# Patient Record
Sex: Female | Born: 1955 | Race: White | Hispanic: No | Marital: Married | Smoking: Never smoker
Health system: Southern US, Community
[De-identification: ages and names within clinical notes are randomized; demographics above are authoritative.]

## PROBLEM LIST (undated history)

## (undated) DIAGNOSIS — G4730 Sleep apnea, unspecified: Secondary | ICD-10-CM

## (undated) DIAGNOSIS — Q793 Gastroschisis: Secondary | ICD-10-CM

## (undated) DIAGNOSIS — R0600 Dyspnea, unspecified: Secondary | ICD-10-CM

## (undated) DIAGNOSIS — K56609 Unspecified intestinal obstruction, unspecified as to partial versus complete obstruction: Secondary | ICD-10-CM

## (undated) DIAGNOSIS — J8410 Pulmonary fibrosis, unspecified: Secondary | ICD-10-CM

## (undated) DIAGNOSIS — E669 Obesity, unspecified: Secondary | ICD-10-CM

## (undated) DIAGNOSIS — E785 Hyperlipidemia, unspecified: Secondary | ICD-10-CM

## (undated) DIAGNOSIS — M1990 Unspecified osteoarthritis, unspecified site: Secondary | ICD-10-CM

## (undated) DIAGNOSIS — K589 Irritable bowel syndrome without diarrhea: Secondary | ICD-10-CM

## (undated) DIAGNOSIS — F419 Anxiety disorder, unspecified: Secondary | ICD-10-CM

## (undated) DIAGNOSIS — E119 Type 2 diabetes mellitus without complications: Secondary | ICD-10-CM

## (undated) DIAGNOSIS — E039 Hypothyroidism, unspecified: Secondary | ICD-10-CM

## (undated) DIAGNOSIS — Z9989 Dependence on other enabling machines and devices: Secondary | ICD-10-CM

## (undated) DIAGNOSIS — I4891 Unspecified atrial fibrillation: Secondary | ICD-10-CM

## (undated) DIAGNOSIS — K219 Gastro-esophageal reflux disease without esophagitis: Secondary | ICD-10-CM

## (undated) DIAGNOSIS — J189 Pneumonia, unspecified organism: Secondary | ICD-10-CM

## (undated) DIAGNOSIS — G4733 Obstructive sleep apnea (adult) (pediatric): Secondary | ICD-10-CM

## (undated) DIAGNOSIS — I10 Essential (primary) hypertension: Secondary | ICD-10-CM

## (undated) DIAGNOSIS — Z9889 Other specified postprocedural states: Secondary | ICD-10-CM

## (undated) DIAGNOSIS — J45909 Unspecified asthma, uncomplicated: Secondary | ICD-10-CM

## (undated) DIAGNOSIS — R112 Nausea with vomiting, unspecified: Secondary | ICD-10-CM

## (undated) HISTORY — DX: Hyperlipidemia, unspecified: E78.5

## (undated) HISTORY — DX: Essential (primary) hypertension: I10

## (undated) HISTORY — DX: Obesity, unspecified: E66.9

## (undated) HISTORY — DX: Irritable bowel syndrome, unspecified: K58.9

## (undated) HISTORY — DX: Anxiety disorder, unspecified: F41.9

## (undated) HISTORY — DX: Type 2 diabetes mellitus without complications: E11.9

## (undated) HISTORY — DX: Unspecified intestinal obstruction, unspecified as to partial versus complete obstruction: K56.609

## (undated) HISTORY — PX: OTHER SURGICAL HISTORY: SHX169

## (undated) HISTORY — DX: Gastro-esophageal reflux disease without esophagitis: K21.9

## (undated) HISTORY — DX: Gastroschisis: Q79.3

## (undated) HISTORY — DX: Unspecified atrial fibrillation: I48.91

## (undated) HISTORY — DX: Pulmonary fibrosis, unspecified: J84.10

## (undated) HISTORY — DX: Sleep apnea, unspecified: G47.30

## (undated) HISTORY — DX: Morbid (severe) obesity due to excess calories: E66.01

---

## 1981-02-12 DIAGNOSIS — O321XX0 Maternal care for breech presentation, not applicable or unspecified: Secondary | ICD-10-CM

## 1986-02-12 HISTORY — PX: TUBAL LIGATION: SHX77

## 1990-02-12 HISTORY — PX: CHOLECYSTECTOMY: SHX55

## 2004-06-20 ENCOUNTER — Ambulatory Visit: Payer: Self-pay | Admitting: Obstetrics and Gynecology

## 2005-08-28 ENCOUNTER — Ambulatory Visit: Payer: Self-pay | Admitting: Obstetrics and Gynecology

## 2005-12-12 ENCOUNTER — Ambulatory Visit: Payer: Self-pay | Admitting: Endocrinology

## 2006-09-18 ENCOUNTER — Ambulatory Visit: Payer: Self-pay | Admitting: Obstetrics and Gynecology

## 2007-09-30 ENCOUNTER — Ambulatory Visit: Payer: Self-pay | Admitting: Obstetrics and Gynecology

## 2008-07-28 ENCOUNTER — Ambulatory Visit: Payer: Self-pay | Admitting: Internal Medicine

## 2008-09-23 LAB — HM DIABETES EYE EXAM: HM Diabetic Eye Exam: NORMAL

## 2008-11-25 ENCOUNTER — Ambulatory Visit: Payer: Self-pay | Admitting: Unknown Physician Specialty

## 2009-01-12 LAB — HM MAMMOGRAPHY: HM Mammogram: NORMAL

## 2009-02-12 HISTORY — PX: COLONOSCOPY: SHX174

## 2009-03-08 ENCOUNTER — Emergency Department: Payer: Self-pay | Admitting: Emergency Medicine

## 2009-03-09 ENCOUNTER — Inpatient Hospital Stay: Payer: Self-pay | Admitting: Student

## 2009-03-28 ENCOUNTER — Ambulatory Visit: Payer: Self-pay | Admitting: Internal Medicine

## 2009-03-28 ENCOUNTER — Encounter: Payer: Self-pay | Admitting: Internal Medicine

## 2009-04-08 ENCOUNTER — Ambulatory Visit: Payer: Self-pay | Admitting: Internal Medicine

## 2009-04-08 DIAGNOSIS — I10 Essential (primary) hypertension: Secondary | ICD-10-CM | POA: Insufficient documentation

## 2009-04-08 DIAGNOSIS — E118 Type 2 diabetes mellitus with unspecified complications: Secondary | ICD-10-CM | POA: Insufficient documentation

## 2009-04-08 DIAGNOSIS — J82 Pulmonary eosinophilia, not elsewhere classified: Secondary | ICD-10-CM

## 2009-04-08 DIAGNOSIS — E785 Hyperlipidemia, unspecified: Secondary | ICD-10-CM | POA: Insufficient documentation

## 2009-04-08 DIAGNOSIS — G4733 Obstructive sleep apnea (adult) (pediatric): Secondary | ICD-10-CM | POA: Insufficient documentation

## 2009-04-08 DIAGNOSIS — J8289 Other pulmonary eosinophilia, not elsewhere classified: Secondary | ICD-10-CM | POA: Insufficient documentation

## 2009-04-08 LAB — CONVERTED CEMR LAB
Basophils Absolute: 0 10*3/uL (ref 0.0–0.1)
Basophils Relative: 0 % (ref 0.0–3.0)
HCT: 40.3 % (ref 36.0–46.0)
Hemoglobin: 13.4 g/dL (ref 12.0–15.0)
Lymphocytes Relative: 38.5 % (ref 12.0–46.0)
Lymphs Abs: 3.2 10*3/uL (ref 0.7–4.0)
MCHC: 33.3 g/dL (ref 30.0–36.0)
Monocytes Relative: 7.1 % (ref 3.0–12.0)
Neutro Abs: 4.1 10*3/uL (ref 1.4–7.7)
Neutrophils Relative %: 50.9 % (ref 43.0–77.0)
RDW: 14.3 % (ref 11.5–14.6)
Sed Rate: 19 mm/hr (ref 0–22)

## 2009-04-22 ENCOUNTER — Ambulatory Visit: Payer: Self-pay | Admitting: Internal Medicine

## 2009-04-22 ENCOUNTER — Encounter: Payer: Self-pay | Admitting: Internal Medicine

## 2009-04-22 ENCOUNTER — Encounter (INDEPENDENT_AMBULATORY_CARE_PROVIDER_SITE_OTHER): Payer: Self-pay | Admitting: *Deleted

## 2009-04-29 ENCOUNTER — Telehealth (INDEPENDENT_AMBULATORY_CARE_PROVIDER_SITE_OTHER): Payer: Self-pay | Admitting: *Deleted

## 2009-05-17 ENCOUNTER — Ambulatory Visit: Payer: Self-pay | Admitting: Internal Medicine

## 2009-05-23 ENCOUNTER — Ambulatory Visit: Payer: Self-pay | Admitting: Gastroenterology

## 2009-05-27 ENCOUNTER — Emergency Department: Payer: Self-pay | Admitting: Unknown Physician Specialty

## 2009-06-16 ENCOUNTER — Ambulatory Visit: Payer: Self-pay | Admitting: Gastroenterology

## 2009-06-16 LAB — HM COLONOSCOPY

## 2009-06-20 ENCOUNTER — Telehealth: Payer: Self-pay | Admitting: Internal Medicine

## 2009-06-20 ENCOUNTER — Ambulatory Visit: Payer: Self-pay | Admitting: Internal Medicine

## 2009-06-20 ENCOUNTER — Ambulatory Visit: Payer: Self-pay | Admitting: Endocrinology

## 2009-06-20 DIAGNOSIS — F418 Other specified anxiety disorders: Secondary | ICD-10-CM | POA: Insufficient documentation

## 2009-06-20 DIAGNOSIS — E039 Hypothyroidism, unspecified: Secondary | ICD-10-CM | POA: Insufficient documentation

## 2009-06-20 LAB — CONVERTED CEMR LAB
ALT: 38 units/L — ABNORMAL HIGH (ref 0–35)
AST: 30 units/L (ref 0–37)
Alkaline Phosphatase: 62 units/L (ref 39–117)
BUN: 12 mg/dL (ref 6–23)
Bilirubin Urine: NEGATIVE
Bilirubin, Direct: 0.1 mg/dL (ref 0.0–0.3)
CO2: 32 meq/L (ref 19–32)
Cholesterol: 206 mg/dL — ABNORMAL HIGH (ref 0–200)
Creatinine,U: 274 mg/dL
Direct LDL: 139.2 mg/dL
HCT: 41.6 % (ref 36.0–46.0)
HDL: 39.3 mg/dL (ref >39.00)
Hgb A1c MFr Bld: 5.8 % (ref 4.6–6.5)
Lymphocytes Relative: 35.6 % (ref 12.0–46.0)
MCV: 89.8 fL (ref 78.0–100.0)
Microalb Creat Ratio: 0.9 mg/g (ref 0.0–30.0)
Microalb, Ur: 2.6 mg/dL — ABNORMAL HIGH (ref 0.0–1.9)
Monocytes Absolute: 0.4 10*3/uL (ref 0.1–1.0)
Monocytes Relative: 5 % (ref 3.0–12.0)
Neutrophils Relative %: 54.7 % (ref 43.0–77.0)
Platelets: 309 10*3/uL (ref 150.0–400.0)
Potassium: 3.7 meq/L (ref 3.5–5.1)
RBC: 4.63 M/uL (ref 3.87–5.11)
Saturation Ratios: 18.7 % — ABNORMAL LOW (ref 20.0–50.0)
Sodium: 145 meq/L (ref 135–145)
TSH: 1.7 microintl units/mL (ref 0.35–5.50)
Total Bilirubin: 0.6 mg/dL (ref 0.3–1.2)
Total Protein: 7 g/dL (ref 6.0–8.3)
Transferrin: 247.9 mg/dL (ref 212.0–360.0)
Triglycerides: 209 mg/dL — ABNORMAL HIGH (ref 0.0–149.0)
Urine Glucose: NEGATIVE mg/dL
Urobilinogen, UA: 0.2 (ref 0.0–1.0)
WBC: 8.5 10*3/uL (ref 4.5–10.5)

## 2009-06-21 ENCOUNTER — Telehealth: Payer: Self-pay | Admitting: Internal Medicine

## 2009-06-22 ENCOUNTER — Encounter (HOSPITAL_COMMUNITY): Admission: RE | Admit: 2009-06-22 | Discharge: 2009-09-20 | Payer: Self-pay | Admitting: Internal Medicine

## 2009-06-24 ENCOUNTER — Encounter: Payer: Self-pay | Admitting: Internal Medicine

## 2009-06-28 ENCOUNTER — Ambulatory Visit: Payer: Self-pay | Admitting: Internal Medicine

## 2009-06-28 DIAGNOSIS — E538 Deficiency of other specified B group vitamins: Secondary | ICD-10-CM | POA: Insufficient documentation

## 2009-07-12 ENCOUNTER — Ambulatory Visit: Payer: Self-pay | Admitting: Internal Medicine

## 2009-07-26 ENCOUNTER — Ambulatory Visit: Payer: Self-pay | Admitting: Internal Medicine

## 2009-07-28 ENCOUNTER — Ambulatory Visit: Payer: Self-pay | Admitting: Internal Medicine

## 2009-07-28 DIAGNOSIS — N390 Urinary tract infection, site not specified: Secondary | ICD-10-CM | POA: Insufficient documentation

## 2009-07-28 LAB — CONVERTED CEMR LAB
Glucose, Urine, Semiquant: NEGATIVE
Ketones, urine, test strip: NEGATIVE
Protein, U semiquant: 30
Specific Gravity, Urine: 1.015
pH: 6

## 2009-08-03 ENCOUNTER — Telehealth: Payer: Self-pay | Admitting: Internal Medicine

## 2009-08-08 ENCOUNTER — Telehealth: Payer: Self-pay | Admitting: Internal Medicine

## 2009-09-20 ENCOUNTER — Encounter: Payer: Self-pay | Admitting: Internal Medicine

## 2009-09-20 ENCOUNTER — Ambulatory Visit: Payer: Self-pay | Admitting: Internal Medicine

## 2009-09-21 ENCOUNTER — Telehealth: Payer: Self-pay | Admitting: Internal Medicine

## 2009-09-22 ENCOUNTER — Ambulatory Visit: Payer: Self-pay | Admitting: Internal Medicine

## 2009-09-29 ENCOUNTER — Ambulatory Visit: Payer: Self-pay | Admitting: Internal Medicine

## 2009-11-10 ENCOUNTER — Inpatient Hospital Stay: Payer: Self-pay | Admitting: Internal Medicine

## 2009-11-12 LAB — CONVERTED CEMR LAB
Basophils Relative: 0.4 %
Calcium: 9.3 mg/dL
Creatinine, Ser: 0.74 mg/dL
Glucose, Urine, Semiquant: 105
HCT: 37.6 %
Hemoglobin: 12.7 g/dL
Lymphocytes, automated: 34.5 %
Monocytes Relative: 6 %
RBC: 4.16 M/uL
RDW: 14.7 %

## 2009-11-21 ENCOUNTER — Ambulatory Visit: Payer: Self-pay | Admitting: Internal Medicine

## 2009-11-21 DIAGNOSIS — T502X5A Adverse effect of carbonic-anhydrase inhibitors, benzothiadiazides and other diuretics, initial encounter: Secondary | ICD-10-CM

## 2009-11-21 DIAGNOSIS — E876 Hypokalemia: Secondary | ICD-10-CM | POA: Insufficient documentation

## 2009-11-21 LAB — CONVERTED CEMR LAB
BUN: 11 mg/dL (ref 6–23)
CO2: 29 meq/L (ref 19–32)
Chloride: 103 meq/L (ref 96–112)
Glucose, Bld: 133 mg/dL — ABNORMAL HIGH (ref 70–99)
Hgb A1c MFr Bld: 6.4 % (ref 4.6–6.5)
Potassium: 3.5 meq/L (ref 3.5–5.1)
Sodium: 141 meq/L (ref 135–145)

## 2009-11-22 ENCOUNTER — Encounter: Payer: Self-pay | Admitting: Internal Medicine

## 2009-11-23 ENCOUNTER — Encounter: Payer: Self-pay | Admitting: Internal Medicine

## 2009-12-07 ENCOUNTER — Telehealth (INDEPENDENT_AMBULATORY_CARE_PROVIDER_SITE_OTHER): Payer: Self-pay | Admitting: *Deleted

## 2009-12-20 ENCOUNTER — Ambulatory Visit: Payer: Self-pay | Admitting: Internal Medicine

## 2009-12-20 LAB — CONVERTED CEMR LAB
Basophils Relative: 0.5 % (ref 0.0–3.0)
Bilirubin Urine: NEGATIVE
CO2: 33 meq/L — ABNORMAL HIGH (ref 19–32)
Calcium: 10 mg/dL (ref 8.4–10.5)
Eosinophils Absolute: 0.2 10*3/uL (ref 0.0–0.7)
Glucose, Bld: 121 mg/dL — ABNORMAL HIGH (ref 70–99)
Glucose, Urine, Semiquant: NEGATIVE
HCT: 38.4 % (ref 36.0–46.0)
Hemoglobin: 13.3 g/dL (ref 12.0–15.0)
Ketones, urine, test strip: NEGATIVE
Lymphocytes Relative: 40.8 % (ref 12.0–46.0)
Lymphs Abs: 3.1 10*3/uL (ref 0.7–4.0)
MCHC: 34.7 g/dL (ref 30.0–36.0)
Monocytes Relative: 7 % (ref 3.0–12.0)
Neutro Abs: 3.8 10*3/uL (ref 1.4–7.7)
Nitrite: NEGATIVE
Potassium: 3.9 meq/L (ref 3.5–5.1)
Protein, U semiquant: NEGATIVE
RBC: 4.31 M/uL (ref 3.87–5.11)
Sodium: 144 meq/L (ref 135–145)
Total Protein, Urine: NEGATIVE mg/dL
Urine Glucose: NEGATIVE mg/dL
pH: 6 (ref 5.0–8.0)

## 2009-12-21 ENCOUNTER — Telehealth (INDEPENDENT_AMBULATORY_CARE_PROVIDER_SITE_OTHER): Payer: Self-pay | Admitting: *Deleted

## 2009-12-22 ENCOUNTER — Encounter: Payer: Self-pay | Admitting: Internal Medicine

## 2010-01-09 ENCOUNTER — Telehealth: Payer: Self-pay | Admitting: Internal Medicine

## 2010-01-09 ENCOUNTER — Telehealth (INDEPENDENT_AMBULATORY_CARE_PROVIDER_SITE_OTHER): Payer: Self-pay | Admitting: *Deleted

## 2010-01-16 ENCOUNTER — Ambulatory Visit: Payer: Self-pay | Admitting: Specialist

## 2010-01-20 ENCOUNTER — Telehealth (INDEPENDENT_AMBULATORY_CARE_PROVIDER_SITE_OTHER): Payer: Self-pay | Admitting: *Deleted

## 2010-01-25 ENCOUNTER — Ambulatory Visit: Payer: Self-pay | Admitting: Unknown Physician Specialty

## 2010-02-02 ENCOUNTER — Encounter: Payer: Self-pay | Admitting: Internal Medicine

## 2010-02-02 LAB — CONVERTED CEMR LAB
ALT: 42 units/L
AST: 25 units/L
Alkaline Phosphatase: 65 units/L
Glucose, Bld: 181 mg/dL
Total Bilirubin: 0.6 mg/dL

## 2010-02-07 ENCOUNTER — Encounter: Payer: Self-pay | Admitting: Internal Medicine

## 2010-02-07 LAB — CONVERTED CEMR LAB
Eosinophils Relative: 2.7 %
Lymphocytes, automated: 38.3 %
Monocytes Relative: 8.3 %
Neutrophils Relative %: 50.4 %
RDW: 14.4 %
WBC: 7 10*3/uL

## 2010-02-14 ENCOUNTER — Encounter: Payer: Self-pay | Admitting: Internal Medicine

## 2010-03-06 ENCOUNTER — Encounter: Payer: Self-pay | Admitting: Internal Medicine

## 2010-03-16 NOTE — Assessment & Plan Note (Signed)
Summary: NEW ENDO CON/ [employer]/ DM AND THYROID/ SELF REFERRAL/NWS   Vital Signs:  Patient profile:   55 year old female Height:      67 inches (170.18 cm) Weight:      205.25 pounds (93.30 kg) O2 Sat:      93 % on Room air Temp:     98.3 degrees F (36.83 degrees C) oral Pulse rate:   82 / minute BP sitting:   128 / 70  (left arm) Cuff size:   regular  Vitals Entered By: Gardenia Phlegm RMA (Jun 20, 2009 9:32 AM)  O2 Flow:  Room air CC: New Endo: Diabetes and Thyroid/ CF Is Patient Diabetic? Yes   Referring Provider:  Self Primary Provider:  none  CC:  New Endo: Diabetes and Thyroid/ CF.  History of Present Illness: pt states 2 years h/o dm.  she denies knowing of any chronic complications.  she has never been on insulin.  she takes metformin.  she checks cbg's less frequently now, but when she has checked, it is well-controlled.  pt says her diet and exercise are "fair."   symptomatically, pt states 1 month of "excessive" hair loss throughout the head.  no associated rash. she has been on synthroid x approx 15 years.     Current Medications (verified): 1)  Synthroid 75 Mcg Tabs (Levothyroxine Sodium) .Marland Kitchen.. 1 Once Daily 2)  Paxil Cr 25 Mg Xr24h-Tab (Paroxetine Hcl) .Marland Kitchen.. 1 Two Times A Day 3)  Singulair 10 Mg Tabs (Montelukast Sodium) .Marland Kitchen.. 1 Once Daily 4)  Metformin Hcl 500 Mg Tabs (Metformin Hcl) .Marland Kitchen.. 1 Two Times A Day 5)  Hydrochlorothiazide 25 Mg Tabs (Hydrochlorothiazide) .Marland Kitchen.. 1 Once Daily 6)  Xanax 0.25 Mg Tabs (Alprazolam) .Marland Kitchen.. 1 Once Daily As Needed 7)  Ambien 10 Mg Tabs (Zolpidem Tartrate) .Marland Kitchen.. 1 At Bedtime As Needed 8)  Pepcid 20 Mg Tabs (Famotidine) .Marland Kitchen.. 1 At Bedtime 9)  Prilosec Otc 20 Mg Tbec (Omeprazole Magnesium) .... Take  One 30-60 Min Before First Meal of The Day 10)  Wellbutrin Xl 150 Mg Xr24h-Tab (Bupropion Hcl) .... One By Mouth Once Daily  Allergies (verified): 1)  ! Pcn 2)  ! Sulfa  Past History:  Past Medical History: Last updated:  05/23/2009 Diabetes, Type 2 Hypertension Sleep Apnea Hyperlipidemia Pulmonary Fibrosis after Acute Lung Injury................................Marland KitchenWert      - ? CAP  02/2009    - CT chest [place] 03/28/09 c/w PF with baseline cxr nl 03/09/09    - f/u PFT's rec April 22, 2009  Born with [employer], rotated organs abdominal repair until age 45 Anxiety Disorder GERD Irritable Bowel Syndrome Obesity Small Bowel Obstruction  Family History: Reviewed history from 05/23/2009 and no changes required. No FH of Colon Cancer: Lymphoma: Mother Family History of Diabetes: Father, Son no goiter or other thyroid probs type 2 dm: father type 1 dm:  son  Social History: Reviewed history from 05/23/2009 and no changes required. 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 "Tried as a teen". Social ETOH Daily Caffeine Use 2  Review of Systems       The patient complains of depression.         denies blurry vision, headache, n/v, urinary frequency, excessive diaphoresis, depression, menopausal sxs, rhinorrhea, and easy bruising.  she has doe, and leg cramps. she has lost 15 lbs x 4 months, due to a viral illness (hosp x 11 days).  Physical Exam  General:  obese.  no  distress  Head:  head: no deformity eyes: no periorbital swelling, no proptosis external nose and ears are normal mouth: no lesion seen hair on the head is mildly thin Neck:  Supple without thyroid enlargement or tenderness.  Lungs:  Clear to auscultation bilaterally. Normal respiratory effort.  Heart:  Regular rate and rhythm without murmurs or gallops noted. Normal S1,S2.   Abdomen:  abdomen is soft, nontender.  no hepatosplenomegaly.   not distended.  no hernia  Msk:  muscle bulk and strength are grossly normal.  no obvious joint swelling.  gait is normal and steady  Pulses:  dorsalis pedis intact bilat.  no carotid bruit  Extremities:  no deformity.  no ulcer on the feet.   feet are of normal color and temp.  no edema  Neurologic:  cn 2-12 grossly intact.   readily moves all 4's.   sensation is intact to touch on the feet  Skin:  normal texture and temp.  no rash.  not diaphoretic  Cervical Nodes:  No significant adenopathy.  Psych:  Alert and cooperative; normal mood and affect; normal attention span and concentration.   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  please let me know if you wish to be referred to a dietician.  high blood sugar is very risky to your health.  you should see an eye doctor every year. 2)  controlling your blood pressure and cholesterol drastically reduces the damage diabetes does to your body.  this also applies to quitting smoking.  please discuss these with your doctor.  you should take an aspirin every day, unless you have been advised by a doctor not to. 3)  check your blood sugar 1 time a day.  vary the time of day when you check, between before the 3 meals, and at bedtime.  also check if you have symptoms of your blood sugar being too high or too low.  please keep a record of the readings and bring it to your next appointment here.  please call us sooner if you are having low blood sugar episodes. 4)  pending the test results, please continue the same medications for now, and return here in 6 months

## 2010-03-16 NOTE — Assessment & Plan Note (Signed)
Summary: abdominal pain--ch.   History of Present Illness Visit Type: Initial Consult Primary GI MD: Erskine Emery MD [facility] Primary Provider: none Requesting Provider: Christinia Gully, MD Chief Complaint: colon screening due to previous abdominal pain & current intermittant pain History of Present Illness:   Kirsten [name] is a pleasant 55 year old white female referred at the request of Dr. Melvyn Novas for colonoscopy.  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  Since that time she has had occasional postprandial upper abdominal discomfort.  She has no GI complaints including change in bowel habits, melena or hematochezia.  She was recently placed on aciphex and Pepcid for a cough,  with subsequent improvement.   GI Review of Systems    Reports abdominal pain and  bloating.     Location of  Abdominal pain: generalized.    Denies acid reflux, belching, chest pain, dysphagia with liquids, dysphagia with solids, heartburn, loss of appetite, nausea, vomiting, vomiting blood, weight loss, and  weight gain.      Reports change in bowel habits, constipation, diarrhea, and  rectal bleeding.     Denies anal fissure, black tarry stools, diverticulosis, fecal incontinence, heme positive stool, hemorrhoids, irritable bowel syndrome, jaundice, light color stool, liver problems, and  rectal pain.    Current Medications (verified): 1)  Synthroid 75 Mcg Tabs (Levothyroxine Sodium) .Marland Kitchen.. 1 Once Daily 2)  Paxil Cr 25 Mg Xr24h-Tab (Paroxetine Hcl) .Marland Kitchen.. 1 Two Times A Day 3)  Singulair 10 Mg Tabs (Montelukast Sodium) .Marland Kitchen.. 1 Once Daily 4)  Metformin Hcl 500 Mg Tabs (Metformin Hcl) .Marland Kitchen.. 1 Two Times A Day 5)  Hydrochlorothiazide 25 Mg Tabs (Hydrochlorothiazide) .Marland Kitchen.. 1 Once Daily 6)  Xopenex Hfa 45 Mcg/act Aero (Levalbuterol Tartrate) .... 2 Puffs Every 6 Hours If Needed 7)  Xanax 0.25 Mg Tabs (Alprazolam) .Marland Kitchen.. 1 Once Daily As Needed 8)  Ambien 10 Mg  Tabs (Zolpidem Tartrate) .Marland Kitchen.. 1 At Bedtime As Needed 9)  Pepcid 20 Mg Tabs (Famotidine) .Marland Kitchen.. 1 At Bedtime 10)  Prilosec Otc 20 Mg Tbec (Omeprazole Magnesium) .... Take  One 30-60 Min Before First Meal of The Day 11)  Doxycycline Hyclate 100 Mg Caps (Doxycycline Hyclate) .... One Twice Daily Before Eating With Glass of Water 12)  Prednisone 10 Mg  Tabs (Prednisone) .... 4 Each Am X 2days, 2x2days, 1x2days and Stop  Allergies (verified): 1)  ! Pcn 2)  ! Sulfa  Past History:  Past Medical History: Diabetes, Type 2 Hypertension Sleep Apnea Hyperlipidemia Pulmonary Fibrosis after Acute Lung Injury................................Marland KitchenWert      - ? 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 Social ETOH Daily Caffeine Use 2  Review of Systems       The patient complains of allergy/sinus, anxiety-new, cough, depression-new, fatigue, shortness of breath, sleeping problems, and sore throat.  The patient denies anemia, arthritis/joint pain, back pain, blood in urine, breast changes/lumps,  change in vision, confusion, coughing up blood, fainting, fever, headaches-new, hearing problems, heart murmur, heart rhythm changes, itching, menstrual pain, muscle pains/cramps, night sweats, nosebleeds, pregnancy symptoms, skin rash, swelling of feet/legs, swollen lymph glands, thirst - excessive , urination - excessive , urination  changes/pain, urine leakage, vision changes, and voice change.         All other systems were reviewed and were negative   Vital Signs:  Patient profile:   55 year old female Height:      67 inches Weight:      206.13 pounds BMI:     32.40 Pulse rate:   68 / minute Pulse rhythm:   regular BP sitting:   110 / 76  (left arm) Cuff size:   regular  Vitals Entered By: June McMurray CMA Deborra Medina) (May 23, 2009 10:58 AM)  Physical Exam  Additional Exam:  On physical exam she is a well-developed well-nourished female  skin: anicteric HEENT: normocephalic; PEERLA; no nasal or pharyngeal abnormalities neck: supple nodes: no cervical lymphadenopathy chest: clear to ausculatation and percussion heart: no murmurs, gallops, or rubs abd: soft, nontender; BS normoactive; abdomen is soft with multiple soft masses and possible herniations consistent with her previous surgery.  There is no organomegaly rectal: deferred ext: no cynanosis, clubbing, edema skeletal: no deformities neuro: oriented x 3; no focal abnormalities    Impression & Recommendations:  Problem # 1:  ABDOMINAL PAIN, UNSPECIFIED (ICD-789.00) Pain  is quite nonspecific and probably related to her altered anatomy.  It is unlikely that she has an active abdominal process or active peptic ulcer disease.  Conditions #1 trial of  hyomax prn  Problem # 2:  SPECIAL SCREENING FOR MALIGNANT NEOPLASMS COLON (ICD-V76.51)  Patient will be scheduled for screening colonoscopy.  Particular caution will be taken in view of her possible altered GI anatomy.  Risks, alternatives, and complications of the procedure, including bleeding, perforation, and possible need for surgery, were explained to the patient.  Patient's questions were answered.  Orders: Colonoscopy (Colon)  Problem # 3:  SLEEP APNEA (ICD-780.57) Assessment: Comment Only  Problem # 4:  DIABETES, TYPE 2 (ICD-250.00) Assessment: Comment Only  Patient Instructions: 1)   You Colonoscopy is scheduled for 06/16/2009 please arrive at 7:30 am on the 4th floor of the [facility].  2)  Copy sent to : Christinia Gully, MD 3)  The medication list was reviewed and reconciled.  All changed / newly prescribed medications were explained.  A complete medication list was provided to the patient / caregiver. 4)  Colonoscopy and Flexible Sigmoidoscopy brochure given.  5)  Conscious Sedation brochure given.  Prescriptions: HYOMAX-SL 0.125 MG SUBL (HYOSCYAMINE SULFATE) take 2 tabs sublingual q.4 h. p.r.n. abdominal pain  #15 x 2   Entered and Authorized by:   Inda Castle MD   Signed by:   Inda Castle MD on 05/23/2009   Method used:   Electronically to        [facility]. [address] [phone]* (retail)       [address] [place], [place]  [id number]       Ph: [phone]       Fax: [phone]   RxID:   [id number] MOVIPREP 100 GM  SOLR (PEG-KCL-NACL-NASULF-NA ASC-C) As per prep instructions.  #1 x 0   Entered by:   Bernita Buffy CMA ([place])   Authorized by:   Inda Castle MD   Signed by:   Mearl Latin  Kowalk CMA ([place]) on 05/23/2009   Method used:   Electronically to        [facility]. [address] [phone] (retail)       [address] [place], [place]  [id number]       Ph: [phone]       Fax: [phone]   [place]:   [id number]

## 2010-03-16 NOTE — Progress Notes (Signed)
  Phone Note Other Incoming   Request: Send information Summary of Call: Request received from [facility] forwarded to [place].  Marland Kitchen

## 2010-03-16 NOTE — Letter (Signed)
Summary: Results Follow-up Letter  [facility] [address]   [place], [place] [address]   Phone: [phone]  Fax: [phone]    06/24/2009  [place], [place]  [address]  Dear Ms. Gillespie,   The following are the results of your recent test(s):  Test     Result     B12 level      low Iron level     normal Blood sugars   good average Kidney     normal Liver       one, slightly elevated enzyme  CBC       normal Thyroid     normal Urine       normal   _________________________________________________________  Please call for an appointment soon _________________________________________________________ _________________________________________________________ _________________________________________________________  Sincerely,  [name] MD [facility]

## 2010-03-16 NOTE — Progress Notes (Signed)
  Phone Note Other Incoming   Request: Send information Summary of Call: Request for records received from [facility]. Request forwarded to [employer].

## 2010-03-16 NOTE — Letter (Signed)
Summary: New Patient letter  [facility]  [address] [place], [place] [address]   Phone: [phone]  Fax: [phone]       04/22/2009 MRN: [id number]  [facility] [address] [place], [place]  [address]  Dear Ms. Fudala,  Welcome to the Gastroenterology Division at [facility].    You are scheduled to see Dr.  Deatra Ina on 05-23-09 at 11:00a.m. on the 3rd floor at [facility] [place] [employer] [facility].  We ask that you try to arrive at our office 15 minutes prior to your appointment time to allow for check-in.  We would like you to complete the enclosed self-administered evaluation form prior to your visit and bring it with you on the day of your appointment.  We will review it with you.  Also, please bring a complete list of all your medications or, if you prefer, bring the medication bottles and we will list them.  Please bring your insurance card so that we may make a copy of it.  If your insurance requires a referral to see a specialist, please bring your referral form from your primary care physician.  Co-payments are due at the time of your visit and may be paid by cash, check or credit card.     Your office visit will consist of a consult with your physician (includes a physical exam), any laboratory testing he/she may order, scheduling of any necessary diagnostic testing (e.g. x-ray, ultrasound, CT-scan), and scheduling of a procedure (e.g. Endoscopy, Colonoscopy) if required.  Please allow enough time on your schedule to allow for any/all of these possibilities.    If you cannot keep your appointment, please call [phone] to cancel or reschedule prior to your appointment date.  This allows [place] the opportunity to schedule an appointment for another patient in need of care.  If you do not cancel or reschedule by 5 p.m. the business day prior to your appointment date, you will be charged a $50.00 late cancellation/no-show fee.    Thank you for choosing [facility] for your medical needs.  We appreciate the opportunity to care for you.  Please visit [place] at our website  to learn more about our practice.                     Sincerely,                                                             The Gastroenterology Division

## 2010-03-16 NOTE — Assessment & Plan Note (Signed)
Summary: UTI? /NWS   Vital Signs:  Patient profile:   55 year old female Menstrual status:  postmenopausal Height:      67 inches Weight:      204 pounds BMI:     32.07 O2 Sat:      95 % on Room air Temp:     98.4 degrees F oral Pulse rate:   100 / minute Pulse rhythm:   regular Resp:     16 per minute BP sitting:   110 / 70  (left arm) Cuff size:   large  Vitals Entered By: Estell Harpin CMA (July 28, 2009 3:29 PM)  Nutrition Counseling: Patient's BMI is greater than 25 and therefore counseled on weight management options.  O2 Flow:  Room air  Primary Care Provider:  [name] MD  CC:  Dysuria.  History of Present Illness:  Dysuria      This is a 55 year old woman who presents with Dysuria.  The symptoms began 3 days ago.  The intensity is described as mild.  The patient reports burning with urination, urinary frequency, and urgency, but denies hematuria, vaginal discharge, vaginal itching, and vaginal sores.  The patient denies the following associated symptoms: nausea, vomiting, fever, shaking chills, flank pain, abdominal pain, back pain, and pelvic pain.  The patient denies the following risk factors: prior antibiotics, immunosuppression, history of GU anomaly, history of pyelonephritis, and history of STD.    Preventive Screening-Counseling & Management  Alcohol-Tobacco     Alcohol drinks/day: 0     Smoking Status: never  Hep-HIV-STD-Contraception     Hepatitis Risk: no risk noted     HIV Risk: no risk noted     STD Risk: no risk noted      Sexual History:  currently monogamous.        Drug Use:  never.        Blood Transfusions:  no.    Clinical Review Panels:  Diabetes Management   HgBA1C:  5.8 (06/20/2009)   Creatinine:  0.6 (06/20/2009)   Last Dilated Eye Exam:  normal (09/23/2008)   Last Foot Exam:  yes (06/20/2009)  CBC   WBC:  8.5 (06/20/2009)   RBC:  4.63 (06/20/2009)   Hgb:  14.4 (06/20/2009)   Hct:  41.6 (06/20/2009)   Platelets:   309.0 (06/20/2009)   MCV  89.8 (06/20/2009)   MCHC  34.6 (06/20/2009)   RDW  14.5 (06/20/2009)   PMN:  54.7 (06/20/2009)   Lymphs:  35.6 (06/20/2009)   Monos:  5.0 (06/20/2009)   Eosinophils:  2.5 (06/20/2009)   Basophil:  2.2 (06/20/2009)  Complete Metabolic Panel   Glucose:  134 (06/20/2009)   Sodium:  145 (06/20/2009)   Potassium:  3.7 (06/20/2009)   Chloride:  103 (06/20/2009)   CO2:  32 (06/20/2009)   BUN:  12 (06/20/2009)   Creatinine:  0.6 (06/20/2009)   Albumin:  4.3 (06/20/2009)   Total Protein:  7.0 (06/20/2009)   Calcium:  10.3 (06/20/2009)   Total Bili:  0.6 (06/20/2009)   Alk Phos:  62 (06/20/2009)   SGPT (ALT):  38 (06/20/2009)   SGOT (AST):  30 (06/20/2009)   Medications Prior to Update: 1)  Synthroid 75 Mcg Tabs (Levothyroxine Sodium) .Marland Kitchen.. 1 Once Daily 2)  Paxil Cr 25 Mg Xr24h-Tab (Paroxetine Hcl) .Marland Kitchen.. 1 Two Times A Day 3)  Singulair 10 Mg Tabs (Montelukast Sodium) .Marland Kitchen.. 1 Once Daily 4)  Metformin Hcl 500 Mg Tabs (Metformin Hcl) .Marland Kitchen.. 1 Two  Times A Day 5)  Hydrochlorothiazide 25 Mg Tabs (Hydrochlorothiazide) .Marland Kitchen.. 1 Once Daily 6)  Xanax 0.25 Mg Tabs (Alprazolam) .Marland Kitchen.. 1 Once Daily As Needed 7)  Ambien 10 Mg Tabs (Zolpidem Tartrate) .Marland Kitchen.. 1 At Bedtime As Needed 8)  Pepcid 20 Mg Tabs (Famotidine) .Marland Kitchen.. 1 At Bedtime 9)  Prilosec Otc 20 Mg Tbec (Omeprazole Magnesium) .... Take  One 30-60 Min Before First Meal of The Day 10)  Wellbutrin Xl 150 Mg Xr24h-Tab (Bupropion Hcl) .... One By Mouth Once Daily  Current Medications (verified): 1)  Synthroid 75 Mcg Tabs (Levothyroxine Sodium) .Marland Kitchen.. 1 Once Daily 2)  Paxil Cr 25 Mg Xr24h-Tab (Paroxetine Hcl) .Marland Kitchen.. 1 Two Times A Day 3)  Singulair 10 Mg Tabs (Montelukast Sodium) .Marland Kitchen.. 1 Once Daily 4)  Metformin Hcl 500 Mg Tabs (Metformin Hcl) .Marland Kitchen.. 1 Two Times A Day 5)  Hydrochlorothiazide 25 Mg Tabs (Hydrochlorothiazide) .Marland Kitchen.. 1 Once Daily 6)  Xanax 0.25 Mg Tabs (Alprazolam) .Marland Kitchen.. 1 Once Daily As Needed 7)  Ambien 10 Mg Tabs (Zolpidem  Tartrate) .Marland Kitchen.. 1 At Bedtime As Needed 8)  Pepcid 20 Mg Tabs (Famotidine) .Marland Kitchen.. 1 At Bedtime 9)  Prilosec Otc 20 Mg Tbec (Omeprazole Magnesium) .... Take  One 30-60 Min Before First Meal of The Day 10)  Macrodantin 100 Mg Cap (Nitrofurantoin Macrocrystal) .... Take 1 Capsule By Mouth Am & Pm 11)  Pyridium 200 Mg Tabs (Phenazopyridine Hcl) .... One By Mouth Three Times A Day As Needed For Bladder Pain  Allergies (verified): 1)  ! Pcn 2)  ! Sulfa  Past History:  Past Medical History: Last updated: 05/23/2009 Diabetes, Type 2 Hypertension Sleep Apnea Hyperlipidemia Pulmonary Fibrosis after Acute Lung Injury................................Marland KitchenWert      - ? 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 "Tried as a teen". Social ETOH Daily Caffeine Use 2 Regular exercise-yes  Risk Factors: Alcohol Use: 0 (07/28/2009) Exercise: yes (06/20/2009)  Risk Factors: Smoking Status: never (07/28/2009)  Family History: Reviewed history from 06/20/2009 and no changes required. No FH of Colon Cancer: Lymphoma: Mother Family History of Diabetes: Father, Son no goiter or other thyroid probs type 2 dm: father type 1 dm:  son  Social History: Reviewed history from 06/20/2009 and no changes  required. 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 Social ETOH Daily Caffeine Use 2 Regular exercise-yes  Review of Systems  The patient denies anorexia, fever, chest pain, abdominal pain, hematuria, and enlarged lymph nodes.    Physical Exam  General:  alert, well-developed, well-nourished, well-hydrated, appropriate dress, normal appearance, healthy-appearing, cooperative to examination, good hygiene, and overweight-appearing.   Neck:  supple, full ROM, no masses, no thyromegaly, no thyroid nodules or tenderness, no JVD, normal carotid upstroke, no carotid bruits, no cervical lymphadenopathy, and no neck tenderness.   Lungs:  normal respiratory effort, no intercostal retractions, no accessory muscle use, normal breath sounds, no dullness, no fremitus, no crackles, and no wheezes.   Heart:  normal rate, regular rhythm, no murmur, no gallop, no rub, and no JVD.   Abdomen:  soft, non-tender, normal bowel sounds, no distention, no masses, no guarding, no rigidity, no rebound tenderness, no abdominal hernia, no inguinal hernia, no hepatomegaly, no splenomegaly, and abdominal scar(s).   Msk:  normal ROM, no joint tenderness, no joint swelling, no joint warmth, no redness over joints, no joint deformities, no joint instability, and no crepitation.   Psych:  Oriented X3, memory intact for recent and remote, good eye contact, not anxious appearing, not depressed appearing, and subdued.     Impression & Recommendations:  Problem # 1:  UTI (ICD-599.0) Assessment New  Her updated medication list for this problem includes:    Macrodantin 100 Mg Cap (Nitrofurantoin macrocrystal) .Marland Kitchen... Take 1 capsule by mouth am & pm  Encouraged to push clear liquids, get enough rest, and take acetaminophen as needed. To be seen in 10 days if no improvement, sooner if worse.  Complete Medication List: 1)  Synthroid 75 Mcg Tabs (Levothyroxine sodium) .Marland Kitchen.. 1  once daily 2)  Paxil Cr 25 Mg Xr24h-tab (Paroxetine hcl) .Marland Kitchen.. 1 two times a day 3)  Singulair 10 Mg Tabs (Montelukast sodium) .Marland Kitchen.. 1 once daily 4)  Metformin Hcl 500 Mg Tabs (Metformin hcl) .Marland Kitchen.. 1 two times a day 5)  Hydrochlorothiazide 25 Mg Tabs (Hydrochlorothiazide) .Marland Kitchen.. 1 once daily 6)  Xanax 0.25 Mg Tabs (Alprazolam) .Marland Kitchen.. 1 once daily as needed 7)  Ambien 10 Mg Tabs (Zolpidem tartrate) .Marland Kitchen.. 1 at bedtime as needed 8)  Pepcid 20 Mg Tabs (Famotidine) .Marland Kitchen.. 1 at bedtime 9)  Prilosec Otc 20 Mg Tbec (Omeprazole magnesium) .... Take  one 30-60 min before first meal of the day 10)  Macrodantin 100 Mg Cap (Nitrofurantoin macrocrystal) .... Take 1 capsule by mouth am & pm 11)  Pyridium 200 Mg Tabs (Phenazopyridine hcl) .... One by mouth three times a day as needed for bladder pain  Other Orders: UA Dipstick w/o Micro (manual) (61443)  Patient Instructions: 1)  Please schedule a follow-up appointment in 1 month. 2)  Take your antibiotic as prescribed until ALL of it is gone, but stop if you develop a rash or swelling and contact our office as soon as possible. Prescriptions: PYRIDIUM 200 MG TABS (PHENAZOPYRIDINE HCL) One by mouth three times a day as needed for bladder pain  #25 x 0   Entered and Authorized by:   [name] MD   Signed by:   [name] MD on 07/28/2009   Method used:   Electronically to        [facility]. [address] [phone]* (retail)       [place], [place]  [id number]       Ph: [phone]       Fax: [phone]   RxID:   [id number] MACRODANTIN 100 MG CAP (NITROFURANTOIN MACROCRYSTAL) Take 1 capsule by mouth AM & PM  #20 x 0   Entered and Authorized by:   [name] MD   Signed by:   [name] MD on 07/28/2009   Method used:   Electronically to        [facility]. [facility] [phone]* (retail)       [place].  [place], [place]  [id number]       Ph: [phone]       Fax: [phone]   RxID:    [id number]     July 28, 2009   [facility] [address] [place], [place] [address]  RE:  LAB RESULTS  Dear  Ms. Sendejo,  The following is an interpretation of your most recent lab tests.  Please take note of any instructions provided or changes to medications that have resulted from your lab work.      Medications Prescribed or Changed MACRODANTIN 100 MG CAP (NITROFURANTOIN MACROCRYSTAL) Take 1 capsule by mouth AM & PM PYRIDIUM 200 MG TABS (PHENAZOPYRIDINE HCL) One by mouth three times a day as needed for bladder pain   Medications Discontinued  WELLBUTRIN XL 150 MG XR24H-TAB (BUPROPION HCL) One by mouth once daily   Laboratory Results   Urine Tests  Date/Time Received: Estell Harpin CMA  July 28, 2009 3:32 PM   Routine Urinalysis   Color: orange Appearance: Hazy Glucose: negative   (Normal Range: Negative) Bilirubin: negative   (Normal Range: Negative) Ketone: negative   (Normal Range: Negative) Spec. Gravity: 1.015   (Normal Range: 1.003-1.035) Blood: large   (Normal Range: Negative) pH: 6.0   (Normal Range: 5.0-8.0) Protein: 30   (Normal Range: Negative) Urobilinogen: 0.2   (Normal Range: 0-1) Nitrite: positive   (Normal Range: Negative) Leukocyte Esterace: moderate   (Normal Range: Negative)

## 2010-03-16 NOTE — Letter (Signed)
Summary: [facility] Instructions  [facility]  [place], [place] [address]   Phone: [phone]  Fax: [phone]       PATRICIA FARGO    1955-03-14    MRN: [id number]        Procedure Day /Date: 5/5/52011 Thursday     Arrival Time: 7:30am     Procedure Time: 8:00am     Location of Procedure:                    X   [place] (4th Floor)                      [place]   Starting 5 days prior to your procedure 06/11/2009 do not eat nuts, seeds, popcorn, corn, beans, peas,  salads, or any raw vegetables.  Do not take any fiber supplements (e.g. Metamucil, Citrucel, and Benefiber).  THE DAY BEFORE YOUR PROCEDURE         DATE: 06/15/2009  DAY: Wednesday   1.  Drink clear liquids the entire day-NO SOLID FOOD  2.  Do not drink anything colored red or purple.  Avoid juices with pulp.  No orange juice.  3.  Drink at least 64 oz. (8 glasses) of fluid/clear liquids during the day to prevent dehydration and help the prep work efficiently.  CLEAR LIQUIDS INCLUDE: Water Jello Ice Popsicles Tea (sugar ok, no milk/cream) Powdered fruit flavored drinks Coffee (sugar ok, no milk/cream) Gatorade Juice: apple, white grape, white cranberry  Lemonade Clear bullion, consomm, broth Carbonated beverages (any kind) Strained chicken noodle soup Hard Candy                             4.  In the morning, mix first dose of MoviPrep solution:    Empty 1 Pouch A and 1 Pouch B into the disposable container    Add lukewarm drinking water to the top line of the container. Mix to dissolve    Refrigerate (mixed solution should be used within 24 hrs)  5.  Begin drinking the prep at 5:00 p.m. The MoviPrep container is divided by 4 marks.   Every 15 minutes drink the solution down to the next mark (approximately 8 oz) until the full liter is complete.   6.  Follow completed prep with 16 oz of clear liquid of your choice  (Nothing red or purple).  Continue to drink clear liquids until bedtime.  7.  Before going to bed, mix second dose of MoviPrep solution:    Empty 1 Pouch A and 1 Pouch B into the disposable container    Add lukewarm drinking water to the top line of the container. Mix to dissolve    Refrigerate  THE DAY OF YOUR PROCEDURE      DATE: 06/16/2009 DAY: Thursday  Beginning at 3:00am (5 hours before procedure):         1. Every 15 minutes, drink the solution down to the next mark (approx 8 oz) until the full liter is complete.  2. Follow completed prep with 16 oz. of clear liquid of your choice.    3. You may drink clear liquids until 6:00am (2 HOURS BEFORE PROCEDURE).   MEDICATION INSTRUCTIONS  Unless otherwise instructed, you should take regular prescription medications with a small sip of water   as early as possible the morning of your procedure.  Please follow  the diabetic instruction sheet provided       OTHER INSTRUCTIONS  You will need a responsible adult at least 53 years of age to accompany you and drive you home.   This person must remain in the waiting room during your procedure.  Wear loose fitting clothing that is easily removed.  Leave jewelry and other valuables at home.  However, you may wish to bring a book to read or  an iPod/MP3 player to listen to music as you wait for your procedure to start.  Remove all body piercing jewelry and leave at home.  Total time from sign-in until discharge is approximately 2-3 hours.  You should go home directly after your procedure and rest.  You can resume normal activities the  day after your procedure.  The day of your procedure you should not:   Drive   Make legal decisions   Operate machinery   Drink alcohol   Return to work  You will receive specific instructions about eating, activities and medications before you leave.    The above instructions have been reviewed and explained to me by    _______________________    I fully understand and can verbalize these instructions _____________________________ Date _________

## 2010-03-16 NOTE — Assessment & Plan Note (Signed)
Summary: f/u appt/#/cd   Vital Signs:  Patient profile:   55 year old female Menstrual status:  postmenopausal Height:      67 inches Weight:      210 pounds BMI:     33.01 O2 Sat:      96 % on Room air Temp:     98.9 degrees F oral Pulse rate:   97 / minute Pulse rhythm:   regular Resp:     16 per minute BP sitting:   128 / 86  (left arm) Cuff size:   large  Vitals Entered By: Vilas (November 21, 2009 3:12 PM)  Nutrition Counseling: Patient's BMI is greater than 25 and therefore counseled on weight management options.  O2 Flow:  Room air CC: hospital follow up// discuss b12, Hypertension Management Is Patient Diabetic? Yes Did you bring your meter with you today? No Pain Assessment Patient in pain? no       Does patient need assistance? Functional Status Self care Ambulation Normal   Primary Care Provider:  [name] MD  CC:  hospital follow up// discuss b12 and Hypertension Management.  History of Present Illness: She returns for f/up and informs me that she was in the hospital on IV antibiotcs for an E. coli UTI that was drug resistant. She has a PICC line in her right arm and is receiving ? IV antibiotic. She feels much better and wants to go back to work in one week. She is being seen daily by [facility]. She thinks her potassium level was low in the hospital.  Hypertension History:      She denies headache, chest pain, palpitations, dyspnea with exertion, orthopnea, PND, peripheral edema, visual symptoms, neurologic problems, syncope, and side effects from treatment.  She notes no problems with any antihypertensive medication side effects.        Positive major cardiovascular risk factors include diabetes, hyperlipidemia, and hypertension.  Negative major cardiovascular risk factors include female age less than 86 years old, negative family history for ischemic heart disease, and non-tobacco-user status.        Further assessment for target organ damage  reveals no history of ASHD, cardiac end-organ damage (CHF/LVH), stroke/TIA, peripheral vascular disease, renal insufficiency, or hypertensive retinopathy.     Preventive Screening-Counseling & Management  Alcohol-Tobacco     Alcohol drinks/day: 0     Smoking Status: never     Tobacco Counseling: not indicated; no tobacco use  Hep-HIV-STD-Contraception     Hepatitis Risk: no risk noted     HIV Risk: no risk noted     STD Risk: no risk noted      Sexual History:  currently monogamous.        Drug Use:  never.        Blood Transfusions:  no.    Clinical Review Panels:  Prevention   Last Mammogram:  normal (01/12/2009)   Last Pap Smear:  normal (01/12/2009)   Last Colonoscopy:  DONE (06/16/2009)  Lipid Management   Cholesterol:  206 (06/20/2009)   HDL (good cholesterol):  39.30 (06/20/2009)  Diabetes Management   HgBA1C:  5.8 (06/20/2009)   Creatinine:  0.74 (11/12/2009)   Last Dilated Eye Exam:  normal (09/23/2008)   Last Foot Exam:  yes (11/21/2009)  CBC   WBC:  8.3 (11/12/2009)   RBC:  4.16 (11/12/2009)   Hgb:  12.7 (11/12/2009)   Hct:  37.6 (11/12/2009)   Platelets:  239 (11/12/2009)   MCV  90 (11/12/2009)  MCHC  34.6 (06/20/2009)   RDW  14.7 (11/12/2009)   PMN:  56.4 (11/12/2009)   Lymphs:  35.6 (06/20/2009)   Monos:  6.0 (11/12/2009)   Eosinophils:  2.7 (11/12/2009)   Basophil:  0.4 (11/12/2009)  Complete Metabolic Panel   Glucose:  134 (06/20/2009)   Sodium:  143 (11/12/2009)   Potassium:  3.4 (11/12/2009)   Chloride:  107 (11/12/2009)   CO2:  28 (11/12/2009)   BUN:  9 (11/12/2009)   Creatinine:  0.74 (11/12/2009)   Albumin:  4.3 (06/20/2009)   Total Protein:  7.0 (06/20/2009)   Calcium:  9.3 (11/12/2009)   Total Bili:  0.6 (06/20/2009)   Alk Phos:  62 (06/20/2009)   SGPT (ALT):  38 (06/20/2009)   SGOT (AST):  30 (06/20/2009)   -  Date:  11/12/2009    WBC: 8.3    HGB: 12.7    HCT: 37.6    RBC: 4.16    PLT: 239    MCV: 90    RDW:  14.7    Neutrophil: 56.4    Lymphs: 34.5    Monos: 6.0    Eos: 2.7    Basophil: 0.4    BUN: 9    Creatinine: 0.74    Sodium: 143    Potassium: 3.4    Chloride: 107    CO2 Total: 28    Calcium: 9.3    GLU 105  Medications Prior to Update: 1)  Synthroid 75 Mcg Tabs (Levothyroxine Sodium) .Marland Kitchen.. 1 Once Daily 2)  Paxil Cr 25 Mg Xr24h-Tab (Paroxetine Hcl) .Marland Kitchen.. 1 Two Times A Day 3)  Singulair 10 Mg Tabs (Montelukast Sodium) .Marland Kitchen.. 1 Once Daily 4)  Metformin Hcl 500 Mg Tabs (Metformin Hcl) .Marland Kitchen.. 1 Two Times A Day 5)  Hydrochlorothiazide 25 Mg Tabs (Hydrochlorothiazide) .Marland Kitchen.. 1 Once Daily 6)  Xanax 0.25 Mg Tabs (Alprazolam) .Marland Kitchen.. 1 Once Daily As Needed 7)  Ambien 10 Mg Tabs (Zolpidem Tartrate) .Marland Kitchen.. 1 At Bedtime As Needed 8)  Prilosec Otc 20 Mg Tbec (Omeprazole Magnesium) .... Take  One 30-60 Min Before First Meal of The Day 9)  Levaquin 750 Mg  Tabs (Levofloxacin) .... One Tablet By Mouth Daily X 5 Days 10)  Tramadol Hcl 50 Mg  Tabs (Tramadol Hcl) .... One To Two By Mouth Every 4-6 Hours As Needed For Pain  Current Medications (verified): 1)  Synthroid 75 Mcg Tabs (Levothyroxine Sodium) .Marland Kitchen.. 1 Once Daily 2)  Paxil Cr 25 Mg Xr24h-Tab (Paroxetine Hcl) .Marland Kitchen.. 1 Two Times A Day 3)  Singulair 10 Mg Tabs (Montelukast Sodium) .Marland Kitchen.. 1 Once Daily 4)  Metformin Hcl 500 Mg Tabs (Metformin Hcl) .Marland Kitchen.. 1 Two Times A Day 5)  Hydrochlorothiazide 25 Mg Tabs (Hydrochlorothiazide) .Marland Kitchen.. 1 Once Daily 6)  Xanax 0.25 Mg Tabs (Alprazolam) .Marland Kitchen.. 1 Once Daily As Needed 7)  Ambien 10 Mg Tabs (Zolpidem Tartrate) .Marland Kitchen.. 1 At Bedtime As Needed 8)  Nascobal 500 Mcg/0.47m Soln (Cyanocobalamin) .... One Puff in A Nostril Weekly  Allergies (verified): 1)  ! Pcn 2)  ! Sulfa  Past History:  Past Medical History: Last updated: 09/29/2009 Morbid Obesity     - Target wt  =  185   for BMI < 30  Diabetes, Type 2 Hypertension Sleep Apnea Hyperlipidemia Pulmonary Fibrosis after Acute Lung  Injury.................................Marland Kitchenert      - ? CAP  02/2009    - CT chest [place] 03/28/09 c/w PF with baseline cxr nl 03/09/09    - f/u PFT's September 20, 2009  FEV1 1.55 (57%) ratio 83  with DLC0 58% and no desat x 3 laps    - Completed rehab June 2011  Born with [employer], rotated organs abdominal repair until age 44     - sp Small Bowel Obstruction, resolved with NG/ bowel rest around 2009 Anxiety Disorder GERD Irritable Bowel Syndrome Obesity  Past Surgical History: Last updated: 05/23/2009 Cholecystectomy 1992 Tubal ligation 1988 Newborn GI surgery for organs outside the abd Small bowel repir  Family History: Last updated: 06/20/2009 No FH of Colon Cancer: Lymphoma: Mother Family History of Diabetes: Father, Son no goiter or other thyroid probs type 2 dm: father type 1 dm:  son  Social History: Last updated: 06/20/2009 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 "Tried as a teen". Social ETOH Daily Caffeine Use 2 Regular exercise-yes  Risk Factors: Alcohol Use: 0 (11/21/2009) Exercise: yes (06/20/2009)  Risk Factors: Smoking Status: never (11/21/2009)  Family History: Reviewed history from 06/20/2009 and no changes required. No FH of Colon Cancer: Lymphoma: Mother Family History of Diabetes: Father, Son no goiter or other thyroid probs type 2 dm: father type 1 dm:  son  Social History: Reviewed history from 06/20/2009 and no changes required. 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 Social ETOH Daily Caffeine Use 2 Regular exercise-yes  Review of Systems  The patient denies anorexia, fever, weight loss, chest pain, syncope, dyspnea on exertion, peripheral edema, prolonged cough, headaches, hemoptysis, abdominal pain, and hematuria.   General:  Denies chills, fatigue, fever, loss of appetite, malaise, sleep disorder, sweats, weakness, and  weight loss. GU:  Denies abnormal vaginal bleeding, decreased libido, dysuria, hematuria, incontinence, nocturia, urinary frequency, and urinary hesitancy. MS:  Denies joint pain, joint redness, joint swelling, muscle aches, muscle, cramps, muscle weakness, and stiffness. Endo:  Denies cold intolerance, excessive hunger, excessive thirst, excessive urination, heat intolerance, polyuria, and weight change.  Physical Exam  General:  alert, well-developed, well-nourished, well-hydrated, appropriate dress, normal appearance, healthy-appearing, cooperative to examination, good hygiene, and overweight-appearing.   Mouth:  Oral mucosa and oropharynx without lesions or exudates.  Teeth in good repair. Neck:  supple, full ROM, no masses, no thyromegaly, no thyroid nodules or tenderness, no JVD, normal carotid upstroke, no carotid bruits, no cervical lymphadenopathy, and no neck tenderness.   Lungs:  normal respiratory effort, no intercostal retractions, no accessory muscle use, normal breath sounds, no dullness, no fremitus, no crackles, and no wheezes.   Heart:  normal rate, regular rhythm, no murmur, no gallop, no rub, and no JVD.   Abdomen:  soft, non-tender, normal bowel sounds, no distention, no masses, no guarding, no rigidity, no rebound tenderness, no abdominal hernia, no inguinal hernia, no hepatomegaly, no splenomegaly, and abdominal scar(s).  No CVAT. Msk:  normal ROM, no joint tenderness, no joint swelling, no joint warmth, no redness over joints, no joint deformities, no joint instability, and no crepitation.   Pulses:  R and L carotid,radial,femoral,dorsalis pedis and posterior tibial pulses are full and equal bilaterally Extremities:  No clubbing, cyanosis, edema, or deformity noted with normal full range of motion of all joints.   Neurologic:  No cranial nerve deficits noted. Station and gait are normal. Plantar reflexes are down-going bilaterally. DTRs are symmetrical throughout. Sensory,  motor and coordinative functions appear intact. Skin:  Intact without suspicious lesions or rashes Cervical Nodes:  no anterior cervical adenopathy and no posterior cervical adenopathy.   Axillary Nodes:  no R  axillary adenopathy and no L axillary adenopathy.   Inguinal Nodes:  no R inguinal adenopathy.   Psych:  Cognition and judgment appear intact. Alert and cooperative with normal attention span and concentration. No apparent delusions, illusions, hallucinations  Diabetes Management Exam:    Foot Exam (with socks and/or shoes not present):       Sensory-Pinprick/Light touch:          Left medial foot (L-4): normal          Left dorsal foot (L-5): normal          Left lateral foot (S-1): normal          Right medial foot (L-4): normal          Right dorsal foot (L-5): normal          Right lateral foot (S-1): normal       Sensory-Monofilament:          Left foot: normal          Right foot: normal       Inspection:          Left foot: normal          Right foot: normal       Nails:          Left foot: normal          Right foot: normal   Impression & Recommendations:  Problem # 1:  HYPOKALEMIA (ICD-276.8) Assessment New  Orders: Venipuncture (16967) TLB-BMP (Basic Metabolic Panel-BMET) (89381-[id number]) TLB-Magnesium (Mg) (83735-MG) T-Urine Culture (Spectrum Order) [phone]) TLB-TSH (Thyroid Stimulating Hormone) (84443-TSH) TLB-A1C / Hgb A1C (Glycohemoglobin) (83036-A1C)  Problem # 2:  UTI (ICD-599.0) Assessment: Unchanged  The following medications were removed from the medication list:    Levaquin 750 Mg Tabs (Levofloxacin) ..... One tablet by mouth daily x 5 days  Orders: Venipuncture (35361) TLB-BMP (Basic Metabolic Panel-BMET) (44315-[id number]) TLB-Magnesium (Mg) (83735-MG) T-Urine Culture (Spectrum Order) ([phone]) TLB-TSH (Thyroid Stimulating Hormone) (84443-TSH) TLB-A1C / Hgb A1C (Glycohemoglobin) (83036-A1C)  Problem # 3:  B12 DEFICIENCY  (ICD-266.2) Assessment: Unchanged will change to nascobal ns  Problem # 4:  HYPOTHYROIDISM (ICD-244.9) Assessment: Unchanged  Her updated medication list for this problem includes:    Synthroid 75 Mcg Tabs (Levothyroxine sodium) .Marland Kitchen... 1 once daily  Orders: Venipuncture (24580) TLB-BMP (Basic Metabolic Panel-BMET) (99833-[id number]) TLB-Magnesium (Mg) (83735-MG) T-Urine Culture (Spectrum Order) [phone]) TLB-TSH (Thyroid Stimulating Hormone) (84443-TSH) TLB-A1C / Hgb A1C (Glycohemoglobin) (83036-A1C)  Problem # 5:  HYPERTENSION (ICD-401.9) Assessment: Improved  Her updated medication list for this problem includes:    Hydrochlorothiazide 25 Mg Tabs (Hydrochlorothiazide) .Marland Kitchen... 1 once daily  Orders: Venipuncture (02409) TLB-BMP (Basic Metabolic Panel-BMET) (73532-[id number]) TLB-Magnesium (Mg) (83735-MG) T-Urine Culture (Spectrum Order) ([phone]) TLB-TSH (Thyroid Stimulating Hormone) (84443-TSH) TLB-A1C / Hgb A1C (Glycohemoglobin) (83036-A1C)  BP today: 128/86 Prior BP: 130/82 (09/29/2009)  Prior 10 Yr Risk Heart Disease: Not enough information (06/20/2009)  Labs Reviewed: K+: 3.4 (11/12/2009) Creat: : 0.74 (11/12/2009)   Chol: 206 (06/20/2009)   HDL: 39.30 (06/20/2009)   TG: 209.0 (06/20/2009)  Problem # 6:  DIABETES, TYPE 2 (ICD-250.00) Assessment: Unchanged  Her updated medication list for this problem includes:    Metformin Hcl 500 Mg Tabs (Metformin hcl) .Marland Kitchen... 1 two times a day  Orders: Venipuncture (98921) TLB-BMP (Basic Metabolic Panel-BMET) ([id number]) TLB-Magnesium (Mg) (83735-MG) T-Urine Culture (Spectrum Order) ([id number]) TLB-TSH (Thyroid Stimulating Hormone) (84443-TSH) TLB-A1C / Hgb A1C (Glycohemoglobin) (83036-A1C)  Labs Reviewed: Creat: 0.74 (11/12/2009)     Last Eye Exam: normal (  09/23/2008) Reviewed HgBA1c results: 5.8 (06/20/2009)  Complete Medication List: 1)  Synthroid 75 Mcg Tabs (Levothyroxine sodium) .Marland Kitchen.. 1 once daily 2)   Paxil Cr 25 Mg Xr24h-tab (Paroxetine hcl) .Marland Kitchen.. 1 two times a day 3)  Singulair 10 Mg Tabs (Montelukast sodium) .Marland Kitchen.. 1 once daily 4)  Metformin Hcl 500 Mg Tabs (Metformin hcl) .Marland Kitchen.. 1 two times a day 5)  Hydrochlorothiazide 25 Mg Tabs (Hydrochlorothiazide) .Marland Kitchen.. 1 once daily 6)  Xanax 0.25 Mg Tabs (Alprazolam) .Marland Kitchen.. 1 once daily as needed 7)  Ambien 10 Mg Tabs (Zolpidem tartrate) .Marland Kitchen.. 1 at bedtime as needed 8)  Nascobal 500 Mcg/0.61m Soln (Cyanocobalamin) .... One puff in a nostril weekly  Hypertension Assessment/Plan:      The patient's hypertensive risk group is category C: Target organ damage and/or diabetes.  Today's blood pressure is 128/86.  Her blood pressure goal is < 130/80.  Patient Instructions: 1)  Please schedule a follow-up appointment in 1 month. 2)  It is important that you exercise regularly at least 20 minutes 5 times a week. If you develop chest pain, have severe difficulty breathing, or feel very tired , stop exercising immediately and seek medical attention. 3)  You need to lose weight. Consider a lower calorie diet and regular exercise.  4)  Check your blood sugars regularly. If your readings are usually above 200 or below 70 you should contact our office. 5)  It is important that your Diabetic A1c level is checked every 3 months. 6)  See your eye doctor yearly to check for diabetic eye damage. 7)  Check your feet each night for sore areas, calluses or signs of infection. 8)  Check your Blood Pressure regularly. If it is above 130/80: you should make an appointment. Prescriptions: NASCOBAL 500 MCG/0.1ML SOLN (CYANOCOBALAMIN) One puff in a nostril weekly  #2 bot x 0   Entered and Authorized by:   TJanith LimaMD   Signed by:   TJanith LimaMD on 11/21/2009   Method used:   Samples Given   RxID:   [id number]

## 2010-03-16 NOTE — Assessment & Plan Note (Signed)
Summary: PER PT 2 WK B12  TLJ--STC  Nurse Visit   Allergies: 1)  ! Pcn 2)  ! Sulfa  Medication Administration  Injection # 1:    Medication: Vit B12 1000 mcg    Diagnosis: B12 DEFICIENCY (ICD-266.2)    Route: IM    Site: R deltoid    Exp Date: 05/2011    Lot #: [id number]    Mfr: [employer]    Patient tolerated injection without complications    Given by: Shirlean Mylar Ewing (July 26, 2009 3:03 PM)  Orders Added: 1)  Vit B12 1000 mcg [J3420] 2)  Admin of Therapeutic Inj  intramuscular or subcutaneous [50158]

## 2010-03-16 NOTE — Miscellaneous (Signed)
Summary: Orders Update  Clinical Lists Changes  Orders: Added new Test order of T-2 View CXR (71020TC) - Signed 

## 2010-03-16 NOTE — Letter (Signed)
Summary: Diabetic Instructions  [facility]  [place], [place] [address]   Phone: [phone]  Fax: [phone]    Kirsten [name] May 27, 1955 MRN: [id number]   X   ORAL DIABETIC MEDICATION INSTRUCTIONS  The day before your procedure:   Take your diabetic pill as you do normally  The day of your procedure:   Do not take your diabetic pill    We will check your blood sugar levels during the admission process and again in Recovery before discharging you home

## 2010-03-16 NOTE — Progress Notes (Signed)
Summary: return call  Phone Note Call from Patient Call back at Work Phone [phone]   Caller: Patient Call For: wert Reason for Call: Talk to Nurse Summary of Call: pt returning call to [facility]. Initial call taken by: Zigmund Gottron,  September 21, 2009 11:11 AM  Follow-up for Phone Call        pt informed of MW recs and pt is scheduled to come in tomorrow for follow up. Iran Planas CMA  September 21, 2009 11:21 AM

## 2010-03-16 NOTE — Miscellaneous (Signed)
Summary: Orders Update pft charges  Clinical Lists Changes  Orders: Added new Service order of Carbon Monoxide diffusing w/capacity (94720) - Signed Added new Service order of Lung Volumes (94240) - Signed Added new Service order of Spirometry (Pre & Post) (94060) - Signed 

## 2010-03-16 NOTE — Assessment & Plan Note (Signed)
Summary: B-12 INJECTION- 3:45PM-LB  Nurse Visit   Vitals Entered By: Gardenia Phlegm RMA (Jul 12, 2009 3:31 PM)  Allergies: 1)  ! Pcn 2)  ! Sulfa  Medication Administration  Injection # 1:    Medication: Vit B12 1000 mcg    Diagnosis: B12 DEFICIENCY (ICD-266.2)    Route: IM    Site: L deltoid    Exp Date: 11/12    Lot #: [id number]    Mfr: [employer]    Patient tolerated injection without complications    Given by: Gardenia Phlegm RMA (Jul 12, 2009 3:32 PM)  Orders Added: 1)  Vit B12 1000 mcg [J3420] 2)  Admin of Therapeutic Inj  intramuscular or subcutaneous [74128]

## 2010-03-16 NOTE — Miscellaneous (Signed)
Summary: [facility] results  Clinical Lists Changes  Observations: Added new observation of CBC COMMENTS: MCH-29.7, MCHC-34.0 (02/07/2010 16:18) Added new observation of BASOPHIL %: 0.3 % (02/07/2010 16:18) Added new observation of % EOS AUTO: 2.7 % (02/07/2010 16:18) Added new observation of MONOCYTE %: 8.3 % (02/07/2010 16:18) Added new observation of LYMPH %: 38.3 % (02/07/2010 16:18) Added new observation of PMN %: 50.4 % (02/07/2010 16:18) Added new observation of RDW: 14.4 % (02/07/2010 16:18) Added new observation of MCV: 87.3 fL (02/07/2010 16:18) Added new observation of PLATELETK/UL: 274 K/uL (02/07/2010 16:18) Added new observation of RBC M/UL: 4.72 M/uL (02/07/2010 16:18) Added new observation of HCT: 41.2 % (02/07/2010 16:18) Added new observation of HGB: 14.0 g/dL (02/07/2010 16:18) Added new observation of WBC COUNT: 7.0 10*3/microliter (02/07/2010 16:18) Added new observation of UR COMMENTS: colony Count 20,000 colonies/Ml- Multiple bacterial morphotypes present, none predominant. Suggest appropriate recollection if clinically indicated  (02/02/2010 16:29) Added new observation of ALBUMIN: 4.5 g/dL (02/02/2010 16:27) Added new observation of BG RANDOM: 181 mg/dL (02/02/2010 16:24) Added new observation of LIPIDMEDPRES: Direct bilirubin-0.1  (02/02/2010 16:23) Added new observation of BILI TOTAL: 0.6 mg/dL (02/02/2010 16:23) Added new observation of SGOT (AST): 25 units/L (02/02/2010 16:22) Added new observation of SGPT (ALT): 42 units/L (02/02/2010 16:22) Added new observation of ALK PHOS: 65 units/L (02/02/2010 16:22) Added new observation of LIPIDMEDPRES: VLDL-63, cholesterol/HDL ratio-5.6  (02/02/2010 16:20) Added new observation of TSH: 2.453 microintl units/mL (02/02/2010 16:20) Added new observation of TRIGLYCERIDE: 315 mg/dL (02/02/2010 16:20) Added new observation of HDL: 35.2 mg/dL (02/02/2010 16:20) Added new observation of LDL: 98.8 mg/dL (02/02/2010  16:20) Added new observation of CHOLESTEROL: 197 mg/dL (02/02/2010 16:20)      -  Date:  02/07/2010    WBC: 7.0    HGB: 14.0    HCT: 41.2    RBC: 4.72    PLT: 274    MCV: 87.3    RDW: 14.4    Neutrophil: 50.4    Lymphs: 38.3    Monos: 8.3    Eos: 2.7    Basophil: 0.3    CBC Comments: MCH-29.7, MCHC-34.0  Date:  02/02/2010    Cholesterol: 197    LDL: 98.8    HDL: 35.2    Triglycerides: 315    TSH: 2.453    Current Lipid Medications Direct bilirubin-0.1    Current Lipid Medications VLDL-63, cholesterol/HDL ratio-5.6    Alk Phos: 65    SGPT (ALT): 42    SGOT (AST): 25    Bilirubin-total: 0.6    BG Random: 181    Albumin: 4.5    Urinalysis Comments: colony Count 20,000 colonies/Ml- Multiple bacterial morphotypes present, none predominant. Suggest appropriate recollection if clinically indicated

## 2010-03-16 NOTE — Progress Notes (Signed)
Summary: RESULTS - B12  Phone Note Outgoing Call   Summary of Call: [place]:  Her B12 level is low, see if she can come in for a B12 injection  Follow-up for Phone Call       Follow-up by: [name] MD,  Jun 21, 2009 9:21 AM  Additional Follow-up for Phone Call Additional follow up Details #1::        left mess to call office back, does pt need mthly b12? [place] nurse visit only or does she need office visit w/you?  Additional Follow-up by: Charlsie Quest, [place],  Jun 28, 2009 8:31 AM    Additional Follow-up for Phone Call Additional follow up Details #2::    she needs b12 injection every 2 weeks for 3 shots then once  a month injection or weekly nasal spray Follow-up by: [name] MD,  Jun 28, 2009 8:38 AM  Additional Follow-up for Phone Call Additional follow up Details #3:: Details for Additional Follow-up Action Taken: Pt informed, first injection today Additional Follow-up by: Charlsie Quest, CMA,  Jun 28, 2009 3:57 PM

## 2010-03-16 NOTE — Progress Notes (Signed)
  Phone Note Other Incoming   Request: Send information Summary of Call: Request for records received from [facility]. Request forwarded to [facility].

## 2010-03-16 NOTE — Letter (Signed)
Summary: Lipid Letter  [facility]   [place], [place] [address]   Phone: [phone]  Fax: [phone]    06/24/2009  Maryah Marinaro [address] [place], [place]  [address]  Dear Erline Levine:  We have carefully reviewed your last lipid profile from  and the results are noted below with a summary of recommendations for lipid management.    Cholesterol:       206     Goal: <200   HDL "good" Cholesterol:   39.30     Goal: >40   LDL "bad" Cholesterol:   139     Goal: <100   Triglycerides:       209.0     Goal: <150        TLC Diet (Therapeutic Lifestyle Change): Saturated Fats & Transfatty acids should be kept < 7% of total calories ***Reduce Saturated Fats Polyunstaurated Fat can be up to 10% of total calories Monounsaturated Fat Fat can be up to 20% of total calories Total Fat should be no greater than 25-35% of total calories Carbohydrates should be 50-60% of total calories Protein should be approximately 15% of total calories Fiber should be at least 20-30 grams a day ***Increased fiber may help lower LDL Total Cholesterol should be < 248m/day Consider adding plant stanol/sterols to diet (example: Benacol spread) ***A higher intake of unsaturated fat may reduce Triglycerides and Increase HDL    Adjunctive Measures (may lower LIPIDS and reduce risk of Heart Attack) include: Aerobic Exercise (20-30 minutes 3-4 times a week) Limit Alcohol Consumption Weight Reduction Aspirin 75-81 mg a day by mouth (if not allergic or contraindicated) Dietary Fiber 20-30 grams a day by mouth     Current Medications: 1)    Synthroid 75 Mcg Tabs (Levothyroxine sodium) ..Marland Kitchen. 1 once daily 2)    Paxil Cr 25 Mg Xr24h-tab (Paroxetine hcl) ..Marland Kitchen. 1 two times a day 3)    Singulair 10 Mg Tabs (Montelukast sodium) ..Marland Kitchen. 1 once daily 4)    Metformin Hcl 500 Mg Tabs (Metformin hcl) ..Marland Kitchen. 1 two times a day 5)    Hydrochlorothiazide 25 Mg Tabs (Hydrochlorothiazide) ..Marland Kitchen. 1 once daily 6)     Xanax 0.25 Mg Tabs (Alprazolam) ..Marland Kitchen. 1 once daily as needed 7)    Ambien 10 Mg Tabs (Zolpidem tartrate) ..Marland Kitchen. 1 at bedtime as needed 8)    Pepcid 20 Mg Tabs (Famotidine) ..Marland Kitchen. 1 at bedtime 9)    Prilosec Otc 20 Mg Tbec (Omeprazole magnesium) .... Take  one 30-60 min before first meal of the day 10)    Wellbutrin Xl 150 Mg Xr24h-tab (Bupropion hcl) .... One by mouth once daily  If you have any questions, please call. We appreciate being able to work with you.   Sincerely,    [facility] TJanith LimaMD

## 2010-03-16 NOTE — Assessment & Plan Note (Signed)
Summary: Pulmonary new pt eval/ PF after cap   Visit Type:  Initial Consult Copy to:  Self Primary Provider/Referring Provider:  none  CC:  Dyspnea and abnormal cxr.  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  Mar 02 2009 flew to [facility] then the next day scratchy throat and cough ? allergy to dog few back home next day felt tired then after sleeping woke up 1 am severe sob, wheezing > [facility] with rx neb better enough to go home but next day readmitted x 11 days rx with tamiflu  > at discharge still weak, still sob and wheezing heart racing but  fever gone,  some cp right chest improving gradually.  April 08, 2009 cc still intermittent cp beneath right breath but much better than it was, short of breath speaking and quite hoarse some short of breath grocery stop.  Pt denies any significant sore throat, dysphagia, itching, sneezing,  nasal congestion or excess secretions,  fever, chills, sweats, unintended wt loss, pleuritic or exertional cp, hempoptysis, change in activity tolerance  orthopnea pnd or leg swelling Pt also denies any obvious fluctuation in symptoms with weather or environmental change or other alleviating or aggravating factors.       Current Medications (verified): 1)  Synthroid 75 Mcg Tabs (Levothyroxine Sodium) .Marland Kitchen.. 1 Once Daily 2)  Paxil Cr 25 Mg Xr24h-Tab (Paroxetine Hcl) .Marland Kitchen.. 1 Two Times A Day 3)  Singulair 10 Mg Tabs (Montelukast Sodium) .Marland Kitchen.. 1 Once Daily 4)  Metformin Hcl 500 Mg Tabs (Metformin Hcl) .Marland Kitchen.. 1 Two Times A Day 5)  Proair Hfa 108 (90 Base) Mcg/act Aers (Albuterol Sulfate) .... Inhale 2 Puffs Every 4-6 Hours As Needed 6)   Hydrochlorothiazide 25 Mg Tabs (Hydrochlorothiazide) .Marland Kitchen.. 1 Once Daily 7)  Symbicort 160-4.5 Mcg/act Aero (Budesonide-Formoterol Fumarate) .... 2 Puffs Every 12 Hours  Allergies (verified): 1)  ! Pcn 2)  ! Sulfa  Past History:  Past Medical History: Diabetes, Type 2 Hypertension Sleep Apnea Hyperlipidemia Pulmonary Fibrosis after Acute Lung Injury ? CAP  02/2009    - CT chest [place] 03/28/09 c/w PF with baseline cxr nl 03/09/09  Past Surgical History: Cholecystectomy 1992 Tubal ligation 1988  Social History: 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 "Tried as a teen". Social ETOH  Review of Systems       The patient complains of shortness of breath with activity, shortness of breath at rest, non-productive cough, chest pain, irregular heartbeats, indigestion, and anxiety.  The patient denies productive cough, coughing up blood, acid heartburn, loss of appetite, weight change, abdominal pain, difficulty swallowing, sore throat, tooth/dental problems, headaches, nasal congestion/difficulty breathing through nose, sneezing, itching, ear ache, depression, hand/feet swelling, joint stiffness or pain, rash, change in color of mucus, and fever.    Vital Signs:  Patient profile:   55 year old female Height:      67 inches Weight:      207.25 pounds BMI:     32.58 O2 Sat:      93 % on Room air Temp:     97.8 degrees F oral Pulse rate:  115 / minute BP sitting:   130 / 86  (left arm)  Vitals Entered By: Tilden Dome (April 08, 2009 9:42 AM)  O2 Flow:  Room air  Physical Exam  Additional Exam:  obese anxious wf nad wt 207 April 08, 2009 HEENT: nl dentition, turbinates, and orophanx. Nl external ear canals without cough reflex NECK :  without JVD/Nodes/TM/ nl carotid upstrokes bilaterally LUNGS: no acc muscle use, clear to A and P bilaterally without cough on insp or exp maneuvers CV:  RRR  no s3 or murmur or increase in P2, no edema   ABD:  soft and nontender with nl excursion in the supine position. No bruits or organomegaly, bowel sounds nl MS:  warm without deformities, calf tenderness, cyanosis or clubbing SKIN: warm and dry without lesions   NEURO:  alert, approp, no deficits     CT of Chest  Procedure date:  03/28/2009  Findings:      c/w PF patchy pattern, R > L  Impression & Recommendations:  Problem # 1:  PULMONARY INFILTRATE INCLUDES (EOSINOPHILIA) (ICD-518.3) Classic ALI pattern with nl baseline cxr.  Rassured pt and husband that this is part of the natural hx of lung repair with an excellent prognosis based on improvement to date ( < 6 weeks since onset of symptoms and esr nl )  REc conservative rx  Problem # 2:  UNSPECIFIED TACHYCARDIA (ICD-785.0)  probably related to overuse of B2.  Try rx airways with GERD rx  and delsym and just use the xopenex  if needed  I spent extra time with the patient today explaining optimal mdi  technique.  This improved from  25-75%  Orders: New Patient Level V (19622)  Medications Added to Medication List This Visit: 1)  Synthroid 75 Mcg Tabs (Levothyroxine sodium) .Marland Kitchen.. 1 once daily 2)  Paxil Cr 25 Mg Xr24h-tab (Paroxetine hcl) .Marland Kitchen.. 1 two times a day 3)  Singulair 10 Mg Tabs (Montelukast sodium) .Marland Kitchen.. 1 once daily 4)  Metformin Hcl 500 Mg Tabs (Metformin hcl) .Marland Kitchen.. 1 two times a day 5)  Proair Hfa 108 (90 Base) Mcg/act Aers (Albuterol sulfate) .... Inhale 2 puffs every 4-6 hours as needed 6)  Hydrochlorothiazide 25 Mg Tabs (Hydrochlorothiazide) .Marland Kitchen.. 1 once daily 7)  Symbicort 160-4.5 Mcg/act Aero (Budesonide-formoterol fumarate) .... 2 puffs every 12 hours 8)  Xopenex Hfa 45 Mcg/act Aero (Levalbuterol tartrate) .... 2 puffs every 6 hours if needed  Other Orders: HFA Instruction [phone]) EKG w/ Interpretation (93000) TLB-CBC Platelet - w/Differential (85025-CBCD) TLB-Sedimentation Rate (ESR) (85652-ESR)  Patient Instructions: 1)  Acid reflux is a leading  suspect here (for your hoarseness and throat irritation and needs to be eliminated  completely before considering additional studies or treatment options. To suppress this maximally, take  prilosec 30 min  before first  meal and pepcid 20 mg (otc) at bedtime plus diet measures as listed.  2)  GERD (REFLUX)  is a common cause of respiratory symptoms. It commonly presents without heartburn and can be treated with medication, but also with lifestyle changes including avoidance of late meals, excessive alcohol, smoking cessation, and avoid fatty foods, chocolate, peppermint, colas, red wine, and acidic juices such as orange juice. NO MINT OR MENTHOL PRODUCTS SO NO COUGH DROPS  3)  USE SUGARLESS CANDY INSTEAD (jolley ranchers)  4)  NO OIL BASED VITAMINS  5)  Stop symbicort 6)  Only use proaire if needed  for short of breath and if you need more proaire we need to  switch for xopenex 7)  Work on inhaler technique:  relax and blow all the way out then take a nice smooth deep breath back in, triggering the inhaler at same time you start breathing in 8)  Please schedule a follow-up appointment in 2 weeks, sooner if needed with cxr return.  Prescriptions: XOPENEX HFA 45 MCG/ACT AERO (LEVALBUTEROL TARTRATE) 2 puffs every 6 hours if needed  #1 x 0   Entered and Authorized by:   Tanda Rockers MD   Signed by:   Tanda Rockers MD on 04/08/2009   Method used:   Electronically to        [facility]. [address] [phone]* (retail)       [address] [place], [place]  [id number]       Ph: [phone]       Fax: [phone]   RxID:   [id number]    CardioPerfect ECG  ID: [id number] Patient: NEITA, LANDRIGAN DOB: 06/25/1955 Age: 55 Years Old Sex: Female Race: White Height: 67 Weight: 207.25 Status: Unconfirmed Recorded: 04/08/2009 10:35 AM P/PR: 95 ms / 130 ms - Heart rate (maximum exercise) QRS: 108 QT/QTc/QTd: 362 ms / 432 ms / 147 ms - Heart rate (maximum exercise)  P/QRS/T axis: 56 deg  / 5 deg / 117 deg - Heart rate (maximum exercise)  Heartrate: 100 bpm  Interpretation:   sinus arrhythmia  premature ventricular complexes  premature supraventricular complexes  minor high-lateral repolarization disturbance, consider ischemia, LV overload or aspecific change   small negative T in aVL    with flat or low negative T in I   Borderline ECG

## 2010-03-16 NOTE — Procedures (Signed)
Summary: Colonoscopy  Patient: [name] Note: All result statuses are Final unless otherwise noted.  Tests: (1) Colonoscopy (COL)   COL Colonoscopy           [facility].     [place], [place]  [address]           COLONOSCOPY PROCEDURE REPORT           PATIENT:  Kirsten [name], Kirsten [name]  MR#:  [id number]     BIRTHDATE:  Dec 21, 1955, 53 yrs. old  GENDER:  female           ENDOSCOPIST:  Sandy Salaam. Deatra Ina, MD     Referred by:  Christena Deem. Melvyn Novas, M.D.           PROCEDURE DATE:  06/16/2009     PROCEDURE:  Diagnostic Colonoscopy     ASA CLASS:  Class II     INDICATIONS:  1) Routine Risk Screening           MEDICATIONS:   Fentanyl 100 mcg IV, Versed 7 mg IV           DESCRIPTION OF PROCEDURE:   After the risks benefits and     alternatives of the procedure were thoroughly explained, informed     consent was obtained.  Digital rectal exam was performed and     revealed no abnormalities.   The LB [id number] endoscope     was introduced through the anus and advanced to the cecum, which     was identified by both the appendix and ileocecal valve, without     limitations.  The quality of the prep was excellent, using     MoviPrep.  The instrument was then slowly withdrawn as the colon     was fully examined.     <<PROCEDUREIMAGES>>           FINDINGS:  A normal appearing cecum, ileocecal valve, and     appendiceal orifice were identified. The ascending, hepatic     flexure, transverse, splenic flexure, descending, sigmoid colon,     and rectum appeared unremarkable (see image1, image2, image4,     image5, image6, image7, and image8).   Retroflexed views in the     rectum revealed no abnormalities.    The time to cecum =  2.75     minutes. The scope was then withdrawn (time =  6.5  min) from the     patient and the procedure completed.           COMPLICATIONS:  None           ENDOSCOPIC IMPRESSION:     1) Normal colon     RECOMMENDATIONS:     1)  Continue current colorectal screening recommendations for     "routine risk" patients with a repeat colonoscopy in 10 years.           REPEAT EXAM:  In 10 year(s) for Colonoscopy.           ______________________________     Sandy Salaam. Deatra Ina, MD           CC:           n.     eSIGNED:   Sandy Salaam. Loneta Tamplin at 06/16/2009 08:35 AM           Debbe Mounts, [phone]  Note: An exclamation mark (!) indicates a result that was not dispersed into the flowsheet.  Document Creation Date: 06/16/2009 8:36 AM _______________________________________________________________________  (1) Order result status: Final Collection or observation date-time: 06/16/2009 08:29 Requested date-time:  Receipt date-time:  Reported date-time:  Referring Physician:   Ordering Physician: Erskine Emery [id number]) Specimen Source:  Source: Tawanna Cooler Order Number: [id number] Lab site:   Appended Document: Colonoscopy     Procedures Next Due Date:    Colonoscopy: 06/2019

## 2010-03-16 NOTE — Assessment & Plan Note (Signed)
Summary: NEW/ [employer]/ SEEING DR Loanne Drilling AT 10/[employer]  #   Vital Signs:  Patient profile:   55 year old female Menstrual status:  postmenopausal LMP:     08/20/2007 Height:      67 inches Weight:      205.25 pounds BMI:     32.26 O2 Sat:      93 % on Room air Temp:     98.3 degrees F oral Pulse rate:   82 / minute Pulse rhythm:   regular Resp:     16 per minute BP sitting:   128 / 70  (left arm) Cuff size:   large  Vitals Entered By: Estell Harpin CMA (Jun 20, 2009 9:03 AM)  Nutrition Counseling: Patient's BMI is greater than 25 and therefore counseled on weight management options.  O2 Flow:  Room air  Primary Care Provider:  [name] MD   History of Present Illness: New to me to establish new primary care. She complains of disturbed sleep, fatigue, and severe stressors ( 2 ill children living at home with her, recent illness, and meds). She feels like her hair is falling out in clumps.  Depression History:      The patient comes in today for her second follow up visit for depression.  She notes that the symptoms started approximately 07/20/2008.  The patient is having a depressed mood most of the day and has a diminished interest in her usual daily activities.  Positive alarm features for depression include insomnia, psychomotor retardation, fatigue (loss of energy), feelings of worthlessness (guilt), and impaired concentration (indecisiveness).  However, she denies significant weight loss, significant weight gain, hypersomnia, psychomotor agitation, and recurrent thoughts of death or suicide.  The patient denies symptoms of a manic disorder including persistently & abnormally elevated mood, abnormally & persistently irritable mood, less need for sleep, talkative or feels need to keep talking, distractibility, flight of ideas, increase in goal-directed activity, psychomotor agitation, inflated self-esteem or grandiosity, excessive buying sprees, excessive sexual indiscretions, and  excessive foolish business investments.        Psychosocial stress factors include a recent traumatic event and major life changes.  Risk factors for depression include a personal history of depression.  The patient denies that she feels like life is not worth living, denies that she wishes that she were dead, and denies that she has thought about ending her life.         Depression Treatment History:  Prior Medication Used:   Start Date: Assessment of Effect:   Comments:  Paxil (paroxetine)     09/29/2008   some improvement     --  Hypertension History:      She denies headache, chest pain, palpitations, orthopnea, peripheral edema, visual symptoms, neurologic problems, syncope, and side effects from treatment.  She notes no problems with any antihypertensive medication side effects.        Positive major cardiovascular risk factors include diabetes, hyperlipidemia, and hypertension.  Negative major cardiovascular risk factors include female age less than 20 years old, negative family history for ischemic heart disease, and non-tobacco-user status.        Further assessment for target organ damage reveals no history of ASHD, cardiac end-organ damage (CHF/LVH), stroke/TIA, peripheral vascular disease, renal insufficiency, or hypertensive retinopathy.      Preventive Screening-Counseling & Management  Alcohol-Tobacco     Alcohol drinks/day: 0     Smoking Status: never  Caffeine-Diet-Exercise     Does Patient Exercise: yes  Hep-HIV-STD-Contraception     Hepatitis Risk: no risk noted     HIV Risk: no risk noted     STD Risk: no risk noted      Sexual History:  currently monogamous.        Drug Use:  never.        Blood Transfusions:  no.    Current Medications (verified): 1)  Synthroid 75 Mcg Tabs (Levothyroxine Sodium) .Marland Kitchen.. 1 Once Daily 2)  Paxil Cr 25 Mg Xr24h-Tab (Paroxetine Hcl) .Marland Kitchen.. 1 Two Times A Day 3)  Singulair 10 Mg Tabs (Montelukast Sodium) .Marland Kitchen.. 1 Once Daily 4)   Metformin Hcl 500 Mg Tabs (Metformin Hcl) .Marland Kitchen.. 1 Two Times A Day 5)  Hydrochlorothiazide 25 Mg Tabs (Hydrochlorothiazide) .Marland Kitchen.. 1 Once Daily 6)  Xanax 0.25 Mg Tabs (Alprazolam) .Marland Kitchen.. 1 Once Daily As Needed 7)  Ambien 10 Mg Tabs (Zolpidem Tartrate) .Marland Kitchen.. 1 At Bedtime As Needed 8)  Pepcid 20 Mg Tabs (Famotidine) .Marland Kitchen.. 1 At Bedtime 9)  Prilosec Otc 20 Mg Tbec (Omeprazole Magnesium) .... Take  One 30-60 Min Before First Meal of The Day  Allergies (verified): 1)  ! Pcn 2)  ! Sulfa  Past History:  Past Medical History: Reviewed history from 05/23/2009 and no changes required. Diabetes, Type 2 Hypertension Sleep Apnea Hyperlipidemia Pulmonary Fibrosis after Acute Lung Injury................................Marland KitchenWert      - ? CAP  02/2009    - CT chest [place] 03/28/09 c/w PF with baseline cxr nl 03/09/09    - f/u PFT's rec April 22, 2009  Born with [employer], rotated organs abdominal repair until age 89 Anxiety Disorder GERD Irritable Bowel Syndrome Obesity Small Bowel Obstruction  Past Surgical History: Reviewed history from 05/23/2009 and no changes required. Cholecystectomy 1992 Tubal ligation 1988 Newborn GI surgery for organs outside the abd Small bowel repir  Family History: Reviewed history from 05/23/2009 and no changes required. No FH of Colon Cancer: Lymphoma: Mother Family History of Diabetes: Father, Son  Social History: Reviewed history from 05/23/2009 and no changes required. 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 "Tried as a teen". Social ETOH Daily Caffeine Use 2 Regular exercise-yes Smoking Status:  never Hepatitis Risk:  no risk noted HIV Risk:  no risk noted STD Risk:  no risk noted Sexual History:  currently monogamous Drug Use:  never Blood Transfusions:  no Does Patient Exercise:  yes  Review of Systems       The patient complains of depression.  The patient denies anorexia, fever, weight loss,  weight gain, chest pain, syncope, dyspnea on exertion, peripheral edema, prolonged cough, headaches, hemoptysis, abdominal pain, hematuria, suspicious skin lesions, unusual weight change, and enlarged lymph nodes.    Physical Exam  General:  alert, well-developed, well-nourished, well-hydrated, appropriate dress, normal appearance, healthy-appearing, cooperative to examination, good hygiene, and overweight-appearing.   Head:  normocephalic, atraumatic, no abnormalities observed, and no abnormalities palpated.   Eyes:  vision grossly intact, pupils equal, pupils round, and pupils reactive to light.   Mouth:  Oral mucosa and oropharynx without lesions or exudates.  Teeth in good repair. Neck:  supple, full ROM, no masses, no thyromegaly, no thyroid nodules or tenderness, no JVD, normal carotid upstroke, no carotid bruits, no cervical lymphadenopathy, and no neck tenderness.   Lungs:  normal respiratory effort, no intercostal retractions, no accessory muscle use, normal breath sounds, no dullness, no fremitus, no crackles, and no wheezes.   Heart:  normal rate, regular rhythm, no murmur, no  gallop, no rub, and no JVD.   Abdomen:  soft, non-tender, normal bowel sounds, no distention, no masses, no guarding, no rigidity, no rebound tenderness, no abdominal hernia, no inguinal hernia, no hepatomegaly, no splenomegaly, and abdominal scar(s).   Msk:  normal ROM, no joint tenderness, no joint swelling, no joint warmth, no redness over joints, no joint deformities, no joint instability, and no crepitation.   Skin:  turgor normal, color normal, no rashes, no suspicious lesions, no ecchymoses, no petechiae, no purpura, no ulcerations, and no edema.   Cervical Nodes:  no anterior cervical adenopathy and no posterior cervical adenopathy.   Axillary Nodes:  no R axillary adenopathy and no L axillary adenopathy.   Psych:  Oriented X3, memory intact for recent and remote, normally interactive, good eye contact, not  anxious appearing, not agitated, not suicidal, not homicidal, dysphoric affect, subdued, and tearful.    Diabetes Management Exam:    Foot Exam (with socks and/or shoes not present):       Sensory-Pinprick/Light touch:          Left medial foot (L-4): normal          Left dorsal foot (L-5): normal          Left lateral foot (S-1): normal          Right medial foot (L-4): normal          Right dorsal foot (L-5): normal          Right lateral foot (S-1): normal       Sensory-Monofilament:          Left foot: normal          Right foot: normal       Inspection:          Left foot: normal          Right foot: normal       Nails:          Left foot: normal          Right foot: normal    Eye Exam:       Eye Exam done elsewhere          Date: 09/23/2008          Results: normal          Done by: ????   Impression & Recommendations:  Problem # 1:  DEPRESSIVE DISORDER (ICD-311) Assessment New  I think she needs to start psychotherapy and to start a med that supports DA and NE. She was referred to [facility] or [facility] for therapy. Her updated medication list for this problem includes:    Paxil Cr 25 Mg Xr24h-tab (Paroxetine hcl) .Marland Kitchen... 1 two times a day    Xanax 0.25 Mg Tabs (Alprazolam) .Marland Kitchen... 1 once daily as needed    Wellbutrin Xl 150 Mg Xr24h-tab (Bupropion hcl) ..... One by mouth once daily  Discussed treatment options, including trial of antidpressant medication. Will refer to behavioral health. Follow-up call in in 24-48 hours and recheck in 2 weeks, sooner as needed. Patient agrees to call if any worsening of symptoms or thoughts of doing harm arise. Verified that the patient has no suicidal ideation at this time.   Problem # 2:  HYPERTENSION (ICD-401.9) Assessment: Improved  Her updated medication list for this problem includes:    Hydrochlorothiazide 25 Mg Tabs (Hydrochlorothiazide) .Marland Kitchen... 1 once daily  Orders: Venipuncture (92330) TLB-B12 + Folate Pnl  (07622_63335-K56/YBW) TLB-IBC Pnl (Iron/FE;Transferrin) (83550-IBC) TLB-Lipid Panel (80061-LIPID)  TLB-BMP (Basic Metabolic Panel-BMET) (69485-IOEVOJJ) TLB-CBC Platelet - w/Differential (85025-CBCD) TLB-Hepatic/Liver Function Pnl (80076-HEPATIC) TLB-TSH (Thyroid Stimulating Hormone) (84443-TSH) TLB-A1C / Hgb A1C (Glycohemoglobin) (83036-A1C) TLB-Udip w/ Micro (81001-URINE)  BP today: 128/70 Prior BP: 110/76 (05/23/2009)  10 Yr Risk Heart Disease: Not enough information  Problem # 3:  DIABETES, TYPE 2 (ICD-250.00) Assessment: Unchanged  Her updated medication list for this problem includes:    Metformin Hcl 500 Mg Tabs (Metformin hcl) .Marland Kitchen... 1 two times a day  Orders: Venipuncture (00938) TLB-B12 + Folate Pnl (18299_37169-C78/LFY) TLB-IBC Pnl (Iron/FE;Transferrin) (83550-IBC) TLB-Lipid Panel (80061-LIPID) TLB-BMP (Basic Metabolic Panel-BMET) (10175-ZWCHENI) TLB-CBC Platelet - w/Differential (85025-CBCD) TLB-Hepatic/Liver Function Pnl (80076-HEPATIC) TLB-TSH (Thyroid Stimulating Hormone) (84443-TSH) TLB-A1C / Hgb A1C (Glycohemoglobin) (83036-A1C) TLB-Udip w/ Micro (81001-URINE)  Last Eye Exam: normal (09/23/2008)  Complete Medication List: 1)  Synthroid 75 Mcg Tabs (Levothyroxine sodium) .Marland Kitchen.. 1 once daily 2)  Paxil Cr 25 Mg Xr24h-tab (Paroxetine hcl) .Marland Kitchen.. 1 two times a day 3)  Singulair 10 Mg Tabs (Montelukast sodium) .Marland Kitchen.. 1 once daily 4)  Metformin Hcl 500 Mg Tabs (Metformin hcl) .Marland Kitchen.. 1 two times a day 5)  Hydrochlorothiazide 25 Mg Tabs (Hydrochlorothiazide) .Marland Kitchen.. 1 once daily 6)  Xanax 0.25 Mg Tabs (Alprazolam) .Marland Kitchen.. 1 once daily as needed 7)  Ambien 10 Mg Tabs (Zolpidem tartrate) .Marland Kitchen.. 1 at bedtime as needed 8)  Pepcid 20 Mg Tabs (Famotidine) .Marland Kitchen.. 1 at bedtime 9)  Prilosec Otc 20 Mg Tbec (Omeprazole magnesium) .... Take  one 30-60 min before first meal of the day 10)  Wellbutrin Xl 150 Mg Xr24h-tab (Bupropion hcl) .... One by mouth once daily  Hypertension  Assessment/Plan:      The patient's hypertensive risk group is category C: Target organ damage and/or diabetes.  Today's blood pressure is 128/70.  Her blood pressure goal is < 130/80.  Patient Instructions: 1)  Please schedule a follow-up appointment in 2 months. 2)  Avoid foods high in acid (tomatoes, citrus juices, spicy foods). Avoid eating within two hours of lying down or before exercising. Do not over eat; try smaller more frequent meals. Elevate head of bed twelve inches when sleeping. 3)  It is important that you exercise regularly at least 20 minutes 5 times a week. If you develop chest pain, have severe difficulty breathing, or feel very tired , stop exercising immediately and seek medical attention. 4)  You need to lose weight. Consider a lower calorie diet and regular exercise.  5)  Check your blood sugars regularly. If your readings are usually above 200  or below 70 you should contact our office. 6)  It is important that your Diabetic A1c level is checked every 3 months. 7)  See your eye doctor yearly to check for diabetic eye damage. 8)  Check your feet each night for sore areas, calluses or signs of infection. 9)  Check your Blood Pressure regularly. If it is above 130/80: you should make an appointment. Prescriptions: AMBIEN 10 MG TABS (ZOLPIDEM TARTRATE) 1 at bedtime as needed  #30 x 5   Entered and Authorized by:   [name] MD   Signed by:   [name] MD on 06/20/2009   Method used:   Print then Give to Patient   RxID:   [id number] Duanne Moron 0.25 MG TABS (ALPRAZOLAM) 1 once daily as needed  #30 x 5   Entered and Authorized by:   [name] MD   Signed by:   [name] MD on 06/20/2009   Method  used:   Print then Give to Patient   RxID:   [id number] PRILOSEC OTC 20 MG TBEC (OMEPRAZOLE MAGNESIUM) Take  one 30-60 min before first meal of the day  #30 x 11   Entered and Authorized by:   [name] MD   Signed by:   [name] MD on  06/20/2009   Method used:   Electronically to        [facility]. [address] [phone]* (retail)       [address] [place], [place]  [address]       Ph: [phone]       Fax: [phone]   RxID:   [id number] PEPCID 20 MG TABS (FAMOTIDINE) 1 at bedtime  #30 x 11   Entered and Authorized by:   [name] MD   Signed by:   [name] MD on 06/20/2009   Method used:   Electronically to        [facility]. [address] [phone]* (retail)       [place], [place]  [id number]       Ph: [phone]       Fax: [phone]   RxID:   [id number] HYDROCHLOROTHIAZIDE 25 MG TABS (HYDROCHLOROTHIAZIDE) 1 once daily  #30 x 11   Entered and Authorized by:   [name] MD   Signed by:   [name] MD on 06/20/2009   Method used:   Electronically to        [facility]. [address] [phone]* (retail)       [place], [place]  [address]       Ph: [phone]       Fax: [phone]   RxID:   [id number] METFORMIN HCL 500 MG TABS (METFORMIN HCL) 1 two times a day  #30 x 11   Entered and Authorized by:   [name] MD   Signed by:   [name] MD on 06/20/2009   Method used:   Electronically to        [facility]. [address] [phone]* (retail)       [address] [place], [place]  [address]       Ph: [phone]       Fax: [phone]   RxID:   [id number] SINGULAIR 10 MG TABS (MONTELUKAST SODIUM) 1 once daily  #30 x 11   Entered and Authorized by:   [name] MD   Signed by:   [name] MD on 06/20/2009   Method used:   Electronically to        [facility]. [address] [phone]* (retail)       [place], [place]  [address]       Ph: [phone]       Fax: [phone]   RxID:   [id number] PAXIL CR 25 MG XR24H-TAB (PAROXETINE HCL) 1 two times a day  #30 x 11   Entered and Authorized by:   [name] MD   Signed by:   [name] MD on 06/20/2009    Method used:   Electronically to        [facility]. [facility] [phone]* (retail)       [phone]  [place], [place]  [address]       Ph: [phone]       Fax: [phone]   RxID:   [id number] SYNTHROID 75 MCG TABS (LEVOTHYROXINE SODIUM) 1 once daily  #30 x 11   Entered and Authorized by:   [name] MD   Signed by:   [name] MD on 06/20/2009   Method used:   Electronically to        [facility]. [address] [phone]* (retail)       [address] [place], [place]  [address]       Ph: [phone]       Fax: [phone]   RxID:   [id number] WELLBUTRIN XL 150 MG XR24H-TAB (BUPROPION HCL) One by mouth once daily  #30 x 11   Entered and Authorized by:   [name] MD   Signed by:   [name] MD on 06/20/2009   Method used:   Electronically to        [facility]. [address] [phone]* (retail)       [address] [place], [place]  [address]       Ph: [phone]       Fax: [phone]   RxID:   [id number]   Preventive Care Screening  Mammogram:    Date:  01/12/2009    Results:  normal   Pap Smear:    Date:  01/12/2009    Results:  normal     Not Administered:    Influenza Vaccine not given due to: declined

## 2010-03-16 NOTE — Letter (Signed)
Summary: Results Follow-up Letter  [facility] [place]   [place], [place] [address]   Phone: [phone]  Fax: [phone]    12/22/2009  [place], [place]  [address]  Dear Ms. Sherbert,   The following are the results of your recent test(s):  Test     Result     Urine       trace of infection Blood sugars   good control Kidney     normal CBC       normal Thyroid     normal   _________________________________________________________  Please call for an appointment soon _________________________________________________________ _________________________________________________________ _________________________________________________________  Sincerely,  [name] MD [facility]

## 2010-03-16 NOTE — Letter (Signed)
Summary: Out of Work  [facility] [place], [place] [address]   Phone: [phone]  Fax: [phone]       November 21, 2009   Employee:  FAITHANN NATAL    To Whom It May Concern:   For Medical reasons, please excuse the above named employee from work for the following dates:  Start:   11/11/09  End:   11/28/09  If you need additional information, please feel free to contact our office.         Sincerely,    [name] MD

## 2010-03-16 NOTE — Progress Notes (Signed)
Summary: Metformin  Phone Note From Pharmacy   Caller: [place] [phone] Summary of Call: Per pharmacist, received escript for Metformin Take 1 tablet by mouth two times a day  #30. Please advise it rx should be #30 or #60 Thanks Initial call taken by: Estell Harpin CMA,  Jun 20, 2009 10:02 AM  Follow-up for Phone Call        pls change to 60 @ Follow-up by: [name] MD,  Jun 20, 2009 10:37 AM  Additional Follow-up for Phone Call Additional follow up Details #1::        Wells Guiles notified #60 metformin and  paxil #30 once daily .Marland KitchenDoe [place]  Jun 20, 2009 10:56 AM

## 2010-03-16 NOTE — Progress Notes (Signed)
Summary: UTI  Phone Note Call from Patient Call back at [phone]   Summary of Call: Patient left message on triage that she still has UTI symptoms and was wondering if her culture showed to be sensitive to what was prescribed and did the MD have any other recommendations. Please advise. Initial call taken by: Ernestene Mention,  August 03, 2009 3:46 PM  Follow-up for Phone Call        culture was not done, we can change to cipro Follow-up by: [name] MD,  August 03, 2009 3:58 PM  Additional Follow-up for Phone Call Additional follow up Details #1::        Pharm called about Cipro solution not being in stock and I made them aware it is ok to give the Cipro tabs. Additional Follow-up by: Ernestene Mention,  August 03, 2009 4:07 PM    Additional Follow-up for Phone Call Additional follow up Details #2::    thanks Follow-up by: [name] MD,  August 03, 2009 4:10 PM  New/Updated Medications: CIPRO 500 MG/5ML (10%) SUSR (CIPROFLOXACIN) One by mouth two times a day for 5 days Prescriptions: CIPRO 500 MG/5ML (10%) SUSR (CIPROFLOXACIN) One by mouth two times a day for 5 days  #10 x 1   Entered and Authorized by:   [name] MD   Signed by:   [name] MD on 08/03/2009   Method used:   Electronically to        [facility]. [address] [phone]* (retail)       [address] [place], [place]  [id number]       Ph: [phone]       Fax: [phone]   [place]:   [phone]

## 2010-03-16 NOTE — Progress Notes (Signed)
  Phone Note Other Incoming   Request: Send information Summary of Call: Request for records received from [facility]. Request forwarded to [employer].      Appended Document:  Request for records received from [facility]. Request forwarded to [employer].

## 2010-03-16 NOTE — Letter (Signed)
Summary: Results Follow-up Letter  [facility] [place]   [place], [place] [address]   Phone: [phone]  Fax: [phone]    11/23/2009  [place], [place]  [address]  Dear Ms. Selden,   The following are the results of your recent test(s):  Test     Result     Urine culture   negative Kidney     normal Blood sugars   slightly elevated Thyroid     normal   _________________________________________________________  Please call for an appointment as directed _________________________________________________________ _________________________________________________________ _________________________________________________________  Sincerely,  [name] MD [facility]

## 2010-03-16 NOTE — Progress Notes (Signed)
Summary: UTI?  Phone Note Call from Patient Call back at [phone]   Summary of Call: Pt completed antibiotic and continues to c/o symptoms. Do you want repeat u/a & culture? Please advise.  Initial call taken by: Charlsie Quest, [place],  August 08, 2009 9:50 AM  Follow-up for Phone Call        she needs to come in for testing for vaginitis Follow-up by: [name] MD,  August 08, 2009 9:53 AM  Additional Follow-up for Phone Call Additional follow up Details #1::        left vm for pt ...............Marland KitchenCharlsie Quest, CMA  August 08, 2009 4:29 PM     Additional Follow-up for Phone Call Additional follow up Details #2::    Left detailed vm on cell # Follow-up by: Charlsie Quest, CMA,  August 10, 2009 1:17 PM

## 2010-03-16 NOTE — Assessment & Plan Note (Signed)
Summary: B12 INJ PER SARAH/LD  Nurse Visit   Vitals Entered By: Charlsie Quest, [place] (Jun 28, 2009 4:01 PM) CC: B12 injection   Allergies: 1)  ! Pcn 2)  ! Sulfa  Medication Administration  Injection # 1:    Medication: Vit B12 1000 mcg    Diagnosis: B12 DEFICIENCY (ICD-266.2)    Route: IM    Site: R deltoid    Exp Date: 01/13/2011    Lot #: [id number]    Mfr: [place]    Patient tolerated injection without complications    Given by: Charlsie Quest, CMA (Jun 29, 2009 5:57 PM)  Orders Added: 1)  Admin of Therapeutic Inj  intramuscular or subcutaneous [[address]] 2)  Vit B12 1000 mcg [J3420]   Medication Administration  Injection # 1:    Medication: Vit B12 1000 mcg    Diagnosis: B12 DEFICIENCY (ICD-266.2)    Route: IM    Site: R deltoid    Exp Date: 01/13/2011    Lot #: [id number]    Mfr: [employer]    Patient tolerated injection without complications    Given by: Charlsie Quest, CMA (Jun 29, 2009 5:57 PM)  Orders Added: 1)  Admin of Therapeutic Inj  intramuscular or subcutaneous [96372] 2)  Vit B12 1000 mcg [J3420]

## 2010-03-16 NOTE — Assessment & Plan Note (Signed)
Summary: Pulmonary/  acute ov for cough exac ? uri/sinus   Copy to:  Self Primary Provider/Referring Provider:  none  CC:  Acute visit.  Pt c/o prod cough with green sputum since 4/1.  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  Mar 02 2009 flew to [facility] then the next day scratchy throat and cough ? allergy to dog few back home next day felt tired then after sleeping woke up 1 am severe sob, wheezing > [place] ER with rx neb better enough to go home but next day readmitted x 11 days rx with tamiflu  > at discharge still weak, still sob and wheezing heart racing but  fever gone,  some cp right chest improving gradually.  April 08, 2009 cc still intermittent cp beneath right breath but much better than it was, short of breath speaking and quite hoarse some short of breath grocery stop. Imp was ? vcd/gerd rec Acid reflux is a leading suspect here (for your hoarseness and throat irritation and needs to be eliminated  completely before considering additional studies or treatment options. To suppress this maximally, take  prilosec 30 min  before first  meal and pepcid 20 mg (otc) at bedtime plus diet measures as listed.  Stop symbicort    April 22, 2009 ov  breathing is some improved. not needing any inhaler .  She states that she still has occ SOB and chest tightness that comes "out of the blue" resolves without treatment in 10 minutes assoc with migratory upper abd fullness, never occurs supine.  no doe.  rec no change rx  May 17, 2009 Acute visit.  Pt c/o prod cough with green sputum since 4/1.  She also c/o  wheeznig at night when lies down. has not used saba however.  Pt denies any significant sore throat, dysphagia, itching, sneezing,  fever, chills, sweats, unintended wt loss, pleuritic or exertional cp, hempoptysis, change in activity tolerance  orthopnea pnd or leg swelling.      Current Medications (verified): 1)  Synthroid 75 Mcg Tabs (Levothyroxine Sodium) .Marland Kitchen.. 1 Once Daily 2)  Paxil Cr 25 Mg Xr24h-Tab (Paroxetine Hcl) .Marland Kitchen.. 1 Two Times A Day 3)  Singulair 10 Mg Tabs (Montelukast Sodium) .Marland Kitchen.. 1 Once Daily 4)  Metformin Hcl 500 Mg Tabs (Metformin Hcl) .Marland Kitchen.. 1 Two Times A Day 5)  Hydrochlorothiazide 25 Mg Tabs (Hydrochlorothiazide) .Marland Kitchen.. 1 Once Daily 6)  Xopenex Hfa 45 Mcg/act Aero (Levalbuterol Tartrate) .... 2 Puffs Every 6 Hours If Needed 7)  Xanax 0.25 Mg Tabs (Alprazolam) .Marland Kitchen.. 1 Once Daily As Needed 8)  Ambien 10 Mg Tabs (Zolpidem Tartrate) .Marland Kitchen.. 1 At Bedtime As Needed 9)  Pepcid 20 Mg Tabs (Famotidine) .Marland Kitchen.. 1 At Bedtime 10)  Prilosec Otc 20 Mg Tbec (Omeprazole Magnesium) .... Take  One 30-60 Min Before First Meal of The Day  Allergies (verified): 1)  ! Pcn 2)  ! Sulfa  Past History:  Past Medical History: Diabetes, Type 2 Hypertension Sleep Apnea Hyperlipidemia Pulmonary Fibrosis after Acute Lung Injury................................Marland KitchenWert      - ? CAP  02/2009    - CT chest [place] 03/28/09 c/w PF with baseline cxr nl 03/09/09    - f/u PFT's rec April 22, 2009   Vital Signs:  Patient profile:   55 year old female Weight:      211 pounds O2 Sat:      95 % on Room air Temp:     98.1 degrees F oral Pulse rate:   72 / minute BP sitting:   122 / 80  (left arm) Cuff size:   large  Vitals Entered By: Tilden Dome (May 17, 2009 9:25 AM)  O2 Flow:  Room air  Physical Exam  Additional Exam:  obese pleasant amb minimally anxious wf nad wt 207 April 08, 2009  >210 April 22, 2009 > 211 May 17, 2009  HEENT: nl dentition, turbinates, and orophanx. Nl external ear canals  without cough reflex NECK :  without JVD/Nodes/TM/ nl carotid upstrokes bilaterally LUNGS: no acc muscle use, clear to A and P bilaterally without cough on insp or exp maneuvers CV:  RRR  no s3 or murmur or increase in P2, no edema  ABD:  soft and nontender with nl excursion in the supine position. No bruits or organomegaly, bowel sounds nl MS:  warm without deformities, calf tenderness, cyanosis or clubbing      Impression & Recommendations:  Problem # 1:  COUGH (ICD-786.2)  Explained natural h/o uri and why it's necessary in patients at risk to rx short term with PPI to reduce risk of evolving cyclical cough triggered by epithelial injury and a heightened sensitivty to the effects of any upper airway irritants,  most importantly acid - related   For now rx as rhinosinusitis.  See instructions for specific recommendations   Orders: Est. Patient Level III (62694)  Problem # 2:  PULMONARY INFILTRATE INCLUDES (EOSINOPHILIA) (ICD-518.3)  w/u in progress  Orders: Est. Patient Level III (85462)  Medications Added to Medication List This Visit: 1)  Doxycycline Hyclate 100 Mg Caps (Doxycycline hyclate) .... One twice daily before eating with glass of water 2)  Prednisone 10 Mg Tabs (Prednisone) .... 4 each am x 2days, 2x2days, 1x2days and stop  Patient Instructions: 1)  Acid reflux is the leading suspect here and needs to be eliminated  completely before considering additional studies or treatment options. To suppress this maximally, take Prilosec  before first and last meal and pepcid 20 mg (otc) at bedtime plus diet measures as listed as long as coughing then once better just use prilosec before bfast and pepcid at bedtime as before 2)  Doxy x 10 days if sputum discolred call, if not better call for ct of sinus [phone] and as for libby 3)  Prednisone x 6 days only 4)  for cough and congestion use mucinex dm 5)  keep previous appt if better Prescriptions: PREDNISONE 10 MG  TABS  (PREDNISONE) 4 each am x 2days, 2x2days, 1x2days and stop  #14 x 0   Entered and Authorized by:   Tanda Rockers MD   Signed by:   Tanda Rockers MD on 05/17/2009   Method used:   Electronically to        [facility]. [address] [phone]* (retail)       [address] [place], [place]  [id number]       Ph: [phone]       Fax: [phone]   RxID:   [id number] DOXYCYCLINE HYCLATE 100  MG CAPS (DOXYCYCLINE HYCLATE) one twice daily before eating with glass of water  #20 x 0   Entered and Authorized by:   Tanda Rockers MD   Signed by:   Tanda Rockers MD on 05/17/2009   Method used:   Electronically to        [facility]. [address] [phone]* (retail)       [address] [place], [place]  [id number]       Ph: [phone]       Fax: [phone]   [place]:   [phone]

## 2010-03-16 NOTE — Progress Notes (Signed)
Summary: refill request  Phone Note Refill Request Message from:  Fax from Pharmacy on January 09, 2010 2:49 PM  Refills Requested: Medication #1:  NASCOBAL 500 MCG/0.1ML SOLN One puff in a nostril weekly.   Dosage confirmed as above?Dosage Confirmed   Supply Requested: 1 month   Last Refilled: 06/20/2009  Medication #2:  AMBIEN 10 MG TABS 1 at bedtime as needed   Dosage confirmed as above?Dosage Confirmed   Supply Requested: 1 month   Last Refilled: 06/20/2009  Is this ok to refill  Initial call taken by: Estell Harpin CMA,  January 09, 2010 2:49 PM  Follow-up for Phone Call        yes Follow-up by: [name] MD,  January 09, 2010 2:57 PM    Prescriptions: Duanne Moron 0.25 MG TABS (ALPRAZOLAM) 1 once daily as needed  #30 x 5   Entered by:   Estell Harpin CMA   Authorized by:   [name] MD   Signed by:   Estell Harpin CMA on 01/09/2010   Method used:   Telephoned to ...       [facility] [address] [phone]* (retail)       [place], [place]  [id number]       Ph: [phone]       Fax: [phone]   RxID:   [id number] AMBIEN 10 MG TABS (ZOLPIDEM TARTRATE) 1 at bedtime as needed  #30 x 5   Entered by:   Estell Harpin CMA   Authorized by:   [name] MD   Signed by:   Estell Harpin CMA on 01/09/2010   Method used:   Telephoned to ...       [facility] [phone]* (retail)       [address] [place], [place]  [id number]       Ph: [phone]       Fax: [phone]   [place]:   [phone]

## 2010-03-16 NOTE — Letter (Signed)
Summary: Results Letter  [facility]  [place], [place] [address]   Phone: [phone]  Fax: [phone]        May 23, 2009 MRN: [id number]    [facility] [address] [place], [place]  [address]    Dear Ms. Hauge,  It is my pleasure to have treated you recently as a new patient in my office. I appreciate your confidence and the opportunity to participate in your care.  Since I do have a busy inpatient endoscopy schedule and office schedule, my office hours vary weekly. I am, however, available for emergency calls everyday through my office. If I am not available for an urgent office appointment, another one of our gastroenterologist will be able to assist you.  My well-trained staff are prepared to help you at all times. For emergencies after office hours, a physician from our Gastroenterology section is always available through my 24 hour answering service  Once again I welcome you as a new patient and I look forward to a happy and healthy relationship             Sincerely,  Inda Castle MD  This letter has been electronically signed by your physician.  Appended Document: Results Letter mailed

## 2010-03-16 NOTE — Assessment & Plan Note (Signed)
Summary: Pulmonary/ acute ext ov for recurrent cp RUQ   Visit Type:  Acute visit Copy to:  Self Primary Provider/Referring Provider:  [name] MD  CC:  The patient c/o increased sob with exertion and at rest...RUQ and back pain...fatigue...wheezing...hoarseness and chills.  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  Mar 02 2009 flew to [facility] then the next day scratchy throat and cough ? allergy to dog few back home next day felt tired then after sleeping woke up 1 am severe sob, wheezing > [place] ER with rx neb better enough to go home but next day readmitted x 11 days rx with tamiflu  > at discharge still weak, still sob and wheezing heart racing but  fever gone,  some cp right chest improving gradually.  April 08, 2009 cc still intermittent cp beneath right breast but much better than it was, short of breath speaking and quite hoarse some short of breath grocery stop. Imp was ? vcd/gerd rec Acid reflux is a leading suspect here (for your hoarseness and throat irritation and needs to be eliminated  completely before considering additional studies or treatment options. To suppress this maximally, take  prilosec 30 min  before first  meal and pepcid 20 mg (otc) at bedtime plus diet measures as listed.  Stop symbicort    April 22, 2009 ov  breathing is some improved. not needing any inhaler .  She states that she still has occ SOB and chest tightness that comes "out of the blue" resolves without treatment in 10 minutes assoc with migratory upper abd fullness, never occurs supine.  no doe.  rec citrucel one tsp two times a  day   May 17, 2009 Acute visit.  Pt c/o prod cough with green sputum since 4/1.  She also c/o wheeznig at night when lies down. has not used saba however.  rec doxy/ prednisone and sinus ct if not better  see page 2 September 22, 2009 Followup on cxr and pft's.  Pt states that overall her breathing is the same- gets SOB some with talking and notices some wheezing when she lies down.  No new complaints today. uses cpap at night and this eliminates the wheezes.  better after rehab.  rec wt loss.  September 29, 2009 ov cc increased sob with exertion and at rest...RUQ and back pain...fatigue...wheezing...hoarseness and non-shaking chills.  All symptoms started 2 days ago, better lying flat, cp same as prev attributed to gerd vs IBS/ adhesions, not using citrucel as prev rec.     Current Medications (verified): 1)  Synthroid 75 Mcg Tabs (Levothyroxine Sodium) .Marland Kitchen.. 1 Once Daily 2)  Paxil Cr 25 Mg Xr24h-Tab (Paroxetine Hcl) .Marland Kitchen.. 1 Two Times A Day 3)  Singulair 10 Mg Tabs (Montelukast Sodium) .Marland Kitchen.. 1 Once Daily 4)  Metformin Hcl 500 Mg Tabs (Metformin Hcl) .Marland Kitchen.. 1 Two Times A Day 5)  Hydrochlorothiazide 25 Mg Tabs (Hydrochlorothiazide) .Marland Kitchen.. 1 Once Daily 6)  Xanax 0.25 Mg Tabs (Alprazolam) .Marland Kitchen.. 1 Once Daily As Needed 7)  Ambien 10 Mg Tabs (Zolpidem Tartrate) .Marland Kitchen.. 1 At Bedtime  As Needed 8)  Prilosec Otc 20 Mg Tbec (Omeprazole Magnesium) .... Take  One 30-60 Min Before First Meal of The Day  Allergies (verified): 1)  ! Pcn 2)  ! Sulfa  Past History:  Past Medical History: Morbid Obesity     - Target wt  =  185   for BMI < 30  Diabetes, Type 2 Hypertension Sleep Apnea Hyperlipidemia Pulmonary Fibrosis after Acute Lung Injury................................Marland KitchenWert      - ? CAP  02/2009    - CT chest [place] 03/28/09 c/w PF with baseline cxr nl 03/09/09    - f/u PFT's September 20, 2009  FEV1 1.55 (57%) ratio 83  with DLC0 58% and no desat x 3 laps    - Completed rehab June 2011  Born with [employer], rotated organs abdominal repair until age 40     - sp Small Bowel Obstruction, resolved with NG/ bowel rest around 2009 Anxiety Disorder GERD Irritable Bowel Syndrome Obesity  Vital Signs:  Patient profile:   55 year old female Menstrual status:  postmenopausal Height:      67 inches (170.18 cm) Weight:      209 pounds (95.00 kg) BMI:     32.85 O2 Sat:      96 % on Room air Temp:     98.5 degrees F (36.94 degrees C) oral Pulse rate:   83 / minute BP sitting:   130 / 82  (left arm) Cuff size:   large  Vitals Entered By: Francesca Jewett CMA (September 29, 2009 3:22 PM)  O2 Sat at Rest %:  96 O2 Flow:  Room air CC: The patient c/o increased sob with exertion and at rest...RUQ and back pain...fatigue...wheezing...hoarseness and chills.  All symptoms started 2 days ago. Comments Medications reviewed. Daytime phone verified. Francesca Jewett [facility]  September 29, 2009 3:23 PM   Physical Exam  Additional Exam:  obese sopmber amb minimally anxious wf nad wt 207 April 08, 2009  >210 April 22, 2009 > 211 May 17, 2009 > 206 September 22, 2009 . 209 September 29, 2009  HEENT: nl dentition, turbinates, and orophanx. Nl external ear canals without cough reflex NECK :  without JVD/Nodes/TM/ nl carotid upstrokes bilaterally LUNGS: no acc muscle use, clear to A and P bilaterally without cough on insp or exp maneuvers CV:  RRR  no s3 or murmur or increase in P2, no edema  ABD:  soft mod distended multiple scars with limited  excursion in the supine position. No bruits or organomegaly, bowel sounds nl MS:  warm without deformities, calf tenderness, cyanosis or clubbing      CXR  Procedure date:  09/29/2009  Findings:      Comparison: 09/20/2009, 04/22/2009.   Findings: Unusual cardiac contour re-identified.  Prominence of the right heart border.  Suggestion of mild cardiomegaly on the lateral view. Other mediastinal contours are within normal limits. Visualized tracheal air column is  within normal limits.  No pneumothorax, pulmonary edema, pleural effusion or consolidation. Eventration of the diaphragm.  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   IMPRESSION: 1.  Stable. No acute cardiopulmonary abnormality. 2.  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   Main risk is prev sbo suggesting adesions never underwent lap so rx with citrucel and f/u GI prn   Orders: Est. Patient Level IV (16384)  Problem # 2:  COUGH (ICD-786.2) minimal exac, doubt bronchits and no evidence of pna but reports change in sputum with low grade chills/fever so rx with short course pred and levaquin   Each maintenance medication was reviewed in detail including most importantly the difference between maintenance and as needed and under what circumstances the prns are to be used.   Medications Added to Medication List This Visit: 1)  Levaquin 750 Mg Tabs (Levofloxacin) .... One tablet by mouth daily x 5 days 2)  Tramadol Hcl 50 Mg Tabs (Tramadol hcl) .... One to two by mouth every 4-6 hours as needed for pain  Other Orders: T-2 View CXR (71020TC)  Patient Instructions: 1)  Levaquin 750 one daily x 5 days 2)  Tramadol 50 mg 1-2 every 4 hours as needed 3)  Citrucel 1 tsp twice daily in large glasss pain  4)  If condition worsens on this regimen go to ER  Prescriptions: TRAMADOL HCL 50 MG  TABS (TRAMADOL HCL) One to two by mouth every 4-6  hours as needed for pain  #40 x 0   Entered and Authorized by:   Tanda Rockers MD   Signed by:   Tanda Rockers MD on 09/29/2009   Method used:   Electronically to        [facility]. [address] [phone]* (retail)       [place], [place]  [id number]       Ph: [phone]       Fax: [phone]   RxID:   [id number] LEVAQUIN 750 MG  TABS (LEVOFLOXACIN) One tablet by mouth daily x 5 days  #5 x 0   Entered and Authorized by:   Tanda Rockers MD   Signed by:   Tanda Rockers MD on 09/29/2009   Method used:   Electronically to        [facility]. [address] [phone]* (retail)       [address] [place], [place]  [id number]       Ph: [phone]       Fax: [phone]   [place]:   [phone]

## 2010-03-16 NOTE — Assessment & Plan Note (Signed)
Summary: Pulmonary/ ext f/u ov, much better on gerd rx   Copy to:  Self Primary Provider/Referring Provider:  none  CC:  2 wk followup with cxr.  Pt states that her breathing is some improved.  She states that she still has occ SOB and chest tightness that comes "out of the blue".  .  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  Mar 02 2009 flew to [facility] then the next day scratchy throat and cough ? allergy to dog few back home next day felt tired then after sleeping woke up 1 am severe sob, wheezing > [place] [facility] with rx neb better enough to go home but next day readmitted x 11 days rx with tamiflu  > at discharge still weak, still sob and wheezing heart racing but  fever gone,  some cp right chest improving gradually.  April 08, 2009 cc still intermittent cp beneath right breath but much better than it was, short of breath speaking and quite hoarse some short of breath grocery stop. Imp was ? vcd/gerd rec Acid reflux is a leading suspect here (for your hoarseness and throat irritation and needs to be eliminated  completely before considering additional studies or treatment options. To suppress this maximally, take  prilosec 30 min  before first  meal and pepcid 20 mg (otc) at bedtime plus diet measures as listed.  Stop symbicort    April 22, 2009 2 wk followup with cxr.  Pt states that her breathing is some improved. not needing any inhaler .  She states that she still has occ SOB and chest tightness that comes "out of the blue" resolves without treatment in 10 minutes assoc with migratory upper abd fullness, never occurs supine.  no doe. Pt denies any significant  sore throat, dysphagia, itching, sneezing,  nasal congestion or excess secretions,  fever, chills, sweats, unintended wt loss, pleuritic or exertional cp, hempoptysis, change in activity tolerance  orthopnea pnd or leg swelling. Pt also denies any obvious fluctuation in symptoms with weather or environmental change or other alleviating or aggravating factors.       Current Medications (verified): 1)  Synthroid 75 Mcg Tabs (Levothyroxine Sodium) .Marland Kitchen.. 1 Once Daily 2)  Paxil Cr 25 Mg Xr24h-Tab (Paroxetine Hcl) .Marland Kitchen.. 1 Two Times A Day 3)  Singulair 10 Mg Tabs (Montelukast Sodium) .Marland Kitchen.. 1 Once Daily 4)  Metformin Hcl 500 Mg Tabs (Metformin Hcl) .Marland Kitchen.. 1 Two Times A Day 5)  Hydrochlorothiazide 25 Mg Tabs (Hydrochlorothiazide) .Marland Kitchen.. 1 Once Daily 6)  Xopenex Hfa 45 Mcg/act Aero (Levalbuterol Tartrate) .... 2 Puffs Every 6 Hours If Needed 7)  Potassium (? Strength) .Marland Kitchen.. 1 Two Times A Day 8)  Xanax 0.25 Mg Tabs (Alprazolam) .Marland Kitchen.. 1 Once Daily As Needed 9)  Ambien 10 Mg Tabs (Zolpidem Tartrate) .Marland Kitchen.. 1 At Bedtime As Needed 10)  Pepcid 20 Mg Tabs (Famotidine) .Marland Kitchen.. 1 At Bedtime  Allergies (verified): 1)  ! Pcn 2)  ! Sulfa  Past History:  Past Medical History: Diabetes, Type 2 Hypertension Sleep Apnea Hyperlipidemia Pulmonary Fibrosis after Acute Lung Injury ? CAP  02/2009    - CT chest   03/28/09 c/w PF with baseline cxr nl 03/09/09    - f/u PFT's rec April 22, 2009   Past Surgical History: Cholecystectomy 1992 Tubal ligation 1988 Newborn GI surgery for organs outside the abd  Vital Signs:  Patient profile:   55 year old female Weight:      210 pounds O2 Sat:      96 % on Room air Temp:     98.0 degrees F oral Pulse rate:   60 / minute BP sitting:   144 / 80  (left arm) Cuff size:   large  Vitals Entered By: Tilden Dome (April 22, 2009 10:50 AM)  O2 Flow:  Room air  Physical Exam  Additional Exam:  obese pleasant amb minimally anxious wf nad wt 207 April 08, 2009  >210 April 22, 2009  HEENT: nl dentition, turbinates, and orophanx. Nl external ear canals without cough reflex NECK :  without JVD/Nodes/TM/ nl carotid upstrokes bilaterally LUNGS: no acc muscle use, clear to A and P bilaterally without cough on insp or exp maneuvers CV:  RRR  no s3 or murmur or increase in P2, no edema  ABD:  soft and nontender with nl excursion in the supine position. No bruits or organomegaly, bowel sounds nl MS:  warm without deformities, calf tenderness, cyanosis or clubbing      CXR  Procedure date:  04/22/2009  Findings:        Findings: The patient is rotated rightward.  Normal mediastinum and heart silhouette.  There is an bronchitic markings at the lung bases.  No evidence effusion, infiltrate, or pneumothorax.  No acute osseous findings. There is asymmetry in the right breast.   IMPRESSION:   1.  No acute cardiopulmonary process. 2.  Mild bronchitic change.  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  Rassured pt that this is part of the natural hx of lung repair with an excellent prognosis based on improvement to date ( < 6 weeks since onset of symptoms and esr nl )  REc conservative rx with f/u cxr and pft's  Orders: Est. Patient Level IV (08657)  Problem # 2:  ABDOMINAL PAIN, UNSPECIFIED (ICD-789.00) Classic subdiaphragmatic pain pattern suggests ibs:  daytime, not exacerbated by ex or coughing, worse in sitting position, associated with generalized abd bloating, not present supine due to the dome effect of the diaphram canceled in that position.   Try citrucel and refer to GI at pts request Orders: Gastroenterology Referral (GI) Est. Patient Level IV (84696)  Medications Added to Medication List This Visit: 1)  Potassium (? Strength)  .Marland Kitchen.. 1 two times a day 2)  Xanax 0.25 Mg Tabs (Alprazolam) .Marland Kitchen.. 1 once daily as needed 3)  Ambien 10 Mg Tabs  (Zolpidem tartrate) .Marland Kitchen.. 1 at bedtime as needed 4)  Pepcid 20 Mg Tabs (Famotidine) .Marland Kitchen.. 1 at bedtime 5)  Prilosec Otc 20 Mg Tbec (Omeprazole magnesium) .... Take  one 30-60 min before first meal of the day  Patient Instructions: 1)  Only use proaire if needed  for short of breath and if you need more proaire we need to switch for xopenex 2)  Work on inhaler technique:  relax and blow all the way out then take a nice smooth deep breath back in, triggering the inhaler at same time you start breathing in 3)  Please schedule a follow-up appointment in 3 months with pft's and cxr  4)  add citrucel one twice daily with glass of water and avoid gassy foods 5)  See Patient Care Coordinator before leaving for GI referral

## 2010-03-16 NOTE — Assessment & Plan Note (Signed)
Summary: Pulmonary/ ext summary f/u ov with walking sats ok x 3 laps   Copy to:  Self Primary Provider/Referring Provider:  [name] MD  CC:  Followup on cxr and pft's.  Pt states that overall her breathing is the same- gets SOB some with talking and notices some wheezing when she lies down.  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  Mar 02 2009 flew to [facility] then the next day scratchy throat and cough ? allergy to dog few back home next day felt tired then after sleeping woke up 1 am severe sob, wheezing > [place] ER with rx neb better enough to go home but next day readmitted x 11 days rx with tamiflu  > at discharge still weak, still sob and wheezing heart racing but  fever gone,  some cp right chest improving gradually.  April 08, 2009 cc still intermittent cp beneath right breath but much better than it was, short of breath speaking and quite hoarse some short of breath grocery stop. Imp was ? vcd/gerd rec Acid reflux is a leading suspect here (for your hoarseness and throat irritation and needs to be eliminated  completely before considering additional studies or treatment options. To suppress this maximally, take  prilosec 30 min  before first  meal and pepcid 20 mg (otc) at bedtime plus diet measures as listed.  Stop symbicort    April 22, 2009 ov  breathing is some improved. not needing any inhaler .  She states that she still has occ SOB and chest tightness that comes "out of the blue" resolves without treatment in 10 minutes assoc with migratory upper abd fullness, never occurs supine.  no doe.  rec no change  rx  May 17, 2009 Acute visit.  Pt c/o prod cough with green sputum since 4/1.  She also c/o wheeznig at night when lies down. has not used saba however.  rec doxy/ prednisone and sinus ct if not better  see page 2 September 22, 2009 Followup on cxr and pft's.  Pt states that overall her breathing is the same- gets SOB some with talking and notices some wheezing when she lies down.  No new complaints today. uses cpap at night and this eliminates the wheezes.  better after rehab.  Pt denies any significant sore throat, dysphagia, itching, sneezing,  nasal congestion or excess secretions,  fever, chills, sweats, unintended wt loss, pleuritic or exertional cp, hempoptysis, change in activity tolerance  orthopnea pnd or leg swelling Pt also denies any obvious fluctuation in symptoms with weather or environmental change or other alleviating or aggravating factors.         Current Medications (verified): 1)  Synthroid 75 Mcg Tabs (Levothyroxine Sodium) .Marland Kitchen.. 1 Once Daily 2)  Paxil Cr 25 Mg Xr24h-Tab (Paroxetine Hcl) .Marland Kitchen.. 1 Two Times A Day 3)  Singulair 10 Mg Tabs (Montelukast Sodium) .Marland Kitchen.. 1 Once Daily 4)  Metformin Hcl 500 Mg Tabs (Metformin Hcl) .Marland Kitchen.. 1 Two Times A Day 5)  Hydrochlorothiazide 25 Mg Tabs (Hydrochlorothiazide) .Marland Kitchen.. 1 Once Daily 6)  Xanax 0.25 Mg Tabs (Alprazolam) .Marland KitchenMarland KitchenMarland Kitchen  1 Once Daily As Needed 7)  Ambien 10 Mg Tabs (Zolpidem Tartrate) .Marland Kitchen.. 1 At Bedtime As Needed 8)  Pepcid 20 Mg Tabs (Famotidine) .Marland Kitchen.. 1 At Bedtime 9)  Prilosec Otc 20 Mg Tbec (Omeprazole Magnesium) .... Take  One 30-60 Min Before First Meal of The Day  Allergies (verified): 1)  ! Pcn 2)  ! Sulfa  Past History:  Past Medical History: Morbid Obesity     - Target wt  =  185   for BMI < 30  Diabetes, Type 2 Hypertension Sleep Apnea Hyperlipidemia Pulmonary Fibrosis after Acute Lung Injury................................Marland KitchenWert      - ? CAP  02/2009    - CT chest [place] 03/28/09 c/w PF with baseline cxr nl 03/09/09     - f/u PFT's September 20, 2009  FEV1 1.55 (57%) ratio 83  with DLC0 58% and no desat x 3 laps    - Completed rehab June 2011  Born with [employer], rotated organs abdominal repair until age 58 Anxiety Disorder GERD Irritable Bowel Syndrome Obesity Small Bowel Obstruction  Vital Signs:  Patient profile:   55 year old female Menstrual status:  postmenopausal Weight:      206.13 pounds O2 Sat:      96 % on Room air Temp:     98.3 degrees F oral Pulse rate:   81 / minute BP sitting:   136 / 84  (left arm)  Vitals Entered By: Tilden Dome (September 22, 2009 11:05 AM)  O2 Flow:  Room air  Serial Vital Signs/Assessments:  Comments: 11:32 AM Ambulatory Pulse Oximetry  Resting; HR__85___    02 Sat__97%ra___  Lap1 (185 feet)   HR__110___   02 Sat__95%ra___ Lap2 (185 feet)   HR__118___   02 Sat__94%ra___    Lap3 (185 feet)   HR__120___   02 Sat__94%ra___  _x__Test Completed without Difficulty ___Test Stopped due to:   By: Tilden Dome    Physical Exam  Additional Exam:  obese sopmber amb minimally anxious wf nad wt 207 April 08, 2009  >210 April 22, 2009 > 211 May 17, 2009 > 206 September 22, 2009  HEENT: nl dentition, turbinates, and orophanx. Nl external ear canals without cough reflex NECK :  without JVD/Nodes/TM/ nl carotid upstrokes bilaterally LUNGS: no acc muscle use, clear to A and P bilaterally without cough on insp or exp maneuvers CV:  RRR  no s3 or murmur or increase in P2, no edema  ABD:  soft and nontender with nl excursion in the supine position. No bruits or organomegaly, bowel sounds nl MS:  warm without deformities, calf tenderness, cyanosis or clubbing      CXR  Procedure date:  09/22/2009  Findings:      Unusual cardiac configuration which overall is not enlarged.  There is no heart failure.   Density in the right lung base is unchanged and likely represents overlying breast tissue and scarring.    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 Mod restrictive changes by PFT's but no desats walking and we're now 6 months after initial injury  I had an extended discussion with the patient today lasting 15 to 20 minutes of a 25 minute visit on the following issues:  Natural hx of ALI and it's disctinction from other forms of PF in terms of causation and prognosis.  She should do fine with rehab and wt loss an no further pumonary f/u unless deteriorates  Orders: Est. Patient Level IV (99214) Pulse Oximetry, Ambulatory (28366)  Problem # 2:  WEIGHT GAIN, ABNORMAL (ICD-783.1)   Main finding on pft's is disproportionate reduction in ERV   Weight control is a matter of calorie balance which needs to be tilted in the pt's favor by eating less and exercising more.  Specifically, I recommended  exercise at a level where pt  is short of breath but not out of breath 30 minutes daily.  If not losing weight on this program, I would strongly recommend pt see a nutritionist with a food diary recorded for two weeks prior to the visit.   next step in w/u is CPST with before and after spirometry  Orders: Est. Patient Level IV (29476)  Problem # 3:  COUGH (ICD-786.2)  May of her symptoms suggest upper airway source.  Insp loop nl by pft's.  Rx GERD should help.    See instructions for specific recommendations   Orders: Est. Patient Level IV (54650)  Patient Instructions: 1)  Weight control is simply a matter of calorie balance which needs to be tilted in your favor by eating less and exercising more.  To get the most out of exercise, you need to be continuously aware that you are short of breath, but never out of breath, for 30 minutes daily. As you improve, it will actually be easier for you to do the same amount in  30 minutes so always push to the level where you are short of breath.  If this does not result in gradual weight  reduction,  I recommend  a nutritionist for a food diary 2)  Your target wt = 185 3)  If not satisfied after a month call libby at [phone] for CPST with before and after PFTs

## 2010-03-16 NOTE — Assessment & Plan Note (Signed)
Summary: Nurse Visit:  B-12 injection  Nurse Visit  CC: Vitamin B-12 inj./kb   Allergies: 1)  ! Pcn 2)  ! Sulfa  Medication Administration  Injection # 1:    Medication: Vit B12 1000 mcg    Diagnosis: B12 DEFICIENCY (ICD-266.2)    Route: IM    Site: R deltoid    Exp Date: 06/13/2011    Lot #: [id number]    Mfr: [place]    Patient tolerated injection without complications    Given by: Ernestene Mention CMA (September 22, 2009 1:25 PM)  Orders Added: 1)  Vit B12 1000 mcg [J3420] 2)  Admin of Therapeutic Inj  intramuscular or subcutaneous [71292]

## 2010-03-16 NOTE — Progress Notes (Signed)
Summary: Ok fo rehab [facility] x1   Phone Note Call from Patient Call back at Home Phone [phone]   Caller: [place] Call For: wert Reason for Call: Talk to Nurse Summary of Call: would like to get a physician order to attend pulmonary rehab at [facility].  Please call pt on this, Initial call taken by: Zigmund Gottron,  April 29, 2009 11:59 AM  Follow-up for Phone Call        pt wants to attend pulmonary rehab. please advise. Malvern Bing CMA  April 29, 2009 12:03 PM generally not needed for this kind of problem but if dx code 13 will work I'm ok approving her for it Follow-up by: Tanda Rockers MD,  April 29, 2009 1:33 PM    Additional Follow-up for Phone Call Additional follow up Details #2::    called pt's home number - [place] RN  April 29, 2009 2:43 PM   pt aware of MW's recs.  order send to [facility] for pulm rehab.  Matthew Folks LPN  April 29, 228 1:72 PM

## 2010-03-16 NOTE — Assessment & Plan Note (Signed)
Summary: 1 mos f/u #/cd   Vital Signs:  Patient profile:   55 year old female Menstrual status:  postmenopausal Height:      67 inches Weight:      214 pounds BMI:     33.64 O2 Sat:      96 % on Room air Temp:     98.2 degrees F oral Pulse rate:   56 / minute Pulse rhythm:   regular Resp:     16 per minute BP sitting:   100 / 60  (left arm) Cuff size:   large  Vitals Entered By: Estell Harpin CMA (December 20, 2009 11:35 AM)  Nutrition Counseling: Patient's BMI is greater than 25 and therefore counseled on weight management options.  O2 Flow:  Room air  Primary Care Provider:  [name] MD   History of Present Illness:  Follow-Up Visit      This is a 55 year old woman who presents for Follow-up visit.  The patient denies chest pain, palpitations, dizziness, syncope, low blood sugar symptoms, high blood sugar symptoms, edema, SOB, DOE, PND, and orthopnea.  Since the last visit the patient notes no new problems or concerns.  The patient reports taking meds as prescribed, monitoring blood sugars, and dietary noncompliance.  When questioned about possible medication side effects, the patient notes none.    Preventive Screening-Counseling & Management  Alcohol-Tobacco     Alcohol drinks/day: 0     Alcohol Counseling: not indicated; patient does not drink     Smoking Status: never     Tobacco Counseling: not indicated; no tobacco use  Hep-HIV-STD-Contraception     Hepatitis Risk: no risk noted     HIV Risk: no risk noted     STD Risk: no risk noted      Sexual History:  currently monogamous.        Drug Use:  never.        Blood Transfusions:  no.    Clinical Review Panels:  Prevention   Last Mammogram:  normal (01/12/2009)   Last Pap Smear:  normal (01/12/2009)   Last Colonoscopy:  DONE (06/16/2009)  Lipid Management   Cholesterol:  206 (06/20/2009)   HDL (good cholesterol):  39.30 (06/20/2009)  Diabetes Management   HgBA1C:  6.4 (11/21/2009)  Creatinine:  0.7 (11/21/2009)   Last Dilated Eye Exam:  normal (09/23/2008)   Last Foot Exam:  yes (11/21/2009)  CBC   WBC:  8.3 (11/12/2009)   RBC:  4.16 (11/12/2009)   Hgb:  12.7 (11/12/2009)   Hct:  37.6 (11/12/2009)   Platelets:  239 (11/12/2009)   MCV  90 (11/12/2009)   MCHC  34.6 (06/20/2009)   RDW  14.7 (11/12/2009)   PMN:  56.4 (11/12/2009)   Lymphs:  35.6 (06/20/2009)   Monos:  6.0 (11/12/2009)   Eosinophils:  2.7 (11/12/2009)   Basophil:  0.4 (11/12/2009)  Complete Metabolic Panel   Glucose:  133 (11/21/2009)   Sodium:  141 (11/21/2009)   Potassium:  3.5 (11/21/2009)   Chloride:  103 (11/21/2009)   CO2:  29 (11/21/2009)   BUN:  11 (11/21/2009)   Creatinine:  0.7 (11/21/2009)   Albumin:  4.3 (06/20/2009)   Total Protein:  7.0 (06/20/2009)   Calcium:  9.9 (11/21/2009)   Total Bili:  0.6 (06/20/2009)   Alk Phos:  62 (06/20/2009)   SGPT (ALT):  38 (06/20/2009)   SGOT (AST):  30 (06/20/2009)   Medications Prior to Update: 1)  Synthroid 75  Mcg Tabs (Levothyroxine Sodium) .Marland Kitchen.. 1 Once Daily 2)  Paxil Cr 25 Mg Xr24h-Tab (Paroxetine Hcl) .Marland Kitchen.. 1 Two Times A Day 3)  Singulair 10 Mg Tabs (Montelukast Sodium) .Marland Kitchen.. 1 Once Daily 4)  Metformin Hcl 500 Mg Tabs (Metformin Hcl) .Marland Kitchen.. 1 Two Times A Day 5)  Hydrochlorothiazide 25 Mg Tabs (Hydrochlorothiazide) .Marland Kitchen.. 1 Once Daily 6)  Xanax 0.25 Mg Tabs (Alprazolam) .Marland Kitchen.. 1 Once Daily As Needed 7)  Ambien 10 Mg Tabs (Zolpidem Tartrate) .Marland Kitchen.. 1 At Bedtime As Needed 8)  Nascobal 500 Mcg/0.8m Soln (Cyanocobalamin) .... One Puff in A Nostril Weekly  Current Medications (verified): 1)  Synthroid 75 Mcg Tabs (Levothyroxine Sodium) ..Marland Kitchen. 1 Once Daily 2)  Paxil Cr 25 Mg Xr24h-Tab (Paroxetine Hcl) ..Marland Kitchen. 1 Two Times A Day 3)  Singulair 10 Mg Tabs (Montelukast Sodium) ..Marland Kitchen. 1 Once Daily 4)  Metformin Hcl 500 Mg Tabs (Metformin Hcl) ..Marland Kitchen. 1 Two Times A Day 5)  Hydrochlorothiazide 25 Mg Tabs (Hydrochlorothiazide) ..Marland Kitchen. 1 Once Daily 6)  Xanax 0.25 Mg  Tabs (Alprazolam) ..Marland Kitchen. 1 Once Daily As Needed 7)  Ambien 10 Mg Tabs (Zolpidem Tartrate) ..Marland Kitchen. 1 At Bedtime As Needed 8)  Nascobal 500 Mcg/0.135mSoln (Cyanocobalamin) .... One Puff in A Nostril Weekly  Allergies (verified): 1)  ! Pcn 2)  ! Sulfa  Past History:  Past Medical History: Last updated: 09/29/2009 Morbid Obesity     - Target wt  =  185   for BMI < 30  Diabetes, Type 2 Hypertension Sleep Apnea Hyperlipidemia Pulmonary Fibrosis after Acute Lung Injury.................................WMarland Kitchenrt      - ? CAP  02/2009    - CT chest 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 Social ETOH Daily Caffeine Use 2 Regular exercise-yes  Risk Factors: Alcohol Use: 0 (12/20/2009) Exercise: yes (06/20/2009)  Risk Factors: Smoking Status: never (12/20/2009)  Family History: Reviewed history from 06/20/2009 and no changes required. No FH of Colon Cancer: Lymphoma: Mother Family History of Diabetes: Father, Son no goiter or other thyroid probs type 2 dm: father type 1 dm:  son  Social History: Reviewed history from 06/20/2009 and no changes  required. 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 Social ETOH Daily Caffeine Use 2 Regular exercise-yes  Review of Systems       The patient complains of weight gain.  The patient denies anorexia, fever, weight loss, chest pain, syncope, dyspnea on exertion, prolonged cough, headaches, hemoptysis, abdominal pain, hematuria, suspicious skin lesions, transient blindness, and enlarged lymph nodes.   General:  Denies chills, fatigue, fever, loss of appetite, malaise, sleep disorder, and sweats. GU:  Denies discharge, dysuria, hematuria, incontinence, urinary frequency, and urinary hesitancy. Endo:  Denies cold intolerance, excessive hunger, excessive thirst, excessive urination, heat intolerance, polyuria, and weight change. Heme:  Denies abnormal bruising, bleeding, enlarge lymph nodes, fevers, pallor, and skin discoloration.  Physical Exam  General:  alert, well-developed, well-nourished, well-hydrated, appropriate dress, normal appearance, healthy-appearing, cooperative to examination, good hygiene, and overweight-appearing.   Head:  normocephalic, atraumatic, no abnormalities observed, and no abnormalities palpated.   Mouth:  Oral mucosa and oropharynx without lesions or exudates.  Teeth in good repair. Neck:  supple, full ROM, no masses, no thyromegaly, no thyroid nodules or tenderness, no JVD, normal carotid upstroke, no carotid bruits, no cervical lymphadenopathy, and no neck tenderness.   Lungs:  normal respiratory effort, no intercostal retractions, no accessory muscle use, normal breath sounds, no dullness, no fremitus, no crackles, and no wheezes.   Heart:  normal rate, regular rhythm, no murmur, no gallop, no rub, and no JVD.   Abdomen:  soft, non-tender, normal bowel sounds, no distention, no masses, no guarding, no rigidity, no rebound tenderness, no abdominal hernia, no inguinal hernia, no hepatomegaly, no splenomegaly, and  abdominal scar(s).  No CVAT. Msk:  normal ROM, no joint tenderness, no joint swelling, no joint warmth, no redness over joints, no joint deformities, no joint instability, and no crepitation.   Pulses:  R and L carotid,radial,femoral,dorsalis pedis and posterior tibial pulses are full and equal bilaterally Extremities:  trace left pedal edema and trace right pedal edema.  trace left pedal edema.   Neurologic:  No cranial nerve deficits noted. Station and gait are normal. Plantar reflexes are down-going bilaterally. DTRs are symmetrical throughout. Sensory, motor and coordinative functions appear intact. Skin:  Intact without suspicious lesions or rashes Cervical Nodes:  no anterior cervical adenopathy and no posterior cervical adenopathy.  no anterior cervical adenopathy and no posterior cervical adenopathy.   Psych:  Cognition and judgment appear intact. Alert and cooperative with normal attention span and concentration. No apparent delusions, illusions, hallucinations   Impression & Recommendations:  Problem # 1:  UTI (ICD-599.0) Assessment Unchanged  Orders: Venipuncture (16109) TLB-BMP (Basic Metabolic Panel-BMET) ([id number]) TLB-CBC Platelet - w/Differential (85025-CBCD) TLB-TSH (Thyroid Stimulating Hormone) (84443-TSH) TLB-A1C / Hgb A1C (Glycohemoglobin) (83036-A1C) T-Urine Culture (Spectrum Order) ([id number]) TLB-Udip w/ Micro (81001-URINE)  Problem # 2:  HYPOKALEMIA (ICD-276.8) Assessment: Unchanged  Orders: Venipuncture (08657) TLB-BMP (Basic Metabolic Panel-BMET) ([id number]) TLB-CBC Platelet - w/Differential (85025-CBCD) TLB-TSH (Thyroid Stimulating Hormone) (84443-TSH) TLB-A1C / Hgb A1C (Glycohemoglobin) (83036-A1C) T-Urine Culture (Spectrum Order) ([id number]) TLB-Udip w/ Micro (81001-URINE)  Problem # 3:  HYPOTHYROIDISM (ICD-244.9) Assessment: Unchanged  Her updated medication list for this problem includes:    Synthroid 75 Mcg Tabs (Levothyroxine  sodium) .Marland Kitchen... 1 once daily  Orders: Venipuncture (53664) TLB-BMP (Basic Metabolic Panel-BMET) ([id number]) TLB-CBC Platelet - w/Differential (85025-CBCD) TLB-TSH (Thyroid Stimulating Hormone) (84443-TSH) TLB-A1C / Hgb A1C (Glycohemoglobin) (83036-A1C) T-Urine Culture (Spectrum Order) [id number]) TLB-Udip w/ Micro (81001-URINE)  Labs Reviewed: TSH: 1.51 (11/21/2009)    HgBA1c: 6.4 (11/21/2009) Chol: 206 (06/20/2009)   HDL: 39.30 (06/20/2009)   TG: 209.0 (06/20/2009)  Her updated medication list for this problem includes:    Synthroid 75 Mcg Tabs (Levothyroxine sodium) .Marland Kitchen... 1 once daily  Problem # 4:  HYPERTENSION (ICD-401.9) Assessment: Improved  Her updated medication list for this problem includes:    Hydrochlorothiazide 25 Mg Tabs (Hydrochlorothiazide) .Marland Kitchen... 1 once daily  Orders: Venipuncture (18841) TLB-BMP (Basic Metabolic Panel-BMET) ([id number]) TLB-CBC Platelet - w/Differential (85025-CBCD) TLB-TSH (Thyroid Stimulating Hormone) (84443-TSH) TLB-A1C /  Hgb A1C (Glycohemoglobin) (83036-A1C) T-Urine Culture (Spectrum Order) [id number]) TLB-Udip w/ Micro (81001-URINE)  BP today: 100/60 Prior BP: 128/86 (11/21/2009)  Prior 10 Yr Risk Heart Disease: Not enough information (06/20/2009)  Labs Reviewed: K+: 3.5 (11/21/2009) Creat: : 0.7 (11/21/2009)   Chol: 206 (06/20/2009)   HDL: 39.30 (06/20/2009)   TG: 209.0 (06/20/2009)  Her updated medication list for this problem includes:    Hydrochlorothiazide 25 Mg Tabs (Hydrochlorothiazide) .Marland Kitchen... 1 once daily  Problem # 5:  DIABETES, TYPE 2 (ICD-250.00) Assessment: Unchanged  Her updated medication list for this problem includes:    Metformin Hcl 500 Mg Tabs (Metformin hcl) .Marland Kitchen... 1 two times a day  Orders: Venipuncture (51025) TLB-BMP (Basic Metabolic Panel-BMET) ([id number]) TLB-CBC Platelet - w/Differential (85025-CBCD) TLB-TSH (Thyroid Stimulating Hormone) (84443-TSH) TLB-A1C / Hgb A1C (Glycohemoglobin)  (83036-A1C) T-Urine Culture (Spectrum Order) [id number]) TLB-Udip w/ Micro (81001-URINE)  Labs Reviewed: Creat: 0.7 (11/21/2009)     Last Eye Exam: normal (09/23/2008) Reviewed HgBA1c results: 6.4 (11/21/2009)  5.8 (06/20/2009)  Problem # 6:  B12 DEFICIENCY (ICD-266.2) Assessment: Unchanged  Complete Medication List: 1)  Synthroid 75 Mcg Tabs (Levothyroxine sodium) .Marland Kitchen.. 1 once daily 2)  Paxil Cr 25 Mg Xr24h-tab (Paroxetine hcl) .Marland Kitchen.. 1 two times a day 3)  Singulair 10 Mg Tabs (Montelukast sodium) .Marland Kitchen.. 1 once daily 4)  Metformin Hcl 500 Mg Tabs (Metformin hcl) .Marland Kitchen.. 1 two times a day 5)  Hydrochlorothiazide 25 Mg Tabs (Hydrochlorothiazide) .Marland Kitchen.. 1 once daily 6)  Xanax 0.25 Mg Tabs (Alprazolam) .Marland Kitchen.. 1 once daily as needed 7)  Ambien 10 Mg Tabs (Zolpidem tartrate) .Marland Kitchen.. 1 at bedtime as needed 8)  Nascobal 500 Mcg/0.32m Soln (Cyanocobalamin) .... One puff in a nostril weekly  Other Orders: Specimen Handling (99000)  Patient Instructions: 1)  It is important that you exercise regularly at least 20 minutes 5 times a week. If you develop chest pain, have severe difficulty breathing, or feel very tired , stop exercising immediately and seek medical attention. 2)  You need to lose weight. Consider a lower calorie diet and regular exercise.  3)  Check your blood sugars regularly. If your readings are usually above 200 or below 70 you should contact our office. 4)  It is important that your Diabetic A1c level is checked every 3 months. 5)  See your eye doctor yearly to check for diabetic eye damage. 6)  Check your feet each night for sore areas, calluses or signs of infection. 7)  Check your Blood Pressure regularly. If it is above 130/80: you should make an appointment.   Orders Added: 1)  Venipuncture [[86761]2)  TLB-BMP (Basic Metabolic Panel-BMET) [[[id number]]3)  TLB-CBC Platelet - w/Differential [85025-CBCD] 4)  TLB-TSH (Thyroid Stimulating Hormone) [84443-TSH] 5)  TLB-A1C / Hgb  A1C (Glycohemoglobin) [83036-A1C] 6)  T-Urine Culture (Spectrum Order) [[[id number]]7)  TLB-Udip w/ Micro [81001-URINE] 8)  Est. Patient Level V [[05397]9)  Specimen Handling [99000]    Laboratory Results   Urine Tests  Date/Time Received: LEstell HarpinCMA  December 20, 2009 12:01 PM  Date/Time Reported: LEstell HarpinCMA  December 20, 2009 12:01 PM   Routine Urinalysis   Color: lt. yellow Appearance: Clear Glucose: negative   (Normal Range: Negative) Bilirubin: negative   (Normal Range: Negative) Ketone: negative   (Normal Range: Negative) Blood: trace-lysed   (Normal Range: Negative) pH: 6.5   (Normal Range: 5.0-8.0) Protein: negative   (Normal Range: Negative) Urobilinogen: 0.2   (Normal Range: 0-1) Nitrite: negative   (Normal  Range: Negative) Leukocyte Esterace: trace   (Normal Range: Negative)

## 2010-03-29 ENCOUNTER — Ambulatory Visit (INDEPENDENT_AMBULATORY_CARE_PROVIDER_SITE_OTHER)
Admission: RE | Admit: 2010-03-29 | Discharge: 2010-03-29 | Disposition: A | Discharge status: Home / Self Care | Payer: Managed Care, Other (non HMO) | Source: Ambulatory Visit | Attending: Internal Medicine | Admitting: Internal Medicine

## 2010-03-29 ENCOUNTER — Ambulatory Visit (INDEPENDENT_AMBULATORY_CARE_PROVIDER_SITE_OTHER): Payer: Managed Care, Other (non HMO) | Admitting: Internal Medicine

## 2010-03-29 ENCOUNTER — Telehealth: Payer: Self-pay | Admitting: Internal Medicine

## 2010-03-29 ENCOUNTER — Encounter: Payer: Self-pay | Admitting: Internal Medicine

## 2010-03-29 ENCOUNTER — Other Ambulatory Visit: Payer: Self-pay | Admitting: Internal Medicine

## 2010-03-29 DIAGNOSIS — J8289 Other pulmonary eosinophilia, not elsewhere classified: Secondary | ICD-10-CM

## 2010-03-29 DIAGNOSIS — R05 Cough: Secondary | ICD-10-CM | POA: Insufficient documentation

## 2010-03-29 DIAGNOSIS — R059 Cough, unspecified: Secondary | ICD-10-CM

## 2010-03-30 NOTE — Letter (Signed)
Summary: Rheumatology/[facility]  Rheumatology/[facility]   Imported By: Phillis Knack 03/22/2010 11:19:04  _____________________________________________________________________  External Attachment:    Type:   Image     Comment:   External Document

## 2010-04-05 NOTE — Assessment & Plan Note (Signed)
Summary: Pulmonary/ uri with flare of vcd   Copy to:  Self Primary Provider/Referring Provider:  [name] MD  CC:  Wheezing and increased SOB x 2 days.  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  Mar 02 2009 flew to [facility] then the next day scratchy throat and cough ? allergy to dog few back home next day felt tired then after sleeping woke up 1 am severe sob, wheezing > [place] [facility] with rx neb better enough to go home but next day readmitted x 11 days rx with tamiflu  > at discharge still weak, still sob and wheezing heart racing but  fever gone,  some cp right chest improving gradually.  April 08, 2009 cc still intermittent cp beneath right breast but much better than it was, short of breath speaking and quite hoarse some short of breath grocery stop. Imp was ? vcd/gerd rec Acid reflux is a leading suspect here (for your hoarseness and throat irritation and needs to be eliminated  completely before considering additional studies or treatment options. To suppress this maximally, take  prilosec 30 min  before first  meal and pepcid 20 mg (otc) at bedtime plus diet measures as listed.  Stop symbicort    April 22, 2009 ov  breathing is some improved. not needing any inhaler .  She states that she still has occ SOB and chest tightness that comes "out of the blue" resolves without treatment in 10 minutes assoc with migratory upper abd fullness, never occurs supine.  no doe.  rec citrucel one tsp two times a day   May 17, 2009 Acute visit.  Pt c/o prod cough with green sputum since 4/1.  She also c/o wheeznig at night when lies down. has not used saba however.   rec doxy/ prednisone and sinus ct if not better  see page 2 September 22, 2009 Followup on cxr and pft's.  Pt states that overall her breathing is the same- gets SOB some with talking and notices some wheezing when she lies down.  No new complaints today. uses cpap at night and this eliminates the wheezes.  better after rehab.  rec wt loss.  September 29, 2009 ov cc increased sob with exertion and at rest...RUQ and back pain...fatigue...wheezing...hoarseness and non-shaking chills.  All symptoms started 2 days ago, better lying flat, cp same as prev attributed to gerd vs IBS/ adhesions, not using citrucel as prev rec. Levaquin 750 one daily x 5 days Tramadol 50 mg 1-2 every 4 hours as needed Citrucel 1 tsp twice daily in large glass or water  >  improved to her satisfaction   March 29, 2010 ov cc sob.   until 3 days prior to ov was doing" the best in years" on just singulair.  then abupt onset achy, chills, sob, wheezing,  cough dry, similar pain as in past in ruq and flank rx with tylenol no better.  Pt denies any significant sore throat, dysphagia, itching, sneezing,  nasal congestion or excess or purulent secretions,   soaking  sweats, unintended wt loss, classically pleuritic or exertional cp, hempoptysis, change in activity tolerance  orthopnea pnd or leg swelling Pt also denies any obvious fluctuation in symptoms with weather or environmental change or other alleviating or aggravating factors.           Current Medications (verified): 1)  Synthroid 75 Mcg Tabs (Levothyroxine Sodium) .Marland Kitchen.. 1 Once Daily 2)  Paxil Cr 25 Mg Xr24h-Tab (Paroxetine Hcl) .Marland Kitchen.. 1 Two Times A Day 3)  Singulair 10 Mg Tabs (Montelukast Sodium) .Marland Kitchen.. 1 Once Daily 4)  Metformin Hcl 500 Mg Tabs (Metformin Hcl) .Marland Kitchen.. 1 Two Times A Day 5)  Hydrochlorothiazide 25 Mg Tabs (Hydrochlorothiazide) .Marland Kitchen.. 1 Once Daily 6)  Xanax 0.25 Mg Tabs (Alprazolam) .Marland Kitchen.. 1 Once Daily As Needed 7)  Ambien 10 Mg Tabs (Zolpidem Tartrate) .Marland Kitchen.. 1 At  Bedtime As Needed 8)  Nascobal 500 Mcg/0.18m Soln (Cyanocobalamin) .... One Puff in A Nostril Weekly 9)  Multivitamins  Tabs (Multiple Vitamin) ..Marland Kitchen. 1 Once Daily 10)  Tylenol 325 Mg Tabs (Acetaminophen) .... As Directed Per Bottle As Needed  Allergies (verified): 1)  ! Pcn 2)  ! Sulfa  Past History:  Past Medical History: Morbid Obesity     - Target wt  =  185   for BMI < 30  Diabetes, Type 2 Hypertension Sleep Apnea Hyperlipidemia Pulmonary Fibrosis after Acute Lung Injury...................................Marland Kitchenert      - ? CAP  02/2009    - CT chest [place] 03/28/09 c/w PF with baseline cxr nl 03/09/09    - f/u PFT's September 20, 2009  FEV1 1.55 (57%) ratio 83  with DLC0 58% and no desat x 3 laps    - Completed rehab June 2011  Born with [employer] rotated organs abdominal repair until age 55    - sp Small Bowel Obstruction, resolved with NG/ bowel rest around 2009 Anxiety Disorder GERD Irritable Bowel Syndrome Obesity  Clinical Reports Reviewed:  CXR:  09/29/2009: CXR Results:  Comparison: 09/20/2009, 04/22/2009.   Findings: Unusual cardiac contour re-identified.  Prominence of the right heart border.  Suggestion of mild cardiomegaly on the lateral view. Other mediastinal contours are within normal limits. Visualized tracheal air column is within normal limits.  No pneumothorax, pulmonary edema, pleural effusion or consolidation. Eventration of the diaphragm.  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   IMPRESSION: 1.  Stable. No acute cardiopulmonary abnormality. 2.  Suggestion of cardiomegaly with prominent right heart border, and possible right cardiophrenic angle mass, which is nonspecific but may represent a pericardial cyst (not present on CT chest 03/2009)  09/22/2009: CXR Results:  Unusual cardiac configuration which overall is not enlarged.  There is no heart failure.   Density in the right lung  base is unchanged and likely represents overlying breast tissue and scarring.    04/22/2009: CXR Results:    Findings: The patient is rotated rightward.  Normal mediastinum and heart silhouette.  There is an bronchitic markings at the lung bases.  No evidence effusion, infiltrate, or pneumothorax.  No acute osseous findings. There is asymmetry in the right breast.   IMPRESSION:   1.  No acute cardiopulmonary process. 2.  Mild bronchitic change.   Vital Signs:  Patient profile:   55year old female Menstrual status:  postmenopausal Weight:      213 pounds O2 Sat:      94 % on Room air Temp:     99.1 degrees F oral Pulse rate:   111 / minute BP sitting:   126 / 86  (left  arm) Cuff size:   large  Vitals Entered By: Tilden Dome (March 29, 2010 10:08 AM)  O2 Flow:  Room air  Physical Exam  Additional Exam:  obese sopmber amb minimally anxious wf nad wt 207 April 08, 2009  >210 April 22, 2009 > 213 March 29, 2010  HEENT: nl dentition, turbinates, and orophanx. Nl external ear canals without cough reflex NECK :  without JVD/Nodes/TM/ nl carotid upstrokes bilaterally LUNGS: no acc muscle use, clear to A and P bilaterally without cough on insp or exp maneuvers CV:  RRR  no s3 or murmur or increase in P2, no edema  ABD:  soft mod distended multiple scars with limited  excursion in the supine position. No bruits or organomegaly, bowel sounds nl MS:  warm without deformities, calf tenderness, cyanosis or clubbing      Impression & Recommendations:  Problem # 1:  COUGH (ICD-786.2)  Explained natural h/o uri and why it's necessary in patients at risk to rx short term with PPI to reduce risk of evolving cyclical cough triggered by epithelial injury and a heightened sensitivty to the effects of any upper airway irritants,  most importantly acid - related   Rx with factive and f/u planned  Problem # 2:  PULMONARY INFILTRATE INCLUDES (EOSINOPHILIA) (ICD-518.3) There is  a vague R cardiophrenic density suggestive of a cyst or hernia not reported on previous CT so she'll need repeat CT p rx for pneumonia;  placed in tickle file for recall  Medications Added to Medication List This Visit: 1)  Multivitamins Tabs (Multiple vitamin) .Marland Kitchen.. 1 once daily 2)  Tylenol 325 Mg Tabs (Acetaminophen) .... As directed per bottle as needed 3)  Factive 320 Mg Tabs (Gemifloxacin mesylate) .... One tablet by mouth daily 4)  Tramadol Hcl 50 Mg Tabs (Tramadol hcl) .... One to two by mouth every 4-6 hours  Other Orders: T-2 View CXR (71020TC) Est. Patient Level IV (77824)  Patient Instructions: 1)  Stop hctz and take gatorade to quench thirsty 2)  Prilosec before bfast and pepcid 20 mg at bedtime as long as coughing ( reflux is to cough what oxygen is to fire)  3)  GERD (REFLUX)  is a common cause of respiratory symptoms. It commonly presents without heartburn and can be treated with medication, but also with lifestyle changes including avoidance of late meals, excessive alcohol, smoking cessation, and avoid fatty foods, chocolate, peppermint, colas, red wine, and acidic juices such as orange juice. NO MINT OR MENTHOL PRODUCTS SO NO COUGH DROPS  4)  USE SUGARLESS CANDY INSTEAD (jolley ranchers)  5)  NO OIL BASED VITAMINS  6)  Take delsym two tsp every 12 hours and add tramadol 50 mg up to every 4 hours to suppress the urge to cough. Swallowing water or using ice chips/non mint and menthol containing candies (such as lifesavers or sugarless jolly ranchers) are also effective.  7)  Take Factive 320 x 7 days 8)  If better and cxr ok then no need to return 9)  No work until Friday the 17th of Feb Prescriptions: FACTIVE 320 MG  TABS (GEMIFLOXACIN MESYLATE) One tablet by mouth daily  #6 x 0   Entered and Authorized by:   Tanda Rockers MD   Signed by:   Tanda Rockers MD on 03/29/2010   Method used:   Electronically to        [facility] [phone]* (retail)       [place]  [address] [place]  [id number]       Ph: [phone]       Fax: [phone]   RxID:   [id number]    Appended Document: Pulmonary/ uri with flare of vcd cxr noted, needs f/u ov in 2 weeks (placed in tickle file)  Appended Document: Pulmonary/ uri with flare of vcd Pt aware.

## 2010-04-05 NOTE — Progress Notes (Signed)
Summary: pt sick-req appt.  Phone Note Call from Patient Call back at Home Phone [phone]   Caller: daughter-jillian Call For: Dontell Mian Reason for Call: Talk to Nurse Summary of Call: Patient's daughter calling requesting appt. for mother.  Symptoms: wheezing, sob, high fever.   Initial call taken by: Mateo Flow,  March 29, 2010 8:15 AM  Follow-up for Phone Call        Spoke with pt and she is c/o increased SOB, wheezing, dry cough, ever, body aches x 3 days. Pt is requestign an appt. Pt set to see MW today at 10:15. [place] Bing CMA  March 29, 2010 9:34 AM

## 2010-04-05 NOTE — Letter (Signed)
Summary: [facility]  [address], [place] [address]   Phone: [phone]  Fax: [phone]    03/29/2010  [facility] [address] [place], [place]  [address]  [place]  Dear Ms. Ostermann,   You are excused from work until 03/31/2010 (Friday).    Sincerely,   Christinia Gully MD

## 2010-04-06 ENCOUNTER — Emergency Department: Payer: Self-pay | Admitting: Emergency Medicine

## 2010-04-06 ENCOUNTER — Inpatient Hospital Stay: Payer: Self-pay | Admitting: *Deleted

## 2010-04-06 ENCOUNTER — Telehealth (INDEPENDENT_AMBULATORY_CARE_PROVIDER_SITE_OTHER): Payer: Self-pay | Admitting: *Deleted

## 2010-04-11 NOTE — Progress Notes (Signed)
Summary: patient has hives all over her-pt has been seen at [facility] today  Phone Note Call from Patient   Caller: Patient Call For: [facility] Summary of Call: Patient phoned she was in last week and he put her on antibiotic for seven days she took her last pill on Monday. she woke up this morning with hives all over her. She can be reached at [phone] Initial call taken by: Ozella Rocks,  April 06, 2010 8:36 AM  Follow-up for Phone Call        [email or web address] Tilden Dome  April 06, 2010 11:31 AM  Pt was seen at [facility] today for her hives and was given I.V. Benadryl and Solu Medrol. Sent home with RX for Atarax, Prednisone taper and Zantac. She was told to f/u with MW in a few weeks. Pt is sch for f/u on 04/28/2010 w/ MW. She will call if needing sooner appt.  Follow-up by: Francesca Jewett CMA,  April 06, 2010 4:36 PM     Appended Document: patient has hives all over her-pt has been seen at [facility] today list as allergic to factive  Appended Document: patient has hives all over her-pt has been seen at [facility] today    Clinical Lists Changes  Allergies: Added new allergy or adverse reaction of * FACTIVE

## 2010-04-12 ENCOUNTER — Ambulatory Visit: Payer: Self-pay | Admitting: Internal Medicine

## 2010-04-13 ENCOUNTER — Telehealth (INDEPENDENT_AMBULATORY_CARE_PROVIDER_SITE_OTHER): Payer: Self-pay | Admitting: *Deleted

## 2010-04-14 ENCOUNTER — Ambulatory Visit (INDEPENDENT_AMBULATORY_CARE_PROVIDER_SITE_OTHER): Payer: Managed Care, Other (non HMO) | Admitting: Internal Medicine

## 2010-04-14 ENCOUNTER — Encounter: Payer: Self-pay | Admitting: Internal Medicine

## 2010-04-14 ENCOUNTER — Ambulatory Visit (INDEPENDENT_AMBULATORY_CARE_PROVIDER_SITE_OTHER)
Admission: RE | Admit: 2010-04-14 | Discharge: 2010-04-14 | Disposition: A | Discharge status: Home / Self Care | Payer: Managed Care, Other (non HMO) | Source: Ambulatory Visit | Attending: Internal Medicine | Admitting: Internal Medicine

## 2010-04-14 ENCOUNTER — Other Ambulatory Visit: Payer: Self-pay | Admitting: Internal Medicine

## 2010-04-14 DIAGNOSIS — E119 Type 2 diabetes mellitus without complications: Secondary | ICD-10-CM

## 2010-04-14 DIAGNOSIS — R059 Cough, unspecified: Secondary | ICD-10-CM

## 2010-04-14 DIAGNOSIS — L270 Generalized skin eruption due to drugs and medicaments taken internally: Secondary | ICD-10-CM

## 2010-04-14 DIAGNOSIS — I10 Essential (primary) hypertension: Secondary | ICD-10-CM

## 2010-04-14 DIAGNOSIS — J168 Pneumonia due to other specified infectious organisms: Secondary | ICD-10-CM | POA: Insufficient documentation

## 2010-04-14 DIAGNOSIS — R05 Cough: Secondary | ICD-10-CM

## 2010-04-14 DIAGNOSIS — E039 Hypothyroidism, unspecified: Secondary | ICD-10-CM

## 2010-04-14 LAB — CONVERTED CEMR LAB: Blood Glucose, Fingerstick: 102

## 2010-04-14 LAB — HM DIABETES FOOT EXAM

## 2010-04-17 ENCOUNTER — Telehealth: Payer: Self-pay | Admitting: Internal Medicine

## 2010-04-20 NOTE — Assessment & Plan Note (Signed)
Summary: post er/fu/lb   Vital Signs:  Patient profile:   55 year old female Menstrual status:  postmenopausal Height:      67 inches Weight:      209 pounds BMI:     32.85 O2 Sat:      96 % on Room air Temp:     98.6 degrees F oral Pulse rate:   90 / minute Pulse rhythm:   regular Resp:     16 per minute BP sitting:   140 / 88  (left arm) Cuff size:   large  Vitals Entered By: [place] (April 14, 2010 11:21 AM)  Nutrition Counseling: Patient's BMI is greater than 25 and therefore counseled on weight management options.  O2 Flow:  Room air  Primary Care Provider:  [name] MD   History of Present Illness: She returns for f/up and tells me that she has had a rough 2 weeks. On 02/22 she was seen by Dr. Melvyn Novas and treated for PNA with Haze Boyden, 2 days later she had a severe allergic skin reaction and was admitted to [facility] (none of those records are available to me today). She was treated with steroids and was discharged after 4 days. She is not able to return to work and she asks that I do FMLA paper work today. She feels jittery and nervous from the steroids and still has some larungitis, SOB, severe fatigue, and weakness.  Preventive Screening-Counseling & Management  Alcohol-Tobacco     Alcohol drinks/day: 0     Alcohol Counseling: not indicated; patient does not drink     Smoking Status: never     Tobacco Counseling: not indicated; no tobacco use  Hep-HIV-STD-Contraception     Hepatitis Risk: no risk noted     HIV Risk: no risk noted     STD Risk: no risk noted      Sexual History:  currently monogamous.        Drug Use:  never.        Blood Transfusions:  no.    Clinical Review Panels:  Prevention   Last Mammogram:  normal (01/12/2009)   Last Pap Smear:  normal (01/12/2009)   Last Colonoscopy:  DONE (06/16/2009)  Lipid Management   Cholesterol:  197 (02/02/2010)   LDL (bad choesterol):  98.8 (02/02/2010)   HDL (good cholesterol):  35.2  (02/02/2010)   Triglycerides:  315 (02/02/2010)  Diabetes Management   HgBA1C:  6.5 (12/20/2009)   Creatinine:  0.5 (12/20/2009)   Last Dilated Eye Exam:  normal (09/23/2008)   Last Foot Exam:  yes (04/14/2010)  CBC   WBC:  7.0 (02/07/2010)   RBC:  4.72 (02/07/2010)   Hgb:  14.0 (02/07/2010)   Hct:  41.2 (02/07/2010)   Platelets:  274 (02/07/2010)   MCV  87.3 (02/07/2010)   MCHC  34.7 (12/20/2009)   RDW  14.4 (02/07/2010)   PMN:  50.4 (02/07/2010)   Lymphs:  40.8 (12/20/2009)   Monos:  8.3 (02/07/2010)   Eosinophils:  2.7 (02/07/2010)   Basophil:  0.3 (02/07/2010)  Complete Metabolic Panel   Glucose:  181 (02/02/2010)   Sodium:  144 (12/20/2009)   Potassium:  3.9 (12/20/2009)   Chloride:  105 (12/20/2009)   CO2:  33 (12/20/2009)   BUN:  11 (12/20/2009)   Creatinine:  0.5 (12/20/2009)   Albumin:  4.5 (02/02/2010)   Total Protein:  7.0 (06/20/2009)   Calcium:  10.0 (12/20/2009)   Total Bili:  0.6 (02/02/2010)  Alk Phos:  65 (02/02/2010)   SGPT (ALT):  42 (02/02/2010)   SGOT (AST):  25 (02/02/2010)   Medications Prior to Update: 1)  Synthroid 75 Mcg Tabs (Levothyroxine Sodium) .Marland Kitchen.. 1 Once Daily 2)  Paxil Cr 25 Mg Xr24h-Tab (Paroxetine Hcl) .Marland Kitchen.. 1 Two Times A Day 3)  Singulair 10 Mg Tabs (Montelukast Sodium) .Marland Kitchen.. 1 Once Daily 4)  Metformin Hcl 500 Mg Tabs (Metformin Hcl) .Marland Kitchen.. 1 Two Times A Day 5)  Hydrochlorothiazide 25 Mg Tabs (Hydrochlorothiazide) .Marland Kitchen.. 1 Once Daily 6)  Xanax 0.25 Mg Tabs (Alprazolam) .Marland Kitchen.. 1 Once Daily As Needed 7)  Ambien 10 Mg Tabs (Zolpidem Tartrate) .Marland Kitchen.. 1 At Bedtime As Needed 8)  Nascobal 500 Mcg/0.72m Soln (Cyanocobalamin) .... One Puff in A Nostril Weekly 9)  Multivitamins  Tabs (Multiple Vitamin) ..Marland Kitchen. 1 Once Daily 10)  Tylenol 325 Mg Tabs (Acetaminophen) .... As Directed Per Bottle As Needed 11)  Factive 320 Mg  Tabs (Gemifloxacin Mesylate) .... One Tablet By Mouth Daily 12)  Tramadol Hcl 50 Mg  Tabs (Tramadol Hcl) .... One To Two By Mouth  Every 4-6 Hours  Current Medications (verified): 1)  Synthroid 75 Mcg Tabs (Levothyroxine Sodium) ..Marland Kitchen. 1 Once Daily 2)  Paxil Cr 25 Mg Xr24h-Tab (Paroxetine Hcl) ..Marland Kitchen. 1 Two Times A Day 3)  Singulair 10 Mg Tabs (Montelukast Sodium) ..Marland Kitchen. 1 Once Daily 4)  Metformin Hcl 500 Mg Tabs (Metformin Hcl) ..Marland Kitchen. 1 Two Times A Day 5)  Hydrochlorothiazide 25 Mg Tabs (Hydrochlorothiazide) ..Marland Kitchen. 1 Once Daily 6)  Xanax 0.25 Mg Tabs (Alprazolam) ..Marland Kitchen. 1 Once Daily As Needed 7)  Ambien 10 Mg Tabs (Zolpidem Tartrate) ..Marland Kitchen. 1 At Bedtime As Needed 8)  Nascobal 500 Mcg/0.175mSoln (Cyanocobalamin) .... One Puff in A Nostril Weekly 9)  Multivitamins  Tabs (Multiple Vitamin) ...Marland Kitchen 1 Once Daily 10)  Tylenol 325 Mg Tabs (Acetaminophen) .... As Directed Per Bottle As Needed 11)  Tramadol Hcl 50 Mg  Tabs (Tramadol Hcl) .... One To Two By Mouth Every 4-6 Hours  Allergies (verified): 1)  ! Pcn 2)  ! Sulfa 3)  ! * Haze BoydenPast History:  Past Medical History: Last updated: 03/29/2010 Morbid Obesity     - Target wt  =  185   for BMI < 30  Diabetes, Type 2 Hypertension Sleep Apnea Hyperlipidemia Pulmonary Fibrosis after Acute Lung Injury...................................WMarland Kitchenrt      - ? 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 Social ETOH Daily Caffeine Use 2 Regular exercise-yes  Risk Factors: Alcohol Use: 0 (04/14/2010) Exercise: yes (06/20/2009)  Risk Factors: Smoking Status: never (  04/14/2010)  Family History: Reviewed history from 06/20/2009 and no changes required. No FH of Colon Cancer: Lymphoma: Mother Family History of Diabetes: Father, Son no goiter or other thyroid probs type 2 dm: father type 1 dm:  son  Social History: Reviewed history from 06/20/2009 and no changes required. 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 Social ETOH Daily Caffeine Use 2 Regular exercise-yes  Review of Systems       The patient complains of hoarseness and muscle weakness.  The patient denies anorexia, fever, weight loss, weight gain, chest pain, syncope, dyspnea on exertion, peripheral edema, prolonged cough, headaches, hemoptysis, abdominal pain, hematuria, suspicious skin lesions, difficulty walking, depression, unusual weight change, abnormal bleeding, enlarged lymph nodes, and angioedema.   Resp:  Complains of shortness of breath; denies chest discomfort, chest pain with inspiration, cough, coughing up blood, excessive snoring, hypersomnolence, morning headaches, pleuritic, sputum productive, and wheezing. Derm:  Denies changes in color of skin, changes in nail beds, dryness, excessive perspiration, flushing, hair loss, insect bite(s), itching, lesion(s), poor wound healing, and rash. Psych:  Complains of anxiety and irritability; denies depression, easily angered, easily tearful, mental problems, panic attacks, sense of great danger, suicidal thoughts/plans, thoughts of violence, and unusual visions or sounds. Endo:  Denies cold intolerance, excessive hunger, excessive thirst,  excessive urination, heat intolerance, polyuria, and weight change.  Physical Exam  General:  alert, well-developed, well-nourished, well-hydrated, appropriate dress, healthy-appearing, cooperative to examination, good hygiene, and overweight-appearing.   Head:  normocephalic, atraumatic, no abnormalities observed, and no abnormalities palpated.   Eyes:  vision grossly intact, pupils equal, and no injection.   Ears:  R ear normal and L ear normal.   Nose:  External nasal examination shows no deformity or inflammation. Nasal mucosa are pink and moist without lesions or exudates. Mouth:  Oral mucosa and oropharynx without lesions or exudates.  Teeth in good repair. Neck:  No deformities, masses, or tenderness noted. Lungs:  Normal respiratory effort, chest expands symmetrically. Lungs are clear to auscultation, no crackles or wheezes. Heart:  Normal rate and regular rhythm. S1 and S2 normal without gallop, murmur, click, rub or other extra sounds. Abdomen:  soft, non-tender, normal bowel sounds, no distention, no masses, no guarding, no rigidity, no rebound tenderness, no abdominal hernia, no inguinal hernia, no hepatomegaly, no splenomegaly, and abdominal scar(s).  No CVAT. Msk:  No deformity or scoliosis noted of thoracic or lumbar spine.   Pulses:  R and L carotid,radial,femoral,dorsalis pedis and posterior tibial pulses are full and equal bilaterally Extremities:  No clubbing, cyanosis, edema, or deformity noted with normal full range of motion of all joints.   Neurologic:  No cranial nerve deficits noted. Station and gait are normal. Plantar reflexes are down-going bilaterally. DTRs are symmetrical throughout. Sensory, motor and coordinative functions appear intact. Skin:  Intact without suspicious lesions or rashes. turgor normal, color normal, no rashes, no suspicious lesions, no ecchymoses, no petechiae, no purpura, no ulcerations, and no edema.   Cervical Nodes:  No lymphadenopathy  noted Axillary Nodes:  No palpable lymphadenopathy Psych:  Cognition and judgment appear intact. Alert and cooperative with normal attention span and concentration. No apparent delusions, illusions, hallucinations  Diabetes Management Exam:    Foot Exam (with socks and/or shoes not present):       Sensory-Pinprick/Light touch:          Left medial foot (L-4): normal          Left  dorsal foot (L-5): normal          Left lateral foot (S-1): normal          Right medial foot (L-4): normal          Right dorsal foot (L-5): normal          Right lateral foot (S-1): normal       Sensory-Monofilament:          Left foot: normal          Right foot: normal       Inspection:          Left foot: normal          Right foot: normal       Nails:          Left foot: normal          Right foot: normal   Impression & Recommendations:  Problem # 1:  PNEUMONIA DUE TO OTHER SPECIFIED ORGANISM (ICD-483.8) Assessment Unchanged  The following medications were removed from the medication list:    Factive 320 Mg Tabs (Gemifloxacin mesylate) ..... One tablet by mouth daily  Orders: T-2 View CXR (65681EX)  Problem # 2:  DERMATITIS DUE DRUGS&MEDICINES TAKEN INTERNALLY (ICD-693.0) Assessment: Improved  Problem # 3:  COUGH (ICD-786.2) Assessment: Improved  Orders: T-2 View CXR ([id number])  Problem # 4:  HYPOTHYROIDISM (ICD-244.9) Assessment: Unchanged  Her updated medication list for this problem includes:    Synthroid 75 Mcg Tabs (Levothyroxine sodium) .Marland Kitchen... 1 once daily  Labs Reviewed: TSH: 2.453 (02/02/2010)    HgBA1c: 6.5 (12/20/2009) Chol: 197 (02/02/2010)   HDL: 35.2 (02/02/2010)   LDL: 98.8 (02/02/2010)   TG: 315 (02/02/2010)  Problem # 5:  HYPERTENSION (ICD-401.9) Assessment: Unchanged  Her updated medication list for this problem includes:    Hydrochlorothiazide 25 Mg Tabs (Hydrochlorothiazide) .Marland Kitchen... 1 once daily  BP today: 140/88 Prior BP: 126/86 (03/29/2010)  Prior 10 Yr  Risk Heart Disease: Not enough information (06/20/2009)  Labs Reviewed: K+: 3.9 (12/20/2009) Creat: : 0.5 (12/20/2009)   Chol: 197 (02/02/2010)   HDL: 35.2 (02/02/2010)   LDL: 98.8 (02/02/2010)   TG: 315 (02/02/2010)  Problem # 6:  DIABETES, TYPE 2 (ICD-250.00) Assessment: Unchanged  Her updated medication list for this problem includes:    Metformin Hcl 500 Mg Tabs (Metformin hcl) .Marland Kitchen... 1 two times a day  Labs Reviewed: Creat: 0.5 (12/20/2009)     Last Eye Exam: normal (09/23/2008) Reviewed HgBA1c results: 6.5 (12/20/2009)  6.4 (11/21/2009)  Complete Medication List: 1)  Synthroid 75 Mcg Tabs (Levothyroxine sodium) .Marland Kitchen.. 1 once daily 2)  Paxil Cr 25 Mg Xr24h-tab (Paroxetine hcl) .Marland Kitchen.. 1 two times a day 3)  Singulair 10 Mg Tabs (Montelukast sodium) .Marland Kitchen.. 1 once daily 4)  Metformin Hcl 500 Mg Tabs (Metformin hcl) .Marland Kitchen.. 1 two times a day 5)  Hydrochlorothiazide 25 Mg Tabs (Hydrochlorothiazide) .Marland Kitchen.. 1 once daily 6)  Xanax 0.25 Mg Tabs (Alprazolam) .Marland Kitchen.. 1 once daily as needed 7)  Ambien 10 Mg Tabs (Zolpidem tartrate) .Marland Kitchen.. 1 at bedtime as needed 8)  Nascobal 500 Mcg/0.69m Soln (Cyanocobalamin) .... One puff in a nostril weekly 9)  Multivitamins Tabs (Multiple vitamin) ..Marland Kitchen. 1 once daily 10)  Tylenol 325 Mg Tabs (Acetaminophen) .... As directed per bottle as needed 11)  Tramadol Hcl 50 Mg Tabs (Tramadol hcl) .... One to two by mouth every 4-6 hours  Patient Instructions: 1)  Please schedule a follow-up appointment in 2 weeks. 2)  Get plenty  of rest, drink lots of clear liquids, and use Tylenol or Ibuprofen for fever and comfort. Return in 7-10 days if you're not better:sooner if you're feeling worse.   Orders Added: 1)  T-2 View CXR [[id number]] 2)  Est. Patient Level V [38182]    Laboratory Results   Blood Tests     CBG Random:: 171m/dL

## 2010-04-20 NOTE — Progress Notes (Signed)
Summary: fu cxr needed  ---- Converted from flag ---- ---- 03/29/2010 3:47 PM, Tanda Rockers MD wrote: be sure she has f/u ov by now ------------------------------  Dr Melvyn Novas, Pt has appt sched for 04/28/10.  Is this okay, or should I get him in sooner? Pls advise thanks! Tilden Dome  April 13, 2010 4:43 PM this is fine Tanda Rockers MD  April 14, 2010 12:03 PM

## 2010-04-25 NOTE — Progress Notes (Signed)
Summary: RESULTS  Phone Note Call from Patient Call back at Home Phone [phone]   Summary of Call: Patient is requesting results of xray.  Initial call taken by: Charlsie Quest, [place],  April 17, 2010 12:25 PM  Follow-up for Phone Call        normal Follow-up by: [name] MD,  April 17, 2010 12:41 PM     Appended Document: RESULTS left vm on pt's cell #  Appended Document: RESULTS Correction - left vm on pt's HOME #

## 2010-04-28 ENCOUNTER — Ambulatory Visit: Payer: Managed Care, Other (non HMO) | Admitting: Internal Medicine

## 2010-06-07 ENCOUNTER — Ambulatory Visit (INDEPENDENT_AMBULATORY_CARE_PROVIDER_SITE_OTHER): Payer: Managed Care, Other (non HMO) | Admitting: Internal Medicine

## 2010-06-07 ENCOUNTER — Encounter: Payer: Self-pay | Admitting: Internal Medicine

## 2010-06-07 ENCOUNTER — Other Ambulatory Visit (INDEPENDENT_AMBULATORY_CARE_PROVIDER_SITE_OTHER): Payer: Managed Care, Other (non HMO)

## 2010-06-07 ENCOUNTER — Other Ambulatory Visit (INDEPENDENT_AMBULATORY_CARE_PROVIDER_SITE_OTHER): Payer: Managed Care, Other (non HMO) | Admitting: Internal Medicine

## 2010-06-07 DIAGNOSIS — E785 Hyperlipidemia, unspecified: Secondary | ICD-10-CM

## 2010-06-07 DIAGNOSIS — E119 Type 2 diabetes mellitus without complications: Secondary | ICD-10-CM

## 2010-06-07 DIAGNOSIS — E039 Hypothyroidism, unspecified: Secondary | ICD-10-CM

## 2010-06-07 DIAGNOSIS — N390 Urinary tract infection, site not specified: Secondary | ICD-10-CM

## 2010-06-07 DIAGNOSIS — I10 Essential (primary) hypertension: Secondary | ICD-10-CM

## 2010-06-07 DIAGNOSIS — E876 Hypokalemia: Secondary | ICD-10-CM

## 2010-06-07 DIAGNOSIS — E538 Deficiency of other specified B group vitamins: Secondary | ICD-10-CM

## 2010-06-07 LAB — URINALYSIS, ROUTINE W REFLEX MICROSCOPIC
Nitrite: NEGATIVE
Specific Gravity, Urine: <=1.005 (ref 1.000–1.030)
Urobilinogen, UA: 0.2 (ref 0.0–1.0)
pH: 6.5 (ref 5.0–8.0)

## 2010-06-07 LAB — CBC WITH DIFFERENTIAL/PLATELET
Basophils Relative: 0.3 % (ref 0.0–3.0)
Eosinophils Absolute: 0.2 10*3/uL (ref 0.0–0.7)
Eosinophils Relative: 2.4 % (ref 0.0–5.0)
HCT: 42.3 % (ref 36.0–46.0)
Lymphs Abs: 3 10*3/uL (ref 0.7–4.0)
MCHC: 34.7 g/dL (ref 30.0–36.0)
MCV: 90.3 fl (ref 78.0–100.0)
Monocytes Absolute: 0.5 10*3/uL (ref 0.1–1.0)
Neutrophils Relative %: 55.3 % (ref 43.0–77.0)
RBC: 4.68 Mil/uL (ref 3.87–5.11)

## 2010-06-07 LAB — COMPREHENSIVE METABOLIC PANEL
ALT: 36 U/L — ABNORMAL HIGH (ref 0–35)
Alkaline Phosphatase: 70 U/L (ref 39–117)
CO2: 29 mEq/L (ref 19–32)
Creatinine, Ser: 0.5 mg/dL (ref 0.4–1.2)
GFR: 134.15 mL/min (ref >60.00)
Sodium: 141 mEq/L (ref 135–145)
Total Bilirubin: 0.7 mg/dL (ref 0.3–1.2)
Total Protein: 6.6 g/dL (ref 6.0–8.3)

## 2010-06-07 LAB — LIPID PANEL
Total CHOL/HDL Ratio: 5
Triglycerides: 306 mg/dL — ABNORMAL HIGH (ref 0.0–149.0)

## 2010-06-07 LAB — LDL CHOLESTEROL, DIRECT: Direct LDL: 114.7 mg/dL

## 2010-06-07 LAB — HEMOGLOBIN A1C: Hgb A1c MFr Bld: 7.1 % — ABNORMAL HIGH (ref 4.6–6.5)

## 2010-06-07 MED ORDER — NIACIN-SIMVASTATIN ER 500-40 MG PO TB24: 1.0000 | ORAL_TABLET | Freq: Every day | ORAL | Status: DC

## 2010-06-07 MED ORDER — LINAGLIPTIN-METFORMIN HCL 2.5-1000 MG PO TABS: 1.0000 | ORAL_TABLET | Freq: Two times a day (BID) | ORAL | Status: DC

## 2010-06-07 MED ORDER — OLMESARTAN MEDOXOMIL 40 MG PO TABS: 40.0000 mg | ORAL_TABLET | Freq: Every day | ORAL | Status: DC

## 2010-06-07 NOTE — Assessment & Plan Note (Signed)
Check K+ level today

## 2010-06-07 NOTE — Assessment & Plan Note (Signed)
Start [employer] and recheck FLP and CMP today

## 2010-06-07 NOTE — Progress Notes (Signed)
Subjective:    Patient ID: Kirsten [name], female    DOB: 12-03-1955, 55 y.o.   MRN: [id number]  Diabetes She presents for her follow-up diabetic visit. She has type 2 diabetes mellitus. No MedicAlert identification noted. Her disease course has been worsening. There are no hypoglycemic associated symptoms. Pertinent negatives for hypoglycemia include no confusion, dizziness, headaches, nervousness/anxiousness, pallor, seizures, speech difficulty or tremors. Associated symptoms include polyphagia and polyuria. Pertinent negatives for diabetes include no blurred vision, no chest pain, no fatigue, no foot paresthesias, no foot ulcerations, no polydipsia, no visual change, no weakness and no weight loss. There are no hypoglycemic complications. Symptoms are worsening. There are no diabetic complications. Current diabetic treatment includes oral agent (monotherapy). She is compliant with treatment all of the time. Her weight is increasing steadily. She is following a generally healthy diet. Meal planning includes avoidance of concentrated sweets. She has not had a previous visit with a dietician. She participates in exercise intermittently. Her home blood glucose trend is increasing rapidly. Her breakfast blood glucose range is generally 140-180 mg/dl. Her lunch blood glucose range is generally 140-180 mg/dl. Her dinner blood glucose range is generally 140-180 mg/dl. Her highest blood glucose is 140-180 mg/dl. Her overall blood glucose range is 140-180 mg/dl. An ACE inhibitor/angiotensin II receptor blocker is not being taken. She does not see a podiatrist.Eye exam is not current.      Review of Systems  Constitutional: Negative for fever, chills, weight loss, diaphoresis, activity change, appetite change, fatigue and unexpected weight change.  HENT: Negative for facial swelling, neck pain and neck stiffness.   Eyes: Negative for blurred vision.  Respiratory: Negative for apnea, cough, choking, chest  tightness, shortness of breath, wheezing and stridor.   Cardiovascular: Negative for chest pain, palpitations and leg swelling.  Gastrointestinal: Negative for nausea, vomiting, abdominal pain, diarrhea, constipation, blood in stool, abdominal distention and anal bleeding.  Genitourinary: Positive for polyuria. Negative for dysuria, urgency, frequency, hematuria, flank pain, decreased urine volume and difficulty urinating.  Musculoskeletal: Negative for myalgias, back pain, joint swelling, arthralgias and gait problem.  Skin: Negative for color change, pallor and rash.  Neurological: Negative for dizziness, tremors, seizures, syncope, facial asymmetry, speech difficulty, weakness, light-headedness, numbness and headaches.  Hematological: Positive for polyphagia. Negative for polydipsia and adenopathy. Does not bruise/bleed easily.  Psychiatric/Behavioral: Negative for behavioral problems, confusion, self-injury, dysphoric mood, decreased concentration and agitation. The patient is not nervous/anxious.        Objective:   Physical Exam  Vitals reviewed. Constitutional: She is oriented to person, place, and time. She appears well-developed and well-nourished. No distress.  HENT:  Head: Normocephalic and atraumatic.  Right Ear: External ear normal.  Left Ear: External ear normal.  Nose: Nose normal.  Mouth/Throat: Oropharynx is clear and moist. No oropharyngeal exudate.  Eyes: Conjunctivae and EOM are normal. Pupils are equal, round, and reactive to light. Right eye exhibits no discharge. Left eye exhibits no discharge. No scleral icterus.  Neck: Normal range of motion. Neck supple. No JVD present. No tracheal deviation present. No thyromegaly present.  Cardiovascular: Normal rate, regular rhythm and intact distal pulses.  Exam reveals no gallop and no friction rub.   No murmur heard. Pulmonary/Chest: Effort normal and breath sounds normal. No stridor. No respiratory distress. She has no  wheezes. She has no rales. She exhibits no tenderness.  Abdominal: Soft. Bowel sounds are normal. She exhibits no distension and no mass. There is no tenderness. There is no rebound and no  guarding.  Musculoskeletal: Normal range of motion. She exhibits no edema and no tenderness.  Lymphadenopathy:    She has no cervical adenopathy.  Neurological: She is alert and oriented to person, place, and time. She has normal reflexes. She displays normal reflexes. No cranial nerve deficit. She exhibits normal muscle tone. Coordination normal.  Skin: Skin is warm and dry. No rash noted. She is not diaphoretic. No erythema. No pallor.  Psychiatric: She has a normal mood and affect. Her behavior is normal. Judgment and thought content normal.        Lab Results  Component Value Date   WBC 7.0 02/07/2010   HGB 14.0 02/07/2010   HCT 41.2 02/07/2010   PLT 274 02/07/2010   CHOL 197 02/02/2010   TRIG 209.0* 06/20/2009   HDL 35.2 02/02/2010   LDLDIRECT 139.2 06/20/2009   ALT 42 02/02/2010   AST 25 02/02/2010   NA 144 12/20/2009   K 3.9 12/20/2009   CL 105 12/20/2009   CREATININE 0.5 12/20/2009   BUN 11 12/20/2009   CO2 33* 12/20/2009   TSH 2.453 02/02/2010   HGBA1C 6.5 12/20/2009   MICROALBUR 2.6* 06/20/2009    Assessment & Plan:

## 2010-06-07 NOTE — Assessment & Plan Note (Signed)
Check her CBC today

## 2010-06-07 NOTE — Assessment & Plan Note (Signed)
Start an ARB since she has microalbuminuria and DM II

## 2010-06-07 NOTE — Assessment & Plan Note (Signed)
Increase meds since she has symptomatic hyperglycemia, check her A1C and renal function today.

## 2010-06-07 NOTE — Assessment & Plan Note (Signed)
Check her TSH level today

## 2010-06-07 NOTE — Assessment & Plan Note (Signed)
She has no s/s, I will recheck her UA and culture today

## 2010-06-07 NOTE — Patient Instructions (Signed)
Diabetes, Type 2 Diabetes is a lasting (chronic) disease. In type 2 diabetes, the pancreas does not make enough insulin (a hormone), and the body does not respond normally to the insulin that is made. This type of diabetes was also previously called adult onset diabetes. About 90% of all those who have diabetes have type 2. It usually occurs after the age of 40 but can occur at any age. CAUSES Unlike type 1 diabetes, which happens because insulin is no longer being made, type 2 diabetes happens because the body is making less insulin and has trouble using the insulin properly. SYMPTOMS  Drinking more than usual.   Urinating more than usual.   Blurred vision.   Dry, itchy skin.   Frequent infection like yeast infections in women.   More tired than usual (fatigue).  TREATMENT  Healthy eating.   Exercise.   Medication, if needed.   Monitoring blood glucose (sugar).   Seeing your caregiver regularly.  HOME CARE INSTRUCTIONS  Check your blood glucose (sugar) at least once daily. More frequent monitoring may be necessary, depending on your medications and on how well your diabetes is controlled. Your caregiver will advise you.   Take your medicine as directed by your caregiver.   Do not smoke.   Make wise food choices. Ask your caregiver for information. Weight loss can improve your diabetes.   Learn about low blood glucose (hypoglycemia) and how to treat it.   Get your eyes checked regularly.   Have a yearly physical exam. Have your blood pressure checked. Get your blood and urine tested.   Wear a pendant or bracelet saying that you have diabetes.   Check your feet every night for sores. Let your caregiver know if you have sores that are not healing.  SEEK MEDICAL CARE IF:  You are having problems keeping your blood glucose at target range.   You feel you might be having problems with your medicines.   You have symptoms of an illness that is not improving after 24  hours.   You have a sore or wound that is not healing.   You notice a change in vision or a new problem with your vision.   You develop a fever of more than 100.5.  Document Released: 01/29/2005 Document Re-Released: 02/20/2009 Exit[employer] Patient Information 2011 [employer]. 

## 2010-06-09 ENCOUNTER — Encounter: Payer: Self-pay | Admitting: Internal Medicine

## 2010-06-09 ENCOUNTER — Telehealth: Payer: Self-pay | Admitting: *Deleted

## 2010-06-09 LAB — CULTURE, URINE COMPREHENSIVE: Colony Count: >=100000

## 2010-06-09 MED ORDER — NITROFURANTOIN MONOHYD MACRO 100 MG PO CAPS: 100.0000 mg | ORAL_CAPSULE | Freq: Two times a day (BID) | ORAL | Status: AC

## 2010-06-09 NOTE — Telephone Encounter (Signed)
Returned call to patient//lmovm to check with pharmacy for rx. Also advised her to call back next week to schedule a follow up

## 2010-06-09 NOTE — Telephone Encounter (Signed)
Pt left vm - states she is ret call from MD. (pharm is in system)

## 2010-06-09 NOTE — Progress Notes (Signed)
Addended by: [name] on: 06/09/2010 07:57 AM   Modules accepted: Orders

## 2010-06-09 NOTE — Telephone Encounter (Signed)
Antibiotic Rx was sent to her pharmacy

## 2010-07-05 ENCOUNTER — Ambulatory Visit (INDEPENDENT_AMBULATORY_CARE_PROVIDER_SITE_OTHER)
Admission: RE | Admit: 2010-07-05 | Discharge: 2010-07-05 | Disposition: A | Discharge status: Home / Self Care | Payer: Managed Care, Other (non HMO) | Source: Ambulatory Visit | Attending: Adult Health | Admitting: Adult Health

## 2010-07-05 ENCOUNTER — Other Ambulatory Visit (INDEPENDENT_AMBULATORY_CARE_PROVIDER_SITE_OTHER): Payer: Managed Care, Other (non HMO)

## 2010-07-05 ENCOUNTER — Ambulatory Visit: Payer: Managed Care, Other (non HMO) | Admitting: Internal Medicine

## 2010-07-05 ENCOUNTER — Ambulatory Visit (INDEPENDENT_AMBULATORY_CARE_PROVIDER_SITE_OTHER): Payer: Managed Care, Other (non HMO) | Admitting: Adult Health

## 2010-07-05 ENCOUNTER — Encounter: Payer: Self-pay | Admitting: Adult Health

## 2010-07-05 ENCOUNTER — Encounter: Payer: Self-pay | Admitting: *Deleted

## 2010-07-05 ENCOUNTER — Other Ambulatory Visit: Payer: Self-pay | Admitting: Internal Medicine

## 2010-07-05 VITALS — BP 124/76 | HR 89 | Temp 97.0°F | Ht 67.0 in | Wt 221.0 lb

## 2010-07-05 DIAGNOSIS — R0609 Other forms of dyspnea: Secondary | ICD-10-CM

## 2010-07-05 DIAGNOSIS — R0600 Dyspnea, unspecified: Secondary | ICD-10-CM

## 2010-07-05 DIAGNOSIS — N390 Urinary tract infection, site not specified: Secondary | ICD-10-CM

## 2010-07-05 DIAGNOSIS — J8289 Other pulmonary eosinophilia, not elsewhere classified: Secondary | ICD-10-CM

## 2010-07-05 LAB — URINALYSIS, ROUTINE W REFLEX MICROSCOPIC
Ketones, ur: NEGATIVE
Specific Gravity, Urine: 1.015 (ref 1.000–1.030)
Urine Glucose: NEGATIVE
pH: 6.5 (ref 5.0–8.0)

## 2010-07-05 MED ORDER — BUDESONIDE-FORMOTEROL FUMARATE 80-4.5 MCG/ACT IN AERO: 2.0000 | INHALATION_SPRAY | Freq: Two times a day (BID) | RESPIRATORY_TRACT | Status: DC

## 2010-07-05 NOTE — Progress Notes (Signed)
Subjective:    Patient ID: Kirsten [name], female    DOB: [birth year], 55 y.o.   MRN: [id number]  HPI 29 yowf minimal remote smoking socially works as Research scientist (physical sciences) for a [employer] and not 100% since 2007 after episode of pna with intermittently unexplained sob @ rest as bad as with exercise for which she had w/u by Dr Chancy Milroy no dx but seemed better after singulair 100% but while on it then stopped it and gradually worse again but put up with it chronically.   Mar 02 2009 flew to [facility] then the next day scratchy throat and cough ? allergy to dog few back home next day felt tired then after sleeping woke up 1 am severe sob, wheezing > [place] ER with rx neb better enough to go home but next day readmitted x 11 days rx with tamiflu > at discharge still weak, still sob and wheezing heart racing but fever gone, some cp right chest improving gradually.   April 08, 2009 cc still intermittent cp beneath right breast but much better than it was, short of breath speaking and quite hoarse some short of breath grocery stop. Imp was ? vcd/gerd rec Acid reflux is a leading suspect here (for your hoarseness and throat irritation and needs to be eliminated completely before considering additional studies or treatment options. To suppress this maximally, take prilosec 30 min before first meal and pepcid 20 mg (otc) at bedtime plus diet measures as listed.  Stop symbicort   April 22, 2009 ov breathing is some improved. not needing any inhaler . She states that she still has occ SOB and chest tightness that comes "out of the blue" resolves without treatment in 10 minutes assoc with migratory upper abd fullness, never occurs supine. no doe. rec citrucel one tsp two times a day   May 17, 2009 Acute visit. Pt c/o prod cough with green sputum since 4/1. She also c/o wheeznig at night when lies down. has not used saba however. rec doxy/ prednisone and sinus ct if not better  see page 2   September 22, 2009 Followup on cxr and  pft's. Pt states that overall her breathing is the same- gets SOB some with talking and notices some wheezing when she lies down. No new complaints today. uses cpap at night and this eliminates the wheezes. better after rehab. rec wt loss.   September 29, 2009 ov cc increased sob with exertion and at rest...RUQ and back pain...fatigue...wheezing...hoarseness and non-shaking chills. All symptoms started 2 days ago, better lying flat, cp same as prev attributed to gerd vs IBS/ adhesions, not using citrucel as prev rec. Levaquin 750 one daily x 5 days  Tramadol 50 mg 1-2 every 4 hours as needed  Citrucel 1 tsp twice daily in large glass or water > improved to her satisfaction   March 29, 2010 ov cc sob. until 3 days prior to ov was doing" the best in years" on just singulair. then abupt onset achy, chills, sob, wheezing, cough dry, similar pain as in past in ruq and flank rx with tylenol no better.   07/05/10 Acute OV Presents for a work in visit. Complains of 2 weeks of worsening DOE and wheezing. No cough , discolored mucus or sinus congestion , gerd. Feels tired and worn out. NO sleeping well. NO dyspnea with sleeping. Uses CPAP at night- no O2.  She is very frustrated , feels that since her critical illness with H1N1 she has never been the same. She has  gained >30lbs, has no energy, wears out easily., breath gives out with walking esp with incline. She does have occasional wheezing but mainly only at incline. No exertional chest pain, palpitations or syncope. She did complete pulm. Rehab in past but it was very stressful due to having to juggle work schedule. She does not exercise at all. No discolored mucus or cough.  She is a never smoker.  Recent labs reveiwed w/ nml tsh.   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 CAP 02/2009  - CT chest  [place] 03/28/09 c/w PF with baseline cxr nl 03/09/09  - f/u PFT's September 20, 2009 FEV1 1.55 (57%) ratio 83 with DLC0 58% and no desat x 3 laps  - Completed rehab June 2011  Born with [employer], rotated organs abdominal repair until age 89  - sp Small Bowel Obstruction, resolved with NG/ bowel rest around 2009  Anxiety Disorder  GERD  Irritable Bowel Syndrome  Obesity   Review of Systems Constitutional:   No  weight loss, night sweats,  Fevers, chills, + fatigue, or  lassitude.  HEENT:   No headaches,  Difficulty swallowing,  Tooth/dental problems, or  Sore throat,                No sneezing, itching, ear ache, nasal congestion, post nasal drip,   CV:  No chest pain,  Orthopnea, PND, swelling in lower extremities, anasarca, dizziness, palpitations, syncope.   GI  No heartburn, indigestion, abdominal pain, nausea, vomiting, diarrhea, change in bowel habits, loss of appetite, bloody stools.   Resp:  No excess mucus, no productive cough,  No non-productive cough,  No coughing up of blood.  No change in color of mucus.  No chest wall deformity  Skin: no rash or lesions.  GU: no dysuria, change in color of urine, no urgency or frequency.  No flank pain, no hematuria   MS:  No joint pain or swelling.  No decreased range of motion.  No back pain.  Psych:  No change in mood or affect. No depression or anxiety.  No memory loss.         Objective:   Physical Exam GEN: A/Ox3; pleasant , NAD, obese   HEENT:  [place]/AT,  EACs-clear, TMs-wnl, NOSE-clear, THROAT-clear, no lesions, no postnasal drip or exudate noted.   NECK:  Supple w/ fair ROM; no JVD; normal carotid impulses w/o bruits; no thyromegaly or nodules palpated; no lymphadenopathy.  RESP  Clear  P & A; w/o, wheezes/ rales/ or rhonchi.no accessory muscle use, no dullness to percussion  CARD:  RRR, no m/r/g  , no peripheral edema, pulses intact, no cyanosis or clubbing.  GI:   Soft & nt; nml bowel sounds; no  organomegaly or masses detected.  Musco: Warm bil, no deformities or joint swelling noted.   Neuro: alert, no focal deficits noted.    Skin: Warm, no lesions or rashes  3 laps in office w/ no desaturations 97-92%  HR 89-128        Assessment & Plan:

## 2010-07-05 NOTE — Patient Instructions (Addendum)
Begin Symbicort 80/4.7mg 2 puffs Twice daily  -brush/rinse and gargle after use.  We are setting you up for an overnight oximetry I will call with xray results.  follow up in 4 weeks and As needed   Please contact office for sooner follow up if symptoms do not improve or worsen or seek emergency care

## 2010-07-05 NOTE — Assessment & Plan Note (Signed)
Labs and xray

## 2010-07-06 ENCOUNTER — Other Ambulatory Visit: Payer: Self-pay | Admitting: Internal Medicine

## 2010-07-06 DIAGNOSIS — R0600 Dyspnea, unspecified: Secondary | ICD-10-CM | POA: Insufficient documentation

## 2010-07-06 NOTE — Assessment & Plan Note (Addendum)
Suspect is multifactoral in nature Will check xray today, along w/ labs w/ bnp, esr.  Begin Symbicort 80/4.38mg 2 puffs Twice daily  -brush/rinse and gargle after use.  We are setting you up for an overnight oximetry I will call with xray results.  follow up in 4 weeks and As needed   Please contact office for sooner follow up if symptoms do not improve or worsen or seek emergency care

## 2010-07-07 ENCOUNTER — Telehealth: Payer: Self-pay

## 2010-07-07 NOTE — Telephone Encounter (Signed)
Spoke with patient and advised per MD   Notes Recorded by Crissie Sickles, CMA on 07/06/2010 at 3:57 PM Left message on machine for pt to return my call ------ Notes Recorded by Donavan Foil, MD on 07/06/2010 at 12:51 PM please call patient: Only a few white-blood cells. Let's see what the culture shows.

## 2010-07-07 NOTE — Progress Notes (Signed)
Left message on machine for pt to return my call  

## 2010-07-08 LAB — CULTURE, URINE COMPREHENSIVE: Colony Count: 40000

## 2010-07-11 ENCOUNTER — Telehealth: Payer: Self-pay | Admitting: *Deleted

## 2010-07-11 DIAGNOSIS — N390 Urinary tract infection, site not specified: Secondary | ICD-10-CM | POA: Insufficient documentation

## 2010-07-11 MED ORDER — NITROFURANTOIN MONOHYD MACRO 100 MG PO CAPS: 100.0000 mg | ORAL_CAPSULE | Freq: Two times a day (BID) | ORAL | Status: AC

## 2010-07-11 NOTE — Telephone Encounter (Signed)
I recommend the ER at [facility], they can give her a dose of IV anitbiotics, call the Urologist, put in an IV then [facility] can do any further IV antibiotic injections

## 2010-07-11 NOTE — Progress Notes (Signed)
Addended by: [name] on: 07/11/2010 07:36 AM   Modules accepted: Orders

## 2010-07-11 NOTE — Telephone Encounter (Signed)
Pt left vm - she says she spoke w/MD this am. She prefers to go to the hospital for IV abx and see urologist while inpatient. Please advise.

## 2010-07-11 NOTE — Telephone Encounter (Signed)
Patient informed. 

## 2010-07-11 NOTE — Progress Notes (Signed)
Addended by: [name] on: 07/11/2010 07:32 AM   Modules accepted: Orders

## 2010-07-12 ENCOUNTER — Inpatient Hospital Stay (HOSPITAL_COMMUNITY)
Admission: EM | Admit: 2010-07-12 | Discharge: 2010-07-13 | DRG: 690 | Disposition: A | Discharge status: Home / Self Care | Payer: Managed Care, Other (non HMO) | Source: Ambulatory Visit | Attending: Internal Medicine | Admitting: Internal Medicine

## 2010-07-12 DIAGNOSIS — B961 Klebsiella pneumoniae [K. pneumoniae] as the cause of diseases classified elsewhere: Secondary | ICD-10-CM | POA: Diagnosis present

## 2010-07-12 DIAGNOSIS — Z87440 Personal history of urinary (tract) infections: Secondary | ICD-10-CM

## 2010-07-12 DIAGNOSIS — E039 Hypothyroidism, unspecified: Secondary | ICD-10-CM | POA: Diagnosis present

## 2010-07-12 DIAGNOSIS — I10 Essential (primary) hypertension: Secondary | ICD-10-CM | POA: Diagnosis present

## 2010-07-12 DIAGNOSIS — J4489 Other specified chronic obstructive pulmonary disease: Secondary | ICD-10-CM | POA: Diagnosis present

## 2010-07-12 DIAGNOSIS — N390 Urinary tract infection, site not specified: Principal | ICD-10-CM | POA: Diagnosis present

## 2010-07-12 DIAGNOSIS — A498 Other bacterial infections of unspecified site: Secondary | ICD-10-CM | POA: Diagnosis present

## 2010-07-12 DIAGNOSIS — J449 Chronic obstructive pulmonary disease, unspecified: Secondary | ICD-10-CM | POA: Diagnosis present

## 2010-07-12 DIAGNOSIS — F341 Dysthymic disorder: Secondary | ICD-10-CM | POA: Diagnosis present

## 2010-07-12 DIAGNOSIS — G4730 Sleep apnea, unspecified: Secondary | ICD-10-CM | POA: Diagnosis present

## 2010-07-12 DIAGNOSIS — E119 Type 2 diabetes mellitus without complications: Secondary | ICD-10-CM | POA: Diagnosis present

## 2010-07-12 DIAGNOSIS — E876 Hypokalemia: Secondary | ICD-10-CM | POA: Diagnosis present

## 2010-07-12 LAB — BASIC METABOLIC PANEL
CO2: 30 mEq/L (ref 19–32)
Calcium: 10.8 mg/dL — ABNORMAL HIGH (ref 8.4–10.5)
Chloride: 96 mEq/L (ref 96–112)
Glucose, Bld: 160 mg/dL — ABNORMAL HIGH (ref 70–99)
Sodium: 138 mEq/L (ref 135–145)

## 2010-07-12 LAB — GLUCOSE, CAPILLARY

## 2010-07-12 LAB — DIFFERENTIAL
Basophils Absolute: 0 10*3/uL (ref 0.0–0.1)
Basophils Relative: 0 % (ref 0–1)
Eosinophils Absolute: 0.2 10*3/uL (ref 0.0–0.7)
Eosinophils Relative: 3 % (ref 0–5)
Monocytes Absolute: 0.6 10*3/uL (ref 0.1–1.0)

## 2010-07-12 LAB — URINALYSIS, ROUTINE W REFLEX MICROSCOPIC
Protein, ur: NEGATIVE mg/dL
Urobilinogen, UA: 0.2 mg/dL (ref 0.0–1.0)

## 2010-07-12 LAB — URINE MICROSCOPIC-ADD ON

## 2010-07-12 LAB — CBC
MCHC: 34.7 g/dL (ref 30.0–36.0)
RDW: 13.2 % (ref 11.5–15.5)

## 2010-07-13 LAB — URINE CULTURE
Colony Count: 40000
Culture  Setup Time: 201205301040

## 2010-07-13 LAB — GLUCOSE, CAPILLARY: Glucose-Capillary: 157 mg/dL — ABNORMAL HIGH (ref 70–99)

## 2010-07-13 LAB — BASIC METABOLIC PANEL
CO2: 32 mEq/L (ref 19–32)
Calcium: 9.5 mg/dL (ref 8.4–10.5)
Sodium: 140 mEq/L (ref 135–145)

## 2010-07-13 NOTE — H&P (Addendum)
Kirsten [name], Kirsten [name]              ACCOUNT NO.:  [id number]  MEDICAL RECORD NO.:  [id number]           PATIENT TYPE:  E  LOCATION:  WLED                         FACILITY:  [facility]  PHYSICIAN:  Lottie Dawson, MD       DATE OF BIRTH:  1956-02-02  DATE OF ADMISSION:  07/12/2010 DATE OF DISCHARGE:                             HISTORY & PHYSICAL   The patient is being admitted to [facility] #5.  PRIMARY CARE PROVIDER:  [name], MD  PULMONOLOGIST:  Christena Deem. Melvyn Novas, MD, FCCP  HISTORY OF PRESENT ILLNESS:  Ms. Kirsten [name] is a very pleasant 55 year old female with a history of COPD, diabetes type 2, hypertension, anxiety, all of which were diagnosed within the last 18 months as well as recurrent UTI who presents to the [place] ED from a primary care provider's office with a chief complaint of urinary tract infection. Information is obtained from the patient.  She reports that she has been on Macrobid earlier this month for urinary tract infection.  She reports that she completed her course of antibiotic but continued to have frequency of urination, foul odor to her urine, as well as intermittent nausea and headaches.  She indicates that she left a urine specimen with her primary care provider last week and was informed yesterday that her UTI was "resistant."  Primary care provider recommended she come to the emergency room for IV antibiotics.  The patient reports not feeling well, generally speaking, since January of 2011 when she was hospitalized for 14 days for bilateral pneumonia.  She indicates that since that time she has experienced exertional dyspnea.  She has developed diabetes from the steroids that she has had to take.  She has developed hypertension, anxiety as well as recurrent UTIs.  She also indicates that lately she has felt very fatigued and anxious.  She indicates that her urinary tract infection symptoms came on gradually, have persisted, are  characterized as moderate.  The patient denies any chest pain, palpitations, dizziness, slurred speech, numbness, tingling of extremities.  She denies any change in her eating habits.  She denies any pain.  We are asked to admit for further evaluation and treatment.  PAST MEDICAL HISTORY: 1. COPD. 2. Diabetes type 2. 3. Hypertension. 4. Hypothyroidism. 5. Anxiety.  PAST SURGICAL HISTORY: 1. Cesarean section x3. 2. Cholecystectomy. 3. Tubal ligation.  FAMILY MEDICAL HISTORY:  Her mother deceased at age 65 from [place]. Her father is alive 6 years old, he has diabetes and hypertension.  She has 3 siblings who are all alive and in good health.  ALLERGIES: 1. PENICILLIN 2. SULFA. 3. CIPRO.  She indicates she gets rash when she takes any of these     medications.  SOCIAL HISTORY:  She is married.  She lives with her husband and her children.  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  She denies tobacco use.  Denies EtOH use.  Denies illicit drug use.  MEDICATIONS: 1. Nascobal 500/0.1 mL nasal spray 1 spray every 7 days.  She takes     this on Sundays. 2. Vitamin B complex  over-the-counter 1 tablet daily. 3. Ibuprofen 200 mg p.o. 3 tablets every 8 hours as needed for     headache. 4. Tylenol Extra Strength 500 mg p.o. 2 tablets every 6 hours as     needed for headache. 5. Xanax 0.25 mg daily as needed for anxiety. 6. Synthroid 75 mcg p.o. daily at bedtime. 7. Singulair 10 mg 1 tablet daily at bedtime. 8. Paxil CR 25 mg p.o. daily at bedtime. 9. Multivitamin with iron over-the-counter 1 tablet daily 10.Metformin 500 mg p.o. b.i.d. 11.Hydrochlorothiazide 25 mg p.o. daily at bedtime. 12.Ambien 10 mg p.o. daily at bedtime as needed for insomnia.  REVIEW OF SYSTEMS:  GENERAL:  Positive for fatigue.  Negative for anorexia, unintentional weight loss.  Positive for chills.  ENT: Negative for ear pain, nasal congestion, sore throat.  CV: Negative chest pain, palpitation  lower extremity edema.  RESPIRATORY:  See HPI. Negative for cough.  MUSCULOSKELETAL:  Negative for joint pain, muscle weakness.  NEURO:  Positive for headache.  Negative for visual disturbances, numbness, tingling of extremities.  GI: Positive for some nausea.  Negative for vomiting, diarrhea, constipation, abdominal pain, melena.  GU:  See HPI.  PSYCH:  Positive for anxiety and depression. HEME:  Negative for any unusual bruising or bleeding.  LABORATORY DATA:  Sodium 138, potassium 3.2, chloride 96, CO2 of 32, BUN 12, creatinine less than 0.47.  WBC 8.6, hemoglobin 14.7, hematocrit 42.4, platelets 273, glucose 160, lactic acid 2.4, calcium 10.8. Urinalysis yields moderate leukocytes, cloudy in appearance, few bacteria, 11 to 20 WBCs.  PHYSICAL EXAMINATION:  VITAL SIGNS:  Temperature 98.1, blood pressure 136/74, heart rate 100, respirations 20, sats 95% on room air. GENERAL:  Awake, alert, sitting in chair, well nourished, anxious appearing, teary. HEENT:  Head is normocephalic, atraumatic.  Pupils equal, round, reactive to light.  EOMI.  Mucous membranes of her mouth are pink, slightly dry.  No obvious lesion or exudate in her nose or ears. NECK:  Supple.  No JVD.  Full range of motion.  No lymphadenopathy. CV:  Regular rate and rhythm.  No murmur, gallop or rub.  No lower extremity edema. RESPIRATORY:  No increased work of breathing.  Breath sounds clear to auscultation bilaterally.  No rhonchi, wheezes or rales. ABDOMEN:  Round, soft, positive bowel sounds throughout, nontender to palpation. NEURO:  Alert and oriented x3.  Speech clear.  Facial symmetry.  Cranial nerves II through XII grossly intact. MUSCULOSKELETAL:  Moves all extremities.  No joint swelling/erythema. Full range of motion. EXTREMITIES:  Without clubbing or cyanosis.  ASSESSMENT/PLAN: 1. Urinary tract infection, history of recurrent urinary tract     infections.  We will get will give IV antibiotics,  specifically     cefepime  1 g q.12, based on urine culture sensitivites on     07/05/2010 which grew E. coli that was sensitive and cefepime     and klebsiella pneu. which was pansensitive. 2. Hypokalemia.  Will replete and recheck, probably secondary to poor     p.o. intake due to intermittent nausea and anxiety. 3. Chronic obstructive pulmonary disease.  Chest x-ray on Jul 05, 2010, is negative.  Currently the patient is at her baseline.  She     does not wear home O2.  Will continue her Singulair and provide     p.r.n. nebulizers. 4. Sleep apnea.  Will request respiratory therapy, provide CPAP. 5. Diabetes.  Hemoglobin A1c of 7.1 on June 07, 2010.  Will continue  metformin and use sliding scale glycemic control. 6. Hypertension, currently controlled.  Will continue her HCTZ and     monitor. 7. Hypothyroidism.  TSH on June 07, 2010, was 2.18.  Will continue     her Synthroid. 8. Anxiety/depression.  Continue Paxil and Xanax. 9. Deep vein thrombosis prophylaxis.  Will use Lovenox. 10.Code status.  The patient is full code.  This assessment and plan was discussed with Dr. Reece Levy.  It was truly pleasure taking care of Ms. Steinmiller.     Radene Gunning, NP   ______________________________ Lottie Dawson, MD   KMB/MEDQ  D:  07/12/2010  T:  07/12/2010  Job:  [id number]  cc:   [name], MD [address] [place] [address]  Michael B. Melvyn Novas, MD, FCCP [address] [place] [address]  Electronically Signed by Alveta Heimlich REDDY  on 07/13/2010 05:31:46 PM Electronically Signed by Dyanne Carrel  on 08/02/2010 07:31:46 AM

## 2010-07-14 NOTE — Discharge Summary (Addendum)
Kirsten [name], Kirsten [name]              ACCOUNT NO.:  [id number]  MEDICAL RECORD NO.:  [id number]           PATIENT TYPE:  I  LOCATION:  7619                         FACILITY:  [facility]  PHYSICIAN:  Kirsten Dawson, MD       DATE OF BIRTH:  [birth year]  DATE OF ADMISSION:  07/12/2010 DATE OF DISCHARGE:  07/13/2010                              DISCHARGE SUMMARY   PRIMARY CARE PROVIDER:  Scarlette [name], M.D.  DISCHARGE DIAGNOSES: 1. Recurrent and resistant urinary tract infection. 2. Mild hypokalemia. 3. Chronic obstructive pulmonary disease. 4. Sleep apnea. 5. Diabetes type 2. 6. Hypertension. 7. Hypothyroidism. 8. Anxiety.  DIAGNOSTIC LABORATORY DATA:  WBC is 8.6, hemoglobin 14.7, hematocrit 42.4, platelets 273,000.  Sodium 138, potassium 3.2, chloride 96, CO2 of 30, BUN 12, creatinine less than 0.47, glucose 160, calcium 10.8. Lactic acid 2.4.  Urinalysis yields moderate leukocytes, cloudy in appearance, few bacteria, 11-20 WBCs.  DIAGNOSTIC IMAGING:  None.  CONSULTS:  None.  PROCEDURES DONE:  PICC line was inserted on Jul 12, 2010.  BRIEF HISTORY:  Kirsten [name] is a 55 year old female with a history of COPD, diabetes type 2, hypertension, anxiety and recurrent UTI, all of which were diagnosed within the last 18 months.  She was referred to the [facility] Emergency Room on Jul 12, 2010 by her primary care provider with a chief complaint of urinary tract infection.  She reported that she had been on Macrobid earlier this month for urinary tract infection. She reports that she completed her course of antibiotics and continued to have frequency of urination followed as her urine as well as intermittent nausea and headaches.  She indicates she left urine specimen with her primary care provider last week and was informed yesterday that her UTI was "resistant."  Primary care provider recommended that she come to the Emergency Room for IV antibiotics.  The hospitalist were asked to  admit.  HOSPITAL COURSE BY PROBLEM: 1. Recurrent and resistant urinary tract infection:  Labs on Jul 05, 2010 showed E. Coli urine sensitive only to cefepime, tobramycin     and Bactrim.  Labs on Jul 05, 2010, urine showed Klebsiella     pneumoniae that was pan sensitive.  Patient is being discharged     with home health and cefepime 1 gram IV b.i.d. until July 21, 2010. 2. Mild hypokalemia:  Potassium was repleted and remained somewhat     low.  We will replete again on the day of discharge.  Patient will     need to follow up with her primary care provider in 10-14 days and     get a BMET to check her potassium level. 3. COPD:  Remained at baseline during her hospitalization.  No     exacerbations.  No wheezing.  Continue her Singulair. 4. Sleep apnea:  Patient was continued on CPAP at night. 5. Diabetes type 2:  Was controlled with sliding scale glycemic     control during her hospitalization.  She will continue metformin at     discharge. 6. Hypertension:  Remained controlled during her hospitalization. 7. Hypothyroidism:  Continue  her Synthroid. 8. Anxiety:  Remained at baseline.  Continue her Xanax.  PHYSICAL EXAMINATION:  Physical exam was documented in note dated Jul 13, 2010.  ACTIVITY:  Ad lib.  DIET:  Carb modified.  FOLLOWUP:  Follow up with Dr. Scarlette [name] in 10-14 days.  BMET will need to be drawn to check her potassium level.  Home health is being arranged for her to receive her cefepime 1 gram IV b.i.d. until July 21, 2010.  DISPOSITION:  Patient is medically stable and ready for discharge to home.  Time spent on this discharge 35 minutes.   Addendum: Spoke with Dr. Mallie [name] who will arrange for ertipenem instead of cefepime after discharge.    Kirsten Gunning, NP   ______________________________ Kirsten Dawson, MD    KMB/MEDQ  D:  07/13/2010  T:  07/13/2010  Job:  [id number]  cc:   Kirsten Calico, MD Kirsten [name]  [id number]  Electronically Signed by Kirsten [name]  on 07/14/2010 05:18:03 PM Electronically Signed by Kirsten [name]  on 08/02/2010 07:31:48 AM

## 2010-07-14 NOTE — Discharge Summary (Addendum)
  [name], [name]              ACCOUNT NO.:  [id number]  MEDICAL RECORD NO.:  [id number]           PATIENT TYPE:  I  LOCATION:  9249                         [facility]ACILITY[facility]  PHYSICIAN:  Lottie Dawson, MD       DATE OF BIRTH:  1955/12/24  DATE OF ADMISSION:  07/12/2010 DATE OF DISCHARGE:                              DISCHARGE SUMMARY   ADDENDUM  This is an addendum to her discharge summary dictated earlier today.  DISCHARGE MEDICATIONS: 1. Cefepime 1 gram IV every 12 hours for 10 days, last dose July 21, 2010. 2. Hydrocodone/APAP 5/325 mg 1-2 tablets p.o. every 4 hours as needed     for pain. 3. Ambien 10 mg p.o. daily at bedtime as needed for insomnia. 4. Hydrochlorothiazide 25 mg p.o. daily at bedtime. 5. Ibuprofen 200 mg 3 tablets p.o. every 8 hours as needed for     headache. 6. Metformin 500 mg p.o. b.i.d. 7. Multivitamin with iron 1 tablet p.o. daily. 8. Nascobal 500 mcg nasal spray one spray nasally every 7 days, takes     on Sundays. 9. Paxil 25 mg p.o. daily at bedtime. 10.Singulair 10 mg p.o. daily at bedtime. 11.Synthroid 75 mcg p.o. daily at bedtime. 12.Tylenol Extra Strength 500 mg 2 tablets p.o. every 8 hours as     needed for headache. 13.Vitamin B complex 1 tablet p.o. daily. 14.Xanax 0.25 mg p.o. daily as needed for anxiety.    Addendum: Spoke with Dr. Mallie Mussel who will arrange for ertipenem instead of cefepime after discharge.   Radene Gunning, NP   ______________________________ Lottie Dawson, MD    KMB/MEDQ  D:  07/13/2010  T:  07/13/2010  Job:  [id number]  Electronically Signed by Alveta Heimlich REDDY  on 07/14/2010 05:18:16 PM Electronically Signed by Dyanne Carrel  on 08/02/2010 07:31:52 AM

## 2010-07-20 ENCOUNTER — Other Ambulatory Visit: Payer: Self-pay | Admitting: Internal Medicine

## 2010-07-24 ENCOUNTER — Other Ambulatory Visit: Payer: Self-pay | Admitting: Internal Medicine

## 2010-08-02 ENCOUNTER — Ambulatory Visit (INDEPENDENT_AMBULATORY_CARE_PROVIDER_SITE_OTHER): Payer: Managed Care, Other (non HMO) | Admitting: Internal Medicine

## 2010-08-02 ENCOUNTER — Encounter: Payer: Self-pay | Admitting: Internal Medicine

## 2010-08-02 ENCOUNTER — Other Ambulatory Visit (INDEPENDENT_AMBULATORY_CARE_PROVIDER_SITE_OTHER): Payer: Managed Care, Other (non HMO)

## 2010-08-02 ENCOUNTER — Ambulatory Visit: Payer: Managed Care, Other (non HMO) | Admitting: Adult Health

## 2010-08-02 DIAGNOSIS — I10 Essential (primary) hypertension: Secondary | ICD-10-CM

## 2010-08-02 DIAGNOSIS — N390 Urinary tract infection, site not specified: Secondary | ICD-10-CM

## 2010-08-02 DIAGNOSIS — E876 Hypokalemia: Secondary | ICD-10-CM

## 2010-08-02 DIAGNOSIS — E119 Type 2 diabetes mellitus without complications: Secondary | ICD-10-CM

## 2010-08-02 DIAGNOSIS — Z23 Encounter for immunization: Secondary | ICD-10-CM

## 2010-08-02 LAB — BASIC METABOLIC PANEL
BUN: 12 mg/dL (ref 6–23)
Calcium: 10.2 mg/dL (ref 8.4–10.5)
Creatinine, Ser: 0.6 mg/dL (ref 0.4–1.2)
GFR: 109.05 mL/min (ref >60.00)

## 2010-08-02 LAB — URINALYSIS, ROUTINE W REFLEX MICROSCOPIC
Bilirubin Urine: NEGATIVE
Hgb urine dipstick: NEGATIVE
Nitrite: NEGATIVE
Total Protein, Urine: NEGATIVE
Urine Glucose: NEGATIVE
pH: 5.5 (ref 5.0–8.0)

## 2010-08-02 LAB — HEMOGLOBIN A1C: Hgb A1c MFr Bld: 7.2 % — ABNORMAL HIGH (ref 4.6–6.5)

## 2010-08-02 MED ORDER — METFORMIN HCL 1000 MG PO TABS: 1000.0000 mg | ORAL_TABLET | Freq: Two times a day (BID) | ORAL | Status: DC

## 2010-08-02 NOTE — Assessment & Plan Note (Signed)
Check urine culture today, continue keflex, and keep f/up appt with urology

## 2010-08-02 NOTE — Assessment & Plan Note (Signed)
I will increase her metformin dose and check her A1C today

## 2010-08-02 NOTE — Assessment & Plan Note (Signed)
Her BP is well controlled 

## 2010-08-02 NOTE — Patient Instructions (Signed)
Diabetes, Type 2 Diabetes is a lasting (chronic) disease. In type 2 diabetes, the pancreas does not make enough insulin (a hormone), and the body does not respond normally to the insulin that is made. This type of diabetes was also previously called adult onset diabetes. About 90% of all those who have diabetes have type 2. It usually occurs after the age of 40 but can occur at any age. CAUSES Unlike type 1 diabetes, which happens because insulin is no longer being made, type 2 diabetes happens because the body is making less insulin and has trouble using the insulin properly. SYMPTOMS  Drinking more than usual.   Urinating more than usual.   Blurred vision.   Dry, itchy skin.   Frequent infection like yeast infections in women.   More tired than usual (fatigue).  TREATMENT  Healthy eating.   Exercise.   Medication, if needed.   Monitoring blood glucose (sugar).   Seeing your caregiver regularly.  HOME CARE INSTRUCTIONS  Check your blood glucose (sugar) at least once daily. More frequent monitoring may be necessary, depending on your medications and on how well your diabetes is controlled. Your caregiver will advise you.   Take your medicine as directed by your caregiver.   Do not smoke.   Make wise food choices. Ask your caregiver for information. Weight loss can improve your diabetes.   Learn about low blood glucose (hypoglycemia) and how to treat it.   Get your eyes checked regularly.   Have a yearly physical exam. Have your blood pressure checked. Get your blood and urine tested.   Wear a pendant or bracelet saying that you have diabetes.   Check your feet every night for sores. Let your caregiver know if you have sores that are not healing.  SEEK MEDICAL CARE IF:  You are having problems keeping your blood glucose at target range.   You feel you might be having problems with your medicines.   You have symptoms of an illness that is not improving after 24  hours.   You have a sore or wound that is not healing.   You notice a change in vision or a new problem with your vision.   You develop a fever of more than 100.5.  Document Released: 01/29/2005 Document Re-Released: 02/20/2009 Exit[employer] Patient Information 2011 [employer]. 

## 2010-08-02 NOTE — Progress Notes (Signed)
  Subjective:    Patient ID: Kirsten [name], female    DOB: [birth year], 55 y.o.   MRN: [id number]  HPI She returns for f/up after a recent UTI with a urine culture that was + for 2 organisms that showed some resistance and with her allergies it required IV antibiotics. She has seen Dr. Risa Grill of urology and is on keflex qd. She tells me that her bladder still feels "full" but she has no other urinary symptoms. She tells me that her blood sugars have been high as well, up to 160-170 throughout the day. When she was in the hospital she was treated for a low potassium as well. She did not tolerate tradjenta due to a bad taste and dry mouth.   Review of Systems  Constitutional: Negative.   HENT: Negative.   Eyes: Negative.   Respiratory: Negative.   Cardiovascular: Negative.   Gastrointestinal: Negative.   Genitourinary: Negative.   Musculoskeletal: Negative.   Skin: Negative.   Neurological: Negative.   Hematological: Negative.   Psychiatric/Behavioral: Negative.        Objective:   Physical Exam  Vitals reviewed. Constitutional: She is oriented to person, place, and time. She appears well-developed and well-nourished. No distress.  HENT:  Head: Normocephalic and atraumatic.  Right Ear: External ear normal.  Left Ear: External ear normal.  Nose: Nose normal.  Mouth/Throat: Oropharynx is clear and moist. No oropharyngeal exudate.  Eyes: Conjunctivae and EOM are normal. Pupils are equal, round, and reactive to light. Right eye exhibits no discharge. Left eye exhibits no discharge. No scleral icterus.  Neck: Normal range of motion. Neck supple. No JVD present. No tracheal deviation present. No thyromegaly present.  Cardiovascular: Normal rate, regular rhythm, normal heart sounds and intact distal pulses.  Exam reveals no gallop and no friction rub.   No murmur heard. Pulmonary/Chest: Effort normal and breath sounds normal. No stridor. No respiratory distress. She has no wheezes. She  has no rales. She exhibits no tenderness.  Abdominal: Soft. Bowel sounds are normal. She exhibits no distension and no mass. There is no tenderness. There is no rebound and no guarding.  Musculoskeletal: Normal range of motion. She exhibits no edema and no tenderness.  Lymphadenopathy:    She has no cervical adenopathy.  Neurological: She is alert and oriented to person, place, and time. She has normal reflexes. She displays normal reflexes. No cranial nerve deficit. She exhibits normal muscle tone. Coordination normal.  Skin: Skin is warm and dry. No rash noted. She is not diaphoretic. No erythema. No pallor.  Psychiatric: She has a normal mood and affect. Her behavior is normal. Judgment and thought content normal.         Lab Results  Component Value Date   WBC 8.6 07/12/2010   HGB 14.7 07/12/2010   HCT 42.4 07/12/2010   PLT 273 07/12/2010   CHOL 197 06/07/2010   TRIG 306.0* 06/07/2010   HDL 40.70 06/07/2010   LDLDIRECT 114.7 06/07/2010   ALT 36* 06/07/2010   AST 28 06/07/2010   NA 140 07/13/2010   K 3.3* 07/13/2010   CL 102 07/13/2010   CREATININE <0.47 07/13/2010   BUN 13 07/13/2010   CO2 32 07/13/2010   TSH 2.18 06/07/2010   HGBA1C 7.1* 06/07/2010   MICROALBUR 2.6* 06/20/2009   Assessment & Plan:

## 2010-08-02 NOTE — Assessment & Plan Note (Signed)
Check K+ level today

## 2010-08-03 ENCOUNTER — Encounter: Payer: Self-pay | Admitting: Internal Medicine

## 2010-08-04 ENCOUNTER — Other Ambulatory Visit: Payer: Self-pay | Admitting: Internal Medicine

## 2010-08-04 NOTE — Telephone Encounter (Signed)
Please advise regarding RF request for Kirsten [name] pt - thanks

## 2010-08-06 NOTE — Telephone Encounter (Signed)
Ok for refills

## 2010-08-08 LAB — CULTURE, URINE COMPREHENSIVE: Colony Count: 50000

## 2010-08-11 NOTE — Telephone Encounter (Signed)
oik for refillx 5

## 2010-08-21 ENCOUNTER — Telehealth: Payer: Self-pay | Admitting: Internal Medicine

## 2010-08-21 DIAGNOSIS — G4730 Sleep apnea, unspecified: Secondary | ICD-10-CM

## 2010-08-21 DIAGNOSIS — R0600 Dyspnea, unspecified: Secondary | ICD-10-CM

## 2010-08-21 NOTE — Telephone Encounter (Signed)
Explain we are sorry for the order not going thru Want ONO on CPAP to see if she needs O2.  Place order , Make sure she has follow up ov in next 4 weeks  Please contact office for sooner follow up if symptoms do not improve or worsen or seek emergency care

## 2010-08-21 NOTE — Telephone Encounter (Signed)
Called, spoke with pt.  Apologized for  The order not going thru.  Advised this will be placed and she will receive another call regarding this.  OV scheduled with MW for 09/18/10 at 9 am -- pt aware.

## 2010-08-21 NOTE — Telephone Encounter (Signed)
I see per last OV note that TP wanted to order an ONO but order was not placed. I advised the pt we will get this taken care. Pt states understanding.  Tammy please advise if this ONO is to be on RA or on Cpap. Thanks. [place], CMA

## 2010-09-05 ENCOUNTER — Encounter: Payer: Self-pay | Admitting: Internal Medicine

## 2010-09-13 ENCOUNTER — Encounter: Payer: Self-pay | Admitting: Internal Medicine

## 2010-09-18 ENCOUNTER — Ambulatory Visit: Payer: Managed Care, Other (non HMO) | Admitting: Internal Medicine

## 2010-09-29 ENCOUNTER — Encounter: Payer: Self-pay | Admitting: Internal Medicine

## 2010-09-29 ENCOUNTER — Ambulatory Visit (INDEPENDENT_AMBULATORY_CARE_PROVIDER_SITE_OTHER): Payer: Managed Care, Other (non HMO) | Admitting: Internal Medicine

## 2010-09-29 DIAGNOSIS — R0989 Other specified symptoms and signs involving the circulatory and respiratory systems: Secondary | ICD-10-CM

## 2010-09-29 DIAGNOSIS — R059 Cough, unspecified: Secondary | ICD-10-CM

## 2010-09-29 DIAGNOSIS — R05 Cough: Secondary | ICD-10-CM

## 2010-09-29 DIAGNOSIS — G4730 Sleep apnea, unspecified: Secondary | ICD-10-CM

## 2010-09-29 DIAGNOSIS — R0600 Dyspnea, unspecified: Secondary | ICD-10-CM

## 2010-09-29 DIAGNOSIS — R0609 Other forms of dyspnea: Secondary | ICD-10-CM

## 2010-09-29 MED ORDER — LEVOFLOXACIN 750 MG PO TABS: 750.0000 mg | ORAL_TABLET | Freq: Every day | ORAL | Status: AC

## 2010-09-29 NOTE — Progress Notes (Signed)
Subjective:    Patient ID: Kirsten [name], female    DOB: 1955/11/10, 55 y.o.   MRN: [id number]  HPI  39  yowf minimal remote smoking socially works as Research scientist (physical sciences) for a [employer] and not 100% since 2007 after episode of pna with intermittently unexplained sob @ rest as bad as with exercise for which she had w/u by Dr Chancy Milroy no dx but seemed better after singulair 100% but while on it then stopped it and gradually worse again but put up with it chronically since then.  Mar 02 2009 flew to [facility] then the next day scratchy throat and cough ? allergy to dog few back home next day felt tired then after sleeping woke up 1 am severe sob, wheezing > [place] ER with rx neb better enough to go home but next day readmitted x 11 days rx with tamiflu > at discharge still weak, still sob and wheezing heart racing but fever gone, some cp right chest improving gradually.   April 08, 2009 cc still intermittent cp beneath right breast but much better than it was, short of breath speaking and quite hoarse some short of breath grocery stop. Imp was ? vcd/gerd rec Acid reflux is a leading suspect here (for your hoarseness and throat irritation and needs to be eliminated completely before considering additional studies or treatment options. To suppress this maximally, take prilosec 30 min before first meal and pepcid 20 mg (otc) at bedtime plus diet measures as listed.  Stop symbicort    September 22, 2009 Followup on cxr and pft's. Pt states that overall her breathing is the same- gets SOB some with talking and notices some wheezing when she lies down. No new complaints today. uses cpap at night and this eliminates the wheezes. better after rehab. Dx mod restrictive changes, no desat with ex>  rec wt loss.    07/05/10 Acute OV/NP Presents for a work in visit. Complains of 2 weeks of worsening DOE and wheezing. No cough , discolored mucus or sinus congestion , gerd. Feels tired and worn out. NO sleeping well. NO dyspnea with  sleeping. Uses CPAP at night- no O2.  She is very frustrated , feels that since her critical illness with H1N1 she has never been the same. She has gained >30lbs, has no energy, wears out easily., breath gives out with walking esp with incline. She does have occasional wheezing but mainly only at incline. No exertional chest pain, palpitations or syncope. She did complete pulm. Rehab in past but it was very stressful due to having to juggle work schedule. She does not exercise at all. No discolored mucus or cough.  Recent labs reveiwed w/ nml tsh.  .Begin Symbicort 80/4.63mg 2 puffs Twice daily  -brush/rinse and gargle after use.  We are setting you up for an overnight oximetry> ok on cpap and RA  09/29/2010 f/u ov/Kirsten [name] cc sinus congestion, cough, sob > green mucus mostly in am's x 3  days, started with sore throat better. Poor sleep on cpap. Never really feels better x years then gets flare of cough and sob typically in setting of rhinitis flare.  Sob with more than slow adls.   Pt denies any significant  dysphagia, itching, sneezing,  nasal congestion or excess/ purulent secretions,  fever, chills, sweats, unintended wt loss, pleuritic or exertional cp, hempoptysis, orthopnea pnd or leg swelling.    Also denies any obvious fluctuation of symptoms with weather or environmental changes or other aggravating or alleviating factors.  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 CAP 02/2009  - CT chest [place] 03/28/09 c/w PF with baseline cxr nl 03/09/09  - f/u PFT's September 20, 2009 FEV1 1.55 (57%) ratio 83 with DLC0 58% and no desat x 3 laps  - Completed rehab June 2011  Born with [employer], rotated organs abdominal repair until age 11  - sp Small Bowel Obstruction, resolved with NG/ bowel rest around 2009    Anxiety Disorder  GERD  Irritable Bowel Syndrome  Obesity           Objective:   Physical Exam GEN: A/Ox3; pleasant , NAD, obese with hopeless/helpless affect  Wt 218 09/29/10  HEENT:  [place]/AT,  EACs-clear, TMs-wnl, NOSE-clear, THROAT-clear, no lesions, no postnasal drip or exudate noted.   NECK:  Supple w/ fair ROM; no JVD; normal carotid impulses w/o bruits; no thyromegaly or nodules palpated; no lymphadenopathy.  RESP  Clear  P & A; w/o, wheezes/ rales/ or rhonchi.no accessory muscle use, no dullness to percussion  CARD:  RRR, no m/r/g  , no peripheral edema, pulses intact, no cyanosis or clubbing.  GI:   Soft & nt; nml bowel sounds; no organomegaly or masses detected.  Musco: Warm bil, no deformities or joint swelling noted.   Neuro: alert, no focal deficits noted.    Skin: Warm, no lesions or rashes    cxr 07/05/2010  No acute finding. Stable compared prior exam.         Assessment & Plan:

## 2010-09-29 NOTE — Patient Instructions (Addendum)
Work on Engineer, technical sales technique:  relax and gently blow all the way out then take a nice smooth deep breath back in, triggering the inhaler at same time you start breathing in.  Hold for up to 5 seconds if you can. Blow back out through the nose.  Rinse and gargle with water when done   If your mouth or throat starts to bother you,   I suggest you time the inhaler to your dental care and after using the inhaler(s) brush teeth and tongue with a baking soda containing toothpaste and when you rinse this out, gargle with it first to see if this helps your mouth and throat.    Try prilosec 80m  Take 30-60 min before first meal of the day and Pepcid 20 mg one bedtime until cough is completely gone for at least a week without the need for cough suppression  I think of reflux for chronic cough like I do oxygen for fire (doesn't cause the fire but once you get the oxygen suppressed it usually goes away regardless of the exact cause).   GERD (REFLUX)  is an extremely common cause of respiratory symptoms, many times with no significant heartburn at all.    It can be treated with medication, but also with lifestyle changes including avoidance of late meals, excessive alcohol, smoking cessation, and avoid fatty foods, chocolate, peppermint, colas, red wine, and acidic juices such as orange juice.  NO MINT OR MENTHOL PRODUCTS SO NO COUGH DROPS  USE SUGARLESS CANDY INSTEAD (jolley ranchers or Stover's)  NO OIL BASED VITAMINS   Levaquin 750 mg daily x 5 days (let pharmacy know you have allergy to factive but able to take Levaquin  per your recollection)  Please see patient coordinator before you leave today  to schedule sinus ct  Please schedule a follow up office visit in 2  weeks, sooner if needed - bring all active medications with you to the visit

## 2010-09-30 ENCOUNTER — Encounter: Payer: Self-pay | Admitting: Internal Medicine

## 2010-09-30 DIAGNOSIS — R0600 Dyspnea, unspecified: Secondary | ICD-10-CM | POA: Insufficient documentation

## 2010-09-30 DIAGNOSIS — R0609 Other forms of dyspnea: Secondary | ICD-10-CM | POA: Insufficient documentation

## 2010-09-30 NOTE — Assessment & Plan Note (Signed)
The most common causes of chronic cough in immunocompetent adults include the following: upper airway cough syndrome (UACS), previously referred to as postnasal drip syndrome (PNDS), which is caused by variety of rhinosinus conditions; (2) asthma; (3) GERD; (4) chronic bronchitis from cigarette smoking or other inhaled environmental irritants; (5) nonasthmatic eosinophilic bronchitis; and (6) bronchiectasis.   These conditions, singly or in combination, have accounted for up to 94% of the causes of chronic cough in prospective studies.   Other conditions have constituted no >6% of the causes in prospective studies These have included bronchogenic carcinoma, chronic interstitial pneumonia, sarcoidosis, left ventricular failure, ACEI-induced cough, and aspiration from a condition associated with pharyngeal dysfunction.  This is most c/w  Classic Upper airway cough syndrome, so named because it's frequently impossible to sort out how much is  CR/sinusitis with freq throat clearing (which can be related to primary GERD)   vs  causing  secondary (" extra esophageal")  GERD from wide swings in gastric pressure that occur with throat clearing, often  promoting self use of mint and menthol lozenges that reduce the lower esophageal sphincter tone and exacerbate the problem further in a cyclical fashion.   These are the same pts who not infrequently have failed to tolerate ace inhibitors,  dry powder inhalers or biphosphonates or report having reflux symptoms that don't respond to standard doses of PPI , and are easily confused as having aecopd or asthma flares,  Will rx as sinusitis and f/u q 2 weeks  The standardized cough guidelines recently published in Chest by Lissa Morales in 2006  are a multiple step process (up to 12!) , not a single office visit,  and are intended  to address this problem logically,  with an alogrithm dependent on response to empiric treatment at  each progressive step  to determine a  specific diagnosis with  minimal addtional testing needed. Therefore if compliance is an issue or can't be accurately verified then it's very unlikely the standard evaluation and treatment will be successful here.    Furthermore, response to therapy (other than acute cough suppression, which should only be used short term with avoidance of narcotic containing cough syrups if possible), can be a gradual process for which the patient may not receive immediate benefit.  Unlike going to an eye doctor where the right rx is almost always the first one and is immediately effective, this is almost never the case in the management of chronic cough syndromes and the patient needs to commit up front to compliance with recommendations and have the patience to wait out a response for up to 6 weeks of therapy directed at the likely underlying problem(s).

## 2010-09-30 NOTE — Assessment & Plan Note (Signed)
Reviewed with pt ono RA/ cpap showing ok sats, no 02 needed.  Continue cpap

## 2010-09-30 NOTE — Assessment & Plan Note (Addendum)
Symptoms are markedly disproportionate to objective findings and not clear this is a lung problem but pt does appear to have difficult airway management issues.   DDX of  difficult airways managment all start with A and  include Adherence, Ace Inhibitors, Acid Reflux, Active Sinus Disease, Alpha 1 Antitripsin deficiency, Anxiety masquerading as Airways dz,  ABPA,  allergy(esp in young), Aspiration (esp in elderly), Adverse effects of DPI,  Active smokers, plus two Bs  = Bronchiectasis and Beta blocker use..and one C= CHF   ? Adherence is always the initial "prime suspect" and is a multilayered concern that requires a "trust but verify" approach in every patient - starting with knowing how to use medications, especially inhalers, correctly, keeping up with refills and understanding the fundamental difference between maintenance and prns vs those medications only taken for a very short course and then stopped and not refilled.   The proper method of use, as well as anticipated side effects, of this metered-dose inhaler are discussed and demonstrated to the patient. Improved to 75% with repeated coaching  ? Acid reflux > diet/ rx reviewed.  See instructions for specific recommendations which were reviewed directly with the patient who was given a copy with highlighter outlining the key components.   ? Active Sinus dz > sinus ct planned

## 2010-10-03 ENCOUNTER — Other Ambulatory Visit: Payer: Managed Care, Other (non HMO)

## 2010-10-04 ENCOUNTER — Ambulatory Visit: Payer: Managed Care, Other (non HMO) | Admitting: Internal Medicine

## 2010-10-05 ENCOUNTER — Telehealth: Payer: Self-pay | Admitting: Internal Medicine

## 2010-10-05 NOTE — Telephone Encounter (Signed)
Per MW- ONO with CPAP on RA looks good, can continue CPAP alone. Spoke with pt and notified of this and she verbalized understanding.

## 2010-10-19 ENCOUNTER — Encounter: Payer: Self-pay | Admitting: Internal Medicine

## 2010-11-04 ENCOUNTER — Other Ambulatory Visit: Payer: Self-pay | Admitting: Internal Medicine

## 2010-11-29 ENCOUNTER — Ambulatory Visit (INDEPENDENT_AMBULATORY_CARE_PROVIDER_SITE_OTHER): Payer: Managed Care, Other (non HMO) | Admitting: Internal Medicine

## 2010-11-29 ENCOUNTER — Encounter: Payer: Self-pay | Admitting: Internal Medicine

## 2010-11-29 VITALS — BP 142/94 | HR 80 | Temp 98.0°F | Resp 16 | Wt 215.0 lb

## 2010-11-29 DIAGNOSIS — N390 Urinary tract infection, site not specified: Secondary | ICD-10-CM

## 2010-11-29 DIAGNOSIS — E119 Type 2 diabetes mellitus without complications: Secondary | ICD-10-CM

## 2010-11-29 DIAGNOSIS — E039 Hypothyroidism, unspecified: Secondary | ICD-10-CM

## 2010-11-29 DIAGNOSIS — I10 Essential (primary) hypertension: Secondary | ICD-10-CM

## 2010-11-29 DIAGNOSIS — E538 Deficiency of other specified B group vitamins: Secondary | ICD-10-CM

## 2010-11-29 DIAGNOSIS — Z23 Encounter for immunization: Secondary | ICD-10-CM

## 2010-11-29 DIAGNOSIS — E876 Hypokalemia: Secondary | ICD-10-CM

## 2010-11-29 DIAGNOSIS — E785 Hyperlipidemia, unspecified: Secondary | ICD-10-CM

## 2010-11-29 MED ORDER — OLMESARTAN MEDOXOMIL-HCTZ 40-25 MG PO TABS: 1.0000 | ORAL_TABLET | Freq: Every day | ORAL | Status: DC

## 2010-11-29 NOTE — Patient Instructions (Signed)

## 2010-11-29 NOTE — Assessment & Plan Note (Signed)
Her urologist has her on cipro and keflex, today I will check her UA and urine culture

## 2010-11-29 NOTE — Assessment & Plan Note (Signed)
I will check her A1C and will monitor her renal function. Also will start benicar for renal protection.

## 2010-11-29 NOTE — Assessment & Plan Note (Signed)
I will check her CBC and her B12 level

## 2010-11-29 NOTE — Assessment & Plan Note (Signed)
Her BP is not well controlled, I have added benicar to the HCTZ and today I will check her lytes and renal function

## 2010-11-29 NOTE — Assessment & Plan Note (Signed)
I will check her K+ level today

## 2010-11-29 NOTE — Progress Notes (Signed)
Subjective:    Patient ID: Kirsten [name], female    DOB: 08-Jul-1955, 55 y.o.   MRN: [id number]  Diabetes She presents for her follow-up diabetic visit. She has type 2 diabetes mellitus. Her disease course has been stable. There are no hypoglycemic associated symptoms. Pertinent negatives for hypoglycemia include no dizziness, headaches, pallor, seizures, speech difficulty or tremors. Pertinent negatives for diabetes include no blurred vision, no chest pain, no fatigue, no foot paresthesias, no foot ulcerations, no polydipsia, no polyphagia, no polyuria, no visual change, no weakness and no weight loss. There are no hypoglycemic complications. Symptoms are stable. There are no diabetic complications. Current diabetic treatment includes oral agent (monotherapy). She is compliant with treatment all of the time. Her weight is stable. She is following a generally healthy diet. Meal planning includes avoidance of concentrated sweets. She has not had a previous visit with a dietician. She participates in exercise intermittently. There is no change in her home blood glucose trend. An ACE inhibitor/angiotensin II receptor blocker is not being taken. She does not see a podiatrist.Eye exam is current.      Review of Systems  Constitutional: Negative for fever, chills, weight loss, diaphoresis, activity change, appetite change, fatigue and unexpected weight change.  HENT: Negative.   Eyes: Negative.  Negative for blurred vision.  Respiratory: Negative for apnea, cough, choking, chest tightness, shortness of breath, wheezing and stridor.   Cardiovascular: Negative for chest pain, palpitations and leg swelling.  Gastrointestinal: Negative for nausea, vomiting, abdominal pain, diarrhea, constipation, blood in stool, abdominal distention and anal bleeding.  Genitourinary: Positive for urgency and frequency. Negative for dysuria, polyuria, hematuria, flank pain, decreased urine volume, vaginal bleeding, enuresis,  difficulty urinating, genital sores and dyspareunia.  Musculoskeletal: Negative for myalgias, back pain, joint swelling, arthralgias and gait problem.  Skin: Negative for color change, pallor and rash.  Neurological: Negative for dizziness, tremors, seizures, syncope, facial asymmetry, speech difficulty, weakness, light-headedness, numbness and headaches.  Hematological: Negative for polydipsia, polyphagia and adenopathy. Does not bruise/bleed easily.  Psychiatric/Behavioral: Negative.        Objective:   Physical Exam  Vitals reviewed. Constitutional: She is oriented to person, place, and time. She appears well-developed and well-nourished. No distress.  HENT:  Head: Normocephalic and atraumatic.  Mouth/Throat: Oropharynx is clear and moist. No oropharyngeal exudate.  Eyes: Conjunctivae are normal. Right eye exhibits no discharge. Left eye exhibits no discharge. No scleral icterus.  Neck: Normal range of motion. Neck supple. No JVD present. No tracheal deviation present. No thyromegaly present.  Cardiovascular: Normal rate, regular rhythm, normal heart sounds and intact distal pulses.  Exam reveals no gallop and no friction rub.   No murmur heard. Pulmonary/Chest: Effort normal and breath sounds normal. No stridor. No respiratory distress. She has no wheezes. She has no rales. She exhibits no tenderness.  Abdominal: Soft. Bowel sounds are normal. She exhibits no distension and no mass. There is no tenderness. There is no rebound and no guarding.  Musculoskeletal: Normal range of motion. She exhibits no edema and no tenderness.  Lymphadenopathy:    She has no cervical adenopathy.  Neurological: She is oriented to person, place, and time. She displays normal reflexes. She exhibits normal muscle tone. Coordination normal.  Skin: Skin is warm and dry. No rash noted. She is not diaphoretic. No erythema. No pallor.  Psychiatric: She has a normal mood and affect. Her behavior is normal. Judgment  and thought content normal.      Lab Results  Component  Value Date   WBC 8.6 07/12/2010   HGB 14.7 07/12/2010   HCT 42.4 07/12/2010   PLT 273 07/12/2010   GLUCOSE 190* 08/02/2010   CHOL 197 06/07/2010   TRIG 306.0* 06/07/2010   HDL 40.70 06/07/2010   LDLDIRECT 114.7 06/07/2010   LDLCALC 98.8 02/02/2010   ALT 36* 06/07/2010   AST 28 06/07/2010   NA 139 08/02/2010   K 3.5 08/02/2010   CL 99 08/02/2010   CREATININE 0.6 08/02/2010   BUN 12 08/02/2010   CO2 31 08/02/2010   TSH 2.18 06/07/2010   HGBA1C 7.2* 08/02/2010   MICROALBUR 2.6* 06/20/2009      Assessment & Plan:

## 2010-11-29 NOTE — Assessment & Plan Note (Signed)
TSH today

## 2010-12-05 ENCOUNTER — Other Ambulatory Visit (INDEPENDENT_AMBULATORY_CARE_PROVIDER_SITE_OTHER): Payer: Managed Care, Other (non HMO)

## 2010-12-05 DIAGNOSIS — E538 Deficiency of other specified B group vitamins: Secondary | ICD-10-CM

## 2010-12-05 DIAGNOSIS — E119 Type 2 diabetes mellitus without complications: Secondary | ICD-10-CM

## 2010-12-05 DIAGNOSIS — E039 Hypothyroidism, unspecified: Secondary | ICD-10-CM

## 2010-12-05 DIAGNOSIS — N390 Urinary tract infection, site not specified: Secondary | ICD-10-CM

## 2010-12-05 DIAGNOSIS — E785 Hyperlipidemia, unspecified: Secondary | ICD-10-CM

## 2010-12-05 DIAGNOSIS — E876 Hypokalemia: Secondary | ICD-10-CM

## 2010-12-05 DIAGNOSIS — I10 Essential (primary) hypertension: Secondary | ICD-10-CM

## 2010-12-05 LAB — CBC WITH DIFFERENTIAL/PLATELET
Basophils Absolute: 0 10*3/uL (ref 0.0–0.1)
Eosinophils Absolute: 0.3 10*3/uL (ref 0.0–0.7)
Lymphocytes Relative: 37.3 % (ref 12.0–46.0)
MCHC: 34.5 g/dL (ref 30.0–36.0)
MCV: 89.5 fl (ref 78.0–100.0)
Monocytes Absolute: 0.5 10*3/uL (ref 0.1–1.0)
Neutrophils Relative %: 53.6 % (ref 43.0–77.0)
Platelets: 273 10*3/uL (ref 150.0–400.0)
RBC: 4.96 Mil/uL (ref 3.87–5.11)
RDW: 13.2 % (ref 11.5–14.6)

## 2010-12-05 LAB — URINALYSIS, ROUTINE W REFLEX MICROSCOPIC
Bilirubin Urine: NEGATIVE
Leukocytes, UA: NEGATIVE
Nitrite: NEGATIVE
Specific Gravity, Urine: >=1.03 (ref 1.000–1.030)
Total Protein, Urine: NEGATIVE
pH: 5.5 (ref 5.0–8.0)

## 2010-12-05 LAB — VITAMIN B12: Vitamin B-12: 222 pg/mL (ref 211–911)

## 2010-12-05 LAB — TSH: TSH: 2.11 u[IU]/mL (ref 0.35–5.50)

## 2010-12-05 LAB — COMPREHENSIVE METABOLIC PANEL
AST: 28 U/L (ref 0–37)
Albumin: 4.3 g/dL (ref 3.5–5.2)
Alkaline Phosphatase: 76 U/L (ref 39–117)
Glucose, Bld: 186 mg/dL — ABNORMAL HIGH (ref 70–99)
Potassium: 3.6 mEq/L (ref 3.5–5.1)
Sodium: 140 mEq/L (ref 135–145)
Total Bilirubin: 0.4 mg/dL (ref 0.3–1.2)
Total Protein: 7 g/dL (ref 6.0–8.3)

## 2010-12-05 LAB — HEMOGLOBIN A1C: Hgb A1c MFr Bld: 8.6 % — ABNORMAL HIGH (ref 4.6–6.5)

## 2010-12-06 ENCOUNTER — Encounter: Payer: Self-pay | Admitting: Internal Medicine

## 2010-12-09 LAB — CULTURE, URINE COMPREHENSIVE

## 2010-12-11 ENCOUNTER — Encounter: Payer: Self-pay | Admitting: Internal Medicine

## 2011-01-10 ENCOUNTER — Other Ambulatory Visit: Payer: Self-pay | Admitting: Internal Medicine

## 2011-01-15 ENCOUNTER — Other Ambulatory Visit: Payer: Self-pay | Admitting: Internal Medicine

## 2011-01-29 ENCOUNTER — Other Ambulatory Visit: Payer: Self-pay | Admitting: Internal Medicine

## 2011-01-29 ENCOUNTER — Encounter: Payer: Self-pay | Admitting: Internal Medicine

## 2011-01-29 ENCOUNTER — Ambulatory Visit (INDEPENDENT_AMBULATORY_CARE_PROVIDER_SITE_OTHER): Payer: Managed Care, Other (non HMO) | Admitting: Internal Medicine

## 2011-01-29 ENCOUNTER — Other Ambulatory Visit (INDEPENDENT_AMBULATORY_CARE_PROVIDER_SITE_OTHER): Payer: Managed Care, Other (non HMO)

## 2011-01-29 VITALS — BP 110/68 | HR 80 | Temp 98.6°F | Resp 16 | Wt 212.0 lb

## 2011-01-29 DIAGNOSIS — E039 Hypothyroidism, unspecified: Secondary | ICD-10-CM

## 2011-01-29 DIAGNOSIS — E785 Hyperlipidemia, unspecified: Secondary | ICD-10-CM

## 2011-01-29 DIAGNOSIS — E876 Hypokalemia: Secondary | ICD-10-CM

## 2011-01-29 DIAGNOSIS — N390 Urinary tract infection, site not specified: Secondary | ICD-10-CM

## 2011-01-29 DIAGNOSIS — I10 Essential (primary) hypertension: Secondary | ICD-10-CM

## 2011-01-29 DIAGNOSIS — E782 Mixed hyperlipidemia: Secondary | ICD-10-CM

## 2011-01-29 DIAGNOSIS — E781 Pure hyperglyceridemia: Secondary | ICD-10-CM | POA: Insufficient documentation

## 2011-01-29 DIAGNOSIS — E119 Type 2 diabetes mellitus without complications: Secondary | ICD-10-CM

## 2011-01-29 LAB — URINALYSIS, ROUTINE W REFLEX MICROSCOPIC
Bilirubin Urine: NEGATIVE
Hgb urine dipstick: NEGATIVE
Nitrite: NEGATIVE
Urine Glucose: NEGATIVE
Urobilinogen, UA: 0.2 (ref 0.0–1.0)

## 2011-01-29 LAB — LIPID PANEL
Cholesterol: 225 mg/dL — ABNORMAL HIGH (ref 0–200)
Triglycerides: 288 mg/dL — ABNORMAL HIGH (ref 0.0–149.0)

## 2011-01-29 LAB — COMPREHENSIVE METABOLIC PANEL
CO2: 29 mEq/L (ref 19–32)
Creatinine, Ser: 0.6 mg/dL (ref 0.4–1.2)
GFR: 122.66 mL/min (ref >60.00)
Glucose, Bld: 131 mg/dL — ABNORMAL HIGH (ref 70–99)
Total Bilirubin: 0.6 mg/dL (ref 0.3–1.2)

## 2011-01-29 LAB — LDL CHOLESTEROL, DIRECT: Direct LDL: 140.4 mg/dL

## 2011-01-29 LAB — TSH: TSH: 1.53 u[IU]/mL (ref 0.35–5.50)

## 2011-01-29 MED ORDER — VALSARTAN-HYDROCHLOROTHIAZIDE 160-12.5 MG PO TABS: 1.0000 | ORAL_TABLET | Freq: Every day | ORAL | Status: DC

## 2011-01-29 MED ORDER — PITAVASTATIN CALCIUM 2 MG PO TABS: 1.0000 | ORAL_TABLET | Freq: Every day | ORAL | Status: DC

## 2011-01-29 NOTE — Patient Instructions (Signed)

## 2011-01-29 NOTE — Assessment & Plan Note (Signed)
I will check her urine culture today

## 2011-01-29 NOTE — Assessment & Plan Note (Signed)
I will recheck her K+ level

## 2011-01-29 NOTE — Assessment & Plan Note (Signed)
She is due for a TSH check

## 2011-01-29 NOTE — Assessment & Plan Note (Signed)
I will check her a1c today to see if she has better blood sugar control

## 2011-01-29 NOTE — Assessment & Plan Note (Signed)
Start livalo and check her FLP today

## 2011-01-29 NOTE — Assessment & Plan Note (Signed)
Change to diovan-hct at her request

## 2011-01-29 NOTE — Progress Notes (Signed)
Subjective:    Patient ID: Kirsten [name], female    DOB: 05-Apr-1955, 55 y.o.   MRN: [id number]  Diabetes She presents for her follow-up diabetic visit. She has type 2 diabetes mellitus. Her disease course has been fluctuating. Pertinent negatives for hypoglycemia include no dizziness, headaches, pallor, seizures, speech difficulty, sweats or tremors. Pertinent negatives for diabetes include no blurred vision, no chest pain, no fatigue, no foot paresthesias, no foot ulcerations, no polydipsia, no polyphagia, no polyuria, no visual change, no weakness and no weight loss. There are no hypoglycemic complications. Symptoms are stable. There are no diabetic complications. Risk factors for coronary artery disease include no known risk factors. Current diabetic treatment includes oral agent (monotherapy). She is compliant with treatment all of the time. Her weight is stable. She is following a generally healthy diet. Meal planning includes avoidance of concentrated sweets. She has not had a previous visit with a dietician. She never participates in exercise. There is no change in her home blood glucose trend. Her breakfast blood glucose range is generally 130-140 mg/dl. Her lunch blood glucose range is generally 130-140 mg/dl. Her dinner blood glucose range is generally 140-180 mg/dl. Her highest blood glucose is 180-200 mg/dl. Her overall blood glucose range is 140-180 mg/dl. An ACE inhibitor/angiotensin II receptor blocker is being taken. She does not see a podiatrist.Eye exam is current.  Hypertension This is a chronic problem. The current episode started more than 1 year ago. The problem has been gradually improving since onset. The problem is controlled. Pertinent negatives include no anxiety, blurred vision, chest pain, headaches, malaise/fatigue, neck pain, orthopnea, palpitations, peripheral edema, PND, shortness of breath or sweats. Past treatments include angiotensin blockers and diuretics. Compliance  problems include medication side effects ("upset stomach" from benicar).       Review of Systems  Constitutional: Negative for fever, chills, weight loss, malaise/fatigue, diaphoresis, activity change, appetite change, fatigue and unexpected weight change.  HENT: Negative for facial swelling, neck pain and neck stiffness.   Eyes: Negative.  Negative for blurred vision.  Respiratory: Negative for apnea, cough, choking, chest tightness, shortness of breath, wheezing and stridor.   Cardiovascular: Negative for chest pain, palpitations, orthopnea, leg swelling and PND.  Gastrointestinal: Negative for nausea, vomiting, abdominal pain, diarrhea, constipation and anal bleeding.  Genitourinary: Positive for dysuria, urgency and frequency. Negative for polyuria, hematuria, flank pain, decreased urine volume, enuresis, difficulty urinating and dyspareunia.  Musculoskeletal: Negative for myalgias, back pain, joint swelling, arthralgias and gait problem.  Skin: Negative for color change, pallor, rash and wound.  Neurological: Negative for dizziness, tremors, seizures, syncope, facial asymmetry, speech difficulty, weakness, light-headedness, numbness and headaches.  Hematological: Negative for polydipsia, polyphagia and adenopathy. Does not bruise/bleed easily.  Psychiatric/Behavioral: Negative.        Objective:   Physical Exam  Vitals reviewed. Constitutional: She is oriented to person, place, and time. She appears well-developed and well-nourished. No distress.  HENT:  Head: Normocephalic and atraumatic.  Mouth/Throat: Oropharynx is clear and moist. No oropharyngeal exudate.  Eyes: Conjunctivae are normal. Right eye exhibits no discharge. Left eye exhibits no discharge. No scleral icterus.  Neck: Normal range of motion. Neck supple. No JVD present. No tracheal deviation present. No thyromegaly present.  Cardiovascular: Normal rate, regular rhythm, normal heart sounds and intact distal pulses.   Exam reveals no gallop and no friction rub.   No murmur heard. Pulmonary/Chest: Effort normal and breath sounds normal. No stridor. No respiratory distress. She has no wheezes. She has no  rales. She exhibits no tenderness.  Abdominal: Soft. Bowel sounds are normal. She exhibits no distension and no mass. There is no tenderness. There is no rebound and no guarding.  Musculoskeletal: Normal range of motion. She exhibits no edema and no tenderness.  Lymphadenopathy:    She has no cervical adenopathy.  Neurological: She is oriented to person, place, and time.  Skin: Skin is warm and dry. No rash noted. She is not diaphoretic. No erythema. No pallor.  Psychiatric: She has a normal mood and affect. Her behavior is normal. Judgment and thought content normal.      Lab Results  Component Value Date   WBC 9.2 12/05/2010   HGB 15.3* 12/05/2010   HCT 44.4 12/05/2010   PLT 273.0 12/05/2010   GLUCOSE 186* 12/05/2010   CHOL 197 06/07/2010   TRIG 306.0* 06/07/2010   HDL 40.70 06/07/2010   LDLDIRECT 114.7 06/07/2010   LDLCALC 98.8 02/02/2010   ALT 37* 12/05/2010   AST 28 12/05/2010   NA 140 12/05/2010   K 3.6 12/05/2010   CL 100 12/05/2010   CREATININE 0.6 12/05/2010   BUN 13 12/05/2010   CO2 28 12/05/2010   TSH 2.11 12/05/2010   HGBA1C 8.6* 12/05/2010   MICROALBUR 2.6* 06/20/2009      Assessment & Plan:

## 2011-01-29 NOTE — Assessment & Plan Note (Signed)
Recheck her FLP today

## 2011-01-30 ENCOUNTER — Encounter: Payer: Self-pay | Admitting: Internal Medicine

## 2011-02-01 ENCOUNTER — Encounter: Payer: Self-pay | Admitting: Internal Medicine

## 2011-02-01 ENCOUNTER — Telehealth: Payer: Self-pay | Admitting: *Deleted

## 2011-02-01 LAB — CULTURE, URINE COMPREHENSIVE

## 2011-02-01 NOTE — Telephone Encounter (Signed)
Another letter was sent today about low potassium level, high blood sugar, and more urine infection

## 2011-02-01 NOTE — Telephone Encounter (Signed)
Pt received results of cholesterol from letter but she did not receive any additional information on what she should do regarding her labs-she is asking for MD's advisement regarding her lab results

## 2011-02-02 ENCOUNTER — Other Ambulatory Visit: Payer: Self-pay | Admitting: Internal Medicine

## 2011-02-02 DIAGNOSIS — N390 Urinary tract infection, site not specified: Secondary | ICD-10-CM

## 2011-02-02 MED ORDER — NITROFURANTOIN MACROCRYSTAL 100 MG PO CAPS: 100.0000 mg | ORAL_CAPSULE | Freq: Four times a day (QID) | ORAL | Status: AC

## 2011-02-05 ENCOUNTER — Telehealth: Payer: Self-pay

## 2011-02-05 DIAGNOSIS — I10 Essential (primary) hypertension: Secondary | ICD-10-CM

## 2011-02-05 DIAGNOSIS — E119 Type 2 diabetes mellitus without complications: Secondary | ICD-10-CM

## 2011-02-05 MED ORDER — PAROXETINE HCL ER 25 MG PO TB24: ORAL_TABLET | ORAL | Status: DC

## 2011-02-05 MED ORDER — LEVOTHYROXINE SODIUM 75 MCG PO TABS: ORAL_TABLET | ORAL | Status: DC

## 2011-02-05 MED ORDER — VALSARTAN-HYDROCHLOROTHIAZIDE 160-12.5 MG PO TABS: 1.0000 | ORAL_TABLET | Freq: Every day | ORAL | Status: DC

## 2011-02-05 MED ORDER — ZOLPIDEM TARTRATE 10 MG PO TABS: ORAL_TABLET | ORAL | Status: DC

## 2011-02-05 MED ORDER — ALPRAZOLAM 0.25 MG PO TABS: ORAL_TABLET | ORAL | Status: DC

## 2011-02-05 NOTE — Telephone Encounter (Signed)
Med refill per pt/ approved per MD

## 2011-02-21 ENCOUNTER — Ambulatory Visit: Payer: Self-pay | Admitting: Obstetrics and Gynecology

## 2011-03-01 ENCOUNTER — Other Ambulatory Visit: Payer: Managed Care, Other (non HMO)

## 2011-03-01 ENCOUNTER — Encounter: Payer: Self-pay | Admitting: Internal Medicine

## 2011-03-01 ENCOUNTER — Ambulatory Visit (INDEPENDENT_AMBULATORY_CARE_PROVIDER_SITE_OTHER): Payer: Managed Care, Other (non HMO) | Admitting: Internal Medicine

## 2011-03-01 DIAGNOSIS — E876 Hypokalemia: Secondary | ICD-10-CM

## 2011-03-01 DIAGNOSIS — E039 Hypothyroidism, unspecified: Secondary | ICD-10-CM

## 2011-03-01 DIAGNOSIS — E785 Hyperlipidemia, unspecified: Secondary | ICD-10-CM

## 2011-03-01 DIAGNOSIS — E119 Type 2 diabetes mellitus without complications: Secondary | ICD-10-CM

## 2011-03-01 DIAGNOSIS — N390 Urinary tract infection, site not specified: Secondary | ICD-10-CM

## 2011-03-01 DIAGNOSIS — E782 Mixed hyperlipidemia: Secondary | ICD-10-CM

## 2011-03-01 LAB — POCT URINALYSIS DIPSTICK
Blood, UA: NEGATIVE
Nitrite, UA: POSITIVE
pH, UA: 5

## 2011-03-01 MED ORDER — SITAGLIPTIN PHOS-METFORMIN HCL 50-1000 MG PO TABS: 1.0000 | ORAL_TABLET | Freq: Two times a day (BID) | ORAL | Status: DC

## 2011-03-01 MED ORDER — ZOLPIDEM TARTRATE 10 MG PO TABS: ORAL_TABLET | ORAL | Status: DC

## 2011-03-01 MED ORDER — POTASSIUM CHLORIDE CRYS ER 20 MEQ PO TBCR: 20.0000 meq | EXTENDED_RELEASE_TABLET | Freq: Every day | ORAL | Status: DC

## 2011-03-01 MED ORDER — NITROFURANTOIN MACROCRYSTAL 100 MG PO CAPS: 100.0000 mg | ORAL_CAPSULE | Freq: Four times a day (QID) | ORAL | Status: AC

## 2011-03-01 MED ORDER — FENOFIBRATE 150 MG PO CAPS: 1.0000 | ORAL_CAPSULE | Freq: Every day | ORAL | Status: DC

## 2011-03-01 NOTE — Patient Instructions (Signed)
Diabetes, Type 2 Diabetes is a long-lasting (chronic) disease. In type 2 diabetes, the pancreas does not make enough insulin (a hormone), and the body does not respond normally to the insulin that is made. This type of diabetes was also previously called adult-onset diabetes. It usually occurs after the age of 58, but it can occur at any age.  CAUSES  Type 2 diabetes happens because the pancreasis not making enough insulin or your body has trouble using the insulin that your pancreas does make properly. SYMPTOMS   Drinking more than usual.   Urinating more than usual.   Blurred vision.   Dry, itchy skin.   Frequent infections.   Feeling more tired than usual (fatigue).  DIAGNOSIS The diagnosis of type 2 diabetes is usually made by one of the following tests:  Fasting blood glucose test. You will not eat for at least 8 hours and then take a blood test.   Random blood glucose test. Your blood glucose (sugar) is checked at any time of the day regardless of when you ate.   Oral glucose tolerance test (OGTT). Your blood glucose is measured after you have not eaten (fasted) and then after you drink a glucose containing beverage.  TREATMENT   Healthy eating.   Exercise.   Medicine, if needed.   Monitoring blood glucose.   Seeing your caregiver regularly.  HOME CARE INSTRUCTIONS   Check your blood glucose at least once a day. More frequent monitoring may be necessary, depending on your medicines and on how well your diabetes is controlled. Your caregiver will advise you.   Take your medicine as directed by your caregiver.   Do not smoke.   Make wise food choices. Ask your caregiver for information. Weight loss can improve your diabetes.   Learn about low blood glucose (hypoglycemia) and how to treat it.   Get your eyes checked regularly.   Have a yearly physical exam. Have your blood pressure checked and your blood and urine tested.   Wear a pendant or bracelet saying  that you have diabetes.   Check your feet every night for cuts, sores, blisters, and redness. Let your caregiver know if you have any problems.  SEEK MEDICAL CARE IF:   You have problems keeping your blood glucose in target range.   You have problems with your medicines.   You have symptoms of an illness that do not improve after 24 hours.   You have a sore or wound that is not healing.   You notice a change in vision or a new problem with your vision.   You have a fever.  MAKE SURE YOU:  Understand these instructions.   Will watch your condition.   Will get help right away if you are not doing well or get worse.  Document Released: 01/29/2005 Document Revised: 10/12/2010 Document Reviewed: 07/17/2010 [facility] Patient Information 2012 [place].Urinary Tract Infection Infections of the urinary tract can start in several places. A bladder infection (cystitis), a kidney infection (pyelonephritis), and a prostate infection (prostatitis) are different types of urinary tract infections (UTIs). They usually get better if treated with medicines (antibiotics) that kill germs. Take all the medicine until it is gone. You or your child may feel better in a few days, but TAKE ALL MEDICINE or the infection may not respond and may become more difficult to treat. HOME CARE INSTRUCTIONS   Drink enough water and fluids to keep the urine clear or pale yellow. Cranberry juice is especially recommended,  in addition to large amounts of water.   Avoid caffeine, tea, and carbonated beverages. They tend to irritate the bladder.   Alcohol may irritate the prostate.   Only take over-the-counter or prescription medicines for pain, discomfort, or fever as directed by your caregiver.  To prevent further infections:  Empty the bladder often. Avoid holding urine for long periods of time.   After a bowel movement, women should cleanse from front to back. Use each tissue only once.   Empty the bladder  before and after sexual intercourse.  FINDING OUT THE RESULTS OF YOUR TEST Not all test results are available during your visit. If your or your child's test results are not back during the visit, make an appointment with your caregiver to find out the results. Do not assume everything is normal if you have not heard from your caregiver or the medical facility. It is important for you to follow up on all test results. SEEK MEDICAL CARE IF:   There is back pain.   Your baby is older than 3 months with a rectal temperature of 100.5 F (38.1 C) or higher for more than 1 day.   Your or your child's problems (symptoms) are no better in 3 days. Return sooner if you or your child is getting worse.  SEEK IMMEDIATE MEDICAL CARE IF:   There is severe back pain or lower abdominal pain.   You or your child develops chills.   You have a fever.   Your baby is older than 3 months with a rectal temperature of 102 F (38.9 C) or higher.   Your baby is 57 months old or younger with a rectal temperature of 100.4 F (38 C) or higher.   There is nausea or vomiting.   There is continued burning or discomfort with urination.  MAKE SURE YOU:   Understand these instructions.   Will watch your condition.   Will get help right away if you are not doing well or get worse.  Document Released: 11/08/2004 Document Revised: 10/11/2010 Document Reviewed: 06/13/2006 [facility] Patient Information 2012 [place].

## 2011-03-01 NOTE — Assessment & Plan Note (Signed)
Start K+ replacement

## 2011-03-01 NOTE — Assessment & Plan Note (Signed)
Start lipofen for the high trigs

## 2011-03-01 NOTE — Assessment & Plan Note (Signed)
Recent TSH was normal

## 2011-03-01 NOTE — Assessment & Plan Note (Signed)
Blood sugar is not well controlled so I have upgraded her oral regimen to [id number]

## 2011-03-01 NOTE — Progress Notes (Signed)
Subjective:    Patient ID: Kirsten [name], female    DOB: Sep 16, 1955, 56 y.o.   MRN: [id number]  Dysuria  This is a recurrent problem. The current episode started in the past 7 days. The problem occurs every urination. The problem has been gradually worsening. The quality of the pain is described as burning. The pain is at a severity of 1/10. The pain is mild. There has been no fever. She is sexually active. There is a history of pyelonephritis. Associated symptoms include nausea and urgency. Pertinent negatives include no chills, discharge, flank pain, frequency, hematuria, hesitancy, possible pregnancy, sweats or vomiting. She has tried antibiotics for the symptoms. The treatment provided mild relief. Her past medical history is significant for recurrent UTIs.  Diabetes She presents for her follow-up diabetic visit. She has type 2 diabetes mellitus. Her disease course has been worsening. There are no hypoglycemic associated symptoms. Pertinent negatives for hypoglycemia include no dizziness, headaches, pallor, seizures, speech difficulty, sweats or tremors. Associated symptoms include polyuria. Pertinent negatives for diabetes include no blurred vision, no chest pain, no fatigue, no foot paresthesias, no foot ulcerations, no polydipsia, no polyphagia, no visual change, no weakness and no weight loss. There are no hypoglycemic complications. Symptoms are stable. There are no diabetic complications. Current diabetic treatment includes intensive insulin program and oral agent (monotherapy). She is compliant with treatment all of the time. Her weight is stable. She is following a generally healthy diet. Meal planning includes avoidance of concentrated sweets. She has not had a previous visit with a dietician. She never participates in exercise. There is no change in her home blood glucose trend. Her breakfast blood glucose range is generally 140-180 mg/dl. Her lunch blood glucose range is generally 140-180  mg/dl. Her dinner blood glucose range is generally 180-200 mg/dl. Her highest blood glucose is 180-200 mg/dl. Her overall blood glucose range is 140-180 mg/dl. An ACE inhibitor/angiotensin II receptor blocker is being taken. She does not see a podiatrist.Eye exam is current.  Hyperlipidemia This is a chronic problem. The current episode started more than 1 year ago. The problem is resistant. Recent lipid tests were reviewed and are variable. Exacerbating diseases include diabetes and obesity. She has no history of chronic renal disease, hypothyroidism, liver disease or nephrotic syndrome. Factors aggravating her hyperlipidemia include fatty foods. Pertinent negatives include no chest pain, focal sensory loss, focal weakness, leg pain, myalgias or shortness of breath. Current antihyperlipidemic treatment includes statins. The current treatment provides mild improvement of lipids. Compliance problems include adherence to exercise and adherence to diet.       Review of Systems  Constitutional: Negative for fever, chills, weight loss, diaphoresis, activity change, appetite change, fatigue and unexpected weight change.  HENT: Negative.   Eyes: Negative.  Negative for blurred vision.  Respiratory: Negative for cough, chest tightness, shortness of breath, wheezing and stridor.   Cardiovascular: Negative for chest pain, palpitations and leg swelling.  Gastrointestinal: Positive for nausea. Negative for vomiting, abdominal pain, diarrhea, constipation, blood in stool, abdominal distention, anal bleeding and rectal pain.  Genitourinary: Positive for dysuria, urgency and polyuria. Negative for hesitancy, frequency, hematuria, flank pain, decreased urine volume, enuresis, difficulty urinating, pelvic pain and dyspareunia.  Musculoskeletal: Negative for myalgias, back pain, joint swelling, arthralgias and gait problem.  Skin: Negative for color change, pallor, rash and wound.  Neurological: Negative for  dizziness, tremors, focal weakness, seizures, syncope, facial asymmetry, speech difficulty, weakness, light-headedness, numbness and headaches.  Hematological: Negative for polydipsia, polyphagia and adenopathy.  Does not bruise/bleed easily.  Psychiatric/Behavioral: Negative.        Objective:   Physical Exam  Vitals reviewed. Constitutional: She is oriented to person, place, and time. She appears well-developed and well-nourished. No distress.  HENT:  Head: Normocephalic and atraumatic.  Mouth/Throat: Oropharynx is clear and moist. No oropharyngeal exudate.  Eyes: Conjunctivae are normal. Right eye exhibits no discharge. Left eye exhibits no discharge. No scleral icterus.  Neck: Normal range of motion. Neck supple. No JVD present. No tracheal deviation present. No thyromegaly present.  Cardiovascular: Normal rate, regular rhythm, normal heart sounds and intact distal pulses.  Exam reveals no gallop and no friction rub.   No murmur heard. Pulmonary/Chest: Effort normal and breath sounds normal. No stridor. No respiratory distress. She has no wheezes. She has no rales. She exhibits no tenderness.  Abdominal: Soft. Bowel sounds are normal. She exhibits no distension. There is no hepatosplenomegaly. There is no tenderness. There is no rebound, no guarding and no CVA tenderness.  Musculoskeletal: Normal range of motion. She exhibits no edema and no tenderness.  Lymphadenopathy:    She has no cervical adenopathy.  Neurological: She is oriented to person, place, and time.  Skin: Skin is warm and dry. No rash noted. She is not diaphoretic. No erythema. No pallor.  Psychiatric: She has a normal mood and affect. Her behavior is normal. Judgment and thought content normal.      Lab Results  Component Value Date   WBC 9.2 12/05/2010   HGB 15.3* 12/05/2010   HCT 44.4 12/05/2010   PLT 273.0 12/05/2010   GLUCOSE 131* 01/29/2011   CHOL 225* 01/29/2011   TRIG 288.0* 01/29/2011   HDL 43.90  01/29/2011   LDLDIRECT 140.4 01/29/2011   LDLCALC 98.8 02/02/2010   ALT 36* 01/29/2011   AST 23 01/29/2011   NA 141 01/29/2011   K 3.3* 01/29/2011   CL 99 01/29/2011   CREATININE 0.6 01/29/2011   BUN 14 01/29/2011   CO2 29 01/29/2011   TSH 1.53 01/29/2011   HGBA1C 7.5* 01/29/2011   MICROALBUR 2.6* 06/20/2009      Assessment & Plan:

## 2011-03-01 NOTE — Assessment & Plan Note (Signed)
Start macrobid and recheck the urine clx

## 2011-03-22 ENCOUNTER — Other Ambulatory Visit (INDEPENDENT_AMBULATORY_CARE_PROVIDER_SITE_OTHER): Payer: Managed Care, Other (non HMO)

## 2011-03-22 ENCOUNTER — Encounter: Payer: Self-pay | Admitting: Internal Medicine

## 2011-03-22 ENCOUNTER — Ambulatory Visit (INDEPENDENT_AMBULATORY_CARE_PROVIDER_SITE_OTHER): Payer: Managed Care, Other (non HMO) | Admitting: Internal Medicine

## 2011-03-22 DIAGNOSIS — E119 Type 2 diabetes mellitus without complications: Secondary | ICD-10-CM

## 2011-03-22 DIAGNOSIS — E039 Hypothyroidism, unspecified: Secondary | ICD-10-CM

## 2011-03-22 DIAGNOSIS — E785 Hyperlipidemia, unspecified: Secondary | ICD-10-CM

## 2011-03-22 DIAGNOSIS — N390 Urinary tract infection, site not specified: Secondary | ICD-10-CM

## 2011-03-22 DIAGNOSIS — I10 Essential (primary) hypertension: Secondary | ICD-10-CM

## 2011-03-22 DIAGNOSIS — E782 Mixed hyperlipidemia: Secondary | ICD-10-CM

## 2011-03-22 DIAGNOSIS — E876 Hypokalemia: Secondary | ICD-10-CM

## 2011-03-22 LAB — LDL CHOLESTEROL, DIRECT: Direct LDL: 122 mg/dL

## 2011-03-22 LAB — BASIC METABOLIC PANEL
BUN: 14 mg/dL (ref 6–23)
CO2: 28 mEq/L (ref 19–32)
Chloride: 105 mEq/L (ref 96–112)
Creatinine, Ser: 0.5 mg/dL (ref 0.4–1.2)

## 2011-03-22 LAB — URINALYSIS, ROUTINE W REFLEX MICROSCOPIC
Bilirubin Urine: NEGATIVE
Ketones, ur: NEGATIVE
Total Protein, Urine: NEGATIVE
pH: 5.5 (ref 5.0–8.0)

## 2011-03-22 LAB — LIPID PANEL
Cholesterol: 197 mg/dL (ref 0–200)
VLDL: 50 mg/dL — ABNORMAL HIGH (ref 0.0–40.0)

## 2011-03-22 NOTE — Patient Instructions (Signed)

## 2011-03-22 NOTE — Assessment & Plan Note (Signed)
She is doing well on her current regimen, will check her FLP today

## 2011-03-22 NOTE — Assessment & Plan Note (Signed)
Recent TSH looks good 

## 2011-03-22 NOTE — Assessment & Plan Note (Signed)
Check her FLP today

## 2011-03-22 NOTE — Assessment & Plan Note (Signed)
Recheck her K+ today and check her Mg++ level as well

## 2011-03-22 NOTE — Assessment & Plan Note (Signed)
Check her UA and urine clx today

## 2011-03-22 NOTE — Assessment & Plan Note (Signed)
Recheck her a1c today

## 2011-03-22 NOTE — Progress Notes (Signed)
Subjective:    Patient ID: Kirsten [name], female    DOB: Oct 26, 1955, 56 y.o.   MRN: [id number]  Diabetes She presents for her follow-up diabetic visit. She has type 2 diabetes mellitus. Her disease course has been stable. There are no hypoglycemic associated symptoms. Pertinent negatives for diabetes include no blurred vision, no chest pain, no fatigue, no foot paresthesias, no foot ulcerations, no polydipsia, no polyphagia, no polyuria, no visual change, no weakness and no weight loss. There are no hypoglycemic complications. Symptoms are stable. There are no diabetic complications. Current diabetic treatment includes oral agent (dual therapy). She is compliant with treatment all of the time. Her weight is stable. She is following a generally healthy diet. Meal planning includes avoidance of concentrated sweets. She has not had a previous visit with a dietician. She participates in exercise intermittently. There is no change in her home blood glucose trend. An ACE inhibitor/angiotensin II receptor blocker is being taken. She does not see a podiatrist.Eye exam is current.  Hyperlipidemia This is a chronic problem. The current episode started more than 1 year ago. The problem is controlled. Recent lipid tests were reviewed and are variable. Exacerbating diseases include diabetes and obesity. She has no history of chronic renal disease, hypothyroidism, liver disease or nephrotic syndrome. Factors aggravating her hyperlipidemia include fatty foods. Pertinent negatives include no chest pain, focal sensory loss, focal weakness, leg pain, myalgias or shortness of breath. Current antihyperlipidemic treatment includes fibric acid derivatives and statins. The current treatment provides moderate improvement of lipids. Compliance problems include adherence to exercise and adherence to diet.       Review of Systems  Constitutional: Negative.  Negative for fever, weight loss and fatigue.  HENT: Negative.     Eyes: Negative.  Negative for blurred vision.  Respiratory: Negative.  Negative for shortness of breath.   Cardiovascular: Negative.  Negative for chest pain.  Gastrointestinal: Negative.   Genitourinary: Negative for dysuria, urgency, polyuria, frequency, hematuria, flank pain, decreased urine volume, enuresis, difficulty urinating and dyspareunia.  Musculoskeletal: Negative for myalgias, back pain, joint swelling, arthralgias and gait problem.  Skin: Negative.   Neurological: Negative.  Negative for focal weakness and weakness.  Hematological: Negative.  Negative for polydipsia, polyphagia and adenopathy. Does not bruise/bleed easily.  Psychiatric/Behavioral: Negative.        Objective:   Physical Exam  Vitals reviewed. Constitutional: She is oriented to person, place, and time. She appears well-developed and well-nourished. No distress.  HENT:  Head: Normocephalic and atraumatic.  Mouth/Throat: Oropharynx is clear and moist. No oropharyngeal exudate.  Eyes: Conjunctivae are normal. Right eye exhibits no discharge. Left eye exhibits no discharge. No scleral icterus.  Neck: Normal range of motion. Neck supple. No JVD present. No tracheal deviation present. No thyromegaly present.  Cardiovascular: Normal rate, regular rhythm, normal heart sounds and intact distal pulses.  Exam reveals no gallop and no friction rub.   No murmur heard. Pulmonary/Chest: Effort normal and breath sounds normal. No stridor. No respiratory distress. She has no wheezes. She has no rales. She exhibits no tenderness.  Abdominal: Soft. Bowel sounds are normal. She exhibits no distension and no mass. There is no tenderness. There is no rebound and no guarding.  Musculoskeletal: Normal range of motion. She exhibits no edema.  Lymphadenopathy:    She has no cervical adenopathy.  Neurological: She is oriented to person, place, and time.  Skin: Skin is warm and dry. No rash noted. She is not diaphoretic. No erythema.  No  pallor.  Psychiatric: She has a normal mood and affect. Her behavior is normal. Judgment and thought content normal.     Lab Results  Component Value Date   WBC 9.2 12/05/2010   HGB 15.3* 12/05/2010   HCT 44.4 12/05/2010   PLT 273.0 12/05/2010   GLUCOSE 131* 01/29/2011   CHOL 225* 01/29/2011   TRIG 288.0* 01/29/2011   HDL 43.90 01/29/2011   LDLDIRECT 140.4 01/29/2011   LDLCALC 98.8 02/02/2010   ALT 36* 01/29/2011   AST 23 01/29/2011   NA 141 01/29/2011   K 3.3* 01/29/2011   CL 99 01/29/2011   CREATININE 0.6 01/29/2011   BUN 14 01/29/2011   CO2 29 01/29/2011   TSH 1.53 01/29/2011   HGBA1C 7.5* 01/29/2011   MICROALBUR 2.6* 06/20/2009       Assessment & Plan:

## 2011-03-25 LAB — CULTURE, URINE COMPREHENSIVE: Colony Count: 9000

## 2011-03-27 ENCOUNTER — Telehealth: Payer: Self-pay

## 2011-03-27 MED ORDER — ALPRAZOLAM 0.25 MG PO TABS: ORAL_TABLET | ORAL | Status: DC

## 2011-03-27 NOTE — Telephone Encounter (Signed)
RX called in, letter mailed per system

## 2011-03-27 NOTE — Telephone Encounter (Signed)
Yes and yes 

## 2011-03-27 NOTE — Telephone Encounter (Signed)
Pt called requesting refill of Ambien and a copy of her last labs mailed to her home. I see a letter has already been sent.

## 2011-04-06 ENCOUNTER — Ambulatory Visit (INDEPENDENT_AMBULATORY_CARE_PROVIDER_SITE_OTHER): Payer: Managed Care, Other (non HMO) | Admitting: Internal Medicine

## 2011-04-06 ENCOUNTER — Encounter: Payer: Self-pay | Admitting: Internal Medicine

## 2011-04-06 ENCOUNTER — Other Ambulatory Visit: Payer: Managed Care, Other (non HMO)

## 2011-04-06 DIAGNOSIS — N390 Urinary tract infection, site not specified: Secondary | ICD-10-CM

## 2011-04-06 DIAGNOSIS — J45901 Unspecified asthma with (acute) exacerbation: Secondary | ICD-10-CM

## 2011-04-06 DIAGNOSIS — E538 Deficiency of other specified B group vitamins: Secondary | ICD-10-CM

## 2011-04-06 DIAGNOSIS — J019 Acute sinusitis, unspecified: Secondary | ICD-10-CM | POA: Insufficient documentation

## 2011-04-06 DIAGNOSIS — J4530 Mild persistent asthma, uncomplicated: Secondary | ICD-10-CM | POA: Insufficient documentation

## 2011-04-06 MED ORDER — LEVOFLOXACIN 500 MG PO TABS: 500.0000 mg | ORAL_TABLET | Freq: Every day | ORAL | Status: AC

## 2011-04-06 MED ORDER — PSEUDOEPH-HYDROCODONE 60-5 MG/5ML PO SOLN: 5.0000 mL | Freq: Four times a day (QID) | ORAL | Status: DC | PRN

## 2011-04-06 MED ORDER — CYANOCOBALAMIN 1000 MCG/ML IJ SOLN
1000.0000 ug | Freq: Once | INTRAMUSCULAR | Status: AC
  Administered 2011-04-06: 1000 ug via INTRAMUSCULAR

## 2011-04-06 MED ORDER — ALBUTEROL SULFATE HFA 108 (90 BASE) MCG/ACT IN AERS: 2.0000 | INHALATION_SPRAY | Freq: Four times a day (QID) | RESPIRATORY_TRACT | Status: DC | PRN

## 2011-04-06 MED ORDER — PSEUDOEPHEDRINE-CODEINE-GG 30-10-100 MG/5ML PO SOLN: 10.0000 mL | Freq: Four times a day (QID) | ORAL | Status: DC | PRN

## 2011-04-06 MED ORDER — METHYLPREDNISOLONE ACETATE 80 MG/ML IJ SUSP
120.0000 mg | Freq: Once | INTRAMUSCULAR | Status: AC
  Administered 2011-04-06: 120 mg via INTRAMUSCULAR

## 2011-04-06 MED ORDER — MOMETASONE FURO-FORMOTEROL FUM 200-5 MCG/ACT IN AERO: 2.0000 | INHALATION_SPRAY | Freq: Two times a day (BID) | RESPIRATORY_TRACT | Status: DC

## 2011-04-06 NOTE — Progress Notes (Signed)
Subjective:    Patient ID: Kirsten [name], female    DOB: 05/21/1955, 55 y.o.   MRN: [id number]  Cough Associated symptoms include chills, nasal congestion, rhinorrhea, a sore throat, shortness of breath and wheezing. Pertinent negatives include no chest pain, ear congestion, ear pain, fever, headaches, heartburn, hemoptysis, myalgias, postnasal drip, rash, sweats or weight loss. The symptoms are aggravated by nothing. Her past medical history is significant for asthma, bronchitis and pneumonia.      Review of Systems  Constitutional: Positive for chills. Negative for fever, weight loss, diaphoresis, activity change, appetite change, fatigue and unexpected weight change.  HENT: Positive for congestion, sore throat, rhinorrhea, sneezing and sinus pressure. Negative for hearing loss, ear pain, nosebleeds, facial swelling, drooling, mouth sores, trouble swallowing, neck pain, neck stiffness, dental problem, voice change, postnasal drip, tinnitus and ear discharge.   Eyes: Negative.   Respiratory: Positive for cough, shortness of breath and wheezing. Negative for apnea, hemoptysis, choking, chest tightness and stridor.   Cardiovascular: Negative for chest pain, palpitations and leg swelling.  Gastrointestinal: Negative for heartburn, nausea, vomiting, abdominal pain, diarrhea, constipation and abdominal distention.  Genitourinary: Negative for dysuria, urgency, frequency, hematuria, flank pain, decreased urine volume, enuresis, difficulty urinating and dyspareunia.  Musculoskeletal: Negative for myalgias, back pain, joint swelling, arthralgias and gait problem.  Skin: Negative for color change, pallor, rash and wound.  Neurological: Negative for dizziness, tremors, seizures, syncope, facial asymmetry, speech difficulty, weakness, light-headedness, numbness and headaches.  Hematological: Negative for adenopathy. Does not bruise/bleed easily.  Psychiatric/Behavioral: Negative.        Objective:    Physical Exam  Vitals reviewed. Constitutional: She is oriented to person, place, and time. She appears well-developed and well-nourished. No distress.  HENT:  Head: No trismus in the jaw.  Right Ear: Hearing, tympanic membrane, external ear and ear canal normal.  Left Ear: Hearing, tympanic membrane, external ear and ear canal normal.  Nose: Mucosal edema and rhinorrhea present. No nose lacerations, sinus tenderness, nasal deformity, septal deviation or nasal septal hematoma. No epistaxis.  No foreign bodies. Right sinus exhibits maxillary sinus tenderness. Right sinus exhibits no frontal sinus tenderness. Left sinus exhibits maxillary sinus tenderness. Left sinus exhibits no frontal sinus tenderness.  Mouth/Throat: Oropharynx is clear and moist and mucous membranes are normal. Mucous membranes are not pale, not dry and not cyanotic. No uvula swelling. No oropharyngeal exudate, posterior oropharyngeal edema, posterior oropharyngeal erythema or tonsillar abscesses.  Eyes: Conjunctivae are normal. Right eye exhibits no discharge. Left eye exhibits no discharge. No scleral icterus.  Neck: Normal range of motion. Neck supple. No JVD present. No tracheal deviation present. No thyromegaly present.  Cardiovascular: Normal rate, regular rhythm, normal heart sounds and intact distal pulses.  Exam reveals no gallop and no friction rub.   No murmur heard. Pulmonary/Chest: Effort normal. No accessory muscle usage or stridor. Not tachypneic. No respiratory distress. She has no decreased breath sounds. She has wheezes in the right middle field and the left middle field. She has no rhonchi. She has no rales. Chest wall is not dull to percussion. She exhibits no mass, no tenderness, no deformity and no swelling.  Abdominal: Soft. Bowel sounds are normal. She exhibits no distension and no mass. There is no tenderness. There is no rebound and no guarding.  Musculoskeletal: Normal range of motion. She exhibits no  edema and no tenderness.  Lymphadenopathy:    She has no cervical adenopathy.  Neurological: She is oriented to person, place, and time.  Skin: Skin is warm and dry. No rash noted. She is not diaphoretic. No erythema. No pallor.  Psychiatric: She has a normal mood and affect. Her behavior is normal. Judgment and thought content normal.     Lab Results  Component Value Date   WBC 9.2 12/05/2010   HGB 15.3* 12/05/2010   HCT 44.4 12/05/2010   PLT 273.0 12/05/2010   GLUCOSE 92 03/22/2011   CHOL 197 03/22/2011   TRIG 250.0* 03/22/2011   HDL 40.40 03/22/2011   LDLDIRECT 122.0 03/22/2011   LDLCALC 98.8 02/02/2010   ALT 36* 01/29/2011   AST 23 01/29/2011   NA 140 03/22/2011   K 4.1 03/22/2011   CL 105 03/22/2011   CREATININE 0.5 03/22/2011   BUN 14 03/22/2011   CO2 28 03/22/2011   TSH 1.53 01/29/2011   HGBA1C 7.0* 03/22/2011   MICROALBUR 2.6* 06/20/2009       Assessment & Plan:

## 2011-04-06 NOTE — Patient Instructions (Signed)
Sinusitis Sinuses are air pockets within the bones of your face. The growth of bacteria within a sinus leads to infection. The infection prevents the sinuses from draining. This infection is called sinusitis. SYMPTOMS  There will be different areas of pain depending on which sinuses have become infected.  The maxillary sinuses often produce pain beneath the eyes.   Frontal sinusitis may cause pain in the middle of the forehead and above the eyes.  Other problems (symptoms) include:  Toothaches.   Colored, pus-like (purulent) drainage from the nose.   Swelling, warmth, and tenderness over the sinus areas may be signs of infection.  TREATMENT  Sinusitis is most often determined by an exam.X-rays may be taken. If x-rays have been taken, make sure you obtain your results or find out how you are to obtain them. Your caregiver may give you medications (antibiotics). These are medications that will help kill the bacteria causing the infection. You may also be given a medication (decongestant) that helps to reduce sinus swelling.  HOME CARE INSTRUCTIONS   Only take over-the-counter or prescription medicines for pain, discomfort, or fever as directed by your caregiver.   Drink extra fluids. Fluids help thin the mucus so your sinuses can drain more easily.   Applying either moist heat or ice packs to the sinus areas may help relieve discomfort.   Use saline nasal sprays to help moisten your sinuses. The sprays can be found at your local drugstore.  SEEK IMMEDIATE MEDICAL CARE IF:  You have a fever.   You have increasing pain, severe headaches, or toothache.   You have nausea, vomiting, or drowsiness.   You develop unusual swelling around the face or trouble seeing.  MAKE SURE YOU:   Understand these instructions.   Will watch your condition.   Will get help right away if you are not doing well or get worse.  Document Released: 01/29/2005 Document Revised: 10/11/2010 Document Reviewed:  08/28/2006 [facility] Patient Information 2012 [place].Asthma, Adult Asthma is caused by narrowing of the air passages in the lungs. It may be triggered by pollen, dust, animal dander, molds, some foods, respiratory infections, exposure to smoke, exercise, emotional stress or other allergens (things that cause allergic reactions or allergies). Repeat attacks are common. HOME CARE INSTRUCTIONS   Use prescription medications as ordered by your caregiver.   Avoid pollen, dust, animal dander, molds, smoke and other things that cause attacks at home and at work.   You may have fewer attacks if you decrease dust in your home. Electrostatic air cleaners may help.   It may help to replace your pillows or mattress with materials less likely to cause allergies.   Talk to your caregiver about an action plan for managing asthma attacks at home, including, the use of a peak flow meter which measures the severity of your asthma attack. An action plan can help minimize or stop the attack without having to seek medical care.   If you are not on a fluid restriction, drink 8 to 10 glasses of water each day.   Always have a plan prepared for seeking medical attention, including, calling your physician, accessing local emergency care, and calling 911 (in the [place] for a severe attack.   Discuss possible exercise routines with your caregiver.   If animal dander is the cause of asthma, you may need to get rid of pets.  SEEK MEDICAL CARE IF:   You have wheezing and shortness of breath even if taking medicine to prevent attacks.  You have muscle aches, chest pain or thickening of sputum.   Your sputum changes from clear or white to yellow, green, gray, or bloody.   You have any problems that may be related to the medicine you are taking (such as a rash, itching, swelling or trouble breathing).  SEEK IMMEDIATE MEDICAL CARE IF:   Your usual medicines do not stop your wheezing or there is increased  coughing and/or shortness of breath.   You have increased difficulty breathing.   You have a fever.  MAKE SURE YOU:   Understand these instructions.   Will watch your condition.   Will get help right away if you are not doing well or get worse.  Document Released: 01/29/2005 Document Revised: 10/11/2010 Document Reviewed: 09/17/2007 [facility] Patient Information 2012 [place].

## 2011-04-08 ENCOUNTER — Encounter: Payer: Self-pay | Admitting: Internal Medicine

## 2011-04-08 NOTE — Assessment & Plan Note (Signed)
Start levaquin for the infection

## 2011-04-08 NOTE — Assessment & Plan Note (Addendum)
Check her urine culture today, late note - it is + for E coli with a lot of resistance, nitrofurantoin is the only oral agent she can take so I have called that in to her pharmacy

## 2011-04-08 NOTE — Assessment & Plan Note (Signed)
B12 injection today

## 2011-04-10 LAB — CULTURE, URINE COMPREHENSIVE

## 2011-04-10 MED ORDER — NITROFURANTOIN MONOHYD MACRO 100 MG PO CAPS: 100.0000 mg | ORAL_CAPSULE | Freq: Two times a day (BID) | ORAL | Status: AC

## 2011-04-10 NOTE — Progress Notes (Signed)
Addended by: [name] on: 04/10/2011 08:01 AM   Modules accepted: Orders

## 2011-05-03 ENCOUNTER — Telehealth: Payer: Self-pay | Admitting: Internal Medicine

## 2011-05-03 NOTE — Telephone Encounter (Signed)
Received 5 pages. Sent to Dr. Ronnald Ramp. SD 05/03/11

## 2011-05-28 ENCOUNTER — Ambulatory Visit (INDEPENDENT_AMBULATORY_CARE_PROVIDER_SITE_OTHER): Payer: Managed Care, Other (non HMO) | Admitting: Internal Medicine

## 2011-05-28 ENCOUNTER — Ambulatory Visit (INDEPENDENT_AMBULATORY_CARE_PROVIDER_SITE_OTHER)
Admission: RE | Admit: 2011-05-28 | Discharge: 2011-05-28 | Disposition: A | Discharge status: Home / Self Care | Payer: Managed Care, Other (non HMO) | Source: Ambulatory Visit | Attending: Internal Medicine | Admitting: Internal Medicine

## 2011-05-28 ENCOUNTER — Other Ambulatory Visit: Payer: Self-pay | Admitting: *Deleted

## 2011-05-28 ENCOUNTER — Encounter: Payer: Self-pay | Admitting: Internal Medicine

## 2011-05-28 VITALS — BP 116/78 | HR 85 | Temp 98.4°F | Resp 16 | Wt 214.0 lb

## 2011-05-28 DIAGNOSIS — R05 Cough: Secondary | ICD-10-CM

## 2011-05-28 DIAGNOSIS — R059 Cough, unspecified: Secondary | ICD-10-CM

## 2011-05-28 DIAGNOSIS — J209 Acute bronchitis, unspecified: Secondary | ICD-10-CM

## 2011-05-28 MED ORDER — HYDROCOD POLST-CHLORPHEN POLST 10-8 MG/5ML PO LQCR: 5.0000 mL | Freq: Two times a day (BID) | ORAL | Status: DC | PRN

## 2011-05-28 MED ORDER — AZITHROMYCIN 500 MG PO TABS: 500.0000 mg | ORAL_TABLET | Freq: Every day | ORAL | Status: AC

## 2011-05-28 MED ORDER — HYDROCOD POLST-CPM POLST ER 10-8 MG PO CP12: 1.0000 | ORAL_CAPSULE | Freq: Two times a day (BID) | ORAL | Status: DC | PRN

## 2011-05-28 NOTE — Assessment & Plan Note (Signed)
Start zpak for the infection and a cough suppressant

## 2011-05-28 NOTE — Progress Notes (Signed)
Subjective:    Patient ID: Kirsten [name], female    DOB: 04-02-1955, 56 y.o.   MRN: [id number]  Cough This is a new problem. The current episode started in the past 7 days. The problem has been unchanged. The problem occurs constantly. The cough is productive of purulent sputum. Associated symptoms include chills, postnasal drip and rhinorrhea. Pertinent negatives include no chest pain, ear congestion, ear pain, fever, headaches, heartburn, hemoptysis, myalgias, nasal congestion, rash, sore throat, shortness of breath, sweats, weight loss or wheezing. She has tried OTC cough suppressant for the symptoms. The treatment provided no relief. Her past medical history is significant for pneumonia.      Review of Systems  Constitutional: Positive for chills. Negative for fever, weight loss, diaphoresis, activity change, appetite change, fatigue and unexpected weight change.  HENT: Positive for congestion, rhinorrhea, sneezing and postnasal drip. Negative for hearing loss, ear pain, nosebleeds, sore throat, facial swelling, drooling, mouth sores, trouble swallowing, neck pain, neck stiffness, dental problem, voice change, sinus pressure, tinnitus and ear discharge.   Eyes: Negative.   Respiratory: Positive for cough. Negative for apnea, hemoptysis, choking, chest tightness, shortness of breath, wheezing and stridor.   Cardiovascular: Negative for chest pain, palpitations and leg swelling.  Gastrointestinal: Negative for heartburn, nausea, vomiting, abdominal pain, diarrhea, constipation, blood in stool and abdominal distention.  Genitourinary: Negative.   Musculoskeletal: Negative for myalgias, back pain, joint swelling, arthralgias and gait problem.  Skin: Negative for color change, pallor, rash and wound.  Neurological: Negative.  Negative for headaches.  Hematological: Negative for adenopathy. Does not bruise/bleed easily.  Psychiatric/Behavioral: Negative.        Objective:   Physical Exam    Vitals reviewed. Constitutional: She is oriented to person, place, and time. She appears well-developed and well-nourished. No distress.  HENT:  Head: Normocephalic and atraumatic.  Mouth/Throat: Oropharynx is clear and moist. No oropharyngeal exudate.  Eyes: Conjunctivae are normal. Right eye exhibits no discharge. Left eye exhibits no discharge. No scleral icterus.  Neck: Normal range of motion. Neck supple. No JVD present. No tracheal deviation present. No thyromegaly present.  Cardiovascular: Normal rate, regular rhythm, normal heart sounds and intact distal pulses.  Exam reveals no gallop and no friction rub.   No murmur heard. Pulmonary/Chest: Effort normal and breath sounds normal. No stridor. No respiratory distress. She has no wheezes. She has no rales. She exhibits no tenderness.  Abdominal: Soft. Bowel sounds are normal. She exhibits no distension and no mass. There is no tenderness. There is no rebound and no guarding.  Musculoskeletal: Normal range of motion. She exhibits no edema and no tenderness.  Lymphadenopathy:    She has no cervical adenopathy.  Neurological: She is oriented to person, place, and time.  Skin: Skin is warm and dry. No rash noted. She is not diaphoretic. No erythema. No pallor.  Psychiatric: She has a normal mood and affect. Her behavior is normal. Judgment and thought content normal.      Lab Results  Component Value Date   WBC 9.2 12/05/2010   HGB 15.3* 12/05/2010   HCT 44.4 12/05/2010   PLT 273.0 12/05/2010   GLUCOSE 92 03/22/2011   CHOL 197 03/22/2011   TRIG 250.0* 03/22/2011   HDL 40.40 03/22/2011   LDLDIRECT 122.0 03/22/2011   LDLCALC 98.8 02/02/2010   ALT 36* 01/29/2011   AST 23 01/29/2011   NA 140 03/22/2011   K 4.1 03/22/2011   CL 105 03/22/2011   CREATININE 0.5 03/22/2011  BUN 14 03/22/2011   CO2 28 03/22/2011   TSH 1.53 01/29/2011   HGBA1C 7.0* 03/22/2011   MICROALBUR 2.6* 06/20/2009      Assessment & Plan:

## 2011-05-28 NOTE — Patient Instructions (Signed)
Acute Bronchitis You have acute bronchitis. This means you have a chest cold. The airways in your lungs are red and sore (inflamed). Acute means it is sudden onset.  CAUSES Bronchitis is most often caused by the same virus that causes a cold. SYMPTOMS   Body aches.   Chest congestion.   Chills.   Cough.   Fever.   Shortness of breath.   Sore throat.  TREATMENT  Acute bronchitis is usually treated with rest, fluids, and medicines for relief of fever or cough. Most symptoms should go away after a few days or a week. Increased fluids may help thin your secretions and will prevent dehydration. Your caregiver may give you an inhaler to improve your symptoms. The inhaler reduces shortness of breath and helps control cough. You can take over-the-counter pain relievers or cough medicine to decrease coughing, pain, or fever. A cool-air vaporizer may help thin bronchial secretions and make it easier to clear your chest. Antibiotics are usually not needed but can be prescribed if you smoke, are seriously ill, have chronic lung problems, are elderly, or you are at higher risk for developing complications.Allergies and asthma can make bronchitis worse. Repeated episodes of bronchitis may cause longstanding lung problems. Avoid smoking and secondhand smoke.Exposure to cigarette smoke or irritating chemicals will make bronchitis worse. If you are a cigarette smoker, consider using nicotine gum or skin patches to help control withdrawal symptoms. Quitting smoking will help your lungs heal faster. Recovery from bronchitis is often slow, but you should start feeling better after 2 to 3 days. Cough from bronchitis frequently lasts for 3 to 4 weeks. To prevent another bout of acute bronchitis:  Quit smoking.   Wash your hands frequently to get rid of viruses or use a hand sanitizer.   Avoid other people with cold or virus symptoms.   Try not to touch your hands to your mouth, nose, or eyes.  SEEK  IMMEDIATE MEDICAL CARE IF:  You develop increased fever, chills, or chest pain.   You have severe shortness of breath or bloody sputum.   You develop dehydration, fainting, repeated vomiting, or a severe headache.   You have no improvement after 1 week of treatment or you get worse.  MAKE SURE YOU:   Understand these instructions.   Will watch your condition.   Will get help right away if you are not doing well or get worse.  Document Released: 03/08/2004 Document Revised: 01/18/2011 Document Reviewed: 05/24/2010 [facility] Patient Information 2012 [place].

## 2011-05-28 NOTE — Progress Notes (Signed)
Oral medication unavailable per pharmacy; Ok VO TLJ to substitute Tussiones susp/SLS

## 2011-05-28 NOTE — Assessment & Plan Note (Signed)
I will check her CXR to look for PNA

## 2011-05-29 ENCOUNTER — Telehealth: Payer: Self-pay | Admitting: *Deleted

## 2011-05-29 NOTE — Telephone Encounter (Signed)
No pneumonia

## 2011-05-29 NOTE — Telephone Encounter (Signed)
Request for chest X-rays results.

## 2011-05-30 NOTE — Telephone Encounter (Signed)
Patient Informed

## 2011-06-16 ENCOUNTER — Other Ambulatory Visit: Payer: Self-pay | Admitting: Internal Medicine

## 2011-09-03 ENCOUNTER — Other Ambulatory Visit: Payer: Self-pay | Admitting: *Deleted

## 2011-09-03 MED ORDER — METFORMIN HCL 1000 MG PO TABS: 1000.0000 mg | ORAL_TABLET | Freq: Two times a day (BID) | ORAL | Status: DC

## 2011-09-03 NOTE — Telephone Encounter (Signed)
Pt is [facility] [place] In[place] that [place] states that she stopped taking Janumet and resumed taking Metformin. Okay to refill?-please advise.

## 2011-09-03 NOTE — Telephone Encounter (Signed)
yes

## 2011-09-03 NOTE — Telephone Encounter (Signed)
Rx sent, pt informed via VM and to callback office with any questions/concerns.

## 2011-09-06 ENCOUNTER — Encounter: Payer: Self-pay | Admitting: Internal Medicine

## 2011-09-06 ENCOUNTER — Ambulatory Visit (INDEPENDENT_AMBULATORY_CARE_PROVIDER_SITE_OTHER): Payer: Managed Care, Other (non HMO) | Admitting: Internal Medicine

## 2011-09-06 ENCOUNTER — Other Ambulatory Visit (INDEPENDENT_AMBULATORY_CARE_PROVIDER_SITE_OTHER): Payer: Managed Care, Other (non HMO)

## 2011-09-06 VITALS — BP 124/82 | HR 84 | Temp 98.5°F | Resp 16 | Wt 214.0 lb

## 2011-09-06 DIAGNOSIS — E039 Hypothyroidism, unspecified: Secondary | ICD-10-CM

## 2011-09-06 DIAGNOSIS — E119 Type 2 diabetes mellitus without complications: Secondary | ICD-10-CM

## 2011-09-06 DIAGNOSIS — E782 Mixed hyperlipidemia: Secondary | ICD-10-CM

## 2011-09-06 DIAGNOSIS — I10 Essential (primary) hypertension: Secondary | ICD-10-CM

## 2011-09-06 DIAGNOSIS — N390 Urinary tract infection, site not specified: Secondary | ICD-10-CM

## 2011-09-06 DIAGNOSIS — F329 Major depressive disorder, single episode, unspecified: Secondary | ICD-10-CM

## 2011-09-06 DIAGNOSIS — F3289 Other specified depressive episodes: Secondary | ICD-10-CM

## 2011-09-06 DIAGNOSIS — E785 Hyperlipidemia, unspecified: Secondary | ICD-10-CM

## 2011-09-06 LAB — URINALYSIS, ROUTINE W REFLEX MICROSCOPIC
Bilirubin Urine: NEGATIVE
Hgb urine dipstick: NEGATIVE
Leukocytes, UA: NEGATIVE
Nitrite: NEGATIVE
Total Protein, Urine: NEGATIVE

## 2011-09-06 LAB — CBC WITH DIFFERENTIAL/PLATELET
Basophils Relative: 1.6 % (ref 0.0–3.0)
Eosinophils Absolute: 0.2 10*3/uL (ref 0.0–0.7)
Eosinophils Relative: 2.3 % (ref 0.0–5.0)
HCT: 44.5 % (ref 36.0–46.0)
Hemoglobin: 14.9 g/dL (ref 12.0–15.0)
Lymphs Abs: 3.4 10*3/uL (ref 0.7–4.0)
MCHC: 33.5 g/dL (ref 30.0–36.0)
MCV: 88.4 fl (ref 78.0–100.0)
Monocytes Absolute: 0.6 10*3/uL (ref 0.1–1.0)
Neutro Abs: 5.6 10*3/uL (ref 1.4–7.7)
Neutrophils Relative %: 56.6 % (ref 43.0–77.0)
RBC: 5.03 Mil/uL (ref 3.87–5.11)
WBC: 9.9 10*3/uL (ref 4.5–10.5)

## 2011-09-06 LAB — LIPID PANEL
Cholesterol: 201 mg/dL — ABNORMAL HIGH (ref 0–200)
Total CHOL/HDL Ratio: 5
Triglycerides: 277 mg/dL — ABNORMAL HIGH (ref 0.0–149.0)
VLDL: 55.4 mg/dL — ABNORMAL HIGH (ref 0.0–40.0)

## 2011-09-06 LAB — COMPREHENSIVE METABOLIC PANEL
Alkaline Phosphatase: 77 U/L (ref 39–117)
BUN: 9 mg/dL (ref 6–23)
CO2: 27 mEq/L (ref 19–32)
Creatinine, Ser: 0.6 mg/dL (ref 0.4–1.2)
GFR: 106.58 mL/min (ref >60.00)
Glucose, Bld: 107 mg/dL — ABNORMAL HIGH (ref 70–99)
Sodium: 140 mEq/L (ref 135–145)
Total Bilirubin: 0.6 mg/dL (ref 0.3–1.2)
Total Protein: 7.3 g/dL (ref 6.0–8.3)

## 2011-09-06 LAB — TSH: TSH: 1.79 u[IU]/mL (ref 0.35–5.50)

## 2011-09-06 MED ORDER — ALPRAZOLAM 0.25 MG PO TABS: ORAL_TABLET | ORAL | Status: DC

## 2011-09-06 MED ORDER — PAROXETINE HCL ER 25 MG PO TB24: ORAL_TABLET | ORAL | Status: DC

## 2011-09-06 MED ORDER — LEVOTHYROXINE SODIUM 75 MCG PO TABS: ORAL_TABLET | ORAL | Status: DC

## 2011-09-06 MED ORDER — ZOLPIDEM TARTRATE 10 MG PO TABS: 10.0000 mg | ORAL_TABLET | Freq: Every evening | ORAL | Status: DC | PRN

## 2011-09-06 MED ORDER — METFORMIN HCL 1000 MG PO TABS: 1000.0000 mg | ORAL_TABLET | Freq: Two times a day (BID) | ORAL | Status: DC

## 2011-09-06 NOTE — Progress Notes (Signed)
Subjective:    Patient ID: Kirsten [name], female    DOB: [birth year], 56 y.o.   MRN: [id number]  Diabetes She presents for her follow-up diabetic visit. She has type 2 diabetes mellitus. Her disease course has been stable. Hypoglycemia symptoms include nervousness/anxiousness. Pertinent negatives for hypoglycemia include no confusion. Pertinent negatives for diabetes include no blurred vision, no chest pain, no fatigue, no foot paresthesias, no foot ulcerations, no polydipsia, no polyphagia, no polyuria, no visual change, no weakness and no weight loss. There are no hypoglycemic complications. Symptoms are stable. There are no diabetic complications. Current diabetic treatment includes oral agent (monotherapy) and diet. She is compliant with treatment all of the time. Her weight is stable. She is following a generally healthy diet. Meal planning includes avoidance of concentrated sweets. She participates in exercise intermittently. There is no change in her home blood glucose trend. An ACE inhibitor/angiotensin II receptor blocker is not being taken. She does not see a podiatrist.Eye exam is current.      Review of Systems  Constitutional: Negative for fever, chills, weight loss, diaphoresis, activity change, appetite change, fatigue and unexpected weight change.  HENT: Negative.   Eyes: Negative.  Negative for blurred vision.  Respiratory: Negative for cough, chest tightness, shortness of breath, wheezing and stridor.   Cardiovascular: Negative for chest pain, palpitations and leg swelling.  Gastrointestinal: Negative for nausea, vomiting, abdominal pain, diarrhea, constipation and abdominal distention.  Genitourinary: Positive for urgency. Negative for dysuria, polyuria, frequency, hematuria, flank pain, decreased urine volume, enuresis, difficulty urinating and dyspareunia.  Musculoskeletal: Negative.   Skin: Negative.   Neurological: Negative.  Negative for weakness.  Hematological:  Negative for polydipsia, polyphagia and adenopathy. Does not bruise/bleed easily.  Psychiatric/Behavioral: Positive for disturbed wake/sleep cycle. Negative for suicidal ideas, hallucinations, behavioral problems, confusion, self-injury, dysphoric mood, decreased concentration and agitation. The patient is nervous/anxious. The patient is not hyperactive.        Objective:   Physical Exam  Constitutional: She is oriented to person, place, and time. She appears well-developed and well-nourished. No distress.  HENT:  Head: Normocephalic and atraumatic.  Mouth/Throat: Oropharynx is clear and moist. No oropharyngeal exudate.  Eyes: Conjunctivae are normal. Right eye exhibits no discharge. Left eye exhibits no discharge. No scleral icterus.  Neck: Normal range of motion. Neck supple. No JVD present. No tracheal deviation present. No thyromegaly present.  Cardiovascular: Normal rate, regular rhythm, normal heart sounds and intact distal pulses.  Exam reveals no gallop and no friction rub.   No murmur heard. Pulmonary/Chest: Effort normal and breath sounds normal. No stridor. No respiratory distress. She has no wheezes. She has no rales. She exhibits no tenderness.  Abdominal: Soft. Bowel sounds are normal. She exhibits no distension and no mass. There is no tenderness. There is no rebound and no guarding.  Musculoskeletal: Normal range of motion. She exhibits no edema and no tenderness.  Lymphadenopathy:    She has no cervical adenopathy.  Neurological: She is oriented to person, place, and time.  Skin: Skin is warm and dry. No rash noted. She is not diaphoretic. No erythema. No pallor.  Psychiatric: She has a normal mood and affect. Her speech is normal and behavior is normal. Judgment and thought content normal. Her mood appears not anxious. Her affect is not angry, not blunt, not labile and not inappropriate. Cognition and memory are normal. She does not exhibit a depressed mood.      Lab  Results  Component Value Date   WBC  9.2 12/05/2010   HGB 15.3* 12/05/2010   HCT 44.4 12/05/2010   PLT 273.0 12/05/2010   GLUCOSE 92 03/22/2011   CHOL 197 03/22/2011   TRIG 250.0* 03/22/2011   HDL 40.40 03/22/2011   LDLDIRECT 122.0 03/22/2011   LDLCALC 98.8 02/02/2010   ALT 36* 01/29/2011   AST 23 01/29/2011   NA 140 03/22/2011   K 4.1 03/22/2011   CL 105 03/22/2011   CREATININE 0.5 03/22/2011   BUN 14 03/22/2011   CO2 28 03/22/2011   TSH 1.53 01/29/2011   HGBA1C 7.0* 03/22/2011   MICROALBUR 2.6* 06/20/2009      Assessment & Plan:

## 2011-09-06 NOTE — Assessment & Plan Note (Signed)
She'll continue her current meds

## 2011-09-06 NOTE — Assessment & Plan Note (Signed)
I will check her UA and urine clx

## 2011-09-06 NOTE — Assessment & Plan Note (Signed)
For a lipid panel today

## 2011-09-06 NOTE — Assessment & Plan Note (Signed)
TSH today

## 2011-09-06 NOTE — Assessment & Plan Note (Signed)
I will check her a1c and will monitor her renal function 

## 2011-09-06 NOTE — Patient Instructions (Signed)

## 2011-09-06 NOTE — Assessment & Plan Note (Signed)
Her BP is well controlled, I will check her lytes and renal function 

## 2011-11-23 ENCOUNTER — Other Ambulatory Visit (INDEPENDENT_AMBULATORY_CARE_PROVIDER_SITE_OTHER): Payer: Managed Care, Other (non HMO)

## 2011-11-23 ENCOUNTER — Encounter: Payer: Self-pay | Admitting: Internal Medicine

## 2011-11-23 ENCOUNTER — Ambulatory Visit (INDEPENDENT_AMBULATORY_CARE_PROVIDER_SITE_OTHER): Payer: Managed Care, Other (non HMO) | Admitting: Internal Medicine

## 2011-11-23 VITALS — BP 112/72 | HR 95 | Temp 98.6°F | Resp 16 | Wt 212.0 lb

## 2011-11-23 DIAGNOSIS — J45901 Unspecified asthma with (acute) exacerbation: Secondary | ICD-10-CM

## 2011-11-23 DIAGNOSIS — Z23 Encounter for immunization: Secondary | ICD-10-CM

## 2011-11-23 DIAGNOSIS — E039 Hypothyroidism, unspecified: Secondary | ICD-10-CM

## 2011-11-23 DIAGNOSIS — I10 Essential (primary) hypertension: Secondary | ICD-10-CM

## 2011-11-23 DIAGNOSIS — E119 Type 2 diabetes mellitus without complications: Secondary | ICD-10-CM

## 2011-11-23 DIAGNOSIS — E538 Deficiency of other specified B group vitamins: Secondary | ICD-10-CM

## 2011-11-23 LAB — HEMOGLOBIN A1C: Hgb A1c MFr Bld: 6.5 % (ref 4.6–6.5)

## 2011-11-23 LAB — BASIC METABOLIC PANEL
CO2: 28 mEq/L (ref 19–32)
GFR: 115.01 mL/min (ref >60.00)
Glucose, Bld: 185 mg/dL — ABNORMAL HIGH (ref 70–99)
Potassium: 4.1 mEq/L (ref 3.5–5.1)
Sodium: 140 mEq/L (ref 135–145)

## 2011-11-23 NOTE — Assessment & Plan Note (Signed)
Well controlled 

## 2011-11-23 NOTE — Assessment & Plan Note (Signed)
A1C and BMP today, will adjust meds if a1c is too high or too low

## 2011-11-23 NOTE — Assessment & Plan Note (Signed)
I will check her TSH today 

## 2011-11-23 NOTE — Patient Instructions (Signed)

## 2011-11-23 NOTE — Progress Notes (Signed)
  Subjective:    Patient ID: Kirsten [name], female    DOB: December 26, 1955, 56 y.o.   MRN: [id number]  Thyroid Problem Presents for follow-up visit. Patient reports no anxiety, cold intolerance, constipation, depressed mood, diaphoresis, diarrhea, dry skin, fatigue, hair loss, heat intolerance, hoarse voice, leg swelling, menstrual problem, nail problem, palpitations, tremors, visual change, weight gain or weight loss. The symptoms have been stable.      Review of Systems  Constitutional: Negative for fever, chills, weight loss, weight gain, diaphoresis, activity change, appetite change, fatigue and unexpected weight change.  HENT: Negative.  Negative for hoarse voice.   Eyes: Negative.   Respiratory: Negative for cough, chest tightness, shortness of breath and stridor.   Cardiovascular: Negative for chest pain, palpitations and leg swelling.  Gastrointestinal: Negative for nausea, vomiting, abdominal pain, diarrhea, constipation and anal bleeding.  Genitourinary: Negative for dysuria, urgency, frequency, decreased urine volume, difficulty urinating, vaginal pain, menstrual problem and dyspareunia.  Musculoskeletal: Negative.   Skin: Negative.   Neurological: Negative.  Negative for tremors.  Hematological: Negative for cold intolerance, heat intolerance and adenopathy. Does not bruise/bleed easily.  Psychiatric/Behavioral: Negative.        Objective:   Physical Exam  Vitals reviewed. Constitutional: She is oriented to person, place, and time. She appears well-developed and well-nourished. No distress.  HENT:  Head: Normocephalic and atraumatic.  Mouth/Throat: Oropharynx is clear and moist. No oropharyngeal exudate.  Eyes: Conjunctivae normal are normal. Right eye exhibits no discharge. Left eye exhibits no discharge. No scleral icterus.  Neck: Normal range of motion. Neck supple. No JVD present. No tracheal deviation present. No thyromegaly present.  Cardiovascular: Normal rate,  regular rhythm, normal heart sounds and intact distal pulses.  Exam reveals no gallop and no friction rub.   No murmur heard. Pulmonary/Chest: Effort normal and breath sounds normal. No stridor. No respiratory distress. She has no wheezes. She has no rales. She exhibits no tenderness.  Abdominal: Soft. Bowel sounds are normal. She exhibits no distension and no mass. There is no tenderness. There is no rebound and no guarding.  Musculoskeletal: Normal range of motion. She exhibits no edema and no tenderness.  Lymphadenopathy:    She has no cervical adenopathy.  Neurological: She is oriented to person, place, and time.  Skin: Skin is warm and dry. No rash noted. She is not diaphoretic. No erythema. No pallor.  Psychiatric: She has a normal mood and affect. Her behavior is normal. Judgment and thought content normal.      Lab Results  Component Value Date   WBC 9.9 09/06/2011   HGB 14.9 09/06/2011   HCT 44.5 09/06/2011   PLT 278.0 09/06/2011   GLUCOSE 107* 09/06/2011   CHOL 201* 09/06/2011   TRIG 277.0* 09/06/2011   HDL 43.50 09/06/2011   LDLDIRECT 117.7 09/06/2011   LDLCALC 98.8 02/02/2010   ALT 32 09/06/2011   AST 22 09/06/2011   NA 140 09/06/2011   K 4.1 09/06/2011   CL 104 09/06/2011   CREATININE 0.6 09/06/2011   BUN 9 09/06/2011   CO2 27 09/06/2011   TSH 1.79 09/06/2011   HGBA1C 7.0* 09/06/2011   MICROALBUR 2.6* 06/20/2009      Assessment & Plan:

## 2011-11-23 NOTE — Assessment & Plan Note (Signed)
Her BP is well controlled, I will check lytes and renal function today

## 2012-02-15 ENCOUNTER — Ambulatory Visit (INDEPENDENT_AMBULATORY_CARE_PROVIDER_SITE_OTHER): Payer: Managed Care, Other (non HMO) | Admitting: Internal Medicine

## 2012-02-15 ENCOUNTER — Encounter: Payer: Self-pay | Admitting: Internal Medicine

## 2012-02-15 ENCOUNTER — Other Ambulatory Visit (INDEPENDENT_AMBULATORY_CARE_PROVIDER_SITE_OTHER): Payer: Managed Care, Other (non HMO)

## 2012-02-15 VITALS — BP 144/90 | HR 94 | Temp 98.0°F | Resp 16 | Wt 213.8 lb

## 2012-02-15 DIAGNOSIS — E1129 Type 2 diabetes mellitus with other diabetic kidney complication: Secondary | ICD-10-CM

## 2012-02-15 DIAGNOSIS — E039 Hypothyroidism, unspecified: Secondary | ICD-10-CM

## 2012-02-15 DIAGNOSIS — E538 Deficiency of other specified B group vitamins: Secondary | ICD-10-CM

## 2012-02-15 DIAGNOSIS — E1165 Type 2 diabetes mellitus with hyperglycemia: Secondary | ICD-10-CM

## 2012-02-15 DIAGNOSIS — E119 Type 2 diabetes mellitus without complications: Secondary | ICD-10-CM

## 2012-02-15 DIAGNOSIS — I10 Essential (primary) hypertension: Secondary | ICD-10-CM

## 2012-02-15 DIAGNOSIS — F329 Major depressive disorder, single episode, unspecified: Secondary | ICD-10-CM

## 2012-02-15 DIAGNOSIS — F3289 Other specified depressive episodes: Secondary | ICD-10-CM

## 2012-02-15 LAB — BASIC METABOLIC PANEL
Chloride: 105 mEq/L (ref 96–112)
Creatinine, Ser: 0.6 mg/dL (ref 0.4–1.2)
GFR: 108.42 mL/min (ref >60.00)
Potassium: 3.8 mEq/L (ref 3.5–5.1)

## 2012-02-15 LAB — TSH: TSH: 2.6 u[IU]/mL (ref 0.35–5.50)

## 2012-02-15 LAB — HEMOGLOBIN A1C: Hgb A1c MFr Bld: 7.1 % — ABNORMAL HIGH (ref 4.6–6.5)

## 2012-02-15 MED ORDER — CYANOCOBALAMIN 1000 MCG/ML IJ SOLN
1000.0000 ug | Freq: Once | INTRAMUSCULAR | Status: AC
  Administered 2012-02-15: 1000 ug via INTRAMUSCULAR

## 2012-02-15 MED ORDER — HYDROCHLOROTHIAZIDE 25 MG PO TABS: 25.0000 mg | ORAL_TABLET | Freq: Every day | ORAL | Status: DC

## 2012-02-15 MED ORDER — METFORMIN HCL 1000 MG PO TABS: 1000.0000 mg | ORAL_TABLET | Freq: Two times a day (BID) | ORAL | Status: DC

## 2012-02-15 MED ORDER — ZOLPIDEM TARTRATE 10 MG PO TABS: 10.0000 mg | ORAL_TABLET | Freq: Every evening | ORAL | Status: DC | PRN

## 2012-02-15 MED ORDER — LEVOTHYROXINE SODIUM 75 MCG PO TABS: ORAL_TABLET | ORAL | Status: DC

## 2012-02-15 MED ORDER — PAROXETINE HCL ER 25 MG PO TB24: ORAL_TABLET | ORAL | Status: DC

## 2012-02-15 MED ORDER — OLMESARTAN MEDOXOMIL 40 MG PO TABS: 40.0000 mg | ORAL_TABLET | Freq: Every day | ORAL | Status: DC

## 2012-02-15 NOTE — Assessment & Plan Note (Signed)
Will add benicar to the hctz Will check her renal function and lytes today

## 2012-02-15 NOTE — Assessment & Plan Note (Signed)
I will check her TSH level today and will adjust her dose if needed

## 2012-02-15 NOTE — Progress Notes (Signed)
Subjective:    Patient ID: Kirsten [name], female    DOB: [birth year], 57 y.o.   MRN: [id number]  Hypertension This is a new problem. The current episode started more than 1 month ago. The problem has been gradually worsening since onset. The problem is uncontrolled. Pertinent negatives include no anxiety, blurred vision, chest pain, headaches, malaise/fatigue, neck pain, orthopnea, palpitations, peripheral edema, PND, shortness of breath or sweats. Agents associated with hypertension include NSAIDs and thyroid hormones. Risk factors for coronary artery disease include obesity. Past treatments include diuretics. The current treatment provides mild improvement. Compliance problems include exercise and diet.  Hypertensive end-organ damage includes kidney disease and a thyroid problem. Identifiable causes of hypertension include sleep apnea.      Review of Systems  Constitutional: Negative for fever, chills, malaise/fatigue, diaphoresis, activity change, appetite change, fatigue and unexpected weight change.  HENT: Negative.  Negative for sore throat, mouth sores, trouble swallowing, neck pain and voice change.   Eyes: Negative.  Negative for blurred vision.  Respiratory: Positive for apnea. Negative for cough, choking, chest tightness, shortness of breath, wheezing and stridor.   Cardiovascular: Negative for chest pain, palpitations, orthopnea, leg swelling and PND.  Gastrointestinal: Negative for nausea, vomiting, abdominal pain and constipation.  Genitourinary: Negative.   Musculoskeletal: Negative.  Negative for myalgias, back pain, joint swelling, arthralgias and gait problem.  Skin: Negative for color change, rash and wound.  Neurological: Negative for dizziness, tremors, seizures, syncope, facial asymmetry, speech difficulty, weakness, light-headedness, numbness and headaches.  Hematological: Negative for adenopathy. Does not bruise/bleed easily.  Psychiatric/Behavioral: Positive for sleep  disturbance and dysphoric mood. Negative for suicidal ideas, hallucinations, behavioral problems, confusion, self-injury, decreased concentration and agitation. The patient is not nervous/anxious and is not hyperactive.        Objective:   Physical Exam  Vitals reviewed. Constitutional: She is oriented to person, place, and time. She appears well-developed and well-nourished. No distress.  HENT:  Head: Normocephalic and atraumatic.  Mouth/Throat: Oropharynx is clear and moist. No oropharyngeal exudate.  Eyes: Conjunctivae normal are normal. Right eye exhibits no discharge. Left eye exhibits no discharge. No scleral icterus.  Neck: Normal range of motion. Neck supple. No JVD present. No tracheal deviation present. No thyromegaly present.  Cardiovascular: Normal rate, regular rhythm, normal heart sounds and intact distal pulses.  Exam reveals no gallop and no friction rub.   No murmur heard. Pulmonary/Chest: Effort normal and breath sounds normal. No stridor. No respiratory distress. She has no wheezes. She has no rales. She exhibits no tenderness.  Abdominal: Soft. Bowel sounds are normal. She exhibits no distension and no mass. There is no tenderness. There is no rebound and no guarding.  Musculoskeletal: Normal range of motion. She exhibits no edema and no tenderness.  Lymphadenopathy:    She has no cervical adenopathy.  Neurological: She is oriented to person, place, and time.  Skin: Skin is warm and dry. No rash noted. She is not diaphoretic. No erythema. No pallor.  Psychiatric: She has a normal mood and affect. Her behavior is normal. Judgment and thought content normal.      Lab Results  Component Value Date   WBC 9.9 09/06/2011   HGB 14.9 09/06/2011   HCT 44.5 09/06/2011   PLT 278.0 09/06/2011   GLUCOSE 185* 11/23/2011   CHOL 201* 09/06/2011   TRIG 277.0* 09/06/2011   HDL 43.50 09/06/2011   LDLDIRECT 117.7 09/06/2011   LDLCALC 98.8 02/02/2010   ALT 32 09/06/2011   AST 22  09/06/2011   NA 140 11/23/2011   K 4.1 11/23/2011   CL 103 11/23/2011   CREATININE 0.6 11/23/2011   BUN 11 11/23/2011   CO2 28 11/23/2011   TSH 1.98 11/23/2011   HGBA1C 6.5 11/23/2011   MICROALBUR 2.6* 06/20/2009      Assessment & Plan:

## 2012-02-15 NOTE — Assessment & Plan Note (Signed)
I will recheck her a1c and will address if needed She needs to start an ARB for renal protection

## 2012-02-15 NOTE — Patient Instructions (Signed)

## 2012-02-15 NOTE — Assessment & Plan Note (Signed)
B12 injection today

## 2012-02-21 ENCOUNTER — Ambulatory Visit: Payer: Self-pay | Admitting: Obstetrics and Gynecology

## 2012-03-13 ENCOUNTER — Ambulatory Visit: Payer: Self-pay | Admitting: Anesthesiology

## 2012-03-13 LAB — CBC WITH DIFFERENTIAL/PLATELET
Basophil #: 0.1 10*3/uL (ref 0.0–0.1)
Basophil %: 0.7 %
Eosinophil #: 0.2 10*3/uL (ref 0.0–0.7)
Eosinophil %: 2.8 %
HCT: 42.5 % (ref 35.0–47.0)
Lymphocyte #: 3 10*3/uL (ref 1.0–3.6)
Lymphocyte %: 37.8 %
MCH: 29.4 pg (ref 26.0–34.0)
MCHC: 33.7 g/dL (ref 32.0–36.0)
MCV: 87 fL (ref 80–100)
Monocyte #: 0.4 x10 3/mm (ref 0.2–0.9)
Monocyte %: 5.5 %
Neutrophil #: 4.3 10*3/uL (ref 1.4–6.5)
Neutrophil %: 53.2 %
Platelet: 257 10*3/uL (ref 150–440)
RBC: 4.89 10*6/uL (ref 3.80–5.20)
RDW: 13.4 % (ref 11.5–14.5)
WBC: 8 10*3/uL (ref 3.6–11.0)

## 2012-03-13 LAB — BASIC METABOLIC PANEL
Anion Gap: 9 (ref 7–16)
BUN: 15 mg/dL (ref 7–18)
Calcium, Total: 9.4 mg/dL (ref 8.5–10.1)
Chloride: 109 mmol/L — ABNORMAL HIGH (ref 98–107)
Co2: 25 mmol/L (ref 21–32)
Creatinine: 0.52 mg/dL — ABNORMAL LOW (ref 0.60–1.30)
EGFR (African American): >60
EGFR (Non-African Amer.): >60
Glucose: 134 mg/dL — ABNORMAL HIGH (ref 65–99)
Osmolality: 288 (ref 275–301)
Sodium: 143 mmol/L (ref 136–145)

## 2012-03-18 ENCOUNTER — Other Ambulatory Visit: Payer: Self-pay | Admitting: Internal Medicine

## 2012-03-19 ENCOUNTER — Ambulatory Visit: Payer: Self-pay | Admitting: Surgery

## 2012-03-19 HISTORY — PX: BREAST EXCISIONAL BIOPSY: SUR124

## 2012-03-20 LAB — PATHOLOGY REPORT

## 2012-07-02 ENCOUNTER — Ambulatory Visit: Payer: Managed Care, Other (non HMO) | Admitting: Internal Medicine

## 2012-07-09 ENCOUNTER — Other Ambulatory Visit (INDEPENDENT_AMBULATORY_CARE_PROVIDER_SITE_OTHER): Payer: Managed Care, Other (non HMO)

## 2012-07-09 ENCOUNTER — Ambulatory Visit (INDEPENDENT_AMBULATORY_CARE_PROVIDER_SITE_OTHER): Payer: Managed Care, Other (non HMO) | Admitting: Internal Medicine

## 2012-07-09 ENCOUNTER — Encounter: Payer: Self-pay | Admitting: Internal Medicine

## 2012-07-09 VITALS — BP 116/72 | HR 87 | Temp 98.5°F | Resp 16 | Wt 209.0 lb

## 2012-07-09 DIAGNOSIS — E782 Mixed hyperlipidemia: Secondary | ICD-10-CM

## 2012-07-09 DIAGNOSIS — M25569 Pain in unspecified knee: Secondary | ICD-10-CM

## 2012-07-09 DIAGNOSIS — E039 Hypothyroidism, unspecified: Secondary | ICD-10-CM

## 2012-07-09 DIAGNOSIS — I10 Essential (primary) hypertension: Secondary | ICD-10-CM

## 2012-07-09 DIAGNOSIS — E1129 Type 2 diabetes mellitus with other diabetic kidney complication: Secondary | ICD-10-CM

## 2012-07-09 DIAGNOSIS — M25552 Pain in left hip: Secondary | ICD-10-CM | POA: Insufficient documentation

## 2012-07-09 DIAGNOSIS — M25559 Pain in unspecified hip: Secondary | ICD-10-CM

## 2012-07-09 DIAGNOSIS — E538 Deficiency of other specified B group vitamins: Secondary | ICD-10-CM

## 2012-07-09 DIAGNOSIS — G8929 Other chronic pain: Secondary | ICD-10-CM | POA: Insufficient documentation

## 2012-07-09 DIAGNOSIS — G4730 Sleep apnea, unspecified: Secondary | ICD-10-CM

## 2012-07-09 DIAGNOSIS — E1165 Type 2 diabetes mellitus with hyperglycemia: Secondary | ICD-10-CM

## 2012-07-09 LAB — COMPREHENSIVE METABOLIC PANEL
ALT: 28 U/L (ref 0–35)
AST: 20 U/L (ref 0–37)
Albumin: 4.4 g/dL (ref 3.5–5.2)
Alkaline Phosphatase: 76 U/L (ref 39–117)
Glucose, Bld: 106 mg/dL — ABNORMAL HIGH (ref 70–99)
Potassium: 3.9 mEq/L (ref 3.5–5.1)
Sodium: 138 mEq/L (ref 135–145)
Total Bilirubin: 0.7 mg/dL (ref 0.3–1.2)
Total Protein: 7.4 g/dL (ref 6.0–8.3)

## 2012-07-09 LAB — CBC WITH DIFFERENTIAL/PLATELET
Eosinophils Absolute: 0.3 10*3/uL (ref 0.0–0.7)
Eosinophils Relative: 2.5 % (ref 0.0–5.0)
HCT: 44.9 % (ref 36.0–46.0)
Lymphs Abs: 4.5 10*3/uL — ABNORMAL HIGH (ref 0.7–4.0)
MCHC: 34.2 g/dL (ref 30.0–36.0)
MCV: 85.4 fl (ref 78.0–100.0)
Monocytes Absolute: 0.5 10*3/uL (ref 0.1–1.0)
Neutrophils Relative %: 52.9 % (ref 43.0–77.0)
Platelets: 280 10*3/uL (ref 150.0–400.0)
RDW: 13.9 % (ref 11.5–14.6)
WBC: 11.4 10*3/uL — ABNORMAL HIGH (ref 4.5–10.5)

## 2012-07-09 LAB — HEMOGLOBIN A1C: Hgb A1c MFr Bld: 6.6 % — ABNORMAL HIGH (ref 4.6–6.5)

## 2012-07-09 LAB — LIPID PANEL
Total CHOL/HDL Ratio: 5
Triglycerides: 303 mg/dL — ABNORMAL HIGH (ref 0.0–149.0)

## 2012-07-09 LAB — SEDIMENTATION RATE: Sed Rate: 10 mm/hr (ref 0–22)

## 2012-07-09 LAB — LDL CHOLESTEROL, DIRECT: Direct LDL: 120.2 mg/dL

## 2012-07-09 LAB — TSH: TSH: 2.08 u[IU]/mL (ref 0.35–5.50)

## 2012-07-09 MED ORDER — CYANOCOBALAMIN 1000 MCG/ML IJ SOLN
1000.0000 ug | Freq: Once | INTRAMUSCULAR | Status: AC
  Administered 2012-07-09: 1000 ug via INTRAMUSCULAR

## 2012-07-09 MED ORDER — NAPROXEN-ESOMEPRAZOLE 500-20 MG PO TBEC: 1.0000 | DELAYED_RELEASE_TABLET | Freq: Two times a day (BID) | ORAL | Status: DC

## 2012-07-09 NOTE — Assessment & Plan Note (Signed)
She needs a f/up with sleep med

## 2012-07-09 NOTE — Assessment & Plan Note (Signed)
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

## 2012-07-09 NOTE — Assessment & Plan Note (Signed)
I will recheck her TSH level and will adjust her dose if needed 

## 2012-07-09 NOTE — Assessment & Plan Note (Signed)
Her BP is well controlled Today I will check her lytes and renal function 

## 2012-07-09 NOTE — Assessment & Plan Note (Signed)
CBC today

## 2012-07-09 NOTE — Progress Notes (Signed)
Subjective:    Patient ID: Kirsten [name], female    DOB: 01-Nov-1955, 57 y.o.   MRN: [id number]  Arthritis Presents for follow-up visit. She complains of pain and stiffness. She reports no joint swelling or joint warmth. The symptoms have been worsening. Affected locations include the left elbow, right elbow, left hip, right hip, left knee and right knee. Her pain is at a severity of 3/10. Pertinent negatives include no diarrhea, dry eyes, dry mouth, dysuria, fatigue, fever, pain at night, pain while resting, rash, Raynaud's syndrome, uveitis or weight loss. Compliance with total regimen is 51-75%. Compliance with medications: she has tried tylenol and low dose aleve.      Review of Systems  Constitutional: Negative.  Negative for fever, chills, weight loss, diaphoresis, activity change, appetite change, fatigue and unexpected weight change.  HENT: Negative.   Eyes: Negative.   Respiratory: Positive for apnea. Negative for cough, choking, chest tightness, shortness of breath, wheezing and stridor.   Cardiovascular: Negative.  Negative for chest pain, palpitations and leg swelling.  Gastrointestinal: Negative.  Negative for nausea, vomiting, abdominal pain, diarrhea and constipation.  Endocrine: Negative.  Negative for polydipsia, polyphagia and polyuria.  Genitourinary: Negative.  Negative for dysuria, urgency, frequency, hematuria, flank pain, decreased urine volume and difficulty urinating.  Musculoskeletal: Positive for arthritis and stiffness. Negative for myalgias, back pain, joint swelling and gait problem.  Skin: Negative for color change, pallor, rash and wound.  Allergic/Immunologic: Negative.   Neurological: Negative.  Negative for dizziness.  Hematological: Negative.  Negative for adenopathy. Does not bruise/bleed easily.  Psychiatric/Behavioral: Negative.        Objective:   Physical Exam  Vitals reviewed. Constitutional: She is oriented to person, place, and time. She  appears well-developed and well-nourished. No distress.  HENT:  Head: Normocephalic and atraumatic.  Mouth/Throat: Oropharynx is clear and moist. No oropharyngeal exudate.  Eyes: Conjunctivae are normal. Right eye exhibits no discharge. Left eye exhibits no discharge. No scleral icterus.  Neck: Normal range of motion. Neck supple. No JVD present. No tracheal deviation present. No thyromegaly present.  Cardiovascular: Normal rate, regular rhythm, normal heart sounds and intact distal pulses.  Exam reveals no gallop and no friction rub.   No murmur heard. Pulmonary/Chest: Effort normal and breath sounds normal. No stridor. No respiratory distress. She has no wheezes. She has no rales. She exhibits no tenderness.  Abdominal: Soft. Bowel sounds are normal. She exhibits no distension and no mass. There is no tenderness. There is no rebound and no guarding.  Musculoskeletal: Normal range of motion. She exhibits no edema and no tenderness.       Right knee: Normal. She exhibits normal range of motion, no swelling, no effusion, no ecchymosis and no deformity.       Left knee: Normal. She exhibits normal range of motion, no swelling, no effusion, no ecchymosis and no deformity.  Lymphadenopathy:    She has no cervical adenopathy.  Neurological: She is oriented to person, place, and time.  Skin: Skin is warm and dry. No rash noted. She is not diaphoretic. No erythema. No pallor.  Psychiatric: She has a normal mood and affect. Her behavior is normal. Judgment and thought content normal.     Lab Results  Component Value Date   WBC 9.9 09/06/2011   HGB 14.9 09/06/2011   HCT 44.5 09/06/2011   PLT 278.0 09/06/2011   GLUCOSE 105* 02/15/2012   CHOL 201* 09/06/2011   TRIG 277.0* 09/06/2011   HDL 43.50 09/06/2011  LDLDIRECT 117.7 09/06/2011   LDLCALC 98.8 02/02/2010   ALT 32 09/06/2011   AST 22 09/06/2011   NA 140 02/15/2012   K 3.8 02/15/2012   CL 105 02/15/2012   CREATININE 0.6 02/15/2012   BUN 11 02/15/2012   CO2  29 02/15/2012   TSH 2.60 02/15/2012   HGBA1C 7.1* 02/15/2012   MICROALBUR 2.6* 06/20/2009       Assessment & Plan:

## 2012-07-09 NOTE — Assessment & Plan Note (Signed)
I will check her A1C and will address if needed Will also monitor her renal function

## 2012-07-09 NOTE — Patient Instructions (Signed)
Degenerative Arthritis You have osteoarthritis. This is the wear and tear arthritis that comes with aging. It is also called degenerative arthritis. This is common in people past middle age. It is caused by stress on the joints. The large weight bearing joints of the lower extremities are most often affected. The knees, hips, back, neck, and hands can become painful, swollen, and stiff. This is the most common type of arthritis. It comes on with age, carrying too much weight, or from an injury. Treatment includes resting the sore joint until the pain and swelling improve. Crutches or a walker may be needed for severe flares. Only take over-the-counter or prescription medicines for pain, discomfort, or fever as directed by your caregiver. Local heat therapy may improve motion. Cortisone shots into the joint are sometimes used to reduce pain and swelling during flares. Osteoarthritis is usually not crippling and progresses slowly. There are things you can do to decrease pain:  Avoid high impact activities.  Exercise regularly.  Low impact exercises such as walking, biking and swimming help to keep the muscles strong and keep normal joint function.  Stretching helps to keep your range of motion.  Lose weight if you are overweight. This reduces joint stress. In severe cases when you have pain at rest or increasing disability, joint surgery may be helpful. See your caregiver for follow-up treatment as recommended.  SEEK IMMEDIATE MEDICAL CARE IF:   You have severe joint pain.  Marked swelling and redness in your joint develops.  You develop a high fever. Document Released: 01/29/2005 Document Revised: 04/23/2011 Document Reviewed: 07/01/2006 [facility] Patient Information 2014 [place], [place].

## 2012-07-09 NOTE — Assessment & Plan Note (Signed)
FLP today 

## 2012-07-10 ENCOUNTER — Encounter: Payer: Self-pay | Admitting: Internal Medicine

## 2012-07-10 LAB — B. BURGDORFI ANTIBODIES BY WB: B burgdorferi IgG Abs (IB): NEGATIVE

## 2012-07-25 ENCOUNTER — Encounter: Payer: Self-pay | Admitting: Pulmonary Disease

## 2012-08-01 ENCOUNTER — Institutional Professional Consult (permissible substitution): Payer: Managed Care, Other (non HMO) | Admitting: Pulmonary Disease

## 2012-09-16 ENCOUNTER — Other Ambulatory Visit: Payer: Self-pay | Admitting: Internal Medicine

## 2012-10-22 ENCOUNTER — Ambulatory Visit (INDEPENDENT_AMBULATORY_CARE_PROVIDER_SITE_OTHER): Payer: Managed Care, Other (non HMO) | Admitting: Internal Medicine

## 2012-10-22 ENCOUNTER — Ambulatory Visit (INDEPENDENT_AMBULATORY_CARE_PROVIDER_SITE_OTHER): Payer: Managed Care, Other (non HMO)

## 2012-10-22 ENCOUNTER — Encounter: Payer: Self-pay | Admitting: Internal Medicine

## 2012-10-22 ENCOUNTER — Ambulatory Visit: Payer: Managed Care, Other (non HMO) | Admitting: Internal Medicine

## 2012-10-22 VITALS — BP 122/80 | HR 89 | Temp 98.0°F | Resp 16 | Wt 211.0 lb

## 2012-10-22 DIAGNOSIS — N390 Urinary tract infection, site not specified: Secondary | ICD-10-CM

## 2012-10-22 DIAGNOSIS — E039 Hypothyroidism, unspecified: Secondary | ICD-10-CM

## 2012-10-22 DIAGNOSIS — E1129 Type 2 diabetes mellitus with other diabetic kidney complication: Secondary | ICD-10-CM

## 2012-10-22 DIAGNOSIS — I10 Essential (primary) hypertension: Secondary | ICD-10-CM

## 2012-10-22 DIAGNOSIS — E782 Mixed hyperlipidemia: Secondary | ICD-10-CM

## 2012-10-22 DIAGNOSIS — E785 Hyperlipidemia, unspecified: Secondary | ICD-10-CM

## 2012-10-22 DIAGNOSIS — G4730 Sleep apnea, unspecified: Secondary | ICD-10-CM

## 2012-10-22 DIAGNOSIS — E538 Deficiency of other specified B group vitamins: Secondary | ICD-10-CM

## 2012-10-22 DIAGNOSIS — Z23 Encounter for immunization: Secondary | ICD-10-CM

## 2012-10-22 LAB — CBC WITH DIFFERENTIAL/PLATELET
Basophils Absolute: 0.1 10*3/uL (ref 0.0–0.1)
HCT: 42.8 % (ref 36.0–46.0)
Lymphs Abs: 3.9 10*3/uL (ref 0.7–4.0)
MCV: 84.3 fl (ref 78.0–100.0)
Monocytes Absolute: 0.5 10*3/uL (ref 0.1–1.0)
Neutrophils Relative %: 49.7 % (ref 43.0–77.0)
Platelets: 276 10*3/uL (ref 150.0–400.0)
RDW: 13.8 % (ref 11.5–14.6)

## 2012-10-22 LAB — URINALYSIS, ROUTINE W REFLEX MICROSCOPIC
Bilirubin Urine: NEGATIVE
Hgb urine dipstick: NEGATIVE
Nitrite: NEGATIVE
Urobilinogen, UA: 0.2 (ref 0.0–1.0)

## 2012-10-22 LAB — COMPREHENSIVE METABOLIC PANEL
Alkaline Phosphatase: 73 U/L (ref 39–117)
BUN: 12 mg/dL (ref 6–23)
Glucose, Bld: 114 mg/dL — ABNORMAL HIGH (ref 70–99)
Total Bilirubin: 0.7 mg/dL (ref 0.3–1.2)

## 2012-10-22 LAB — LIPID PANEL
Cholesterol: 202 mg/dL — ABNORMAL HIGH (ref 0–200)
Total CHOL/HDL Ratio: 6
Triglycerides: 301 mg/dL — ABNORMAL HIGH (ref 0.0–149.0)
VLDL: 60.2 mg/dL — ABNORMAL HIGH (ref 0.0–40.0)

## 2012-10-22 LAB — TSH: TSH: 2.11 u[IU]/mL (ref 0.35–5.50)

## 2012-10-22 MED ORDER — ROSUVASTATIN CALCIUM 5 MG PO TABS: 5.0000 mg | ORAL_TABLET | Freq: Every day | ORAL | Status: DC

## 2012-10-22 MED ORDER — CYANOCOBALAMIN 1000 MCG/ML IJ SOLN
1000.0000 ug | Freq: Once | INTRAMUSCULAR | Status: AC
  Administered 2012-10-22: 1000 ug via INTRAMUSCULAR

## 2012-10-22 NOTE — Assessment & Plan Note (Signed)
She agrees to start crestor

## 2012-10-22 NOTE — Assessment & Plan Note (Signed)
I will check her TSH today and will adjust her dose if needed 

## 2012-10-22 NOTE — Assessment & Plan Note (Signed)
She will start crestor She is working on her lifestyle modifications Will recheck her FLP today

## 2012-10-22 NOTE — Assessment & Plan Note (Signed)
She needs to have an updated evaluation for this

## 2012-10-22 NOTE — Assessment & Plan Note (Signed)
Will recheck her UA and urine clx

## 2012-10-22 NOTE — Patient Instructions (Signed)
Type 2 Diabetes Mellitus, Adult Type 2 diabetes mellitus, often simply referred to as type 2 diabetes, is a long-lasting (chronic) disease. In type 2 diabetes, the pancreas does not make enough insulin (a hormone), the cells are less responsive to the insulin that is made (insulin resistance), or both. Normally, insulin moves sugars from food into the tissue cells. The tissue cells use the sugars for energy. The lack of insulin or the lack of normal response to insulin causes excess sugars to build up in the blood instead of going into the tissue cells. As a result, high blood sugar (hyperglycemia) develops. The effect of high sugar (glucose) levels can cause many complications. Type 2 diabetes was also previously called adult-onset diabetes but it can occur at any age.  RISK FACTORS  A person is predisposed to developing type 2 diabetes if someone in the family has the disease and also has one or more of the following primary risk factors:  Overweight.  An inactive lifestyle.  A history of consistently eating high-calorie foods. Maintaining a normal weight and regular physical activity can reduce the chance of developing type 2 diabetes. SYMPTOMS  A person with type 2 diabetes may not show symptoms initially. The symptoms of type 2 diabetes appear slowly. The symptoms include:  Increased thirst (polydipsia).  Increased urination (polyuria).  Increased urination during the night (nocturia).  Weight loss. This weight loss may be rapid.  Frequent, recurring infections.  Tiredness (fatigue).  Weakness.  Vision changes, such as blurred vision.  Fruity smell to your breath.  Abdominal pain.  Nausea or vomiting.  Cuts or bruises which are slow to heal.  Tingling or numbness in the hands or feet. DIAGNOSIS Type 2 diabetes is frequently not diagnosed until complications of diabetes are present. Type 2 diabetes is diagnosed when symptoms or complications are present and when blood  glucose levels are increased. Your blood glucose level may be checked by one or more of the following blood tests:  A fasting blood glucose test. You will not be allowed to eat for at least 8 hours before a blood sample is taken.  A random blood glucose test. Your blood glucose is checked at any time of the day regardless of when you ate.  A hemoglobin A1c blood glucose test. A hemoglobin A1c test provides information about blood glucose control over the previous 3 months.  An oral glucose tolerance test (OGTT). Your blood glucose is measured after you have not eaten (fasted) for 2 hours and then after you drink a glucose-containing beverage. TREATMENT   You may need to take insulin or diabetes medicine daily to keep blood glucose levels in the desired range.  You will need to match insulin dosing with exercise and healthy food choices. The treatment goal is to maintain the before meal blood sugar (preprandial glucose) level at 70 130 mg/dL. HOME CARE INSTRUCTIONS   Have your hemoglobin A1c level checked twice a year.  Perform daily blood glucose monitoring as directed by your caregiver.  Monitor urine ketones when you are ill and as directed by your caregiver.  Take your diabetes medicine or insulin as directed by your caregiver to maintain your blood glucose levels in the desired range.  Never run out of diabetes medicine or insulin. It is needed every day.  Adjust insulin based on your intake of carbohydrates. Carbohydrates can raise blood glucose levels but need to be included in your diet. Carbohydrates provide vitamins, minerals, and fiber which are an essential part of   a healthy diet. Carbohydrates are found in fruits, vegetables, whole grains, dairy products, legumes, and foods containing added sugars.    Eat healthy foods. Alternate 3 meals with 3 snacks.  Lose weight if overweight.  Carry a medical alert card or wear your medical alert jewelry.  Carry a 15 gram  carbohydrate snack with you at all times to treat low blood glucose (hypoglycemia). Some examples of 15 gram carbohydrate snacks include:  Glucose tablets, 3 or 4   Glucose gel, 15 gram tube  Raisins, 2 tablespoons (24 grams)  Jelly beans, 6  Animal crackers, 8  Regular pop, 4 ounces (120 mL)  Gummy treats, 9  Recognize hypoglycemia. Hypoglycemia occurs with blood glucose levels of 70 mg/dL and below. The risk for hypoglycemia increases when fasting or skipping meals, during or after intense exercise, and during sleep. Hypoglycemia symptoms can include:  Tremors or shakes.  Decreased ability to concentrate.  Sweating.  Increased heart rate.  Headache.  Dry mouth.  Hunger.  Irritability.  Anxiety.  Restless sleep.  Altered speech or coordination.  Confusion.  Treat hypoglycemia promptly. If you are alert and able to safely swallow, follow the 15:15 rule:  Take 15 20 grams of rapid-acting glucose or carbohydrate. Rapid-acting options include glucose gel, glucose tablets, or 4 ounces (120 mL) of fruit juice, regular soda, or low fat milk.  Check your blood glucose level 15 minutes after taking the glucose.  Take 15 20 grams more of glucose if the repeat blood glucose level is still 70 mg/dL or below.  Eat a meal or snack within 1 hour once blood glucose levels return to normal.    Be alert to polyuria and polydipsia which are early signs of hyperglycemia. An early awareness of hyperglycemia allows for prompt treatment. Treat hyperglycemia as directed by your caregiver.  Engage in at least 150 minutes of moderate-intensity physical activity a week, spread over at least 3 days of the week or as directed by your caregiver. In addition, you should engage in resistance exercise at least 2 times a week or as directed by your caregiver.  Adjust your medicine and food intake as needed if you start a new exercise or sport.  Follow your sick day plan at any time you  are unable to eat or drink as usual.  Avoid tobacco use.  Limit alcohol intake to no more than 1 drink per day for nonpregnant women and 2 drinks per day for men. You should drink alcohol only when you are also eating food. Talk with your caregiver whether alcohol is safe for you. Tell your caregiver if you drink alcohol several times a week.  Follow up with your caregiver regularly.  Schedule an eye exam soon after the diagnosis of type 2 diabetes and then annually.  Perform daily skin and foot care. Examine your skin and feet daily for cuts, bruises, redness, nail problems, bleeding, blisters, or sores. A foot exam by a caregiver should be done annually.  Brush your teeth and gums at least twice a day and floss at least once a day. Follow up with your dentist regularly.  Share your diabetes management plan with your workplace or school.  Stay up-to-date with immunizations.  Learn to manage stress.  Obtain ongoing diabetes education and support as needed.  Participate in, or seek rehabilitation as needed to maintain or improve independence and quality of life. Request a physical or occupational therapy referral if you are having foot or hand numbness or difficulties with grooming,   dressing, eating, or physical activity. SEEK MEDICAL CARE IF:   You are unable to eat food or drink fluids for more than 6 hours.  You have nausea and vomiting for more than 6 hours.  Your blood glucose level is over 240 mg/dL.  There is a change in mental status.  You develop an additional serious illness.  You have diarrhea for more than 6 hours.  You have been sick or have had a fever for a couple of days and are not getting better.  You have pain during any physical activity.  SEEK IMMEDIATE MEDICAL CARE IF:  You have difficulty breathing.  You have moderate to large ketone levels. MAKE SURE YOU:  Understand these instructions.  Will watch your condition.  Will get help right away if  you are not doing well or get worse. Document Released: 01/29/2005 Document Revised: 10/24/2011 Document Reviewed: 08/28/2011 [employer] Patient Information 2014 [employer].  

## 2012-10-22 NOTE — Assessment & Plan Note (Signed)
I will check her A1C and will address if needed

## 2012-10-22 NOTE — Assessment & Plan Note (Signed)
Her BP is well controlled Will check lytes and renal function today

## 2012-10-22 NOTE — Progress Notes (Signed)
Subjective:    Patient ID: Kirsten [name], female    DOB: Oct 08, 1955, 57 y.o.   MRN: [id number]  Diabetes She presents for her follow-up diabetic visit. She has type 2 diabetes mellitus. Her disease course has been stable. There are no hypoglycemic associated symptoms. Pertinent negatives for hypoglycemia include no pallor. Pertinent negatives for diabetes include no blurred vision, no chest pain, no fatigue, no foot paresthesias, no foot ulcerations, no polydipsia, no polyphagia, no polyuria, no visual change, no weakness and no weight loss. There are no hypoglycemic complications. There are no diabetic complications. Current diabetic treatment includes oral agent (monotherapy). She is compliant with treatment all of the time. Her weight is stable. She is following a generally healthy diet. Meal planning includes avoidance of concentrated sweets. She participates in exercise intermittently. There is no change in her home blood glucose trend. An ACE inhibitor/angiotensin II receptor blocker is not being taken. She does not see a podiatrist.Eye exam is current.      Review of Systems  Constitutional: Negative.  Negative for fever, chills, weight loss, diaphoresis, activity change, appetite change, fatigue and unexpected weight change.  HENT: Negative.   Eyes: Negative.  Negative for blurred vision.  Respiratory: Positive for apnea. Negative for cough, choking, chest tightness, shortness of breath, wheezing and stridor.   Cardiovascular: Negative.  Negative for chest pain, palpitations and leg swelling.  Gastrointestinal: Negative.  Negative for nausea, vomiting, abdominal pain, diarrhea and constipation.  Endocrine: Negative.  Negative for polydipsia, polyphagia and polyuria.  Genitourinary: Negative.  Negative for dysuria, urgency, frequency, hematuria, flank pain, decreased urine volume and difficulty urinating.  Musculoskeletal: Negative.  Negative for myalgias, back pain, joint swelling and  gait problem.  Skin: Negative.  Negative for color change, pallor, rash and wound.  Allergic/Immunologic: Negative.   Neurological: Negative.  Negative for weakness.  Hematological: Negative.  Negative for adenopathy. Does not bruise/bleed easily.  Psychiatric/Behavioral: Negative.        Objective:   Physical Exam  Vitals reviewed. Constitutional: She is oriented to person, place, and time. She appears well-developed and well-nourished. No distress.  HENT:  Head: Normocephalic and atraumatic.  Mouth/Throat: Oropharynx is clear and moist. No oropharyngeal exudate.  Eyes: Conjunctivae are normal. Right eye exhibits no discharge. Left eye exhibits no discharge. No scleral icterus.  Neck: Normal range of motion. Neck supple. No JVD present. No tracheal deviation present. No thyromegaly present.  Cardiovascular: Normal rate, regular rhythm, normal heart sounds and intact distal pulses.  Exam reveals no gallop and no friction rub.   No murmur heard. Pulmonary/Chest: Effort normal and breath sounds normal. No stridor. No respiratory distress. She has no wheezes. She has no rales. She exhibits no tenderness.  Abdominal: Soft. Bowel sounds are normal. She exhibits no distension and no mass. There is no tenderness. There is no rebound and no guarding.  Musculoskeletal: Normal range of motion. She exhibits no edema and no tenderness.  Lymphadenopathy:    She has no cervical adenopathy.  Neurological: She is oriented to person, place, and time.  Skin: Skin is warm and dry. No rash noted. She is not diaphoretic. No erythema. No pallor.  Psychiatric: She has a normal mood and affect. Her behavior is normal. Judgment and thought content normal.     Lab Results  Component Value Date   WBC 11.4* 07/09/2012   HGB 15.4* 07/09/2012   HCT 44.9 07/09/2012   PLT 280.0 07/09/2012   GLUCOSE 106* 07/09/2012   CHOL 210* 07/09/2012  TRIG 303.0* 07/09/2012   HDL 39.60 07/09/2012   LDLDIRECT 120.2 07/09/2012    LDLCALC 98.8 02/02/2010   ALT 28 07/09/2012   AST 20 07/09/2012   NA 138 07/09/2012   K 3.9 07/09/2012   CL 102 07/09/2012   CREATININE 0.6 07/09/2012   BUN 11 07/09/2012   CO2 27 07/09/2012   TSH 2.08 07/09/2012   HGBA1C 6.6* 07/09/2012   MICROALBUR 2.6* 06/20/2009       Assessment & Plan:

## 2012-10-23 ENCOUNTER — Encounter: Payer: Self-pay | Admitting: Internal Medicine

## 2012-10-23 LAB — LDL CHOLESTEROL, DIRECT: Direct LDL: 127.3 mg/dL

## 2012-11-11 ENCOUNTER — Other Ambulatory Visit: Payer: Self-pay | Admitting: Internal Medicine

## 2012-11-25 ENCOUNTER — Institutional Professional Consult (permissible substitution): Payer: Managed Care, Other (non HMO) | Admitting: Pulmonary Disease

## 2012-12-24 LAB — HM DIABETES EYE EXAM: HM Diabetic Eye Exam: NORMAL

## 2013-01-13 ENCOUNTER — Ambulatory Visit (INDEPENDENT_AMBULATORY_CARE_PROVIDER_SITE_OTHER): Payer: Managed Care, Other (non HMO) | Admitting: Pulmonary Disease

## 2013-01-13 ENCOUNTER — Encounter: Payer: Self-pay | Admitting: Pulmonary Disease

## 2013-01-13 VITALS — BP 142/80 | HR 107 | Temp 98.1°F | Ht 67.0 in | Wt 214.0 lb

## 2013-01-13 DIAGNOSIS — G4733 Obstructive sleep apnea (adult) (pediatric): Secondary | ICD-10-CM

## 2013-01-13 DIAGNOSIS — G4730 Sleep apnea, unspecified: Secondary | ICD-10-CM

## 2013-01-13 NOTE — Assessment & Plan Note (Signed)
The patient has a history of severe obstructive sleep apnea which is being adequately treated by CPAP currently. I would like to see her use her machine on a more consistent basis, and for more hours during the night.  The patient states that she is willing to give her best effort toward this. She would like to try nasal pillows, and I have also asked her to keep up with her mask changes and supplies on a consistent basis. I've also encouraged her to work aggressively on weight loss. She is satisfied with the automatic setting, and it appears to be controlling her sleep apnea well.

## 2013-01-13 NOTE — Patient Instructions (Signed)
Will send an order to your home care company to try nasal pillows. Work on weight loss Try to wear cpap everynight, for the whole night as much as possible followup with me in one year if doing well.

## 2013-01-13 NOTE — Progress Notes (Signed)
Subjective:    Patient ID: Kirsten [name], female    DOB: 1955-02-24, 57 y.o.   MRN: [id number]  HPI The patient is a 57 year old female who I been asked to see for management of obstructive sleep apnea. She was diagnosed in 2010 with severe OSA, with an AHI of 80 events per hour.  She has been on CPAP since that time, and currently is on the automatic setting with excellent control of her events. However, she is only wearing about 50% of nights most recently, and only for 3-1/2 hours each night. The patient is not very happy with her mask currently, and can awaken with the mask pulled off. She feels comfortable with the pressure setting, and wishes to use on a more consistent basis. She feels rested in the mornings upon arising, and states that she is completely satisfied with her alertness during the day. She has no issues in the evenings watching television, and no sleepiness while driving. Her weight is neutral over the last 2 years, and her Epworth score today is normal at 8.   Sleep Questionnaire What time do you typically go to bed?( Between what hours) 10p-11p 10p-11p at 1443 on 01/13/13 by Virl Cagey, CMA How long does it take you to fall asleep? 1hr + 1hr + at 1443 on 01/13/13 by Virl Cagey, CMA How many times during the night do you wake up? 3 3 at 1443 on 01/13/13 by Virl Cagey, CMA What time do you get out of bed to start your day? 0700 0700 at 1443 on 01/13/13 by Virl Cagey, CMA Do you drive or operate heavy machinery in your occupation? No No at 1443 on 01/13/13 by Virl Cagey, CMA How much has your weight changed (up or down) over the past two years? (In pounds) 0 oz (0 kg) 0 oz (0 kg) at 1443 on 01/13/13 by Virl Cagey, CMA Have you ever had a sleep study before? Yes Yes at 1443 on 01/13/13 by Virl Cagey, CMA If yes, location of study? Dr Humphrey Rolls in [facility] Dr Humphrey Rolls in [place] at 1443 on 01/13/13 by Virl Cagey, CMA If yes, date of  study? 2009 2009 at 1443 on 01/13/13 by Virl Cagey, CMA Do you currently use CPAP? Yes Yes at 1443 on 01/13/13 by Virl Cagey, CMA If so, what pressure? 8 8 at 1443 on 01/13/13 by Virl Cagey, CMA Do you wear oxygen at any time? No No at 1443 on 01/13/13 by Virl Cagey, CMA   Review of Systems  Constitutional: Negative for fever and unexpected weight change.  HENT: Negative for congestion, dental problem, ear pain, nosebleeds, postnasal drip, rhinorrhea, sinus pressure, sneezing, sore throat and trouble swallowing.   Eyes: Negative for redness and itching.  Respiratory: Positive for shortness of breath. Negative for cough, chest tightness and wheezing.   Cardiovascular: Negative for palpitations and leg swelling.  Gastrointestinal: Negative for nausea and vomiting.  Genitourinary: Negative for dysuria.  Musculoskeletal: Positive for arthralgias and joint swelling.  Skin: Negative for rash.  Neurological: Positive for headaches.  Hematological: Does not bruise/bleed easily.  Psychiatric/Behavioral: Negative for dysphoric mood. The patient is nervous/anxious.        Objective:   Physical Exam Constitutional:  Overweight female, no acute distress  HENT:  Nares patent without discharge  Oropharynx without exudate, palate and uvula are thick and elongated.   Eyes:  Perrla, eomi, no scleral icterus  Neck:  No  JVD, no TMG  Cardiovascular:  Normal rate, regular rhythm, no rubs or gallops.  No murmurs        Intact distal pulses  Pulmonary :  Normal breath sounds, no stridor or respiratory distress   No rales, rhonchi, or wheezing  Abdominal:  Soft, nondistended, bowel sounds present.  No tenderness noted.   Musculoskeletal:  No lower extremity edema noted.  Lymph Nodes:  No cervical lymphadenopathy noted  Skin:  No cyanosis noted  Neurologic:  Alert, appropriate, moves all 4 extremities without obvious deficit.         Assessment & Plan:

## 2013-01-29 ENCOUNTER — Encounter: Payer: Self-pay | Admitting: Internal Medicine

## 2013-01-29 ENCOUNTER — Ambulatory Visit (INDEPENDENT_AMBULATORY_CARE_PROVIDER_SITE_OTHER): Payer: Managed Care, Other (non HMO) | Admitting: Internal Medicine

## 2013-01-29 ENCOUNTER — Other Ambulatory Visit (INDEPENDENT_AMBULATORY_CARE_PROVIDER_SITE_OTHER): Payer: Managed Care, Other (non HMO)

## 2013-01-29 VITALS — BP 136/88 | HR 94 | Temp 98.6°F | Resp 16 | Ht 67.0 in | Wt 209.0 lb

## 2013-01-29 DIAGNOSIS — F329 Major depressive disorder, single episode, unspecified: Secondary | ICD-10-CM

## 2013-01-29 DIAGNOSIS — E1129 Type 2 diabetes mellitus with other diabetic kidney complication: Secondary | ICD-10-CM

## 2013-01-29 DIAGNOSIS — F3289 Other specified depressive episodes: Secondary | ICD-10-CM

## 2013-01-29 DIAGNOSIS — I10 Essential (primary) hypertension: Secondary | ICD-10-CM

## 2013-01-29 DIAGNOSIS — E039 Hypothyroidism, unspecified: Secondary | ICD-10-CM

## 2013-01-29 DIAGNOSIS — E119 Type 2 diabetes mellitus without complications: Secondary | ICD-10-CM

## 2013-01-29 DIAGNOSIS — E1165 Type 2 diabetes mellitus with hyperglycemia: Secondary | ICD-10-CM

## 2013-01-29 DIAGNOSIS — E785 Hyperlipidemia, unspecified: Secondary | ICD-10-CM

## 2013-01-29 DIAGNOSIS — E538 Deficiency of other specified B group vitamins: Secondary | ICD-10-CM

## 2013-01-29 LAB — HEMOGLOBIN A1C: Hgb A1c MFr Bld: 7 % — ABNORMAL HIGH (ref 4.6–6.5)

## 2013-01-29 LAB — BASIC METABOLIC PANEL
BUN: 9 mg/dL (ref 6–23)
Creatinine, Ser: 0.5 mg/dL (ref 0.4–1.2)
GFR: 132.83 mL/min (ref >60.00)
Potassium: 4 mEq/L (ref 3.5–5.1)
Sodium: 141 mEq/L (ref 135–145)

## 2013-01-29 LAB — TSH: TSH: 1.85 u[IU]/mL (ref 0.35–5.50)

## 2013-01-29 MED ORDER — CYANOCOBALAMIN 1000 MCG/ML IJ SOLN
1000.0000 ug | Freq: Once | INTRAMUSCULAR | Status: AC
  Administered 2013-01-29: 1000 ug via INTRAMUSCULAR

## 2013-01-29 NOTE — Patient Instructions (Signed)
Type 2 Diabetes Mellitus, Adult Type 2 diabetes mellitus, often simply referred to as type 2 diabetes, is a long-lasting (chronic) disease. In type 2 diabetes, the pancreas does not make enough insulin (a hormone), the cells are less responsive to the insulin that is made (insulin resistance), or both. Normally, insulin moves sugars from food into the tissue cells. The tissue cells use the sugars for energy. The lack of insulin or the lack of normal response to insulin causes excess sugars to build up in the blood instead of going into the tissue cells. As a result, high blood sugar (hyperglycemia) develops. The effect of high sugar (glucose) levels can cause many complications. Type 2 diabetes was also previously called adult-onset diabetes but it can occur at any age.  RISK FACTORS  A person is predisposed to developing type 2 diabetes if someone in the family has the disease and also has one or more of the following primary risk factors:  Overweight.  An inactive lifestyle.  A history of consistently eating high-calorie foods. Maintaining a normal weight and regular physical activity can reduce the chance of developing type 2 diabetes. SYMPTOMS  A person with type 2 diabetes may not show symptoms initially. The symptoms of type 2 diabetes appear slowly. The symptoms include:  Increased thirst (polydipsia).  Increased urination (polyuria).  Increased urination during the night (nocturia).  Weight loss. This weight loss may be rapid.  Frequent, recurring infections.  Tiredness (fatigue).  Weakness.  Vision changes, such as blurred vision.  Fruity smell to your breath.  Abdominal pain.  Nausea or vomiting.  Cuts or bruises which are slow to heal.  Tingling or numbness in the hands or feet. DIAGNOSIS Type 2 diabetes is frequently not diagnosed until complications of diabetes are present. Type 2 diabetes is diagnosed when symptoms or complications are present and when blood  glucose levels are increased. Your blood glucose level may be checked by one or more of the following blood tests:  A fasting blood glucose test. You will not be allowed to eat for at least 8 hours before a blood sample is taken.  A random blood glucose test. Your blood glucose is checked at any time of the day regardless of when you ate.  A hemoglobin A1c blood glucose test. A hemoglobin A1c test provides information about blood glucose control over the previous 3 months.  An oral glucose tolerance test (OGTT). Your blood glucose is measured after you have not eaten (fasted) for 2 hours and then after you drink a glucose-containing beverage. TREATMENT   You may need to take insulin or diabetes medicine daily to keep blood glucose levels in the desired range.  You will need to match insulin dosing with exercise and healthy food choices. The treatment goal is to maintain the before meal blood sugar (preprandial glucose) level at 70 130 mg/dL. HOME CARE INSTRUCTIONS   Have your hemoglobin A1c level checked twice a year.  Perform daily blood glucose monitoring as directed by your caregiver.  Monitor urine ketones when you are ill and as directed by your caregiver.  Take your diabetes medicine or insulin as directed by your caregiver to maintain your blood glucose levels in the desired range.  Never run out of diabetes medicine or insulin. It is needed every day.  Adjust insulin based on your intake of carbohydrates. Carbohydrates can raise blood glucose levels but need to be included in your diet. Carbohydrates provide vitamins, minerals, and fiber which are an essential part of   a healthy diet. Carbohydrates are found in fruits, vegetables, whole grains, dairy products, legumes, and foods containing added sugars.    Eat healthy foods. Alternate 3 meals with 3 snacks.  Lose weight if overweight.  Carry a medical alert card or wear your medical alert jewelry.  Carry a 15 gram  carbohydrate snack with you at all times to treat low blood glucose (hypoglycemia). Some examples of 15 gram carbohydrate snacks include:  Glucose tablets, 3 or 4   Glucose gel, 15 gram tube  Raisins, 2 tablespoons (24 grams)  Jelly beans, 6  Animal crackers, 8  Regular pop, 4 ounces (120 mL)  Gummy treats, 9  Recognize hypoglycemia. Hypoglycemia occurs with blood glucose levels of 70 mg/dL and below. The risk for hypoglycemia increases when fasting or skipping meals, during or after intense exercise, and during sleep. Hypoglycemia symptoms can include:  Tremors or shakes.  Decreased ability to concentrate.  Sweating.  Increased heart rate.  Headache.  Dry mouth.  Hunger.  Irritability.  Anxiety.  Restless sleep.  Altered speech or coordination.  Confusion.  Treat hypoglycemia promptly. If you are alert and able to safely swallow, follow the 15:15 rule:  Take 15 20 grams of rapid-acting glucose or carbohydrate. Rapid-acting options include glucose gel, glucose tablets, or 4 ounces (120 mL) of fruit juice, regular soda, or low fat milk.  Check your blood glucose level 15 minutes after taking the glucose.  Take 15 20 grams more of glucose if the repeat blood glucose level is still 70 mg/dL or below.  Eat a meal or snack within 1 hour once blood glucose levels return to normal.    Be alert to polyuria and polydipsia which are early signs of hyperglycemia. An early awareness of hyperglycemia allows for prompt treatment. Treat hyperglycemia as directed by your caregiver.  Engage in at least 150 minutes of moderate-intensity physical activity a week, spread over at least 3 days of the week or as directed by your caregiver. In addition, you should engage in resistance exercise at least 2 times a week or as directed by your caregiver.  Adjust your medicine and food intake as needed if you start a new exercise or sport.  Follow your sick day plan at any time you  are unable to eat or drink as usual.  Avoid tobacco use.  Limit alcohol intake to no more than 1 drink per day for nonpregnant women and 2 drinks per day for men. You should drink alcohol only when you are also eating food. Talk with your caregiver whether alcohol is safe for you. Tell your caregiver if you drink alcohol several times a week.  Follow up with your caregiver regularly.  Schedule an eye exam soon after the diagnosis of type 2 diabetes and then annually.  Perform daily skin and foot care. Examine your skin and feet daily for cuts, bruises, redness, nail problems, bleeding, blisters, or sores. A foot exam by a caregiver should be done annually.  Brush your teeth and gums at least twice a day and floss at least once a day. Follow up with your dentist regularly.  Share your diabetes management plan with your workplace or school.  Stay up-to-date with immunizations.  Learn to manage stress.  Obtain ongoing diabetes education and support as needed.  Participate in, or seek rehabilitation as needed to maintain or improve independence and quality of life. Request a physical or occupational therapy referral if you are having foot or hand numbness or difficulties with grooming,   dressing, eating, or physical activity. SEEK MEDICAL CARE IF:   You are unable to eat food or drink fluids for more than 6 hours.  You have nausea and vomiting for more than 6 hours.  Your blood glucose level is over 240 mg/dL.  There is a change in mental status.  You develop an additional serious illness.  You have diarrhea for more than 6 hours.  You have been sick or have had a fever for a couple of days and are not getting better.  You have pain during any physical activity.  SEEK IMMEDIATE MEDICAL CARE IF:  You have difficulty breathing.  You have moderate to large ketone levels. MAKE SURE YOU:  Understand these instructions.  Will watch your condition.  Will get help right away if  you are not doing well or get worse. Document Released: 01/29/2005 Document Revised: 10/24/2011 Document Reviewed: 08/28/2011 [employer] Patient Information 2014 [employer].  

## 2013-02-01 MED ORDER — ALPRAZOLAM 0.25 MG PO TABS: ORAL_TABLET | ORAL | Status: DC

## 2013-02-01 MED ORDER — HYDROCHLOROTHIAZIDE 25 MG PO TABS: 25.0000 mg | ORAL_TABLET | Freq: Every day | ORAL | Status: DC

## 2013-02-01 MED ORDER — ZOLPIDEM TARTRATE 10 MG PO TABS: ORAL_TABLET | ORAL | Status: DC

## 2013-02-01 MED ORDER — ROSUVASTATIN CALCIUM 5 MG PO TABS: 5.0000 mg | ORAL_TABLET | Freq: Every day | ORAL | Status: DC

## 2013-02-01 MED ORDER — PAROXETINE HCL ER 25 MG PO TB24: ORAL_TABLET | ORAL | Status: DC

## 2013-02-01 MED ORDER — LEVOTHYROXINE SODIUM 75 MCG PO TABS: 75.0000 ug | ORAL_TABLET | Freq: Every day | ORAL | Status: DC

## 2013-02-01 MED ORDER — METFORMIN HCL 1000 MG PO TABS: 1000.0000 mg | ORAL_TABLET | Freq: Two times a day (BID) | ORAL | Status: DC

## 2013-02-01 NOTE — Assessment & Plan Note (Signed)
Her BP is well controlled 

## 2013-02-01 NOTE — Assessment & Plan Note (Signed)
She will cont the current meds for this

## 2013-02-01 NOTE — Progress Notes (Signed)
   Subjective:    Patient ID: Kirsten [name], female    DOB: 1955/10/23, 57 y.o.   MRN: [id number]  Hypertension This is a chronic problem. The current episode started more than 1 year ago. The problem is unchanged. The problem is controlled. Pertinent negatives include no anxiety, chest pain, headaches, malaise/fatigue, neck pain, orthopnea, palpitations, peripheral edema, PND or shortness of breath. Risk factors for coronary artery disease include obesity. Past treatments include diuretics. The current treatment provides moderate improvement. Compliance problems include exercise and diet.  Hypertensive end-organ damage includes a thyroid problem. Identifiable causes of hypertension include sleep apnea.      Review of Systems  Constitutional: Negative.  Negative for fever, chills, malaise/fatigue, diaphoresis, appetite change and fatigue.  HENT: Negative.   Eyes: Negative.   Respiratory: Negative.  Negative for cough, choking, chest tightness, shortness of breath, wheezing and stridor.   Cardiovascular: Negative.  Negative for chest pain, palpitations, orthopnea, leg swelling and PND.  Gastrointestinal: Negative.  Negative for nausea, vomiting, abdominal pain, diarrhea, constipation and blood in stool.  Endocrine: Negative.  Negative for polydipsia, polyphagia and polyuria.  Genitourinary: Negative.   Musculoskeletal: Negative.  Negative for neck pain.  Skin: Negative.   Allergic/Immunologic: Negative.   Neurological: Negative.  Negative for dizziness, syncope, speech difficulty, light-headedness and headaches.  Hematological: Negative.  Negative for adenopathy. Does not bruise/bleed easily.  Psychiatric/Behavioral: Positive for sleep disturbance and dysphoric mood. Negative for suicidal ideas, hallucinations, behavioral problems, confusion, self-injury, decreased concentration and agitation. The patient is nervous/anxious. The patient is not hyperactive.        Objective:   Physical  Exam  Vitals reviewed. Constitutional: She is oriented to person, place, and time. She appears well-developed and well-nourished. No distress.  HENT:  Head: Normocephalic and atraumatic.  Mouth/Throat: Oropharynx is clear and moist. No oropharyngeal exudate.  Eyes: Conjunctivae are normal. Right eye exhibits no discharge. Left eye exhibits no discharge. No scleral icterus.  Neck: Normal range of motion. Neck supple. No JVD present. No tracheal deviation present. No thyromegaly present.  Cardiovascular: Normal rate, regular rhythm and intact distal pulses.  Exam reveals no gallop and no friction rub.   No murmur heard. Pulmonary/Chest: Effort normal and breath sounds normal. No stridor. No respiratory distress. She has no wheezes. She has no rales. She exhibits no tenderness.  Abdominal: Soft. Bowel sounds are normal. She exhibits no distension and no mass. There is no tenderness. There is no rebound and no guarding.  Musculoskeletal: Normal range of motion. She exhibits no edema and no tenderness.  Lymphadenopathy:    She has no cervical adenopathy.  Neurological: She is oriented to person, place, and time.  Skin: Skin is warm and dry. No rash noted. She is not diaphoretic. No erythema. No pallor.  Psychiatric: She has a normal mood and affect. Her behavior is normal. Judgment and thought content normal.          Assessment & Plan:

## 2013-02-01 NOTE — Assessment & Plan Note (Signed)
Her TSH is on the normal range No changes in the dose needed

## 2013-02-01 NOTE — Assessment & Plan Note (Signed)
Her A1C shows good control of her blood sugars

## 2013-02-27 ENCOUNTER — Ambulatory Visit: Payer: Self-pay | Admitting: Obstetrics and Gynecology

## 2013-03-13 ENCOUNTER — Ambulatory Visit: Payer: Self-pay | Admitting: Obstetrics and Gynecology

## 2013-05-13 ENCOUNTER — Other Ambulatory Visit: Payer: Self-pay

## 2013-05-13 DIAGNOSIS — F329 Major depressive disorder, single episode, unspecified: Secondary | ICD-10-CM

## 2013-05-13 DIAGNOSIS — F3289 Other specified depressive episodes: Secondary | ICD-10-CM

## 2013-05-13 DIAGNOSIS — I10 Essential (primary) hypertension: Secondary | ICD-10-CM

## 2013-05-13 DIAGNOSIS — E119 Type 2 diabetes mellitus without complications: Secondary | ICD-10-CM

## 2013-05-13 MED ORDER — ALPRAZOLAM 0.25 MG PO TABS: ORAL_TABLET | ORAL | Status: DC

## 2013-05-13 MED ORDER — ZOLPIDEM TARTRATE 10 MG PO TABS: ORAL_TABLET | ORAL | Status: DC

## 2013-05-13 MED ORDER — METFORMIN HCL 1000 MG PO TABS: 1000.0000 mg | ORAL_TABLET | Freq: Two times a day (BID) | ORAL | Status: DC

## 2013-05-13 NOTE — Telephone Encounter (Signed)
Requests Meloxicam also but do not see this on current or past med list.

## 2013-05-13 NOTE — Telephone Encounter (Signed)
Scripts have been faxed

## 2013-06-02 ENCOUNTER — Other Ambulatory Visit (INDEPENDENT_AMBULATORY_CARE_PROVIDER_SITE_OTHER): Payer: Managed Care, Other (non HMO)

## 2013-06-02 ENCOUNTER — Encounter: Payer: Self-pay | Admitting: Internal Medicine

## 2013-06-02 ENCOUNTER — Ambulatory Visit (INDEPENDENT_AMBULATORY_CARE_PROVIDER_SITE_OTHER): Payer: Managed Care, Other (non HMO) | Admitting: Internal Medicine

## 2013-06-02 VITALS — BP 130/92 | HR 96 | Temp 98.1°F | Resp 16 | Ht 67.0 in | Wt 210.4 lb

## 2013-06-02 DIAGNOSIS — E039 Hypothyroidism, unspecified: Secondary | ICD-10-CM

## 2013-06-02 DIAGNOSIS — I10 Essential (primary) hypertension: Secondary | ICD-10-CM

## 2013-06-02 DIAGNOSIS — E1129 Type 2 diabetes mellitus with other diabetic kidney complication: Secondary | ICD-10-CM

## 2013-06-02 DIAGNOSIS — E1165 Type 2 diabetes mellitus with hyperglycemia: Secondary | ICD-10-CM

## 2013-06-02 DIAGNOSIS — E782 Mixed hyperlipidemia: Secondary | ICD-10-CM

## 2013-06-02 DIAGNOSIS — E538 Deficiency of other specified B group vitamins: Secondary | ICD-10-CM

## 2013-06-02 DIAGNOSIS — J45901 Unspecified asthma with (acute) exacerbation: Secondary | ICD-10-CM

## 2013-06-02 DIAGNOSIS — E785 Hyperlipidemia, unspecified: Secondary | ICD-10-CM

## 2013-06-02 DIAGNOSIS — J4530 Mild persistent asthma, uncomplicated: Secondary | ICD-10-CM

## 2013-06-02 DIAGNOSIS — J45909 Unspecified asthma, uncomplicated: Secondary | ICD-10-CM

## 2013-06-02 LAB — LIPID PANEL
CHOLESTEROL: 180 mg/dL (ref 0–200)
HDL: 37.5 mg/dL — ABNORMAL LOW (ref >39.00)
LDL Cholesterol: 94 mg/dL (ref 0–99)
TRIGLYCERIDES: 244 mg/dL — AB (ref 0.0–149.0)
Total CHOL/HDL Ratio: 5
VLDL: 48.8 mg/dL — ABNORMAL HIGH (ref 0.0–40.0)

## 2013-06-02 LAB — HEMOGLOBIN A1C: Hgb A1c MFr Bld: 6.7 % — ABNORMAL HIGH (ref 4.6–6.5)

## 2013-06-02 LAB — BASIC METABOLIC PANEL
BUN: 10 mg/dL (ref 6–23)
CHLORIDE: 103 meq/L (ref 96–112)
CO2: 27 meq/L (ref 19–32)
Calcium: 10 mg/dL (ref 8.4–10.5)
Creatinine, Ser: 0.6 mg/dL (ref 0.4–1.2)
GFR: 109.98 mL/min (ref >60.00)
GLUCOSE: 156 mg/dL — AB (ref 70–99)
Potassium: 3.9 mEq/L (ref 3.5–5.1)
Sodium: 141 mEq/L (ref 135–145)

## 2013-06-02 LAB — TSH: TSH: 2.44 u[IU]/mL (ref 0.35–5.50)

## 2013-06-02 MED ORDER — ALBUTEROL SULFATE HFA 108 (90 BASE) MCG/ACT IN AERS
2.0000 | INHALATION_SPRAY | Freq: Four times a day (QID) | RESPIRATORY_TRACT | Status: DC | PRN
Start: 2013-06-02 — End: 2013-10-12

## 2013-06-02 MED ORDER — MOMETASONE FURO-FORMOTEROL FUM 200-5 MCG/ACT IN AERO: 2.0000 | INHALATION_SPRAY | Freq: Two times a day (BID) | RESPIRATORY_TRACT | Status: DC

## 2013-06-02 MED ORDER — CYANOCOBALAMIN 1000 MCG/ML IJ SOLN
1000.0000 ug | Freq: Once | INTRAMUSCULAR | Status: AC
  Administered 2013-06-02: 1000 ug via INTRAMUSCULAR

## 2013-06-02 MED ORDER — METHYLPREDNISOLONE ACETATE 80 MG/ML IJ SUSP
120.0000 mg | Freq: Once | INTRAMUSCULAR | Status: AC
  Administered 2013-06-02: 120 mg via INTRAMUSCULAR

## 2013-06-02 NOTE — Assessment & Plan Note (Signed)
She is having a flare of symptoms and has not been using her inhalers I will treat this flare with an injection of depo-medrol IM and have asked her to restart her inhalers

## 2013-06-02 NOTE — Assessment & Plan Note (Signed)
Her TSH is in the normal range so she will stay on the current dose

## 2013-06-02 NOTE — Patient Instructions (Signed)
Asthma, Adult Asthma is a recurring condition in which the airways tighten and narrow. Asthma can make it difficult to breathe. It can cause coughing, wheezing, and shortness of breath. Asthma episodes (also called asthma attacks) range from minor to life-threatening. Asthma cannot be cured, but medicines and lifestyle changes can help control it. CAUSES Asthma is believed to be caused by inherited (genetic) and environmental factors, but its exact cause is unknown. Asthma may be triggered by allergens, lung infections, or irritants in the air. Asthma triggers are different for each person. Common triggers include:   Animal dander.  Dust mites.  Cockroaches.  Pollen from trees or grass.  Mold.  Smoke.  Air pollutants such as dust, household cleaners, hair sprays, aerosol sprays, paint fumes, strong chemicals, or strong odors.  Cold air, weather changes, and winds (which increase molds and pollens in the air).  Strong emotional expressions such as crying or laughing hard.  Stress.  Certain medicines (such as aspirin) or types of drugs (such as beta-blockers).  Sulfites in foods and drinks. Foods and drinks that may contain sulfites include dried fruit, potato chips, and sparkling grape juice.  Infections or inflammatory conditions such as the flu, a cold, or an inflammation of the nasal membranes (rhinitis).  Gastroesophageal reflux disease (GERD).  Exercise or strenuous activity. SYMPTOMS Symptoms may occur immediately after asthma is triggered or many hours later. Symptoms include:  Wheezing.  Excessive nighttime or early morning coughing.  Frequent or severe coughing with a common cold.  Chest tightness.  Shortness of breath. DIAGNOSIS  The diagnosis of asthma is made by a review of your medical history and a physical exam. Tests may also be performed. These may include:  Lung function studies. These tests show how much air you breath in and out.  Allergy  tests.  Imaging tests such as X-rays. TREATMENT  Asthma cannot be cured, but it can usually be controlled. Treatment involves identifying and avoiding your asthma triggers. It also involves medicines. There are 2 classes of medicine used for asthma treatment:   Controller medicines. These prevent asthma symptoms from occurring. They are usually taken every day.  Reliever or rescue medicines. These quickly relieve asthma symptoms. They are used as needed and provide short-term relief. Your health care provider will help you create an asthma action plan. An asthma action plan is a written plan for managing and treating your asthma attacks. It includes a list of your asthma triggers and how they may be avoided. It also includes information on when medicines should be taken and when their dosage should be changed. An action plan may also involve the use of a device called a peak flow meter. A peak flow meter measures how well the lungs are working. It helps you monitor your condition. HOME CARE INSTRUCTIONS   Take medicine as directed by your health care provider. Speak with your health care provider if you have questions about how or when to take the medicines.  Use a peak flow meter as directed by your health care provider. Record and keep track of readings.  Understand and use the action plan to help minimize or stop an asthma attack without needing to seek medical care.  Control your home environment in the following ways to help prevent asthma attacks:  Do not smoke. Avoid being exposed to secondhand smoke.  Change your heating and air conditioning filter regularly.  Limit your use of fireplaces and wood stoves.  Get rid of pests (such as roaches and   mice) and their droppings.  Throw away plants if you see mold on them.  Clean your floors and dust regularly. Use unscented cleaning products.  Try to have someone else vacuum for you regularly. Stay out of rooms while they are being  vacuumed and for a short while afterward. If you vacuum, use a dust mask from a hardware store, a double-layered or microfilter vacuum cleaner bag, or a vacuum cleaner with a HEPA filter.  Replace carpet with wood, tile, or vinyl flooring. Carpet can trap dander and dust.  Use allergy-proof pillows, mattress covers, and box spring covers.  Wash bed sheets and blankets every week in hot water and dry them in a dryer.  Use blankets that are made of polyester or cotton.  Clean bathrooms and kitchens with bleach. If possible, have someone repaint the walls in these rooms with mold-resistant paint. Keep out of the rooms that are being cleaned and painted.  Wash hands frequently. SEEK MEDICAL CARE IF:   You have wheezing, shortness of breath, or a cough even if taking medicine to prevent attacks.  The colored mucus you cough up (sputum) is thicker than usual.  Your sputum changes from clear or white to yellow, green, gray, or bloody.  You have any problems that may be related to the medicines you are taking (such as a rash, itching, swelling, or trouble breathing).  You are using a reliever medicine more than 2 3 times per week.  Your peak flow is still at 50 79% of you personal best after following your action plan for 1 hour. SEEK IMMEDIATE MEDICAL CARE IF:   You seem to be getting worse and are unresponsive to treatment during an asthma attack.  You are short of breath even at rest.  You get short of breath when doing very little physical activity.  You have difficulty eating, drinking, or talking due to asthma symptoms.  You develop chest pain.  You develop a fast heartbeat.  You have a bluish color to your lips or fingernails.  You are lightheaded, dizzy, or faint.  Your peak flow is less than 50% of your personal best.  You have a fever or persistent symptoms for more than 2 3 days.  You have a fever and symptoms suddenly get worse. MAKE SURE YOU:   Understand these  instructions.  Will watch your condition.  Will get help right away if you are not doing well or get worse. Document Released: 01/29/2005 Document Revised: 10/01/2012 Document Reviewed: 08/28/2012 [employer] Patient Information 2014 [employer].  

## 2013-06-02 NOTE — Progress Notes (Signed)
Pre visit review using our clinic review tool, if applicable. No additional management support is needed unless otherwise documented below in the visit note. 

## 2013-06-02 NOTE — Progress Notes (Signed)
Subjective:    Patient ID: Kirsten [name], female    DOB: 1956-01-31, 57 y.o.   MRN: [id number]  Cough This is a new problem. The current episode started in the past 7 days. The problem has been unchanged. The problem occurs every few hours. The cough is non-productive. Associated symptoms include shortness of breath and wheezing. Pertinent negatives include no chest pain, chills, ear congestion, ear pain, fever, headaches, heartburn, hemoptysis, myalgias, nasal congestion, postnasal drip, rash, rhinorrhea, sore throat, sweats or weight loss. The symptoms are aggravated by pollens. She has tried nothing for the symptoms. The treatment provided no relief. Her past medical history is significant for asthma.      Review of Systems  Constitutional: Negative.  Negative for fever, chills, weight loss, diaphoresis, activity change, appetite change, fatigue and unexpected weight change.  HENT: Negative.  Negative for ear pain, postnasal drip, rhinorrhea and sore throat.   Eyes: Negative.   Respiratory: Positive for cough, shortness of breath and wheezing. Negative for apnea, hemoptysis, choking, chest tightness and stridor.   Cardiovascular: Negative.  Negative for chest pain, palpitations and leg swelling.  Gastrointestinal: Negative.  Negative for heartburn, nausea, vomiting, abdominal pain, diarrhea, constipation and blood in stool.  Endocrine: Negative.   Genitourinary: Negative.  Negative for urgency, frequency, hematuria, flank pain and difficulty urinating.  Musculoskeletal: Negative.  Negative for arthralgias, joint swelling, myalgias, neck pain and neck stiffness.  Skin: Negative.  Negative for rash.  Allergic/Immunologic: Negative.   Neurological: Negative.  Negative for headaches.  Hematological: Negative.  Negative for adenopathy. Does not bruise/bleed easily.  Psychiatric/Behavioral: Negative.        Objective:   Physical Exam  Vitals reviewed. Constitutional: She is oriented  to person, place, and time. She appears well-developed and well-nourished.  Non-toxic appearance. She does not have a sickly appearance. She does not appear ill. No distress.  HENT:  Head: Normocephalic and atraumatic.  Mouth/Throat: Oropharynx is clear and moist. No oropharyngeal exudate.  Eyes: Conjunctivae are normal. Right eye exhibits no discharge. Left eye exhibits no discharge. No scleral icterus.  Neck: Normal range of motion. Neck supple. No JVD present. No tracheal deviation present. No thyromegaly present.  Cardiovascular: Normal rate, regular rhythm, normal heart sounds and intact distal pulses.  Exam reveals no gallop and no friction rub.   No murmur heard. Pulmonary/Chest: Effort normal. No accessory muscle usage or stridor. Not tachypneic. No respiratory distress. She has no decreased breath sounds. She has wheezes in the right middle field and the left middle field. She has rhonchi in the right middle field and the left middle field. She has no rales.  Abdominal: Soft. Bowel sounds are normal. She exhibits no distension and no mass. There is no tenderness. There is no rebound and no guarding.  Musculoskeletal: Normal range of motion. She exhibits no edema and no tenderness.  Lymphadenopathy:    She has no cervical adenopathy.  Neurological: She is oriented to person, place, and time.  Skin: Skin is warm and dry. No rash noted. She is not diaphoretic. No erythema. No pallor.  Psychiatric: She has a normal mood and affect. Her behavior is normal. Judgment and thought content normal.     Lab Results  Component Value Date   WBC 9.4 10/22/2012   HGB 14.6 10/22/2012   HCT 42.8 10/22/2012   PLT 276.0 10/22/2012   GLUCOSE 98 01/29/2013   CHOL 202* 10/22/2012   TRIG 301.0* 10/22/2012   HDL 36.70* 10/22/2012   LDLDIRECT  127.3 10/22/2012   LDLCALC 98.8 02/02/2010   ALT 29 10/22/2012   AST 22 10/22/2012   NA 141 01/29/2013   K 4.0 01/29/2013   CL 106 01/29/2013   CREATININE 0.5  01/29/2013   BUN 9 01/29/2013   CO2 28 01/29/2013   TSH 1.85 01/29/2013   HGBA1C 7.0* 01/29/2013   MICROALBUR 2.6* 06/20/2009       Assessment & Plan:

## 2013-06-02 NOTE — Assessment & Plan Note (Signed)
She has achieved her LD goal

## 2013-06-02 NOTE — Assessment & Plan Note (Signed)
Her blood sugars are well controlled

## 2013-06-02 NOTE — Assessment & Plan Note (Signed)
Her BP is adequately well controlled Her lytes and renal function are stable

## 2013-06-03 ENCOUNTER — Encounter: Payer: Self-pay | Admitting: Internal Medicine

## 2013-06-03 MED ORDER — GLUCOSE BLOOD VI STRP: ORAL_STRIP | Status: DC

## 2013-06-03 NOTE — Telephone Encounter (Signed)
Done, sent to [facility] home delivery

## 2013-06-30 ENCOUNTER — Encounter: Payer: Self-pay | Admitting: Internal Medicine

## 2013-07-07 ENCOUNTER — Ambulatory Visit: Payer: Self-pay | Admitting: Surgery

## 2013-07-07 LAB — RENAL FUNCTION PANEL
Albumin: 3.8 g/dL (ref 3.4–5.0)
Anion Gap: 12 (ref 7–16)
BUN: 11 mg/dL (ref 7–18)
CHLORIDE: 102 mmol/L (ref 98–107)
CO2: 27 mmol/L (ref 21–32)
CREATININE: 0.69 mg/dL (ref 0.60–1.30)
Calcium, Total: 9.3 mg/dL (ref 8.5–10.1)
EGFR (Non-African Amer.): >60
Glucose: 183 mg/dL — ABNORMAL HIGH (ref 65–99)
Osmolality: 285 (ref 275–301)
Phosphorus: 3.1 mg/dL (ref 2.5–4.9)
Potassium: 3.7 mmol/L (ref 3.5–5.1)
Sodium: 141 mmol/L (ref 136–145)

## 2013-07-10 ENCOUNTER — Ambulatory Visit: Payer: Self-pay | Admitting: Surgery

## 2013-08-26 LAB — HM DIABETES EYE EXAM

## 2013-09-10 ENCOUNTER — Other Ambulatory Visit: Payer: Self-pay

## 2013-09-10 DIAGNOSIS — F329 Major depressive disorder, single episode, unspecified: Secondary | ICD-10-CM

## 2013-09-10 DIAGNOSIS — F3289 Other specified depressive episodes: Secondary | ICD-10-CM

## 2013-09-10 DIAGNOSIS — E039 Hypothyroidism, unspecified: Secondary | ICD-10-CM

## 2013-09-10 MED ORDER — ZOLPIDEM TARTRATE 10 MG PO TABS: ORAL_TABLET | ORAL | Status: DC

## 2013-09-10 MED ORDER — ALPRAZOLAM 0.25 MG PO TABS: ORAL_TABLET | ORAL | Status: DC

## 2013-09-23 DIAGNOSIS — F329 Major depressive disorder, single episode, unspecified: Secondary | ICD-10-CM

## 2013-09-23 DIAGNOSIS — E039 Hypothyroidism, unspecified: Secondary | ICD-10-CM

## 2013-09-23 DIAGNOSIS — F3289 Other specified depressive episodes: Secondary | ICD-10-CM

## 2013-09-28 DIAGNOSIS — M5412 Radiculopathy, cervical region: Secondary | ICD-10-CM | POA: Diagnosis not present

## 2013-09-28 DIAGNOSIS — M999 Biomechanical lesion, unspecified: Secondary | ICD-10-CM | POA: Diagnosis not present

## 2013-09-28 DIAGNOSIS — M9981 Other biomechanical lesions of cervical region: Secondary | ICD-10-CM | POA: Diagnosis not present

## 2013-09-28 DIAGNOSIS — R51 Headache: Secondary | ICD-10-CM | POA: Diagnosis not present

## 2013-09-29 MED ORDER — LEVOTHYROXINE SODIUM 75 MCG PO TABS: 75.0000 ug | ORAL_TABLET | Freq: Every day | ORAL | Status: DC

## 2013-09-29 MED ORDER — PAROXETINE HCL ER 25 MG PO TB24: ORAL_TABLET | ORAL | Status: DC

## 2013-09-30 ENCOUNTER — Ambulatory Visit: Payer: Managed Care, Other (non HMO) | Admitting: Internal Medicine

## 2013-10-09 DIAGNOSIS — M999 Biomechanical lesion, unspecified: Secondary | ICD-10-CM | POA: Diagnosis not present

## 2013-10-09 DIAGNOSIS — M5412 Radiculopathy, cervical region: Secondary | ICD-10-CM | POA: Diagnosis not present

## 2013-10-09 DIAGNOSIS — M9981 Other biomechanical lesions of cervical region: Secondary | ICD-10-CM | POA: Diagnosis not present

## 2013-10-09 DIAGNOSIS — R51 Headache: Secondary | ICD-10-CM | POA: Diagnosis not present

## 2013-10-12 ENCOUNTER — Other Ambulatory Visit (INDEPENDENT_AMBULATORY_CARE_PROVIDER_SITE_OTHER): Payer: Managed Care, Other (non HMO)

## 2013-10-12 ENCOUNTER — Ambulatory Visit (INDEPENDENT_AMBULATORY_CARE_PROVIDER_SITE_OTHER)
Admission: RE | Admit: 2013-10-12 | Discharge: 2013-10-12 | Disposition: A | Discharge status: Home / Self Care | Payer: Managed Care, Other (non HMO) | Source: Ambulatory Visit | Attending: Internal Medicine | Admitting: Internal Medicine

## 2013-10-12 ENCOUNTER — Ambulatory Visit (INDEPENDENT_AMBULATORY_CARE_PROVIDER_SITE_OTHER): Payer: Managed Care, Other (non HMO) | Admitting: Internal Medicine

## 2013-10-12 ENCOUNTER — Encounter: Payer: Self-pay | Admitting: Internal Medicine

## 2013-10-12 VITALS — BP 120/80 | HR 80 | Temp 98.5°F | Resp 16 | Ht 67.0 in | Wt 211.0 lb

## 2013-10-12 DIAGNOSIS — M542 Cervicalgia: Secondary | ICD-10-CM

## 2013-10-12 DIAGNOSIS — E1165 Type 2 diabetes mellitus with hyperglycemia: Secondary | ICD-10-CM

## 2013-10-12 DIAGNOSIS — E039 Hypothyroidism, unspecified: Secondary | ICD-10-CM

## 2013-10-12 DIAGNOSIS — E538 Deficiency of other specified B group vitamins: Secondary | ICD-10-CM

## 2013-10-12 DIAGNOSIS — E785 Hyperlipidemia, unspecified: Secondary | ICD-10-CM | POA: Diagnosis not present

## 2013-10-12 DIAGNOSIS — E1129 Type 2 diabetes mellitus with other diabetic kidney complication: Secondary | ICD-10-CM

## 2013-10-12 DIAGNOSIS — I10 Essential (primary) hypertension: Secondary | ICD-10-CM | POA: Diagnosis not present

## 2013-10-12 LAB — HEMOGLOBIN A1C: Hgb A1c MFr Bld: 6 % (ref 4.6–6.5)

## 2013-10-12 LAB — COMPREHENSIVE METABOLIC PANEL
ALK PHOS: 70 U/L (ref 39–117)
ALT: 25 U/L (ref 0–35)
AST: 24 U/L (ref 0–37)
Albumin: 4.3 g/dL (ref 3.5–5.2)
BILIRUBIN TOTAL: 0.7 mg/dL (ref 0.2–1.2)
BUN: 9 mg/dL (ref 6–23)
CO2: 25 mEq/L (ref 19–32)
CREATININE: 0.6 mg/dL (ref 0.4–1.2)
Calcium: 10.1 mg/dL (ref 8.4–10.5)
Chloride: 103 mEq/L (ref 96–112)
GFR: 105.76 mL/min (ref >60.00)
Glucose, Bld: 140 mg/dL — ABNORMAL HIGH (ref 70–99)
Potassium: 4.2 mEq/L (ref 3.5–5.1)
Sodium: 140 mEq/L (ref 135–145)
Total Protein: 7.5 g/dL (ref 6.0–8.3)

## 2013-10-12 LAB — CBC WITH DIFFERENTIAL/PLATELET
BASOS ABS: 0 10*3/uL (ref 0.0–0.1)
BASOS PCT: 0.6 % (ref 0.0–3.0)
Eosinophils Absolute: 0.2 10*3/uL (ref 0.0–0.7)
Eosinophils Relative: 2 % (ref 0.0–5.0)
HCT: 44.2 % (ref 36.0–46.0)
HEMOGLOBIN: 14.9 g/dL (ref 12.0–15.0)
Lymphocytes Relative: 46.9 % — ABNORMAL HIGH (ref 12.0–46.0)
Lymphs Abs: 3.9 10*3/uL (ref 0.7–4.0)
MCHC: 33.7 g/dL (ref 30.0–36.0)
MCV: 88.2 fl (ref 78.0–100.0)
MONO ABS: 0.4 10*3/uL (ref 0.1–1.0)
Monocytes Relative: 5.5 % (ref 3.0–12.0)
Neutro Abs: 3.7 10*3/uL (ref 1.4–7.7)
Neutrophils Relative %: 45 % (ref 43.0–77.0)
Platelets: 280 10*3/uL (ref 150.0–400.0)
RBC: 5.01 Mil/uL (ref 3.87–5.11)
RDW: 13.7 % (ref 11.5–15.5)
WBC: 8.2 10*3/uL (ref 4.0–10.5)

## 2013-10-12 LAB — TSH: TSH: 2 u[IU]/mL (ref 0.35–4.50)

## 2013-10-12 LAB — URINALYSIS, ROUTINE W REFLEX MICROSCOPIC
BILIRUBIN URINE: NEGATIVE
HGB URINE DIPSTICK: NEGATIVE
Ketones, ur: NEGATIVE
Leukocytes, UA: NEGATIVE
Nitrite: NEGATIVE
PH: 5.5 (ref 5.0–8.0)
RBC / HPF: NONE SEEN (ref 0–?)
Specific Gravity, Urine: <=1.005 — AB (ref 1.000–1.030)
TOTAL PROTEIN, URINE-UPE24: NEGATIVE
Urine Glucose: NEGATIVE
Urobilinogen, UA: 0.2 (ref 0.0–1.0)

## 2013-10-12 LAB — MICROALBUMIN / CREATININE URINE RATIO
Creatinine,U: 38.5 mg/dL
MICROALB UR: 1 mg/dL (ref 0.0–1.9)
Microalb Creat Ratio: 2.6 mg/g (ref 0.0–30.0)

## 2013-10-12 MED ORDER — CYANOCOBALAMIN 1000 MCG/ML IJ SOLN: 1000.0000 ug | Freq: Once | INTRAMUSCULAR | Status: DC

## 2013-10-12 NOTE — Assessment & Plan Note (Signed)
She has stopped taking crestor and does not want to start another statin at this time

## 2013-10-12 NOTE — Assessment & Plan Note (Signed)
Her blood sugars have been well controlled I will recheck her A1C and will monitor her renal function

## 2013-10-12 NOTE — Progress Notes (Signed)
Subjective:    Patient ID: Kirsten [name], female    DOB: Oct 01, 1955, 58 y.o.   MRN: [id number]  Neck Pain  This is a chronic problem. The current episode started more than 1 year ago. The problem occurs constantly. The problem has been gradually worsening. The pain is associated with nothing. The pain is present in the left side. The quality of the pain is described as aching. The pain is at a severity of 2/10. The pain is mild. Nothing aggravates the symptoms. The pain is worse during the day. Pertinent negatives include no chest pain, fever, headaches, leg pain, numbness, pain with swallowing, paresis, photophobia, syncope, tingling, trouble swallowing, visual change, weakness or weight loss. She has tried NSAIDs for the symptoms. The treatment provided mild relief.      Review of Systems  Constitutional: Negative.  Negative for fever, chills, weight loss, diaphoresis, appetite change and fatigue.  HENT: Negative.  Negative for trouble swallowing.   Eyes: Negative.  Negative for photophobia.  Respiratory: Negative.  Negative for cough, choking, chest tightness, shortness of breath and stridor.   Cardiovascular: Negative.  Negative for chest pain, palpitations, leg swelling and syncope.  Gastrointestinal: Negative.  Negative for nausea, vomiting, abdominal pain, diarrhea, constipation and blood in stool.  Endocrine: Negative.  Negative for polydipsia, polyphagia and polyuria.  Genitourinary: Negative.   Musculoskeletal: Positive for neck pain. Negative for arthralgias, back pain, gait problem, joint swelling, myalgias and neck stiffness.  Skin: Negative.   Allergic/Immunologic: Negative.   Neurological: Negative.  Negative for dizziness, tingling, tremors, seizures, weakness, light-headedness, numbness and headaches.  Hematological: Negative.  Negative for adenopathy. Does not bruise/bleed easily.  Psychiatric/Behavioral: Negative.        Objective:   Physical Exam  Vitals  reviewed. Constitutional: She is oriented to person, place, and time. She appears well-developed and well-nourished. No distress.  HENT:  Head: Normocephalic and atraumatic.  Mouth/Throat: Oropharynx is clear and moist. No oropharyngeal exudate.  Eyes: Conjunctivae are normal. Right eye exhibits no discharge. Left eye exhibits no discharge. No scleral icterus.  Neck: Normal range of motion. Neck supple. No JVD present. No tracheal deviation present. No mass and no thyromegaly present.  Cardiovascular: Normal rate, regular rhythm, normal heart sounds and intact distal pulses.  Exam reveals no gallop and no friction rub.   No murmur heard. Pulmonary/Chest: Effort normal and breath sounds normal. No stridor. No respiratory distress. She has no wheezes. She has no rales. She exhibits no tenderness.  Abdominal: Soft. Bowel sounds are normal. She exhibits no distension and no mass. There is no tenderness. There is no rebound and no guarding.  Musculoskeletal: Normal range of motion. She exhibits no edema and no tenderness.       Cervical back: Normal. She exhibits normal range of motion, no tenderness, no bony tenderness, no swelling, no edema, no deformity, no laceration, no pain, no spasm and normal pulse.  Lymphadenopathy:    She has no cervical adenopathy.  Neurological: She is alert and oriented to person, place, and time. She has normal strength and normal reflexes. She displays no atrophy, no tremor and normal reflexes. No cranial nerve deficit or sensory deficit. She exhibits normal muscle tone. She displays a negative Romberg sign. She displays no seizure activity. Coordination and gait normal.  Skin: Skin is warm and dry. No rash noted. She is not diaphoretic. No erythema. No pallor.    Lab Results  Component Value Date   WBC 9.4 10/22/2012   HGB  14.6 10/22/2012   HCT 42.8 10/22/2012   PLT 276.0 10/22/2012   GLUCOSE 156* 06/02/2013   CHOL 180 06/02/2013   TRIG 244.0* 06/02/2013   HDL 37.50*  06/02/2013   LDLDIRECT 127.3 10/22/2012   LDLCALC 94 06/02/2013   ALT 29 10/22/2012   AST 22 10/22/2012   NA 141 06/02/2013   K 3.9 06/02/2013   CL 103 06/02/2013   CREATININE 0.6 06/02/2013   BUN 10 06/02/2013   CO2 27 06/02/2013   TSH 2.44 06/02/2013   HGBA1C 6.7* 06/02/2013   MICROALBUR 2.6* 06/20/2009        Assessment & Plan:

## 2013-10-12 NOTE — Assessment & Plan Note (Signed)
Her BP is well controlled Will monitor her lytes and renal function 

## 2013-10-12 NOTE — Progress Notes (Signed)
Pre visit review using our clinic review tool, if applicable. No additional management support is needed unless otherwise documented below in the visit note. 

## 2013-10-12 NOTE — Assessment & Plan Note (Signed)
I will recheck her TSH and will adjust her dose if needed 

## 2013-10-12 NOTE — Assessment & Plan Note (Signed)
Her film shows spinal stenosis so I have asked her to see pain management

## 2013-10-12 NOTE — Patient Instructions (Signed)

## 2013-10-13 ENCOUNTER — Encounter: Payer: Self-pay | Admitting: Internal Medicine

## 2013-10-20 ENCOUNTER — Other Ambulatory Visit: Payer: Self-pay

## 2013-10-20 DIAGNOSIS — E039 Hypothyroidism, unspecified: Secondary | ICD-10-CM

## 2013-10-20 DIAGNOSIS — M6200 Separation of muscle (nontraumatic), unspecified site: Secondary | ICD-10-CM | POA: Diagnosis not present

## 2013-10-20 MED ORDER — LEVOTHYROXINE SODIUM 75 MCG PO TABS: 75.0000 ug | ORAL_TABLET | Freq: Every day | ORAL | Status: DC

## 2013-10-27 DIAGNOSIS — M6200 Separation of muscle (nontraumatic), unspecified site: Secondary | ICD-10-CM | POA: Diagnosis not present

## 2013-10-30 DIAGNOSIS — M9981 Other biomechanical lesions of cervical region: Secondary | ICD-10-CM | POA: Diagnosis not present

## 2013-10-30 DIAGNOSIS — M999 Biomechanical lesion, unspecified: Secondary | ICD-10-CM | POA: Diagnosis not present

## 2013-10-30 DIAGNOSIS — R51 Headache: Secondary | ICD-10-CM | POA: Diagnosis not present

## 2013-10-30 DIAGNOSIS — M5412 Radiculopathy, cervical region: Secondary | ICD-10-CM | POA: Diagnosis not present

## 2013-11-04 DIAGNOSIS — M999 Biomechanical lesion, unspecified: Secondary | ICD-10-CM | POA: Diagnosis not present

## 2013-11-04 DIAGNOSIS — M9981 Other biomechanical lesions of cervical region: Secondary | ICD-10-CM | POA: Diagnosis not present

## 2013-11-04 DIAGNOSIS — M5412 Radiculopathy, cervical region: Secondary | ICD-10-CM | POA: Diagnosis not present

## 2013-11-04 DIAGNOSIS — R51 Headache: Secondary | ICD-10-CM | POA: Diagnosis not present

## 2013-11-25 LAB — HM PAP SMEAR: HM Pap smear: NORMAL

## 2013-11-26 ENCOUNTER — Inpatient Hospital Stay: Payer: Self-pay | Admitting: Internal Medicine

## 2013-11-26 DIAGNOSIS — I10 Essential (primary) hypertension: Secondary | ICD-10-CM | POA: Diagnosis not present

## 2013-11-26 DIAGNOSIS — J8410 Pulmonary fibrosis, unspecified: Secondary | ICD-10-CM | POA: Diagnosis not present

## 2013-11-26 DIAGNOSIS — E785 Hyperlipidemia, unspecified: Secondary | ICD-10-CM | POA: Diagnosis present

## 2013-11-26 DIAGNOSIS — Z888 Allergy status to other drugs, medicaments and biological substances status: Secondary | ICD-10-CM | POA: Diagnosis not present

## 2013-11-26 DIAGNOSIS — M542 Cervicalgia: Secondary | ICD-10-CM | POA: Diagnosis not present

## 2013-11-26 DIAGNOSIS — R000 Tachycardia, unspecified: Secondary | ICD-10-CM | POA: Diagnosis not present

## 2013-11-26 DIAGNOSIS — Z9851 Tubal ligation status: Secondary | ICD-10-CM | POA: Diagnosis not present

## 2013-11-26 DIAGNOSIS — I471 Supraventricular tachycardia: Secondary | ICD-10-CM | POA: Diagnosis not present

## 2013-11-26 DIAGNOSIS — Z880 Allergy status to penicillin: Secondary | ICD-10-CM | POA: Diagnosis not present

## 2013-11-26 DIAGNOSIS — E119 Type 2 diabetes mellitus without complications: Secondary | ICD-10-CM | POA: Diagnosis not present

## 2013-11-26 DIAGNOSIS — F419 Anxiety disorder, unspecified: Secondary | ICD-10-CM | POA: Diagnosis present

## 2013-11-26 DIAGNOSIS — Z9049 Acquired absence of other specified parts of digestive tract: Secondary | ICD-10-CM | POA: Diagnosis present

## 2013-11-26 DIAGNOSIS — Z807 Family history of other malignant neoplasms of lymphoid, hematopoietic and related tissues: Secondary | ICD-10-CM | POA: Diagnosis not present

## 2013-11-26 DIAGNOSIS — I480 Paroxysmal atrial fibrillation: Secondary | ICD-10-CM | POA: Diagnosis not present

## 2013-11-26 DIAGNOSIS — Z79899 Other long term (current) drug therapy: Secondary | ICD-10-CM | POA: Diagnosis not present

## 2013-11-26 DIAGNOSIS — I4891 Unspecified atrial fibrillation: Secondary | ICD-10-CM | POA: Diagnosis not present

## 2013-11-26 DIAGNOSIS — Z833 Family history of diabetes mellitus: Secondary | ICD-10-CM | POA: Diagnosis not present

## 2013-11-26 DIAGNOSIS — Z882 Allergy status to sulfonamides status: Secondary | ICD-10-CM | POA: Diagnosis not present

## 2013-11-26 DIAGNOSIS — I959 Hypotension, unspecified: Secondary | ICD-10-CM | POA: Diagnosis not present

## 2013-11-26 DIAGNOSIS — G4733 Obstructive sleep apnea (adult) (pediatric): Secondary | ICD-10-CM | POA: Diagnosis not present

## 2013-11-26 DIAGNOSIS — E039 Hypothyroidism, unspecified: Secondary | ICD-10-CM | POA: Diagnosis not present

## 2013-11-26 LAB — BASIC METABOLIC PANEL
Anion Gap: 13 (ref 7–16)
BUN: 11 mg/dL (ref 7–18)
CO2: 24 mmol/L (ref 21–32)
Calcium, Total: 9.2 mg/dL (ref 8.5–10.1)
Chloride: 101 mmol/L (ref 98–107)
Creatinine: 0.81 mg/dL (ref 0.60–1.30)
GLUCOSE: 220 mg/dL — AB (ref 65–99)
OSMOLALITY: 282 (ref 275–301)
Potassium: 3.6 mmol/L (ref 3.5–5.1)
Sodium: 138 mmol/L (ref 136–145)

## 2013-11-26 LAB — CBC
HCT: 48 % — ABNORMAL HIGH (ref 35.0–47.0)
HGB: 15.7 g/dL (ref 12.0–16.0)
MCH: 28.8 pg (ref 26.0–34.0)
MCHC: 32.7 g/dL (ref 32.0–36.0)
MCV: 88 fL (ref 80–100)
Platelet: 319 10*3/uL (ref 150–440)
RBC: 5.46 10*6/uL — AB (ref 3.80–5.20)
RDW: 13.5 % (ref 11.5–14.5)
WBC: 8.8 10*3/uL (ref 3.6–11.0)

## 2013-11-26 LAB — TROPONIN I: Troponin-I: 0.03 ng/mL

## 2013-11-26 LAB — HEMOGLOBIN A1C: Hemoglobin A1C: 7.2 % — ABNORMAL HIGH (ref 4.2–6.3)

## 2013-11-26 LAB — LIPID PANEL
CHOLESTEROL: 190 mg/dL (ref 0–200)
HDL Cholesterol: 31 mg/dL — ABNORMAL LOW (ref 40–60)
TRIGLYCERIDES: 430 mg/dL — AB (ref 0–200)

## 2013-11-26 LAB — TSH: Thyroid Stimulating Horm: 3.07 u[IU]/mL

## 2013-11-26 LAB — PRO B NATRIURETIC PEPTIDE: B-TYPE NATIURETIC PEPTID: 33 pg/mL (ref 0–125)

## 2013-11-26 LAB — MAGNESIUM: MAGNESIUM: 1.6 mg/dL — AB

## 2013-11-26 LAB — PROTIME-INR
INR: 1
Prothrombin Time: 12.8 secs (ref 11.5–14.7)

## 2013-11-27 LAB — CBC WITH DIFFERENTIAL/PLATELET
BASOS ABS: 0.1 10*3/uL (ref 0.0–0.1)
Basophil %: 0.8 %
EOS PCT: 2.4 %
Eosinophil #: 0.2 10*3/uL (ref 0.0–0.7)
HCT: 39.6 % (ref 35.0–47.0)
HGB: 12.8 g/dL (ref 12.0–16.0)
LYMPHS PCT: 45.7 %
Lymphocyte #: 3.7 10*3/uL — ABNORMAL HIGH (ref 1.0–3.6)
MCH: 28.5 pg (ref 26.0–34.0)
MCHC: 32.2 g/dL (ref 32.0–36.0)
MCV: 88 fL (ref 80–100)
Monocyte #: 0.6 x10 3/mm (ref 0.2–0.9)
Monocyte %: 7.1 %
Neutrophil #: 3.5 10*3/uL (ref 1.4–6.5)
Neutrophil %: 44 %
Platelet: 240 10*3/uL (ref 150–440)
RBC: 4.48 10*6/uL (ref 3.80–5.20)
RDW: 13.7 % (ref 11.5–14.5)
WBC: 8 10*3/uL (ref 3.6–11.0)

## 2013-11-27 LAB — BASIC METABOLIC PANEL
ANION GAP: 7 (ref 7–16)
BUN: 7 mg/dL (ref 7–18)
CHLORIDE: 107 mmol/L (ref 98–107)
Calcium, Total: 8.2 mg/dL — ABNORMAL LOW (ref 8.5–10.1)
Co2: 28 mmol/L (ref 21–32)
Creatinine: 0.6 mg/dL (ref 0.60–1.30)
Glucose: 144 mg/dL — ABNORMAL HIGH (ref 65–99)
Osmolality: 284 (ref 275–301)
Potassium: 3.1 mmol/L — ABNORMAL LOW (ref 3.5–5.1)
Sodium: 142 mmol/L (ref 136–145)

## 2013-11-27 LAB — PROTIME-INR
INR: 1.1
Prothrombin Time: 14.5 secs (ref 11.5–14.7)

## 2013-11-27 LAB — POTASSIUM: POTASSIUM: 4.5 mmol/L (ref 3.5–5.1)

## 2013-11-30 DIAGNOSIS — E785 Hyperlipidemia, unspecified: Secondary | ICD-10-CM | POA: Diagnosis not present

## 2013-11-30 DIAGNOSIS — I10 Essential (primary) hypertension: Secondary | ICD-10-CM | POA: Diagnosis not present

## 2013-11-30 DIAGNOSIS — R079 Chest pain, unspecified: Secondary | ICD-10-CM | POA: Diagnosis not present

## 2013-11-30 DIAGNOSIS — E139 Other specified diabetes mellitus without complications: Secondary | ICD-10-CM | POA: Diagnosis not present

## 2013-11-30 DIAGNOSIS — G4733 Obstructive sleep apnea (adult) (pediatric): Secondary | ICD-10-CM | POA: Diagnosis not present

## 2013-11-30 DIAGNOSIS — I4891 Unspecified atrial fibrillation: Secondary | ICD-10-CM | POA: Diagnosis not present

## 2013-11-30 DIAGNOSIS — R0602 Shortness of breath: Secondary | ICD-10-CM | POA: Diagnosis not present

## 2013-12-03 ENCOUNTER — Telehealth: Payer: Self-pay

## 2013-12-03 DIAGNOSIS — M9901 Segmental and somatic dysfunction of cervical region: Secondary | ICD-10-CM | POA: Diagnosis not present

## 2013-12-03 DIAGNOSIS — R51 Headache: Secondary | ICD-10-CM | POA: Diagnosis not present

## 2013-12-03 DIAGNOSIS — M9903 Segmental and somatic dysfunction of lumbar region: Secondary | ICD-10-CM | POA: Diagnosis not present

## 2013-12-03 DIAGNOSIS — M5010 Cervical disc disorder with radiculopathy, unspecified cervical region: Secondary | ICD-10-CM | POA: Diagnosis not present

## 2013-12-03 NOTE — Telephone Encounter (Signed)
Called in [place] for Paxil   Paxil Approved.   PA faxed to [facility]   # [id number]

## 2013-12-08 DIAGNOSIS — R079 Chest pain, unspecified: Secondary | ICD-10-CM | POA: Diagnosis not present

## 2013-12-10 DIAGNOSIS — I10 Essential (primary) hypertension: Secondary | ICD-10-CM | POA: Diagnosis not present

## 2013-12-10 DIAGNOSIS — R55 Syncope and collapse: Secondary | ICD-10-CM | POA: Diagnosis not present

## 2013-12-10 DIAGNOSIS — K219 Gastro-esophageal reflux disease without esophagitis: Secondary | ICD-10-CM | POA: Diagnosis not present

## 2013-12-10 DIAGNOSIS — I4891 Unspecified atrial fibrillation: Secondary | ICD-10-CM | POA: Diagnosis not present

## 2013-12-10 DIAGNOSIS — G4733 Obstructive sleep apnea (adult) (pediatric): Secondary | ICD-10-CM | POA: Diagnosis not present

## 2013-12-10 DIAGNOSIS — I340 Nonrheumatic mitral (valve) insufficiency: Secondary | ICD-10-CM | POA: Diagnosis not present

## 2013-12-14 DIAGNOSIS — I10 Essential (primary) hypertension: Secondary | ICD-10-CM | POA: Diagnosis not present

## 2013-12-14 DIAGNOSIS — I4891 Unspecified atrial fibrillation: Secondary | ICD-10-CM | POA: Diagnosis not present

## 2013-12-14 DIAGNOSIS — I340 Nonrheumatic mitral (valve) insufficiency: Secondary | ICD-10-CM | POA: Diagnosis not present

## 2013-12-14 DIAGNOSIS — K219 Gastro-esophageal reflux disease without esophagitis: Secondary | ICD-10-CM | POA: Diagnosis not present

## 2013-12-14 DIAGNOSIS — G4733 Obstructive sleep apnea (adult) (pediatric): Secondary | ICD-10-CM | POA: Diagnosis not present

## 2013-12-14 DIAGNOSIS — R55 Syncope and collapse: Secondary | ICD-10-CM | POA: Diagnosis not present

## 2013-12-17 DIAGNOSIS — I4891 Unspecified atrial fibrillation: Secondary | ICD-10-CM | POA: Diagnosis not present

## 2013-12-18 ENCOUNTER — Encounter: Payer: Self-pay | Admitting: Physical Medicine & Rehabilitation

## 2013-12-22 DIAGNOSIS — I340 Nonrheumatic mitral (valve) insufficiency: Secondary | ICD-10-CM | POA: Diagnosis not present

## 2013-12-22 DIAGNOSIS — G4733 Obstructive sleep apnea (adult) (pediatric): Secondary | ICD-10-CM | POA: Diagnosis not present

## 2013-12-22 DIAGNOSIS — I4891 Unspecified atrial fibrillation: Secondary | ICD-10-CM | POA: Diagnosis not present

## 2013-12-22 DIAGNOSIS — I10 Essential (primary) hypertension: Secondary | ICD-10-CM | POA: Diagnosis not present

## 2013-12-23 DIAGNOSIS — M9901 Segmental and somatic dysfunction of cervical region: Secondary | ICD-10-CM | POA: Diagnosis not present

## 2013-12-23 DIAGNOSIS — R51 Headache: Secondary | ICD-10-CM | POA: Diagnosis not present

## 2013-12-23 DIAGNOSIS — M9903 Segmental and somatic dysfunction of lumbar region: Secondary | ICD-10-CM | POA: Diagnosis not present

## 2013-12-23 DIAGNOSIS — M5010 Cervical disc disorder with radiculopathy, unspecified cervical region: Secondary | ICD-10-CM | POA: Diagnosis not present

## 2013-12-29 ENCOUNTER — Ambulatory Visit: Payer: Managed Care, Other (non HMO) | Admitting: Physical Medicine & Rehabilitation

## 2013-12-31 ENCOUNTER — Ambulatory Visit (INDEPENDENT_AMBULATORY_CARE_PROVIDER_SITE_OTHER): Payer: Medicare Other | Admitting: Internal Medicine

## 2013-12-31 ENCOUNTER — Encounter: Payer: Self-pay | Admitting: Internal Medicine

## 2013-12-31 VITALS — BP 126/70 | HR 83 | Temp 98.2°F | Resp 16 | Ht 67.0 in | Wt 213.0 lb

## 2013-12-31 DIAGNOSIS — E538 Deficiency of other specified B group vitamins: Secondary | ICD-10-CM | POA: Diagnosis not present

## 2013-12-31 DIAGNOSIS — J202 Acute bronchitis due to streptococcus: Secondary | ICD-10-CM | POA: Diagnosis not present

## 2013-12-31 DIAGNOSIS — Z23 Encounter for immunization: Secondary | ICD-10-CM

## 2013-12-31 DIAGNOSIS — I10 Essential (primary) hypertension: Secondary | ICD-10-CM

## 2013-12-31 MED ORDER — CYANOCOBALAMIN 1000 MCG/ML IJ SOLN
1000.0000 ug | Freq: Once | INTRAMUSCULAR | Status: AC
  Administered 2013-12-31: 1000 ug via INTRAMUSCULAR

## 2013-12-31 MED ORDER — HYDROCOD POLST-CPM POLST ER 10-8 MG PO CP12: 1.0000 | ORAL_CAPSULE | Freq: Two times a day (BID) | ORAL | Status: DC | PRN

## 2013-12-31 MED ORDER — AZITHROMYCIN 500 MG PO TABS: 500.0000 mg | ORAL_TABLET | Freq: Every day | ORAL | Status: DC

## 2013-12-31 NOTE — Patient Instructions (Signed)

## 2013-12-31 NOTE — Progress Notes (Signed)
Pre visit review using our clinic review tool, if applicable. No additional management support is needed unless otherwise documented below in the visit note. 

## 2013-12-31 NOTE — Progress Notes (Signed)
Subjective:    Patient ID: Kirsten [name], female    DOB: [birth year], 58 y.o.   MRN: [id number]  Cough This is a new problem. The current episode started in the past 7 days. The problem has been unchanged. The cough is productive of purulent sputum. Associated symptoms include chills, nasal congestion, postnasal drip, rhinorrhea, shortness of breath and wheezing. Pertinent negatives include no chest pain, ear congestion, ear pain, fever, headaches, heartburn, hemoptysis, myalgias, rash, sore throat, sweats or weight loss. She has tried steroid inhaler and a beta-agonist inhaler for the symptoms. The treatment provided mild relief. Her past medical history is significant for asthma, bronchitis and pneumonia. There is no history of bronchiectasis, COPD, emphysema or environmental allergies.      Review of Systems  Constitutional: Positive for chills and fatigue. Negative for fever and weight loss.  HENT: Positive for postnasal drip and rhinorrhea. Negative for ear pain, sinus pressure, sore throat and trouble swallowing.   Eyes: Negative.   Respiratory: Positive for cough, shortness of breath and wheezing. Negative for apnea, hemoptysis, choking, chest tightness and stridor.   Cardiovascular: Negative.  Negative for chest pain, palpitations and leg swelling.  Gastrointestinal: Negative.  Negative for heartburn, nausea, abdominal pain, diarrhea and constipation.  Endocrine: Negative.   Genitourinary: Negative.   Musculoskeletal: Negative.  Negative for myalgias, back pain and arthralgias.  Skin: Negative.  Negative for rash.  Allergic/Immunologic: Negative.  Negative for environmental allergies.  Neurological: Negative.  Negative for headaches.  Hematological: Negative.  Negative for adenopathy. Does not bruise/bleed easily.  Psychiatric/Behavioral: Negative.        Objective:   Physical Exam  Constitutional: She is oriented to person, place, and time. She appears well-developed and  well-nourished.  Non-toxic appearance. She does not have a sickly appearance. She does not appear ill. No distress.  HENT:  Head: Normocephalic and atraumatic.  Mouth/Throat: Oropharynx is clear and moist. No oropharyngeal exudate.  Eyes: Conjunctivae are normal. Right eye exhibits no discharge. Left eye exhibits no discharge. No scleral icterus.  Neck: Normal range of motion. Neck supple. No JVD present. No tracheal deviation present. No thyromegaly present.  Cardiovascular: Normal rate, regular rhythm, normal heart sounds and intact distal pulses.  Exam reveals no gallop and no friction rub.   No murmur heard. Pulmonary/Chest: Effort normal and breath sounds normal. No accessory muscle usage or stridor. No respiratory distress. She has no decreased breath sounds. She has no wheezes. She has no rhonchi. She has no rales. She exhibits no tenderness.  Abdominal: Soft. Bowel sounds are normal. She exhibits no distension and no mass. There is no tenderness. There is no rebound and no guarding.  Musculoskeletal: Normal range of motion. She exhibits no edema or tenderness.  Lymphadenopathy:    She has no cervical adenopathy.  Neurological: She is oriented to person, place, and time.  Skin: Skin is warm and dry. No rash noted. She is not diaphoretic. No erythema. No pallor.  Psychiatric: She has a normal mood and affect. Her behavior is normal. Judgment and thought content normal.  Vitals reviewed.     Lab Results  Component Value Date   WBC 8.2 10/12/2013   HGB 14.9 10/12/2013   HCT 44.2 10/12/2013   PLT 280.0 10/12/2013   GLUCOSE 140* 10/12/2013   CHOL 180 06/02/2013   TRIG 244.0* 06/02/2013   HDL 37.50* 06/02/2013   LDLDIRECT 127.3 10/22/2012   LDLCALC 94 06/02/2013   ALT 25 10/12/2013   AST 24 10/12/2013  NA 140 10/12/2013   K 4.2 10/12/2013   CL 103 10/12/2013   CREATININE 0.6 10/12/2013   BUN 9 10/12/2013   CO2 25 10/12/2013   TSH 2.00 10/12/2013   HGBA1C 6.0 10/12/2013    MICROALBUR 1.0 10/12/2013      Assessment & Plan:

## 2014-01-01 DIAGNOSIS — J202 Acute bronchitis due to streptococcus: Secondary | ICD-10-CM | POA: Insufficient documentation

## 2014-01-01 DIAGNOSIS — E538 Deficiency of other specified B group vitamins: Secondary | ICD-10-CM | POA: Insufficient documentation

## 2014-01-01 NOTE — Assessment & Plan Note (Signed)
Her BP is well controlled 

## 2014-01-01 NOTE — Assessment & Plan Note (Signed)
I will treat the infection with zithromax and will control the cough with tussicaps

## 2014-01-20 ENCOUNTER — Telehealth: Payer: Self-pay | Admitting: Internal Medicine

## 2014-01-20 NOTE — Telephone Encounter (Signed)
Provides DME supplies.  Sent an order on 12/3.  Is requesting order back as soon as possible b/c patient needs supplies.  Will refax order.  Is requesting another physician to sign off on order.

## 2014-01-20 NOTE — Telephone Encounter (Signed)
Received fax place on Dr. Asa Lente counter to review & sign. MD last sign form 02/26/12. MD is out pls advise...Johny Chess

## 2014-01-20 NOTE — Telephone Encounter (Signed)
Faxed DME order back to [facility]...Kirsten [name]

## 2014-01-20 NOTE — Telephone Encounter (Signed)
Signed on behalf of Dr. Ronnald Ramp as requested

## 2014-01-25 DIAGNOSIS — M9901 Segmental and somatic dysfunction of cervical region: Secondary | ICD-10-CM | POA: Diagnosis not present

## 2014-01-25 DIAGNOSIS — M9903 Segmental and somatic dysfunction of lumbar region: Secondary | ICD-10-CM | POA: Diagnosis not present

## 2014-01-25 DIAGNOSIS — M5010 Cervical disc disorder with radiculopathy, unspecified cervical region: Secondary | ICD-10-CM | POA: Diagnosis not present

## 2014-01-25 DIAGNOSIS — R51 Headache: Secondary | ICD-10-CM | POA: Diagnosis not present

## 2014-02-02 DIAGNOSIS — M5010 Cervical disc disorder with radiculopathy, unspecified cervical region: Secondary | ICD-10-CM | POA: Diagnosis not present

## 2014-02-02 DIAGNOSIS — R51 Headache: Secondary | ICD-10-CM | POA: Diagnosis not present

## 2014-02-02 DIAGNOSIS — M9901 Segmental and somatic dysfunction of cervical region: Secondary | ICD-10-CM | POA: Diagnosis not present

## 2014-02-02 DIAGNOSIS — M9903 Segmental and somatic dysfunction of lumbar region: Secondary | ICD-10-CM | POA: Diagnosis not present

## 2014-02-03 ENCOUNTER — Ambulatory Visit: Payer: Medicare Other | Admitting: Internal Medicine

## 2014-02-03 ENCOUNTER — Telehealth: Payer: Self-pay | Admitting: *Deleted

## 2014-02-03 NOTE — Telephone Encounter (Signed)
[facility] - Client TELEPHONE [facility] Patient [name] Gender: Female DOB: June 06, 1955 Age: 58 Y 26 M 3 D Return Phone Number: Address: City/State/Zip: [place] [employer] [facility] - Client Client Site [place] Physician Cathlean Cower Contact Type Call Caller Name [facility] Phone Number n/a Relationship To Patient Self Is this call to report lab results? No Call Type General Information Initial Comment Caller states has appt at 5 pm needs to cancel General Information Type Appointment Nurse Assessment Guidelines Guideline Title Affirmed Question Affirmed Notes Nurse Date/Time (Eastern Time) Disp. Time Eilene Ghazi Time) Disposition Final User 02/03/2014 12:23:58 PM General Information Provided Yes [facility] Instructions Given Call Event Type User Date / Time Description

## 2014-02-09 ENCOUNTER — Other Ambulatory Visit: Payer: Self-pay | Admitting: *Deleted

## 2014-02-09 DIAGNOSIS — F329 Major depressive disorder, single episode, unspecified: Secondary | ICD-10-CM

## 2014-02-09 DIAGNOSIS — F32A Depression, unspecified: Secondary | ICD-10-CM

## 2014-02-09 MED ORDER — PAROXETINE HCL ER 25 MG PO TB24: ORAL_TABLET | ORAL | Status: DC

## 2014-02-26 ENCOUNTER — Other Ambulatory Visit: Payer: Self-pay

## 2014-02-26 DIAGNOSIS — I10 Essential (primary) hypertension: Secondary | ICD-10-CM

## 2014-02-26 MED ORDER — HYDROCHLOROTHIAZIDE 25 MG PO TABS: 25.0000 mg | ORAL_TABLET | Freq: Every day | ORAL | Status: DC

## 2014-03-10 LAB — HM DIABETES EYE EXAM

## 2014-03-23 ENCOUNTER — Ambulatory Visit: Payer: Self-pay | Admitting: Obstetrics and Gynecology

## 2014-04-08 ENCOUNTER — Other Ambulatory Visit: Payer: Self-pay | Admitting: Internal Medicine

## 2014-04-08 ENCOUNTER — Encounter: Payer: Self-pay | Admitting: Internal Medicine

## 2014-04-08 DIAGNOSIS — I480 Paroxysmal atrial fibrillation: Secondary | ICD-10-CM | POA: Insufficient documentation

## 2014-04-27 ENCOUNTER — Other Ambulatory Visit: Payer: Self-pay

## 2014-04-27 DIAGNOSIS — I10 Essential (primary) hypertension: Secondary | ICD-10-CM

## 2014-04-27 MED ORDER — METFORMIN HCL 1000 MG PO TABS: 1000.0000 mg | ORAL_TABLET | Freq: Two times a day (BID) | ORAL | Status: DC

## 2014-06-01 ENCOUNTER — Telehealth: Payer: Self-pay

## 2014-06-01 NOTE — Telephone Encounter (Signed)
She is coming in tomorrow

## 2014-06-01 NOTE — Telephone Encounter (Signed)
rx request for alprazolam and zolpidem.   LOV: 12/2013

## 2014-06-02 ENCOUNTER — Encounter: Payer: Self-pay | Admitting: Internal Medicine

## 2014-06-02 ENCOUNTER — Ambulatory Visit (INDEPENDENT_AMBULATORY_CARE_PROVIDER_SITE_OTHER): Payer: PPO | Admitting: Internal Medicine

## 2014-06-02 ENCOUNTER — Other Ambulatory Visit (INDEPENDENT_AMBULATORY_CARE_PROVIDER_SITE_OTHER): Payer: PPO

## 2014-06-02 VITALS — BP 124/84 | HR 77 | Temp 98.0°F | Resp 16 | Ht 67.0 in | Wt 220.0 lb

## 2014-06-02 DIAGNOSIS — E785 Hyperlipidemia, unspecified: Secondary | ICD-10-CM | POA: Diagnosis not present

## 2014-06-02 DIAGNOSIS — F411 Generalized anxiety disorder: Secondary | ICD-10-CM

## 2014-06-02 DIAGNOSIS — I10 Essential (primary) hypertension: Secondary | ICD-10-CM | POA: Diagnosis not present

## 2014-06-02 DIAGNOSIS — E538 Deficiency of other specified B group vitamins: Secondary | ICD-10-CM | POA: Diagnosis not present

## 2014-06-02 DIAGNOSIS — E118 Type 2 diabetes mellitus with unspecified complications: Secondary | ICD-10-CM

## 2014-06-02 DIAGNOSIS — F409 Phobic anxiety disorder, unspecified: Secondary | ICD-10-CM

## 2014-06-02 DIAGNOSIS — E890 Postprocedural hypothyroidism: Secondary | ICD-10-CM | POA: Diagnosis not present

## 2014-06-02 DIAGNOSIS — F5105 Insomnia due to other mental disorder: Secondary | ICD-10-CM

## 2014-06-02 LAB — HEMOGLOBIN A1C: HEMOGLOBIN A1C: 6.8 % — AB (ref 4.6–6.5)

## 2014-06-02 LAB — CBC WITH DIFFERENTIAL/PLATELET
BASOS ABS: 0.1 10*3/uL (ref 0.0–0.1)
Basophils Relative: 0.7 % (ref 0.0–3.0)
Eosinophils Absolute: 0.2 10*3/uL (ref 0.0–0.7)
Eosinophils Relative: 1.9 % (ref 0.0–5.0)
HCT: 43.1 % (ref 36.0–46.0)
HEMOGLOBIN: 14.7 g/dL (ref 12.0–15.0)
LYMPHS ABS: 3.6 10*3/uL (ref 0.7–4.0)
Lymphocytes Relative: 41.9 % (ref 12.0–46.0)
MCHC: 34.1 g/dL (ref 30.0–36.0)
MCV: 87.3 fl (ref 78.0–100.0)
MONOS PCT: 5.9 % (ref 3.0–12.0)
Monocytes Absolute: 0.5 10*3/uL (ref 0.1–1.0)
NEUTROS ABS: 4.2 10*3/uL (ref 1.4–7.7)
Neutrophils Relative %: 49.6 % (ref 43.0–77.0)
PLATELETS: 289 10*3/uL (ref 150.0–400.0)
RBC: 4.94 Mil/uL (ref 3.87–5.11)
RDW: 14.3 % (ref 11.5–15.5)
WBC: 8.5 10*3/uL (ref 4.0–10.5)

## 2014-06-02 LAB — LIPID PANEL
CHOL/HDL RATIO: 6
CHOLESTEROL: 211 mg/dL — AB (ref 0–200)
HDL: 38.3 mg/dL — ABNORMAL LOW (ref >39.00)
NonHDL: 172.7
Triglycerides: 352 mg/dL — ABNORMAL HIGH (ref 0.0–149.0)
VLDL: 70.4 mg/dL — ABNORMAL HIGH (ref 0.0–40.0)

## 2014-06-02 LAB — COMPREHENSIVE METABOLIC PANEL
ALT: 24 U/L (ref 0–35)
AST: 19 U/L (ref 0–37)
Albumin: 4.4 g/dL (ref 3.5–5.2)
Alkaline Phosphatase: 66 U/L (ref 39–117)
BUN: 15 mg/dL (ref 6–23)
CALCIUM: 10.2 mg/dL (ref 8.4–10.5)
CHLORIDE: 100 meq/L (ref 96–112)
CO2: 28 meq/L (ref 19–32)
Creatinine, Ser: 0.82 mg/dL (ref 0.40–1.20)
GFR: 76.42 mL/min (ref >60.00)
Glucose, Bld: 152 mg/dL — ABNORMAL HIGH (ref 70–99)
Potassium: 3.8 mEq/L (ref 3.5–5.1)
Sodium: 138 mEq/L (ref 135–145)
Total Bilirubin: 0.5 mg/dL (ref 0.2–1.2)
Total Protein: 7.1 g/dL (ref 6.0–8.3)

## 2014-06-02 LAB — LDL CHOLESTEROL, DIRECT: LDL DIRECT: 121 mg/dL

## 2014-06-02 LAB — TSH: TSH: 3.22 u[IU]/mL (ref 0.35–4.50)

## 2014-06-02 MED ORDER — ZOLPIDEM TARTRATE 10 MG PO TABS: ORAL_TABLET | ORAL | Status: DC

## 2014-06-02 MED ORDER — EZETIMIBE 10 MG PO TABS: 10.0000 mg | ORAL_TABLET | Freq: Every day | ORAL | Status: DC

## 2014-06-02 MED ORDER — CYANOCOBALAMIN 1000 MCG/ML IJ SOLN
1000.0000 ug | Freq: Once | INTRAMUSCULAR | Status: AC
  Administered 2014-06-02: 1000 ug via INTRAMUSCULAR

## 2014-06-02 MED ORDER — ALPRAZOLAM 0.25 MG PO TABS: ORAL_TABLET | ORAL | Status: DC

## 2014-06-02 NOTE — Assessment & Plan Note (Signed)
Her TSh is in the normal range Will cont the current synthroid dose

## 2014-06-02 NOTE — Patient Instructions (Signed)

## 2014-06-02 NOTE — Assessment & Plan Note (Signed)
She has not achieved her LDL goal Will ask her to add zetia to the statin

## 2014-06-02 NOTE — Assessment & Plan Note (Signed)
Her blood sugars are well controlled Cont the current regimen

## 2014-06-02 NOTE — Assessment & Plan Note (Signed)
Her BP is well controlled Lytes and renal function are stable 

## 2014-06-02 NOTE — Progress Notes (Signed)
Pre visit review using our clinic review tool, if applicable. No additional management support is needed unless otherwise documented below in the visit note. 

## 2014-06-02 NOTE — Progress Notes (Signed)
   Subjective:    Patient ID: Kirsten [name], female    DOB: [birth year], 59 y.o.   MRN: [id number]  Thyroid Problem Presents for follow-up visit. Symptoms include anxiety. Patient reports no cold intolerance, constipation, depressed mood, diaphoresis, diarrhea, dry skin, fatigue, hair loss, heat intolerance, hoarse voice, leg swelling, nail problem, palpitations, tremors, visual change, weight gain or weight loss. The symptoms have been stable. Past treatments include levothyroxine. The treatment provided significant relief.      Review of Systems  Constitutional: Negative.  Negative for fever, chills, weight loss, weight gain, diaphoresis, appetite change and fatigue.  HENT: Negative.  Negative for hoarse voice.   Eyes: Negative.   Respiratory: Negative.   Cardiovascular: Negative.  Negative for chest pain, palpitations and leg swelling.  Gastrointestinal: Negative.  Negative for nausea, vomiting, abdominal pain, diarrhea and constipation.  Endocrine: Negative.  Negative for cold intolerance, heat intolerance, polydipsia, polyphagia and polyuria.  Genitourinary: Negative.   Musculoskeletal: Negative.  Negative for myalgias, back pain, joint swelling and arthralgias.  Skin: Negative.   Allergic/Immunologic: Negative.   Neurological: Negative.  Negative for dizziness, tremors, syncope and light-headedness.  Hematological: Negative.  Negative for adenopathy. Does not bruise/bleed easily.  Psychiatric/Behavioral: Positive for sleep disturbance. Negative for suicidal ideas, behavioral problems, self-injury, dysphoric mood and decreased concentration. The patient is nervous/anxious.        Objective:   Physical Exam  Constitutional: She is oriented to person, place, and time. She appears well-developed and well-nourished. No distress.  HENT:  Head: Normocephalic and atraumatic.  Mouth/Throat: Oropharynx is clear and moist. No oropharyngeal exudate.  Eyes: Conjunctivae are normal. Right  eye exhibits no discharge. Left eye exhibits no discharge. No scleral icterus.  Neck: Normal range of motion. Neck supple. No JVD present. No tracheal deviation present. No thyromegaly present.  Cardiovascular: Normal rate, regular rhythm, normal heart sounds and intact distal pulses.  Exam reveals no gallop and no friction rub.   No murmur heard. Pulmonary/Chest: Effort normal and breath sounds normal. No stridor. No respiratory distress. She has no wheezes. She has no rales. She exhibits no tenderness.  Abdominal: Soft. Bowel sounds are normal. She exhibits no distension and no mass. There is no tenderness. There is no rebound and no guarding.  Musculoskeletal: Normal range of motion. She exhibits no edema or tenderness.  Lymphadenopathy:    She has no cervical adenopathy.  Neurological: She is oriented to person, place, and time.  Skin: Skin is warm and dry. No rash noted. She is not diaphoretic. No erythema. No pallor.  Psychiatric: She has a normal mood and affect. Her behavior is normal. Judgment and thought content normal.  Vitals reviewed.         Assessment & Plan:

## 2014-06-04 NOTE — Op Note (Signed)
PATIENT NAME:  Kirsten [name], Kirsten [name] MR#:  [id number] DATE OF BIRTH:  1955/09/15  DATE OF PROCEDURE:  03/19/2012  PREOPERATIVE DIAGNOSIS: Left breast mass, left lower inner quadrant  POSTOPERATIVE DIAGNOSIS: Same.  PROCEDURE PERFORMED: Left breast mass excision.   SURGEON: Sherri Rad, M.D.   ASSISTANT: None.   ANESTHESIA: General with local.   DESCRIPTION OF PROCEDURE: With the patient in the supine position, The left breast and chest was sterilely prepped and draped with ChloraPrep solution. Timeout was observed. Incision was fashioned directly over the mass. Subcutaneous tissues were divided with scalpel and electrocautery. 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 The location of the mass was in the left lower inner quadrant. With the mass enucleated and hemostasis obtained with point cautery, the wound was irrigated and aspirated dry. A total of 30 mL of 0.25% plain Marcaine was infiltrated in the wound for postoperative anesthesia and the deep layer of the wound was obliterated with 3-0 Vicryls, 4-0 Monocryl subcuticular applied to the skin edges followed by half-inch Steri-Strips, Telfa and Tegaderm. The patient was then subsequently extubated and taken to the recovery room in stable and satisfactory condition by anesthesia services. ____________________________ Jeannette How Marina Gravel, MD mab:sb D: 03/19/2012 08:24:54 ET T: 03/19/2012 08:35:05 ET JOB#: [id number]  cc: Elta Guadeloupe A. Marina Gravel, MD, <Dictator> Dr. Enzo Bi.  [facility]. Jarrah Seher A Avrom Robarts MD ELECTRONICALLY SIGNED 03/31/2012 20:32

## 2014-06-05 NOTE — Consult Note (Signed)
PATIENT NAME:  Kirsten [name], Kirsten [name] MR#:  [id number] DATE OF BIRTH:  1955/11/15  DATE OF CONSULTATION:  11/26/2013  REFERRING PHYSICIAN:   CONSULTING PHYSICIAN:  Dionisio David, MD  INDICATION FOR CONSULTATION: Atrial fibrillation with chest pain and palpitations.   HISTORY OF PRESENT ILLNESS: This is a 59 year old white female with a history of hypertension, hyperlipidemia, and diabetes mellitus type 2 who presented to the Emergency Room with chest pain, palpitations, and shortness of breath for a few hours. She was at [facility] and all of a sudden she felt like she was going to pass out. She was dizzy, diaphoretic. She had a heart rate of 250 with wide complex tachycardia initially and was given adenosine without any conversion to sinus rhythm. Then she was given amiodarone 150 mg bolus followed by amiodarone drip for 2 hours. Two hours later it seems that she changed from 250 beats per minute heart rate to 195. The follow-up EKG revealed atrial fibrillation with rapid ventricular response rate. I was asked to evaluate the patient for possible cardioversion. The patient was still having some palpitations, shortness of breath and diaphoresis even though she was not hypotensive. Blood pressure was being maintained at 150/90. She was very anxious and thus 6 mg of total Versed and 0.25 fentanyl was given and the patient was converted with DC cardioversion biphasic 100 joules. The patient tolerated the procedure very well. Sedation was given by the ER physician. Right now she is back in sinus rhythm with sinus tachycardia at 123 beats per minute with nonspecific ST-T changes.   PAST MEDICAL HISTORY: Hypertension, diabetes and hyperlipidemia.   SOCIAL HISTORY: No history of EtOH abuse. No smoking.   FAMILY HISTORY: Father has atrial fibrillation. He lives in [place].   PHYSICAL EXAMINATION: GENERAL: She is alert and oriented, a bit anxious.  VITAL SIGNS: Blood pressure 150/70, respirations 25, pulse  right now is 123, sinus tachycardia on the monitor. No fever, she is afebrile.  NECK: No JVD.  LUNGS: Clear.  HEART: Regular rate and rhythm. Tachycardic. Normal S1, S2. No audible murmur.  ABDOMEN: Soft, nontender. Positive bowel sounds.  EXTREMITIES: No pedal edema.  NEUROLOGIC: The patient appears to be intact after cardioversion.  DIAGNOSTIC DATA: EKG shows sinus rhythm with sinus tachycardia, 123 beats per minute, nonspecific ST-T changes with occasional atrial premature contraction.   Magnesium 1.6. BUN 11, creatinine 0.81, glucose 220. Troponin initial one was normal at 0.03. TSH 3.07. Her CBC shows white count of 8.8, hemoglobin 15.7, hematocrit 48, and platelet count 319,000.  ASSESSMENT AND PLAN: The patient had atrial fibrillation with rapid ventricular response rate, as high is 250, normalized later on with amiodarone to 190, right now is in sinus tachycardia. Continue IV amiodarone, Lovenox dosage was given. The patient's initial troponin is negative, feeling very well after the cardioversion. Advised continuation of amiodarone and anticoagulation and get an echocardiogram.   Thank you very much for the referral.   ____________________________ Dionisio David, MD sak:sb D: 11/26/2013 14:27:06 ET T: 11/26/2013 14:50:21 ET JOB#: [id number]  cc: Dionisio David, MD, <Dictator> Dionisio David MD ELECTRONICALLY SIGNED 12/04/2013 12:50

## 2014-06-05 NOTE — H&P (Signed)
PATIENT NAME:  Kirsten [name], Kirsten [name] MR#:  [id number] DATE OF BIRTH:  December 10, 1955  DATE OF ADMISSION:  11/26/2013  ADMITTING PHYSICIAN:  Dr. Gladstone Lighter.    PRIMARY CARE PHYSICIAN: Dr. [name] from [facility] in [place].    CHIEF COMPLAINT: Palpitations.   HISTORY OF PRESENT ILLNESS: [name] is a very pleasant 59 year old Caucasian female with past medical history significant for obstructive sleep apnea on CPAP, hypertension, non-insulin-dependent diabetes mellitus. Presented to the hospital secondary to sudden onset of chest heaviness with palpitations while she was shopping at [employer] this morning. The patient states that she was fine last night, she was fine when she woke up this morning, she was shopping and all of a sudden she felt this pain in her neck between her shoulder blades and her heart started beating heavily that she could not even take a step, she fell so EMS was called. Her heart rate was in the 270s and was brought over to the ER. Initially the rhythm was difficult to interpret because of such fast heart rate, they gave adenosine, amiodarone pushes, her heart rate improved only to 180s and the patient was very symptomatic. It seemed like atrial fibrillation at that point and cardiology was consulted stat and she got cardioverted.  Currently she is in sinus tachycardia, heart rate of 102 at this time on an amiodarone drip. She is being admitted to CCU for the same and also her blood pressure dropped after sedation and her systolic is in the 50P at this time. Her palpitations and other symptoms have improved at this time.   PAST MEDICAL HISTORY:  1. Hypertension.  2. Hypothyroidism.  3. Pulmonary fibrosis.  4. Obstructive sleep apnea on CPAP.  5. Non-insulin-dependent diabetes mellitus.   PAST SURGICAL HISTORY:  1.  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    2.  C-sections.  3.  Cholecystectomy.  4.  Tubal ligation.     ALLERGIES TO MEDICATIONS: GEMIFLOXACIN, PENICILLIN, AND ALSO SULFA DRUGS.   CURRENT HOME MEDICATIONS:  1. Synthroid 75 mcg p.o. daily.  2. Hydrochlorothiazide 25 mg p.o. daily.  3. Metformin 1000 mg p.o. b.i.d.  4. Paxil 10 mg p.o. daily.  5. Multivitamin 1 tablet p.o. daily.  6. Ambien 10 mg p.o. at bedtime as needed.   SOCIAL HISTORY: Lives at home with her husband, not working at this time. No smoking or alcohol use.   FAMILY HISTORY: Dad with atrial fibrillation. Mom passed away with non-Hodgkin's lymphoma.  Son diagnosed with type 1 diabetes mellitus.   REVIEW OF SYSTEMS:  CONSTITUTIONAL: No fever, fatigue, or weakness.  EYES: No blurred vision, double vision, inflammation or glaucoma. Uses reading glasses.  EARS, NOSE, AND THROAT: No tinnitus, ear pain, hearing loss, epistaxis, or discharge.  RESPIRATORY: No cough, wheeze, hemoptysis, or COPD.   CARDIOVASCULAR: Positive for chest pain and arrhythmia, palpitations, near syncope. No orthopnea, edema.  GASTROINTESTINAL: No nausea, vomiting, diarrhea, abdominal pain, hematemesis, or melena.  GENITOURINARY: No dysuria, hematochezia, renal calculus, frequency, incontinence.  ENDOCRINE: No polyuria, nocturia, thyroid problems, heat or cold intolerance.  HEMATOLOGY: No anemia, easy bruising or bleeding.  SKIN: No acne, rash, or lesions.  MUSCULOSKELETAL: No neck, back, or shoulder pain, arthritis, or gout.  NEUROLOGIC: No numbness, weakness, CVA, TIA, or  seizures.  PSYCHOLOGICAL: No anxiety, insomnia, or depression.   PHYSICAL EXAMINATION:  VITAL SIGNS: Temperature 98.9 degrees Fahrenheit, pulse 260, blood pressure 131/68, respirations 22, pulse oximetry 98% on 2 liters oxygen.  GENERAL: Heavily built, well-nourished female lying in bed, not in any acute distress.  HEENT: Normocephalic, atraumatic. Pupils equal, round, reacting to light. Anicteric sclerae. Extraocular movements intact. Oropharynx is clear without erythema, mass, or  exudates.  NECK: Supple. No thyromegaly, JVD, or carotid bruits. No lymphadenopathy.  LUNGS: Moving air bilaterally. No wheeze or crackles. No use of accessory muscles for breathing.  CARDIOVASCULAR: S1, S2, regular rate and rhythm. No murmurs, rubs, or gallops.  ABDOMEN: Soft, nontender, nondistended. No hepatosplenomegaly. Normal bowel sounds.  EXTREMITIES: No pedal edema. No clubbing or cyanosis. 2 + dorsalis pedis pulses palpable bilaterally.  SKIN: No acne, rash, or lesions.  The right arm however where the blood pressure cuff is maintained is slightly erythematous beyond the cuff distal to that and small petechiae noted on the hand as well which are blanching and easily disappearing.   LYMPHATICS: No cervical lymphadenopathy.  NEUROLOGIC: Cranial nerves intact. No focal motor or sensory deficits.  PSYCHOLOGICAL: The patient is awake, alert, oriented x 3.   LABORATORY DATA:  1.  WBC 8.8, hemoglobin 15.7, hematocrit 48.0, platelet count 319,000.   2.  Sodium 138, potassium 3.6, chloride 101, bicarbonate 24, BUN 11, creatinine 0.81, glucose 220, calcium of 9.2. INR is 1.0. Magnesium 1.6. TSH 3.07. Troponin is negative.  3.  Chest x-ray showing stable cardiomegaly, mild pulmonary vascular congestion, no overt CHF noted.   ASSESSMENT AND PLAN:  A 59 year old female with hypertension, diabetes, hypothyroidism, admitted for new onset atrial fibrillation, heart rate 270 when she arrived.    1.  Tachycardia, likely paroxysmal atrial fibrillation, new onset, initially not controlled with IV amiodarone or adenosine, very symptomatic, so far cardioverted in the Emergency Room. We will monitor in the ICU stepdown. The patient's blood pressure is slightly on the lower side. Continue amiodarone drip. Appreciate cardiology consult. Check echocardiogram. Beta blocker if blood pressure can tolerate. The patient received Lovenox here in the ER but we will start Eliquis as per cardiology recommendations.  2.   Hypertension with low blood pressure. Hold off on her HCTZ. Metoprolol if she can tolerate for now.  3.  Non-insulin-dependent diabetes mellitus on metformin. Sliding scale insulin. Check HbA1c.  4.  Hypothyroidism.  Continue on her Synthroid.   5.  Deep vein thrombosis prophylaxis.   CODE STATUS: Full code.   TIME SPENT ON ADMISSION: 50 minutes.    ____________________________ Gladstone Lighter, MD rk:bu D: 11/26/2013 15:09:28 ET T: 11/26/2013 15:28:04 ET JOB#: [id number]  cc: Gladstone Lighter, MD, <Dictator> Dionisio David, MD Dr. [name] from [facility] in [place] SIGNED 11/26/2013 17:47

## 2014-06-05 NOTE — Discharge Summary (Signed)
PATIENT NAME:  Kirsten [name], Kirsten [name] MR#:  [id number] DATE OF BIRTH:  05-Feb-1956  DATE OF ADMISSION:  11/26/2013 DATE OF DISCHARGE:  11/27/2013  DISCHARGE DIAGNOSES: 1.  New atrial fibrillation with rapid ventricular response.  2.  Hypertension.  3.  Diabetes mellitus type 2.  4.  Hypothyroidism.  5.  Pulmonary fibrosis.  6.  Obstructive sleep apnea on CPAP.   CONSULTATIONS: Angelica Ran M.D., cardiology.   PROCEDURES: 1.  Cardioversion performed October 15; performed due to atrial fibrillation with rapid ventricular rate. Dr. Humphrey Rolls performed a successful electric cardioversion to normal sinus rhythm with no complications.  2.  Chest x-ray performed 11/26/2013 shows stable cardiomegaly with mild pulmonary vascular congestion. No CHF.   HISTORY OF PRESENT ILLNESS: This very pleasant 59 year old Caucasian female with past medical history of obstructive sleep apnea on CPAP, hypertension, diabetes mellitus not requiring insulin, presents to the hospital secondary to sudden onset chest heaviness with palpitations while she was shopping at [facility]. She states that she had been in her normal state of health up until this episode. During the episode she felt weak and could not stand up or walk so EMS was called. Her heart rate was in the 270's when EMS arrived. She received adenosine, amiodarone and heart rate improved to 180s. At this point, heart seemed to be in atrial fibrillation. Cardiology was consulted and she was immediately cardioverted. By the time of being seen by the hospitalist service she had a heart rate of 102 and was on an amiodarone drip. At the time of hospitalist examination she was in sinus tachycardia with heart rate of 102 on an amiodarone drip. She was admitted to the CCU.   HOSPITAL COURSE:  1.  Atrial fibrillation with rapid ventricular response:  As mentioned above the patient was cardioverted upon presentation by Dr. Humphrey Rolls. She was started on an amiodarone drip and then  transitioned to amiodarone at 400 mg p.o. daily. She was started on Eliquis 5 mg b.i.d.  On the morning prior to admission, she was asymptomatic and doing very well. She was discharged that afternoon after replacement of potassium and she will follow up with Dr. Humphrey Rolls in his office early next week.  2.  Hypertension: She will continue on metoprolol. We will hold hydrochlorothiazide at the time of discharge. This can be reviewed with her primary care physician or cardiologist as her blood pressure is checked next week.  3.  Diabetes mellitus: Continue metformin.  4.  Hypothyroidism: Continue Synthroid. TSH checked on admission is 3.7, normal.   DISCHARGE PHYSICAL EXAMINATION: VITAL SIGNS: Temperature 98.1, heart rate 66, respirations 15, blood pressure 135/73. Oxygenation 93% on room air.  GENERAL: No acute distress.  CARDIOVASCULAR: Regular rate and rhythm. No murmurs, rubs or gallops.  EXTREMITIES:  No peripheral edema. Peripheral pulses are 2+.  PULMONARY: Lungs are clear to auscultation bilaterally with good air movement.  ABDOMEN: Soft, nontender, nondistended. Bowel sounds are normal.   LABORATORY DATA: Sodium 142, potassium 4.5, chloride 107, bicarbonate 28. BUN 7, creatinine 0.60. Glucose 144. Total cholesterol 190, triglycerides 430 - not fasting. Hemoglobin A1c is 7.2. Troponin is negative at 0.03. TSH 3.07. White blood cells 8, hemoglobin 12.8, platelets 240,000, MCV 88.,   DISCHARGE MEDICATIONS: 1.  Ambien 10 mg 1 tablet once a day at bedtime.  2.  Multivitamin 1 tablet once a day.  4.  Metformin 1000 mg 1 tablet twice a day.  5.  Paxil extended release 25 mg oral tablet 1 tablet once a  day.  6.  Xanax 0.25 mg 1 tablet once a day.  7.  Synthroid 75 mcg oral tablet 1 tablet once a day in the morning.  8.  Apixaban 5 mg oral tablet 1 tablet twice a day.  9.  Amiodarone 400 mg oral tablet 1 tablet once a day.   CONDITION ON DISCHARGE: Stable.   DISPOSITION: The patient is discharged  to home with followup early next week with cardiology.   DISCHARGE INSTRUCTIONS: DIET: ADA carbohydrate modified heart healthy diet.   ACTIVITY: No restrictions.   FOLLOWUP:  Follow up early next week with cardiology.   Time spent on discharge: 35 minutes.   ____________________________ Earleen Newport. Volanda Napoleon, MD cpw:jw D: 11/29/2013 20:50:51 ET T: 11/29/2013 22:02:41 ET JOB#: [id number]  cc: Earleen Newport. Volanda Napoleon, MD, <Dictator> Aldean Jewett MD ELECTRONICALLY SIGNED 12/07/2013 15:41

## 2014-07-14 LAB — HM MAMMOGRAPHY: HM Mammogram: NORMAL

## 2014-07-30 ENCOUNTER — Other Ambulatory Visit: Payer: Self-pay

## 2014-07-30 MED ORDER — LEVOTHYROXINE SODIUM 75 MCG PO TABS: 75.0000 ug | ORAL_TABLET | Freq: Every day | ORAL | Status: DC

## 2014-07-30 NOTE — Telephone Encounter (Signed)
Received refill request from [facility]   request refills for Synthroid 75 mg tablet . Rx last written 10/20/2013 and pt last seen 06/02/2014   .

## 2014-08-23 ENCOUNTER — Telehealth: Payer: Self-pay

## 2014-08-23 NOTE — Telephone Encounter (Signed)
Patient called to educate on Medicare Wellness apt. LVM for the patient to call back to educate and schedule for wellness visit.

## 2014-08-30 ENCOUNTER — Telehealth: Payer: Self-pay | Admitting: Internal Medicine

## 2014-08-30 DIAGNOSIS — F409 Phobic anxiety disorder, unspecified: Secondary | ICD-10-CM

## 2014-08-30 DIAGNOSIS — F5105 Insomnia due to other mental disorder: Principal | ICD-10-CM

## 2014-08-30 MED ORDER — ZOLPIDEM TARTRATE 10 MG PO TABS: ORAL_TABLET | ORAL | Status: DC

## 2014-08-30 NOTE — Telephone Encounter (Signed)
Patient states the pharmacy told her she has no more refills for her Lorrin Mais.  [facility] on [address].

## 2014-09-03 NOTE — Telephone Encounter (Signed)
Contact made with the patient to introduce AWV. STated she would like to think about it and has number for call back if interested.

## 2014-09-21 ENCOUNTER — Telehealth: Payer: Self-pay

## 2014-09-21 NOTE — Telephone Encounter (Signed)
Left message advising patient that mammogram is due, she can call back to schedule appt or if mammogram has been completed recently, we can update her medical record

## 2014-10-04 ENCOUNTER — Encounter: Payer: Self-pay | Admitting: Internal Medicine

## 2014-10-04 ENCOUNTER — Ambulatory Visit (INDEPENDENT_AMBULATORY_CARE_PROVIDER_SITE_OTHER): Payer: PPO | Admitting: Internal Medicine

## 2014-10-04 ENCOUNTER — Other Ambulatory Visit (INDEPENDENT_AMBULATORY_CARE_PROVIDER_SITE_OTHER): Payer: PPO

## 2014-10-04 VITALS — BP 130/82 | HR 71 | Temp 98.1°F | Resp 16 | Ht 67.0 in | Wt 219.0 lb

## 2014-10-04 DIAGNOSIS — J4531 Mild persistent asthma with (acute) exacerbation: Secondary | ICD-10-CM | POA: Diagnosis not present

## 2014-10-04 DIAGNOSIS — E785 Hyperlipidemia, unspecified: Secondary | ICD-10-CM

## 2014-10-04 DIAGNOSIS — E038 Other specified hypothyroidism: Secondary | ICD-10-CM

## 2014-10-04 DIAGNOSIS — G4733 Obstructive sleep apnea (adult) (pediatric): Secondary | ICD-10-CM

## 2014-10-04 DIAGNOSIS — I4891 Unspecified atrial fibrillation: Secondary | ICD-10-CM

## 2014-10-04 DIAGNOSIS — I10 Essential (primary) hypertension: Secondary | ICD-10-CM | POA: Diagnosis not present

## 2014-10-04 DIAGNOSIS — E782 Mixed hyperlipidemia: Secondary | ICD-10-CM

## 2014-10-04 DIAGNOSIS — I480 Paroxysmal atrial fibrillation: Secondary | ICD-10-CM | POA: Insufficient documentation

## 2014-10-04 DIAGNOSIS — E118 Type 2 diabetes mellitus with unspecified complications: Secondary | ICD-10-CM

## 2014-10-04 DIAGNOSIS — J45901 Unspecified asthma with (acute) exacerbation: Secondary | ICD-10-CM | POA: Insufficient documentation

## 2014-10-04 DIAGNOSIS — N390 Urinary tract infection, site not specified: Secondary | ICD-10-CM

## 2014-10-04 LAB — TSH: TSH: 2.26 u[IU]/mL (ref 0.35–4.50)

## 2014-10-04 LAB — LIPID PANEL
Cholesterol: 213 mg/dL — ABNORMAL HIGH (ref 0–200)
HDL: 45.2 mg/dL (ref >39.00)
NONHDL: 167.91
Total CHOL/HDL Ratio: 5
Triglycerides: 260 mg/dL — ABNORMAL HIGH (ref 0.0–149.0)
VLDL: 52 mg/dL — ABNORMAL HIGH (ref 0.0–40.0)

## 2014-10-04 LAB — URINALYSIS, ROUTINE W REFLEX MICROSCOPIC
Bilirubin Urine: NEGATIVE
KETONES UR: NEGATIVE
Nitrite: NEGATIVE
PH: 6 (ref 5.0–8.0)
SPECIFIC GRAVITY, URINE: 1.015 (ref 1.000–1.030)
Total Protein, Urine: NEGATIVE
Urine Glucose: NEGATIVE
Urobilinogen, UA: 0.2 (ref 0.0–1.0)

## 2014-10-04 LAB — CBC WITH DIFFERENTIAL/PLATELET
BASOS ABS: 0.1 10*3/uL (ref 0.0–0.1)
BASOS PCT: 0.5 % (ref 0.0–3.0)
EOS ABS: 0.1 10*3/uL (ref 0.0–0.7)
Eosinophils Relative: 0.6 % (ref 0.0–5.0)
HEMATOCRIT: 45.1 % (ref 36.0–46.0)
Hemoglobin: 15.2 g/dL — ABNORMAL HIGH (ref 12.0–15.0)
LYMPHS ABS: 2.9 10*3/uL (ref 0.7–4.0)
LYMPHS PCT: 23.4 % (ref 12.0–46.0)
MCHC: 33.7 g/dL (ref 30.0–36.0)
MCV: 90.8 fl (ref 78.0–100.0)
MONO ABS: 0.5 10*3/uL (ref 0.1–1.0)
Monocytes Relative: 3.7 % (ref 3.0–12.0)
NEUTROS ABS: 8.8 10*3/uL — AB (ref 1.4–7.7)
NEUTROS PCT: 71.8 % (ref 43.0–77.0)
PLATELETS: 341 10*3/uL (ref 150.0–400.0)
RBC: 4.97 Mil/uL (ref 3.87–5.11)
RDW: 14.7 % (ref 11.5–15.5)
WBC: 12.3 10*3/uL — ABNORMAL HIGH (ref 4.0–10.5)

## 2014-10-04 LAB — COMPREHENSIVE METABOLIC PANEL
ALK PHOS: 65 U/L (ref 39–117)
ALT: 29 U/L (ref 0–35)
AST: 20 U/L (ref 0–37)
Albumin: 4.6 g/dL (ref 3.5–5.2)
BILIRUBIN TOTAL: 0.5 mg/dL (ref 0.2–1.2)
BUN: 17 mg/dL (ref 6–23)
CO2: 31 mEq/L (ref 19–32)
Calcium: 10.4 mg/dL (ref 8.4–10.5)
Chloride: 99 mEq/L (ref 96–112)
Creatinine, Ser: 0.78 mg/dL (ref 0.40–1.20)
GFR: 80.86 mL/min (ref >60.00)
GLUCOSE: 181 mg/dL — AB (ref 70–99)
Potassium: 4.5 mEq/L (ref 3.5–5.1)
SODIUM: 139 meq/L (ref 135–145)
TOTAL PROTEIN: 7.4 g/dL (ref 6.0–8.3)

## 2014-10-04 LAB — LDL CHOLESTEROL, DIRECT: LDL DIRECT: 138 mg/dL

## 2014-10-04 LAB — HEMOGLOBIN A1C: HEMOGLOBIN A1C: 6.8 % — AB (ref 4.6–6.5)

## 2014-10-04 MED ORDER — NITROFURANTOIN MONOHYD MACRO 100 MG PO CAPS: 100.0000 mg | ORAL_CAPSULE | Freq: Two times a day (BID) | ORAL | Status: AC

## 2014-10-04 MED ORDER — MOMETASONE FURO-FORMOTEROL FUM 200-5 MCG/ACT IN AERO: 2.0000 | INHALATION_SPRAY | Freq: Two times a day (BID) | RESPIRATORY_TRACT | Status: DC

## 2014-10-04 NOTE — Progress Notes (Signed)
Pre visit review using our clinic review tool, if applicable. No additional management support is needed unless otherwise documented below in the visit note. 

## 2014-10-04 NOTE — Progress Notes (Signed)
Subjective:  Patient ID: Kirsten [name], female    DOB: 1956-01-07  Age: 59 y.o. MRN: [id number]  CC: Hypertension; Hypothyroidism; Diabetes; and Asthma   HPI Kirsten [name] presents for follow-up on medical problems but her main concern today is for the last month she has noticed a nonproductive cough, wheezing, shortness of breath, and dyspnea on exertion. She tells me that she was seen at an urgent care center about a week ago and was prescribed an oral course of prednisone. She states that helped improve her symptoms. She does not report any chest pain, palpitations, or edema.  Outpatient Prescriptions Prior to Visit  Medication Sig Dispense Refill  . ALPRAZolam (XANAX) 0.25 MG tablet take 1 tablet by mouth once daily if needed 90 tablet 1  . amiodarone (PACERONE) 200 MG tablet Take 200 mg by mouth daily.    Marland Kitchen apixaban (ELIQUIS) 5 MG TABS tablet Take 5 mg by mouth 2 (two) times daily.    Marland Kitchen glucose blood (ONE TOUCH ULTRA TEST) test strip Use as instructed 100 each 12  . hydrochlorothiazide (HYDRODIURIL) 25 MG tablet Take 1 tablet (25 mg total) by mouth daily. 90 tablet 3  . levothyroxine (SYNTHROID) 75 MCG tablet Take 1 tablet (75 mcg total) by mouth daily before breakfast. 90 tablet 1  . metFORMIN (GLUCOPHAGE) 1000 MG tablet Take 1 tablet (1,000 mg total) by mouth 2 (two) times daily with a meal. 180 tablet 3  . Multiple Vitamins-Minerals (MULTIVITAMIN,TX-MINERALS) tablet Take 1 tablet by mouth daily.      Marland Kitchen PARoxetine (PAXIL-CR) 25 MG 24 hr tablet take 1 tablet by mouth once daily 90 tablet 3  . zolpidem (AMBIEN) 10 MG tablet take 1 tablet by mouth at bedtime if needed 90 tablet 1  . dexlansoprazole (DEXILANT) 60 MG capsule Take 60 mg by mouth daily.    Marland Kitchen ezetimibe (ZETIA) 10 MG tablet Take 1 tablet (10 mg total) by mouth daily. 90 tablet 3  . metoprolol (LOPRESSOR) 50 MG tablet Take 50 mg by mouth daily.     Facility-Administered Medications Prior to Visit  Medication Dose Route  Frequency Provider Last Rate Last Dose  . cyanocobalamin ((VITAMIN B-12)) injection 1,000 mcg  1,000 mcg Intramuscular Once [name], MD        ROS Review of Systems  Constitutional: Negative.  Negative for fever, chills, diaphoresis, appetite change and fatigue.  HENT: Positive for postnasal drip and rhinorrhea. Negative for sinus pressure, sore throat, tinnitus, trouble swallowing and voice change.   Eyes: Negative.   Respiratory: Positive for apnea, cough, shortness of breath and wheezing. Negative for choking, chest tightness and stridor.   Cardiovascular: Negative.  Negative for chest pain, palpitations and leg swelling.  Gastrointestinal: Negative.  Negative for nausea, vomiting, abdominal pain, diarrhea, constipation and blood in stool.  Endocrine: Negative.   Genitourinary: Negative.   Musculoskeletal: Negative.  Negative for myalgias, back pain and neck pain.  Skin: Negative for rash.  Allergic/Immunologic: Negative.   Neurological: Negative.  Negative for dizziness, tremors, weakness, light-headedness, numbness and headaches.  Hematological: Negative.   Psychiatric/Behavioral: Negative.     Objective:  BP 130/82 mmHg  Pulse 71  Temp(Src) 98.1 F (36.7 C) (Oral)  Resp 16  Ht 5' 7"  (1.702 m)  Wt 219 lb (99.338 kg)  BMI 34.29 kg/m2  SpO2 97%  BP Readings from Last 3 Encounters:  10/04/14 130/82  06/02/14 124/84  12/31/13 126/70    Wt Readings from Last 3 Encounters:  10/04/14  219 lb (99.338 kg)  06/02/14 220 lb (99.791 kg)  12/31/13 213 lb (96.616 kg)    Physical Exam  Constitutional: She is oriented to person, place, and time. She appears well-developed and well-nourished. No distress.  HENT:  Head: Normocephalic and atraumatic.  Nose: Nose normal.  Mouth/Throat: Oropharynx is clear and moist. No oropharyngeal exudate.  Eyes: Conjunctivae are normal. Right eye exhibits no discharge. Left eye exhibits no discharge. No scleral icterus.  Neck: Normal  range of motion. Neck supple. No JVD present. No tracheal deviation present. No thyromegaly present.  Cardiovascular: Normal rate, regular rhythm, normal heart sounds and intact distal pulses.  Exam reveals no gallop and no friction rub.   No murmur heard. Sinus  Rhythm  -Nonspecific QRS widening.   -  T-abnormality  - Anterior and inferior ischemia.   ABNORMAL - unchanged compared to prior EKGs  Pulmonary/Chest: Effort normal and breath sounds normal. No stridor. No respiratory distress. She has no wheezes. She has no rales. She exhibits no tenderness.  Abdominal: Soft. Bowel sounds are normal. She exhibits no distension and no mass. There is no tenderness. There is no rebound and no guarding.  Musculoskeletal: Normal range of motion. She exhibits no edema or tenderness.  Lymphadenopathy:    She has no cervical adenopathy.  Neurological: She is oriented to person, place, and time.  Skin: Skin is warm and dry. No rash noted. She is not diaphoretic. No erythema. No pallor.  Vitals reviewed.   Lab Results  Component Value Date   WBC 12.3* 10/04/2014   HGB 15.2* 10/04/2014   HCT 45.1 10/04/2014   PLT 341.0 10/04/2014   GLUCOSE 181* 10/04/2014   CHOL 213* 10/04/2014   TRIG 260.0* 10/04/2014   HDL 45.20 10/04/2014   LDLDIRECT 138.0 10/04/2014   LDLCALC SEE COMMENT 11/26/2013   ALT 29 10/04/2014   AST 20 10/04/2014   NA 139 10/04/2014   K 4.5 10/04/2014   CL 99 10/04/2014   CREATININE 0.78 10/04/2014   BUN 17 10/04/2014   CO2 31 10/04/2014   TSH 2.26 10/04/2014   INR 1.1 11/27/2013   HGBA1C 6.8* 10/04/2014   MICROALBUR 1.0 10/12/2013    No results found.  Assessment & Plan:   Kayln was seen today for hypertension, hypothyroidism, diabetes and asthma.  Diagnoses and all orders for this visit:  OSA (obstructive sleep apnea)- she needs a f/up on this -     Ambulatory referral to Pulmonology  Atrial fibrillation, unspecified- she has good rate and rhythm control -      EKG 12-Lead -     TSH; Future  Asthma with acute exacerbation, mild persistent- will start dulera -     mometasone-formoterol (DULERA) 200-5 MCG/ACT AERO; Inhale 2 puffs into the lungs 2 (two) times daily.  Essential hypertension, benign- her BP is well controlled, lytes and renal function are normal -     CBC with Differential/Platelet; Future -     Urinalysis, Routine w reflex microscopic (not at [facility]); Future  Other specified hypothyroidism- her TSH is on the normal range, will cont the current synthroid dose -     TSH; Future  Type II diabetes mellitus with manifestations- her blood sugars are well controlled -     Comprehensive metabolic panel; Future -     Hemoglobin A1c; Future  Hyperlipidemia with target LDL less than 100- her LDL is too high but she refuses to take zetia -     Lipid panel; Future  Mixed hyperlipidemia-  she will work on her diet/exercise/weight loss to lower her trigs -     Lipid panel; Future  Recurrent UTI- her UA is abnormal, will start macrobid -     nitrofurantoin, macrocrystal-monohydrate, (MACROBID) 100 MG capsule; Take 1 capsule (100 mg total) by mouth 2 (two) times daily.   I have discontinued Ms. Gunawan's metoprolol, dexlansoprazole, and ezetimibe. I am also having her start on mometasone-formoterol and nitrofurantoin (macrocrystal-monohydrate). Additionally, I am having her maintain her (multivitamin,tx-minerals), glucose blood, amiodarone, apixaban, PARoxetine, hydrochlorothiazide, metFORMIN, ALPRAZolam, levothyroxine, zolpidem, and metoprolol succinate. We will continue to administer cyanocobalamin.  Meds ordered this encounter  Medications  . metoprolol succinate (TOPROL-XL) 50 MG 24 hr tablet    Sig:     Refill:  0  . mometasone-formoterol (DULERA) 200-5 MCG/ACT AERO    Sig: Inhale 2 puffs into the lungs 2 (two) times daily.    Dispense:  13 g    Refill:  11  . nitrofurantoin, macrocrystal-monohydrate, (MACROBID) 100 MG capsule    Sig:  Take 1 capsule (100 mg total) by mouth 2 (two) times daily.    Dispense:  14 capsule    Refill:  0     Follow-up: Return in about 4 months (around 02/03/2015).  [name], MD

## 2014-10-04 NOTE — Patient Instructions (Signed)

## 2014-10-08 ENCOUNTER — Telehealth: Payer: Self-pay

## 2014-10-08 NOTE — Telephone Encounter (Signed)
A user error has taken place.

## 2014-10-08 NOTE — Telephone Encounter (Signed)
Pa initiated for [facility] via covermymeds KEY: [id number]

## 2014-10-11 ENCOUNTER — Other Ambulatory Visit: Payer: Self-pay | Admitting: Internal Medicine

## 2014-10-11 DIAGNOSIS — F411 Generalized anxiety disorder: Secondary | ICD-10-CM

## 2014-10-11 DIAGNOSIS — F409 Phobic anxiety disorder, unspecified: Secondary | ICD-10-CM

## 2014-10-11 DIAGNOSIS — F5105 Insomnia due to other mental disorder: Secondary | ICD-10-CM

## 2014-10-11 DIAGNOSIS — F418 Other specified anxiety disorders: Secondary | ICD-10-CM

## 2014-10-11 MED ORDER — ALPRAZOLAM 0.25 MG PO TABS: ORAL_TABLET | ORAL | Status: DC

## 2014-10-11 MED ORDER — ZOLPIDEM TARTRATE 10 MG PO TABS: ORAL_TABLET | ORAL | Status: DC

## 2014-10-11 NOTE — Telephone Encounter (Signed)
Pa approved through 02/12/2015. Pharmacy notified.

## 2014-11-02 ENCOUNTER — Encounter: Payer: Self-pay | Admitting: Pulmonary Disease

## 2014-11-02 ENCOUNTER — Ambulatory Visit (INDEPENDENT_AMBULATORY_CARE_PROVIDER_SITE_OTHER): Payer: PPO

## 2014-11-02 ENCOUNTER — Telehealth: Payer: Self-pay | Admitting: Pulmonary Disease

## 2014-11-02 ENCOUNTER — Ambulatory Visit (INDEPENDENT_AMBULATORY_CARE_PROVIDER_SITE_OTHER): Payer: PPO | Admitting: Pulmonary Disease

## 2014-11-02 VITALS — BP 146/90 | HR 72 | Ht 67.0 in | Wt 223.4 lb

## 2014-11-02 DIAGNOSIS — J4530 Mild persistent asthma, uncomplicated: Secondary | ICD-10-CM | POA: Diagnosis not present

## 2014-11-02 DIAGNOSIS — G4733 Obstructive sleep apnea (adult) (pediatric): Secondary | ICD-10-CM | POA: Diagnosis not present

## 2014-11-02 DIAGNOSIS — E538 Deficiency of other specified B group vitamins: Secondary | ICD-10-CM | POA: Diagnosis not present

## 2014-11-02 DIAGNOSIS — Z23 Encounter for immunization: Secondary | ICD-10-CM

## 2014-11-02 MED ORDER — CYANOCOBALAMIN 1000 MCG/ML IJ SOLN
1000.0000 ug | Freq: Once | INTRAMUSCULAR | Status: AC
  Administered 2014-11-02: 1000 ug via INTRAMUSCULAR

## 2014-11-02 MED ORDER — DOXYCYCLINE HYCLATE 100 MG PO TABS: 100.0000 mg | ORAL_TABLET | Freq: Every day | ORAL | Status: DC

## 2014-11-02 NOTE — Assessment & Plan Note (Signed)
CPAP is working well on auto settings   Weight loss encouraged, compliance with goal of at least 4-6 hrs every night is the expectation. Advised against medications with sedative side effects Cautioned against driving when sleepy - understanding that sleepiness will vary on a day to day basis

## 2014-11-02 NOTE — Assessment & Plan Note (Signed)
Well controlled For acute sinusitis - Z-pak  OK to use mucinex or decongestant for sinus issues

## 2014-11-02 NOTE — Telephone Encounter (Signed)
Per RA- Doxy 17m daily for 7 days.   Called and spoke to pt. Informed her of the recs per RA. Rx sent to preferred pharmacy. Pt verbalized understanding and denied any further questions or concerns at this time.

## 2014-11-02 NOTE — Patient Instructions (Addendum)
CPAP is working well on auto settings Z-pak  OK to use mucinex or decongestant for sinus issues

## 2014-11-02 NOTE — Telephone Encounter (Signed)
Called and spoke to pt. Pt stated she is allergic to Penicillins, causing hives.   Dr. Elsworth Soho, please advise. Thanks.

## 2014-11-02 NOTE — Telephone Encounter (Signed)
Use Augmentin 875 twice a day x 5 days instead

## 2014-11-02 NOTE — Progress Notes (Signed)
   Subjective:    Patient ID: Kirsten [name], female    DOB: Sep 19, 1955, 59 y.o.   MRN: [id number]  HPI  59 year old female  for management of obstructive sleep apnea.  Last seen =[facility] - 01/2013  PSG  2010- AHI of 80 events per hour. Maintained on auto CPAP  Chief Complaint  Patient presents with  . Sleep Apnea    doing well on cpap, feeling some SOB, tightness in chest, some cough, feeling a lot of sinus pressure.  Declined flu shot (will get at Dr. Ronnald Ramp' office)   47mdownload >> good usage, avg pr 15 cm , on auto, no residuals Nasal mask DME- [facility] She has no mask and pressure issues, feels rested when she wakes up and denies excessive daytime fatigue or somnolence. She complains of sinus drainage and yellow sputum production  Review of Systems neg for any significant sore throat, dysphagia, itching, sneezing, nasal congestion or excess/ purulent secretions, fever, chills, sweats, unintended wt loss, pleuritic or exertional cp, hempoptysis, orthopnea pnd or change in chronic leg swelling. Also denies presyncope, palpitations, heartburn, abdominal pain, nausea, vomiting, diarrhea or change in bowel or urinary habits, dysuria,hematuria, rash, arthralgias, visual complaints, headache, numbness weakness or ataxia.     Objective:   Physical Exam  Gen. Pleasant, well-nourished, in no distress ENT - no lesions, no post nasal drip Neck: No JVD, no thyromegaly, no carotid bruits Lungs: no use of accessory muscles, no dullness to percussion, clear without rales or rhonchi  Cardiovascular: Rhythm regular, heart sounds  normal, no murmurs or gallops, no peripheral edema Musculoskeletal: No deformities, no cyanosis or clubbing         Assessment & Plan:

## 2014-11-02 NOTE — Telephone Encounter (Signed)
Zpak interacts with Amioderone stating there is high risk for QT prolongation.   Dr. Elsworth Soho please advise. Thanks.

## 2014-11-04 ENCOUNTER — Telehealth: Payer: Self-pay

## 2014-11-04 NOTE — Telephone Encounter (Signed)
Pa initiated via covermymeds 9/2. KEY [id number]  Resent Pa via covermymeds and fax 9/22 KEY [id number]

## 2014-11-08 NOTE — Telephone Encounter (Signed)
Approved via covermymeds Pharmacy notified

## 2014-11-16 ENCOUNTER — Encounter: Payer: Self-pay | Admitting: Pulmonary Disease

## 2014-12-15 ENCOUNTER — Telehealth: Payer: Self-pay

## 2014-12-15 NOTE — Telephone Encounter (Signed)
Pt calls c/o urinary urgency, can not control urine. She went to walk in last week, approx 10 days ago. She was given Macrobid x5 days but has not noticed any improvement. No vaginal discharge or odor. Pt states she has taken Keflex, and Levaquin in the past. Offered a nurse visit but pt would like to see MD.  Appt made for 7:45am tomorrow.

## 2014-12-16 ENCOUNTER — Encounter: Payer: Self-pay | Admitting: Obstetrics and Gynecology

## 2014-12-16 ENCOUNTER — Ambulatory Visit (INDEPENDENT_AMBULATORY_CARE_PROVIDER_SITE_OTHER): Payer: PPO | Admitting: Obstetrics and Gynecology

## 2014-12-16 VITALS — BP 140/82 | HR 67 | Temp 98.4°F | Wt 219.5 lb

## 2014-12-16 DIAGNOSIS — R3915 Urgency of urination: Secondary | ICD-10-CM

## 2014-12-16 LAB — POCT URINALYSIS DIPSTICK
BILIRUBIN UA: NEGATIVE
GLUCOSE UA: NEGATIVE
Ketones, UA: NEGATIVE
Nitrite, UA: NEGATIVE
RBC UA: NEGATIVE
SPEC GRAV UA: 1.025
Urobilinogen, UA: NEGATIVE
pH, UA: 6.5

## 2014-12-16 MED ORDER — CIPROFLOXACIN HCL 500 MG PO TABS: 500.0000 mg | ORAL_TABLET | Freq: Two times a day (BID) | ORAL | Status: DC

## 2014-12-16 NOTE — Progress Notes (Signed)
Patient ID: Kirsten [name], female   DOB: 1955/08/27, 59 y.o.   MRN: [id number] Pt called yesterday with symptons of UTI: urgency with urination.  Chief complaint: 1.  Urinary urgency. 2.  History of recurrent UTIs with resistance.  Patient was treated for UTI with Macrobid from urgent care center.  One week ago.  After 5 days of antibiotic.  She has not had any relief of symptomatology.  She still has urinary urgency.  She denies fever or flank pain.  No blood in her urine.  Past medical history, past surgical history, illness, medications, allergies are reviewed.  Patient has taken Cipro before without difficulty.  Review of systems: Per HPI.  OBJECTIVE: BP 140/82 mmHg  Pulse 67  Temp(Src) 98.4 F (36.9 C)  Wt 219 lb 8 oz (99.565 kg) Pleasant, well-appearing female in no acute distress. Back: No CVA tenderness. Abdomen: Soft, nontender.  No suprapubic tenderness.  ASSESSMENT: 1.  Suspected UTI, refractory to Macrobid.  PLAN: 1.  Increase water intake and cranberry juice. 2.  Cipro 500 mg twice a day for 7 days. 3.  Urine culture to assess sensitivities. 4.  Keep regular appointment as scheduled previously.  Hassell Done A Rasean Joos, MD   Note: This dictation was prepared with Dragon dictation along with smaller phrase technology. Any transcriptional errors that result from this process are unintentional.

## 2014-12-16 NOTE — Patient Instructions (Signed)
1.  Increase water intake and cranberry juice. 2.  Cipro 500 mg 2 times a day for 7 days. 3.  Urine culture to check sensitivity.  Patient will be notified by phone of results. 4.  Return for appointment as scheduled.

## 2014-12-17 LAB — URINE CULTURE

## 2015-01-10 ENCOUNTER — Telehealth: Payer: Self-pay | Admitting: Pulmonary Disease

## 2015-01-10 ENCOUNTER — Telehealth: Payer: Self-pay

## 2015-01-10 DIAGNOSIS — G4733 Obstructive sleep apnea (adult) (pediatric): Secondary | ICD-10-CM

## 2015-01-10 DIAGNOSIS — Z9989 Dependence on other enabling machines and devices: Principal | ICD-10-CM

## 2015-01-10 NOTE — Telephone Encounter (Signed)
[phone] pt calling back

## 2015-01-10 NOTE — Telephone Encounter (Signed)
Called spoke with pt. She reports one of the pronges on the back of her machine broke inside of her CPAP and now it will not turn on. She contacted DME and was told it would take 7-10 days before someone could look at her machine. She has been w/o CPAP x 4 nights I called [facility] and spoke with [place]. She reports pt did call in and they offered to have RT call her to troubleshoot and pt did not want this and wanted an order for a new machine. Per [place] they did then tell her that could take 7-10 days once they get RX from the doctors office. Per [place] pt last received machine in 2005 and should be able to get a new one. Please advise Dr. Elsworth Soho thanks

## 2015-01-10 NOTE — Telephone Encounter (Signed)
Recived paperwork via [facility] on pt request. Regarding shingles vaccine. Advised pt to call ins and get quotes then call office back

## 2015-01-10 NOTE — Telephone Encounter (Signed)
Dr. Alva, please advise. 

## 2015-01-11 NOTE — Telephone Encounter (Signed)
OK to send Rx for autoCPAP 10-18 cm Download & FU with TP  in 4-6 wks

## 2015-01-11 NOTE — Telephone Encounter (Signed)
DME: Kirsten [name] (per [name]) - no longer uses [facility] Order entered [name] states she will call to schedule follow up appointment once she has received machine. Reminder placed for follow up  Nothing further needed. Closing encounter

## 2015-01-13 ENCOUNTER — Ambulatory Visit (INDEPENDENT_AMBULATORY_CARE_PROVIDER_SITE_OTHER): Payer: PPO | Admitting: Obstetrics and Gynecology

## 2015-01-13 ENCOUNTER — Encounter: Payer: Self-pay | Admitting: Obstetrics and Gynecology

## 2015-01-13 VITALS — BP 121/78 | HR 89 | Ht 67.0 in | Wt 217.8 lb

## 2015-01-13 DIAGNOSIS — Z1211 Encounter for screening for malignant neoplasm of colon: Secondary | ICD-10-CM

## 2015-01-13 DIAGNOSIS — R3915 Urgency of urination: Secondary | ICD-10-CM

## 2015-01-13 DIAGNOSIS — Z780 Asymptomatic menopausal state: Secondary | ICD-10-CM | POA: Diagnosis not present

## 2015-01-13 DIAGNOSIS — Z1239 Encounter for other screening for malignant neoplasm of breast: Secondary | ICD-10-CM | POA: Diagnosis not present

## 2015-01-13 DIAGNOSIS — Z0000 Encounter for general adult medical examination without abnormal findings: Secondary | ICD-10-CM | POA: Diagnosis not present

## 2015-01-13 DIAGNOSIS — Z01419 Encounter for gynecological examination (general) (routine) without abnormal findings: Secondary | ICD-10-CM

## 2015-01-13 DIAGNOSIS — N895 Stricture and atresia of vagina: Secondary | ICD-10-CM

## 2015-01-13 DIAGNOSIS — N952 Postmenopausal atrophic vaginitis: Secondary | ICD-10-CM | POA: Diagnosis not present

## 2015-01-13 DIAGNOSIS — R319 Hematuria, unspecified: Secondary | ICD-10-CM

## 2015-01-13 LAB — POCT URINALYSIS DIPSTICK
Bilirubin, UA: 1
Glucose, UA: NEGATIVE
Ketones, UA: NEGATIVE
NITRITE UA: NEGATIVE
SPEC GRAV UA: 1.02
UROBILINOGEN UA: 0.2
pH, UA: 6

## 2015-01-13 MED ORDER — NYSTATIN-TRIAMCINOLONE 100000-0.1 UNIT/GM-% EX CREA: 1.0000 "application " | TOPICAL_CREAM | Freq: Two times a day (BID) | CUTANEOUS | Status: DC

## 2015-01-13 NOTE — Progress Notes (Signed)
Patient ID: Kirsten [name], female   DOB: November 15, 1955, 59 y.o.   MRN: [id number] ANNUAL PREVENTATIVE CARE GYN  ENCOUNTER NOTE  Subjective:       Kirsten [name] is a 59 y.o. No obstetric history on file. female here for a routine annual gynecologic exam.  Current complaints: 1.  Hematuria- noticed yesterday- urgency    Gynecologic History No LMP recorded. Patient is postmenopausal. Contraception: tubal ligation Last Pap: 02/2013 -neg/neg. Results were: normal Last mammogram: 1/30/215 birad. Results were: normal  Obstetric History OB History  No data available    Past Medical History  Diagnosis Date  . Morbid obesity ([place])     Target wt - 185  for BMI < 30  . Type II or unspecified type diabetes mellitus without mention of complication, not stated as uncontrolled   . HTN (hypertension)   . Sleep apnea   . Pulmonary fibrosis ([place])   . Hyperlipidemia   . Gastroschisis     umphalocele, rotated organs abd repair until age 7  . Anxiety disorder   . GERD (gastroesophageal reflux disease)   . IBS (irritable bowel syndrome)   . Obesity   . SBO (small bowel obstruction) ([place])     Resolved with NG/Bowel rest around 2009  . A-fib [facility])     Past Surgical History  Procedure Laterality Date  . Cholecystectomy  1992  . Tubal ligation  1988  . Newborn surgery - gi - organs outside abdomen    . Small bowel repair      Current Outpatient Prescriptions on File Prior to Visit  Medication Sig Dispense Refill  . ALPRAZolam (XANAX) 0.25 MG tablet take 1 tablet by mouth once daily if needed 90 tablet 1  . amiodarone (PACERONE) 200 MG tablet Take 200 mg by mouth daily.    Marland Kitchen apixaban (ELIQUIS) 5 MG TABS tablet Take 5 mg by mouth 2 (two) times daily.    Marland Kitchen glucose blood (ONE TOUCH ULTRA TEST) test strip Use as instructed 100 each 12  . hydrochlorothiazide (HYDRODIURIL) 25 MG tablet Take 1 tablet (25 mg total) by mouth daily. 90 tablet 3  . levothyroxine (SYNTHROID) 75 MCG tablet Take 1  tablet (75 mcg total) by mouth daily before breakfast. 90 tablet 1  . metFORMIN (GLUCOPHAGE) 1000 MG tablet Take 1 tablet (1,000 mg total) by mouth 2 (two) times daily with a meal. 180 tablet 3  . metoprolol succinate (TOPROL-XL) 50 MG 24 hr tablet   0  . mometasone-formoterol (DULERA) 200-5 MCG/ACT AERO Inhale 2 puffs into the lungs 2 (two) times daily. 13 g 11  . Multiple Vitamins-Minerals (MULTIVITAMIN,TX-MINERALS) tablet Take 1 tablet by mouth daily.      Marland Kitchen PARoxetine (PAXIL-CR) 25 MG 24 hr tablet take 1 tablet by mouth once daily 90 tablet 3  . zolpidem (AMBIEN) 10 MG tablet take 1 tablet by mouth at bedtime if needed 90 tablet 1   Current Facility-Administered Medications on File Prior to Visit  Medication Dose Route Frequency Provider Last Rate Last Dose  . cyanocobalamin ((VITAMIN B-12)) injection 1,000 mcg  1,000 mcg Intramuscular Once [name], MD        Allergies  Allergen Reactions  . Factive [Gemifloxacin Mesylate] Rash  . Crestor [Rosuvastatin]     GI upset  . Penicillins     REACTION: rash  . Sulfonamide Derivatives     REACTION: rash  . Gemifloxacin Rash    Social History   Social History  . Marital Status:  Married    Spouse Name: N/A  . Number of Children: N/A  . Years of Education: N/A   Occupational History  . Retired     Research scientist (physical sciences) at [employer]  . Smoking status: Never Smoker   . Smokeless tobacco: Not on file     Comment: "tried as a teen"  . Alcohol Use: 1.2 oz/week    2 Glasses of wine per week     Comment: rare  . Drug Use: No  . Sexual Activity: Yes    Birth Control/ Protection: Surgical   Other Topics Concern  . Not on file   Social History Narrative   Regular Exercise -  YES   Daily Caffeine Use:  2          Family History  Problem Relation Age of Onset  . Lymphoma Mother   . Diabetes type II Father   . Diabetes type I Son   . Goiter Neg Hx   . Colon cancer Neg Hx   . Diabetes  Maternal Grandmother     The following portions of the patient's history were reviewed and updated as appropriate: allergies, current medications, past family history, past medical history, past social history, past surgical history and problem list.  Review of Systems ROS Review of Systems - General ROS: negative for - chills, fatigue, fever, hot flashes, night sweats, weight gain or weight loss Psychological ROS: negative for - anxiety, decreased libido, depression, mood swings, physical abuse or sexual abuse Ophthalmic ROS: negative for - blurry vision, eye pain or loss of vision ENT ROS: negative for - headaches, hearing change, visual changes or vocal changes Allergy and Immunology ROS: negative for - hives, itchy/watery eyes or seasonal allergies Hematological and Lymphatic ROS: negative for - bleeding problems, bruising, swollen lymph nodes or weight loss Endocrine ROS: negative for - galactorrhea, hair pattern changes, hot flashes, malaise/lethargy, mood swings, palpitations, polydipsia/polyuria, skin changes, temperature intolerance or unexpected weight changes Breast ROS: negative for - new or changing breast lumps or nipple discharge Respiratory ROS: negative for - cough or shortness of breath Cardiovascular ROS: negative for - chest pain, irregular heartbeat, palpitations or shortness of breath Gastrointestinal ROS: no abdominal pain, change in bowel habits, or black or bloody stools Genito-Urinary ROS: no dysuria, trouble voiding, or hematuria Musculoskeletal ROS: negative for - joint pain or joint stiffness Neurological ROS: negative for - bowel and bladder control changes Dermatological ROS: negative for rash and skin lesion changes   Objective:   BP 121/78 mmHg  Pulse 89  Ht 5' 7"  (1.702 m)  Wt 217 lb 12.8 oz (98.793 kg)  BMI 34.10 kg/m2 CONSTITUTIONAL: Well-developed, well-nourished female in no acute distress.  PSYCHIATRIC: Normal mood and affect. Normal behavior.  Normal judgment and thought content. [place]: Alert and oriented to person, place, and time. Normal muscle tone coordination. No cranial nerve deficit noted. HENT:  Normocephalic, atraumatic, External right and left ear normal. Oropharynx is clear and moist EYES: Conjunctivae and EOM are normal. Pupils are equal, round, and reactive to light. No scleral icterus.  NECK: Normal range of motion, supple, no masses.  Normal thyroid.  SKIN: Skin is warm and dry. No rash noted. Not diaphoretic. No erythema. No pallor. CARDIOVASCULAR: Normal heart rate noted, regular rhythm, no murmur. RESPIRATORY: Clear to auscultation bilaterally. Effort and breath sounds normal, no problems with respiration noted. BREASTS: Symmetric in size. No masses, skin changes, nipple drainage, or lymphadenopathy. ABDOMEN: Soft, normal bowel sounds,  no distention noted.  No tenderness, rebound or guarding. Multiple surgical scars healed BLADDER: Normal PELVIC:  External Genitalia: Normal  BUS: Normal  Vagina: Atrophic; introitus narrowed, requiring pediatric speculum  Cervix: Normal  Uterus: Midplane, not enlarged  Adnexa: Nonpalpable, nontender  RV: perianal erythema, No Rectal Masses and Normal Sphincter tone  MUSCULOSKELETAL: Normal range of motion. No tenderness.  No cyanosis, clubbing, or edema.  2+ distal pulses. LYMPHATIC: No Axillary, Supraclavicular, or Inguinal Adenopathy.    Assessment:   Annual gynecologic examination 59 y.o. Contraception: tubal ligation bmi-34. Menopausal state, Asymptomatic. Perianal erythema UTI symptoms    Plan:  Pap: Not needed Mammogram: Ordered Stool Guaiac Testing:  Ordered Labs: thru pcp Routine preventative health maintenance measures emphasized: Exercise/Diet/Weight control, Tobacco Warnings, Alcohol/Substance use risks and Stress Management Nystatin triamcinolone cream twice a day for 14 days Urine culture. Increase water and cranberry juice intake Return to  [place], [place]  [name], MD  Note: This dictation was prepared with Dragon dictation along with smaller phrase technology. Any transcriptional errors that result from this process are unintentional.  '

## 2015-01-13 NOTE — Patient Instructions (Signed)
1.  No Pap smear this year. 2 .  Mammogram ordered. 3.  Colon cancer screening through guaiac cards ordered. 4.  Screening Lab work from primary care. 5.  Continue with healthy eating and exercise with weight loss. 6.  Urine culture sent, because of UTI symptoms. 7.  Increase water and cranberry juice intake. 8.  Return in one year for annual exam.

## 2015-01-14 LAB — URINE CULTURE

## 2015-01-18 ENCOUNTER — Other Ambulatory Visit: Payer: Self-pay

## 2015-01-18 DIAGNOSIS — F329 Major depressive disorder, single episode, unspecified: Secondary | ICD-10-CM

## 2015-01-18 DIAGNOSIS — F32A Depression, unspecified: Secondary | ICD-10-CM

## 2015-01-18 MED ORDER — PAROXETINE HCL ER 25 MG PO TB24: ORAL_TABLET | ORAL | Status: DC

## 2015-01-18 MED ORDER — LEVOTHYROXINE SODIUM 75 MCG PO TABS: 75.0000 ug | ORAL_TABLET | Freq: Every day | ORAL | Status: DC

## 2015-02-15 ENCOUNTER — Other Ambulatory Visit: Payer: Self-pay

## 2015-02-15 DIAGNOSIS — I10 Essential (primary) hypertension: Secondary | ICD-10-CM

## 2015-02-15 MED ORDER — HYDROCHLOROTHIAZIDE 25 MG PO TABS: 25.0000 mg | ORAL_TABLET | Freq: Every day | ORAL | Status: DC

## 2015-02-18 DIAGNOSIS — G4733 Obstructive sleep apnea (adult) (pediatric): Secondary | ICD-10-CM | POA: Diagnosis not present

## 2015-02-28 ENCOUNTER — Encounter: Payer: Self-pay | Admitting: Adult Health

## 2015-02-28 ENCOUNTER — Ambulatory Visit (INDEPENDENT_AMBULATORY_CARE_PROVIDER_SITE_OTHER): Payer: PPO | Admitting: Adult Health

## 2015-02-28 VITALS — BP 124/72 | HR 72 | Temp 97.8°F | Ht 67.0 in | Wt 219.0 lb

## 2015-02-28 DIAGNOSIS — G4733 Obstructive sleep apnea (adult) (pediatric): Secondary | ICD-10-CM

## 2015-02-28 NOTE — Assessment & Plan Note (Signed)
Well controlled on CPAP   Plan  Continue on CPAP At bedtime   Wear each night , goal for at least 6hr  Work on weight loss.  Do not drive if sleepy  Follow up Dr. Elsworth Soho  In 1 year and As needed

## 2015-02-28 NOTE — Progress Notes (Signed)
Reviewed & agree with plan  

## 2015-02-28 NOTE — Progress Notes (Signed)
Subjective:    Patient ID: Kirsten [name], female    DOB: 1955/12/06, 60 y.o.   MRN: [id number]  HPI 60 yo female with severe OSA   TEST  NPSG 2010:  AHI 80/hr with desat to 82%  02/28/2015 Follow up OSA Pt returns for follow up for sleep apnea.  Pt says she is doing very well on CPAP .  Well rested. Recently had to get new machine due to broken part.  Upset that her DME took so long to help her.  Care coordinator for our office helped her with process.  Download shows  Excellent compliance with average usage of 8 hours. She is on a set pressure at 8 cm of H2O. AHI 1.5, leaks minimal.  She denies any chest pain, orthopnea, PND, or increased leg swelling.     Past Medical History  Diagnosis Date  . Morbid obesity ([place])     Target wt - 185  for BMI < 30  . Type II or unspecified type diabetes mellitus without mention of complication, not stated as uncontrolled   . HTN (hypertension)   . Sleep apnea   . Pulmonary fibrosis ([place])   . Hyperlipidemia   . Gastroschisis     umphalocele, rotated organs abd repair until age 70  . Anxiety disorder   . GERD (gastroesophageal reflux disease)   . IBS (irritable bowel syndrome)   . Obesity   . SBO (small bowel obstruction) ([place])     Resolved with NG/Bowel rest around 2009  . A-fib [facility])    Current Outpatient Prescriptions on File Prior to Visit  Medication Sig Dispense Refill  . ALPRAZolam (XANAX) 0.25 MG tablet take 1 tablet by mouth once daily if needed 90 tablet 1  . amiodarone (PACERONE) 200 MG tablet Take 200 mg by mouth daily.    Marland Kitchen apixaban (ELIQUIS) 5 MG TABS tablet Take 5 mg by mouth 2 (two) times daily.    Marland Kitchen glucose blood (ONE TOUCH ULTRA TEST) test strip Use as instructed 100 each 12  . hydrochlorothiazide (HYDRODIURIL) 25 MG tablet Take 1 tablet (25 mg total) by mouth daily. 90 tablet 3  . levothyroxine (SYNTHROID) 75 MCG tablet Take 1 tablet (75 mcg total) by mouth daily before breakfast. 90 tablet 1  . metFORMIN  (GLUCOPHAGE) 1000 MG tablet Take 1 tablet (1,000 mg total) by mouth 2 (two) times daily with a meal. 180 tablet 3  . metoprolol succinate (TOPROL-XL) 50 MG 24 hr tablet   0  . mometasone-formoterol (DULERA) 200-5 MCG/ACT AERO Inhale 2 puffs into the lungs 2 (two) times daily. 13 g 11  . Multiple Vitamins-Minerals (MULTIVITAMIN,TX-MINERALS) tablet Take 1 tablet by mouth daily.      Marland Kitchen nystatin-triamcinolone (MYCOLOG II) cream Apply 1 application topically 2 (two) times daily. 30 g 0  . PARoxetine (PAXIL-CR) 25 MG 24 hr tablet take 1 tablet by mouth once daily 90 tablet 3  . zolpidem (AMBIEN) 10 MG tablet take 1 tablet by mouth at bedtime if needed 90 tablet 1   Current Facility-Administered Medications on File Prior to Visit  Medication Dose Route Frequency Provider Last Rate Last Dose  . cyanocobalamin ((VITAMIN B-12)) injection 1,000 mcg  1,000 mcg Intramuscular Once [name], MD         Review of Systems Constitutional:   No  weight loss, night sweats,  Fevers, chills, fatigue, or  lassitude.  HEENT:   No headaches,  Difficulty swallowing,  Tooth/dental problems, or  Sore throat,  No sneezing, itching, ear ache, nasal congestion, post nasal drip,   CV:  No chest pain,  Orthopnea, PND, swelling in lower extremities, anasarca, dizziness, palpitations, syncope.   GI  No heartburn, indigestion, abdominal pain, nausea, vomiting, diarrhea, change in bowel habits, loss of appetite, bloody stools.   Resp: No shortness of breath with exertion or at rest.  No excess mucus, no productive cough,  No non-productive cough,  No coughing up of blood.  No change in color of mucus.  No wheezing.  No chest wall deformity  Skin: no rash or lesions.  GU: no dysuria, change in color of urine, no urgency or frequency.  No flank pain, no hematuria   MS:  No joint pain or swelling.  No decreased range of motion.  No back pain.  Psych:  No change in mood or affect. No depression or  anxiety.  No memory loss.         Objective:   Physical Exam  Filed Vitals:   02/28/15 1156  BP: 124/72  Pulse: 72  Temp: 97.8 F (36.6 C)  TempSrc: Oral  Height: 5' 7"  (1.702 m)  Weight: 219 lb (99.338 kg)  SpO2: 94%   Body mass index is 34.29 kg/(m^2).   GEN: A/Ox3; pleasant , NAD, obese   HEENT:  [place]/AT,  EACs-clear, TMs-wnl, NOSE-clear, THROAT-clear, no lesions, no postnasal drip or exudate noted. Class 2-3 MP airway   NECK:  Supple w/ fair ROM; no JVD; normal carotid impulses w/o bruits; no thyromegaly or nodules palpated; no lymphadenopathy.  RESP  Clear  P & A; w/o, wheezes/ rales/ or rhonchi.no accessory muscle use, no dullness to percussion  CARD:  RRR, no m/r/g  , no peripheral edema, pulses intact, no cyanosis or clubbing.  GI:   Soft & nt; nml bowel sounds; no organomegaly or masses detected.  Musco: Warm bil, no deformities or joint swelling noted.   Neuro: alert, no focal deficits noted.    Skin: Warm, no lesions or rashes        Assessment & Plan:

## 2015-02-28 NOTE — Patient Instructions (Signed)
Continue on CPAP At bedtime   Wear each night , goal for at least 6hr  Work on weight loss.  Do not drive if sleepy  Follow up Dr. Elsworth Soho  In 1 year and As needed

## 2015-03-03 ENCOUNTER — Telehealth (INDEPENDENT_AMBULATORY_CARE_PROVIDER_SITE_OTHER): Payer: PPO | Admitting: Obstetrics and Gynecology

## 2015-03-03 ENCOUNTER — Other Ambulatory Visit: Payer: PPO

## 2015-03-03 ENCOUNTER — Other Ambulatory Visit: Payer: Self-pay | Admitting: Obstetrics and Gynecology

## 2015-03-03 DIAGNOSIS — R300 Dysuria: Secondary | ICD-10-CM

## 2015-03-03 LAB — POCT URINALYSIS DIPSTICK
BILIRUBIN UA: 1
GLUCOSE UA: NEGATIVE
Ketones, UA: NEGATIVE
Nitrite, UA: POSITIVE
Spec Grav, UA: 1.02
Urobilinogen, UA: 0.2
pH, UA: 6

## 2015-03-03 MED ORDER — NITROFURANTOIN MONOHYD MACRO 100 MG PO CAPS: 100.0000 mg | ORAL_CAPSULE | Freq: Two times a day (BID) | ORAL | Status: DC

## 2015-03-03 NOTE — Telephone Encounter (Signed)
Patient called complaining of urgency, pain,and burning with urination. She wanted to know if she could drop off a specimen. She can be reached at [phone].Thanks

## 2015-03-03 NOTE — Telephone Encounter (Signed)
U/a and cns obtained. U/a compromised by azo. Will send in [place] and await culture. Pt advised to push fluids.

## 2015-03-06 LAB — URINE CULTURE

## 2015-03-07 ENCOUNTER — Other Ambulatory Visit: Payer: Self-pay

## 2015-03-10 ENCOUNTER — Other Ambulatory Visit: Payer: Self-pay | Admitting: Obstetrics and Gynecology

## 2015-03-15 DIAGNOSIS — M9901 Segmental and somatic dysfunction of cervical region: Secondary | ICD-10-CM | POA: Diagnosis not present

## 2015-03-15 DIAGNOSIS — M9903 Segmental and somatic dysfunction of lumbar region: Secondary | ICD-10-CM | POA: Diagnosis not present

## 2015-03-15 DIAGNOSIS — R51 Headache: Secondary | ICD-10-CM | POA: Diagnosis not present

## 2015-03-15 DIAGNOSIS — M5010 Cervical disc disorder with radiculopathy, unspecified cervical region: Secondary | ICD-10-CM | POA: Diagnosis not present

## 2015-03-21 DIAGNOSIS — G4733 Obstructive sleep apnea (adult) (pediatric): Secondary | ICD-10-CM | POA: Diagnosis not present

## 2015-04-01 ENCOUNTER — Encounter: Payer: Self-pay | Admitting: Adult Health

## 2015-04-18 DIAGNOSIS — G4733 Obstructive sleep apnea (adult) (pediatric): Secondary | ICD-10-CM | POA: Diagnosis not present

## 2015-05-10 DIAGNOSIS — G4733 Obstructive sleep apnea (adult) (pediatric): Secondary | ICD-10-CM | POA: Diagnosis not present

## 2015-05-19 DIAGNOSIS — R002 Palpitations: Secondary | ICD-10-CM | POA: Diagnosis not present

## 2015-05-19 DIAGNOSIS — R079 Chest pain, unspecified: Secondary | ICD-10-CM | POA: Diagnosis not present

## 2015-05-19 DIAGNOSIS — K219 Gastro-esophageal reflux disease without esophagitis: Secondary | ICD-10-CM | POA: Diagnosis not present

## 2015-05-19 DIAGNOSIS — G4733 Obstructive sleep apnea (adult) (pediatric): Secondary | ICD-10-CM | POA: Diagnosis not present

## 2015-05-19 DIAGNOSIS — I4891 Unspecified atrial fibrillation: Secondary | ICD-10-CM | POA: Diagnosis not present

## 2015-05-23 DIAGNOSIS — I4891 Unspecified atrial fibrillation: Secondary | ICD-10-CM | POA: Diagnosis not present

## 2015-05-23 DIAGNOSIS — R079 Chest pain, unspecified: Secondary | ICD-10-CM | POA: Diagnosis not present

## 2015-05-27 DIAGNOSIS — I493 Ventricular premature depolarization: Secondary | ICD-10-CM | POA: Diagnosis not present

## 2015-05-27 DIAGNOSIS — R002 Palpitations: Secondary | ICD-10-CM | POA: Diagnosis not present

## 2015-05-27 DIAGNOSIS — I4891 Unspecified atrial fibrillation: Secondary | ICD-10-CM | POA: Diagnosis not present

## 2015-05-31 DIAGNOSIS — I4891 Unspecified atrial fibrillation: Secondary | ICD-10-CM | POA: Diagnosis not present

## 2015-05-31 DIAGNOSIS — R002 Palpitations: Secondary | ICD-10-CM | POA: Diagnosis not present

## 2015-05-31 DIAGNOSIS — I493 Ventricular premature depolarization: Secondary | ICD-10-CM | POA: Diagnosis not present

## 2015-06-18 DIAGNOSIS — G4733 Obstructive sleep apnea (adult) (pediatric): Secondary | ICD-10-CM | POA: Diagnosis not present

## 2015-06-20 ENCOUNTER — Other Ambulatory Visit (INDEPENDENT_AMBULATORY_CARE_PROVIDER_SITE_OTHER): Payer: PPO

## 2015-06-20 DIAGNOSIS — R309 Painful micturition, unspecified: Secondary | ICD-10-CM | POA: Diagnosis not present

## 2015-06-20 LAB — POCT URINALYSIS DIPSTICK
Bilirubin, UA: 1
GLUCOSE UA: NEGATIVE
Ketones, UA: NEGATIVE
Nitrite, UA: POSITIVE
Spec Grav, UA: 1.01
UROBILINOGEN UA: 0.2
pH, UA: 6.5

## 2015-06-20 MED ORDER — NITROFURANTOIN MONOHYD MACRO 100 MG PO CAPS: 100.0000 mg | ORAL_CAPSULE | Freq: Two times a day (BID) | ORAL | Status: DC

## 2015-06-20 NOTE — Progress Notes (Signed)
Pt states she has painful urination x 3 days. Pos for h/a and diarrhea. NO fever or back pain. Pt aware u/a was pos for nitrates, leuks, and blood. Will send in macrobid and culture. Pt aware to push fluids. May take azo and tylenol. If no relief after atb she will need to be seen.

## 2015-06-21 DIAGNOSIS — M5010 Cervical disc disorder with radiculopathy, unspecified cervical region: Secondary | ICD-10-CM | POA: Diagnosis not present

## 2015-06-21 DIAGNOSIS — M9903 Segmental and somatic dysfunction of lumbar region: Secondary | ICD-10-CM | POA: Diagnosis not present

## 2015-06-21 DIAGNOSIS — M9901 Segmental and somatic dysfunction of cervical region: Secondary | ICD-10-CM | POA: Diagnosis not present

## 2015-06-21 DIAGNOSIS — R51 Headache: Secondary | ICD-10-CM | POA: Diagnosis not present

## 2015-06-22 LAB — URINE CULTURE

## 2015-06-23 DIAGNOSIS — M9903 Segmental and somatic dysfunction of lumbar region: Secondary | ICD-10-CM | POA: Diagnosis not present

## 2015-06-23 DIAGNOSIS — M9901 Segmental and somatic dysfunction of cervical region: Secondary | ICD-10-CM | POA: Diagnosis not present

## 2015-06-23 DIAGNOSIS — R51 Headache: Secondary | ICD-10-CM | POA: Diagnosis not present

## 2015-06-23 DIAGNOSIS — M5010 Cervical disc disorder with radiculopathy, unspecified cervical region: Secondary | ICD-10-CM | POA: Diagnosis not present

## 2015-07-01 DIAGNOSIS — R05 Cough: Secondary | ICD-10-CM | POA: Diagnosis not present

## 2015-07-01 DIAGNOSIS — J209 Acute bronchitis, unspecified: Secondary | ICD-10-CM | POA: Diagnosis not present

## 2015-07-06 DIAGNOSIS — G4733 Obstructive sleep apnea (adult) (pediatric): Secondary | ICD-10-CM | POA: Diagnosis not present

## 2015-07-18 ENCOUNTER — Encounter: Payer: Self-pay | Admitting: Internal Medicine

## 2015-07-18 ENCOUNTER — Ambulatory Visit (INDEPENDENT_AMBULATORY_CARE_PROVIDER_SITE_OTHER): Payer: PPO | Admitting: Internal Medicine

## 2015-07-18 ENCOUNTER — Other Ambulatory Visit (INDEPENDENT_AMBULATORY_CARE_PROVIDER_SITE_OTHER): Payer: PPO

## 2015-07-18 VITALS — BP 110/70 | HR 73 | Temp 98.4°F | Resp 16 | Ht 67.0 in | Wt 218.0 lb

## 2015-07-18 DIAGNOSIS — I480 Paroxysmal atrial fibrillation: Secondary | ICD-10-CM

## 2015-07-18 DIAGNOSIS — E781 Pure hyperglyceridemia: Secondary | ICD-10-CM | POA: Diagnosis not present

## 2015-07-18 DIAGNOSIS — E038 Other specified hypothyroidism: Secondary | ICD-10-CM

## 2015-07-18 DIAGNOSIS — F411 Generalized anxiety disorder: Secondary | ICD-10-CM

## 2015-07-18 DIAGNOSIS — B880 Other acariasis: Secondary | ICD-10-CM | POA: Insufficient documentation

## 2015-07-18 DIAGNOSIS — F418 Other specified anxiety disorders: Secondary | ICD-10-CM

## 2015-07-18 DIAGNOSIS — Z794 Long term (current) use of insulin: Secondary | ICD-10-CM

## 2015-07-18 DIAGNOSIS — I10 Essential (primary) hypertension: Secondary | ICD-10-CM

## 2015-07-18 DIAGNOSIS — E785 Hyperlipidemia, unspecified: Secondary | ICD-10-CM | POA: Diagnosis not present

## 2015-07-18 DIAGNOSIS — N390 Urinary tract infection, site not specified: Secondary | ICD-10-CM

## 2015-07-18 DIAGNOSIS — F5105 Insomnia due to other mental disorder: Secondary | ICD-10-CM

## 2015-07-18 DIAGNOSIS — F409 Phobic anxiety disorder, unspecified: Secondary | ICD-10-CM

## 2015-07-18 DIAGNOSIS — E118 Type 2 diabetes mellitus with unspecified complications: Secondary | ICD-10-CM

## 2015-07-18 DIAGNOSIS — E538 Deficiency of other specified B group vitamins: Secondary | ICD-10-CM

## 2015-07-18 LAB — URINALYSIS, ROUTINE W REFLEX MICROSCOPIC
BILIRUBIN URINE: NEGATIVE
Hgb urine dipstick: NEGATIVE
KETONES UR: NEGATIVE
Nitrite: NEGATIVE
PH: 5 (ref 5.0–8.0)
RBC / HPF: NONE SEEN (ref 0–?)
SPECIFIC GRAVITY, URINE: 1.02 (ref 1.000–1.030)
TOTAL PROTEIN, URINE-UPE24: NEGATIVE
URINE GLUCOSE: NEGATIVE
UROBILINOGEN UA: 0.2 (ref 0.0–1.0)

## 2015-07-18 LAB — HEMOGLOBIN A1C: HEMOGLOBIN A1C: 6.7 % — AB (ref 4.6–6.5)

## 2015-07-18 LAB — CBC WITH DIFFERENTIAL/PLATELET
BASOS ABS: 0.1 10*3/uL (ref 0.0–0.1)
BASOS PCT: 1 % (ref 0.0–3.0)
Eosinophils Absolute: 0.2 10*3/uL (ref 0.0–0.7)
Eosinophils Relative: 2.7 % (ref 0.0–5.0)
HEMATOCRIT: 42.1 % (ref 36.0–46.0)
Hemoglobin: 14.1 g/dL (ref 12.0–15.0)
LYMPHS ABS: 3.1 10*3/uL (ref 0.7–4.0)
Lymphocytes Relative: 36.2 % (ref 12.0–46.0)
MCHC: 33.5 g/dL (ref 30.0–36.0)
MCV: 88.3 fl (ref 78.0–100.0)
MONOS PCT: 6.3 % (ref 3.0–12.0)
Monocytes Absolute: 0.5 10*3/uL (ref 0.1–1.0)
NEUTROS ABS: 4.5 10*3/uL (ref 1.4–7.7)
NEUTROS PCT: 53.8 % (ref 43.0–77.0)
PLATELETS: 318 10*3/uL (ref 150.0–400.0)
RBC: 4.77 Mil/uL (ref 3.87–5.11)
RDW: 14 % (ref 11.5–15.5)
WBC: 8.4 10*3/uL (ref 4.0–10.5)

## 2015-07-18 LAB — COMPREHENSIVE METABOLIC PANEL
ALT: 22 U/L (ref 0–35)
AST: 17 U/L (ref 0–37)
Albumin: 4.2 g/dL (ref 3.5–5.2)
Alkaline Phosphatase: 72 U/L (ref 39–117)
BILIRUBIN TOTAL: 0.4 mg/dL (ref 0.2–1.2)
BUN: 12 mg/dL (ref 6–23)
CHLORIDE: 100 meq/L (ref 96–112)
CO2: 30 meq/L (ref 19–32)
CREATININE: 0.77 mg/dL (ref 0.40–1.20)
Calcium: 9.9 mg/dL (ref 8.4–10.5)
GFR: 81.85 mL/min (ref >60.00)
GLUCOSE: 183 mg/dL — AB (ref 70–99)
Potassium: 3.6 mEq/L (ref 3.5–5.1)
Sodium: 140 mEq/L (ref 135–145)
Total Protein: 6.6 g/dL (ref 6.0–8.3)

## 2015-07-18 LAB — LIPID PANEL
Cholesterol: 201 mg/dL — ABNORMAL HIGH (ref 0–200)
HDL: 39.2 mg/dL (ref >39.00)
NONHDL: 161.51
Total CHOL/HDL Ratio: 5
Triglycerides: 336 mg/dL — ABNORMAL HIGH (ref 0.0–149.0)
VLDL: 67.2 mg/dL — ABNORMAL HIGH (ref 0.0–40.0)

## 2015-07-18 LAB — LDL CHOLESTEROL, DIRECT: Direct LDL: 111 mg/dL

## 2015-07-18 LAB — MICROALBUMIN / CREATININE URINE RATIO
CREATININE, U: 57.1 mg/dL
Microalb Creat Ratio: 1.2 mg/g (ref 0.0–30.0)
Microalb, Ur: <0.7 mg/dL (ref 0.0–1.9)

## 2015-07-18 LAB — TSH: TSH: 3.23 u[IU]/mL (ref 0.35–4.50)

## 2015-07-18 MED ORDER — ALPRAZOLAM 0.25 MG PO TABS: ORAL_TABLET | ORAL | Status: DC

## 2015-07-18 MED ORDER — CLOBETASOL PROPIONATE 0.05 % EX OINT: 1.0000 "application " | TOPICAL_OINTMENT | Freq: Two times a day (BID) | CUTANEOUS | Status: DC

## 2015-07-18 MED ORDER — CYANOCOBALAMIN 1000 MCG/ML IJ SOLN
1000.0000 ug | Freq: Once | INTRAMUSCULAR | Status: AC
  Administered 2015-07-18: 1000 ug via INTRAMUSCULAR

## 2015-07-18 MED ORDER — METFORMIN HCL 1000 MG PO TABS: 1000.0000 mg | ORAL_TABLET | Freq: Two times a day (BID) | ORAL | Status: DC

## 2015-07-18 MED ORDER — LEVOTHYROXINE SODIUM 88 MCG PO TABS: 88.0000 ug | ORAL_TABLET | Freq: Every day | ORAL | Status: DC

## 2015-07-18 MED ORDER — ZOLPIDEM TARTRATE 10 MG PO TABS: ORAL_TABLET | ORAL | Status: DC

## 2015-07-18 NOTE — Progress Notes (Signed)
Subjective:  Patient ID: Kirsten [name], female    DOB: [birth year]  Age: 60 y.o. MRN: [id number]  CC: Anemia; Hypothyroidism; Hyperlipidemia; Diabetes; and Urinary Tract Infection   HPI Kirsten [name] Hyser presents for follow-up on the above medical problems.  She complains of itchy bumps throughout her body. She moved into a condo in the neighbors told her that there are chiggers in the area. She has red itchy bumps on her torso, legs, and upper extremities. She has not treated them in any way.  She tells me she recently saw her gynecologist and had a urinary tract infection. She has been treated with nitrofurantoin and tells me that she is better but still complains of dysuria and frequency.  She complains of fatigue and weight gain and is concerned that she may need a higher dose of levothyroxine. She feels like her blood sugars been well controlled and she denies polyuria and polydipsia but she does complain of polyphagia.  Outpatient Prescriptions Prior to Visit  Medication Sig Dispense Refill  . amiodarone (PACERONE) 200 MG tablet Take 200 mg by mouth daily.    Marland Kitchen apixaban (ELIQUIS) 5 MG TABS tablet Take 5 mg by mouth 2 (two) times daily.    Marland Kitchen glucose blood (ONE TOUCH ULTRA TEST) test strip Use as instructed 100 each 12  . hydrochlorothiazide (HYDRODIURIL) 25 MG tablet Take 1 tablet (25 mg total) by mouth daily. 90 tablet 3  . metoprolol succinate (TOPROL-XL) 50 MG 24 hr tablet   0  . mometasone-formoterol (DULERA) 200-5 MCG/ACT AERO Inhale 2 puffs into the lungs 2 (two) times daily. 13 g 11  . Multiple Vitamins-Minerals (MULTIVITAMIN,TX-MINERALS) tablet Take 1 tablet by mouth daily.      Marland Kitchen nystatin-triamcinolone (MYCOLOG II) cream Apply 1 application topically 2 (two) times daily. 30 g 0  . PARoxetine (PAXIL-CR) 25 MG 24 hr tablet take 1 tablet by mouth once daily 90 tablet 3  . ALPRAZolam (XANAX) 0.25 MG tablet take 1 tablet by mouth once daily if needed 90 tablet 1  .  levothyroxine (SYNTHROID) 75 MCG tablet Take 1 tablet (75 mcg total) by mouth daily before breakfast. 90 tablet 1  . zolpidem (AMBIEN) 10 MG tablet take 1 tablet by mouth at bedtime if needed 90 tablet 1  . metFORMIN (GLUCOPHAGE) 1000 MG tablet Take 1 tablet (1,000 mg total) by mouth 2 (two) times daily with a meal. 180 tablet 3  . nitrofurantoin, macrocrystal-monohydrate, (MACROBID) 100 MG capsule Take 1 capsule (100 mg total) by mouth 2 (two) times daily. 14 capsule 0  . nitrofurantoin, macrocrystal-monohydrate, (MACROBID) 100 MG capsule Take 1 capsule (100 mg total) by mouth 2 (two) times daily. 14 capsule 0   Facility-Administered Medications Prior to Visit  Medication Dose Route Frequency Provider Last Rate Last Dose  . cyanocobalamin ((VITAMIN B-12)) injection 1,000 mcg  1,000 mcg Intramuscular Once [name], MD        ROS Review of Systems  Constitutional: Positive for fatigue and unexpected weight change. Negative for fever, chills, diaphoresis and appetite change.  HENT: Negative.  Negative for sinus pressure, sore throat and trouble swallowing.   Eyes: Negative.  Negative for visual disturbance.  Respiratory: Negative.  Negative for cough, choking, chest tightness, shortness of breath and stridor.   Cardiovascular: Negative.  Negative for chest pain, palpitations and leg swelling.  Gastrointestinal: Negative.  Negative for nausea, vomiting, abdominal pain, diarrhea, constipation and blood in stool.  Endocrine: Positive for polyphagia. Negative for cold intolerance, heat  intolerance, polydipsia and polyuria.  Genitourinary: Positive for dysuria and frequency. Negative for urgency, hematuria, flank pain, decreased urine volume, enuresis, difficulty urinating and dyspareunia.  Musculoskeletal: Negative.  Negative for myalgias, back pain, arthralgias and neck pain.  Skin: Positive for rash. Negative for color change, pallor and wound.  Allergic/Immunologic: Negative.     Neurological: Negative.  Negative for dizziness, tremors, weakness, light-headedness, numbness and headaches.  Hematological: Negative.  Negative for adenopathy. Does not bruise/bleed easily.  Psychiatric/Behavioral: Positive for sleep disturbance. Negative for suicidal ideas, hallucinations, confusion, self-injury and dysphoric mood. The patient is nervous/anxious. The patient is not hyperactive.     Objective:  BP 110/70 mmHg  Pulse 73  Temp(Src) 98.4 F (36.9 C) (Oral)  Resp 16  Ht 5' 7"  (1.702 m)  Wt 218 lb (98.884 kg)  BMI 34.14 kg/m2  SpO2 94%  BP Readings from Last 3 Encounters:  07/18/15 110/70  02/28/15 124/72  01/13/15 121/78    Wt Readings from Last 3 Encounters:  07/18/15 218 lb (98.884 kg)  02/28/15 219 lb (99.338 kg)  01/13/15 217 lb 12.8 oz (98.793 kg)    Physical Exam  Constitutional: She is oriented to person, place, and time. She appears well-developed and well-nourished. No distress.  HENT:  Head: Normocephalic and atraumatic.  Mouth/Throat: Oropharynx is clear and moist. No oropharyngeal exudate.  Eyes: Conjunctivae are normal. Right eye exhibits no discharge. Left eye exhibits no discharge. No scleral icterus.  Neck: Normal range of motion. Neck supple. No JVD present. No tracheal deviation present. No thyromegaly present.  Cardiovascular: Normal rate, regular rhythm, normal heart sounds and intact distal pulses.  Exam reveals no gallop and no friction rub.   No murmur heard. Pulmonary/Chest: Effort normal and breath sounds normal. No stridor. No respiratory distress. She has no wheezes. She has no rales. She exhibits no tenderness.  Abdominal: Soft. Bowel sounds are normal. She exhibits no distension and no mass. There is no tenderness. There is no rebound and no guarding.  Musculoskeletal: Normal range of motion. She exhibits no edema or tenderness.  Lymphadenopathy:    She has no cervical adenopathy.  Neurological: She is oriented to person, place,  and time.  Skin: Skin is warm and dry. Rash noted. No bruising, no petechiae and no purpura noted. Rash is papular. Rash is not macular, not maculopapular, not nodular, not pustular, not vesicular and not urticarial. She is not diaphoretic. There is erythema.     There are irregularly scattered, excoriated, erythematous papules throughout her torso and upper and lower extremities.  Vitals reviewed.   Lab Results  Component Value Date   WBC 8.4 07/18/2015   HGB 14.1 07/18/2015   HCT 42.1 07/18/2015   PLT 318.0 07/18/2015   GLUCOSE 183* 07/18/2015   CHOL 201* 07/18/2015   TRIG 336.0* 07/18/2015   HDL 39.20 07/18/2015   LDLDIRECT 111.0 07/18/2015   LDLCALC SEE COMMENT 11/26/2013   ALT 22 07/18/2015   AST 17 07/18/2015   NA 140 07/18/2015   K 3.6 07/18/2015   CL 100 07/18/2015   CREATININE 0.77 07/18/2015   BUN 12 07/18/2015   CO2 30 07/18/2015   TSH 3.23 07/18/2015   INR 1.1 11/27/2013   HGBA1C 6.7* 07/18/2015   MICROALBUR <0.7 07/18/2015    Mm Add Views Bil Br Bccp Scr ([facility] Hx)  03/23/2014  CLINICAL DATA:  Screening. EXAM: DIGITAL SCREENING BILATERAL MAMMOGRAM WITH CAD COMPARISON:  Previous exam(s). ACR Breast Density Category a: The breast tissue is almost entirely fatty.  FINDINGS: There are no findings suspicious for malignancy. Images were processed with CAD. IMPRESSION: No mammographic evidence of malignancy. A result letter f this screening mammogram will be mailed directly to the patient. RECOMMENDATION: Screening mammogram in one year. (Code:SM-B-01Y) BI-RADS CATEGORY  1: Negative. Electronically Signed   By: Lovey Newcomer M.D.   On: 03/24/2014 07:54     Assessment & Plan:   Anaiah was seen today for anemia, hypothyroidism, hyperlipidemia, diabetes and urinary tract infection.  Diagnoses and all orders for this visit:  Essential hypertension, benign- her blood pressures adequately well-controlled, her electrolytes and renal function are stable. Will continue  hydrochlorothiazide and metoprolol. -     Comprehensive metabolic panel; Future -     CBC with Differential/Platelet; Future -     Urinalysis, Routine w reflex microscopic (not at [facility]); Future -     metFORMIN (GLUCOPHAGE) 1000 MG tablet; Take 1 tablet (1,000 mg total) by mouth 2 (two) times daily with a meal.  Other specified hypothyroidism- she is symptomatic and has a slightly elevated TSH, I think her goal for TSH is in the 1-2 range so I've made an increase in her levothyroxine dose. -     TSH; Future -     levothyroxine (SYNTHROID) 88 MCG tablet; Take 1 tablet (88 mcg total) by mouth daily before breakfast.  Hyperlipidemia with target LDL less than 100- her LDH is slightly elevated but at this time she is not willing to start a statin -     Comprehensive metabolic panel; Future -     Lipid panel; Future -     TSH; Future  Type 2 diabetes mellitus with complication, with long-term current use of insulin ([place])- her A1c is 6.7%, her blood sugars are adequately well-controlled. -     Comprehensive metabolic panel; Future -     Hemoglobin A1c; Future -     Microalbumin / creatinine urine ratio; Future  Hypertriglyceridemia- her triglycerides are modestly elevated, they are not at above 500 so at this time will not start a fish oil to treat this, she agrees to work on her lifestyle modifications with diet/exercise/weight loss. -     Lipid panel; Future  Vitamin B 12 deficiency -     CBC with Differential/Platelet; Future -     cyanocobalamin ((VITAMIN B-12)) injection 1,000 mcg; Inject 1 mL (1,000 mcg total) into the muscle once.  GAD (generalized anxiety disorder) -     ALPRAZolam (XANAX) 0.25 MG tablet; take 1 tablet by mouth once daily if needed  Depression with anxiety -     ALPRAZolam (XANAX) 0.25 MG tablet; take 1 tablet by mouth once daily if needed  Insomnia due to anxiety and fear -     zolpidem (AMBIEN) 10 MG tablet; take 1 tablet by mouth at bedtime if  needed  Paroxysmal atrial fibrillation ([place])- she has good rate and rhythm control  Chigger bites -     clobetasol ointment (TEMOVATE) 0.05 %; Apply 1 application topically 2 (two) times daily.  Recurrent UTI- her urinalysis is minimally abnormal, urine culture is pending, will treat if indicated. -     CULTURE, URINE COMPREHENSIVE; Future  Other orders -     Cancel: levothyroxine (SYNTHROID) 75 MCG tablet; Take 1 tablet (75 mcg total) by mouth daily before breakfast.   I have discontinued Ms. Huelsmann's levothyroxine, nitrofurantoin (macrocrystal-monohydrate), and nitrofurantoin (macrocrystal-monohydrate). I am also having her start on clobetasol ointment and levothyroxine. Additionally, I am having her maintain her (multivitamin,tx-minerals), glucose  blood, amiodarone, apixaban, metoprolol succinate, mometasone-formoterol, nystatin-triamcinolone, PARoxetine, hydrochlorothiazide, ALPRAZolam, zolpidem, and metFORMIN. We administered cyanocobalamin. We will continue to administer cyanocobalamin.  Meds ordered this encounter  Medications  . cyanocobalamin ((VITAMIN B-12)) injection 1,000 mcg    Sig:   . ALPRAZolam (XANAX) 0.25 MG tablet    Sig: take 1 tablet by mouth once daily if needed    Dispense:  90 tablet    Refill:  1  . zolpidem (AMBIEN) 10 MG tablet    Sig: take 1 tablet by mouth at bedtime if needed    Dispense:  90 tablet    Refill:  1  . metFORMIN (GLUCOPHAGE) 1000 MG tablet    Sig: Take 1 tablet (1,000 mg total) by mouth 2 (two) times daily with a meal.    Dispense:  180 tablet    Refill:  1  . clobetasol ointment (TEMOVATE) 0.05 %    Sig: Apply 1 application topically 2 (two) times daily.    Dispense:  60 g    Refill:  0  . levothyroxine (SYNTHROID) 88 MCG tablet    Sig: Take 1 tablet (88 mcg total) by mouth daily before breakfast.    Dispense:  90 tablet    Refill:  1     Follow-up: Return in about 4 months (around 11/17/2015).  [name], MD

## 2015-07-18 NOTE — Patient Instructions (Signed)

## 2015-07-19 ENCOUNTER — Encounter: Payer: Self-pay | Admitting: Internal Medicine

## 2015-07-19 ENCOUNTER — Telehealth: Payer: Self-pay | Admitting: *Deleted

## 2015-07-19 DIAGNOSIS — M7551 Bursitis of right shoulder: Secondary | ICD-10-CM | POA: Diagnosis not present

## 2015-07-19 DIAGNOSIS — G4733 Obstructive sleep apnea (adult) (pediatric): Secondary | ICD-10-CM | POA: Diagnosis not present

## 2015-07-19 NOTE — Telephone Encounter (Signed)
Pt aware  Per vm screening mammo ordered at [facility] in 01/2015. Per Roselyn Reef at [facility] pt only needs screening.

## 2015-07-19 NOTE — Telephone Encounter (Signed)
Patient called and stated that she is due for her mammogram . She called [facility] and they stated that she didn't have an order in the system. Can you see if Dr. Tennis Must can put in an order for her mammogram., Thanks

## 2015-07-20 ENCOUNTER — Encounter: Payer: Self-pay | Admitting: Internal Medicine

## 2015-07-21 ENCOUNTER — Encounter: Payer: Self-pay | Admitting: Internal Medicine

## 2015-07-21 ENCOUNTER — Other Ambulatory Visit: Payer: Self-pay | Admitting: Internal Medicine

## 2015-07-21 DIAGNOSIS — B952 Enterococcus as the cause of diseases classified elsewhere: Secondary | ICD-10-CM

## 2015-07-21 DIAGNOSIS — I4891 Unspecified atrial fibrillation: Secondary | ICD-10-CM | POA: Diagnosis not present

## 2015-07-21 DIAGNOSIS — N390 Urinary tract infection, site not specified: Principal | ICD-10-CM

## 2015-07-21 LAB — CULTURE, URINE COMPREHENSIVE: Colony Count: 70000

## 2015-07-21 MED ORDER — NITROFURANTOIN MONOHYD MACRO 100 MG PO CAPS
100.0000 mg | ORAL_CAPSULE | Freq: Two times a day (BID) | ORAL | Status: AC
Start: 2015-07-21 — End: 2015-07-28

## 2015-07-21 NOTE — Telephone Encounter (Signed)
Responded to pt via Telephone call due to three muchart msgs

## 2015-07-21 NOTE — Telephone Encounter (Signed)
Repsonded to pt via telephone call due to three mychart msgs. Results is preliminary at the moment

## 2015-08-18 DIAGNOSIS — G4733 Obstructive sleep apnea (adult) (pediatric): Secondary | ICD-10-CM | POA: Diagnosis not present

## 2015-08-25 ENCOUNTER — Ambulatory Visit
Admission: RE | Admit: 2015-08-25 | Discharge: 2015-08-25 | Disposition: A | Discharge status: Home / Self Care | Payer: PPO | Source: Ambulatory Visit | Attending: Obstetrics and Gynecology | Admitting: Obstetrics and Gynecology

## 2015-08-25 ENCOUNTER — Other Ambulatory Visit: Payer: Self-pay | Admitting: Obstetrics and Gynecology

## 2015-08-25 ENCOUNTER — Ambulatory Visit: Payer: PPO

## 2015-08-25 DIAGNOSIS — Z1231 Encounter for screening mammogram for malignant neoplasm of breast: Secondary | ICD-10-CM

## 2015-08-25 DIAGNOSIS — Z1239 Encounter for other screening for malignant neoplasm of breast: Secondary | ICD-10-CM

## 2015-09-06 DIAGNOSIS — I4891 Unspecified atrial fibrillation: Secondary | ICD-10-CM | POA: Diagnosis not present

## 2015-09-18 DIAGNOSIS — G4733 Obstructive sleep apnea (adult) (pediatric): Secondary | ICD-10-CM | POA: Diagnosis not present

## 2015-09-23 ENCOUNTER — Encounter: Payer: Self-pay | Admitting: Internal Medicine

## 2015-09-24 DIAGNOSIS — J0100 Acute maxillary sinusitis, unspecified: Secondary | ICD-10-CM | POA: Diagnosis not present

## 2015-10-04 DIAGNOSIS — G4733 Obstructive sleep apnea (adult) (pediatric): Secondary | ICD-10-CM | POA: Diagnosis not present

## 2015-10-07 ENCOUNTER — Ambulatory Visit (INDEPENDENT_AMBULATORY_CARE_PROVIDER_SITE_OTHER): Payer: PPO | Admitting: Family

## 2015-10-07 ENCOUNTER — Encounter: Payer: Self-pay | Admitting: Family

## 2015-10-07 ENCOUNTER — Ambulatory Visit (INDEPENDENT_AMBULATORY_CARE_PROVIDER_SITE_OTHER)
Admission: RE | Admit: 2015-10-07 | Discharge: 2015-10-07 | Disposition: A | Discharge status: Home / Self Care | Payer: PPO | Source: Ambulatory Visit | Attending: Family | Admitting: Family

## 2015-10-07 DIAGNOSIS — R059 Cough, unspecified: Secondary | ICD-10-CM

## 2015-10-07 DIAGNOSIS — R05 Cough: Secondary | ICD-10-CM

## 2015-10-07 DIAGNOSIS — R079 Chest pain, unspecified: Secondary | ICD-10-CM | POA: Diagnosis not present

## 2015-10-07 MED ORDER — ALBUTEROL SULFATE (2.5 MG/3ML) 0.083% IN NEBU: 2.5000 mg | INHALATION_SOLUTION | Freq: Four times a day (QID) | RESPIRATORY_TRACT | 1 refills | Status: DC | PRN

## 2015-10-07 MED ORDER — HYDROCOD POLST-CPM POLST ER 10-8 MG/5ML PO SUER: 5.0000 mL | Freq: Every evening | ORAL | 0 refills | Status: DC | PRN

## 2015-10-07 MED ORDER — METHYLPREDNISOLONE ACETATE 80 MG/ML IJ SUSP
80.0000 mg | Freq: Once | INTRAMUSCULAR | Status: AC
  Administered 2015-10-07: 80 mg via INTRAMUSCULAR

## 2015-10-07 MED ORDER — DOXYCYCLINE HYCLATE 100 MG PO TABS: 100.0000 mg | ORAL_TABLET | Freq: Two times a day (BID) | ORAL | 0 refills | Status: DC

## 2015-10-07 MED ORDER — ALBUTEROL SULFATE (2.5 MG/3ML) 0.083% IN NEBU
2.5000 mg | INHALATION_SOLUTION | Freq: Once | RESPIRATORY_TRACT | Status: AC
  Administered 2015-10-07: 2.5 mg via RESPIRATORY_TRACT

## 2015-10-07 NOTE — Assessment & Plan Note (Addendum)
Symptoms and exam consistent with acute sinusitis with concern for possible asthmatic bronchitis. In office albuterol treatment provided with mild improvements. In office injection of depomedrol provided. Start doxycycline. Start Tussionex as needed for cough and sleep. Continue over-the-counter medications as needed for symptom relief and supportive care. Follow-up if symptoms worsen or do not improve.

## 2015-10-07 NOTE — Progress Notes (Signed)
Subjective:    Patient ID: Kirsten [name], female    DOB: 02/07/1956, 60 y.o.   MRN: [id number]  Chief Complaint  Patient presents with  . Cough    x3 weeks, cough and congestion    HPI:  Kirsten [name] is a 60 y.o. female who  has a past medical history of A-fib ([place]); Anxiety disorder; Gastroschisis; GERD (gastroesophageal reflux disease); HTN (hypertension); Hyperlipidemia; IBS (irritable bowel syndrome); Morbid obesity ([place]); Obesity; Pulmonary fibrosis ([place]); SBO (small bowel obstruction) ([place]); Sleep apnea; and Type II or unspecified type diabetes mellitus without mention of complication, not stated as uncontrolled. and presents today for an acute office visit.  This is a new problem. Associated symptoms of cough, congestion, wheezing and shortness of breath have been going on for about 3 weeks. Describes feeling feverish at times. Modifying factors include a 5 day course of levofloxacin and Tessalon which resulted in some improvement however when the course was completed the symptoms came back. Severity of her symptoms effect her ability to sleep and have continued to gradually worsen.   Allergies  Allergen Reactions  . Factive [Gemifloxacin Mesylate] Rash  . Crestor [Rosuvastatin]     GI upset  . Penicillins     REACTION: rash  . Sulfonamide Derivatives     REACTION: rash  . Gemifloxacin Rash      Outpatient Medications Prior to Visit  Medication Sig Dispense Refill  . ALPRAZolam (XANAX) 0.25 MG tablet take 1 tablet by mouth once daily if needed 90 tablet 1  . amiodarone (PACERONE) 200 MG tablet Take 200 mg by mouth daily.    Marland Kitchen apixaban (ELIQUIS) 5 MG TABS tablet Take 5 mg by mouth 2 (two) times daily.    Marland Kitchen glucose blood (ONE TOUCH ULTRA TEST) test strip Use as instructed 100 each 12  . hydrochlorothiazide (HYDRODIURIL) 25 MG tablet Take 1 tablet (25 mg total) by mouth daily. 90 tablet 3  . levothyroxine (SYNTHROID) 88 MCG tablet Take 1 tablet (88 mcg total) by  mouth daily before breakfast. 90 tablet 1  . metFORMIN (GLUCOPHAGE) 1000 MG tablet Take 1 tablet (1,000 mg total) by mouth 2 (two) times daily with a meal. 180 tablet 1  . metoprolol succinate (TOPROL-XL) 50 MG 24 hr tablet   0  . mometasone-formoterol (DULERA) 200-5 MCG/ACT AERO Inhale 2 puffs into the lungs 2 (two) times daily. 13 g 11  . Multiple Vitamins-Minerals (MULTIVITAMIN,TX-MINERALS) tablet Take 1 tablet by mouth daily.      Marland Kitchen nystatin-triamcinolone (MYCOLOG II) cream Apply 1 application topically 2 (two) times daily. 30 g 0  . PARoxetine (PAXIL-CR) 25 MG 24 hr tablet take 1 tablet by mouth once daily 90 tablet 3  . zolpidem (AMBIEN) 10 MG tablet take 1 tablet by mouth at bedtime if needed 90 tablet 1  . clobetasol ointment (TEMOVATE) 5.27 % Apply 1 application topically 2 (two) times daily. 60 g 0   Facility-Administered Medications Prior to Visit  Medication Dose Route Frequency Provider Last Rate Last Dose  . cyanocobalamin ((VITAMIN B-12)) injection 1,000 mcg  1,000 mcg Intramuscular Once [name], MD          Past Surgical History:  Procedure Laterality Date  . BREAST BIOPSY Left   . CHOLECYSTECTOMY  1992  . Newborn Surgery - GI - ORGANS OUTSIDE ABDOMEN    . Small Bowel Repair    . TUBAL LIGATION  1988      Past Medical History:  Diagnosis Date  .  A-fib ([place])   . Anxiety disorder   . Gastroschisis    umphalocele, rotated organs abd repair until age 80  . GERD (gastroesophageal reflux disease)   . HTN (hypertension)   . Hyperlipidemia   . IBS (irritable bowel syndrome)   . Morbid obesity ([place])    Target wt - 185  for BMI < 30  . Obesity   . Pulmonary fibrosis ([place])   . SBO (small bowel obstruction) ([place])    Resolved with NG/Bowel rest around 2009  . Sleep apnea   . Type II or unspecified type diabetes mellitus without mention of complication, not stated as uncontrolled       Review of Systems  Constitutional: Positive for fever. Negative for  chills.  HENT: Positive for congestion and sinus pressure. Negative for sore throat.   Respiratory: Positive for cough, shortness of breath and wheezing.   Neurological: Positive for headaches.      Objective:    BP (!) 142/72 (BP Location: Left Arm, Patient Position: Sitting, Cuff Size: Normal)   Pulse 78   Temp 98 F (36.7 C) (Oral)   Resp 18   Ht 5' 7"  (1.702 m)   Wt 228 lb (103.4 kg)   SpO2 (!) 88%   BMI 35.71 kg/m  Nursing note and vital signs reviewed.  Physical Exam  Constitutional: She is oriented to person, place, and time. She appears well-developed and well-nourished. No distress.  HENT:  Right Ear: Hearing, tympanic membrane, external ear and ear canal normal.  Left Ear: Hearing, tympanic membrane, external ear and ear canal normal.  Nose: Right sinus exhibits maxillary sinus tenderness. Right sinus exhibits no frontal sinus tenderness. Left sinus exhibits maxillary sinus tenderness. Left sinus exhibits no frontal sinus tenderness.  Mouth/Throat: Uvula is midline, oropharynx is clear and moist and mucous membranes are normal.  Cardiovascular: Normal rate, regular rhythm, normal heart sounds and intact distal pulses.   Pulmonary/Chest: Effort normal and breath sounds normal.  Neurological: She is alert and oriented to person, place, and time.  Skin: Skin is warm and dry.  Psychiatric: She has a normal mood and affect. Her behavior is normal. Judgment and thought content normal.       Assessment & Plan:   Problem List Items Addressed This Visit      Other   Cough    Symptoms and exam consistent with acute sinusitis with concern for possible asthmatic bronchitis. In office albuterol treatment provided with mild improvements. In office injection of depomedrol provided. Start doxycycline. Start Tussionex as needed for cough and sleep. Continue over-the-counter medications as needed for symptom relief and supportive care. Follow-up if symptoms worsen or do not  improve.      Relevant Medications   doxycycline (VIBRA-TABS) 100 MG tablet   chlorpheniramine-HYDROcodone (TUSSIONEX PENNKINETIC ER) 10-8 MG/5ML SUER   albuterol (PROVENTIL) (2.5 MG/3ML) 0.083% nebulizer solution 2.5 mg (Completed)   methylPREDNISolone acetate (DEPO-MEDROL) injection 80 mg (Completed)   Other Relevant Orders   DG Chest 2 View (Completed)    Other Visit Diagnoses   None.      I have discontinued Ms. Staron's clobetasol ointment and albuterol. I am also having her start on doxycycline and chlorpheniramine-HYDROcodone. Additionally, I am having her maintain her (multivitamin,tx-minerals), glucose blood, amiodarone, apixaban, metoprolol succinate, mometasone-formoterol, nystatin-triamcinolone, PARoxetine, hydrochlorothiazide, ALPRAZolam, zolpidem, metFORMIN, and levothyroxine. We administered albuterol and methylPREDNISolone acetate. We will continue to administer cyanocobalamin.   Meds ordered this encounter  Medications  . doxycycline (VIBRA-TABS) 100 MG tablet  Sig: Take 1 tablet (100 mg total) by mouth 2 (two) times daily.    Dispense:  20 tablet    Refill:  0    Order Specific Question:   Supervising Provider    Answer:   Pricilla Holm A [[id number]]  . chlorpheniramine-HYDROcodone (TUSSIONEX PENNKINETIC ER) 10-8 MG/5ML SUER    Sig: Take 5 mLs by mouth at bedtime as needed.    Dispense:  115 mL    Refill:  0    Order Specific Question:   Supervising Provider    Answer:   Pricilla Holm A [[id number]]  . DISCONTD: albuterol (PROVENTIL) (2.5 MG/3ML) 0.083% nebulizer solution    Sig: Take 3 mLs (2.5 mg total) by nebulization every 6 (six) hours as needed for wheezing or shortness of breath.    Dispense:  150 mL    Refill:  1  . albuterol (PROVENTIL) (2.5 MG/3ML) 0.083% nebulizer solution 2.5 mg  . methylPREDNISolone acetate (DEPO-MEDROL) injection 80 mg     Follow-up: Return if symptoms worsen or fail to improve.  Mauricio Po, FNP

## 2015-10-07 NOTE — Patient Instructions (Signed)
Thank you for choosing [facility].  SUMMARY AND INSTRUCTIONS:  Medication:  Your prescription(s) have been submitted to your pharmacy or been printed and provided for you. Please take as directed and contact our office if you believe you are having problem(s) with the medication(s) or have any questions. ists we discussed.   Follow up:  If your symptoms worsen or fail to improve, please contact our office for further instruction, or in case of emergency go directly to the emergency room at the closest medical facility.    General Recommendations:    Please drink plenty of fluids.  Get plenty of rest   Sleep in humidified air  Use saline nasal sprays  Netti pot   OTC Medications:  Decongestants - helps relieve congestion   Flonase (generic fluticasone) or Nasacort (generic triamcinolone) - please make sure to use the "cross-over" technique at a 45 degree angle towards the opposite eye as opposed to straight up the nasal passageway.   Sudafed (generic pseudoephedrine - Note this is the one that is available behind the pharmacy counter); Products with phenylephrine (-PE) may also be used but is often not as effective as pseudoephedrine.   If you have HIGH BLOOD PRESSURE - Coricidin HBP; AVOID any product that is -D as this contains pseudoephedrine which may increase your blood pressure.  Afrin (oxymetazoline) every 6-8 hours for up to 3 days.   Allergies - helps relieve runny nose, itchy eyes and sneezing   Claritin (generic loratidine), Allegra (fexofenidine), or Zyrtec (generic cyrterizine) for runny nose. These medications should not cause drowsiness.  Note - Benadryl (generic diphenhydramine) may be used however may cause drowsiness  Cough -   Delsym or Robitussin (generic dextromethorphan)  Expectorants - helps loosen mucus to ease removal   Mucinex (generic guaifenesin) as directed on the package.  Headaches / General Aches   Tylenol (generic  acetaminophen) - DO NOT EXCEED 3 grams (3,000 mg) in a 24 hour time period  Advil/Motrin (generic ibuprofen)   Sore Throat -   Salt water gargle   Chloraseptic (generic benzocaine) spray or lozenges / Sucrets (generic dyclonine)

## 2015-10-17 ENCOUNTER — Encounter: Payer: Self-pay | Admitting: Pulmonary Disease

## 2015-10-19 DIAGNOSIS — G4733 Obstructive sleep apnea (adult) (pediatric): Secondary | ICD-10-CM | POA: Diagnosis not present

## 2015-10-21 ENCOUNTER — Ambulatory Visit: Payer: Self-pay

## 2015-10-21 ENCOUNTER — Ambulatory Visit (INDEPENDENT_AMBULATORY_CARE_PROVIDER_SITE_OTHER): Payer: PPO | Admitting: Podiatry

## 2015-10-21 ENCOUNTER — Encounter: Payer: Self-pay | Admitting: Podiatry

## 2015-10-21 VITALS — BP 134/71 | HR 68 | Resp 16

## 2015-10-21 DIAGNOSIS — M7752 Other enthesopathy of left foot: Secondary | ICD-10-CM | POA: Diagnosis not present

## 2015-10-21 DIAGNOSIS — M79672 Pain in left foot: Secondary | ICD-10-CM

## 2015-10-21 DIAGNOSIS — M779 Enthesopathy, unspecified: Secondary | ICD-10-CM

## 2015-10-21 DIAGNOSIS — M109 Gout, unspecified: Secondary | ICD-10-CM | POA: Diagnosis not present

## 2015-10-21 DIAGNOSIS — M778 Other enthesopathies, not elsewhere classified: Secondary | ICD-10-CM

## 2015-10-21 MED ORDER — METHYLPREDNISOLONE 4 MG PO TBPK: ORAL_TABLET | ORAL | 0 refills | Status: DC

## 2015-10-21 MED ORDER — BETAMETHASONE SOD PHOS & ACET 6 (3-3) MG/ML IJ SUSP: 12.0000 mg | Freq: Once | INTRAMUSCULAR | Status: DC

## 2015-10-21 NOTE — Patient Instructions (Signed)

## 2015-10-21 NOTE — Progress Notes (Signed)
Patient ID: Kirsten [name], female   DOB: 04-Sep-1955, 60 y.o.   MRN: [id number] Subjective: Patient presents today as a new patient for evaluation and treatment of pain to the left midfoot. Patient states that approximately a few weeks ago she woke up one morning with severe pain to the left midfoot. Patient states she does have hypertension with atrial fibrillation and she currently takes hydrochlorothiazide pill. 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   Objective: Physical Exam General: The patient is alert and oriented x3 in no acute distress.  Dermatology: Skin is warm, dry and supple bilateral lower extremities. Negative for open lesions or macerations.  Vascular: Palpable pedal pulses bilaterally. No edema or erythema noted. Capillary refill within normal limits.  Neurological: Epicritic and protective threshold grossly intact bilaterally.   Musculoskeletal Exam: Significant pain on palpation to the lateral aspect of the left midfoot. Range of motion within normal limits to all pedal and ankle joints bilateral. Muscle strength 5/5 in all groups bilateral.   Radiographic Exam:    Normal osseous mineralization. Joint spaces preserved. No fracture/dislocation/boney destruction.     Assessment: 1. Suspect gout left midfoot - lis franc joint 2. Pain left foot 3. Erythema and edema left foot-mild 4. Capsulitis left midfoot.  Problem List Items Addressed This Visit    None    Visit Diagnoses    Gout of left foot, unspecified cause, unspecified chronicity    -  Primary   Relevant Orders   DG Foot Complete Left   Uric acid   CBC with Differential/Platelet        Plan of Care:  #1 Patient was evaluated. #2 Injection of 0.5 mL Celestone Soluspan injected into the Lisfranc joint of the left foot. #3 prescription for Medrol Dosepak was prescribed for the patient. #4 suspect for acute gout arthritic attack. Prescription  for lab work was prescribed including CBC and uric acid panel. -Call patient to inform them of the lab results. #5 prescription for anti-inflammatory compounding cream given the patient through [place] #6 patient is to return to clinic in 4 weeks     Dr. Edrick Kins, [place]

## 2015-10-21 NOTE — Progress Notes (Signed)
   Subjective:    Patient ID: Kirsten [name], female    DOB: 1955/09/03, 60 y.o.   MRN: [id number]  HPI    Review of Systems  Musculoskeletal: Positive for gait problem.  All other systems reviewed and are negative.      Objective:   Physical Exam        Assessment & Plan:

## 2015-10-22 LAB — CBC WITH DIFFERENTIAL/PLATELET
BASOS: 0 %
Basophils Absolute: 0 10*3/uL (ref 0.0–0.2)
EOS (ABSOLUTE): 0.2 10*3/uL (ref 0.0–0.4)
EOS: 2 %
HEMATOCRIT: 40.7 % (ref 34.0–46.6)
HEMOGLOBIN: 13.7 g/dL (ref 11.1–15.9)
Immature Grans (Abs): 0 10*3/uL (ref 0.0–0.1)
Immature Granulocytes: 0 %
LYMPHS ABS: 3.5 10*3/uL — AB (ref 0.7–3.1)
Lymphs: 37 %
MCH: 30.7 pg (ref 26.6–33.0)
MCHC: 33.7 g/dL (ref 31.5–35.7)
MCV: 91 fL (ref 79–97)
MONOCYTES: 7 %
Monocytes Absolute: 0.7 10*3/uL (ref 0.1–0.9)
NEUTROS ABS: 5.2 10*3/uL (ref 1.4–7.0)
Neutrophils: 54 %
Platelets: 335 10*3/uL (ref 150–379)
RBC: 4.46 x10E6/uL (ref 3.77–5.28)
RDW: 14.5 % (ref 12.3–15.4)
WBC: 9.6 10*3/uL (ref 3.4–10.8)

## 2015-10-22 LAB — URIC ACID: URIC ACID: 7.3 mg/dL — AB (ref 2.5–7.1)

## 2015-10-24 ENCOUNTER — Telehealth: Payer: Self-pay | Admitting: Pulmonary Disease

## 2015-10-24 ENCOUNTER — Telehealth: Payer: Self-pay | Admitting: Podiatry

## 2015-10-24 NOTE — Telephone Encounter (Signed)
Pt calling for lab results.

## 2015-10-24 NOTE — Telephone Encounter (Signed)
Per Dr. Elsworth Soho CPAP compliance report shows good usage, no residuals on 8cm and no leakage.    ----------- Patient aware of results. Nothing further needed.

## 2015-10-25 ENCOUNTER — Telehealth: Payer: Self-pay

## 2015-10-25 NOTE — Telephone Encounter (Signed)
Pt called wanting to know her lab results Uric acid at 7.3

## 2015-10-31 ENCOUNTER — Encounter: Payer: Self-pay | Admitting: Internal Medicine

## 2015-10-31 DIAGNOSIS — M5010 Cervical disc disorder with radiculopathy, unspecified cervical region: Secondary | ICD-10-CM | POA: Diagnosis not present

## 2015-10-31 DIAGNOSIS — M9903 Segmental and somatic dysfunction of lumbar region: Secondary | ICD-10-CM | POA: Diagnosis not present

## 2015-10-31 DIAGNOSIS — M9901 Segmental and somatic dysfunction of cervical region: Secondary | ICD-10-CM | POA: Diagnosis not present

## 2015-10-31 DIAGNOSIS — R51 Headache: Secondary | ICD-10-CM | POA: Diagnosis not present

## 2015-11-02 ENCOUNTER — Ambulatory Visit (INDEPENDENT_AMBULATORY_CARE_PROVIDER_SITE_OTHER): Payer: PPO | Admitting: Pulmonary Disease

## 2015-11-02 ENCOUNTER — Other Ambulatory Visit: Payer: PPO

## 2015-11-02 ENCOUNTER — Encounter: Payer: Self-pay | Admitting: Pulmonary Disease

## 2015-11-02 VITALS — BP 110/70 | HR 70 | Ht 67.0 in | Wt 222.6 lb

## 2015-11-02 DIAGNOSIS — J4530 Mild persistent asthma, uncomplicated: Secondary | ICD-10-CM

## 2015-11-02 DIAGNOSIS — G4733 Obstructive sleep apnea (adult) (pediatric): Secondary | ICD-10-CM | POA: Diagnosis not present

## 2015-11-02 MED ORDER — IPRATROPIUM BROMIDE 0.03 % NA SOLN
2.0000 | Freq: Two times a day (BID) | NASAL | 12 refills | Status: DC
Start: 2015-11-02 — End: 2016-01-02

## 2015-11-02 NOTE — Progress Notes (Signed)
   Subjective:    Patient ID: Kirsten [name], female    DOB: 1955-11-19, 60 y.o.   MRN: [id number]  HPI  60 year old female  for management of obstructive sleep apnea.  Last seen =[facility] - 01/2013  PSG  2010- AHI of 80 events per hour. Maintained on auto CPAP   11/02/2015  Chief Complaint  Patient presents with  . Follow-up    Discuss CXR results, chronic runny nose; uses CPAP every night, no other concerns.     She had chest congestion which was treated with antibiotic Chest x-ray was obtained which showed left lower lobe platelike atelectasis-we reviewed this film today and answered her questions about this  She complains of nasal congestion all year round-she has tried Flonase nasal spray and Zyrtec without much relief  She is compliant with CPAP nasal mask denies any pressure or mask issues, no dryness. Has good improvement with her daytime somnolence and fatigue and wakes up feeling refreshed She has been unable to lose weight  DME- [facility]    Review of Systems neg for any significant sore throat, dysphagia, itching, sneezing, nasal congestion or excess/ purulent secretions, fever, chills, sweats, unintended wt loss, pleuritic or exertional cp, hempoptysis, orthopnea pnd or change in chronic leg swelling. Also denies presyncope, palpitations, heartburn, abdominal pain, nausea, vomiting, diarrhea or change in bowel or urinary habits, dysuria,hematuria, rash, arthralgias, visual complaints, headache, numbness weakness or ataxia.     Objective:   Physical Exam   Gen. Pleasant, obese, in no distress ENT - no lesions, no post nasal drip Neck: No JVD, no thyromegaly, no carotid bruits Lungs: no use of accessory muscles, no dullness to percussion, decreased without rales or rhonchi  Cardiovascular: Rhythm regular, heart sounds  normal, no murmurs or gallops, no peripheral edema Musculoskeletal: No deformities, no cyanosis or clubbing , no tremors          Assessment & Plan:

## 2015-11-02 NOTE — Assessment & Plan Note (Signed)
CPAP is effective and supplies will be renewed for a year  Weight loss encouraged, compliance with goal of at least 4-6 hrs every night is the expectation. Advised against medications with sedative side effects Cautioned against driving when sleepy - understanding that sleepiness will vary on a day to day basis

## 2015-11-02 NOTE — Assessment & Plan Note (Signed)
Blood work for allergy - [facility] , we will call you with results Trial of Atrovent nasal spray -each nare daily, treat as vasomotor rhinitis

## 2015-11-02 NOTE — Patient Instructions (Signed)
  Blood work for allergy - [facility] , we will call you with results Trial of Atrovent nasal spray -each nare daily  CPAP is effective and supplies will be renewed for a year

## 2015-11-03 DIAGNOSIS — M9901 Segmental and somatic dysfunction of cervical region: Secondary | ICD-10-CM | POA: Diagnosis not present

## 2015-11-03 DIAGNOSIS — M9903 Segmental and somatic dysfunction of lumbar region: Secondary | ICD-10-CM | POA: Diagnosis not present

## 2015-11-03 DIAGNOSIS — M5010 Cervical disc disorder with radiculopathy, unspecified cervical region: Secondary | ICD-10-CM | POA: Diagnosis not present

## 2015-11-03 DIAGNOSIS — R51 Headache: Secondary | ICD-10-CM | POA: Diagnosis not present

## 2015-11-03 LAB — RESPIRATORY ALLERGY PROFILE REGION II ~~LOC~~
Allergen, A. alternata, m6: <0.1 kU/L
Allergen, D pternoyssinus,d7: <0.1 kU/L
Allergen, Mulberry, t76: <0.1 kU/L
Allergen, P. notatum, m1: <0.1 kU/L
Aspergillus fumigatus, m3: <0.1 kU/L
Cockroach: <0.1 kU/L
Common Ragweed: <0.1 kU/L
D. farinae: <0.1 kU/L
Elm IgE: <0.1 kU/L
Johnson Grass: <0.1 kU/L
Pecan/Hickory Tree IgE: <0.1 kU/L
Rough Pigweed  IgE: <0.1 kU/L
Timothy Grass: <0.1 kU/L

## 2015-11-08 ENCOUNTER — Encounter: Payer: Self-pay | Admitting: Podiatry

## 2015-11-08 ENCOUNTER — Ambulatory Visit (INDEPENDENT_AMBULATORY_CARE_PROVIDER_SITE_OTHER): Payer: PPO | Admitting: Podiatry

## 2015-11-08 ENCOUNTER — Telehealth: Payer: Self-pay | Admitting: Pulmonary Disease

## 2015-11-08 DIAGNOSIS — M7752 Other enthesopathy of left foot: Secondary | ICD-10-CM

## 2015-11-08 DIAGNOSIS — L820 Inflamed seborrheic keratosis: Secondary | ICD-10-CM | POA: Diagnosis not present

## 2015-11-08 DIAGNOSIS — D485 Neoplasm of uncertain behavior of skin: Secondary | ICD-10-CM | POA: Diagnosis not present

## 2015-11-08 DIAGNOSIS — M779 Enthesopathy, unspecified: Principal | ICD-10-CM

## 2015-11-08 DIAGNOSIS — D239 Other benign neoplasm of skin, unspecified: Secondary | ICD-10-CM | POA: Diagnosis not present

## 2015-11-08 DIAGNOSIS — D1800 Hemangioma unspecified site: Secondary | ICD-10-CM | POA: Diagnosis not present

## 2015-11-08 DIAGNOSIS — M778 Other enthesopathies, not elsewhere classified: Secondary | ICD-10-CM

## 2015-11-08 DIAGNOSIS — B078 Other viral warts: Secondary | ICD-10-CM | POA: Diagnosis not present

## 2015-11-08 DIAGNOSIS — L814 Other melanin hyperpigmentation: Secondary | ICD-10-CM | POA: Diagnosis not present

## 2015-11-08 DIAGNOSIS — L578 Other skin changes due to chronic exposure to nonionizing radiation: Secondary | ICD-10-CM | POA: Diagnosis not present

## 2015-11-08 DIAGNOSIS — B360 Pityriasis versicolor: Secondary | ICD-10-CM | POA: Diagnosis not present

## 2015-11-08 DIAGNOSIS — Z85828 Personal history of other malignant neoplasm of skin: Secondary | ICD-10-CM | POA: Diagnosis not present

## 2015-11-08 DIAGNOSIS — L821 Other seborrheic keratosis: Secondary | ICD-10-CM | POA: Diagnosis not present

## 2015-11-08 DIAGNOSIS — Z1283 Encounter for screening for malignant neoplasm of skin: Secondary | ICD-10-CM | POA: Diagnosis not present

## 2015-11-08 NOTE — Progress Notes (Signed)
Subjective: Patient presents today for follow-up evaluation of left midfoot pain. Patient states that the injection which she received did not help. She also states that the Medrol Dosepak and pain cream through [place] did not help. Same is intermittent off and on and the same without improvement. Patient presents today for further treatment and evaluation   Objective: Physical Exam General: The patient is alert and oriented x3 in no acute distress.  Dermatology: Skin is warm, dry and supple bilateral lower extremities. Negative for open lesions or macerations.  Vascular: Palpable pedal pulses bilaterally. No edema or erythema noted. Capillary refill within normal limits.  Neurological: Epicritic and protective threshold grossly intact bilaterally.   Musculoskeletal Exam: Pain on palpation to the dorsum of the left midfoot.  Range of motion within normal limits to all pedal and ankle joints bilateral. Muscle strength 5/5 in all groups bilateral.    Assessment: #1 suspect possible gout attack. #2 possible left foot capsulitis. #3 possible left midfoot tendinitis. #4 pain in left foot  Problem List Items Addressed This Visit    None    Visit Diagnoses   None.     Plan of Care:  #1 Patient was evaluated. #2 Today MRI of the left foot was recommended. Patient refused. Patient is going to see how the progression of the left midfoot pain is over the next 4 weeks. #3 the patient still experiences pain and wants the MRI she is to call the office in we will order an MRI and follow-up appointment.     Dr. Edrick Kins, [place]

## 2015-11-08 NOTE — Telephone Encounter (Signed)
Spoke with pt and gave lab results. Pt would like results released to MyChart.  RA - Please release Allergy Panel results to MyChart for pt to view. Thanks!

## 2015-11-10 DIAGNOSIS — M5010 Cervical disc disorder with radiculopathy, unspecified cervical region: Secondary | ICD-10-CM | POA: Diagnosis not present

## 2015-11-10 DIAGNOSIS — M9901 Segmental and somatic dysfunction of cervical region: Secondary | ICD-10-CM | POA: Diagnosis not present

## 2015-11-10 DIAGNOSIS — M9903 Segmental and somatic dysfunction of lumbar region: Secondary | ICD-10-CM | POA: Diagnosis not present

## 2015-11-10 DIAGNOSIS — R51 Headache: Secondary | ICD-10-CM | POA: Diagnosis not present

## 2015-11-10 NOTE — Telephone Encounter (Signed)
RA please advise

## 2015-11-11 ENCOUNTER — Ambulatory Visit: Payer: PPO | Admitting: Podiatry

## 2015-11-14 ENCOUNTER — Ambulatory Visit: Payer: PPO | Admitting: Internal Medicine

## 2015-11-14 NOTE — Telephone Encounter (Signed)
Orders have been released to my chart per pts request. Nothing further is needed.

## 2015-11-16 ENCOUNTER — Encounter: Payer: Self-pay | Admitting: Pulmonary Disease

## 2015-11-17 ENCOUNTER — Ambulatory Visit: Payer: PPO | Admitting: Internal Medicine

## 2015-11-17 DIAGNOSIS — M9903 Segmental and somatic dysfunction of lumbar region: Secondary | ICD-10-CM | POA: Diagnosis not present

## 2015-11-17 DIAGNOSIS — R51 Headache: Secondary | ICD-10-CM | POA: Diagnosis not present

## 2015-11-17 DIAGNOSIS — M9901 Segmental and somatic dysfunction of cervical region: Secondary | ICD-10-CM | POA: Diagnosis not present

## 2015-11-17 DIAGNOSIS — M5010 Cervical disc disorder with radiculopathy, unspecified cervical region: Secondary | ICD-10-CM | POA: Diagnosis not present

## 2015-11-18 DIAGNOSIS — G4733 Obstructive sleep apnea (adult) (pediatric): Secondary | ICD-10-CM | POA: Diagnosis not present

## 2015-11-28 ENCOUNTER — Encounter: Payer: Self-pay | Admitting: Internal Medicine

## 2015-11-28 ENCOUNTER — Other Ambulatory Visit: Payer: PPO

## 2015-11-28 ENCOUNTER — Other Ambulatory Visit (INDEPENDENT_AMBULATORY_CARE_PROVIDER_SITE_OTHER): Payer: PPO

## 2015-11-28 ENCOUNTER — Ambulatory Visit (INDEPENDENT_AMBULATORY_CARE_PROVIDER_SITE_OTHER): Payer: PPO | Admitting: Internal Medicine

## 2015-11-28 VITALS — BP 118/82 | HR 70 | Temp 98.6°F | Resp 16 | Ht 67.0 in | Wt 224.0 lb

## 2015-11-28 DIAGNOSIS — E538 Deficiency of other specified B group vitamins: Secondary | ICD-10-CM | POA: Diagnosis not present

## 2015-11-28 DIAGNOSIS — N390 Urinary tract infection, site not specified: Principal | ICD-10-CM

## 2015-11-28 DIAGNOSIS — E038 Other specified hypothyroidism: Secondary | ICD-10-CM

## 2015-11-28 DIAGNOSIS — B952 Enterococcus as the cause of diseases classified elsewhere: Secondary | ICD-10-CM

## 2015-11-28 DIAGNOSIS — I10 Essential (primary) hypertension: Secondary | ICD-10-CM

## 2015-11-28 DIAGNOSIS — E118 Type 2 diabetes mellitus with unspecified complications: Secondary | ICD-10-CM | POA: Diagnosis not present

## 2015-11-28 DIAGNOSIS — Z23 Encounter for immunization: Secondary | ICD-10-CM | POA: Diagnosis not present

## 2015-11-28 LAB — CBC WITH DIFFERENTIAL/PLATELET
BASOS ABS: 0.1 10*3/uL (ref 0.0–0.1)
Basophils Relative: 0.9 % (ref 0.0–3.0)
Eosinophils Absolute: 0.1 10*3/uL (ref 0.0–0.7)
Eosinophils Relative: 1.4 % (ref 0.0–5.0)
HCT: 42.1 % (ref 36.0–46.0)
Hemoglobin: 14.2 g/dL (ref 12.0–15.0)
LYMPHS ABS: 3.1 10*3/uL (ref 0.7–4.0)
Lymphocytes Relative: 28.5 % (ref 12.0–46.0)
MCHC: 33.8 g/dL (ref 30.0–36.0)
MCV: 89.2 fl (ref 78.0–100.0)
MONO ABS: 0.6 10*3/uL (ref 0.1–1.0)
Monocytes Relative: 5.7 % (ref 3.0–12.0)
NEUTROS ABS: 6.8 10*3/uL (ref 1.4–7.7)
NEUTROS PCT: 63.5 % (ref 43.0–77.0)
PLATELETS: 305 10*3/uL (ref 150.0–400.0)
RBC: 4.72 Mil/uL (ref 3.87–5.11)
RDW: 14.3 % (ref 11.5–15.5)
WBC: 10.8 10*3/uL — ABNORMAL HIGH (ref 4.0–10.5)

## 2015-11-28 LAB — POC URINALSYSI DIPSTICK (AUTOMATED)
BILIRUBIN UA: NEGATIVE
Glucose, UA: NEGATIVE
KETONES UA: NEGATIVE
NITRITE UA: NEGATIVE
PH UA: 6
Protein, UA: NEGATIVE
RBC UA: POSITIVE
SPEC GRAV UA: 1.025
Urobilinogen, UA: 0.2

## 2015-11-28 LAB — BASIC METABOLIC PANEL
BUN: 12 mg/dL (ref 6–23)
CALCIUM: 9.9 mg/dL (ref 8.4–10.5)
CO2: 33 mEq/L — ABNORMAL HIGH (ref 19–32)
CREATININE: 0.71 mg/dL (ref 0.40–1.20)
Chloride: 101 mEq/L (ref 96–112)
GFR: 89.76 mL/min (ref >60.00)
Glucose, Bld: 97 mg/dL (ref 70–99)
Potassium: 3.7 mEq/L (ref 3.5–5.1)
Sodium: 143 mEq/L (ref 135–145)

## 2015-11-28 LAB — TSH: TSH: 2.44 u[IU]/mL (ref 0.35–4.50)

## 2015-11-28 LAB — HEMOGLOBIN A1C: HEMOGLOBIN A1C: 6.5 % (ref 4.6–6.5)

## 2015-11-28 MED ORDER — CYANOCOBALAMIN 1000 MCG/ML IJ SOLN
1000.0000 ug | Freq: Once | INTRAMUSCULAR | Status: AC
  Administered 2015-11-28: 1000 ug via INTRAMUSCULAR

## 2015-11-28 MED ORDER — NITROFURANTOIN MONOHYD MACRO 100 MG PO CAPS: 100.0000 mg | ORAL_CAPSULE | Freq: Two times a day (BID) | ORAL | 1 refills | Status: DC

## 2015-11-28 NOTE — Patient Instructions (Signed)

## 2015-11-28 NOTE — Progress Notes (Signed)
Subjective:  Patient ID: Kirsten [name], female    DOB: [birth year]  Age: 60 y.o. MRN: [id number]  CC: Hypertension; Diabetes; and Urinary Tract Infection   HPI Kirsten [name] presents for a 3 day history of dysuria, urgency, and bladder pain.  Outpatient Medications Prior to Visit  Medication Sig Dispense Refill  . ALPRAZolam (XANAX) 0.25 MG tablet take 1 tablet by mouth once daily if needed 90 tablet 1  . amiodarone (PACERONE) 200 MG tablet Take 200 mg by mouth daily.    Marland Kitchen apixaban (ELIQUIS) 5 MG TABS tablet Take 5 mg by mouth 2 (two) times daily.    Marland Kitchen glucose blood (ONE TOUCH ULTRA TEST) test strip Use as instructed 100 each 12  . hydrochlorothiazide (HYDRODIURIL) 25 MG tablet Take 1 tablet (25 mg total) by mouth daily. 90 tablet 3  . ipratropium (ATROVENT) 0.03 % nasal spray Place 2 sprays into both nostrils every 12 (twelve) hours. 30 mL 12  . levothyroxine (SYNTHROID) 88 MCG tablet Take 1 tablet (88 mcg total) by mouth daily before breakfast. 90 tablet 1  . metFORMIN (GLUCOPHAGE) 1000 MG tablet Take 1 tablet (1,000 mg total) by mouth 2 (two) times daily with a meal. 180 tablet 1  . metoprolol succinate (TOPROL-XL) 50 MG 24 hr tablet   0  . PARoxetine (PAXIL-CR) 25 MG 24 hr tablet take 1 tablet by mouth once daily 90 tablet 3  . zolpidem (AMBIEN) 10 MG tablet take 1 tablet by mouth at bedtime if needed 90 tablet 1  . Multiple Vitamins-Minerals (MULTIVITAMIN,TX-MINERALS) tablet Take 1 tablet by mouth daily.       Facility-Administered Medications Prior to Visit  Medication Dose Route Frequency Provider Last Rate Last Dose  . cyanocobalamin ((VITAMIN B-12)) injection 1,000 mcg  1,000 mcg Intramuscular Once [name], MD      . betamethasone acetate-betamethasone sodium phosphate (CELESTONE) injection 12 mg  12 mg Intramuscular Once Edrick Kins, DPM        ROS Review of Systems  Constitutional: Negative.  Negative for appetite change, chills, diaphoresis, fatigue and  fever.  HENT: Negative.  Negative for trouble swallowing.   Eyes: Negative for visual disturbance.  Respiratory: Negative for cough, choking, chest tightness, shortness of breath and stridor.   Cardiovascular: Negative for chest pain, palpitations and leg swelling.  Gastrointestinal: Negative for abdominal pain, constipation, diarrhea, nausea and vomiting.  Endocrine: Negative.  Negative for polydipsia, polyphagia and polyuria.  Genitourinary: Positive for dysuria and urgency. Negative for decreased urine volume, difficulty urinating, dyspareunia, flank pain, frequency, hematuria, pelvic pain and vaginal pain.  Musculoskeletal: Negative.  Negative for arthralgias, back pain, joint swelling, myalgias and neck pain.  Skin: Negative.  Negative for color change and rash.  Allergic/Immunologic: Negative.   Neurological: Negative.  Negative for dizziness, tremors, weakness, light-headedness, numbness and headaches.  Hematological: Negative.  Negative for adenopathy. Does not bruise/bleed easily.  Psychiatric/Behavioral: Negative.     Objective:  BP 118/82 (BP Location: Left Arm, Patient Position: Sitting, Cuff Size: Large)   Pulse 70   Temp 98.6 F (37 C) (Oral)   Resp 16   Ht 5' 7"  (1.702 m)   Wt 224 lb (101.6 kg)   SpO2 93%   BMI 35.08 kg/m   BP Readings from Last 3 Encounters:  11/28/15 118/82  11/02/15 110/70  10/21/15 134/71    Wt Readings from Last 3 Encounters:  11/28/15 224 lb (101.6 kg)  11/02/15 222 lb 9.6 oz (101 kg)  10/07/15  228 lb (103.4 kg)    Physical Exam  Constitutional: She is oriented to person, place, and time. No distress.  HENT:  Mouth/Throat: Oropharynx is clear and moist. No oropharyngeal exudate.  Eyes: Conjunctivae are normal. Right eye exhibits no discharge. Left eye exhibits no discharge. No scleral icterus.  Neck: Normal range of motion. Neck supple. No JVD present. No tracheal deviation present. No thyromegaly present.  Cardiovascular: Normal  rate, regular rhythm, normal heart sounds and intact distal pulses.  Exam reveals no gallop and no friction rub.   No murmur heard. Pulmonary/Chest: Effort normal and breath sounds normal. No stridor. No respiratory distress. She has no wheezes. She has no rales. She exhibits no tenderness.  Abdominal: Soft. Bowel sounds are normal. She exhibits no distension and no mass. There is no tenderness. There is no rebound and no guarding.  Musculoskeletal: Normal range of motion. She exhibits no edema, tenderness or deformity.  Lymphadenopathy:    She has no cervical adenopathy.  Neurological: She is oriented to person, place, and time.  Skin: Skin is warm and dry. No rash noted. She is not diaphoretic. No erythema. No pallor.  Psychiatric: She has a normal mood and affect. Her behavior is normal. Judgment and thought content normal.  Vitals reviewed.   Lab Results  Component Value Date   WBC 10.8 (H) 11/28/2015   HGB 14.2 11/28/2015   HCT 42.1 11/28/2015   PLT 305.0 11/28/2015   GLUCOSE 97 11/28/2015   CHOL 201 (H) 07/18/2015   TRIG 336.0 (H) 07/18/2015   HDL 39.20 07/18/2015   LDLDIRECT 111.0 07/18/2015   LDLCALC SEE COMMENT 11/26/2013   ALT 22 07/18/2015   AST 17 07/18/2015   NA 143 11/28/2015   K 3.7 11/28/2015   CL 101 11/28/2015   CREATININE 0.71 11/28/2015   BUN 12 11/28/2015   CO2 33 (H) 11/28/2015   TSH 2.44 11/28/2015   INR 1.1 11/27/2013   HGBA1C 6.5 11/28/2015   MICROALBUR <0.7 07/18/2015    Dg Chest 2 View  Result Date: 10/07/2015 CLINICAL DATA:  Cough and chest congestion for 3 weeks. Low-grade fever. Hypoxia. Chest pain. Atrial fibrillation. EXAM: CHEST  2 VIEW COMPARISON:  05/28/2011 FINDINGS: The heart size and mediastinal contours are within normal limits. New linear opacity seen in left lung base, which may be due to atelectasis or scarring. No evidence of pulmonary consolidation or edema. No evidence of pneumothorax or pleural effusion. IMPRESSION: Left basilar  atelectasis versus scarring. Electronically Signed   By: Earle Gell M.D.   On: 10/07/2015 16:45   Culture ENTEROBACTER AEROGENES   Colony Count Greater than 100,000 CFU/mL   Organism ID, Bacteria ENTEROBACTER AEROGENES   Resulting Agency [facility]  Susceptibility    Enterobacter aerogenes    Not Specified    AMOX/CLAVULANIC >=32  Resistant    CEFAZOLIN >=64  Resistant    CEFEPIME <=1 "><=1  Sensitive    CEFTAZIDIME <=1 "><=1  Sensitive    CEFTRIAXONE <=1 "><=1  Sensitive    CIPROFLOXACIN <=0.25 "><=0.25  Sensitive    GENTAMICIN <=1 "><=1  Sensitive    LEVOFLOXACIN <=0.12 "><=0.12  Sensitive    NITROFURANTOIN 64  Intermediate    PIP/TAZO <=4 "><=4  Sensitive    TOBRAMYCIN <=1 "><=1  Sensitive    TRIMETH/SULFA <=20 "><=20  Sensitive 1         Assessment & Plan:   Wannetta was seen today for hypertension, diabetes and urinary tract infection.  Diagnoses and all orders for  this visit:  Essential hypertension, benign- Her blood pressure is well-controlled, electrolytes and renal function are stable. -     Basic metabolic panel; Future -     CBC with Differential/Platelet; Future  Other specified hypothyroidism- her TSH is in the normal range, she will remain on the current dose of levothyroxine -     TSH; Future  Type 2 diabetes mellitus with complication, without long-term current use of insulin ([place])- her A1c is 6.5%, her blood sugars erratically well-controlled. -     Basic metabolic panel; Future -     Hemoglobin A1c; Future  Enterococcus UTI- her urine culture is positive for Enterobacter, the sensitivity to nitrofurantoin is intermediate, she tells me that she is improving on nitrofurantoin so I will continue that for now. Will change antibiotics if her symptoms don't resolve soon. -     nitrofurantoin, macrocrystal-monohydrate, (MACROBID) 100 MG capsule; Take 1 capsule (100 mg total) by mouth 2 (two) times daily. -     POCT Urinalysis Dipstick (Automated) -      CULTURE, URINE COMPREHENSIVE; Future  Need for prophylactic vaccination and inoculation against influenza -     Flu Vaccine QUAD 36+ mos IM  B12 deficiency -     cyanocobalamin ((VITAMIN B-12)) injection 1,000 mcg; Inject 1 mL (1,000 mcg total) into the muscle once.   I have discontinued Ms. Littlefield's (multivitamin,tx-minerals). I am also having her start on nitrofurantoin (macrocrystal-monohydrate). Additionally, I am having her maintain her glucose blood, amiodarone, apixaban, metoprolol succinate, PARoxetine, hydrochlorothiazide, ALPRAZolam, zolpidem, metFORMIN, levothyroxine, and ipratropium. We will stop administering betamethasone acetate-betamethasone sodium phosphate. Additionally, we administered cyanocobalamin. Additionally, we will continue to administer cyanocobalamin.  Meds ordered this encounter  Medications  . nitrofurantoin, macrocrystal-monohydrate, (MACROBID) 100 MG capsule    Sig: Take 1 capsule (100 mg total) by mouth 2 (two) times daily.    Dispense:  14 capsule    Refill:  1  . cyanocobalamin ((VITAMIN B-12)) injection 1,000 mcg     Follow-up: Return in about 4 weeks (around 12/26/2015).  [name], MD

## 2015-11-28 NOTE — Progress Notes (Signed)
Pre visit review using our clinic review tool, if applicable. No additional management support is needed unless otherwise documented below in the visit note. 

## 2015-11-29 ENCOUNTER — Encounter: Payer: Self-pay | Admitting: Internal Medicine

## 2015-11-29 DIAGNOSIS — I4891 Unspecified atrial fibrillation: Secondary | ICD-10-CM | POA: Diagnosis not present

## 2015-11-29 DIAGNOSIS — R0602 Shortness of breath: Secondary | ICD-10-CM | POA: Diagnosis not present

## 2015-11-29 DIAGNOSIS — R002 Palpitations: Secondary | ICD-10-CM | POA: Diagnosis not present

## 2015-12-01 ENCOUNTER — Encounter: Payer: Self-pay | Admitting: Internal Medicine

## 2015-12-01 LAB — CULTURE, URINE COMPREHENSIVE

## 2015-12-05 ENCOUNTER — Other Ambulatory Visit: Payer: Self-pay | Admitting: Internal Medicine

## 2015-12-05 DIAGNOSIS — N390 Urinary tract infection, site not specified: Principal | ICD-10-CM

## 2015-12-05 DIAGNOSIS — B952 Enterococcus as the cause of diseases classified elsewhere: Secondary | ICD-10-CM

## 2015-12-05 MED ORDER — CIPROFLOXACIN HCL 500 MG PO TABS: 500.0000 mg | ORAL_TABLET | Freq: Two times a day (BID) | ORAL | 1 refills | Status: AC

## 2015-12-05 MED ORDER — CIPROFLOXACIN HCL 500 MG PO TABS: 500.0000 mg | ORAL_TABLET | Freq: Two times a day (BID) | ORAL | 1 refills | Status: DC

## 2015-12-19 DIAGNOSIS — G4733 Obstructive sleep apnea (adult) (pediatric): Secondary | ICD-10-CM | POA: Diagnosis not present

## 2015-12-26 DIAGNOSIS — R05 Cough: Secondary | ICD-10-CM | POA: Diagnosis not present

## 2015-12-26 DIAGNOSIS — J209 Acute bronchitis, unspecified: Secondary | ICD-10-CM | POA: Diagnosis not present

## 2016-01-02 ENCOUNTER — Ambulatory Visit (INDEPENDENT_AMBULATORY_CARE_PROVIDER_SITE_OTHER)
Admission: RE | Admit: 2016-01-02 | Discharge: 2016-01-02 | Disposition: A | Discharge status: Home / Self Care | Payer: PPO | Source: Ambulatory Visit | Attending: Nurse Practitioner | Admitting: Nurse Practitioner

## 2016-01-02 ENCOUNTER — Ambulatory Visit (INDEPENDENT_AMBULATORY_CARE_PROVIDER_SITE_OTHER): Payer: PPO | Admitting: Nurse Practitioner

## 2016-01-02 ENCOUNTER — Encounter: Payer: Self-pay | Admitting: Nurse Practitioner

## 2016-01-02 VITALS — BP 130/82 | HR 78 | Temp 98.4°F | Resp 16 | Wt 222.0 lb

## 2016-01-02 DIAGNOSIS — R0989 Other specified symptoms and signs involving the circulatory and respiratory systems: Secondary | ICD-10-CM

## 2016-01-02 DIAGNOSIS — R918 Other nonspecific abnormal finding of lung field: Secondary | ICD-10-CM | POA: Diagnosis not present

## 2016-01-02 DIAGNOSIS — J4531 Mild persistent asthma with (acute) exacerbation: Secondary | ICD-10-CM

## 2016-01-02 DIAGNOSIS — R05 Cough: Secondary | ICD-10-CM | POA: Diagnosis not present

## 2016-01-02 DIAGNOSIS — R0602 Shortness of breath: Secondary | ICD-10-CM | POA: Diagnosis not present

## 2016-01-02 DIAGNOSIS — R059 Cough, unspecified: Secondary | ICD-10-CM

## 2016-01-02 MED ORDER — IPRATROPIUM BROMIDE 0.03 % NA SOLN: 2.0000 | Freq: Two times a day (BID) | NASAL | 12 refills | Status: DC

## 2016-01-02 MED ORDER — DM-GUAIFENESIN ER 30-600 MG PO TB12: 1.0000 | ORAL_TABLET | Freq: Two times a day (BID) | ORAL | 0 refills | Status: DC | PRN

## 2016-01-02 MED ORDER — IPRATROPIUM-ALBUTEROL 0.5-2.5 (3) MG/3ML IN SOLN
3.0000 mL | Freq: Four times a day (QID) | RESPIRATORY_TRACT | Status: DC
  Administered 2016-01-02: 3 mL via RESPIRATORY_TRACT

## 2016-01-02 MED ORDER — BENZONATATE 100 MG PO CAPS: 100.0000 mg | ORAL_CAPSULE | Freq: Three times a day (TID) | ORAL | 0 refills | Status: DC | PRN

## 2016-01-02 MED ORDER — OMEPRAZOLE 20 MG PO CPDR: 20.0000 mg | DELAYED_RELEASE_CAPSULE | Freq: Every day | ORAL | 3 refills | Status: DC

## 2016-01-02 MED ORDER — ALBUTEROL SULFATE HFA 108 (90 BASE) MCG/ACT IN AERS: 2.0000 | INHALATION_SPRAY | Freq: Four times a day (QID) | RESPIRATORY_TRACT | 0 refills | Status: DC | PRN

## 2016-01-02 MED ORDER — CETIRIZINE HCL 10 MG PO TABS: 10.0000 mg | ORAL_TABLET | Freq: Every day | ORAL | 0 refills | Status: DC

## 2016-01-02 MED ORDER — PROMETHAZINE-DM 6.25-15 MG/5ML PO SYRP: 5.0000 mL | ORAL_SOLUTION | Freq: Three times a day (TID) | ORAL | 0 refills | Status: DC | PRN

## 2016-01-02 NOTE — Progress Notes (Addendum)
Subjective:  Patient ID: Kirsten [name], female    DOB: 1955/09/26  Age: 60 y.o. MRN: [id number]  CC: Cough (x2weeks) and Nasal Congestion (x2weeks)   Cough  This is a recurrent problem. The current episode started 1 to 4 weeks ago. The problem has been waxing and waning. The problem occurs constantly. The cough is productive of sputum. Associated symptoms include chest pain, myalgias, nasal congestion, postnasal drip, rhinorrhea, shortness of breath and wheezing. Pertinent negatives include no chills, ear congestion, ear pain, fever, headaches, heartburn, hemoptysis, rash, sore throat, sweats or weight loss. The symptoms are aggravated by lying down. Kirsten [name] has tried oral steroids and OTC cough suppressant (and levaquin 545m x 7days) for the symptoms. The treatment provided no relief. Her past medical history is significant for asthma, bronchitis and environmental allergies.  ongoing intermittent cough since 09/2015: treated with oral prednisone twice and oral abx twice (doxycycline and levaquin).  Outpatient Medications Prior to Visit  Medication Sig Dispense Refill  . ALPRAZolam (XANAX) 0.25 MG tablet take 1 tablet by mouth once daily if needed 90 tablet 1  . amiodarone (PACERONE) 200 MG tablet Take 200 mg by mouth daily.    .Marland Kitchenapixaban (ELIQUIS) 5 MG TABS tablet Take 5 mg by mouth 2 (two) times daily.    .Marland Kitchenglucose blood (ONE TOUCH ULTRA TEST) test strip Use as instructed 100 each 12  . hydrochlorothiazide (HYDRODIURIL) 25 MG tablet Take 1 tablet (25 mg total) by mouth daily. 90 tablet 3  . levothyroxine (SYNTHROID) 88 MCG tablet Take 1 tablet (88 mcg total) by mouth daily before breakfast. 90 tablet 1  . metFORMIN (GLUCOPHAGE) 1000 MG tablet Take 1 tablet (1,000 mg total) by mouth 2 (two) times daily with a meal. 180 tablet 1  . metoprolol succinate (TOPROL-XL) 50 MG 24 hr tablet   0  . NONFORMULARY OR COMPOUNDED ITEM Anti-inflammatory Cream-[facility] 1 refill    . PARoxetine  (PAXIL-CR) 25 MG 24 hr tablet take 1 tablet by mouth once daily 90 tablet 3  . zolpidem (AMBIEN) 10 MG tablet take 1 tablet by mouth at bedtime if needed 90 tablet 1  . ipratropium (ATROVENT) 0.03 % nasal spray Place 2 sprays into both nostrils every 12 (twelve) hours. 30 mL 12   Facility-Administered Medications Prior to Visit  Medication Dose Route Frequency Provider Last Rate Last Dose  . cyanocobalamin ((VITAMIN B-12)) injection 1,000 mcg  1,000 mcg Intramuscular Once TJanith Lima MD        ROS See HPI  Objective:  BP 130/82   Pulse 78   Temp 98.4 F (36.9 C) (Oral)   Resp 16   Wt 222 lb (100.7 kg)   SpO2 93%   BMI 34.77 kg/m   BP Readings from Last 3 Encounters:  01/02/16 130/82  11/28/15 118/82  11/02/15 110/70    Wt Readings from Last 3 Encounters:  01/02/16 222 lb (100.7 kg)  11/28/15 224 lb (101.6 kg)  11/02/15 222 lb 9.6 oz (101 kg)    Physical Exam  Constitutional: Kirsten [name] is oriented to person, place, and time. No distress.  HENT:  Right Ear: Tympanic membrane, external ear and ear canal normal.  Left Ear: Tympanic membrane, external ear and ear canal normal.  Nose: Mucosal edema and rhinorrhea present. Right sinus exhibits no maxillary sinus tenderness and no frontal sinus tenderness. Left sinus exhibits no maxillary sinus tenderness and no frontal sinus tenderness.  Mouth/Throat: Uvula is midline. Posterior oropharyngeal erythema present. No oropharyngeal exudate.  Cardiovascular: Normal rate and normal heart sounds.   Pulmonary/Chest: No respiratory distress. Kirsten [name] has wheezes. Kirsten [name] has rales.  Musculoskeletal: Kirsten [name] exhibits no edema.  Neurological: Kirsten [name] is alert and oriented to person, place, and time.  Vitals reviewed.   Lab Results  Component Value Date   WBC 10.8 (H) 11/28/2015   HGB 14.2 11/28/2015   HCT 42.1 11/28/2015   PLT 305.0 11/28/2015   GLUCOSE 97 11/28/2015   CHOL 201 (H) 07/18/2015   TRIG 336.0 (H) 07/18/2015   HDL 39.20 07/18/2015    LDLDIRECT 111.0 07/18/2015   LDLCALC SEE COMMENT 11/26/2013   ALT 22 07/18/2015   AST 17 07/18/2015   NA 143 11/28/2015   K 3.7 11/28/2015   CL 101 11/28/2015   CREATININE 0.71 11/28/2015   BUN 12 11/28/2015   CO2 33 (H) 11/28/2015   TSH 2.44 11/28/2015   INR 1.1 11/27/2013   HGBA1C 6.5 11/28/2015   MICROALBUR <0.7 07/18/2015    Dg Chest 2 View  Result Date: 10/07/2015 CLINICAL DATA:  Cough and chest congestion for 3 weeks. Low-grade fever. Hypoxia. Chest pain. Atrial fibrillation. EXAM: CHEST  2 VIEW COMPARISON:  05/28/2011 FINDINGS: The heart size and mediastinal contours are within normal limits. New linear opacity seen in left lung base, which may be due to atelectasis or scarring. No evidence of pulmonary consolidation or edema. No evidence of pneumothorax or pleural effusion. IMPRESSION: Left basilar atelectasis versus scarring. Electronically Signed   By: Earle Gell M.D.   On: 10/07/2015 16:45   FeNO Testing of 11.  Assessment & Plan:   Kirsten [name] was seen today for cough and nasal congestion.  Diagnoses and all orders for this visit:  Cough -     omeprazole (PRILOSEC) 20 MG capsule; Take 1 capsule (20 mg total) by mouth daily. -     cetirizine (ZYRTEC) 10 MG tablet; Take 1 tablet (10 mg total) by mouth daily. -     promethazine-dextromethorphan (PROMETHAZINE-DM) 6.25-15 MG/5ML syrup; Take 5 mLs by mouth 3 (three) times daily as needed for cough. -     Cancel: CT CHEST HIGH RESOLUTION; Future -     Basic metabolic panel; Future -     Cancel: CT CHEST HIGH RESOLUTION; Future -     CT Chest W Contrast; Future  Mild persistent asthma with acute exacerbation -     ipratropium-albuterol (DUONEB) 0.5-2.5 (3) MG/3ML nebulizer solution 3 mL; Take 3 mLs by nebulization every 6 (six) hours. -     DG Chest 2 View; Future -     dextromethorphan-guaiFENesin (MUCINEX DM) 30-600 MG 12hr tablet; Take 1 tablet by mouth 2 (two) times daily as needed for cough. -     Discontinue:  benzonatate (TESSALON) 100 MG capsule; Take 1 capsule (100 mg total) by mouth 3 (three) times daily as needed for cough. -     albuterol (PROVENTIL HFA;VENTOLIN HFA) 108 (90 Base) MCG/ACT inhaler; Inhale 2 puffs into the lungs every 6 (six) hours as needed for wheezing or shortness of breath. -     ipratropium (ATROVENT) 0.03 % nasal spray; Place 2 sprays into both nostrils every 12 (twelve) hours. -     cetirizine (ZYRTEC) 10 MG tablet; Take 1 tablet (10 mg total) by mouth daily.  Abnormal lung sounds -     DG Chest 2 View; Future -     dextromethorphan-guaiFENesin (MUCINEX DM) 30-600 MG 12hr tablet; Take 1 tablet by mouth 2 (two) times daily as needed for cough. -  Discontinue: benzonatate (TESSALON) 100 MG capsule; Take 1 capsule (100 mg total) by mouth 3 (three) times daily as needed for cough. -     albuterol (PROVENTIL HFA;VENTOLIN HFA) 108 (90 Base) MCG/ACT inhaler; Inhale 2 puffs into the lungs every 6 (six) hours as needed for wheezing or shortness of breath. -     Cancel: CT CHEST HIGH RESOLUTION; Future -     Basic metabolic panel; Future -     Cancel: CT CHEST HIGH RESOLUTION; Future -     CT Chest W Contrast; Future  Abnormality of lung on CXR -     Cancel: CT CHEST HIGH RESOLUTION; Future -     Basic metabolic panel; Future -     Cancel: CT CHEST HIGH RESOLUTION; Future -     CT Chest W Contrast; Future   I have discontinued Kirsten [name]'s benzonatate. I am also having her start on dextromethorphan-guaiFENesin, albuterol, omeprazole, cetirizine, and promethazine-dextromethorphan. Additionally, I am having her maintain her glucose blood, amiodarone, apixaban, metoprolol succinate, PARoxetine, hydrochlorothiazide, ALPRAZolam, zolpidem, metFORMIN, levothyroxine, NONFORMULARY OR COMPOUNDED ITEM, and ipratropium. We administered ipratropium-albuterol. We will continue to administer cyanocobalamin and ipratropium-albuterol.  Meds ordered this encounter  Medications  .  ipratropium-albuterol (DUONEB) 0.5-2.5 (3) MG/3ML nebulizer solution 3 mL  . dextromethorphan-guaiFENesin (MUCINEX DM) 30-600 MG 12hr tablet    Sig: Take 1 tablet by mouth 2 (two) times daily as needed for cough.    Dispense:  14 tablet    Refill:  0    Order Specific Question:   Supervising Provider    Answer:   Cassandria Anger [[id number]]  . DISCONTD: benzonatate (TESSALON) 100 MG capsule    Sig: Take 1 capsule (100 mg total) by mouth 3 (three) times daily as needed for cough.    Dispense:  20 capsule    Refill:  0    Order Specific Question:   Supervising Provider    Answer:   Cassandria Anger [[id number]]  . albuterol (PROVENTIL HFA;VENTOLIN HFA) 108 (90 Base) MCG/ACT inhaler    Sig: Inhale 2 puffs into the lungs every 6 (six) hours as needed for wheezing or shortness of breath.    Dispense:  1 Inhaler    Refill:  0    Order Specific Question:   Supervising Provider    Answer:   Cassandria Anger [[id number]]  . ipratropium (ATROVENT) 0.03 % nasal spray    Sig: Place 2 sprays into both nostrils every 12 (twelve) hours.    Dispense:  30 mL    Refill:  12    Order Specific Question:   Supervising Provider    Answer:   Cassandria Anger [[id number]]  . omeprazole (PRILOSEC) 20 MG capsule    Sig: Take 1 capsule (20 mg total) by mouth daily.    Dispense:  30 capsule    Refill:  3    Order Specific Question:   Supervising Provider    Answer:   Cassandria Anger [[id number]]  . cetirizine (ZYRTEC) 10 MG tablet    Sig: Take 1 tablet (10 mg total) by mouth daily.    Dispense:  30 tablet    Refill:  0    Order Specific Question:   Supervising Provider    Answer:   Cassandria Anger [[id number]]  . promethazine-dextromethorphan (PROMETHAZINE-DM) 6.25-15 MG/5ML syrup    Sig: Take 5 mLs by mouth 3 (three) times daily as needed for cough.    Dispense:  240 mL    Refill:  0    Order Specific Question:   Supervising Provider    Answer:   Cassandria Anger [[id number]]    Follow-up: No Follow-up on  file.  Wilfred Lacy, NP

## 2016-01-02 NOTE — Progress Notes (Signed)
Pre visit review using our clinic review tool, if applicable. No additional management support is needed unless otherwise documented below in the visit note. 

## 2016-01-02 NOTE — Addendum Note (Signed)
Addended by: Wilfred Lacy L on: 01/02/2016 04:44 PM   Modules accepted: Orders

## 2016-01-02 NOTE — Patient Instructions (Addendum)
FeNO test in office is 11 which indicates no benefit from ICS. Current treatment for possible GERD (omprazole) and allergic rhinitis ( atrovent, flonase and zyrtec). Pending CXR.  Go for CXR in basesment You will be called with results.  Follow up with pulmonologist in 1week.

## 2016-01-03 ENCOUNTER — Ambulatory Visit: Payer: PPO

## 2016-01-07 DIAGNOSIS — G4733 Obstructive sleep apnea (adult) (pediatric): Secondary | ICD-10-CM | POA: Diagnosis not present

## 2016-01-10 ENCOUNTER — Other Ambulatory Visit: Payer: Self-pay | Admitting: Internal Medicine

## 2016-01-10 ENCOUNTER — Encounter: Payer: Self-pay | Admitting: Pulmonary Disease

## 2016-01-10 ENCOUNTER — Ambulatory Visit (INDEPENDENT_AMBULATORY_CARE_PROVIDER_SITE_OTHER): Payer: PPO | Admitting: Pulmonary Disease

## 2016-01-10 VITALS — BP 124/68 | HR 68 | Temp 98.3°F | Ht 67.0 in | Wt 223.0 lb

## 2016-01-10 DIAGNOSIS — I10 Essential (primary) hypertension: Secondary | ICD-10-CM

## 2016-01-10 DIAGNOSIS — G4733 Obstructive sleep apnea (adult) (pediatric): Secondary | ICD-10-CM

## 2016-01-10 MED ORDER — AZITHROMYCIN 250 MG PO TABS: ORAL_TABLET | ORAL | 0 refills | Status: DC

## 2016-01-10 MED ORDER — BUDESONIDE-FORMOTEROL FUMARATE 160-4.5 MCG/ACT IN AERO: 2.0000 | INHALATION_SPRAY | Freq: Two times a day (BID) | RESPIRATORY_TRACT | 0 refills | Status: DC

## 2016-01-10 MED ORDER — PREDNISONE 10 MG PO TABS: ORAL_TABLET | ORAL | 0 refills | Status: DC

## 2016-01-10 NOTE — Patient Instructions (Signed)
Will give you a Z-Pak. Prednisone taper starting at 40 mg. Reduce dose to 10 mg every 3 days. We will give samples of Symbicort to use during this acute episode.  Follow-up with Dr. Elsworth Soho.

## 2016-01-10 NOTE — Progress Notes (Signed)
Kirsten [name]    [id number]    11-Jan-1956  Primary Care Physician:Kirsten Ronnald Ramp, MD  Referring Physician: [name], MD [address], [place] [address]  Chief complaint:  Acute visit for her dyspnea, cough, wheezing  HPI: Kirsten [name] is a 60 year old with history of mild persistent asthma, OSA. She has complains of sinus congestion, sinus heaviness, cough, wheezing, dyspnea over the past one month. She has been treated with a course of Levaquin and short prednisone taper with no improvement in symptoms. She has the albuterol rescue inhaler and Atrovent nasal spray. She also continues on cetirizine and Mucinex DM for cough.  Outpatient Encounter Prescriptions as of 01/10/2016  Medication Sig  . albuterol (PROVENTIL HFA;VENTOLIN HFA) 108 (90 Base) MCG/ACT inhaler Inhale 2 puffs into the lungs every 6 (six) hours as needed for wheezing or shortness of breath.  . ALPRAZolam (XANAX) 0.25 MG tablet take 1 tablet by mouth once daily if needed  . amiodarone (PACERONE) 200 MG tablet Take 200 mg by mouth daily.  Marland Kitchen apixaban (ELIQUIS) 5 MG TABS tablet Take 5 mg by mouth 2 (two) times daily.  . cetirizine (ZYRTEC) 10 MG tablet Take 1 tablet (10 mg total) by mouth daily.  Marland Kitchen dextromethorphan-guaiFENesin (MUCINEX DM) 30-600 MG 12hr tablet Take 1 tablet by mouth 2 (two) times daily as needed for cough.  Marland Kitchen glucose blood (ONE TOUCH ULTRA TEST) test strip Use as instructed  . hydrochlorothiazide (HYDRODIURIL) 25 MG tablet Take 1 tablet (25 mg total) by mouth daily.  Marland Kitchen ipratropium (ATROVENT) 0.03 % nasal spray Place 2 sprays into both nostrils every 12 (twelve) hours.  Marland Kitchen levothyroxine (SYNTHROID) 88 MCG tablet Take 1 tablet (88 mcg total) by mouth daily before breakfast.  . metFORMIN (GLUCOPHAGE) 1000 MG tablet take 1 tablet by mouth twice a day with A MEAL  . metoprolol succinate (TOPROL-XL) 50 MG 24 hr tablet Take 50 mg by mouth daily.   . NONFORMULARY OR COMPOUNDED  ITEM Anti-inflammatory Cream-[facility] 1 refill  . omeprazole (PRILOSEC) 20 MG capsule Take 1 capsule (20 mg total) by mouth daily.  Marland Kitchen PARoxetine (PAXIL-CR) 25 MG 24 hr tablet take 1 tablet by mouth once daily  . promethazine-dextromethorphan (PROMETHAZINE-DM) 6.25-15 MG/5ML syrup Take 5 mLs by mouth 3 (three) times daily as needed for cough.  . zolpidem (AMBIEN) 10 MG tablet take 1 tablet by mouth at bedtime if needed   Facility-Administered Encounter Medications as of 01/10/2016  Medication  . cyanocobalamin ((VITAMIN B-12)) injection 1,000 mcg  . ipratropium-albuterol (DUONEB) 0.5-2.5 (3) MG/3ML nebulizer solution 3 mL    Allergies as of 01/10/2016 - Review Complete 01/10/2016  Allergen Reaction Noted  . Factive [gemifloxacin mesylate] Rash 06/07/2010  . Crestor [rosuvastatin]  10/12/2013  . Penicillins    . Sulfonamide derivatives    . Gemifloxacin Rash 10/04/2014    Past Medical History:  Diagnosis Date  . A-fib ([place])   . Anxiety disorder   . Gastroschisis    umphalocele, rotated organs abd repair until age 40  . GERD (gastroesophageal reflux disease)   . HTN (hypertension)   . Hyperlipidemia   . IBS (irritable bowel syndrome)   . Morbid obesity ([place] [place])    Target wt - 185  for BMI < 30  . Obesity   . Pulmonary fibrosis ([place])   . SBO (small bowel obstruction)    Resolved with NG/Bowel rest around 2009  . Sleep apnea   .  Type II or unspecified type diabetes mellitus without mention of complication, not stated as uncontrolled     Past Surgical History:  Procedure Laterality Date  . BREAST BIOPSY Left   . CHOLECYSTECTOMY  1992  . Newborn Surgery - GI - ORGANS OUTSIDE ABDOMEN    . Small Bowel Repair    . TUBAL LIGATION  1988    Family History  Problem Relation Age of Onset  . Lymphoma Mother   . Diabetes type II Father   . Diabetes type I Son   . Diabetes Maternal Grandmother   . Goiter Neg Hx   . Colon cancer Neg Hx   . Breast cancer Neg Hx      Social History   Social History  . Marital status: Married    Spouse name: N/A  . Number of children: N/A  . Years of education: N/A   Occupational History  . Retired     Research scientist (physical sciences) at [employer] Topics  . Smoking status: Never Smoker  . Smokeless tobacco: Never Used     Comment: "tried as a teen"  . Alcohol use 1.2 oz/week    2 Glasses of wine per week     Comment: rare  . Drug use: No  . Sexual activity: Yes    Birth control/ protection: Surgical   Other Topics Concern  . Not on file   Social History Narrative   Regular Exercise -  YES   Daily Caffeine Use:  2         Review of systems: Review of Systems  Constitutional: Negative for fever and chills.  HENT: Negative.   Eyes: Negative for blurred vision.  Respiratory: as per HPI  Cardiovascular: Negative for chest pain and palpitations.  Gastrointestinal: Negative for vomiting, diarrhea, blood per rectum. Genitourinary: Negative for dysuria, urgency, frequency and hematuria.  Musculoskeletal: Negative for myalgias, back pain and joint pain.  Skin: Negative for itching and rash.  Neurological: Negative for dizziness, tremors, focal weakness, seizures and loss of consciousness.  Endo/Heme/Allergies: Negative for environmental allergies.  Psychiatric/Behavioral: Negative for depression, suicidal ideas and hallucinations.  All other systems reviewed and are negative.  Physical Exam: Blood pressure 124/68, pulse 68, temperature 98.3 F (36.8 C), temperature source Oral, height 5' 7"  (1.702 m), weight 223 lb (101.2 kg), SpO2 93 %. Gen:      No acute distress HEENT:  EOMI, sclera anicteric Neck:     No masses; no thyromegaly Lungs:    Scattered exp wheeze; normal respiratory effort CV:         Regular rate and rhythm; no murmurs Abd:      + bowel sounds; soft, non-tender; no palpable masses, no distension Ext:    No edema; adequate peripheral perfusion Skin:      Warm and dry;  no rash Neuro: alert and oriented x 3 Psych: normal mood and affect  Data Reviewed: Chest x-ray 01/02/16-bilateral streaky airspace opacities. Images reviewed.  Assessment:  Asthmatic bronchitis with acute exacerbation. She still wheezing in the office today. I'll treat her with another course of antibiotic with a Z-Pak and prednisone. She also get samples of Symbicort to be used during this acute exacerbation.  Lung opacities Chest x-ray reviewed with bilateral opacities. This appears to be chronic dating back to CT scan and chest x-ray as far back as 2011. CT scan was ordered by primary care office. We can hold off on that while we treat her through this acute  exacerbation. Return to clinic with Dr. Elsworth Soho for reevaluation.  Plan/Recommendations: - Z pack - Pred taper starting at 40 mg. Reduce dose by 107m every 3 days - Samples of symbicort.  PMarshell GarfinkelMD [facility] Pager [phone]/28/2017, 5:00 PM  CC: JJanith Lima MD

## 2016-01-12 DIAGNOSIS — G4733 Obstructive sleep apnea (adult) (pediatric): Secondary | ICD-10-CM | POA: Diagnosis not present

## 2016-01-16 NOTE — Progress Notes (Deleted)
Patient ID: Kirsten [name], female   DOB: [birth year], 60 y.o.   MRN: [id number] ANNUAL PREVENTATIVE CARE GYN  ENCOUNTER NOTE  Subjective:       Kirsten [name] is a 60 y.o. No obstetric history on file. female here for a routine annual gynecologic exam.  Current complaints: 1   Gynecologic History No LMP recorded. Patient is postmenopausal. Contraception: tubal ligation Last Pap: 02/2013 -neg/neg. Results were: normal Last mammogram: 08/2015 birad 1 . Results were: normal  Obstetric History OB History  No data available    Past Medical History:  Diagnosis Date  . A-fib ([place])   . Anxiety disorder   . Gastroschisis    umphalocele, rotated organs abd repair until age 51  . GERD (gastroesophageal reflux disease)   . HTN (hypertension)   . Hyperlipidemia   . IBS (irritable bowel syndrome)   . Morbid obesity ([place])    Target wt - 185  for BMI < 30  . Obesity   . Pulmonary fibrosis ([place])   . SBO (small bowel obstruction)    Resolved with NG/Bowel rest around 2009  . Sleep apnea   . Type II or unspecified type diabetes mellitus without mention of complication, not stated as uncontrolled     Past Surgical History:  Procedure Laterality Date  . BREAST BIOPSY Left   . CHOLECYSTECTOMY  1992  . Newborn Surgery - GI - ORGANS OUTSIDE ABDOMEN    . Small Bowel Repair    . TUBAL LIGATION  1988    Current Outpatient Prescriptions on File Prior to Visit  Medication Sig Dispense Refill  . albuterol (PROVENTIL HFA;VENTOLIN HFA) 108 (90 Base) MCG/ACT inhaler Inhale 2 puffs into the lungs every 6 (six) hours as needed for wheezing or shortness of breath. 1 Inhaler 0  . ALPRAZolam (XANAX) 0.25 MG tablet take 1 tablet by mouth once daily if needed 90 tablet 1  . amiodarone (PACERONE) 200 MG tablet Take 200 mg by mouth daily.    Marland Kitchen apixaban (ELIQUIS) 5 MG TABS tablet Take 5 mg by mouth 2 (two) times daily.    Marland Kitchen azithromycin (ZITHROMAX) 250 MG tablet Take as directed 6 tablet 0  .  budesonide-formoterol (SYMBICORT) 160-4.5 MCG/ACT inhaler Inhale 2 puffs into the lungs 2 (two) times daily. 2 Inhaler 0  . cetirizine (ZYRTEC) 10 MG tablet Take 1 tablet (10 mg total) by mouth daily. 30 tablet 0  . dextromethorphan-guaiFENesin (MUCINEX DM) 30-600 MG 12hr tablet Take 1 tablet by mouth 2 (two) times daily as needed for cough. 14 tablet 0  . glucose blood (ONE TOUCH ULTRA TEST) test strip Use as instructed 100 each 12  . hydrochlorothiazide (HYDRODIURIL) 25 MG tablet Take 1 tablet (25 mg total) by mouth daily. 90 tablet 3  . ipratropium (ATROVENT) 0.03 % nasal spray Place 2 sprays into both nostrils every 12 (twelve) hours. 30 mL 12  . levothyroxine (SYNTHROID) 88 MCG tablet Take 1 tablet (88 mcg total) by mouth daily before breakfast. 90 tablet 1  . metFORMIN (GLUCOPHAGE) 1000 MG tablet take 1 tablet by mouth twice a day with A MEAL 180 tablet 1  . metoprolol succinate (TOPROL-XL) 50 MG 24 hr tablet Take 50 mg by mouth daily.   0  . NONFORMULARY OR COMPOUNDED ITEM Anti-inflammatory Cream-[facility] 1 refill    . omeprazole (PRILOSEC) 20 MG capsule Take 1 capsule (20 mg total) by mouth daily. 30 capsule 3  . PARoxetine (PAXIL-CR) 25 MG 24 hr tablet take 1 tablet  by mouth once daily 90 tablet 3  . predniSONE (DELTASONE) 10 MG tablet Take 4 tabs by mouth for 3 days, then 3 for 3 days, 2 for 3 days, 1 for 3 days and stop 30 tablet 0  . promethazine-dextromethorphan (PROMETHAZINE-DM) 6.25-15 MG/5ML syrup Take 5 mLs by mouth 3 (three) times daily as needed for cough. 240 mL 0  . zolpidem (AMBIEN) 10 MG tablet take 1 tablet by mouth at bedtime if needed 90 tablet 1   Current Facility-Administered Medications on File Prior to Visit  Medication Dose Route Frequency Provider Last Rate Last Dose  . cyanocobalamin ((VITAMIN B-12)) injection 1,000 mcg  1,000 mcg Intramuscular Once [name], MD      . ipratropium-albuterol (DUONEB) 0.5-2.5 (3) MG/3ML nebulizer solution 3 mL  3 mL  Nebulization Q6H Flossie Buffy, NP   3 mL at 01/02/16 1319    Allergies  Allergen Reactions  . Factive [Gemifloxacin Mesylate] Rash  . Crestor [Rosuvastatin]     GI upset  . Penicillins     REACTION: rash  . Sulfonamide Derivatives     REACTION: rash  . Gemifloxacin Rash    Social History   Social History  . Marital status: Married    Spouse name: N/A  . Number of children: N/A  . Years of education: N/A   Occupational History  . 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 Smoking status: Never Smoker  . Smokeless tobacco: Never Used     Comment: "tried as a teen"  . Alcohol use 1.2 oz/week    2 Glasses of wine per week     Comment: rare  . Drug use: No  . Sexual activity: Yes    Birth control/ protection: Surgical   Other Topics Concern  . Not on file   Social History Narrative   Regular Exercise -  YES   Daily Caffeine Use:  2          Family History  Problem Relation Age of Onset  . Lymphoma Mother   . Diabetes type II Father   . Diabetes type I Son   . Diabetes Maternal Grandmother   . Goiter Neg Hx   . Colon cancer Neg Hx   . Breast cancer Neg Hx     The following portions of the patient's history were reviewed and updated as appropriate: allergies, current medications, past family history, past medical history, past social history, past surgical history and problem list.  Review of Systems ROS Review of Systems - General ROS: negative for - chills, fatigue, fever, hot flashes, night sweats, weight gain or weight loss Psychological ROS: negative for - anxiety, decreased libido, depression, mood swings, physical abuse or sexual abuse Ophthalmic ROS: negative for - blurry vision, eye pain or loss of vision ENT ROS: negative for - headaches, hearing change, visual changes or vocal changes Allergy and Immunology ROS: negative for - hives, itchy/watery eyes or seasonal allergies Hematological and Lymphatic ROS:  negative for - bleeding problems, bruising, swollen lymph nodes or weight loss Endocrine ROS: negative for - galactorrhea, hair pattern changes, hot flashes, malaise/lethargy, mood swings, palpitations, polydipsia/polyuria, skin changes, temperature intolerance or unexpected weight changes Breast ROS: negative for - new or changing breast lumps or nipple discharge Respiratory ROS: negative for - cough or shortness of breath Cardiovascular ROS: negative for - chest pain, irregular heartbeat, palpitations or shortness of breath Gastrointestinal ROS: no abdominal pain, change  in bowel habits, or black or bloody stools Genito-Urinary ROS: no dysuria, trouble voiding, or hematuria Musculoskeletal ROS: negative for - joint pain or joint stiffness Neurological ROS: negative for - bowel and bladder control changes Dermatological ROS: negative for rash and skin lesion changes   Objective:   There were no vitals taken for this visit. CONSTITUTIONAL: Well-developed, well-nourished female in no acute distress.  PSYCHIATRIC: Normal mood and affect. Normal behavior. Normal judgment and thought content. [place]: Alert and oriented to person, place, and time. Normal muscle tone coordination. No cranial nerve deficit noted. HENT:  Normocephalic, atraumatic, External right and left ear normal. Oropharynx is clear and moist EYES: Conjunctivae and EOM are normal. Pupils are equal, round, and reactive to light. No scleral icterus.  NECK: Normal range of motion, supple, no masses.  Normal thyroid.  SKIN: Skin is warm and dry. No rash noted. Not diaphoretic. No erythema. No pallor. CARDIOVASCULAR: Normal heart rate noted, regular rhythm, no murmur. RESPIRATORY: Clear to auscultation bilaterally. Effort and breath sounds normal, no problems with respiration noted. BREASTS: Symmetric in size. No masses, skin changes, nipple drainage, or lymphadenopathy. ABDOMEN: Soft, normal bowel sounds, no distention noted.  No  tenderness, rebound or guarding. Multiple surgical scars healed BLADDER: Normal PELVIC:  External Genitalia: Normal  BUS: Normal  Vagina: Atrophic; introitus narrowed, requiring pediatric speculum  Cervix: Normal  Uterus: Midplane, not enlarged  Adnexa: Nonpalpable, nontender  RV: perianal erythema, No Rectal Masses and Normal Sphincter tone  MUSCULOSKELETAL: Normal range of motion. No tenderness.  No cyanosis, clubbing, or edema.  2+ distal pulses. LYMPHATIC: No Axillary, Supraclavicular, or Inguinal Adenopathy.    Assessment:   Annual gynecologic examination 60 y.o. Contraception: tubal ligation bmi-34. Menopausal state, Asymptomatic.      Plan:  Pap: Due 2018 Mammogram: Ordered Stool Guaiac Testing:  Ordered Labs: thru pcp Routine preventative health maintenance measures emphasized: Exercise/Diet/Weight control, Tobacco Warnings, Alcohol/Substance use risks and Stress Management Return to [place], [place]   Note: This dictation was prepared with Dragon dictation along with smaller phrase technology. Any transcriptional errors that result from this process are unintentional.  '

## 2016-01-18 ENCOUNTER — Encounter: Payer: PPO | Admitting: Obstetrics and Gynecology

## 2016-01-18 DIAGNOSIS — G4733 Obstructive sleep apnea (adult) (pediatric): Secondary | ICD-10-CM | POA: Diagnosis not present

## 2016-01-25 LAB — HM PAP SMEAR

## 2016-01-31 ENCOUNTER — Encounter: Payer: Self-pay | Admitting: Pulmonary Disease

## 2016-01-31 ENCOUNTER — Ambulatory Visit (INDEPENDENT_AMBULATORY_CARE_PROVIDER_SITE_OTHER): Payer: PPO | Admitting: Obstetrics and Gynecology

## 2016-01-31 ENCOUNTER — Encounter: Payer: Self-pay | Admitting: Obstetrics and Gynecology

## 2016-01-31 VITALS — BP 147/78 | HR 87 | Ht 67.0 in | Wt 221.5 lb

## 2016-01-31 DIAGNOSIS — N895 Stricture and atresia of vagina: Secondary | ICD-10-CM

## 2016-01-31 DIAGNOSIS — Z1239 Encounter for other screening for malignant neoplasm of breast: Secondary | ICD-10-CM

## 2016-01-31 DIAGNOSIS — Z1211 Encounter for screening for malignant neoplasm of colon: Secondary | ICD-10-CM

## 2016-01-31 DIAGNOSIS — E6609 Other obesity due to excess calories: Secondary | ICD-10-CM | POA: Diagnosis not present

## 2016-01-31 DIAGNOSIS — Z87440 Personal history of urinary (tract) infections: Secondary | ICD-10-CM | POA: Insufficient documentation

## 2016-01-31 DIAGNOSIS — E669 Obesity, unspecified: Secondary | ICD-10-CM | POA: Insufficient documentation

## 2016-01-31 DIAGNOSIS — N952 Postmenopausal atrophic vaginitis: Secondary | ICD-10-CM | POA: Diagnosis not present

## 2016-01-31 DIAGNOSIS — Z6835 Body mass index (BMI) 35.0-35.9, adult: Secondary | ICD-10-CM

## 2016-01-31 DIAGNOSIS — Z01419 Encounter for gynecological examination (general) (routine) without abnormal findings: Secondary | ICD-10-CM

## 2016-01-31 DIAGNOSIS — Z6834 Body mass index (BMI) 34.0-34.9, adult: Secondary | ICD-10-CM | POA: Insufficient documentation

## 2016-01-31 DIAGNOSIS — Z1231 Encounter for screening mammogram for malignant neoplasm of breast: Secondary | ICD-10-CM

## 2016-01-31 LAB — POCT URINALYSIS DIPSTICK
BILIRUBIN UA: NEGATIVE
Blood, UA: NEGATIVE
Glucose, UA: NEGATIVE
KETONES UA: NEGATIVE
LEUKOCYTES UA: NEGATIVE
Nitrite, UA: NEGATIVE
Protein, UA: NEGATIVE
SPEC GRAV UA: 1.01
Urobilinogen, UA: NEGATIVE
pH, UA: 5

## 2016-01-31 NOTE — Progress Notes (Signed)
Patient ID: Kirsten [name], female   DOB: 1955-09-21, 60 y.o.   MRN: [id number] ANNUAL PREVENTATIVE CARE GYN  ENCOUNTER NOTE  Subjective:       Kirsten [name] is a 60 y.o.  G3 P3003. female here for a routine annual gynecologic exam.  Current com plaints: 1. Recent UTI tx with atb would like repeat u/a and c/s per pt request    Gynecologic History No LMP recorded. Patient is postmenopausal. Contraception: tubal ligation Last Pap: 02/2013 -neg/neg. Results were: normal Last mammogram: 08/2015 birad 1 . Results were: normal  Obstetric History OB History  No data available    Past Medical History:  Diagnosis Date  . A-fib ([place])   . Anxiety disorder   . 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 GERD (gastroesophageal reflux disease)   . HTN (hypertension)   . Hyperlipidemia   . IBS (irritable bowel syndrome)   . Morbid obesity ([place])    Target wt - 185  for BMI < 30  . Obesity   . Pulmonary fibrosis ([place])   . SBO (small bowel obstruction)    Resolved with NG/Bowel rest around 2009  . Sleep apnea   . Type II or unspecified type diabetes mellitus without mention of complication, not stated as uncontrolled     Past Surgical History:  Procedure Laterality Date  . BREAST BIOPSY Left   . CHOLECYSTECTOMY  1992  . Newborn Surgery - GI - ORGANS OUTSIDE ABDOMEN    . Small Bowel Repair    . TUBAL LIGATION  1988    Current Outpatient Prescriptions on File Prior to Visit  Medication Sig Dispense Refill  . albuterol (PROVENTIL HFA;VENTOLIN HFA) 108 (90 Base) MCG/ACT inhaler Inhale 2 puffs into the lungs every 6 (six) hours as needed for wheezing or shortness of breath. 1 Inhaler 0  . ALPRAZolam (XANAX) 0.25 MG tablet take 1 tablet by mouth once daily if needed 90 tablet 1  . amiodarone (PACERONE) 200 MG tablet Take 200 mg by mouth daily.    Marland Kitchen apixaban (ELIQUIS) 5 MG TABS tablet Take 5 mg by mouth 2 (two) times daily.    Marland Kitchen azithromycin (ZITHROMAX) 250  MG tablet Take as directed 6 tablet 0  . budesonide-formoterol (SYMBICORT) 160-4.5 MCG/ACT inhaler Inhale 2 puffs into the lungs 2 (two) times daily. 2 Inhaler 0  . cetirizine (ZYRTEC) 10 MG tablet Take 1 tablet (10 mg total) by mouth daily. 30 tablet 0  . dextromethorphan-guaiFENesin (MUCINEX DM) 30-600 MG 12hr tablet Take 1 tablet by mouth 2 (two) times daily as needed for cough. 14 tablet 0  . glucose blood (ONE TOUCH ULTRA TEST) test strip Use as instructed 100 each 12  . hydrochlorothiazide (HYDRODIURIL) 25 MG tablet Take 1 tablet (25 mg total) by mouth daily. 90 tablet 3  . ipratropium (ATROVENT) 0.03 % nasal spray Place 2 sprays into both nostrils every 12 (twelve) hours. 30 mL 12  . levothyroxine (SYNTHROID) 88 MCG tablet Take 1 tablet (88 mcg total) by mouth daily before breakfast. 90 tablet 1  . metFORMIN (GLUCOPHAGE) 1000 MG tablet take 1 tablet by mouth twice a day with A MEAL 180 tablet 1  . metoprolol succinate (TOPROL-XL) 50 MG 24 hr tablet Take 50 mg by mouth daily.   0  . NONFORMULARY OR COMPOUNDED ITEM Anti-inflammatory Cream-[facility] 1 refill    . omeprazole (PRILOSEC) 20 MG capsule Take 1 capsule (20 mg total) by mouth daily. [place]  capsule 3  . PARoxetine (PAXIL-CR) 25 MG 24 hr tablet take 1 tablet by mouth once daily 90 tablet 3  . predniSONE (DELTASONE) 10 MG tablet Take 4 tabs by mouth for 3 days, then 3 for 3 days, 2 for 3 days, 1 for 3 days and stop 30 tablet 0  . promethazine-dextromethorphan (PROMETHAZINE-DM) 6.25-15 MG/5ML syrup Take 5 mLs by mouth 3 (three) times daily as needed for cough. 240 mL 0  . zolpidem (AMBIEN) 10 MG tablet take 1 tablet by mouth at bedtime if needed 90 tablet 1   Current Facility-Administered Medications on File Prior to Visit  Medication Dose Route Frequency Provider Last Rate Last Dose  . cyanocobalamin ((VITAMIN B-12)) injection 1,000 mcg  1,000 mcg Intramuscular Once [name], MD      . ipratropium-albuterol (DUONEB) 0.5-2.5  (3) MG/3ML nebulizer solution 3 mL  3 mL Nebulization Q6H Flossie Buffy, NP   3 mL at 01/02/16 1319    Allergies  Allergen Reactions  . Factive [Gemifloxacin Mesylate] Rash  . Crestor [Rosuvastatin]     GI upset  . Penicillins     REACTION: rash  . Sulfonamide Derivatives     REACTION: rash  . Gemifloxacin Rash    Social History   Social History  . Marital status: Married    Spouse name: N/A  . Number of children: N/A  . Years of education: N/A   Occupational History  . 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 Smoking status: Never Smoker  . Smokeless tobacco: Never Used     Comment: "tried as a teen"  . Alcohol use 1.2 oz/week    2 Glasses of wine per week     Comment: rare  . Drug use: No  . Sexual activity: Yes    Birth control/ protection: Surgical   Other Topics Concern  . Not on file   Social History Narrative   Regular Exercise -  YES   Daily Caffeine Use:  2          Family History  Problem Relation Age of Onset  . Lymphoma Mother   . Diabetes type II Father   . Diabetes type I Son   . Diabetes Maternal Grandmother   . Goiter Neg Hx   . Colon cancer Neg Hx   . Breast cancer Neg Hx     The following portions of the patient's history were reviewed and updated as appropriate: allergies, current medications, past family history, past medical history, past social history, past surgical history and problem list.  Review of Systems ROS Review of Systems - General ROS: negative for - chills, fatigue, fever, hot flashes, night sweats, weight gain or weight loss Psychological ROS: negative for - anxiety, decreased libido, depression, mood swings, physical abuse or sexual abuse Ophthalmic ROS: negative for - blurry vision, eye pain or loss of vision ENT ROS: negative for - headaches, hearing change, visual changes or vocal changes Allergy and Immunology ROS: negative for - hives, itchy/watery eyes or seasonal  allergies Hematological and Lymphatic ROS: negative for - bleeding problems, bruising, swollen lymph nodes or weight loss Endocrine ROS: negative for - galactorrhea, hair pattern changes, hot flashes, malaise/lethargy, mood swings, palpitations, polydipsia/polyuria, skin changes, temperature intolerance or unexpected weight changes Breast ROS: negative for - new or changing breast lumps or nipple discharge Respiratory ROS: negative for - cough or shortness of breath Cardiovascular ROS: negative for - chest  pain, irregular heartbeat, palpitations or shortness of breath Gastrointestinal ROS: no abdominal pain, change in bowel habits, or black or bloody stools Genito-Urinary ROS: no dysuria, trouble voiding, or hematuria Musculoskeletal ROS: negative for - joint pain or joint stiffness Neurological ROS: negative for - bowel and bladder control changes Dermatological ROS: negative for rash and skin lesion changes   Objective:   Ht 5' 7"  (1.702 m)  BP (!) 147/78   Pulse 87   Ht 5' 7"  (1.702 m)   Wt 221 lb 8 oz (100.5 kg)   LMP  (Exact Date)   BMI 34.69 kg/m   CONSTITUTIONAL: Well-developed, well-nourished female in no acute distress.  PSYCHIATRIC: Normal mood and affect. Normal behavior. Normal judgment and thought content. [place]: Alert and oriented to person, place, and time. Normal muscle tone coordination. No cranial nerve deficit noted. HENT:  Normocephalic, atraumatic, External right and left ear normal. Oropharynx is clear and moist EYES: Conjunctivae and EOM are normal. Pupils are equal, round, and reactive to light. No scleral icterus.  NECK: Normal range of motion, supple, no masses.  Normal thyroid.  SKIN: Skin is warm and dry. No rash noted. Not diaphoretic. No erythema. No pallor. CARDIOVASCULAR: Normal heart rate noted, regular rhythm, no murmur. RESPIRATORY: Clear to auscultation bilaterally. Effort and breath sounds normal, no problems with respiration noted. BREASTS:  Symmetric in size. No masses, skin changes, nipple drainage, or lymphadenopathy. ABDOMEN: Soft, normal bowel sounds, no distention noted.  No tenderness, rebound or guarding. Multiple surgical scars healed BLADDER: Normal PELVIC:  External Genitalia: Normal  BUS: Normal  Vagina: Atrophic; introitus narrowed, requiring pediatric speculum; single digit exam  Cervix: Normal  Uterus: Midplane, not enlarged  Adnexa: Nonpalpable, nontender  RV: External Exam NormaI, No Rectal Masses and Normal Sphincter tone  MUSCULOSKELETAL: Normal range of motion. No tenderness.  No cyanosis, clubbing, or edema.  2+ distal pulses. LYMPHATIC: No Axillary, Supraclavicular, or Inguinal Adenopathy.    Assessment:   Annual gynecologic examination 60 y.o. Contraception: tubal ligation bmi-34. Menopausal state, Asymptomatic. History of recurrent UTI      Plan:  Pap: Due 2018 Mammogram: UTD Stool Guaiac Testing:  Ordered Labs: thru pcp Routine preventative health maintenance measures emphasized: Exercise/Diet/Weight control, Tobacco Warnings, Alcohol/Substance use risks and Stress Management Return to [place], CMA  [name], MD   Note: This dictation was prepared with Dragon dictation along with smaller phrase technology. Any transcriptional errors that result from this process are unintentional.  '

## 2016-01-31 NOTE — Patient Instructions (Signed)
No Pap smear. Mammogram up-to-date Stool guaiac cards given for colon cancer screening Recommend calcium and vitamin D supplementation Continue with healthy eating and exercise with controlled weight loss Return in 1 year    Health Maintenance for Postmenopausal Women Introduction Menopause is a normal process in which your reproductive ability comes to an end. This process happens gradually over a span of months to years, usually between the ages of 59 and 95. Menopause is complete when you have missed 12 consecutive menstrual periods. It is important to talk with your health care provider about some of the most common conditions that affect postmenopausal women, such as heart disease, cancer, and bone loss (osteoporosis). Adopting a healthy lifestyle and getting preventive care can help to promote your health and wellness. Those actions can also lower your chances of developing some of these common conditions. What should I know about menopause? During menopause, you may experience a number of symptoms, such as:  Moderate-to-severe hot flashes.  Night sweats.  Decrease in sex drive.  Mood swings.  Headaches.  Tiredness.  Irritability.  Memory problems.  Insomnia. Choosing to treat or not to treat menopausal changes is an individual decision that you make with your health care provider. What should I know about hormone replacement therapy and supplements? Hormone therapy products are effective for treating symptoms that are associated with menopause, such as hot flashes and night sweats. Hormone replacement carries certain risks, especially as you become older. If you are thinking about using estrogen or estrogen with progestin treatments, discuss the benefits and risks with your health care provider. What should I know about heart disease and stroke? Heart disease, heart attack, and stroke become more likely as you age. This may be due, in part, to the hormonal changes that  your body experiences during menopause. These can affect how your body processes dietary fats, triglycerides, and cholesterol. Heart attack and stroke are both medical emergencies. There are many things that you can do to help prevent heart disease and stroke:  Have your blood pressure checked at least every 1-2 years. High blood pressure causes heart disease and increases the risk of stroke.  If you are 72-81 years old, ask your health care provider if you should take aspirin to prevent a heart attack or a stroke.  Do not use any tobacco products, including cigarettes, chewing tobacco, or electronic cigarettes. If you need help quitting, ask your health care provider.  It is important to eat a healthy diet and maintain a healthy weight.  Be sure to include plenty of vegetables, fruits, low-fat dairy products, and lean protein.  Avoid eating foods that are high in solid fats, added sugars, or salt (sodium).  Get regular exercise. This is one of the most important things that you can do for your health.  Try to exercise for at least 150 minutes each week. The type of exercise that you do should increase your heart rate and make you sweat. This is known as moderate-intensity exercise.  Try to do strengthening exercises at least twice each week. Do these in addition to the moderate-intensity exercise.  Know your numbers.Ask your health care provider to check your cholesterol and your blood glucose. Continue to have your blood tested as directed by your health care provider. What should I know about cancer screening? There are several types of cancer. Take the following steps to reduce your risk and to catch any cancer development as early as possible. Breast Cancer  Practice breast self-awareness.  This  means understanding how your breasts normally appear and feel.  It also means doing regular breast self-exams. Let your health care provider know about any changes, no matter how  small.  If you are 82 or older, have a clinician do a breast exam (clinical breast exam or CBE) every year. Depending on your age, family history, and medical history, it may be recommended that you also have a yearly breast X-ray (mammogram).  If you have a family history of breast cancer, talk with your health care provider about genetic screening.  If you are at high risk for breast cancer, talk with your health care provider about having an MRI and a mammogram every year.  Breast cancer (BRCA) gene test is recommended for women who have family members with BRCA-related cancers. Results of the assessment will determine the need for genetic counseling and BRCA1 and for BRCA2 testing. BRCA-related cancers include these types:  Breast. This occurs in males or females.  Ovarian.  Tubal. This may also be called fallopian tube cancer.  Cancer of the abdominal or pelvic lining (peritoneal cancer).  Prostate.  Pancreatic. Cervical, Uterine, and Ovarian Cancer  Your health care provider may recommend that you be screened regularly for cancer of the pelvic organs. These include your ovaries, uterus, and vagina. This screening involves a pelvic exam, which includes checking for microscopic changes to the surface of your cervix (Pap test).  For women ages 21-65, health care providers may recommend a pelvic exam and a Pap test every three years. For women ages 31-65, they may recommend the Pap test and pelvic exam, combined with testing for human papilloma virus (HPV), every five years. Some types of HPV increase your risk of cervical cancer. Testing for HPV may also be done on women of any age who have unclear Pap test results.  Other health care providers may not recommend any screening for nonpregnant women who are considered low risk for pelvic cancer and have no symptoms. Ask your health care provider if a screening pelvic exam is right for you.  If you have had past treatment for cervical  cancer or a condition that could lead to cancer, you need Pap tests and screening for cancer for at least 20 years after your treatment. If Pap tests have been discontinued for you, your risk factors (such as having a new sexual partner) need to be reassessed to determine if you should start having screenings again. Some women have medical problems that increase the chance of getting cervical cancer. In these cases, your health care provider may recommend that you have screening and Pap tests more often.  If you have a family history of uterine cancer or ovarian cancer, talk with your health care provider about genetic screening.  If you have vaginal bleeding after reaching menopause, tell your health care provider.  There are currently no reliable tests available to screen for ovarian cancer. Lung Cancer  Lung cancer screening is recommended for adults 81-57 years old who are at high risk for lung cancer because of a history of smoking. A yearly low-dose CT scan of the lungs is recommended if you:  Currently smoke.  Have a history of at least 30 pack-years of smoking and you currently smoke or have quit within the past 15 years. A pack-year is smoking an average of one pack of cigarettes per day for one year. Yearly screening should:  Continue until it has been 15 years since you quit.  Stop if you develop a health  problem that would prevent you from having lung cancer treatment. Colorectal Cancer  This type of cancer can be detected and can often be prevented.  Routine colorectal cancer screening usually begins at age 93 and continues through age 102.  If you have risk factors for colon cancer, your health care provider may recommend that you be screened at an earlier age.  If you have a family history of colorectal cancer, talk with your health care provider about genetic screening.  Your health care provider may also recommend using home test kits to check for hidden blood in your  stool.  A small camera at the end of a tube can be used to examine your colon directly (sigmoidoscopy or colonoscopy). This is done to check for the earliest forms of colorectal cancer.  Direct examination of the colon should be repeated every 5-10 years until age 23. However, if early forms of precancerous polyps or small growths are found or if you have a family history or genetic risk for colorectal cancer, you may need to be screened more often. Skin Cancer  Check your skin from head to toe regularly.  Monitor any moles. Be sure to tell your health care provider:  About any new moles or changes in moles, especially if there is a change in a mole's shape or color.  If you have a mole that is larger than the size of a pencil eraser.  If any of your family members has a history of skin cancer, especially at a young age, talk with your health care provider about genetic screening.  Always use sunscreen. Apply sunscreen liberally and repeatedly throughout the day.  Whenever you are outside, protect yourself by wearing long sleeves, pants, a wide-brimmed hat, and sunglasses. What should I know about osteoporosis? Osteoporosis is a condition in which bone destruction happens more quickly than new bone creation. After menopause, you may be at an increased risk for osteoporosis. To help prevent osteoporosis or the bone fractures that can happen because of osteoporosis, the following is recommended:  If you are 20-42 years old, get at least 1,000 mg of calcium and at least 600 mg of vitamin D per day.  If you are older than age 22 but younger than age 45, get at least 1,200 mg of calcium and at least 600 mg of vitamin D per day.  If you are older than age 51, get at least 1,200 mg of calcium and at least 800 mg of vitamin D per day. Smoking and excessive alcohol intake increase the risk of osteoporosis. Eat foods that are rich in calcium and vitamin D, and do weight-bearing exercises several  times each week as directed by your health care provider. What should I know about how menopause affects my mental health? Depression may occur at any age, but it is more common as you become older. Common symptoms of depression include:  Low or sad mood.  Changes in sleep patterns.  Changes in appetite or eating patterns.  Feeling an overall lack of motivation or enjoyment of activities that you previously enjoyed.  Frequent crying spells. Talk with your health care provider if you think that you are experiencing depression. What should I know about immunizations? It is important that you get and maintain your immunizations. These include:  Tetanus, diphtheria, and pertussis (Tdap) booster vaccine.  Influenza every year before the flu season begins.  Pneumonia vaccine.  Shingles vaccine. Your health care provider may also recommend other immunizations. This information is not  intended to replace advice given to you by your health care provider. Make sure you discuss any questions you have with your health care provider. Document Released: 03/23/2005 Document Revised: 08/19/2015 Document Reviewed: 11/02/2014  2017 Elsevier

## 2016-02-02 LAB — URINE CULTURE

## 2016-02-03 ENCOUNTER — Other Ambulatory Visit: Payer: Self-pay | Admitting: Internal Medicine

## 2016-02-03 ENCOUNTER — Other Ambulatory Visit: Payer: Self-pay | Admitting: Nurse Practitioner

## 2016-02-03 DIAGNOSIS — F329 Major depressive disorder, single episode, unspecified: Secondary | ICD-10-CM

## 2016-02-03 DIAGNOSIS — R059 Cough, unspecified: Secondary | ICD-10-CM

## 2016-02-03 DIAGNOSIS — F32A Depression, unspecified: Secondary | ICD-10-CM

## 2016-02-03 DIAGNOSIS — J4531 Mild persistent asthma with (acute) exacerbation: Secondary | ICD-10-CM

## 2016-02-03 DIAGNOSIS — E038 Other specified hypothyroidism: Secondary | ICD-10-CM

## 2016-02-03 DIAGNOSIS — R05 Cough: Secondary | ICD-10-CM

## 2016-02-03 MED ORDER — CETIRIZINE HCL 10 MG PO TABS: 10.0000 mg | ORAL_TABLET | Freq: Every day | ORAL | 5 refills | Status: DC

## 2016-02-07 DIAGNOSIS — M9901 Segmental and somatic dysfunction of cervical region: Secondary | ICD-10-CM | POA: Diagnosis not present

## 2016-02-07 DIAGNOSIS — R51 Headache: Secondary | ICD-10-CM | POA: Diagnosis not present

## 2016-02-07 DIAGNOSIS — M5010 Cervical disc disorder with radiculopathy, unspecified cervical region: Secondary | ICD-10-CM | POA: Diagnosis not present

## 2016-02-07 DIAGNOSIS — M9903 Segmental and somatic dysfunction of lumbar region: Secondary | ICD-10-CM | POA: Diagnosis not present

## 2016-02-16 DIAGNOSIS — Z1211 Encounter for screening for malignant neoplasm of colon: Secondary | ICD-10-CM | POA: Diagnosis not present

## 2016-02-23 DIAGNOSIS — M7551 Bursitis of right shoulder: Secondary | ICD-10-CM | POA: Diagnosis not present

## 2016-02-24 LAB — FECAL OCCULT BLOOD, IMMUNOCHEMICAL: Fecal Occult Bld: NEGATIVE

## 2016-03-05 DIAGNOSIS — G4733 Obstructive sleep apnea (adult) (pediatric): Secondary | ICD-10-CM | POA: Diagnosis not present

## 2016-03-06 DIAGNOSIS — G4733 Obstructive sleep apnea (adult) (pediatric): Secondary | ICD-10-CM | POA: Diagnosis not present

## 2016-03-07 DIAGNOSIS — R208 Other disturbances of skin sensation: Secondary | ICD-10-CM | POA: Diagnosis not present

## 2016-03-07 DIAGNOSIS — D1801 Hemangioma of skin and subcutaneous tissue: Secondary | ICD-10-CM | POA: Diagnosis not present

## 2016-03-07 DIAGNOSIS — D485 Neoplasm of uncertain behavior of skin: Secondary | ICD-10-CM | POA: Diagnosis not present

## 2016-03-08 DIAGNOSIS — I4891 Unspecified atrial fibrillation: Secondary | ICD-10-CM | POA: Diagnosis not present

## 2016-03-08 DIAGNOSIS — I10 Essential (primary) hypertension: Secondary | ICD-10-CM | POA: Diagnosis not present

## 2016-03-10 ENCOUNTER — Encounter: Payer: Self-pay | Admitting: Obstetrics and Gynecology

## 2016-03-12 ENCOUNTER — Ambulatory Visit (INDEPENDENT_AMBULATORY_CARE_PROVIDER_SITE_OTHER): Payer: PPO | Admitting: Obstetrics and Gynecology

## 2016-03-12 DIAGNOSIS — R300 Dysuria: Secondary | ICD-10-CM

## 2016-03-12 LAB — POCT URINALYSIS DIPSTICK
BILIRUBIN UA: 1
GLUCOSE UA: NEGATIVE
KETONES UA: NEGATIVE
NITRITE UA: NEGATIVE
PH UA: 6
Protein, UA: NEGATIVE
RBC UA: NEGATIVE
Spec Grav, UA: 1.025
Urobilinogen, UA: NEGATIVE

## 2016-03-12 MED ORDER — NITROFURANTOIN MONOHYD MACRO 100 MG PO CAPS
100.0000 mg | ORAL_CAPSULE | Freq: Two times a day (BID) | ORAL | 0 refills | Status: DC
Start: 2016-03-12 — End: 2016-03-29

## 2016-03-12 NOTE — Progress Notes (Signed)
Pt states Saturday she developed painful urination. Urgency and frequency. Now she has back pain and did see blood in her urine. U/a showed trace leuks only. Culture sent. Pt encouraged to increase h20. Cranberry juice. Tylenol as needed. Macrobid erx. Will contact pt with culture results.  I have reviewed the record and concur with patient management and plan. Navy Rothschild, Hassell Done, MD, Cherlynn June

## 2016-03-14 LAB — URINE CULTURE

## 2016-03-28 ENCOUNTER — Telehealth: Payer: Self-pay | Admitting: Pulmonary Disease

## 2016-03-28 MED ORDER — OSELTAMIVIR PHOSPHATE 75 MG PO CAPS: 75.0000 mg | ORAL_CAPSULE | Freq: Two times a day (BID) | ORAL | 0 refills | Status: DC

## 2016-03-28 NOTE — Telephone Encounter (Signed)
Spoke with pt, who c/o body aches, fever of 100.8 this morning, chills, non prod cough & wheezing X 2d Pt taken tylenol to help with the fever. Pt is requesting recommendations.  RA please advise.  Thanks.

## 2016-03-28 NOTE — Telephone Encounter (Signed)
Tamiflu 75 twice a day for 5 days Plenty of oral fluids Tylenol as needed for fever more than 100 Call back for antibiotic if sputum turns yellow or green

## 2016-03-28 NOTE — Telephone Encounter (Signed)
Spoke with pt. She is aware of RA's recommendations. Rx has been sent in. Nothing further was needed.

## 2016-03-29 ENCOUNTER — Ambulatory Visit (INDEPENDENT_AMBULATORY_CARE_PROVIDER_SITE_OTHER): Payer: PPO | Admitting: Family Medicine

## 2016-03-29 ENCOUNTER — Encounter: Payer: Self-pay | Admitting: Family Medicine

## 2016-03-29 ENCOUNTER — Emergency Department: Payer: PPO

## 2016-03-29 ENCOUNTER — Inpatient Hospital Stay
Admission: EM | Admit: 2016-03-29 | Discharge: 2016-04-03 | DRG: 189 | Disposition: A | Discharge status: Home / Self Care | Payer: PPO | Attending: Internal Medicine | Admitting: Internal Medicine

## 2016-03-29 ENCOUNTER — Encounter: Payer: Self-pay | Admitting: Emergency Medicine

## 2016-03-29 VITALS — BP 114/76 | HR 88 | Temp 98.8°F | Wt 220.0 lb

## 2016-03-29 DIAGNOSIS — Z833 Family history of diabetes mellitus: Secondary | ICD-10-CM

## 2016-03-29 DIAGNOSIS — J8410 Pulmonary fibrosis, unspecified: Secondary | ICD-10-CM | POA: Diagnosis not present

## 2016-03-29 DIAGNOSIS — J440 Chronic obstructive pulmonary disease with acute lower respiratory infection: Secondary | ICD-10-CM | POA: Diagnosis present

## 2016-03-29 DIAGNOSIS — R0602 Shortness of breath: Secondary | ICD-10-CM | POA: Diagnosis not present

## 2016-03-29 DIAGNOSIS — J9811 Atelectasis: Secondary | ICD-10-CM | POA: Diagnosis not present

## 2016-03-29 DIAGNOSIS — J209 Acute bronchitis, unspecified: Secondary | ICD-10-CM

## 2016-03-29 DIAGNOSIS — E119 Type 2 diabetes mellitus without complications: Secondary | ICD-10-CM | POA: Diagnosis not present

## 2016-03-29 DIAGNOSIS — K219 Gastro-esophageal reflux disease without esophagitis: Secondary | ICD-10-CM | POA: Diagnosis present

## 2016-03-29 DIAGNOSIS — E669 Obesity, unspecified: Secondary | ICD-10-CM | POA: Diagnosis not present

## 2016-03-29 DIAGNOSIS — J101 Influenza due to other identified influenza virus with other respiratory manifestations: Secondary | ICD-10-CM

## 2016-03-29 DIAGNOSIS — Z888 Allergy status to other drugs, medicaments and biological substances status: Secondary | ICD-10-CM

## 2016-03-29 DIAGNOSIS — I480 Paroxysmal atrial fibrillation: Secondary | ICD-10-CM | POA: Diagnosis not present

## 2016-03-29 DIAGNOSIS — G4733 Obstructive sleep apnea (adult) (pediatric): Secondary | ICD-10-CM | POA: Diagnosis present

## 2016-03-29 DIAGNOSIS — E8770 Fluid overload, unspecified: Secondary | ICD-10-CM | POA: Diagnosis not present

## 2016-03-29 DIAGNOSIS — Z882 Allergy status to sulfonamides status: Secondary | ICD-10-CM

## 2016-03-29 DIAGNOSIS — F411 Generalized anxiety disorder: Secondary | ICD-10-CM | POA: Diagnosis not present

## 2016-03-29 DIAGNOSIS — Z880 Allergy status to penicillin: Secondary | ICD-10-CM

## 2016-03-29 DIAGNOSIS — J441 Chronic obstructive pulmonary disease with (acute) exacerbation: Secondary | ICD-10-CM | POA: Diagnosis present

## 2016-03-29 DIAGNOSIS — K589 Irritable bowel syndrome without diarrhea: Secondary | ICD-10-CM | POA: Diagnosis present

## 2016-03-29 DIAGNOSIS — E781 Pure hyperglyceridemia: Secondary | ICD-10-CM | POA: Diagnosis present

## 2016-03-29 DIAGNOSIS — Z79899 Other long term (current) drug therapy: Secondary | ICD-10-CM

## 2016-03-29 DIAGNOSIS — Z87440 Personal history of urinary (tract) infections: Secondary | ICD-10-CM

## 2016-03-29 DIAGNOSIS — E039 Hypothyroidism, unspecified: Secondary | ICD-10-CM | POA: Diagnosis present

## 2016-03-29 DIAGNOSIS — Z9049 Acquired absence of other specified parts of digestive tract: Secondary | ICD-10-CM

## 2016-03-29 DIAGNOSIS — I10 Essential (primary) hypertension: Secondary | ICD-10-CM | POA: Diagnosis not present

## 2016-03-29 DIAGNOSIS — Z7984 Long term (current) use of oral hypoglycemic drugs: Secondary | ICD-10-CM

## 2016-03-29 DIAGNOSIS — J45901 Unspecified asthma with (acute) exacerbation: Secondary | ICD-10-CM

## 2016-03-29 DIAGNOSIS — A084 Viral intestinal infection, unspecified: Secondary | ICD-10-CM | POA: Diagnosis present

## 2016-03-29 DIAGNOSIS — Z881 Allergy status to other antibiotic agents status: Secondary | ICD-10-CM

## 2016-03-29 DIAGNOSIS — E785 Hyperlipidemia, unspecified: Secondary | ICD-10-CM | POA: Diagnosis present

## 2016-03-29 DIAGNOSIS — R0902 Hypoxemia: Secondary | ICD-10-CM

## 2016-03-29 DIAGNOSIS — J9601 Acute respiratory failure with hypoxia: Secondary | ICD-10-CM | POA: Diagnosis present

## 2016-03-29 DIAGNOSIS — Z8719 Personal history of other diseases of the digestive system: Secondary | ICD-10-CM

## 2016-03-29 DIAGNOSIS — R6889 Other general symptoms and signs: Secondary | ICD-10-CM | POA: Diagnosis not present

## 2016-03-29 DIAGNOSIS — Z6834 Body mass index (BMI) 34.0-34.9, adult: Secondary | ICD-10-CM | POA: Diagnosis not present

## 2016-03-29 DIAGNOSIS — Z807 Family history of other malignant neoplasms of lymphoid, hematopoietic and related tissues: Secondary | ICD-10-CM

## 2016-03-29 DIAGNOSIS — I482 Chronic atrial fibrillation: Secondary | ICD-10-CM | POA: Diagnosis present

## 2016-03-29 DIAGNOSIS — R69 Illness, unspecified: Secondary | ICD-10-CM | POA: Diagnosis not present

## 2016-03-29 DIAGNOSIS — F5105 Insomnia due to other mental disorder: Secondary | ICD-10-CM | POA: Diagnosis not present

## 2016-03-29 DIAGNOSIS — R197 Diarrhea, unspecified: Secondary | ICD-10-CM

## 2016-03-29 DIAGNOSIS — Z9989 Dependence on other enabling machines and devices: Secondary | ICD-10-CM

## 2016-03-29 DIAGNOSIS — Z7901 Long term (current) use of anticoagulants: Secondary | ICD-10-CM

## 2016-03-29 HISTORY — DX: Unspecified asthma, uncomplicated: J45.909

## 2016-03-29 LAB — CBC
HEMATOCRIT: 41.8 % (ref 35.0–47.0)
Hemoglobin: 14.4 g/dL (ref 12.0–16.0)
MCH: 30.6 pg (ref 26.0–34.0)
MCHC: 34.4 g/dL (ref 32.0–36.0)
MCV: 89 fL (ref 80.0–100.0)
PLATELETS: 293 10*3/uL (ref 150–440)
RBC: 4.7 MIL/uL (ref 3.80–5.20)
RDW: 14.6 % — AB (ref 11.5–14.5)
WBC: 7 10*3/uL (ref 3.6–11.0)

## 2016-03-29 LAB — COMPREHENSIVE METABOLIC PANEL
ALT: 33 U/L (ref 14–54)
AST: 28 U/L (ref 15–41)
Albumin: 4.1 g/dL (ref 3.5–5.0)
Alkaline Phosphatase: 62 U/L (ref 38–126)
Anion gap: 11 (ref 5–15)
BILIRUBIN TOTAL: 0.6 mg/dL (ref 0.3–1.2)
BUN: 11 mg/dL (ref 6–20)
CHLORIDE: 97 mmol/L — AB (ref 101–111)
CO2: 31 mmol/L (ref 22–32)
Calcium: 9.8 mg/dL (ref 8.9–10.3)
Creatinine, Ser: 0.58 mg/dL (ref 0.44–1.00)
GFR calc Af Amer: >60 mL/min (ref >60)
Glucose, Bld: 141 mg/dL — ABNORMAL HIGH (ref 65–99)
POTASSIUM: 3.5 mmol/L (ref 3.5–5.1)
Sodium: 139 mmol/L (ref 135–145)
TOTAL PROTEIN: 7.1 g/dL (ref 6.5–8.1)

## 2016-03-29 LAB — INFLUENZA PANEL BY PCR (TYPE A & B)
INFLAPCR: POSITIVE — AB
INFLBPCR: NEGATIVE

## 2016-03-29 LAB — GLUCOSE, CAPILLARY: Glucose-Capillary: 289 mg/dL — ABNORMAL HIGH (ref 65–99)

## 2016-03-29 MED ORDER — HYDROCODONE-ACETAMINOPHEN 5-325 MG PO TABS
1.0000 | ORAL_TABLET | ORAL | Status: DC | PRN
  Administered 2016-03-29 – 2016-03-30 (×2): 1 via ORAL
  Filled 2016-03-29 (×3): qty 1

## 2016-03-29 MED ORDER — ALBUTEROL SULFATE (2.5 MG/3ML) 0.083% IN NEBU
2.5000 mg | INHALATION_SOLUTION | Freq: Once | RESPIRATORY_TRACT | Status: AC
  Administered 2016-03-29: 2.5 mg via RESPIRATORY_TRACT

## 2016-03-29 MED ORDER — ALPRAZOLAM 0.25 MG PO TABS: 0.2500 mg | ORAL_TABLET | Freq: Two times a day (BID) | ORAL | Status: DC | PRN

## 2016-03-29 MED ORDER — OSELTAMIVIR PHOSPHATE 75 MG PO CAPS
75.0000 mg | ORAL_CAPSULE | Freq: Two times a day (BID) | ORAL | Status: DC
  Administered 2016-03-29 – 2016-03-30 (×2): 75 mg via ORAL
  Filled 2016-03-29 (×2): qty 1

## 2016-03-29 MED ORDER — SODIUM CHLORIDE 0.9 % IV SOLN
INTRAVENOUS | Status: DC
  Administered 2016-03-30: 01:00:00 via INTRAVENOUS

## 2016-03-29 MED ORDER — METHYLPREDNISOLONE SODIUM SUCC 125 MG IJ SOLR
125.0000 mg | Freq: Once | INTRAMUSCULAR | Status: AC
  Administered 2016-03-29: 125 mg via INTRAVENOUS
  Filled 2016-03-29 (×2): qty 2

## 2016-03-29 MED ORDER — PAROXETINE HCL ER 12.5 MG PO TB24
25.0000 mg | ORAL_TABLET | Freq: Every day | ORAL | Status: DC
  Administered 2016-03-30 – 2016-04-03 (×5): 25 mg via ORAL
  Filled 2016-03-29 (×7): qty 2

## 2016-03-29 MED ORDER — ONDANSETRON HCL 4 MG PO TABS
4.0000 mg | ORAL_TABLET | Freq: Four times a day (QID) | ORAL | Status: DC | PRN
  Administered 2016-03-30: 4 mg via ORAL
  Filled 2016-03-29 (×2): qty 1

## 2016-03-29 MED ORDER — METOPROLOL SUCCINATE ER 50 MG PO TB24
50.0000 mg | ORAL_TABLET | Freq: Every day | ORAL | Status: DC
  Administered 2016-03-30 – 2016-04-03 (×5): 50 mg via ORAL
  Filled 2016-03-29 (×5): qty 1

## 2016-03-29 MED ORDER — INSULIN ASPART 100 UNIT/ML ~~LOC~~ SOLN
0.0000 [IU] | Freq: Three times a day (TID) | SUBCUTANEOUS | Status: DC
  Administered 2016-03-30: 2 [IU] via SUBCUTANEOUS
  Administered 2016-03-30: 3 [IU] via SUBCUTANEOUS
  Administered 2016-03-30 – 2016-03-31 (×2): 5 [IU] via SUBCUTANEOUS
  Administered 2016-03-31 (×2): 3 [IU] via SUBCUTANEOUS
  Administered 2016-04-01: 2 [IU] via SUBCUTANEOUS
  Administered 2016-04-01 (×2): 3 [IU] via SUBCUTANEOUS
  Administered 2016-04-02: 1 [IU] via SUBCUTANEOUS
  Filled 2016-03-29: qty 1
  Filled 2016-03-29 (×2): qty 2
  Filled 2016-03-29 (×2): qty 3
  Filled 2016-03-29: qty 5
  Filled 2016-03-29 (×3): qty 3
  Filled 2016-03-29: qty 5

## 2016-03-29 MED ORDER — AMIODARONE HCL 200 MG PO TABS
200.0000 mg | ORAL_TABLET | Freq: Every day | ORAL | Status: DC
  Administered 2016-03-30 – 2016-04-03 (×5): 200 mg via ORAL
  Filled 2016-03-29 (×5): qty 1

## 2016-03-29 MED ORDER — INSULIN ASPART 100 UNIT/ML ~~LOC~~ SOLN
0.0000 [IU] | Freq: Every day | SUBCUTANEOUS | Status: DC
  Administered 2016-03-29: 3 [IU] via SUBCUTANEOUS
  Administered 2016-03-30: 2 [IU] via SUBCUTANEOUS
  Administered 2016-03-31: 3 [IU] via SUBCUTANEOUS
  Administered 2016-04-01: 2 [IU] via SUBCUTANEOUS
  Filled 2016-03-29 (×2): qty 3
  Filled 2016-03-29 (×2): qty 2

## 2016-03-29 MED ORDER — ONDANSETRON HCL 4 MG/2ML IJ SOLN
4.0000 mg | Freq: Four times a day (QID) | INTRAMUSCULAR | Status: DC | PRN
  Administered 2016-03-30 – 2016-03-31 (×3): 4 mg via INTRAVENOUS
  Filled 2016-03-29 (×3): qty 2

## 2016-03-29 MED ORDER — ACETAMINOPHEN 500 MG PO TABS
1000.0000 mg | ORAL_TABLET | Freq: Once | ORAL | Status: AC
  Administered 2016-03-29: 1000 mg via ORAL
  Filled 2016-03-29: qty 2

## 2016-03-29 MED ORDER — LEVOTHYROXINE SODIUM 88 MCG PO TABS
88.0000 ug | ORAL_TABLET | Freq: Every day | ORAL | Status: DC
  Administered 2016-03-30 – 2016-04-03 (×5): 88 ug via ORAL
  Filled 2016-03-29 (×5): qty 1

## 2016-03-29 MED ORDER — IPRATROPIUM-ALBUTEROL 0.5-2.5 (3) MG/3ML IN SOLN
3.0000 mL | RESPIRATORY_TRACT | Status: DC
  Administered 2016-03-30 (×6): 3 mL via RESPIRATORY_TRACT
  Filled 2016-03-29 (×5): qty 3

## 2016-03-29 MED ORDER — ZOLPIDEM TARTRATE 5 MG PO TABS
10.0000 mg | ORAL_TABLET | Freq: Every evening | ORAL | Status: DC | PRN
  Administered 2016-03-30 – 2016-04-02 (×4): 10 mg via ORAL
  Filled 2016-03-29 (×4): qty 2

## 2016-03-29 MED ORDER — ACETAMINOPHEN 325 MG PO TABS: 650.0000 mg | ORAL_TABLET | Freq: Four times a day (QID) | ORAL | Status: DC | PRN

## 2016-03-29 MED ORDER — AZITHROMYCIN 250 MG PO TABS
500.0000 mg | ORAL_TABLET | Freq: Every day | ORAL | Status: DC
  Administered 2016-03-29 – 2016-04-02 (×5): 500 mg via ORAL
  Filled 2016-03-29 (×5): qty 2

## 2016-03-29 MED ORDER — APIXABAN 5 MG PO TABS
5.0000 mg | ORAL_TABLET | Freq: Two times a day (BID) | ORAL | Status: DC
  Administered 2016-03-29 – 2016-04-03 (×10): 5 mg via ORAL
  Filled 2016-03-29 (×10): qty 1

## 2016-03-29 MED ORDER — METHYLPREDNISOLONE SODIUM SUCC 125 MG IJ SOLR
60.0000 mg | Freq: Two times a day (BID) | INTRAMUSCULAR | Status: DC
  Administered 2016-03-30 – 2016-03-31 (×5): 60 mg via INTRAVENOUS
  Filled 2016-03-29 (×5): qty 2

## 2016-03-29 MED ORDER — POTASSIUM CHLORIDE CRYS ER 20 MEQ PO TBCR
40.0000 meq | EXTENDED_RELEASE_TABLET | Freq: Once | ORAL | Status: AC
  Administered 2016-03-29: 40 meq via ORAL
  Filled 2016-03-29: qty 2

## 2016-03-29 MED ORDER — IPRATROPIUM-ALBUTEROL 0.5-2.5 (3) MG/3ML IN SOLN
3.0000 mL | Freq: Once | RESPIRATORY_TRACT | Status: AC
Start: 2016-03-29 — End: 2016-03-29
  Administered 2016-03-29: 3 mL via RESPIRATORY_TRACT
  Filled 2016-03-29: qty 3

## 2016-03-29 MED ORDER — METFORMIN HCL 500 MG PO TABS
1000.0000 mg | ORAL_TABLET | Freq: Two times a day (BID) | ORAL | Status: DC
  Administered 2016-03-30 – 2016-04-03 (×9): 1000 mg via ORAL
  Filled 2016-03-29 (×10): qty 2

## 2016-03-29 MED ORDER — IPRATROPIUM-ALBUTEROL 0.5-2.5 (3) MG/3ML IN SOLN
3.0000 mL | Freq: Once | RESPIRATORY_TRACT | Status: AC
  Administered 2016-03-29: 3 mL via RESPIRATORY_TRACT
  Filled 2016-03-29: qty 3

## 2016-03-29 MED ORDER — ALBUTEROL SULFATE (2.5 MG/3ML) 0.083% IN NEBU
5.0000 mg | INHALATION_SOLUTION | Freq: Once | RESPIRATORY_TRACT | Status: AC
  Administered 2016-03-29: 5 mg via RESPIRATORY_TRACT
  Filled 2016-03-29: qty 6

## 2016-03-29 MED ORDER — SODIUM CHLORIDE 0.9 % IV BOLUS (SEPSIS)
500.0000 mL | Freq: Once | INTRAVENOUS | Status: AC
  Administered 2016-03-29: 500 mL via INTRAVENOUS

## 2016-03-29 MED ORDER — LORATADINE 10 MG PO TABS
10.0000 mg | ORAL_TABLET | Freq: Every day | ORAL | Status: DC
  Administered 2016-03-30 – 2016-04-03 (×5): 10 mg via ORAL
  Filled 2016-03-29 (×5): qty 1

## 2016-03-29 MED ORDER — ACETAMINOPHEN 650 MG RE SUPP: 650.0000 mg | Freq: Four times a day (QID) | RECTAL | Status: DC | PRN

## 2016-03-29 NOTE — ED Notes (Addendum)
Patient ambulated while checking her SpO2.  SpO2 dropped to 89% room air with ambulation.  Once patient was returned to room sats stayed at 90% room air.  Patient placed back on 2 L [place] and MD informed.

## 2016-03-29 NOTE — H&P (Signed)
[facility] [place] [name]    MR#:  [id number]  DATE OF BIRTH:  Apr 23, 1955  DATE OF ADMISSION:  03/29/2016  PRIMARY CARE PHYSICIAN: [name], MD   REQUESTING/REFERRING PHYSICIAN: Dr. Nance Pear  CHIEF COMPLAINT:   Chief Complaint  Patient presents with  . Cough  . Wheezing    HISTORY OF PRESENT ILLNESS:  Kirsten [name]  is a 61 y.o. female with a known history of Chronic atrial fibrillation on eliquis, sleep apnea on CPAP, depression and anxiety, diabetes mellitus-non-insulin-dependent presents to hospital secondary to worsening shortness of breath, fevers and chills and myalgias. Symptoms started about 5 days ago with fevers and chills. Denies any nausea or vomiting. No recent travel or exposure to sick contacts. For the last 2 days her breathing has gotten worse, was able to hear audible wheezing, dry cough and also low-grade fevers. Called her pulmonary doctors yesterday who advised to start Tamiflu. Patient hadn't had a chance to fill that medication. Went to see her primary care physician, but because she was hypoxic in the office, she was sent to the emergency room. In the ER saturations were 88-89% on room air, tachypneic with exertion. Placed on 2 L nasal cannula and is being admitted. Chest x-ray with no acute findings.  PAST MEDICAL HISTORY:   Past Medical History:  Diagnosis Date  . A-fib ([place])   . Anxiety disorder   . Gastroschisis    umphalocele, rotated organs abd repair until age 53  . GERD (gastroesophageal reflux disease)   . HTN (hypertension)   . Hyperlipidemia   . IBS (irritable bowel syndrome)   . Morbid obesity ([place])    Target wt - 185  for BMI < 30  . Obesity   . Pulmonary fibrosis ([place])   . SBO (small bowel obstruction)    Resolved with NG/Bowel rest around 2009  . Sleep apnea   . Type II or unspecified type diabetes mellitus without mention of complication, not stated as  uncontrolled     PAST SURGICAL HISTORY:   Past Surgical History:  Procedure Laterality Date  . BREAST BIOPSY Left   . CESAREAN SECTION    . CHOLECYSTECTOMY  1992  . Newborn Surgery - GI - ORGANS OUTSIDE ABDOMEN    . Small Bowel Repair    . TUBAL LIGATION  1988    SOCIAL HISTORY:   Social History  Substance Use Topics  . Smoking status: Never Smoker  . Smokeless tobacco: Never Used     Comment: "tried as a teen"  . Alcohol use 1.2 oz/week    2 Glasses of wine per week     Comment: rare    FAMILY HISTORY:   Family History  Problem Relation Age of Onset  . Lymphoma Mother   . Diabetes type II Father   . Diabetes type I Son   . Diabetes Maternal Grandmother   . Goiter Neg Hx   . Colon cancer Neg Hx   . Breast cancer Neg Hx     DRUG ALLERGIES:   Allergies  Allergen Reactions  . Factive [Gemifloxacin Mesylate] Rash  . Crestor [Rosuvastatin]     GI upset  . Sulfonamide Derivatives     REACTION: rash  . Gemifloxacin Rash  . Penicillins Hives and Rash    Has patient had a PCN reaction causing immediate rash, facial/tongue/throat swelling, SOB or lightheadedness with hypotension: No Has patient had a PCN reaction causing severe rash  involving mucus membranes or skin necrosis: No Has patient had a PCN reaction that required hospitalization No Has patient had a PCN reaction occurring within the last 10 years: No If all of the above answers are "NO", then may proceed with Cephalosporin use.     REVIEW OF SYSTEMS:   Review of Systems  Constitutional: Positive for chills, fever and malaise/fatigue. Negative for weight loss.  HENT: Negative for ear discharge, ear pain, hearing loss and nosebleeds.   Eyes: Negative for blurred vision, double vision and photophobia.  Respiratory: Positive for cough, shortness of breath and wheezing. Negative for hemoptysis.   Cardiovascular: Negative for chest pain, palpitations, orthopnea and leg swelling.  Gastrointestinal: Positive  for diarrhea. Negative for abdominal pain, constipation, melena, nausea and vomiting.  Genitourinary: Negative for dysuria.  Musculoskeletal: Positive for back pain. Negative for myalgias and neck pain.  Skin: Negative for rash.  Neurological: Negative for dizziness, sensory change, speech change, focal weakness and headaches.  Endo/Heme/Allergies: Does not bruise/bleed easily.  Psychiatric/Behavioral: Negative for depression.    MEDICATIONS AT HOME:   Prior to Admission medications   Medication Sig Start Date End Date Taking? Authorizing Provider  albuterol (PROVENTIL HFA;VENTOLIN HFA) 108 (90 Base) MCG/ACT inhaler Inhale 2 puffs into the lungs every 6 (six) hours as needed for wheezing or shortness of breath. 01/02/16  Yes Flossie Buffy, NP  ALPRAZolam Duanne Moron) 0.25 MG tablet take 1 tablet by mouth once daily if needed 07/18/15  Yes [name], MD  amiodarone (PACERONE) 200 MG tablet Take 200 mg by mouth daily.   Yes Historical Provider, MD  apixaban (ELIQUIS) 5 MG TABS tablet Take 5 mg by mouth 2 (two) times daily.   Yes Historical Provider, MD  cetirizine (ZYRTEC) 10 MG tablet Take 1 tablet (10 mg total) by mouth daily. 02/03/16  Yes [name], MD  hydrochlorothiazide (HYDRODIURIL) 25 MG tablet Take 1 tablet (25 mg total) by mouth daily. 02/15/15  Yes [name], MD  metFORMIN (GLUCOPHAGE) 1000 MG tablet take 1 tablet by mouth twice a day with A MEAL 01/10/16  Yes [name], MD  metoprolol succinate (TOPROL-XL) 50 MG 24 hr tablet Take 50 mg by mouth daily.  12/12/15  Yes Historical Provider, MD  PARoxetine (PAXIL-CR) 25 MG 24 hr tablet take 1 tablet by mouth once daily 02/03/16  Yes [name], MD  SYNTHROID 88 MCG tablet take 1 tablet by mouth once daily BEFORE BREAKFAST 02/03/16  Yes [name], MD  zolpidem (AMBIEN) 10 MG tablet take 1 tablet by mouth at bedtime if needed 07/18/15  Yes [name], MD  glucose blood (ONE TOUCH ULTRA TEST) test strip Use as  instructed 06/03/13   Biagio Borg, MD  oseltamivir (TAMIFLU) 75 MG capsule Take 1 capsule (75 mg total) by mouth 2 (two) times daily. Patient not taking: Reported on 03/29/2016 03/28/16   Rigoberto Noel, MD      VITAL SIGNS:  Blood pressure (!) 112/59, pulse 78, temperature 97.9 F (36.6 C), temperature source Oral, resp. rate 16, SpO2 94 %.  PHYSICAL EXAMINATION:   Physical Exam  GENERAL:  61 y.o.-year-old obese patient lying in the bed with no acute distress.  EYES: Pupils equal, round, reactive to light and accommodation. No scleral icterus. Extraocular muscles intact.  HEENT: Head atraumatic, normocephalic. Oropharynx and nasopharynx clear.  NECK:  Supple, no jugular venous distention. No thyroid enlargement, no tenderness.  LUNGS: diffuse scattered expiratory wheezing bilaterally, coarse bibasilar  rhonchi, no rales or crepitation. No use of accessory muscles of respiration.  CARDIOVASCULAR: S1, S2 normal. No murmurs, rubs, or gallops.  ABDOMEN: Soft, nontender, nondistended. Bowel sounds present. No organomegaly or mass.  EXTREMITIES: No pedal edema, cyanosis, or clubbing.  NEUROLOGIC: Cranial nerves II through XII are intact. Muscle strength 5/5 in all extremities. Sensation intact. Gait not checked.  PSYCHIATRIC: The patient is alert and oriented x 3.  SKIN: No obvious rash, lesion, or ulcer.   LABORATORY PANEL:   CBC  Recent Labs Lab 03/29/16 1636  WBC 7.0  HGB 14.4  HCT 41.8  PLT 293   ------------------------------------------------------------------------------------------------------------------  Chemistries   Recent Labs Lab 03/29/16 1636  NA 139  K 3.5  CL 97*  CO2 31  GLUCOSE 141*  BUN 11  CREATININE 0.58  CALCIUM 9.8  AST 28  ALT 33  ALKPHOS 62  BILITOT 0.6   ------------------------------------------------------------------------------------------------------------------  Cardiac Enzymes No results for input(s): TROPONINI in the last 168  hours. ------------------------------------------------------------------------------------------------------------------  RADIOLOGY:  Dg Chest 2 View  Result Date: 03/29/2016 CLINICAL DATA:  Dysnea EXAM: CHEST  2 VIEW COMPARISON:  01/02/2016, 10/07/2015 FINDINGS: The heart size and mediastinal contours stable in appearance. No aortic aneurysm. Borderline cardiomegaly. Trace edema along the right major fissure. Subsegmental atelectasis at the left lung base. Tiny 2 mm nodular density overlying the right posterior fifth rib is new since prior exam and may reflect a pulmonary vessel on end or potentially tiny nodule. No overt pulmonary edema. Eventration of the right hemidiaphragm is stable. The visualized skeletal structures are unremarkable. IMPRESSION: No active cardiopulmonary disease. Platelike atelectasis at the left lung base. Tiny nodular density overlying the right upper lobe may reflect pulmonary vessel on end pulmonary nodule or summation shadow. It is unchanged in appearance when compared with recent comparison. Electronically Signed   By: Ashley Royalty M.D.   On: 03/29/2016 17:49    EKG:   Orders placed or performed during the hospital encounter of 03/29/16  . EKG 12-Lead  . EKG 12-Lead  . ED EKG  . ED EKG    IMPRESSION AND PLAN:   Kirsten [name]  is a 61 y.o. female with a known history of Chronic atrial fibrillation on eliquis, sleep apnea on CPAP, depression and anxiety, diabetes mellitus-non-insulin-dependent presents to hospital secondary to worsening shortness of breath, fevers and chills and myalgias.  #1 acute hypoxic respiratory failure-secondary to hypoxia and tachypnea on exertion on admission. -Secondary to influenza-like illness, reactive airway disease with bronchitis. -Flu PCR is pending at this time. Started on Tamiflu. Droplet precautions -Added Z-Pak. Also started on duo nebs and steroids. -Patient does not have history of asthma or COPD.  -continue oxygen  support as needed at this time.  #2 diabetes mellitus-check A1c. On metformin. Sliding scale insulin added.  #3 chronic atrial fibrillation-rate is controlled at this time. Patient on amiodarone and metoprolol. -On eliquis for anticoagulation.  #4 diarrhea-could be acute viral gastroenteritis. Stool for C. difficile and GI panel sent for. -Until then placed on contact precautions.  #5 hypothyroidism-continue Synthroid  #6 DVT prophylaxis - already on eliquis.    All the records are reviewed and case discussed with ED provider. Management plans discussed with the patient, family and they are in agreement.  CODE STATUS: Full code  TOTAL TIME TAKING CARE OF THIS PATIENT: 50 minutes.    Gladstone Lighter M.D on 03/29/2016 at 9:06 PM  Between 7am to 6pm - Pager - [phone]  After 6pm go to [email or web address] - password  [email or web address]  [facility]  Office  [phone]  CC: Primary care physician; [name], MD

## 2016-03-29 NOTE — Patient Instructions (Signed)
Go to the ER at [facility].  We'll call ahead.  Take care.  Glad to see you.

## 2016-03-29 NOTE — Progress Notes (Signed)
Sx started about 4-5 days ago.  Chills, aches, fevers, cough.  Initial sx started quickly.  No vomiting, but some diarrhea.  Noted stomach gurgling.  Wheeze noted, worse yesterday.  No sputum.  Still with some aches.  Feels feverish and diffusely weak.  She feels worse today than yesterday.  SOB worse laying down than with exertion.    Using SABA w/o a lot of effect.  Has rx for tamiflu but hasn't started yet.    Meds, vitals, and allergies reviewed.   ROS: Per HPI unless specifically indicated in ROS section   GEN: nad, alert and oriented HEENT: mucous membranes moist, tm w/o erythema, nasal exam w/o erythema, clear discharge noted,  OP with cobblestoning NECK: supple w/o LA CV: rrr.   PULM: global dec in BS, no sig change with albuterol neb, cough noted.  EXT: no edema SKIN: no acute rash Recheck pulse ox 94%.

## 2016-03-29 NOTE — Progress Notes (Signed)
Pre visit review using our clinic review tool, if applicable. No additional management support is needed unless otherwise documented below in the visit note. 

## 2016-03-29 NOTE — ED Provider Notes (Signed)
[facility] Emergency Department Provider Note   ____________________________________________   I have reviewed the triage vital signs and the nursing notes.   HISTORY  Chief Complaint Cough and Wheezing   History limited by: Not Limited   HPI Kirsten [name] is a 61 y.o. female who presents to the emergency department today from primary care doctor's office because of concerns for wheezing and shortness of breath. Patient states she has had a flulike illness for the past few days. She has a history of chronic bronchitis and has an inhaler at home. She has been trying the past couple of days without any significant relief. The patient states that she has had some associated pain across her back. She denies any cough.   Past Medical History:  Diagnosis Date  . A-fib ([place])   . Anxiety disorder   . Gastroschisis    umphalocele, rotated organs abd repair until age 5  . GERD (gastroesophageal reflux disease)   . HTN (hypertension)   . Hyperlipidemia   . IBS (irritable bowel syndrome)   . Morbid obesity ([place])    Target wt - 185  for BMI < 30  . Obesity   . Pulmonary fibrosis ([place])   . SBO (small bowel obstruction)    Resolved with NG/Bowel rest around 2009  . Sleep apnea   . Type II or unspecified type diabetes mellitus without mention of complication, not stated as uncontrolled     Patient Active Problem List   Diagnosis Date Noted  . Flu-like symptoms 03/29/2016  . Class 2 obesity due to excess calories with body mass index (BMI) of 35.0 to 35.9 in adult 01/31/2016  . History of recurrent UTIs 01/31/2016  . Enterococcus UTI 07/21/2015  . Vaginal atrophy 01/13/2015  . Vaginal stenosis 01/13/2015  . Insomnia due to anxiety and fear 10/11/2014  . GAD (generalized anxiety disorder) 10/11/2014  . Paroxysmal atrial fibrillation ([place]) 04/08/2014  . Hyperlipidemia with target LDL less than 100 10/22/2012  . Chronic arthralgias of knees and hips  07/09/2012  . Asthma, mild persistent 04/06/2011  . Hypertriglyceridemia 01/29/2011  . UTI 07/28/2009  . Vitamin B 12 deficiency 06/28/2009  . Hypothyroidism 06/20/2009  . Depression with anxiety 06/20/2009  . Type II diabetes mellitus with manifestations ([place]) 04/08/2009  . Essential hypertension, benign 04/08/2009  . OSA (obstructive sleep apnea) 04/08/2009    Past Surgical History:  Procedure Laterality Date  . BREAST BIOPSY Left   . CHOLECYSTECTOMY  1992  . Newborn Surgery - GI - ORGANS OUTSIDE ABDOMEN    . Small Bowel Repair    . TUBAL LIGATION  1988    Prior to Admission medications   Medication Sig Start Date End Date Taking? Authorizing Provider  albuterol (PROVENTIL HFA;VENTOLIN HFA) 108 (90 Base) MCG/ACT inhaler Inhale 2 puffs into the lungs every 6 (six) hours as needed for wheezing or shortness of breath. 01/02/16   Flossie Buffy, NP  ALPRAZolam Duanne Moron) 0.25 MG tablet take 1 tablet by mouth once daily if needed 07/18/15   [name], MD  amiodarone (PACERONE) 200 MG tablet Take 200 mg by mouth daily.    Historical Provider, MD  apixaban (ELIQUIS) 5 MG TABS tablet Take 5 mg by mouth 2 (two) times daily.    Historical Provider, MD  cetirizine (ZYRTEC) 10 MG tablet Take 1 tablet (10 mg total) by mouth daily. 02/03/16   [name], MD  glucose blood (ONE TOUCH ULTRA TEST) test strip Use as instructed  06/03/13   Biagio Borg, MD  hydrochlorothiazide (HYDRODIURIL) 25 MG tablet Take 1 tablet (25 mg total) by mouth daily. 02/15/15   [name], MD  metFORMIN (GLUCOPHAGE) 1000 MG tablet take 1 tablet by mouth twice a day with A MEAL 01/10/16   [name], MD  metoprolol succinate (TOPROL-XL) 50 MG 24 hr tablet  12/12/15   Historical Provider, MD  oseltamivir (TAMIFLU) 75 MG capsule Take 1 capsule (75 mg total) by mouth 2 (two) times daily. Patient not taking: Reported on 03/29/2016 03/28/16   Rigoberto Noel, MD  PARoxetine (PAXIL-CR) 25 MG 24 hr tablet take 1 tablet  by mouth once daily 02/03/16   [name], MD  SYNTHROID 88 MCG tablet take 1 tablet by mouth once daily BEFORE BREAKFAST 02/03/16   [name], MD  zolpidem Lorrin Mais) 10 MG tablet take 1 tablet by mouth at bedtime if needed 07/18/15   [name], MD    Allergies Factive [gemifloxacin mesylate]; Crestor [rosuvastatin]; Penicillins; Sulfonamide derivatives; and Gemifloxacin  Family History  Problem Relation Age of Onset  . Lymphoma Mother   . Diabetes type II Father   . Diabetes type I Son   . Diabetes Maternal Grandmother   . Goiter Neg Hx   . Colon cancer Neg Hx   . Breast cancer Neg Hx     Social History Social History  Substance Use Topics  . Smoking status: Never Smoker  . Smokeless tobacco: Never Used     Comment: "tried as a teen"  . Alcohol use 1.2 oz/week    2 Glasses of wine per week     Comment: rare    Review of Systems  Constitutional: Negative for fever. Cardiovascular: Negative for chest pain. Respiratory: Positive for shortness of breath. Gastrointestinal: Negative for abdominal pain, vomiting and diarrhea. Genitourinary: Negative for dysuria. Musculoskeletal: Positive for back pain. Neurological: Negative for headaches, focal weakness or numbness.  10-point ROS otherwise negative.  ____________________________________________   PHYSICAL EXAM:  VITAL SIGNS: ED Triage Vitals  Enc Vitals Group     BP 03/29/16 1633 137/65     Pulse Rate 03/29/16 1633 88     Resp 03/29/16 1633 (!) 22     Temp 03/29/16 1633 98.9 F (37.2 C)     Temp Source 03/29/16 1633 Oral     SpO2 03/29/16 1633 95 %     Weight --      Height --      Head Circumference --      Peak Flow --      Pain Score 03/29/16 1629 4   Constitutional: Alert and oriented. Well appearing and in no distress. Eyes: Conjunctivae are normal. Normal extraocular movements. ENT   Head: Normocephalic and atraumatic.   Nose: No congestion/rhinnorhea.   Mouth/Throat: Mucous  membranes are moist.   Neck: No stridor. Hematological/Lymphatic/Immunilogical: No cervical lymphadenopathy. Cardiovascular: Normal rate, regular rhythm.  No murmurs, rubs, or gallops.  Respiratory: Slightly increased respiratory effort. Bilateral diffuse expiratory wheezing. Gastrointestinal: Soft and non tender. No rebound. No guarding.  Genitourinary: Deferred Musculoskeletal: Normal range of motion in all extremities. No lower extremity edema. Neurologic:  Normal speech and language. No gross focal neurologic deficits are appreciated.  Skin:  Skin is warm, dry and intact. No rash noted. Psychiatric: Mood and affect are normal. Speech and behavior are normal. Patient exhibits appropriate insight and judgment.  ____________________________________________    LABS (pertinent positives/negatives)  Labs Reviewed  CBC - Abnormal; Notable  for the following:       Result Value   RDW 14.6 (*)    All other components within normal limits  COMPREHENSIVE METABOLIC PANEL - Abnormal; Notable for the following:    Chloride 97 (*)    Glucose, Bld 141 (*)    All other components within normal limits     ____________________________________________   EKG  I, Nance Pear, attending physician, personally viewed and interpreted this EKG  EKG Time: 1632 Rate: 89 Rhythm: normal sinus rhythm Axis: left axis deviation Intervals: qtc 513 QRS: LVH ST changes: no st elevation Impression: abnormal ekg   ____________________________________________    RADIOLOGY  CXR IMPRESSION:  No active cardiopulmonary disease. Platelike atelectasis at the left  lung base. Tiny nodular density overlying the right upper lobe may  reflect pulmonary vessel on end pulmonary nodule or summation  shadow. It is unchanged in appearance when compared with recent  comparison.       ____________________________________________   PROCEDURES  Procedures  ____________________________________________   INITIAL IMPRESSION / ASSESSMENT AND PLAN / ED COURSE  Pertinent labs & imaging results that were available during my care of the patient were reviewed by me and considered in my medical decision making (see chart for details).  Patient presented to the emergency department today because of concerns for shortness of breath from primary care doctor's office. Patient was given Solu-Medrol multiple DuoNeb treatments here in the emergency department. We did try her off of oxygen however she continued to desat less than 90 while ambulating. Because of this patient will be admitted to the hospital service.  ____________________________________________   FINAL CLINICAL IMPRESSION(S) / ED DIAGNOSES  Final diagnoses:  Exacerbation of asthma, unspecified asthma severity, unspecified whether persistent     Note: This dictation was prepared with Dragon dictation. Any transcriptional errors that result from this process are unintentional     Nance Pear, MD 03/29/16 2157

## 2016-03-29 NOTE — ED Triage Notes (Signed)
Pt with coughing, wheezing, fever and body aches for a few days. Pt was sent over per pcp with low o2 sats.  Pt was 88%-91% on ra. Pt placed on 2 liters with oxygen at 95%.

## 2016-03-29 NOTE — Assessment & Plan Note (Addendum)
Presumed, likely flu.  D/w pt.  No sig change with albuterol neb.  I offered/encouraged EMS transport, she wanted her daughter to drive her.  This is per pt preference, not my rec.  D/w pt.  She thinks she is worsening and I think she would fail outpatient tx at this point.  D/w pt.  Pt agrees.    Late entry. Charge nurse notified at [facility] emergency room about pending arrival. Patient was rechecked multiple times during the office visit. >25 minutes spent in face to face time with patient, >50% spent in counselling or coordination of care.

## 2016-03-30 LAB — GASTROINTESTINAL PANEL BY PCR, STOOL (REPLACES STOOL CULTURE)
ADENOVIRUS F40/41: NOT DETECTED
ASTROVIRUS: NOT DETECTED
CAMPYLOBACTER SPECIES: NOT DETECTED
CYCLOSPORA CAYETANENSIS: NOT DETECTED
Cryptosporidium: NOT DETECTED
ENTAMOEBA HISTOLYTICA: NOT DETECTED
ENTEROPATHOGENIC E COLI (EPEC): NOT DETECTED
ENTEROTOXIGENIC E COLI (ETEC): NOT DETECTED
Enteroaggregative E coli (EAEC): NOT DETECTED
Giardia lamblia: NOT DETECTED
NOROVIRUS GI/GII: NOT DETECTED
PLESIMONAS SHIGELLOIDES: NOT DETECTED
Rotavirus A: NOT DETECTED
SHIGA LIKE TOXIN PRODUCING E COLI (STEC): NOT DETECTED
Salmonella species: NOT DETECTED
Sapovirus (I, II, IV, and V): NOT DETECTED
Shigella/Enteroinvasive E coli (EIEC): NOT DETECTED
VIBRIO CHOLERAE: NOT DETECTED
VIBRIO SPECIES: NOT DETECTED
Yersinia enterocolitica: NOT DETECTED

## 2016-03-30 LAB — CBC
HCT: 38.9 % (ref 35.0–47.0)
Hemoglobin: 12.9 g/dL (ref 12.0–16.0)
MCH: 29.7 pg (ref 26.0–34.0)
MCHC: 33 g/dL (ref 32.0–36.0)
MCV: 89.9 fL (ref 80.0–100.0)
PLATELETS: 216 10*3/uL (ref 150–440)
RBC: 4.33 MIL/uL (ref 3.80–5.20)
RDW: 14.3 % (ref 11.5–14.5)
WBC: 3.4 10*3/uL — ABNORMAL LOW (ref 3.6–11.0)

## 2016-03-30 LAB — BASIC METABOLIC PANEL
Anion gap: 10 (ref 5–15)
BUN: 13 mg/dL (ref 6–20)
CALCIUM: 9.2 mg/dL (ref 8.9–10.3)
CHLORIDE: 102 mmol/L (ref 101–111)
CO2: 29 mmol/L (ref 22–32)
CREATININE: 0.58 mg/dL (ref 0.44–1.00)
GFR calc Af Amer: >60 mL/min (ref >60)
GFR calc non Af Amer: >60 mL/min (ref >60)
Glucose, Bld: 253 mg/dL — ABNORMAL HIGH (ref 65–99)
Potassium: 3.8 mmol/L (ref 3.5–5.1)
Sodium: 141 mmol/L (ref 135–145)

## 2016-03-30 LAB — GLUCOSE, CAPILLARY
GLUCOSE-CAPILLARY: 224 mg/dL — AB (ref 65–99)
Glucose-Capillary: 251 mg/dL — ABNORMAL HIGH (ref 65–99)
Glucose-Capillary: 198 mg/dL — ABNORMAL HIGH (ref 65–99)
Glucose-Capillary: 235 mg/dL — ABNORMAL HIGH (ref 65–99)

## 2016-03-30 LAB — C DIFFICILE QUICK SCREEN W PCR REFLEX
C DIFFICILE (CDIFF) TOXIN: NEGATIVE
C DIFFICLE (CDIFF) ANTIGEN: NEGATIVE
C Diff interpretation: NOT DETECTED

## 2016-03-30 MED ORDER — PROMETHAZINE HCL 25 MG/ML IJ SOLN
12.5000 mg | Freq: Four times a day (QID) | INTRAMUSCULAR | Status: DC | PRN
  Administered 2016-03-30 (×2): 12.5 mg via INTRAVENOUS
  Filled 2016-03-30 (×2): qty 1

## 2016-03-30 MED ORDER — IPRATROPIUM-ALBUTEROL 0.5-2.5 (3) MG/3ML IN SOLN
3.0000 mL | Freq: Four times a day (QID) | RESPIRATORY_TRACT | Status: DC
  Administered 2016-03-31 (×2): 3 mL via RESPIRATORY_TRACT
  Filled 2016-03-30 (×2): qty 3

## 2016-03-30 MED ORDER — PROMETHAZINE HCL 25 MG/ML IJ SOLN
12.5000 mg | Freq: Once | INTRAMUSCULAR | Status: AC
  Administered 2016-03-30: 12.5 mg via INTRAVENOUS
  Filled 2016-03-30: qty 1

## 2016-03-30 MED ORDER — GUAIFENESIN-DM 100-10 MG/5ML PO SYRP
5.0000 mL | ORAL_SOLUTION | ORAL | Status: DC | PRN
  Filled 2016-03-30: qty 5

## 2016-03-30 MED ORDER — GUAIFENESIN ER 600 MG PO TB12
600.0000 mg | ORAL_TABLET | Freq: Two times a day (BID) | ORAL | Status: DC
  Administered 2016-03-30 – 2016-04-03 (×9): 600 mg via ORAL
  Filled 2016-03-30 (×9): qty 1

## 2016-03-30 NOTE — Progress Notes (Signed)
Pt placed on [facility] CPAP C-4. CPAP plugged into red outlet. 2L O2 in line.

## 2016-03-30 NOTE — Progress Notes (Signed)
[place] at [facility]                                                                                                                                                                                  Patient Demographics   Kirsten [name], is a 61 y.o. female, DOB - Feb 26, 1955, WUJ:[id number]  Admit date - 03/29/2016   Admitting Physician Gladstone Lighter, MD  Outpatient Primary MD for the patient is [name], MD   LOS - 1  Subjective: Patient presented with shortness of breath and hypoxia. Continues to have oxygen sats dropping. She'll complaining of some nausea vomiting and diarrhea   Review of Systems:   CONSTITUTIONAL: No documented fever. No fatigue, weakness. No weight gain, no weight loss.  EYES: No blurry or double vision.  ENT: No tinnitus. No postnasal drip. No redness of the oropharynx.  RESPIRATORY:Positive cough, no wheeze, no hemoptysis. Positive dyspnea.  CARDIOVASCULAR: No chest pain. No orthopnea. No palpitations. No syncope.  GASTROINTESTINAL: postive nausea,  postive vomiting or diarrhea. No abdominal pain. No melena or hematochezia.  GENITOURINARY: No dysuria or hematuria.  ENDOCRINE: No polyuria or nocturia. No heat or cold intolerance.  HEMATOLOGY: No anemia. No bruising. No bleeding.  INTEGUMENTARY: No rashes. No lesions.  MUSCULOSKELETAL: No arthritis. No swelling. No gout.  NEUROLOGIC: No numbness, tingling, or ataxia. No seizure-type activity.  PSYCHIATRIC: No anxiety. No insomnia. No ADD.    Vitals:   Vitals:   03/30/16 0806 03/30/16 1148 03/30/16 1154 03/30/16 1155  BP: 128/64     Pulse: 81 75 95   Resp:      Temp: 97.9 F (36.6 C)     TempSrc: Oral     SpO2: 93% 96% (!) 85% 94%  Weight:      Height:        Wt Readings from Last 3 Encounters:  03/29/16 221 lb 1.6 oz (100.3 kg)  03/29/16 220 lb (99.8 kg)  01/31/16 221 lb 8 oz (100.5 kg)     Intake/Output Summary (Last 24 hours) at 03/30/16 1445 Last  data filed at 03/30/16 0450  Gross per 24 hour  Intake              268 ml  Output                0 ml  Net              268 ml    Physical Exam:   GENERAL: Pleasant-appearing in no apparent distress.  HEAD, EYES, EARS, NOSE AND THROAT: Atraumatic, normocephalic. Extraocular muscles are intact. Pupils equal and reactive to light. Sclerae anicteric. No conjunctival injection. No oro-pharyngeal  erythema.  NECK: Supple. There is no jugular venous distention. No bruits, no lymphadenopathy, no thyromegaly.  HEART: Regular rate and rhythm,. No murmurs, no rubs, no clicks.  LUNGS: Occasional wheezing , no crackles or accessory muscle use ABDOMEN: Soft, flat, nontender, nondistended. Has good bowel sounds. No hepatosplenomegaly appreciated.  EXTREMITIES: No evidence of any cyanosis, clubbing, or peripheral edema.  +2 pedal and radial pulses bilaterally.  NEUROLOGIC: The patient is alert, awake, and oriented x3 with no focal motor or sensory deficits appreciated bilaterally.  SKIN: Moist and warm with no rashes appreciated.  Psych: Not anxious, depressed LN: No inguinal LN enlargement    Antibiotics   Anti-infectives    Start     Dose/Rate Route Frequency Ordered Stop   03/29/16 2300  oseltamivir (TAMIFLU) capsule 75 mg     75 mg Oral 2 times daily 03/29/16 2245 04/03/16 2159   03/29/16 2300  azithromycin (ZITHROMAX) tablet 500 mg     500 mg Oral Daily 03/29/16 2245        Medications   Scheduled Meds: . amiodarone  200 mg Oral Daily  . apixaban  5 mg Oral BID  . azithromycin  500 mg Oral Daily  . guaiFENesin  600 mg Oral BID  . insulin aspart  0-5 Units Subcutaneous QHS  . insulin aspart  0-9 Units Subcutaneous TID WC  . ipratropium-albuterol  3 mL Nebulization Q4H  . levothyroxine  88 mcg Oral QAC breakfast  . loratadine  10 mg Oral Daily  . metFORMIN  1,000 mg Oral BID WC  . methylPREDNISolone (SOLU-MEDROL) injection  60 mg Intravenous Q12H  . metoprolol succinate  50 mg Oral  Daily  . oseltamivir  75 mg Oral BID  . PARoxetine  25 mg Oral Daily   Continuous Infusions: PRN Meds:.acetaminophen **OR** acetaminophen, ALPRAZolam, guaiFENesin-dextromethorphan, HYDROcodone-acetaminophen, ondansetron **OR** ondansetron (ZOFRAN) IV, promethazine, zolpidem   Data Review:   Micro Results Recent Results (from the past 240 hour(s))  C difficile quick scan w PCR reflex     Status: None   Collection Time: 03/29/16  4:53 AM  Result Value Ref Range Status   C Diff antigen NEGATIVE NEGATIVE Final   C Diff toxin NEGATIVE NEGATIVE Final   C Diff interpretation No C. difficile detected.  Final  Gastrointestinal Panel by PCR , Stool     Status: None   Collection Time: 03/29/16  4:53 AM  Result Value Ref Range Status   Campylobacter species NOT DETECTED NOT DETECTED Final   Plesimonas shigelloides NOT DETECTED NOT DETECTED Final   Salmonella species NOT DETECTED NOT DETECTED Final   Yersinia enterocolitica NOT DETECTED NOT DETECTED Final   Vibrio species NOT DETECTED NOT DETECTED Final   Vibrio cholerae NOT DETECTED NOT DETECTED Final   Enteroaggregative E coli (EAEC) NOT DETECTED NOT DETECTED Final   Enteropathogenic E coli (EPEC) NOT DETECTED NOT DETECTED Final   Enterotoxigenic E coli (ETEC) NOT DETECTED NOT DETECTED Final   Shiga like toxin producing E coli (STEC) NOT DETECTED NOT DETECTED Final   Shigella/Enteroinvasive E coli (EIEC) NOT DETECTED NOT DETECTED Final   Cryptosporidium NOT DETECTED NOT DETECTED Final   Cyclospora cayetanensis NOT DETECTED NOT DETECTED Final   Entamoeba histolytica NOT DETECTED NOT DETECTED Final   Giardia lamblia NOT DETECTED NOT DETECTED Final   Adenovirus F40/41 NOT DETECTED NOT DETECTED Final   Astrovirus NOT DETECTED NOT DETECTED Final   Norovirus GI/GII NOT DETECTED NOT DETECTED Final   Rotavirus A NOT DETECTED NOT DETECTED Final  Sapovirus (I, II, IV, and V) NOT DETECTED NOT DETECTED Final    Radiology Reports Dg Chest 2  View  Result Date: 03/29/2016 CLINICAL DATA:  Dysnea EXAM: CHEST  2 VIEW COMPARISON:  01/02/2016, 10/07/2015 FINDINGS: The heart size and mediastinal contours stable in appearance. No aortic aneurysm. Borderline cardiomegaly. Trace edema along the right major fissure. Subsegmental atelectasis at the left lung base. Tiny 2 mm nodular density overlying the right posterior fifth rib is new since prior exam and may reflect a pulmonary vessel on end or potentially tiny nodule. No overt pulmonary edema. Eventration of the right hemidiaphragm is stable. The visualized skeletal structures are unremarkable. IMPRESSION: No active cardiopulmonary disease. Platelike atelectasis at the left lung base. Tiny nodular density overlying the right upper lobe may reflect pulmonary vessel on end pulmonary nodule or summation shadow. It is unchanged in appearance when compared with recent comparison. Electronically Signed   By: Ashley Royalty M.D.   On: 03/29/2016 17:49     CBC  Recent Labs Lab 03/29/16 1636 03/30/16 0600  WBC 7.0 3.4*  HGB 14.4 12.9  HCT 41.8 38.9  PLT 293 216  MCV 89.0 89.9  MCH 30.6 29.7  MCHC 34.4 33.0  RDW 14.6* 14.3    Chemistries   Recent Labs Lab 03/29/16 1636 03/30/16 0600  NA 139 141  K 3.5 3.8  CL 97* 102  CO2 31 29  GLUCOSE 141* 253*  BUN 11 13  CREATININE 0.58 0.58  CALCIUM 9.8 9.2  AST 28  --   ALT 33  --   ALKPHOS 62  --   BILITOT 0.6  --    ------------------------------------------------------------------------------------------------------------------ estimated creatinine clearance is 93.3 mL/min (by C-G formula based on SCr of 0.58 mg/dL). ------------------------------------------------------------------------------------------------------------------ No results for input(s): HGBA1C in the last 72 hours. ------------------------------------------------------------------------------------------------------------------ No results for input(s): CHOL, HDL,  LDLCALC, TRIG, CHOLHDL, LDLDIRECT in the last 72 hours. ------------------------------------------------------------------------------------------------------------------ No results for input(s): TSH, T4TOTAL, T3FREE, THYROIDAB in the last 72 hours.  Invalid input(s): FREET3 ------------------------------------------------------------------------------------------------------------------ No results for input(s): VITAMINB12, FOLATE, FERRITIN, TIBC, IRON, RETICCTPCT in the last 72 hours.  Coagulation profile No results for input(s): INR, PROTIME in the last 168 hours.  No results for input(s): DDIMER in the last 72 hours.  Cardiac Enzymes No results for input(s): CKMB, TROPONINI, MYOGLOBIN in the last 168 hours.  Invalid input(s): CK ------------------------------------------------------------------------------------------------------------------ Invalid input(s): POCBNP    Assessment & Plan   Kirsten [name]  is a 61 y.o. female with a known history of Chronic atrial fibrillation on eliquis, sleep apnea on CPAP, depression and anxiety, diabetes mellitus-non-insulin-dependent presents to hospital secondary to worsening shortness of breath, fevers and chills and myalgias.  #1 acute hypoxic respiratory failure-secondary to hypoxia and tachypnea on exertion on admission. -Secondary to influenza Continue Tamiflu -Due to acute bronchitis continue Z-Pak -Has acute bronchospasm will treat continue therapy with nebulizers and steroids -continue oxygen support as needed at this time. Patient's oxygenation is dropped when we try to wean her off  #2 diabetes mellitus-check A1c. On metformin. Sliding scale insulin to be continued  #3 chronic atrial fibrillation-rate is controlled at this time. Patient on amiodarone and metoprolol. - continue On eliquis for anticoagulation.  #4 diarrhea-could be acute viral gastroenteritis.  Stool studies for C. difficile negative, PCR panel for stool  negative for any organism #5 hypothyroidism-continue Synthroid  #6 DVT prophylaxis - already on eliquis.      Code Status Orders        Start  Ordered   03/29/16 2245  Full code  Continuous     03/29/16 2245    Code Status History    Date Active Date Inactive Code Status Order ID Comments User Context   This patient has a current code status but no historical code status.    Advance Directive Documentation   Flowsheet Row Most Recent Value  Type of Advance Directive  Healthcare Power of Attorney, Living will  Pre-existing out of facility DNR order (yellow form or pink MOST form)  No data  "MOST" Form in Place?  No data           Consults  none   DVT Prophylaxis  Lovenox    Lab Results  Component Value Date   PLT 216 03/30/2016     Time Spent in minutes   49mn Greater than 50% of time spent in care coordination and counseling patient regarding the condition and plan of care.   PDustin FlockM.D on 03/30/2016 at 2:45 PM  Between 7am to 6pm - Pager - [phone]  After 6pm go to [email or web address] - password [email or web address]   Office  [phone]

## 2016-03-30 NOTE — Plan of Care (Signed)
Problem: Activity: Goal: Risk for activity intolerance will decrease Outcome: Progressing Pt ambulated in hall today.   Problem: Nutrition: Goal: Adequate nutrition will be maintained Outcome: Not Progressing Pt has been nauseous.

## 2016-03-30 NOTE — Progress Notes (Signed)
Pt saying tamiflu making her nausead and vomitng wants it stopped

## 2016-03-30 NOTE — Progress Notes (Signed)
Pt placed on [facility] C-4 CPAP. CPAP plugged into red outlet. Pt tolerating well. 2L O2 in line.

## 2016-03-30 NOTE — Care Management Important Message (Signed)
Important Message  Patient Details  [name] MRN: [id number] Date of Birth: August 11, 1955   Medicare Important Message Given:  Yes    Beverly Sessions, RN 03/30/2016, 12:34 PM

## 2016-03-30 NOTE — Progress Notes (Signed)
Notified Dr. Estanislado Pandy of patient's nausea. Zofran given at 0441 was not effective and patient still complains of nausea. MD ordered phenergan 12.62m once. Will give and continue to monitor.

## 2016-03-30 NOTE — Progress Notes (Signed)
Inpatient Diabetes Program Recommendations  AACE/ADA: New Consensus Statement on Inpatient Glycemic Control (2015)  Target Ranges:  Prepandial:   less than 140 mg/dL      Peak postprandial:   less than 180 mg/dL (1-2 hours)      Critically ill patients:  140 - 180 mg/dL   Lab Results  Component Value Date   GLUCAP 224 (H) 03/30/2016   HGBA1C 6.5 11/28/2015    Review of Glycemic Control  Results for LILLIEANNA, TUOHY (MRN [id number]) as of 03/30/2016 08:19  Ref. Range 03/29/2016 22:52 03/30/2016 08:01  Glucose-Capillary Latest Ref Range: 65 - 99 mg/dL 289 (H) 224 (H)    Diabetes history: Type 2 Outpatient Diabetes medications: Metformin 1038m bid Current orders for Inpatient glycemic control: Metformin 10052mbid, Novolog 0-9 units tid, Novolog 0-5 units qhs  IV prednisone bid  Inpatient Diabetes Program Recommendations:  Consider low dose Lantus while patient is on IV steroids- consider Lantus 10 units qhs (0.1units/kg)  JuGentry FitzRN, BA, MHSouth CarolinaCDE Diabetes Coordinator Inpatient Diabetes Program  [phone] Pager) [phone]/16/2018 8:22 AM

## 2016-03-31 LAB — CBC
HCT: 38.9 % (ref 35.0–47.0)
Hemoglobin: 12.6 g/dL (ref 12.0–16.0)
MCH: 29.5 pg (ref 26.0–34.0)
MCHC: 32.4 g/dL (ref 32.0–36.0)
MCV: 91.1 fL (ref 80.0–100.0)
Platelets: 273 10*3/uL (ref 150–440)
RBC: 4.27 MIL/uL (ref 3.80–5.20)
RDW: 14.2 % (ref 11.5–14.5)
WBC: 11.1 10*3/uL — AB (ref 3.6–11.0)

## 2016-03-31 LAB — GLUCOSE, CAPILLARY
GLUCOSE-CAPILLARY: 208 mg/dL — AB (ref 65–99)
GLUCOSE-CAPILLARY: 201 mg/dL — AB (ref 65–99)
Glucose-Capillary: 260 mg/dL — ABNORMAL HIGH (ref 65–99)
Glucose-Capillary: 289 mg/dL — ABNORMAL HIGH (ref 65–99)

## 2016-03-31 LAB — HEMOGLOBIN A1C
HEMOGLOBIN A1C: 7.6 % — AB (ref 4.8–5.6)
MEAN PLASMA GLUCOSE: 171 mg/dL

## 2016-03-31 MED ORDER — ALPRAZOLAM 0.25 MG PO TABS
0.2500 mg | ORAL_TABLET | Freq: Four times a day (QID) | ORAL | Status: DC | PRN
  Administered 2016-03-31 – 2016-04-01 (×3): 0.25 mg via ORAL
  Filled 2016-03-31 (×3): qty 1

## 2016-03-31 MED ORDER — KETOROLAC TROMETHAMINE 30 MG/ML IJ SOLN
30.0000 mg | Freq: Three times a day (TID) | INTRAMUSCULAR | Status: DC | PRN
  Administered 2016-03-31: 30 mg via INTRAVENOUS
  Filled 2016-03-31: qty 1

## 2016-03-31 MED ORDER — CHOLESTYRAMINE LIGHT 4 G PO PACK
4.0000 g | PACK | Freq: Two times a day (BID) | ORAL | Status: DC
  Administered 2016-03-31: 4 g via ORAL
  Filled 2016-03-31 (×3): qty 1

## 2016-03-31 MED ORDER — BUDESONIDE 0.25 MG/2ML IN SUSP
0.2500 mg | Freq: Two times a day (BID) | RESPIRATORY_TRACT | Status: DC
  Administered 2016-03-31 – 2016-04-03 (×7): 0.25 mg via RESPIRATORY_TRACT
  Filled 2016-03-31 (×7): qty 2

## 2016-03-31 MED ORDER — IPRATROPIUM-ALBUTEROL 0.5-2.5 (3) MG/3ML IN SOLN
3.0000 mL | RESPIRATORY_TRACT | Status: DC
  Administered 2016-03-31 – 2016-04-03 (×19): 3 mL via RESPIRATORY_TRACT
  Filled 2016-03-31 (×19): qty 3

## 2016-03-31 NOTE — Progress Notes (Signed)
Pt ambulated in hallway without oxygen. Sats decreased to 83% sustained. Pt reported feeling weak during ambulation. Sats up to 96% upon return to room and oxygen reapplied. No further complaints. Pt advised to keep oxygen on, will monitor.

## 2016-03-31 NOTE — Progress Notes (Signed)
[place] at [facility]                                                                                                                                                                                  Patient Demographics   Kirsten [name], is a 61 y.o. female, DOB - 1955/05/03, QMG:[id number]  Admit date - 03/29/2016   Admitting Physician Gladstone Lighter, MD  Outpatient Primary MD for the patient is [name], MD   LOS - 2  Subjective: Patient presented with shortness of breath and hypoxia. Continues to have oxygen sats dropping. Continues to complain of diarrhea, anxiety, requests high doses of Xanax. Is nonsmoker. She was diagnosed with asthma in childhood. Admits of intermittent palpitations, concerned about A. fib, RVR.   Review of Systems:   CONSTITUTIONAL: No documented fever. No fatigue, weakness. No weight gain, no weight loss.  EYES: No blurry or double vision.  ENT: No tinnitus. No postnasal drip. No redness of the oropharynx.  RESPIRATORY:Positive cough, no wheeze, no hemoptysis. Positive dyspnea.  CARDIOVASCULAR: No chest pain. No orthopnea. No palpitations. No syncope.  GASTROINTESTINAL: postive nausea,  postive vomiting or diarrhea. No abdominal pain. No melena or hematochezia.  GENITOURINARY: No dysuria or hematuria.  ENDOCRINE: No polyuria or nocturia. No heat or cold intolerance.  HEMATOLOGY: No anemia. No bruising. No bleeding.  INTEGUMENTARY: No rashes. No lesions.  MUSCULOSKELETAL: No arthritis. No swelling. No gout.  NEUROLOGIC: No numbness, tingling, or ataxia. No seizure-type activity.  PSYCHIATRIC: No anxiety. No insomnia. No ADD.    Vitals:   Vitals:   03/31/16 0447 03/31/16 0740 03/31/16 0746 03/31/16 1136  BP:  120/61    Pulse: 70 76    Resp:  17    Temp:  98.3 F (36.8 C)    TempSrc:  Oral    SpO2: 96% 97% 95% 91%  Weight:      Height:        Wt Readings from Last 3 Encounters:  03/29/16 100.3 kg (221 lb 1.6  oz)  03/29/16 99.8 kg (220 lb)  01/31/16 100.5 kg (221 lb 8 oz)     Intake/Output Summary (Last 24 hours) at 03/31/16 1352 Last data filed at 03/31/16 8916  Gross per 24 hour  Intake              120 ml  Output                0 ml  Net              120 ml    Physical Exam:   GENERAL: Pleasant-appearing in no apparent distress.  HEAD, EYES, EARS, NOSE AND  THROAT: Atraumatic, normocephalic. Extraocular muscles are intact. Pupils equal and reactive to light. Sclerae anicteric. No conjunctival injection. No oro-pharyngeal erythema.  NECK: Supple. There is no jugular venous distention. No bruits, no lymphadenopathy, no thyromegaly.  HEART: Regular rate and rhythm,. No murmurs, no rubs, no clicks.  LUNGS: Occasional wheezing , no crackles or accessory muscle use, Good air entrance bilaterally ABDOMEN: Soft, flat, nontender, nondistended. Has good bowel sounds. No hepatosplenomegaly appreciated.  EXTREMITIES: No evidence of any cyanosis, clubbing, or peripheral edema.  +2 pedal and radial pulses bilaterally.  NEUROLOGIC: The patient is alert, awake, and oriented x3 with no focal motor or sensory deficits appreciated bilaterally.  SKIN: Moist and warm with no rashes appreciated.  Psych: Not anxious, depressed LN: No inguinal LN enlargement    Antibiotics   Anti-infectives    Start     Dose/Rate Route Frequency Ordered Stop   03/29/16 2300  oseltamivir (TAMIFLU) capsule 75 mg  Status:  Discontinued     75 mg Oral 2 times daily 03/29/16 2245 03/30/16 1603   03/29/16 2300  azithromycin (ZITHROMAX) tablet 500 mg     500 mg Oral Daily 03/29/16 2245        Medications   Scheduled Meds: . amiodarone  200 mg Oral Daily  . apixaban  5 mg Oral BID  . azithromycin  500 mg Oral Daily  . budesonide (PULMICORT) nebulizer solution  0.25 mg Nebulization BID  . cholestyramine light  4 g Oral BID  . guaiFENesin  600 mg Oral BID  . insulin aspart  0-5 Units Subcutaneous QHS  . insulin aspart   0-9 Units Subcutaneous TID WC  . ipratropium-albuterol  3 mL Nebulization Q4H  . levothyroxine  88 mcg Oral QAC breakfast  . loratadine  10 mg Oral Daily  . metFORMIN  1,000 mg Oral BID WC  . methylPREDNISolone (SOLU-MEDROL) injection  60 mg Intravenous Q12H  . metoprolol succinate  50 mg Oral Daily  . PARoxetine  25 mg Oral Daily   Continuous Infusions: PRN Meds:.acetaminophen **OR** acetaminophen, ALPRAZolam, guaiFENesin-dextromethorphan, HYDROcodone-acetaminophen, ondansetron **OR** ondansetron (ZOFRAN) IV, promethazine, zolpidem   Data Review:   Micro Results Recent Results (from the past 240 hour(s))  C difficile quick scan w PCR reflex     Status: None   Collection Time: 03/29/16  4:53 AM  Result Value Ref Range Status   C Diff antigen NEGATIVE NEGATIVE Final   C Diff toxin NEGATIVE NEGATIVE Final   C Diff interpretation No C. difficile detected.  Final  Gastrointestinal Panel by PCR , Stool     Status: None   Collection Time: 03/29/16  4:53 AM  Result Value Ref Range Status   Campylobacter species NOT DETECTED NOT DETECTED Final   Plesimonas shigelloides NOT DETECTED NOT DETECTED Final   Salmonella species NOT DETECTED NOT DETECTED Final   Yersinia enterocolitica NOT DETECTED NOT DETECTED Final   Vibrio species NOT DETECTED NOT DETECTED Final   Vibrio cholerae NOT DETECTED NOT DETECTED Final   Enteroaggregative E coli (EAEC) NOT DETECTED NOT DETECTED Final   Enteropathogenic E coli (EPEC) NOT DETECTED NOT DETECTED Final   Enterotoxigenic E coli (ETEC) NOT DETECTED NOT DETECTED Final   Shiga like toxin producing E coli (STEC) NOT DETECTED NOT DETECTED Final   Shigella/Enteroinvasive E coli (EIEC) NOT DETECTED NOT DETECTED Final   Cryptosporidium NOT DETECTED NOT DETECTED Final   Cyclospora cayetanensis NOT DETECTED NOT DETECTED Final   Entamoeba histolytica NOT DETECTED NOT DETECTED Final   Giardia lamblia  NOT DETECTED NOT DETECTED Final   Adenovirus F40/41 NOT DETECTED  NOT DETECTED Final   Astrovirus NOT DETECTED NOT DETECTED Final   Norovirus GI/GII NOT DETECTED NOT DETECTED Final   Rotavirus A NOT DETECTED NOT DETECTED Final   Sapovirus (I, II, IV, and V) NOT DETECTED NOT DETECTED Final    Radiology Reports Dg Chest 2 View  Result Date: 03/29/2016 CLINICAL DATA:  Dysnea EXAM: CHEST  2 VIEW COMPARISON:  01/02/2016, 10/07/2015 FINDINGS: The heart size and mediastinal contours stable in appearance. No aortic aneurysm. Borderline cardiomegaly. Trace edema along the right major fissure. Subsegmental atelectasis at the left lung base. Tiny 2 mm nodular density overlying the right posterior fifth rib is new since prior exam and may reflect a pulmonary vessel on end or potentially tiny nodule. No overt pulmonary edema. Eventration of the right hemidiaphragm is stable. The visualized skeletal structures are unremarkable. IMPRESSION: No active cardiopulmonary disease. Platelike atelectasis at the left lung base. Tiny nodular density overlying the right upper lobe may reflect pulmonary vessel on end pulmonary nodule or summation shadow. It is unchanged in appearance when compared with recent comparison. Electronically Signed   By: Ashley Royalty M.D.   On: 03/29/2016 17:49     CBC  Recent Labs Lab 03/29/16 1636 03/30/16 0600 03/31/16 0407  WBC 7.0 3.4* 11.1*  HGB 14.4 12.9 12.6  HCT 41.8 38.9 38.9  PLT 293 216 273  MCV 89.0 89.9 91.1  MCH 30.6 29.7 29.5  MCHC 34.4 33.0 32.4  RDW 14.6* 14.3 14.2    Chemistries   Recent Labs Lab 03/29/16 1636 03/30/16 0600  NA 139 141  K 3.5 3.8  CL 97* 102  CO2 31 29  GLUCOSE 141* 253*  BUN 11 13  CREATININE 0.58 0.58  CALCIUM 9.8 9.2  AST 28  --   ALT 33  --   ALKPHOS 62  --   BILITOT 0.6  --    ------------------------------------------------------------------------------------------------------------------ estimated creatinine clearance is 93.3 mL/min (by C-G formula based on SCr of 0.58  mg/dL). ------------------------------------------------------------------------------------------------------------------  Recent Labs  03/29/16 1636  HGBA1C 7.6*   ------------------------------------------------------------------------------------------------------------------ No results for input(s): CHOL, HDL, LDLCALC, TRIG, CHOLHDL, LDLDIRECT in the last 72 hours. ------------------------------------------------------------------------------------------------------------------ No results for input(s): TSH, T4TOTAL, T3FREE, THYROIDAB in the last 72 hours.  Invalid input(s): FREET3 ------------------------------------------------------------------------------------------------------------------ No results for input(s): VITAMINB12, FOLATE, FERRITIN, TIBC, IRON, RETICCTPCT in the last 72 hours.  Coagulation profile No results for input(s): INR, PROTIME in the last 168 hours.  No results for input(s): DDIMER in the last 72 hours.  Cardiac Enzymes No results for input(s): CKMB, TROPONINI, MYOGLOBIN in the last 168 hours.  Invalid input(s): CK ------------------------------------------------------------------------------------------------------------------ Invalid input(s): POCBNP    Assessment & Plan   Kirsten [name]  is a 61 y.o. female with a known history of Chronic atrial fibrillation on eliquis, sleep apnea on CPAP, depression and anxiety, diabetes mellitus-non-insulin-dependent presents to hospital secondary to worsening shortness of breath, fevers and chills and myalgias.  #1 acute hypoxic respiratory failure with hypoxia and tachypnea on exertion on admission. -Secondary to influenza Continue Tamiflu -Due to acute bronchitis continue Z-Pak -Has acute bronchospasm will treat continue therapy with nebulizers and steroids -continue oxygen support as needed at this time. Patient's oxygenation is dropped when we try to wean her off , currently 91% on room air at rest  #2  diabetes mellitus-A1c 7.6. Continue metformin. Sliding scale insulin to be continued  #3 chronic atrial fibrillation, now in sinus rhythm, rate is controlled  at this time. Patient on amiodarone and metoprolol. - continue eliquis for anticoagulation.  #4 diarrhea-could be acute viral gastroenteritis.  Stool studies for C. difficile negative, PCR panel for stool negative for any organism, initiate cholestyramine, follow clinically  #5 hypothyroidism-continue Synthroid  #6 DVT prophylaxis - already on eliquis.  7. Anxiety, advance Xanax to  every 6 hours as needed, follow clinically      Code Status Orders        Start     Ordered   03/29/16 2245  Full code  Continuous     03/29/16 2245    Code Status History    Date Active Date Inactive Code Status Order ID Comments User Context   This patient has a current code status but no historical code status.    Advance Directive Documentation   Flowsheet Row Most Recent Value  Type of Advance Directive  Healthcare Power of Attorney, Living will  Pre-existing out of facility DNR order (yellow form or pink MOST form)  No data  "MOST" Form in Place?  No data           Consults  none   DVT Prophylaxis  Eliquis  Lab Results  Component Value Date   PLT 273 03/31/2016     Time Spent in minutes   23 min    Juanito Gonyer M.D on 03/31/2016 at 1:52 PM  Between 7am to 6pm - Pager - [phone]  After 6pm go to [email or web address] - password [email or web address] [place] [facility]   Office  [phone]

## 2016-04-01 ENCOUNTER — Inpatient Hospital Stay: Payer: PPO

## 2016-04-01 ENCOUNTER — Encounter: Payer: Self-pay | Admitting: Radiology

## 2016-04-01 LAB — GLUCOSE, CAPILLARY
GLUCOSE-CAPILLARY: 204 mg/dL — AB (ref 65–99)
Glucose-Capillary: 245 mg/dL — ABNORMAL HIGH (ref 65–99)
Glucose-Capillary: 215 mg/dL — ABNORMAL HIGH (ref 65–99)
Glucose-Capillary: 157 mg/dL — ABNORMAL HIGH (ref 65–99)

## 2016-04-01 LAB — TSH: TSH: 0.54 u[IU]/mL (ref 0.350–4.500)

## 2016-04-01 MED ORDER — IOPAMIDOL (ISOVUE-370) INJECTION 76%
75.0000 mL | Freq: Once | INTRAVENOUS | Status: AC | PRN
  Administered 2016-04-01: 75 mL via INTRAVENOUS

## 2016-04-01 NOTE — Progress Notes (Signed)
[place] at [facility]                                                                                                                                                                                  Patient Demographics   Kirsten [name], is a 61 y.o. female, DOB - 26-Jul-1955, KCL:[id number]  Admit date - 03/29/2016   Admitting Physician Gladstone Lighter, MD  Outpatient Primary MD for the patient is [name], MD    Subjective: Patient presented with shortness of breath and hypoxia. Continues to have oxygen sats dropping. Continues to complain of diarrhea, anxiety, requests high doses of Xanax. Is nonsmoker. She was diagnosed with asthma in childhood. Admits of intermittent palpitations, concerned about A. fib, RVR. Telemetry, apparently reported some bundle branch block, per patient's family and nurse, however, no documentation was found on the computer. Patient denies any significant discomfort, denies any wheezing or shortness of breath. CT angiogram of the chest revealed no pneumonia, but atelectasis. Incentive spirometer is initiated. Patient was advised to ambulate and improve oxygenation, discussed with nursing staff   Review of Systems:   CONSTITUTIONAL: No documented fever. No fatigue, weakness. No weight gain, no weight loss.  EYES: No blurry or double vision.  ENT: No tinnitus. No postnasal drip. No redness of the oropharynx.  RESPIRATORY:Positive cough, no wheeze, no hemoptysis. Positive dyspnea.  CARDIOVASCULAR: No chest pain. No orthopnea. No palpitations. No syncope.  GASTROINTESTINAL: postive nausea,  postive vomiting or diarrhea. No abdominal pain. No melena or hematochezia.  GENITOURINARY: No dysuria or hematuria.  ENDOCRINE: No polyuria or nocturia. No heat or cold intolerance.  HEMATOLOGY: No anemia. No bruising. No bleeding.  INTEGUMENTARY: No rashes. No lesions.  MUSCULOSKELETAL: No arthritis. No swelling. No gout.  NEUROLOGIC: No  numbness, tingling, or ataxia. No seizure-type activity.  PSYCHIATRIC: No anxiety. No insomnia. No ADD.    Vitals:   Vitals:   04/01/16 0304 04/01/16 0528 04/01/16 0750 04/01/16 0758  BP:  139/75 134/60   Pulse:  80 73   Resp:  20 17   Temp:  97.6 F (36.4 C) 98.1 F (36.7 C)   TempSrc:  Oral Oral   SpO2: 95% 90% 90% 95%  Weight:      Height:        Wt Readings from Last 3 Encounters:  03/29/16 100.3 kg (221 lb 1.6 oz)  03/29/16 99.8 kg (220 lb)  01/31/16 100.5 kg (221 lb 8 oz)     Intake/Output Summary (Last 24 hours) at 04/01/16 1254 Last data filed at 04/01/16 0528  Gross per 24 hour  Intake  60 ml  Output                0 ml  Net               60 ml    Physical Exam:   GENERAL: Pleasant-appearing in no apparent distress.  HEAD, EYES, EARS, NOSE AND THROAT: Atraumatic, normocephalic. Extraocular muscles are intact. Pupils equal and reactive to light. Sclerae anicteric. No conjunctival injection. No oro-pharyngeal erythema.  NECK: Supple. There is no jugular venous distention. No bruits, no lymphadenopathy, no thyromegaly.  HEART: Regular rate and rhythm,. No murmurs, no rubs, no clicks.  LUNGS: Some diminished air entrance on the right anteriorly and posteriorly, few crackles were heard, however, no wheezing, no rales, good air entrance on the left  ABDOMEN: Soft, flat, nontender, nondistended. Has good bowel sounds. No hepatosplenomegaly appreciated.  EXTREMITIES: No evidence of any cyanosis, clubbing, or peripheral edema.  +2 pedal and radial pulses bilaterally.  NEUROLOGIC: The patient is alert, awake, and oriented x3 with no focal motor or sensory deficits appreciated bilaterally.  SKIN: Moist and warm with no rashes appreciated.  Psych: Not anxious, depressed LN: No inguinal LN enlargement    Antibiotics   Anti-infectives    Start     Dose/Rate Route Frequency Ordered Stop   03/29/16 2300  oseltamivir (TAMIFLU) capsule 75 mg  Status:   Discontinued     75 mg Oral 2 times daily 03/29/16 2245 03/30/16 1603   03/29/16 2300  azithromycin (ZITHROMAX) tablet 500 mg     500 mg Oral Daily 03/29/16 2245        Medications   Scheduled Meds: . amiodarone  200 mg Oral Daily  . apixaban  5 mg Oral BID  . azithromycin  500 mg Oral Daily  . budesonide (PULMICORT) nebulizer solution  0.25 mg Nebulization BID  . cholestyramine light  4 g Oral BID  . guaiFENesin  600 mg Oral BID  . insulin aspart  0-5 Units Subcutaneous QHS  . insulin aspart  0-9 Units Subcutaneous TID WC  . ipratropium-albuterol  3 mL Nebulization Q4H  . levothyroxine  88 mcg Oral QAC breakfast  . loratadine  10 mg Oral Daily  . metFORMIN  1,000 mg Oral BID WC  . metoprolol succinate  50 mg Oral Daily  . PARoxetine  25 mg Oral Daily   Continuous Infusions: PRN Meds:.acetaminophen **OR** acetaminophen, ALPRAZolam, guaiFENesin-dextromethorphan, HYDROcodone-acetaminophen, ketorolac, ondansetron **OR** ondansetron (ZOFRAN) IV, promethazine, zolpidem   Data Review:   Micro Results Recent Results (from the past 240 hour(s))  C difficile quick scan w PCR reflex     Status: None   Collection Time: 03/29/16  4:53 AM  Result Value Ref Range Status   C Diff antigen NEGATIVE NEGATIVE Final   C Diff toxin NEGATIVE NEGATIVE Final   C Diff interpretation No C. difficile detected.  Final  Gastrointestinal Panel by PCR , Stool     Status: None   Collection Time: 03/29/16  4:53 AM  Result Value Ref Range Status   Campylobacter species NOT DETECTED NOT DETECTED Final   Plesimonas shigelloides NOT DETECTED NOT DETECTED Final   Salmonella species NOT DETECTED NOT DETECTED Final   Yersinia enterocolitica NOT DETECTED NOT DETECTED Final   Vibrio species NOT DETECTED NOT DETECTED Final   Vibrio cholerae NOT DETECTED NOT DETECTED Final   Enteroaggregative E coli (EAEC) NOT DETECTED NOT DETECTED Final   Enteropathogenic E coli (EPEC) NOT DETECTED NOT DETECTED Final    Enterotoxigenic  E coli (ETEC) NOT DETECTED NOT DETECTED Final   Shiga like toxin producing E coli (STEC) NOT DETECTED NOT DETECTED Final   Shigella/Enteroinvasive E coli (EIEC) NOT DETECTED NOT DETECTED Final   Cryptosporidium NOT DETECTED NOT DETECTED Final   Cyclospora cayetanensis NOT DETECTED NOT DETECTED Final   Entamoeba histolytica NOT DETECTED NOT DETECTED Final   Giardia lamblia NOT DETECTED NOT DETECTED Final   Adenovirus F40/41 NOT DETECTED NOT DETECTED Final   Astrovirus NOT DETECTED NOT DETECTED Final   Norovirus GI/GII NOT DETECTED NOT DETECTED Final   Rotavirus A NOT DETECTED NOT DETECTED Final   Sapovirus (I, II, IV, and V) NOT DETECTED NOT DETECTED Final    Radiology Reports Dg Chest 2 View  Result Date: 03/29/2016 CLINICAL DATA:  Dysnea EXAM: CHEST  2 VIEW COMPARISON:  01/02/2016, 10/07/2015 FINDINGS: The heart size and mediastinal contours stable in appearance. No aortic aneurysm. Borderline cardiomegaly. Trace edema along the right major fissure. Subsegmental atelectasis at the left lung base. Tiny 2 mm nodular density overlying the right posterior fifth rib is new since prior exam and may reflect a pulmonary vessel on end or potentially tiny nodule. No overt pulmonary edema. Eventration of the right hemidiaphragm is stable. The visualized skeletal structures are unremarkable. IMPRESSION: No active cardiopulmonary disease. Platelike atelectasis at the left lung base. Tiny nodular density overlying the right upper lobe may reflect pulmonary vessel on end pulmonary nodule or summation shadow. It is unchanged in appearance when compared with recent comparison. Electronically Signed   By: Ashley Royalty M.D.   On: 03/29/2016 17:49   Ct Angio Chest Pe W Or Wo Contrast  Result Date: 04/01/2016 CLINICAL DATA:  Short of breath and hypoxia EXAM: CT ANGIOGRAPHY CHEST WITH CONTRAST TECHNIQUE: Multidetector CT imaging of the chest was performed using the standard protocol during bolus  administration of intravenous contrast. Multiplanar CT image reconstructions and MIPs were obtained to evaluate the vascular anatomy. CONTRAST:  75 cc Isovue 370 COMPARISON:  03/12/2009 FINDINGS: Cardiovascular: There are no filling defects in the pulmonary arterial tree to suggest acute pulmonary thromboembolism. Left-sided SVC anatomy is noted. The heart is enlarged. Aorta is non aneurysmal and patent. Mediastinum/Nodes: No abnormal mediastinal adenopathy. No pericardial effusion. Left thyroid lobe is heterogeneous. Esophagus is unremarkable. Lungs/Pleura: Lungs are markedly under aerated with subsegmental atelectasis at the lung bases. 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 Upper Abdomen: No acute abnormality. Musculoskeletal: No vertebral compression deformity. Review of the MIP images confirms the above findings. IMPRESSION: No evidence of acute pulmonary thromboembolism Bibasilar atelectasis. Electronically Signed   By: Marybelle Killings M.D.   On: 04/01/2016 10:03     CBC  Recent Labs Lab 03/29/16 1636 03/30/16 0600 03/31/16 0407  WBC 7.0 3.4* 11.1*  HGB 14.4 12.9 12.6  HCT 41.8 38.9 38.9  PLT 293 216 273  MCV 89.0 89.9 91.1  MCH 30.6 29.7 29.5  MCHC 34.4 33.0 32.4  RDW 14.6* 14.3 14.2    Chemistries   Recent Labs Lab 03/29/16 1636 03/30/16 0600  NA 139 141  K 3.5 3.8  CL 97* 102  CO2 31 29  GLUCOSE 141* 253*  BUN 11 13  CREATININE 0.58 0.58  CALCIUM 9.8 9.2  AST 28  --   ALT 33  --   ALKPHOS 62  --   BILITOT 0.6  --    ------------------------------------------------------------------------------------------------------------------ estimated creatinine clearance is 93.3 mL/min (by C-G formula based on SCr of  0.58 mg/dL). ------------------------------------------------------------------------------------------------------------------  Recent Labs  03/29/16 1636  HGBA1C 7.6*    ------------------------------------------------------------------------------------------------------------------ No results for input(s): CHOL, HDL, LDLCALC, TRIG, CHOLHDL, LDLDIRECT in the last 72 hours. ------------------------------------------------------------------------------------------------------------------ No results for input(s): TSH, T4TOTAL, T3FREE, THYROIDAB in the last 72 hours.  Invalid input(s): FREET3 ------------------------------------------------------------------------------------------------------------------ No results for input(s): VITAMINB12, FOLATE, FERRITIN, TIBC, IRON, RETICCTPCT in the last 72 hours.  Coagulation profile No results for input(s): INR, PROTIME in the last 168 hours.  No results for input(s): DDIMER in the last 72 hours.  Cardiac Enzymes No results for input(s): CKMB, TROPONINI, MYOGLOBIN in the last 168 hours.  Invalid input(s): CK ------------------------------------------------------------------------------------------------------------------ Invalid input(s): POCBNP    Assessment & Plan   Vannessa Godown  is a 61 y.o. female with a known history of Chronic atrial fibrillation on eliquis, sleep apnea on CPAP, depression and anxiety, diabetes mellitus-non-insulin-dependent presents to hospital secondary to worsening shortness of breath, fevers and chills and myalgias.  #1 acute hypoxic respiratory failure with hypoxia and tachypnea on exertion on admission. -Secondary to influenza , Refuses Tamiflu -Due to acute bronchitis continue Z-Pak -Had acute bronchospasm , dyspnea nebulizers and discontinue steroids -continue oxygen support as needed . CT angiogram of the chest revealed atelectasis, initiate incentive spirometry, patient was advised to walk, exercise and speak to improve her oxygenation. Discussed with nursing staff, attempted to call patient's room, not answering  #2 diabetes mellitus-A1c 7.6. Continue metformin. Sliding  scale insulin to be continued  #3 . Paroxysmal atrial fibrillation, now in sinus rhythm, rate is controlled at this time. Patient on amiodarone and metoprolol. - continue eliquis for anticoagulation.  #4 diarrhea-could be acute viral gastroenteritis.  Stool studies for C. difficile negative, PCR panel for stool negative for any organism, initiated cholestyramine, improved clinically  #5 hypothyroidism-continue Synthroid. Check TSH  #6 DVT prophylaxis - already on eliquis.  7. Anxiety, continue Xanax as needed, stable clinically      Code Status Orders        Start     Ordered   03/29/16 2245  Full code  Continuous     03/29/16 2245    Code Status History    Date Active Date Inactive Code Status Order ID Comments User Context   This patient has a current code status but no historical code status.    Advance Directive Documentation   Flowsheet Row Most Recent Value  Type of Advance Directive  Healthcare Power of Attorney, Living will  Pre-existing out of facility DNR order (yellow form or pink MOST form)  No data  "MOST" Form in Place?  No data           Consults  none   DVT Prophylaxis  Eliquis  Lab Results  Component Value Date   PLT 273 03/31/2016     Time Spent in minutes   40 min  Discussed this patient's husband, all questions were answered.  More than 50% of time was spent on coordination of care  Lynnsie Linders M.D on 04/01/2016 at 12:54 PM  Between 7am to 6pm - Pager - [phone]  After 6pm go to [email or web address] - password [email or web address] [facility] [facility]   Office  [phone]

## 2016-04-01 NOTE — Progress Notes (Signed)
Patient ambulated around unit without Oxygen; O2 89% on R/A; incentive spirometry used this shift; educated patient on importance of ambulating and incentive; patient remains on 1L/[place]; spouse at bedside; Will continue to monitor.

## 2016-04-02 LAB — GLUCOSE, CAPILLARY
GLUCOSE-CAPILLARY: 109 mg/dL — AB (ref 65–99)
GLUCOSE-CAPILLARY: 121 mg/dL — AB (ref 65–99)
GLUCOSE-CAPILLARY: 141 mg/dL — AB (ref 65–99)
Glucose-Capillary: 115 mg/dL — ABNORMAL HIGH (ref 65–99)

## 2016-04-02 NOTE — Progress Notes (Signed)
[place] at [facility]                                                                                                                                                                                  Patient Demographics   Kirsten [name], is a 61 y.o. female, DOB - 1955-05-25, CBS:[id number]  Admit date - 03/29/2016   Admitting Physician Gladstone Lighter, MD  Outpatient Primary MD for the patient is Kirsten Calico, MD    Subjective: Patient presented with shortness of breath and hypoxia. Continues to have oxygen sats dropping. Continues to complain of diarrhea, anxiety, requests high doses of Xanax. Is nonsmoker. She was diagnosed with asthma in childhood. Admits of intermittent palpitations, concerned about A. fib, RVR. Telemetry, apparently reported some bundle branch block, per patient's family and nurse, however, no documentation was found on the computer. Patient denies any significant discomfort, denies any wheezing or shortness of breath. CT angiogram of the chest revealed no pneumonia, but atelectasis. Incentive spirometer is initiated. Patient was advised to ambulate and improve oxygenation, discussed with nursing staff The patient feels good today, denies any significant shortness of breath, although admits of lightheadedness and dizziness whenever she is off oxygen therapy, especially on exertion, O2 sats dropped down to 88% on room air on exertion. CTA of the chest revealed atelectasis, no PE, patient is continued on incentive spirometry  Review of Systems:   CONSTITUTIONAL: No documented fever. No fatigue, weakness. No weight gain, no weight loss.  EYES: No blurry or double vision.  ENT: No tinnitus. No postnasal drip. No redness of the oropharynx.  RESPIRATORY:Positive cough, no wheeze, no hemoptysis. Positive dyspnea.  CARDIOVASCULAR: No chest pain. No orthopnea. No palpitations. No syncope.  GASTROINTESTINAL: postive nausea,  postive vomiting or  diarrhea. No abdominal pain. No melena or hematochezia.  GENITOURINARY: No dysuria or hematuria.  ENDOCRINE: No polyuria or nocturia. No heat or cold intolerance.  HEMATOLOGY: No anemia. No bruising. No bleeding.  INTEGUMENTARY: No rashes. No lesions.  MUSCULOSKELETAL: No arthritis. No swelling. No gout.  NEUROLOGIC: No numbness, tingling, or ataxia. No seizure-type activity.  PSYCHIATRIC: No anxiety. No insomnia. No ADD.    Vitals:   Vitals:   04/02/16 0423 04/02/16 0802 04/02/16 0806 04/02/16 1139  BP: (!) 113/53 131/70    Pulse: 65 74    Resp: 20 17    Temp: 97.4 F (36.3 C)     TempSrc: Oral     SpO2: 96% 95% (!) 88% 96%  Weight:      Height:        Wt Readings from Last 3 Encounters:  03/29/16 100.3 kg (221 lb 1.6 oz)  03/29/16  99.8 kg (220 lb)  01/31/16 100.5 kg (221 lb 8 oz)     Intake/Output Summary (Last 24 hours) at 04/02/16 1400 Last data filed at 04/02/16 0453  Gross per 24 hour  Intake              360 ml  Output                0 ml  Net              360 ml    Physical Exam:   GENERAL: Pleasant-appearing in no apparent distress.  HEAD, EYES, EARS, NOSE AND THROAT: Atraumatic, normocephalic. Extraocular muscles are intact. Pupils equal and reactive to light. Sclerae anicteric. No conjunctival injection. No oro-pharyngeal erythema.  NECK: Supple. There is no jugular venous distention. No bruits, no lymphadenopathy, no thyromegaly.  HEART: Regular rate and rhythm,. No murmurs, no rubs, no clicks.  LUNGS: Good air entrance bilaterally, certainly as well as anteriorly, crackles posteriorly on the right,  no wheezing, no rales  ABDOMEN: Soft, flat, nontender, nondistended. Has good bowel sounds. No hepatosplenomegaly appreciated.  EXTREMITIES: No evidence of any cyanosis, clubbing, or peripheral edema.  +2 pedal and radial pulses bilaterally.  NEUROLOGIC: The patient is alert, awake, and oriented x3 with no focal motor or sensory deficits appreciated  bilaterally.  SKIN: Moist and warm with no rashes appreciated.  Psych: Not anxious, depressed LN: No inguinal LN enlargement    Antibiotics   Anti-infectives    Start     Dose/Rate Route Frequency Ordered Stop   03/29/16 2300  oseltamivir (TAMIFLU) capsule 75 mg  Status:  Discontinued     75 mg Oral 2 times daily 03/29/16 2245 03/30/16 1603   03/29/16 2300  azithromycin (ZITHROMAX) tablet 500 mg     500 mg Oral Daily 03/29/16 2245        Medications   Scheduled Meds: . amiodarone  200 mg Oral Daily  . apixaban  5 mg Oral BID  . azithromycin  500 mg Oral Daily  . budesonide (PULMICORT) nebulizer solution  0.25 mg Nebulization BID  . cholestyramine light  4 g Oral BID  . guaiFENesin  600 mg Oral BID  . insulin aspart  0-5 Units Subcutaneous QHS  . insulin aspart  0-9 Units Subcutaneous TID WC  . ipratropium-albuterol  3 mL Nebulization Q4H  . levothyroxine  88 mcg Oral QAC breakfast  . loratadine  10 mg Oral Daily  . metFORMIN  1,000 mg Oral BID WC  . metoprolol succinate  50 mg Oral Daily  . PARoxetine  25 mg Oral Daily   Continuous Infusions: PRN Meds:.acetaminophen **OR** acetaminophen, ALPRAZolam, guaiFENesin-dextromethorphan, HYDROcodone-acetaminophen, ketorolac, ondansetron **OR** ondansetron (ZOFRAN) IV, promethazine, zolpidem   Data Review:   Micro Results Recent Results (from the past 240 hour(s))  C difficile quick scan w PCR reflex     Status: None   Collection Time: 03/29/16  4:53 AM  Result Value Ref Range Status   C Diff antigen NEGATIVE NEGATIVE Final   C Diff toxin NEGATIVE NEGATIVE Final   C Diff interpretation No C. difficile detected.  Final  Gastrointestinal Panel by PCR , Stool     Status: None   Collection Time: 03/29/16  4:53 AM  Result Value Ref Range Status   Campylobacter species NOT DETECTED NOT DETECTED Final   Plesimonas shigelloides NOT DETECTED NOT DETECTED Final   Salmonella species NOT DETECTED NOT DETECTED Final   Yersinia  enterocolitica NOT DETECTED NOT DETECTED Final  Vibrio species NOT DETECTED NOT DETECTED Final   Vibrio cholerae NOT DETECTED NOT DETECTED Final   Enteroaggregative E coli (EAEC) NOT DETECTED NOT DETECTED Final   Enteropathogenic E coli (EPEC) NOT DETECTED NOT DETECTED Final   Enterotoxigenic E coli (ETEC) NOT DETECTED NOT DETECTED Final   Shiga like toxin producing E coli (STEC) NOT DETECTED NOT DETECTED Final   Shigella/Enteroinvasive E coli (EIEC) NOT DETECTED NOT DETECTED Final   Cryptosporidium NOT DETECTED NOT DETECTED Final   Cyclospora cayetanensis NOT DETECTED NOT DETECTED Final   Entamoeba histolytica NOT DETECTED NOT DETECTED Final   Giardia lamblia NOT DETECTED NOT DETECTED Final   Adenovirus F40/41 NOT DETECTED NOT DETECTED Final   Astrovirus NOT DETECTED NOT DETECTED Final   Norovirus GI/GII NOT DETECTED NOT DETECTED Final   Rotavirus A NOT DETECTED NOT DETECTED Final   Sapovirus (I, II, IV, and V) NOT DETECTED NOT DETECTED Final    Radiology Reports Dg Chest 2 View  Result Date: 03/29/2016 CLINICAL DATA:  Dysnea EXAM: CHEST  2 VIEW COMPARISON:  01/02/2016, 10/07/2015 FINDINGS: The heart size and mediastinal contours stable in appearance. No aortic aneurysm. Borderline cardiomegaly. Trace edema along the right major fissure. Subsegmental atelectasis at the left lung base. Tiny 2 mm nodular density overlying the right posterior fifth rib is new since prior exam and may reflect a pulmonary vessel on end or potentially tiny nodule. No overt pulmonary edema. Eventration of the right hemidiaphragm is stable. The visualized skeletal structures are unremarkable. IMPRESSION: No active cardiopulmonary disease. Platelike atelectasis at the left lung base. Tiny nodular density overlying the right upper lobe may reflect pulmonary vessel on end pulmonary nodule or summation shadow. It is unchanged in appearance when compared with recent comparison. Electronically Signed   By: Ashley Royalty M.D.    On: 03/29/2016 17:49   Ct Angio Chest Pe W Or Wo Contrast  Result Date: 04/01/2016 CLINICAL DATA:  Short of breath and hypoxia EXAM: CT ANGIOGRAPHY CHEST WITH CONTRAST TECHNIQUE: Multidetector CT imaging of the chest was performed using the standard protocol during bolus administration of intravenous contrast. Multiplanar CT image reconstructions and MIPs were obtained to evaluate the vascular anatomy. CONTRAST:  75 cc Isovue 370 COMPARISON:  03/12/2009 FINDINGS: Cardiovascular: There are no filling defects in the pulmonary arterial tree to suggest acute pulmonary thromboembolism. Left-sided SVC anatomy is noted. The heart is enlarged. Aorta is non aneurysmal and patent. Mediastinum/Nodes: No abnormal mediastinal adenopathy. No pericardial effusion. Left thyroid lobe is heterogeneous. Esophagus is unremarkable. Lungs/Pleura: Lungs are markedly under aerated with subsegmental atelectasis at the lung bases. 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 Upper Abdomen: No acute abnormality. Musculoskeletal: No vertebral compression deformity. Review of the MIP images confirms the above findings. IMPRESSION: No evidence of acute pulmonary thromboembolism Bibasilar atelectasis. Electronically Signed   By: Marybelle Killings M.D.   On: 04/01/2016 10:03     CBC  Recent Labs Lab 03/29/16 1636 03/30/16 0600 03/31/16 0407  WBC 7.0 3.4* 11.1*  HGB 14.4 12.9 12.6  HCT 41.8 38.9 38.9  PLT 293 216 273  MCV 89.0 89.9 91.1  MCH 30.6 29.7 29.5  MCHC 34.4 33.0 32.4  RDW 14.6* 14.3 14.2    Chemistries   Recent Labs Lab 03/29/16 1636 03/30/16 0600  NA 139 141  K 3.5 3.8  CL 97* 102  CO2 31 29  GLUCOSE 141* 253*  BUN 11 13  CREATININE 0.58 0.58  CALCIUM 9.8  9.2  AST 28  --   ALT 33  --   ALKPHOS 62  --   BILITOT 0.6  --     ------------------------------------------------------------------------------------------------------------------ estimated creatinine clearance is 93.3 mL/min (by C-G formula based on SCr of 0.58 mg/dL). ------------------------------------------------------------------------------------------------------------------ No results for input(s): HGBA1C in the last 72 hours. ------------------------------------------------------------------------------------------------------------------ No results for input(s): CHOL, HDL, LDLCALC, TRIG, CHOLHDL, LDLDIRECT in the last 72 hours. ------------------------------------------------------------------------------------------------------------------  Recent Labs  03/31/16 0407  TSH 0.540   ------------------------------------------------------------------------------------------------------------------ No results for input(s): VITAMINB12, FOLATE, FERRITIN, TIBC, IRON, RETICCTPCT in the last 72 hours.  Coagulation profile No results for input(s): INR, PROTIME in the last 168 hours.  No results for input(s): DDIMER in the last 72 hours.  Cardiac Enzymes No results for input(s): CKMB, TROPONINI, MYOGLOBIN in the last 168 hours.  Invalid input(s): CK ------------------------------------------------------------------------------------------------------------------ Invalid input(s): POCBNP    Assessment & Plan   Shavette Shoaff  is a 61 y.o. female with a known history of Chronic atrial fibrillation on eliquis, sleep apnea on CPAP, depression and anxiety, diabetes mellitus-non-insulin-dependent presents to hospital secondary to worsening shortness of breath, fevers and chills and myalgias.  #1 acute hypoxic respiratory failure with hypoxia and tachypnea on exertion on admission. -Secondary to influenza , Has been refusing Tamiflu, per nursing staff -Due to acute bronchitis, finish Z-Pak -Had acute bronchospasm , dyspnea, continue nebulizers  and now off steroids -continue oxygen support as needed . CT angiogram of the chest revealed atelectasis, continue incentive spirometry, patient was advised to walk, exercise and speak to improve her oxygenation. 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   #2 diabetes mellitus-A1c 7.6. Continue metformin. Sliding scale insulin to be continued  #3 . Paroxysmal atrial fibrillation, now in sinus rhythm, rate is controlled at this time. Patient on amiodarone and metoprolol. - continue eliquis for anticoagulation.  #4 diarrhea-could be acute viral gastroenteritis.  Stool studies for C. difficile were negative, PCR panel for stool negative for any organism, symptoms resolved, discontinue cholestyramine, follow clinically  #5 hypothyroidism-continue Synthroid. TSH was 0.5, normal  #6 DVT prophylaxis - already on eliquis.  7. Anxiety, continue Xanax as needed, stable clinically      Code Status Orders        Start     Ordered   03/29/16 2245  Full code  Continuous     03/29/16 2245    Code Status History    Date Active Date Inactive Code Status Order ID Comments User Context   This patient has a current code status but no historical code status.    Advance Directive Documentation   Flowsheet Row Most Recent Value  Type of Advance Directive  Healthcare Power of Attorney, Living will  Pre-existing out of facility DNR order (yellow form or pink MOST form)  No data  "MOST" Form in Place?  No data           Consults  none   DVT Prophylaxis  Eliquis  Lab Results  Component Value Date   PLT 273 03/31/2016     Time Spent in minutes   35 min  Discussed with patient extensively, all questions were answered  Alyas Creary M.D on 04/02/2016 at 2:00 PM  Between 7am to 6pm - Pager - [phone]  After 6pm go to [email or web address] - password [email or web address]  [facility]   Office  [phone]

## 2016-04-03 DIAGNOSIS — R197 Diarrhea, unspecified: Secondary | ICD-10-CM

## 2016-04-03 DIAGNOSIS — J209 Acute bronchitis, unspecified: Secondary | ICD-10-CM

## 2016-04-03 DIAGNOSIS — F411 Generalized anxiety disorder: Secondary | ICD-10-CM

## 2016-04-03 DIAGNOSIS — J441 Chronic obstructive pulmonary disease with (acute) exacerbation: Secondary | ICD-10-CM

## 2016-04-03 DIAGNOSIS — J101 Influenza due to other identified influenza virus with other respiratory manifestations: Secondary | ICD-10-CM

## 2016-04-03 DIAGNOSIS — E669 Obesity, unspecified: Secondary | ICD-10-CM

## 2016-04-03 DIAGNOSIS — E8770 Fluid overload, unspecified: Secondary | ICD-10-CM

## 2016-04-03 LAB — CREATININE, SERUM
Creatinine, Ser: 0.7 mg/dL (ref 0.44–1.00)
GFR calc non Af Amer: >60 mL/min (ref >60)

## 2016-04-03 LAB — CBC
HCT: 37.4 % (ref 35.0–47.0)
HEMOGLOBIN: 12.7 g/dL (ref 12.0–16.0)
MCH: 30.9 pg (ref 26.0–34.0)
MCHC: 34 g/dL (ref 32.0–36.0)
MCV: 90.9 fL (ref 80.0–100.0)
Platelets: 256 10*3/uL (ref 150–440)
RBC: 4.12 MIL/uL (ref 3.80–5.20)
RDW: 14.4 % (ref 11.5–14.5)
WBC: 6.8 10*3/uL (ref 3.6–11.0)

## 2016-04-03 LAB — GLUCOSE, CAPILLARY
GLUCOSE-CAPILLARY: 118 mg/dL — AB (ref 65–99)
Glucose-Capillary: 106 mg/dL — ABNORMAL HIGH (ref 65–99)

## 2016-04-03 MED ORDER — POTASSIUM CHLORIDE CRYS ER 20 MEQ PO TBCR
20.0000 meq | EXTENDED_RELEASE_TABLET | Freq: Once | ORAL | Status: AC
  Administered 2016-04-03: 20 meq via ORAL
  Filled 2016-04-03: qty 1

## 2016-04-03 MED ORDER — FUROSEMIDE 10 MG/ML IJ SOLN
20.0000 mg | Freq: Once | INTRAMUSCULAR | Status: AC
  Administered 2016-04-03: 20 mg via INTRAVENOUS
  Filled 2016-04-03: qty 2

## 2016-04-03 NOTE — Progress Notes (Signed)
Discharge instructions reviewed with patient.  Understanding was verbalized and all questions were answered.  Patient discharged home via wheelchair in stable condition escorted by volunteer staff.

## 2016-04-03 NOTE — Discharge Instructions (Signed)

## 2016-04-03 NOTE — Care Management (Signed)
Patient does not qualify for home O2 at discharge.

## 2016-04-03 NOTE — Discharge Summary (Signed)
[facility] [place] [name]    MR#:  [id number]  DATE OF BIRTH:  Oct 20, 1955  DATE OF ADMISSION:  03/29/2016 ADMITTING PHYSICIAN: Gladstone Lighter, MD  DATE OF DISCHARGE: No discharge date for patient encounter.  PRIMARY CARE PHYSICIAN: [name], MD     ADMISSION DIAGNOSIS:  Exacerbation of asthma, unspecified asthma severity, unspecified whether persistent [J45.901]  DISCHARGE DIAGNOSIS:  Principal Problem:   Acute respiratory failure with hypoxia ([place]) Active Problems:   Influenza A   Acute bronchitis   COPD exacerbation ([facility])   Diarrhea   Fluid overload   Obesity   Anxious reaction   SECONDARY DIAGNOSIS:   Past Medical History:  Diagnosis Date  . A-fib ([place])   . Anxiety disorder   . Asthma   . Gastroschisis    umphalocele, rotated organs abd repair until age 67  . GERD (gastroesophageal reflux disease)   . HTN (hypertension)   . Hyperlipidemia   . IBS (irritable bowel syndrome)   . Morbid obesity ([place])    Target wt - 185  for BMI < 30  . Obesity   . Pulmonary fibrosis ([place])   . SBO (small bowel obstruction)    Resolved with NG/Bowel rest around 2009  . Sleep apnea   . Type II or unspecified type diabetes mellitus without mention of complication, not stated as uncontrolled     .pro HOSPITAL COURSE:  Patient is 61 year old Caucasian female with past medical history significant for history of atrial fibrillation, anxiety, umphalocele, has esophageal reflux disease, hypertension, hyperlipidemia, irritable bowel syndrome, obesity, who presents to the hospital with complaints of shortness of breath, fever, chills, myalgias, which started about 5 days ago. She also noted wheezing, dry cough, low-grade fever. She was seen by primary care physician, noted to be hypoxic, was sent to emergency room for further evaluation. Her oxygen saturations were 88-89% on room air, patient was tachypneic with  exertion, she was placed on 2 L of oxygen through nasal cannula and admitted to the hospital. Chest x-ray revealed left lower lobe atelectasis. Chest CT angiogram showed a basal atelectasis, no PE. Labs revealed leukopenia, influenza A test by PCR was positive.  Patient was initiated on Tamiflu, antibiotic therapy, inhalation therapy, steroids, nebulizers, she clinically improved, however, was difficult to wean off oxygen. It was felt that patient's hypoxia could have been related to atelectasis, possible pneumonia, history of umphalocele, influenza, also possibly fluid overload with therapy. She clinically improved and was stable to be discharged home. She was ambulated around nursing station without oxygen, on room air and her oxygen saturations were stable, dropping down, not lower than 90%. Discussion by problem: #1. Acute respiratory failure with hypoxia due to influenza, acute bronchitis versus basilar pneumonia. Patient finished Tamiflu, Z-Pak courses . She was continued on nebulizers, steroids in the hospital, improved significantly. CT angiogram of the chest revealed atelectasis, questionable pneumonia, patient was advised to continue incentive spirometry. She was noted to have lower extremity swelling and diminished breath sounds bilaterally in the lungs, concerning for fluid overload, she was given Lasix intravenously, diuresed, and her oxygenation improved. It is recommended to follow patient's echocardiogram as outpatient #2. Diabetes mellitus type 2, hemoglobin A1c 7.6, patient is to continue metformin as outpatient, diabetic diet #3. Paroxysmal atrial fibrillation, now in sinus rhythm, rate controlled, patient is to continue amiodarone, metoprolol, Eliquis. #4. Diarrhea, unclear etiology, likely viral gastroenteritis,  stool studies for C. difficile, gastrointestinal panel was negative for  organism, symptoms resolved, patient was given a few doses of cholestyramine while in the hospital, now  discontinued #5 hypothyroidism, patient is to continue Synthroid, TSH was 0.5 #6. Anxiety, patient is to continue Xanax as needed #7. Influenza a, patient finished Tamiflu course  #8. Acute bronchitis versus basilar pneumonia, patient finished Zithromax course  DISCHARGE CONDITIONS:   Stable  CONSULTS OBTAINED:    DRUG ALLERGIES:   Allergies  Allergen Reactions  . Factive [Gemifloxacin Mesylate] Rash  . Crestor [Rosuvastatin]     GI upset  . Sulfonamide Derivatives     REACTION: rash  . Gemifloxacin Rash  . Penicillins Hives and Rash    Has patient had a PCN reaction causing immediate rash, facial/tongue/throat swelling, SOB or lightheadedness with hypotension: No Has patient had a PCN reaction causing severe rash involving mucus membranes or skin necrosis: No Has patient had a PCN reaction that required hospitalization No Has patient had a PCN reaction occurring within the last 10 years: No If all of the above answers are "NO", then may proceed with Cephalosporin use.     DISCHARGE MEDICATIONS:   Current Discharge Medication List    CONTINUE these medications which have NOT CHANGED   Details  albuterol (PROVENTIL HFA;VENTOLIN HFA) 108 (90 Base) MCG/ACT inhaler Inhale 2 puffs into the lungs every 6 (six) hours as needed for wheezing or shortness of breath. Qty: 1 Inhaler, Refills: 0   Associated Diagnoses: Mild persistent asthma with acute exacerbation; Abnormal lung sounds    ALPRAZolam (XANAX) 0.25 MG tablet take 1 tablet by mouth once daily if needed Qty: 90 tablet, Refills: 1   Associated Diagnoses: GAD (generalized anxiety disorder); Depression with anxiety    amiodarone (PACERONE) 200 MG tablet Take 200 mg by mouth daily.    apixaban (ELIQUIS) 5 MG TABS tablet Take 5 mg by mouth 2 (two) times daily.    cetirizine (ZYRTEC) 10 MG tablet Take 1 tablet (10 mg total) by mouth daily. Qty: 30 tablet, Refills: 5   Associated Diagnoses: Mild persistent asthma with  acute exacerbation; Cough    hydrochlorothiazide (HYDRODIURIL) 25 MG tablet Take 1 tablet (25 mg total) by mouth daily. Qty: 90 tablet, Refills: 3   Associated Diagnoses: Essential hypertension, benign    metFORMIN (GLUCOPHAGE) 1000 MG tablet take 1 tablet by mouth twice a day with A MEAL Qty: 180 tablet, Refills: 1   Associated Diagnoses: Essential hypertension, benign    metoprolol succinate (TOPROL-XL) 50 MG 24 hr tablet Take 50 mg by mouth daily.     PARoxetine (PAXIL-CR) 25 MG 24 hr tablet take 1 tablet by mouth once daily Qty: 90 tablet, Refills: 3   Associated Diagnoses: Depression    SYNTHROID 88 MCG tablet take 1 tablet by mouth once daily BEFORE BREAKFAST Qty: 90 tablet, Refills: 1   Associated Diagnoses: Other specified hypothyroidism    zolpidem (AMBIEN) 10 MG tablet take 1 tablet by mouth at bedtime if needed Qty: 90 tablet, Refills: 1   Associated Diagnoses: Insomnia due to anxiety and fear    glucose blood (ONE TOUCH ULTRA TEST) test strip Use as instructed Qty: 100 each, Refills: 12      STOP taking these medications     oseltamivir (TAMIFLU) 75 MG capsule          DISCHARGE INSTRUCTIONS:    The patient is to follow-up with primary care physician within one week after discharge, she may benefit from echocardiogram as outpatient  If you experience worsening of your admission symptoms,  develop shortness of breath, life threatening emergency, suicidal or homicidal thoughts you must seek medical attention immediately by calling 911 or calling your MD immediately  if symptoms less severe.  You Must read complete instructions/literature along with all the possible adverse reactions/side effects for all the Medicines you take and that have been prescribed to you. Take any new Medicines after you have completely understood and accept all the possible adverse reactions/side effects.   Please note  You were cared for by a hospitalist during your hospital stay. If  you have any questions about your discharge medications or the care you received while you were in the hospital after you are discharged, you can call the unit and asked to speak with the hospitalist on call if the hospitalist that took care of you is not available. Once you are discharged, your primary care physician will handle any further medical issues. Please note that NO REFILLS for any discharge medications will be authorized once you are discharged, as it is imperative that you return to your primary care physician (or establish a relationship with a primary care physician if you do not have one) for your aftercare needs so that they can reassess your need for medications and monitor your lab values.    Today   CHIEF COMPLAINT:   Chief Complaint  Patient presents with  . Cough  . Wheezing    HISTORY OF PRESENT ILLNESS:  Hattie Pine  is a 61 y.o. female with a known history of atrial fibrillation, anxiety, umphalocele, has esophageal reflux disease, hypertension, hyperlipidemia, irritable bowel syndrome, obesity, who presents to the hospital with complaints of shortness of breath, fever, chills, myalgias, which started about 5 days ago. She also noted wheezing, dry cough, low-grade fever. She was seen by primary care physician, noted to be hypoxic, was sent to emergency room for further evaluation. Her oxygen saturations were 88-89% on room air, patient was tachypneic with exertion, she was placed on 2 L of oxygen through nasal cannula and admitted to the hospital. Chest x-ray revealed left lower lobe atelectasis. Chest CT angiogram showed a basal atelectasis, no PE. Labs revealed leukopenia, influenza A test by PCR was positive.  Patient was initiated on Tamiflu, antibiotic therapy, inhalation therapy, steroids, nebulizers, she clinically improved, however, was difficult to wean off oxygen. It was felt that patient's hypoxia could have been related to atelectasis, possible pneumonia,  history of umphalocele, influenza, also possibly fluid overload with therapy. She clinically improved and was stable to be discharged home. She was ambulated around nursing station without oxygen, on room air and her oxygen saturations were stable, dropping down, not lower than 90%. Discussion by problem: #1. Acute respiratory failure with hypoxia due to influenza, acute bronchitis versus basilar pneumonia. Patient finished Tamiflu, Z-Pak courses . She was continued on nebulizers, steroids in the hospital, improved significantly. CT angiogram of the chest revealed atelectasis, questionable pneumonia, patient was advised to continue incentive spirometry. She was noted to have lower extremity swelling and diminished breath sounds bilaterally in the lungs, concerning for fluid overload, she was given Lasix intravenously, diuresed, and her oxygenation improved. It is recommended to follow patient's echocardiogram as outpatient #2. Diabetes mellitus type 2, hemoglobin A1c 7.6, patient is to continue metformin as outpatient, diabetic diet #3. Paroxysmal atrial fibrillation, now in sinus rhythm, rate controlled, patient is to continue amiodarone, metoprolol, Eliquis. #4. Diarrhea, unclear etiology, likely viral gastroenteritis,  stool studies for C. difficile, gastrointestinal panel was negative for organism, symptoms resolved, patient was  given a few doses of cholestyramine while in the hospital, now discontinued #5 hypothyroidism, patient is to continue Synthroid, TSH was 0.5 #6. Anxiety, patient is to continue Xanax as needed #7. Influenza a, patient finished Tamiflu course  #8. Acute bronchitis versus basilar pneumonia, patient finished Zithromax course     VITAL SIGNS:  Blood pressure 138/73, pulse 68, temperature 98.6 F (37 C), resp. rate 18, height 5' 7"  (1.702 m), weight 100.3 kg (221 lb 1.6 oz), SpO2 90 %.  I/O:   Intake/Output Summary (Last 24 hours) at 04/03/16 1426 Last data filed at  04/03/16 1259  Gross per 24 hour  Intake              580 ml  Output              900 ml  Net             -320 ml    PHYSICAL EXAMINATION:  GENERAL:  61 y.o.-year-old patient lying in the bed with no acute distress.  EYES: Pupils equal, round, reactive to light and accommodation. No scleral icterus. Extraocular muscles intact.  HEENT: Head atraumatic, normocephalic. Oropharynx and nasopharynx clear.  NECK:  Supple, no jugular venous distention. No thyroid enlargement, no tenderness.  LUNGS:Diminished breath  sounds bilaterally, no wheezing, rales,rhonchi or crepitation. No use of accessory muscles of respiration.  CARDIOVASCULAR: S1, S2 normal. No murmurs, rubs, or gallops.  ABDOMEN: Soft, non-tender, non-distended. Bowel sounds present. No organomegaly or mass.  EXTREMITIES:1-2+ lower extremity andl edema, . No cyanosis, or clubbing.  NEUROLOGIC: Cranial nerves II through XII are intact. Muscle strength 5/5 in all extremities. Sensation intact. Gait not checked.  PSYCHIATRIC: The patient is alert and oriented x 3.  SKIN: No obvious rash, lesion, or ulcer.   DATA REVIEW:   CBC  Recent Labs Lab 04/03/16 0530  WBC 6.8  HGB 12.7  HCT 37.4  PLT 256    Chemistries   Recent Labs Lab 03/29/16 1636 03/30/16 0600 04/03/16 0530  NA 139 141  --   K 3.5 3.8  --   CL 97* 102  --   CO2 31 29  --   GLUCOSE 141* 253*  --   BUN 11 13  --   CREATININE 0.58 0.58 0.70  CALCIUM 9.8 9.2  --   AST 28  --   --   ALT 33  --   --   ALKPHOS 62  --   --   BILITOT 0.6  --   --     Cardiac Enzymes No results for input(s): TROPONINI in the last 168 hours.  Microbiology Results  Results for orders placed or performed during the hospital encounter of 03/29/16  C difficile quick scan w PCR reflex     Status: None   Collection Time: 03/29/16  4:53 AM  Result Value Ref Range Status   C Diff antigen NEGATIVE NEGATIVE Final   C Diff toxin NEGATIVE NEGATIVE Final   C Diff interpretation No  C. difficile detected.  Final  Gastrointestinal Panel by PCR , Stool     Status: None   Collection Time: 03/29/16  4:53 AM  Result Value Ref Range Status   Campylobacter species NOT DETECTED NOT DETECTED Final   Plesimonas shigelloides NOT DETECTED NOT DETECTED Final   Salmonella species NOT DETECTED NOT DETECTED Final   Yersinia enterocolitica NOT DETECTED NOT DETECTED Final   Vibrio species NOT DETECTED NOT DETECTED Final   Vibrio cholerae NOT DETECTED NOT  DETECTED Final   Enteroaggregative E coli (EAEC) NOT DETECTED NOT DETECTED Final   Enteropathogenic E coli (EPEC) NOT DETECTED NOT DETECTED Final   Enterotoxigenic E coli (ETEC) NOT DETECTED NOT DETECTED Final   Shiga like toxin producing E coli (STEC) NOT DETECTED NOT DETECTED Final   Shigella/Enteroinvasive E coli (EIEC) NOT DETECTED NOT DETECTED Final   Cryptosporidium NOT DETECTED NOT DETECTED Final   Cyclospora cayetanensis NOT DETECTED NOT DETECTED Final   Entamoeba histolytica NOT DETECTED NOT DETECTED Final   Giardia lamblia NOT DETECTED NOT DETECTED Final   Adenovirus F40/41 NOT DETECTED NOT DETECTED Final   Astrovirus NOT DETECTED NOT DETECTED Final   Norovirus GI/GII NOT DETECTED NOT DETECTED Final   Rotavirus A NOT DETECTED NOT DETECTED Final   Sapovirus (I, II, IV, and V) NOT DETECTED NOT DETECTED Final    RADIOLOGY:  No results found.  EKG:   Orders placed or performed during the hospital encounter of 03/29/16  . EKG 12-Lead  . EKG 12-Lead  . ED EKG  . ED EKG      Management plans discussed with the patient, family and they are in agreement.  CODE STATUS:     Code Status Orders        Start     Ordered   03/29/16 2245  Full code  Continuous     03/29/16 2245    Code Status History    Date Active Date Inactive Code Status Order ID Comments User Context   This patient has a current code status but no historical code status.    Advance Directive Documentation   Flowsheet Row Most Recent Value   Type of Advance Directive  Healthcare Power of Attorney, Living will  Pre-existing out of facility DNR order (yellow form or pink MOST form)  No data  "MOST" Form in Place?  No data      TOTAL TIME TAKING CARE OF THIS PATIENT: 40  minutes.    Theodoro Grist M.D on 04/03/2016 at 2:26 PM  Between 7am to 6pm - Pager - [phone]  After 6pm go to [email or web address] - password [email or web address] [facility]  [facility]  Office  [phone]  CC: Primary care physician; [name], MD

## 2016-04-03 NOTE — Care Management Important Message (Signed)
Important Message  Patient Details  [name] MRN: [id number] Date of Birth: November 03, 1955   Medicare Important Message Given:  Yes    Beverly Sessions, RN 04/03/2016, 1:29 PM

## 2016-04-10 ENCOUNTER — Ambulatory Visit (INDEPENDENT_AMBULATORY_CARE_PROVIDER_SITE_OTHER): Payer: PPO | Admitting: Internal Medicine

## 2016-04-10 VITALS — BP 118/68 | HR 85 | Temp 97.8°F | Resp 16 | Ht 67.0 in | Wt 220.0 lb

## 2016-04-10 DIAGNOSIS — I10 Essential (primary) hypertension: Secondary | ICD-10-CM | POA: Diagnosis not present

## 2016-04-10 DIAGNOSIS — F418 Other specified anxiety disorders: Secondary | ICD-10-CM

## 2016-04-10 DIAGNOSIS — I480 Paroxysmal atrial fibrillation: Secondary | ICD-10-CM

## 2016-04-10 DIAGNOSIS — F320 Major depressive disorder, single episode, mild: Secondary | ICD-10-CM

## 2016-04-10 DIAGNOSIS — E039 Hypothyroidism, unspecified: Secondary | ICD-10-CM

## 2016-04-10 DIAGNOSIS — F5105 Insomnia due to other mental disorder: Secondary | ICD-10-CM | POA: Diagnosis not present

## 2016-04-10 DIAGNOSIS — F409 Phobic anxiety disorder, unspecified: Secondary | ICD-10-CM | POA: Diagnosis not present

## 2016-04-10 DIAGNOSIS — E538 Deficiency of other specified B group vitamins: Secondary | ICD-10-CM

## 2016-04-10 DIAGNOSIS — E118 Type 2 diabetes mellitus with unspecified complications: Secondary | ICD-10-CM | POA: Diagnosis not present

## 2016-04-10 DIAGNOSIS — E038 Other specified hypothyroidism: Secondary | ICD-10-CM

## 2016-04-10 DIAGNOSIS — F411 Generalized anxiety disorder: Secondary | ICD-10-CM | POA: Diagnosis not present

## 2016-04-10 MED ORDER — ZOLPIDEM TARTRATE 10 MG PO TABS: ORAL_TABLET | ORAL | 1 refills | Status: DC

## 2016-04-10 MED ORDER — LEVOTHYROXINE SODIUM 88 MCG PO TABS: ORAL_TABLET | ORAL | 1 refills | Status: DC

## 2016-04-10 MED ORDER — APIXABAN 5 MG PO TABS: 5.0000 mg | ORAL_TABLET | Freq: Two times a day (BID) | ORAL | 1 refills | Status: DC

## 2016-04-10 MED ORDER — CYANOCOBALAMIN 1000 MCG/ML IJ SOLN
1000.0000 ug | Freq: Once | INTRAMUSCULAR | Status: AC
  Administered 2016-04-10: 1000 ug via INTRAMUSCULAR

## 2016-04-10 MED ORDER — PAROXETINE HCL ER 25 MG PO TB24
25.0000 mg | ORAL_TABLET | Freq: Every day | ORAL | 3 refills | Status: DC
Start: 2016-04-10 — End: 2017-01-01

## 2016-04-10 MED ORDER — ALPRAZOLAM 0.25 MG PO TABS: ORAL_TABLET | ORAL | 2 refills | Status: DC

## 2016-04-10 MED ORDER — METFORMIN HCL 1000 MG PO TABS: ORAL_TABLET | ORAL | 1 refills | Status: DC

## 2016-04-10 NOTE — Progress Notes (Signed)
Pre visit review using our clinic review tool, if applicable. No additional management support is needed unless otherwise documented below in the visit note. 

## 2016-04-10 NOTE — Progress Notes (Signed)
Subjective:  Patient ID: Kirsten [name], female    DOB: 1956-01-16  Age: 61 y.o. MRN: [id number]  CC: Hypertension; Hypothyroidism; and Diabetes   HPI Kirsten [name] presents for f/up - She was recently admitted for influenza A at another hospital and had severe complications and was an inpatient for 6 days. She has recovered from this and has had no recent episodes of coughing or shortness of breath. While she was admitted her TSH was 0.54 and her A1c was 7.6. She has had no recent episodes of palpitations, edema, fatigue, diarrhea, or constipation.  Outpatient Medications Prior to Visit  Medication Sig Dispense Refill  . albuterol (PROVENTIL HFA;VENTOLIN HFA) 108 (90 Base) MCG/ACT inhaler Inhale 2 puffs into the lungs every 6 (six) hours as needed for wheezing or shortness of breath. 1 Inhaler 0  . amiodarone (PACERONE) 200 MG tablet Take 200 mg by mouth daily.    . cetirizine (ZYRTEC) 10 MG tablet Take 1 tablet (10 mg total) by mouth daily. 30 tablet 5  . glucose blood (ONE TOUCH ULTRA TEST) test strip Use as instructed 100 each 12  . metoprolol succinate (TOPROL-XL) 50 MG 24 hr tablet Take 50 mg by mouth daily.     Marland Kitchen ALPRAZolam (XANAX) 0.25 MG tablet take 1 tablet by mouth once daily if needed 90 tablet 1  . apixaban (ELIQUIS) 5 MG TABS tablet Take 5 mg by mouth 2 (two) times daily.    . hydrochlorothiazide (HYDRODIURIL) 25 MG tablet Take 1 tablet (25 mg total) by mouth daily. 90 tablet 3  . metFORMIN (GLUCOPHAGE) 1000 MG tablet take 1 tablet by mouth twice a day with A MEAL 180 tablet 1  . PARoxetine (PAXIL-CR) 25 MG 24 hr tablet take 1 tablet by mouth once daily 90 tablet 3  . SYNTHROID 88 MCG tablet take 1 tablet by mouth once daily BEFORE BREAKFAST 90 tablet 1  . zolpidem (AMBIEN) 10 MG tablet take 1 tablet by mouth at bedtime if needed 90 tablet 1   Facility-Administered Medications Prior to Visit  Medication Dose Route Frequency Provider Last Rate Last Dose  . cyanocobalamin  ((VITAMIN B-12)) injection 1,000 mcg  1,000 mcg Intramuscular Once [name], MD        ROS Review of Systems  Constitutional: Negative for activity change, chills, diaphoresis, fatigue, fever and unexpected weight change.  HENT: Negative.  Negative for facial swelling, sinus pressure, sore throat and trouble swallowing.   Eyes: Negative.  Negative for visual disturbance.  Respiratory: Negative.  Negative for cough, chest tightness, shortness of breath, wheezing and stridor.   Cardiovascular: Negative.  Negative for chest pain, palpitations and leg swelling.  Gastrointestinal: Negative.  Negative for abdominal pain, blood in stool, constipation, diarrhea, nausea and vomiting.  Endocrine: Negative.  Negative for cold intolerance, heat intolerance, polydipsia, polyphagia and polyuria.  Genitourinary: Negative.   Musculoskeletal: Negative.  Negative for back pain and neck pain.  Skin: Negative.  Negative for color change and rash.  Allergic/Immunologic: Negative.   Neurological: Negative.  Negative for dizziness and weakness.  Hematological: Negative for adenopathy. Does not bruise/bleed easily.  Psychiatric/Behavioral: Positive for sleep disturbance. Negative for confusion, decreased concentration, dysphoric mood, self-injury and suicidal ideas. The patient is nervous/anxious.     Objective:  BP 118/68 (BP Location: Left Arm, Patient Position: Sitting, Cuff Size: Large)   Pulse 85   Temp 97.8 F (36.6 C) (Oral)   Resp 16   Ht 5' 7"  (1.702 m)   Wt  220 lb (99.8 kg)   SpO2 95%   BMI 34.46 kg/m   BP Readings from Last 3 Encounters:  04/10/16 118/68  04/03/16 138/73  03/29/16 114/76    Wt Readings from Last 3 Encounters:  04/10/16 220 lb (99.8 kg)  03/29/16 221 lb 1.6 oz (100.3 kg)  03/29/16 220 lb (99.8 kg)    Physical Exam  Constitutional: No distress.  HENT:  Mouth/Throat: Oropharynx is clear and moist. No oropharyngeal exudate.  Eyes: Conjunctivae are normal. Right  eye exhibits no discharge. Left eye exhibits no discharge. No scleral icterus.  Neck: Normal range of motion. Neck supple. No JVD present. No tracheal deviation present. No thyromegaly present.  Cardiovascular: Normal rate, normal heart sounds and intact distal pulses.  Exam reveals no gallop and no friction rub.   No murmur heard. Pulmonary/Chest: Effort normal and breath sounds normal. No respiratory distress. She has no wheezes. She has no rales. She exhibits no tenderness.  Abdominal: Bowel sounds are normal. She exhibits no distension and no mass. There is no tenderness. There is no rebound and no guarding.  Musculoskeletal: Normal range of motion. She exhibits no edema, tenderness or deformity.  Skin: Skin is warm and dry. No rash noted. She is not diaphoretic. No erythema.  Psychiatric: She has a normal mood and affect. Her behavior is normal. Judgment and thought content normal.  Vitals reviewed.   Lab Results  Component Value Date   WBC 6.8 04/03/2016   HGB 12.7 04/03/2016   HCT 37.4 04/03/2016   PLT 256 04/03/2016   GLUCOSE 253 (H) 03/30/2016   CHOL 201 (H) 07/18/2015   TRIG 336.0 (H) 07/18/2015   HDL 39.20 07/18/2015   LDLDIRECT 111.0 07/18/2015   LDLCALC SEE COMMENT 11/26/2013   ALT 33 03/29/2016   AST 28 03/29/2016   NA 141 03/30/2016   K 3.8 03/30/2016   CL 102 03/30/2016   CREATININE 0.70 04/03/2016   BUN 13 03/30/2016   CO2 29 03/30/2016   TSH 0.540 03/31/2016   INR 1.1 11/27/2013   HGBA1C 7.6 (H) 03/29/2016   MICROALBUR <0.7 07/18/2015    Dg Chest 2 View  Result Date: 03/29/2016 CLINICAL DATA:  Dysnea EXAM: CHEST  2 VIEW COMPARISON:  01/02/2016, 10/07/2015 FINDINGS: The heart size and mediastinal contours stable in appearance. No aortic aneurysm. Borderline cardiomegaly. Trace edema along the right major fissure. Subsegmental atelectasis at the left lung base. Tiny 2 mm nodular density overlying the right posterior fifth rib is new since prior exam and may  reflect a pulmonary vessel on end or potentially tiny nodule. No overt pulmonary edema. Eventration of the right hemidiaphragm is stable. The visualized skeletal structures are unremarkable. IMPRESSION: No active cardiopulmonary disease. Platelike atelectasis at the left lung base. Tiny nodular density overlying the right upper lobe may reflect pulmonary vessel on end pulmonary nodule or summation shadow. It is unchanged in appearance when compared with recent comparison. Electronically Signed   By: Ashley Royalty M.D.   On: 03/29/2016 17:49    Assessment & Plan:   Cruzita was seen today for hypertension, hypothyroidism and diabetes.  Diagnoses and all orders for this visit:  Essential hypertension, benign- BP is well controlled, recent lytes and renal fxn normal  Paroxysmal atrial fibrillation ([place])- she has good rate and rhythm control, will cont eliquis -     apixaban (ELIQUIS) 5 MG TABS tablet; Take 1 tablet (5 mg total) by mouth 2 (two) times daily.  Acquired hypothyroidism- will stay on the current  T4 dose -     levothyroxine (SYNTHROID) 88 MCG tablet; take 1 tablet by mouth once daily BEFORE BREAKFAST  Type 2 diabetes mellitus with complication, without long-term current use of insulin (HCC)- her blood sugar is adequately well controlled -     metFORMIN (GLUCOPHAGE) 1000 MG tablet; take 1 tablet by mouth twice a day with A MEAL  GAD (generalized anxiety disorder) -     ALPRAZolam (XANAX) 0.25 MG tablet; take 1 tablet by mouth once daily if needed -     PARoxetine (PAXIL-CR) 25 MG 24 hr tablet; Take 1 tablet (25 mg total) by mouth daily.  Depression with anxiety -     ALPRAZolam (XANAX) 0.25 MG tablet; take 1 tablet by mouth once daily if needed  Insomnia due to anxiety and fear -     zolpidem (AMBIEN) 10 MG tablet; take 1 tablet by mouth at bedtime if needed  B12 deficiency -     cyanocobalamin ((VITAMIN B-12)) injection 1,000 mcg; Inject 1 mL (1,000 mcg total) into the muscle  once.  Other specified hypothyroidism  Mild single current episode of major depressive disorder (HCC) -     PARoxetine (PAXIL-CR) 25 MG 24 hr tablet; Take 1 tablet (25 mg total) by mouth daily.   I have discontinued Ms. Bubolz's hydrochlorothiazide. I have changed her SYNTHROID to levothyroxine. I have also changed her apixaban and PARoxetine. Additionally, I am having her maintain her glucose blood, amiodarone, albuterol, metoprolol succinate, cetirizine, ALPRAZolam, zolpidem, and metFORMIN. We administered cyanocobalamin. We will continue to administer cyanocobalamin.  Meds ordered this encounter  Medications  . ALPRAZolam (XANAX) 0.25 MG tablet    Sig: take 1 tablet by mouth once daily if needed    Dispense:  90 tablet    Refill:  2  . zolpidem (AMBIEN) 10 MG tablet    Sig: take 1 tablet by mouth at bedtime if needed    Dispense:  90 tablet    Refill:  1  . cyanocobalamin ((VITAMIN B-12)) injection 1,000 mcg  . apixaban (ELIQUIS) 5 MG TABS tablet    Sig: Take 1 tablet (5 mg total) by mouth 2 (two) times daily.    Dispense:  180 tablet    Refill:  1  . levothyroxine (SYNTHROID) 88 MCG tablet    Sig: take 1 tablet by mouth once daily BEFORE BREAKFAST    Dispense:  90 tablet    Refill:  1  . PARoxetine (PAXIL-CR) 25 MG 24 hr tablet    Sig: Take 1 tablet (25 mg total) by mouth daily.    Dispense:  90 tablet    Refill:  3  . metFORMIN (GLUCOPHAGE) 1000 MG tablet    Sig: take 1 tablet by mouth twice a day with A MEAL    Dispense:  180 tablet    Refill:  1     Follow-up: Return in about 4 months (around 08/08/2016).  [name], MD

## 2016-04-10 NOTE — Patient Instructions (Signed)

## 2016-04-11 ENCOUNTER — Encounter: Payer: Self-pay | Admitting: Internal Medicine

## 2016-05-02 ENCOUNTER — Other Ambulatory Visit: Payer: Self-pay | Admitting: Internal Medicine

## 2016-05-03 ENCOUNTER — Encounter: Payer: Self-pay | Admitting: Obstetrics and Gynecology

## 2016-05-04 ENCOUNTER — Other Ambulatory Visit: Payer: Self-pay

## 2016-05-04 MED ORDER — NYSTATIN-TRIAMCINOLONE 100000-0.1 UNIT/GM-% EX CREA: 1.0000 "application " | TOPICAL_CREAM | Freq: Two times a day (BID) | CUTANEOUS | 0 refills | Status: DC

## 2016-05-07 ENCOUNTER — Other Ambulatory Visit: Payer: Self-pay

## 2016-05-07 MED ORDER — NYSTATIN 100000 UNIT/GM EX OINT: 1.0000 "application " | TOPICAL_OINTMENT | Freq: Two times a day (BID) | CUTANEOUS | 0 refills | Status: DC

## 2016-05-07 MED ORDER — TRIAMCINOLONE ACETONIDE 0.1 % EX OINT: 1.0000 "application " | TOPICAL_OINTMENT | Freq: Two times a day (BID) | CUTANEOUS | 0 refills | Status: DC

## 2016-05-29 DIAGNOSIS — M5010 Cervical disc disorder with radiculopathy, unspecified cervical region: Secondary | ICD-10-CM | POA: Diagnosis not present

## 2016-05-29 DIAGNOSIS — M9901 Segmental and somatic dysfunction of cervical region: Secondary | ICD-10-CM | POA: Diagnosis not present

## 2016-05-29 DIAGNOSIS — M9903 Segmental and somatic dysfunction of lumbar region: Secondary | ICD-10-CM | POA: Diagnosis not present

## 2016-05-29 DIAGNOSIS — R51 Headache: Secondary | ICD-10-CM | POA: Diagnosis not present

## 2016-05-31 DIAGNOSIS — R51 Headache: Secondary | ICD-10-CM | POA: Diagnosis not present

## 2016-05-31 DIAGNOSIS — M9903 Segmental and somatic dysfunction of lumbar region: Secondary | ICD-10-CM | POA: Diagnosis not present

## 2016-05-31 DIAGNOSIS — M5010 Cervical disc disorder with radiculopathy, unspecified cervical region: Secondary | ICD-10-CM | POA: Diagnosis not present

## 2016-05-31 DIAGNOSIS — M9901 Segmental and somatic dysfunction of cervical region: Secondary | ICD-10-CM | POA: Diagnosis not present

## 2016-06-04 DIAGNOSIS — R51 Headache: Secondary | ICD-10-CM | POA: Diagnosis not present

## 2016-06-04 DIAGNOSIS — M9903 Segmental and somatic dysfunction of lumbar region: Secondary | ICD-10-CM | POA: Diagnosis not present

## 2016-06-04 DIAGNOSIS — M5010 Cervical disc disorder with radiculopathy, unspecified cervical region: Secondary | ICD-10-CM | POA: Diagnosis not present

## 2016-06-04 DIAGNOSIS — M9901 Segmental and somatic dysfunction of cervical region: Secondary | ICD-10-CM | POA: Diagnosis not present

## 2016-06-05 DIAGNOSIS — G4733 Obstructive sleep apnea (adult) (pediatric): Secondary | ICD-10-CM | POA: Diagnosis not present

## 2016-06-07 DIAGNOSIS — M9903 Segmental and somatic dysfunction of lumbar region: Secondary | ICD-10-CM | POA: Diagnosis not present

## 2016-06-07 DIAGNOSIS — R51 Headache: Secondary | ICD-10-CM | POA: Diagnosis not present

## 2016-06-07 DIAGNOSIS — M5010 Cervical disc disorder with radiculopathy, unspecified cervical region: Secondary | ICD-10-CM | POA: Diagnosis not present

## 2016-06-07 DIAGNOSIS — M9901 Segmental and somatic dysfunction of cervical region: Secondary | ICD-10-CM | POA: Diagnosis not present

## 2016-06-11 DIAGNOSIS — M9901 Segmental and somatic dysfunction of cervical region: Secondary | ICD-10-CM | POA: Diagnosis not present

## 2016-06-11 DIAGNOSIS — M5010 Cervical disc disorder with radiculopathy, unspecified cervical region: Secondary | ICD-10-CM | POA: Diagnosis not present

## 2016-06-11 DIAGNOSIS — M9903 Segmental and somatic dysfunction of lumbar region: Secondary | ICD-10-CM | POA: Diagnosis not present

## 2016-06-11 DIAGNOSIS — R51 Headache: Secondary | ICD-10-CM | POA: Diagnosis not present

## 2016-06-12 DIAGNOSIS — R203 Hyperesthesia: Secondary | ICD-10-CM | POA: Diagnosis not present

## 2016-06-12 DIAGNOSIS — R21 Rash and other nonspecific skin eruption: Secondary | ICD-10-CM | POA: Diagnosis not present

## 2016-06-12 DIAGNOSIS — M9903 Segmental and somatic dysfunction of lumbar region: Secondary | ICD-10-CM | POA: Diagnosis not present

## 2016-06-12 DIAGNOSIS — M5010 Cervical disc disorder with radiculopathy, unspecified cervical region: Secondary | ICD-10-CM | POA: Diagnosis not present

## 2016-06-12 DIAGNOSIS — M9901 Segmental and somatic dysfunction of cervical region: Secondary | ICD-10-CM | POA: Diagnosis not present

## 2016-06-12 DIAGNOSIS — R51 Headache: Secondary | ICD-10-CM | POA: Diagnosis not present

## 2016-06-12 DIAGNOSIS — B029 Zoster without complications: Secondary | ICD-10-CM | POA: Diagnosis not present

## 2016-07-02 DIAGNOSIS — M9901 Segmental and somatic dysfunction of cervical region: Secondary | ICD-10-CM | POA: Diagnosis not present

## 2016-07-02 DIAGNOSIS — M5010 Cervical disc disorder with radiculopathy, unspecified cervical region: Secondary | ICD-10-CM | POA: Diagnosis not present

## 2016-07-02 DIAGNOSIS — R51 Headache: Secondary | ICD-10-CM | POA: Diagnosis not present

## 2016-07-02 DIAGNOSIS — M9903 Segmental and somatic dysfunction of lumbar region: Secondary | ICD-10-CM | POA: Diagnosis not present

## 2016-07-03 DIAGNOSIS — I2510 Atherosclerotic heart disease of native coronary artery without angina pectoris: Secondary | ICD-10-CM | POA: Diagnosis not present

## 2016-07-03 DIAGNOSIS — R0602 Shortness of breath: Secondary | ICD-10-CM | POA: Diagnosis not present

## 2016-07-03 DIAGNOSIS — I10 Essential (primary) hypertension: Secondary | ICD-10-CM | POA: Diagnosis not present

## 2016-07-03 DIAGNOSIS — I4891 Unspecified atrial fibrillation: Secondary | ICD-10-CM | POA: Diagnosis not present

## 2016-07-04 DIAGNOSIS — R51 Headache: Secondary | ICD-10-CM | POA: Diagnosis not present

## 2016-07-04 DIAGNOSIS — M5010 Cervical disc disorder with radiculopathy, unspecified cervical region: Secondary | ICD-10-CM | POA: Diagnosis not present

## 2016-07-04 DIAGNOSIS — M9903 Segmental and somatic dysfunction of lumbar region: Secondary | ICD-10-CM | POA: Diagnosis not present

## 2016-07-04 DIAGNOSIS — M9901 Segmental and somatic dysfunction of cervical region: Secondary | ICD-10-CM | POA: Diagnosis not present

## 2016-07-11 DIAGNOSIS — R51 Headache: Secondary | ICD-10-CM | POA: Diagnosis not present

## 2016-07-11 DIAGNOSIS — M5010 Cervical disc disorder with radiculopathy, unspecified cervical region: Secondary | ICD-10-CM | POA: Diagnosis not present

## 2016-07-11 DIAGNOSIS — M9903 Segmental and somatic dysfunction of lumbar region: Secondary | ICD-10-CM | POA: Diagnosis not present

## 2016-07-11 DIAGNOSIS — M9901 Segmental and somatic dysfunction of cervical region: Secondary | ICD-10-CM | POA: Diagnosis not present

## 2016-07-31 LAB — HM DIABETES EYE EXAM

## 2016-08-08 ENCOUNTER — Ambulatory Visit: Payer: PPO | Admitting: Internal Medicine

## 2016-09-10 ENCOUNTER — Encounter: Payer: Self-pay | Admitting: Internal Medicine

## 2016-09-10 ENCOUNTER — Other Ambulatory Visit (INDEPENDENT_AMBULATORY_CARE_PROVIDER_SITE_OTHER): Payer: PPO

## 2016-09-10 ENCOUNTER — Ambulatory Visit (INDEPENDENT_AMBULATORY_CARE_PROVIDER_SITE_OTHER): Payer: PPO | Admitting: Internal Medicine

## 2016-09-10 VITALS — BP 124/80 | HR 67 | Temp 98.3°F | Resp 16 | Ht 67.0 in | Wt 219.0 lb

## 2016-09-10 DIAGNOSIS — E785 Hyperlipidemia, unspecified: Secondary | ICD-10-CM

## 2016-09-10 DIAGNOSIS — E118 Type 2 diabetes mellitus with unspecified complications: Secondary | ICD-10-CM

## 2016-09-10 DIAGNOSIS — E66812 Obesity, class 2: Secondary | ICD-10-CM

## 2016-09-10 DIAGNOSIS — I480 Paroxysmal atrial fibrillation: Secondary | ICD-10-CM

## 2016-09-10 DIAGNOSIS — M25511 Pain in right shoulder: Secondary | ICD-10-CM

## 2016-09-10 DIAGNOSIS — E781 Pure hyperglyceridemia: Secondary | ICD-10-CM | POA: Diagnosis not present

## 2016-09-10 DIAGNOSIS — Z6835 Body mass index (BMI) 35.0-35.9, adult: Secondary | ICD-10-CM | POA: Diagnosis not present

## 2016-09-10 DIAGNOSIS — F418 Other specified anxiety disorders: Secondary | ICD-10-CM | POA: Diagnosis not present

## 2016-09-10 DIAGNOSIS — I10 Essential (primary) hypertension: Secondary | ICD-10-CM | POA: Diagnosis not present

## 2016-09-10 DIAGNOSIS — F5105 Insomnia due to other mental disorder: Secondary | ICD-10-CM

## 2016-09-10 DIAGNOSIS — E039 Hypothyroidism, unspecified: Secondary | ICD-10-CM

## 2016-09-10 DIAGNOSIS — F411 Generalized anxiety disorder: Secondary | ICD-10-CM | POA: Diagnosis not present

## 2016-09-10 DIAGNOSIS — G8929 Other chronic pain: Secondary | ICD-10-CM

## 2016-09-10 DIAGNOSIS — F409 Phobic anxiety disorder, unspecified: Secondary | ICD-10-CM

## 2016-09-10 LAB — URINALYSIS, ROUTINE W REFLEX MICROSCOPIC
Bilirubin Urine: NEGATIVE
HGB URINE DIPSTICK: NEGATIVE
Ketones, ur: NEGATIVE
Leukocytes, UA: NEGATIVE
NITRITE: NEGATIVE
RBC / HPF: NONE SEEN (ref 0–?)
Total Protein, Urine: NEGATIVE
URINE GLUCOSE: NEGATIVE
Urobilinogen, UA: 0.2 (ref 0.0–1.0)
pH: 6 (ref 5.0–8.0)

## 2016-09-10 LAB — HEMOGLOBIN A1C: Hgb A1c MFr Bld: 7.3 % — ABNORMAL HIGH (ref 4.6–6.5)

## 2016-09-10 LAB — CBC WITH DIFFERENTIAL/PLATELET
BASOS PCT: 0.8 % (ref 0.0–3.0)
Basophils Absolute: 0.1 10*3/uL (ref 0.0–0.1)
EOS ABS: 0.2 10*3/uL (ref 0.0–0.7)
EOS PCT: 2.5 % (ref 0.0–5.0)
HCT: 45 % (ref 36.0–46.0)
Hemoglobin: 14.8 g/dL (ref 12.0–15.0)
LYMPHS ABS: 3.1 10*3/uL (ref 0.7–4.0)
Lymphocytes Relative: 34.7 % (ref 12.0–46.0)
MCHC: 32.9 g/dL (ref 30.0–36.0)
MCV: 90.6 fl (ref 78.0–100.0)
MONO ABS: 0.6 10*3/uL (ref 0.1–1.0)
Monocytes Relative: 6.2 % (ref 3.0–12.0)
NEUTROS ABS: 5 10*3/uL (ref 1.4–7.7)
Neutrophils Relative %: 55.8 % (ref 43.0–77.0)
PLATELETS: 300 10*3/uL (ref 150.0–400.0)
RBC: 4.97 Mil/uL (ref 3.87–5.11)
RDW: 13.9 % (ref 11.5–15.5)
WBC: 9 10*3/uL (ref 4.0–10.5)

## 2016-09-10 LAB — COMPREHENSIVE METABOLIC PANEL
ALBUMIN: 4.5 g/dL (ref 3.5–5.2)
ALT: 28 U/L (ref 0–35)
AST: 22 U/L (ref 0–37)
Alkaline Phosphatase: 66 U/L (ref 39–117)
BUN: 11 mg/dL (ref 6–23)
CHLORIDE: 99 meq/L (ref 96–112)
CO2: 32 meq/L (ref 19–32)
CREATININE: 0.74 mg/dL (ref 0.40–1.20)
Calcium: 10.3 mg/dL (ref 8.4–10.5)
GFR: 85.34 mL/min (ref >60.00)
GLUCOSE: 129 mg/dL — AB (ref 70–99)
Potassium: 4 mEq/L (ref 3.5–5.1)
Sodium: 141 mEq/L (ref 135–145)
Total Bilirubin: 0.5 mg/dL (ref 0.2–1.2)
Total Protein: 7.2 g/dL (ref 6.0–8.3)

## 2016-09-10 LAB — LIPID PANEL
CHOL/HDL RATIO: 5
CHOLESTEROL: 222 mg/dL — AB (ref 0–200)
HDL: 47 mg/dL (ref >39.00)
NonHDL: 175.07
Triglycerides: 243 mg/dL — ABNORMAL HIGH (ref 0.0–149.0)
VLDL: 48.6 mg/dL — AB (ref 0.0–40.0)

## 2016-09-10 LAB — MICROALBUMIN / CREATININE URINE RATIO
CREATININE, U: 37.4 mg/dL
MICROALB/CREAT RATIO: 1.9 mg/g (ref 0.0–30.0)

## 2016-09-10 LAB — LDL CHOLESTEROL, DIRECT: LDL DIRECT: 139 mg/dL

## 2016-09-10 MED ORDER — ALPRAZOLAM 0.25 MG PO TABS: ORAL_TABLET | ORAL | 2 refills | Status: DC

## 2016-09-10 MED ORDER — ZOLPIDEM TARTRATE 10 MG PO TABS: ORAL_TABLET | ORAL | 1 refills | Status: DC

## 2016-09-10 NOTE — Progress Notes (Signed)
Subjective:  Patient ID: Kirsten [name], female    DOB: 07/28/1955  Age: 61 y.o. MRN: [id number]  CC: Hyperlipidemia; Hypothyroidism; Diabetes; and Shoulder Pain   HPI Taline Nass Venson presents for f/up - She complains of chronic right shoulder pain and decreased range of motion with no recent trauma or injury. She complains of anxiety and insomnia and wants refills on Xanax and Ambien.  Outpatient Medications Prior to Visit  Medication Sig Dispense Refill  . albuterol (PROVENTIL HFA;VENTOLIN HFA) 108 (90 Base) MCG/ACT inhaler Inhale 2 puffs into the lungs every 6 (six) hours as needed for wheezing or shortness of breath. 1 Inhaler 0  . amiodarone (PACERONE) 200 MG tablet Take 200 mg by mouth daily.    Marland Kitchen apixaban (ELIQUIS) 5 MG TABS tablet Take 1 tablet (5 mg total) by mouth 2 (two) times daily. 180 tablet 1  . cetirizine (ZYRTEC) 10 MG tablet Take 1 tablet (10 mg total) by mouth daily. 30 tablet 5  . glucose blood (ONE TOUCH ULTRA TEST) test strip Use as instructed 100 each 12  . hydrochlorothiazide (HYDRODIURIL) 25 MG tablet take 1 tablet by mouth once daily 90 tablet 1  . levothyroxine (SYNTHROID) 88 MCG tablet take 1 tablet by mouth once daily BEFORE BREAKFAST 90 tablet 1  . metFORMIN (GLUCOPHAGE) 1000 MG tablet take 1 tablet by mouth twice a day with A MEAL 180 tablet 1  . metoprolol succinate (TOPROL-XL) 50 MG 24 hr tablet Take 50 mg by mouth daily.     Marland Kitchen PARoxetine (PAXIL-CR) 25 MG 24 hr tablet Take 1 tablet (25 mg total) by mouth daily. 90 tablet 3  . triamcinolone ointment (KENALOG) 0.1 % Apply 1 application topically 2 (two) times daily. 30 g 0  . ALPRAZolam (XANAX) 0.25 MG tablet take 1 tablet by mouth once daily if needed 90 tablet 2  . nystatin ointment (MYCOSTATIN) Apply 1 application topically 2 (two) times daily. 30 g 0  . nystatin-triamcinolone (MYCOLOG II) cream Apply 1 application topically 2 (two) times daily. 30 g 0  . zolpidem (AMBIEN) 10 MG tablet take 1 tablet by  mouth at bedtime if needed 90 tablet 1   Facility-Administered Medications Prior to Visit  Medication Dose Route Frequency Provider Last Rate Last Dose  . cyanocobalamin ((VITAMIN B-12)) injection 1,000 mcg  1,000 mcg Intramuscular Once [name], MD        ROS Review of Systems  Constitutional: Negative.  Negative for appetite change, diaphoresis, fatigue and unexpected weight change.  HENT: Negative.   Eyes: Negative.  Negative for visual disturbance.  Respiratory: Negative.  Negative for cough, chest tightness, shortness of breath and wheezing.   Cardiovascular: Negative for chest pain, palpitations and leg swelling.  Gastrointestinal: Negative for abdominal pain, constipation, diarrhea, nausea and vomiting.  Endocrine: Negative for cold intolerance, heat intolerance, polydipsia, polyphagia and polyuria.  Genitourinary: Negative.  Negative for dysuria and hematuria.  Musculoskeletal: Positive for arthralgias. Negative for back pain and myalgias.  Skin: Negative.  Negative for color change and rash.  Allergic/Immunologic: Negative.   Neurological: Negative.   Hematological: Negative for adenopathy. Does not bruise/bleed easily.  Psychiatric/Behavioral: Positive for sleep disturbance. Negative for confusion, decreased concentration, dysphoric mood, self-injury and suicidal ideas. The patient is nervous/anxious.     Objective:  BP 124/80 (BP Location: Left Arm, Patient Position: Sitting, Cuff Size: Large)   Pulse 67   Temp 98.3 F (36.8 C) (Oral)   Resp 16   Ht 5' 7"  (1.702 m)  Wt 219 lb (99.3 kg)   SpO2 97%   BMI 34.30 kg/m   BP Readings from Last 3 Encounters:  09/10/16 124/80  04/10/16 118/68  04/03/16 138/73    Wt Readings from Last 3 Encounters:  09/10/16 219 lb (99.3 kg)  04/10/16 220 lb (99.8 kg)  03/29/16 221 lb 1.6 oz (100.3 kg)    Physical Exam  Constitutional: She is oriented to person, place, and time. No distress.  HENT:  Mouth/Throat:  Oropharynx is clear and moist. No oropharyngeal exudate.  Eyes: Conjunctivae are normal. Right eye exhibits no discharge. Left eye exhibits no discharge. No scleral icterus.  Neck: Normal range of motion. Neck supple. No JVD present. No thyromegaly present.  Cardiovascular: Normal rate, regular rhythm and intact distal pulses.  Exam reveals no gallop and no friction rub.   No murmur heard. Pulmonary/Chest: Effort normal and breath sounds normal. No respiratory distress. She has no wheezes. She has no rales. She exhibits no tenderness.  Abdominal: Soft. Bowel sounds are normal. She exhibits no distension and no mass. There is no tenderness. There is no rebound and no guarding.  Musculoskeletal: She exhibits no edema or tenderness.       Right shoulder: She exhibits decreased range of motion. She exhibits no tenderness, no bony tenderness, no swelling, no effusion, no crepitus, no deformity and no pain.  Lymphadenopathy:    She has no cervical adenopathy.  Neurological: She is alert and oriented to person, place, and time.  Skin: Skin is warm and dry. No rash noted. She is not diaphoretic. No erythema. No pallor.  Vitals reviewed.   Lab Results  Component Value Date   WBC 9.0 09/10/2016   HGB 14.8 09/10/2016   HCT 45.0 09/10/2016   PLT 300.0 09/10/2016   GLUCOSE 129 (H) 09/10/2016   CHOL 222 (H) 09/10/2016   TRIG 243.0 (H) 09/10/2016   HDL 47.00 09/10/2016   LDLDIRECT 139.0 09/10/2016   LDLCALC SEE COMMENT 11/26/2013   ALT 28 09/10/2016   AST 22 09/10/2016   NA 141 09/10/2016   K 4.0 09/10/2016   CL 99 09/10/2016   CREATININE 0.74 09/10/2016   BUN 11 09/10/2016   CO2 32 09/10/2016   TSH 3.30 09/10/2016   INR 1.1 11/27/2013   HGBA1C 7.3 (H) 09/10/2016   MICROALBUR <0.7 09/10/2016    Dg Chest 2 View  Result Date: 03/29/2016 CLINICAL DATA:  Dysnea EXAM: CHEST  2 VIEW COMPARISON:  01/02/2016, 10/07/2015 FINDINGS: The heart size and mediastinal contours stable in appearance. No  aortic aneurysm. Borderline cardiomegaly. Trace edema along the right major fissure. Subsegmental atelectasis at the left lung base. Tiny 2 mm nodular density overlying the right posterior fifth rib is new since prior exam and may reflect a pulmonary vessel on end or potentially tiny nodule. No overt pulmonary edema. Eventration of the right hemidiaphragm is stable. The visualized skeletal structures are unremarkable. IMPRESSION: No active cardiopulmonary disease. Platelike atelectasis at the left lung base. Tiny nodular density overlying the right upper lobe may reflect pulmonary vessel on end pulmonary nodule or summation shadow. It is unchanged in appearance when compared with recent comparison. Electronically Signed   By: Ashley Royalty M.D.   On: 03/29/2016 17:49    Assessment & Plan:   Ifrah was seen today for hyperlipidemia, hypothyroidism, diabetes and shoulder pain.  Diagnoses and all orders for this visit:  Essential hypertension, benign- her blood pressure is well-controlled, electrolytes and renal function are normal. -  Comprehensive metabolic panel; Future -     CBC with Differential/Platelet; Future -     Urinalysis, Routine w reflex microscopic; Future  Paroxysmal atrial fibrillation (HCC)- she is maintaining sinus rhythm, will continue anticoagulation with Eliquis  Acquired hypothyroidism- her TSH is on the normal range, will cont the current dose of T4 -     Thyroid Panel With TSH; Future  Type 2 diabetes mellitus with complication, without long-term current use of insulin ([place])- her blood sugar is adequately well controlled -     Comprehensive metabolic panel; Future -     Hemoglobin A1c; Future -     Microalbumin / creatinine urine ratio; Future  Class 2 severe obesity due to excess calories with serious comorbidity and body mass index (BMI) of 35.0 to 35.9 in adult [facility])- she is working on her lifestyle modifications to lose weight  Hypertriglyceridemia- mildly  elevated, does not require medical therapy at this time. -     Lipid panel; Future  Hyperlipidemia with target LDL less than 100- she has not achieved her LDL goal, she previously had side effects with rosuvastatin, I've asked her to try Pitavastatin -     Thyroid Panel With TSH; Future -     Lipid panel; Future -     Pitavastatin Calcium (LIVALO) 1 MG TABS; Take 1 tablet (1 mg total) by mouth daily.  Chronic right shoulder pain -     Ambulatory referral to Sports Medicine  GAD (generalized anxiety disorder) -     ALPRAZolam (XANAX) 0.25 MG tablet; take 1 tablet by mouth once daily if needed  Depression with anxiety -     ALPRAZolam (XANAX) 0.25 MG tablet; take 1 tablet by mouth once daily if needed  Insomnia due to anxiety and fear -     zolpidem (AMBIEN) 10 MG tablet; take 1 tablet by mouth at bedtime if needed   I have discontinued Ms. Vonada's nystatin-triamcinolone and nystatin ointment. I am also having her start on Pitavastatin Calcium. Additionally, I am having her maintain her glucose blood, amiodarone, albuterol, metoprolol succinate, cetirizine, apixaban, levothyroxine, PARoxetine, metFORMIN, hydrochlorothiazide, triamcinolone ointment, ALPRAZolam, and zolpidem. We will continue to administer cyanocobalamin.  Meds ordered this encounter  Medications  . ALPRAZolam (XANAX) 0.25 MG tablet    Sig: take 1 tablet by mouth once daily if needed    Dispense:  90 tablet    Refill:  2  . zolpidem (AMBIEN) 10 MG tablet    Sig: take 1 tablet by mouth at bedtime if needed    Dispense:  90 tablet    Refill:  1  . Pitavastatin Calcium (LIVALO) 1 MG TABS    Sig: Take 1 tablet (1 mg total) by mouth daily.    Dispense:  90 tablet    Refill:  1     Follow-up: Return in about 4 months (around 01/11/2017).  [name], MD

## 2016-09-10 NOTE — Patient Instructions (Signed)

## 2016-09-11 ENCOUNTER — Encounter: Payer: Self-pay | Admitting: Internal Medicine

## 2016-09-11 ENCOUNTER — Other Ambulatory Visit: Payer: Self-pay | Admitting: Internal Medicine

## 2016-09-11 LAB — THYROID PANEL WITH TSH
FREE THYROXINE INDEX: 4.1 — AB (ref 1.4–3.8)
T3 UPTAKE: 27 % (ref 22–35)
T4, Total: 15.1 ug/dL — ABNORMAL HIGH (ref 4.5–12.0)
TSH: 3.3 m[IU]/L

## 2016-09-11 MED ORDER — PITAVASTATIN CALCIUM 1 MG PO TABS: 1.0000 | ORAL_TABLET | Freq: Every day | ORAL | 1 refills | Status: DC

## 2016-09-17 ENCOUNTER — Other Ambulatory Visit (INDEPENDENT_AMBULATORY_CARE_PROVIDER_SITE_OTHER): Payer: PPO

## 2016-09-17 DIAGNOSIS — R300 Dysuria: Secondary | ICD-10-CM | POA: Diagnosis not present

## 2016-09-17 DIAGNOSIS — R319 Hematuria, unspecified: Secondary | ICD-10-CM

## 2016-09-17 LAB — POCT URINALYSIS DIPSTICK
BILIRUBIN UA: NEGATIVE
GLUCOSE UA: NEGATIVE
KETONES UA: NEGATIVE
Nitrite, UA: NEGATIVE
SPEC GRAV UA: 1.015 (ref 1.010–1.025)
Urobilinogen, UA: 0.2 E.U./dL
pH, UA: 6 (ref 5.0–8.0)

## 2016-09-17 MED ORDER — PHENAZOPYRIDINE HCL 100 MG PO TABS: 100.0000 mg | ORAL_TABLET | Freq: Three times a day (TID) | ORAL | 0 refills | Status: DC | PRN

## 2016-09-17 MED ORDER — NITROFURANTOIN MONOHYD MACRO 100 MG PO CAPS: 100.0000 mg | ORAL_CAPSULE | Freq: Two times a day (BID) | ORAL | 0 refills | Status: DC

## 2016-09-17 NOTE — Addendum Note (Signed)
Addended by: Elouise Munroe on: 09/17/2016 11:05 AM   Modules accepted: Orders

## 2016-09-19 LAB — URINE CULTURE

## 2016-09-24 ENCOUNTER — Telehealth: Payer: Self-pay | Admitting: Obstetrics and Gynecology

## 2016-09-24 NOTE — Telephone Encounter (Signed)
Patient Kirsten [name] wanting to speak with Joyice Faster in regards to /her Urine Culture results, the patient did not disclose any other information. Please advise.

## 2016-09-25 NOTE — Telephone Encounter (Signed)
Pt aware culture showed 2 organisms. ATB complete. Pt still having mild sx. Pt will stop by for repeat culture.

## 2016-09-27 ENCOUNTER — Other Ambulatory Visit: Payer: Self-pay

## 2016-09-27 ENCOUNTER — Other Ambulatory Visit: Payer: PPO

## 2016-09-27 DIAGNOSIS — R300 Dysuria: Secondary | ICD-10-CM

## 2016-09-27 NOTE — Addendum Note (Signed)
Addended by: Elouise Munroe on: 09/27/2016 08:18 AM   Modules accepted: Orders

## 2016-09-28 ENCOUNTER — Telehealth: Payer: Self-pay | Admitting: Obstetrics and Gynecology

## 2016-09-28 NOTE — Telephone Encounter (Signed)
Patient lvm stating that she would like to spaek with Joyice Faster, The patient is still having UTI symptoms, And would like to talk with someone to discuss what can be done to stop it. The -patient did not disclose any other information. Please advise.

## 2016-09-29 LAB — URINE CULTURE

## 2016-10-01 NOTE — Telephone Encounter (Signed)
Pt aware urine culture neg. Pt needs an appt. Pt will check her calendar and contact office for appt.

## 2016-10-10 DIAGNOSIS — R079 Chest pain, unspecified: Secondary | ICD-10-CM | POA: Diagnosis not present

## 2016-10-12 ENCOUNTER — Ambulatory Visit
Admission: RE | Admit: 2016-10-12 | Discharge: 2016-10-12 | Disposition: A | Discharge status: Home / Self Care | Payer: PPO | Source: Ambulatory Visit | Attending: Obstetrics and Gynecology | Admitting: Obstetrics and Gynecology

## 2016-10-12 DIAGNOSIS — I2510 Atherosclerotic heart disease of native coronary artery without angina pectoris: Secondary | ICD-10-CM | POA: Diagnosis not present

## 2016-10-12 DIAGNOSIS — I4891 Unspecified atrial fibrillation: Secondary | ICD-10-CM | POA: Diagnosis not present

## 2016-10-12 DIAGNOSIS — I10 Essential (primary) hypertension: Secondary | ICD-10-CM | POA: Diagnosis not present

## 2016-10-12 DIAGNOSIS — Z1231 Encounter for screening mammogram for malignant neoplasm of breast: Secondary | ICD-10-CM | POA: Diagnosis not present

## 2016-10-12 DIAGNOSIS — Z1239 Encounter for other screening for malignant neoplasm of breast: Secondary | ICD-10-CM

## 2016-10-12 DIAGNOSIS — R0602 Shortness of breath: Secondary | ICD-10-CM | POA: Diagnosis not present

## 2016-10-12 LAB — HM MAMMOGRAPHY

## 2016-10-13 ENCOUNTER — Other Ambulatory Visit: Payer: Self-pay | Admitting: Internal Medicine

## 2016-10-13 DIAGNOSIS — E039 Hypothyroidism, unspecified: Secondary | ICD-10-CM

## 2016-10-19 DIAGNOSIS — I2510 Atherosclerotic heart disease of native coronary artery without angina pectoris: Secondary | ICD-10-CM | POA: Diagnosis not present

## 2016-10-24 DIAGNOSIS — I340 Nonrheumatic mitral (valve) insufficiency: Secondary | ICD-10-CM | POA: Diagnosis not present

## 2016-10-24 DIAGNOSIS — R002 Palpitations: Secondary | ICD-10-CM | POA: Diagnosis not present

## 2016-10-24 DIAGNOSIS — I4891 Unspecified atrial fibrillation: Secondary | ICD-10-CM | POA: Diagnosis not present

## 2016-11-01 ENCOUNTER — Ambulatory Visit: Payer: PPO | Admitting: Adult Health

## 2016-11-05 ENCOUNTER — Ambulatory Visit: Payer: PPO | Admitting: Adult Health

## 2016-11-13 ENCOUNTER — Ambulatory Visit: Payer: PPO | Admitting: Family Medicine

## 2016-11-19 DIAGNOSIS — D485 Neoplasm of uncertain behavior of skin: Secondary | ICD-10-CM | POA: Diagnosis not present

## 2016-11-19 DIAGNOSIS — L570 Actinic keratosis: Secondary | ICD-10-CM | POA: Diagnosis not present

## 2016-11-19 DIAGNOSIS — D229 Melanocytic nevi, unspecified: Secondary | ICD-10-CM | POA: Diagnosis not present

## 2016-11-19 DIAGNOSIS — L814 Other melanin hyperpigmentation: Secondary | ICD-10-CM | POA: Diagnosis not present

## 2016-11-19 DIAGNOSIS — L821 Other seborrheic keratosis: Secondary | ICD-10-CM | POA: Diagnosis not present

## 2016-11-19 DIAGNOSIS — L739 Follicular disorder, unspecified: Secondary | ICD-10-CM | POA: Diagnosis not present

## 2016-11-19 DIAGNOSIS — Z85828 Personal history of other malignant neoplasm of skin: Secondary | ICD-10-CM | POA: Diagnosis not present

## 2016-11-19 DIAGNOSIS — Z1283 Encounter for screening for malignant neoplasm of skin: Secondary | ICD-10-CM | POA: Diagnosis not present

## 2016-11-19 DIAGNOSIS — L820 Inflamed seborrheic keratosis: Secondary | ICD-10-CM | POA: Diagnosis not present

## 2016-11-19 DIAGNOSIS — D1800 Hemangioma unspecified site: Secondary | ICD-10-CM | POA: Diagnosis not present

## 2016-11-19 DIAGNOSIS — B360 Pityriasis versicolor: Secondary | ICD-10-CM | POA: Diagnosis not present

## 2016-11-19 DIAGNOSIS — L578 Other skin changes due to chronic exposure to nonionizing radiation: Secondary | ICD-10-CM | POA: Diagnosis not present

## 2016-11-20 DIAGNOSIS — M9903 Segmental and somatic dysfunction of lumbar region: Secondary | ICD-10-CM | POA: Diagnosis not present

## 2016-11-20 DIAGNOSIS — M5010 Cervical disc disorder with radiculopathy, unspecified cervical region: Secondary | ICD-10-CM | POA: Diagnosis not present

## 2016-11-20 DIAGNOSIS — R51 Headache: Secondary | ICD-10-CM | POA: Diagnosis not present

## 2016-11-20 DIAGNOSIS — M9901 Segmental and somatic dysfunction of cervical region: Secondary | ICD-10-CM | POA: Diagnosis not present

## 2016-11-23 ENCOUNTER — Ambulatory Visit (INDEPENDENT_AMBULATORY_CARE_PROVIDER_SITE_OTHER): Payer: PPO | Admitting: Family Medicine

## 2016-11-23 ENCOUNTER — Encounter: Payer: Self-pay | Admitting: Family Medicine

## 2016-11-23 ENCOUNTER — Ambulatory Visit: Payer: Self-pay

## 2016-11-23 VITALS — BP 138/82 | HR 71 | Ht 67.0 in | Wt 220.0 lb

## 2016-11-23 DIAGNOSIS — Z23 Encounter for immunization: Secondary | ICD-10-CM | POA: Diagnosis not present

## 2016-11-23 DIAGNOSIS — M25511 Pain in right shoulder: Secondary | ICD-10-CM

## 2016-11-23 DIAGNOSIS — M7140 Calcium deposit in bursa, unspecified site: Secondary | ICD-10-CM

## 2016-11-23 DIAGNOSIS — E538 Deficiency of other specified B group vitamins: Secondary | ICD-10-CM | POA: Diagnosis not present

## 2016-11-23 MED ORDER — VITAMIN D (ERGOCALCIFEROL) 1.25 MG (50000 UNIT) PO CAPS: 50000.0000 [IU] | ORAL_CAPSULE | ORAL | 0 refills | Status: DC

## 2016-11-23 MED ORDER — CYANOCOBALAMIN 1000 MCG/ML IJ SOLN
1000.0000 ug | Freq: Once | INTRAMUSCULAR | Status: AC
  Administered 2016-11-23: 1000 ug via INTRAMUSCULAR

## 2016-11-23 NOTE — Assessment & Plan Note (Signed)
Calcific bursitis of the right shoulder. Discussed with patient about icing regimen, home exercise, which activities to do a which was to avoid. Patient responded well to the injection. Patient declined formal physical therapy at this point. Patient follow-up with me again in 4 weeks

## 2016-11-23 NOTE — Progress Notes (Signed)
[name] [address] [place], [place] [address] Phone: [phone] Subjective:    I'm seeing this patient by the request  of:  Kirsten Lima, MD   CC: Right shoulder pain  ASN:[id number]  Kirsten [name] is a 61 y.o. female coming in with complaint of right shoulder pain that has been going on for approximately 2 months. She does have pain that radiates down the arm but not past the elbow. She has a hard time with overhead motions. She said that she feels like her shoulder gets stuck. She does have a history of humeral fracture as a child. She has tried icing but that did not help.   Onset-  2 months Location- right shoulder Duration- constant Character- achy Aggravating factors- overhead motions Reliving factors- keeping her arm below shoulder height Therapies tried- ice Severity-8 out of 10     Past Medical History:  Diagnosis Date  . A-fib ([place])   . Anxiety disorder   . Asthma   . Gastroschisis    umphalocele, rotated organs abd repair until age 44  . GERD (gastroesophageal reflux disease)   . HTN (hypertension)   . Hyperlipidemia   . IBS (irritable bowel syndrome)   . Morbid obesity ([place])    Target wt - 185  for BMI < 30  . Obesity   . Pulmonary fibrosis ([place])   . SBO (small bowel obstruction) ([place])    Resolved with NG/Bowel rest around 2009  . Sleep apnea   . Type II or unspecified type diabetes mellitus without mention of complication, not stated as uncontrolled    Past Surgical History:  Procedure Laterality Date  . BREAST EXCISIONAL BIOPSY Left 03/19/2012   neg  . CESAREAN SECTION    . CHOLECYSTECTOMY  1992  . Newborn Surgery - GI - ORGANS OUTSIDE ABDOMEN    . Small Bowel Repair    . TUBAL LIGATION  1988   Social History   Social History  . Marital status: Married    Spouse name: N/A  . Number of children: N/A  . Years of education: N/A   Occupational History  . Retired     Research scientist (physical sciences) at [employer] Topics  . Smoking status: Never Smoker  . Smokeless tobacco: Never Used     Comment: "tried as a teen"  . Alcohol use 1.2 oz/week    2 Glasses of wine per week     Comment: rare  . Drug use: No  . Sexual activity: Yes    Birth control/ protection: Surgical   Other Topics Concern  . Not on file   Social History Narrative   Regular Exercise -  YES   Daily Caffeine Use:  2   Active and independent at baseline         Allergies  Allergen Reactions  . Factive [Gemifloxacin Mesylate] Rash  . Crestor [Rosuvastatin]     GI upset  . Sulfonamide Derivatives     REACTION: rash  . Gemifloxacin Rash  . Penicillins Hives and Rash    Has patient had a PCN reaction causing immediate rash, facial/tongue/throat swelling, SOB or lightheadedness with hypotension: No Has patient had a PCN reaction causing severe rash involving mucus membranes or skin necrosis: No Has patient had a PCN reaction that required hospitalization No Has patient had a PCN reaction occurring within the last 10 years: No If all of the above answers are "NO", then  may proceed with Cephalosporin use.    Family History  Problem Relation Age of Onset  . Lymphoma Mother   . Diabetes type II Father   . Diabetes type I Son   . Diabetes Maternal Grandmother   . Goiter Neg Hx   . Colon cancer Neg Hx   . Breast cancer Neg Hx      Past medical history, social, surgical and family history all reviewed in electronic medical record.  No pertanent information unless stated regarding to the chief complaint.   Review of Systems:Review of systems updated and as accurate as of 11/23/16  No headache, visual changes, nausea, vomiting, diarrhea, constipation, dizziness, abdominal pain, skin rash, fevers, chills, night sweats, weight loss, swollen lymph nodes, body aches, joint swelling, muscle aches, chest pain, shortness of breath, mood changes.   Objective  There were no vitals taken for this  visit. Systems examined below as of 11/23/16   General: No apparent distress alert and oriented x3 mood and affect normal, dressed appropriately.  HEENT: Pupils equal, extraocular movements intact  Respiratory: Patient's speak in full sentences and does not appear short of breath  Cardiovascular: No lower extremity edema, non tender, no erythema  Skin: Warm dry intact with no signs of infection or rash on extremities or on axial skeleton.  Abdomen: Soft nontender  Neuro: Cranial nerves II through XII are intact, neurovascularly intact in all extremities with 2+ DTRs and 2+ pulses.  Lymph: No lymphadenopathy of posterior or anterior cervical chain or axillae bilaterally.  Gait normal with good balance and coordination.  MSK:  Non tender with full range of motion and good stability and symmetric strength and tone of  elbows, wrist, hip, knee and ankles bilaterally.  Shoulder: Right Inspection reveals no abnormalities, atrophy or asymmetry. Palpation is normal with no tenderness over AC joint or bicipital groove. ROM is full in all planes passively. Rotator cuff strength normal throughout. signs of impingement with positive Neer and Hawkin's tests, but negative empty can sign. Speeds and Yergason's tests normal. No labral pathology noted with negative Obrien's, negative clunk and good stability. Normal scapular function observed. No painful arc and no drop arm sign. No apprehension sign  MSK US performed of: Right This study was ordered, performed, and interpreted by Charlann Boxer D.O.  Shoulder:   Supraspinatus:  Appears normal on long and transverse views, Bursal bulge seen with shoulder abduction on impingement view. Ossific changes noted Infraspinatus:  Appears normal on long and transverse views. Significant increase in Doppler flow Subscapularis:  Appears normal on long and transverse views. Positive bursa calcific changes Teres Minor:  Appears normal on long and transverse views. AC  joint:  Capsule undistended, no geyser sign. Glenohumeral Joint:  Appears normal without effusion. Glenoid Labrum:  Intact without visualized tears. Biceps Tendon:  Appears normal on long and transverse views, no fraying of tendon, tendon located in intertubercular groove, no subluxation with shoulder internal or external rotation.  Impression: Calcific Subacromial bursitis  Procedure: Real-time Ultrasound Guided Injection of right glenohumeral joint Device: GE Logiq E  Ultrasound guided injection is preferred based studies that show increased duration, increased effect, greater accuracy, decreased procedural pain, increased response rate with ultrasound guided versus blind injection.  Verbal informed consent obtained.  Time-out conducted.  Noted no overlying erythema, induration, or other signs of local infection.  Skin prepped in a sterile fashion.  Local anesthesia: Topical Ethyl chloride.  With sterile technique and under real time ultrasound guidance:  Joint visualized.  23g  1  inch needle inserted posterior approach. Pictures taken for needle placement. Patient did have injection of 2 cc of 1% lidocaine, 2 cc of 0.5% Marcaine, and 1.0 cc of Kenalog 40 mg/dL. Completed without difficulty  Pain immediately resolved suggesting accurate placement of the medication.  Advised to call if fevers/chills, erythema, induration, drainage, or persistent bleeding.  Images permanently stored and available for review in the ultrasound unit.  Impression: Technically successful ultrasound guided injection.    [id number]; 15 additional minutes spent for Therapeutic exercises as stated in above notes.  This included exercises focusing on stretching, strengthening, with significant focus on eccentric aspects.   Long term goals include an improvement in range of motion, strength, endurance as well as avoiding reinjury. Patient's frequency would include in 1-2 times a day, 3-5 times a week for a duration of 6-12  weeks.  Shoulder Exercises that included:  Basic scapular stabilization to include adduction and depression of scapula Scaption, focusing on proper movement and good control Internal and External rotation utilizing a theraband, with elbow tucked at side entire time Rows with theraband given   Proper technique shown and discussed handout in great detail with ATC.  All questions were discussed and answered.   Impression and Recommendations:     This case required medical decision making of moderate complexity.      Note: This dictation was prepared with Dragon dictation along with smaller phrase technology. Any transcriptional errors that result from this process are unintentional.

## 2016-11-23 NOTE — Patient Instructions (Signed)
Good to see you.  Ice 20 minutes 2 times daily. Usually after activity and before bed. Exercises 3 times a week.  pennsaid pinkie amount topically 2 times daily as needed.   Keep hands within peripheral vision  Once weekly vitamin D for next 12 weeks See em again in 4 weeks.

## 2016-12-12 ENCOUNTER — Telehealth: Payer: Self-pay | Admitting: Pulmonary Disease

## 2016-12-12 NOTE — Telephone Encounter (Signed)
lmtcb

## 2016-12-13 NOTE — Telephone Encounter (Signed)
Waiting for a call back of which company her insurance will cover.  Pt wanted to find this out and call us back.

## 2016-12-17 NOTE — Telephone Encounter (Signed)
lmomtcb x2 for pt 

## 2016-12-17 NOTE — Telephone Encounter (Signed)
Spoke with pt, who states she has not done any research at this time. Pt states she has an upcoming apt on 12/20/16, and she will let us know what DME company at her apt. Will close encounter, as nothing further is needed.

## 2016-12-17 NOTE — Telephone Encounter (Signed)
Returning call-tr

## 2016-12-20 ENCOUNTER — Ambulatory Visit: Payer: PPO | Admitting: Adult Health

## 2016-12-20 ENCOUNTER — Encounter: Payer: Self-pay | Admitting: Adult Health

## 2016-12-20 DIAGNOSIS — G4733 Obstructive sleep apnea (adult) (pediatric): Secondary | ICD-10-CM

## 2016-12-20 DIAGNOSIS — J4530 Mild persistent asthma, uncomplicated: Secondary | ICD-10-CM

## 2016-12-20 NOTE — Progress Notes (Signed)
@Patient  ID: Kirsten [name], female    DOB: 1955/07/04, 61 y.o.   MRN: [id number]  Chief Complaint  Patient presents with  . Follow-up    OSA     Referring provider: [name], MD  HPI:  61 yo female followed for OSA and Asthma   TEST  PSG 2010- AHI of 80 events per hour. Maintained onauto CPAP   12/20/2016 Follow up ; OSA  Patient presents for a one year follow-up.  Patient says she is doing very well on her CPAP.  She feels rested with no significant daytime sleepiness.  She wears her CPAP every single night.  Download shows excellent compliance with average usage at 8.5 hours.  AHI 1.2.  Minimal leaks.  Patient is on CPAP 8 cm H2O. Patient does have some mild sinus congestion and drainage intermittently.  She says her asthma has been doing well.  Without any flare.  She does not use her albuterol inhaler.  She did have an asthma flare in February with a hospitalization.  Patient says this was after the flu..  Allergies  Allergen Reactions  . Factive [Gemifloxacin Mesylate] Rash  . Crestor [Rosuvastatin]     GI upset  . Sulfonamide Derivatives     REACTION: rash  . Gemifloxacin Rash  . Penicillins Hives and Rash    Has patient had a PCN reaction causing immediate rash, facial/tongue/throat swelling, SOB or lightheadedness with hypotension: No Has patient had a PCN reaction causing severe rash involving mucus membranes or skin necrosis: No Has patient had a PCN reaction that required hospitalization No Has patient had a PCN reaction occurring within the last 10 years: No If all of the above answers are "NO", then may proceed with Cephalosporin use.     Immunization History  Administered Date(s) Administered  . Influenza Split 11/29/2010, 11/23/2011  . Influenza Whole 11/12/2009  . Influenza,inj,Quad PF,6+ Mos 10/22/2012, 12/31/2013, 11/02/2014, 11/28/2015, 11/23/2016  . Pneumococcal Polysaccharide-23 11/23/2011  . Tdap 08/02/2010    Past Medical  History:  Diagnosis Date  . A-fib ([place])   . Anxiety disorder   . Asthma   . 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 GERD (gastroesophageal reflux disease)   . HTN (hypertension)   . Hyperlipidemia   . IBS (irritable bowel syndrome)   . Morbid obesity ([place])    Target wt - 185  for BMI < 30  . Obesity   . Pulmonary fibrosis ([place])   . SBO (small bowel obstruction) ([place])    Resolved with NG/Bowel rest around 2009  . Sleep apnea   . Type II or unspecified type diabetes mellitus without mention of complication, not stated as uncontrolled     Tobacco History: Social History   Tobacco Use  Smoking Status Never Smoker  Smokeless Tobacco Never Used  Tobacco Comment   "tried as a teen"   Counseling given: Not Answered Comment: "tried as a teen"   Outpatient Encounter Medications as of 12/20/2016  Medication Sig  . albuterol (PROVENTIL HFA;VENTOLIN HFA) 108 (90 Base) MCG/ACT inhaler Inhale 2 puffs into the lungs every 6 (six) hours as needed for wheezing or shortness of breath.  . ALPRAZolam (XANAX) 0.25 MG tablet take 1 tablet by mouth once daily if needed  . amiodarone (PACERONE) 200 MG tablet Take 200 mg by mouth daily.  Marland Kitchen apixaban (ELIQUIS) 5 MG TABS tablet Take 1 tablet (5 mg total) by mouth 2 (two) times daily.  Marland Kitchen  cetirizine (ZYRTEC) 10 MG tablet Take 1 tablet (10 mg total) by mouth daily.  Marland Kitchen glucose blood (ONE TOUCH ULTRA TEST) test strip Use as instructed  . hydrochlorothiazide (HYDRODIURIL) 25 MG tablet take 1 tablet by mouth once daily  . metFORMIN (GLUCOPHAGE) 1000 MG tablet take 1 tablet by mouth twice a day with A MEAL  . metoprolol succinate (TOPROL-XL) 50 MG 24 hr tablet Take 50 mg by mouth daily.   . nitrofurantoin, macrocrystal-monohydrate, (MACROBID) 100 MG capsule Take 1 capsule (100 mg total) by mouth 2 (two) times daily.  Marland Kitchen PARoxetine (PAXIL-CR) 25 MG 24 hr tablet Take 1 tablet (25 mg total) by mouth daily.  . phenazopyridine  (PYRIDIUM) 100 MG tablet Take 1 tablet (100 mg total) by mouth 3 (three) times daily as needed for pain.  . Pitavastatin Calcium (LIVALO) 1 MG TABS Take 1 tablet (1 mg total) by mouth daily.  Marland Kitchen SYNTHROID 88 MCG tablet take 1 tablet by mouth once daily BEFORE BREAKFAST  . triamcinolone ointment (KENALOG) 0.1 % Apply 1 application topically 2 (two) times daily.  . Vitamin D, Ergocalciferol, (DRISDOL) 50000 units CAPS capsule Take 1 capsule (50,000 Units total) by mouth every 7 (seven) days.  Marland Kitchen zolpidem (AMBIEN) 10 MG tablet take 1 tablet by mouth at bedtime if needed   Facility-Administered Encounter Medications as of 12/20/2016  Medication  . cyanocobalamin ((VITAMIN B-12)) injection 1,000 mcg     Review of Systems  Constitutional:   No  weight loss, night sweats,  Fevers, chills, fatigue, or  lassitude.  HEENT:   No headaches,  Difficulty swallowing,  Tooth/dental problems, or  Sore throat,                No sneezing, itching, ear ache,  +nasal congestion, post nasal drip,   CV:  No chest pain,  Orthopnea, PND, swelling in lower extremities, anasarca, dizziness, palpitations, syncope.   GI  No heartburn, indigestion, abdominal pain, nausea, vomiting, diarrhea, change in bowel habits, loss of appetite, bloody stools.   Resp: No shortness of breath with exertion or at rest.  No excess mucus, no productive cough,  No non-productive cough,  No coughing up of blood.  No change in color of mucus.  No wheezing.  No chest wall deformity  Skin: no rash or lesions.  GU: no dysuria, change in color of urine, no urgency or frequency.  No flank pain, no hematuria   MS:  No joint pain or swelling.  No decreased range of motion.  No back pain.    Physical Exam  BP 112/64 (BP Location: Left Arm, Cuff Size: Normal)   Pulse 73   Ht 5' 7"  (1.702 m)   Wt 218 lb (98.9 kg)   SpO2 95%   BMI 34.14 kg/m   GEN: A/Ox3; pleasant , NAD, well nourished    HEENT:  [place]/AT,  EACs-clear, TMs-wnl,  NOSE-clear, THROAT-clear, no lesions, no postnasal drip or exudate noted.   NECK:  Supple w/ fair ROM; no JVD; normal carotid impulses w/o bruits; no thyromegaly or nodules palpated; no lymphadenopathy.    RESP  Clear  P & A; w/o, wheezes/ rales/ or rhonchi. no accessory muscle use, no dullness to percussion  CARD:  RRR, no m/r/g, no peripheral edema, pulses intact, no cyanosis or clubbing.  GI:   Soft & nt; nml bowel sounds; no organomegaly or masses detected.   Musco: Warm bil, no deformities or joint swelling noted.   Neuro: alert, no focal deficits noted.  Skin: Warm, no lesions or rashes    Lab Results:  BMET   Imaging:    Assessment & Plan:   No problem-specific Assessment & Plan notes found for this encounter.     Rexene Edison, NP 12/20/2016

## 2016-12-20 NOTE — Assessment & Plan Note (Signed)
Well-controlled.  With excellent compliance and control.   Plan  Patient Instructions  Continue on CPAP At bedtime   Wear each night , goal for at least 6hr  Work on weight loss.  Do not drive if sleepy  Saline nasal rinses and gel As needed   Follow up Dr. Elsworth Soho  In 1 year and As needed

## 2016-12-20 NOTE — Assessment & Plan Note (Signed)
Stable w/out flare

## 2016-12-20 NOTE — Patient Instructions (Addendum)
Continue on CPAP At bedtime   Wear each night , goal for at least 6hr  Work on weight loss.  Do not drive if sleepy  Saline nasal rinses and gel As needed   Follow up Dr. Elsworth Soho  In 1 year and As needed

## 2016-12-20 NOTE — Progress Notes (Signed)
@Patient  ID: Kirsten [name], female    DOB: 1955-04-20, 61 y.o.   MRN: [id number]  No chief complaint on file.   Referring provider: [name], MD  HPI:   Allergies  Allergen Reactions  . Factive [Gemifloxacin Mesylate] Rash  . Crestor [Rosuvastatin]     GI upset  . Sulfonamide Derivatives     REACTION: rash  . Gemifloxacin Rash  . Penicillins Hives and Rash    Has patient had a PCN reaction causing immediate rash, facial/tongue/throat swelling, SOB or lightheadedness with hypotension: No Has patient had a PCN reaction causing severe rash involving mucus membranes or skin necrosis: No Has patient had a PCN reaction that required hospitalization No Has patient had a PCN reaction occurring within the last 10 years: No If all of the above answers are "NO", then may proceed with Cephalosporin use.     Immunization History  Administered Date(s) Administered  . Influenza Split 11/29/2010, 11/23/2011  . Influenza Whole 11/12/2009  . Influenza,inj,Quad PF,6+ Mos 10/22/2012, 12/31/2013, 11/02/2014, 11/28/2015, 11/23/2016  . Pneumococcal Polysaccharide-23 11/23/2011  . Tdap 08/02/2010    Past Medical History:  Diagnosis Date  . A-fib ([place])   . Anxiety disorder   . Asthma   . Gastroschisis    umphalocele, rotated organs abd repair until age 34  . GERD (gastroesophageal reflux disease)   . HTN (hypertension)   . Hyperlipidemia   . IBS (irritable bowel syndrome)   . Morbid obesity ([place])    Target wt - 185  for BMI < 30  . Obesity   . Pulmonary fibrosis ([place])   . SBO (small bowel obstruction) ([place])    Resolved with NG/Bowel rest around 2009  . Sleep apnea   . Type II or unspecified type diabetes mellitus without mention of complication, not stated as uncontrolled     Tobacco History: Social History   Tobacco Use  Smoking Status Never Smoker  Smokeless Tobacco Never Used  Tobacco Comment   "tried as a teen"   Counseling given: Not Answered Comment:  "tried as a teen"   Outpatient Encounter Medications as of 12/20/2016  Medication Sig  . albuterol (PROVENTIL HFA;VENTOLIN HFA) 108 (90 Base) MCG/ACT inhaler Inhale 2 puffs into the lungs every 6 (six) hours as needed for wheezing or shortness of breath.  . ALPRAZolam (XANAX) 0.25 MG tablet take 1 tablet by mouth once daily if needed  . amiodarone (PACERONE) 200 MG tablet Take 200 mg by mouth daily.  Marland Kitchen apixaban (ELIQUIS) 5 MG TABS tablet Take 1 tablet (5 mg total) by mouth 2 (two) times daily.  . cetirizine (ZYRTEC) 10 MG tablet Take 1 tablet (10 mg total) by mouth daily.  Marland Kitchen glucose blood (ONE TOUCH ULTRA TEST) test strip Use as instructed  . hydrochlorothiazide (HYDRODIURIL) 25 MG tablet take 1 tablet by mouth once daily  . metFORMIN (GLUCOPHAGE) 1000 MG tablet take 1 tablet by mouth twice a day with A MEAL  . metoprolol succinate (TOPROL-XL) 50 MG 24 hr tablet Take 50 mg by mouth daily.   . nitrofurantoin, macrocrystal-monohydrate, (MACROBID) 100 MG capsule Take 1 capsule (100 mg total) by mouth 2 (two) times daily.  Marland Kitchen PARoxetine (PAXIL-CR) 25 MG 24 hr tablet Take 1 tablet (25 mg total) by mouth daily.  . phenazopyridine (PYRIDIUM) 100 MG tablet Take 1 tablet (100 mg total) by mouth 3 (three) times daily as needed for pain.  . Pitavastatin Calcium (LIVALO) 1 MG TABS Take 1 tablet (1 mg total) by  mouth daily.  Marland Kitchen SYNTHROID 88 MCG tablet take 1 tablet by mouth once daily BEFORE BREAKFAST  . triamcinolone ointment (KENALOG) 0.1 % Apply 1 application topically 2 (two) times daily.  . Vitamin D, Ergocalciferol, (DRISDOL) 50000 units CAPS capsule Take 1 capsule (50,000 Units total) by mouth every 7 (seven) days.  Marland Kitchen zolpidem (AMBIEN) 10 MG tablet take 1 tablet by mouth at bedtime if needed   Facility-Administered Encounter Medications as of 12/20/2016  Medication  . cyanocobalamin ((VITAMIN B-12)) injection 1,000 mcg     Review of Systems  Constitutional:   No  weight loss, night sweats,   Fevers, chills, fatigue, or  lassitude.  HEENT:   No headaches,  Difficulty swallowing,  Tooth/dental problems, or  Sore throat,                No sneezing, itching, ear ache, nasal congestion, post nasal drip,   CV:  No chest pain,  Orthopnea, PND, swelling in lower extremities, anasarca, dizziness, palpitations, syncope.   GI  No heartburn, indigestion, abdominal pain, nausea, vomiting, diarrhea, change in bowel habits, loss of appetite, bloody stools.   Resp: No shortness of breath with exertion or at rest.  No excess mucus, no productive cough,  No non-productive cough,  No coughing up of blood.  No change in color of mucus.  No wheezing.  No chest wall deformity  Skin: no rash or lesions.  GU: no dysuria, change in color of urine, no urgency or frequency.  No flank pain, no hematuria   MS:  No joint pain or swelling.  No decreased range of motion.  No back pain.    Physical Exam  BP 112/64 (BP Location: Left Arm, Cuff Size: Normal)   Pulse 73   Ht 5' 7"  (1.702 m)   Wt 218 lb (98.9 kg)   SpO2 95%   BMI 34.14 kg/m   GEN: A/Ox3; pleasant , NAD, well nourished    HEENT:  [place]/AT,  EACs-clear, TMs-wnl, NOSE-clear, THROAT-clear, no lesions, no postnasal drip or exudate noted.   NECK:  Supple w/ fair ROM; no JVD; normal carotid impulses w/o bruits; no thyromegaly or nodules palpated; no lymphadenopathy.    RESP  Clear  P & A; w/o, wheezes/ rales/ or rhonchi. no accessory muscle use, no dullness to percussion  CARD:  RRR, no m/r/g, no peripheral edema, pulses intact, no cyanosis or clubbing.  GI:   Soft & nt; nml bowel sounds; no organomegaly or masses detected.   Musco: Warm bil, no deformities or joint swelling noted.   Neuro: alert, no focal deficits noted.    Skin: Warm, no lesions or rashes    Lab Results:  CBC    Component Value Date/Time   WBC 9.0 09/10/2016 1147   RBC 4.97 09/10/2016 1147   HGB 14.8 09/10/2016 1147   HGB 13.7 10/21/2015 1433   HCT 45.0  09/10/2016 1147   HCT 40.7 10/21/2015 1433   PLT 300.0 09/10/2016 1147   PLT 335 10/21/2015 1433   MCV 90.6 09/10/2016 1147   MCV 91 10/21/2015 1433   MCV 88 11/27/2013 0426   MCH 30.9 04/03/2016 0530   MCHC 32.9 09/10/2016 1147   RDW 13.9 09/10/2016 1147   RDW 14.5 10/21/2015 1433   RDW 13.7 11/27/2013 0426   LYMPHSABS 3.1 09/10/2016 1147   LYMPHSABS 3.5 (H) 10/21/2015 1433   LYMPHSABS 3.7 (H) 11/27/2013 0426   MONOABS 0.6 09/10/2016 1147   MONOABS 0.6 11/27/2013 0426   EOSABS 0.2  09/10/2016 1147   EOSABS 0.2 10/21/2015 1433   EOSABS 0.2 11/27/2013 0426   BASOSABS 0.1 09/10/2016 1147   BASOSABS 0.0 10/21/2015 1433   BASOSABS 0.1 11/27/2013 0426    BMET    Component Value Date/Time   NA 141 09/10/2016 1147   NA 142 11/27/2013 0426   K 4.0 09/10/2016 1147   K 4.5 11/27/2013 1338   CL 99 09/10/2016 1147   CL 107 11/27/2013 0426   CO2 32 09/10/2016 1147   CO2 28 11/27/2013 0426   GLUCOSE 129 (H) 09/10/2016 1147   GLUCOSE 144 (H) 11/27/2013 0426   BUN 11 09/10/2016 1147   BUN 7 11/27/2013 0426   CREATININE 0.74 09/10/2016 1147   CREATININE 0.60 11/27/2013 0426   CALCIUM 10.3 09/10/2016 1147   CALCIUM 8.2 (L) 11/27/2013 0426   GFRNONAA >60 04/03/2016 0530   GFRNONAA >60 11/27/2013 0426   GFRNONAA >60 07/07/2013 1126   GFRAA >60 04/03/2016 0530   GFRAA >60 11/27/2013 0426   GFRAA >60 07/07/2013 1126    BNP    Component Value Date/Time   BNP 33 11/26/2013 1215    ProBNP    Component Value Date/Time   PROBNP 9.0 07/05/2010 1215    Imaging: [place] Limited Joint Space Structures Up Right  Result Date: 11/27/2016 [facility] US performed of: Right This study was ordered, performed, and interpreted by Charlann Boxer D.O.  Shoulder:  Supraspinatus:  Appears normal on long and transverse views, Bursal bulge seen with shoulder abduction on impingement view. Ossific changes noted Infraspinatus:  Appears normal on long and transverse views. Significant increase in Doppler flow  Subscapularis:  Appears normal on long and transverse views. Positive bursa calcific changes Teres Minor:  Appears normal on long and transverse views. AC joint:  Capsule undistended, no geyser sign. Glenohumeral Joint:  Appears normal without effusion. Glenoid Labrum:  Intact without visualized tears. Biceps Tendon:  Appears normal on long and transverse views, no fraying of tendon, tendon located in intertubercular groove, no subluxation with shoulder internal or external rotation.  Impression: Calcific Subacromial bursitis  Procedure: Real-time Ultrasound Guided Injection of right glenohumeral joint Device: GE Logiq E Ultrasound guided injection is preferred based studies that show increased duration, increased effect, greater accuracy, decreased procedural pain, increased response rate with ultrasound guided versus blind injection. Verbal informed consent obtained. Time-out conducted. Noted no overlying erythema, induration, or other signs of local infection. Skin prepped in a sterile fashion. Local anesthesia: Topical Ethyl chloride. With sterile technique and under real time ultrasound guidance:  Joint visualized.  23g 1  inch needle inserted posterior approach. Pictures taken for needle placement. Patient did have injection of 2 cc of 1% lidocaine, 2 cc of 0.5% Marcaine, and 1.0 cc of Kenalog 40 mg/dL. Completed without difficulty Pain immediately resolved suggesting accurate placement of the medication. Advised to call if fevers/chills, erythema, induration, drainage, or persistent bleeding. Images permanently stored and available for review in the ultrasound unit. Impression: Technically successful ultrasound guided injection.    Assessment & Plan:   No problem-specific Assessment & Plan notes found for this encounter.     Rexene Edison, NP 12/20/2016

## 2017-01-01 ENCOUNTER — Encounter: Payer: Self-pay | Admitting: Obstetrics and Gynecology

## 2017-01-01 ENCOUNTER — Ambulatory Visit (INDEPENDENT_AMBULATORY_CARE_PROVIDER_SITE_OTHER): Payer: PPO | Admitting: Obstetrics and Gynecology

## 2017-01-01 ENCOUNTER — Telehealth: Payer: Self-pay | Admitting: Obstetrics and Gynecology

## 2017-01-01 VITALS — BP 125/77 | HR 78 | Temp 98.6°F | Ht 67.0 in | Wt 219.8 lb

## 2017-01-01 DIAGNOSIS — R300 Dysuria: Secondary | ICD-10-CM

## 2017-01-01 DIAGNOSIS — N898 Other specified noninflammatory disorders of vagina: Secondary | ICD-10-CM | POA: Diagnosis not present

## 2017-01-01 DIAGNOSIS — R102 Pelvic and perineal pain: Secondary | ICD-10-CM

## 2017-01-01 MED ORDER — AZITHROMYCIN 1 G PO PACK: 1.0000 g | PACK | Freq: Once | ORAL | 0 refills | Status: AC

## 2017-01-01 MED ORDER — NITROFURANTOIN MONOHYD MACRO 100 MG PO CAPS: 100.0000 mg | ORAL_CAPSULE | Freq: Two times a day (BID) | ORAL | 0 refills | Status: DC

## 2017-01-01 NOTE — Progress Notes (Signed)
Chief complaint: 1.  Suspected UTI 2.  Vaginal discharge  Kirsten [name] presents today for evaluation of possible recurrent UTI.  She has been experiencing painful urination and frequency.  She also has been noting a vague heaviness within the vagina and vaginal discharge. Urine culture 09/27/2016-10-25,000 mixed flora Urinalysis 09/17/2016 moderate leukocyte esterase; large blood negative nitrites  Multiple life stressors have been ongoing with daughter in and out of rehab.  The patient is having to take care of her grandson while her daughter was getting care. Father recently deceased from colon cancer complications-genetically linked.  The patient may want to get genetic testing.  Past medical history: Past surgical history, problem list, medications, and allergies are reviewed  OBJECTIVE: BP 125/77   Pulse 78   Temp 98.6 F (37 C)   Ht 5' 7"  (1.702 m)   Wt 219 lb 12.8 oz (99.7 kg)   BMI 34.43 kg/m  Pleasant well-appearing female, slightly anxious, in no acute distress Back: No CVA tenderness Abdomen: No significant tenderness or peritoneal signs; multiple surgical scars are healed without evidence of hernia  Pelvic exam: Pediatric speculum Extensive head-normal BUS-normal Vagina-decreased estrogen effect; grayish yellow discharge present (new swab taken) Bimanual-no palpable mass or tenderness (single digit exam)  ASSESSMENT: 1.  UTI symptoms 2.  Vaginal leukorrhea 3.  Family history of colon cancer  PLAN: 1.  Nuswab plus 2.  UA with CNS 3.  Zithromax 1 g p.o. 4.  Macrobid twice daily for 7 days 5.  Return in 2 weeks for annual exam as scheduled 6.  Patient is to consider genetic testing for colon cancer screening due to recent diagnosis of father having a genetically associated colon cancer (deceased in 2016/10/11)  A total of 15 minutes were spent face-to-face with the patient during this encounter and over half of that time dealt with counseling and coordination of  care.  [name], MD  Note: This dictation was prepared with Dragon dictation along with smaller phrase technology. Any transcriptional errors that result from this process are unintentional.

## 2017-01-01 NOTE — Patient Instructions (Signed)
1.  Urine culture is sent 2.  Macrobid twice daily for 7 days 3.  Zithromax 1 g orally for vaginal leukorrhea 4.  New swab plus sent for ruling out vaginitis

## 2017-01-01 NOTE — Telephone Encounter (Signed)
Pt aware med erx. Approved by [facility].

## 2017-01-01 NOTE — Telephone Encounter (Signed)
The patient called and stated that she was supposed to have two prescriptions but only one medication was sent to her pharmacy. The patient would like to speak with Joyice Faster Dr. Duard Brady Nurse to clear the confusion. No other information was disclosed. Please advise.

## 2017-01-02 ENCOUNTER — Telehealth: Payer: Self-pay | Admitting: Pulmonary Disease

## 2017-01-02 ENCOUNTER — Telehealth: Payer: Self-pay

## 2017-01-02 DIAGNOSIS — G4733 Obstructive sleep apnea (adult) (pediatric): Secondary | ICD-10-CM

## 2017-01-02 DIAGNOSIS — E118 Type 2 diabetes mellitus with unspecified complications: Secondary | ICD-10-CM

## 2017-01-02 LAB — MICROSCOPIC EXAMINATION

## 2017-01-02 LAB — URINALYSIS, ROUTINE W REFLEX MICROSCOPIC
BILIRUBIN UA: NEGATIVE
Glucose, UA: NEGATIVE
Ketones, UA: NEGATIVE
NITRITE UA: POSITIVE — AB
PH UA: 5 (ref 5.0–7.5)
Specific Gravity, UA: 1.026 (ref 1.005–1.030)
UUROB: 1 mg/dL (ref 0.2–1.0)

## 2017-01-02 MED ORDER — METFORMIN HCL 1000 MG PO TABS: ORAL_TABLET | ORAL | 0 refills | Status: DC

## 2017-01-02 NOTE — Telephone Encounter (Signed)
Spoke with pt, advised I would place an order for her CPAP supplies and change DME since [facility] does not accept her insurance. Pt understood and nothing further is needed.

## 2017-01-02 NOTE — Telephone Encounter (Signed)
rf rq for metformin. 90 day supply sent erx.

## 2017-01-03 LAB — URINE CULTURE

## 2017-01-03 LAB — NUSWAB BV AND CANDIDA, NAA
CANDIDA ALBICANS, NAA: NEGATIVE
CANDIDA GLABRATA, NAA: NEGATIVE

## 2017-01-15 ENCOUNTER — Telehealth: Payer: Self-pay | Admitting: Pulmonary Disease

## 2017-01-15 DIAGNOSIS — G4733 Obstructive sleep apnea (adult) (pediatric): Secondary | ICD-10-CM

## 2017-01-15 NOTE — Telephone Encounter (Signed)
[place], [phone] ext [phone] calling stating needing itemized RX for cpap supplies.

## 2017-01-15 NOTE — Telephone Encounter (Signed)
Spoke with Barbaraann Rondo he needs the order to state  mask of choice, headgear, cushions, filters, climate control tubing and water chamber.  Order placed. Nothing further is needed.    Marland Kitchen

## 2017-01-16 ENCOUNTER — Encounter: Payer: Self-pay | Admitting: Internal Medicine

## 2017-01-16 ENCOUNTER — Other Ambulatory Visit (INDEPENDENT_AMBULATORY_CARE_PROVIDER_SITE_OTHER): Payer: PPO

## 2017-01-16 ENCOUNTER — Ambulatory Visit: Payer: PPO | Admitting: Internal Medicine

## 2017-01-16 VITALS — BP 120/70 | HR 75 | Temp 98.1°F | Resp 16 | Ht 67.0 in | Wt 222.0 lb

## 2017-01-16 DIAGNOSIS — E538 Deficiency of other specified B group vitamins: Secondary | ICD-10-CM | POA: Diagnosis not present

## 2017-01-16 DIAGNOSIS — E038 Other specified hypothyroidism: Secondary | ICD-10-CM

## 2017-01-16 DIAGNOSIS — I10 Essential (primary) hypertension: Secondary | ICD-10-CM

## 2017-01-16 DIAGNOSIS — E118 Type 2 diabetes mellitus with unspecified complications: Secondary | ICD-10-CM

## 2017-01-16 LAB — BASIC METABOLIC PANEL
BUN: 18 mg/dL (ref 6–23)
CO2: 31 mEq/L (ref 19–32)
CREATININE: 0.71 mg/dL (ref 0.40–1.20)
Calcium: 10 mg/dL (ref 8.4–10.5)
Chloride: 103 mEq/L (ref 96–112)
GFR: 89.41 mL/min (ref >60.00)
GLUCOSE: 124 mg/dL — AB (ref 70–99)
Potassium: 4.5 mEq/L (ref 3.5–5.1)
Sodium: 142 mEq/L (ref 135–145)

## 2017-01-16 LAB — HEMOGLOBIN A1C: Hgb A1c MFr Bld: 6.9 % — ABNORMAL HIGH (ref 4.6–6.5)

## 2017-01-16 LAB — TSH: TSH: 4.19 u[IU]/mL (ref 0.35–4.50)

## 2017-01-16 MED ORDER — LEVOTHYROXINE SODIUM 100 MCG PO TABS: 100.0000 ug | ORAL_TABLET | Freq: Every day | ORAL | 0 refills | Status: DC

## 2017-01-16 MED ORDER — CYANOCOBALAMIN 1000 MCG/ML IJ SOLN
1000.0000 ug | Freq: Once | INTRAMUSCULAR | Status: AC
  Administered 2017-01-16: 1000 ug via INTRAMUSCULAR

## 2017-01-16 NOTE — Progress Notes (Signed)
Subjective:  Patient ID: Kirsten [name], female    DOB: [birth year]  Age: 61 y.o. MRN: [id number]  CC: Hypothyroidism; Hypertension; and Diabetes   HPI Yariela Tison Brubeck presents for f/up -she complains of weight gain.  She tells me her blood pressure and blood sugars have been well controlled.  She otherwise feels well and offers no other complaints.  Outpatient Medications Prior to Visit  Medication Sig Dispense Refill  . amiodarone (PACERONE) 200 MG tablet Take 200 mg daily by mouth.    Marland Kitchen apixaban (ELIQUIS) 5 MG TABS tablet Take 1 tablet (5 mg total) by mouth 2 (two) times daily. 180 tablet 1  . glucose blood (ONE TOUCH ULTRA TEST) test strip Use as instructed 100 each 12  . hydrochlorothiazide (HYDRODIURIL) 25 MG tablet take 1 tablet by mouth once daily 90 tablet 1  . metFORMIN (GLUCOPHAGE) 1000 MG tablet take 1 tablet by mouth twice a day with A MEAL 180 tablet 0  . metoprolol succinate (TOPROL-XL) 50 MG 24 hr tablet Take 50 mg by mouth daily.     Marland Kitchen PARoxetine (PAXIL-CR) 25 MG 24 hr tablet   0  . Vitamin D, Ergocalciferol, (DRISDOL) 50000 units CAPS capsule Take 1 capsule (50,000 Units total) by mouth every 7 (seven) days. 12 capsule 0  . zolpidem (AMBIEN) 10 MG tablet take 1 tablet by mouth at bedtime if needed 90 tablet 1  . SYNTHROID 88 MCG tablet take 1 tablet by mouth once daily BEFORE BREAKFAST 90 tablet 1  . nitrofurantoin, macrocrystal-monohydrate, (MACROBID) 100 MG capsule Take 1 capsule (100 mg total) 2 (two) times daily by mouth. 14 capsule 0  . cyanocobalamin ((VITAMIN B-12)) injection 1,000 mcg      No facility-administered medications prior to visit.     ROS Review of Systems  Constitutional: Positive for unexpected weight change. Negative for appetite change, chills, diaphoresis and fatigue.  HENT: Negative.   Eyes: Negative for visual disturbance.  Respiratory: Negative for cough, chest tightness, shortness of breath and wheezing.   Cardiovascular: Negative for  chest pain, palpitations and leg swelling.  Gastrointestinal: Negative for abdominal pain, constipation, diarrhea, nausea and vomiting.  Endocrine: Negative.  Negative for cold intolerance, heat intolerance, polydipsia, polyphagia and polyuria.  Genitourinary: Negative.  Negative for dysuria.  Musculoskeletal: Negative.  Negative for back pain, myalgias and neck pain.  Skin: Negative for color change, pallor and rash.  Allergic/Immunologic: Negative.   Neurological: Negative.  Negative for dizziness, weakness and light-headedness.  Hematological: Negative for adenopathy. Does not bruise/bleed easily.  Psychiatric/Behavioral: Negative.     Objective:  BP 120/70 (BP Location: Left Arm, Patient Position: Sitting, Cuff Size: Large)   Pulse 75   Temp 98.1 F (36.7 C) (Oral)   Resp 16   Ht 5' 7"  (1.702 m)   Wt 222 lb (100.7 kg)   SpO2 95%   BMI 34.77 kg/m   BP Readings from Last 3 Encounters:  01/16/17 120/70  01/01/17 125/77  12/20/16 112/64    Wt Readings from Last 3 Encounters:  01/16/17 222 lb (100.7 kg)  01/01/17 219 lb 12.8 oz (99.7 kg)  12/20/16 218 lb (98.9 kg)    Physical Exam  Constitutional: She is oriented to person, place, and time. No distress.  HENT:  Mouth/Throat: Oropharynx is clear and moist. No oropharyngeal exudate.  Eyes: Conjunctivae are normal. Right eye exhibits no discharge. Left eye exhibits no discharge. No scleral icterus.  Neck: Normal range of motion. Neck supple. No JVD present. No  thyromegaly present.  Cardiovascular: Normal rate, regular rhythm and normal heart sounds. Exam reveals no gallop.  No murmur heard. Pulmonary/Chest: Effort normal and breath sounds normal. No respiratory distress. She has no wheezes. She has no rales.  Abdominal: Soft. Bowel sounds are normal. She exhibits no distension and no mass. There is no tenderness. There is no guarding.  Musculoskeletal: Normal range of motion. She exhibits no edema, tenderness or deformity.    Lymphadenopathy:    She has no cervical adenopathy.  Neurological: She is alert and oriented to person, place, and time.  Skin: Skin is warm and dry. No rash noted. She is not diaphoretic. No erythema. No pallor.  Vitals reviewed.   Lab Results  Component Value Date   WBC 9.0 09/10/2016   HGB 14.8 09/10/2016   HCT 45.0 09/10/2016   PLT 300.0 09/10/2016   GLUCOSE 124 (H) 01/16/2017   CHOL 222 (H) 09/10/2016   TRIG 243.0 (H) 09/10/2016   HDL 47.00 09/10/2016   LDLDIRECT 139.0 09/10/2016   LDLCALC SEE COMMENT 11/26/2013   ALT 28 09/10/2016   AST 22 09/10/2016   NA 142 01/16/2017   K 4.5 01/16/2017   CL 103 01/16/2017   CREATININE 0.71 01/16/2017   BUN 18 01/16/2017   CO2 31 01/16/2017   TSH 4.19 01/16/2017   INR 1.1 11/27/2013   HGBA1C 6.9 (H) 01/16/2017   MICROALBUR <0.7 09/10/2016    Mm Screening Breast Tomo Bilateral  Result Date: 10/12/2016 CLINICAL DATA:  Screening. EXAM: 2D DIGITAL SCREENING BILATERAL MAMMOGRAM WITH CAD AND ADJUNCT TOMO COMPARISON:  Previous exam(s). ACR Breast Density Category a: The breast tissue is almost entirely fatty. FINDINGS: There are no findings suspicious for malignancy. Images were processed with CAD. IMPRESSION: No mammographic evidence of malignancy. A result letter of this screening mammogram will be mailed directly to the patient. RECOMMENDATION: Screening mammogram in one year. (Code:SM-B-01Y) BI-RADS CATEGORY  1: Negative. Electronically Signed   By: Franki Cabot M.D.   On: 10/12/2016 13:45    Assessment & Plan:   Sherin was seen today for hypothyroidism, hypertension and diabetes.  Diagnoses and all orders for this visit:  Other specified hypothyroidism- Her TSH is up to 4.19.  She also complains of weight gain.  I have therefore made a slight increase in her levothyroxine dose. -     TSH; Future -     levothyroxine (SYNTHROID, LEVOTHROID) 100 MCG tablet; Take 1 tablet (100 mcg total) by mouth daily.  Type 2 diabetes mellitus  with complication, without long-term current use of insulin ([place])- Her A1c is at 6.9%.  Her blood sugars are adequately well controlled.  Will continue metformin at the current dose. -     Basic metabolic panel; Future -     Hemoglobin A1c; Future  Essential hypertension, benign- Her blood pressure is well controlled.  Electrolytes and renal function are normal. -     Basic metabolic panel; Future  Vitamin B 12 deficiency -     cyanocobalamin ((VITAMIN B-12)) injection 1,000 mcg   I have discontinued Floride A. Edman's SYNTHROID and nitrofurantoin (macrocrystal-monohydrate). I am also having her start on levothyroxine. Additionally, I am having her maintain her glucose blood, metoprolol succinate, apixaban, zolpidem, hydrochlorothiazide, Vitamin D (Ergocalciferol), amiodarone, PARoxetine, and metFORMIN. We will stop administering cyanocobalamin. Additionally, we administered cyanocobalamin.  Meds ordered this encounter  Medications  . cyanocobalamin ((VITAMIN B-12)) injection 1,000 mcg  . levothyroxine (SYNTHROID, LEVOTHROID) 100 MCG tablet    Sig: Take 1 tablet (100  mcg total) by mouth daily.    Dispense:  90 tablet    Refill:  0     Follow-up: Return in about 4 months (around 05/17/2017).  [name], MD

## 2017-01-16 NOTE — Patient Instructions (Signed)
Hypothyroidism Hypothyroidism is a disorder of the thyroid. The thyroid is a large gland that is located in the lower front of the neck. The thyroid releases hormones that control how the body works. With hypothyroidism, the thyroid does not make enough of these hormones. What are the causes? Causes of hypothyroidism may include:  Viral infections.  Pregnancy.  Your own defense system (immune system) attacking your thyroid.  Certain medicines.  Birth defects.  Past radiation treatments to your head or neck.  Past treatment with radioactive iodine.  Past surgical removal of part or all of your thyroid.  Problems with the gland that is located in the center of your brain (pituitary).  What are the signs or symptoms? Signs and symptoms of hypothyroidism may include:  Feeling as though you have no energy (lethargy).  Inability to tolerate cold.  Weight gain that is not explained by a change in diet or exercise habits.  Dry skin.  Coarse hair.  Menstrual irregularity.  Slowing of thought processes.  Constipation.  Sadness or depression.  How is this diagnosed? Your health care provider may diagnose hypothyroidism with blood tests and ultrasound tests. How is this treated? Hypothyroidism is treated with medicine that replaces the hormones that your body does not make. After you begin treatment, it may take several weeks for symptoms to go away. Follow these instructions at home:  Take medicines only as directed by your health care provider.  If you start taking any new medicines, tell your health care provider.  Keep all follow-up visits as directed by your health care provider. This is important. As your condition improves, your dosage needs may change. You will need to have blood tests regularly so that your health care provider can watch your condition. Contact a health care provider if:  Your symptoms do not get better with treatment.  You are taking thyroid  replacement medicine and: ? You sweat excessively. ? You have tremors. ? You feel anxious. ? You lose weight rapidly. ? You cannot tolerate heat. ? You have emotional swings. ? You have diarrhea. ? You feel weak. Get help right away if:  You develop chest pain.  You develop an irregular heartbeat.  You develop a rapid heartbeat. This information is not intended to replace advice given to you by your health care provider. Make sure you discuss any questions you have with your health care provider. Document Released: 01/29/2005 Document Revised: 07/07/2015 Document Reviewed: 06/16/2013 Elsevier Interactive Patient Education  2017 [employer]  

## 2017-01-17 ENCOUNTER — Telehealth: Payer: Self-pay

## 2017-01-17 NOTE — Telephone Encounter (Signed)
Kirsten [name] already sent in an order for CPAP supplies on 12.4.18 Called [facility] and spoke with [name] who looked at the order and is unsure why there is still an issue - Kirsten [name] specified all the needed supplies.  [name] will submit the order  Called spoke with patient, discussed the above Pt happy with this and voiced her understanding  Nothing further needed; will sign off

## 2017-01-28 NOTE — Progress Notes (Signed)
Patient ID: Kirsten [name], female   DOB: 1955/11/28, 61 y.o.   MRN: [id number] ANNUAL PREVENTATIVE CARE GYN  ENCOUNTER NOTE  Subjective:       Kirsten [name] is a 61 y.o.  G3 P3003. female here for a routine annual gynecologic exam.  Current com plaints: 1. None  No significant changes in bowel or bladder function.  Previously noted vaginal discharge in November 2018 and generalized malaise symptoms have resolved after antibiotic therapy; new swab taken at that time was negative.  Urine was negative for infection  Multiple life stressors are ongoing with daughter in and out of rehab.  The patient is taking care of her grandson while daughter is in rehab.  Father recently is deceased from colon cancer complications thought to be genetically linked.  The patient is to have colonoscopy in June 2019.    Gynecologic History No LMP recorded. Patient is postmenopausal. Contraception: tubal ligation Last Pap: 02/2013 -neg/neg. Results were: normal Last mammogram: 10/12/2016 birad 1 . Results were: normal  Obstetric History OB History  Gravida Para Term Preterm AB Living  3 3 3     3   SAB TAB Ectopic Multiple Live Births          3    # Outcome Date GA Lbr Len/2nd Weight Sex Delivery Anes PTL Lv  3 Term 1988   8 lb 10 oz (3.912 kg) M CS-LTranv   LIV  2 Term 1986   9 lb 4 oz (4.196 kg) F CS-LTranv   LIV  1 Term 1983   8 lb 13 oz (3.997 kg) F CS-LTranv   LIV     Complications: Breech birth      Past Medical History:  Diagnosis Date  . A-fib ([place])   . Anxiety disorder   . Asthma   . Gastroschisis    umphalocele, rotated organs abd repair until age 89  . GERD (gastroesophageal reflux disease)   . HTN (hypertension)   . Hyperlipidemia   . IBS (irritable bowel syndrome)   . Morbid obesity ([place])    Target wt - 185  for BMI < 30  . Obesity   . Pulmonary fibrosis ([place])   . SBO (small bowel obstruction) ([place])    Resolved with NG/Bowel rest around 2009  . Sleep apnea   . Type II or  unspecified type diabetes mellitus without mention of complication, not stated as uncontrolled     Past Surgical History:  Procedure Laterality Date  . BREAST EXCISIONAL BIOPSY Left 03/19/2012   neg  . CESAREAN SECTION    . CHOLECYSTECTOMY  1992  . Newborn Surgery - GI - ORGANS OUTSIDE ABDOMEN    . Small Bowel Repair    . TUBAL LIGATION  1988    Current Outpatient Medications on File Prior to Visit  Medication Sig Dispense Refill  . amiodarone (PACERONE) 200 MG tablet Take 200 mg daily by mouth.    Marland Kitchen apixaban (ELIQUIS) 5 MG TABS tablet Take 1 tablet (5 mg total) by mouth 2 (two) times daily. 180 tablet 1  . glucose blood (ONE TOUCH ULTRA TEST) test strip Use as instructed 100 each 12  . hydrochlorothiazide (HYDRODIURIL) 25 MG tablet take 1 tablet by mouth once daily 90 tablet 1  . levothyroxine (SYNTHROID, LEVOTHROID) 100 MCG tablet Take 1 tablet (100 mcg total) by mouth daily. 90 tablet 0  . metFORMIN (GLUCOPHAGE) 1000 MG tablet take 1 tablet by mouth twice a day with A MEAL 180 tablet 0  .  metoprolol succinate (TOPROL-XL) 50 MG 24 hr tablet Take 50 mg by mouth daily.     Marland Kitchen PARoxetine (PAXIL-CR) 25 MG 24 hr tablet   0  . Vitamin D, Ergocalciferol, (DRISDOL) 50000 units CAPS capsule Take 1 capsule (50,000 Units total) by mouth every 7 (seven) days. 12 capsule 0  . zolpidem (AMBIEN) 10 MG tablet take 1 tablet by mouth at bedtime if needed 90 tablet 1   No current facility-administered medications on file prior to visit.     Allergies  Allergen Reactions  . Factive [Gemifloxacin Mesylate] Rash  . Crestor [Rosuvastatin]     GI upset  . Sulfonamide Derivatives     REACTION: rash  . Gemifloxacin Rash  . Penicillins Hives and Rash    Has patient had a PCN reaction causing immediate rash, facial/tongue/throat swelling, SOB or lightheadedness with hypotension: No Has patient had a PCN reaction causing severe rash involving mucus membranes or skin necrosis: No Has patient had a PCN  reaction that required hospitalization No Has patient had a PCN reaction occurring within the last 10 years: No If all of the above answers are "NO", then may proceed with Cephalosporin use.     Social History   Socioeconomic History  . Marital status: Married    Spouse name: Not on file  . Number of children: Not on file  . Years of education: Not on file  . Highest education level: Not on file  Social Needs  . Financial resource strain: Not on file  . Food insecurity - worry: Not on file  . Food insecurity - inability: Not on file  . Transportation needs - medical: Not on file  . Transportation needs - non-medical: Not on file  Occupational History  . Occupation: Retired    Comment: Research scientist (physical sciences) at [employer]  . Smoking status: Never Smoker  . Smokeless tobacco: Never Used  . Tobacco comment: "tried as a teen"  Substance and Sexual Activity  . Alcohol use: Yes    Alcohol/week: 1.2 oz    Types: 2 Glasses of wine per week    Comment: rare  . Drug use: No  . Sexual activity: Yes    Birth control/protection: Surgical  Other Topics Concern  . Not on file  Social History Narrative   Regular Exercise -  YES   Daily Caffeine Use:  2   Active and independent at baseline       Family History  Problem Relation Age of Onset  . Lymphoma Mother   . Diabetes type II Father   . Colon cancer Father   . Diabetes type I Son   . Diabetes Maternal Grandmother   . Goiter Neg Hx   . Breast cancer Neg Hx     The following portions of the patient's history were reviewed and updated as appropriate: allergies, current medications, past family history, past medical history, past social history, past surgical history and problem list.  Review of Systems Review of Systems  Constitutional: Negative.   HENT: Negative.   Eyes: Negative.   Respiratory: Negative.   Cardiovascular: Negative.   Gastrointestinal: Negative.   Genitourinary: Negative.    Musculoskeletal: Negative.   Skin: Negative.   Neurological: Negative.   Endo/Heme/Allergies: Negative.   Psychiatric/Behavioral: The patient is nervous/anxious.     Objective:   BP 131/74   Pulse 75   Ht 5' 7"  (1.702 m)   Wt 219 lb 8 oz (99.6 kg)   BMI 34.38 kg/m  CONSTITUTIONAL: Well-developed, well-nourished female in no acute distress.  PSYCHIATRIC: Normal mood and affect. Normal behavior. Normal judgment and thought content. [place]: Alert and oriented to person, place, and time. Normal muscle tone coordination. No cranial nerve deficit noted. HENT:  Normocephalic, atraumatic, External right and left ear normal. Oropharynx is clear and moist EYES: Conjunctivae and EOM are normal. No scleral icterus.  NECK: Normal range of motion, supple, no masses.  Normal thyroid.  SKIN: Skin is warm and dry. No rash noted. Not diaphoretic. No erythema. No pallor. CARDIOVASCULAR: Normal heart rate noted, regular rhythm, no murmur. RESPIRATORY: Clear to auscultation bilaterally. Effort and breath sounds normal, no problems with respiration noted. BREASTS: Symmetric in size. No masses, skin changes, nipple drainage, or lymphadenopathy. ABDOMEN: Soft, normal bowel sounds, no distention noted.  No tenderness, rebound or guarding. Multiple surgical scars healed BLADDER: Normal PELVIC: (Pediatric speculum)  External Genitalia: Normal  BUS: Normal  Vagina: Atrophic; introitus narrowed, requiring pediatric speculum; single digit exam  Cervix: Normal; no cervical motion tenderness  Uterus: Midplane, not enlarged, nontender  Adnexa: Nonpalpable, nontender  RV: External Exam NormaI internal exam deferred due to upcoming colonoscopy MUSCULOSKELETAL: Normal range of motion. No tenderness.  No cyanosis, clubbing, or edema.  2+ distal pulses. LYMPHATIC: No Axillary, Supraclavicular, or Inguinal Adenopathy.    Assessment:   Annual gynecologic examination 61 y.o. Contraception: tubal  ligation bmi-34. Menopausal state, Asymptomatic. History of recurrent UTI, without symptoms today Vaginal outlet stenosis Vaginal atrophy, asymptomatic Life stressors, stable   Plan:  Pap: Pap/hpv Mammogram: UTD Stool Guaiac Testing:  Colonoscopy due 07/2017 Labs: thru pcp Routine preventative health maintenance measures emphasized: Exercise/Diet/Weight control, Tobacco Warnings, Alcohol/Substance use risks and Stress Management  Mindfulness therapy discussed; patient has been going to Al-Anon with daughter Return to [place], CMA  [name], MD   Note: This dictation was prepared with [employer] dictation along with smaller Company secretary. Any transcriptional errors that result from this process are unintentional.

## 2017-01-29 DIAGNOSIS — G4733 Obstructive sleep apnea (adult) (pediatric): Secondary | ICD-10-CM | POA: Diagnosis not present

## 2017-01-31 ENCOUNTER — Ambulatory Visit (INDEPENDENT_AMBULATORY_CARE_PROVIDER_SITE_OTHER): Payer: PPO | Admitting: Obstetrics and Gynecology

## 2017-01-31 ENCOUNTER — Encounter: Payer: Self-pay | Admitting: Obstetrics and Gynecology

## 2017-01-31 VITALS — BP 131/74 | HR 75 | Ht 67.0 in | Wt 219.5 lb

## 2017-01-31 DIAGNOSIS — N952 Postmenopausal atrophic vaginitis: Secondary | ICD-10-CM | POA: Diagnosis not present

## 2017-01-31 DIAGNOSIS — Z01419 Encounter for gynecological examination (general) (routine) without abnormal findings: Secondary | ICD-10-CM | POA: Diagnosis not present

## 2017-01-31 DIAGNOSIS — F411 Generalized anxiety disorder: Secondary | ICD-10-CM | POA: Diagnosis not present

## 2017-01-31 DIAGNOSIS — N895 Stricture and atresia of vagina: Secondary | ICD-10-CM

## 2017-01-31 DIAGNOSIS — Z1211 Encounter for screening for malignant neoplasm of colon: Secondary | ICD-10-CM | POA: Diagnosis not present

## 2017-01-31 DIAGNOSIS — E6609 Other obesity due to excess calories: Secondary | ICD-10-CM

## 2017-01-31 DIAGNOSIS — Z87440 Personal history of urinary (tract) infections: Secondary | ICD-10-CM

## 2017-01-31 DIAGNOSIS — Z1231 Encounter for screening mammogram for malignant neoplasm of breast: Secondary | ICD-10-CM

## 2017-01-31 DIAGNOSIS — Z1239 Encounter for other screening for malignant neoplasm of breast: Secondary | ICD-10-CM

## 2017-01-31 DIAGNOSIS — Z6835 Body mass index (BMI) 35.0-35.9, adult: Secondary | ICD-10-CM

## 2017-01-31 LAB — HM PAP SMEAR

## 2017-01-31 NOTE — Patient Instructions (Signed)
1.  Pap smear is done 2.  Mammogram already obtained 3.  Stool guaiac testing is deferred due to colonoscopy which is to be performed in 07/2017 4.  Screening labs are obtained through primary care 5.  Continue with healthy eating and exercise with control weight reduction 6.  Return in 1 year for annual exam   Health Maintenance for Postmenopausal Women Menopause is a normal process in which your reproductive ability comes to an end. This process happens gradually over a span of months to years, usually between the ages of 60 and 36. Menopause is complete when you have missed 12 consecutive menstrual periods. It is important to talk with your health care provider about some of the most common conditions that affect postmenopausal women, such as heart disease, cancer, and bone loss (osteoporosis). Adopting a healthy lifestyle and getting preventive care can help to promote your health and wellness. Those actions can also lower your chances of developing some of these common conditions. What should I know about menopause? During menopause, you may experience a number of symptoms, such as:  Moderate-to-severe hot flashes.  Night sweats.  Decrease in sex drive.  Mood swings.  Headaches.  Tiredness.  Irritability.  Memory problems.  Insomnia.  Choosing to treat or not to treat menopausal changes is an individual decision that you make with your health care provider. What should I know about hormone replacement therapy and supplements? Hormone therapy products are effective for treating symptoms that are associated with menopause, such as hot flashes and night sweats. Hormone replacement carries certain risks, especially as you become older. If you are thinking about using estrogen or estrogen with progestin treatments, discuss the benefits and risks with your health care provider. What should I know about heart disease and stroke? Heart disease, heart attack, and stroke become more  likely as you age. This may be due, in part, to the hormonal changes that your body experiences during menopause. These can affect how your body processes dietary fats, triglycerides, and cholesterol. Heart attack and stroke are both medical emergencies. There are many things that you can do to help prevent heart disease and stroke:  Have your blood pressure checked at least every 1-2 years. High blood pressure causes heart disease and increases the risk of stroke.  If you are 5-56 years old, ask your health care provider if you should take aspirin to prevent a heart attack or a stroke.  Do not use any tobacco products, including cigarettes, chewing tobacco, or electronic cigarettes. If you need help quitting, ask your health care provider.  It is important to eat a healthy diet and maintain a healthy weight. ? Be sure to include plenty of vegetables, fruits, low-fat dairy products, and lean protein. ? Avoid eating foods that are high in solid fats, added sugars, or salt (sodium).  Get regular exercise. This is one of the most important things that you can do for your health. ? Try to exercise for at least 150 minutes each week. The type of exercise that you do should increase your heart rate and make you sweat. This is known as moderate-intensity exercise. ? Try to do strengthening exercises at least twice each week. Do these in addition to the moderate-intensity exercise.  Know your numbers.Ask your health care provider to check your cholesterol and your blood glucose. Continue to have your blood tested as directed by your health care provider.  What should I know about cancer screening? There are several types of cancer. Take  the following steps to reduce your risk and to catch any cancer development as early as possible. Breast Cancer  Practice breast self-awareness. ? This means understanding how your breasts normally appear and feel. ? It also means doing regular breast self-exams.  Let your health care provider know about any changes, no matter how small.  If you are 62 or older, have a clinician do a breast exam (clinical breast exam or CBE) every year. Depending on your age, family history, and medical history, it may be recommended that you also have a yearly breast X-ray (mammogram).  If you have a family history of breast cancer, talk with your health care provider about genetic screening.  If you are at high risk for breast cancer, talk with your health care provider about having an MRI and a mammogram every year.  Breast cancer (BRCA) gene test is recommended for women who have family members with BRCA-related cancers. Results of the assessment will determine the need for genetic counseling and BRCA1 and for BRCA2 testing. BRCA-related cancers include these types: ? Breast. This occurs in males or females. ? Ovarian. ? Tubal. This may also be called fallopian tube cancer. ? Cancer of the abdominal or pelvic lining (peritoneal cancer). ? Prostate. ? Pancreatic.  Cervical, Uterine, and Ovarian Cancer Your health care provider may recommend that you be screened regularly for cancer of the pelvic organs. These include your ovaries, uterus, and vagina. This screening involves a pelvic exam, which includes checking for microscopic changes to the surface of your cervix (Pap test).  For women ages 21-65, health care providers may recommend a pelvic exam and a Pap test every three years. For women ages 52-65, they may recommend the Pap test and pelvic exam, combined with testing for human papilloma virus (HPV), every five years. Some types of HPV increase your risk of cervical cancer. Testing for HPV may also be done on women of any age who have unclear Pap test results.  Other health care providers may not recommend any screening for nonpregnant women who are considered low risk for pelvic cancer and have no symptoms. Ask your health care provider if a screening pelvic exam  is right for you.  If you have had past treatment for cervical cancer or a condition that could lead to cancer, you need Pap tests and screening for cancer for at least 20 years after your treatment. If Pap tests have been discontinued for you, your risk factors (such as having a new sexual partner) need to be reassessed to determine if you should start having screenings again. Some women have medical problems that increase the chance of getting cervical cancer. In these cases, your health care provider may recommend that you have screening and Pap tests more often.  If you have a family history of uterine cancer or ovarian cancer, talk with your health care provider about genetic screening.  If you have vaginal bleeding after reaching menopause, tell your health care provider.  There are currently no reliable tests available to screen for ovarian cancer.  Lung Cancer Lung cancer screening is recommended for adults 58-43 years old who are at high risk for lung cancer because of a history of smoking. A yearly low-dose CT scan of the lungs is recommended if you:  Currently smoke.  Have a history of at least 30 pack-years of smoking and you currently smoke or have quit within the past 15 years. A pack-year is smoking an average of one pack of cigarettes per  day for one year.  Yearly screening should:  Continue until it has been 15 years since you quit.  Stop if you develop a health problem that would prevent you from having lung cancer treatment.  Colorectal Cancer  This type of cancer can be detected and can often be prevented.  Routine colorectal cancer screening usually begins at age 51 and continues through age 44.  If you have risk factors for colon cancer, your health care provider may recommend that you be screened at an earlier age.  If you have a family history of colorectal cancer, talk with your health care provider about genetic screening.  Your health care provider may also  recommend using home test kits to check for hidden blood in your stool.  A small camera at the end of a tube can be used to examine your colon directly (sigmoidoscopy or colonoscopy). This is done to check for the earliest forms of colorectal cancer.  Direct examination of the colon should be repeated every 5-10 years until age 47. However, if early forms of precancerous polyps or small growths are found or if you have a family history or genetic risk for colorectal cancer, you may need to be screened more often.  Skin Cancer  Check your skin from head to toe regularly.  Monitor any moles. Be sure to tell your health care provider: ? About any new moles or changes in moles, especially if there is a change in a mole's shape or color. ? If you have a mole that is larger than the size of a pencil eraser.  If any of your family members has a history of skin cancer, especially at a young age, talk with your health care provider about genetic screening.  Always use sunscreen. Apply sunscreen liberally and repeatedly throughout the day.  Whenever you are outside, protect yourself by wearing long sleeves, pants, a wide-brimmed hat, and sunglasses.  What should I know about osteoporosis? Osteoporosis is a condition in which bone destruction happens more quickly than new bone creation. After menopause, you may be at an increased risk for osteoporosis. To help prevent osteoporosis or the bone fractures that can happen because of osteoporosis, the following is recommended:  If you are 8-51 years old, get at least 1,000 mg of calcium and at least 600 mg of vitamin D per day.  If you are older than age 61 but younger than age 54, get at least 1,200 mg of calcium and at least 600 mg of vitamin D per day.  If you are older than age 63, get at least 1,200 mg of calcium and at least 800 mg of vitamin D per day.  Smoking and excessive alcohol intake increase the risk of osteoporosis. Eat foods that are  rich in calcium and vitamin D, and do weight-bearing exercises several times each week as directed by your health care provider. What should I know about how menopause affects my mental health? Depression may occur at any age, but it is more common as you become older. Common symptoms of depression include:  Low or sad mood.  Changes in sleep patterns.  Changes in appetite or eating patterns.  Feeling an overall lack of motivation or enjoyment of activities that you previously enjoyed.  Frequent crying spells.  Talk with your health care provider if you think that you are experiencing depression. What should I know about immunizations? It is important that you get and maintain your immunizations. These include:  Tetanus, diphtheria, and pertussis (  Tdap) booster vaccine.  Influenza every year before the flu season begins.  Pneumonia vaccine.  Shingles vaccine.  Your health care provider may also recommend other immunizations. This information is not intended to replace advice given to you by your health care provider. Make sure you discuss any questions you have with your health care provider. Document Released: 03/23/2005 Document Revised: 08/19/2015 Document Reviewed: 11/02/2014 Elsevier Interactive Patient Education  2018 [employer].

## 2017-02-06 LAB — IGP, COBASHPV16/18
HPV 16: NEGATIVE
HPV 18: NEGATIVE
HPV OTHER HR TYPES: NEGATIVE
PAP Smear Comment: 0

## 2017-02-22 DIAGNOSIS — G4733 Obstructive sleep apnea (adult) (pediatric): Secondary | ICD-10-CM | POA: Diagnosis not present

## 2017-02-26 DIAGNOSIS — I2510 Atherosclerotic heart disease of native coronary artery without angina pectoris: Secondary | ICD-10-CM | POA: Diagnosis not present

## 2017-02-26 DIAGNOSIS — R0602 Shortness of breath: Secondary | ICD-10-CM | POA: Diagnosis not present

## 2017-02-26 DIAGNOSIS — I10 Essential (primary) hypertension: Secondary | ICD-10-CM | POA: Diagnosis not present

## 2017-02-26 DIAGNOSIS — I4891 Unspecified atrial fibrillation: Secondary | ICD-10-CM | POA: Diagnosis not present

## 2017-03-06 DIAGNOSIS — I4891 Unspecified atrial fibrillation: Secondary | ICD-10-CM | POA: Diagnosis not present

## 2017-03-06 DIAGNOSIS — I480 Paroxysmal atrial fibrillation: Secondary | ICD-10-CM | POA: Diagnosis not present

## 2017-03-06 DIAGNOSIS — I340 Nonrheumatic mitral (valve) insufficiency: Secondary | ICD-10-CM | POA: Diagnosis not present

## 2017-03-06 DIAGNOSIS — R0602 Shortness of breath: Secondary | ICD-10-CM | POA: Diagnosis not present

## 2017-03-06 DIAGNOSIS — R55 Syncope and collapse: Secondary | ICD-10-CM | POA: Diagnosis not present

## 2017-03-06 DIAGNOSIS — R42 Dizziness and giddiness: Secondary | ICD-10-CM | POA: Diagnosis not present

## 2017-03-11 DIAGNOSIS — R42 Dizziness and giddiness: Secondary | ICD-10-CM | POA: Diagnosis not present

## 2017-03-13 DIAGNOSIS — R002 Palpitations: Secondary | ICD-10-CM | POA: Diagnosis not present

## 2017-03-13 DIAGNOSIS — I4891 Unspecified atrial fibrillation: Secondary | ICD-10-CM | POA: Diagnosis not present

## 2017-03-13 DIAGNOSIS — I340 Nonrheumatic mitral (valve) insufficiency: Secondary | ICD-10-CM | POA: Diagnosis not present

## 2017-03-13 DIAGNOSIS — R55 Syncope and collapse: Secondary | ICD-10-CM | POA: Diagnosis not present

## 2017-03-18 ENCOUNTER — Other Ambulatory Visit: Payer: Self-pay | Admitting: Internal Medicine

## 2017-03-21 ENCOUNTER — Other Ambulatory Visit (INDEPENDENT_AMBULATORY_CARE_PROVIDER_SITE_OTHER): Payer: PPO

## 2017-03-21 ENCOUNTER — Ambulatory Visit (INDEPENDENT_AMBULATORY_CARE_PROVIDER_SITE_OTHER): Payer: PPO | Admitting: Internal Medicine

## 2017-03-21 ENCOUNTER — Encounter: Payer: Self-pay | Admitting: Internal Medicine

## 2017-03-21 VITALS — BP 138/76 | HR 74 | Temp 98.0°F | Ht 67.0 in | Wt 221.0 lb

## 2017-03-21 DIAGNOSIS — E785 Hyperlipidemia, unspecified: Secondary | ICD-10-CM

## 2017-03-21 DIAGNOSIS — E041 Nontoxic single thyroid nodule: Secondary | ICD-10-CM | POA: Insufficient documentation

## 2017-03-21 DIAGNOSIS — E039 Hypothyroidism, unspecified: Secondary | ICD-10-CM

## 2017-03-21 DIAGNOSIS — Z6834 Body mass index (BMI) 34.0-34.9, adult: Secondary | ICD-10-CM

## 2017-03-21 DIAGNOSIS — G4730 Sleep apnea, unspecified: Secondary | ICD-10-CM | POA: Diagnosis not present

## 2017-03-21 DIAGNOSIS — E118 Type 2 diabetes mellitus with unspecified complications: Secondary | ICD-10-CM

## 2017-03-21 DIAGNOSIS — E538 Deficiency of other specified B group vitamins: Secondary | ICD-10-CM | POA: Diagnosis not present

## 2017-03-21 DIAGNOSIS — G4700 Insomnia, unspecified: Secondary | ICD-10-CM | POA: Insufficient documentation

## 2017-03-21 DIAGNOSIS — E781 Pure hyperglyceridemia: Secondary | ICD-10-CM

## 2017-03-21 DIAGNOSIS — E669 Obesity, unspecified: Secondary | ICD-10-CM | POA: Diagnosis not present

## 2017-03-21 LAB — LIPID PANEL
CHOLESTEROL: 189 mg/dL (ref 0–200)
HDL: 43.6 mg/dL (ref >39.00)
NonHDL: 145.35
Total CHOL/HDL Ratio: 4
Triglycerides: 236 mg/dL — ABNORMAL HIGH (ref 0.0–149.0)
VLDL: 47.2 mg/dL — AB (ref 0.0–40.0)

## 2017-03-21 LAB — TSH: TSH: 2.98 u[IU]/mL (ref 0.35–4.50)

## 2017-03-21 LAB — LDL CHOLESTEROL, DIRECT: Direct LDL: 119 mg/dL

## 2017-03-21 MED ORDER — CYANOCOBALAMIN 1000 MCG/ML IJ SOLN
1000.0000 ug | Freq: Once | INTRAMUSCULAR | Status: AC
  Administered 2017-03-21: 1000 ug via INTRAMUSCULAR

## 2017-03-21 MED ORDER — ATORVASTATIN CALCIUM 40 MG PO TABS: 40.0000 mg | ORAL_TABLET | Freq: Every day | ORAL | 1 refills | Status: DC

## 2017-03-21 MED ORDER — HYDROCHLOROTHIAZIDE 25 MG PO TABS: 25.0000 mg | ORAL_TABLET | Freq: Every day | ORAL | 1 refills | Status: DC

## 2017-03-21 MED ORDER — SUVOREXANT 20 MG PO TABS: 1.0000 | ORAL_TABLET | Freq: Every day | ORAL | 5 refills | Status: DC

## 2017-03-21 MED ORDER — METFORMIN HCL 1000 MG PO TABS: ORAL_TABLET | ORAL | 0 refills | Status: DC

## 2017-03-21 NOTE — Progress Notes (Signed)
Subjective:  Patient ID: Kirsten [name], female    DOB: Oct 11, 1955  Age: 62 y.o. MRN: [id number]  CC: Follow-up (CT scan, thyroid nodule); Hypothyroidism; Hypertension; and Hyperlipidemia   HPI Kirsten [name] presents for f/up - she recently had a CT angio done of her carotid arteries that showed minimal atherosclerosis.  There was an incidental finding of a subcentimeter nodule over her left lower thyroid gland.  She has no pain or swelling in the area.  She complains of persistent insomnia and tells me that Ambien is not helping.  She has daytime fatigue and feels foggy from the insomnia.  She is not currently taking a statin.  She previously tried Crestor but claims that it caused diarrhea.  Outpatient Medications Prior to Visit  Medication Sig Dispense Refill  . amiodarone (PACERONE) 200 MG tablet Take 200 mg daily by mouth.    Marland Kitchen apixaban (ELIQUIS) 5 MG TABS tablet Take 1 tablet (5 mg total) by mouth 2 (two) times daily. 180 tablet 1  . glucose blood (ONE TOUCH ULTRA TEST) test strip Use as instructed 100 each 12  . levothyroxine (SYNTHROID, LEVOTHROID) 100 MCG tablet Take 1 tablet (100 mcg total) by mouth daily. 90 tablet 0  . metoprolol succinate (TOPROL-XL) 50 MG 24 hr tablet Take 50 mg by mouth daily.     Marland Kitchen PARoxetine (PAXIL-CR) 25 MG 24 hr tablet Take 1 tablet (25 mg total) by mouth daily. 90 tablet 1  . Vitamin D, Ergocalciferol, (DRISDOL) 50000 units CAPS capsule Take 1 capsule (50,000 Units total) by mouth every 7 (seven) days. 12 capsule 0  . hydrochlorothiazide (HYDRODIURIL) 25 MG tablet take 1 tablet by mouth once daily 90 tablet 1  . metFORMIN (GLUCOPHAGE) 1000 MG tablet take 1 tablet by mouth twice a day with A MEAL 180 tablet 0  . zolpidem (AMBIEN) 10 MG tablet take 1 tablet by mouth at bedtime if needed 90 tablet 1   No facility-administered medications prior to visit.     ROS Review of Systems  Constitutional: Positive for fatigue. Negative for activity change,  appetite change, diaphoresis and unexpected weight change.  HENT: Negative.  Negative for sore throat, trouble swallowing and voice change.   Eyes: Negative.   Respiratory: Negative.  Negative for cough, chest tightness, shortness of breath and wheezing.   Cardiovascular: Negative for chest pain, palpitations and leg swelling.  Gastrointestinal: Negative for abdominal pain, constipation, diarrhea, nausea and vomiting.  Endocrine: Negative for cold intolerance and heat intolerance.  Genitourinary: Negative.  Negative for difficulty urinating, dysuria and hematuria.  Musculoskeletal: Negative.  Negative for arthralgias and myalgias.  Skin: Negative.  Negative for color change and rash.  Allergic/Immunologic: Negative.   Neurological: Negative.  Negative for dizziness, weakness and headaches.  Hematological: Negative for adenopathy. Does not bruise/bleed easily.  Psychiatric/Behavioral: Positive for dysphoric mood and sleep disturbance. Negative for decreased concentration and suicidal ideas. The patient is not nervous/anxious and is not hyperactive.     Objective:  BP 138/76 (BP Location: Left Arm, Patient Position: Sitting, Cuff Size: Large)   Pulse 74   Temp 98 F (36.7 C) (Oral)   Ht 5' 7"  (1.702 m)   Wt 221 lb (100.2 kg)   SpO2 96%   BMI 34.61 kg/m   BP Readings from Last 3 Encounters:  03/21/17 138/76  01/31/17 131/74  01/16/17 120/70    Wt Readings from Last 3 Encounters:  03/21/17 221 lb (100.2 kg)  01/31/17 219 lb 8 oz (99.6 kg)  01/16/17 222 lb (100.7 kg)    Physical Exam  Constitutional: She is oriented to person, place, and time. No distress.  HENT:  Mouth/Throat: Oropharynx is clear and moist. No oropharyngeal exudate.  Eyes: Conjunctivae are normal. Left eye exhibits no discharge. No scleral icterus.  Neck: Normal range of motion. Neck supple. No JVD present. No thyroid mass and no thyromegaly present.  Cardiovascular: Normal rate, regular rhythm and normal  heart sounds. Exam reveals no gallop.  No murmur heard. Pulmonary/Chest: Effort normal and breath sounds normal. No respiratory distress. She has no wheezes. She has no rales.  Abdominal: Soft. Bowel sounds are normal. She exhibits no distension and no mass. There is no tenderness.  Musculoskeletal: Normal range of motion. She exhibits no edema, tenderness or deformity.  Lymphadenopathy:    She has no cervical adenopathy.  Neurological: She is alert and oriented to person, place, and time.  Skin: Skin is warm and dry. No rash noted. She is not diaphoretic. No erythema. No pallor.  Psychiatric: She has a normal mood and affect. Her behavior is normal. Judgment and thought content normal.  Vitals reviewed.   Lab Results  Component Value Date   WBC 9.0 09/10/2016   HGB 14.8 09/10/2016   HCT 45.0 09/10/2016   PLT 300.0 09/10/2016   GLUCOSE 124 (H) 01/16/2017   CHOL 189 03/21/2017   TRIG 236.0 (H) 03/21/2017   HDL 43.60 03/21/2017   LDLDIRECT 119.0 03/21/2017   LDLCALC SEE COMMENT 11/26/2013   ALT 28 09/10/2016   AST 22 09/10/2016   NA 142 01/16/2017   K 4.5 01/16/2017   CL 103 01/16/2017   CREATININE 0.71 01/16/2017   BUN 18 01/16/2017   CO2 31 01/16/2017   TSH 2.98 03/21/2017   INR 1.1 11/27/2013   HGBA1C 6.9 (H) 01/16/2017   MICROALBUR <0.7 09/10/2016    Mm Screening Breast Tomo Bilateral  Result Date: 10/12/2016 CLINICAL DATA:  Screening. EXAM: 2D DIGITAL SCREENING BILATERAL MAMMOGRAM WITH CAD AND ADJUNCT TOMO COMPARISON:  Previous exam(s). ACR Breast Density Category a: The breast tissue is almost entirely fatty. FINDINGS: There are no findings suspicious for malignancy. Images were processed with CAD. IMPRESSION: No mammographic evidence of malignancy. A result letter of this screening mammogram will be mailed directly to the patient. RECOMMENDATION: Screening mammogram in one year. (Code:SM-B-01Y) BI-RADS CATEGORY  1: Negative. Electronically Signed   By: Franki Cabot M.D.    On: 10/12/2016 13:45    Assessment & Plan:   Amahia was seen today for follow-up, hypothyroidism, hypertension and hyperlipidemia.  Diagnoses and all orders for this visit:  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- I have ordered an ultrasound to see if the nodule has features concerning for thyroid cancer. -     US THYROID; Future  Type 2 diabetes mellitus with complication, without long-term current use of insulin ([place])- Her recent A1c was 6.9%.  Her blood sugars are adequately well controlled. -     metFORMIN (GLUCOPHAGE) 1000 MG tablet; take 1 tablet by mouth twice a day with A MEAL  Acquired hypothyroidism- Her TSH is in the normal range.  She will remain on the current dose of levothyroxine. -     TSH; Future  Vitamin B 12 deficiency -     cyanocobalamin ((VITAMIN B-12)) injection 1,000 mcg  Insomnia w/ sleep apnea -     Suvorexant (BELSOMRA) 20 MG TABS; Take 1 tablet by mouth at bedtime.  Hyperlipidemia with target LDL less than 100- She has  an elevated ASCVD risk score so I have asked her to start a statin for CV risk reduction. -     Lipid panel; Future -     atorvastatin (LIPITOR) 40 MG tablet; Take 1 tablet (40 mg total) by mouth daily.  Hypertriglyceridemia -improvement noted.  Her trigs are mildly elevated.  Medical therapy is not warranted at this time. -     Lipid panel; Future  Class 1 obesity with serious comorbidity and body mass index (BMI) of 34.0 to 34.9 in adult, unspecified obesity type  Other orders -     hydrochlorothiazide (HYDRODIURIL) 25 MG tablet; Take 1 tablet (25 mg total) by mouth daily.   I have discontinued Jadalynn A. Corvin's zolpidem. I have also changed her hydrochlorothiazide. Additionally, I am having her start on Suvorexant and atorvastatin. Lastly, I am having her maintain her glucose blood, metoprolol succinate, apixaban, Vitamin D (Ergocalciferol), amiodarone, levothyroxine, PARoxetine, and metFORMIN. We administered  cyanocobalamin.  Meds ordered this encounter  Medications  . hydrochlorothiazide (HYDRODIURIL) 25 MG tablet    Sig: Take 1 tablet (25 mg total) by mouth daily.    Dispense:  90 tablet    Refill:  1  . metFORMIN (GLUCOPHAGE) 1000 MG tablet    Sig: take 1 tablet by mouth twice a day with A MEAL    Dispense:  180 tablet    Refill:  0  . cyanocobalamin ((VITAMIN B-12)) injection 1,000 mcg  . Suvorexant (BELSOMRA) 20 MG TABS    Sig: Take 1 tablet by mouth at bedtime.    Dispense:  30 tablet    Refill:  5  . atorvastatin (LIPITOR) 40 MG tablet    Sig: Take 1 tablet (40 mg total) by mouth daily.    Dispense:  90 tablet    Refill:  1     Follow-up: Return in about 4 months (around 07/19/2017).  [name], MD

## 2017-03-21 NOTE — Patient Instructions (Signed)
Hypothyroidism Hypothyroidism is a disorder of the thyroid. The thyroid is a large gland that is located in the lower front of the neck. The thyroid releases hormones that control how the body works. With hypothyroidism, the thyroid does not make enough of these hormones. What are the causes? Causes of hypothyroidism may include:  Viral infections.  Pregnancy.  Your own defense system (immune system) attacking your thyroid.  Certain medicines.  Birth defects.  Past radiation treatments to your head or neck.  Past treatment with radioactive iodine.  Past surgical removal of part or all of your thyroid.  Problems with the gland that is located in the center of your brain (pituitary).  What are the signs or symptoms? Signs and symptoms of hypothyroidism may include:  Feeling as though you have no energy (lethargy).  Inability to tolerate cold.  Weight gain that is not explained by a change in diet or exercise habits.  Dry skin.  Coarse hair.  Menstrual irregularity.  Slowing of thought processes.  Constipation.  Sadness or depression.  How is this diagnosed? Your health care provider may diagnose hypothyroidism with blood tests and ultrasound tests. How is this treated? Hypothyroidism is treated with medicine that replaces the hormones that your body does not make. After you begin treatment, it may take several weeks for symptoms to go away. Follow these instructions at home:  Take medicines only as directed by your health care provider.  If you start taking any new medicines, tell your health care provider.  Keep all follow-up visits as directed by your health care provider. This is important. As your condition improves, your dosage needs may change. You will need to have blood tests regularly so that your health care provider can watch your condition. Contact a health care provider if:  Your symptoms do not get better with treatment.  You are taking thyroid  replacement medicine and: ? You sweat excessively. ? You have tremors. ? You feel anxious. ? You lose weight rapidly. ? You cannot tolerate heat. ? You have emotional swings. ? You have diarrhea. ? You feel weak. Get help right away if:  You develop chest pain.  You develop an irregular heartbeat.  You develop a rapid heartbeat. This information is not intended to replace advice given to you by your health care provider. Make sure you discuss any questions you have with your health care provider. Document Released: 01/29/2005 Document Revised: 07/07/2015 Document Reviewed: 06/16/2013 Elsevier Interactive Patient Education  2018 [employer] Inc[employer]  

## 2017-03-26 ENCOUNTER — Telehealth: Payer: Self-pay | Admitting: Internal Medicine

## 2017-03-26 NOTE — Telephone Encounter (Signed)
Copied from [place]. Topic: Quick Communication - See Telephone Encounter >> Mar 26, 2017  3:24 PM Vernona Rieger wrote: CRM for notification. See Telephone encounter for:   03/26/17.  Pt said she has not heard from anyone regarding her having a thyroid ultrasound. She wants to have it at [facility]. Call back [phone]

## 2017-04-01 ENCOUNTER — Ambulatory Visit
Admission: RE | Admit: 2017-04-01 | Discharge: 2017-04-01 | Disposition: A | Discharge status: Home / Self Care | Payer: PPO | Source: Ambulatory Visit | Attending: Internal Medicine | Admitting: Internal Medicine

## 2017-04-01 DIAGNOSIS — E042 Nontoxic multinodular goiter: Secondary | ICD-10-CM | POA: Insufficient documentation

## 2017-04-01 DIAGNOSIS — E041 Nontoxic single thyroid nodule: Secondary | ICD-10-CM | POA: Diagnosis present

## 2017-04-16 ENCOUNTER — Ambulatory Visit (INDEPENDENT_AMBULATORY_CARE_PROVIDER_SITE_OTHER): Payer: PPO | Admitting: Family Medicine

## 2017-04-16 ENCOUNTER — Encounter: Payer: Self-pay | Admitting: Family Medicine

## 2017-04-16 VITALS — BP 132/68 | HR 73 | Temp 98.3°F | Ht 67.0 in | Wt 220.0 lb

## 2017-04-16 DIAGNOSIS — J069 Acute upper respiratory infection, unspecified: Secondary | ICD-10-CM | POA: Diagnosis not present

## 2017-04-16 NOTE — Patient Instructions (Signed)
Please try things such as zyrtec-D or allegra-D which is an antihistamine and decongestant.   Please try afrin which will help with nasal congestion but use for only three days.   Please also try using a netti pot on a regular occasion.  Honey can help with a sore throat.   Please follow up with me later this week if you feel like your symptoms are worse.

## 2017-04-16 NOTE — Progress Notes (Signed)
Kirsten [name] - 62 y.o. female MRN [id number]  Date of birth: 1955-12-21  SUBJECTIVE:  Including CC & ROS.  Chief Complaint  Patient presents with  . Cough    Kirsten [name] is a 62 y.o. female that is presenting with a cough and body aches. Ongoing for three days. Admits to shortness of breath. She has not been around with similar symptoms. She had the flu in January. She has been taking Tylenol. Admits to green mucous.    Review of Systems  Constitutional: Negative for fever.  HENT: Positive for congestion.   Respiratory: Positive for cough.     HISTORY: Past Medical, Surgical, Social, and Family History Reviewed & Updated per EMR.   Pertinent Historical Findings include:  Past Medical History:  Diagnosis Date  . A-fib ([place])   . Anxiety disorder   . Asthma   . Gastroschisis    umphalocele, rotated organs abd repair until age 26  . GERD (gastroesophageal reflux disease)   . HTN (hypertension)   . Hyperlipidemia   . IBS (irritable bowel syndrome)   . Morbid obesity ([place])    Target wt - 185  for BMI < 30  . Obesity   . Pulmonary fibrosis ([place])   . SBO (small bowel obstruction) ([place])    Resolved with NG/Bowel rest around 2009  . Sleep apnea   . Type II or unspecified type diabetes mellitus without mention of complication, not stated as uncontrolled     Past Surgical History:  Procedure Laterality Date  . BREAST EXCISIONAL BIOPSY Left 03/19/2012   neg  . CESAREAN SECTION    . CHOLECYSTECTOMY  1992  . Newborn Surgery - GI - ORGANS OUTSIDE ABDOMEN    . Small Bowel Repair    . TUBAL LIGATION  1988    Allergies  Allergen Reactions  . Factive [Gemifloxacin Mesylate] Rash  . Crestor [Rosuvastatin]     GI upset  . Sulfonamide Derivatives     REACTION: rash  . Gemifloxacin Rash  . Penicillins Hives and Rash    Has patient had a PCN reaction causing immediate rash, facial/tongue/throat swelling, SOB or lightheadedness with hypotension: No Has patient had a PCN  reaction causing severe rash involving mucus membranes or skin necrosis: No Has patient had a PCN reaction that required hospitalization No Has patient had a PCN reaction occurring within the last 10 years: No If all of the above answers are "NO", then may proceed with Cephalosporin use.     Family History  Problem Relation Age of Onset  . Lymphoma Mother   . Diabetes type II Father   . Colon cancer Father   . Diabetes type I Son   . Diabetes Maternal Grandmother   . Goiter Neg Hx   . Breast cancer Neg Hx   . Ovarian cancer Neg Hx      Social History   Socioeconomic History  . Marital status: Married    Spouse name: Not on file  . Number of children: Not on file  . Years of education: Not on file  . Highest education level: Not on file  Social Needs  . Financial resource strain: Not on file  . Food insecurity - worry: Not on file  . Food insecurity - inability: Not on file  . Transportation needs - medical: Not on file  . Transportation needs - non-medical: Not on file  Occupational History  . Occupation: Retired    Comment: Research scientist (physical sciences) at [employer]  Use  . Smoking status: Never Smoker  . Smokeless tobacco: Never Used  . Tobacco comment: "tried as a teen"  Substance and Sexual Activity  . Alcohol use: Yes    Alcohol/week: 1.2 oz    Types: 2 Glasses of wine per week    Comment: rare  . Drug use: No  . Sexual activity: Yes    Birth control/protection: Surgical  Other Topics Concern  . Not on file  Social History Narrative   Regular Exercise -  YES   Daily Caffeine Use:  2   Active and independent at baseline        PHYSICAL EXAM:  VS: BP 132/68 (BP Location: Left Arm, Patient Position: Sitting, Cuff Size: Normal)   Pulse 73   Temp 98.3 F (36.8 C) (Oral)   Ht 5' 7"  (1.702 m)   Wt 220 lb (99.8 kg)   SpO2 95%   BMI 34.46 kg/m  Physical Exam Gen: NAD, alert, cooperative with exam,  ENT: normal lips, normal nasal mucosa, tympanic  membranes clear and intact bilaterally, normal oropharynx, no cervical lymphadenopathy Eye: normal EOM, normal conjunctiva and lids CV:  no edema, +2 pedal pulses, regular rate and rhythm, S1-S2   Resp: no accessory muscle use, non-labored, clear to auscultation bilaterally, no crackles or wheezes Skin: no rashes, no areas of induration  Neuro: normal tone, normal sensation to touch Psych:  normal insight, alert and oriented MSK: Normal gait, normal strength       ASSESSMENT & PLAN:   No problem-specific Assessment & Plan notes found for this encounter.

## 2017-04-16 NOTE — Assessment & Plan Note (Signed)
Symptoms seem to be viral in nature.  - counseled on supportive care  - given indications to follow up.

## 2017-04-23 ENCOUNTER — Emergency Department: Payer: PPO

## 2017-04-23 ENCOUNTER — Inpatient Hospital Stay
Admission: EM | Admit: 2017-04-23 | Discharge: 2017-04-24 | DRG: 189 | Disposition: A | Discharge status: Home / Self Care | Payer: PPO | Attending: Specialist | Admitting: Specialist

## 2017-04-23 ENCOUNTER — Other Ambulatory Visit: Payer: Self-pay

## 2017-04-23 ENCOUNTER — Inpatient Hospital Stay: Payer: PPO

## 2017-04-23 DIAGNOSIS — I4891 Unspecified atrial fibrillation: Secondary | ICD-10-CM | POA: Diagnosis not present

## 2017-04-23 DIAGNOSIS — J441 Chronic obstructive pulmonary disease with (acute) exacerbation: Secondary | ICD-10-CM | POA: Diagnosis not present

## 2017-04-23 DIAGNOSIS — R0602 Shortness of breath: Secondary | ICD-10-CM

## 2017-04-23 DIAGNOSIS — F419 Anxiety disorder, unspecified: Secondary | ICD-10-CM | POA: Diagnosis not present

## 2017-04-23 DIAGNOSIS — Z833 Family history of diabetes mellitus: Secondary | ICD-10-CM

## 2017-04-23 DIAGNOSIS — I10 Essential (primary) hypertension: Secondary | ICD-10-CM | POA: Diagnosis present

## 2017-04-23 DIAGNOSIS — E039 Hypothyroidism, unspecified: Secondary | ICD-10-CM | POA: Diagnosis not present

## 2017-04-23 DIAGNOSIS — E785 Hyperlipidemia, unspecified: Secondary | ICD-10-CM | POA: Diagnosis not present

## 2017-04-23 DIAGNOSIS — E119 Type 2 diabetes mellitus without complications: Secondary | ICD-10-CM | POA: Diagnosis present

## 2017-04-23 DIAGNOSIS — I482 Chronic atrial fibrillation: Secondary | ICD-10-CM | POA: Diagnosis not present

## 2017-04-23 DIAGNOSIS — Z7984 Long term (current) use of oral hypoglycemic drugs: Secondary | ICD-10-CM

## 2017-04-23 DIAGNOSIS — R05 Cough: Secondary | ICD-10-CM | POA: Diagnosis not present

## 2017-04-23 DIAGNOSIS — J8410 Pulmonary fibrosis, unspecified: Secondary | ICD-10-CM | POA: Diagnosis present

## 2017-04-23 DIAGNOSIS — Z800 Family history of malignant neoplasm of digestive organs: Secondary | ICD-10-CM

## 2017-04-23 DIAGNOSIS — J9601 Acute respiratory failure with hypoxia: Secondary | ICD-10-CM | POA: Diagnosis not present

## 2017-04-23 DIAGNOSIS — Z880 Allergy status to penicillin: Secondary | ICD-10-CM

## 2017-04-23 DIAGNOSIS — E118 Type 2 diabetes mellitus with unspecified complications: Secondary | ICD-10-CM

## 2017-04-23 DIAGNOSIS — Z807 Family history of other malignant neoplasms of lymphoid, hematopoietic and related tissues: Secondary | ICD-10-CM

## 2017-04-23 DIAGNOSIS — Z7901 Long term (current) use of anticoagulants: Secondary | ICD-10-CM | POA: Diagnosis not present

## 2017-04-23 DIAGNOSIS — G4733 Obstructive sleep apnea (adult) (pediatric): Secondary | ICD-10-CM | POA: Diagnosis present

## 2017-04-23 DIAGNOSIS — Z794 Long term (current) use of insulin: Secondary | ICD-10-CM | POA: Diagnosis not present

## 2017-04-23 HISTORY — DX: Obstructive sleep apnea (adult) (pediatric): Z99.89

## 2017-04-23 HISTORY — DX: Obstructive sleep apnea (adult) (pediatric): G47.33

## 2017-04-23 HISTORY — DX: Dyspnea, unspecified: R06.00

## 2017-04-23 LAB — BASIC METABOLIC PANEL
ANION GAP: 12 (ref 5–15)
BUN: 15 mg/dL (ref 6–20)
CALCIUM: 9.7 mg/dL (ref 8.9–10.3)
CO2: 29 mmol/L (ref 22–32)
Chloride: 99 mmol/L — ABNORMAL LOW (ref 101–111)
Creatinine, Ser: 0.76 mg/dL (ref 0.44–1.00)
Glucose, Bld: 209 mg/dL — ABNORMAL HIGH (ref 65–99)
Potassium: 4.2 mmol/L (ref 3.5–5.1)
Sodium: 140 mmol/L (ref 135–145)

## 2017-04-23 LAB — GLUCOSE, CAPILLARY
Glucose-Capillary: 246 mg/dL — ABNORMAL HIGH (ref 65–99)
Glucose-Capillary: 282 mg/dL — ABNORMAL HIGH (ref 65–99)
Glucose-Capillary: 247 mg/dL — ABNORMAL HIGH (ref 65–99)

## 2017-04-23 LAB — TROPONIN I

## 2017-04-23 LAB — CBC
HCT: 42.4 % (ref 35.0–47.0)
HEMOGLOBIN: 14.7 g/dL (ref 12.0–16.0)
MCH: 30.7 pg (ref 26.0–34.0)
MCHC: 34.7 g/dL (ref 32.0–36.0)
MCV: 88.4 fL (ref 80.0–100.0)
Platelets: 318 10*3/uL (ref 150–440)
RBC: 4.8 MIL/uL (ref 3.80–5.20)
RDW: 13.7 % (ref 11.5–14.5)
WBC: 10.3 10*3/uL (ref 3.6–11.0)

## 2017-04-23 LAB — INFLUENZA PANEL BY PCR (TYPE A & B)
Influenza A By PCR: NEGATIVE
Influenza B By PCR: NEGATIVE

## 2017-04-23 MED ORDER — AMIODARONE HCL 200 MG PO TABS
200.0000 mg | ORAL_TABLET | Freq: Every day | ORAL | Status: DC
  Administered 2017-04-23 – 2017-04-24 (×2): 200 mg via ORAL
  Filled 2017-04-23 (×2): qty 1

## 2017-04-23 MED ORDER — METOPROLOL SUCCINATE ER 50 MG PO TB24
50.0000 mg | ORAL_TABLET | Freq: Every day | ORAL | Status: DC
  Administered 2017-04-23 – 2017-04-24 (×2): 50 mg via ORAL
  Filled 2017-04-23 (×2): qty 1

## 2017-04-23 MED ORDER — IPRATROPIUM-ALBUTEROL 0.5-2.5 (3) MG/3ML IN SOLN
3.0000 mL | Freq: Once | RESPIRATORY_TRACT | Status: AC
  Administered 2017-04-23: 3 mL via RESPIRATORY_TRACT

## 2017-04-23 MED ORDER — LEVOTHYROXINE SODIUM 100 MCG PO TABS
100.0000 ug | ORAL_TABLET | Freq: Every day | ORAL | Status: DC
  Administered 2017-04-24: 06:00:00 100 ug via ORAL
  Filled 2017-04-23: qty 1

## 2017-04-23 MED ORDER — IPRATROPIUM-ALBUTEROL 0.5-2.5 (3) MG/3ML IN SOLN
3.0000 mL | Freq: Four times a day (QID) | RESPIRATORY_TRACT | Status: DC
  Administered 2017-04-23 – 2017-04-24 (×4): 3 mL via RESPIRATORY_TRACT
  Filled 2017-04-23 (×5): qty 3

## 2017-04-23 MED ORDER — AZITHROMYCIN 500 MG PO TABS: 250.0000 mg | ORAL_TABLET | Freq: Every day | ORAL | Status: DC

## 2017-04-23 MED ORDER — ATORVASTATIN CALCIUM 20 MG PO TABS
40.0000 mg | ORAL_TABLET | Freq: Every day | ORAL | Status: DC
  Administered 2017-04-23 – 2017-04-24 (×2): 40 mg via ORAL
  Filled 2017-04-23 (×2): qty 2

## 2017-04-23 MED ORDER — BUDESONIDE 0.5 MG/2ML IN SUSP
0.5000 mg | Freq: Two times a day (BID) | RESPIRATORY_TRACT | Status: DC
  Administered 2017-04-23 – 2017-04-24 (×2): 0.5 mg via RESPIRATORY_TRACT
  Filled 2017-04-23 (×2): qty 2

## 2017-04-23 MED ORDER — ACETAMINOPHEN 325 MG PO TABS
650.0000 mg | ORAL_TABLET | Freq: Four times a day (QID) | ORAL | Status: DC | PRN
  Administered 2017-04-23 (×3): 650 mg via ORAL
  Filled 2017-04-23 (×3): qty 2

## 2017-04-23 MED ORDER — HYDROCHLOROTHIAZIDE 25 MG PO TABS
25.0000 mg | ORAL_TABLET | Freq: Every day | ORAL | Status: DC
  Administered 2017-04-23 – 2017-04-24 (×2): 25 mg via ORAL
  Filled 2017-04-23 (×2): qty 1

## 2017-04-23 MED ORDER — IPRATROPIUM-ALBUTEROL 0.5-2.5 (3) MG/3ML IN SOLN
RESPIRATORY_TRACT | Status: AC
  Administered 2017-04-23: 3 mL via RESPIRATORY_TRACT
  Filled 2017-04-23: qty 6

## 2017-04-23 MED ORDER — AZITHROMYCIN 500 MG PO TABS
500.0000 mg | ORAL_TABLET | Freq: Every day | ORAL | Status: AC
  Administered 2017-04-23: 15:00:00 500 mg via ORAL
  Filled 2017-04-23: qty 1

## 2017-04-23 MED ORDER — INSULIN ASPART 100 UNIT/ML ~~LOC~~ SOLN
0.0000 [IU] | Freq: Every day | SUBCUTANEOUS | Status: DC
  Administered 2017-04-23: 2 [IU] via SUBCUTANEOUS
  Filled 2017-04-23: qty 1

## 2017-04-23 MED ORDER — PAROXETINE HCL ER 12.5 MG PO TB24
25.0000 mg | ORAL_TABLET | Freq: Every day | ORAL | Status: DC
  Administered 2017-04-23 – 2017-04-24 (×2): 25 mg via ORAL
  Filled 2017-04-23 (×2): qty 1
  Filled 2017-04-23: qty 2

## 2017-04-23 MED ORDER — IPRATROPIUM-ALBUTEROL 0.5-2.5 (3) MG/3ML IN SOLN
3.0000 mL | Freq: Once | RESPIRATORY_TRACT | Status: AC
Start: 2017-04-23 — End: 2017-04-23
  Administered 2017-04-23: 3 mL via RESPIRATORY_TRACT

## 2017-04-23 MED ORDER — INSULIN ASPART 100 UNIT/ML ~~LOC~~ SOLN
0.0000 [IU] | Freq: Three times a day (TID) | SUBCUTANEOUS | Status: DC
  Administered 2017-04-23: 13:00:00 3 [IU] via SUBCUTANEOUS
  Administered 2017-04-23 – 2017-04-24 (×2): 5 [IU] via SUBCUTANEOUS
  Filled 2017-04-23 (×3): qty 1

## 2017-04-23 MED ORDER — ALBUTEROL SULFATE (2.5 MG/3ML) 0.083% IN NEBU
2.5000 mg | INHALATION_SOLUTION | Freq: Once | RESPIRATORY_TRACT | Status: AC
  Administered 2017-04-23: 2.5 mg via RESPIRATORY_TRACT
  Filled 2017-04-23: qty 3

## 2017-04-23 MED ORDER — IPRATROPIUM-ALBUTEROL 0.5-2.5 (3) MG/3ML IN SOLN
RESPIRATORY_TRACT | Status: AC
  Administered 2017-04-23: 3 mL via RESPIRATORY_TRACT
  Filled 2017-04-23: qty 3

## 2017-04-23 MED ORDER — DOCUSATE SODIUM 100 MG PO CAPS
100.0000 mg | ORAL_CAPSULE | Freq: Two times a day (BID) | ORAL | Status: DC
  Administered 2017-04-24: 100 mg via ORAL
  Filled 2017-04-23: qty 1

## 2017-04-23 MED ORDER — ONDANSETRON HCL 4 MG/2ML IJ SOLN: 4.0000 mg | Freq: Four times a day (QID) | INTRAMUSCULAR | Status: DC | PRN

## 2017-04-23 MED ORDER — METHYLPREDNISOLONE SODIUM SUCC 125 MG IJ SOLR
125.0000 mg | Freq: Once | INTRAMUSCULAR | Status: AC
  Administered 2017-04-23: 125 mg via INTRAVENOUS
  Filled 2017-04-23: qty 2

## 2017-04-23 MED ORDER — APIXABAN 5 MG PO TABS
5.0000 mg | ORAL_TABLET | Freq: Two times a day (BID) | ORAL | Status: DC
  Administered 2017-04-23 – 2017-04-24 (×2): 5 mg via ORAL
  Filled 2017-04-23 (×2): qty 1

## 2017-04-23 MED ORDER — ONDANSETRON HCL 4 MG PO TABS: 4.0000 mg | ORAL_TABLET | Freq: Four times a day (QID) | ORAL | Status: DC | PRN

## 2017-04-23 MED ORDER — METHYLPREDNISOLONE SODIUM SUCC 40 MG IJ SOLR
40.0000 mg | Freq: Two times a day (BID) | INTRAMUSCULAR | Status: DC
  Administered 2017-04-23 – 2017-04-24 (×3): 40 mg via INTRAVENOUS
  Filled 2017-04-23 (×3): qty 1

## 2017-04-23 MED ORDER — ACETAMINOPHEN 650 MG RE SUPP: 650.0000 mg | Freq: Four times a day (QID) | RECTAL | Status: DC | PRN

## 2017-04-23 MED ORDER — LORATADINE 10 MG PO TABS
10.0000 mg | ORAL_TABLET | Freq: Every day | ORAL | Status: DC
  Administered 2017-04-23 – 2017-04-24 (×2): 10 mg via ORAL
  Filled 2017-04-23 (×2): qty 1

## 2017-04-23 MED ORDER — ENOXAPARIN SODIUM 40 MG/0.4ML ~~LOC~~ SOLN
40.0000 mg | SUBCUTANEOUS | Status: DC
  Administered 2017-04-23: 10:00:00 40 mg via SUBCUTANEOUS
  Filled 2017-04-23: qty 0.4

## 2017-04-23 NOTE — ED Provider Notes (Signed)
[facility] Emergency Department Provider Note   First MD Initiated Contact with Patient 04/23/17 0510     (approximate)  I have reviewed the triage vital signs and the nursing notes.   HISTORY  Chief Complaint Shortness of Breath and Cough    HPI Kirsten [name] is a 62 y.o. female with below list of chronic medical conditions including atrial fibrillation sleep apnea, chronic lung disease hypertension and diabetes presents to the emergency department with worsening dyspnea over the past 5 days.  Patient states that she was seen by her primary care provider however she was not prescribed any medication.  Arrival patient clearly has increased work of breathing with hypoxia oxygen saturation 85%.  Patient denies any chest pain.  Patient denies any lower extremity pain or swelling.  Past Medical History:  Diagnosis Date  . A-fib ([place])   . Anxiety disorder   . Asthma   . Dyspnea   . Gastroschisis    umphalocele, rotated organs abd repair until age 76  . GERD (gastroesophageal reflux disease)   . HTN (hypertension)   . Hyperlipidemia   . IBS (irritable bowel syndrome)   . Morbid obesity ([place])    Target wt - 185  for BMI < 30  . Obesity   . OSA on CPAP   . Pulmonary fibrosis ([place])   . SBO (small bowel obstruction) ([place])    Resolved with NG/Bowel rest around 2009  . Sleep apnea   . Type II or unspecified type diabetes mellitus without mention of complication, not stated as uncontrolled     Patient Active Problem List   Diagnosis Date Noted  . Pulmonary fibrosis ([place])   . Acute respiratory failure with hypoxia ([place]) 04/23/2017  . Upper respiratory tract infection 04/16/2017  . 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 Insomnia w/ sleep apnea 03/21/2017  . Calcific bursitis 11/23/2016  . COPD exacerbation ([place]) 04/03/2016  . Class 1 obesity with serious comorbidity and body mass index (BMI) of 34.0 to 34.9 in adult 01/31/2016  . Vaginal  atrophy 01/13/2015  . Vaginal stenosis 01/13/2015  . Insomnia due to anxiety and fear 10/11/2014  . GAD (generalized anxiety disorder) 10/11/2014  . Paroxysmal atrial fibrillation ([place]) 04/08/2014  . Hyperlipidemia with target LDL less than 100 10/22/2012  . Asthma, mild persistent 04/06/2011  . Hypertriglyceridemia 01/29/2011  . Vitamin B 12 deficiency 06/28/2009  . Hypothyroidism 06/20/2009  . Depression with anxiety 06/20/2009  . Type II diabetes mellitus with manifestations ([place]) 04/08/2009  . Essential hypertension, benign 04/08/2009  . OSA (obstructive sleep apnea) 04/08/2009    Past Surgical History:  Procedure Laterality Date  . BREAST EXCISIONAL BIOPSY Left 03/19/2012   neg  . CESAREAN SECTION    . CHOLECYSTECTOMY  1992  . Newborn Surgery - GI - ORGANS OUTSIDE ABDOMEN    . Small Bowel Repair    . TUBAL LIGATION  1988    Prior to Admission medications   Medication Sig Start Date End Date Taking? Authorizing Provider  amiodarone (PACERONE) 200 MG tablet Take 200 mg daily by mouth.   Yes [provider]  apixaban (ELIQUIS) 5 MG TABS tablet Take 1 tablet (5 mg total) by mouth 2 (two) times daily. 04/10/16  Yes [name], MD  atorvastatin (LIPITOR) 40 MG tablet Take 1 tablet (40 mg total) by mouth daily. 03/21/17  Yes [name], MD  glucose blood (ONE TOUCH ULTRA TEST) test strip Use as instructed  06/03/13  Yes Biagio Borg, MD  hydrochlorothiazide (HYDRODIURIL) 25 MG tablet Take 1 tablet (25 mg total) by mouth daily. 03/21/17  Yes [name], MD  metFORMIN (GLUCOPHAGE) 1000 MG tablet take 1 tablet by mouth twice a day with A MEAL Patient taking differently: Take 1,000 mg by mouth 2 (two) times daily with a meal.  03/21/17  Yes [name], MD  metoprolol succinate (TOPROL-XL) 50 MG 24 hr tablet Take 50 mg by mouth daily.  12/12/15  Yes [provider]  PARoxetine (PAXIL-CR) 25 MG 24 hr tablet Take 1 tablet (25 mg total) by mouth daily. 03/18/17   Yes [name], MD  azithromycin (ZITHROMAX) 250 MG tablet Take 1 tablet (250 mg total) by mouth daily for 4 days. 04/24/17 04/28/17  Henreitta Leber, MD  levothyroxine (SYNTHROID, LEVOTHROID) 100 MCG tablet TAKE 1 TABLET BY MOUTH ONCE DAILY 04/24/17   [name], MD  predniSONE (DELTASONE) 10 MG tablet Label  & dispense according to the schedule below. 5 Pills PO for 1 day then, 4 Pills PO for 1 day, 3 Pills PO for 1 day, 2 Pills PO for 1 day, 1 Pill PO for 1 days then STOP. 04/24/17   Henreitta Leber, MD  zolpidem (AMBIEN) 5 MG tablet Take 1 tablet (5 mg total) by mouth at bedtime as needed for sleep. 04/24/17   Henreitta Leber, MD    Allergies Factive [gemifloxacin mesylate]; Crestor [rosuvastatin]; Sulfonamide derivatives; Gemifloxacin; and Penicillins  Family History  Problem Relation Age of Onset  . Lymphoma Mother   . Diabetes type II Father   . Colon cancer Father   . Diabetes type I Son   . Diabetes Maternal Grandmother   . Goiter Neg Hx   . Breast cancer Neg Hx   . Ovarian cancer Neg Hx     Social History Social History   Tobacco Use  . Smoking status: Never Smoker  . Smokeless tobacco: Never Used  . Tobacco comment: "tried as a teen"  Substance Use Topics  . Alcohol use: Yes    Alcohol/week: 1.2 oz    Types: 2 Glasses of wine per week    Comment: rare  . Drug use: No    Review of Systems Constitutional: No fever/chills Eyes: No visual changes. ENT: No sore throat. Cardiovascular: Denies chest pain. Respiratory: Positive for shortness of breath. Gastrointestinal: No abdominal pain.  No nausea, no vomiting.  No diarrhea.  No constipation. Genitourinary: Negative for dysuria. Musculoskeletal: Negative for neck pain.  Negative for back pain. Integumentary: Negative for rash. Neurological: Negative for headaches, focal weakness or numbness.   ____________________________________________   PHYSICAL EXAM:  VITAL SIGNS: ED Triage Vitals  Enc Vitals  Group     BP 04/23/17 0503 (!) 162/65     Pulse Rate 04/23/17 0503 79     Resp 04/23/17 0503 20     Temp 04/23/17 0503 99.5 F (37.5 C)     Temp Source 04/23/17 0503 Oral     SpO2 04/23/17 0503 (!) 88 %     Weight 04/23/17 0504 90.7 kg (200 lb)     Height 04/23/17 0504 1.702 m (5' 7" )     Head Circumference --      Peak Flow --      Pain Score --      Pain Loc --      Pain Edu? --      Excl. in [place]? --  Constitutional: Alert and oriented.  Apparent respiratory distress  eyes: Conjunctivae are normal.  Head: Atraumatic. Ears:  Healthy appearing ear canals and TMs bilaterally Nose: No congestion/rhinnorhea. Mouth/Throat: Mucous membranes are moist. Oropharynx non-erythematous. Neck: No stridor.   Cardiovascular: Normal rate, regular rhythm. Good peripheral circulation. Grossly normal heart sounds. Respiratory: Apparent respiratory distress, tachypnea, positive accessory restroom muscle use, diffuse rhonchi  gastrointestinal: Soft and nontender. No distention.  Musculoskeletal: No lower extremity tenderness nor edema. No gross deformities of extremities. Neurologic:  Normal speech and language. No gross focal neurologic deficits are appreciated.  Skin:  Skin is warm, dry and intact. No rash noted. Psychiatric: Mood and affect are normal. Speech and behavior are normal.  ____________________________________________   LABS (all labs ordered are listed, but only abnormal results are displayed)  Labs Reviewed  BASIC METABOLIC PANEL - Abnormal; Notable for the following components:      Result Value   Chloride 99 (*)    Glucose, Bld 209 (*)    All other components within normal limits  GLUCOSE, CAPILLARY - Abnormal; Notable for the following components:   Glucose-Capillary 246 (*)    All other components within normal limits  GLUCOSE, CAPILLARY - Abnormal; Notable for the following components:   Glucose-Capillary 282 (*)    All other components within normal limits    GLUCOSE, CAPILLARY - Abnormal; Notable for the following components:   Glucose-Capillary 247 (*)    All other components within normal limits  GLUCOSE, CAPILLARY - Abnormal; Notable for the following components:   Glucose-Capillary 295 (*)    All other components within normal limits  CBC  TROPONIN I  INFLUENZA PANEL BY PCR (TYPE A & B)   ____________________________________________  EKG ED ECG REPORT I, Alexander N Makhiya Coburn, the attending physician, personally viewed and interpreted this ECG.   Date: 04/24/2017  EKG Time: 5:10 AM  Rate: 78  Rhythm: Normal sinus rhythm  Axis: Normal  Intervals: Normal  ST&T Change: None   .Critical Care Performed by: Gregor Hams, MD Authorized by: Gregor Hams, MD   Critical care provider statement:    Critical care time (minutes):  30   Critical care start time:  04/23/2017 5:10 AM   Critical care end time:  04/23/2017 5:40 AM   Critical care was necessary to treat or prevent imminent or life-threatening deterioration of the following conditions:  Respiratory failure   Critical care was time spent personally by me on the following activities:  Development of treatment plan with patient or surrogate, discussions with consultants, discussions with primary provider, examination of patient, evaluation of patient's response to treatment, obtaining history from patient or surrogate, ordering and performing treatments and interventions, ordering and review of laboratory studies, ordering and review of radiographic studies, pulse oximetry, re-evaluation of patient's condition and review of old charts   I assumed direction of critical care for this patient from another provider in my specialty: no        ____________________________________________   INITIAL IMPRESSION / ASSESSMENT AND PLAN / ED COURSE  As part of my medical decision making, I reviewed the following data within the electronic MEDICAL RECORD NUMBER   62 year old female  presented with above-stated history and physical exam secondary to progressive respiratory distress.  Patient received 2 DuoNeb's 125 mg of IV Solu-Medrol with relief however symptoms not completely resolved.  Patient still requires supplemental oxygen at this time.  As such patient received an additional albuterol breathing treatment with further improvement.  As such patient  discussed with Dr. Marcille Blanco hospital admission for further evaluation and management of COPD exacerbation  ___________________________________  FINAL CLINICAL IMPRESSION(S) / ED DIAGNOSES  Final diagnoses:  SOB (shortness of breath)  Type 2 diabetes mellitus with complication, without long-term current use of insulin ([place])     MEDICATIONS GIVEN DURING THIS VISIT:  Medications  ipratropium-albuterol (DUONEB) 0.5-2.5 (3) MG/3ML nebulizer solution 3 mL (3 mLs Nebulization Given 04/23/17 0518)  ipratropium-albuterol (DUONEB) 0.5-2.5 (3) MG/3ML nebulizer solution 3 mL (3 mLs Nebulization Given 04/23/17 0518)  methylPREDNISolone sodium succinate (SOLU-MEDROL) 125 mg/2 mL injection 125 mg (125 mg Intravenous Given 04/23/17 0521)  albuterol (PROVENTIL) (2.5 MG/3ML) 0.083% nebulizer solution 2.5 mg (2.5 mg Nebulization Given 04/23/17 0626)  azithromycin (ZITHROMAX) tablet 500 mg (500 mg Oral Given 04/23/17 1439)     ED Discharge Orders        Ordered    azithromycin (ZITHROMAX) 250 MG tablet  Daily     04/24/17 0909    predniSONE (DELTASONE) 10 MG tablet     04/24/17 0909    zolpidem (AMBIEN) 5 MG tablet  At bedtime PRN     04/24/17 0909    Activity as tolerated - No restrictions     04/24/17 0909    Diet - low sodium heart healthy     04/24/17 0909    Diet Carb Modified     04/24/17 7673       Note:  This document was prepared using Dragon voice recognition software and may include unintentional dictation errors.    Gregor Hams, MD 04/24/17 2252

## 2017-04-23 NOTE — Progress Notes (Signed)
[place] [facility] [place] NAME: Markela Wee    MR#:  [id number]  DATE OF BIRTH:  1955-05-11  SUBJECTIVE:   Patient here due to shortness of breath and respiratory distress and noted to have acute respiratory failure with hypoxia. Patient still complaining of a cough and exertional shortness of breath.  REVIEW OF SYSTEMS:    Review of Systems  Constitutional: Negative for chills and fever.  HENT: Negative for congestion and tinnitus.   Eyes: Negative for blurred vision and double vision.  Respiratory: Positive for cough, sputum production and shortness of breath. Negative for wheezing.   Cardiovascular: Negative for chest pain, orthopnea and PND.  Gastrointestinal: Negative for abdominal pain, diarrhea, nausea and vomiting.  Genitourinary: Negative for dysuria and hematuria.  Neurological: Negative for dizziness, sensory change and focal weakness.  All other systems reviewed and are negative.   Nutrition: Heart Healthy Tolerating Diet: yes Tolerating PT: Await Eval.   DRUG ALLERGIES:   Allergies  Allergen Reactions  . Factive [Gemifloxacin Mesylate] Rash  . Crestor [Rosuvastatin]     GI upset  . Sulfonamide Derivatives     REACTION: rash  . Gemifloxacin Rash  . Penicillins Hives and Rash    Has patient had a PCN reaction causing immediate rash, facial/tongue/throat swelling, SOB or lightheadedness with hypotension: No Has patient had a PCN reaction causing severe rash involving mucus membranes or skin necrosis: No Has patient had a PCN reaction that required hospitalization No Has patient had a PCN reaction occurring within the last 10 years: No If all of the above answers are "NO", then may proceed with Cephalosporin use.     VITALS:  Blood pressure 120/66, pulse 69, temperature 98.9 F (37.2 C), temperature source Oral, resp. rate 20, height 5' 7"  (1.702 m), weight 90.7 kg (200 lb), SpO2 96 %.  PHYSICAL EXAMINATION:    Physical Exam  GENERAL:  62 y.o.-year-old patient lying in bed in no acute distress.  EYES: Pupils equal, round, reactive to light and accommodation. No scleral icterus. Extraocular muscles intact.  HEENT: Head atraumatic, normocephalic. Oropharynx and nasopharynx clear.  NECK:  Supple, no jugular venous distention. No thyroid enlargement, no tenderness.  LUNGS:  Prolonged inspiratory and expiratory phase, no wheezing, rales, rhonchi. No use of accessory muscles of respiration.  CARDIOVASCULAR: S1, S2 normal. No murmurs, rubs, or gallops.  ABDOMEN: Soft, nontender, nondistended. Bowel sounds present. No organomegaly or mass.  EXTREMITIES: No cyanosis, clubbing or edema b/l.    NEUROLOGIC: Cranial nerves II through XII are intact. No focal Motor or sensory deficits b/l.   PSYCHIATRIC: The patient is alert and oriented x 3.  SKIN: No obvious rash, lesion, or ulcer.    LABORATORY PANEL:   CBC Recent Labs  Lab 04/23/17 0518  WBC 10.3  HGB 14.7  HCT 42.4  PLT 318   ------------------------------------------------------------------------------------------------------------------  Chemistries  Recent Labs  Lab 04/23/17 0518  NA 140  K 4.2  CL 99*  CO2 29  GLUCOSE 209*  BUN 15  CREATININE 0.76  CALCIUM 9.7   ------------------------------------------------------------------------------------------------------------------  Cardiac Enzymes Recent Labs  Lab 04/23/17 0518  TROPONINI <0.03   ------------------------------------------------------------------------------------------------------------------  RADIOLOGY:  Dg Chest Port 1 View  Result Date: 04/23/2017 CLINICAL DATA:  62 y/o  F; cough and shortness of breath. EXAM: PORTABLE CHEST 1 VIEW COMPARISON:  03/29/2016 chest radiograph.  04/01/2016 CT chest. FINDINGS: Stable cardiac silhouette given projection and technique. Reticular opacities greatest in lung bases probably representing bronchitic changes.  No  consolidation, effusion, or pneumothorax. Bones are unremarkable. IMPRESSION: Reticular opacities in lung bases probably representing bronchitic changes. No focal consolidation identified. Electronically Signed   By: Kristine Garbe M.D.   On: 04/23/2017 05:53     ASSESSMENT AND PLAN:   62 year old female with past medical history of atrial fibrillation, hypertension,, diabetes,hypothyroidism who presents to the hospital due to shortness of breath and noted to be in acute respiratory failure with hypoxia.  1.acute respiratory failure with hypoxia-suspected to be secondary to bronchitis. -Chest x-ray on admission showing some bronchitic changes at the bases. Patient has no previous history of asthma or COPD or tobacco abuse. -Patient had some wheezing and bronchospasm on admission. We'll treat the patient with IV steroids, scheduled DuoNeb's, Pulmicort nebs. also place the patient on empiric Zithromax. - I will get a CT chest to further evaluate her lung parenchyma.  2.chronic atrial fibrillation-rate controlled. Continue amiodarone, metoprolol. Continue Eliquis.  3.diabetes type 2 without complication-continue sliding scale insulin. Follow blood sugars.  4. Essential hypertension-continue metoprolol, hydrochlorothiazide.  5. Anxiety - cont. paxil  6. Hypothyroidism - cont. Synthroid.    All the records are reviewed and case discussed with Care Management/Social Worker. Management plans discussed with the patient, family and they are in agreement.  CODE STATUS: full code  DVT Prophylaxis: Eliquis  TOTAL TIME TAKING CARE OF THIS PATIENT: 30 minutes.   POSSIBLE D/C IN 1-2 DAYS, DEPENDING ON CLINICAL CONDITION.   Henreitta Leber M.D on 04/23/2017 at 1:52 PM  Between 7am to 6pm - Pager - [phone]  After 6pm go to [email or web address] - password [email or web address] [facility]  Office  [phone]  CC: Primary care physician; [name], MD

## 2017-04-23 NOTE — Progress Notes (Signed)
Inpatient Diabetes Program Recommendations  AACE/ADA: New Consensus Statement on Inpatient Glycemic Control (2015)  Target Ranges:  Prepandial:   less than 140 mg/dL      Peak postprandial:   less than 180 mg/dL (1-2 hours)      Critically ill patients:  140 - 180 mg/dL   Results for LASHYA, PASSE (MRN [id number]) as of 04/23/2017 10:20  Ref. Range 01/16/2017 10:55 04/23/2017 05:18  Glucose Latest Ref Range: 65 - 99 mg/dL 124 (H) 209 (H)  Hemoglobin A1C Latest Ref Range: 4.6 - 6.5 % 6.9 (H)    Review of Glycemic Control  Diabetes history: DM2 Outpatient Diabetes medications: Metformin 1000 mg BID Current orders for Inpatient glycemic control: None  Inpatient Diabetes Program Recommendations: Correction (SSI): While inpatient, please consider ordering CBGs with Novolog 0-9 units TID with meals and Novolog 0-5 units QHS. Diet: Please discontinue Regular diet and order Carb Modified diet.  Thanks, Barnie Alderman, RN, MSN, CDE Diabetes Coordinator Inpatient Diabetes Program [phone] (Team Pager from 8am to 5pm)

## 2017-04-23 NOTE — Plan of Care (Signed)
  Progressing Education: Knowledge of General Education information will improve 04/23/2017 1828 - Progressing by Daylene Posey, RN Health Behavior/Discharge Planning: Ability to manage health-related needs will improve 04/23/2017 1828 - Progressing by Daylene Posey, RN Clinical Measurements: Ability to maintain clinical measurements within normal limits will improve 04/23/2017 1828 - Progressing by Daylene Posey, RN Will remain free from infection 04/23/2017 1828 - Progressing by Daylene Posey, RN Diagnostic test results will improve 04/23/2017 1828 - Progressing by Daylene Posey, RN Respiratory complications will improve 04/23/2017 1828 - Progressing by Daylene Posey, RN Cardiovascular complication will be avoided 04/23/2017 1828 - Progressing by Daylene Posey, RN Activity: Risk for activity intolerance will decrease 04/23/2017 1828 - Progressing by Daylene Posey, RN Nutrition: Adequate nutrition will be maintained 04/23/2017 1828 - Progressing by Daylene Posey, RN Coping: Level of anxiety will decrease 04/23/2017 1828 - Progressing by Daylene Posey, RN Elimination: Will not experience complications related to bowel motility 04/23/2017 1828 - Progressing by Daylene Posey, RN Will not experience complications related to urinary retention 04/23/2017 1828 - Progressing by Daylene Posey, RN Pain Managment: General experience of comfort will improve 04/23/2017 1828 - Progressing by Daylene Posey, RN Safety: Ability to remain free from injury will improve 04/23/2017 1828 - Progressing by Daylene Posey, RN Skin Integrity: Risk for impaired skin integrity will decrease 04/23/2017 1828 - Progressing by Daylene Posey, RN

## 2017-04-23 NOTE — ED Triage Notes (Addendum)
Pt arrives to ED via POV from home with c/o cough and SHOB x1 week. Pt reports being seen by her PCP within the last week and told "everything was normal". Pt reports productive cough with "green" mucus; pt denies c/o N/V/D, no fever. Pt reports h/x of COPD and "chronic bronchitis"; reports home O2 was 79%. Pt's O2 sats in Triage are 88% on RA.

## 2017-04-23 NOTE — H&P (Addendum)
Kirsten [name] is an 62 y.o. female.   Chief Complaint: Shortness of breath HPI: The patient with past medical history of atrial fibrillation, sleep apnea, chronic lung disease, diabetes and hypertension presents to the emergency department complaining of shortness of breath.  The patient states that her dyspnea has worsened dramatically today although she has been feeling short of breath for 5 days.  She saw her doctor at that time but was not prescribed antibiotics or new inhalers.  She has been coughing up scant amounts of thick green sputum.  Currently she states that her inhalers have not been helping.  In the emergency department she had significantly increased work of breathing as well as hypoxia to 85% (lower at home per husband).  She received Solu-Medrol as well as multiple breathing treatments but continued to have persistent wheezing and subjective shortness of breath which prompted the emergency department staff to call the hospitalist service for admission.  Past Medical History:  Diagnosis Date  . A-fib ([place])   . Anxiety disorder   . Asthma   . Gastroschisis    umphalocele, rotated organs abd repair until age 37  . GERD (gastroesophageal reflux disease)   . HTN (hypertension)   . Hyperlipidemia   . IBS (irritable bowel syndrome)   . Morbid obesity ([place])    Target wt - 185  for BMI < 30  . Obesity   . Pulmonary fibrosis ([place])   . SBO (small bowel obstruction) ([place])    Resolved with NG/Bowel rest around 2009  . Sleep apnea   . Type II or unspecified type diabetes mellitus without mention of complication, not stated as uncontrolled     Past Surgical History:  Procedure Laterality Date  . BREAST EXCISIONAL BIOPSY Left 03/19/2012   neg  . CESAREAN SECTION    . CHOLECYSTECTOMY  1992  . Newborn Surgery - GI - ORGANS OUTSIDE ABDOMEN    . Small Bowel Repair    . TUBAL LIGATION  1988    Family History  Problem Relation Age of Onset  . Lymphoma Mother   . Diabetes type II  Father   . Colon cancer Father   . Diabetes type I Son   . Diabetes Maternal Grandmother   . Goiter Neg Hx   . Breast cancer Neg Hx   . Ovarian cancer Neg Hx    Social History:  reports that  has never smoked. she has never used smokeless tobacco. She reports that she drinks about 1.2 oz of alcohol per week. She reports that she does not use drugs.  Allergies:  Allergies  Allergen Reactions  . Factive [Gemifloxacin Mesylate] Rash  . Crestor [Rosuvastatin]     GI upset  . Sulfonamide Derivatives     REACTION: rash  . Gemifloxacin Rash  . Penicillins Hives and Rash    Has patient had a PCN reaction causing immediate rash, facial/tongue/throat swelling, SOB or lightheadedness with hypotension: No Has patient had a PCN reaction causing severe rash involving mucus membranes or skin necrosis: No Has patient had a PCN reaction that required hospitalization No Has patient had a PCN reaction occurring within the last 10 years: No If all of the above answers are "NO", then may proceed with Cephalosporin use.     Medications Prior to Admission  Medication Sig Dispense Refill  . amiodarone (PACERONE) 200 MG tablet Take 200 mg daily by mouth.    Marland Kitchen apixaban (ELIQUIS) 5 MG TABS tablet Take 1 tablet (5 mg total) by  mouth 2 (two) times daily. 180 tablet 1  . atorvastatin (LIPITOR) 40 MG tablet Take 1 tablet (40 mg total) by mouth daily. 90 tablet 1  . glucose blood (ONE TOUCH ULTRA TEST) test strip Use as instructed 100 each 12  . hydrochlorothiazide (HYDRODIURIL) 25 MG tablet Take 1 tablet (25 mg total) by mouth daily. 90 tablet 1  . levothyroxine (SYNTHROID, LEVOTHROID) 100 MCG tablet Take 1 tablet (100 mcg total) by mouth daily. 90 tablet 0  . metFORMIN (GLUCOPHAGE) 1000 MG tablet take 1 tablet by mouth twice a day with A MEAL (Patient taking differently: Take 1,000 mg by mouth 2 (two) times daily with a meal. ) 180 tablet 0  . metoprolol succinate (TOPROL-XL) 50 MG 24 hr tablet Take 50 mg by  mouth daily.     Marland Kitchen PARoxetine (PAXIL-CR) 25 MG 24 hr tablet Take 1 tablet (25 mg total) by mouth daily. 90 tablet 1    Results for orders placed or performed during the hospital encounter of 04/23/17 (from the past 48 hour(s))  Basic metabolic panel     Status: Abnormal   Collection Time: 04/23/17  5:18 AM  Result Value Ref Range   Sodium 140 135 - 145 mmol/L   Potassium 4.2 3.5 - 5.1 mmol/L   Chloride 99 (L) 101 - 111 mmol/L   CO2 29 22 - 32 mmol/L   Glucose, Bld 209 (H) 65 - 99 mg/dL   BUN 15 6 - 20 mg/dL   Creatinine, Ser 0.76 0.44 - 1.00 mg/dL   Calcium 9.7 8.9 - 10.3 mg/dL   GFR calc non Af Amer >60 >60 mL/min   GFR calc Af Amer >60 >60 mL/min    Comment: (NOTE) The eGFR has been calculated using the CKD EPI equation. This calculation has not been validated in all clinical situations. eGFR's persistently <60 mL/min signify possible Chronic Kidney Disease.    Anion gap 12 5 - 15    Comment: Performed at [facility], [address] [place], [place] [address]  CBC     Status: None   Collection Time: 04/23/17  5:18 AM  Result Value Ref Range   WBC 10.3 3.6 - 11.0 K/uL   RBC 4.80 3.80 - 5.20 MIL/uL   Hemoglobin 14.7 12.0 - 16.0 g/dL   HCT 42.4 35.0 - 47.0 %   MCV 88.4 80.0 - 100.0 fL   MCH 30.7 26.0 - 34.0 pg   MCHC 34.7 32.0 - 36.0 g/dL   RDW 13.7 11.5 - 14.5 %   Platelets 318 150 - 440 K/uL    Comment: Performed at [facility], [address] [place], [place] [address]  Troponin I     Status: None   Collection Time: 04/23/17  5:18 AM  Result Value Ref Range   Troponin I <0.03 <0.03 ng/mL    Comment: Performed at [facility], [address], [place], [place] [address]   Dg Chest Port 1 View  Result Date: 04/23/2017 CLINICAL DATA:  62 y/o  F; cough and shortness of breath. EXAM: PORTABLE CHEST 1 VIEW COMPARISON:  03/29/2016 chest radiograph.  04/01/2016 CT chest. FINDINGS: Stable cardiac silhouette given projection and technique.  Reticular opacities greatest in lung bases probably representing bronchitic changes. No consolidation, effusion, or pneumothorax. Bones are unremarkable. IMPRESSION: Reticular opacities in lung bases probably representing bronchitic changes. No focal consolidation identified. Electronically Signed   By: Kristine Garbe M.D.   On: 04/23/2017 05:53    Review of  Systems  Constitutional: Negative for chills and fever.  HENT: Negative for sore throat and tinnitus.   Eyes: Negative for blurred vision and redness.  Respiratory: Positive for cough, sputum production, shortness of breath and wheezing.   Cardiovascular: Negative for chest pain, palpitations, orthopnea and PND.  Gastrointestinal: Negative for abdominal pain, diarrhea, nausea and vomiting.  Genitourinary: Negative for dysuria, frequency and urgency.  Musculoskeletal: Negative for joint pain and myalgias.  Skin: Negative for rash.       No lesions  Neurological: Negative for speech change, focal weakness and weakness.  Endo/Heme/Allergies: Does not bruise/bleed easily.       No temperature intolerance  Psychiatric/Behavioral: Negative for depression and suicidal ideas.    Blood pressure (!) 118/54, pulse 72, temperature 99 F (37.2 C), temperature source Oral, resp. rate 20, height 5' 7"  (1.702 m), weight 90.7 kg (200 lb), SpO2 94 %. Physical Exam  Vitals reviewed. Constitutional: She is oriented to person, place, and time. She appears well-developed and well-nourished. No distress.  HENT:  Head: Normocephalic and atraumatic.  Mouth/Throat: Oropharynx is clear and moist.  Eyes: Conjunctivae and EOM are normal. Pupils are equal, round, and reactive to light. No scleral icterus.  Neck: Normal range of motion. Neck supple. No JVD present. No tracheal deviation present. No thyromegaly present.  Cardiovascular: Normal rate, regular rhythm and normal heart sounds. Exam reveals no gallop and no friction rub.  No murmur  heard. Respiratory: She is in respiratory distress. She has wheezes (Musical; throughout lungs bilaterally.  Good air movement).  GI: Soft. Bowel sounds are normal. She exhibits no distension. There is no tenderness.  Genitourinary:  Genitourinary Comments: Deferred  Musculoskeletal: Normal range of motion. She exhibits no edema.  Lymphadenopathy:    She has no cervical adenopathy.  Neurological: She is alert and oriented to person, place, and time. No cranial nerve deficit. She exhibits normal muscle tone.  Skin: Skin is warm and dry. No rash noted. No erythema.  Psychiatric: She has a normal mood and affect. Her behavior is normal. Judgment and thought content normal.     Assessment/Plan This is a 62 year old female admitted for respiratory distress with hypoxia. 1.  Respiratory distress: Acute; with hypoxia.  Likely exacerbation of COPD although the patient may have some other form of chronic lung disease as pulmonary fibrosis is on her medical history list.  Continue steroid taper.  Azithromycin for anti-inflammatory effect. 2.  Atrial fibrillation: Paroxysmal; continue amiodarone and Eliquis 3.  Diabetes mellitus type 2: Hold oral hypoglycemic agents.  Sliding scale insulin while hospitalized. 4.  Hyper lipidemia: Continue statin therapy 5.  Hypothyroidism: Continue Synthroid 6.  DVT prophylaxis: Lovenox 7.  GI prophylaxis: None The patient is a full code.  Time spent on admission orders and patient care approximately 45 minutes  Harrie Foreman, MD 04/23/2017, 8:03 AM

## 2017-04-24 ENCOUNTER — Other Ambulatory Visit: Payer: Self-pay | Admitting: Internal Medicine

## 2017-04-24 DIAGNOSIS — I4891 Unspecified atrial fibrillation: Secondary | ICD-10-CM | POA: Diagnosis not present

## 2017-04-24 DIAGNOSIS — R0602 Shortness of breath: Secondary | ICD-10-CM | POA: Diagnosis not present

## 2017-04-24 DIAGNOSIS — E038 Other specified hypothyroidism: Secondary | ICD-10-CM

## 2017-04-24 DIAGNOSIS — E119 Type 2 diabetes mellitus without complications: Secondary | ICD-10-CM | POA: Diagnosis not present

## 2017-04-24 DIAGNOSIS — J8410 Pulmonary fibrosis, unspecified: Secondary | ICD-10-CM | POA: Diagnosis present

## 2017-04-24 DIAGNOSIS — J9601 Acute respiratory failure with hypoxia: Secondary | ICD-10-CM | POA: Diagnosis not present

## 2017-04-24 DIAGNOSIS — I10 Essential (primary) hypertension: Secondary | ICD-10-CM | POA: Diagnosis not present

## 2017-04-24 LAB — GLUCOSE, CAPILLARY: Glucose-Capillary: 295 mg/dL — ABNORMAL HIGH (ref 65–99)

## 2017-04-24 MED ORDER — PREDNISONE 10 MG PO TABS: ORAL_TABLET | ORAL | 0 refills | Status: DC

## 2017-04-24 MED ORDER — AZITHROMYCIN 250 MG PO TABS: 250.0000 mg | ORAL_TABLET | Freq: Every day | ORAL | 0 refills | Status: AC

## 2017-04-24 MED ORDER — ZOLPIDEM TARTRATE 5 MG PO TABS: 5.0000 mg | ORAL_TABLET | Freq: Every evening | ORAL | 0 refills | Status: DC | PRN

## 2017-04-24 NOTE — Progress Notes (Signed)
Inpatient Diabetes Program Recommendations  AACE/ADA: New Consensus Statement on Inpatient Glycemic Control (2015)  Target Ranges:  Prepandial:   less than 140 mg/dL      Peak postprandial:   less than 180 mg/dL (1-2 hours)      Critically ill patients:  140 - 180 mg/dL   Results for Kirsten [name], Kirsten [name] (MRN [id number]) as of 04/24/2017 10:15  Ref. Range 04/03/2016 11:14 04/23/2017 12:15 04/23/2017 17:19 04/23/2017 21:26 04/24/2017 07:45  Glucose-Capillary Latest Ref Range: 65 - 99 mg/dL 118 (H) 246 (H) 282 (H) 247 (H) 295 (H)  Results for Kirsten [name], Kirsten [name] (MRN [id number]) as of 04/24/2017 10:15  Ref. Range 01/16/2017 10:55  Hemoglobin A1C Latest Ref Range: 4.6 - 6.5 % 6.9 (H)   Review of Glycemic Control  Diabetes history: DM2 Outpatient Diabetes medications: Metformin 1000 mg BID Current orders for Inpatient glycemic control: Novolog 0-9 units TID with meals, Novolog 0-5 units QHS  Inpatient Diabetes Program Recommendations: Correction (SSI): Please consider increasing Novolog correction to Moderate scale (0-15 units). Insulin-Meal Coverage: If steroids are continued, please consider ordering Novolog 4 units TID with meals for meal coverage if patient eats at least 50% of meals.  Thanks, Barnie Alderman, RN, MSN, CDE Diabetes Coordinator Inpatient Diabetes Program [phone] (Team Pager from 8am to 5pm)

## 2017-04-24 NOTE — Plan of Care (Signed)
  Progressing Education: Knowledge of General Education information will improve 04/24/2017 0629 - Progressing by Rolland Bimler, [place] Behavior/Discharge Planning: Ability to manage health-related needs will improve 04/24/2017 0629 - Progressing by Rolland Bimler, RN Clinical Measurements: Ability to maintain clinical measurements within normal limits will improve 04/24/2017 0629 - Progressing by Rolland Bimler, RN Will remain free from infection 04/24/2017 0629 - Progressing by Rolland Bimler, RN Diagnostic test results will improve 04/24/2017 0629 - Progressing by Rolland Bimler, RN Respiratory complications will improve 04/24/2017 0629 - Progressing by Rolland Bimler, RN Cardiovascular complication will be avoided 04/24/2017 0629 - Progressing by Rolland Bimler, RN Activity: Risk for activity intolerance will decrease 04/24/2017 0629 - Progressing by Rolland Bimler, RN Nutrition: Adequate nutrition will be maintained 04/24/2017 0629 - Progressing by Rolland Bimler, RN Coping: Level of anxiety will decrease 04/24/2017 0629 - Progressing by Rolland Bimler, RN Elimination: Will not experience complications related to bowel motility 04/24/2017 0629 - Progressing by Rolland Bimler, RN Will not experience complications related to urinary retention 04/24/2017 0629 - Progressing by Rolland Bimler, RN Pain Managment: General experience of comfort will improve 04/24/2017 0629 - Progressing by Rolland Bimler, RN Safety: Ability to remain free from injury will improve 04/24/2017 0629 - Progressing by Rolland Bimler, RN Skin Integrity: Risk for impaired skin integrity will decrease 04/24/2017 0629 - Progressing by Rolland Bimler, RN Activity: Ability to tolerate increased activity will improve 04/24/2017 0629 - Progressing by Rolland Bimler, RN Will verbalize the  importance of balancing activity with adequate rest periods 04/24/2017 0629 - Progressing by Rolland Bimler, RN Respiratory: Ability to maintain a clear airway will improve 04/24/2017 0629 - Progressing by Rolland Bimler, RN Levels of oxygenation will improve 04/24/2017 0629 - Progressing by Rolland Bimler, RN

## 2017-04-24 NOTE — Discharge Instructions (Signed)

## 2017-04-24 NOTE — Progress Notes (Signed)
Patient alert and oriented, vss, no complaints of pain.  Patient to be discharged home.  Educated on new medications/given f/u appts.  No questions.  To be escorted out of hospital via wheelchair by volunteers.

## 2017-04-24 NOTE — Therapy (Signed)
Pt has home CPAP.  CPAP machine is in good shape.  Power cord is patent and unit does not have any defect.  Pt uses independently.

## 2017-04-24 NOTE — Discharge Summary (Signed)
[facility] [name]    MR#:  [id number]  DATE OF BIRTH:  29-Dec-1955  DATE OF ADMISSION:  04/23/2017 ADMITTING PHYSICIAN: Harrie Foreman, MD  DATE OF DISCHARGE: 04/24/2017 12:24 PM  PRIMARY CARE PHYSICIAN: [name], MD    ADMISSION DIAGNOSIS:  SOB (shortness of breath) [R06.02]  DISCHARGE DIAGNOSIS:  Active Problems:   Acute respiratory failure with hypoxia (HCC)   Pulmonary fibrosis ([place])   SECONDARY DIAGNOSIS:   Past Medical History:  Diagnosis Date  . A-fib ([place])   . Anxiety disorder   . Asthma   . Dyspnea   . Gastroschisis    umphalocele, rotated organs abd repair until age 32  . GERD (gastroesophageal reflux disease)   . HTN (hypertension)   . Hyperlipidemia   . IBS (irritable bowel syndrome)   . Morbid obesity ([place])    Target wt - 185  for BMI < 30  . Obesity   . OSA on CPAP   . Pulmonary fibrosis ([place])   . SBO (small bowel obstruction) ([place])    Resolved with NG/Bowel rest around 2009  . Sleep apnea   . Type II or unspecified type diabetes mellitus without mention of complication, not stated as uncontrolled     HOSPITAL COURSE:   62 year old female with past medical history of atrial fibrillation, hypertension,, diabetes,hypothyroidism who presents to the hospital due to shortness of breath and noted to be in acute respiratory failure with hypoxia.  1.acute respiratory failure with hypoxia-suspected to be secondary to bronchitis. -Patient was admitted to the hospital and treated with IV steroids, scheduled DuoNeb's, Pulmicort nebs and also given empiric Zithromax. -Chest x-ray on admission showed some bronchitic changes and CT chest obtained which showed scarring/fibrosis on the basis of the lungs but no other acute pneumonia or further process. -Patient has clinically improved and now being discharged on oral prednisone taper with some Zithromax. She was ambulated on room air and did  not qualify for home oxygen. -She will follow-up with her pulmonologist in outpatient.  2.chronic atrial fibrillation-rate controlled. Continue amiodarone, metoprolol. - she will Continue Eliquis.  3.diabetes type 2 without complication-the hospital patient was on sliding scale insulin, she will resume her metformin upon discharge  4. Essential hypertension- she will continue metoprolol, hydrochlorothiazide.  5. Anxiety - she will  cont. paxil  6. Hypothyroidism - she will cont. Synthroid.      DISCHARGE CONDITIONS:   Stable.   CONSULTS OBTAINED:    DRUG ALLERGIES:   Allergies  Allergen Reactions  . Factive [Gemifloxacin Mesylate] Rash  . Crestor [Rosuvastatin]     GI upset  . Sulfonamide Derivatives     REACTION: rash  . Gemifloxacin Rash  . Penicillins Hives and Rash    Has patient had a PCN reaction causing immediate rash, facial/tongue/throat swelling, SOB or lightheadedness with hypotension: No Has patient had a PCN reaction causing severe rash involving mucus membranes or skin necrosis: No Has patient had a PCN reaction that required hospitalization No Has patient had a PCN reaction occurring within the last 10 years: No If all of the above answers are "NO", then may proceed with Cephalosporin use.     DISCHARGE MEDICATIONS:   Allergies as of 04/24/2017      Reactions   Factive [gemifloxacin Mesylate] Rash   Crestor [rosuvastatin]    GI upset   Sulfonamide Derivatives    REACTION: rash   Gemifloxacin Rash   Penicillins Hives,  Rash   Has patient had a PCN reaction causing immediate rash, facial/tongue/throat swelling, SOB or lightheadedness with hypotension: No Has patient had a PCN reaction causing severe rash involving mucus membranes or skin necrosis: No Has patient had a PCN reaction that required hospitalization No Has patient had a PCN reaction occurring within the last 10 years: No If all of the above answers are "NO", then may proceed with  Cephalosporin use.      Medication List    TAKE these medications   amiodarone 200 MG tablet Commonly known as:  PACERONE Take 200 mg daily by mouth.   apixaban 5 MG Tabs tablet Commonly known as:  ELIQUIS Take 1 tablet (5 mg total) by mouth 2 (two) times daily.   atorvastatin 40 MG tablet Commonly known as:  LIPITOR Take 1 tablet (40 mg total) by mouth daily.   azithromycin 250 MG tablet Commonly known as:  ZITHROMAX Take 1 tablet (250 mg total) by mouth daily for 4 days.   glucose blood test strip Commonly known as:  ONE TOUCH ULTRA TEST Use as instructed   hydrochlorothiazide 25 MG tablet Commonly known as:  HYDRODIURIL Take 1 tablet (25 mg total) by mouth daily.   levothyroxine 100 MCG tablet Commonly known as:  SYNTHROID, LEVOTHROID TAKE 1 TABLET BY MOUTH ONCE DAILY   metFORMIN 1000 MG tablet Commonly known as:  GLUCOPHAGE take 1 tablet by mouth twice a day with A MEAL What changed:    how much to take  how to take this  when to take this  additional instructions   metoprolol succinate 50 MG 24 hr tablet Commonly known as:  TOPROL-XL Take 50 mg by mouth daily.   PARoxetine 25 MG 24 hr tablet Commonly known as:  PAXIL-CR Take 1 tablet (25 mg total) by mouth daily.   predniSONE 10 MG tablet Commonly known as:  DELTASONE Label  & dispense according to the schedule below. 5 Pills PO for 1 day then, 4 Pills PO for 1 day, 3 Pills PO for 1 day, 2 Pills PO for 1 day, 1 Pill PO for 1 days then STOP.   zolpidem 5 MG tablet Commonly known as:  AMBIEN Take 1 tablet (5 mg total) by mouth at bedtime as needed for sleep.         DISCHARGE INSTRUCTIONS:   DIET:  Cardiac diet and Diabetic diet  DISCHARGE CONDITION:  Stable  ACTIVITY:  Activity as tolerated  OXYGEN:  Home Oxygen: No.   Oxygen Delivery: room air  DISCHARGE LOCATION:  home   If you experience worsening of your admission symptoms, develop shortness of breath, life threatening  emergency, suicidal or homicidal thoughts you must seek medical attention immediately by calling 911 or calling your MD immediately  if symptoms less severe.  You Must read complete instructions/literature along with all the possible adverse reactions/side effects for all the Medicines you take and that have been prescribed to you. Take any new Medicines after you have completely understood and accpet all the possible adverse reactions/side effects.   Please note  You were cared for by a hospitalist during your hospital stay. If you have any questions about your discharge medications or the care you received while you were in the hospital after you are discharged, you can call the unit and asked to speak with the hospitalist on call if the hospitalist that took care of you is not available. Once you are discharged, your primary care physician will handle any further  medical issues. Please note that NO REFILLS for any discharge medications will be authorized once you are discharged, as it is imperative that you return to your primary care physician (or establish a relationship with a primary care physician if you do not have one) for your aftercare needs so that they can reassess your need for medications and monitor your lab values.     Today   Shortness of breath improved. + cough but non-productive.  No other complaints. Ambulated and did not qualify for Home o2. Will d/c home today.   VITAL SIGNS:  Blood pressure (!) 112/51, pulse 73, temperature 97.7 F (36.5 C), temperature source Oral, resp. rate 16, height 5' 7"  (1.702 m), weight 99.9 kg (220 lb 3.8 oz), SpO2 93 %.  I/O:    Intake/Output Summary (Last 24 hours) at 04/24/2017 1540 Last data filed at 04/24/2017 0900 Gross per 24 hour  Intake 360 ml  Output 2 ml  Net 358 ml    PHYSICAL EXAMINATION:  GENERAL:  62 y.o.-year-old patient lying in the bed with no acute distress.  EYES: Pupils equal, round, reactive to light and  accommodation. No scleral icterus. Extraocular muscles intact.  HEENT: Head atraumatic, normocephalic. Oropharynx and nasopharynx clear.  NECK:  Supple, no jugular venous distention. No thyroid enlargement, no tenderness.  LUNGS: Normal breath sounds bilaterally, no wheezing, basilar rales, No rhonchi. No use of accessory muscles of respiration.  CARDIOVASCULAR: S1, S2 normal. No murmurs, rubs, or gallops.  ABDOMEN: Soft, non-tender, non-distended. Bowel sounds present. No organomegaly or mass.  EXTREMITIES: No pedal edema, cyanosis, or clubbing.  NEUROLOGIC: Cranial nerves II through XII are intact. No focal motor or sensory defecits b/l.  PSYCHIATRIC: The patient is alert and oriented x 3.  SKIN: No obvious rash, lesion, or ulcer.   DATA REVIEW:   CBC Recent Labs  Lab 04/23/17 0518  WBC 10.3  HGB 14.7  HCT 42.4  PLT 318    Chemistries  Recent Labs  Lab 04/23/17 0518  NA 140  K 4.2  CL 99*  CO2 29  GLUCOSE 209*  BUN 15  CREATININE 0.76  CALCIUM 9.7    Cardiac Enzymes Recent Labs  Lab 04/23/17 0518  TROPONINI <0.03     RADIOLOGY:  Ct Chest Wo Contrast  Result Date: 04/23/2017 CLINICAL DATA:  62 year old female with history of atrial fibrillation and, sleep apnea, diabetes, hypertension and chronic lung disease presenting with shortness of breath. EXAM: CT CHEST WITHOUT CONTRAST TECHNIQUE: Multidetector CT imaging of the chest was performed following the standard protocol without IV contrast. COMPARISON:  Chest CT 04/01/2016. FINDINGS: Cardiovascular: Heart size is mildly enlarged. There is no significant pericardial fluid, thickening or pericardial calcification. There is aortic atherosclerosis, as well as atherosclerosis of the great vessels of the mediastinum and the coronary arteries, including calcified atherosclerotic plaque in the left anterior descending coronary artery. Persistent left superior vena cava draining into the coronary sinus (normal anatomical  variant) incidentally noted. Mediastinum/Nodes: No pathologically enlarged mediastinal or hilar lymph nodes. Please note that accurate exclusion of hilar adenopathy is limited on noncontrast CT scans. Esophagus is unremarkable in appearance. No axillary lymphadenopathy. Small left-sided Bochdalek's hernia incidentally noted. Lungs/Pleura: Small calcified granuloma in the periphery of the left upper lobe. No acute consolidative airspace disease. No pleural effusions. Widespread linear scarring noted in the lungs bilaterally, most severe throughout the right lung, unchanged compared to the prior study from 04/01/2016, likely sequela of prior infections. No pleural effusions. Upper Abdomen: 2.7 cm low-attenuation  lesion in the upper pole the right kidney, incompletely characterized on today's noncontrast CT examination, but similar to the prior study at which time it was characterized as a simple cyst. Musculoskeletal: There are no aggressive appearing lytic or blastic lesions noted in the visualized portions of the skeleton. IMPRESSION: 1. No acute findings noted in the thorax to account for the patient's symptoms. 2. Multifocal areas of chronic scarring in the lungs bilaterally (right greater than left), similar to the prior study, presumably secondary to prior infections. 3. Aortic atherosclerosis, in addition to left anterior descending coronary artery disease. Please note that although the presence of coronary artery calcium documents the presence of coronary artery disease, the severity of this disease and any potential stenosis cannot be assessed on this non-gated CT examination. Assessment for potential risk factor modification, dietary therapy or pharmacologic therapy may be warranted, if clinically indicated. 4. Additional incidental findings, as above. Aortic Atherosclerosis (ICD10-I70.0). Electronically Signed   By: Vinnie Langton M.D.   On: 04/23/2017 16:53   Dg Chest Port 1 View  Result Date:  04/23/2017 CLINICAL DATA:  62 y/o  F; cough and shortness of breath. EXAM: PORTABLE CHEST 1 VIEW COMPARISON:  03/29/2016 chest radiograph.  04/01/2016 CT chest. FINDINGS: Stable cardiac silhouette given projection and technique. Reticular opacities greatest in lung bases probably representing bronchitic changes. No consolidation, effusion, or pneumothorax. Bones are unremarkable. IMPRESSION: Reticular opacities in lung bases probably representing bronchitic changes. No focal consolidation identified. Electronically Signed   By: Kristine Garbe M.D.   On: 04/23/2017 05:53      Management plans discussed with the patient, family and they are in agreement.  CODE STATUS:     Code Status Orders  (From admission, onward)        Start     Ordered   04/23/17 0800  Full code  Continuous     04/23/17 0759    Code Status History    Date Active Date Inactive Code Status Order ID Comments User Context   03/29/2016 22:45 04/03/2016 18:21 Full Code [id number]  Gladstone Lighter, MD Inpatient    Advance Directive Documentation     Most Recent Value  Type of Advance Directive  Healthcare Power of Attorney, Living will  Pre-existing out of facility DNR order (yellow form or pink MOST form)  No data  "MOST" Form in Place?  No data      TOTAL TIME TAKING CARE OF THIS PATIENT: 40 minutes.    Henreitta Leber M.D on 04/24/2017 at 3:40 PM  Between 7am to 6pm - Pager - [phone]  After 6pm go to [email or web address] - password [email or web address] [facility]  Office  [phone]  CC: Primary care physician; [name], MD

## 2017-04-24 NOTE — Progress Notes (Signed)
Patient ambulated around nursing station on room air.  Oxygen saturations stayed above 92%.

## 2017-04-24 NOTE — Progress Notes (Signed)
Patient has no acute event overnight, patient remained on acute 2L of supplemental oxygen. Donut shaped  foam applied to patient bottom.

## 2017-04-25 ENCOUNTER — Telehealth: Payer: Self-pay | Admitting: *Deleted

## 2017-04-25 NOTE — Telephone Encounter (Signed)
Transition Care Management Follow-up Telephone Call   Date discharged? 04/24/17   How have you been since you were released from the hospital? Pt states she seem to be during lil better   Do you understand why you were in the hospital? YES   Do you understand the discharge instructions? YES   Where were you discharged to? Home   Items Reviewed:  Medications reviewed: YES  Allergies reviewed: YES  Dietary changes reviewed: NO  Referrals reviewed: No referral needed   Functional Questionnaire:   Activities of Daily Living (ADLs):   She states she are independent in the following: ambulation, bathing and hygiene, feeding, continence, grooming, toileting and dressing States she doesn't  require assistance    Any transportation issues/concerns?: NO   Any patient concerns? NO   Confirmed importance and date/time of follow-up visits scheduled YES, appt 05/02/17  Provider Appointment booked with Dr. Ronnald Ramp  Confirmed with patient if condition begins to worsen call PCP or go to the ER.  Patient was given the office number and encouraged to call back with question or concerns.  : YES

## 2017-05-01 ENCOUNTER — Encounter: Payer: Self-pay | Admitting: Acute Care

## 2017-05-01 ENCOUNTER — Ambulatory Visit: Payer: PPO | Admitting: Acute Care

## 2017-05-01 DIAGNOSIS — G4733 Obstructive sleep apnea (adult) (pediatric): Secondary | ICD-10-CM | POA: Diagnosis not present

## 2017-05-01 DIAGNOSIS — J9601 Acute respiratory failure with hypoxia: Secondary | ICD-10-CM | POA: Diagnosis not present

## 2017-05-01 NOTE — Patient Instructions (Addendum)
It is nice to see you. We are glad you are feeling better. If you have more frequent flares we will consider using a maintenance inhaler. Try xyzol instead of Allegra to see if it helps with your runny nose. We will schedule Pulmonary function Tests Continue on CPAP at bedtime. You appear to be benefiting from the treatment Goal is to wear for at least 6 hours each night for maximal clinical benefit. Continue to work on weight loss, as the link between excess weight  and sleep apnea is well established.  Do not drive if sleepy. Remember to clean mask, tubing, filter, and reservoir once weekly with soapy water.  Bring Sim card tomorrow so we can obtain a down Load. Follow up with Dr. Elsworth Soho or Judson Roch NP in 1 month after PFT's  Please contact office for sooner follow up if symptoms do not improve or worsen or seek emergency care

## 2017-05-01 NOTE — Assessment & Plan Note (Signed)
Compliant Will bring SIM card tomorrow Plan: We are glad you are feeling better. If you have more frequent flares we will consider using a maintenance inhaler. Try xyzol instead of Allegra to see if it helps with your runny nose. We will schedule Pulmonary function Tests Continue on CPAP at bedtime. You appear to be benefiting from the treatment Goal is to wear for at least 6 hours each night for maximal clinical benefit. Continue to work on weight loss, as the link between excess weight  and sleep apnea is well established.  Do not drive if sleepy. Remember to clean mask, tubing, filter, and reservoir once weekly with soapy water.  Bring Sim card tomorrow so we can obtain a down Load. Follow up with Dr. Elsworth Soho or Judson Roch NP in 1 month after PFT's  Please contact office for sooner follow up if symptoms do not improve or worsen or seek emergency care

## 2017-05-01 NOTE — Progress Notes (Addendum)
History of Present Illness Kirsten [name] is a 62 y.o. female never smoker with asthma, COPD and OSA on CPAP. She is followed by Dr. Elsworth Soho.   05/01/2017 Hospital Follow Up: Pt. Presents for hospital follow up. She was admitted to the hospital from 04/23/2017-04/26/2017  for acute respiratory Failure with hypoxia 2/2 asthma exacerbation caused by bronchitis. Viral testing was negative. She was treated with IV steroids, scheduled duo nebs, Pulmicort nebs, and also treated with empiric Zithromax.  Chest x-ray on admission showed some bronchitic changes, chest CT showed scarring/fibrosis on the base of the lungs but no other acute pneumonia or further process.  She clinically improved and was discharged on oral prednisone taper and Zithromax.  She was ambulated on room air prior to discharge and did not qualify for home oxygen.  The patient is here for follow-up. She states she has been compliant with her prednisone and her antibiotic.She states she is feeling better. She states she has no further discolored secretions. Her cough has resolved. She was compliant with he antibiotics and prednisone. She states she has been wearing her CPAP machine every night.She did not bring her sim card with her today.She does not take any maintenance medications for her asthma. She denies fever, chest pain, orthopnea or hemoptysis.  Test Results: PSG 2010- AHI of 80 events per hour. Maintained onauto CPAP  Down Load 04/02/2017-05/01/2017 AirSense 10 AutoSet Set pressure of 8 cm H2O Usage 30/30 days or 100% > 4 hours 27/30 days or 90% < 4 hours 3/31/ days or 10% Average Usage days used>> 7 hours and 7 minutes AHI=1.7  CBC Latest Ref Rng & Units 04/23/2017 09/10/2016 04/03/2016  WBC 3.6 - 11.0 K/uL 10.3 9.0 6.8  Hemoglobin 12.0 - 16.0 g/dL 14.7 14.8 12.7  Hematocrit 35.0 - 47.0 % 42.4 45.0 37.4  Platelets 150 - 440 K/uL 318 300.0 256    BMP Latest Ref Rng & Units 04/23/2017 01/16/2017 09/10/2016  Glucose 65 - 99  mg/dL 209(H) 124(H) 129(H)  BUN 6 - 20 mg/dL 15 18 11   Creatinine 0.44 - 1.00 mg/dL 0.76 0.71 0.74  Sodium 135 - 145 mmol/L 140 142 141  Potassium 3.5 - 5.1 mmol/L 4.2 4.5 4.0  Chloride 101 - 111 mmol/L 99(L) 103 99  CO2 22 - 32 mmol/L 29 31 32  Calcium 8.9 - 10.3 mg/dL 9.7 10.0 10.3    BNP    Component Value Date/Time   BNP 33 11/26/2013 1215    ProBNP    Component Value Date/Time   PROBNP 9.0 07/05/2010 1215    PFT No results found for: FEV1PRE, FEV1POST, FVCPRE, FVCPOST, TLC, DLCOUNC, PREFEV1FVCRT, PSTFEV1FVCRT  Ct Chest Wo Contrast  Result Date: 04/23/2017 CLINICAL DATA:  62 year old female with history of atrial fibrillation and, sleep apnea, diabetes, hypertension and chronic lung disease presenting with shortness of breath. EXAM: CT CHEST WITHOUT CONTRAST TECHNIQUE: Multidetector CT imaging of the chest was performed following the standard protocol without IV contrast. COMPARISON:  Chest CT 04/01/2016. FINDINGS: Cardiovascular: Heart size is mildly enlarged. There is no significant pericardial fluid, thickening or pericardial calcification. There is aortic atherosclerosis, as well as atherosclerosis of the great vessels of the mediastinum and the coronary arteries, including calcified atherosclerotic plaque in the left anterior descending coronary artery. Persistent left superior vena cava draining into the coronary sinus (normal anatomical variant) incidentally noted. Mediastinum/Nodes: No pathologically enlarged mediastinal or hilar lymph nodes. Please note that accurate exclusion of hilar adenopathy is limited on noncontrast CT scans. Esophagus  is unremarkable in appearance. No axillary lymphadenopathy. Small left-sided Bochdalek's hernia incidentally noted. Lungs/Pleura: Small calcified granuloma in the periphery of the left upper lobe. No acute consolidative airspace disease. No pleural effusions. Widespread linear scarring noted in the lungs bilaterally, most severe throughout  the right lung, unchanged compared to the prior study from 04/01/2016, likely sequela of prior infections. No pleural effusions. Upper Abdomen: 2.7 cm low-attenuation lesion in the upper pole the right kidney, incompletely characterized on today's noncontrast CT examination, but similar to the prior study at which time it was characterized as a simple cyst. Musculoskeletal: There are no aggressive appearing lytic or blastic lesions noted in the visualized portions of the skeleton. IMPRESSION: 1. No acute findings noted in the thorax to account for the patient's symptoms. 2. Multifocal areas of chronic scarring in the lungs bilaterally (right greater than left), similar to the prior study, presumably secondary to prior infections. 3. Aortic atherosclerosis, in addition to left anterior descending coronary artery disease. Please note that although the presence of coronary artery calcium documents the presence of coronary artery disease, the severity of this disease and any potential stenosis cannot be assessed on this non-gated CT examination. Assessment for potential risk factor modification, dietary therapy or pharmacologic therapy may be warranted, if clinically indicated. 4. Additional incidental findings, as above. Aortic Atherosclerosis (ICD10-I70.0). Electronically Signed   By: Vinnie Langton M.D.   On: 04/23/2017 16:53   Dg Chest Port 1 View  Result Date: 04/23/2017 CLINICAL DATA:  62 y/o  F; cough and shortness of breath. EXAM: PORTABLE CHEST 1 VIEW COMPARISON:  03/29/2016 chest radiograph.  04/01/2016 CT chest. FINDINGS: Stable cardiac silhouette given projection and technique. Reticular opacities greatest in lung bases probably representing bronchitic changes. No consolidation, effusion, or pneumothorax. Bones are unremarkable. IMPRESSION: Reticular opacities in lung bases probably representing bronchitic changes. No focal consolidation identified. Electronically Signed   By: Kristine Garbe  M.D.   On: 04/23/2017 05:53     Past medical hx Past Medical History:  Diagnosis Date  . A-fib ([place])   . Anxiety disorder   . Asthma   . Dyspnea   . Gastroschisis    umphalocele, rotated organs abd repair until age 6  . GERD (gastroesophageal reflux disease)   . HTN (hypertension)   . Hyperlipidemia   . IBS (irritable bowel syndrome)   . Morbid obesity ([place])    Target wt - 185  for BMI < 30  . Obesity   . OSA on CPAP   . Pulmonary fibrosis ([place])   . SBO (small bowel obstruction) ([place])    Resolved with NG/Bowel rest around 2009  . Sleep apnea   . Type II or unspecified type diabetes mellitus without mention of complication, not stated as uncontrolled      Social History   Tobacco Use  . Smoking status: Never Smoker  . Smokeless tobacco: Never Used  . Tobacco comment: "tried as a teen"  Substance Use Topics  . Alcohol use: Yes    Alcohol/week: 1.2 oz    Types: 2 Glasses of wine per week    Comment: rare  . Drug use: No    Ms.Coe reports that  has never smoked. she has never used smokeless tobacco. She reports that she drinks about 1.2 oz of alcohol per week. She reports that she does not use drugs.  Tobacco Cessation: Never smoker  Past surgical hx, Family hx, Social hx all reviewed.  Current Outpatient Medications on File Prior to Visit  Medication Sig  . amiodarone (PACERONE) 200 MG tablet Take 200 mg daily by mouth.  Marland Kitchen apixaban (ELIQUIS) 5 MG TABS tablet Take 1 tablet (5 mg total) by mouth 2 (two) times daily.  Marland Kitchen atorvastatin (LIPITOR) 40 MG tablet Take 1 tablet (40 mg total) by mouth daily.  Marland Kitchen glucose blood (ONE TOUCH ULTRA TEST) test strip Use as instructed  . hydrochlorothiazide (HYDRODIURIL) 25 MG tablet Take 1 tablet (25 mg total) by mouth daily.  Marland Kitchen levothyroxine (SYNTHROID, LEVOTHROID) 100 MCG tablet TAKE 1 TABLET BY MOUTH ONCE DAILY  . metFORMIN (GLUCOPHAGE) 1000 MG tablet take 1 tablet by mouth twice a day with A MEAL (Patient taking differently:  Take 1,000 mg by mouth 2 (two) times daily with a meal. )  . metoprolol succinate (TOPROL-XL) 50 MG 24 hr tablet Take 50 mg by mouth daily.   Marland Kitchen PARoxetine (PAXIL-CR) 25 MG 24 hr tablet Take 1 tablet (25 mg total) by mouth daily.  Marland Kitchen zolpidem (AMBIEN) 5 MG tablet Take 1 tablet (5 mg total) by mouth at bedtime as needed for sleep.   No current facility-administered medications on file prior to visit.      Allergies  Allergen Reactions  . Factive [Gemifloxacin Mesylate] Rash  . Crestor [Rosuvastatin]     GI upset  . Sulfonamide Derivatives     REACTION: rash  . Gemifloxacin Rash  . Penicillins Hives and Rash    Has patient had a PCN reaction causing immediate rash, facial/tongue/throat swelling, SOB or lightheadedness with hypotension: No Has patient had a PCN reaction causing severe rash involving mucus membranes or skin necrosis: No Has patient had a PCN reaction that required hospitalization No Has patient had a PCN reaction occurring within the last 10 years: No If all of the above answers are "NO", then may proceed with Cephalosporin use.     Review Of Systems:  Constitutional:   No  weight loss, night sweats,  Fevers, chills, fatigue, or  lassitude.  HEENT:   No headaches,  Difficulty swallowing,  Tooth/dental problems, or  Sore throat,                No sneezing, itching, ear ache, nasal congestion, post nasal drip,   CV:  No chest pain,  Orthopnea, PND, swelling in lower extremities, anasarca, dizziness, palpitations, syncope.   GI  No heartburn, indigestion, abdominal pain, nausea, vomiting, diarrhea, change in bowel habits, loss of appetite, bloody stools.   Resp: No shortness of breath with exertion or at rest.  No excess mucus, no productive cough,  No non-productive cough,  No coughing up of blood.  No change in color of mucus.  No wheezing.  No chest wall deformity  Skin: no rash or lesions.  GU: no dysuria, change in color of urine, no urgency or frequency.  No  flank pain, no hematuria   MS:  No joint pain or swelling.  No decreased range of motion.  No back pain.  Psych:  No change in mood or affect. No depression or anxiety.  No memory loss.   Vital Signs BP 122/70 (BP Location: Left Arm, Cuff Size: Normal)   Pulse 82   Ht 5' 7"  (1.702 m)   Wt 217 lb (98.4 kg)   SpO2 95%   BMI 33.99 kg/m    Physical Exam:  General- No distress,  A&Ox3, pleasant ENT: No sinus tenderness, TM clear, pale nasal mucosa, no oral exudate,no post nasal drip, no LAN Cardiac: S1, S2, regular rate and  rhythm, no murmur Chest: No wheeze/ rales/ dullness; no accessory muscle use, no nasal flaring, no sternal retractions Abd.: Soft Non-tender, non-distended, obese Ext: No clubbing cyanosis, edema Neuro:  normal strength, MAE x 4, A&O x 3 Skin: No rashes, warm and dry Psych: normal mood and behavior   Assessment/Plan  Acute respiratory failure with hypoxia [facility]) Required hospitalization 3.2019 Resolved Better after treatment with ABX and pred Plan: We are glad you are feeling better. If you have more frequent flares we will consider using a maintenance inhaler. Try xyzol instead of Allegra to see if it helps with your runny nose. We will schedule Pulmonary function Tests Continue on CPAP at bedtime. You appear to be benefiting from the treatment Goal is to wear for at least 6 hours each night for maximal clinical benefit. Continue to work on weight loss, as the link between excess weight  and sleep apnea is well established.  Do not drive if sleepy. Remember to clean mask, tubing, filter, and reservoir once weekly with soapy water.  Bring Sim card tomorrow so we can obtain a down Load. Follow up with Dr. Elsworth Soho or Judson Roch NP in 1 month after PFT's  Please contact office for sooner follow up if symptoms do not improve or worsen or seek emergency care      Magdalen Spatz, NP 05/01/2017  4:51 PM

## 2017-05-01 NOTE — Assessment & Plan Note (Addendum)
Required hospitalization 3.2019 Resolved Better after treatment with ABX and pred Plan: We are glad you are feeling better. If you have more frequent flares we will consider using a maintenance inhaler. Try xyzol instead of Allegra to see if it helps with your runny nose. We will schedule Pulmonary function Tests Continue on CPAP at bedtime. You appear to be benefiting from the treatment Goal is to wear for at least 6 hours each night for maximal clinical benefit. Continue to work on weight loss, as the link between excess weight  and sleep apnea is well established.  Do not drive if sleepy. Remember to clean mask, tubing, filter, and reservoir once weekly with soapy water.  Bring Sim card tomorrow so we can obtain a down Load. Follow up with Dr. Elsworth Soho or Judson Roch NP in 1 month after PFT's  Please contact office for sooner follow up if symptoms do not improve or worsen or seek emergency care

## 2017-05-02 ENCOUNTER — Ambulatory Visit (INDEPENDENT_AMBULATORY_CARE_PROVIDER_SITE_OTHER): Payer: PPO | Admitting: Internal Medicine

## 2017-05-02 ENCOUNTER — Telehealth: Payer: Self-pay | Admitting: Acute Care

## 2017-05-02 ENCOUNTER — Encounter: Payer: Self-pay | Admitting: Internal Medicine

## 2017-05-02 VITALS — BP 122/72 | HR 73 | Temp 98.0°F | Resp 16 | Ht 67.0 in | Wt 218.0 lb

## 2017-05-02 DIAGNOSIS — E118 Type 2 diabetes mellitus with unspecified complications: Secondary | ICD-10-CM | POA: Diagnosis not present

## 2017-05-02 DIAGNOSIS — E538 Deficiency of other specified B group vitamins: Secondary | ICD-10-CM | POA: Diagnosis not present

## 2017-05-02 DIAGNOSIS — F5105 Insomnia due to other mental disorder: Secondary | ICD-10-CM

## 2017-05-02 DIAGNOSIS — F409 Phobic anxiety disorder, unspecified: Secondary | ICD-10-CM

## 2017-05-02 LAB — POCT GLYCOSYLATED HEMOGLOBIN (HGB A1C): HEMOGLOBIN A1C: 7.3

## 2017-05-02 MED ORDER — ZOLPIDEM TARTRATE 5 MG PO TABS: 5.0000 mg | ORAL_TABLET | Freq: Every evening | ORAL | 1 refills | Status: DC | PRN

## 2017-05-02 MED ORDER — CYANOCOBALAMIN 1000 MCG/ML IJ SOLN
1000.0000 ug | Freq: Once | INTRAMUSCULAR | Status: AC
  Administered 2017-05-02: 1000 ug via INTRAMUSCULAR

## 2017-05-02 NOTE — Progress Notes (Signed)
Subjective:  Patient ID: Kirsten [name], female    DOB: [birth year]  Age: 62 y.o. MRN: [id number]  CC: Diabetes   HPI [name] presents for f/up - She was recently admitted for a respiratory tract infection and had to take a course of steroids.  She tells me her blood sugars have been up some but she has had no polyuria, polydipsia, or polyphagia.  She feels much better now tells me that all of her respiratory symptoms have resolved.  Outpatient Medications Prior to Visit  Medication Sig Dispense Refill  . amiodarone (PACERONE) 200 MG tablet Take 200 mg daily by mouth.    Marland Kitchen apixaban (ELIQUIS) 5 MG TABS tablet Take 1 tablet (5 mg total) by mouth 2 (two) times daily. 180 tablet 1  . atorvastatin (LIPITOR) 40 MG tablet Take 1 tablet (40 mg total) by mouth daily. 90 tablet 1  . glucose blood (ONE TOUCH ULTRA TEST) test strip Use as instructed 100 each 12  . hydrochlorothiazide (HYDRODIURIL) 25 MG tablet Take 1 tablet (25 mg total) by mouth daily. 90 tablet 1  . levothyroxine (SYNTHROID, LEVOTHROID) 100 MCG tablet TAKE 1 TABLET BY MOUTH ONCE DAILY 90 tablet 0  . metFORMIN (GLUCOPHAGE) 1000 MG tablet take 1 tablet by mouth twice a day with A MEAL (Patient taking differently: Take 1,000 mg by mouth 2 (two) times daily with a meal. ) 180 tablet 0  . metoprolol succinate (TOPROL-XL) 50 MG 24 hr tablet Take 50 mg by mouth daily.     Marland Kitchen PARoxetine (PAXIL-CR) 25 MG 24 hr tablet Take 1 tablet (25 mg total) by mouth daily. 90 tablet 1  . zolpidem (AMBIEN) 5 MG tablet Take 1 tablet (5 mg total) by mouth at bedtime as needed for sleep. 7 tablet 0   No facility-administered medications prior to visit.     ROS Review of Systems  Constitutional: Negative for diaphoresis and fatigue.  HENT: Negative.   Eyes: Negative for visual disturbance.  Respiratory: Negative for cough, chest tightness, shortness of breath and wheezing.   Cardiovascular: Negative for chest pain, palpitations and leg  swelling.  Gastrointestinal: Negative for abdominal pain, diarrhea, nausea and vomiting.  Endocrine: Negative for polydipsia, polyphagia and polyuria.  Genitourinary: Negative.  Negative for decreased urine volume, difficulty urinating, dysuria, hematuria and urgency.  Musculoskeletal: Negative for arthralgias, back pain, myalgias and neck pain.  Skin: Negative for color change and pallor.  Allergic/Immunologic: Negative.   Neurological: Negative.  Negative for dizziness, weakness and light-headedness.  Hematological: Negative for adenopathy. Does not bruise/bleed easily.  Psychiatric/Behavioral: Positive for sleep disturbance. Negative for behavioral problems, dysphoric mood, self-injury and suicidal ideas. The patient is nervous/anxious.     Objective:  BP 122/72 (BP Location: Left Arm, Patient Position: Sitting, Cuff Size: Large)   Pulse 73   Temp 98 F (36.7 C) (Oral)   Resp 16   Ht 5' 7"  (1.702 m)   Wt 218 lb (98.9 kg)   SpO2 98%   BMI 34.14 kg/m   BP Readings from Last 3 Encounters:  05/02/17 122/72  05/01/17 122/70  04/24/17 (!) 112/51    Wt Readings from Last 3 Encounters:  05/02/17 218 lb (98.9 kg)  05/01/17 217 lb (98.4 kg)  04/24/17 220 lb 3.8 oz (99.9 kg)    Physical Exam  Constitutional: She is oriented to person, place, and time.  HENT:  Mouth/Throat: Oropharynx is clear and moist. No oropharyngeal exudate.  Eyes: Conjunctivae are normal. Left eye exhibits no  discharge. No scleral icterus.  Neck: Normal range of motion. Neck supple. No JVD present. No thyromegaly present.  Cardiovascular: Normal rate, regular rhythm and normal heart sounds. Exam reveals no gallop and no friction rub.  No murmur heard. Pulmonary/Chest: Effort normal and breath sounds normal. No respiratory distress. She has no wheezes. She has no rales.  Abdominal: Soft. Bowel sounds are normal. She exhibits no distension and no mass. There is no tenderness. There is no guarding.    Musculoskeletal: Normal range of motion. She exhibits no edema, tenderness or deformity.  Lymphadenopathy:    She has no cervical adenopathy.  Neurological: She is alert and oriented to person, place, and time.  Skin: Skin is warm and dry. No rash noted. She is not diaphoretic. No erythema. No pallor.  Vitals reviewed.   Lab Results  Component Value Date   WBC 10.3 04/23/2017   HGB 14.7 04/23/2017   HCT 42.4 04/23/2017   PLT 318 04/23/2017   GLUCOSE 209 (H) 04/23/2017   CHOL 189 03/21/2017   TRIG 236.0 (H) 03/21/2017   HDL 43.60 03/21/2017   LDLDIRECT 119.0 03/21/2017   LDLCALC SEE COMMENT 11/26/2013   ALT 28 09/10/2016   AST 22 09/10/2016   NA 140 04/23/2017   K 4.2 04/23/2017   CL 99 (L) 04/23/2017   CREATININE 0.76 04/23/2017   BUN 15 04/23/2017   CO2 29 04/23/2017   TSH 2.98 03/21/2017   INR 1.1 11/27/2013   HGBA1C 7.3 05/02/2017   MICROALBUR <0.7 09/10/2016    Ct Chest Wo Contrast  Result Date: 04/23/2017 CLINICAL DATA:  62 year old female with history of atrial fibrillation and, sleep apnea, diabetes, hypertension and chronic lung disease presenting with shortness of breath. EXAM: CT CHEST WITHOUT CONTRAST TECHNIQUE: Multidetector CT imaging of the chest was performed following the standard protocol without IV contrast. COMPARISON:  Chest CT 04/01/2016. FINDINGS: Cardiovascular: Heart size is mildly enlarged. There is no significant pericardial fluid, thickening or pericardial calcification. There is aortic atherosclerosis, as well as atherosclerosis of the great vessels of the mediastinum and the coronary arteries, including calcified atherosclerotic plaque in the left anterior descending coronary artery. Persistent left superior vena cava draining into the coronary sinus (normal anatomical variant) incidentally noted. Mediastinum/Nodes: No pathologically enlarged mediastinal or hilar lymph nodes. Please note that accurate exclusion of hilar adenopathy is limited on  noncontrast CT scans. Esophagus is unremarkable in appearance. No axillary lymphadenopathy. Small left-sided Bochdalek's hernia incidentally noted. Lungs/Pleura: Small calcified granuloma in the periphery of the left upper lobe. No acute consolidative airspace disease. No pleural effusions. Widespread linear scarring noted in the lungs bilaterally, most severe throughout the right lung, unchanged compared to the prior study from 04/01/2016, likely sequela of prior infections. No pleural effusions. Upper Abdomen: 2.7 cm low-attenuation lesion in the upper pole the right kidney, incompletely characterized on today's noncontrast CT examination, but similar to the prior study at which time it was characterized as a simple cyst. Musculoskeletal: There are no aggressive appearing lytic or blastic lesions noted in the visualized portions of the skeleton. IMPRESSION: 1. No acute findings noted in the thorax to account for the patient's symptoms. 2. Multifocal areas of chronic scarring in the lungs bilaterally (right greater than left), similar to the prior study, presumably secondary to prior infections. 3. Aortic atherosclerosis, in addition to left anterior descending coronary artery disease. Please note that although the presence of coronary artery calcium documents the presence of coronary artery disease, the severity of this disease and any  potential stenosis cannot be assessed on this non-gated CT examination. Assessment for potential risk factor modification, dietary therapy or pharmacologic therapy may be warranted, if clinically indicated. 4. Additional incidental findings, as above. Aortic Atherosclerosis (ICD10-I70.0). Electronically Signed   By: Vinnie Langton M.D.   On: 04/23/2017 16:53   Dg Chest Port 1 View  Result Date: 04/23/2017 CLINICAL DATA:  62 y/o  F; cough and shortness of breath. EXAM: PORTABLE CHEST 1 VIEW COMPARISON:  03/29/2016 chest radiograph.  04/01/2016 CT chest. FINDINGS: Stable cardiac  silhouette given projection and technique. Reticular opacities greatest in lung bases probably representing bronchitic changes. No consolidation, effusion, or pneumothorax. Bones are unremarkable. IMPRESSION: Reticular opacities in lung bases probably representing bronchitic changes. No focal consolidation identified. Electronically Signed   By: Kristine Garbe M.D.   On: 04/23/2017 05:53    Assessment & Plan:   Yocheved was seen today for diabetes.  Diagnoses and all orders for this visit:  Vitamin B 12 deficiency -     cyanocobalamin ((VITAMIN B-12)) injection 1,000 mcg  Insomnia due to anxiety and fear -     zolpidem (AMBIEN) 5 MG tablet; Take 1 tablet (5 mg total) by mouth at bedtime as needed for sleep.  Type 2 diabetes mellitus with complication, unspecified whether long term insulin use ([place])- Her A1c is up to 7.3%.  She does not want to add another medication at this time.  She does agree to work on her lifestyle modifications. -     POCT glycosylated hemoglobin (Hb A1C)   I am having Jem A. Berkey maintain her glucose blood, metoprolol succinate, apixaban, amiodarone, PARoxetine, hydrochlorothiazide, metFORMIN, atorvastatin, levothyroxine, and zolpidem. We administered cyanocobalamin.  Meds ordered this encounter  Medications  . zolpidem (AMBIEN) 5 MG tablet    Sig: Take 1 tablet (5 mg total) by mouth at bedtime as needed for sleep.    Dispense:  90 tablet    Refill:  1  . cyanocobalamin ((VITAMIN B-12)) injection 1,000 mcg     Follow-up: No follow-ups on file.  [name], MD

## 2017-05-02 NOTE — Telephone Encounter (Signed)
Download as been printed and placed in SG's cubby for review. Nothing further is needed,  Will route to sarah and Jonelle Sidle to make aware.

## 2017-05-04 ENCOUNTER — Encounter: Payer: Self-pay | Admitting: Internal Medicine

## 2017-05-04 NOTE — Patient Instructions (Signed)

## 2017-05-07 NOTE — Telephone Encounter (Signed)
Sarah please advise on download. Thanks!

## 2017-05-07 NOTE — Telephone Encounter (Signed)
Please let patient know that her down Load looks great. AHI is down to 1.7 per hour. Remind her :   Continue on CPAP at bedtime. You appear to be benefiting from the treatmentwith AHI of 1.7 Goal is to wear for at least 6 hours each night for maximal clinical benefit. Continue to work on weight loss, as the link between excess weight  and sleep apnea is well established.  Do not drive if sleepy. Remember to clean mask, tubing, filter, and reservoir once weekly with soapy water.  Follow up with Dr. Elsworth Soho or Judson Roch  In 1 month after PFT's or before as needed.

## 2017-05-07 NOTE — Telephone Encounter (Signed)
Here is the down load info. Thanks so much.

## 2017-05-07 NOTE — Telephone Encounter (Signed)
atc pt, no answer and no vm.  Wcb.  

## 2017-05-08 NOTE — Telephone Encounter (Signed)
Spoke with pt. She is aware of her download results. Nothing further was needed.

## 2017-05-20 ENCOUNTER — Ambulatory Visit: Payer: PPO | Admitting: Internal Medicine

## 2017-05-30 ENCOUNTER — Ambulatory Visit (INDEPENDENT_AMBULATORY_CARE_PROVIDER_SITE_OTHER): Payer: PPO | Admitting: Family Medicine

## 2017-05-30 ENCOUNTER — Encounter: Payer: Self-pay | Admitting: Family Medicine

## 2017-05-30 DIAGNOSIS — M25511 Pain in right shoulder: Secondary | ICD-10-CM | POA: Diagnosis not present

## 2017-05-30 NOTE — Patient Instructions (Signed)
Please try the exercises  Please follow up with me in 4-6 weeks if you don't have improvement.

## 2017-05-30 NOTE — Progress Notes (Signed)
Kirsten [name] - 62 y.o. female MRN [id number]  Date of birth: 05-Nov-1955  SUBJECTIVE:  Including CC & ROS.  Chief Complaint  Patient presents with  . Right shoulder pain    Kirsten [name] is a 62 y.o. female that is  Presenting with right shoulder pain. Pain has been increasing over the past two weeks.  She does have pain that radiates down the arm but not past the elbow. Difficulty with overhead motions. Mild to severe pain when she raises her arm. Denies injury or surgeries.She received a previous injection in October for the same pain.  At that time the ultrasound showed calcium within the subacromial space.  Had significant improvement with that injection.  Denies any recent injury.  She is retired.  She does take care of her grandson.  She has pain with picking him up.   Review of Systems  Constitutional: Negative for fever.  HENT: Negative for congestion.   Respiratory: Negative for cough.   Cardiovascular: Negative for chest pain.  Gastrointestinal: Negative for abdominal pain.  Musculoskeletal: Negative for back pain.  Skin: Negative for color change.  Neurological: Negative for weakness.  Hematological: Negative for adenopathy.  Psychiatric/Behavioral: Negative for agitation.    HISTORY: Past Medical, Surgical, Social, and Family History Reviewed & Updated per EMR.   Pertinent Historical Findings include:  Past Medical History:  Diagnosis Date  . A-fib ([place])   . Anxiety disorder   . Asthma   . Dyspnea   . Gastroschisis    umphalocele, rotated organs abd repair until age 58  . GERD (gastroesophageal reflux disease)   . HTN (hypertension)   . Hyperlipidemia   . IBS (irritable bowel syndrome)   . Morbid obesity ([place])    Target wt - 185  for BMI < 30  . Obesity   . OSA on CPAP   . Pulmonary fibrosis ([place])   . SBO (small bowel obstruction) ([place])    Resolved with NG/Bowel rest around 2009  . Sleep apnea   . Type II or unspecified type diabetes mellitus without  mention of complication, not stated as uncontrolled     Past Surgical History:  Procedure Laterality Date  . BREAST EXCISIONAL BIOPSY Left 03/19/2012   neg  . CESAREAN SECTION    . CHOLECYSTECTOMY  1992  . Newborn Surgery - GI - ORGANS OUTSIDE ABDOMEN    . Small Bowel Repair    . TUBAL LIGATION  1988    Allergies  Allergen Reactions  . Factive [Gemifloxacin Mesylate] Rash  . Crestor [Rosuvastatin]     GI upset  . Sulfonamide Derivatives     REACTION: rash  . Gemifloxacin Rash  . Penicillins Hives and Rash    Has patient had a PCN reaction causing immediate rash, facial/tongue/throat swelling, SOB or lightheadedness with hypotension: No Has patient had a PCN reaction causing severe rash involving mucus membranes or skin necrosis: No Has patient had a PCN reaction that required hospitalization No Has patient had a PCN reaction occurring within the last 10 years: No If all of the above answers are "NO", then may proceed with Cephalosporin use.     Family History  Problem Relation Age of Onset  . Lymphoma Mother   . Diabetes type II Father   . Colon cancer Father   . Diabetes type I Son   . Diabetes Maternal Grandmother   . Goiter Neg Hx   . Breast cancer Neg Hx   . Ovarian cancer Neg Hx  Social History   Socioeconomic History  . Marital status: Married    Spouse name: Not on file  . Number of children: Not on file  . Years of education: Not on file  . Highest education level: Not on file  Occupational History  . 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 Financial resource strain: Not on file  . Food insecurity:    Worry: Not on file    Inability: Not on file  . Transportation needs:    Medical: Not on file    Non-medical: Not on file  Tobacco Use  . Smoking status: Never Smoker  . Smokeless tobacco: Never Used  . Tobacco comment: "tried as a teen"  Substance and Sexual Activity  . Alcohol use: Yes     Alcohol/week: 1.2 oz    Types: 2 Glasses of wine per week    Comment: rare  . Drug use: No  . Sexual activity: Yes    Birth control/protection: Surgical  Lifestyle  . Physical activity:    Days per week: 2 days    Minutes per session: 30 min  . Stress: Not on file  Relationships  . Social connections:    Talks on phone: Not on file    Gets together: Not on file    Attends religious service: Not on file    Active member of club or organization: Not on file    Attends meetings of clubs or organizations: Not on file    Relationship status: Not on file  . Intimate partner violence:    Fear of current or ex partner: Not on file    Emotionally abused: Not on file    Physically abused: Not on file    Forced sexual activity: Not on file  Other Topics Concern  . Not on file  Social History Narrative   Regular Exercise -  YES   Daily Caffeine Use:  2   Active and independent at baseline        PHYSICAL EXAM:  VS: BP 130/68 (BP Location: Left Arm, Patient Position: Sitting, Cuff Size: Normal)   Pulse 75   Temp 98.2 F (36.8 C) (Oral)   Ht 5' 7"  (1.702 m)   Wt 217 lb (98.4 kg)   SpO2 98%   BMI 33.99 kg/m  Physical Exam Gen: NAD, alert, cooperative with exam, well-appearing ENT: normal lips, normal nasal mucosa,  Eye: normal EOM, normal conjunctiva and lids CV:  no edema, +2 pedal pulses   Resp: no accessory muscle use, non-labored,  Skin: no rashes, no areas of induration  Neuro: normal tone, normal sensation to touch Psych:  normal insight, alert and oriented MSK:  Right Shoulder: Inspection reveals no abnormalities, atrophy or asymmetry. Palpation is normal with no tenderness over AC joint  Normal Active flexion and extension  Rotator cuff strength normal throughout. No signs of impingement with negative Hawkin's tests, empty can sign. Neurovascularly intact.    Aspiration/Injection Procedure Note [name] 14-Jan-1956  Procedure: Injection Indications:  right shoulder pain   Procedure Details Consent: Risks of procedure as well as the alternatives and risks of each were explained to the (patient/caregiver).  Consent for procedure obtained. Time Out: Verified patient identification, verified procedure, site/side was marked, verified correct patient position, special equipment/implants available, medications/allergies/relevent history reviewed, required imaging and test results available.  Performed.  The area was cleaned with iodine and alcohol swabs.    The right subacromial space  was injected using 1 cc's of 40 mg Depomedrol and 4 cc's of 1% lidocaine with a 25 1 1/2" needle.  Ultrasound was used. Images were obtained in Transverse views showing the injection.    A sterile dressing was applied.  Patient did tolerate procedure well.             ASSESSMENT & PLAN:   Right shoulder pain Has a subacromial bursitis on exam. The calcium was not seen from previous ultrasound. Not suggestive of adhesive capsulitis.  - subacromial injection today  - counseled on HEP  - if no improvement consider imaging and PT.

## 2017-05-31 DIAGNOSIS — M25511 Pain in right shoulder: Secondary | ICD-10-CM | POA: Insufficient documentation

## 2017-05-31 NOTE — Assessment & Plan Note (Signed)
Has a subacromial bursitis on exam. The calcium was not seen from previous ultrasound. Not suggestive of adhesive capsulitis.  - subacromial injection today  - counseled on HEP  - if no improvement consider imaging and PT.

## 2017-06-22 ENCOUNTER — Other Ambulatory Visit: Payer: Self-pay | Admitting: Internal Medicine

## 2017-06-22 DIAGNOSIS — E118 Type 2 diabetes mellitus with unspecified complications: Secondary | ICD-10-CM

## 2017-07-09 DIAGNOSIS — I4891 Unspecified atrial fibrillation: Secondary | ICD-10-CM | POA: Diagnosis not present

## 2017-07-09 DIAGNOSIS — I10 Essential (primary) hypertension: Secondary | ICD-10-CM | POA: Diagnosis not present

## 2017-07-09 DIAGNOSIS — R0602 Shortness of breath: Secondary | ICD-10-CM | POA: Diagnosis not present

## 2017-07-09 DIAGNOSIS — I2510 Atherosclerotic heart disease of native coronary artery without angina pectoris: Secondary | ICD-10-CM | POA: Diagnosis not present

## 2017-07-17 ENCOUNTER — Other Ambulatory Visit: Payer: Self-pay | Admitting: Internal Medicine

## 2017-07-17 DIAGNOSIS — E038 Other specified hypothyroidism: Secondary | ICD-10-CM

## 2017-07-24 ENCOUNTER — Ambulatory Visit (INDEPENDENT_AMBULATORY_CARE_PROVIDER_SITE_OTHER)
Admission: RE | Admit: 2017-07-24 | Discharge: 2017-07-24 | Disposition: A | Discharge status: Home / Self Care | Payer: PPO | Source: Ambulatory Visit | Attending: Family Medicine | Admitting: Family Medicine

## 2017-07-24 ENCOUNTER — Encounter: Payer: Self-pay | Admitting: Family Medicine

## 2017-07-24 ENCOUNTER — Ambulatory Visit (INDEPENDENT_AMBULATORY_CARE_PROVIDER_SITE_OTHER): Payer: PPO | Admitting: Family Medicine

## 2017-07-24 ENCOUNTER — Ambulatory Visit: Payer: Self-pay

## 2017-07-24 VITALS — BP 120/78 | HR 69 | Ht 67.0 in | Wt 221.0 lb

## 2017-07-24 DIAGNOSIS — M25511 Pain in right shoulder: Secondary | ICD-10-CM | POA: Diagnosis not present

## 2017-07-24 DIAGNOSIS — G8929 Other chronic pain: Secondary | ICD-10-CM | POA: Diagnosis not present

## 2017-07-24 MED ORDER — VITAMIN D (ERGOCALCIFEROL) 1.25 MG (50000 UNIT) PO CAPS: 50000.0000 [IU] | ORAL_CAPSULE | ORAL | 0 refills | Status: DC

## 2017-07-24 NOTE — Progress Notes (Signed)
[name] [address] [place], [place] [address] Phone: [phone] Subjective:     CC: Right-sided shoulder pain  POE:[id number]  Kirsten [name] is a 62 y.o. female coming in with complaint of shoulder pain. Patient had an injection 6 months ago that we gave her. Saw Dr. Raeford Razor and was given an injection. Injection did not alleviate pain. Unable to sleep and is in constant pain.  Patient continues to have pain.  Feels that it is about the same as what it was previously.  Feels like she did not have any help with the last injection.  Denies any radiation of the arm or any numbness or tingling.  No association with food.  Worsening pain at night even.     Past Medical History:  Diagnosis Date  . A-fib ([place])   . Anxiety disorder   . Asthma   . Dyspnea   . 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 GERD (gastroesophageal reflux disease)   . HTN (hypertension)   . Hyperlipidemia   . IBS (irritable bowel syndrome)   . Morbid obesity ([place])    Target wt - 185  for BMI < 30  . Obesity   . OSA on CPAP   . Pulmonary fibrosis ([place])   . SBO (small bowel obstruction) ([place])    Resolved with NG/Bowel rest around 2009  . Sleep apnea   . Type II or unspecified type diabetes mellitus without mention of complication, not stated as uncontrolled    Past Surgical History:  Procedure Laterality Date  . BREAST EXCISIONAL BIOPSY Left 03/19/2012   neg  . CESAREAN SECTION    . CHOLECYSTECTOMY  1992  . Newborn Surgery - GI - ORGANS OUTSIDE ABDOMEN    . Small Bowel Repair    . TUBAL LIGATION  1988   Social History   Socioeconomic History  . Marital status: Married    Spouse name: Not on file  . Number of children: Not on file  . Years of education: Not on file  . Highest education level: Not on file  Occupational History  . 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 Financial  resource strain: Not on file  . Food insecurity:    Worry: Not on file    Inability: Not on file  . Transportation needs:    Medical: Not on file    Non-medical: Not on file  Tobacco Use  . Smoking status: Never Smoker  . Smokeless tobacco: Never Used  . Tobacco comment: "tried as a teen"  Substance and Sexual Activity  . Alcohol use: Yes    Alcohol/week: 1.2 oz    Types: 2 Glasses of wine per week    Comment: rare  . Drug use: No  . Sexual activity: Yes    Birth control/protection: Surgical  Lifestyle  . Physical activity:    Days per week: 2 days    Minutes per session: 30 min  . Stress: Not on file  Relationships  . Social connections:    Talks on phone: Not on file    Gets together: Not on file    Attends religious service: Not on file    Active member of club or organization: Not on file    Attends meetings of clubs or organizations: Not on file    Relationship status: Not on file  Other Topics Concern  . Not  on file  Social History Narrative   Regular Exercise -  YES   Daily Caffeine Use:  2   Active and independent at baseline      Allergies  Allergen Reactions  . Factive [Gemifloxacin Mesylate] Rash  . Crestor [Rosuvastatin]     GI upset  . Sulfonamide Derivatives     REACTION: rash  . Gemifloxacin Rash  . Penicillins Hives and Rash    Has patient had a PCN reaction causing immediate rash, facial/tongue/throat swelling, SOB or lightheadedness with hypotension: No Has patient had a PCN reaction causing severe rash involving mucus membranes or skin necrosis: No Has patient had a PCN reaction that required hospitalization No Has patient had a PCN reaction occurring within the last 10 years: No If all of the above answers are "NO", then may proceed with Cephalosporin use.    Family History  Problem Relation Age of Onset  . Lymphoma Mother   . Diabetes type II Father   . Colon cancer Father   . Diabetes type I Son   . Diabetes Maternal Grandmother   .  Goiter Neg Hx   . Breast cancer Neg Hx   . Ovarian cancer Neg Hx      Past medical history, social, surgical and family history all reviewed in electronic medical record.  No pertanent information unless stated regarding to the chief complaint.   Review of Systems:Review of systems updated and as accurate as of 07/24/17  No headache, visual changes, nausea, vomiting, diarrhea, constipation, dizziness, abdominal pain, skin rash, fevers, chills, night sweats, weight loss, swollen lymph nodes, body aches, joint swelling, chest pain, shortness of breath, mood changes.  Positive muscle aches  Objective  Blood pressure 120/78, pulse 69, height 5' 7"  (1.702 m), weight 221 lb (100.2 kg), SpO2 97 %. Systems examined below as of 07/24/17   General: No apparent distress alert and oriented x3 mood and affect normal, dressed appropriately.  HEENT: Pupils equal, extraocular movements intact  Respiratory: Patient's speak in full sentences and does not appear short of breath  Cardiovascular: No lower extremity edema, non tender, no erythema  Skin: Warm dry intact with no signs of infection or rash on extremities or on axial skeleton.  Abdomen: Soft nontender  Neuro: Cranial nerves II through XII are intact, neurovascularly intact in all extremities with 2+ DTRs and 2+ pulses.  Lymph: No lymphadenopathy of posterior or anterior cervical chain or axillae bilaterally.  Gait normal with good balance and coordination.  MSK:  Non tender with full range of motion and good stability and symmetric strength and tone of  elbows, wrist, hip, knee and ankles bilaterally.  Shoulder: Right Inspection reveals no abnormalities, atrophy or asymmetry. Palpation is normal with no tenderness over AC joint or bicipital groove. ROM is full in all planes passively. Rotator cuff strength normal throughout. signs of impingement with positive Neer and Hawkin's tests, but negative empty can sign. Speeds and Yergason's tests  normal. No labral pathology noted with negative Obrien's, negative clunk and good stability. Normal scapular function observed. No painful arc and no drop arm sign. No apprehension sign Contralateral shoulder unremarkable   Procedure: Real-time Ultrasound Guided Injection of right glenohumeral joint Device: GE Logiq E  Ultrasound guided injection is preferred based studies that show increased duration, increased effect, greater accuracy, decreased procedural pain, increased response rate with ultrasound guided versus blind injection.  Verbal informed consent obtained.  Time-out conducted.  Noted no overlying erythema, induration, or other signs of local infection.  Skin prepped in a sterile fashion.  Local anesthesia: Topical Ethyl chloride.  With sterile technique and under real time ultrasound guidance:  Joint visualized.  23g 1  inch needle inserted posterior approach. Pictures taken for needle placement. Patient did have injection of 2 cc of 1% lidocaine, 2 cc of 0.5% Marcaine, and 1.0 cc of Kenalog 40 mg/dL. Completed without difficulty  Pain immediately resolved suggesting accurate placement of the medication.  Advised to call if fevers/chills, erythema, induration, drainage, or persistent bleeding.  Images permanently stored and available for review in the ultrasound unit.  Impression: Technically successful ultrasound guided injection.     Impression and Recommendations:     This case required medical decision making of moderate complexity.      Note: This dictation was prepared with Dragon dictation along with smaller phrase technology. Any transcriptional errors that result from this process are unintentional.

## 2017-07-24 NOTE — Assessment & Plan Note (Signed)
Right shoulder pain.  Discussed icing regimen and home exercise.  Discussed which activities to do which wants to avoid.  Has responded very well to injections previously return to clinic again in 4 weeks

## 2017-07-24 NOTE — Patient Instructions (Addendum)
Good to see you  Ice 20 minutes 2 times daily. Usually after activity and before bed. Exercises 3 times a week.  pennsaid pinkie amount topically 2 times daily as needed.  Once weekly vitamin D for 12 weeks \xray downstairs today  See me again in 4 weeks

## 2017-08-19 ENCOUNTER — Other Ambulatory Visit (INDEPENDENT_AMBULATORY_CARE_PROVIDER_SITE_OTHER): Payer: PPO

## 2017-08-19 ENCOUNTER — Telehealth: Payer: Self-pay | Admitting: Obstetrics and Gynecology

## 2017-08-19 DIAGNOSIS — R300 Dysuria: Secondary | ICD-10-CM

## 2017-08-19 LAB — POCT URINALYSIS DIPSTICK
Glucose, UA: NEGATIVE
Ketones, UA: NEGATIVE
NITRITE UA: NEGATIVE
ODOR: NEGATIVE
PH UA: 6 (ref 5.0–8.0)
PROTEIN UA: POSITIVE — AB
Spec Grav, UA: 1.02 (ref 1.010–1.025)
UROBILINOGEN UA: 0.2 U/dL

## 2017-08-19 MED ORDER — NITROFURANTOIN MONOHYD MACRO 100 MG PO CAPS: 100.0000 mg | ORAL_CAPSULE | Freq: Two times a day (BID) | ORAL | 0 refills | Status: DC

## 2017-08-19 MED ORDER — UROGESIC-BLUE 81.6 MG PO TABS: 81.6000 mg | ORAL_TABLET | Freq: Four times a day (QID) | ORAL | 1 refills | Status: DC

## 2017-08-19 NOTE — Telephone Encounter (Signed)
Spoke with pt- u/a pos for blood and leuks. No fevers or flank pain. Will erx macorbid. Urogesic per pt request. Culture sent.

## 2017-08-19 NOTE — Telephone Encounter (Signed)
The patient called and stated that she would like to check on the status of her urine drop off from this morning. Please advise.

## 2017-08-22 ENCOUNTER — Telehealth: Payer: Self-pay

## 2017-08-22 ENCOUNTER — Encounter: Payer: Self-pay | Admitting: Obstetrics and Gynecology

## 2017-08-22 ENCOUNTER — Other Ambulatory Visit: Payer: Self-pay | Admitting: Obstetrics and Gynecology

## 2017-08-22 LAB — URINE CULTURE

## 2017-08-22 MED ORDER — CIPROFLOXACIN HCL 500 MG PO TABS: 500.0000 mg | ORAL_TABLET | Freq: Two times a day (BID) | ORAL | 0 refills | Status: DC

## 2017-08-22 NOTE — Telephone Encounter (Signed)
Pt aware to d/c macrobid. Pt is ok to have cipro. erx cipro 500 bid x 7.

## 2017-08-22 NOTE — Telephone Encounter (Signed)
-----   Message from [name], MD sent at 08/22/2017  3:31 PM EDT ----- Please notify - Abnormal Labs Please check with patient's ability to take Cipro, Septra, and Levaquin. Patient's UTI is not sensitive to [employer]

## 2017-08-29 DIAGNOSIS — G4733 Obstructive sleep apnea (adult) (pediatric): Secondary | ICD-10-CM | POA: Diagnosis not present

## 2017-09-04 ENCOUNTER — Other Ambulatory Visit (INDEPENDENT_AMBULATORY_CARE_PROVIDER_SITE_OTHER): Payer: PPO

## 2017-09-04 ENCOUNTER — Encounter: Payer: Self-pay | Admitting: Internal Medicine

## 2017-09-04 ENCOUNTER — Ambulatory Visit (INDEPENDENT_AMBULATORY_CARE_PROVIDER_SITE_OTHER): Payer: PPO | Admitting: Internal Medicine

## 2017-09-04 VITALS — BP 110/70 | HR 78 | Temp 98.1°F | Ht 67.0 in | Wt 222.5 lb

## 2017-09-04 DIAGNOSIS — E876 Hypokalemia: Secondary | ICD-10-CM | POA: Diagnosis not present

## 2017-09-04 DIAGNOSIS — E781 Pure hyperglyceridemia: Secondary | ICD-10-CM

## 2017-09-04 DIAGNOSIS — Z1211 Encounter for screening for malignant neoplasm of colon: Secondary | ICD-10-CM

## 2017-09-04 DIAGNOSIS — E118 Type 2 diabetes mellitus with unspecified complications: Secondary | ICD-10-CM

## 2017-09-04 DIAGNOSIS — T502X5A Adverse effect of carbonic-anhydrase inhibitors, benzothiadiazides and other diuretics, initial encounter: Secondary | ICD-10-CM | POA: Diagnosis not present

## 2017-09-04 DIAGNOSIS — I10 Essential (primary) hypertension: Secondary | ICD-10-CM

## 2017-09-04 DIAGNOSIS — E538 Deficiency of other specified B group vitamins: Secondary | ICD-10-CM | POA: Diagnosis not present

## 2017-09-04 DIAGNOSIS — E785 Hyperlipidemia, unspecified: Secondary | ICD-10-CM | POA: Diagnosis not present

## 2017-09-04 DIAGNOSIS — E039 Hypothyroidism, unspecified: Secondary | ICD-10-CM

## 2017-09-04 DIAGNOSIS — Z1212 Encounter for screening for malignant neoplasm of rectum: Secondary | ICD-10-CM

## 2017-09-04 LAB — URINALYSIS, ROUTINE W REFLEX MICROSCOPIC
BILIRUBIN URINE: NEGATIVE
HGB URINE DIPSTICK: NEGATIVE
Ketones, ur: NEGATIVE
LEUKOCYTES UA: NEGATIVE
NITRITE: NEGATIVE
PH: 5.5 (ref 5.0–8.0)
RBC / HPF: NONE SEEN (ref 0–?)
Specific Gravity, Urine: 1.02 (ref 1.000–1.030)
TOTAL PROTEIN, URINE-UPE24: NEGATIVE
URINE GLUCOSE: NEGATIVE
UROBILINOGEN UA: 0.2 (ref 0.0–1.0)

## 2017-09-04 LAB — BASIC METABOLIC PANEL
BUN: 13 mg/dL (ref 6–23)
CO2: 34 mEq/L — ABNORMAL HIGH (ref 19–32)
Calcium: 10 mg/dL (ref 8.4–10.5)
Chloride: 100 mEq/L (ref 96–112)
Creatinine, Ser: 0.72 mg/dL (ref 0.40–1.20)
GFR: 87.79 mL/min (ref >60.00)
GLUCOSE: 157 mg/dL — AB (ref 70–99)
POTASSIUM: 3.3 meq/L — AB (ref 3.5–5.1)
Sodium: 142 mEq/L (ref 135–145)

## 2017-09-04 LAB — MICROALBUMIN / CREATININE URINE RATIO
Creatinine,U: 79.5 mg/dL
MICROALB/CREAT RATIO: 0.9 mg/g (ref 0.0–30.0)
Microalb, Ur: <0.7 mg/dL (ref 0.0–1.9)

## 2017-09-04 LAB — HEMOGLOBIN A1C: HEMOGLOBIN A1C: 7.6 % — AB (ref 4.6–6.5)

## 2017-09-04 LAB — TRIGLYCERIDES: Triglycerides: 194 mg/dL — ABNORMAL HIGH (ref 0.0–149.0)

## 2017-09-04 LAB — TSH: TSH: 2.42 u[IU]/mL (ref 0.35–4.50)

## 2017-09-04 MED ORDER — CYANOCOBALAMIN 1000 MCG/ML IJ SOLN: 1000.0000 ug | INTRAMUSCULAR | Status: DC

## 2017-09-04 MED ORDER — CYANOCOBALAMIN 1000 MCG/ML IJ SOLN
1000.0000 ug | Freq: Once | INTRAMUSCULAR | Status: AC
Start: 2017-09-04 — End: 2017-09-04
  Administered 2017-09-04: 1000 ug via INTRAMUSCULAR

## 2017-09-04 NOTE — Patient Instructions (Signed)

## 2017-09-04 NOTE — Progress Notes (Signed)
Subjective:  Patient ID: Kirsten [name], female    DOB: 07-Feb-1956  Age: 62 y.o. MRN: [id number]  CC: Hypothyroidism; Hyperlipidemia; and Diabetes   HPI Kirsten [name] presents for f/up - she complains of wt gain, fatigue, and insomnia.  She denies any recent episodes of DOE, CP, palpitations, or fatigue.  She does not monitor her blood sugars but she denies polys.  Outpatient Medications Prior to Visit  Medication Sig Dispense Refill  . amiodarone (PACERONE) 200 MG tablet Take 200 mg daily by mouth.    Marland Kitchen apixaban (ELIQUIS) 5 MG TABS tablet Take 1 tablet (5 mg total) by mouth 2 (two) times daily. 180 tablet 1  . glucose blood (ONE TOUCH ULTRA TEST) test strip Use as instructed 100 each 12  . hydrochlorothiazide (HYDRODIURIL) 25 MG tablet Take 1 tablet (25 mg total) by mouth daily. 90 tablet 1  . levothyroxine (SYNTHROID, LEVOTHROID) 100 MCG tablet TAKE 1 TABLET BY MOUTH ONCE DAILY 90 tablet 1  . metFORMIN (GLUCOPHAGE) 1000 MG tablet Take 1 tablet (1,000 mg total) by mouth 2 (two) times daily with a meal. Follow-up appt due in July must see provider for future refills 180 tablet 0  . metoprolol succinate (TOPROL-XL) 50 MG 24 hr tablet Take 50 mg by mouth daily.     Marland Kitchen PARoxetine (PAXIL-CR) 25 MG 24 hr tablet Take 1 tablet (25 mg total) by mouth daily. 90 tablet 1  . zolpidem (AMBIEN) 5 MG tablet Take 1 tablet (5 mg total) by mouth at bedtime as needed for sleep. 90 tablet 1  . ciprofloxacin (CIPRO) 500 MG tablet Take 1 tablet (500 mg total) by mouth 2 (two) times daily. 14 tablet 0  . Methen-Hyosc-Meth Blue-Na Phos (UROGESIC-BLUE) 81.6 MG TABS Take 1 tablet (81.6 mg total) by mouth every 6 (six) hours. 40 tablet 1  . nitrofurantoin, macrocrystal-monohydrate, (MACROBID) 100 MG capsule Take 1 capsule (100 mg total) by mouth 2 (two) times daily. 14 capsule 0  . Vitamin D, Ergocalciferol, (DRISDOL) 50000 units CAPS capsule Take 1 capsule (50,000 Units total) by mouth every 7 (seven) days. 12  capsule 0   No facility-administered medications prior to visit.     ROS Review of Systems  Constitutional: Positive for fatigue and unexpected weight change. Negative for appetite change and diaphoresis.  HENT: Negative.   Eyes: Negative.   Respiratory: Negative for cough, chest tightness, shortness of breath and wheezing.   Cardiovascular: Negative for chest pain, palpitations and leg swelling.  Gastrointestinal: Negative for abdominal pain, constipation, diarrhea, nausea and vomiting.  Endocrine: Negative.  Negative for cold intolerance, heat intolerance, polydipsia, polyphagia and polyuria.  Genitourinary: Negative.  Negative for decreased urine volume, difficulty urinating, dysuria, hematuria and urgency.  Musculoskeletal: Negative.  Negative for arthralgias and myalgias.  Skin: Negative for color change, pallor and rash.  Neurological: Negative.  Negative for dizziness, weakness and light-headedness.  Hematological: Negative for adenopathy. Does not bruise/bleed easily.  Psychiatric/Behavioral: Positive for sleep disturbance. Negative for confusion, dysphoric mood and suicidal ideas. The patient is not nervous/anxious.     Objective:  BP 110/70 (BP Location: Left Arm, Patient Position: Sitting, Cuff Size: Large)   Pulse 78   Temp 98.1 F (36.7 C) (Oral)   Ht 5' 7"  (1.702 m)   Wt 222 lb 8 oz (100.9 kg)   SpO2 93%   BMI 34.85 kg/m   BP Readings from Last 3 Encounters:  09/04/17 110/70  07/24/17 120/78  05/30/17 130/68    Wt Readings  from Last 3 Encounters:  09/04/17 222 lb 8 oz (100.9 kg)  07/24/17 221 lb (100.2 kg)  05/30/17 217 lb (98.4 kg)    Physical Exam  Constitutional: She is oriented to person, place, and time. No distress.  HENT:  Mouth/Throat: Oropharynx is clear and moist. No oropharyngeal exudate.  Eyes: Conjunctivae are normal. No scleral icterus.  Neck: Normal range of motion. Neck supple. No JVD present. No thyromegaly present.  Cardiovascular:  Normal rate, regular rhythm and normal heart sounds. Exam reveals no gallop.  No murmur heard. Pulmonary/Chest: Effort normal and breath sounds normal. No respiratory distress. She has no wheezes. She has no rhonchi. She has no rales.  Abdominal: Soft. Normal appearance and bowel sounds are normal. There is no hepatosplenomegaly. There is no tenderness.  Musculoskeletal: Normal range of motion. She exhibits no edema, tenderness or deformity.  Lymphadenopathy:    She has no cervical adenopathy.  Neurological: She is alert and oriented to person, place, and time.  Skin: Skin is warm and dry. She is not diaphoretic. No pallor.  Psychiatric: She has a normal mood and affect. Her behavior is normal. Judgment and thought content normal.  Vitals reviewed.   Lab Results  Component Value Date   WBC 10.3 04/23/2017   HGB 14.7 04/23/2017   HCT 42.4 04/23/2017   PLT 318 04/23/2017   GLUCOSE 157 (H) 09/04/2017   CHOL 189 03/21/2017   TRIG 194.0 (H) 09/04/2017   HDL 43.60 03/21/2017   LDLDIRECT 119.0 03/21/2017   LDLCALC SEE COMMENT 11/26/2013   ALT 28 09/10/2016   AST 22 09/10/2016   NA 142 09/04/2017   K 3.3 (L) 09/04/2017   CL 100 09/04/2017   CREATININE 0.72 09/04/2017   BUN 13 09/04/2017   CO2 34 (H) 09/04/2017   TSH 2.42 09/04/2017   INR 1.1 11/27/2013   HGBA1C 7.6 (H) 09/04/2017   MICROALBUR <0.7 09/04/2017    Dg Shoulder Right  Result Date: 07/24/2017 CLINICAL DATA:  Shoulder pain for several weeks, no known injury, initial encounter EXAM: RIGHT SHOULDER - 2+ VIEW COMPARISON:  None. FINDINGS: Mild degenerative changes of the acromioclavicular joint are seen. No acute fracture or dislocation is noted. No soft tissue abnormality is noted. IMPRESSION: Degenerative changes of the acromioclavicular joint without acute abnormality. Electronically Signed   By: Inez Catalina M.D.   On: 07/24/2017 14:02   [place] Limited Joint Space Structures Up Right  Result Date: 07/29/2017 Procedure:  Real-time Ultrasound Guided Injection of right glenohumeral joint Device: GE Logiq E Ultrasound guided injection is preferred based studies that show increased duration, increased effect, greater accuracy, decreased procedural pain, increased response rate with ultrasound guided versus blind injection. Verbal informed consent obtained. Time-out conducted. Noted no overlying erythema, induration, or other signs of local infection. Skin prepped in a sterile fashion. Local anesthesia: Topical Ethyl chloride. With sterile technique and under real time ultrasound guidance: Joint visualized. 23g 1  inch needle inserted posterior approach. Pictures taken for needle placement. Patient did have injection of 2 cc of 1% lidocaine, 2 cc of 0.5% Marcaine, and 1.0 cc of Kenalog 40 mg/dL. Completed without difficulty Pain immediately resolved suggesting accurate placement of the medication. Advised to call if fevers/chills, erythema, induration, drainage, or persistent bleeding. Images permanently stored and available for review in the ultrasound unit. Impression: Technically successful ultrasound guided injection.    Assessment & Plan:   Kirsten [name] was seen today for hypothyroidism, hyperlipidemia and diabetes.  Diagnoses and all orders for this visit:  Acquired hypothyroidism- Her TSH is in the normal range.  She will remain on the current dose of levothyroxine. -     TSH; Future  Type 2 diabetes mellitus with complication, without long-term current use of insulin ([place])- Her A1c is up to 7.6%.  She does not want to add another medication at this time.  She agrees to improve her lifestyle modifications. -     Basic metabolic panel; Future -     Urinalysis, Routine w reflex microscopic; Future -     Hemoglobin A1c; Future -     Microalbumin / creatinine urine ratio; Future  Essential hypertension, benign- Her blood pressure is adequately well controlled.  She has developed hypokalemia.  Will treat this. -     Basic  metabolic panel; Future -     Urinalysis, Routine w reflex microscopic; Future  Hypertriglyceridemia- Improvement noted.  Her triglycerides are down to 194.  She will continue to work on her lifestyle modifications. -     Triglycerides; Future  Vitamin B 12 deficiency -     cyanocobalamin ((VITAMIN B-12)) injection 1,000 mcg -     cyanocobalamin ((VITAMIN B-12)) injection 1,000 mcg  Colon cancer screening -     Ambulatory referral to Gastroenterology  Screening for malignant neoplasm of the rectum -     Ambulatory referral to Gastroenterology  Hyperlipidemia with target LDL less than 100 -     Discontinue: atorvastatin (LIPITOR) 40 MG tablet; Take 1 tablet (40 mg total) by mouth daily.  Diuretic-induced hypokalemia -     potassium chloride SA (K-DUR,KLOR-CON) 20 MEQ tablet; Take 1 tablet (20 mEq total) by mouth 3 (three) times daily.   I have discontinued Keyira A. Parenteau's Vitamin D (Ergocalciferol), nitrofurantoin (macrocrystal-monohydrate), UROGESIC-BLUE, and ciprofloxacin. I am also having her start on potassium chloride SA. Additionally, I am having her maintain her glucose blood, metoprolol succinate, apixaban, amiodarone, PARoxetine, hydrochlorothiazide, zolpidem, metFORMIN, and levothyroxine. We administered cyanocobalamin. We will continue to administer cyanocobalamin.  Meds ordered this encounter  Medications  . cyanocobalamin ((VITAMIN B-12)) injection 1,000 mcg  . cyanocobalamin ((VITAMIN B-12)) injection 1,000 mcg  . potassium chloride SA (K-DUR,KLOR-CON) 20 MEQ tablet    Sig: Take 1 tablet (20 mEq total) by mouth 3 (three) times daily.    Dispense:  270 tablet    Refill:  1  . DISCONTD: atorvastatin (LIPITOR) 40 MG tablet    Sig: Take 1 tablet (40 mg total) by mouth daily.    Dispense:  90 tablet    Refill:  1     Follow-up: Return in about 6 months (around 03/07/2018).  [name], MD

## 2017-09-05 ENCOUNTER — Encounter: Payer: Self-pay | Admitting: Internal Medicine

## 2017-09-05 DIAGNOSIS — E785 Hyperlipidemia, unspecified: Secondary | ICD-10-CM

## 2017-09-05 MED ORDER — ATORVASTATIN CALCIUM 40 MG PO TABS: 40.0000 mg | ORAL_TABLET | Freq: Every day | ORAL | 1 refills | Status: DC

## 2017-09-05 MED ORDER — POTASSIUM CHLORIDE CRYS ER 20 MEQ PO TBCR: 20.0000 meq | EXTENDED_RELEASE_TABLET | Freq: Three times a day (TID) | ORAL | 1 refills | Status: DC

## 2017-09-14 ENCOUNTER — Other Ambulatory Visit: Payer: Self-pay | Admitting: Internal Medicine

## 2017-10-04 ENCOUNTER — Ambulatory Visit (INDEPENDENT_AMBULATORY_CARE_PROVIDER_SITE_OTHER): Payer: PPO

## 2017-10-04 DIAGNOSIS — E538 Deficiency of other specified B group vitamins: Secondary | ICD-10-CM

## 2017-10-04 MED ORDER — CYANOCOBALAMIN 1000 MCG/ML IJ SOLN
1000.0000 ug | Freq: Once | INTRAMUSCULAR | Status: AC
  Administered 2017-10-04: 1000 ug via INTRAMUSCULAR

## 2017-10-29 DIAGNOSIS — R0602 Shortness of breath: Secondary | ICD-10-CM | POA: Diagnosis not present

## 2017-10-29 DIAGNOSIS — I4891 Unspecified atrial fibrillation: Secondary | ICD-10-CM | POA: Diagnosis not present

## 2017-10-29 DIAGNOSIS — I10 Essential (primary) hypertension: Secondary | ICD-10-CM | POA: Diagnosis not present

## 2017-10-29 DIAGNOSIS — I2510 Atherosclerotic heart disease of native coronary artery without angina pectoris: Secondary | ICD-10-CM | POA: Diagnosis not present

## 2017-11-05 ENCOUNTER — Ambulatory Visit: Payer: PPO

## 2017-12-05 DIAGNOSIS — G4733 Obstructive sleep apnea (adult) (pediatric): Secondary | ICD-10-CM | POA: Diagnosis not present

## 2017-12-06 DIAGNOSIS — R51 Headache: Secondary | ICD-10-CM | POA: Diagnosis not present

## 2017-12-06 DIAGNOSIS — M9903 Segmental and somatic dysfunction of lumbar region: Secondary | ICD-10-CM | POA: Diagnosis not present

## 2017-12-06 DIAGNOSIS — M9901 Segmental and somatic dysfunction of cervical region: Secondary | ICD-10-CM | POA: Diagnosis not present

## 2017-12-06 DIAGNOSIS — M5010 Cervical disc disorder with radiculopathy, unspecified cervical region: Secondary | ICD-10-CM | POA: Diagnosis not present

## 2017-12-09 DIAGNOSIS — M9903 Segmental and somatic dysfunction of lumbar region: Secondary | ICD-10-CM | POA: Diagnosis not present

## 2017-12-09 DIAGNOSIS — R51 Headache: Secondary | ICD-10-CM | POA: Diagnosis not present

## 2017-12-09 DIAGNOSIS — M9901 Segmental and somatic dysfunction of cervical region: Secondary | ICD-10-CM | POA: Diagnosis not present

## 2017-12-09 DIAGNOSIS — M5010 Cervical disc disorder with radiculopathy, unspecified cervical region: Secondary | ICD-10-CM | POA: Diagnosis not present

## 2017-12-10 DIAGNOSIS — M9903 Segmental and somatic dysfunction of lumbar region: Secondary | ICD-10-CM | POA: Diagnosis not present

## 2017-12-10 DIAGNOSIS — M9901 Segmental and somatic dysfunction of cervical region: Secondary | ICD-10-CM | POA: Diagnosis not present

## 2017-12-10 DIAGNOSIS — M5010 Cervical disc disorder with radiculopathy, unspecified cervical region: Secondary | ICD-10-CM | POA: Diagnosis not present

## 2017-12-10 DIAGNOSIS — R51 Headache: Secondary | ICD-10-CM | POA: Diagnosis not present

## 2017-12-18 ENCOUNTER — Other Ambulatory Visit: Payer: Self-pay | Admitting: Internal Medicine

## 2017-12-18 DIAGNOSIS — E038 Other specified hypothyroidism: Secondary | ICD-10-CM

## 2017-12-25 ENCOUNTER — Ambulatory Visit (INDEPENDENT_AMBULATORY_CARE_PROVIDER_SITE_OTHER): Payer: PPO | Admitting: Internal Medicine

## 2017-12-25 ENCOUNTER — Other Ambulatory Visit (INDEPENDENT_AMBULATORY_CARE_PROVIDER_SITE_OTHER): Payer: PPO

## 2017-12-25 ENCOUNTER — Encounter: Payer: Self-pay | Admitting: Internal Medicine

## 2017-12-25 VITALS — BP 136/80 | HR 73 | Temp 98.4°F | Ht 67.0 in | Wt 218.0 lb

## 2017-12-25 DIAGNOSIS — F418 Other specified anxiety disorders: Secondary | ICD-10-CM | POA: Diagnosis not present

## 2017-12-25 DIAGNOSIS — R0609 Other forms of dyspnea: Secondary | ICD-10-CM

## 2017-12-25 DIAGNOSIS — E2839 Other primary ovarian failure: Secondary | ICD-10-CM | POA: Diagnosis not present

## 2017-12-25 DIAGNOSIS — T502X5A Adverse effect of carbonic-anhydrase inhibitors, benzothiadiazides and other diuretics, initial encounter: Secondary | ICD-10-CM | POA: Diagnosis not present

## 2017-12-25 DIAGNOSIS — I10 Essential (primary) hypertension: Secondary | ICD-10-CM

## 2017-12-25 DIAGNOSIS — E876 Hypokalemia: Secondary | ICD-10-CM

## 2017-12-25 DIAGNOSIS — Z23 Encounter for immunization: Secondary | ICD-10-CM

## 2017-12-25 DIAGNOSIS — R0600 Dyspnea, unspecified: Secondary | ICD-10-CM

## 2017-12-25 DIAGNOSIS — Z1211 Encounter for screening for malignant neoplasm of colon: Secondary | ICD-10-CM

## 2017-12-25 DIAGNOSIS — E032 Hypothyroidism due to medicaments and other exogenous substances: Secondary | ICD-10-CM | POA: Diagnosis not present

## 2017-12-25 DIAGNOSIS — E118 Type 2 diabetes mellitus with unspecified complications: Secondary | ICD-10-CM | POA: Diagnosis not present

## 2017-12-25 DIAGNOSIS — Z1231 Encounter for screening mammogram for malignant neoplasm of breast: Secondary | ICD-10-CM

## 2017-12-25 DIAGNOSIS — E538 Deficiency of other specified B group vitamins: Secondary | ICD-10-CM

## 2017-12-25 LAB — BASIC METABOLIC PANEL
BUN: 14 mg/dL (ref 6–23)
CHLORIDE: 100 meq/L (ref 96–112)
CO2: 35 mEq/L — ABNORMAL HIGH (ref 19–32)
CREATININE: 0.74 mg/dL (ref 0.40–1.20)
Calcium: 10.5 mg/dL (ref 8.4–10.5)
GFR: 84.97 mL/min (ref >60.00)
Glucose, Bld: 144 mg/dL — ABNORMAL HIGH (ref 70–99)
Potassium: 4.1 mEq/L (ref 3.5–5.1)
Sodium: 143 mEq/L (ref 135–145)

## 2017-12-25 LAB — CBC WITH DIFFERENTIAL/PLATELET
BASOS ABS: 0.1 10*3/uL (ref 0.0–0.1)
Basophils Relative: 0.9 % (ref 0.0–3.0)
Eosinophils Absolute: 0.2 10*3/uL (ref 0.0–0.7)
Eosinophils Relative: 2.8 % (ref 0.0–5.0)
HCT: 43.6 % (ref 36.0–46.0)
Hemoglobin: 14.4 g/dL (ref 12.0–15.0)
LYMPHS ABS: 2.8 10*3/uL (ref 0.7–4.0)
Lymphocytes Relative: 33.1 % (ref 12.0–46.0)
MCHC: 33.1 g/dL (ref 30.0–36.0)
MCV: 89.3 fl (ref 78.0–100.0)
MONO ABS: 0.5 10*3/uL (ref 0.1–1.0)
Monocytes Relative: 6.3 % (ref 3.0–12.0)
NEUTROS PCT: 56.9 % (ref 43.0–77.0)
Neutro Abs: 4.8 10*3/uL (ref 1.4–7.7)
Platelets: 317 10*3/uL (ref 150.0–400.0)
RBC: 4.88 Mil/uL (ref 3.87–5.11)
RDW: 13.9 % (ref 11.5–15.5)
WBC: 8.4 10*3/uL (ref 4.0–10.5)

## 2017-12-25 LAB — HEMOGLOBIN A1C: HEMOGLOBIN A1C: 7.2 % — AB (ref 4.6–6.5)

## 2017-12-25 LAB — HM DIABETES EYE EXAM

## 2017-12-25 LAB — BRAIN NATRIURETIC PEPTIDE: PRO B NATRI PEPTIDE: 30 pg/mL (ref 0.0–100.0)

## 2017-12-25 LAB — TSH: TSH: 2.65 u[IU]/mL (ref 0.35–4.50)

## 2017-12-25 LAB — TROPONIN I: TNIDX: 0.01 ug/L (ref 0.00–0.06)

## 2017-12-25 MED ORDER — PAROXETINE HCL ER 12.5 MG PO TB24: 12.5000 mg | ORAL_TABLET | Freq: Every day | ORAL | 0 refills | Status: DC

## 2017-12-25 MED ORDER — CYANOCOBALAMIN 1000 MCG/ML IJ SOLN
1000.0000 ug | Freq: Once | INTRAMUSCULAR | Status: AC
  Administered 2017-12-25: 1000 ug via INTRAMUSCULAR

## 2017-12-25 NOTE — Patient Instructions (Signed)

## 2017-12-25 NOTE — Progress Notes (Signed)
Subjective:  Patient ID: Kirsten [name], female    DOB: [birth year]  Age: 62 y.o. MRN: [id number]  CC: Hypothyroidism; Hypertension; and Diabetes   HPI Kirsten [name] presents for f/up -She complains of a several week history of dyspnea on exertion.  She has had intermittent symptoms like this for the last year.  She tells me she saw her cardiologist about 4 months ago and it sounds like she had a myocardial perfusion imaging done that was negative for ischemia.  She denies chest pain, diaphoresis, dizziness, lightheadedness, edema, or near syncope.  She wants to start tapering her Paxil dose.  Outpatient Medications Prior to Visit  Medication Sig Dispense Refill  . amiodarone (PACERONE) 200 MG tablet Take 200 mg daily by mouth.    Marland Kitchen apixaban (ELIQUIS) 5 MG TABS tablet Take 1 tablet (5 mg total) by mouth 2 (two) times daily. 180 tablet 1  . atorvastatin (LIPITOR) 40 MG tablet Take 1 tablet (40 mg total) by mouth daily. 90 tablet 1  . glucose blood (ONE TOUCH ULTRA TEST) test strip Use as instructed 100 each 12  . hydrochlorothiazide (HYDRODIURIL) 25 MG tablet Take 1 tablet (25 mg total) by mouth daily. 90 tablet 1  . levothyroxine (SYNTHROID, LEVOTHROID) 100 MCG tablet Take 1 tablet (100 mcg total) by mouth daily. 90 tablet 0  . metFORMIN (GLUCOPHAGE) 1000 MG tablet Take 1 tablet (1,000 mg total) by mouth 2 (two) times daily with a meal. Follow-up appt due in July must see provider for future refills 180 tablet 0  . metoprolol succinate (TOPROL-XL) 50 MG 24 hr tablet Take 50 mg by mouth daily.     . potassium chloride SA (K-DUR,KLOR-CON) 20 MEQ tablet Take 1 tablet (20 mEq total) by mouth 3 (three) times daily. 270 tablet 1  . zolpidem (AMBIEN) 5 MG tablet Take 1 tablet (5 mg total) by mouth at bedtime as needed for sleep. 90 tablet 1  . PARoxetine (PAXIL-CR) 25 MG 24 hr tablet TAKE 1 TABLET(25 MG) BY MOUTH DAILY 90 tablet 1   Facility-Administered Medications Prior to Visit  Medication  Dose Route Frequency Provider Last Rate Last Dose  . cyanocobalamin ((VITAMIN B-12)) injection 1,000 mcg  1,000 mcg Intramuscular Q30 days [name], MD        ROS Review of Systems  Constitutional: Negative for appetite change, diaphoresis, fatigue and unexpected weight change.  HENT: Negative.   Eyes: Negative for visual disturbance.  Respiratory: Positive for shortness of breath (DOE). Negative for cough and chest tightness.   Cardiovascular: Negative for chest pain, palpitations and leg swelling.  Gastrointestinal: Negative for abdominal pain, blood in stool, constipation, diarrhea, nausea and vomiting.  Endocrine: Negative.   Genitourinary: Negative.  Negative for difficulty urinating, dysuria and frequency.  Musculoskeletal: Negative for arthralgias, back pain and myalgias.  Neurological: Negative.  Negative for dizziness, weakness, light-headedness and headaches.  Hematological: Negative for adenopathy. Does not bruise/bleed easily.  Psychiatric/Behavioral: Positive for sleep disturbance. Negative for behavioral problems, confusion, dysphoric mood, self-injury and suicidal ideas. The patient is not nervous/anxious.     Objective:  BP 136/80 (BP Location: Left Arm, Patient Position: Sitting, Cuff Size: Large)   Pulse 73   Temp 98.4 F (36.9 C) (Oral)   Ht 5' 7"  (1.702 m)   Wt 218 lb (98.9 kg)   SpO2 93%   BMI 34.14 kg/m   BP Readings from Last 3 Encounters:  12/25/17 136/80  09/04/17 110/70  07/24/17 120/78    Wt  Readings from Last 3 Encounters:  12/25/17 218 lb (98.9 kg)  09/04/17 222 lb 8 oz (100.9 kg)  07/24/17 221 lb (100.2 kg)    Physical Exam  Constitutional: She is oriented to person, place, and time. No distress.  HENT:  Mouth/Throat: Oropharynx is clear and moist. No oropharyngeal exudate.  Eyes: Conjunctivae are normal. No scleral icterus.  Neck: Normal range of motion. Neck supple. No JVD present. No thyromegaly present.  Cardiovascular: Normal  rate, regular rhythm and normal heart sounds. Exam reveals no gallop and no friction rub.  No murmur heard. EKG ----  Sinus  Rhythm  -Nonspecific QRS widening.   -  Nonspecific T-abnormality.   ABNORMAL   Pulmonary/Chest: Effort normal and breath sounds normal. No respiratory distress. She has no wheezes. She has no rales.  Abdominal: Soft. Normal appearance and bowel sounds are normal. She exhibits no mass. There is no hepatosplenomegaly. There is no tenderness.  Musculoskeletal: Normal range of motion. She exhibits no edema, tenderness or deformity.  Lymphadenopathy:    She has no cervical adenopathy.  Neurological: She is alert and oriented to person, place, and time.  Skin: Skin is warm and dry. She is not diaphoretic. No pallor.  Psychiatric: She has a normal mood and affect. Her behavior is normal. Judgment and thought content normal.  Vitals reviewed.   Lab Results  Component Value Date   WBC 8.4 12/25/2017   HGB 14.4 12/25/2017   HCT 43.6 12/25/2017   PLT 317.0 12/25/2017   GLUCOSE 144 (H) 12/25/2017   CHOL 189 03/21/2017   TRIG 194.0 (H) 09/04/2017   HDL 43.60 03/21/2017   LDLDIRECT 119.0 03/21/2017   LDLCALC SEE COMMENT 11/26/2013   ALT 28 09/10/2016   AST 22 09/10/2016   NA 143 12/25/2017   K 4.1 12/25/2017   CL 100 12/25/2017   CREATININE 0.74 12/25/2017   BUN 14 12/25/2017   CO2 35 (H) 12/25/2017   TSH 2.65 12/25/2017   INR 1.1 11/27/2013   HGBA1C 7.2 (H) 12/25/2017   MICROALBUR <0.7 09/04/2017    Dg Shoulder Right  Result Date: 07/24/2017 CLINICAL DATA:  Shoulder pain for several weeks, no known injury, initial encounter EXAM: RIGHT SHOULDER - 2+ VIEW COMPARISON:  None. FINDINGS: Mild degenerative changes of the acromioclavicular joint are seen. No acute fracture or dislocation is noted. No soft tissue abnormality is noted. IMPRESSION: Degenerative changes of the acromioclavicular joint without acute abnormality. Electronically Signed   By: Inez Catalina  M.D.   On: 07/24/2017 14:02   [place] Limited Joint Space Structures Up Right  Result Date: 07/29/2017 Procedure: Real-time Ultrasound Guided Injection of right glenohumeral joint Device: GE Logiq E Ultrasound guided injection is preferred based studies that show increased duration, increased effect, greater accuracy, decreased procedural pain, increased response rate with ultrasound guided versus blind injection. Verbal informed consent obtained. Time-out conducted. Noted no overlying erythema, induration, or other signs of local infection. Skin prepped in a sterile fashion. Local anesthesia: Topical Ethyl chloride. With sterile technique and under real time ultrasound guidance: Joint visualized. 23g 1  inch needle inserted posterior approach. Pictures taken for needle placement. Patient did have injection of 2 cc of 1% lidocaine, 2 cc of 0.5% Marcaine, and 1.0 cc of Kenalog 40 mg/dL. Completed without difficulty Pain immediately resolved suggesting accurate placement of the medication. Advised to call if fevers/chills, erythema, induration, drainage, or persistent bleeding. Images permanently stored and available for review in the ultrasound unit. Impression: Technically successful ultrasound guided injection.  Assessment & Plan:   Berenis was seen today for hypothyroidism, hypertension and diabetes.  Diagnoses and all orders for this visit:  Vitamin B 12 deficiency -     cyanocobalamin ((VITAMIN B-12)) injection 1,000 mcg -     CBC with Differential/Platelet; Future  Need for influenza vaccination -     Flu Vaccine QUAD 36+ mos IM  Essential hypertension, benign- Her blood pressure is well controlled.  Electrolytes and renal function are normal. -     Basic metabolic panel; Future  Diuretic-induced hypokalemia -     Basic metabolic panel; Future  Depression with anxiety -     PARoxetine (PAXIL-CR) 12.5 MG 24 hr tablet; Take 1 tablet (12.5 mg total) by mouth daily.  Dyspnea on exertion-  Her EKG is unchanged and she tells me she has had a work-up for ischemia.  Her d-dimer, troponin, and BNP are normal.  I do not think the DOE is related to cardiovascular disease.  I am concerned it is related to conditioning and obesity so I have asked her to improve her lifestyle modifications. -     D-dimer, quantitative (not at [facility]); Future -     Troponin I -; Future -     Brain natriuretic peptide; Future -     EKG 12-Lead  Colon cancer screening -     Cologuard  Hypothyroidism due to medication-her TSH is in the normal range.  She will remain on the current dose of levothyroxine. -     TSH; Future  Type II diabetes mellitus with manifestations ([place])- Her blood sugars are not quite adequately well controlled.  I have asked her to add Invokana for blood sugar control as well as cardiovascular risk reduction. -     Hemoglobin A1c; Future -     canagliflozin (INVOKANA) 100 MG TABS tablet; Take 1 tablet (100 mg total) by mouth daily before breakfast.  Visit for screening mammogram -     MM DIGITAL SCREENING BILATERAL; Future  Estrogen deficiency -     DG Bone Density; Future   I have discontinued Floyce A. Silvester's PARoxetine. I am also having her start on PARoxetine and canagliflozin. Additionally, I am having her maintain her glucose blood, metoprolol succinate, apixaban, amiodarone, hydrochlorothiazide, zolpidem, metFORMIN, potassium chloride SA, atorvastatin, and levothyroxine. We administered cyanocobalamin. We will continue to administer cyanocobalamin.  Meds ordered this encounter  Medications  . cyanocobalamin ((VITAMIN B-12)) injection 1,000 mcg  . PARoxetine (PAXIL-CR) 12.5 MG 24 hr tablet    Sig: Take 1 tablet (12.5 mg total) by mouth daily.    Dispense:  90 tablet    Refill:  0  . canagliflozin (INVOKANA) 100 MG TABS tablet    Sig: Take 1 tablet (100 mg total) by mouth daily before breakfast.    Dispense:  30 tablet    Refill:  0     Follow-up: Return in about 3  months (around 03/27/2018).  [name], MD

## 2017-12-26 ENCOUNTER — Encounter: Payer: Self-pay | Admitting: Internal Medicine

## 2017-12-26 LAB — D-DIMER, QUANTITATIVE: D-Dimer, Quant: <0.19 mcg/mL FEU (ref <0.50)

## 2017-12-26 MED ORDER — CANAGLIFLOZIN 100 MG PO TABS: 100.0000 mg | ORAL_TABLET | Freq: Every day | ORAL | 0 refills | Status: DC

## 2018-01-02 ENCOUNTER — Encounter: Payer: Self-pay | Admitting: Internal Medicine

## 2018-01-02 NOTE — Progress Notes (Signed)
Outside notes received. Information abstracted. Notes sent to scan.  

## 2018-01-05 ENCOUNTER — Other Ambulatory Visit: Payer: Self-pay | Admitting: Internal Medicine

## 2018-01-05 DIAGNOSIS — E118 Type 2 diabetes mellitus with unspecified complications: Secondary | ICD-10-CM

## 2018-02-13 ENCOUNTER — Encounter: Payer: PPO | Admitting: Obstetrics and Gynecology

## 2018-02-27 ENCOUNTER — Other Ambulatory Visit: Payer: Self-pay | Admitting: Internal Medicine

## 2018-02-27 DIAGNOSIS — Z1231 Encounter for screening mammogram for malignant neoplasm of breast: Secondary | ICD-10-CM

## 2018-03-05 ENCOUNTER — Other Ambulatory Visit: Payer: PPO

## 2018-03-11 ENCOUNTER — Other Ambulatory Visit: Payer: Self-pay | Admitting: Internal Medicine

## 2018-03-11 DIAGNOSIS — I4891 Unspecified atrial fibrillation: Secondary | ICD-10-CM | POA: Diagnosis not present

## 2018-03-11 DIAGNOSIS — I340 Nonrheumatic mitral (valve) insufficiency: Secondary | ICD-10-CM | POA: Diagnosis not present

## 2018-03-11 DIAGNOSIS — R0602 Shortness of breath: Secondary | ICD-10-CM | POA: Diagnosis not present

## 2018-03-11 DIAGNOSIS — E785 Hyperlipidemia, unspecified: Secondary | ICD-10-CM

## 2018-03-11 DIAGNOSIS — I10 Essential (primary) hypertension: Secondary | ICD-10-CM | POA: Diagnosis not present

## 2018-03-11 DIAGNOSIS — I2510 Atherosclerotic heart disease of native coronary artery without angina pectoris: Secondary | ICD-10-CM | POA: Diagnosis not present

## 2018-03-12 ENCOUNTER — Other Ambulatory Visit: Payer: Self-pay | Admitting: Internal Medicine

## 2018-03-16 ENCOUNTER — Other Ambulatory Visit: Payer: Self-pay | Admitting: Internal Medicine

## 2018-03-16 DIAGNOSIS — E038 Other specified hypothyroidism: Secondary | ICD-10-CM

## 2018-03-20 ENCOUNTER — Encounter: Payer: Self-pay | Admitting: Internal Medicine

## 2018-03-20 ENCOUNTER — Other Ambulatory Visit (INDEPENDENT_AMBULATORY_CARE_PROVIDER_SITE_OTHER): Payer: PPO

## 2018-03-20 ENCOUNTER — Ambulatory Visit (INDEPENDENT_AMBULATORY_CARE_PROVIDER_SITE_OTHER): Payer: PPO | Admitting: Internal Medicine

## 2018-03-20 VITALS — BP 136/86 | HR 78 | Temp 98.1°F | Resp 16 | Ht 67.0 in | Wt 214.8 lb

## 2018-03-20 DIAGNOSIS — Z1159 Encounter for screening for other viral diseases: Secondary | ICD-10-CM | POA: Diagnosis not present

## 2018-03-20 DIAGNOSIS — E538 Deficiency of other specified B group vitamins: Secondary | ICD-10-CM

## 2018-03-20 DIAGNOSIS — F5105 Insomnia due to other mental disorder: Secondary | ICD-10-CM

## 2018-03-20 DIAGNOSIS — E781 Pure hyperglyceridemia: Secondary | ICD-10-CM | POA: Diagnosis not present

## 2018-03-20 DIAGNOSIS — E032 Hypothyroidism due to medicaments and other exogenous substances: Secondary | ICD-10-CM

## 2018-03-20 DIAGNOSIS — I10 Essential (primary) hypertension: Secondary | ICD-10-CM

## 2018-03-20 DIAGNOSIS — Z114 Encounter for screening for human immunodeficiency virus [HIV]: Secondary | ICD-10-CM

## 2018-03-20 DIAGNOSIS — E118 Type 2 diabetes mellitus with unspecified complications: Secondary | ICD-10-CM

## 2018-03-20 DIAGNOSIS — F409 Phobic anxiety disorder, unspecified: Secondary | ICD-10-CM | POA: Diagnosis not present

## 2018-03-20 DIAGNOSIS — E785 Hyperlipidemia, unspecified: Secondary | ICD-10-CM | POA: Diagnosis not present

## 2018-03-20 LAB — COMPREHENSIVE METABOLIC PANEL
ALT: 28 U/L (ref 0–35)
AST: 20 U/L (ref 0–37)
Albumin: 4.4 g/dL (ref 3.5–5.2)
Alkaline Phosphatase: 70 U/L (ref 39–117)
BILIRUBIN TOTAL: 0.5 mg/dL (ref 0.2–1.2)
BUN: 16 mg/dL (ref 6–23)
CO2: 30 mEq/L (ref 19–32)
Calcium: 10.1 mg/dL (ref 8.4–10.5)
Chloride: 102 mEq/L (ref 96–112)
Creatinine, Ser: 0.82 mg/dL (ref 0.40–1.20)
GFR: 70.96 mL/min (ref >60.00)
Glucose, Bld: 108 mg/dL — ABNORMAL HIGH (ref 70–99)
Potassium: 3.6 mEq/L (ref 3.5–5.1)
Sodium: 141 mEq/L (ref 135–145)
TOTAL PROTEIN: 7 g/dL (ref 6.0–8.3)

## 2018-03-20 LAB — LIPID PANEL
Cholesterol: 121 mg/dL (ref 0–200)
HDL: 40.7 mg/dL (ref >39.00)
NonHDL: 80.32
Total CHOL/HDL Ratio: 3
Triglycerides: 230 mg/dL — ABNORMAL HIGH (ref 0.0–149.0)
VLDL: 46 mg/dL — ABNORMAL HIGH (ref 0.0–40.0)

## 2018-03-20 LAB — CBC WITH DIFFERENTIAL/PLATELET
Basophils Absolute: 0.2 10*3/uL — ABNORMAL HIGH (ref 0.0–0.1)
Basophils Relative: 2.2 % (ref 0.0–3.0)
Eosinophils Absolute: 0.3 10*3/uL (ref 0.0–0.7)
Eosinophils Relative: 3.4 % (ref 0.0–5.0)
HCT: 41.1 % (ref 36.0–46.0)
Hemoglobin: 13.7 g/dL (ref 12.0–15.0)
Lymphocytes Relative: 35.6 % (ref 12.0–46.0)
Lymphs Abs: 3.6 10*3/uL (ref 0.7–4.0)
MCHC: 33.3 g/dL (ref 30.0–36.0)
MCV: 87.8 fl (ref 78.0–100.0)
Monocytes Absolute: 0.6 10*3/uL (ref 0.1–1.0)
Monocytes Relative: 6.4 % (ref 3.0–12.0)
Neutro Abs: 5.3 10*3/uL (ref 1.4–7.7)
Neutrophils Relative %: 52.4 % (ref 43.0–77.0)
Platelets: 314 10*3/uL (ref 150.0–400.0)
RBC: 4.67 Mil/uL (ref 3.87–5.11)
RDW: 15.1 % (ref 11.5–15.5)
WBC: 10.1 10*3/uL (ref 4.0–10.5)

## 2018-03-20 LAB — LDL CHOLESTEROL, DIRECT: LDL DIRECT: 56 mg/dL

## 2018-03-20 LAB — TSH: TSH: 2.27 u[IU]/mL (ref 0.35–4.50)

## 2018-03-20 LAB — HEMOGLOBIN A1C: Hgb A1c MFr Bld: 7 % — ABNORMAL HIGH (ref 4.6–6.5)

## 2018-03-20 MED ORDER — CYANOCOBALAMIN 1000 MCG/ML IJ SOLN
1000.0000 ug | Freq: Once | INTRAMUSCULAR | Status: AC
  Administered 2018-03-20: 1000 ug via INTRAMUSCULAR

## 2018-03-20 MED ORDER — ZOLPIDEM TARTRATE 5 MG PO TABS: 5.0000 mg | ORAL_TABLET | Freq: Every evening | ORAL | 1 refills | Status: DC | PRN

## 2018-03-20 NOTE — Patient Instructions (Signed)
Type 2 Diabetes Mellitus, Diagnosis, Adult Type 2 diabetes (type 2 diabetes mellitus) is a long-term (chronic) disease. In type 2 diabetes, one or both of these problems may be present:  The pancreas does not make enough of a hormone called insulin.  Cells in the body do not respond properly to insulin that the body makes (insulin resistance). Normally, insulin allows blood sugar (glucose) to enter cells in the body. The cells use glucose for energy. Insulin resistance or lack of insulin causes excess glucose to build up in the blood instead of going into cells. As a result, high blood glucose (hyperglycemia) develops. What increases the risk? The following factors may make you more likely to develop type 2 diabetes:  Having a family member with type 2 diabetes.  Being overweight or obese.  Having an inactive (sedentary) lifestyle.  Having been diagnosed with insulin resistance.  Having a history of prediabetes, gestational diabetes, or polycystic ovary syndrome (PCOS).  Being of American-Indian, African-American, Hispanic/Latino, or Asian/Pacific Islander descent. What are the signs or symptoms? In the early stage of this condition, you may not have symptoms. Symptoms develop slowly and may include:  Increased thirst (polydipsia).  Increased hunger(polyphagia).  Increased urination (polyuria).  Increased urination during the night (nocturia).  Unexplained weight loss.  Frequent infections that keep coming back (recurring).  Fatigue.  Weakness.  Vision changes, such as blurry vision.  Cuts or bruises that are slow to heal.  Tingling or numbness in the hands or feet.  Dark patches on the skin (acanthosis nigricans). How is this diagnosed? This condition is diagnosed based on your symptoms, your medical history, a physical exam, and your blood glucose level. Your blood glucose may be checked with one or more of the following blood tests:  A fasting blood glucose (FBG)  test. You will not be allowed to eat (you will fast) for 8 hours or longer before a blood sample is taken.  A random blood glucose test. This test checks blood glucose at any time of day regardless of when you ate.  An A1c (hemoglobin A1c) blood test. This test provides information about blood glucose control over the previous 2-3 months.  An oral glucose tolerance test (OGTT). This test measures your blood glucose at two times: ? After fasting. This is your baseline blood glucose level. ? Two hours after drinking a beverage that contains glucose. You may be diagnosed with type 2 diabetes if:  Your FBG level is 126 mg/dL (7.0 mmol/L) or higher.  Your random blood glucose level is 200 mg/dL (11.1 mmol/L) or higher.  Your A1c level is 6.5% or higher.  Your OGTT result is higher than 200 mg/dL (11.1 mmol/L). These blood tests may be repeated to confirm your diagnosis. How is this treated? Your treatment may be managed by a specialist called an endocrinologist. Type 2 diabetes may be treated by following instructions from your health care provider about:  Making diet and lifestyle changes. This may include: ? Following an individualized nutrition plan that is developed by a diet and nutrition specialist (registered dietitian). ? Exercising regularly. ? Finding ways to manage stress.  Checking your blood glucose level as often as told.  Taking diabetes medicines or insulin daily. This helps to keep your blood glucose levels in the healthy range. ? If you use insulin, you may need to adjust the dosage depending on how physically active you are and what foods you eat. Your health care provider will tell you how to adjust your dosage.    Taking medicines to help prevent complications from diabetes, such as: ? Aspirin. ? Medicine to lower cholesterol. ? Medicine to control blood pressure. Your health care provider will set individualized treatment goals for you. Your goals will be based on  your age, other medical conditions you have, and how you respond to diabetes treatment. Generally, the goal of treatment is to maintain the following blood glucose levels:  Before meals (preprandial): 80-130 mg/dL (4.4-7.2 mmol/L).  After meals (postprandial): below 180 mg/dL (10 mmol/L).  A1c level: less than 7%. Follow these instructions at home: Questions to ask your health care provider  Consider asking the following questions: ? Do I need to meet with a diabetes educator? ? Where can I find a support group for people with diabetes? ? What equipment will I need to manage my diabetes at home? ? What diabetes medicines do I need, and when should I take them? ? How often do I need to check my blood glucose? ? What number can I call if I have questions? ? When is my next appointment? General instructions  Take over-the-counter and prescription medicines only as told by your health care provider.  Keep all follow-up visits as told by your health care provider. This is important.  For more information about diabetes, visit: ? American Diabetes Association (ADA): www.diabetes.org ? American Association of Diabetes Educators (AADE): www.diabeteseducator.org Contact a health care provider if:  Your blood glucose is at or above 240 mg/dL (13.3 mmol/L) for 2 days in a row.  You have been sick or have had a fever for 2 days or longer, and you are not getting better.  You have any of the following problems for more than 6 hours: ? You cannot eat or drink. ? You have nausea and vomiting. ? You have diarrhea. Get help right away if:  Your blood glucose is lower than 54 mg/dL (3.0 mmol/L).  You become confused or you have trouble thinking clearly.  You have difficulty breathing.  You have moderate or large ketone levels in your urine. Summary  Type 2 diabetes (type 2 diabetes mellitus) is a long-term (chronic) disease. In type 2 diabetes, the pancreas does not make enough of a  hormone called insulin, or cells in the body do not respond properly to insulin that the body makes (insulin resistance).  This condition is treated by making diet and lifestyle changes and taking diabetes medicines or insulin.  Your health care provider will set individualized treatment goals for you. Your goals will be based on your age, other medical conditions you have, and how you respond to diabetes treatment.  Keep all follow-up visits as told by your health care provider. This is important. This information is not intended to replace advice given to you by your health care provider. Make sure you discuss any questions you have with your health care provider. Document Released: 01/29/2005 Document Revised: 08/30/2016 Document Reviewed: 03/04/2015 Elsevier Interactive Patient Education  2019 Elsevier Inc.  

## 2018-03-20 NOTE — Progress Notes (Signed)
Subjective:  Patient ID: [name], female    DOB: 1955-06-16  Age: 63 y.o. MRN: [id number]  CC: Hypertension; Diabetes; Hypothyroidism; and Hyperlipidemia   HPI Janesha Brissette Mcconaughy presents for f/up -  She tells me her blood pressure and blood sugars have been well controlled recently.  She is active and denies any recent episodes of CP, DOE, palpitations, edema, or fatigue.  Outpatient Medications Prior to Visit  Medication Sig Dispense Refill  . amiodarone (PACERONE) 200 MG tablet Take 200 mg daily by mouth.    Marland Kitchen apixaban (ELIQUIS) 5 MG TABS tablet Take 1 tablet (5 mg total) by mouth 2 (two) times daily. 180 tablet 1  . atorvastatin (LIPITOR) 40 MG tablet TAKE 1 TABLET(40 MG) BY MOUTH DAILY 90 tablet 1  . canagliflozin (INVOKANA) 100 MG TABS tablet Take 1 tablet (100 mg total) by mouth daily before breakfast. 30 tablet 0  . glucose blood (ONE TOUCH ULTRA TEST) test strip Use as instructed 100 each 12  . hydrochlorothiazide (HYDRODIURIL) 25 MG tablet Take 1 tablet (25 mg total) by mouth daily. 90 tablet 1  . levothyroxine (SYNTHROID, LEVOTHROID) 100 MCG tablet TAKE 1 TABLET(100 MCG) BY MOUTH DAILY 90 tablet 0  . metFORMIN (GLUCOPHAGE) 1000 MG tablet TAKE 1 TABLET(1000 MG) BY MOUTH TWICE DAILY WITH A MEAL. FOLLOW-UP APPOINTMENT DUE IN JULY 180 tablet 1  . metoprolol succinate (TOPROL-XL) 50 MG 24 hr tablet Take 50 mg by mouth daily.     Marland Kitchen PARoxetine (PAXIL-CR) 12.5 MG 24 hr tablet Take 1 tablet (12.5 mg total) by mouth daily. 90 tablet 0  . zolpidem (AMBIEN) 5 MG tablet Take 1 tablet (5 mg total) by mouth at bedtime as needed for sleep. 90 tablet 1  . PARoxetine (PAXIL-CR) 25 MG 24 hr tablet TAKE 1 TABLET(25 MG) BY MOUTH DAILY 90 tablet 1  . potassium chloride SA (K-DUR,KLOR-CON) 20 MEQ tablet Take 1 tablet (20 mEq total) by mouth 3 (three) times daily. (Patient not taking: Reported on 03/20/2018) 270 tablet 1  . cyanocobalamin ((VITAMIN B-12)) injection 1,000 mcg      No  facility-administered medications prior to visit.     ROS Review of Systems  Constitutional: Negative.  Negative for appetite change, diaphoresis, fatigue and unexpected weight change.  HENT: Negative.   Eyes: Negative for visual disturbance.  Respiratory: Negative for cough, chest tightness, shortness of breath and wheezing.   Cardiovascular: Negative for chest pain, palpitations and leg swelling.  Gastrointestinal: Negative for abdominal pain, constipation, diarrhea, nausea and vomiting.  Endocrine: Negative for cold intolerance, heat intolerance, polydipsia, polyphagia and polyuria.  Genitourinary: Negative.  Negative for difficulty urinating and dysuria.  Musculoskeletal: Negative.  Negative for arthralgias, back pain and myalgias.  Skin: Negative.  Negative for color change, pallor and rash.  Neurological: Negative.  Negative for dizziness, weakness, light-headedness and headaches.  Hematological: Negative for adenopathy. Does not bruise/bleed easily.  Psychiatric/Behavioral: Positive for sleep disturbance. Negative for dysphoric mood. The patient is not nervous/anxious.        She complains of insomnia and requests a refill of Ambien    Objective:  BP 136/86 (BP Location: Left Arm, Patient Position: Sitting, Cuff Size: Large)   Pulse 78   Temp 98.1 F (36.7 C) (Oral)   Resp 16   Ht 5' 7"  (1.702 m)   Wt 214 lb 12 oz (97.4 kg)   SpO2 92%   BMI 33.63 kg/m   BP Readings from Last 3 Encounters:  03/20/18 136/86  12/25/17 136/80  09/04/17 110/70    Wt Readings from Last 3 Encounters:  03/20/18 214 lb 12 oz (97.4 kg)  12/25/17 218 lb (98.9 kg)  09/04/17 222 lb 8 oz (100.9 kg)    Physical Exam Vitals signs reviewed.  Constitutional:      Appearance: She is obese. She is not ill-appearing or diaphoretic.  HENT:     Nose: Nose normal. No congestion or rhinorrhea.     Mouth/Throat:     Mouth: Mucous membranes are moist.     Pharynx: Oropharynx is clear. No  oropharyngeal exudate or posterior oropharyngeal erythema.  Eyes:     General: No scleral icterus.    Conjunctiva/sclera: Conjunctivae normal.  Neck:     Musculoskeletal: Normal range of motion and neck supple. No muscular tenderness.  Cardiovascular:     Rate and Rhythm: Normal rate and regular rhythm.     Pulses: Normal pulses.     Heart sounds: No murmur. No gallop.   Pulmonary:     Effort: Pulmonary effort is normal.     Breath sounds: No stridor. No wheezing, rhonchi or rales.  Abdominal:     General: Abdomen is flat. Bowel sounds are normal.     Palpations: Abdomen is soft. There is no mass.     Tenderness: There is no abdominal tenderness. There is no guarding.  Musculoskeletal: Normal range of motion.        General: No swelling.     Right lower leg: No edema.     Left lower leg: No edema.  Lymphadenopathy:     Cervical: No cervical adenopathy.  Skin:    General: Skin is warm and dry.     Coloration: Skin is not jaundiced or pale.     Findings: No erythema.  Neurological:     General: No focal deficit present.     Mental Status: She is oriented to person, place, and time. Mental status is at baseline.  Psychiatric:        Mood and Affect: Mood normal.        Behavior: Behavior normal.        Thought Content: Thought content normal.        Judgment: Judgment normal.     Lab Results  Component Value Date   WBC 10.1 03/20/2018   HGB 13.7 03/20/2018   HCT 41.1 03/20/2018   PLT 314.0 03/20/2018   GLUCOSE 108 (H) 03/20/2018   CHOL 121 03/20/2018   TRIG 230.0 (H) 03/20/2018   HDL 40.70 03/20/2018   LDLDIRECT 56.0 03/20/2018   LDLCALC SEE COMMENT 11/26/2013   ALT 28 03/20/2018   AST 20 03/20/2018   NA 141 03/20/2018   K 3.6 03/20/2018   CL 102 03/20/2018   CREATININE 0.82 03/20/2018   BUN 16 03/20/2018   CO2 30 03/20/2018   TSH 2.27 03/20/2018   INR 1.1 11/27/2013   HGBA1C 7.0 (H) 03/20/2018   MICROALBUR <0.7 09/04/2017    Dg Shoulder Right  Result  Date: 07/24/2017 CLINICAL DATA:  Shoulder pain for several weeks, no known injury, initial encounter EXAM: RIGHT SHOULDER - 2+ VIEW COMPARISON:  None. FINDINGS: Mild degenerative changes of the acromioclavicular joint are seen. No acute fracture or dislocation is noted. No soft tissue abnormality is noted. IMPRESSION: Degenerative changes of the acromioclavicular joint without acute abnormality. Electronically Signed   By: Inez Catalina M.D.   On: 07/24/2017 14:02   [place] Limited Joint Space Structures Up Right  Result Date: 07/29/2017 Procedure:  Real-time Ultrasound Guided Injection of right glenohumeral joint Device: GE Logiq E Ultrasound guided injection is preferred based studies that show increased duration, increased effect, greater accuracy, decreased procedural pain, increased response rate with ultrasound guided versus blind injection. Verbal informed consent obtained. Time-out conducted. Noted no overlying erythema, induration, or other signs of local infection. Skin prepped in a sterile fashion. Local anesthesia: Topical Ethyl chloride. With sterile technique and under real time ultrasound guidance: Joint visualized. 23g 1  inch needle inserted posterior approach. Pictures taken for needle placement. Patient did have injection of 2 cc of 1% lidocaine, 2 cc of 0.5% Marcaine, and 1.0 cc of Kenalog 40 mg/dL. Completed without difficulty Pain immediately resolved suggesting accurate placement of the medication. Advised to call if fevers/chills, erythema, induration, drainage, or persistent bleeding. Images permanently stored and available for review in the ultrasound unit. Impression: Technically successful ultrasound guided injection.    Assessment & Plan:   Whiteside was seen today for hypertension, diabetes, hypothyroidism and hyperlipidemia.  Diagnoses and all orders for this visit:  Vitamin B 12 deficiency -     cyanocobalamin ((VITAMIN B-12)) injection 1,000 mcg -     CBC with  Differential/Platelet; Future  Encounter for screening for HIV -     HIV Antibody (routine testing w rflx); Future  Need for hepatitis C screening test -     Hepatitis C antibody; Future  Essential hypertension, benign- Her blood pressure is adequately well controlled.  Electrolytes and renal function are normal. -     Comprehensive metabolic panel; Future  Hypothyroidism due to medication- Her TSH is in the normal range.  She will remain on the current dose of levothyroxine. -     TSH; Future  Type II diabetes mellitus with manifestations ([place])- Her A1c is at 7.0%.  Her blood sugars are adequately well controlled. -     Comprehensive metabolic panel; Future -     Hemoglobin A1c; Future  Hyperlipidemia with target LDL less than 100- She has achieved her LDL goal and is doing well on the statin. -     Lipid panel; Future  Hypertriglyceridemia- Her triglycerides remain mildly elevated.  I have asked her to decrease her intake of fat and carbohydrates. -     Lipid panel; Future  Insomnia due to anxiety and fear -     zolpidem (AMBIEN) 5 MG tablet; Take 1 tablet (5 mg total) by mouth at bedtime as needed for sleep.   I have discontinued Marriana A. Woodmansee's potassium chloride SA, PARoxetine, and PARoxetine. I am also having her maintain her glucose blood, metoprolol succinate, apixaban, amiodarone, hydrochlorothiazide, canagliflozin, metFORMIN, atorvastatin, levothyroxine, and zolpidem. We will stop administering cyanocobalamin. Additionally, we administered cyanocobalamin.  Meds ordered this encounter  Medications  . cyanocobalamin ((VITAMIN B-12)) injection 1,000 mcg  . zolpidem (AMBIEN) 5 MG tablet    Sig: Take 1 tablet (5 mg total) by mouth at bedtime as needed for sleep.    Dispense:  90 tablet    Refill:  1     Follow-up: Return in about 6 months (around 09/18/2018).  [name], MD

## 2018-03-21 LAB — HEPATITIS C ANTIBODY
Hepatitis C Ab: NONREACTIVE
SIGNAL TO CUT-OFF: 0.01 (ref <1.00)

## 2018-03-21 LAB — HIV ANTIBODY (ROUTINE TESTING W REFLEX): HIV 1&2 Ab, 4th Generation: NONREACTIVE

## 2018-03-22 ENCOUNTER — Encounter: Payer: Self-pay | Admitting: Internal Medicine

## 2018-03-31 DIAGNOSIS — M5136 Other intervertebral disc degeneration, lumbar region: Secondary | ICD-10-CM | POA: Diagnosis not present

## 2018-03-31 DIAGNOSIS — M9905 Segmental and somatic dysfunction of pelvic region: Secondary | ICD-10-CM | POA: Diagnosis not present

## 2018-03-31 DIAGNOSIS — M9903 Segmental and somatic dysfunction of lumbar region: Secondary | ICD-10-CM | POA: Diagnosis not present

## 2018-03-31 DIAGNOSIS — M955 Acquired deformity of pelvis: Secondary | ICD-10-CM | POA: Diagnosis not present

## 2018-04-03 DIAGNOSIS — M5136 Other intervertebral disc degeneration, lumbar region: Secondary | ICD-10-CM | POA: Diagnosis not present

## 2018-04-03 DIAGNOSIS — M9905 Segmental and somatic dysfunction of pelvic region: Secondary | ICD-10-CM | POA: Diagnosis not present

## 2018-04-03 DIAGNOSIS — M955 Acquired deformity of pelvis: Secondary | ICD-10-CM | POA: Diagnosis not present

## 2018-04-03 DIAGNOSIS — M9903 Segmental and somatic dysfunction of lumbar region: Secondary | ICD-10-CM | POA: Diagnosis not present

## 2018-04-07 DIAGNOSIS — M9903 Segmental and somatic dysfunction of lumbar region: Secondary | ICD-10-CM | POA: Diagnosis not present

## 2018-04-07 DIAGNOSIS — M5136 Other intervertebral disc degeneration, lumbar region: Secondary | ICD-10-CM | POA: Diagnosis not present

## 2018-04-07 DIAGNOSIS — M955 Acquired deformity of pelvis: Secondary | ICD-10-CM | POA: Diagnosis not present

## 2018-04-07 DIAGNOSIS — M9905 Segmental and somatic dysfunction of pelvic region: Secondary | ICD-10-CM | POA: Diagnosis not present

## 2018-04-08 ENCOUNTER — Encounter: Payer: Self-pay | Admitting: Internal Medicine

## 2018-04-08 ENCOUNTER — Ambulatory Visit
Admission: RE | Admit: 2018-04-08 | Discharge: 2018-04-08 | Disposition: A | Discharge status: Home / Self Care | Payer: PPO | Source: Ambulatory Visit | Attending: Internal Medicine | Admitting: Internal Medicine

## 2018-04-08 DIAGNOSIS — M8589 Other specified disorders of bone density and structure, multiple sites: Secondary | ICD-10-CM | POA: Diagnosis not present

## 2018-04-08 DIAGNOSIS — Z1231 Encounter for screening mammogram for malignant neoplasm of breast: Secondary | ICD-10-CM | POA: Diagnosis not present

## 2018-04-08 DIAGNOSIS — E2839 Other primary ovarian failure: Secondary | ICD-10-CM | POA: Diagnosis not present

## 2018-04-08 DIAGNOSIS — Z780 Asymptomatic menopausal state: Secondary | ICD-10-CM | POA: Diagnosis not present

## 2018-04-08 LAB — HM MAMMOGRAPHY

## 2018-04-08 LAB — HM DEXA SCAN: HM Dexa Scan: -1.2

## 2018-04-09 DIAGNOSIS — M9905 Segmental and somatic dysfunction of pelvic region: Secondary | ICD-10-CM | POA: Diagnosis not present

## 2018-04-09 DIAGNOSIS — M955 Acquired deformity of pelvis: Secondary | ICD-10-CM | POA: Diagnosis not present

## 2018-04-09 DIAGNOSIS — M5136 Other intervertebral disc degeneration, lumbar region: Secondary | ICD-10-CM | POA: Diagnosis not present

## 2018-04-09 DIAGNOSIS — M9903 Segmental and somatic dysfunction of lumbar region: Secondary | ICD-10-CM | POA: Diagnosis not present

## 2018-05-01 ENCOUNTER — Other Ambulatory Visit: Payer: Self-pay | Admitting: Internal Medicine

## 2018-05-01 ENCOUNTER — Encounter: Payer: Self-pay | Admitting: Internal Medicine

## 2018-05-01 DIAGNOSIS — F418 Other specified anxiety disorders: Secondary | ICD-10-CM

## 2018-05-01 MED ORDER — PAROXETINE HCL ER 12.5 MG PO TB24: 12.5000 mg | ORAL_TABLET | Freq: Every day | ORAL | 0 refills | Status: DC

## 2018-05-09 ENCOUNTER — Encounter: Payer: Self-pay | Admitting: Internal Medicine

## 2018-05-09 ENCOUNTER — Ambulatory Visit (INDEPENDENT_AMBULATORY_CARE_PROVIDER_SITE_OTHER): Payer: PPO | Admitting: Internal Medicine

## 2018-05-09 DIAGNOSIS — F411 Generalized anxiety disorder: Secondary | ICD-10-CM

## 2018-05-09 DIAGNOSIS — J4530 Mild persistent asthma, uncomplicated: Secondary | ICD-10-CM | POA: Diagnosis not present

## 2018-05-09 DIAGNOSIS — H109 Unspecified conjunctivitis: Secondary | ICD-10-CM

## 2018-05-09 MED ORDER — ERYTHROMYCIN 5 MG/GM OP OINT: 1.0000 "application " | TOPICAL_OINTMENT | Freq: Four times a day (QID) | OPHTHALMIC | 0 refills | Status: AC

## 2018-05-09 NOTE — Progress Notes (Signed)
Virtual Visit via Video Note  I connected with Kirsten [name] on 05/09/18 at  1:20 PM EDT by a video enabled telemedicine application and verified that I am speaking with the correct person using two identifiers.  Pt is at home, I am in office, and husband also present for this video call   I discussed the limitations of evaluation and management by telemedicine and the availability of in person appointments. The patient expressed understanding and agreed to proceed.  History of Present Illness: Pt c/o acute bilat right > left eye itching, burning, weepiness, crusting for 2-3 days, may have started soon after the grandkids were over to visit.  No vision change, denies sinus or ear pain or ST or cough, but does have mild HA.  No hx of allergic conjunctivitis, just feels bad and fatigued otherwise.  No high fever, chills, cough, and Pt denies chest pain, increased sob or doe, wheezing, orthopnea, PND, increased LE swelling, palpitations, dizziness or syncope.  Pt denies new neurological symptoms such as new headache, or facial or extremity weakness or numbness   Pt denies polydipsia, polyuria  Denies worsening depressive symptoms, suicidal ideation, or panic   Past Medical History:  Diagnosis Date  . A-fib ([place])   . Anxiety disorder   . Asthma   . Dyspnea   . Gastroschisis    umphalocele, rotated organs abd repair until age 6  . GERD (gastroesophageal reflux disease)   . HTN (hypertension)   . Hyperlipidemia   . IBS (irritable bowel syndrome)   . Morbid obesity ([place])    Target wt - 185  for BMI < 30  . Obesity   . OSA on CPAP   . Pulmonary fibrosis ([place])   . SBO (small bowel obstruction) ([place])    Resolved with NG/Bowel rest around 2009  . Sleep apnea   . Type II or unspecified type diabetes mellitus without mention of complication, not stated as uncontrolled    Past Surgical History:  Procedure Laterality Date  . BREAST EXCISIONAL BIOPSY Left 03/19/2012   neg  . CESAREAN SECTION     . CHOLECYSTECTOMY  1992  . Newborn Surgery - GI - ORGANS OUTSIDE ABDOMEN    . Small Bowel Repair    . TUBAL LIGATION  1988    reports that she has never smoked. She has never used smokeless tobacco. She reports current alcohol use of about 2.0 standard drinks of alcohol per week. She reports that she does not use drugs. family history includes Colon cancer in her father; Diabetes in her maternal grandmother; Diabetes type I in her son; Diabetes type II in her father; Lymphoma in her mother. Allergies  Allergen Reactions  . Factive [Gemifloxacin Mesylate] Rash  . Crestor [Rosuvastatin]     GI upset  . Sulfonamide Derivatives     REACTION: rash  . Gemifloxacin Rash  . Penicillins Hives and Rash    Has patient had a PCN reaction causing immediate rash, facial/tongue/throat swelling, SOB or lightheadedness with hypotension: No Has patient had a PCN reaction causing severe rash involving mucus membranes or skin necrosis: No Has patient had a PCN reaction that required hospitalization No Has patient had a PCN reaction occurring within the last 10 years: No If all of the above answers are "NO", then may proceed with Cephalosporin use.    Current Outpatient Medications on File Prior to Visit  Medication Sig Dispense Refill  . amiodarone (PACERONE) 200 MG tablet Take 200 mg daily by mouth.    Marland Kitchen  apixaban (ELIQUIS) 5 MG TABS tablet Take 1 tablet (5 mg total) by mouth 2 (two) times daily. 180 tablet 1  . atorvastatin (LIPITOR) 40 MG tablet TAKE 1 TABLET(40 MG) BY MOUTH DAILY 90 tablet 1  . canagliflozin (INVOKANA) 100 MG TABS tablet Take 1 tablet (100 mg total) by mouth daily before breakfast. 30 tablet 0  . glucose blood (ONE TOUCH ULTRA TEST) test strip Use as instructed 100 each 12  . hydrochlorothiazide (HYDRODIURIL) 25 MG tablet Take 1 tablet (25 mg total) by mouth daily. 90 tablet 1  . levothyroxine (SYNTHROID, LEVOTHROID) 100 MCG tablet TAKE 1 TABLET(100 MCG) BY MOUTH DAILY 90 tablet 0   . metFORMIN (GLUCOPHAGE) 1000 MG tablet TAKE 1 TABLET(1000 MG) BY MOUTH TWICE DAILY WITH A MEAL. FOLLOW-UP APPOINTMENT DUE IN JULY 180 tablet 1  . metoprolol succinate (TOPROL-XL) 50 MG 24 hr tablet Take 50 mg by mouth daily.     Marland Kitchen PARoxetine (PAXIL-CR) 12.5 MG 24 hr tablet Take 1 tablet (12.5 mg total) by mouth daily. 90 tablet 0  . zolpidem (AMBIEN) 5 MG tablet Take 1 tablet (5 mg total) by mouth at bedtime as needed for sleep. 90 tablet 1   No current facility-administered medications on file prior to visit.     Observations/Objective: Obese, mild ill, fatigue, mild nervous, somewhat flushed, bilat eyes right > left upper and lower lid swelling and erythema with crusty weepy bilat eye d/c, cn 2-12 intact, moves all 4s  Assessment and Plan: Conjunctivitis - bilateral, likely infectious cant r/o bacterial, for eryth op asd due to her allergies, warm compresses, tylenol, rest and fluids.  To f/u with any worsening s/s.    Follow Up Instructions: A above   I discussed the assessment and treatment plan with the patient. The patient was provided an opportunity to ask questions and all were answered. The patient agreed with the plan and demonstrated an understanding of the instructions.   The patient was advised to call back or seek an in-person evaluation if the symptoms worsen or if the condition fails to improve as anticipated.  I provided 10 minutes of non-face-to-face time during this encounter.   Cathlean Cower, MD

## 2018-05-09 NOTE — Assessment & Plan Note (Signed)
stable overall by history and exam, recent data reviewed with pt, and pt to continue medical treatment as before,  to f/u any worsening symptoms or concerns  

## 2018-05-09 NOTE — Patient Instructions (Signed)
See above

## 2018-05-09 NOTE — Assessment & Plan Note (Signed)
Mild to mod, for antibx course,  to f/u any worsening symptoms or concerns 

## 2018-06-05 ENCOUNTER — Encounter: Payer: Self-pay | Admitting: Internal Medicine

## 2018-06-13 DIAGNOSIS — G4733 Obstructive sleep apnea (adult) (pediatric): Secondary | ICD-10-CM | POA: Diagnosis not present

## 2018-06-16 ENCOUNTER — Other Ambulatory Visit: Payer: Self-pay | Admitting: Internal Medicine

## 2018-06-16 DIAGNOSIS — E038 Other specified hypothyroidism: Secondary | ICD-10-CM

## 2018-06-16 MED ORDER — LEVOTHYROXINE SODIUM 100 MCG PO TABS: ORAL_TABLET | ORAL | 1 refills | Status: DC

## 2018-07-05 ENCOUNTER — Other Ambulatory Visit: Payer: Self-pay | Admitting: Internal Medicine

## 2018-07-05 DIAGNOSIS — E118 Type 2 diabetes mellitus with unspecified complications: Secondary | ICD-10-CM

## 2018-07-18 DIAGNOSIS — G4733 Obstructive sleep apnea (adult) (pediatric): Secondary | ICD-10-CM | POA: Diagnosis not present

## 2018-07-21 DIAGNOSIS — M9905 Segmental and somatic dysfunction of pelvic region: Secondary | ICD-10-CM | POA: Diagnosis not present

## 2018-07-21 DIAGNOSIS — M9903 Segmental and somatic dysfunction of lumbar region: Secondary | ICD-10-CM | POA: Diagnosis not present

## 2018-07-21 DIAGNOSIS — M955 Acquired deformity of pelvis: Secondary | ICD-10-CM | POA: Diagnosis not present

## 2018-07-21 DIAGNOSIS — M5136 Other intervertebral disc degeneration, lumbar region: Secondary | ICD-10-CM | POA: Diagnosis not present

## 2018-07-23 DIAGNOSIS — M955 Acquired deformity of pelvis: Secondary | ICD-10-CM | POA: Diagnosis not present

## 2018-07-23 DIAGNOSIS — M5136 Other intervertebral disc degeneration, lumbar region: Secondary | ICD-10-CM | POA: Diagnosis not present

## 2018-07-23 DIAGNOSIS — M9903 Segmental and somatic dysfunction of lumbar region: Secondary | ICD-10-CM | POA: Diagnosis not present

## 2018-07-23 DIAGNOSIS — M9905 Segmental and somatic dysfunction of pelvic region: Secondary | ICD-10-CM | POA: Diagnosis not present

## 2018-07-24 DIAGNOSIS — M9905 Segmental and somatic dysfunction of pelvic region: Secondary | ICD-10-CM | POA: Diagnosis not present

## 2018-07-24 DIAGNOSIS — M5136 Other intervertebral disc degeneration, lumbar region: Secondary | ICD-10-CM | POA: Diagnosis not present

## 2018-07-24 DIAGNOSIS — M9903 Segmental and somatic dysfunction of lumbar region: Secondary | ICD-10-CM | POA: Diagnosis not present

## 2018-07-24 DIAGNOSIS — M955 Acquired deformity of pelvis: Secondary | ICD-10-CM | POA: Diagnosis not present

## 2018-07-28 DIAGNOSIS — M5136 Other intervertebral disc degeneration, lumbar region: Secondary | ICD-10-CM | POA: Diagnosis not present

## 2018-07-28 DIAGNOSIS — M9905 Segmental and somatic dysfunction of pelvic region: Secondary | ICD-10-CM | POA: Diagnosis not present

## 2018-07-28 DIAGNOSIS — M9903 Segmental and somatic dysfunction of lumbar region: Secondary | ICD-10-CM | POA: Diagnosis not present

## 2018-07-28 DIAGNOSIS — M955 Acquired deformity of pelvis: Secondary | ICD-10-CM | POA: Diagnosis not present

## 2018-07-31 DIAGNOSIS — M9905 Segmental and somatic dysfunction of pelvic region: Secondary | ICD-10-CM | POA: Diagnosis not present

## 2018-07-31 DIAGNOSIS — R42 Dizziness and giddiness: Secondary | ICD-10-CM | POA: Diagnosis not present

## 2018-07-31 DIAGNOSIS — M9903 Segmental and somatic dysfunction of lumbar region: Secondary | ICD-10-CM | POA: Diagnosis not present

## 2018-07-31 DIAGNOSIS — R0602 Shortness of breath: Secondary | ICD-10-CM | POA: Diagnosis not present

## 2018-07-31 DIAGNOSIS — M5136 Other intervertebral disc degeneration, lumbar region: Secondary | ICD-10-CM | POA: Diagnosis not present

## 2018-07-31 DIAGNOSIS — I10 Essential (primary) hypertension: Secondary | ICD-10-CM | POA: Diagnosis not present

## 2018-07-31 DIAGNOSIS — I340 Nonrheumatic mitral (valve) insufficiency: Secondary | ICD-10-CM | POA: Diagnosis not present

## 2018-07-31 DIAGNOSIS — I2510 Atherosclerotic heart disease of native coronary artery without angina pectoris: Secondary | ICD-10-CM | POA: Diagnosis not present

## 2018-07-31 DIAGNOSIS — M955 Acquired deformity of pelvis: Secondary | ICD-10-CM | POA: Diagnosis not present

## 2018-07-31 DIAGNOSIS — I4891 Unspecified atrial fibrillation: Secondary | ICD-10-CM | POA: Diagnosis not present

## 2018-08-04 DIAGNOSIS — M5136 Other intervertebral disc degeneration, lumbar region: Secondary | ICD-10-CM | POA: Diagnosis not present

## 2018-08-04 DIAGNOSIS — M9905 Segmental and somatic dysfunction of pelvic region: Secondary | ICD-10-CM | POA: Diagnosis not present

## 2018-08-04 DIAGNOSIS — M9903 Segmental and somatic dysfunction of lumbar region: Secondary | ICD-10-CM | POA: Diagnosis not present

## 2018-08-04 DIAGNOSIS — M955 Acquired deformity of pelvis: Secondary | ICD-10-CM | POA: Diagnosis not present

## 2018-08-06 DIAGNOSIS — I340 Nonrheumatic mitral (valve) insufficiency: Secondary | ICD-10-CM | POA: Diagnosis not present

## 2018-08-06 DIAGNOSIS — M9905 Segmental and somatic dysfunction of pelvic region: Secondary | ICD-10-CM | POA: Diagnosis not present

## 2018-08-06 DIAGNOSIS — M5136 Other intervertebral disc degeneration, lumbar region: Secondary | ICD-10-CM | POA: Diagnosis not present

## 2018-08-06 DIAGNOSIS — M9903 Segmental and somatic dysfunction of lumbar region: Secondary | ICD-10-CM | POA: Diagnosis not present

## 2018-08-06 DIAGNOSIS — M955 Acquired deformity of pelvis: Secondary | ICD-10-CM | POA: Diagnosis not present

## 2018-08-06 DIAGNOSIS — R42 Dizziness and giddiness: Secondary | ICD-10-CM | POA: Diagnosis not present

## 2018-08-08 DIAGNOSIS — I10 Essential (primary) hypertension: Secondary | ICD-10-CM | POA: Diagnosis not present

## 2018-08-08 DIAGNOSIS — R55 Syncope and collapse: Secondary | ICD-10-CM | POA: Diagnosis not present

## 2018-08-08 DIAGNOSIS — I4891 Unspecified atrial fibrillation: Secondary | ICD-10-CM | POA: Diagnosis not present

## 2018-08-08 DIAGNOSIS — R0602 Shortness of breath: Secondary | ICD-10-CM | POA: Diagnosis not present

## 2018-08-08 DIAGNOSIS — I2510 Atherosclerotic heart disease of native coronary artery without angina pectoris: Secondary | ICD-10-CM | POA: Diagnosis not present

## 2018-08-11 ENCOUNTER — Ambulatory Visit: Payer: PPO | Admitting: Internal Medicine

## 2018-08-11 DIAGNOSIS — M9903 Segmental and somatic dysfunction of lumbar region: Secondary | ICD-10-CM | POA: Diagnosis not present

## 2018-08-11 DIAGNOSIS — M955 Acquired deformity of pelvis: Secondary | ICD-10-CM | POA: Diagnosis not present

## 2018-08-11 DIAGNOSIS — M5136 Other intervertebral disc degeneration, lumbar region: Secondary | ICD-10-CM | POA: Diagnosis not present

## 2018-08-11 DIAGNOSIS — M9905 Segmental and somatic dysfunction of pelvic region: Secondary | ICD-10-CM | POA: Diagnosis not present

## 2018-08-12 ENCOUNTER — Telehealth: Payer: Self-pay | Admitting: Certified Nurse Midwife

## 2018-08-12 NOTE — Telephone Encounter (Signed)
The patient called to schedule her Annual Exam with a provider, Pt asked to speak with CM, I let pt know that I can send a message back, after verifying pt contact # pt declined to have message sent back. Thank you.

## 2018-08-13 DIAGNOSIS — M955 Acquired deformity of pelvis: Secondary | ICD-10-CM | POA: Diagnosis not present

## 2018-08-13 DIAGNOSIS — M5136 Other intervertebral disc degeneration, lumbar region: Secondary | ICD-10-CM | POA: Diagnosis not present

## 2018-08-13 DIAGNOSIS — M9903 Segmental and somatic dysfunction of lumbar region: Secondary | ICD-10-CM | POA: Diagnosis not present

## 2018-08-13 DIAGNOSIS — M9905 Segmental and somatic dysfunction of pelvic region: Secondary | ICD-10-CM | POA: Diagnosis not present

## 2018-08-17 ENCOUNTER — Encounter: Payer: Self-pay | Admitting: Internal Medicine

## 2018-08-18 ENCOUNTER — Other Ambulatory Visit: Payer: Self-pay | Admitting: Internal Medicine

## 2018-08-18 DIAGNOSIS — M9905 Segmental and somatic dysfunction of pelvic region: Secondary | ICD-10-CM | POA: Diagnosis not present

## 2018-08-18 DIAGNOSIS — M9903 Segmental and somatic dysfunction of lumbar region: Secondary | ICD-10-CM | POA: Diagnosis not present

## 2018-08-18 DIAGNOSIS — M955 Acquired deformity of pelvis: Secondary | ICD-10-CM | POA: Diagnosis not present

## 2018-08-18 DIAGNOSIS — F418 Other specified anxiety disorders: Secondary | ICD-10-CM

## 2018-08-18 DIAGNOSIS — M5136 Other intervertebral disc degeneration, lumbar region: Secondary | ICD-10-CM | POA: Diagnosis not present

## 2018-08-18 MED ORDER — PAROXETINE HCL ER 25 MG PO TB24: 25.0000 mg | ORAL_TABLET | Freq: Every day | ORAL | 1 refills | Status: DC

## 2018-08-25 ENCOUNTER — Telehealth: Payer: Self-pay

## 2018-08-25 NOTE — Telephone Encounter (Signed)
Coronavirus (COVID-19) Are you at risk?  Are you at risk for the Coronavirus (COVID-19)?  To be considered HIGH RISK for Coronavirus (COVID-19), you have to meet the following criteria:  . Traveled to [place], [place], [place], [place] or [place]; or in the [place] to [place], [place], [place], or [place]; and have fever, cough, and shortness of breath within the last 2 weeks of travel OR . Been in close contact with a person diagnosed with COVID-19 within the last 2 weeks and have fever, cough, and shortness of breath . IF YOU DO NOT MEET THESE CRITERIA, YOU ARE CONSIDERED LOW RISK FOR COVID-19.  What to do if you are HIGH RISK for COVID-19?  Marland Kitchen If you are having a medical emergency, call 911. . Seek medical care right away. Before you go to a doctor's office, urgent care or emergency department, call ahead and tell them about your recent travel, contact with someone diagnosed with COVID-19, and your symptoms. You should receive instructions from your physician's office regarding next steps of care.  . When you arrive at healthcare provider, tell the healthcare staff immediately you have returned from visiting [place], [place], [place], [place] or [place]; or traveled in the [place] to [place], [place], [place], or [place]; in the last two weeks or you have been in close contact with a person diagnosed with COVID-19 in the last 2 weeks.   . Tell the health care staff about your symptoms: fever, cough and shortness of breath. . After you have been seen by a medical provider, you will be either: o Tested for (COVID-19) and discharged home on quarantine except to seek medical care if symptoms worsen, and asked to  - Stay home and avoid contact with others until you get your results (4-5 days)  - Avoid travel on public transportation if possible (such as bus, train, or airplane) or o Sent to the Emergency Department by EMS for evaluation, COVID-19 testing, and possible  admission depending on your condition and test results.  What to do if you are LOW RISK for COVID-19?  Reduce your risk of any infection by using the same precautions used for avoiding the common cold or flu:  Marland Kitchen Wash your hands often with soap and warm water for at least 20 seconds.  If soap and water are not readily available, use an alcohol-based hand sanitizer with at least 60% alcohol.  . If coughing or sneezing, cover your mouth and nose by coughing or sneezing into the elbow areas of your shirt or coat, into a tissue or into your sleeve (not your hands). . Avoid shaking hands with others and consider head nods or verbal greetings only. . Avoid touching your eyes, nose, or mouth with unwashed hands.  . Avoid close contact with people who are Ebonique Hallstrom. . Avoid places or events with large numbers of people in one location, like concerts or sporting events. . Carefully consider travel plans you have or are making. . If you are planning any travel outside or inside the [place], visit the CDC's Travelers' Health webpage for the latest health notices. . If you have some symptoms but not all symptoms, continue to monitor at home and seek medical attention if your symptoms worsen. . If you are having a medical emergency, call 911.  08/25/18 SCREENING NEG SLS ADDITIONAL HEALTHCARE OPTIONS FOR PATIENTS  [facility] / e-Visit: [email or web address]         [facility]: [phone]  [facility]: [facility]: [phone]

## 2018-08-26 ENCOUNTER — Other Ambulatory Visit: Payer: Self-pay

## 2018-08-26 ENCOUNTER — Ambulatory Visit (INDEPENDENT_AMBULATORY_CARE_PROVIDER_SITE_OTHER): Payer: PPO | Admitting: Certified Nurse Midwife

## 2018-08-26 ENCOUNTER — Encounter: Payer: Self-pay | Admitting: Certified Nurse Midwife

## 2018-08-26 VITALS — BP 120/80 | HR 78 | Ht 67.0 in | Wt 219.4 lb

## 2018-08-26 DIAGNOSIS — Z01419 Encounter for gynecological examination (general) (routine) without abnormal findings: Secondary | ICD-10-CM

## 2018-08-26 DIAGNOSIS — Z1211 Encounter for screening for malignant neoplasm of colon: Secondary | ICD-10-CM

## 2018-08-26 MED ORDER — PAROXETINE HCL ER 25 MG PO TB24: 25.0000 mg | ORAL_TABLET | Freq: Every day | ORAL | 3 refills | Status: DC

## 2018-08-26 NOTE — Patient Instructions (Addendum)
WE WOULD LOVE TO HEAR FROM YOU!!!!   Thank you [name] for visiting [facility].  Providing our patients with the best experience possible is really important to [place], and we hope that you felt that on your recent visit. The most valuable feedback we get comes from [place]!!    If you receive a survey please take a couple of minutes to let us know how we did.Thank you for continuing to trust [place] with your care.   [facility]   Preventive Care 61-72 Years Old, Female Preventive care refers to visits with your health care provider and lifestyle choices that can promote health and wellness. This includes:  A yearly physical exam. This may also be called an annual well check.  Regular dental visits and eye exams.  Immunizations.  Screening for certain conditions.  Healthy lifestyle choices, such as eating a healthy diet, getting regular exercise, not using drugs or products that contain nicotine and tobacco, and limiting alcohol use. What can I expect for my preventive care visit? Physical exam Your health care provider will check your:  Height and weight. This may be used to calculate body mass index (BMI), which tells if you are at a healthy weight.  Heart rate and blood pressure.  Skin for abnormal spots. Counseling Your health care provider may ask you questions about your:  Alcohol, tobacco, and drug use.  Emotional well-being.  Home and relationship well-being.  Sexual activity.  Eating habits.  Work and work Statistician.  Method of birth control.  Menstrual cycle.  Pregnancy history. What immunizations do I need?  Influenza (flu) vaccine  This is recommended every year. Tetanus, diphtheria, and pertussis (Tdap) vaccine  You may need a Td booster every 10 years. Varicella (chickenpox) vaccine  You may need this if you have not been vaccinated. Zoster (shingles) vaccine  You may need this after age 35.  Measles, mumps, and rubella (MMR) vaccine  You may need at least one dose of MMR if you were born in 1957 or later. You may also need a second dose. Pneumococcal conjugate (PCV13) vaccine  You may need this if you have certain conditions and were not previously vaccinated. Pneumococcal polysaccharide (PPSV23) vaccine  You may need one or two doses if you smoke cigarettes or if you have certain conditions. Meningococcal conjugate (MenACWY) vaccine  You may need this if you have certain conditions. Hepatitis A vaccine  You may need this if you have certain conditions or if you travel or work in places where you may be exposed to hepatitis A. Hepatitis B vaccine  You may need this if you have certain conditions or if you travel or work in places where you may be exposed to hepatitis B. Haemophilus influenzae type b (Hib) vaccine  You may need this if you have certain conditions. Human papillomavirus (HPV) vaccine  If recommended by your health care provider, you may need three doses over 6 months. You may receive vaccines as individual doses or as more than one vaccine together in one shot (combination vaccines). Talk with your health care provider about the risks and benefits of combination vaccines. What tests do I need? Blood tests  Lipid and cholesterol levels. These may be checked every 5 years, or more frequently if you are over 61 years old.  Hepatitis C test.  Hepatitis B test. Screening  Lung cancer screening. You may have this screening every year starting at age 61 if you have a 30-pack-year history  of smoking and currently smoke or have quit within the past 15 years.  Colorectal cancer screening. All adults should have this screening starting at age 61 and continuing until age 95. Your health care provider may recommend screening at age 61 if you are at increased risk. You will have tests every 1-10 years, depending on your results and the type of screening test.   Diabetes screening. This is done by checking your blood sugar (glucose) after you have not eaten for a while (fasting). You may have this done every 1-3 years.  Mammogram. This may be done every 1-2 years. Talk with your health care provider about when you should start having regular mammograms. This may depend on whether you have a family history of breast cancer.  BRCA-related cancer screening. This may be done if you have a family history of breast, ovarian, tubal, or peritoneal cancers.  Pelvic exam and Pap test. This may be done every 3 years starting at age 61. Starting at age 32, this may be done every 5 years if you have a Pap test in combination with an HPV test. Starting at age 61, in combination with an HPV test. Other tests  Sexually transmitted disease (STD) testing.  Bone density scan. This is done to screen for osteoporosis. You may have this scan if you are at high risk for osteoporosis. Follow these instructions at home: Eating and drinking  Eat a diet that includes fresh fruits and vegetables, whole grains, lean protein, and low-fat dairy.  Take vitamin and mineral supplements as recommended by your health care provider.  Do not drink alcohol if: ? Your health care provider tells you not to drink. ? You are pregnant, may be pregnant, or are planning to become pregnant.  If you drink alcohol: ? Limit how much you have to 0-1 drink a day. ? Be aware of how much alcohol is in your drink. In the [place] one drink equals one 12 oz bottle of beer (355 mL), one 5 oz glass of wine (148 mL), or one 1 oz glass of hard liquor (44 mL). Lifestyle  Take daily care of your teeth and gums.  Stay active. Exercise for at least 30 minutes on 5 or more days each week.  Do not use any products that contain nicotine or tobacco, such as cigarettes, e-cigarettes, and chewing tobacco. If you need help quitting, ask your health care provider.  If you are sexually active, practice safe sex. Use a condom or other form of birth control  (contraception) in order to prevent pregnancy and STIs (sexually transmitted infections).  If told by your health care provider, take low-dose aspirin daily starting at age 61. What's next?  Visit your health care provider once a year for a well check visit.  Ask your health care provider how often you should have your eyes and teeth checked.  Stay up to date on all vaccines. This information is not intended to replace advice given to you by your health care provider. Make sure you discuss any questions you have with your health care provider. Document Released: 02/25/2015 Document Revised: 10/10/2017 Document Reviewed: 10/10/2017 Elsevier Patient Education  2020 [employer].

## 2018-08-26 NOTE — Progress Notes (Addendum)
GYNECOLOGY ANNUAL PREVENTATIVE CARE ENCOUNTER NOTE  History:     Kirsten [name] is a 63 y.o. [id number] female here for a routine annual gynecologic exam.  Current complaints: hot flashes that start over th last few months. .   Denies abnormal vaginal bleeding, discharge, pelvic pain, or other gynecologic concerns. She does admit to dyspareunia due to vaginal atrophy. She has taken meds in the past and is not interested in trying anything at this time.    Gynecologic History No LMP recorded. Patient is postmenopausal. Contraception: tubal ligation Last Pap: 01/31/17. Results were: normal with negative HPV Last mammogram: 04/08/18. Results were: normal  Obstetric History OB History  Gravida Para Term Preterm AB Living  3 3 3     3   SAB TAB Ectopic Multiple Live Births          3    # Outcome Date GA Lbr Len/2nd Weight Sex Delivery Anes PTL Lv  3 Term 1988   8 lb 10 oz (3.912 kg) M CS-LTranv   LIV  2 Term 1986   9 lb 4 oz (4.196 kg) F CS-LTranv   LIV  1 Term 1983   8 lb 13 oz (3.997 kg) F CS-LTranv   LIV     Complications: Breech birth    Past Medical History:  Diagnosis Date  . A-fib ([place])   . Anxiety disorder   . Asthma   . Dyspnea   . Gastroschisis    umphalocele, rotated organs abd repair until age 62  . GERD (gastroesophageal reflux disease)   . HTN (hypertension)   . Hyperlipidemia   . IBS (irritable bowel syndrome)   . Morbid obesity ([place])    Target wt - 185  for BMI < 30  . Obesity   . OSA on CPAP   . Pulmonary fibrosis ([place])   . SBO (small bowel obstruction) ([place])    Resolved with NG/Bowel rest around 2009  . Sleep apnea   . Type II or unspecified type diabetes mellitus without mention of complication, not stated as uncontrolled     Past Surgical History:  Procedure Laterality Date  . BREAST EXCISIONAL BIOPSY Left 03/19/2012   neg  . CESAREAN SECTION    . CHOLECYSTECTOMY  1992  . Newborn Surgery - GI - ORGANS OUTSIDE ABDOMEN    . Small Bowel Repair    .  TUBAL LIGATION  1988    Current Outpatient Medications on File Prior to Visit  Medication Sig Dispense Refill  . amiodarone (PACERONE) 200 MG tablet Take 200 mg daily by mouth.    Marland Kitchen apixaban (ELIQUIS) 5 MG TABS tablet Take 1 tablet (5 mg total) by mouth 2 (two) times daily. 180 tablet 1  . atorvastatin (LIPITOR) 40 MG tablet TAKE 1 TABLET(40 MG) BY MOUTH DAILY 90 tablet 1  . glucose blood (ONE TOUCH ULTRA TEST) test strip Use as instructed 100 each 12  . hydrochlorothiazide (HYDRODIURIL) 25 MG tablet Take 1 tablet (25 mg total) by mouth daily. 90 tablet 1  . levothyroxine (SYNTHROID) 100 MCG tablet TAKE 1 TABLET(100 MCG) BY MOUTH DAILY 90 tablet 1  . metFORMIN (GLUCOPHAGE) 1000 MG tablet TAKE 1 TABLET(1000 MG) BY MOUTH TWICE DAILY WITH A MEAL. FOLLOW-UP APPOINTMENT DUE IN JULY 180 tablet 1  . metoprolol succinate (TOPROL-XL) 50 MG 24 hr tablet Take 50 mg by mouth daily.     Marland Kitchen zolpidem (AMBIEN) 5 MG tablet Take 1 tablet (5 mg total) by mouth at bedtime as needed  for sleep. 90 tablet 1  . PARoxetine (PAXIL CR) 25 MG 24 hr tablet Take 1 tablet (25 mg total) by mouth daily. (Patient not taking: Reported on 08/26/2018) 90 tablet 1   No current facility-administered medications on file prior to visit.     Allergies  Allergen Reactions  . Factive [Gemifloxacin Mesylate] Rash  . Crestor [Rosuvastatin]     GI upset  . Sulfonamide Derivatives     REACTION: rash  . Gemifloxacin Rash  . Penicillins Hives and Rash    Has patient had a PCN reaction causing immediate rash, facial/tongue/throat swelling, SOB or lightheadedness with hypotension: No Has patient had a PCN reaction causing severe rash involving mucus membranes or skin necrosis: No Has patient had a PCN reaction that required hospitalization No Has patient had a PCN reaction occurring within the last 10 years: No If all of the above answers are "NO", then may proceed with Cephalosporin use.     Social History:  reports that she has  never smoked. She has never used smokeless tobacco. She reports current alcohol use of about 2.0 standard drinks of alcohol per week. She reports that she does not use drugs.  She works out 5 x wk for 30 min. (walking) She does not smoke, drink, or use drugs.  Family History  Problem Relation Age of Onset  . Lymphoma Mother   . Diabetes type II Father   . Colon cancer Father   . Diabetes type I Son   . Diabetes Maternal Grandmother   . Goiter Neg Hx   . Breast cancer Neg Hx   . Ovarian cancer Neg Hx     The following portions of the patient's history were reviewed and updated as appropriate: allergies, current medications, past family history, past medical history, past social history, past surgical history and problem list.  Review of Systems Pertinent items noted in HPI and remainder of comprehensive ROS otherwise negative.  Physical Exam:  BP 120/80   Pulse 78   Ht 5' 7"  (1.702 m)   Wt 219 lb 7 oz (99.5 kg)   BMI 34.37 kg/m  CONSTITUTIONAL: Well-developed, well-nourished, obese female in no acute distress.  HENT:  Normocephalic, atraumatic, External right and left ear normal. Oropharynx is clear and moist EYES: Conjunctivae and EOM are normal. Pupils are equal, round, and reactive to light. No scleral icterus.  NECK: Normal range of motion, supple, no masses.  Normal thyroid.  SKIN: Skin is warm and dry. No rash noted. Not diaphoretic. No erythema. No pallor. MUSCULOSKELETAL: Normal range of motion. No tenderness.  No cyanosis, clubbing, or edema.  2+ distal pulses. NEUROLOGIC: Alert and oriented to person, place, and time. Normal reflexes, muscle tone coordination. No cranial nerve deficit noted. PSYCHIATRIC: Normal mood and affect. Normal behavior. Normal judgment and thought content. CARDIOVASCULAR: Normal heart rate noted, regular rhythm RESPIRATORY: Clear to auscultation bilaterally. Effort and breath sounds normal, no problems with respiration noted. BREASTS: Symmetric  in size. No masses, skin changes, nipple drainage, or lymphadenopathy. ABDOMEN: Soft, normal bowel sounds, no distention noted.  No tenderness, rebound or guarding. Multiple scars from surgery. PELVIC: Normal appearing external genitalia; normal appearing vaginal atrophy , normal cervix.  No abnormal discharge noted.  Pap smear not indicated  Normal uterine size, no other palpable masses, no uterine or adnexal tenderness. Bi manual difficult due to body habitus and pt tolerance of exam.    Assessment and Plan:  Annual Well Women GYN exam  Pap not indicated until 2023  Mammogram completed Labs none - completed by PCP Colonoscopy ordered  Discussed discontinuation of Paxil and the potential that it was helping with hot flashes. She would like to start it back. Order placed.  Discussed use of medication to help with pain for next pap smear. She agrees to plan.  Routine preventative health maintenance measures emphasized. Please refer to After Visit Summary for other counseling recommendations.      Philip Aspen, CNM

## 2018-09-03 ENCOUNTER — Other Ambulatory Visit: Payer: Self-pay | Admitting: Internal Medicine

## 2018-09-03 DIAGNOSIS — E785 Hyperlipidemia, unspecified: Secondary | ICD-10-CM

## 2018-09-03 MED ORDER — ATORVASTATIN CALCIUM 40 MG PO TABS: ORAL_TABLET | ORAL | 1 refills | Status: DC

## 2018-09-12 ENCOUNTER — Telehealth: Payer: Self-pay

## 2018-09-12 NOTE — Telephone Encounter (Signed)
rf rq for zolpidem 5 mg tablets.   Per PCP, pt is due for a follow up on or around 09/18/2018.   lvm for pt to call and make an appt.

## 2018-09-12 NOTE — Telephone Encounter (Signed)
Pt scheduled  

## 2018-09-18 ENCOUNTER — Other Ambulatory Visit: Payer: Self-pay

## 2018-09-18 ENCOUNTER — Ambulatory Visit (INDEPENDENT_AMBULATORY_CARE_PROVIDER_SITE_OTHER): Payer: PPO | Admitting: Internal Medicine

## 2018-09-18 ENCOUNTER — Encounter: Payer: Self-pay | Admitting: Internal Medicine

## 2018-09-18 ENCOUNTER — Other Ambulatory Visit (INDEPENDENT_AMBULATORY_CARE_PROVIDER_SITE_OTHER): Payer: PPO

## 2018-09-18 VITALS — BP 120/82 | HR 68 | Temp 98.2°F | Ht 67.0 in | Wt 217.0 lb

## 2018-09-18 DIAGNOSIS — I10 Essential (primary) hypertension: Secondary | ICD-10-CM

## 2018-09-18 DIAGNOSIS — E876 Hypokalemia: Secondary | ICD-10-CM | POA: Diagnosis not present

## 2018-09-18 DIAGNOSIS — E038 Other specified hypothyroidism: Secondary | ICD-10-CM

## 2018-09-18 DIAGNOSIS — T502X5A Adverse effect of carbonic-anhydrase inhibitors, benzothiadiazides and other diuretics, initial encounter: Secondary | ICD-10-CM | POA: Diagnosis not present

## 2018-09-18 DIAGNOSIS — E032 Hypothyroidism due to medicaments and other exogenous substances: Secondary | ICD-10-CM

## 2018-09-18 DIAGNOSIS — E538 Deficiency of other specified B group vitamins: Secondary | ICD-10-CM | POA: Diagnosis not present

## 2018-09-18 DIAGNOSIS — I480 Paroxysmal atrial fibrillation: Secondary | ICD-10-CM

## 2018-09-18 DIAGNOSIS — E781 Pure hyperglyceridemia: Secondary | ICD-10-CM

## 2018-09-18 DIAGNOSIS — E118 Type 2 diabetes mellitus with unspecified complications: Secondary | ICD-10-CM

## 2018-09-18 LAB — URINALYSIS, ROUTINE W REFLEX MICROSCOPIC
Bilirubin Urine: NEGATIVE
Hgb urine dipstick: NEGATIVE
Ketones, ur: NEGATIVE
Nitrite: NEGATIVE
RBC / HPF: NONE SEEN (ref 0–?)
Specific Gravity, Urine: 1.02 (ref 1.000–1.030)
Total Protein, Urine: NEGATIVE
Urine Glucose: NEGATIVE
Urobilinogen, UA: 0.2 (ref 0.0–1.0)
pH: 5.5 (ref 5.0–8.0)

## 2018-09-18 LAB — BASIC METABOLIC PANEL
BUN: 20 mg/dL (ref 6–23)
CO2: 32 mEq/L (ref 19–32)
Calcium: 10.2 mg/dL (ref 8.4–10.5)
Chloride: 99 mEq/L (ref 96–112)
Creatinine, Ser: 0.76 mg/dL (ref 0.40–1.20)
GFR: 77.33 mL/min (ref >60.00)
Glucose, Bld: 152 mg/dL — ABNORMAL HIGH (ref 70–99)
Potassium: 3.2 mEq/L — ABNORMAL LOW (ref 3.5–5.1)
Sodium: 141 mEq/L (ref 135–145)

## 2018-09-18 LAB — MICROALBUMIN / CREATININE URINE RATIO
Creatinine,U: 82.5 mg/dL
Microalb Creat Ratio: 1.4 mg/g (ref 0.0–30.0)
Microalb, Ur: 1.2 mg/dL (ref 0.0–1.9)

## 2018-09-18 LAB — TRIGLYCERIDES: Triglycerides: 153 mg/dL — ABNORMAL HIGH (ref 0.0–149.0)

## 2018-09-18 LAB — FOLATE: Folate: 8.7 ng/mL (ref >5.9)

## 2018-09-18 LAB — HEMOGLOBIN A1C: Hgb A1c MFr Bld: 7 % — ABNORMAL HIGH (ref 4.6–6.5)

## 2018-09-18 LAB — TSH: TSH: 2.66 u[IU]/mL (ref 0.35–4.50)

## 2018-09-18 MED ORDER — SPIRONOLACTONE 25 MG PO TABS: 25.0000 mg | ORAL_TABLET | Freq: Every day | ORAL | 0 refills | Status: DC

## 2018-09-18 MED ORDER — CYANOCOBALAMIN 1000 MCG/ML IJ SOLN
1000.0000 ug | Freq: Once | INTRAMUSCULAR | Status: AC
  Administered 2018-09-18: 1000 ug via INTRAMUSCULAR

## 2018-09-18 MED ORDER — APIXABAN 5 MG PO TABS: 5.0000 mg | ORAL_TABLET | Freq: Two times a day (BID) | ORAL | 1 refills | Status: DC

## 2018-09-18 NOTE — Progress Notes (Signed)
Subjective:  Patient ID: Kirsten [name], female    DOB: 06-24-1955  Age: 63 y.o. MRN: [id number]  CC: Hypertension, Hypothyroidism, Diabetes, and Hyperlipidemia   HPI Kirsten [name] presents for f/up - She feels in her usual state of health.  She has not been very active recently but denies any recent episodes of palpitations, shortness of breath, edema, fatigue, dizziness, or lightheadedness.  Outpatient Medications Prior to Visit  Medication Sig Dispense Refill   amiodarone (PACERONE) 200 MG tablet Take 200 mg daily by mouth.     atorvastatin (LIPITOR) 40 MG tablet TAKE 1 TABLET(40 MG) BY MOUTH DAILY 90 tablet 1   glucose blood (ONE TOUCH ULTRA TEST) test strip Use as instructed 100 each 12   levothyroxine (SYNTHROID) 100 MCG tablet TAKE 1 TABLET(100 MCG) BY MOUTH DAILY 90 tablet 1   metFORMIN (GLUCOPHAGE) 1000 MG tablet TAKE 1 TABLET(1000 MG) BY MOUTH TWICE DAILY WITH A MEAL. FOLLOW-UP APPOINTMENT DUE IN JULY 180 tablet 1   metoprolol succinate (TOPROL-XL) 50 MG 24 hr tablet Take 50 mg by mouth daily.      PARoxetine (PAXIL CR) 25 MG 24 hr tablet Take 1 tablet (25 mg total) by mouth daily. 30 tablet 3   zolpidem (AMBIEN) 5 MG tablet Take 1 tablet (5 mg total) by mouth at bedtime as needed for sleep. 90 tablet 1   apixaban (ELIQUIS) 5 MG TABS tablet Take 1 tablet (5 mg total) by mouth 2 (two) times daily. 180 tablet 1   hydrochlorothiazide (HYDRODIURIL) 25 MG tablet Take 1 tablet (25 mg total) by mouth daily. 90 tablet 1   PARoxetine (PAXIL CR) 25 MG 24 hr tablet Take 1 tablet (25 mg total) by mouth daily. (Patient not taking: Reported on 08/26/2018) 90 tablet 1   No facility-administered medications prior to visit.     ROS Review of Systems  Constitutional: Negative.  Negative for appetite change, diaphoresis, fatigue and unexpected weight change.  HENT: Negative.   Eyes: Negative for visual disturbance.  Respiratory: Negative for cough, chest tightness, shortness of  breath and wheezing.   Cardiovascular: Negative for chest pain, palpitations and leg swelling.  Gastrointestinal: Negative for abdominal pain.  Endocrine: Negative.  Negative for cold intolerance, heat intolerance, polydipsia, polyphagia and polyuria.  Genitourinary: Negative.  Negative for difficulty urinating.  Musculoskeletal: Negative for arthralgias and myalgias.  Skin: Negative.   Neurological: Negative.  Negative for dizziness, weakness and light-headedness.  Hematological: Negative for adenopathy. Does not bruise/bleed easily.  Psychiatric/Behavioral: Negative.     Objective:  BP 120/82 (BP Location: Left Arm, Patient Position: Sitting, Cuff Size: Large)    Pulse 68    Temp 98.2 F (36.8 C) (Oral)    Ht 5' 7"  (1.702 m)    Wt 217 lb (98.4 kg)    SpO2 95%    BMI 33.99 kg/m   BP Readings from Last 3 Encounters:  09/18/18 120/82  08/26/18 120/80  03/20/18 136/86    Wt Readings from Last 3 Encounters:  09/18/18 217 lb (98.4 kg)  08/26/18 219 lb 7 oz (99.5 kg)  03/20/18 214 lb 12 oz (97.4 kg)    Physical Exam Vitals signs reviewed.  Constitutional:      Appearance: She is obese. She is not ill-appearing or diaphoretic.  HENT:     Nose: Nose normal.     Mouth/Throat:     Mouth: Mucous membranes are moist.  Eyes:     General: No scleral icterus.    Conjunctiva/sclera: Conjunctivae  normal.  Neck:     Musculoskeletal: Normal range of motion. No neck rigidity or muscular tenderness.     Thyroid: No thyroid mass, thyromegaly or thyroid tenderness.  Cardiovascular:     Rate and Rhythm: Normal rate and regular rhythm.     Heart sounds: No murmur.  Pulmonary:     Effort: Pulmonary effort is normal. No respiratory distress.     Breath sounds: No stridor. No wheezing, rhonchi or rales.  Abdominal:     General: Abdomen is protuberant. Bowel sounds are normal.     Palpations: There is no hepatomegaly or splenomegaly.     Tenderness: There is no abdominal tenderness.    Musculoskeletal: Normal range of motion.     Right lower leg: No edema.     Left lower leg: No edema.  Lymphadenopathy:     Cervical: No cervical adenopathy.  Skin:    General: Skin is warm and dry.     Coloration: Skin is not pale.  Neurological:     General: No focal deficit present.     Mental Status: She is alert.  Psychiatric:        Mood and Affect: Mood normal.        Behavior: Behavior normal.     Lab Results  Component Value Date   WBC 10.1 03/20/2018   HGB 13.7 03/20/2018   HCT 41.1 03/20/2018   PLT 314.0 03/20/2018   GLUCOSE 152 (H) 09/18/2018   CHOL 121 03/20/2018   TRIG 153.0 (H) 09/18/2018   HDL 40.70 03/20/2018   LDLDIRECT 56.0 03/20/2018   LDLCALC SEE COMMENT 11/26/2013   ALT 28 03/20/2018   AST 20 03/20/2018   NA 141 09/18/2018   K 3.2 (L) 09/18/2018   CL 99 09/18/2018   CREATININE 0.76 09/18/2018   BUN 20 09/18/2018   CO2 32 09/18/2018   TSH 2.66 09/18/2018   INR 1.1 11/27/2013   HGBA1C 7.0 (H) 09/18/2018   MICROALBUR 1.2 09/18/2018    Dg Bone Density  Result Date: 04/08/2018 EXAM: DUAL X-RAY ABSORPTIOMETRY (DXA) FOR BONE MINERAL DENSITY IMPRESSION: Dear Dr Ronnald Ramp, [name] completed a FRAX assessment on 04/08/2018 using the [place] (analysis version: 14.10) manufactured by [employer]. The following summarizes the results of our evaluation. PATIENT BIOGRAPHICAL: Name: Kirsten, [name] Patient ID: [id number] Birth Date: Oct 20, 1955 Height:    67.0 in. Gender:     Female    [age]       Weight:    216.4 lbs. Ethnicity:  White                            Exam Date: 04/08/2018 FRAX* RESULTS:  (version: 3.5) 10-year Probability of Fracture1 Major Osteoporotic Fracture2 Hip Fracture 14.5% 1.6% Population: [place] (Caucasian) Risk Factors: History of Fracture (Adult) Based on Femur (Left) Neck BMD 1 -The 10-year probability of fracture may be lower than reported if the patient has received treatment. 2 -Major  Osteoporotic Fracture: Clinical Spine, Forearm, Hip or Shoulder *FRAX is a Materials engineer of the [employer] for Metabolic Bone Disease, a [place] (WHO) [facility]. ASSESSMENT: The probability of a major osteoporotic fracture is 14.5% within the next ten years. The probability of a hip fracture is 1.6% within the next ten years. . Technologist: [employer] PATIENT BIOGRAPHICAL: Name: Hiba, Garry Patient ID: [id number] Birth Date: 1955-11-19  Height: 67.0 in. Gender: Female Exam Date: 04/08/2018 Weight: 216.4 lbs. Indications: Asthma, Caucasian, COPD, Diabetic, History of Fracture (Adult), Hypothyroid, Osteopenia, Postmenopausal Fractures: coccyx, Right elbow, Right forearm, Right humerus Treatments: Calcium, Levothyroxine, Metformin, Multi-Vitamin ASSESSMENT: The BMD measured at Femur Neck Left is 0.789 g/cm2 with a T-score of -1.8. This patient is considered osteopenic according to [place] [facility]) criteria. The quality of the scan is good. L1 was excluded due to degenerative changes. Site Region Measured Measured WHO Young Adult BMD Date       Age      Classification T-score AP Spine L2-L4 04/08/2018 60.6 Osteopenia -1.4 1.042 g/cm2 DualFemur Neck Left 04/08/2018 60.6 Osteopenia -1.8 0.789 g/cm2 World Health Organization [facility]) criteria for post-menopausal, Caucasian Women: Normal:       T-score at or above -1 SD Osteopenia:   T-score between -1 and -2.5 SD Osteoporosis: T-score at or below -2.5 SD RECOMMENDATIONS: 1. All patients should optimize calcium and vitamin D intake. 2. Consider FDA-approved medical therapies in postmenopausal women and men aged 46 years and older, based on the following: a. A hip or vertebral(clinical or morphometric) fracture b. T-score < -2.5 at the femoral neck or spine after appropriate evaluation to exclude secondary causes c. Low bone mass (T-score between -1.0 and -2.5 at the femoral neck or spine) and a 10-year  probability of a hip fracture > 3% or a 10-year probability of a major osteoporosis-related fracture > 20% based on the [place]-adapted WHO algorithm d. Clinician judgment and/or patient preferences may indicate treatment for people with 10-year fracture probabilities above or below these levels FOLLOW-UP: People with diagnosed cases of osteoporosis or at high risk for fracture should have regular bone mineral density tests. For patients eligible for Medicare, routine testing is allowed once every 2 years. The testing frequency can be increased to one year for patients who have rapidly progressing disease, those who are receiving or discontinuing medical therapy to restore bone mass, or have additional risk factors. I have reviewed this report, and agree with the above findings. [facility] Radiology Electronically Signed   By: Lowella Grip III M.D.   On: 04/08/2018 14:37   Mm 3d Screen Breast Bilateral  Result Date: 04/08/2018 CLINICAL DATA:  Screening. EXAM: DIGITAL SCREENING BILATERAL MAMMOGRAM WITH TOMO AND CAD COMPARISON:  Previous exam(s). ACR Breast Density Category b: There are scattered areas of fibroglandular density. FINDINGS: There are no findings suspicious for malignancy. Images were processed with CAD. IMPRESSION: No mammographic evidence of malignancy. A result letter of this screening mammogram will be mailed directly to the patient. RECOMMENDATION: Screening mammogram in one year. (Code:[id number]-B-01Y) BI-RADS CATEGORY  1: Negative. Electronically Signed   By: Abelardo Diesel M.D.   On: 04/08/2018 14:22    Assessment & Plan:   Eisley was seen today for hypertension, hypothyroidism, diabetes and hyperlipidemia.  Diagnoses and all orders for this visit:  Essential hypertension, benign- Her blood pressure is adequately well controlled but she has developed hypokalemia.  I have asked her to switch from a thiazide diuretic to a potassium sparing diuretic. -     Basic metabolic panel; Future -      Urinalysis, Routine w reflex microscopic; Future -     spironolactone (ALDACTONE) 25 MG tablet; Take 1 tablet (25 mg total) by mouth daily.  Hypothyroidism due to medication- Her TSH is in the normal range.  She will remain on the current dose of levothyroxine. -     TSH; Future  Type II diabetes mellitus  with manifestations ([place])- Her A1c is at 7.0%.  Her blood sugars are adequately well controlled. -     Hemoglobin A1c; Future -     Microalbumin / creatinine urine ratio; Future -     HM Diabetes Foot Exam  Hypertriglyceridemia-improvement noted. -     Triglycerides; Future  Vitamin B 12 deficiency -     Folate; Future -     cyanocobalamin ((VITAMIN B-12)) injection 1,000 mcg  Paroxysmal atrial fibrillation ([place])- She is maintaining sinus rhythm.  Will continue anticoagulation with the DOAC. -     apixaban (ELIQUIS) 5 MG TABS tablet; Take 1 tablet (5 mg total) by mouth 2 (two) times daily.  Other specified hypothyroidism  Diuretic-induced hypokalemia -     spironolactone (ALDACTONE) 25 MG tablet; Take 1 tablet (25 mg total) by mouth daily.   I have discontinued Neria A. Favor's hydrochlorothiazide. I am also having her start on spironolactone. Additionally, I am having her maintain her glucose blood, metoprolol succinate, amiodarone, zolpidem, levothyroxine, metFORMIN, PARoxetine, atorvastatin, and apixaban. We administered cyanocobalamin.  Meds ordered this encounter  Medications   apixaban (ELIQUIS) 5 MG TABS tablet    Sig: Take 1 tablet (5 mg total) by mouth 2 (two) times daily.    Dispense:  180 tablet    Refill:  1   cyanocobalamin ((VITAMIN B-12)) injection 1,000 mcg   spironolactone (ALDACTONE) 25 MG tablet    Sig: Take 1 tablet (25 mg total) by mouth daily.    Dispense:  90 tablet    Refill:  0     Follow-up: Return in about 4 months (around 01/18/2019).  [name], MD

## 2018-09-18 NOTE — Patient Instructions (Signed)
Type 2 Diabetes Mellitus, Diagnosis, Adult Type 2 diabetes (type 2 diabetes mellitus) is a long-term (chronic) disease. In type 2 diabetes, one or both of these problems may be present:  The pancreas does not make enough of a hormone called insulin.  Cells in the body do not respond properly to insulin that the body makes (insulin resistance). Normally, insulin allows blood sugar (glucose) to enter cells in the body. The cells use glucose for energy. Insulin resistance or lack of insulin causes excess glucose to build up in the blood instead of going into cells. As a result, high blood glucose (hyperglycemia) develops. What increases the risk? The following factors may make you more likely to develop type 2 diabetes:  Having a family member with type 2 diabetes.  Being overweight or obese.  Having an inactive (sedentary) lifestyle.  Having been diagnosed with insulin resistance.  Having a history of prediabetes, gestational diabetes, or polycystic ovary syndrome (PCOS).  Being of American-Indian, African-American, Hispanic/Latino, or Asian/Pacific Islander descent. What are the signs or symptoms? In the early stage of this condition, you may not have symptoms. Symptoms develop slowly and may include:  Increased thirst (polydipsia).  Increased hunger(polyphagia).  Increased urination (polyuria).  Increased urination during the night (nocturia).  Unexplained weight loss.  Frequent infections that keep coming back (recurring).  Fatigue.  Weakness.  Vision changes, such as blurry vision.  Cuts or bruises that are slow to heal.  Tingling or numbness in the hands or feet.  Dark patches on the skin (acanthosis nigricans). How is this diagnosed? This condition is diagnosed based on your symptoms, your medical history, a physical exam, and your blood glucose level. Your blood glucose may be checked with one or more of the following blood tests:  A fasting blood glucose (FBG)  test. You will not be allowed to eat (you will fast) for 8 hours or longer before a blood sample is taken.  A random blood glucose test. This test checks blood glucose at any time of day regardless of when you ate.  An A1c (hemoglobin A1c) blood test. This test provides information about blood glucose control over the previous 2-3 months.  An oral glucose tolerance test (OGTT). This test measures your blood glucose at two times: ? After fasting. This is your baseline blood glucose level. ? Two hours after drinking a beverage that contains glucose. You may be diagnosed with type 2 diabetes if:  Your FBG level is 126 mg/dL (7.0 mmol/L) or higher.  Your random blood glucose level is 200 mg/dL (11.1 mmol/L) or higher.  Your A1c level is 6.5% or higher.  Your OGTT result is higher than 200 mg/dL (11.1 mmol/L). These blood tests may be repeated to confirm your diagnosis. How is this treated? Your treatment may be managed by a specialist called an endocrinologist. Type 2 diabetes may be treated by following instructions from your health care provider about:  Making diet and lifestyle changes. This may include: ? Following an individualized nutrition plan that is developed by a diet and nutrition specialist (registered dietitian). ? Exercising regularly. ? Finding ways to manage stress.  Checking your blood glucose level as often as told.  Taking diabetes medicines or insulin daily. This helps to keep your blood glucose levels in the healthy range. ? If you use insulin, you may need to adjust the dosage depending on how physically active you are and what foods you eat. Your health care provider will tell you how to adjust your dosage.    Taking medicines to help prevent complications from diabetes, such as: ? Aspirin. ? Medicine to lower cholesterol. ? Medicine to control blood pressure. Your health care provider will set individualized treatment goals for you. Your goals will be based on  your age, other medical conditions you have, and how you respond to diabetes treatment. Generally, the goal of treatment is to maintain the following blood glucose levels:  Before meals (preprandial): 80-130 mg/dL (4.4-7.2 mmol/L).  After meals (postprandial): below 180 mg/dL (10 mmol/L).  A1c level: less than 7%. Follow these instructions at home: Questions to ask your health care provider  Consider asking the following questions: ? Do I need to meet with a diabetes educator? ? Where can I find a support group for people with diabetes? ? What equipment will I need to manage my diabetes at home? ? What diabetes medicines do I need, and when should I take them? ? How often do I need to check my blood glucose? ? What number can I call if I have questions? ? When is my next appointment? General instructions  Take over-the-counter and prescription medicines only as told by your health care provider.  Keep all follow-up visits as told by your health care provider. This is important.  For more information about diabetes, visit: ? American Diabetes Association (ADA): www.diabetes.org ? American Association of Diabetes Educators (AADE): www.diabeteseducator.org Contact a health care provider if:  Your blood glucose is at or above 240 mg/dL (13.3 mmol/L) for 2 days in a row.  You have been sick or have had a fever for 2 days or longer, and you are not getting better.  You have any of the following problems for more than 6 hours: ? You cannot eat or drink. ? You have nausea and vomiting. ? You have diarrhea. Get help right away if:  Your blood glucose is lower than 54 mg/dL (3.0 mmol/L).  You become confused or you have trouble thinking clearly.  You have difficulty breathing.  You have moderate or large ketone levels in your urine. Summary  Type 2 diabetes (type 2 diabetes mellitus) is a long-term (chronic) disease. In type 2 diabetes, the pancreas does not make enough of a  hormone called insulin, or cells in the body do not respond properly to insulin that the body makes (insulin resistance).  This condition is treated by making diet and lifestyle changes and taking diabetes medicines or insulin.  Your health care provider will set individualized treatment goals for you. Your goals will be based on your age, other medical conditions you have, and how you respond to diabetes treatment.  Keep all follow-up visits as told by your health care provider. This is important. This information is not intended to replace advice given to you by your health care provider. Make sure you discuss any questions you have with your health care provider. Document Released: 01/29/2005 Document Revised: 03/29/2017 Document Reviewed: 03/04/2015 Elsevier Patient Education  2020 [employer]  

## 2018-09-22 ENCOUNTER — Other Ambulatory Visit: Payer: Self-pay | Admitting: Certified Nurse Midwife

## 2018-09-22 MED ORDER — PAROXETINE HCL ER 25 MG PO TB24: 25.0000 mg | ORAL_TABLET | Freq: Every day | ORAL | 9 refills | Status: DC

## 2018-09-25 DIAGNOSIS — G4733 Obstructive sleep apnea (adult) (pediatric): Secondary | ICD-10-CM | POA: Diagnosis not present

## 2018-09-30 ENCOUNTER — Other Ambulatory Visit: Payer: Self-pay | Admitting: Internal Medicine

## 2018-09-30 DIAGNOSIS — F5105 Insomnia due to other mental disorder: Secondary | ICD-10-CM

## 2018-09-30 DIAGNOSIS — F409 Phobic anxiety disorder, unspecified: Secondary | ICD-10-CM

## 2018-09-30 MED ORDER — ZOLPIDEM TARTRATE 5 MG PO TABS: 5.0000 mg | ORAL_TABLET | Freq: Every evening | ORAL | 1 refills | Status: DC | PRN

## 2018-10-30 ENCOUNTER — Ambulatory Visit: Payer: PPO | Admitting: Internal Medicine

## 2018-10-30 ENCOUNTER — Ambulatory Visit (INDEPENDENT_AMBULATORY_CARE_PROVIDER_SITE_OTHER): Payer: PPO | Admitting: Internal Medicine

## 2018-10-30 ENCOUNTER — Encounter: Payer: Self-pay | Admitting: Internal Medicine

## 2018-10-30 ENCOUNTER — Other Ambulatory Visit: Payer: Self-pay

## 2018-10-30 VITALS — BP 128/76 | HR 69 | Temp 97.8°F | Ht 67.0 in | Wt 213.6 lb

## 2018-10-30 DIAGNOSIS — G4733 Obstructive sleep apnea (adult) (pediatric): Secondary | ICD-10-CM | POA: Diagnosis not present

## 2018-10-30 DIAGNOSIS — J4530 Mild persistent asthma, uncomplicated: Secondary | ICD-10-CM

## 2018-10-30 NOTE — Patient Instructions (Signed)
Continue to use cpap every night for the whole night.  Try to walk at least 15 minutes per day.

## 2018-10-30 NOTE — Progress Notes (Signed)
* [facility]     Assessment and Plan:  Obstructive sleep apnea. - Review of download shows that patient is doing well with CPAP, continue use of CPAP every night.  Elevated right diaphragm. Dyspnea on exertion. Asthma. 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 - No intervention will be necessary for it at this time, she is recommended to continue trying to increase her physical activity and weight loss. - Patient asthma symptoms are well controlled, she currently does not require an inhaler.  Atrial fibrillation. - Sleep apnea can contribute to above condition, therefore treatment of sleep apnea is an important part of management.  Patient currently is following up with Dr. Humphrey Rolls.  Return in about 1 year (around 10/30/2019).    Date: 10/30/2018  MRN# [id number] Kirsten [name] 25-Oct-1955   Kirsten [name] is a 63 y.o. old female seen in follow up for chief complaint of  Chief Complaint  Patient presents with  . Follow-up    fromer Dr. Elsworth Soho pt- wearing cpap avg 8-9hr nightly- feels pressure & mask are okay. XIP:JASNK     HPI:  Kirsten [name] is a 63 y.o. female with a history of asthma and COPD. She has a history of hospital admission in March of 2019 for apparent pulmonary fibrosis, as well as hospitalization in February 2018 for asthma exacerbation and influenza.  She notes that she was diagnosed with asthma several years ago after she had H1N1 flu. She does not use any inhalers currently, and has not had a rescue inhaler in years.  She uses cpap every night, she takes occasional [place].  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  She feels that in general that her breathing has been well, she notes that her breathing is worse when she gets ill.  She walks for about 15  minutes on most days.  She cleans her machine with a so clean daily.  She follows with her cardiologist, Dr. Humphrey Rolls.   **CPAP download 09/30/2018-10/29/2018>> raw data personally reviewed, usage greater than 4 hours is 28/30 days.  Average usage on days used is 7 hours 28 minutes.  Set pressure is 8, leaks are within normal limits, residual AHI is 0.8.  Overall this shows very good compliance with excellent control obstructive sleep apnea. **Chest x-ray 04/23/2017>> images personally viewed, AP view, hyperinflation suggestive of COPD.  05/01/2017 Hospital Follow Up: Pt. Presents for hospital follow up. She was admitted to the hospital from 04/23/2017-04/26/2017  for acute respiratory Failure with hypoxia 2/2 asthma exacerbation caused by bronchitis. Viral testing was negative. She was treated with IV steroids, scheduled duo nebs, Pulmicort nebs, and also treated with empiric Zithromax.  Chest x-ray on admission showed some bronchitic changes, chest CT showed scarring/fibrosis on the base of the lungs but no other acute pneumonia or further process.  She clinically improved and was discharged on oral prednisone taper and Zithromax.  She was ambulated on room air prior to discharge and did not qualify for home oxygen.  The patient is here for follow-up. She states she has been compliant with her prednisone and her antibiotic.She states she is feeling better. She states she has no further discolored secretions. Her cough has resolved. She was compliant with he antibiotics and prednisone. She states she has been wearing her CPAP machine every night.She did  not bring her sim card with her today.She does not take any maintenance medications for her asthma. She denies fever, chest pain, orthopnea or hemoptysis.   **PSG 2010- AHI of 80 events per hour. Maintained onauto CPAP  **Down Load 04/02/2017-05/01/2017 AirSense 10 AutoSet Set pressure of 8 cm H2O Usage 30/30 days or 100% > 4 hours 27/30 days or 90% < 4  hours 3/31/ days or 10% Average Usage days used>> 7 hours and 7 minutes AHI=1.7    Medication:    Current Outpatient Medications:  .  amiodarone (PACERONE) 200 MG tablet, Take 200 mg daily by mouth., Disp: , Rfl:  .  apixaban (ELIQUIS) 5 MG TABS tablet, Take 1 tablet (5 mg total) by mouth 2 (two) times daily., Disp: 180 tablet, Rfl: 1 .  atorvastatin (LIPITOR) 40 MG tablet, TAKE 1 TABLET(40 MG) BY MOUTH DAILY, Disp: 90 tablet, Rfl: 1 .  glucose blood (ONE TOUCH ULTRA TEST) test strip, Use as instructed, Disp: 100 each, Rfl: 12 .  levothyroxine (SYNTHROID) 100 MCG tablet, TAKE 1 TABLET(100 MCG) BY MOUTH DAILY, Disp: 90 tablet, Rfl: 1 .  metFORMIN (GLUCOPHAGE) 1000 MG tablet, TAKE 1 TABLET(1000 MG) BY MOUTH TWICE DAILY WITH A MEAL. FOLLOW-UP APPOINTMENT DUE IN JULY, Disp: 180 tablet, Rfl: 1 .  metoprolol succinate (TOPROL-XL) 50 MG 24 hr tablet, Take 50 mg by mouth daily. , Disp: , Rfl:  .  PARoxetine (PAXIL CR) 25 MG 24 hr tablet, Take 1 tablet (25 mg total) by mouth daily., Disp: 30 tablet, Rfl: 9 .  spironolactone (ALDACTONE) 25 MG tablet, Take 1 tablet (25 mg total) by mouth daily., Disp: 90 tablet, Rfl: 0 .  zolpidem (AMBIEN) 5 MG tablet, Take 1 tablet (5 mg total) by mouth at bedtime as needed for sleep., Disp: 90 tablet, Rfl: 1   Allergies:  Factive [gemifloxacin mesylate], Crestor [rosuvastatin], Sulfonamide derivatives, Gemifloxacin, and Penicillins  Review of Systems:  Constitutional: Feels well. Cardiovascular: Denies chest pain, exertional chest pain.  Pulmonary: Denies hemoptysis, pleuritic chest pain.   The remainder of systems were reviewed and were found to be negative other than what is documented in the HPI.    Physical Examination:   VS: BP 128/76 (BP Location: Left Arm, Cuff Size: Normal)   Pulse 69   Temp 97.8 F (36.6 C) (Temporal)   Ht 5' 7"  (1.702 m)   Wt 213 lb 9.6 oz (96.9 kg)   SpO2 96%   BMI 33.45 kg/m   General Appearance: No distress   Neuro:without focal findings, mental status, speech normal, alert and oriented HEENT: PERRLA, EOM intact Pulmonary: No wheezing, No rales  CardiovascularNormal S1,S2.  No m/r/g.  Abdomen: Benign, Soft, non-tender, No masses Renal:  No costovertebral tenderness  GU:  No performed at this time. Endoc: No evident thyromegaly, no signs of acromegaly or Cushing features Skin:   warm, no rashes, no ecchymosis  Extremities: normal, no cyanosis, clubbing.      LABORATORY PANEL:   CBC No results for input(s): WBC, HGB, HCT, PLT in the last 168 hours. ------------------------------------------------------------------------------------------------------------------  Chemistries  No results for input(s): NA, K, CL, CO2, GLUCOSE, BUN, CREATININE, CALCIUM, MG, AST, ALT, ALKPHOS, BILITOT in the last 168 hours.  Invalid input(s): GFRCGP ------------------------------------------------------------------------------------------------------------------  Cardiac Enzymes No results for input(s): TROPONINI in the last 168 hours. ------------------------------------------------------------  RADIOLOGY:   No results found for this or any previous visit. Results for orders placed during the hospital encounter of 03/29/16  DG Chest 2 View   Narrative CLINICAL  DATA:  Dysnea  EXAM: CHEST  2 VIEW  COMPARISON:  01/02/2016, 10/07/2015  FINDINGS: The heart size and mediastinal contours stable in appearance. No aortic aneurysm. Borderline cardiomegaly. Trace edema along the right major fissure. Subsegmental atelectasis at the left lung base. Tiny 2 mm nodular density overlying the right posterior fifth rib is new since prior exam and may reflect a pulmonary vessel on end or potentially tiny nodule. No overt pulmonary edema. Eventration of the right hemidiaphragm is stable. The visualized skeletal structures are unremarkable.  IMPRESSION: No active cardiopulmonary disease. Platelike atelectasis  at the left lung base. Tiny nodular density overlying the right upper lobe may reflect pulmonary vessel on end pulmonary nodule or summation shadow. It is unchanged in appearance when compared with recent comparison.   Electronically Signed   By: Ashley Royalty M.D.   On: 03/29/2016 17:49    ------------------------------------------------------------------------------------------------------------------  Thank  you for allowing [facility] to assist in the care of your patient. Our recommendations are noted above.  Please contact us if we can be of further service.   Marda Stalker, M.D., F.C.C.P.  Board Certified in Internal Medicine, Pulmonary Medicine, [place], and Sleep Medicine.  [facility] Office Number: [phone]  10/30/2018

## 2018-11-03 ENCOUNTER — Encounter: Payer: Self-pay | Admitting: Internal Medicine

## 2018-11-03 ENCOUNTER — Other Ambulatory Visit (INDEPENDENT_AMBULATORY_CARE_PROVIDER_SITE_OTHER): Payer: PPO

## 2018-11-03 ENCOUNTER — Other Ambulatory Visit: Payer: Self-pay

## 2018-11-03 ENCOUNTER — Ambulatory Visit (INDEPENDENT_AMBULATORY_CARE_PROVIDER_SITE_OTHER): Payer: PPO | Admitting: Internal Medicine

## 2018-11-03 VITALS — BP 130/84 | HR 66 | Temp 98.5°F | Resp 16 | Ht 67.0 in | Wt 216.0 lb

## 2018-11-03 DIAGNOSIS — Z23 Encounter for immunization: Secondary | ICD-10-CM

## 2018-11-03 DIAGNOSIS — G4700 Insomnia, unspecified: Secondary | ICD-10-CM | POA: Diagnosis not present

## 2018-11-03 DIAGNOSIS — E876 Hypokalemia: Secondary | ICD-10-CM | POA: Diagnosis not present

## 2018-11-03 DIAGNOSIS — G4730 Sleep apnea, unspecified: Secondary | ICD-10-CM

## 2018-11-03 DIAGNOSIS — F5105 Insomnia due to other mental disorder: Secondary | ICD-10-CM | POA: Diagnosis not present

## 2018-11-03 DIAGNOSIS — T502X5A Adverse effect of carbonic-anhydrase inhibitors, benzothiadiazides and other diuretics, initial encounter: Secondary | ICD-10-CM

## 2018-11-03 DIAGNOSIS — E538 Deficiency of other specified B group vitamins: Secondary | ICD-10-CM

## 2018-11-03 DIAGNOSIS — I10 Essential (primary) hypertension: Secondary | ICD-10-CM

## 2018-11-03 DIAGNOSIS — F409 Phobic anxiety disorder, unspecified: Secondary | ICD-10-CM

## 2018-11-03 LAB — BASIC METABOLIC PANEL
BUN: 13 mg/dL (ref 6–23)
CO2: 34 mEq/L — ABNORMAL HIGH (ref 19–32)
Calcium: 10.3 mg/dL (ref 8.4–10.5)
Chloride: 99 mEq/L (ref 96–112)
Creatinine, Ser: 0.71 mg/dL (ref 0.40–1.20)
GFR: 83.62 mL/min (ref >60.00)
Glucose, Bld: 147 mg/dL — ABNORMAL HIGH (ref 70–99)
Potassium: 3.7 mEq/L (ref 3.5–5.1)
Sodium: 141 mEq/L (ref 135–145)

## 2018-11-03 MED ORDER — CYANOCOBALAMIN 1000 MCG/ML IJ SOLN
1000.0000 ug | Freq: Once | INTRAMUSCULAR | Status: AC
  Administered 2018-11-03: 1000 ug via INTRAMUSCULAR

## 2018-11-03 MED ORDER — DAYVIGO 5 MG PO TABS: 1.0000 | ORAL_TABLET | Freq: Every evening | ORAL | 0 refills | Status: DC | PRN

## 2018-11-03 NOTE — Progress Notes (Signed)
Subjective:  Patient ID: Kirsten [name], female    DOB: Oct 17, 1955  Age: 63 y.o. MRN: [id number]  CC: Hypertension   HPI Kirsten [name] presents for f/up - She complains of insomnia with difficulty falling asleep and frequent awakenings.  When she has trouble sleeping she feels fatigued and irritable the next day and has to take naps to catch up.  She tells me that 5 mg of Ambien at bedtime is not helping.  Outpatient Medications Prior to Visit  Medication Sig Dispense Refill   amiodarone (PACERONE) 200 MG tablet Take 200 mg daily by mouth.     apixaban (ELIQUIS) 5 MG TABS tablet Take 1 tablet (5 mg total) by mouth 2 (two) times daily. 180 tablet 1   atorvastatin (LIPITOR) 40 MG tablet TAKE 1 TABLET(40 MG) BY MOUTH DAILY 90 tablet 1   glucose blood (ONE TOUCH ULTRA TEST) test strip Use as instructed 100 each 12   levothyroxine (SYNTHROID) 100 MCG tablet TAKE 1 TABLET(100 MCG) BY MOUTH DAILY 90 tablet 1   metFORMIN (GLUCOPHAGE) 1000 MG tablet TAKE 1 TABLET(1000 MG) BY MOUTH TWICE DAILY WITH A MEAL. FOLLOW-UP APPOINTMENT DUE IN JULY 180 tablet 1   metoprolol succinate (TOPROL-XL) 50 MG 24 hr tablet Take 50 mg by mouth daily.      PARoxetine (PAXIL CR) 25 MG 24 hr tablet Take 1 tablet (25 mg total) by mouth daily. 30 tablet 9   spironolactone (ALDACTONE) 25 MG tablet Take 1 tablet (25 mg total) by mouth daily. 90 tablet 0   zolpidem (AMBIEN) 5 MG tablet Take 1 tablet (5 mg total) by mouth at bedtime as needed for sleep. 90 tablet 1   No facility-administered medications prior to visit.     ROS Review of Systems  Constitutional: Negative for diaphoresis, fatigue and unexpected weight change.  HENT: Negative.   Eyes: Negative for visual disturbance.  Respiratory: Negative for cough, shortness of breath and wheezing.   Cardiovascular: Negative for chest pain, palpitations and leg swelling.  Gastrointestinal: Negative for abdominal pain, diarrhea, nausea and vomiting.    Endocrine: Negative.  Negative for polydipsia, polyphagia and polyuria.  Genitourinary: Negative.   Musculoskeletal: Negative for arthralgias and myalgias.  Skin: Negative.  Negative for color change and pallor.  Neurological: Negative.  Negative for dizziness, weakness, light-headedness and headaches.  Hematological: Negative for adenopathy. Does not bruise/bleed easily.  Psychiatric/Behavioral: Positive for sleep disturbance. Negative for behavioral problems, decreased concentration, dysphoric mood and suicidal ideas. The patient is not nervous/anxious.     Objective:  BP 130/84 (BP Location: Left Arm, Patient Position: Sitting, Cuff Size: Large)    Pulse 66    Temp 98.5 F (36.9 C) (Oral)    Resp 16    Ht 5' 7"  (1.702 m)    Wt 216 lb (98 kg)    SpO2 97%    BMI 33.83 kg/m   BP Readings from Last 3 Encounters:  11/03/18 130/84  10/30/18 128/76  09/18/18 120/82    Wt Readings from Last 3 Encounters:  11/03/18 216 lb (98 kg)  10/30/18 213 lb 9.6 oz (96.9 kg)  09/18/18 217 lb (98.4 kg)    Physical Exam Vitals signs reviewed.  Constitutional:      Appearance: She is obese. She is not ill-appearing or diaphoretic.  HENT:     Nose: Nose normal.     Mouth/Throat:     Pharynx: Oropharynx is clear.  Eyes:     General: No scleral icterus.  Conjunctiva/sclera: Conjunctivae normal.  Neck:     Musculoskeletal: Normal range of motion and neck supple.  Cardiovascular:     Rate and Rhythm: Normal rate and regular rhythm.     Heart sounds: No murmur.  Pulmonary:     Effort: Pulmonary effort is normal.     Breath sounds: No stridor. No wheezing, rhonchi or rales.  Abdominal:     General: Abdomen is protuberant. Bowel sounds are normal. There is no distension.     Palpations: There is no hepatomegaly or splenomegaly.  Musculoskeletal: Normal range of motion.     Right lower leg: No edema.     Left lower leg: No edema.  Lymphadenopathy:     Cervical: No cervical adenopathy.   Skin:    General: Skin is warm and dry.  Neurological:     General: No focal deficit present.     Mental Status: She is alert.  Psychiatric:        Mood and Affect: Mood normal.        Behavior: Behavior normal.        Thought Content: Thought content normal.        Judgment: Judgment normal.     Lab Results  Component Value Date   WBC 10.1 03/20/2018   HGB 13.7 03/20/2018   HCT 41.1 03/20/2018   PLT 314.0 03/20/2018   GLUCOSE 152 (H) 09/18/2018   CHOL 121 03/20/2018   TRIG 153.0 (H) 09/18/2018   HDL 40.70 03/20/2018   LDLDIRECT 56.0 03/20/2018   LDLCALC SEE COMMENT 11/26/2013   ALT 28 03/20/2018   AST 20 03/20/2018   NA 141 09/18/2018   K 3.2 (L) 09/18/2018   CL 99 09/18/2018   CREATININE 0.76 09/18/2018   BUN 20 09/18/2018   CO2 32 09/18/2018   TSH 2.66 09/18/2018   INR 1.1 11/27/2013   HGBA1C 7.0 (H) 09/18/2018   MICROALBUR 1.2 09/18/2018    Dg Bone Density  Result Date: 04/08/2018 EXAM: DUAL X-RAY ABSORPTIOMETRY (DXA) FOR BONE MINERAL DENSITY IMPRESSION: Dear Dr Ronnald Ramp, [name] completed a FRAX assessment on 04/08/2018 using the [place] (analysis version: 14.10) manufactured by [employer]. The following summarizes the results of our evaluation. PATIENT BIOGRAPHICAL: [name], Kirsten [name] Patient ID: [id number] Birth Date: 1955-08-12 Height:    67.0 in. Gender:     Female    Age:        62.6       Weight:    216.4 lbs. Ethnicity:  White                            Exam Date: 04/08/2018 FRAX* RESULTS:  (version: 3.5) 10-year Probability of Fracture1 Major Osteoporotic Fracture2 Hip Fracture 14.5% 1.6% Population: [place] (Caucasian) Risk Factors: History of Fracture (Adult) Based on Femur (Left) Neck BMD 1 -The 10-year probability of fracture may be lower than reported if the patient has received treatment. 2 -Major Osteoporotic Fracture: Clinical Spine, Forearm, Hip or Shoulder *FRAX is a Materials engineer of the [employer] for Metabolic Bone Disease, a [place] (WHO) [facility]. ASSESSMENT: The probability of a major osteoporotic fracture is 14.5% within the next ten years. The probability of a hip fracture is 1.6% within the next ten years. . Technologist: [employer] PATIENT BIOGRAPHICAL: [name], Kirsten [name] Patient ID: [id number] Birth Date: [birth year] Height: 67.0 in. Gender: Female Exam Date: 04/08/2018 Weight: 216.4  lbs. Indications: Asthma, Caucasian, COPD, Diabetic, History of Fracture (Adult), Hypothyroid, Osteopenia, Postmenopausal Fractures: coccyx, Right elbow, Right forearm, Right humerus Treatments: Calcium, Levothyroxine, Metformin, Multi-Vitamin ASSESSMENT: The BMD measured at Femur Neck Left is 0.789 g/cm2 with a T-score of -1.8. This patient is considered osteopenic according to [facility]) criteria. The quality of the scan is good. L1 was excluded due to degenerative changes. Site Region Measured Measured WHO Young Adult BMD Date       Age      Classification T-score AP Spine L2-L4 04/08/2018 60.6 Osteopenia -1.4 1.042 g/cm2 DualFemur Neck Left 04/08/2018 60.6 Osteopenia -1.8 0.789 g/cm2 World Health Organization [facility]) criteria for post-menopausal, Caucasian Women: Normal:       T-score at or above -1 SD Osteopenia:   T-score between -1 and -2.5 SD Osteoporosis: T-score at or below -2.5 SD RECOMMENDATIONS: 1. All patients should optimize calcium and vitamin D intake. 2. Consider FDA-approved medical therapies in postmenopausal women and men aged 21 years and older, based on the following: a. A hip or vertebral(clinical or morphometric) fracture b. T-score < -2.5 at the femoral neck or spine after appropriate evaluation to exclude secondary causes c. Low bone mass (T-score between -1.0 and -2.5 at the femoral neck or spine) and a 10-year probability of a hip fracture > 3% or a 10-year probability of a major osteoporosis-related fracture > 20% based on the  [place]-adapted WHO algorithm d. Clinician judgment and/or patient preferences may indicate treatment for people with 10-year fracture probabilities above or below these levels FOLLOW-UP: People with diagnosed cases of osteoporosis or at high risk for fracture should have regular bone mineral density tests. For patients eligible for Medicare, routine testing is allowed once every 2 years. The testing frequency can be increased to one year for patients who have rapidly progressing disease, those who are receiving or discontinuing medical therapy to restore bone mass, or have additional risk factors. I have reviewed this report, and agree with the above findings. [facility] Electronically Signed   By: Lowella Grip III M.D.   On: 04/08/2018 14:37   Mm 3d Screen Breast Bilateral  Result Date: 04/08/2018 CLINICAL DATA:  Screening. EXAM: DIGITAL SCREENING BILATERAL MAMMOGRAM WITH TOMO AND CAD COMPARISON:  Previous exam(s). ACR Breast Density Category b: There are scattered areas of fibroglandular density. FINDINGS: There are no findings suspicious for malignancy. Images were processed with CAD. IMPRESSION: No mammographic evidence of malignancy. A result letter of this screening mammogram will be mailed directly to the patient. RECOMMENDATION: Screening mammogram in one year. (Code:SM-B-01Y) BI-RADS CATEGORY  1: Negative. Electronically Signed   By: Abelardo Diesel M.D.   On: 04/08/2018 14:22    Assessment & Plan:   Anushri was seen today for hypertension.  Diagnoses and all orders for this visit:  Need for influenza vaccination -     Flu Vaccine QUAD 36+ mos IM  Vitamin B 12 deficiency -     cyanocobalamin ((VITAMIN B-12)) injection 1,000 mcg  Essential hypertension, benign- Her blood pressure is adequately well controlled.  Electrolytes and renal function are normal. -     Basic metabolic panel; Future  Diuretic-induced hypokalemia- Her potassium level is normal now.  I have asked her to  stay on the current dose of spironolactone. -     Basic metabolic panel; Future  Insomnia due to anxiety and fear- I recommended that she try [place].  She will start with the 5 mg tablets and then if needed we can increase  the dose to 10 mg at bedtime. -     Lemborexant (DAYVIGO) 5 MG TABS; Take 1 tablet by mouth at bedtime as needed.  Insomnia w/ sleep apnea -     Lemborexant (DAYVIGO) 5 MG TABS; Take 1 tablet by mouth at bedtime as needed.   I have discontinued Nuria A. See's zolpidem. I am also having her start on DayVigo. Additionally, I am having her maintain her glucose blood, metoprolol succinate, amiodarone, levothyroxine, metFORMIN, atorvastatin, apixaban, spironolactone, and PARoxetine. We will continue to administer cyanocobalamin.  Meds ordered this encounter  Medications   cyanocobalamin ((VITAMIN B-12)) injection 1,000 mcg   Lemborexant (DAYVIGO) 5 MG TABS    Sig: Take 1 tablet by mouth at bedtime as needed.    Dispense:  10 tablet    Refill:  0     Follow-up: Return in about 4 months (around 03/05/2019).  [name], MD

## 2018-11-03 NOTE — Patient Instructions (Signed)

## 2018-11-24 ENCOUNTER — Encounter: Payer: Self-pay | Admitting: Internal Medicine

## 2018-11-25 ENCOUNTER — Other Ambulatory Visit: Payer: Self-pay | Admitting: Internal Medicine

## 2018-11-27 ENCOUNTER — Encounter: Payer: Self-pay | Admitting: Internal Medicine

## 2018-11-28 ENCOUNTER — Other Ambulatory Visit: Payer: Self-pay | Admitting: *Deleted

## 2018-11-28 DIAGNOSIS — E785 Hyperlipidemia, unspecified: Secondary | ICD-10-CM

## 2018-11-28 MED ORDER — ATORVASTATIN CALCIUM 40 MG PO TABS: ORAL_TABLET | ORAL | 1 refills | Status: DC

## 2018-12-11 DIAGNOSIS — I480 Paroxysmal atrial fibrillation: Secondary | ICD-10-CM | POA: Diagnosis not present

## 2018-12-11 DIAGNOSIS — R0602 Shortness of breath: Secondary | ICD-10-CM | POA: Diagnosis not present

## 2018-12-11 DIAGNOSIS — R55 Syncope and collapse: Secondary | ICD-10-CM | POA: Diagnosis not present

## 2018-12-11 DIAGNOSIS — G4733 Obstructive sleep apnea (adult) (pediatric): Secondary | ICD-10-CM | POA: Diagnosis not present

## 2018-12-11 DIAGNOSIS — I2510 Atherosclerotic heart disease of native coronary artery without angina pectoris: Secondary | ICD-10-CM | POA: Diagnosis not present

## 2018-12-11 DIAGNOSIS — I4891 Unspecified atrial fibrillation: Secondary | ICD-10-CM | POA: Diagnosis not present

## 2018-12-11 DIAGNOSIS — I10 Essential (primary) hypertension: Secondary | ICD-10-CM | POA: Diagnosis not present

## 2018-12-24 DIAGNOSIS — G4733 Obstructive sleep apnea (adult) (pediatric): Secondary | ICD-10-CM | POA: Diagnosis not present

## 2018-12-29 ENCOUNTER — Other Ambulatory Visit: Payer: Self-pay | Admitting: Internal Medicine

## 2018-12-29 ENCOUNTER — Encounter: Payer: Self-pay | Admitting: Internal Medicine

## 2018-12-29 DIAGNOSIS — E785 Hyperlipidemia, unspecified: Secondary | ICD-10-CM

## 2018-12-29 MED ORDER — ATORVASTATIN CALCIUM 40 MG PO TABS: ORAL_TABLET | ORAL | 1 refills | Status: DC

## 2018-12-31 ENCOUNTER — Telehealth: Payer: Self-pay | Admitting: Certified Nurse Midwife

## 2018-12-31 ENCOUNTER — Encounter: Payer: Self-pay | Admitting: Internal Medicine

## 2018-12-31 NOTE — Telephone Encounter (Signed)
CM,   How would you like to schedule/address the patients need?  Thanks  -TH

## 2018-12-31 NOTE — Telephone Encounter (Signed)
The patient called and stated that she has a UTI and needs to come in to the office to do a urine drop off. Pt requesting approval to do so. Please advise.

## 2018-12-31 NOTE — Telephone Encounter (Signed)
The pt may drop off a urine if its approved by the provider.   If provider approves it. The pt will be placed on the providers schedule and check in as normal. The nurse will do a u/a, notify provider of results and send for culture or give ATB per the provider.   Thanks.

## 2019-01-01 ENCOUNTER — Other Ambulatory Visit: Payer: Self-pay

## 2019-01-01 DIAGNOSIS — N390 Urinary tract infection, site not specified: Secondary | ICD-10-CM | POA: Diagnosis not present

## 2019-01-01 MED ORDER — NITROFURANTOIN MONOHYD MACRO 100 MG PO CAPS: 100.0000 mg | ORAL_CAPSULE | Freq: Two times a day (BID) | ORAL | 0 refills | Status: DC

## 2019-01-01 NOTE — Telephone Encounter (Signed)
Called and spoke with patient to make her aware Macrobid has been sent to pharmacy on file and urine sent for culture.  Patient verbalized understanding.

## 2019-01-01 NOTE — Telephone Encounter (Signed)
Since it's a urine drop-off, patient can come in this morning.  Once we send off urine I can send her prescription in.  Thanks.

## 2019-01-04 ENCOUNTER — Other Ambulatory Visit: Payer: Self-pay | Admitting: Certified Nurse Midwife

## 2019-01-04 LAB — URINE CULTURE

## 2019-01-15 ENCOUNTER — Ambulatory Visit (INDEPENDENT_AMBULATORY_CARE_PROVIDER_SITE_OTHER): Payer: PPO | Admitting: Internal Medicine

## 2019-01-15 ENCOUNTER — Other Ambulatory Visit (INDEPENDENT_AMBULATORY_CARE_PROVIDER_SITE_OTHER): Payer: PPO

## 2019-01-15 ENCOUNTER — Other Ambulatory Visit: Payer: Self-pay

## 2019-01-15 ENCOUNTER — Encounter: Payer: Self-pay | Admitting: Internal Medicine

## 2019-01-15 VITALS — BP 120/70 | HR 63 | Temp 97.9°F | Ht 67.0 in | Wt 212.0 lb

## 2019-01-15 DIAGNOSIS — B9620 Unspecified Escherichia coli [E. coli] as the cause of diseases classified elsewhere: Secondary | ICD-10-CM

## 2019-01-15 DIAGNOSIS — E032 Hypothyroidism due to medicaments and other exogenous substances: Secondary | ICD-10-CM | POA: Diagnosis not present

## 2019-01-15 DIAGNOSIS — E118 Type 2 diabetes mellitus with unspecified complications: Secondary | ICD-10-CM

## 2019-01-15 DIAGNOSIS — E538 Deficiency of other specified B group vitamins: Secondary | ICD-10-CM | POA: Diagnosis not present

## 2019-01-15 DIAGNOSIS — N390 Urinary tract infection, site not specified: Secondary | ICD-10-CM | POA: Insufficient documentation

## 2019-01-15 DIAGNOSIS — I10 Essential (primary) hypertension: Secondary | ICD-10-CM

## 2019-01-15 DIAGNOSIS — E038 Other specified hypothyroidism: Secondary | ICD-10-CM | POA: Diagnosis not present

## 2019-01-15 DIAGNOSIS — E781 Pure hyperglyceridemia: Secondary | ICD-10-CM

## 2019-01-15 DIAGNOSIS — E785 Hyperlipidemia, unspecified: Secondary | ICD-10-CM

## 2019-01-15 DIAGNOSIS — I480 Paroxysmal atrial fibrillation: Secondary | ICD-10-CM

## 2019-01-15 LAB — LIPID PANEL
Cholesterol: 128 mg/dL (ref 0–200)
HDL: 41.8 mg/dL (ref >39.00)
NonHDL: 86.61
Total CHOL/HDL Ratio: 3
Triglycerides: 230 mg/dL — ABNORMAL HIGH (ref 0.0–149.0)
VLDL: 46 mg/dL — ABNORMAL HIGH (ref 0.0–40.0)

## 2019-01-15 LAB — BASIC METABOLIC PANEL
BUN: 16 mg/dL (ref 6–23)
CO2: 30 mEq/L (ref 19–32)
Calcium: 10.3 mg/dL (ref 8.4–10.5)
Chloride: 101 mEq/L (ref 96–112)
Creatinine, Ser: 0.83 mg/dL (ref 0.40–1.20)
GFR: 69.78 mL/min (ref >60.00)
Glucose, Bld: 167 mg/dL — ABNORMAL HIGH (ref 70–99)
Potassium: 4.5 mEq/L (ref 3.5–5.1)
Sodium: 141 mEq/L (ref 135–145)

## 2019-01-15 LAB — HEMOGLOBIN A1C: Hgb A1c MFr Bld: 7.1 % — ABNORMAL HIGH (ref 4.6–6.5)

## 2019-01-15 LAB — TSH: TSH: 4.75 u[IU]/mL — ABNORMAL HIGH (ref 0.35–4.50)

## 2019-01-15 LAB — LDL CHOLESTEROL, DIRECT: Direct LDL: 63 mg/dL

## 2019-01-15 MED ORDER — CYANOCOBALAMIN 1000 MCG/ML IJ SOLN
1000.0000 ug | Freq: Once | INTRAMUSCULAR | Status: AC
  Administered 2019-01-15: 1000 ug via INTRAMUSCULAR

## 2019-01-15 MED ORDER — METFORMIN HCL 1000 MG PO TABS: ORAL_TABLET | ORAL | 1 refills | Status: DC

## 2019-01-15 MED ORDER — LEVOTHYROXINE SODIUM 100 MCG PO TABS: ORAL_TABLET | ORAL | 1 refills | Status: DC

## 2019-01-15 MED ORDER — APIXABAN 5 MG PO TABS: 5.0000 mg | ORAL_TABLET | Freq: Two times a day (BID) | ORAL | 1 refills | Status: DC

## 2019-01-15 NOTE — Patient Instructions (Signed)
Type 2 Diabetes Mellitus, Diagnosis, Adult Type 2 diabetes (type 2 diabetes mellitus) is a long-term (chronic) disease. In type 2 diabetes, one or both of these problems may be present:  The pancreas does not make enough of a hormone called insulin.  Cells in the body do not respond properly to insulin that the body makes (insulin resistance). Normally, insulin allows blood sugar (glucose) to enter cells in the body. The cells use glucose for energy. Insulin resistance or lack of insulin causes excess glucose to build up in the blood instead of going into cells. As a result, high blood glucose (hyperglycemia) develops. What increases the risk? The following factors may make you more likely to develop type 2 diabetes:  Having a family member with type 2 diabetes.  Being overweight or obese.  Having an inactive (sedentary) lifestyle.  Having been diagnosed with insulin resistance.  Having a history of prediabetes, gestational diabetes, or polycystic ovary syndrome (PCOS).  Being of American-Indian, African-American, Hispanic/Latino, or Asian/Pacific Islander descent. What are the signs or symptoms? In the early stage of this condition, you may not have symptoms. Symptoms develop slowly and may include:  Increased thirst (polydipsia).  Increased hunger(polyphagia).  Increased urination (polyuria).  Increased urination during the night (nocturia).  Unexplained weight loss.  Frequent infections that keep coming back (recurring).  Fatigue.  Weakness.  Vision changes, such as blurry vision.  Cuts or bruises that are slow to heal.  Tingling or numbness in the hands or feet.  Dark patches on the skin (acanthosis nigricans). How is this diagnosed? This condition is diagnosed based on your symptoms, your medical history, a physical exam, and your blood glucose level. Your blood glucose may be checked with one or more of the following blood tests:  A fasting blood glucose (FBG)  test. You will not be allowed to eat (you will fast) for 8 hours or longer before a blood sample is taken.  A random blood glucose test. This test checks blood glucose at any time of day regardless of when you ate.  An A1c (hemoglobin A1c) blood test. This test provides information about blood glucose control over the previous 2-3 months.  An oral glucose tolerance test (OGTT). This test measures your blood glucose at two times: ? After fasting. This is your baseline blood glucose level. ? Two hours after drinking a beverage that contains glucose. You may be diagnosed with type 2 diabetes if:  Your FBG level is 126 mg/dL (7.0 mmol/L) or higher.  Your random blood glucose level is 200 mg/dL (11.1 mmol/L) or higher.  Your A1c level is 6.5% or higher.  Your OGTT result is higher than 200 mg/dL (11.1 mmol/L). These blood tests may be repeated to confirm your diagnosis. How is this treated? Your treatment may be managed by a specialist called an endocrinologist. Type 2 diabetes may be treated by following instructions from your health care provider about:  Making diet and lifestyle changes. This may include: ? Following an individualized nutrition plan that is developed by a diet and nutrition specialist (registered dietitian). ? Exercising regularly. ? Finding ways to manage stress.  Checking your blood glucose level as often as told.  Taking diabetes medicines or insulin daily. This helps to keep your blood glucose levels in the healthy range. ? If you use insulin, you may need to adjust the dosage depending on how physically active you are and what foods you eat. Your health care provider will tell you how to adjust your dosage.    Taking medicines to help prevent complications from diabetes, such as: ? Aspirin. ? Medicine to lower cholesterol. ? Medicine to control blood pressure. Your health care provider will set individualized treatment goals for you. Your goals will be based on  your age, other medical conditions you have, and how you respond to diabetes treatment. Generally, the goal of treatment is to maintain the following blood glucose levels:  Before meals (preprandial): 80-130 mg/dL (4.4-7.2 mmol/L).  After meals (postprandial): below 180 mg/dL (10 mmol/L).  A1c level: less than 7%. Follow these instructions at home: Questions to ask your health care provider  Consider asking the following questions: ? Do I need to meet with a diabetes educator? ? Where can I find a support group for people with diabetes? ? What equipment will I need to manage my diabetes at home? ? What diabetes medicines do I need, and when should I take them? ? How often do I need to check my blood glucose? ? What number can I call if I have questions? ? When is my next appointment? General instructions  Take over-the-counter and prescription medicines only as told by your health care provider.  Keep all follow-up visits as told by your health care provider. This is important.  For more information about diabetes, visit: ? American Diabetes Association (ADA): www.diabetes.org ? American Association of Diabetes Educators (AADE): www.diabeteseducator.org Contact a health care provider if:  Your blood glucose is at or above 240 mg/dL (13.3 mmol/L) for 2 days in a row.  You have been sick or have had a fever for 2 days or longer, and you are not getting better.  You have any of the following problems for more than 6 hours: ? You cannot eat or drink. ? You have nausea and vomiting. ? You have diarrhea. Get help right away if:  Your blood glucose is lower than 54 mg/dL (3.0 mmol/L).  You become confused or you have trouble thinking clearly.  You have difficulty breathing.  You have moderate or large ketone levels in your urine. Summary  Type 2 diabetes (type 2 diabetes mellitus) is a long-term (chronic) disease. In type 2 diabetes, the pancreas does not make enough of a  hormone called insulin, or cells in the body do not respond properly to insulin that the body makes (insulin resistance).  This condition is treated by making diet and lifestyle changes and taking diabetes medicines or insulin.  Your health care provider will set individualized treatment goals for you. Your goals will be based on your age, other medical conditions you have, and how you respond to diabetes treatment.  Keep all follow-up visits as told by your health care provider. This is important. This information is not intended to replace advice given to you by your health care provider. Make sure you discuss any questions you have with your health care provider. Document Released: 01/29/2005 Document Revised: 03/29/2017 Document Reviewed: 03/04/2015 Elsevier Patient Education  2020 [employer]  

## 2019-01-15 NOTE — Progress Notes (Signed)
Subjective:  Patient ID: Kirsten [name], female    DOB: 08-Jan-1956  Age: 63 y.o. MRN: [id number]  CC: Hypertension, Hypothyroidism, Diabetes, and Hyperlipidemia  This visit occurred during the SARS-CoV-2 public health emergency.  Safety protocols were in place, including screening questions prior to the visit, additional usage of staff PPE, and extensive cleaning of exam room while observing appropriate contact time as indicated for disinfecting solutions.   HPI Kirsten [name] presents for f/up - She tells me that she saw her gynecologist 2 weeks ago and took a course of antibiotics for an E. coli UTI.  She tells me that all of her urinary symptoms have resolved.  Outpatient Medications Prior to Visit  Medication Sig Dispense Refill  . amiodarone (PACERONE) 200 MG tablet Take 200 mg daily by mouth.    Marland Kitchen atorvastatin (LIPITOR) 40 MG tablet TAKE 1 TABLET(40 MG) BY MOUTH DAILY 90 tablet 1  . glucose blood (ONE TOUCH ULTRA TEST) test strip Use as instructed 100 each 12  . metoprolol succinate (TOPROL-XL) 50 MG 24 hr tablet Take 50 mg by mouth daily.     Marland Kitchen PARoxetine (PAXIL CR) 25 MG 24 hr tablet Take 1 tablet (25 mg total) by mouth daily. 30 tablet 9  . apixaban (ELIQUIS) 5 MG TABS tablet Take 1 tablet (5 mg total) by mouth 2 (two) times daily. 180 tablet 1  . levothyroxine (SYNTHROID) 100 MCG tablet TAKE 1 TABLET(100 MCG) BY MOUTH DAILY 90 tablet 1  . metFORMIN (GLUCOPHAGE) 1000 MG tablet TAKE 1 TABLET(1000 MG) BY MOUTH TWICE DAILY WITH A MEAL. FOLLOW-UP APPOINTMENT DUE IN JULY 180 tablet 1  . nitrofurantoin, macrocrystal-monohydrate, (MACROBID) 100 MG capsule Take 1 capsule (100 mg total) by mouth 2 (two) times daily. 14 capsule 0  . spironolactone (ALDACTONE) 25 MG tablet Take 1 tablet (25 mg total) by mouth daily. 90 tablet 0   No facility-administered medications prior to visit.     ROS Review of Systems  Constitutional: Negative.  Negative for chills, diaphoresis, fatigue and  fever.  HENT: Negative.   Eyes: Negative.   Respiratory: Negative for cough, chest tightness and shortness of breath.   Cardiovascular: Negative.  Negative for chest pain and leg swelling.  Gastrointestinal: Negative for abdominal pain, diarrhea and nausea.  Endocrine: Negative.  Negative for cold intolerance and heat intolerance.  Genitourinary: Negative.  Negative for decreased urine volume, difficulty urinating, dysuria, hematuria and urgency.  Musculoskeletal: Negative.  Negative for arthralgias and myalgias.  Skin: Negative.   Neurological: Negative.  Negative for dizziness, weakness and light-headedness.  Hematological: Negative for adenopathy. Does not bruise/bleed easily.  Psychiatric/Behavioral: Negative.     Objective:  BP 120/70 (BP Location: Left Arm, Patient Position: Sitting, Cuff Size: Large)   Pulse 63   Temp 97.9 F (36.6 C) (Oral)   Ht 5' 7"  (1.702 m)   Wt 212 lb (96.2 kg)   SpO2 98%   BMI 33.20 kg/m   BP Readings from Last 3 Encounters:  01/15/19 120/70  11/03/18 130/84  10/30/18 128/76    Wt Readings from Last 3 Encounters:  01/15/19 212 lb (96.2 kg)  11/03/18 216 lb (98 kg)  10/30/18 213 lb 9.6 oz (96.9 kg)    Physical Exam Constitutional:      Appearance: Normal appearance.  HENT:     Nose: Nose normal.     Mouth/Throat:     Mouth: Mucous membranes are moist.  Eyes:     General: No scleral icterus.  Conjunctiva/sclera: Conjunctivae normal.  Neck:     Musculoskeletal: Neck supple.  Cardiovascular:     Rate and Rhythm: Normal rate and regular rhythm.     Heart sounds: No murmur.  Pulmonary:     Effort: Pulmonary effort is normal.     Breath sounds: No wheezing, rhonchi or rales.  Abdominal:     General: Abdomen is protuberant. Bowel sounds are normal. There is no distension.     Palpations: Abdomen is soft. There is no hepatomegaly or splenomegaly.     Tenderness: There is no abdominal tenderness.  Musculoskeletal: Normal range of  motion.     Right lower leg: No edema.     Left lower leg: No edema.  Lymphadenopathy:     Cervical: No cervical adenopathy.  Skin:    General: Skin is warm and dry.  Neurological:     General: No focal deficit present.     Mental Status: She is alert.  Psychiatric:        Mood and Affect: Mood normal.        Behavior: Behavior normal.     Lab Results  Component Value Date   WBC 10.1 03/20/2018   HGB 13.7 03/20/2018   HCT 41.1 03/20/2018   PLT 314.0 03/20/2018   GLUCOSE 167 (H) 01/15/2019   CHOL 128 01/15/2019   TRIG 230.0 (H) 01/15/2019   HDL 41.80 01/15/2019   LDLDIRECT 63.0 01/15/2019   LDLCALC SEE COMMENT 11/26/2013   ALT 28 03/20/2018   AST 20 03/20/2018   NA 141 01/15/2019   K 4.5 01/15/2019   CL 101 01/15/2019   CREATININE 0.83 01/15/2019   BUN 16 01/15/2019   CO2 30 01/15/2019   TSH 4.75 (H) 01/15/2019   INR 1.1 11/27/2013   HGBA1C 7.1 (H) 01/15/2019   MICROALBUR 1.2 09/18/2018    Dg Bone Density  Result Date: 04/08/2018 EXAM: DUAL X-RAY ABSORPTIOMETRY (DXA) FOR BONE MINERAL DENSITY IMPRESSION: Dear Dr Ronnald Ramp, [name] completed a FRAX assessment on 04/08/2018 using the [place] (analysis version: 14.10) manufactured by [employer]. The following summarizes the results of our evaluation. PATIENT BIOGRAPHICAL: [name], Kirsten [name] Patient ID: [id number] Birth Date: September 12, 1955 Height:    67.0 in. Gender:     Female    [age]       Weight:    216.4 lbs. Ethnicity:  White                            Exam Date: 04/08/2018 FRAX* RESULTS:  (version: 3.5) 10-year Probability of Fracture1 Major Osteoporotic Fracture2 Hip Fracture 14.5% 1.6% Population: [place] (Caucasian) Risk Factors: History of Fracture (Adult) Based on Femur (Left) Neck BMD 1 -The 10-year probability of fracture may be lower than reported if the patient has received treatment. 2 -Major Osteoporotic Fracture: Clinical Spine, Forearm, Hip or Shoulder *FRAX is a  Materials engineer of the [employer] for Metabolic Bone Disease, a [place] (WHO) [facility]. ASSESSMENT: The probability of a major osteoporotic fracture is 14.5% within the next ten years. The probability of a hip fracture is 1.6% within the next ten years. . Technologist: [employer] PATIENT BIOGRAPHICAL: [name], Kirsten [name] Patient ID: [id number] Birth Date: March 19, 1955 Height: 67.0 in. Gender: Female Exam Date: 04/08/2018 Weight: 216.4 lbs. Indications: Asthma, Caucasian, COPD, Diabetic, History of Fracture (Adult), Hypothyroid, Osteopenia, Postmenopausal Fractures: coccyx, Right  elbow, Right forearm, Right humerus Treatments: Calcium, Levothyroxine, Metformin, Multi-Vitamin ASSESSMENT: The BMD measured at Femur Neck Left is 0.789 g/cm2 with a T-score of -1.8. This patient is considered osteopenic according to [facility]) criteria. The quality of the scan is good. L1 was excluded due to degenerative changes. Site Region Measured Measured WHO Young Adult BMD Date       Age      Classification T-score AP Spine L2-L4 04/08/2018 60.6 Osteopenia -1.4 1.042 g/cm2 DualFemur Neck Left 04/08/2018 60.6 Osteopenia -1.8 0.789 g/cm2 World Health Organization [facility]) criteria for post-menopausal, Caucasian Women: Normal:       T-score at or above -1 SD Osteopenia:   T-score between -1 and -2.5 SD Osteoporosis: T-score at or below -2.5 SD RECOMMENDATIONS: 1. All patients should optimize calcium and vitamin D intake. 2. Consider FDA-approved medical therapies in postmenopausal women and men aged 47 years and older, based on the following: a. A hip or vertebral(clinical or morphometric) fracture b. T-score < -2.5 at the femoral neck or spine after appropriate evaluation to exclude secondary causes c. Low bone mass (T-score between -1.0 and -2.5 at the femoral neck or spine) and a 10-year probability of a hip fracture > 3% or a 10-year probability of a major  osteoporosis-related fracture > 20% based on the [place]-adapted WHO algorithm d. Clinician judgment and/or patient preferences may indicate treatment for people with 10-year fracture probabilities above or below these levels FOLLOW-UP: People with diagnosed cases of osteoporosis or at high risk for fracture should have regular bone mineral density tests. For patients eligible for Medicare, routine testing is allowed once every 2 years. The testing frequency can be increased to one year for patients who have rapidly progressing disease, those who are receiving or discontinuing medical therapy to restore bone mass, or have additional risk factors. I have reviewed this report, and agree with the above findings. [facility] Radiology Electronically Signed   By: Lowella Grip III M.D.   On: 04/08/2018 14:37   Mm 3d Screen Breast Bilateral  Result Date: 04/08/2018 CLINICAL DATA:  Screening. EXAM: DIGITAL SCREENING BILATERAL MAMMOGRAM WITH TOMO AND CAD COMPARISON:  Previous exam(s). ACR Breast Density Category b: There are scattered areas of fibroglandular density. FINDINGS: There are no findings suspicious for malignancy. Images were processed with CAD. IMPRESSION: No mammographic evidence of malignancy. A result letter of this screening mammogram will be mailed directly to the patient. RECOMMENDATION: Screening mammogram in one year. (Code:[id number]-B-01Y) BI-RADS CATEGORY  1: Negative. Electronically Signed   By: Abelardo Diesel M.D.   On: 04/08/2018 14:22    Assessment & Plan:   Kirsten [name] was seen today for hypertension, hypothyroidism, diabetes and hyperlipidemia.  Diagnoses and all orders for this visit:  Essential hypertension, benign- Her blood pressure is adequately well controlled. -     Basic metabolic panel; Future  Paroxysmal atrial fibrillation ([place])- She has good rate and rhythm control.  Will continue anticoagulation with the DOAC. -     TSH; Future -     apixaban (ELIQUIS) 5 MG TABS tablet; Take 1  tablet (5 mg total) by mouth 2 (two) times daily.  Hypothyroidism due to medication- Her TSH is mildly elevated but she clinically appears euthyroid.  Will continue the current dose of levothyroxine. -     TSH; Future -     levothyroxine (SYNTHROID) 100 MCG tablet; TAKE 1 TABLET(100 MCG) BY MOUTH DAILY  Type II diabetes mellitus with manifestations ([place])- She has achieved adequate glycemic control. -  Hemoglobin A1c; Future -     metFORMIN (GLUCOPHAGE) 1000 MG tablet; TAKE 1 TABLET(1000 MG) BY MOUTH TWICE DAILY WITH A MEAL.  Hypertriglyceridemia- I recommended she treat this with icosapent ethyl to reduce the risk of complications like pancreatitis and for cardiovascular risk reduction. -     Cancel: Triglycerides; Future -     Lipid panel; Future -     icosapent Ethyl (VASCEPA) 1 g capsule; Take 2 capsules (2 g total) by mouth 2 (two) times daily.  Vitamin B 12 deficiency -     cyanocobalamin ((VITAMIN B-12)) injection 1,000 mcg  Hyperlipidemia with target LDL less than 100- She has achieved her LDL goal and is doing well on the statin. -     Lipid panel; Future  E. coli UTI- She is asymptomatic and her urine culture is positive only for contaminants.  This is been adequately treated. -     Urine Culture Comprehensive; Future  Other specified hypothyroidism -     levothyroxine (SYNTHROID) 100 MCG tablet; TAKE 1 TABLET(100 MCG) BY MOUTH DAILY  Type 2 diabetes mellitus with complication, without long-term current use of insulin (HCC) -     metFORMIN (GLUCOPHAGE) 1000 MG tablet; TAKE 1 TABLET(1000 MG) BY MOUTH TWICE DAILY WITH A MEAL.   I have discontinued Camera A. Montalvo's spironolactone and nitrofurantoin (macrocrystal-monohydrate). I have also changed her metFORMIN. Additionally, I am having her start on Vascepa. Lastly, I am having her maintain her glucose blood, metoprolol succinate, amiodarone, PARoxetine, atorvastatin, apixaban, and levothyroxine. We administered  cyanocobalamin.  Meds ordered this encounter  Medications  . cyanocobalamin ((VITAMIN B-12)) injection 1,000 mcg  . apixaban (ELIQUIS) 5 MG TABS tablet    Sig: Take 1 tablet (5 mg total) by mouth 2 (two) times daily.    Dispense:  180 tablet    Refill:  1  . levothyroxine (SYNTHROID) 100 MCG tablet    Sig: TAKE 1 TABLET(100 MCG) BY MOUTH DAILY    Dispense:  90 tablet    Refill:  1  . metFORMIN (GLUCOPHAGE) 1000 MG tablet    Sig: TAKE 1 TABLET(1000 MG) BY MOUTH TWICE DAILY WITH A MEAL.    Dispense:  180 tablet    Refill:  1  . icosapent Ethyl (VASCEPA) 1 g capsule    Sig: Take 2 capsules (2 g total) by mouth 2 (two) times daily.    Dispense:  360 capsule    Refill:  1     Follow-up: Return in about 6 months (around 07/16/2019).  [name], MD

## 2019-01-17 LAB — CULTURE, URINE COMPREHENSIVE
MICRO NUMBER:: 1160098
SPECIMEN QUALITY:: ADEQUATE

## 2019-01-19 ENCOUNTER — Encounter: Payer: Self-pay | Admitting: Internal Medicine

## 2019-01-19 MED ORDER — VASCEPA 1 G PO CAPS: 2.0000 g | ORAL_CAPSULE | Freq: Two times a day (BID) | ORAL | 1 refills | Status: DC

## 2019-01-20 DIAGNOSIS — G4733 Obstructive sleep apnea (adult) (pediatric): Secondary | ICD-10-CM | POA: Diagnosis not present

## 2019-01-29 ENCOUNTER — Encounter: Payer: Self-pay | Admitting: Internal Medicine

## 2019-02-02 ENCOUNTER — Telehealth: Payer: Self-pay | Admitting: Internal Medicine

## 2019-02-02 ENCOUNTER — Encounter: Payer: Self-pay | Admitting: Internal Medicine

## 2019-02-02 NOTE — Telephone Encounter (Signed)
Pt called in to follow up on refill request.   Pt says that she is completely out of her medication

## 2019-02-02 NOTE — Telephone Encounter (Signed)
Medication Refill - Medication: Spiralactone 90 day supply  Has the patient contacted their pharmacy? Yes.   (Agent: If no, request that the patient contact the pharmacy for the refill.) (Agent: If yes, when and what did the pharmacy advise?)  Preferred Pharmacy (with phone number or street name): [id number] - , [place] [address]  Agent: Please be advised that RX refills may take up to 3 business days. We ask that you follow-up with your pharmacy.

## 2019-02-03 ENCOUNTER — Other Ambulatory Visit: Payer: Self-pay | Admitting: Internal Medicine

## 2019-02-03 DIAGNOSIS — I10 Essential (primary) hypertension: Secondary | ICD-10-CM

## 2019-02-03 DIAGNOSIS — T502X5A Adverse effect of carbonic-anhydrase inhibitors, benzothiadiazides and other diuretics, initial encounter: Secondary | ICD-10-CM

## 2019-02-03 DIAGNOSIS — E876 Hypokalemia: Secondary | ICD-10-CM

## 2019-02-03 MED ORDER — SPIRONOLACTONE 25 MG PO TABS: 25.0000 mg | ORAL_TABLET | Freq: Every day | ORAL | 1 refills | Status: DC

## 2019-02-03 NOTE — Telephone Encounter (Signed)
Pt contacted and informed rx has been sent.

## 2019-02-03 NOTE — Telephone Encounter (Signed)
Patient is calling to check status of current medication refill . Patient states she out of meds completley    spironolactone (ALDACTONE) 25 MG tablet [[id number]] DISCONTINUED     [id number] [address], [place] [address]  [address] [place] [address]  Phone: [phone] Fax: [phone]

## 2019-02-03 NOTE — Telephone Encounter (Signed)
Pt contacted and confirmed that she has been taking the spironlactone. This medication was dc'ed on 01/15/2019 (last OV).

## 2019-02-09 ENCOUNTER — Ambulatory Visit: Payer: PPO | Attending: Internal Medicine

## 2019-02-09 DIAGNOSIS — Z20828 Contact with and (suspected) exposure to other viral communicable diseases: Secondary | ICD-10-CM | POA: Diagnosis not present

## 2019-02-09 DIAGNOSIS — Z20822 Contact with and (suspected) exposure to covid-19: Secondary | ICD-10-CM

## 2019-02-11 ENCOUNTER — Encounter: Payer: Self-pay | Admitting: Internal Medicine

## 2019-02-11 ENCOUNTER — Ambulatory Visit (INDEPENDENT_AMBULATORY_CARE_PROVIDER_SITE_OTHER): Payer: PPO | Admitting: Internal Medicine

## 2019-02-11 DIAGNOSIS — E118 Type 2 diabetes mellitus with unspecified complications: Secondary | ICD-10-CM

## 2019-02-11 DIAGNOSIS — R062 Wheezing: Secondary | ICD-10-CM | POA: Insufficient documentation

## 2019-02-11 DIAGNOSIS — R059 Cough, unspecified: Secondary | ICD-10-CM

## 2019-02-11 DIAGNOSIS — R05 Cough: Secondary | ICD-10-CM

## 2019-02-11 LAB — NOVEL CORONAVIRUS, NAA: SARS-CoV-2, NAA: NOT DETECTED

## 2019-02-11 MED ORDER — PREDNISONE 10 MG PO TABS: ORAL_TABLET | ORAL | 0 refills | Status: DC

## 2019-02-11 MED ORDER — ALBUTEROL SULFATE HFA 108 (90 BASE) MCG/ACT IN AERS: 2.0000 | INHALATION_SPRAY | Freq: Four times a day (QID) | RESPIRATORY_TRACT | 2 refills | Status: DC | PRN

## 2019-02-11 MED ORDER — LEVOFLOXACIN 500 MG PO TABS: 500.0000 mg | ORAL_TABLET | Freq: Every day | ORAL | 0 refills | Status: AC

## 2019-02-11 MED ORDER — HYDROCODONE-HOMATROPINE 5-1.5 MG/5ML PO SYRP: 5.0000 mL | ORAL_SOLUTION | Freq: Four times a day (QID) | ORAL | 0 refills | Status: AC | PRN

## 2019-02-11 NOTE — Assessment & Plan Note (Signed)
Mild to mod, for predpac asd, inhaler prn,,  to f/u any worsening symptoms or concerns

## 2019-02-11 NOTE — Assessment & Plan Note (Signed)
Mild to mod, c/ bronchitis vs pna, for antibx course,cough med prn,  to f/u any worsening symptoms or concerns

## 2019-02-11 NOTE — Telephone Encounter (Signed)
Called pt and she stated that she wanted to know if Dr. Ronnald Ramp could treat her without a visit?   Per PCP, he prefers to treat in office but can not with patients current symptoms.   Contacted pt and informed of same. Pt will have visit with Dr. Jenny Reichmann.

## 2019-02-11 NOTE — Progress Notes (Signed)
Patient ID: Kirsten [name], female   DOB: 04-Aug-1955, 63 y.o.   MRN: [id number]  Virtual Visit via Video Note  I connected with Kirsten [name] on 02/11/19 at  3:00 PM EST by a video enabled telemedicine application and verified that I am speaking with the correct person using two identifiers.  Location: Patient: at home Provider: at office   I discussed the limitations of evaluation and management by telemedicine and the availability of in person appointments. The patient expressed understanding and agreed to proceed.  History of Present Illness:  Here with 2-3 days acute onset fever, facial pain, pressure, headache, general weakness and malaise, and greenish d/c, with mild ST and cough, but pt denies chest pain, wheezing, increased sob or doe, orthopnea, PND, increased LE swelling, palpitations, dizziness or syncope, except for mild wheezing and increased inhaler use since yesterday.  Was found COVID neg Mon dec 28.   Pt denies polydipsia, polyuria    Past Medical History:  Diagnosis Date  . A-fib ([place])   . Anxiety disorder   . Asthma   . Dyspnea   . Gastroschisis    umphalocele, rotated organs abd repair until age 86  . GERD (gastroesophageal reflux disease)   . HTN (hypertension)   . Hyperlipidemia   . IBS (irritable bowel syndrome)   . Morbid obesity ([place])    Target wt - 185  for BMI < 30  . Obesity   . OSA on CPAP   . Pulmonary fibrosis ([place])   . SBO (small bowel obstruction) ([place])    Resolved with NG/Bowel rest around 2009  . Sleep apnea   . Type II or unspecified type diabetes mellitus without mention of complication, not stated as uncontrolled    Past Surgical History:  Procedure Laterality Date  . BREAST EXCISIONAL BIOPSY Left 03/19/2012   neg  . CESAREAN SECTION    . CHOLECYSTECTOMY  1992  . Newborn Surgery - GI - ORGANS OUTSIDE ABDOMEN    . Small Bowel Repair    . TUBAL LIGATION  1988    reports that she has never smoked. She has never used smokeless  tobacco. She reports current alcohol use of about 2.0 standard drinks of alcohol per week. She reports that she does not use drugs. family history includes Colon cancer in her father; Diabetes in her maternal grandmother; Diabetes type I in her son; Diabetes type II in her father; Lymphoma in her mother. Allergies  Allergen Reactions  . Factive [Gemifloxacin Mesylate] Rash  . Crestor [Rosuvastatin]     GI upset  . Sulfonamide Derivatives     REACTION: rash  . Gemifloxacin Rash  . Penicillins Hives and Rash    Has patient had a PCN reaction causing immediate rash, facial/tongue/throat swelling, SOB or lightheadedness with hypotension: No Has patient had a PCN reaction causing severe rash involving mucus membranes or skin necrosis: No Has patient had a PCN reaction that required hospitalization No Has patient had a PCN reaction occurring within the last 10 years: No If all of the above answers are "NO", then may proceed with Cephalosporin use.    Current Outpatient Medications on File Prior to Visit  Medication Sig Dispense Refill  . amiodarone (PACERONE) 200 MG tablet Take 200 mg daily by mouth.    Marland Kitchen apixaban (ELIQUIS) 5 MG TABS tablet Take 1 tablet (5 mg total) by mouth 2 (two) times daily. 180 tablet 1  . atorvastatin (LIPITOR) 40 MG tablet TAKE 1 TABLET(40 MG) BY MOUTH  DAILY 90 tablet 1  . glucose blood (ONE TOUCH ULTRA TEST) test strip Use as instructed 100 each 12  . icosapent Ethyl (VASCEPA) 1 g capsule Take 2 capsules (2 g total) by mouth 2 (two) times daily. 360 capsule 1  . levothyroxine (SYNTHROID) 100 MCG tablet TAKE 1 TABLET(100 MCG) BY MOUTH DAILY 90 tablet 1  . metFORMIN (GLUCOPHAGE) 1000 MG tablet TAKE 1 TABLET(1000 MG) BY MOUTH TWICE DAILY WITH A MEAL. 180 tablet 1  . metoprolol succinate (TOPROL-XL) 50 MG 24 hr tablet Take 50 mg by mouth daily.     Marland Kitchen PARoxetine (PAXIL CR) 25 MG 24 hr tablet Take 1 tablet (25 mg total) by mouth daily. 30 tablet 9  . spironolactone  (ALDACTONE) 25 MG tablet Take 1 tablet (25 mg total) by mouth daily. 90 tablet 1   No current facility-administered medications on file prior to visit.    Observations/Objective: Alert, NAD, appropriate mood and affect, resps normal, cn 2-12 intact, moves all 4s, no visible rash or swelling Lab Results  Component Value Date   WBC 10.1 03/20/2018   HGB 13.7 03/20/2018   HCT 41.1 03/20/2018   PLT 314.0 03/20/2018   GLUCOSE 167 (H) 01/15/2019   CHOL 128 01/15/2019   TRIG 230.0 (H) 01/15/2019   HDL 41.80 01/15/2019   LDLDIRECT 63.0 01/15/2019   LDLCALC SEE COMMENT 11/26/2013   ALT 28 03/20/2018   AST 20 03/20/2018   NA 141 01/15/2019   K 4.5 01/15/2019   CL 101 01/15/2019   CREATININE 0.83 01/15/2019   BUN 16 01/15/2019   CO2 30 01/15/2019   TSH 4.75 (H) 01/15/2019   INR 1.1 11/27/2013   HGBA1C 7.1 (H) 01/15/2019   MICROALBUR 1.2 09/18/2018   Assessment and Plan: See notes  Follow Up Instructions: See notes   I discussed the assessment and treatment plan with the patient. The patient was provided an opportunity to ask questions and all were answered. The patient agreed with the plan and demonstrated an understanding of the instructions.   The patient was advised to call back or seek an in-person evaluation if the symptoms worsen or if the condition fails to improve as anticipated.   Cathlean Cower, MD

## 2019-02-11 NOTE — Assessment & Plan Note (Signed)
stable overall by history and exam, recent data reviewed with pt, and pt to continue medical treatment as before,  to f/u any worsening symptoms or concerns  

## 2019-02-11 NOTE — Patient Instructions (Signed)
Please take all new medication as prescribed - the antibiotic, cough medicine, prednisone, and inhaler as needed  Please continue all other medications as before, and refills have been done if requested.  Please have the pharmacy call with any other refills you may need.  Please keep your appointments with your specialists as you may have planned

## 2019-02-16 ENCOUNTER — Ambulatory Visit: Payer: PPO | Attending: Internal Medicine

## 2019-02-16 DIAGNOSIS — Z20822 Contact with and (suspected) exposure to covid-19: Secondary | ICD-10-CM

## 2019-02-18 LAB — NOVEL CORONAVIRUS, NAA: SARS-CoV-2, NAA: NOT DETECTED

## 2019-02-21 ENCOUNTER — Encounter: Payer: Self-pay | Admitting: Internal Medicine

## 2019-02-25 ENCOUNTER — Other Ambulatory Visit: Payer: Self-pay | Admitting: Certified Nurse Midwife

## 2019-02-25 DIAGNOSIS — Z1231 Encounter for screening mammogram for malignant neoplasm of breast: Secondary | ICD-10-CM

## 2019-03-12 ENCOUNTER — Other Ambulatory Visit: Payer: Self-pay | Admitting: Internal Medicine

## 2019-03-12 DIAGNOSIS — F5105 Insomnia due to other mental disorder: Secondary | ICD-10-CM

## 2019-03-12 DIAGNOSIS — F409 Phobic anxiety disorder, unspecified: Secondary | ICD-10-CM

## 2019-03-13 ENCOUNTER — Other Ambulatory Visit: Payer: Self-pay | Admitting: Internal Medicine

## 2019-03-13 DIAGNOSIS — G4730 Sleep apnea, unspecified: Secondary | ICD-10-CM

## 2019-03-13 DIAGNOSIS — G4700 Insomnia, unspecified: Secondary | ICD-10-CM

## 2019-03-13 DIAGNOSIS — F409 Phobic anxiety disorder, unspecified: Secondary | ICD-10-CM

## 2019-03-13 MED ORDER — DAYVIGO 10 MG PO TABS: 1.0000 | ORAL_TABLET | Freq: Every evening | ORAL | 3 refills | Status: DC | PRN

## 2019-03-24 DIAGNOSIS — G4733 Obstructive sleep apnea (adult) (pediatric): Secondary | ICD-10-CM | POA: Diagnosis not present

## 2019-03-26 ENCOUNTER — Other Ambulatory Visit: Payer: Self-pay | Admitting: Internal Medicine

## 2019-03-26 DIAGNOSIS — G4700 Insomnia, unspecified: Secondary | ICD-10-CM

## 2019-03-26 DIAGNOSIS — G4730 Sleep apnea, unspecified: Secondary | ICD-10-CM

## 2019-03-26 DIAGNOSIS — F409 Phobic anxiety disorder, unspecified: Secondary | ICD-10-CM

## 2019-03-26 MED ORDER — ZOLPIDEM TARTRATE 10 MG PO TABS: 10.0000 mg | ORAL_TABLET | Freq: Every evening | ORAL | 5 refills | Status: DC | PRN

## 2019-04-10 ENCOUNTER — Ambulatory Visit
Admission: RE | Admit: 2019-04-10 | Discharge: 2019-04-10 | Disposition: A | Discharge status: Home / Self Care | Payer: PPO | Source: Ambulatory Visit | Attending: Certified Nurse Midwife | Admitting: Certified Nurse Midwife

## 2019-04-10 DIAGNOSIS — Z1231 Encounter for screening mammogram for malignant neoplasm of breast: Secondary | ICD-10-CM

## 2019-05-05 ENCOUNTER — Telehealth: Payer: Self-pay | Admitting: Internal Medicine

## 2019-05-05 NOTE — Progress Notes (Signed)
  Chronic Care Management   Note  05/05/2019 Name: ORETA SOLOWAY MRN: [id number] DOB: August 27, 1955  JADAN ROUILLARD is a 64 y.o. year old female who is a primary care patient of [name], MD. I reached out to [name] by phone today in response to a referral sent by Ms. Bonnita Levan Gibbs's PCP, [name], MD.   Ms. Pizano was given information about Chronic Care Management services today including:  1. CCM service includes personalized support from designated clinical staff supervised by her physician, including individualized plan of care and coordination with other care providers 2. 24/7 contact phone numbers for assistance for urgent and routine care needs. 3. Service will only be billed when office clinical staff spend 20 minutes or more in a month to coordinate care. 4. Only one practitioner may furnish and bill the service in a calendar month. 5. The patient may stop CCM services at any time (effective at the end of the month) by phone call to the office staff.   Patient did not agree to enrollment in care management services and does not wish to consider at this time.  Follow up plan:   Raynicia Dukes [employer] Scheduler

## 2019-05-18 ENCOUNTER — Ambulatory Visit: Payer: PPO | Admitting: Internal Medicine

## 2019-05-20 ENCOUNTER — Ambulatory Visit: Payer: PPO | Attending: Internal Medicine

## 2019-05-20 DIAGNOSIS — Z23 Encounter for immunization: Secondary | ICD-10-CM

## 2019-05-20 NOTE — Progress Notes (Signed)
   Covid-19 Vaccination Clinic  Name:  ZEIDY TAYAG    MRN: [id number] DOB: 03/19/1955  05/20/2019  Ms. Sermon was observed post Covid-19 immunization for 30 minutes based on pre-vaccination screening without incident. She was provided with Vaccine Information Sheet and instruction to access the V-Safe system.   Ms. Zell was instructed to call 911 with any severe reactions post vaccine: Marland Kitchen Difficulty breathing  . Swelling of face and throat  . A fast heartbeat  . A bad rash all over body  . Dizziness and weakness   Immunizations Administered    Name Date Dose VIS Date Route   Pfizer COVID-19 Vaccine 05/20/2019 11:22 AM 0.3 mL 01/23/2019 Intramuscular   Manufacturer: [place]   Lot: [id number]   [place]: [id number]

## 2019-05-23 ENCOUNTER — Other Ambulatory Visit: Payer: Self-pay

## 2019-05-23 ENCOUNTER — Emergency Department: Payer: PPO

## 2019-05-23 ENCOUNTER — Inpatient Hospital Stay
Admission: EM | Admit: 2019-05-23 | Discharge: 2019-05-26 | DRG: 202 | Disposition: A | Discharge status: Home Health | Payer: PPO | Attending: Internal Medicine | Admitting: Internal Medicine

## 2019-05-23 DIAGNOSIS — Z79899 Other long term (current) drug therapy: Secondary | ICD-10-CM

## 2019-05-23 DIAGNOSIS — T380X5A Adverse effect of glucocorticoids and synthetic analogues, initial encounter: Secondary | ICD-10-CM | POA: Diagnosis not present

## 2019-05-23 DIAGNOSIS — J4541 Moderate persistent asthma with (acute) exacerbation: Principal | ICD-10-CM | POA: Diagnosis present

## 2019-05-23 DIAGNOSIS — E119 Type 2 diabetes mellitus without complications: Secondary | ICD-10-CM

## 2019-05-23 DIAGNOSIS — J45901 Unspecified asthma with (acute) exacerbation: Secondary | ICD-10-CM | POA: Diagnosis present

## 2019-05-23 DIAGNOSIS — I480 Paroxysmal atrial fibrillation: Secondary | ICD-10-CM | POA: Diagnosis present

## 2019-05-23 DIAGNOSIS — J9621 Acute and chronic respiratory failure with hypoxia: Secondary | ICD-10-CM | POA: Diagnosis present

## 2019-05-23 DIAGNOSIS — Z7901 Long term (current) use of anticoagulants: Secondary | ICD-10-CM | POA: Diagnosis not present

## 2019-05-23 DIAGNOSIS — E875 Hyperkalemia: Secondary | ICD-10-CM | POA: Diagnosis not present

## 2019-05-23 DIAGNOSIS — Z880 Allergy status to penicillin: Secondary | ICD-10-CM

## 2019-05-23 DIAGNOSIS — Z888 Allergy status to other drugs, medicaments and biological substances status: Secondary | ICD-10-CM | POA: Diagnosis not present

## 2019-05-23 DIAGNOSIS — E785 Hyperlipidemia, unspecified: Secondary | ICD-10-CM | POA: Diagnosis not present

## 2019-05-23 DIAGNOSIS — J9601 Acute respiratory failure with hypoxia: Secondary | ICD-10-CM | POA: Diagnosis present

## 2019-05-23 DIAGNOSIS — I10 Essential (primary) hypertension: Secondary | ICD-10-CM | POA: Diagnosis not present

## 2019-05-23 DIAGNOSIS — Z807 Family history of other malignant neoplasms of lymphoid, hematopoietic and related tissues: Secondary | ICD-10-CM

## 2019-05-23 DIAGNOSIS — Z800 Family history of malignant neoplasm of digestive organs: Secondary | ICD-10-CM | POA: Diagnosis not present

## 2019-05-23 DIAGNOSIS — Z20822 Contact with and (suspected) exposure to covid-19: Secondary | ICD-10-CM | POA: Diagnosis present

## 2019-05-23 DIAGNOSIS — R945 Abnormal results of liver function studies: Secondary | ICD-10-CM

## 2019-05-23 DIAGNOSIS — J9600 Acute respiratory failure, unspecified whether with hypoxia or hypercapnia: Secondary | ICD-10-CM | POA: Diagnosis present

## 2019-05-23 DIAGNOSIS — T500X5A Adverse effect of mineralocorticoids and their antagonists, initial encounter: Secondary | ICD-10-CM | POA: Diagnosis not present

## 2019-05-23 DIAGNOSIS — Z9989 Dependence on other enabling machines and devices: Secondary | ICD-10-CM | POA: Diagnosis present

## 2019-05-23 DIAGNOSIS — G4733 Obstructive sleep apnea (adult) (pediatric): Secondary | ICD-10-CM | POA: Diagnosis not present

## 2019-05-23 DIAGNOSIS — F418 Other specified anxiety disorders: Secondary | ICD-10-CM | POA: Diagnosis present

## 2019-05-23 DIAGNOSIS — K219 Gastro-esophageal reflux disease without esophagitis: Secondary | ICD-10-CM | POA: Diagnosis present

## 2019-05-23 DIAGNOSIS — R0602 Shortness of breath: Secondary | ICD-10-CM | POA: Diagnosis not present

## 2019-05-23 DIAGNOSIS — Z833 Family history of diabetes mellitus: Secondary | ICD-10-CM | POA: Diagnosis not present

## 2019-05-23 DIAGNOSIS — Z882 Allergy status to sulfonamides status: Secondary | ICD-10-CM

## 2019-05-23 DIAGNOSIS — J449 Chronic obstructive pulmonary disease, unspecified: Secondary | ICD-10-CM | POA: Diagnosis not present

## 2019-05-23 DIAGNOSIS — E039 Hypothyroidism, unspecified: Secondary | ICD-10-CM | POA: Diagnosis not present

## 2019-05-23 DIAGNOSIS — J8410 Pulmonary fibrosis, unspecified: Secondary | ICD-10-CM | POA: Diagnosis present

## 2019-05-23 DIAGNOSIS — R7989 Other specified abnormal findings of blood chemistry: Secondary | ICD-10-CM | POA: Diagnosis present

## 2019-05-23 DIAGNOSIS — E1165 Type 2 diabetes mellitus with hyperglycemia: Secondary | ICD-10-CM | POA: Diagnosis not present

## 2019-05-23 DIAGNOSIS — Z79890 Hormone replacement therapy (postmenopausal): Secondary | ICD-10-CM

## 2019-05-23 LAB — CBC WITH DIFFERENTIAL/PLATELET
Abs Immature Granulocytes: 0.05 10*3/uL (ref 0.00–0.07)
Basophils Absolute: 0.1 10*3/uL (ref 0.0–0.1)
Basophils Relative: 1 %
Eosinophils Absolute: 0.3 10*3/uL (ref 0.0–0.5)
Eosinophils Relative: 3 %
HCT: 42.7 % (ref 36.0–46.0)
Hemoglobin: 13.5 g/dL (ref 12.0–15.0)
Immature Granulocytes: 1 %
Lymphocytes Relative: 44 %
Lymphs Abs: 4.7 10*3/uL — ABNORMAL HIGH (ref 0.7–4.0)
MCH: 30.1 pg (ref 26.0–34.0)
MCHC: 31.6 g/dL (ref 30.0–36.0)
MCV: 95.1 fL (ref 80.0–100.0)
Monocytes Absolute: 0.8 10*3/uL (ref 0.1–1.0)
Monocytes Relative: 7 %
Neutro Abs: 4.8 10*3/uL (ref 1.7–7.7)
Neutrophils Relative %: 44 %
Platelets: 306 10*3/uL (ref 150–400)
RBC: 4.49 MIL/uL (ref 3.87–5.11)
RDW: 13.2 % (ref 11.5–15.5)
WBC: 10.7 10*3/uL — ABNORMAL HIGH (ref 4.0–10.5)
nRBC: 0 % (ref 0.0–0.2)

## 2019-05-23 LAB — POC SARS CORONAVIRUS 2 AG: SARS Coronavirus 2 Ag: NEGATIVE

## 2019-05-23 LAB — COMPREHENSIVE METABOLIC PANEL
ALT: 124 U/L — ABNORMAL HIGH (ref 0–44)
AST: 89 U/L — ABNORMAL HIGH (ref 15–41)
Albumin: 4 g/dL (ref 3.5–5.0)
Alkaline Phosphatase: 70 U/L (ref 38–126)
Anion gap: 8 (ref 5–15)
BUN: 18 mg/dL (ref 8–23)
CO2: 29 mmol/L (ref 22–32)
Calcium: 9.7 mg/dL (ref 8.9–10.3)
Chloride: 102 mmol/L (ref 98–111)
Creatinine, Ser: 0.91 mg/dL (ref 0.44–1.00)
GFR calc Af Amer: >60 mL/min (ref >60)
GFR calc non Af Amer: >60 mL/min (ref >60)
Glucose, Bld: 202 mg/dL — ABNORMAL HIGH (ref 70–99)
Potassium: 5.1 mmol/L (ref 3.5–5.1)
Sodium: 139 mmol/L (ref 135–145)
Total Bilirubin: 0.7 mg/dL (ref 0.3–1.2)
Total Protein: 6.8 g/dL (ref 6.5–8.1)

## 2019-05-23 LAB — TROPONIN I (HIGH SENSITIVITY): Troponin I (High Sensitivity): 14 ng/L (ref <18)

## 2019-05-23 LAB — SARS CORONAVIRUS 2 (TAT 6-24 HRS): SARS Coronavirus 2: NEGATIVE

## 2019-05-23 LAB — GLUCOSE, CAPILLARY
Glucose-Capillary: 271 mg/dL — ABNORMAL HIGH (ref 70–99)
Glucose-Capillary: 347 mg/dL — ABNORMAL HIGH (ref 70–99)
Glucose-Capillary: 195 mg/dL — ABNORMAL HIGH (ref 70–99)

## 2019-05-23 LAB — HIV ANTIBODY (ROUTINE TESTING W REFLEX): HIV Screen 4th Generation wRfx: NONREACTIVE

## 2019-05-23 LAB — POC SARS CORONAVIRUS 2 AG -  ED: SARS Coronavirus 2 Ag: NEGATIVE

## 2019-05-23 LAB — BRAIN NATRIURETIC PEPTIDE: B Natriuretic Peptide: 73 pg/mL (ref 0.0–100.0)

## 2019-05-23 MED ORDER — AMIODARONE HCL 200 MG PO TABS
200.0000 mg | ORAL_TABLET | Freq: Every day | ORAL | Status: DC
  Administered 2019-05-23 – 2019-05-26 (×4): 200 mg via ORAL
  Filled 2019-05-23 (×5): qty 1

## 2019-05-23 MED ORDER — ONDANSETRON HCL 4 MG/2ML IJ SOLN: 4.0000 mg | Freq: Three times a day (TID) | INTRAMUSCULAR | Status: DC | PRN

## 2019-05-23 MED ORDER — ZOLPIDEM TARTRATE 5 MG PO TABS
10.0000 mg | ORAL_TABLET | Freq: Every evening | ORAL | Status: DC | PRN
  Administered 2019-05-24 (×2): 10 mg via ORAL
  Filled 2019-05-23 (×2): qty 2

## 2019-05-23 MED ORDER — METFORMIN HCL 500 MG PO TABS: 1000.0000 mg | ORAL_TABLET | Freq: Every day | ORAL | Status: DC

## 2019-05-23 MED ORDER — ALBUTEROL SULFATE (2.5 MG/3ML) 0.083% IN NEBU
2.5000 mg | INHALATION_SOLUTION | Freq: Once | RESPIRATORY_TRACT | Status: AC
  Administered 2019-05-23: 11:00:00 2.5 mg via RESPIRATORY_TRACT
  Filled 2019-05-23: qty 3

## 2019-05-23 MED ORDER — OXYCODONE HCL 5 MG PO TABS
5.0000 mg | ORAL_TABLET | Freq: Four times a day (QID) | ORAL | Status: DC | PRN
  Administered 2019-05-23 – 2019-05-25 (×2): 5 mg via ORAL
  Filled 2019-05-23 (×2): qty 1

## 2019-05-23 MED ORDER — METHYLPREDNISOLONE SODIUM SUCC 125 MG IJ SOLR
125.0000 mg | INTRAMUSCULAR | Status: AC
  Administered 2019-05-23: 125 mg via INTRAVENOUS
  Filled 2019-05-23: qty 2

## 2019-05-23 MED ORDER — APIXABAN 5 MG PO TABS
5.0000 mg | ORAL_TABLET | Freq: Two times a day (BID) | ORAL | Status: DC
  Administered 2019-05-23 – 2019-05-26 (×7): 5 mg via ORAL
  Filled 2019-05-23 (×7): qty 1

## 2019-05-23 MED ORDER — AZITHROMYCIN 500 MG PO TABS
500.0000 mg | ORAL_TABLET | Freq: Every day | ORAL | Status: DC
  Administered 2019-05-24 – 2019-05-26 (×3): 500 mg via ORAL
  Filled 2019-05-23 (×3): qty 1

## 2019-05-23 MED ORDER — ACETAMINOPHEN 500 MG PO TABS
1000.0000 mg | ORAL_TABLET | ORAL | Status: AC
  Administered 2019-05-23: 11:00:00 1000 mg via ORAL
  Filled 2019-05-23: qty 2

## 2019-05-23 MED ORDER — LEVOTHYROXINE SODIUM 100 MCG PO TABS
100.0000 ug | ORAL_TABLET | Freq: Every day | ORAL | Status: DC
  Administered 2019-05-24 – 2019-05-26 (×3): 100 ug via ORAL
  Filled 2019-05-23 (×2): qty 1
  Filled 2019-05-23: qty 2

## 2019-05-23 MED ORDER — INSULIN ASPART 100 UNIT/ML ~~LOC~~ SOLN
0.0000 [IU] | Freq: Three times a day (TID) | SUBCUTANEOUS | Status: DC
  Administered 2019-05-23: 7 [IU] via SUBCUTANEOUS
  Administered 2019-05-23 – 2019-05-24 (×2): 5 [IU] via SUBCUTANEOUS
  Administered 2019-05-24: 12:00:00 7 [IU] via SUBCUTANEOUS
  Administered 2019-05-24: 18:00:00 2 [IU] via SUBCUTANEOUS
  Administered 2019-05-25 (×3): 5 [IU] via SUBCUTANEOUS
  Administered 2019-05-26: 10:00:00 7 [IU] via SUBCUTANEOUS
  Administered 2019-05-26: 5 [IU] via SUBCUTANEOUS
  Filled 2019-05-23 (×11): qty 1

## 2019-05-23 MED ORDER — IPRATROPIUM-ALBUTEROL 0.5-2.5 (3) MG/3ML IN SOLN
3.0000 mL | Freq: Once | RESPIRATORY_TRACT | Status: AC
  Administered 2019-05-23: 3 mL via RESPIRATORY_TRACT
  Filled 2019-05-23: qty 3

## 2019-05-23 MED ORDER — IPRATROPIUM BROMIDE 0.02 % IN SOLN
2.5000 mL | RESPIRATORY_TRACT | Status: DC
  Administered 2019-05-24 (×2): 0.5 mg via RESPIRATORY_TRACT
  Filled 2019-05-23 (×2): qty 2.5

## 2019-05-23 MED ORDER — SODIUM CHLORIDE 0.9 % IV BOLUS
500.0000 mL | Freq: Once | INTRAVENOUS | Status: AC
  Administered 2019-05-23: 08:00:00 500 mL via INTRAVENOUS

## 2019-05-23 MED ORDER — SPIRONOLACTONE 25 MG PO TABS
25.0000 mg | ORAL_TABLET | Freq: Every day | ORAL | Status: DC
  Administered 2019-05-23 – 2019-05-24 (×2): 25 mg via ORAL
  Filled 2019-05-23 (×3): qty 1

## 2019-05-23 MED ORDER — METHYLPREDNISOLONE SODIUM SUCC 125 MG IJ SOLR
60.0000 mg | Freq: Two times a day (BID) | INTRAMUSCULAR | Status: DC
  Administered 2019-05-23 – 2019-05-25 (×4): 60 mg via INTRAVENOUS
  Filled 2019-05-23 (×4): qty 2

## 2019-05-23 MED ORDER — LEVOTHYROXINE SODIUM 50 MCG PO TABS
100.0000 ug | ORAL_TABLET | ORAL | Status: AC
  Administered 2019-05-23: 100 ug via ORAL
  Filled 2019-05-23: qty 2

## 2019-05-23 MED ORDER — HYDRALAZINE HCL 25 MG PO TABS: 25.0000 mg | ORAL_TABLET | Freq: Three times a day (TID) | ORAL | Status: DC | PRN

## 2019-05-23 MED ORDER — ATORVASTATIN CALCIUM 20 MG PO TABS
40.0000 mg | ORAL_TABLET | Freq: Every day | ORAL | Status: DC
  Administered 2019-05-24 – 2019-05-25 (×2): 40 mg via ORAL
  Filled 2019-05-23 (×2): qty 2

## 2019-05-23 MED ORDER — METOPROLOL SUCCINATE ER 50 MG PO TB24
50.0000 mg | ORAL_TABLET | Freq: Every day | ORAL | Status: DC
  Administered 2019-05-23 – 2019-05-26 (×4): 50 mg via ORAL
  Filled 2019-05-23 (×4): qty 1

## 2019-05-23 MED ORDER — PAROXETINE HCL ER 12.5 MG PO TB24
25.0000 mg | ORAL_TABLET | Freq: Every day | ORAL | Status: DC
  Administered 2019-05-24 – 2019-05-26 (×3): 25 mg via ORAL
  Filled 2019-05-23 (×4): qty 2

## 2019-05-23 MED ORDER — ALBUTEROL SULFATE (2.5 MG/3ML) 0.083% IN NEBU
2.5000 mg | INHALATION_SOLUTION | RESPIRATORY_TRACT | Status: DC | PRN
  Administered 2019-05-24: 05:00:00 2.5 mg via RESPIRATORY_TRACT
  Filled 2019-05-23 (×2): qty 3

## 2019-05-23 MED ORDER — INSULIN ASPART 100 UNIT/ML ~~LOC~~ SOLN
0.0000 [IU] | Freq: Every day | SUBCUTANEOUS | Status: DC
  Administered 2019-05-24 – 2019-05-25 (×2): 4 [IU] via SUBCUTANEOUS
  Filled 2019-05-23 (×2): qty 1

## 2019-05-23 MED ORDER — IPRATROPIUM-ALBUTEROL 0.5-2.5 (3) MG/3ML IN SOLN
3.0000 mL | Freq: Once | RESPIRATORY_TRACT | Status: AC
  Administered 2019-05-23: 09:00:00 3 mL via RESPIRATORY_TRACT
  Filled 2019-05-23: qty 6

## 2019-05-23 MED ORDER — IPRATROPIUM-ALBUTEROL 0.5-2.5 (3) MG/3ML IN SOLN
3.0000 mL | Freq: Once | RESPIRATORY_TRACT | Status: AC
  Administered 2019-05-23: 09:00:00 3 mL via RESPIRATORY_TRACT

## 2019-05-23 MED ORDER — IBUPROFEN 400 MG PO TABS: 400.0000 mg | ORAL_TABLET | Freq: Four times a day (QID) | ORAL | Status: DC | PRN

## 2019-05-23 MED ORDER — DM-GUAIFENESIN ER 30-600 MG PO TB12
1.0000 | ORAL_TABLET | Freq: Two times a day (BID) | ORAL | Status: DC
  Administered 2019-05-23 – 2019-05-26 (×7): 1 via ORAL
  Filled 2019-05-23 (×7): qty 1

## 2019-05-23 MED ORDER — AZITHROMYCIN 500 MG PO TABS
500.0000 mg | ORAL_TABLET | Freq: Once | ORAL | Status: AC
  Administered 2019-05-23: 500 mg via ORAL
  Filled 2019-05-23: qty 1

## 2019-05-23 MED ORDER — SODIUM CHLORIDE 0.9 % IV SOLN
1.0000 g | INTRAVENOUS | Status: DC
  Administered 2019-05-23 – 2019-05-26 (×4): 1 g via INTRAVENOUS
  Filled 2019-05-23: qty 1
  Filled 2019-05-23: qty 10
  Filled 2019-05-23: qty 1
  Filled 2019-05-23 (×2): qty 10

## 2019-05-23 MED ORDER — IBUPROFEN 400 MG PO TABS
200.0000 mg | ORAL_TABLET | Freq: Four times a day (QID) | ORAL | Status: DC | PRN
  Filled 2019-05-23: qty 1

## 2019-05-23 MED ORDER — MAGNESIUM SULFATE 2 GM/50ML IV SOLN
2.0000 g | Freq: Once | INTRAVENOUS | Status: AC
  Administered 2019-05-23: 2 g via INTRAVENOUS
  Filled 2019-05-23: qty 50

## 2019-05-23 NOTE — ED Triage Notes (Signed)
Patient reports shortness of breath and congestion since Friday.

## 2019-05-23 NOTE — ED Notes (Signed)
This RN to bedside, introduced self to patient and husband. Pt visualized laying in bed at this time. VSS upon assessment, pt remains on 2L via [place]. IV initiated by this RN. Medications administered per EDP order. Pt tolerated well. POC Covid swab collected by this RN, this RN explained if rapid covid negative would need to collect nasopharyngeal swab, if rapid covid swab + pt's husband would not be allowed to stay at bedside per policy. Pt's husband and patient state understanding at this time. Pt and husband deny further needs at this time, call bell within reach.

## 2019-05-23 NOTE — ED Notes (Signed)
PT at bedside.

## 2019-05-23 NOTE — H&P (Signed)
History and Physical    Kirsten [name] WFU:[id number] DOB: 1956-02-12 DOA: 05/23/2019  Referring MD/NP/PA:   PCP: [name], MD   Patient coming from:  The patient is coming from home.  At baseline, pt is independent for most of ADL.        Chief Complaint: SOB  HPI: Kirsten [name] is a 64 y.o. female with medical history significant of hypertension, hyperlipidemia, diabetes mellitus, asthma, pulmonary fibrosis, GERD, hypothyroidism, depression, anxiety, OSA on CPAP, IBS, small bowel obstruction, gastroschisis, atrial fibrillation on Eliquis, who presents with shortness of breath.  Patient states that she has been having shortness of breath for more than 2 days, which has been progressively worsening.  She has productive cough with greenish colored sputum production.  She has chills and fever of 101.4 at home.  No chest pain.  Patient states that she had second dose of COVID-19 vaccine on Wednesday.  Denies nausea, vomiting, diarrhea, abdominal pain, symptoms of UTI or unilateral weakness.  ED Course: pt was found to have WBC 10.7, troponin 14, BNP 73, pending COVID-19 PCR, electrolytes renal function okay, temperature 100, blood pressure 137/53, heart rate 74, oxygen saturation 90% on room air, 97% on 2 L nasal cannula oxygen, tachypnea, chest x-ray is negative for infiltration.  Patient is admitted to progressive unit as inpatient.  Review of Systems:   General: has fevers, chills, no body weight gain, has fatigue HEENT: no blurry vision, hearing changes or sore throat Respiratory: has dyspnea, coughing, wheezing CV: no chest pain, no palpitations GI: no nausea, vomiting, abdominal pain, diarrhea, constipation GU: no dysuria, burning on urination, increased urinary frequency, hematuria  Ext: no leg edema Neuro: no unilateral weakness, numbness, or tingling, no vision change or hearing loss Skin: no rash, no skin tear. MSK: No muscle spasm, no deformity, no limitation of range  of movement in spin Heme: No easy bruising.  Travel history: No recent long distant travel.  Allergy:  Allergies  Allergen Reactions  . Factive [Gemifloxacin Mesylate] Rash  . Crestor [Rosuvastatin]     GI upset  . Sulfonamide Derivatives     REACTION: rash  . Gemifloxacin Rash  . Penicillins Hives and Rash    Has patient had a PCN reaction causing immediate rash, facial/tongue/throat swelling, SOB or lightheadedness with hypotension: No Has patient had a PCN reaction causing severe rash involving mucus membranes or skin necrosis: No Has patient had a PCN reaction that required hospitalization No Has patient had a PCN reaction occurring within the last 10 years: No If all of the above answers are "NO", then may proceed with Cephalosporin use.     Past Medical History:  Diagnosis Date  . A-fib ([place])   . Anxiety disorder   . Asthma   . Dyspnea   . Gastroschisis    umphalocele, rotated organs abd repair until age 37  . GERD (gastroesophageal reflux disease)   . HTN (hypertension)   . Hyperlipidemia   . IBS (irritable bowel syndrome)   . Morbid obesity ([place])    Target wt - 185  for BMI < 30  . Obesity   . OSA on CPAP   . Pulmonary fibrosis ([place])   . SBO (small bowel obstruction) ([place])    Resolved with NG/Bowel rest around 2009  . Sleep apnea   . Type II or unspecified type diabetes mellitus without mention of complication, not stated as uncontrolled     Past Surgical History:  Procedure Laterality Date  . BREAST  EXCISIONAL BIOPSY Left 03/19/2012   neg  . CESAREAN SECTION    . CHOLECYSTECTOMY  1992  . Newborn Surgery - GI - ORGANS OUTSIDE ABDOMEN    . Small Bowel Repair    . TUBAL LIGATION  1988    Social History:  reports that she has never smoked. She has never used smokeless tobacco. She reports current alcohol use of about 2.0 standard drinks of alcohol per week. She reports that she does not use drugs.  Family History:  Family History  Problem Relation Age  of Onset  . Lymphoma Mother   . Diabetes type II Father   . Colon cancer Father   . Diabetes type I Son   . Diabetes Maternal Grandmother   . Goiter Neg Hx   . Breast cancer Neg Hx   . Ovarian cancer Neg Hx      Prior to Admission medications   Medication Sig Start Date End Date Taking? Authorizing Provider  albuterol (VENTOLIN HFA) 108 (90 Base) MCG/ACT inhaler Inhale 2 puffs into the lungs every 6 (six) hours as needed for wheezing or shortness of breath. 02/11/19   Biagio Borg, MD  amiodarone (PACERONE) 200 MG tablet Take 200 mg daily by mouth.    [provider]  apixaban (ELIQUIS) 5 MG TABS tablet Take 1 tablet (5 mg total) by mouth 2 (two) times daily. 01/15/19   [name], MD  atorvastatin (LIPITOR) 40 MG tablet TAKE 1 TABLET(40 MG) BY MOUTH DAILY 12/29/18   [name], MD  glucose blood (ONE TOUCH ULTRA TEST) test strip Use as instructed 06/03/13   Biagio Borg, MD  icosapent Ethyl (VASCEPA) 1 g capsule Take 2 capsules (2 g total) by mouth 2 (two) times daily. 01/19/19   [name], MD  levothyroxine (SYNTHROID) 100 MCG tablet TAKE 1 TABLET(100 MCG) BY MOUTH DAILY 01/15/19   [name], MD  metFORMIN (GLUCOPHAGE) 1000 MG tablet TAKE 1 TABLET(1000 MG) BY MOUTH TWICE DAILY WITH A MEAL. 01/15/19   [name], MD  metoprolol succinate (TOPROL-XL) 50 MG 24 hr tablet Take 50 mg by mouth daily.  12/12/15   [provider]  PARoxetine (PAXIL CR) 25 MG 24 hr tablet Take 1 tablet (25 mg total) by mouth daily. 09/22/18   Philip Aspen, CNM  spironolactone (ALDACTONE) 25 MG tablet Take 1 tablet (25 mg total) by mouth daily. 02/03/19   [name], MD  zolpidem (AMBIEN) 10 MG tablet Take 1 tablet (10 mg total) by mouth at bedtime as needed. 03/26/19   [name], MD    Physical Exam: Vitals:   05/23/19 0800 05/23/19 0830 05/23/19 0900 05/23/19 1042  BP: 132/82 126/62 (!) 137/53 (!) 121/53  Pulse: 74 71 74 74  Resp: 18 14 16    Temp:        TempSrc:      SpO2: 97% 96% 96%   Weight:      Height:       General: Not in acute distress HEENT:       Eyes: PERRL, EOMI, no scleral icterus.       ENT: No discharge from the ears and nose, no pharynx injection, no tonsillar enlargement.        Neck: No JVD, no bruit, no mass felt. Heme: No neck lymph node enlargement. Cardiac: S1/S2, RRR, No murmurs, No gallops or rubs. Respiratory: Has wheezing bilaterally GI: Soft, nondistended, nontender, no rebound pain, no organomegaly, BS present. GU:  No hematuria Ext: No pitting leg edema bilaterally. 2+DP/PT pulse bilaterally. Musculoskeletal: No joint deformities, No joint redness or warmth, no limitation of ROM in spin. Skin: No rashes.  Neuro: Alert, oriented X3, cranial nerves II-XII grossly intact, moves all extremities normally.  Psych: Patient is not psychotic, no suicidal or hemocidal ideation.  Labs on Admission: I have personally reviewed following labs and imaging studies  CBC: Recent Labs  Lab 05/23/19 0532  WBC 10.7*  NEUTROABS 4.8  HGB 13.5  HCT 42.7  MCV 95.1  PLT 448   Basic Metabolic Panel: Recent Labs  Lab 05/23/19 0532  NA 139  K 5.1  CL 102  CO2 29  GLUCOSE 202*  BUN 18  CREATININE 0.91  CALCIUM 9.7   GFR: Estimated Creatinine Clearance: 77 mL/min (by C-G formula based on SCr of 0.91 mg/dL). Liver Function Tests: Recent Labs  Lab 05/23/19 0532  AST 89*  ALT 124*  ALKPHOS 70  BILITOT 0.7  PROT 6.8  ALBUMIN 4.0   No results for input(s): LIPASE, AMYLASE in the last 168 hours. No results for input(s): AMMONIA in the last 168 hours. Coagulation Profile: No results for input(s): INR, PROTIME in the last 168 hours. Cardiac Enzymes: No results for input(s): CKTOTAL, CKMB, CKMBINDEX, TROPONINI in the last 168 hours. BNP (last 3 results) No results for input(s): PROBNP in the last 8760 hours. HbA1C: No results for input(s): HGBA1C in the last 72 hours. CBG: No results for input(s): GLUCAP  in the last 168 hours. Lipid Profile: No results for input(s): CHOL, HDL, LDLCALC, TRIG, CHOLHDL, LDLDIRECT in the last 72 hours. Thyroid Function Tests: No results for input(s): TSH, T4TOTAL, FREET4, T3FREE, THYROIDAB in the last 72 hours. Anemia Panel: No results for input(s): VITAMINB12, FOLATE, FERRITIN, TIBC, IRON, RETICCTPCT in the last 72 hours. Urine analysis:    Component Value Date/Time   COLORURINE YELLOW 09/18/2018 1038   APPEARANCEUR Sl Cloudy (A) 09/18/2018 1038   APPEARANCEUR Cloudy (A) 01/01/2017 1403   LABSPEC 1.020 09/18/2018 1038   PHURINE 5.5 09/18/2018 1038   GLUCOSEU NEGATIVE 09/18/2018 1038   HGBUR NEGATIVE 09/18/2018 1038   HGBUR trace-lysed 12/20/2009 1107   BILIRUBINUR NEGATIVE 09/18/2018 1038   BILIRUBINUR 1+ 08/19/2017 1403   BILIRUBINUR Negative 01/01/2017 1403   KETONESUR NEGATIVE 09/18/2018 1038   PROTEINUR Positive (A) 08/19/2017 1403   PROTEINUR 1+ (A) 01/01/2017 1403   PROTEINUR NEGATIVE 07/12/2010 0738   UROBILINOGEN 0.2 09/18/2018 1038   NITRITE NEGATIVE 09/18/2018 1038   LEUKOCYTESUR LARGE (A) 09/18/2018 1038   Sepsis Labs: @LABRCNTIP (procalcitonin:4,lacticidven:4) )No results found for this or any previous visit (from the past 240 hour(s)).   Radiological Exams on Admission: DG Chest 2 View  Result Date: 05/23/2019 CLINICAL DATA:  Patient reports shortness of breath and congestion since Friday.short of breath EXAM: CHEST - 2 VIEW COMPARISON:  Radiograph 04/23/2017 FINDINGS: Normal mediastinum and cardiac silhouette. Normal pulmonary vasculature. No evidence of effusion, infiltrate, or pneumothorax. bibasilar atelectasis. Lungs hyperinflated. No acute bony abnormality. IMPRESSION: No active cardiopulmonary disease. Hyperinflated lungs and mild bibasilar atelectasis Electronically Signed   By: Suzy Bouchard M.D.   On: 05/23/2019 06:37     EKG: Independently reviewed.  Sinus rhythm, QTC 477, LAD, nonspecific T wave  change  Assessment/Plan Principal Problem:   Acute on chronic respiratory failure with hypoxia (HCC) Active Problems:   Hypothyroidism   Depression with anxiety   Essential hypertension, benign   OSA (obstructive sleep apnea)   Hyperlipidemia with target LDL less than 100  Paroxysmal atrial fibrillation (HCC)   Pulmonary fibrosis (HCC)   Diabetes mellitus without complication (HCC)   Asthma exacerbation   Abnormal LFTs   Acute respiratory failure with hypoxia (HCC)   Acute on chronic respiratory failure with hypoxia: Likely due to asthma exacerbation given wheezing on auscultation bilaterally.  Patient has history of pulmonary fibrosis which may have contributed partially.  BNP 73.  Chest x-ray negative. Pt is Eliquis for atrial fibrillation, low suspicious for PE patient has fever and mild leukocytosis, also has productive cough, will start antibiotics empirically  - will admit to progressive unit as inpatient - Bronchodilators - Solu-Medrol 60 mg IV tid - Rocephin plus azithromycin by IV - 2 g of magnesium sulfate were given in ED - Mucinex forcough  - Incentive spirometry - Follow up blood culture x2, sputum culture - Nasal cannula oxygen as needed to maintain O2 saturation 93% or greater  Pulmonary fibrosis: -See above for bronchodilators  Hypothyroidism: -Synthroid  Depression and anxiety: Stable, no suicidal or homicidal ideations. -Continue home medications  HTN:  -Continue home medications: Metoprolol, spironolactone -hydralazine prn  OSA (obstructive sleep apnea) -CPAP  Hyperlipidemia with target LDL less than 100 -lipitor  Paroxysmal atrial fibrillation (HCC) -Continue Eliquis and metoprolol, amiodarone  Diabetes mellitus without complication ([place]): Most recent A1c 7.1, poorly controled. Patient is taking Metformin at home -SSI  Abnormal LFTs: ALP 70, AST 89, ALT 124, total bilirubin 0.7. -Avoid using Tylenol -Check HIV antibody, hepatitis  panel    Inpatient status:  # Patient requires inpatient status due to high intensity of service, high risk for further deterioration and high frequency of surveillance required.  I certify that at the point of admission it is my clinical judgment that the patient will require inpatient hospital care spanning beyond 2 midnights from the point of admission.  . This patient has multiple chronic comorbidities including  hypertension, hyperlipidemia, diabetes mellitus, asthma, pulmonary fibrosis, GERD, hypothyroidism, depression, anxiety, OSA on CPAP, IBS, small bowel obstruction, gastroschisis, atrial fibrillation on Eliquis . Now patient has presenting with acute on chronic respiratory failure with hypoxia due to asthma exacerbation in the setting of pulmonary fibrosis . The worrisome physical exam findings include wheezing bilaterally on auscultation  . The initial radiographic and laboratory data are worrisome because of leukocytosis, abnormal liver function . Current medical needs: please see my assessment and plan . Predictability of an adverse outcome (risk): Patient has multiple comorbidities as listed above. Now presents with acute on chronic respiratory failure with hypoxia due to asthma exacerbation in the setting of pulmonary fibrosis. Patient's presentation is highly complicated.  Patient is at high risk of deteriorating.  Will need to be treated in hospital for at least 2 days.      DVT ppx: Eliquis Code Status: Full code Family Communication: not done, no family member is at bed side.     Disposition Plan:  Anticipate discharge back to previous home environment Consults called: None Admission status: Progressive unit as inpatient  Date of Service 05/23/2019    [facility]   If 7PM-7AM, please contact night-coverage [email or web address] 05/23/2019, 12:06 PM

## 2019-05-23 NOTE — ED Notes (Signed)
Ice pack applied to pt's neck for comfort. Pt verbalizes greater comfort.

## 2019-05-23 NOTE — ED Notes (Signed)
Pt provided lunch tray.

## 2019-05-23 NOTE — Evaluation (Signed)
Occupational Therapy Evaluation Patient Details [name] MRN: [id number] DOB: Jan 21, 1956 Today's Date: 05/23/2019    History of Present Illness Kirsten [name] is a 64 y.o. female here for evaluation of having cough congestion wheezing and fevers since about Wednesday. Has a history of possible pulmonary fibrosis   Clinical Impression   [name] was seen for OT evaluation this date. Prior to hospital admission, pt was independent c I/ADLs including driving. Pt lives c husband and has family closeby. Pt presents to acute OT demonstrating impaired ADL performance and functional mobility 2/2 functional endurance/strength deficits. Pt currently requires SBA for toileting at standard commode and standing grooming ADLs. Pt instructed in falls prevention and energy conservation strategies. Pt would benefit from skilled OT to address noted impairments and functional limitations (see below for any additional details) in order to maximize safety and independence while minimizing falls risk and caregiver burden. Upon hospital discharge, recommend HHOT to maximize pt safety and return to functional independence during meaningful occupations of daily life.    Follow Up Recommendations  Home health OT    Equipment Recommendations  None recommended by OT    Recommendations for Other Services       Precautions / Restrictions Precautions Precautions: None Restrictions Weight Bearing Restrictions: No      Mobility Bed Mobility Overal bed mobility: Needs Assistance Bed Mobility: Sit to Supine;Supine to Sit     Supine to sit: Supervision;HOB elevated Sit to supine: Supervision;HOB elevated      Transfers Overall transfer level: Needs assistance   Transfers: Sit to/from Stand Sit to Stand: Supervision(from high surface of stretcher and low toilet )              Balance Overall balance assessment: Mild deficits observed, not formally tested                                          ADL either performed or assessed with clinical judgement   ADL Overall ADL's : Needs assistance/impaired                                       General ADL Comments: SBA toileting at commode, hand washing standing sinkside, and don B socks seated EOB - assist for lines management.      Vision Baseline Vision/History: Wears glasses Wears Glasses: At all times       Perception     Praxis      Pertinent Vitals/Pain Pain Assessment: No/denies pain     Hand Dominance Right   Extremity/Trunk Assessment Upper Extremity Assessment Upper Extremity Assessment: Overall WFL for tasks assessed   Lower Extremity Assessment Lower Extremity Assessment: Overall WFL for tasks assessed       Communication Communication Communication: No difficulties   Cognition Arousal/Alertness: Awake/alert Behavior During Therapy: WFL for tasks assessed/performed Overall Cognitive Status: Within Functional Limits for tasks assessed                                     General Comments  SpO2 95% t/o on 2L [place]. Pt reported lightheadedness c previous toilet t/f so orthostatics taken - Reclined in bed: BP 127/69, MAP 87, HR 74. Standing: BP 112/60    Exercises  Exercises: Other exercises Other Exercises Other Exercises: Pt educated re: falls prevention, energy conservation strategies, DME recommendations Other Exercises: toileting, hand washing standing sink side, don B socks, sitting/standing balance/tolerance   Shoulder Instructions      Home Living Family/patient expects to be discharged to:: Private residence Living Arrangements: Spouse/significant other Available Help at Discharge: Family;Available 24 hours/day Type of Home: Other(Comment)(Condominium - pt reports moving next month to house ) Home Access: Level entry     Home Layout: One level     Bathroom Shower/Tub: Occupational psychologist: Handicapped  height Bathroom Accessibility: Yes How Accessible: Accessible via walker Home Equipment: Shower seat;Grab bars - tub/shower   Additional Comments: Pt uses cpap at night  but no O2 at baseline       Prior Functioning/Environment Level of Independence: Independent        Comments: Pt reports Independent for I/ADLs at baseline including driving        OT Problem List: Decreased strength;Decreased activity tolerance;Decreased knowledge of use of DME or AE      OT Treatment/Interventions: Self-care/ADL training;Therapeutic exercise;Energy conservation;DME and/or AE instruction;Patient/family education;Balance training    OT Goals(Current goals can be found in the care plan section) Acute Rehab OT Goals Patient Stated Goal: to return home OT Goal Formulation: With patient Time For Goal Achievement: 06/06/19 Potential to Achieve Goals: Good ADL Goals Pt Will Perform Lower Body Dressing: with modified independence;sit to/from stand(c LRAD PRN) Pt Will Transfer to Toilet: ambulating;regular height toilet;Independently(c LRAD PRN) Additional ADL Goal #1: Pt will independently verbalize plan to implement x3 energy conservation techniques  OT Frequency: Min 1X/week   Barriers to D/C: Inaccessible home environment          Co-evaluation              AM-PAC OT "6 Clicks" Daily Activity     Outcome Measure Help from another person eating meals?: None Help from another person taking care of personal grooming?: None Help from another person toileting, which includes using toliet, bedpan, or urinal?: A Little Help from another person bathing (including washing, rinsing, drying)?: A Little Help from another person to put on and taking off regular upper body clothing?: None Help from another person to put on and taking off regular lower body clothing?: A Little 6 Click Score: 21   End of Session Equipment Utilized During Treatment: Oxygen(2L [place])  Activity Tolerance: Patient  tolerated treatment well Patient left: in bed;with call bell/phone within reach  OT Visit Diagnosis: Unsteadiness on feet (R26.81);Other abnormalities of gait and mobility (R26.89)                Time: 2060-1561 OT Time Calculation (min): 20 min Charges:  OT General Charges $OT Visit: 1 Visit OT Evaluation $OT Eval Low Complexity: 1 Low OT Treatments $Self Care/Home Management : 8-22 mins  Dessie Coma, M.S. OTR/L  05/23/19, 4:45 PM

## 2019-05-23 NOTE — ED Notes (Signed)
Pt requesting tylenol, MD Niu notified.

## 2019-05-23 NOTE — ED Provider Notes (Signed)
[facility] Emergency Department Provider Note   ____________________________________________   First MD Initiated Contact with Patient 05/23/19 [phone]     (approximate)  I have reviewed the triage vital signs and the nursing notes.   HISTORY  Chief Complaint Shortness of Breath and Nasal Congestion    HPI Kirsten [name] is a 64 y.o. female here for evaluation of having cough congestion wheezing and fevers since about Wednesday  No nausea vomiting.  No abdominal pain.  No chest pain.  Remains compliant with all of her medications including amiodarone and Eliquis  Has a history of possible pulmonary fibrosis  She been feeling a little short of breath throughout the last day.  Does not use oxygen at home.  She reports some relief with oxygen use here.  Does have inhalers at home she is prescribed for use of her pulmonary fibrosis and asthma but has not used them  She been previously treated with prednisone and reports Levaquin which she had similar symptoms     Past Medical History:  Diagnosis Date  . A-fib ([place])   . Anxiety disorder   . Asthma   . Dyspnea   . Gastroschisis    umphalocele, rotated organs abd repair until age 64  . GERD (gastroesophageal reflux disease)   . HTN (hypertension)   . Hyperlipidemia   . IBS (irritable bowel syndrome)   . Morbid obesity ([place])    Target wt - 185  for BMI < 30  . Obesity   . OSA on CPAP   . Pulmonary fibrosis ([place])   . SBO (small bowel obstruction) ([place])    Resolved with NG/Bowel rest around 2009  . Sleep apnea   . Type II or unspecified type diabetes mellitus without mention of complication, not stated as uncontrolled     Patient Active Problem List   Diagnosis Date Noted  . Diabetes mellitus without complication ([place]) 28/78/6767  . Acute on chronic respiratory failure with hypoxia ([place]) 05/23/2019  . Asthma exacerbation 05/23/2019  . Abnormal LFTs 05/23/2019  . Wheezing 02/11/2019  .  Colon cancer screening 12/25/2017  . Estrogen deficiency 12/25/2017  . Pulmonary fibrosis ([place])   . 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 Insomnia w/ sleep apnea 03/21/2017  . Class 1 obesity with serious comorbidity and body mass index (BMI) of 34.0 to 34.9 in adult 01/31/2016  . Vaginal atrophy 01/13/2015  . Insomnia due to anxiety and fear 10/11/2014  . GAD (generalized anxiety disorder) 10/11/2014  . Paroxysmal atrial fibrillation ([place]) 04/08/2014  . Hyperlipidemia with target LDL less than 100 10/22/2012  . Asthma, mild persistent 04/06/2011  . Hypertriglyceridemia 01/29/2011  . Diuretic-induced hypokalemia 11/21/2009  . Vitamin B 12 deficiency 06/28/2009  . Hypothyroidism 06/20/2009  . Depression with anxiety 06/20/2009  . Type II diabetes mellitus with manifestations ([place]) 04/08/2009  . Essential hypertension, benign 04/08/2009  . OSA (obstructive sleep apnea) 04/08/2009    Past Surgical History:  Procedure Laterality Date  . BREAST EXCISIONAL BIOPSY Left 03/19/2012   neg  . CESAREAN SECTION    . CHOLECYSTECTOMY  1992  . Newborn Surgery - GI - ORGANS OUTSIDE ABDOMEN    . Small Bowel Repair    . TUBAL LIGATION  1988    Prior to Admission medications   Medication Sig Start Date End Date Taking? Authorizing Provider  albuterol (VENTOLIN HFA) 108 (90 Base) MCG/ACT inhaler Inhale 2 puffs into the lungs every 6 (six) hours as needed for  wheezing or shortness of breath. 02/11/19   Biagio Borg, MD  amiodarone (PACERONE) 200 MG tablet Take 200 mg daily by mouth.    [provider]  apixaban (ELIQUIS) 5 MG TABS tablet Take 1 tablet (5 mg total) by mouth 2 (two) times daily. 01/15/19   [name], MD  atorvastatin (LIPITOR) 40 MG tablet TAKE 1 TABLET(40 MG) BY MOUTH DAILY 12/29/18   [name], MD  glucose blood (ONE TOUCH ULTRA TEST) test strip Use as instructed 06/03/13   Biagio Borg, MD  icosapent Ethyl (VASCEPA) 1 g capsule Take 2 capsules (2  g total) by mouth 2 (two) times daily. 01/19/19   [name], MD  levothyroxine (SYNTHROID) 100 MCG tablet TAKE 1 TABLET(100 MCG) BY MOUTH DAILY 01/15/19   [name], MD  metFORMIN (GLUCOPHAGE) 1000 MG tablet TAKE 1 TABLET(1000 MG) BY MOUTH TWICE DAILY WITH A MEAL. 01/15/19   [name], MD  metoprolol succinate (TOPROL-XL) 50 MG 24 hr tablet Take 50 mg by mouth daily.  12/12/15   [provider]  PARoxetine (PAXIL CR) 25 MG 24 hr tablet Take 1 tablet (25 mg total) by mouth daily. 09/22/18   Philip Aspen, CNM  spironolactone (ALDACTONE) 25 MG tablet Take 1 tablet (25 mg total) by mouth daily. 02/03/19   [name], MD  zolpidem (AMBIEN) 10 MG tablet Take 1 tablet (10 mg total) by mouth at bedtime as needed. 03/26/19   [name], MD    Allergies Factive [gemifloxacin mesylate], Crestor [rosuvastatin], Sulfonamide derivatives, Gemifloxacin, and Penicillins  Family History  Problem Relation Age of Onset  . Lymphoma Mother   . Diabetes type II Father   . Colon cancer Father   . Diabetes type I Son   . Diabetes Maternal Grandmother   . Goiter Neg Hx   . Breast cancer Neg Hx   . Ovarian cancer Neg Hx     Social History Social History   Tobacco Use  . Smoking status: Never Smoker  . Smokeless tobacco: Never Used  . Tobacco comment: "tried as a teen"  Substance Use Topics  . Alcohol use: Yes    Alcohol/week: 2.0 standard drinks    Types: 2 Glasses of wine per week    Comment: rare  . Drug use: No    Review of Systems Constitutional: Low-grade fevers chills Eyes: No visual changes. ENT: No sore throat.  Slight runny nose. Cardiovascular: Denies chest pain. Respiratory: Mild shortness of breath.  Some wheezing.  Sinus congestion. Gastrointestinal: No abdominal pain.   Genitourinary: Negative for dysuria. Musculoskeletal: Negative for back pain. Skin: Negative for rash. Neurological: Negative for headaches, areas of focal weakness or  numbness.  No recent travel or surgery.  Denies leg swelling.  ____________________________________________   PHYSICAL EXAM:  VITAL SIGNS: ED Triage Vitals  Enc Vitals Group     BP 05/23/19 0526 (!) 144/105     Pulse Rate 05/23/19 0526 84     Resp 05/23/19 0526 (!) 21     Temp 05/23/19 0526 100 F (37.8 C)     Temp Source 05/23/19 0526 Oral     SpO2 05/23/19 0526 90 %     Weight 05/23/19 0524 210 lb (95.3 kg)     Height 05/23/19 0524 5' 7"  (1.702 m)     Head Circumference --      Peak Flow --      Pain Score 05/23/19 0524 0  Pain Loc --      Pain Edu? --      Excl. in [place]? --     Constitutional: Alert and oriented. Well appearing does have a frequent cough, she does not appear in acute distress, but appears to slightly dyspneic.  Referring to sit straight up. Eyes: Conjunctivae are normal. Head: Atraumatic. Nose: No congestion/rhinnorhea. Mouth/Throat: Mucous membranes are moist. Neck: No stridor.  Cardiovascular: Normal rate, regular rhythm. Grossly normal heart sounds.  Good peripheral circulation. Respiratory: Mildly increased work of breathing.  Slight accessory muscle use.  She has dry crackles in the bases bilateral.  There is associated with slight end expiratory wheezing as well.  She speaks in phrases.  Off oxygen, her lowest oxygen saturation while I am speaking her dropped to about 89%.  But quickly returns to the mid 90s when placed on 2 L. Gastrointestinal: Soft and nontender. No distention. Musculoskeletal: No lower extremity tenderness nor edema.  No signs of DVT. Neurologic:  Normal speech and language. No gross focal neurologic deficits are appreciated.  Skin:  Skin is warm, dry and intact. No rash noted. Psychiatric: Mood and affect are normal. Speech and behavior are normal.  ____________________________________________   LABS (all labs ordered are listed, but only abnormal results are displayed)  Labs Reviewed  CBC WITH DIFFERENTIAL/PLATELET -  Abnormal; Notable for the following components:      Result Value   WBC 10.7 (*)    Lymphs Abs 4.7 (*)    All other components within normal limits  COMPREHENSIVE METABOLIC PANEL - Abnormal; Notable for the following components:   Glucose, Bld 202 (*)    AST 89 (*)    ALT 124 (*)    All other components within normal limits  SARS CORONAVIRUS 2 (TAT 6-24 HRS)  BRAIN NATRIURETIC PEPTIDE  POC SARS CORONAVIRUS 2 AG -  ED  TROPONIN I (HIGH SENSITIVITY)   ____________________________________________  EKG  Reviewed inter by me at 545 Heart rate 80 QRs 140 QTc 480 Normal sinus rhythm, left axis deviation.  Possible LVH.  No evidence of acute ischemia. ____________________________________________  RADIOLOGY    Chest x-ray reviewed by me, possible bibasilar atelectasis.  No obvious infiltrates. ____________________________________________   PROCEDURES  Procedure(s) performed: 3-lead  .1-3 Lead EKG Interpretation Performed by: Delman Kitten, MD Authorized by: Delman Kitten, MD     Interpretation: normal     ECG rate:  77   ECG rate assessment: normal     Rhythm: sinus rhythm     Ectopy: none     Conduction: normal      Critical Care performed: Yes, see critical care note(s)  CRITICAL CARE Performed by: Delman Kitten   Total critical care time: 25 minutes  Critical care time was exclusive of separately billable procedures and treating other patients.  Critical care was necessary to treat or prevent imminent or life-threatening deterioration.  Critical care was time spent personally by me on the following activities: development of treatment plan with patient and/or surrogate as well as nursing, discussions with consultants, evaluation of patient's response to treatment, examination of patient, obtaining history from patient or surrogate, ordering and performing treatments and interventions, ordering and review of laboratory studies, ordering and review of radiographic  studies, pulse oximetry and re-evaluation of patient's condition.  Patient with respiratory symptoms, hypoxia and oxygen requirement requiring multiple nebulizer treatments as well as steroids and admission to the hospital.  ____________________________________________   INITIAL IMPRESSION / ASSESSMENT AND PLAN / ED COURSE  Pertinent  labs & imaging results that were available during my care of the patient were reviewed by me and considered in my medical decision making (see chart for details).   Patient was cough fevers, some wheezing and dry crackles.  History of questionable pulmonary fibrosis per patient.  Clinically tested negative on rapid Covid test does not necessarily exclude.  However will treat with steroids, azithromycin, and nebulizers.  Denies associated cardiac symptoms reassuring EKG.  Chest x-ray does not demonstrate acute infiltrate.  Clinical exam seems to suggest likely dry crackles and possible fibrosis as well as some wheezing.  Low risk for pulmonary embolism, already anticoagulated.  No signs or symptoms of dissection.  No evidence of pneumonia.  ----------------------------------------- 9:43 AM on 05/23/2019 ----------------------------------------- Reassessment, patient still feeling short of breath still becomes hypoxic when oxygen turned off saturation about 89% on room air and still mild accessory muscle use.  Does not appear to be weakening or fatigue now.  I have ordered additional nebulizer therapy at this time to assist with the patient continues to be symptomatic with oxygen requirement.  Will admit for further care and work-up the hospital service     ----------------------------------------- 10:16 AM on 05/23/2019 -----------------------------------------  Admission discussed with Dr. Blaine Hamper  ____________________________________________   FINAL CLINICAL IMPRESSION(S) / ED DIAGNOSES  Final diagnoses:  Moderate persistent asthma with exacerbation         Note:  This document was prepared using Dragon voice recognition software and may include unintentional dictation errors       Delman Kitten, MD 05/23/19 1018

## 2019-05-23 NOTE — ED Notes (Signed)
Pt is alert and oriented x4. NAD noted. Pt reports dyspnea on exertion. Pt provided ice water, TV remote, and bedside commode.

## 2019-05-23 NOTE — ED Notes (Signed)
Fine crackles noted bilaterally.

## 2019-05-23 NOTE — ED Notes (Signed)
Patient placed on O2 at 2l via nasal cannula.

## 2019-05-23 NOTE — ED Notes (Addendum)
Pt to room by wheelchair.  In no acute distress at this time.  Pt reports shortness of breath since Friday.  Husband at bedside.  O2 sat 95% on 2L.  Pt states she had the covid vaccine on Wednesday (3 days ago).

## 2019-05-23 NOTE — ED Notes (Signed)
Pt provided container for sputum sample and instructed on use- pt verbalizes understanding.

## 2019-05-23 NOTE — ED Notes (Signed)
Pt assisted to the bathroom by this RN. Pt tolerated well.

## 2019-05-24 ENCOUNTER — Encounter: Payer: Self-pay | Admitting: Internal Medicine

## 2019-05-24 DIAGNOSIS — J9601 Acute respiratory failure with hypoxia: Secondary | ICD-10-CM

## 2019-05-24 LAB — COMPREHENSIVE METABOLIC PANEL
ALT: 147 U/L — ABNORMAL HIGH (ref 0–44)
AST: 76 U/L — ABNORMAL HIGH (ref 15–41)
Albumin: 3.8 g/dL (ref 3.5–5.0)
Alkaline Phosphatase: 65 U/L (ref 38–126)
Anion gap: 12 (ref 5–15)
BUN: 22 mg/dL (ref 8–23)
CO2: 22 mmol/L (ref 22–32)
Calcium: 9.8 mg/dL (ref 8.9–10.3)
Chloride: 103 mmol/L (ref 98–111)
Creatinine, Ser: 0.67 mg/dL (ref 0.44–1.00)
GFR calc Af Amer: >60 mL/min (ref >60)
GFR calc non Af Amer: >60 mL/min (ref >60)
Glucose, Bld: 271 mg/dL — ABNORMAL HIGH (ref 70–99)
Potassium: 4.5 mmol/L (ref 3.5–5.1)
Sodium: 137 mmol/L (ref 135–145)
Total Bilirubin: 0.5 mg/dL (ref 0.3–1.2)
Total Protein: 6.7 g/dL (ref 6.5–8.1)

## 2019-05-24 LAB — EXPECTORATED SPUTUM ASSESSMENT W GRAM STAIN, RFLX TO RESP C

## 2019-05-24 LAB — GLUCOSE, CAPILLARY
Glucose-Capillary: 305 mg/dL — ABNORMAL HIGH (ref 70–99)
Glucose-Capillary: 222 mg/dL — ABNORMAL HIGH (ref 70–99)
Glucose-Capillary: 303 mg/dL — ABNORMAL HIGH (ref 70–99)

## 2019-05-24 LAB — BASIC METABOLIC PANEL
Anion gap: 8 (ref 5–15)
BUN: 23 mg/dL (ref 8–23)
CO2: 24 mmol/L (ref 22–32)
Calcium: 9.6 mg/dL (ref 8.9–10.3)
Chloride: 104 mmol/L (ref 98–111)
Creatinine, Ser: 0.72 mg/dL (ref 0.44–1.00)
GFR calc Af Amer: >60 mL/min (ref >60)
GFR calc non Af Amer: >60 mL/min (ref >60)
Glucose, Bld: 270 mg/dL — ABNORMAL HIGH (ref 70–99)
Potassium: 4.4 mmol/L (ref 3.5–5.1)
Sodium: 136 mmol/L (ref 135–145)

## 2019-05-24 LAB — CBC
HCT: 40.2 % (ref 36.0–46.0)
Hemoglobin: 12.9 g/dL (ref 12.0–15.0)
MCH: 29.9 pg (ref 26.0–34.0)
MCHC: 32.1 g/dL (ref 30.0–36.0)
MCV: 93.3 fL (ref 80.0–100.0)
Platelets: 274 10*3/uL (ref 150–400)
RBC: 4.31 MIL/uL (ref 3.87–5.11)
RDW: 13 % (ref 11.5–15.5)
WBC: 14.4 10*3/uL — ABNORMAL HIGH (ref 4.0–10.5)
nRBC: 0 % (ref 0.0–0.2)

## 2019-05-24 LAB — HEPATITIS PANEL, ACUTE
HCV Ab: NONREACTIVE
Hep A IgM: NONREACTIVE
Hep B C IgM: NONREACTIVE
Hepatitis B Surface Ag: NONREACTIVE

## 2019-05-24 MED ORDER — IPRATROPIUM BROMIDE 0.02 % IN SOLN: 2.5000 mL | Freq: Four times a day (QID) | RESPIRATORY_TRACT | Status: DC

## 2019-05-24 MED ORDER — IPRATROPIUM-ALBUTEROL 0.5-2.5 (3) MG/3ML IN SOLN
3.0000 mL | Freq: Four times a day (QID) | RESPIRATORY_TRACT | Status: DC
  Administered 2019-05-24: 3 mL via RESPIRATORY_TRACT
  Filled 2019-05-24: qty 3

## 2019-05-24 MED ORDER — IPRATROPIUM-ALBUTEROL 0.5-2.5 (3) MG/3ML IN SOLN
3.0000 mL | Freq: Three times a day (TID) | RESPIRATORY_TRACT | Status: DC
  Administered 2019-05-25 – 2019-05-26 (×5): 3 mL via RESPIRATORY_TRACT
  Filled 2019-05-24 (×5): qty 3

## 2019-05-24 MED ORDER — BUDESONIDE 0.25 MG/2ML IN SUSP
0.2500 mg | Freq: Two times a day (BID) | RESPIRATORY_TRACT | Status: DC
  Administered 2019-05-24 – 2019-05-26 (×4): 0.25 mg via RESPIRATORY_TRACT
  Filled 2019-05-24 (×4): qty 2

## 2019-05-24 MED ORDER — ORAL CARE MOUTH RINSE
15.0000 mL | Freq: Two times a day (BID) | OROMUCOSAL | Status: DC
  Administered 2019-05-25 – 2019-05-26 (×2): 15 mL via OROMUCOSAL

## 2019-05-24 MED ORDER — SODIUM CHLORIDE 0.9 % IV SOLN
INTRAVENOUS | Status: DC | PRN
  Administered 2019-05-24 – 2019-05-25 (×2): 250 mL via INTRAVENOUS

## 2019-05-24 MED ORDER — ALBUTEROL SULFATE (2.5 MG/3ML) 0.083% IN NEBU
2.5000 mg | INHALATION_SOLUTION | Freq: Four times a day (QID) | RESPIRATORY_TRACT | Status: DC
  Administered 2019-05-24: 2.5 mg via RESPIRATORY_TRACT
  Filled 2019-05-24: qty 3

## 2019-05-24 NOTE — Progress Notes (Signed)
PROGRESS NOTE  Kirsten [name] MBT:[id number] DOB: 24-Mar-1955 DOA: 05/23/2019 PCP: [name], MD   LOS: 1 day   Brief Narrative / Interim history: 64 year old female with HTN, HLD, DM, asthma, pulmonary fibrosis, GERD, hypothyroidism, depression, anxiety, OSA on CPAP, IBS, A. fib on Eliquis who came to the hospital with 2 to 3 days of progressive shortness of breath and increased wheezing.  She also has been complaining of a productive cough with greenish sputum.  She was febrile 101.4 at home.  She received her second Covid vaccine 4 days ago on Wednesday.    Subjective / 24h Interval events: She is still feeling subjectively short of breath this morning and tells me that she in fact is feeling a little bit worse and thinks that her wheezing has gotten a little bit worse.  No chest pain.  Assessment & Plan: Principal Problem Acute hypoxic respiratory failure due to acute asthma exacerbation /underlying pulmonary fibrosis -Has persistent wheezing this morning, subjectively she feels a little bit worse -Started on IV Solu-Medrol, continue, continue nebulizers and will schedule Pulmicort as well -Patient started on antibiotics with ceftriaxone/azithromycin, continue given productive cough -Closely monitor respiratory status and wean off oxygen as tolerated -Follows up as an outpatient for pulmonary fibrosis  Active Problems Paroxysmal atrial fibrillation -Continue Eliquis, metoprolol, amiodarone.  Given pulmonary fibrosis ?whether she can come off amiodarone, however defer that to outpatient  Hypothyroidism -Continue home Synthroid  Obstructive sleep apnea -Continue CPAP  Hyperlipidemia -Continue statin  Type 2 diabetes mellitus without complications -Most recent A1c 7.1, continue sliding scale here  Scheduled Meds: . albuterol  2.5 mg Inhalation Q6H  . amiodarone  200 mg Oral Daily  . apixaban  5 mg Oral BID  . atorvastatin  40 mg Oral q1800  . azithromycin  500 mg  Oral Daily  . budesonide (PULMICORT) nebulizer solution  0.25 mg Nebulization BID  . dextromethorphan-guaiFENesin  1 tablet Oral BID  . insulin aspart  0-5 Units Subcutaneous QHS  . insulin aspart  0-9 Units Subcutaneous TID WC  . ipratropium  2.5 mL Inhalation Q4H  . levothyroxine  100 mcg Oral Daily  . methylPREDNISolone (SOLU-MEDROL) injection  60 mg Intravenous Q12H  . metoprolol succinate  50 mg Oral Daily  . PARoxetine  25 mg Oral Daily  . spironolactone  25 mg Oral Daily   Continuous Infusions: . cefTRIAXone (ROCEPHIN)  IV Stopped (05/23/19 1304)   PRN Meds:.hydrALAZINE, ibuprofen, ondansetron (ZOFRAN) IV, oxyCODONE, zolpidem  DVT prophylaxis: On Eliquis Code Status: Full code Family Communication: Discussed with patient Patient admitted from: Home Anticipated d/c place: Home Barriers to d/c: Persistent hypoxic respiratory failure and continues to be symptomatic with wheezing or shortness of breath, requiring ongoing inpatient monitoring  Consultants:  None  Procedures:  None   Microbiology  None   Antimicrobials: Ceftriaxone / Azithromycin 4/10 >>    Objective: Vitals:   05/24/19 0245 05/24/19 0338 05/24/19 0524 05/24/19 0900  BP:  (!) 92/54 (!) 113/58 (!) 107/41  Pulse: 66 65 70 73  Resp: 20 16 16 18   Temp:   98 F (36.7 C)   TempSrc:   Oral   SpO2: 94% 95% 97% 95%  Weight:      Height:       No intake or output data in the 24 hours ending 05/24/19 1001 Filed Weights   05/23/19 0524  Weight: 95.3 kg    Examination:  Constitutional: NAD Eyes: no scleral icterus ENMT: Mucous membranes are moist.  Neck: normal, supple Respiratory: Diffuse bilateral end expiratory wheezing, slightly increased respiratory effort.  Good air movement Cardiovascular: Regular rate and rhythm, no murmurs / rubs / gallops.  Trace edema Abdomen: non distended, no tenderness. Bowel sounds positive.  Musculoskeletal: no clubbing / cyanosis.  Skin: no rashes Neurologic: CN  2-12 grossly intact. Strength 5/5 in all 4.    Data Reviewed: I have independently reviewed following labs and imaging studies   CBC: Recent Labs  Lab 05/23/19 0532 05/24/19 0638  WBC 10.7* 14.4*  NEUTROABS 4.8  --   HGB 13.5 12.9  HCT 42.7 40.2  MCV 95.1 93.3  PLT 306 732   Basic Metabolic Panel: Recent Labs  Lab 05/23/19 0532 05/24/19 0638  NA 139 136  K 5.1 4.4  CL 102 104  CO2 29 24  GLUCOSE 202* 270*  BUN 18 23  CREATININE 0.91 0.72  CALCIUM 9.7 9.6   Liver Function Tests: Recent Labs  Lab 05/23/19 0532  AST 89*  ALT 124*  ALKPHOS 70  BILITOT 0.7  PROT 6.8  ALBUMIN 4.0   Coagulation Profile: No results for input(s): INR, PROTIME in the last 168 hours. HbA1C: No results for input(s): HGBA1C in the last 72 hours. CBG: Recent Labs  Lab 05/23/19 1238 05/23/19 1653 05/23/19 2120  GLUCAP 271* 347* 195*    Recent Results (from the past 240 hour(s))  SARS CORONAVIRUS 2 (TAT 6-24 HRS) Nasopharyngeal Nasopharyngeal Swab     Status: None   Collection Time: 05/23/19  8:38 AM   Specimen: Nasopharyngeal Swab  Result Value Ref Range Status   SARS Coronavirus 2 NEGATIVE NEGATIVE Final    Comment: (NOTE) SARS-CoV-2 target nucleic acids are NOT DETECTED. The SARS-CoV-2 RNA is generally detectable in upper and lower respiratory specimens during the acute phase of infection. Negative results do not preclude SARS-CoV-2 infection, do not rule out co-infections with other pathogens, and should not be used as the sole basis for treatment or other patient management decisions. Negative results must be combined with clinical observations, patient history, and epidemiological information. The expected result is Negative. Fact Sheet for Patients: SugarRoll.be Fact Sheet for Healthcare Providers: https://www.woods-mathews.com/ This test is not yet approved or cleared by the [place] FDA and  has been authorized for  detection and/or diagnosis of SARS-CoV-2 by FDA under an Emergency Use Authorization (EUA). This EUA will remain  in effect (meaning this test can be used) for the duration of the COVID-19 declaration under Section 56 4(b)(1) of the Act, 21 U.S.C. section 360bbb-3(b)(1), unless the authorization is terminated or revoked sooner. Performed at [facility], [place] [address] [place], [place] [address]   CULTURE, BLOOD (ROUTINE X 2) w Reflex to ID Panel     Status: None (Preliminary result)   Collection Time: 05/23/19 12:29 PM   Specimen: BLOOD  Result Value Ref Range Status   Specimen Description BLOOD LEFT ANTECUBITAL  Final   Special Requests   Final    BOTTLES DRAWN AEROBIC AND ANAEROBIC Blood Culture adequate volume   Culture   Final    NO GROWTH < 24 HOURS Performed at [facility], [address] [place], [place] [address]    Report Status PENDING  Incomplete  CULTURE, BLOOD (ROUTINE X 2) w Reflex to ID Panel     Status: None (Preliminary result)   Collection Time: 05/23/19 12:29 PM   Specimen: BLOOD  Result Value Ref Range Status   Specimen Description BLOOD BLOOD RIGHT HAND  Final  Special Requests   Final    BOTTLES DRAWN AEROBIC AND ANAEROBIC Blood Culture adequate volume   Culture   Final    NO GROWTH < 24 HOURS Performed at [facility], [address] [place], [place] [address]    Report Status PENDING  Incomplete  Culture, sputum-assessment     Status: None (Preliminary result)   Collection Time: 05/23/19  4:43 PM   Specimen: Expectorated Sputum  Result Value Ref Range Status   Specimen Description EXPECTORATED SPUTUM  Final   Special Requests NONE  Final   Sputum evaluation   Final    Sputum specimen not acceptable for testing.  Please recollect.   NOTIFIED CHELSEA KNIGHT ON 05/24/19 AT 0730 QSD Performed at [facility], [address] [place], [place] [address]    Report Status PENDING  Incomplete     Radiology  Studies: No results found.  Marzetta Board, MD, PhD [facility]  Between 7 am - 7 pm I am available, please contact me via Amion or Securechat  Between 7 pm - 7 am I am not available, please contact night coverage MD/APP via Amion

## 2019-05-24 NOTE — Plan of Care (Signed)
Pt admitted today from the ED. VSS. O2 sats in the mid 90's on 2L O2 per [place].  Denies pain.

## 2019-05-25 LAB — CBC
HCT: 39.3 % (ref 36.0–46.0)
Hemoglobin: 12.4 g/dL (ref 12.0–15.0)
MCH: 29.8 pg (ref 26.0–34.0)
MCHC: 31.6 g/dL (ref 30.0–36.0)
MCV: 94.5 fL (ref 80.0–100.0)
Platelets: 289 10*3/uL (ref 150–400)
RBC: 4.16 MIL/uL (ref 3.87–5.11)
RDW: 13.2 % (ref 11.5–15.5)
WBC: 16.4 10*3/uL — ABNORMAL HIGH (ref 4.0–10.5)
nRBC: 0 % (ref 0.0–0.2)

## 2019-05-25 LAB — BASIC METABOLIC PANEL
Anion gap: 9 (ref 5–15)
BUN: 24 mg/dL — ABNORMAL HIGH (ref 8–23)
CO2: 28 mmol/L (ref 22–32)
Calcium: 9.9 mg/dL (ref 8.9–10.3)
Chloride: 103 mmol/L (ref 98–111)
Creatinine, Ser: 0.69 mg/dL (ref 0.44–1.00)
GFR calc Af Amer: >60 mL/min (ref >60)
GFR calc non Af Amer: >60 mL/min (ref >60)
Glucose, Bld: 276 mg/dL — ABNORMAL HIGH (ref 70–99)
Potassium: 5.4 mmol/L — ABNORMAL HIGH (ref 3.5–5.1)
Sodium: 140 mmol/L (ref 135–145)

## 2019-05-25 LAB — GLUCOSE, CAPILLARY
Glucose-Capillary: 277 mg/dL — ABNORMAL HIGH (ref 70–99)
Glucose-Capillary: 266 mg/dL — ABNORMAL HIGH (ref 70–99)
Glucose-Capillary: 290 mg/dL — ABNORMAL HIGH (ref 70–99)
Glucose-Capillary: 318 mg/dL — ABNORMAL HIGH (ref 70–99)

## 2019-05-25 LAB — EXPECTORATED SPUTUM ASSESSMENT W GRAM STAIN, RFLX TO RESP C

## 2019-05-25 MED ORDER — PREDNISONE 20 MG PO TABS: 40.0000 mg | ORAL_TABLET | Freq: Every day | ORAL | Status: DC

## 2019-05-25 MED ORDER — PREDNISONE 20 MG PO TABS
40.0000 mg | ORAL_TABLET | Freq: Every day | ORAL | Status: DC
  Administered 2019-05-25 – 2019-05-26 (×2): 40 mg via ORAL
  Filled 2019-05-25 (×2): qty 2

## 2019-05-25 MED ORDER — SODIUM POLYSTYRENE SULFONATE 15 GM/60ML PO SUSP
15.0000 g | Freq: Once | ORAL | Status: AC
  Administered 2019-05-25: 11:00:00 15 g via ORAL
  Filled 2019-05-25: qty 60

## 2019-05-25 MED ORDER — ZOLPIDEM TARTRATE 5 MG PO TABS
5.0000 mg | ORAL_TABLET | Freq: Every evening | ORAL | Status: DC | PRN
  Administered 2019-05-25: 22:00:00 5 mg via ORAL
  Filled 2019-05-25: qty 1

## 2019-05-25 MED ORDER — ACETAMINOPHEN 325 MG PO TABS
650.0000 mg | ORAL_TABLET | Freq: Four times a day (QID) | ORAL | Status: DC | PRN
  Administered 2019-05-25 – 2019-05-26 (×2): 650 mg via ORAL
  Filled 2019-05-25 (×2): qty 2

## 2019-05-25 NOTE — Progress Notes (Signed)
Pt refused cpap, wearing oxygen instead.

## 2019-05-25 NOTE — Plan of Care (Signed)
  Problem: Education: Goal: Knowledge of General Education information will improve Description: Including pain rating scale, medication(s)/side effects and non-pharmacologic comfort measures Outcome: Progressing   Problem: Clinical Measurements: Goal: Ability to maintain clinical measurements within normal limits will improve Outcome: Progressing Goal: Respiratory complications will improve Outcome: Progressing   Problem: Safety: Goal: Ability to remain free from injury will improve Outcome: Progressing

## 2019-05-25 NOTE — Progress Notes (Signed)
Inpatient Diabetes Program Recommendations  AACE/ADA: New Consensus Statement on Inpatient Glycemic Control   Target Ranges:  Prepandial:   less than 140 mg/dL      Peak postprandial:   less than 180 mg/dL (1-2 hours)      Critically ill patients:  140 - 180 mg/dL  Results for SHACARA, COZINE (MRN [id number]) as of 05/25/2019 10:41  Ref. Range 05/24/2019 11:32 05/24/2019 17:05 05/24/2019 21:09 05/25/2019 08:09  Glucose-Capillary Latest Ref Range: 70 - 99 mg/dL 305 (H) 222 (H) 303 (H) 277 (H)    Review of Glycemic Control  Diabetes history: DM2 Outpatient Diabetes medications: Metformin 1000 mg BID Current orders for Inpatient glycemic control: Novolog 0-9 units TID with meals, Novolog 0-5 units QHS; Prednisone 40 mg QAM (was Solumedrol 110m BID)  Inpatient Diabetes Program Recommendations:    Insulin-Meal Coverage: If steroids are continued, please consider ordering Novolog 5 units TID with meals for meal coverage if patient eats at least 50% of meals.  Thanks, MBarnie Alderman RN, MSN, CDE Diabetes Coordinator Inpatient Diabetes Program [phone](Team Pager from 8am to 5pm)

## 2019-05-25 NOTE — Progress Notes (Addendum)
PROGRESS NOTE    Kirsten [name]  TMH:[id number] DOB: 07-30-1955 DOA: 05/23/2019 PCP: [name], MD (Confirm with patient/family/NH records and if not entered, this HAS to be entered at [facility] point of entry. "No PCP" if truly none.)   Brief Narrative: (Start on day 1 of progress note - keep it brief and live) Patient is 64 year old female with history of hypertension, type 2 diabetes, asthma, pulmonary fibrosis, hypothyroidism, depression, obstructive sleep apnea, atrial fibrillation on Eliquis who was admitted to the hospital on 4/10 with worsening short of breath, wheezing, and hypoxemia. She also had high fever at time of admission. She is 4 days out after her second dose of Covid vaccine. Her chest x-ray showed hypoventilation without a focal pneumonia. Her sputum culture still pending. She was placed on Rocephin, this Flomax, and a Solu-Medrol.   Assessment & Plan: #1. Acute hypoxemic respite failure secondary to exacerbation of asthma/COPD with underlying pulmonary fibrosis. Patient condition seem to be improving. Chest x-ray did not show any evidence of pneumonia. Sputum culture still pending. We will try to wean off oxygen. Decrease dose of steroids to prednisone 40 mg daily. Continue to finish up the course of antibiotics. Dissipating wean off oxygen by tomorrow.  #2. Asthma exacerbation. As above.  #3. Hyperkalemia.  Patient has a mild hyperkalemia. This is secondary to Aldactone. We will discontinue it. Give a dose of Kayexalate. Check another potassium level tomorrow.  #4. paroxysmal atrial fibrillation. Continue Eliquis, metoprolol and amiodarone.  #5. Obstructive sleep apnea. Continue CPAP.  #6. Type 2 diabetes uncontrolled with hyperglycemia.  This is secondary to steroids. Steroid dose will be decreased today. Continue to follow glucose and continue sliding scale insulin for now.  #7. Liver function changes. Negative viral hepatitis panel, will repeat liver  function panel tomorrow.       DVT prophylaxis: On eliquis. Code Status: Full code. Family Communication: Discussed with the patient, all questions answered. Disposition Plan:  . Patient came from: Home            . Anticipated d/c place: Home . Barriers to d/c OR conditions which need to be met to effect a safe d/c: Anticipated discharge time tomorrow.   Consultants:   None   Procedures: None  Antimicrobials: Rocephin and Zithromax.  Subjective: Patient doing better today. Still has been short of breath with exertion. Still has some wheezing. On 2 L oxygen.   Objective: Vitals:   05/24/19 1525 05/24/19 2022 05/25/19 0000 05/25/19 0809  BP: 109/72  (!) 106/42 (!) 130/59  Pulse: 72  69 73  Resp: 17  16 18   Temp: (!) 97.3 F (36.3 C)  97.7 F (36.5 C) 97.9 F (36.6 C)  TempSrc: Oral  Oral Oral  SpO2: 96% 94% 96% 94%  Weight:      Height:        Intake/Output Summary (Last 24 hours) at 05/25/2019 1031 Last data filed at 05/24/2019 2138 Gross per 24 hour  Intake 667.96 ml  Output --  Net 667.96 ml   Filed Weights   05/23/19 0524  Weight: 95.3 kg    Examination:  General exam: Appears calm and comfortable  Respiratory system: Coarse breathing sounds to auscultation. Respiratory effort normal. Cardiovascular system: S1 & S2 heard, RRR. No JVD, murmurs, rubs, gallops or clicks. No pedal edema. Gastrointestinal system: Abdomen is nondistended, soft and nontender. No organomegaly or masses felt. Normal bowel sounds heard. Central nervous system: Alert and oriented. No focal neurological deficits. Extremities: Symmetric 5  x 5 power. Skin: No rashes, lesions or ulcers Psychiatry: Judgement and insight appear normal. Mood & affect appropriate.     Data Reviewed: I have personally reviewed following labs and imaging studies  CBC: Recent Labs  Lab 05/23/19 0532 05/24/19 0638 05/25/19 0358  WBC 10.7* 14.4* 16.4*  NEUTROABS 4.8  --   --   HGB 13.5 12.9  12.4  HCT 42.7 40.2 39.3  MCV 95.1 93.3 94.5  PLT 306 274 403   Basic Metabolic Panel: Recent Labs  Lab 05/23/19 0532 05/24/19 0638 05/25/19 0358  NA 139 137  136 140  K 5.1 4.5  4.4 5.4*  CL 102 103  104 103  CO2 29 22  24 28   GLUCOSE 202* 271*  270* 276*  BUN 18 22  23  24*  CREATININE 0.91 0.67  0.72 0.69  CALCIUM 9.7 9.8  9.6 9.9   GFR: Estimated Creatinine Clearance: 87.6 mL/min (by C-G formula based on SCr of 0.69 mg/dL). Liver Function Tests: Recent Labs  Lab 05/23/19 0532 05/24/19 0638  AST 89* 76*  ALT 124* 147*  ALKPHOS 70 65  BILITOT 0.7 0.5  PROT 6.8 6.7  ALBUMIN 4.0 3.8   No results for input(s): LIPASE, AMYLASE in the last 168 hours. No results for input(s): AMMONIA in the last 168 hours. Coagulation Profile: No results for input(s): INR, PROTIME in the last 168 hours. Cardiac Enzymes: No results for input(s): CKTOTAL, CKMB, CKMBINDEX, TROPONINI in the last 168 hours. BNP (last 3 results) No results for input(s): PROBNP in the last 8760 hours. HbA1C: No results for input(s): HGBA1C in the last 72 hours. CBG: Recent Labs  Lab 05/23/19 2120 05/24/19 1132 05/24/19 1705 05/24/19 2109 05/25/19 0809  GLUCAP 195* 305* 222* 303* 277*   Lipid Profile: No results for input(s): CHOL, HDL, LDLCALC, TRIG, CHOLHDL, LDLDIRECT in the last 72 hours. Thyroid Function Tests: No results for input(s): TSH, T4TOTAL, FREET4, T3FREE, THYROIDAB in the last 72 hours. Anemia Panel: No results for input(s): VITAMINB12, FOLATE, FERRITIN, TIBC, IRON, RETICCTPCT in the last 72 hours. Sepsis Labs: No results for input(s): PROCALCITON, LATICACIDVEN in the last 168 hours.  Recent Results (from the past 240 hour(s))  SARS CORONAVIRUS 2 (TAT 6-24 HRS) Nasopharyngeal Nasopharyngeal Swab     Status: None   Collection Time: 05/23/19  8:38 AM   Specimen: Nasopharyngeal Swab  Result Value Ref Range Status   SARS Coronavirus 2 NEGATIVE NEGATIVE Final    Comment:  (NOTE) SARS-CoV-2 target nucleic acids are NOT DETECTED. The SARS-CoV-2 RNA is generally detectable in upper and lower respiratory specimens during the acute phase of infection. Negative results do not preclude SARS-CoV-2 infection, do not rule out co-infections with other pathogens, and should not be used as the sole basis for treatment or other patient management decisions. Negative results must be combined with clinical observations, patient history, and epidemiological information. The expected result is Negative. Fact Sheet for Patients: SugarRoll.be Fact Sheet for Healthcare Providers: https://www.woods-mathews.com/ This test is not yet approved or cleared by the [place] FDA and  has been authorized for detection and/or diagnosis of SARS-CoV-2 by FDA under an Emergency Use Authorization (EUA). This EUA will remain  in effect (meaning this test can be used) for the duration of the COVID-19 declaration under Section 56 4(b)(1) of the Act, 21 U.S.C. section 360bbb-3(b)(1), unless the authorization is terminated or revoked sooner. Performed at [facility], [place] [address], [place], [place] [address]   CULTURE, BLOOD (ROUTINE X  2) w Reflex to ID Panel     Status: None (Preliminary result)   Collection Time: 05/23/19 12:29 PM   Specimen: BLOOD  Result Value Ref Range Status   Specimen Description BLOOD LEFT ANTECUBITAL  Final   Special Requests   Final    BOTTLES DRAWN AEROBIC AND ANAEROBIC Blood Culture adequate volume   Culture   Final    NO GROWTH 2 DAYS Performed at [facility], [address], [place], [place] [address]    Report Status PENDING  Incomplete  CULTURE, BLOOD (ROUTINE X 2) w Reflex to ID Panel     Status: None (Preliminary result)   Collection Time: 05/23/19 12:29 PM   Specimen: BLOOD  Result Value Ref Range Status   Specimen Description BLOOD BLOOD RIGHT HAND  Final   Special Requests   Final     BOTTLES DRAWN AEROBIC AND ANAEROBIC Blood Culture adequate volume   Culture   Final    NO GROWTH 2 DAYS Performed at [facility], [address] [place], [place] [address]    Report Status PENDING  Incomplete  Culture, sputum-assessment     Status: None (Preliminary result)   Collection Time: 05/23/19  4:43 PM   Specimen: Expectorated Sputum  Result Value Ref Range Status   Specimen Description EXPECTORATED SPUTUM  Final   Special Requests NONE  Final   Sputum evaluation   Final    Sputum specimen not acceptable for testing.  Please recollect.   NOTIFIED CHELSEA KNIGHT ON 05/24/19 AT 0730 QSD Performed at [facility], [address] [place], [place] [address]    Report Status PENDING  Incomplete  Expectorated sputum assessment w rflx to resp cult     Status: None   Collection Time: 05/24/19 10:09 AM  Result Value Ref Range Status   Specimen Description EXPECTORATED SPUTUM  Final   Special Requests NONE  Final   Sputum evaluation   Final    THIS SPECIMEN IS ACCEPTABLE FOR SPUTUM CULTURE Performed at [facility], [address] [place], [place] [address]    Report Status 05/24/2019 FINAL  Final  Culture, respiratory     Status: None (Preliminary result)   Collection Time: 05/24/19 10:09 AM  Result Value Ref Range Status   Specimen Description   Final    EXPECTORATED SPUTUM Performed at [facility], [address], [place], [place] [address]    Special Requests   Final    NONE Reflexed from [id number] Performed at [facility], [address] [place], [place] [address]    Gram Stain   Final    RARE WBC PRESENT,BOTH PMN AND MONONUCLEAR RARE GRAM POSITIVE COCCI IN PAIRS RARE GRAM VARIABLE ROD Performed at [facility], [place] [address] [place],  [address]    Culture PENDING  Incomplete   Report Status PENDING  Incomplete         Radiology Studies: No results found.      Scheduled Meds: .  amiodarone  200 mg Oral Daily  . apixaban  5 mg Oral BID  . atorvastatin  40 mg Oral q1800  . azithromycin  500 mg Oral Daily  . budesonide (PULMICORT) nebulizer solution  0.25 mg Nebulization BID  . dextromethorphan-guaiFENesin  1 tablet Oral BID  . insulin aspart  0-5 Units Subcutaneous QHS  . insulin aspart  0-9 Units Subcutaneous TID WC  . ipratropium-albuterol  3 mL Nebulization TID  . levothyroxine  100 mcg Oral  Daily  . mouth rinse  15 mL Mouth Rinse BID  . metoprolol succinate  50 mg Oral Daily  . PARoxetine  25 mg Oral Daily  . [START ON 05/26/2019] predniSONE  40 mg Oral Q breakfast  . sodium polystyrene  15 g Oral Once   Continuous Infusions: . sodium chloride 250 mL (05/25/19 0915)  . cefTRIAXone (ROCEPHIN)  IV 1 g (05/25/19 0917)     LOS: 2 days    Time spent: 35 minutes    Sharen Hones, MD [facility]   To contact the attending provider between 7A-7P or the covering provider during after hours 7P-7A, please log into the web site [email or web address] and access using universal  password for that web site. If you do not have the password, please call the hospital operator.  05/25/2019, 10:31 AM

## 2019-05-26 ENCOUNTER — Ambulatory Visit: Payer: PPO | Admitting: Internal Medicine

## 2019-05-26 LAB — HEPATIC FUNCTION PANEL
ALT: 120 U/L — ABNORMAL HIGH (ref 0–44)
AST: 41 U/L (ref 15–41)
Albumin: 3.8 g/dL (ref 3.5–5.0)
Alkaline Phosphatase: 65 U/L (ref 38–126)
Bilirubin, Direct: 0.1 mg/dL (ref 0.0–0.2)
Indirect Bilirubin: 0.5 mg/dL (ref 0.3–0.9)
Total Bilirubin: 0.6 mg/dL (ref 0.3–1.2)
Total Protein: 6.6 g/dL (ref 6.5–8.1)

## 2019-05-26 LAB — GLUCOSE, CAPILLARY
Glucose-Capillary: 243 mg/dL — ABNORMAL HIGH (ref 70–99)
Glucose-Capillary: 321 mg/dL — ABNORMAL HIGH (ref 70–99)
Glucose-Capillary: 257 mg/dL — ABNORMAL HIGH (ref 70–99)

## 2019-05-26 LAB — CBC
HCT: 39.5 % (ref 36.0–46.0)
Hemoglobin: 13 g/dL (ref 12.0–15.0)
MCH: 30 pg (ref 26.0–34.0)
MCHC: 32.9 g/dL (ref 30.0–36.0)
MCV: 91.2 fL (ref 80.0–100.0)
Platelets: 286 10*3/uL (ref 150–400)
RBC: 4.33 MIL/uL (ref 3.87–5.11)
RDW: 13.2 % (ref 11.5–15.5)
WBC: 13 10*3/uL — ABNORMAL HIGH (ref 4.0–10.5)
nRBC: 0 % (ref 0.0–0.2)

## 2019-05-26 LAB — CULTURE, RESPIRATORY W GRAM STAIN: Culture: NORMAL

## 2019-05-26 LAB — BASIC METABOLIC PANEL
Anion gap: 9 (ref 5–15)
BUN: 24 mg/dL — ABNORMAL HIGH (ref 8–23)
CO2: 29 mmol/L (ref 22–32)
Calcium: 9.9 mg/dL (ref 8.9–10.3)
Chloride: 104 mmol/L (ref 98–111)
Creatinine, Ser: 0.63 mg/dL (ref 0.44–1.00)
GFR calc Af Amer: >60 mL/min (ref >60)
GFR calc non Af Amer: >60 mL/min (ref >60)
Glucose, Bld: 279 mg/dL — ABNORMAL HIGH (ref 70–99)
Potassium: 4.4 mmol/L (ref 3.5–5.1)
Sodium: 142 mmol/L (ref 135–145)

## 2019-05-26 MED ORDER — PREDNISONE 10 MG PO TABS: ORAL_TABLET | ORAL | 0 refills | Status: DC

## 2019-05-26 MED ORDER — MUCINEX 600 MG PO TB12: 600.0000 mg | ORAL_TABLET | Freq: Two times a day (BID) | ORAL | 0 refills | Status: AC

## 2019-05-26 MED ORDER — CEFDINIR 300 MG PO CAPS: 300.0000 mg | ORAL_CAPSULE | Freq: Two times a day (BID) | ORAL | 0 refills | Status: AC

## 2019-05-26 MED ORDER — FLUTICASONE-SALMETEROL 250-50 MCG/DOSE IN AEPB: 1.0000 | INHALATION_SPRAY | Freq: Two times a day (BID) | RESPIRATORY_TRACT | 11 refills | Status: DC

## 2019-05-26 NOTE — Discharge Summary (Signed)
Physician Discharge Summary  Patient ID: Kirsten [name] MRN: [id number] DOB/AGE: [birth year] 64 y.o.  Admit date: 05/23/2019 Discharge date: 05/26/2019  Admission Diagnoses: Principal Problem:   Acute on chronic respiratory failure with hypoxia (HCC) Active Problems:   Hypothyroidism   Depression with anxiety   Essential hypertension, benign   OSA (obstructive sleep apnea)   Hyperlipidemia with target LDL less than 100   Paroxysmal atrial fibrillation (HCC)   Pulmonary fibrosis (HCC)   Diabetes mellitus without complication (HCC)   Asthma exacerbation   Abnormal LFTs   Acute respiratory failure with hypoxia ([place]) Discharge Diagnoses:  Principal Problem:   Acute on chronic respiratory failure with hypoxia (HCC) Active Problems:   Hypothyroidism   Depression with anxiety   Essential hypertension, benign   OSA (obstructive sleep apnea)   Hyperlipidemia with target LDL less than 100   Paroxysmal atrial fibrillation (HCC)   Pulmonary fibrosis (HCC)   Diabetes mellitus without complication (HCC)   Asthma exacerbation   Abnormal LFTs   Acute respiratory failure with hypoxia (HCC)   Acute respiratory failure (HCC) Hyperkalemia secondary to Aldactone.  Discharged Condition: Improved  Hospital Course:  Patient is 64 year old female with history of hypertension, type 2 diabetes, asthma, pulmonary fibrosis, hypothyroidism, depression, obstructive sleep apnea, atrial fibrillation on Eliquis who was admitted to the hospital on 4/10 with worsening short of breath, wheezing, and hypoxemia. She also had high fever at time of admission. She is 4 days out after her second dose of Covid vaccine. Her chest x-ray showed hypoventilation without a focal pneumonia. Her sputum culture still pending. She was placed on Rocephin, this Flomax, and a Solu-Medrol. Patient condition so far had improved.  Currently patient is off oxygen, not segment short of breath with exertion.  She is medically stable to  be discharged.  Patient instructions: Continue steroid taper and antibiotics. Hold off Aldactone and he was seen by family doctor, patient developed mild hyperkalemia.   Consults: None  Significant Diagnostic Studies: None  Treatments: Antibiotics and steroids  Discharge Exam: Blood pressure 132/65, pulse 61, temperature (!) 96.6 F (35.9 C), temperature source Axillary, resp. rate 16, height 5' 7"  (1.702 m), weight 95.3 kg, SpO2 99 %. General appearance: Comfortable, alert and oriented to time place and person. Resp: Coarse breathing sounds without crackles or wheezes. Cardio: Regular no murmurs. GI: No tenderness or distention, no hepatomegaly splenomegaly. Extremities: No leg edema.  Disposition: Discharge disposition: 01-Home or Self Care       Discharge Instructions    Diet - low sodium heart healthy   Complete by: As directed    Increase activity slowly   Complete by: As directed      Allergies as of 05/26/2019      Reactions   Factive [gemifloxacin Mesylate] Rash   Crestor [rosuvastatin]    GI upset   Sulfonamide Derivatives    REACTION: rash   Gemifloxacin Rash   Penicillins Hives, Rash   Has patient had a PCN reaction causing immediate rash, facial/tongue/throat swelling, SOB or lightheadedness with hypotension: No Has patient had a PCN reaction causing severe rash involving mucus membranes or skin necrosis: No Has patient had a PCN reaction that required hospitalization No Has patient had a PCN reaction occurring within the last 10 years: No If all of the above answers are "NO", then may proceed with Cephalosporin use.      Medication List    STOP taking these medications   spironolactone 25 MG tablet Commonly known as: ALDACTONE  TAKE these medications   albuterol 108 (90 Base) MCG/ACT inhaler Commonly known as: VENTOLIN HFA Inhale 2 puffs into the lungs every 6 (six) hours as needed for wheezing or shortness of breath.   amiodarone 200 MG  tablet Commonly known as: PACERONE Take 200 mg daily by mouth.   apixaban 5 MG Tabs tablet Commonly known as: Eliquis Take 1 tablet (5 mg total) by mouth 2 (two) times daily.   atorvastatin 40 MG tablet Commonly known as: LIPITOR TAKE 1 TABLET(40 MG) BY MOUTH DAILY   cefdinir 300 MG capsule Commonly known as: OMNICEF Take 1 capsule (300 mg total) by mouth 2 (two) times daily for 4 days.   Fluticasone-Salmeterol 250-50 MCG/DOSE Aepb Commonly known as: Advair Diskus Inhale 1 puff into the lungs 2 (two) times daily.   levothyroxine 100 MCG tablet Commonly known as: SYNTHROID TAKE 1 TABLET(100 MCG) BY MOUTH DAILY   metFORMIN 1000 MG tablet Commonly known as: GLUCOPHAGE TAKE 1 TABLET(1000 MG) BY MOUTH TWICE DAILY WITH A MEAL.   metoprolol succinate 50 MG 24 hr tablet Commonly known as: TOPROL-XL Take 50 mg by mouth daily.   Mucinex 600 MG 12 hr tablet Generic drug: guaiFENesin Take 1 tablet (600 mg total) by mouth 2 (two) times daily for 7 days.   PARoxetine 25 MG 24 hr tablet Commonly known as: Paxil CR Take 1 tablet (25 mg total) by mouth daily.   predniSONE 10 MG tablet Commonly known as: DELTASONE Take 4 tablets (40 mg total) by mouth daily for 2 days, THEN 2 tablets (20 mg total) daily for 3 days, THEN 1 tablet (10 mg total) daily for 3 days. Start taking on: May 26, 2019   zolpidem 10 MG tablet Commonly known as: Ambien Take 1 tablet (10 mg total) by mouth at bedtime as needed.      Follow-up Information    [name], MD Follow up in 1 week(s).   Specialty: Internal Medicine Contact information: [place] [address] [phone]          Time>30 minutes Signed: Sharen Hones 05/26/2019, 3:10 PM

## 2019-05-26 NOTE — Care Management Important Message (Signed)
Important Message  Patient Details  [name] MRN: [id number] Date of Birth: 20-Sep-1955   Medicare Important Message Given:  N/A - LOS <3 / Initial given by admissions     [name] 05/26/2019, 7:23 AM

## 2019-05-26 NOTE — Progress Notes (Signed)
Response back from md re  Hct[facility]. Pt will hold until seen by pcp. Out at this time via w/c w/o c/o.

## 2019-05-26 NOTE — Plan of Care (Signed)
  Problem: Education: Goal: Knowledge of General Education information will improve Description: Including pain rating scale, medication(s)/side effects and non-pharmacologic comfort measures Outcome: Progressing   Problem: Clinical Measurements: Goal: Ability to maintain clinical measurements within normal limits will improve Outcome: Progressing Goal: Respiratory complications will improve Outcome: Progressing   Problem: Safety: Goal: Ability to remain free from injury will improve Outcome: Progressing

## 2019-05-26 NOTE — Progress Notes (Signed)
Pt for discharge home. Instructions discussed  Re meds / diet / activity / and f/u. Verbalizes understanding. Sl d/cd per policy.  Message to  md  Re  pts hctz. Awaiting response

## 2019-05-26 NOTE — Progress Notes (Signed)
Inpatient Diabetes Program Recommendations  AACE/ADA: New Consensus Statement on Inpatient Glycemic Control   Target Ranges:  Prepandial:   less than 140 mg/dL      Peak postprandial:   less than 180 mg/dL (1-2 hours)      Critically ill patients:  140 - 180 mg/dL   Results for Kirsten [name], Kirsten [name] (MRN [id number]) as of 05/26/2019 12:37  Ref. Range 05/25/2019 08:09 05/25/2019 11:48 05/25/2019 16:46 05/25/2019 21:26 05/26/2019 07:27 05/26/2019 10:11 05/26/2019 11:49  Glucose-Capillary Latest Ref Range: 70 - 99 mg/dL 277 (H) 266 (H) 290 (H) 318 (H) 243 (H) 321 (H) 257 (H)   Review of Glycemic Control  Diabetes history: DM2 Outpatient Diabetes medications: Metformin 1000 mg BID Current orders for Inpatient glycemic control: Novolog 0-9 units TID with meals, Novolog 0-5 units QHS; Prednisone 40 mg QAM   Inpatient Diabetes Program Recommendations:    Insulin-Meal Coverage: Post prandial glucose is consistently elevated.  If steroids are continued, please consider ordering Novolog 5 units TID with meals for meal coverage if patient eats at least 50% of meals.  Thanks, Barnie Alderman, RN, MSN, CDE Diabetes Coordinator Inpatient Diabetes Program [phone] (Team Pager from 8am to 5pm)

## 2019-05-28 LAB — CULTURE, BLOOD (ROUTINE X 2)
Culture: NO GROWTH
Culture: NO GROWTH
Special Requests: ADEQUATE
Special Requests: ADEQUATE

## 2019-06-02 ENCOUNTER — Encounter: Payer: Self-pay | Admitting: Internal Medicine

## 2019-06-02 ENCOUNTER — Other Ambulatory Visit: Payer: Self-pay

## 2019-06-02 ENCOUNTER — Ambulatory Visit (INDEPENDENT_AMBULATORY_CARE_PROVIDER_SITE_OTHER): Payer: PPO | Admitting: Internal Medicine

## 2019-06-02 VITALS — BP 126/68 | HR 80 | Temp 98.4°F | Resp 16 | Ht 67.0 in | Wt 211.0 lb

## 2019-06-02 DIAGNOSIS — J9621 Acute and chronic respiratory failure with hypoxia: Secondary | ICD-10-CM

## 2019-06-02 DIAGNOSIS — J4541 Moderate persistent asthma with (acute) exacerbation: Secondary | ICD-10-CM

## 2019-06-02 DIAGNOSIS — K7581 Nonalcoholic steatohepatitis (NASH): Secondary | ICD-10-CM | POA: Insufficient documentation

## 2019-06-02 DIAGNOSIS — E118 Type 2 diabetes mellitus with unspecified complications: Secondary | ICD-10-CM | POA: Diagnosis not present

## 2019-06-02 DIAGNOSIS — E039 Hypothyroidism, unspecified: Secondary | ICD-10-CM

## 2019-06-02 DIAGNOSIS — I10 Essential (primary) hypertension: Secondary | ICD-10-CM

## 2019-06-02 LAB — CBC WITH DIFFERENTIAL/PLATELET
Basophils Absolute: 0 10*3/uL (ref 0.0–0.1)
Basophils Relative: 0.3 % (ref 0.0–3.0)
Eosinophils Absolute: 0.3 10*3/uL (ref 0.0–0.7)
Eosinophils Relative: 1.8 % (ref 0.0–5.0)
HCT: 42.1 % (ref 36.0–46.0)
Hemoglobin: 13.6 g/dL (ref 12.0–15.0)
Lymphocytes Relative: 20.1 % (ref 12.0–46.0)
Lymphs Abs: 2.7 10*3/uL (ref 0.7–4.0)
MCHC: 32.4 g/dL (ref 30.0–36.0)
MCV: 92.6 fl (ref 78.0–100.0)
Monocytes Absolute: 0.8 10*3/uL (ref 0.1–1.0)
Monocytes Relative: 6.1 % (ref 3.0–12.0)
Neutro Abs: 9.8 10*3/uL — ABNORMAL HIGH (ref 1.4–7.7)
Neutrophils Relative %: 71.7 % (ref 43.0–77.0)
Platelets: 299 10*3/uL (ref 150.0–400.0)
RBC: 4.55 Mil/uL (ref 3.87–5.11)
RDW: 13.8 % (ref 11.5–15.5)
WBC: 13.6 10*3/uL — ABNORMAL HIGH (ref 4.0–10.5)

## 2019-06-02 LAB — BASIC METABOLIC PANEL
BUN: 21 mg/dL (ref 6–23)
CO2: 28 mEq/L (ref 19–32)
Calcium: 10 mg/dL (ref 8.4–10.5)
Chloride: 99 mEq/L (ref 96–112)
Creatinine, Ser: 0.91 mg/dL (ref 0.40–1.20)
GFR: 62.67 mL/min (ref >60.00)
Glucose, Bld: 314 mg/dL — ABNORMAL HIGH (ref 70–99)
Potassium: 4.7 mEq/L (ref 3.5–5.1)
Sodium: 136 mEq/L (ref 135–145)

## 2019-06-02 LAB — HEPATIC FUNCTION PANEL
ALT: 87 U/L — ABNORMAL HIGH (ref 0–35)
AST: 40 U/L — ABNORMAL HIGH (ref 0–37)
Albumin: 4.1 g/dL (ref 3.5–5.2)
Alkaline Phosphatase: 72 U/L (ref 39–117)
Bilirubin, Direct: 0.2 mg/dL (ref 0.0–0.3)
Total Bilirubin: 0.9 mg/dL (ref 0.2–1.2)
Total Protein: 6.3 g/dL (ref 6.0–8.3)

## 2019-06-02 LAB — PROTIME-INR
INR: 1.4 ratio — ABNORMAL HIGH (ref 0.8–1.0)
Prothrombin Time: 15.5 s — ABNORMAL HIGH (ref 9.6–13.1)

## 2019-06-02 LAB — HEMOGLOBIN A1C: Hgb A1c MFr Bld: 7.8 % — ABNORMAL HIGH (ref 4.6–6.5)

## 2019-06-02 LAB — TSH: TSH: 2.13 u[IU]/mL (ref 0.35–4.50)

## 2019-06-02 MED ORDER — PIOGLITAZONE HCL 15 MG PO TABS: 15.0000 mg | ORAL_TABLET | Freq: Every day | ORAL | 1 refills | Status: DC

## 2019-06-02 NOTE — Patient Instructions (Signed)
http://www.aaaai.org/conditions-and-treatments/asthma">  Asthma, Adult  Asthma is a long-term (chronic) condition that causes recurrent episodes in which the airways become tight and narrow. The airways are the passages that lead from the nose and mouth down into the lungs. Asthma episodes, also called asthma attacks, can cause coughing, wheezing, shortness of breath, and chest pain. The airways can also fill with mucus. During an attack, it can be difficult to breathe. Asthma attacks can range from minor to life threatening. Asthma cannot be cured, but medicines and lifestyle changes can help control it and treat acute attacks. What are the causes? This condition is believed to be caused by inherited (genetic) and environmental factors, but its exact cause is not known. There are many things that can bring on an asthma attack or make asthma symptoms worse (triggers). Asthma triggers are different for each person. Common triggers include:  Mold.  Dust.  Cigarette smoke.  Cockroaches.  Things that can cause allergy symptoms (allergens), such as animal dander or pollen from trees or grass.  Air pollutants such as household cleaners, wood smoke, smog, or Advertising account planner.  Cold air, weather changes, and winds (which increase molds and pollen in the air).  Strong emotional expressions such as crying or laughing hard.  Stress.  Certain medicines (such as aspirin) or types of medicines (such as beta-blockers).  Sulfites in foods and drinks. Foods and drinks that may contain sulfites include dried fruit, potato chips, and sparkling grape juice.  Infections or inflammatory conditions such as the flu, a cold, or inflammation of the nasal membranes (rhinitis).  Gastroesophageal reflux disease (GERD).  Exercise or strenuous activity. What are the signs or symptoms? Symptoms of this condition may occur right after asthma is triggered or many hours later. Symptoms include:  Wheezing. This can  sound like whistling when you breathe.  Excessive nighttime or early morning coughing.  Frequent or severe coughing with a common cold.  Chest tightness.  Shortness of breath.  Tiredness (fatigue) with minimal activity. How is this diagnosed? This condition is diagnosed based on:  Your medical history.  A physical exam.  Tests, which may include: ? Lung function studies and pulmonary studies (spirometry). These tests can evaluate the flow of air in your lungs. ? Allergy tests. ? Imaging tests, such as X-rays. How is this treated? There is no cure for this condition, but treatment can help control your symptoms. Treatment for asthma usually involves:  Identifying and avoiding your asthma triggers.  Using medicines to control your symptoms. Generally, two types of medicines are used to treat asthma: ? Controller medicines. These help prevent asthma symptoms from occurring. They are usually taken every day. ? Fast-acting reliever or rescue medicines. These quickly relieve asthma symptoms by widening the narrow and tight airways. They are used as needed and provide short-term relief.  Using supplemental oxygen. This may be needed during a severe episode.  Using other medicines, such as: ? Allergy medicines, such as antihistamines, if your asthma attacks are triggered by allergens. ? Immune medicines (immunomodulators). These are medicines that help control the immune system.  Creating an asthma action plan. An asthma action plan is a written plan for managing and treating your asthma attacks. This plan includes: ? A list of your asthma triggers and how to avoid them. ? Information about when medicines should be taken and when their dosage should be changed. ? Instructions about using a device called a peak flow meter. A peak flow meter measures how well the lungs are working  and the severity of your asthma. It helps you monitor your condition. Follow these instructions at  home: Controlling your home environment Control your home environment in the following ways to help avoid triggers and prevent asthma attacks:  Change your heating and air conditioning filter regularly.  Limit your use of fireplaces and wood stoves.  Get rid of pests (such as roaches and mice) and their droppings.  Throw away plants if you see mold on them.  Clean floors and dust surfaces regularly. Use unscented cleaning products.  Try to have someone else vacuum for you regularly. Stay out of rooms while they are being vacuumed and for a short while afterward. If you vacuum, use a dust mask from a hardware store, a double-layered or microfilter vacuum cleaner bag, or a vacuum cleaner with a HEPA filter.  Replace carpet with wood, tile, or vinyl flooring. Carpet can trap dander and dust.  Use allergy-proof pillows, mattress covers, and box spring covers.  Keep your bedroom a trigger-free room.  Avoid pets and keep windows closed when allergens are in the air.  Wash beddings every week in hot water and dry them in a dryer.  Use blankets that are made of polyester or cotton.  Clean bathrooms and kitchens with bleach. If possible, have someone repaint the walls in these rooms with mold-resistant paint. Stay out of the rooms that are being cleaned and painted.  Wash your hands often with soap and water. If soap and water are not available, use hand sanitizer.  Do not allow anyone to smoke in your home. General instructions  Take over-the-counter and prescription medicines only as told by your health care provider. ? Speak with your health care provider if you have questions about how or when to take the medicines. ? Make note if you are requiring more frequent dosages.  Do not use any products that contain nicotine or tobacco, such as cigarettes and e-cigarettes. If you need help quitting, ask your health care provider. Also, avoid being exposed to secondhand smoke.  Use a peak  flow meter as told by your health care provider. Record and keep track of the readings.  Understand and use the asthma action plan to help minimize, or stop an asthma attack, without needing to seek medical care.  Make sure you stay up to date on your yearly vaccinations as told by your health care provider. This may include vaccines for the flu and pneumonia.  Avoid outdoor activities when allergen counts are high and when air quality is low.  Wear a ski mask that covers your nose and mouth during outdoor winter activities. Exercise indoors on cold days if you can.  Warm up before exercising, and take time for a cool-down period after exercise.  Keep all follow-up visits as told by your health care provider. This is important. Where to find more information  For information about asthma, turn to the Centers for Disease Control and Prevention at http://www.clark.net/.htm  For air quality information, turn to [employer] at WeightRating.nl Contact a health care provider if:  You have wheezing, shortness of breath, or a cough even while you are taking medicine to prevent attacks.  The mucus you cough up (sputum) is thicker than usual.  Your sputum changes from clear or white to yellow, green, gray, or bloody.  Your medicines are causing side effects, such as a rash, itching, swelling, or trouble breathing.  You need to use a reliever medicine more than 2-3 times a week.  Your peak  flow reading is still at 50-79% of your personal best after following your action plan for 1 hour.  You have a fever. Get help right away if:  You are getting worse and do not respond to treatment during an asthma attack.  You are short of breath when at rest or when doing very little physical activity.  You have difficulty eating, drinking, or talking.  You have chest pain or tightness.  You develop a fast heartbeat or palpitations.  You have a bluish color to your lips or fingernails.  You  are light-headed or dizzy, or you faint.  Your peak flow reading is less than 50% of your personal best.  You feel too tired to breathe normally. Summary  Asthma is a long-term (chronic) condition that causes recurrent episodes in which the airways become tight and narrow. These episodes can cause coughing, wheezing, shortness of breath, and chest pain.  Asthma cannot be cured, but medicines and lifestyle changes can help control it and treat acute attacks.  Make sure you understand how to avoid triggers and how and when to use your medicines.  Asthma attacks can range from minor to life threatening. Get help right away if you have an asthma attack and do not respond to treatment with your usual rescue medicines. This information is not intended to replace advice given to you by your health care provider. Make sure you discuss any questions you have with your health care provider. Document Revised: 04/03/2018 Document Reviewed: 03/05/2016 Elsevier Patient Education  2020 [employer].

## 2019-06-02 NOTE — Progress Notes (Signed)
Subjective:  Patient ID: Kirsten [name], female    DOB: 01-11-1956  Age: 64 y.o. MRN: [id number]  CC: Asthma, Hypothyroidism, and Diabetes  This visit occurred during the SARS-CoV-2 public health emergency.  Safety protocols were in place, including screening questions prior to the visit, additional usage of staff PPE, and extensive cleaning of exam room while observing appropriate contact time as indicated for disinfecting solutions.    HPI Kirsten [name] presents for f/up after a recent admission. She received a COVID-19 vaccine about 2 weeks ago and then 3 days after that she developed cough, fever, chills, and shortness of breath. She was admitted and treated for acute on chronic respiratory failure. She was given broad-spectrum antibiotics and steroids. It was felt that her illness was not related to the COVID-19 vaccine. She feels better but continues to complain of fatigue, weakness, feeling shaky, nonproductive cough, and raspy voice. She tells me the cough is improving. She denies fever, chills, shortness of breath, chest pain, or hemoptysis.  Admission Diagnoses: Principal Problem: Acute on chronic respiratory failure with hypoxia (HCC) Active Problems: Hypothyroidism Depression with anxiety Essential hypertension, benign OSA (obstructive sleep apnea) Hyperlipidemia with target LDL less than 100 Paroxysmal atrial fibrillation (HCC) Pulmonary fibrosis (HCC) Diabetes mellitus without complication (HCC) Asthma exacerbation Abnormal LFTs Acute respiratory failure with hypoxia ([place]) Discharge Diagnoses:  Principal Problem:   Acute on chronic respiratory failure with hypoxia (HCC) Active Problems:   Hypothyroidism   Depression with anxiety   Essential hypertension, benign   OSA (obstructive sleep apnea)   Hyperlipidemia with target LDL less than 100   Paroxysmal atrial fibrillation (HCC)   Pulmonary fibrosis (HCC)   Diabetes mellitus without  complication (HCC)   Asthma exacerbation   Abnormal LFTs   Acute respiratory failure with hypoxia (HCC)   Acute respiratory failure (HCC) Hyperkalemia secondary to Aldactone.  Discharged Condition: Improved  Hospital Course:  Patient is 64 year old female with history of hypertension, type 2 diabetes, asthma, pulmonary fibrosis, hypothyroidism, depression, obstructive sleep apnea, atrial fibrillation on Eliquis who was admitted to the hospitalon 4/10with worsening short of breath, wheezing, and hypoxemia. She also had high fever at time of admission. She is 4days out after her second dose of Covid vaccine.Her chest x-ray showed hypoventilation without a focal pneumonia. Her sputum culture still pending. She was placed on Rocephin, this Flomax, and a Solu-Medrol. Patient condition so far had improved.  Currently patient is off oxygen, not segment short of breath with exertion.  She is medically stable to be discharged.  Patient instructions: Continue steroid taper and antibiotics. Hold off Aldactone and he was seen by family doctor, patient developed mild hyperkalemia  Outpatient Medications Prior to Visit  Medication Sig Dispense Refill  . albuterol (VENTOLIN HFA) 108 (90 Base) MCG/ACT inhaler Inhale 2 puffs into the lungs every 6 (six) hours as needed for wheezing or shortness of breath. 18 g 2  . amiodarone (PACERONE) 200 MG tablet Take 200 mg daily by mouth.    Marland Kitchen apixaban (ELIQUIS) 5 MG TABS tablet Take 1 tablet (5 mg total) by mouth 2 (two) times daily. 180 tablet 1  . atorvastatin (LIPITOR) 40 MG tablet TAKE 1 TABLET(40 MG) BY MOUTH DAILY 90 tablet 1  . Fluticasone-Salmeterol (ADVAIR DISKUS) 250-50 MCG/DOSE AEPB Inhale 1 puff into the lungs 2 (two) times daily. 60 each 11  . guaiFENesin (MUCINEX) 600 MG 12 hr tablet Take 1 tablet (600 mg total) by mouth 2 (two) times daily for 7 days. 60 tablet  0  . levothyroxine (SYNTHROID) 100 MCG tablet TAKE 1 TABLET(100 MCG) BY MOUTH DAILY 90  tablet 1  . metFORMIN (GLUCOPHAGE) 1000 MG tablet TAKE 1 TABLET(1000 MG) BY MOUTH TWICE DAILY WITH A MEAL. 180 tablet 1  . metoprolol succinate (TOPROL-XL) 50 MG 24 hr tablet Take 50 mg by mouth daily.     Marland Kitchen PARoxetine (PAXIL CR) 25 MG 24 hr tablet Take 1 tablet (25 mg total) by mouth daily. 30 tablet 9  . zolpidem (AMBIEN) 10 MG tablet Take 1 tablet (10 mg total) by mouth at bedtime as needed. 30 tablet 5  . predniSONE (DELTASONE) 10 MG tablet Take 4 tablets (40 mg total) by mouth daily for 2 days, THEN 2 tablets (20 mg total) daily for 3 days, THEN 1 tablet (10 mg total) daily for 3 days. 30 tablet 0   No facility-administered medications prior to visit.    ROS Review of Systems  Constitutional: Positive for fatigue. Negative for appetite change, chills, diaphoresis, fever and unexpected weight change.  HENT: Positive for voice change (laryngitis). Negative for facial swelling, sinus pressure, sore throat and trouble swallowing.   Eyes: Negative.   Respiratory: Positive for cough. Negative for chest tightness, shortness of breath and wheezing.   Cardiovascular: Negative for chest pain, palpitations and leg swelling.  Gastrointestinal: Negative for abdominal pain, constipation, diarrhea, nausea and vomiting.  Genitourinary: Negative for difficulty urinating and dysuria.  Musculoskeletal: Negative.  Negative for arthralgias and myalgias.  Skin: Negative.  Negative for color change and pallor.  Neurological: Positive for weakness. Negative for dizziness, light-headedness, numbness and headaches.  Hematological: Negative for adenopathy. Does not bruise/bleed easily.  Psychiatric/Behavioral: Negative.     Objective:  BP 126/68 (BP Location: Left Arm, Patient Position: Sitting, Cuff Size: Large)   Pulse 80   Temp 98.4 F (36.9 C) (Oral)   Resp 16   Ht 5' 7"  (1.702 m)   Wt 211 lb (95.7 kg)   SpO2 96%   BMI 33.05 kg/m   BP Readings from Last 3 Encounters:  06/02/19 126/68  05/26/19  132/65  01/15/19 120/70    Wt Readings from Last 3 Encounters:  06/02/19 211 lb (95.7 kg)  05/23/19 210 lb (95.3 kg)  01/15/19 212 lb (96.2 kg)    Physical Exam Vitals reviewed.  Constitutional:      General: She is not in acute distress.    Appearance: Normal appearance. She is not ill-appearing, toxic-appearing or diaphoretic.  HENT:     Nose: Nose normal.     Mouth/Throat:     Mouth: Mucous membranes are moist.  Eyes:     General: No scleral icterus.    Conjunctiva/sclera: Conjunctivae normal.  Cardiovascular:     Rate and Rhythm: Normal rate and regular rhythm.     Heart sounds: No murmur.  Pulmonary:     Effort: Pulmonary effort is normal. No respiratory distress.     Breath sounds: No stridor. No wheezing, rhonchi or rales.  Abdominal:     General: Abdomen is protuberant. Bowel sounds are normal. There is no distension.     Palpations: Abdomen is soft. There is no hepatomegaly, splenomegaly or mass.     Tenderness: There is no abdominal tenderness.  Musculoskeletal:        General: Normal range of motion.     Cervical back: Neck supple.     Right lower leg: No edema.     Left lower leg: No edema.  Lymphadenopathy:     Cervical:  No cervical adenopathy.  Skin:    General: Skin is warm and dry.     Coloration: Skin is not pale.  Neurological:     General: No focal deficit present.     Mental Status: She is alert.  Psychiatric:        Mood and Affect: Mood normal.        Behavior: Behavior normal.     Lab Results  Component Value Date   WBC 13.6 (H) 06/02/2019   HGB 13.6 06/02/2019   HCT 42.1 06/02/2019   PLT 299.0 06/02/2019   GLUCOSE 314 (H) 06/02/2019   CHOL 128 01/15/2019   TRIG 230.0 (H) 01/15/2019   HDL 41.80 01/15/2019   LDLDIRECT 63.0 01/15/2019   LDLCALC SEE COMMENT 11/26/2013   ALT 87 (H) 06/02/2019   AST 40 (H) 06/02/2019   NA 136 06/02/2019   K 4.7 06/02/2019   CL 99 06/02/2019   CREATININE 0.91 06/02/2019   BUN 21 06/02/2019   CO2  28 06/02/2019   TSH 2.13 06/02/2019   INR 1.4 (H) 06/02/2019   HGBA1C 7.8 (H) 06/02/2019   MICROALBUR 1.2 09/18/2018    DG Chest 2 View  Result Date: 05/23/2019 CLINICAL DATA:  Patient reports shortness of breath and congestion since Friday.short of breath EXAM: CHEST - 2 VIEW COMPARISON:  Radiograph 04/23/2017 FINDINGS: Normal mediastinum and cardiac silhouette. Normal pulmonary vasculature. No evidence of effusion, infiltrate, or pneumothorax. bibasilar atelectasis. Lungs hyperinflated. No acute bony abnormality. IMPRESSION: No active cardiopulmonary disease. Hyperinflated lungs and mild bibasilar atelectasis Electronically Signed   By: Suzy Bouchard M.D.   On: 05/23/2019 06:37    Assessment & Plan:   Altair was seen today for asthma, hypothyroidism and diabetes.  Diagnoses and all orders for this visit:  Essential hypertension, benign- Her blood pressure is adequately well controlled. -     Basic metabolic panel; Future -     CBC with Differential/Platelet; Future -     CBC with Differential/Platelet -     Basic metabolic panel  Acute on chronic respiratory failure with hypoxia ([place])- Improvement noted. Her WBC count is elevated but I think this is related to the steroids and not an indication of ongoing infection. I do not think any additional antibiotics or steroids are indicated.  Hypothyroidism, unspecified type- Her TSH is in the normal range. She will remain on the current dose of levothyroxine. -     TSH; Future -     TSH  Type II diabetes mellitus with manifestations ([place])- Her blood sugar is not adequately well controlled and she has elevated liver enzymes caused by NASH. I recommended that she add pioglitazone to her current regimen. -     Basic metabolic panel; Future -     Hemoglobin A1c; Future -     Hemoglobin A1c -     Basic metabolic panel -     pioglitazone (ACTOS) 15 MG tablet; Take 1 tablet (15 mg total) by mouth daily. -     Ambulatory referral to  Ophthalmology  Moderate persistent asthma with exacerbation- She will complete the course of prednisolone and will continue using a LABA/ICS inhaler.  NASH (nonalcoholic steatohepatitis) -     Protime-INR; Future -     Hepatic function panel; Future -     Hepatic function panel -     Protime-INR -     pioglitazone (ACTOS) 15 MG tablet; Take 1 tablet (15 mg total) by mouth daily.   I have discontinued Erline Levine  A. Hopes's predniSONE. I am also having her start on pioglitazone. Additionally, I am having her maintain her metoprolol succinate, amiodarone, PARoxetine, atorvastatin, apixaban, levothyroxine, metFORMIN, albuterol, zolpidem, and Fluticasone-Salmeterol.  Meds ordered this encounter  Medications  . pioglitazone (ACTOS) 15 MG tablet    Sig: Take 1 tablet (15 mg total) by mouth daily.    Dispense:  90 tablet    Refill:  1     Follow-up: Return in about 3 months (around 09/01/2019).  [name], MD

## 2019-06-04 ENCOUNTER — Other Ambulatory Visit: Payer: Self-pay | Admitting: Internal Medicine

## 2019-06-04 DIAGNOSIS — I10 Essential (primary) hypertension: Secondary | ICD-10-CM

## 2019-06-04 MED ORDER — HYDROCHLOROTHIAZIDE 12.5 MG PO CAPS: 12.5000 mg | ORAL_CAPSULE | Freq: Every day | ORAL | 1 refills | Status: DC

## 2019-06-05 ENCOUNTER — Encounter: Payer: Self-pay | Admitting: Internal Medicine

## 2019-06-12 ENCOUNTER — Other Ambulatory Visit: Payer: Self-pay | Admitting: Internal Medicine

## 2019-06-12 DIAGNOSIS — E038 Other specified hypothyroidism: Secondary | ICD-10-CM

## 2019-06-12 DIAGNOSIS — E032 Hypothyroidism due to medicaments and other exogenous substances: Secondary | ICD-10-CM

## 2019-06-12 DIAGNOSIS — E118 Type 2 diabetes mellitus with unspecified complications: Secondary | ICD-10-CM

## 2019-06-13 ENCOUNTER — Other Ambulatory Visit: Payer: Self-pay | Admitting: Internal Medicine

## 2019-06-13 DIAGNOSIS — E785 Hyperlipidemia, unspecified: Secondary | ICD-10-CM

## 2019-06-23 DIAGNOSIS — G4733 Obstructive sleep apnea (adult) (pediatric): Secondary | ICD-10-CM | POA: Diagnosis not present

## 2019-06-29 DIAGNOSIS — M9905 Segmental and somatic dysfunction of pelvic region: Secondary | ICD-10-CM | POA: Diagnosis not present

## 2019-06-29 DIAGNOSIS — M955 Acquired deformity of pelvis: Secondary | ICD-10-CM | POA: Diagnosis not present

## 2019-06-29 DIAGNOSIS — M5136 Other intervertebral disc degeneration, lumbar region: Secondary | ICD-10-CM | POA: Diagnosis not present

## 2019-06-29 DIAGNOSIS — M9903 Segmental and somatic dysfunction of lumbar region: Secondary | ICD-10-CM | POA: Diagnosis not present

## 2019-07-01 DIAGNOSIS — M9905 Segmental and somatic dysfunction of pelvic region: Secondary | ICD-10-CM | POA: Diagnosis not present

## 2019-07-01 DIAGNOSIS — M955 Acquired deformity of pelvis: Secondary | ICD-10-CM | POA: Diagnosis not present

## 2019-07-01 DIAGNOSIS — M5136 Other intervertebral disc degeneration, lumbar region: Secondary | ICD-10-CM | POA: Diagnosis not present

## 2019-07-01 DIAGNOSIS — M9903 Segmental and somatic dysfunction of lumbar region: Secondary | ICD-10-CM | POA: Diagnosis not present

## 2019-07-06 DIAGNOSIS — M9903 Segmental and somatic dysfunction of lumbar region: Secondary | ICD-10-CM | POA: Diagnosis not present

## 2019-07-06 DIAGNOSIS — M9905 Segmental and somatic dysfunction of pelvic region: Secondary | ICD-10-CM | POA: Diagnosis not present

## 2019-07-06 DIAGNOSIS — M5136 Other intervertebral disc degeneration, lumbar region: Secondary | ICD-10-CM | POA: Diagnosis not present

## 2019-07-06 DIAGNOSIS — M955 Acquired deformity of pelvis: Secondary | ICD-10-CM | POA: Diagnosis not present

## 2019-07-08 DIAGNOSIS — M9903 Segmental and somatic dysfunction of lumbar region: Secondary | ICD-10-CM | POA: Diagnosis not present

## 2019-07-08 DIAGNOSIS — M5136 Other intervertebral disc degeneration, lumbar region: Secondary | ICD-10-CM | POA: Diagnosis not present

## 2019-07-08 DIAGNOSIS — M955 Acquired deformity of pelvis: Secondary | ICD-10-CM | POA: Diagnosis not present

## 2019-07-08 DIAGNOSIS — M9905 Segmental and somatic dysfunction of pelvic region: Secondary | ICD-10-CM | POA: Diagnosis not present

## 2019-07-14 DIAGNOSIS — M955 Acquired deformity of pelvis: Secondary | ICD-10-CM | POA: Diagnosis not present

## 2019-07-14 DIAGNOSIS — M5136 Other intervertebral disc degeneration, lumbar region: Secondary | ICD-10-CM | POA: Diagnosis not present

## 2019-07-14 DIAGNOSIS — M9905 Segmental and somatic dysfunction of pelvic region: Secondary | ICD-10-CM | POA: Diagnosis not present

## 2019-07-14 DIAGNOSIS — M9903 Segmental and somatic dysfunction of lumbar region: Secondary | ICD-10-CM | POA: Diagnosis not present

## 2019-07-15 DIAGNOSIS — M5136 Other intervertebral disc degeneration, lumbar region: Secondary | ICD-10-CM | POA: Diagnosis not present

## 2019-07-15 DIAGNOSIS — M9905 Segmental and somatic dysfunction of pelvic region: Secondary | ICD-10-CM | POA: Diagnosis not present

## 2019-07-15 DIAGNOSIS — M955 Acquired deformity of pelvis: Secondary | ICD-10-CM | POA: Diagnosis not present

## 2019-07-15 DIAGNOSIS — M9903 Segmental and somatic dysfunction of lumbar region: Secondary | ICD-10-CM | POA: Diagnosis not present

## 2019-07-16 DIAGNOSIS — M9903 Segmental and somatic dysfunction of lumbar region: Secondary | ICD-10-CM | POA: Diagnosis not present

## 2019-07-16 DIAGNOSIS — M5136 Other intervertebral disc degeneration, lumbar region: Secondary | ICD-10-CM | POA: Diagnosis not present

## 2019-07-16 DIAGNOSIS — M9905 Segmental and somatic dysfunction of pelvic region: Secondary | ICD-10-CM | POA: Diagnosis not present

## 2019-07-16 DIAGNOSIS — M955 Acquired deformity of pelvis: Secondary | ICD-10-CM | POA: Diagnosis not present

## 2019-07-18 DIAGNOSIS — M955 Acquired deformity of pelvis: Secondary | ICD-10-CM | POA: Diagnosis not present

## 2019-07-18 DIAGNOSIS — M9905 Segmental and somatic dysfunction of pelvic region: Secondary | ICD-10-CM | POA: Diagnosis not present

## 2019-07-18 DIAGNOSIS — M9903 Segmental and somatic dysfunction of lumbar region: Secondary | ICD-10-CM | POA: Diagnosis not present

## 2019-07-18 DIAGNOSIS — M5136 Other intervertebral disc degeneration, lumbar region: Secondary | ICD-10-CM | POA: Diagnosis not present

## 2019-07-20 DIAGNOSIS — M9905 Segmental and somatic dysfunction of pelvic region: Secondary | ICD-10-CM | POA: Diagnosis not present

## 2019-07-20 DIAGNOSIS — M9903 Segmental and somatic dysfunction of lumbar region: Secondary | ICD-10-CM | POA: Diagnosis not present

## 2019-07-20 DIAGNOSIS — M5136 Other intervertebral disc degeneration, lumbar region: Secondary | ICD-10-CM | POA: Diagnosis not present

## 2019-07-20 DIAGNOSIS — M955 Acquired deformity of pelvis: Secondary | ICD-10-CM | POA: Diagnosis not present

## 2019-07-22 ENCOUNTER — Encounter: Payer: Self-pay | Admitting: Internal Medicine

## 2019-07-22 DIAGNOSIS — M5136 Other intervertebral disc degeneration, lumbar region: Secondary | ICD-10-CM | POA: Diagnosis not present

## 2019-07-22 DIAGNOSIS — M9903 Segmental and somatic dysfunction of lumbar region: Secondary | ICD-10-CM | POA: Diagnosis not present

## 2019-07-22 DIAGNOSIS — M955 Acquired deformity of pelvis: Secondary | ICD-10-CM | POA: Diagnosis not present

## 2019-07-22 DIAGNOSIS — M9905 Segmental and somatic dysfunction of pelvic region: Secondary | ICD-10-CM | POA: Diagnosis not present

## 2019-07-28 DIAGNOSIS — I4891 Unspecified atrial fibrillation: Secondary | ICD-10-CM | POA: Diagnosis not present

## 2019-07-28 DIAGNOSIS — I10 Essential (primary) hypertension: Secondary | ICD-10-CM | POA: Diagnosis not present

## 2019-07-28 DIAGNOSIS — R002 Palpitations: Secondary | ICD-10-CM | POA: Diagnosis not present

## 2019-07-28 DIAGNOSIS — E782 Mixed hyperlipidemia: Secondary | ICD-10-CM | POA: Diagnosis not present

## 2019-07-29 LAB — LIPID PANEL
Cholesterol: 133 (ref 0–200)
HDL: 44 (ref 35–70)
LDL Cholesterol: 29
Triglycerides: 171 — AB (ref 40–160)

## 2019-07-29 LAB — CBC: RBC: 4.66 (ref 3.87–5.11)

## 2019-07-29 LAB — CBC AND DIFFERENTIAL
HCT: 41 (ref 36–46)
Hemoglobin: 13.6 (ref 12.0–16.0)
Neutrophils Absolute: 3
Platelets: 319 (ref 150–399)
WBC: 6.7

## 2019-07-29 LAB — COMPREHENSIVE METABOLIC PANEL
Albumin: 4.4 (ref 3.5–5.0)
Calcium: 10.2 (ref 8.7–10.7)
GFR calc non Af Amer: 81
Globulin: 1.9

## 2019-07-29 LAB — BASIC METABOLIC PANEL
BUN: 16 (ref 4–21)
CO2: 24 — AB (ref 13–22)
Chloride: 104 (ref 99–108)
Creatinine: 0.8 (ref 0.5–1.1)
Glucose: 260
Potassium: 4 (ref 3.4–5.3)
Sodium: 141 (ref 137–147)

## 2019-07-29 LAB — HEMOGLOBIN A1C: Hemoglobin A1C: 7.2

## 2019-07-30 LAB — HM DIABETES EYE EXAM

## 2019-08-04 DIAGNOSIS — I4891 Unspecified atrial fibrillation: Secondary | ICD-10-CM | POA: Diagnosis not present

## 2019-08-07 DIAGNOSIS — I4891 Unspecified atrial fibrillation: Secondary | ICD-10-CM | POA: Diagnosis not present

## 2019-08-07 DIAGNOSIS — I351 Nonrheumatic aortic (valve) insufficiency: Secondary | ICD-10-CM | POA: Diagnosis not present

## 2019-08-07 DIAGNOSIS — I2510 Atherosclerotic heart disease of native coronary artery without angina pectoris: Secondary | ICD-10-CM | POA: Diagnosis not present

## 2019-08-07 DIAGNOSIS — I10 Essential (primary) hypertension: Secondary | ICD-10-CM | POA: Diagnosis not present

## 2019-08-07 DIAGNOSIS — R0602 Shortness of breath: Secondary | ICD-10-CM | POA: Diagnosis not present

## 2019-08-10 ENCOUNTER — Ambulatory Visit (INDEPENDENT_AMBULATORY_CARE_PROVIDER_SITE_OTHER): Payer: PPO | Admitting: Internal Medicine

## 2019-08-10 ENCOUNTER — Other Ambulatory Visit: Payer: Self-pay

## 2019-08-10 ENCOUNTER — Encounter: Payer: Self-pay | Admitting: Internal Medicine

## 2019-08-10 ENCOUNTER — Ambulatory Visit (INDEPENDENT_AMBULATORY_CARE_PROVIDER_SITE_OTHER)
Admission: RE | Admit: 2019-08-10 | Discharge: 2019-08-10 | Disposition: A | Discharge status: Home / Self Care | Payer: PPO | Source: Ambulatory Visit | Attending: Internal Medicine | Admitting: Internal Medicine

## 2019-08-10 VITALS — BP 140/70 | HR 70 | Temp 97.8°F | Ht 67.0 in | Wt 213.0 lb

## 2019-08-10 DIAGNOSIS — M25551 Pain in right hip: Secondary | ICD-10-CM | POA: Diagnosis not present

## 2019-08-10 DIAGNOSIS — E781 Pure hyperglyceridemia: Secondary | ICD-10-CM

## 2019-08-10 DIAGNOSIS — G8929 Other chronic pain: Secondary | ICD-10-CM

## 2019-08-10 DIAGNOSIS — M5136 Other intervertebral disc degeneration, lumbar region: Secondary | ICD-10-CM | POA: Diagnosis not present

## 2019-08-10 DIAGNOSIS — I10 Essential (primary) hypertension: Secondary | ICD-10-CM

## 2019-08-10 DIAGNOSIS — R3915 Urgency of urination: Secondary | ICD-10-CM | POA: Diagnosis not present

## 2019-08-10 DIAGNOSIS — E118 Type 2 diabetes mellitus with unspecified complications: Secondary | ICD-10-CM

## 2019-08-10 DIAGNOSIS — E039 Hypothyroidism, unspecified: Secondary | ICD-10-CM

## 2019-08-10 DIAGNOSIS — M1611 Unilateral primary osteoarthritis, right hip: Secondary | ICD-10-CM | POA: Diagnosis not present

## 2019-08-10 DIAGNOSIS — K7581 Nonalcoholic steatohepatitis (NASH): Secondary | ICD-10-CM

## 2019-08-10 LAB — POC URINALSYSI DIPSTICK (AUTOMATED)
Bilirubin, UA: NEGATIVE
Blood, UA: NEGATIVE
Glucose, UA: NEGATIVE
Ketones, UA: NEGATIVE
Leukocytes, UA: NEGATIVE
Nitrite, UA: NEGATIVE
Protein, UA: NEGATIVE
Spec Grav, UA: 1.02 (ref 1.010–1.025)
Urobilinogen, UA: 0.2 E.U./dL
pH, UA: 5.5 (ref 5.0–8.0)

## 2019-08-10 NOTE — Progress Notes (Signed)
Subjective:  Patient ID: Kirsten [name], female    DOB: [birth year]  Age: 64 y.o. MRN: [id number]  CC: Hip Pain  This visit occurred during the SARS-CoV-2 public health emergency.  Safety protocols were in place, including screening questions prior to the visit, additional usage of staff PPE, and extensive cleaning of exam room while observing appropriate contact time as indicated for disinfecting solutions.    HPI [name] presents for f/up - She complains of a 1 month history of nontraumatic pain in her right lower back, right SI joint, and right hip.  She has been seeing a chiropractor without much relief from her symptoms.  She describes it as an achy sensation that radiates into her right leg.  The pain interferes with her sleep and her activities.  She is not getting much symptom relief with Tylenol.  She denies paresthesias in her lower extremities.  Outpatient Medications Prior to Visit  Medication Sig Dispense Refill  . albuterol (VENTOLIN HFA) 108 (90 Base) MCG/ACT inhaler Inhale 2 puffs into the lungs every 6 (six) hours as needed for wheezing or shortness of breath. 18 g 2  . amiodarone (PACERONE) 200 MG tablet Take 200 mg daily by mouth.    Marland Kitchen apixaban (ELIQUIS) 5 MG TABS tablet Take 1 tablet (5 mg total) by mouth 2 (two) times daily. 180 tablet 1  . atorvastatin (LIPITOR) 40 MG tablet TAKE 1 TABLET(40 MG) BY MOUTH DAILY 90 tablet 1  . Fluticasone-Salmeterol (ADVAIR DISKUS) 250-50 MCG/DOSE AEPB Inhale 1 puff into the lungs 2 (two) times daily. 60 each 11  . hydrochlorothiazide (MICROZIDE) 12.5 MG capsule Take 1 capsule (12.5 mg total) by mouth daily. 90 capsule 1  . levothyroxine (SYNTHROID) 100 MCG tablet TAKE 1 TABLET(100 MCG) BY MOUTH DAILY 90 tablet 1  . metFORMIN (GLUCOPHAGE) 1000 MG tablet TAKE 1 TABLET(1000 MG) BY MOUTH TWICE DAILY WITH A MEAL 180 tablet 1  . metoprolol succinate (TOPROL-XL) 50 MG 24 hr tablet Take 50 mg by mouth daily.     Marland Kitchen PARoxetine (PAXIL CR)  25 MG 24 hr tablet Take 1 tablet (25 mg total) by mouth daily. 30 tablet 9  . pioglitazone (ACTOS) 15 MG tablet Take 1 tablet (15 mg total) by mouth daily. 90 tablet 1  . zolpidem (AMBIEN) 10 MG tablet Take 1 tablet (10 mg total) by mouth at bedtime as needed. 30 tablet 5   No facility-administered medications prior to visit.    ROS Review of Systems  Constitutional: Negative.  Negative for appetite change, diaphoresis, fatigue, fever and unexpected weight change.  HENT: Negative.   Eyes: Negative for visual disturbance.  Respiratory: Negative for cough, chest tightness, shortness of breath and wheezing.   Cardiovascular: Negative for chest pain, palpitations and leg swelling.  Gastrointestinal: Negative for abdominal pain, constipation, diarrhea, nausea and vomiting.  Genitourinary: Positive for urgency. Negative for decreased urine volume, difficulty urinating, dysuria, frequency and hematuria.  Musculoskeletal: Positive for arthralgias and back pain. Negative for myalgias.  Skin: Negative.   Neurological: Negative.  Negative for dizziness, weakness and numbness.  Hematological: Negative for adenopathy. Does not bruise/bleed easily.  Psychiatric/Behavioral: Negative.     Objective:  BP 140/70 (BP Location: Left Arm, Patient Position: Sitting, Cuff Size: Large)   Pulse 70   Temp 97.8 F (36.6 C) (Oral)   Ht 5' 7"  (1.702 m)   Wt 213 lb (96.6 kg)   SpO2 97%   BMI 33.36 kg/m   BP Readings from Last 3  Encounters:  08/10/19 140/70  06/02/19 126/68  05/26/19 132/65    Wt Readings from Last 3 Encounters:  08/10/19 213 lb (96.6 kg)  06/02/19 211 lb (95.7 kg)  05/23/19 210 lb (95.3 kg)    Physical Exam Constitutional:      Appearance: She is obese. She is not ill-appearing.  HENT:     Nose: Nose normal.     Mouth/Throat:     Mouth: Mucous membranes are moist.  Eyes:     General: No scleral icterus.    Conjunctiva/sclera: Conjunctivae normal.  Cardiovascular:     Rate  and Rhythm: Normal rate and regular rhythm.     Heart sounds: No murmur heard.   Pulmonary:     Effort: Pulmonary effort is normal.     Breath sounds: No stridor. No wheezing, rhonchi or rales.  Abdominal:     General: Abdomen is protuberant. Bowel sounds are normal. There is no distension.     Palpations: Abdomen is soft. There is no hepatomegaly, splenomegaly or mass.     Tenderness: There is no abdominal tenderness.  Musculoskeletal:        General: Normal range of motion.     Cervical back: Normal and neck supple.     Thoracic back: Normal.     Lumbar back: Tenderness and bony tenderness present. No edema or deformity. Normal range of motion. Negative right straight leg raise test and negative left straight leg raise test.     Right lower leg: No edema.     Left lower leg: No edema.     Comments: There is TTP in right SI joint  Lymphadenopathy:     Cervical: No cervical adenopathy.  Skin:    General: Skin is warm and dry.  Neurological:     General: No focal deficit present.     Mental Status: She is alert and oriented to person, place, and time. Mental status is at baseline.     Lab Results  Component Value Date   WBC 6.7 07/29/2019   HGB 13.6 07/29/2019   HCT 41 07/29/2019   PLT 319 07/29/2019   GLUCOSE 314 (H) 06/02/2019   CHOL 133 07/29/2019   TRIG 171 (A) 07/29/2019   HDL 44 07/29/2019   LDLDIRECT 63.0 01/15/2019   LDLCALC 29 07/29/2019   ALT 87 (H) 06/02/2019   AST 40 (H) 06/02/2019   NA 141 07/29/2019   K 4.0 07/29/2019   CL 104 07/29/2019   CREATININE 0.8 07/29/2019   BUN 16 07/29/2019   CO2 24 (A) 07/29/2019   TSH 2.13 06/02/2019   INR 1.4 (H) 06/02/2019   HGBA1C 7.2 07/29/2019   MICROALBUR 1.2 09/18/2018    DG Chest 2 View  Result Date: 05/23/2019 CLINICAL DATA:  Patient reports shortness of breath and congestion since Friday.short of breath EXAM: CHEST - 2 VIEW COMPARISON:  Radiograph 04/23/2017 FINDINGS: Normal mediastinum and cardiac  silhouette. Normal pulmonary vasculature. No evidence of effusion, infiltrate, or pneumothorax. bibasilar atelectasis. Lungs hyperinflated. No acute bony abnormality. IMPRESSION: No active cardiopulmonary disease. Hyperinflated lungs and mild bibasilar atelectasis Electronically Signed   By: Suzy Bouchard M.D.   On: 05/23/2019 06:37   DG Hip Unilat W OR W/O Pelvis 2-3 Views Right  Result Date: 08/11/2019 CLINICAL DATA:  Pain for 1 month.  No known injury. EXAM: DG HIP (WITH OR WITHOUT PELVIS) 2-3V RIGHT COMPARISON:  02/27/2013. FINDINGS: Diffuse osteopenia. Degenerative change lumbar spine, both SI joints, both hips. No acute bony or joint abnormality.  No evidence of fracture or dislocation. IMPRESSION: Diffuse osteopenia. Degenerative changes lumbar spine, both SI joints, both hips. No acute bony abnormality identified. Electronically Signed   By: Marcello Moores  Register   On: 08/11/2019 05:17     Assessment & Plan:   [place] was seen today for hip pain.  Diagnoses and all orders for this visit:  Essential hypertension, benign  NASH (nonalcoholic steatohepatitis)  Type II diabetes mellitus with manifestations ([place])  Hypothyroidism, unspecified type  Hypertriglyceridemia  Urinary urgency - Her UA is normal. -     POCT Urinalysis Dipstick (Automated)  Chronic right hip pain- Based on her symptoms, exam, and plain films she has degenerative changes in the lumbar spine, SI joint, and right hip.  I recommended that she treat this with nabumetone and tramadol. -     DG Hip Unilat W OR W/O Pelvis 2-3 Views Right; Future  DDD (degenerative disc disease), lumbar -     Nabumetone (RELAFEN DS) 1000 MG TABS; Take 1 tablet by mouth daily as needed. -     traMADol (ULTRAM) 50 MG tablet; Take 1 tablet (50 mg total) by mouth every 6 (six) hours as needed for up to 5 days for moderate pain.  Primary osteoarthritis of right hip -     Nabumetone (RELAFEN DS) 1000 MG TABS; Take 1 tablet by mouth daily as  needed. -     traMADol (ULTRAM) 50 MG tablet; Take 1 tablet (50 mg total) by mouth every 6 (six) hours as needed for up to 5 days for moderate pain.   I am having Kiearra A. Froemming start on Relafen DS and traMADol. I am also having her maintain her metoprolol succinate, amiodarone, PARoxetine, apixaban, albuterol, zolpidem, Fluticasone-Salmeterol, pioglitazone, hydrochlorothiazide, metFORMIN, levothyroxine, and atorvastatin.  Meds ordered this encounter  Medications  . Nabumetone (RELAFEN DS) 1000 MG TABS    Sig: Take 1 tablet by mouth daily as needed.    Dispense:  12 tablet    Refill:  0  . traMADol (ULTRAM) 50 MG tablet    Sig: Take 1 tablet (50 mg total) by mouth every 6 (six) hours as needed for up to 5 days for moderate pain.    Dispense:  90 tablet    Refill:  2     Follow-up: No follow-ups on file.  [name], MD

## 2019-08-11 ENCOUNTER — Encounter: Payer: Self-pay | Admitting: Internal Medicine

## 2019-08-11 DIAGNOSIS — M5136 Other intervertebral disc degeneration, lumbar region: Secondary | ICD-10-CM | POA: Insufficient documentation

## 2019-08-11 DIAGNOSIS — M1611 Unilateral primary osteoarthritis, right hip: Secondary | ICD-10-CM | POA: Insufficient documentation

## 2019-08-11 MED ORDER — TRAMADOL HCL 50 MG PO TABS: 50.0000 mg | ORAL_TABLET | Freq: Four times a day (QID) | ORAL | 2 refills | Status: AC | PRN

## 2019-08-11 MED ORDER — RELAFEN DS 1000 MG PO TABS: 1.0000 | ORAL_TABLET | Freq: Every day | ORAL | 0 refills | Status: DC | PRN

## 2019-08-11 NOTE — Patient Instructions (Signed)

## 2019-08-21 ENCOUNTER — Telehealth: Payer: Self-pay | Admitting: Internal Medicine

## 2019-08-21 DIAGNOSIS — G4733 Obstructive sleep apnea (adult) (pediatric): Secondary | ICD-10-CM

## 2019-08-21 NOTE — Telephone Encounter (Signed)
Pt returning missed call. States she'll be free for the next 2.5 hrs

## 2019-08-21 NOTE — Telephone Encounter (Signed)
Lm for pt

## 2019-08-21 NOTE — Telephone Encounter (Signed)
Called and spoke to pt, who is requesting a Rx for a new cpap machine as her current machine is 15y old.  Pt has been scheduled for f/u with Dr. Mortimer Fries on 11/11/2019 at 4:00. Pt is aware that Dr. Mortimer Fries is unavailable until Monday. Pt is okay with waiting until Monday for a response.   Dr. Mortimer Fries, please advise. thanks

## 2019-08-23 ENCOUNTER — Other Ambulatory Visit: Payer: Self-pay | Admitting: Certified Nurse Midwife

## 2019-08-24 NOTE — Telephone Encounter (Signed)
What does patient need for new machine, can order accordingly

## 2019-08-24 NOTE — Telephone Encounter (Signed)
Order has been placed for new cpap machine.  Pt is aware and voiced her understanding.  Nothing further is needed.

## 2019-08-26 DIAGNOSIS — M5416 Radiculopathy, lumbar region: Secondary | ICD-10-CM | POA: Diagnosis not present

## 2019-08-26 DIAGNOSIS — M533 Sacrococcygeal disorders, not elsewhere classified: Secondary | ICD-10-CM | POA: Diagnosis not present

## 2019-08-31 DIAGNOSIS — M533 Sacrococcygeal disorders, not elsewhere classified: Secondary | ICD-10-CM | POA: Diagnosis not present

## 2019-09-01 DIAGNOSIS — M955 Acquired deformity of pelvis: Secondary | ICD-10-CM | POA: Diagnosis not present

## 2019-09-01 DIAGNOSIS — M9903 Segmental and somatic dysfunction of lumbar region: Secondary | ICD-10-CM | POA: Diagnosis not present

## 2019-09-01 DIAGNOSIS — M5136 Other intervertebral disc degeneration, lumbar region: Secondary | ICD-10-CM | POA: Diagnosis not present

## 2019-09-01 DIAGNOSIS — M9905 Segmental and somatic dysfunction of pelvic region: Secondary | ICD-10-CM | POA: Diagnosis not present

## 2019-09-02 DIAGNOSIS — M5136 Other intervertebral disc degeneration, lumbar region: Secondary | ICD-10-CM | POA: Diagnosis not present

## 2019-09-02 DIAGNOSIS — M9903 Segmental and somatic dysfunction of lumbar region: Secondary | ICD-10-CM | POA: Diagnosis not present

## 2019-09-02 DIAGNOSIS — M955 Acquired deformity of pelvis: Secondary | ICD-10-CM | POA: Diagnosis not present

## 2019-09-02 DIAGNOSIS — M9905 Segmental and somatic dysfunction of pelvic region: Secondary | ICD-10-CM | POA: Diagnosis not present

## 2019-09-03 DIAGNOSIS — M5136 Other intervertebral disc degeneration, lumbar region: Secondary | ICD-10-CM | POA: Diagnosis not present

## 2019-09-03 DIAGNOSIS — M9905 Segmental and somatic dysfunction of pelvic region: Secondary | ICD-10-CM | POA: Diagnosis not present

## 2019-09-03 DIAGNOSIS — M9903 Segmental and somatic dysfunction of lumbar region: Secondary | ICD-10-CM | POA: Diagnosis not present

## 2019-09-03 DIAGNOSIS — M955 Acquired deformity of pelvis: Secondary | ICD-10-CM | POA: Diagnosis not present

## 2019-09-05 DIAGNOSIS — M5136 Other intervertebral disc degeneration, lumbar region: Secondary | ICD-10-CM | POA: Diagnosis not present

## 2019-09-05 DIAGNOSIS — M955 Acquired deformity of pelvis: Secondary | ICD-10-CM | POA: Diagnosis not present

## 2019-09-05 DIAGNOSIS — M9903 Segmental and somatic dysfunction of lumbar region: Secondary | ICD-10-CM | POA: Diagnosis not present

## 2019-09-05 DIAGNOSIS — M9905 Segmental and somatic dysfunction of pelvic region: Secondary | ICD-10-CM | POA: Diagnosis not present

## 2019-09-07 ENCOUNTER — Ambulatory Visit (INDEPENDENT_AMBULATORY_CARE_PROVIDER_SITE_OTHER): Payer: PPO | Admitting: Certified Nurse Midwife

## 2019-09-07 ENCOUNTER — Telehealth: Payer: Self-pay | Admitting: Certified Nurse Midwife

## 2019-09-07 ENCOUNTER — Encounter: Payer: Self-pay | Admitting: Certified Nurse Midwife

## 2019-09-07 VITALS — BP 107/61 | HR 77 | Ht 67.0 in | Wt 209.8 lb

## 2019-09-07 DIAGNOSIS — M9903 Segmental and somatic dysfunction of lumbar region: Secondary | ICD-10-CM | POA: Diagnosis not present

## 2019-09-07 DIAGNOSIS — M955 Acquired deformity of pelvis: Secondary | ICD-10-CM | POA: Diagnosis not present

## 2019-09-07 DIAGNOSIS — R300 Dysuria: Secondary | ICD-10-CM

## 2019-09-07 DIAGNOSIS — M9905 Segmental and somatic dysfunction of pelvic region: Secondary | ICD-10-CM | POA: Diagnosis not present

## 2019-09-07 DIAGNOSIS — M5136 Other intervertebral disc degeneration, lumbar region: Secondary | ICD-10-CM | POA: Diagnosis not present

## 2019-09-07 LAB — POCT URINALYSIS DIPSTICK
Blood, UA: NEGATIVE
Glucose, UA: NEGATIVE
Leukocytes, UA: NEGATIVE
Nitrite, UA: NEGATIVE
Protein, UA: NEGATIVE
Spec Grav, UA: >=1.03 — AB (ref 1.010–1.025)
Urobilinogen, UA: 0.2 E.U./dL
pH, UA: 5 (ref 5.0–8.0)

## 2019-09-07 NOTE — Progress Notes (Signed)
Pt complains of uti, Urine dropped off.   Kirsten [name], CNM

## 2019-09-07 NOTE — Telephone Encounter (Signed)
I am fine with a urine drop off for her, please call her and let her know.   Thanks  Philip Aspen, CNM

## 2019-09-07 NOTE — Telephone Encounter (Signed)
Pt called in an stated that she is having some UTI symptoms. The pt said she has some pain while urinating. The pt is requesting a urine drop off. I told the pt I will have to send a message. The pt verbally understood that I will call her back once I hear from the provider. Please advise

## 2019-09-08 DIAGNOSIS — M955 Acquired deformity of pelvis: Secondary | ICD-10-CM | POA: Diagnosis not present

## 2019-09-08 DIAGNOSIS — M5136 Other intervertebral disc degeneration, lumbar region: Secondary | ICD-10-CM | POA: Diagnosis not present

## 2019-09-08 DIAGNOSIS — M9905 Segmental and somatic dysfunction of pelvic region: Secondary | ICD-10-CM | POA: Diagnosis not present

## 2019-09-08 DIAGNOSIS — M9903 Segmental and somatic dysfunction of lumbar region: Secondary | ICD-10-CM | POA: Diagnosis not present

## 2019-09-08 NOTE — Telephone Encounter (Signed)
Pt was seen in the office yesterday for urine drop off.

## 2019-09-09 ENCOUNTER — Other Ambulatory Visit: Payer: Self-pay | Admitting: Certified Nurse Midwife

## 2019-09-09 DIAGNOSIS — M5136 Other intervertebral disc degeneration, lumbar region: Secondary | ICD-10-CM | POA: Diagnosis not present

## 2019-09-09 DIAGNOSIS — M955 Acquired deformity of pelvis: Secondary | ICD-10-CM | POA: Diagnosis not present

## 2019-09-09 DIAGNOSIS — M9905 Segmental and somatic dysfunction of pelvic region: Secondary | ICD-10-CM | POA: Diagnosis not present

## 2019-09-09 DIAGNOSIS — M9903 Segmental and somatic dysfunction of lumbar region: Secondary | ICD-10-CM | POA: Diagnosis not present

## 2019-09-09 LAB — URINE CULTURE

## 2019-09-09 MED ORDER — NITROFURANTOIN MONOHYD MACRO 100 MG PO CAPS
100.0000 mg | ORAL_CAPSULE | Freq: Two times a day (BID) | ORAL | 0 refills | Status: AC
Start: 2019-09-09 — End: 2019-09-19

## 2019-09-10 ENCOUNTER — Ambulatory Visit: Payer: PPO | Admitting: Gastroenterology

## 2019-09-21 DIAGNOSIS — M9905 Segmental and somatic dysfunction of pelvic region: Secondary | ICD-10-CM | POA: Diagnosis not present

## 2019-09-21 DIAGNOSIS — M955 Acquired deformity of pelvis: Secondary | ICD-10-CM | POA: Diagnosis not present

## 2019-09-21 DIAGNOSIS — M5136 Other intervertebral disc degeneration, lumbar region: Secondary | ICD-10-CM | POA: Diagnosis not present

## 2019-09-21 DIAGNOSIS — M533 Sacrococcygeal disorders, not elsewhere classified: Secondary | ICD-10-CM | POA: Diagnosis not present

## 2019-09-21 DIAGNOSIS — M9903 Segmental and somatic dysfunction of lumbar region: Secondary | ICD-10-CM | POA: Diagnosis not present

## 2019-09-22 ENCOUNTER — Ambulatory Visit: Payer: PPO | Admitting: Certified Nurse Midwife

## 2019-09-22 ENCOUNTER — Other Ambulatory Visit (HOSPITAL_COMMUNITY)
Admission: RE | Admit: 2019-09-22 | Discharge: 2019-09-22 | Disposition: A | Discharge status: Home / Self Care | Payer: PPO | Source: Ambulatory Visit | Attending: Certified Nurse Midwife | Admitting: Certified Nurse Midwife

## 2019-09-22 ENCOUNTER — Encounter: Payer: Self-pay | Admitting: Certified Nurse Midwife

## 2019-09-22 VITALS — BP 124/78 | HR 72 | Ht 67.0 in | Wt 213.0 lb

## 2019-09-22 DIAGNOSIS — N898 Other specified noninflammatory disorders of vagina: Secondary | ICD-10-CM | POA: Insufficient documentation

## 2019-09-22 DIAGNOSIS — N390 Urinary tract infection, site not specified: Secondary | ICD-10-CM | POA: Diagnosis not present

## 2019-09-22 DIAGNOSIS — Z01419 Encounter for gynecological examination (general) (routine) without abnormal findings: Secondary | ICD-10-CM | POA: Insufficient documentation

## 2019-09-22 DIAGNOSIS — G4733 Obstructive sleep apnea (adult) (pediatric): Secondary | ICD-10-CM | POA: Diagnosis not present

## 2019-09-22 NOTE — Patient Instructions (Signed)

## 2019-09-22 NOTE — Progress Notes (Signed)
GYNECOLOGY ANNUAL PREVENTATIVE CARE ENCOUNTER NOTE  History:     Kirsten [name] is a 64 y.o. [id number] female here for a routine annual gynecologic exam.  Current complaints: vaginal odor, pelvic pressure.   Denies abnormal vaginal bleeding, discharge, pelvic pain, problems with intercourse or other gynecologic concerns.     Social Relationship: married Lives with spouse Works: none Exercise; walking  Smoke?alcoho/srugs: deneis  Gynecologic History No LMP recorded. Patient is postmenopausal. Contraception: none Last Pap .01/31/2017 negative , hpv negative mammagram: 04/10/19. Results were: normal  Obstetric History OB History  Gravida Para Term Preterm AB Living  3 3 3     3   SAB TAB Ectopic Multiple Live Births          3    # Outcome Date GA Lbr Len/2nd Weight Sex Delivery Anes PTL Lv  3 Term 1988   8 lb 10 oz (3.912 kg) M CS-LTranv   LIV  2 Term 1986   9 lb 4 oz (4.196 kg) F CS-LTranv   LIV  1 Term 1983   8 lb 13 oz (3.997 kg) F CS-LTranv   LIV     Complications: Breech birth    Past Medical History:  Diagnosis Date   A-fib ([place])    Anxiety disorder    Asthma    Dyspnea    Gastroschisis    umphalocele, rotated organs abd repair until age 49   GERD (gastroesophageal reflux disease)    HTN (hypertension)    Hyperlipidemia    IBS (irritable bowel syndrome)    Morbid obesity ([facility])    Target wt - 185  for BMI < 30   Obesity    OSA on CPAP    Pulmonary fibrosis ([facility])    SBO (small bowel obstruction) ([place])    Resolved with NG/Bowel rest around 2009   Sleep apnea    Type II or unspecified type diabetes mellitus without mention of complication, not stated as uncontrolled     Past Surgical History:  Procedure Laterality Date   BREAST EXCISIONAL BIOPSY Left 03/19/2012   neg   CESAREAN SECTION     CHOLECYSTECTOMY  1992   Newborn Surgery - GI - ORGANS OUTSIDE ABDOMEN     Small Bowel Repair     TUBAL LIGATION  1988    Current  Outpatient Medications on File Prior to Visit  Medication Sig Dispense Refill   albuterol (VENTOLIN HFA) 108 (90 Base) MCG/ACT inhaler Inhale 2 puffs into the lungs every 6 (six) hours as needed for wheezing or shortness of breath. 18 g 2   amiodarone (PACERONE) 200 MG tablet Take 200 mg daily by mouth.     apixaban (ELIQUIS) 5 MG TABS tablet Take 1 tablet (5 mg total) by mouth 2 (two) times daily. 180 tablet 1   atorvastatin (LIPITOR) 40 MG tablet TAKE 1 TABLET(40 MG) BY MOUTH DAILY 90 tablet 1   Fluticasone-Salmeterol (ADVAIR DISKUS) 250-50 MCG/DOSE AEPB Inhale 1 puff into the lungs 2 (two) times daily. 60 each 11   hydrochlorothiazide (MICROZIDE) 12.5 MG capsule Take 1 capsule (12.5 mg total) by mouth daily. 90 capsule 1   levothyroxine (SYNTHROID) 100 MCG tablet TAKE 1 TABLET(100 MCG) BY MOUTH DAILY 90 tablet 1   metFORMIN (GLUCOPHAGE) 1000 MG tablet TAKE 1 TABLET(1000 MG) BY MOUTH TWICE DAILY WITH A MEAL 180 tablet 1   metoprolol succinate (TOPROL-XL) 50 MG 24 hr tablet Take 50 mg by mouth daily.      PARoxetine (  PAXIL-CR) 25 MG 24 hr tablet TAKE 1 TABLET(25 MG) BY MOUTH DAILY 30 tablet 0   zolpidem (AMBIEN) 10 MG tablet Take 1 tablet (10 mg total) by mouth at bedtime as needed. 30 tablet 5   [DISCONTINUED] atorvastatin (LIPITOR) 40 MG tablet TAKE 1 TABLET(40 MG) BY MOUTH DAILY 90 tablet 1   No current facility-administered medications on file prior to visit.    Allergies  Allergen Reactions   Factive [Gemifloxacin Mesylate] Rash   Crestor [Rosuvastatin]     GI upset   Sulfonamide Derivatives     REACTION: rash   Gemifloxacin Rash   Penicillins Hives and Rash    Has patient had a PCN reaction causing immediate rash, facial/tongue/throat swelling, SOB or lightheadedness with hypotension: No Has patient had a PCN reaction causing severe rash involving mucus membranes or skin necrosis: No Has patient had a PCN reaction that required hospitalization No Has patient had  a PCN reaction occurring within the last 10 years: No If all of the above answers are "NO", then may proceed with Cephalosporin use.     Social History:  reports that she has never smoked. She has never used smokeless tobacco. She reports current alcohol use of about 2.0 standard drinks of alcohol per week. She reports that she does not use drugs.  Family History  Problem Relation Age of Onset   Lymphoma Mother    Diabetes type II Father    Colon cancer Father    Diabetes type I Son    Diabetes Maternal Grandmother    Goiter Neg Hx    Breast cancer Neg Hx    Ovarian cancer Neg Hx     The following portions of the patient's history were reviewed and updated as appropriate: allergies, current medications, past family history, past medical history, past social history, past surgical history and problem list.  Review of Systems Pertinent items noted in HPI and remainder of comprehensive ROS otherwise negative.  Physical Exam:  Ht 5' 7"  (1.702 m)    Wt 213 lb (96.6 kg)    BMI 33.36 kg/m  CONSTITUTIONAL: Well-developed, well-nourished, obese female in no acute distress.  HENT:  Normocephalic, atraumatic, External right and left ear normal. Oropharynx is clear and moist EYES: Conjunctivae and EOM are normal. Pupils are equal, round, and reactive to light. No scleral icterus.  NECK: Normal range of motion, supple, no masses.  Normal thyroid.  SKIN: Skin is warm and dry. No rash noted. Not diaphoretic. No erythema. No pallor. MUSCULOSKELETAL: Normal range of motion. No tenderness.  No cyanosis, clubbing, or edema.  2+ distal pulses. NEUROLOGIC: Alert and oriented to person, place, and time. Normal reflexes, muscle tone coordination.  PSYCHIATRIC: Normal mood and affect. Normal behavior. Normal judgment and thought content. CARDIOVASCULAR: Normal heart rate noted, regular rhythm RESPIRATORY: Clear to auscultation bilaterally. Effort and breath sounds normal, no problems with  respiration noted. BREASTS: Symmetric in size. No masses, tenderness, skin changes, nipple drainage, or lymphadenopathy bilaterally.  ABDOMEN: Soft, no distention noted.  No tenderness, rebound or guarding.  PELVIC: Normal appearing external genitalia and urethral meatus; normal appearing vaginal mucosa and cervix.  No abnormal discharge noted.  Pap smear not indicated.Swab collected   Normal uterine size, no other palpable masses, no uterine or adnexal tenderness.  Assessment and Plan:  Annual [facility] GYN exam Pap smear not due Mammogram has been completed for the year Colonoscopy scheduled for end of this month Labs: none due Refills: none Referral: discussed seeing MD (cherry  to discuss pessary) Routine preventative health maintenance measures emphasized. Please refer to After Visit Summary for other counseling recommendations.      Philip Aspen, CNM

## 2019-09-23 ENCOUNTER — Other Ambulatory Visit: Payer: Self-pay | Admitting: Certified Nurse Midwife

## 2019-09-24 LAB — CERVICOVAGINAL ANCILLARY ONLY
Bacterial Vaginitis (gardnerella): NEGATIVE
Candida Glabrata: NEGATIVE
Candida Vaginitis: NEGATIVE
Comment: NEGATIVE
Comment: NEGATIVE
Comment: NEGATIVE

## 2019-09-24 LAB — URINE CULTURE: Organism ID, Bacteria: NO GROWTH

## 2019-09-28 ENCOUNTER — Other Ambulatory Visit: Payer: Self-pay

## 2019-09-28 DIAGNOSIS — M25551 Pain in right hip: Secondary | ICD-10-CM | POA: Diagnosis not present

## 2019-09-28 DIAGNOSIS — M533 Sacrococcygeal disorders, not elsewhere classified: Secondary | ICD-10-CM | POA: Diagnosis not present

## 2019-09-28 MED ORDER — PAROXETINE HCL ER 25 MG PO TB24: ORAL_TABLET | ORAL | 2 refills | Status: DC

## 2019-09-29 ENCOUNTER — Ambulatory Visit: Payer: PPO | Admitting: Gastroenterology

## 2019-09-29 ENCOUNTER — Encounter: Payer: Self-pay | Admitting: Gastroenterology

## 2019-09-29 VITALS — BP 142/76 | HR 83 | Ht 67.0 in | Wt 212.0 lb

## 2019-09-29 DIAGNOSIS — Z1211 Encounter for screening for malignant neoplasm of colon: Secondary | ICD-10-CM

## 2019-09-29 LAB — HM COLONOSCOPY

## 2019-09-29 MED ORDER — SUTAB 1479-225-188 MG PO TABS: 1.0000 | ORAL_TABLET | Freq: Once | ORAL | 0 refills | Status: AC

## 2019-09-29 NOTE — Progress Notes (Signed)
Referring Provider: [name], MD Primary Care Physician:  [name], MD  Reason for Consultation:  Need for colon cancer screening   IMPRESSION:  Need for colon cancer screening Father with jejunal cancer Eliquis for atrial fibrillation  Colonoscopy recommended for colon cancer screening. I have recommended holding Eliquis for 2 days before endoscopy.  I discussed with the patient that there is a low, but real, risk of a cardiovascular event such as heart attack, stroke, or embolism/thrombosis while off Eliquis. Will communicate by phone or EMR with patient's prescribing provider to confirm that holding the Eliquis is appropriate at this time.   Reviewed family history of jejunal cancer. Histology unknown. We have no formal guidelines for screening for small bowel tumors. There is no other known family history of malignancy.    PLAN: Colonoscopy after an Eliquis washout out (Dr. Humphrey Rolls at [facility]) Obtain recent echo from Dr. Humphrey Rolls in preparation for colonoscopy  Please see the "Patient Instructions" section for addition details about the plan.  HPI: Kirsten [name] is a 64 y.o. female who is due for colon cancer screening. The history is obtained through the patient and review of her electronic health record.  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 She was overall well until she had H1N1 several years ago. Since that time she has had asthma, OSA on CPAP, pulmonary fibrosis not requiring chronic oxygen, obesity, hypertension, and atrial fibrillation on Eliquis. She reports a recent echocardiogram obtained at Dr. Laurelyn Sickle office.   She has completed the Covid vaccine. Retired from a bank.   She has intermittent postprandial abdominal discomfort that she attributes to the omphalocele. No other ongoing GI symptoms.   Normal CBC 07/29/19 Screening Colonoscopy with Dr. Deatra Ina 06/16/09: Normal  Father  with jejunal cancer at age 60. No known family history of colon cancer or polyps. No family history of uterine/endometrial cancer, pancreatic cancer or gastric/stomach cancer.   Past Medical History:  Diagnosis Date  . A-fib ([place])   . Anxiety disorder   . Asthma   . Dyspnea   . Gastroschisis    umphalocele, rotated organs abd repair until age 79  . GERD (gastroesophageal reflux disease)   . HTN (hypertension)   . Hyperlipidemia   . IBS (irritable bowel syndrome)   . Morbid obesity ([place])    Target wt - 185  for BMI < 30  . Obesity   . OSA on CPAP   . Pulmonary fibrosis ([place])   . SBO (small bowel obstruction) ([place])    Resolved with NG/Bowel rest around 2009  . Sleep apnea   . Type II or unspecified type diabetes mellitus without mention of complication, not stated as uncontrolled     Past Surgical History:  Procedure Laterality Date  . BREAST EXCISIONAL BIOPSY Left 03/19/2012   neg  . CESAREAN SECTION    . CHOLECYSTECTOMY  1992  . Newborn Surgery - GI - ORGANS OUTSIDE ABDOMEN    . Small Bowel Repair    . TUBAL LIGATION  1988    Current Outpatient Medications  Medication Sig Dispense Refill  . albuterol (VENTOLIN HFA) 108 (90 Base) MCG/ACT inhaler Inhale 2 puffs into the lungs every 6 (six) hours as needed for wheezing or shortness of breath. 18 g 2  . amiodarone (PACERONE) 200 MG tablet Take 200 mg daily by mouth.    Marland Kitchen apixaban (ELIQUIS) 5 MG TABS tablet Take  1 tablet (5 mg total) by mouth 2 (two) times daily. 180 tablet 1  . atorvastatin (LIPITOR) 40 MG tablet TAKE 1 TABLET(40 MG) BY MOUTH DAILY 90 tablet 1  . hydrochlorothiazide (MICROZIDE) 12.5 MG capsule Take 1 capsule (12.5 mg total) by mouth daily. 90 capsule 1  . levothyroxine (SYNTHROID) 100 MCG tablet TAKE 1 TABLET(100 MCG) BY MOUTH DAILY 90 tablet 1  . metFORMIN (GLUCOPHAGE) 1000 MG tablet TAKE 1 TABLET(1000 MG) BY MOUTH TWICE DAILY WITH A MEAL 180 tablet 1  . metoprolol succinate (TOPROL-XL) 50 MG 24 hr tablet  Take 50 mg by mouth daily.     Marland Kitchen PARoxetine (PAXIL-CR) 25 MG 24 hr tablet TAKE 1 TABLET(25 MG) BY MOUTH DAILY 30 tablet 2  . zolpidem (AMBIEN) 10 MG tablet Take 1 tablet (10 mg total) by mouth at bedtime as needed. 30 tablet 5   No current facility-administered medications for this visit.    Allergies as of 09/29/2019 - Review Complete 09/29/2019  Allergen Reaction Noted  . Factive [gemifloxacin mesylate] Rash 06/07/2010  . Crestor [rosuvastatin]  10/12/2013  . Sulfonamide derivatives    . Gemifloxacin Rash 10/04/2014  . Penicillins Hives and Rash     Family History  Problem Relation Age of Onset  . Lymphoma Mother   . Diabetes type II Father   . Colon cancer Father   . Diabetes type I Son   . Diabetes Maternal Grandmother   . Goiter Neg Hx   . Breast cancer Neg Hx   . Ovarian cancer Neg Hx     Social History   Socioeconomic History  . Marital status: Married    Spouse name: Not on file  . Number of children: Not on file  . Years of education: Not on file  . Highest education level: Not on file  Occupational History  . Occupation: Retired    Comment: Research scientist (physical sciences) at [employer]  . Smoking status: Never Smoker  . Smokeless tobacco: Never Used  . Tobacco comment: "tried as a teen"  Vaping Use  . Vaping Use: Never used  Substance and Sexual Activity  . Alcohol use: Yes    Alcohol/week: 2.0 standard drinks    Types: 2 Glasses of wine per week    Comment: rare  . Drug use: No  . Sexual activity: Yes    Birth control/protection: Surgical  Other Topics Concern  . Not on file  Social History Narrative   Regular Exercise -  YES   Daily Caffeine Use:  2   Active and independent at baseline      Social Determinants of Health   Financial Resource Strain:   . Difficulty of Paying Living Expenses:   Food Insecurity:   . Worried About Charity fundraiser in the Last Year:   . Arboriculturist in the Last Year:   Transportation Needs:   . Lexicographer (Medical):   Marland Kitchen Lack of Transportation (Non-Medical):   Physical Activity:   . Days of Exercise per Week:   . Minutes of Exercise per Session:   Stress:   . Feeling of Stress :   Social Connections:   . Frequency of Communication with Friends and Family:   . Frequency of Social Gatherings with Friends and Family:   . Attends Religious Services:   . Active Member of Clubs or Organizations:   . Attends Archivist Meetings:   Marland Kitchen Marital Status:   Intimate Partner Violence:   .  Fear of Current or Ex-Partner:   . Emotionally Abused:   Marland Kitchen Physically Abused:   . Sexually Abused:     Review of Systems: 12 system ROS is negative except as noted above.   Physical Exam: General:   Alert,  well-nourished, pleasant and cooperative in NAD Head:  Normocephalic and atraumatic. Eyes:  Sclera clear, no icterus.   Conjunctiva pink. Ears:  Normal auditory acuity. Nose:  No deformity, discharge,  or lesions. Mouth:  No deformity or lesions.   Neck:  Supple; no masses or thyromegaly. Lungs:  Clear throughout to auscultation.   No wheezes. Heart:  Regular rate and rhythm; no murmurs. Abdomen:  Soft,nontender, nondistended, normal bowel sounds, no rebound or guarding. No hepatosplenomegaly.   Rectal:  Deferred  Msk:  Symmetrical. No boney deformities LAD: No inguinal or umbilical LAD Extremities:  No clubbing or edema. Neurologic:  Alert and  oriented x4;  grossly nonfocal Skin:  Intact without significant lesions or rashes. Psych:  Alert and cooperative. Normal mood and affect.     Ronte Parker L. Tarri Glenn, MD, MPH 09/29/2019, 10:53 AM

## 2019-09-29 NOTE — Patient Instructions (Addendum)
It was a pleasure to meet you today.   It is time for a colonoscopy. We will work with Dr. Humphrey Rolls to coordinate holding your Eliquis in preparation for the procedure.    Tips for colonoscopy:  - Stay well hydrated for 3-4 days prior to the exam. This reduces nausea and dehydration.  - To prevent skin/hemorrhoid irritation - prior to wiping, put A&Dointment or vaseline on the toilet paper. - Keep a towel or pad on the bed.  - Drink  64oz of clear liquids in the morning of prep day (prior to starting the prep) to be sure that there is enough fluid to flush the colon and stay hydrated!!!! This is in addition to the fluids required for preparation. - Use of a flavored hard candy, such as grape Anise Salvo, can counteract some of the flavor of the prep and may prevent some nausea.   You have been scheduled for a colonoscopy. Please follow written instructions given to you at your visit today.  Please pick up your prep supplies at the pharmacy within the next 1-3 days. If you use inhalers (even only as needed), please bring them with you on the day of your procedure.  Due to recent changes in healthcare laws, you may see the results of your imaging and laboratory studies on MyChart before your provider has had a chance to review them.  We understand that in some cases there may be results that are confusing or concerning to you. Not all laboratory results come back in the same time frame and the provider may be waiting for multiple results in order to interpret others.  Please give [place] 48 hours in order for your provider to thoroughly review all the results before contacting the office for clarification of your results.

## 2019-10-05 ENCOUNTER — Encounter: Payer: Self-pay | Admitting: Internal Medicine

## 2019-10-08 DIAGNOSIS — M533 Sacrococcygeal disorders, not elsewhere classified: Secondary | ICD-10-CM | POA: Diagnosis not present

## 2019-10-16 ENCOUNTER — Other Ambulatory Visit: Payer: Self-pay | Admitting: Internal Medicine

## 2019-10-16 DIAGNOSIS — G4700 Insomnia, unspecified: Secondary | ICD-10-CM

## 2019-10-16 DIAGNOSIS — G4730 Sleep apnea, unspecified: Secondary | ICD-10-CM

## 2019-10-16 DIAGNOSIS — F409 Phobic anxiety disorder, unspecified: Secondary | ICD-10-CM

## 2019-10-19 DIAGNOSIS — M533 Sacrococcygeal disorders, not elsewhere classified: Secondary | ICD-10-CM | POA: Diagnosis not present

## 2019-10-28 DIAGNOSIS — M533 Sacrococcygeal disorders, not elsewhere classified: Secondary | ICD-10-CM | POA: Diagnosis not present

## 2019-11-10 ENCOUNTER — Encounter: Payer: Self-pay | Admitting: Gastroenterology

## 2019-11-11 ENCOUNTER — Ambulatory Visit: Payer: PPO | Admitting: Internal Medicine

## 2019-11-16 ENCOUNTER — Ambulatory Visit (INDEPENDENT_AMBULATORY_CARE_PROVIDER_SITE_OTHER): Payer: PPO | Admitting: Internal Medicine

## 2019-11-16 ENCOUNTER — Encounter: Payer: Self-pay | Admitting: Internal Medicine

## 2019-11-16 ENCOUNTER — Other Ambulatory Visit: Payer: Self-pay

## 2019-11-16 VITALS — BP 124/86 | HR 78 | Temp 98.8°F | Ht 67.0 in | Wt 209.0 lb

## 2019-11-16 DIAGNOSIS — E038 Other specified hypothyroidism: Secondary | ICD-10-CM | POA: Diagnosis not present

## 2019-11-16 DIAGNOSIS — K7581 Nonalcoholic steatohepatitis (NASH): Secondary | ICD-10-CM

## 2019-11-16 DIAGNOSIS — E039 Hypothyroidism, unspecified: Secondary | ICD-10-CM | POA: Diagnosis not present

## 2019-11-16 DIAGNOSIS — E032 Hypothyroidism due to medicaments and other exogenous substances: Secondary | ICD-10-CM | POA: Diagnosis not present

## 2019-11-16 DIAGNOSIS — F5105 Insomnia due to other mental disorder: Secondary | ICD-10-CM | POA: Diagnosis not present

## 2019-11-16 DIAGNOSIS — E785 Hyperlipidemia, unspecified: Secondary | ICD-10-CM | POA: Diagnosis not present

## 2019-11-16 DIAGNOSIS — Z23 Encounter for immunization: Secondary | ICD-10-CM

## 2019-11-16 DIAGNOSIS — E119 Type 2 diabetes mellitus without complications: Secondary | ICD-10-CM

## 2019-11-16 DIAGNOSIS — E538 Deficiency of other specified B group vitamins: Secondary | ICD-10-CM

## 2019-11-16 DIAGNOSIS — G4730 Sleep apnea, unspecified: Secondary | ICD-10-CM

## 2019-11-16 DIAGNOSIS — G4700 Insomnia, unspecified: Secondary | ICD-10-CM | POA: Diagnosis not present

## 2019-11-16 DIAGNOSIS — F418 Other specified anxiety disorders: Secondary | ICD-10-CM | POA: Diagnosis not present

## 2019-11-16 DIAGNOSIS — E118 Type 2 diabetes mellitus with unspecified complications: Secondary | ICD-10-CM | POA: Diagnosis not present

## 2019-11-16 DIAGNOSIS — F409 Phobic anxiety disorder, unspecified: Secondary | ICD-10-CM

## 2019-11-16 DIAGNOSIS — I10 Essential (primary) hypertension: Secondary | ICD-10-CM

## 2019-11-16 DIAGNOSIS — I480 Paroxysmal atrial fibrillation: Secondary | ICD-10-CM

## 2019-11-16 MED ORDER — APIXABAN 5 MG PO TABS: 5.0000 mg | ORAL_TABLET | Freq: Two times a day (BID) | ORAL | 1 refills | Status: DC

## 2019-11-16 MED ORDER — METFORMIN HCL 1000 MG PO TABS: ORAL_TABLET | ORAL | 1 refills | Status: DC

## 2019-11-16 MED ORDER — FREESTYLE LIBRE 14 DAY SENSOR MISC: 1.0000 | Freq: Every day | 5 refills | Status: DC

## 2019-11-16 MED ORDER — METOPROLOL SUCCINATE ER 50 MG PO TB24: 50.0000 mg | ORAL_TABLET | Freq: Every day | ORAL | 1 refills | Status: DC

## 2019-11-16 MED ORDER — ATORVASTATIN CALCIUM 40 MG PO TABS: ORAL_TABLET | ORAL | 1 refills | Status: DC

## 2019-11-16 MED ORDER — CYANOCOBALAMIN 1000 MCG/ML IJ SOLN
1000.0000 ug | Freq: Once | INTRAMUSCULAR | Status: AC
  Administered 2019-11-16: 1000 ug via INTRAMUSCULAR

## 2019-11-16 MED ORDER — FREESTYLE LIBRE 14 DAY READER DEVI: 1.0000 | Freq: Every day | 5 refills | Status: DC

## 2019-11-16 MED ORDER — HYDROCHLOROTHIAZIDE 12.5 MG PO CAPS: 12.5000 mg | ORAL_CAPSULE | Freq: Every day | ORAL | 1 refills | Status: DC

## 2019-11-16 MED ORDER — ZOLPIDEM TARTRATE 10 MG PO TABS: ORAL_TABLET | ORAL | 1 refills | Status: DC

## 2019-11-16 MED ORDER — PAROXETINE HCL ER 25 MG PO TB24: ORAL_TABLET | ORAL | 1 refills | Status: DC

## 2019-11-16 MED ORDER — AMIODARONE HCL 200 MG PO TABS: 200.0000 mg | ORAL_TABLET | Freq: Every day | ORAL | 1 refills | Status: DC

## 2019-11-16 NOTE — Patient Instructions (Signed)
Type 2 Diabetes Mellitus, Diagnosis, Adult Type 2 diabetes (type 2 diabetes mellitus) is a long-term (chronic) disease. In type 2 diabetes, one or both of these problems may be present:  The pancreas does not make enough of a hormone called insulin.  Cells in the body do not respond properly to insulin that the body makes (insulin resistance). Normally, insulin allows blood sugar (glucose) to enter cells in the body. The cells use glucose for energy. Insulin resistance or lack of insulin causes excess glucose to build up in the blood instead of going into cells. As a result, high blood glucose (hyperglycemia) develops. What increases the risk? The following factors may make you more likely to develop type 2 diabetes:  Having a family member with type 2 diabetes.  Being overweight or obese.  Having an inactive (sedentary) lifestyle.  Having been diagnosed with insulin resistance.  Having a history of prediabetes, gestational diabetes, or polycystic ovary syndrome (PCOS).  Being of American-Indian, African-American, Hispanic/Latino, or Asian/Pacific Islander descent. What are the signs or symptoms? In the early stage of this condition, you may not have symptoms. Symptoms develop slowly and may include:  Increased thirst (polydipsia).  Increased hunger(polyphagia).  Increased urination (polyuria).  Increased urination during the night (nocturia).  Unexplained weight loss.  Frequent infections that keep coming back (recurring).  Fatigue.  Weakness.  Vision changes, such as blurry vision.  Cuts or bruises that are slow to heal.  Tingling or numbness in the hands or feet.  Dark patches on the skin (acanthosis nigricans). How is this diagnosed? This condition is diagnosed based on your symptoms, your medical history, a physical exam, and your blood glucose level. Your blood glucose may be checked with one or more of the following blood tests:  A fasting blood glucose (FBG)  test. You will not be allowed to eat (you will fast) for 8 hours or longer before a blood sample is taken.  A random blood glucose test. This test checks blood glucose at any time of day regardless of when you ate.  An A1c (hemoglobin A1c) blood test. This test provides information about blood glucose control over the previous 2-3 months.  An oral glucose tolerance test (OGTT). This test measures your blood glucose at two times: ? After fasting. This is your baseline blood glucose level. ? Two hours after drinking a beverage that contains glucose. You may be diagnosed with type 2 diabetes if:  Your FBG level is 126 mg/dL (7.0 mmol/L) or higher.  Your random blood glucose level is 200 mg/dL (11.1 mmol/L) or higher.  Your A1c level is 6.5% or higher.  Your OGTT result is higher than 200 mg/dL (11.1 mmol/L). These blood tests may be repeated to confirm your diagnosis. How is this treated? Your treatment may be managed by a specialist called an endocrinologist. Type 2 diabetes may be treated by following instructions from your health care provider about:  Making diet and lifestyle changes. This may include: ? Following an individualized nutrition plan that is developed by a diet and nutrition specialist (registered dietitian). ? Exercising regularly. ? Finding ways to manage stress.  Checking your blood glucose level as often as told.  Taking diabetes medicines or insulin daily. This helps to keep your blood glucose levels in the healthy range. ? If you use insulin, you may need to adjust the dosage depending on how physically active you are and what foods you eat. Your health care provider will tell you how to adjust your dosage.    Taking medicines to help prevent complications from diabetes, such as: ? Aspirin. ? Medicine to lower cholesterol. ? Medicine to control blood pressure. Your health care provider will set individualized treatment goals for you. Your goals will be based on  your age, other medical conditions you have, and how you respond to diabetes treatment. Generally, the goal of treatment is to maintain the following blood glucose levels:  Before meals (preprandial): 80-130 mg/dL (4.4-7.2 mmol/L).  After meals (postprandial): below 180 mg/dL (10 mmol/L).  A1c level: less than 7%. Follow these instructions at home: Questions to ask your health care provider  Consider asking the following questions: ? Do I need to meet with a diabetes educator? ? Where can I find a support group for people with diabetes? ? What equipment will I need to manage my diabetes at home? ? What diabetes medicines do I need, and when should I take them? ? How often do I need to check my blood glucose? ? What number can I call if I have questions? ? When is my next appointment? General instructions  Take over-the-counter and prescription medicines only as told by your health care provider.  Keep all follow-up visits as told by your health care provider. This is important.  For more information about diabetes, visit: ? American Diabetes Association (ADA): www.diabetes.org ? American Association of Diabetes Educators (AADE): www.diabeteseducator.org Contact a health care provider if:  Your blood glucose is at or above 240 mg/dL (13.3 mmol/L) for 2 days in a row.  You have been sick or have had a fever for 2 days or longer, and you are not getting better.  You have any of the following problems for more than 6 hours: ? You cannot eat or drink. ? You have nausea and vomiting. ? You have diarrhea. Get help right away if:  Your blood glucose is lower than 54 mg/dL (3.0 mmol/L).  You become confused or you have trouble thinking clearly.  You have difficulty breathing.  You have moderate or large ketone levels in your urine. Summary  Type 2 diabetes (type 2 diabetes mellitus) is a long-term (chronic) disease. In type 2 diabetes, the pancreas does not make enough of a  hormone called insulin, or cells in the body do not respond properly to insulin that the body makes (insulin resistance).  This condition is treated by making diet and lifestyle changes and taking diabetes medicines or insulin.  Your health care provider will set individualized treatment goals for you. Your goals will be based on your age, other medical conditions you have, and how you respond to diabetes treatment.  Keep all follow-up visits as told by your health care provider. This is important. This information is not intended to replace advice given to you by your health care provider. Make sure you discuss any questions you have with your health care provider. Document Revised: 03/29/2017 Document Reviewed: 03/04/2015 Elsevier Patient Education  2020 [employer]  

## 2019-11-16 NOTE — Progress Notes (Signed)
Subjective:  Patient ID: Kirsten [name], female    DOB: August 05, 1955  Age: 64 y.o. MRN: [id number]  CC: Hypertension, Hypothyroidism, and Diabetes  This visit occurred during the SARS-CoV-2 public health emergency.  Safety protocols were in place, including screening questions prior to the visit, additional usage of staff PPE, and extensive cleaning of exam room while observing appropriate contact time as indicated for disinfecting solutions.    HPI Kirsten [name] presents for f/up - She was recently treated elsewhere for a respiratory tract infection that required a course of systemic steroids.  She says the cough has resolved and she denies chest pain, hemoptysis, wheezing, shortness of breath, fever, or chills.  Outpatient Medications Prior to Visit  Medication Sig Dispense Refill  . levothyroxine (SYNTHROID) 100 MCG tablet TAKE 1 TABLET(100 MCG) BY MOUTH DAILY 90 tablet 1  . amiodarone (PACERONE) 200 MG tablet Take 200 mg daily by mouth.    Marland Kitchen apixaban (ELIQUIS) 5 MG TABS tablet Take 1 tablet (5 mg total) by mouth 2 (two) times daily. 180 tablet 1  . atorvastatin (LIPITOR) 40 MG tablet TAKE 1 TABLET(40 MG) BY MOUTH DAILY 90 tablet 1  . hydrochlorothiazide (MICROZIDE) 12.5 MG capsule Take 1 capsule (12.5 mg total) by mouth daily. 90 capsule 1  . metFORMIN (GLUCOPHAGE) 1000 MG tablet TAKE 1 TABLET(1000 MG) BY MOUTH TWICE DAILY WITH A MEAL 180 tablet 1  . metoprolol succinate (TOPROL-XL) 50 MG 24 hr tablet Take 50 mg by mouth daily.     Marland Kitchen PARoxetine (PAXIL-CR) 25 MG 24 hr tablet TAKE 1 TABLET(25 MG) BY MOUTH DAILY 30 tablet 2  . zolpidem (AMBIEN) 10 MG tablet TAKE 1 TABLET(10 MG) BY MOUTH AT BEDTIME AS NEEDED 90 tablet 0  . albuterol (VENTOLIN HFA) 108 (90 Base) MCG/ACT inhaler Inhale 2 puffs into the lungs every 6 (six) hours as needed for wheezing or shortness of breath. (Patient not taking: Reported on 11/16/2019) 18 g 2   No facility-administered medications prior to visit.     ROS Review of Systems  Constitutional: Negative for appetite change, chills, diaphoresis, fatigue and fever.  HENT: Negative.  Negative for sinus pressure, sore throat and trouble swallowing.   Eyes: Negative.   Respiratory: Negative for cough, chest tightness, shortness of breath and wheezing.   Cardiovascular: Negative for chest pain, palpitations and leg swelling.  Gastrointestinal: Negative for abdominal pain, constipation, diarrhea, nausea and vomiting.  Endocrine: Negative.  Negative for cold intolerance, heat intolerance, polydipsia, polyphagia and polyuria.  Genitourinary: Negative.  Negative for difficulty urinating.  Musculoskeletal: Negative for arthralgias, back pain and myalgias.  Skin: Negative.  Negative for color change.  Neurological: Negative.  Negative for dizziness, weakness and light-headedness.  Hematological: Negative for adenopathy. Does not bruise/bleed easily.  Psychiatric/Behavioral: Positive for sleep disturbance. Negative for confusion, decreased concentration and dysphoric mood. The patient is nervous/anxious.     Objective:  BP 124/86   Pulse 78   Temp 98.8 F (37.1 C) (Oral)   Ht 5' 7"  (1.702 m)   Wt 209 lb (94.8 kg)   SpO2 93%   BMI 32.73 kg/m   BP Readings from Last 3 Encounters:  11/16/19 124/86  09/29/19 (!) 142/76  09/22/19 124/78    Wt Readings from Last 3 Encounters:  11/16/19 209 lb (94.8 kg)  09/29/19 212 lb (96.2 kg)  09/22/19 213 lb (96.6 kg)    Physical Exam Vitals reviewed.  Constitutional:      General: She is not in acute distress.  Appearance: She is not ill-appearing, toxic-appearing or diaphoretic.  HENT:     Mouth/Throat:     Pharynx: No oropharyngeal exudate.  Eyes:     General: No scleral icterus. Cardiovascular:     Rate and Rhythm: Normal rate and regular rhythm.     Heart sounds: No murmur heard.   Pulmonary:     Effort: Pulmonary effort is normal.     Breath sounds: No stridor. No wheezing,  rhonchi or rales.  Abdominal:     General: Abdomen is protuberant. Bowel sounds are normal. There is no distension.     Palpations: There is no hepatomegaly, splenomegaly or mass.     Tenderness: There is no abdominal tenderness.  Musculoskeletal:        General: Normal range of motion.     Cervical back: Neck supple.     Right lower leg: No edema.     Left lower leg: No edema.  Lymphadenopathy:     Cervical: No cervical adenopathy.  Skin:    General: Skin is warm and dry.     Coloration: Skin is not pale.  Neurological:     General: No focal deficit present.     Mental Status: She is alert.  Psychiatric:        Mood and Affect: Mood normal.        Behavior: Behavior normal.     Lab Results  Component Value Date   WBC 6.7 07/29/2019   HGB 13.6 07/29/2019   HCT 41 07/29/2019   PLT 319 07/29/2019   GLUCOSE 142 (H) 11/16/2019   CHOL 133 07/28/2019   TRIG 171 (A) 07/28/2019   HDL 44 07/28/2019   LDLDIRECT 63.0 01/15/2019   LDLCALC 29 07/28/2019   ALT 60 (H) 11/16/2019   AST 48 (H) 11/16/2019   NA 141 11/16/2019   K 4.0 11/16/2019   CL 101 11/16/2019   CREATININE 0.81 11/16/2019   BUN 20 11/16/2019   CO2 31 11/16/2019   TSH 3.46 11/16/2019   INR 1.4 (H) 06/02/2019   HGBA1C 7.9 (H) 11/16/2019   MICROALBUR 2.0 (H) 11/16/2019    No results found.  Assessment & Plan:   Kirsten [name] was seen today for hypertension, hypothyroidism and diabetes.  Diagnoses and all orders for this visit:  Essential hypertension, benign- Her blood pressure is adequately well controlled.  Electrolytes and renal function are normal. -     BASIC METABOLIC PANEL WITH GFR; Future -     Urinalysis, Routine w reflex microscopic; Future -     hydrochlorothiazide (MICROZIDE) 12.5 MG capsule; Take 1 capsule (12.5 mg total) by mouth daily. -     Basic Metabolic Panel (BMET) -     Urinalysis, Routine w reflex microscopic  NASH (nonalcoholic steatohepatitis)- Her LFTs remain elevated.  In addition to  lifestyle modifications I recommended that she take pioglitazone to reduce the inflammation in her liver. -     Hepatic function panel; Future -     Hepatic function panel -     pioglitazone (ACTOS) 15 MG tablet; Take 1 tablet (15 mg total) by mouth daily.  Hypothyroidism, unspecified type- Her TSH is in the normal range.  She will remain on the current dose of levothyroxine. -     TSH; Future -     TSH  Diabetes mellitus without complication ([place])- See below. -     Hemoglobin A1c; Future -     Hemoglobin A1c  Type II diabetes mellitus with manifestations ([place])- Her A1c  is up to 8.9%.  Will add pioglitazone to her current regimen. -     Urinalysis, Routine w reflex microscopic; Future -     Microalbumin / creatinine urine ratio; Future -     metFORMIN (GLUCOPHAGE) 1000 MG tablet; One po BID -     HM Diabetes Foot Exam -     Continuous Blood Gluc Receiver (FREESTYLE LIBRE 14 DAY READER) DEVI; 1 Act by Does not apply route daily. -     Continuous Blood Gluc Sensor (FREESTYLE LIBRE 14 DAY SENSOR) MISC; 1 Act by Does not apply route daily. -     Microalbumin / creatinine urine ratio -     Urinalysis, Routine w reflex microscopic -     pioglitazone (ACTOS) 15 MG tablet; Take 1 tablet (15 mg total) by mouth daily.  Paroxysmal atrial fibrillation ([place])- She is maintaining sinus rhythm.  Will continue anticoagulation with apixaban. -     amiodarone (PACERONE) 200 MG tablet; Take 1 tablet (200 mg total) by mouth daily. -     apixaban (ELIQUIS) 5 MG TABS tablet; Take 1 tablet (5 mg total) by mouth 2 (two) times daily. -     metoprolol succinate (TOPROL-XL) 50 MG 24 hr tablet; Take 1 tablet (50 mg total) by mouth daily.  Hyperlipidemia with target LDL less than 100 -     atorvastatin (LIPITOR) 40 MG tablet; One po QD  Other specified hypothyroidism  Hypothyroidism due to medication  Type 2 diabetes mellitus with complication, without long-term current use of insulin (HCC) -     metFORMIN  (GLUCOPHAGE) 1000 MG tablet; One po BID  Insomnia w/ sleep apnea -     zolpidem (AMBIEN) 10 MG tablet; TAKE 1 TABLET(10 MG) BY MOUTH AT BEDTIME AS NEEDED  Insomnia due to anxiety and fear -     zolpidem (AMBIEN) 10 MG tablet; TAKE 1 TABLET(10 MG) BY MOUTH AT BEDTIME AS NEEDED  Depression with anxiety -     PARoxetine (PAXIL-CR) 25 MG 24 hr tablet; TAKE 1 TABLET(25 MG) BY MOUTH DAILY  Vitamin B 12 deficiency -     cyanocobalamin ((VITAMIN B-12)) injection 1,000 mcg  Other orders -     Flu Vaccine QUAD 6+ mos PF IM (Fluarix Quad PF) -     Pneumococcal polysaccharide vaccine 23-valent greater than or equal to 2yo subcutaneous/IM   I have changed Kirsten [name]'s amiodarone, atorvastatin, metFORMIN, and metoprolol succinate. I am also having her start on FreeStyle Libre 14 Day Reader, FreeStyle Libre 14 Day Sensor, and pioglitazone. Additionally, I am having her maintain her albuterol, levothyroxine, apixaban, hydrochlorothiazide, PARoxetine, and zolpidem. We administered cyanocobalamin.  Meds ordered this encounter  Medications  . amiodarone (PACERONE) 200 MG tablet    Sig: Take 1 tablet (200 mg total) by mouth daily.    Dispense:  90 tablet    Refill:  1  . apixaban (ELIQUIS) 5 MG TABS tablet    Sig: Take 1 tablet (5 mg total) by mouth 2 (two) times daily.    Dispense:  180 tablet    Refill:  1  . atorvastatin (LIPITOR) 40 MG tablet    Sig: One po QD    Dispense:  90 tablet    Refill:  1  . hydrochlorothiazide (MICROZIDE) 12.5 MG capsule    Sig: Take 1 capsule (12.5 mg total) by mouth daily.    Dispense:  90 capsule    Refill:  1  . metFORMIN (GLUCOPHAGE) 1000 MG tablet  Sig: One po BID    Dispense:  180 tablet    Refill:  1  . metoprolol succinate (TOPROL-XL) 50 MG 24 hr tablet    Sig: Take 1 tablet (50 mg total) by mouth daily.    Dispense:  90 tablet    Refill:  1  . PARoxetine (PAXIL-CR) 25 MG 24 hr tablet    Sig: TAKE 1 TABLET(25 MG) BY MOUTH DAILY     Dispense:  90 tablet    Refill:  1  . zolpidem (AMBIEN) 10 MG tablet    Sig: TAKE 1 TABLET(10 MG) BY MOUTH AT BEDTIME AS NEEDED    Dispense:  90 tablet    Refill:  1  . cyanocobalamin ((VITAMIN B-12)) injection 1,000 mcg  . Continuous Blood Gluc Receiver (FREESTYLE LIBRE 14 DAY READER) DEVI    Sig: 1 Act by Does not apply route daily.    Dispense:  2 each    Refill:  5  . Continuous Blood Gluc Sensor (FREESTYLE LIBRE 14 DAY SENSOR) MISC    Sig: 1 Act by Does not apply route daily.    Dispense:  2 each    Refill:  5  . pioglitazone (ACTOS) 15 MG tablet    Sig: Take 1 tablet (15 mg total) by mouth daily.    Dispense:  90 tablet    Refill:  1   I spent 50 minutes in preparing to see the patient by review of recent labs, imaging and procedures, obtaining and reviewing separately obtained history, communicating with the patient and family or caregiver, ordering medications, tests or procedures, and documenting clinical information in the EHR including the differential Dx, treatment, and any further evaluation and other management of 1. Essential hypertension, benign 2. NASH (nonalcoholic steatohepatitis) 3. Hypothyroidism, unspecified type 4. Diabetes mellitus without complication ([place]) 5. Type II diabetes mellitus with manifestations (HCC) 6. Paroxysmal atrial fibrillation ([place]) 7. Hyperlipidemia with target LDL less than 100 8. Other specified hypothyroidism 9. Hypothyroidism due to medication 10. Type 2 diabetes mellitus with complication, without long-term current use of insulin ([place]) 11. Insomnia w/ sleep apnea 12. Insomnia due to anxiety and fear 13. Depression with anxiety 14. Vitamin B 12 deficiency     Follow-up: Return in about 6 months (around 05/16/2020).  [name], MD

## 2019-11-17 LAB — HEPATIC FUNCTION PANEL
ALT: 60 U/L — ABNORMAL HIGH (ref 0–35)
AST: 48 U/L — ABNORMAL HIGH (ref 0–37)
Albumin: 4 g/dL (ref 3.5–5.2)
Alkaline Phosphatase: 72 U/L (ref 39–117)
Bilirubin, Direct: 0 mg/dL (ref 0.0–0.3)
Total Bilirubin: 0.5 mg/dL (ref 0.2–1.2)
Total Protein: 6.6 g/dL (ref 6.0–8.3)

## 2019-11-17 LAB — MICROALBUMIN / CREATININE URINE RATIO
Creatinine,U: 134.2 mg/dL
Microalb Creat Ratio: 1.5 mg/g (ref 0.0–30.0)
Microalb, Ur: 2 mg/dL — ABNORMAL HIGH (ref 0.0–1.9)

## 2019-11-17 LAB — URINALYSIS, ROUTINE W REFLEX MICROSCOPIC
Bilirubin Urine: NEGATIVE
Hgb urine dipstick: NEGATIVE
Nitrite: NEGATIVE
Specific Gravity, Urine: >=1.03 — AB (ref 1.000–1.030)
Total Protein, Urine: NEGATIVE
Urine Glucose: NEGATIVE
Urobilinogen, UA: 0.2 (ref 0.0–1.0)
pH: 5.5 (ref 5.0–8.0)

## 2019-11-17 LAB — BASIC METABOLIC PANEL
BUN: 20 mg/dL (ref 6–23)
CO2: 31 mEq/L (ref 19–32)
Calcium: 10 mg/dL (ref 8.4–10.5)
Chloride: 101 mEq/L (ref 96–112)
Creatinine, Ser: 0.81 mg/dL (ref 0.40–1.20)
GFR: 77.68 mL/min (ref >60.00)
Glucose, Bld: 142 mg/dL — ABNORMAL HIGH (ref 70–99)
Potassium: 4 mEq/L (ref 3.5–5.1)
Sodium: 141 mEq/L (ref 135–145)

## 2019-11-17 LAB — HEMOGLOBIN A1C: Hgb A1c MFr Bld: 7.9 % — ABNORMAL HIGH (ref 4.6–6.5)

## 2019-11-17 LAB — TSH: TSH: 3.46 u[IU]/mL (ref 0.35–4.50)

## 2019-11-19 ENCOUNTER — Telehealth: Payer: Self-pay | Admitting: Gastroenterology

## 2019-11-19 ENCOUNTER — Encounter: Payer: Self-pay | Admitting: Internal Medicine

## 2019-11-19 MED ORDER — PIOGLITAZONE HCL 15 MG PO TABS: 15.0000 mg | ORAL_TABLET | Freq: Every day | ORAL | 1 refills | Status: DC

## 2019-11-19 NOTE — Telephone Encounter (Signed)
Patient called states she did not receive her prep medication her procedure is for 11/24/19

## 2019-11-20 ENCOUNTER — Telehealth: Payer: Self-pay | Admitting: Gastroenterology

## 2019-11-20 MED ORDER — SUTAB 1479-225-188 MG PO TABS: 1.0000 | ORAL_TABLET | Freq: Once | ORAL | 0 refills | Status: AC

## 2019-11-20 NOTE — Telephone Encounter (Signed)
Script sent to pharmacy. Patient informed.

## 2019-11-24 ENCOUNTER — Other Ambulatory Visit: Payer: Self-pay

## 2019-11-24 ENCOUNTER — Encounter: Payer: Self-pay | Admitting: Gastroenterology

## 2019-11-24 ENCOUNTER — Ambulatory Visit: Payer: PPO | Admitting: Gastroenterology

## 2019-11-24 VITALS — BP 119/53 | HR 67 | Temp 97.1°F | Ht 67.0 in | Wt 212.0 lb

## 2019-11-24 MED ORDER — SODIUM CHLORIDE 0.9 % IV SOLN: 500.0000 mL | Freq: Once | INTRAVENOUS | Status: DC

## 2019-11-24 NOTE — Progress Notes (Signed)
Pt's states no medical or surgical changes since previsit or office visit.  Vitals CW and Gabi  Pt was not told when to hold her Eliquis on her instructions nor on the phone when she called the office, pt took eliquis 11/23/19 in AM, per Dr Tarri Glenn we need to post-pone her colonoscopy until tomorrow. Apologized profusely to pt, she was upset but was accepting of the situation.  Per Dr Tarri Glenn, miralax prep tonight, clear liquids today, pt instructed to only drink water after midnight and npo after 6 am.  New instructions printed and reviewed with pt. Pt d/c ambulatory with husband.

## 2019-11-25 ENCOUNTER — Encounter: Payer: Self-pay | Admitting: Gastroenterology

## 2019-11-25 ENCOUNTER — Ambulatory Visit (AMBULATORY_SURGERY_CENTER): Payer: PPO | Admitting: Gastroenterology

## 2019-11-25 VITALS — BP 129/63 | HR 66 | Temp 97.1°F | Resp 26 | Ht 67.0 in | Wt 212.0 lb

## 2019-11-25 DIAGNOSIS — Z800 Family history of malignant neoplasm of digestive organs: Secondary | ICD-10-CM

## 2019-11-25 DIAGNOSIS — D122 Benign neoplasm of ascending colon: Secondary | ICD-10-CM | POA: Diagnosis not present

## 2019-11-25 DIAGNOSIS — Z1211 Encounter for screening for malignant neoplasm of colon: Secondary | ICD-10-CM | POA: Diagnosis not present

## 2019-11-25 MED ORDER — SODIUM CHLORIDE 0.9 % IV SOLN: 500.0000 mL | Freq: Once | INTRAVENOUS | Status: DC

## 2019-11-25 NOTE — Progress Notes (Signed)
Called to room to assist during endoscopic procedure.  Patient ID and intended procedure confirmed with present staff. Received instructions for my participation in the procedure from the performing physician.  

## 2019-11-25 NOTE — Patient Instructions (Signed)
Thank you for allowing [place] to care for you today.  Await final results approximately 1-2 weeks by mail. Recommendation for next colonoscoy will be made at that time.  Resume  previous medications including Eliquis today.  Resume normal activities tomorrow.    YOU HAD AN ENDOSCOPIC PROCEDURE TODAY AT [facility]:   Refer to the procedure report that was given to you for any specific questions about what was found during the examination.  If the procedure report does not answer your questions, please call your gastroenterologist to clarify.  If you requested that your care partner not be given the details of your procedure findings, then the procedure report has been included in a sealed envelope for you to review at your convenience later.  YOU SHOULD EXPECT: Some feelings of bloating in the abdomen. Passage of more gas than usual.  Walking can help get rid of the air that was put into your GI tract during the procedure and reduce the bloating. If you had a lower endoscopy (such as a colonoscopy or flexible sigmoidoscopy) you may notice spotting of blood in your stool or on the toilet paper. If you underwent a bowel prep for your procedure, you may not have a normal bowel movement for a few days.  Please Note:  You might notice some irritation and congestion in your nose or some drainage.  This is from the oxygen used during your procedure.  There is no need for concern and it should clear up in a day or so.  SYMPTOMS TO REPORT IMMEDIATELY:   Following lower endoscopy (colonoscopy or flexible sigmoidoscopy):  Excessive amounts of blood in the stool  Significant tenderness or worsening of abdominal pains  Swelling of the abdomen that is new, acute  Fever of 100F or higher   For urgent or emergent issues, a gastroenterologist can be reached at any hour by calling [phone]. Do not use MyChart messaging for urgent concerns.    DIET:  We do recommend a small meal at  first, but then you may proceed to your regular diet.  Drink plenty of fluids but you should avoid alcoholic beverages for 24 hours.  ACTIVITY:  You should plan to take it easy for the rest of today and you should NOT DRIVE or use heavy machinery until tomorrow (because of the sedation medicines used during the test).    FOLLOW UP: Our staff will call the number listed on your records 48-72 hours following your procedure to check on you and address any questions or concerns that you may have regarding the information given to you following your procedure. If we do not reach you, we will leave a message.  We will attempt to reach you two times.  During this call, we will ask if you have developed any symptoms of COVID 19. If you develop any symptoms (ie: fever, flu-like symptoms, shortness of breath, cough etc.) before then, please call [phone].  If you test positive for Covid 19 in the 2 weeks post procedure, please call and report this information to [place].    If any biopsies were taken you will be contacted by phone or by letter within the next 1-3 weeks.  Please call us at [phone] if you have not heard about the biopsies in 3 weeks.    SIGNATURES/CONFIDENTIALITY: You and/or your care partner have signed paperwork which will be entered into your electronic medical record.  These signatures attest to the fact that that the information above  on your After Visit Summary has been reviewed and is understood.  Full responsibility of the confidentiality of this discharge information lies with you and/or your care-partner.

## 2019-11-25 NOTE — Progress Notes (Signed)
VS by CW. ?

## 2019-11-25 NOTE — Progress Notes (Signed)
PT taken to PACU. Monitors in place. VSS. Report given to RN. 

## 2019-11-25 NOTE — Op Note (Signed)
[facility] Patient [name] Procedure Date: 11/25/2019 9:14 AM MRN: [id number] Endoscopist: Thornton Park MD, MD Age: 64 Referring MD:  Date of Birth: 10/12/1955 Gender: Female Account #: [id number] Procedure:                Colonoscopy Indications:              Screening for colorectal malignant neoplasm, This                            is the patient's first colonoscopy                           No known family history of colon cancer or polyps Medicines:                Monitored Anesthesia Care Procedure:                Pre-Anesthesia Assessment:                           - Prior to the procedure, a History and Physical                            was performed, and patient medications and                            allergies were reviewed. The patient's tolerance of                            previous anesthesia was also reviewed. The risks                            and benefits of the procedure and the sedation                            options and risks were discussed with the patient.                            All questions were answered, and informed consent                            was obtained. Prior Anticoagulants: The patient has                            taken Eliquis (apixaban), last dose was 2 days                            prior to procedure. ASA Grade Assessment: III - A                            patient with severe systemic disease. After                            reviewing the risks and benefits, the patient was  deemed in satisfactory condition to undergo the                            procedure.                           After obtaining informed consent, the colonoscope                            was passed under direct vision. Throughout the                            procedure, the patient's blood pressure, pulse, and                            oxygen saturations were monitored continuously. The                             Colonoscope was introduced through the anus and                            advanced to the the terminal ileum. The colonoscopy                            was performed without difficulty. The patient                            tolerated the procedure well. The quality of the                            bowel preparation was good. Scope In: 9:26:00 AM Scope Out: 9:43:22 AM Scope Withdrawal Time: 0 hours 15 minutes 43 seconds  Total Procedure Duration: 0 hours 17 minutes 22 seconds  Findings:                 The perianal and digital rectal examinations were                            normal.                           Non-bleeding internal hemorrhoids were found.                           A 2-3 mm polyp was found in the ascending colon.                            The polyp was sessile. The polyp was removed with a                            cold snare. Resection and retrieval were complete.                            Estimated blood loss was minimal.  The exam was otherwise without abnormality on                            direct and retroflexion views. Complications:            No immediate complications. Estimated blood loss:                            Minimal. Estimated Blood Loss:     Estimated blood loss was minimal. Impression:               - Non-bleeding internal hemorrhoids.                           - One 2-3 mm polyp in the ascending colon, removed                            with a cold snare. Resected and retrieved.                           - The examination was otherwise normal on direct                            and retroflexion views. Recommendation:           - Patient has a contact number available for                            emergencies. The signs and symptoms of potential                            delayed complications were discussed with the                            patient. Return to normal activities tomorrow.                             Written discharge instructions were provided to the                            patient.                           - Resume previous diet.                           - Continue present medications.                           - Resume Eliquis today.                           - Await pathology results.                           - Repeat colonoscopy date to be determined after  pending pathology results are reviewed for                            surveillance.                           - Emerging evidence supports eating a diet of                            fruits, vegetables, grains, calcium, and yogurt                            while reducing red meat and alcohol may reduce the                            risk of colon cancer.                           - Thank you for allowing me to be involved in your                            colon cancer prevention. Thornton Park MD, MD 11/25/2019 9:49:22 AM This report has been signed electronically.

## 2019-11-27 ENCOUNTER — Other Ambulatory Visit: Payer: Self-pay | Admitting: Internal Medicine

## 2019-11-27 ENCOUNTER — Telehealth: Payer: Self-pay

## 2019-11-27 NOTE — Telephone Encounter (Signed)
  Follow up Call-  Call back number 11/25/2019 11/24/2019  Post procedure Call Back phone  # [phone]  Permission to leave phone message Yes Yes  Some recent data might be hidden     Patient questions:  Do you have a fever, pain , or abdominal swelling? No. Pain Score  0 *  Have you tolerated food without any problems? Yes.    Have you been able to return to your normal activities? Yes.    Do you have any questions about your discharge instructions: Diet   No. Medications  No. Follow up visit  No.  Do you have questions or concerns about your Care? No.  Actions: * If pain score is 4 or above: No action needed, pain <4.  1. Have you developed a fever since your procedure? no  2.   Have you had an respiratory symptoms (SOB or cough) since your procedure? no  3.   Have you tested positive for COVID 19 since your procedure no  4.   Have you had any family members/close contacts diagnosed with the COVID 19 since your procedure?  no   If yes to any of these questions please route to Joylene John, RN and Joella Prince, RN

## 2019-12-02 ENCOUNTER — Encounter: Payer: Self-pay | Admitting: Gastroenterology

## 2019-12-02 DIAGNOSIS — M533 Sacrococcygeal disorders, not elsewhere classified: Secondary | ICD-10-CM | POA: Diagnosis not present

## 2019-12-22 DIAGNOSIS — G4733 Obstructive sleep apnea (adult) (pediatric): Secondary | ICD-10-CM | POA: Diagnosis not present

## 2019-12-29 ENCOUNTER — Telehealth: Payer: Self-pay | Admitting: Internal Medicine

## 2019-12-29 NOTE — Telephone Encounter (Signed)
LVM for pt to rtn my call to schedule AWV-I with [facility]. Please schedule this appt if pt calls the office.    Thanks [phone]

## 2019-12-31 ENCOUNTER — Ambulatory Visit (INDEPENDENT_AMBULATORY_CARE_PROVIDER_SITE_OTHER): Payer: PPO | Admitting: Internal Medicine

## 2019-12-31 ENCOUNTER — Other Ambulatory Visit: Payer: Self-pay

## 2019-12-31 ENCOUNTER — Encounter: Payer: Self-pay | Admitting: Internal Medicine

## 2019-12-31 VITALS — BP 128/76 | HR 71 | Temp 98.1°F | Ht 67.0 in | Wt 210.0 lb

## 2019-12-31 DIAGNOSIS — M48062 Spinal stenosis, lumbar region with neurogenic claudication: Secondary | ICD-10-CM | POA: Diagnosis not present

## 2019-12-31 DIAGNOSIS — I10 Essential (primary) hypertension: Secondary | ICD-10-CM | POA: Diagnosis not present

## 2019-12-31 DIAGNOSIS — F409 Phobic anxiety disorder, unspecified: Secondary | ICD-10-CM

## 2019-12-31 DIAGNOSIS — G4700 Insomnia, unspecified: Secondary | ICD-10-CM | POA: Diagnosis not present

## 2019-12-31 DIAGNOSIS — E032 Hypothyroidism due to medicaments and other exogenous substances: Secondary | ICD-10-CM | POA: Diagnosis not present

## 2019-12-31 DIAGNOSIS — E038 Other specified hypothyroidism: Secondary | ICD-10-CM

## 2019-12-31 DIAGNOSIS — E118 Type 2 diabetes mellitus with unspecified complications: Secondary | ICD-10-CM | POA: Diagnosis not present

## 2019-12-31 DIAGNOSIS — G4730 Sleep apnea, unspecified: Secondary | ICD-10-CM | POA: Diagnosis not present

## 2019-12-31 DIAGNOSIS — F418 Other specified anxiety disorders: Secondary | ICD-10-CM

## 2019-12-31 DIAGNOSIS — F5105 Insomnia due to other mental disorder: Secondary | ICD-10-CM

## 2019-12-31 MED ORDER — METFORMIN HCL 1000 MG PO TABS: ORAL_TABLET | ORAL | 1 refills | Status: DC

## 2019-12-31 MED ORDER — HYDROCHLOROTHIAZIDE 12.5 MG PO CAPS: 12.5000 mg | ORAL_CAPSULE | Freq: Every day | ORAL | 1 refills | Status: DC

## 2019-12-31 MED ORDER — LEVOTHYROXINE SODIUM 100 MCG PO TABS: 100.0000 ug | ORAL_TABLET | Freq: Every day | ORAL | 1 refills | Status: DC

## 2019-12-31 MED ORDER — ZOLPIDEM TARTRATE 10 MG PO TABS: ORAL_TABLET | ORAL | 1 refills | Status: DC

## 2019-12-31 MED ORDER — PAROXETINE HCL ER 25 MG PO TB24: ORAL_TABLET | ORAL | 1 refills | Status: DC

## 2019-12-31 NOTE — Progress Notes (Signed)
Subjective:  Patient ID: Kirsten [name], female    DOB: October 25, 1955  Age: 64 y.o. MRN: [id number]  CC: Back Pain, Hypertension, Hypothyroidism, and Diabetes  This visit occurred during the SARS-CoV-2 public health emergency.  Safety protocols were in place, including screening questions prior to the visit, additional usage of staff PPE, and extensive cleaning of exam room while observing appropriate contact time as indicated for disinfecting solutions.    HPI Kirsten [name] presents for f/up - She complains that over the last 2 weeks she has had several falls and injuries to her lower legs.  She has a history of low back pain and tells me she recently saw an orthopedist and had some injections into the sacrum which helped with the back pain but she tells me an MRI revealed spinal stenosis and disc herniations.  She does not have much back pain but she has aching in both thighs.  Her legs and feet feel weak and uncoordinated.  She denies numbness or tingling.  Outpatient Medications Prior to Visit  Medication Sig Dispense Refill  . albuterol (VENTOLIN HFA) 108 (90 Base) MCG/ACT inhaler Inhale 2 puffs into the lungs every 6 (six) hours as needed for wheezing or shortness of breath. 18 g 2  . amiodarone (PACERONE) 200 MG tablet Take 1 tablet (200 mg total) by mouth daily. 90 tablet 1  . apixaban (ELIQUIS) 5 MG TABS tablet Take 1 tablet (5 mg total) by mouth 2 (two) times daily. 180 tablet 1  . atorvastatin (LIPITOR) 40 MG tablet One po QD 90 tablet 1  . Continuous Blood Gluc Receiver (FREESTYLE LIBRE 14 DAY READER) DEVI 1 Act by Does not apply route daily. 2 each 5  . Continuous Blood Gluc Sensor (FREESTYLE LIBRE 14 DAY SENSOR) MISC 1 Act by Does not apply route daily. 2 each 5  . metoprolol succinate (TOPROL-XL) 50 MG 24 hr tablet Take 1 tablet (50 mg total) by mouth daily. 90 tablet 1  . pioglitazone (ACTOS) 15 MG tablet Take 1 tablet (15 mg total) by mouth daily. 90 tablet 1  .  hydrochlorothiazide (MICROZIDE) 12.5 MG capsule Take 1 capsule (12.5 mg total) by mouth daily. 90 capsule 1  . levothyroxine (SYNTHROID) 100 MCG tablet TAKE 1 TABLET(100 MCG) BY MOUTH DAILY 90 tablet 1  . metFORMIN (GLUCOPHAGE) 1000 MG tablet One po BID 180 tablet 1  . PARoxetine (PAXIL-CR) 25 MG 24 hr tablet TAKE 1 TABLET(25 MG) BY MOUTH DAILY 90 tablet 1  . zolpidem (AMBIEN) 10 MG tablet TAKE 1 TABLET(10 MG) BY MOUTH AT BEDTIME AS NEEDED 90 tablet 1  . 0.9 %  sodium chloride infusion      No facility-administered medications prior to visit.    ROS Review of Systems  Constitutional: Negative.  Negative for chills, fatigue and fever.  HENT: Negative.   Eyes: Negative.   Respiratory: Negative for cough, chest tightness, shortness of breath and wheezing.   Cardiovascular: Negative for chest pain, palpitations and leg swelling.  Gastrointestinal: Negative for abdominal pain, diarrhea, nausea and vomiting.  Endocrine: Negative.   Genitourinary: Negative.  Negative for difficulty urinating.  Musculoskeletal: Positive for back pain and gait problem. Negative for arthralgias.  Skin: Negative.  Negative for color change.  Neurological: Positive for weakness. Negative for dizziness, light-headedness and numbness.  Hematological: Negative for adenopathy. Does not bruise/bleed easily.  Psychiatric/Behavioral: Positive for dysphoric mood and sleep disturbance. Negative for behavioral problems, confusion, self-injury and suicidal ideas. The patient is nervous/anxious.  Objective:  BP 128/76   Pulse 71   Temp 98.1 F (36.7 C) (Oral)   Ht 5' 7"  (1.702 m)   Wt 210 lb (95.3 kg)   SpO2 97%   BMI 32.89 kg/m   BP Readings from Last 3 Encounters:  12/31/19 128/76  11/25/19 129/63  11/24/19 (!) 119/53    Wt Readings from Last 3 Encounters:  12/31/19 210 lb (95.3 kg)  11/25/19 212 lb (96.2 kg)  11/24/19 212 lb (96.2 kg)    Physical Exam Vitals reviewed.  HENT:     Nose: Nose normal.       Mouth/Throat:     Mouth: Mucous membranes are moist.  Eyes:     General: No scleral icterus.    Conjunctiva/sclera: Conjunctivae normal.  Cardiovascular:     Rate and Rhythm: Normal rate and regular rhythm.     Heart sounds: No murmur heard.   Pulmonary:     Effort: Pulmonary effort is normal.     Breath sounds: No stridor. No wheezing, rhonchi or rales.  Abdominal:     General: Abdomen is protuberant. Bowel sounds are normal. There is no distension.     Palpations: Abdomen is soft. There is no hepatomegaly, splenomegaly or mass.     Tenderness: There is no abdominal tenderness.  Musculoskeletal:        General: No swelling. Normal range of motion.     Cervical back: Neck supple.     Right lower leg: Swelling and tenderness present. No deformity, lacerations or bony tenderness. No edema.     Left lower leg: Swelling and tenderness present. No deformity, lacerations or bony tenderness. No edema.     Comments: There are scattered ecchymoses with mild swelling and tenderness to palpation over bilateral lower legs.  Lymphadenopathy:     Cervical: No cervical adenopathy.  Skin:    General: Skin is warm and dry.     Coloration: Skin is not pale.  Neurological:     General: No focal deficit present.     Mental Status: She is alert and oriented to person, place, and time.     Cranial Nerves: Cranial nerves are intact. No cranial nerve deficit.     Sensory: Sensation is intact. No sensory deficit.     Motor: Weakness present. No tremor, atrophy or abnormal muscle tone.     Coordination: Romberg sign negative. Coordination normal. Heel to Shin Test abnormal. Finger-Nose-Finger Test normal. Rapid alternating movements normal.     Gait: Gait normal.     Deep Tendon Reflexes: Reflexes normal.     Reflex Scores:      Tricep reflexes are 1+ on the right side and 1+ on the left side.      Bicep reflexes are 1+ on the right side and 1+ on the left side.      Brachioradialis reflexes are 1+  on the right side and 1+ on the left side.      Patellar reflexes are 2+ on the right side and 2+ on the left side.      Achilles reflexes are 1+ on the right side and 2+ on the left side.    Comments: Mild symm weakness in BLE  Psychiatric:        Mood and Affect: Mood normal.        Behavior: Behavior normal.        Thought Content: Thought content normal.        Judgment: Judgment normal.  Lab Results  Component Value Date   WBC 6.7 07/29/2019   HGB 13.6 07/29/2019   HCT 41 07/29/2019   PLT 319 07/29/2019   GLUCOSE 142 (H) 11/16/2019   CHOL 133 07/28/2019   TRIG 171 (A) 07/28/2019   HDL 44 07/28/2019   LDLDIRECT 63.0 01/15/2019   LDLCALC 29 07/28/2019   ALT 60 (H) 11/16/2019   AST 48 (H) 11/16/2019   NA 141 11/16/2019   K 4.0 11/16/2019   CL 101 11/16/2019   CREATININE 0.81 11/16/2019   BUN 20 11/16/2019   CO2 31 11/16/2019   TSH 3.46 11/16/2019   INR 1.4 (H) 06/02/2019   HGBA1C 7.9 (H) 11/16/2019   MICROALBUR 2.0 (H) 11/16/2019    No results found.  Assessment & Plan:   Kirsten [name] was seen today for back pain, hypertension, hypothyroidism and diabetes.  Diagnoses and all orders for this visit:  Spinal stenosis, lumbar region with neurogenic claudication- I recommended that she see a neurosurgeon about this. -     Ambulatory referral to Neurosurgery -     Ambulatory referral to Physical Therapy  Essential hypertension, benign- Her blood pressure is adequately well controlled. -     hydrochlorothiazide (MICROZIDE) 12.5 MG capsule; Take 1 capsule (12.5 mg total) by mouth daily.  Other specified hypothyroidism -     levothyroxine (SYNTHROID) 100 MCG tablet; Take 1 tablet (100 mcg total) by mouth daily before breakfast.  Hypothyroidism due to medication- Her recent TSH was in the normal range.  She will stay on the current dose of levothyroxine. -     levothyroxine (SYNTHROID) 100 MCG tablet; Take 1 tablet (100 mcg total) by mouth daily before breakfast.  Type  II diabetes mellitus with manifestations (HCC) -     metFORMIN (GLUCOPHAGE) 1000 MG tablet; One po BID  Type 2 diabetes mellitus with complication, without long-term current use of insulin ([place])- Her last A1c was 7.9%.  I recommended that she add a GLP-1 agonist to Metformin and pioglitazone. -     metFORMIN (GLUCOPHAGE) 1000 MG tablet; One po BID  Depression with anxiety -     PARoxetine (PAXIL-CR) 25 MG 24 hr tablet; TAKE 1 TABLET(25 MG) BY MOUTH DAILY  Insomnia w/ sleep apnea -     zolpidem (AMBIEN) 10 MG tablet; TAKE 1 TABLET(10 MG) BY MOUTH AT BEDTIME AS NEEDED  Insomnia due to anxiety and fear -     zolpidem (AMBIEN) 10 MG tablet; TAKE 1 TABLET(10 MG) BY MOUTH AT BEDTIME AS NEEDED   I have changed Kirsten [name]'s levothyroxine. I am also having her start on Rybelsus. Additionally, I am having her maintain her albuterol, amiodarone, apixaban, atorvastatin, metoprolol succinate, FreeStyle Libre 14 Day Reader, FreeStyle Libre 14 Day Sensor, pioglitazone, hydrochlorothiazide, metFORMIN, PARoxetine, and zolpidem. We will stop administering sodium chloride.  Meds ordered this encounter  Medications  . hydrochlorothiazide (MICROZIDE) 12.5 MG capsule    Sig: Take 1 capsule (12.5 mg total) by mouth daily.    Dispense:  90 capsule    Refill:  1  . levothyroxine (SYNTHROID) 100 MCG tablet    Sig: Take 1 tablet (100 mcg total) by mouth daily before breakfast.    Dispense:  90 tablet    Refill:  1  . metFORMIN (GLUCOPHAGE) 1000 MG tablet    Sig: One po BID    Dispense:  180 tablet    Refill:  1  . PARoxetine (PAXIL-CR) 25 MG 24 hr tablet    Sig: TAKE 1  TABLET(25 MG) BY MOUTH DAILY    Dispense:  90 tablet    Refill:  1  . zolpidem (AMBIEN) 10 MG tablet    Sig: TAKE 1 TABLET(10 MG) BY MOUTH AT BEDTIME AS NEEDED    Dispense:  90 tablet    Refill:  1  . Semaglutide (RYBELSUS) 3 MG TABS    Sig: Take 1 tablet by mouth daily.    Dispense:  30 tablet    Refill:  0   I spent 50  minutes in preparing to see the patient by review of recent labs, imaging and procedures, obtaining and reviewing separately obtained history, communicating with the patient and family or caregiver, ordering medications, tests or procedures, and documenting clinical information in the EHR including the differential Dx, treatment, and any further evaluation and other management of 1. Essential hypertension, benign 2. Other specified hypothyroidism 3. Hypothyroidism due to medication 4. Type II diabetes mellitus with manifestations ([place]) 5. Type 2 diabetes mellitus with complication, without long-term current use of insulin ([place]) 6. Depression with anxiety 7. Insomnia w/ sleep apnea 8. Insomnia due to anxiety and fear 9. Spinal stenosis, lumbar region with neurogenic claudication     Follow-up: No follow-ups on file.  Kirsten Calico, MD

## 2020-01-01 ENCOUNTER — Encounter: Payer: Self-pay | Admitting: Internal Medicine

## 2020-01-01 DIAGNOSIS — G4733 Obstructive sleep apnea (adult) (pediatric): Secondary | ICD-10-CM | POA: Diagnosis not present

## 2020-01-01 MED ORDER — RYBELSUS 3 MG PO TABS: 1.0000 | ORAL_TABLET | Freq: Every day | ORAL | 0 refills | Status: DC

## 2020-01-01 NOTE — Patient Instructions (Signed)

## 2020-01-08 ENCOUNTER — Other Ambulatory Visit: Payer: Self-pay | Admitting: Internal Medicine

## 2020-01-08 DIAGNOSIS — E785 Hyperlipidemia, unspecified: Secondary | ICD-10-CM

## 2020-01-11 DIAGNOSIS — M6281 Muscle weakness (generalized): Secondary | ICD-10-CM | POA: Diagnosis not present

## 2020-01-11 DIAGNOSIS — R292 Abnormal reflex: Secondary | ICD-10-CM | POA: Diagnosis not present

## 2020-01-21 DIAGNOSIS — I351 Nonrheumatic aortic (valve) insufficiency: Secondary | ICD-10-CM | POA: Diagnosis not present

## 2020-01-21 DIAGNOSIS — I4891 Unspecified atrial fibrillation: Secondary | ICD-10-CM | POA: Diagnosis not present

## 2020-01-21 DIAGNOSIS — I10 Essential (primary) hypertension: Secondary | ICD-10-CM | POA: Diagnosis not present

## 2020-01-21 DIAGNOSIS — I2510 Atherosclerotic heart disease of native coronary artery without angina pectoris: Secondary | ICD-10-CM | POA: Diagnosis not present

## 2020-01-21 DIAGNOSIS — R0602 Shortness of breath: Secondary | ICD-10-CM | POA: Diagnosis not present

## 2020-01-28 DIAGNOSIS — M4802 Spinal stenosis, cervical region: Secondary | ICD-10-CM | POA: Diagnosis not present

## 2020-01-28 DIAGNOSIS — M4804 Spinal stenosis, thoracic region: Secondary | ICD-10-CM | POA: Diagnosis not present

## 2020-01-28 DIAGNOSIS — M5124 Other intervertebral disc displacement, thoracic region: Secondary | ICD-10-CM | POA: Diagnosis not present

## 2020-01-28 DIAGNOSIS — M50322 Other cervical disc degeneration at C5-C6 level: Secondary | ICD-10-CM | POA: Diagnosis not present

## 2020-01-28 DIAGNOSIS — M6281 Muscle weakness (generalized): Secondary | ICD-10-CM | POA: Diagnosis not present

## 2020-01-28 DIAGNOSIS — R292 Abnormal reflex: Secondary | ICD-10-CM | POA: Diagnosis not present

## 2020-01-28 DIAGNOSIS — M50221 Other cervical disc displacement at C4-C5 level: Secondary | ICD-10-CM | POA: Diagnosis not present

## 2020-02-01 DIAGNOSIS — M6281 Muscle weakness (generalized): Secondary | ICD-10-CM | POA: Diagnosis not present

## 2020-02-10 ENCOUNTER — Encounter: Payer: Self-pay | Admitting: Internal Medicine

## 2020-02-11 ENCOUNTER — Other Ambulatory Visit: Payer: PPO

## 2020-02-11 DIAGNOSIS — Z20822 Contact with and (suspected) exposure to covid-19: Secondary | ICD-10-CM | POA: Diagnosis not present

## 2020-02-12 ENCOUNTER — Emergency Department: Payer: PPO

## 2020-02-12 ENCOUNTER — Emergency Department
Admission: EM | Admit: 2020-02-12 | Discharge: 2020-02-12 | Disposition: A | Discharge status: Home / Self Care | Payer: PPO | Attending: Emergency Medicine | Admitting: Emergency Medicine

## 2020-02-12 ENCOUNTER — Other Ambulatory Visit: Payer: Self-pay

## 2020-02-12 DIAGNOSIS — K5732 Diverticulitis of large intestine without perforation or abscess without bleeding: Secondary | ICD-10-CM | POA: Diagnosis not present

## 2020-02-12 DIAGNOSIS — Z7984 Long term (current) use of oral hypoglycemic drugs: Secondary | ICD-10-CM | POA: Insufficient documentation

## 2020-02-12 DIAGNOSIS — E039 Hypothyroidism, unspecified: Secondary | ICD-10-CM | POA: Insufficient documentation

## 2020-02-12 DIAGNOSIS — R0789 Other chest pain: Secondary | ICD-10-CM

## 2020-02-12 DIAGNOSIS — Z79899 Other long term (current) drug therapy: Secondary | ICD-10-CM | POA: Insufficient documentation

## 2020-02-12 DIAGNOSIS — Z20822 Contact with and (suspected) exposure to covid-19: Secondary | ICD-10-CM | POA: Insufficient documentation

## 2020-02-12 DIAGNOSIS — J4530 Mild persistent asthma, uncomplicated: Secondary | ICD-10-CM | POA: Insufficient documentation

## 2020-02-12 DIAGNOSIS — K529 Noninfective gastroenteritis and colitis, unspecified: Secondary | ICD-10-CM | POA: Diagnosis not present

## 2020-02-12 DIAGNOSIS — I10 Essential (primary) hypertension: Secondary | ICD-10-CM | POA: Diagnosis not present

## 2020-02-12 DIAGNOSIS — K5792 Diverticulitis of intestine, part unspecified, without perforation or abscess without bleeding: Secondary | ICD-10-CM | POA: Diagnosis not present

## 2020-02-12 DIAGNOSIS — Z7901 Long term (current) use of anticoagulants: Secondary | ICD-10-CM | POA: Diagnosis not present

## 2020-02-12 DIAGNOSIS — J8410 Pulmonary fibrosis, unspecified: Secondary | ICD-10-CM | POA: Diagnosis not present

## 2020-02-12 DIAGNOSIS — E1169 Type 2 diabetes mellitus with other specified complication: Secondary | ICD-10-CM | POA: Diagnosis not present

## 2020-02-12 DIAGNOSIS — M549 Dorsalgia, unspecified: Secondary | ICD-10-CM | POA: Diagnosis not present

## 2020-02-12 DIAGNOSIS — I4891 Unspecified atrial fibrillation: Secondary | ICD-10-CM | POA: Diagnosis not present

## 2020-02-12 DIAGNOSIS — J9811 Atelectasis: Secondary | ICD-10-CM | POA: Diagnosis not present

## 2020-02-12 DIAGNOSIS — K439 Ventral hernia without obstruction or gangrene: Secondary | ICD-10-CM | POA: Diagnosis not present

## 2020-02-12 DIAGNOSIS — I2510 Atherosclerotic heart disease of native coronary artery without angina pectoris: Secondary | ICD-10-CM | POA: Diagnosis not present

## 2020-02-12 LAB — CBC
HCT: 43.7 % (ref 36.0–46.0)
Hemoglobin: 14.5 g/dL (ref 12.0–15.0)
MCH: 30.1 pg (ref 26.0–34.0)
MCHC: 33.2 g/dL (ref 30.0–36.0)
MCV: 90.9 fL (ref 80.0–100.0)
Platelets: 291 10*3/uL (ref 150–400)
RBC: 4.81 MIL/uL (ref 3.87–5.11)
RDW: 12.8 % (ref 11.5–15.5)
WBC: 8.7 10*3/uL (ref 4.0–10.5)
nRBC: 0 % (ref 0.0–0.2)

## 2020-02-12 LAB — BASIC METABOLIC PANEL
Anion gap: 12 (ref 5–15)
BUN: 11 mg/dL (ref 8–23)
CO2: 27 mmol/L (ref 22–32)
Calcium: 9.9 mg/dL (ref 8.9–10.3)
Chloride: 100 mmol/L (ref 98–111)
Creatinine, Ser: 0.7 mg/dL (ref 0.44–1.00)
GFR, Estimated: >60 mL/min (ref >60)
Glucose, Bld: 216 mg/dL — ABNORMAL HIGH (ref 70–99)
Potassium: 3.5 mmol/L (ref 3.5–5.1)
Sodium: 139 mmol/L (ref 135–145)

## 2020-02-12 LAB — TROPONIN I (HIGH SENSITIVITY)
Troponin I (High Sensitivity): 13 ng/L (ref <18)
Troponin I (High Sensitivity): 17 ng/L (ref <18)

## 2020-02-12 LAB — POC SARS CORONAVIRUS 2 AG -  ED
SARS Coronavirus 2 Ag: NEGATIVE
SARS Coronavirus 2 Ag: NEGATIVE

## 2020-02-12 MED ORDER — METOPROLOL TARTRATE 5 MG/5ML IV SOLN
5.0000 mg | Freq: Once | INTRAVENOUS | Status: AC
  Administered 2020-02-12: 5 mg via INTRAVENOUS
  Filled 2020-02-12: qty 5

## 2020-02-12 MED ORDER — ONDANSETRON 8 MG PO TBDP: 8.0000 mg | ORAL_TABLET | Freq: Three times a day (TID) | ORAL | 0 refills | Status: DC | PRN

## 2020-02-12 MED ORDER — CIPROFLOXACIN HCL 500 MG PO TABS
500.0000 mg | ORAL_TABLET | Freq: Once | ORAL | Status: AC
  Administered 2020-02-12: 500 mg via ORAL
  Filled 2020-02-12: qty 1

## 2020-02-12 MED ORDER — ONDANSETRON HCL 4 MG/2ML IJ SOLN
4.0000 mg | Freq: Once | INTRAMUSCULAR | Status: AC
  Administered 2020-02-12: 4 mg via INTRAVENOUS
  Filled 2020-02-12: qty 2

## 2020-02-12 MED ORDER — METRONIDAZOLE 500 MG PO TABS: 500.0000 mg | ORAL_TABLET | Freq: Three times a day (TID) | ORAL | 0 refills | Status: AC

## 2020-02-12 MED ORDER — IOHEXOL 350 MG/ML SOLN
100.0000 mL | Freq: Once | INTRAVENOUS | Status: AC | PRN
  Administered 2020-02-12: 100 mL via INTRAVENOUS

## 2020-02-12 MED ORDER — METOPROLOL TARTRATE 25 MG PO TABS
25.0000 mg | ORAL_TABLET | Freq: Once | ORAL | Status: AC
  Administered 2020-02-12: 25 mg via ORAL
  Filled 2020-02-12: qty 1

## 2020-02-12 MED ORDER — CIPROFLOXACIN HCL 500 MG PO TABS: 500.0000 mg | ORAL_TABLET | Freq: Two times a day (BID) | ORAL | 0 refills | Status: AC

## 2020-02-12 MED ORDER — SODIUM CHLORIDE 0.9 % IV BOLUS
500.0000 mL | Freq: Once | INTRAVENOUS | Status: AC
  Administered 2020-02-12: 500 mL via INTRAVENOUS

## 2020-02-12 MED ORDER — METRONIDAZOLE 500 MG PO TABS
500.0000 mg | ORAL_TABLET | Freq: Once | ORAL | Status: AC
  Administered 2020-02-12: 500 mg via ORAL
  Filled 2020-02-12: qty 1

## 2020-02-12 NOTE — ED Provider Notes (Signed)
[facility] Emergency Department Provider Note ____________________________________________   Event Date/Time   First MD Initiated Contact with Patient 02/12/20 1224     (approximate)  I have reviewed the triage vital signs and the nursing notes.   HISTORY  Chief Complaint Chest Pain    HPI Kirsten [name] is a 64 y.o. female with PMH as noted below including atrial fibrillation on Eliquis and metoprolol who presents with chest discomfort, acute onset this morning when she was doing laundry, described as a feeling of her heart beating fast and hard along with pressure and tightness in the chest.  She reports a bandlike pressure feeling in her upper back as well.  She states that whenever she would exert herself, she would feel lightheaded and somewhat short of breath.  She denies any prior history of similar symptoms.  She called her cardiologist who advised her to take an extra dose of metoprolol.  This did not relieve the symptoms.  She reports a recent Covid exposure in her family but has no cough or fever.  Past Medical History:  Diagnosis Date  . A-fib ([place])   . Anxiety disorder   . Asthma   . Dyspnea   . Gastroschisis    umphalocele, rotated organs abd repair until age 70  . GERD (gastroesophageal reflux disease)   . HTN (hypertension)   . Hyperlipidemia   . IBS (irritable bowel syndrome)   . Morbid obesity ([place])    Target wt - 185  for BMI < 30  . Obesity   . OSA on CPAP   . Pulmonary fibrosis ([place])   . SBO (small bowel obstruction) ([place])    Resolved with NG/Bowel rest around 2009  . Sleep apnea   . Type II or unspecified type diabetes mellitus without mention of complication, not stated as uncontrolled     Patient Active Problem List   Diagnosis Date Noted  . Spinal stenosis, lumbar region with neurogenic claudication 12/31/2019  . DDD (degenerative disc disease), lumbar 08/11/2019  . Primary osteoarthritis of right hip 08/11/2019  .  Urinary urgency 08/10/2019  . NASH (nonalcoholic steatohepatitis) 06/02/2019  . Diabetes mellitus without complication ([place]) 45/40/9811  . Asthma exacerbation 05/23/2019  . Colon cancer screening 12/25/2017  . Estrogen deficiency 12/25/2017  . Pulmonary fibrosis ([place])   . 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 Insomnia w/ sleep apnea 03/21/2017  . Class 1 obesity with serious comorbidity and body mass index (BMI) of 34.0 to 34.9 in adult 01/31/2016  . Vaginal atrophy 01/13/2015  . Insomnia due to anxiety and fear 10/11/2014  . GAD (generalized anxiety disorder) 10/11/2014  . Paroxysmal atrial fibrillation ([place]) 04/08/2014  . Hyperlipidemia with target LDL less than 100 10/22/2012  . Asthma, mild persistent 04/06/2011  . Hypertriglyceridemia 01/29/2011  . Vitamin B 12 deficiency 06/28/2009  . Hypothyroidism 06/20/2009  . Depression with anxiety 06/20/2009  . Type II diabetes mellitus with manifestations ([place]) 04/08/2009  . Essential hypertension, benign 04/08/2009  . OSA (obstructive sleep apnea) 04/08/2009    Past Surgical History:  Procedure Laterality Date  . BREAST EXCISIONAL BIOPSY Left 03/19/2012   neg  . CESAREAN SECTION    . CHOLECYSTECTOMY  1992  . COLONOSCOPY  2011   2011-normal  . Newborn Surgery - GI - ORGANS OUTSIDE ABDOMEN    . Small Bowel Repair    . TUBAL LIGATION  1988    Prior to Admission medications   Medication Sig Start Date  End Date Taking? Authorizing Provider  ciprofloxacin (CIPRO) 500 MG tablet Take 1 tablet (500 mg total) by mouth 2 (two) times daily for 7 days. 02/12/20 02/19/20 Yes Arta Silence, MD  metroNIDAZOLE (FLAGYL) 500 MG tablet Take 1 tablet (500 mg total) by mouth 3 (three) times daily for 7 days. 02/12/20 02/19/20 Yes Arta Silence, MD  ondansetron (ZOFRAN ODT) 8 MG disintegrating tablet Take 1 tablet (8 mg total) by mouth every 8 (eight) hours as needed for nausea or vomiting. 02/12/20  Yes Arta Silence, MD   albuterol (VENTOLIN HFA) 108 (90 Base) MCG/ACT inhaler Inhale 2 puffs into the lungs every 6 (six) hours as needed for wheezing or shortness of breath. 02/11/19   Biagio Borg, MD  amiodarone (PACERONE) 200 MG tablet Take 1 tablet (200 mg total) by mouth daily. 11/16/19   [name], MD  apixaban (ELIQUIS) 5 MG TABS tablet Take 1 tablet (5 mg total) by mouth 2 (two) times daily. 11/16/19   [name], MD  atorvastatin (LIPITOR) 40 MG tablet TAKE 1 TABLET(40 MG) BY MOUTH DAILY 01/08/20   [name], MD  Continuous Blood Gluc Receiver (FREESTYLE LIBRE 14 DAY READER) DEVI 1 Act by Does not apply route daily. 11/16/19   [name], MD  Continuous Blood Gluc Sensor (FREESTYLE LIBRE 14 DAY SENSOR) MISC 1 Act by Does not apply route daily. 11/16/19   [name], MD  hydrochlorothiazide (MICROZIDE) 12.5 MG capsule Take 1 capsule (12.5 mg total) by mouth daily. 12/31/19   [name], MD  levothyroxine (SYNTHROID) 100 MCG tablet Take 1 tablet (100 mcg total) by mouth daily before breakfast. 12/31/19   [name], MD  metFORMIN (GLUCOPHAGE) 1000 MG tablet One po BID 12/31/19   [name], MD  metoprolol succinate (TOPROL-XL) 50 MG 24 hr tablet Take 1 tablet (50 mg total) by mouth daily. 11/16/19   [name], MD  PARoxetine (PAXIL-CR) 25 MG 24 hr tablet TAKE 1 TABLET(25 MG) BY MOUTH DAILY 12/31/19   [name], MD  pioglitazone (ACTOS) 15 MG tablet Take 1 tablet (15 mg total) by mouth daily. 11/19/19   [name], MD  Semaglutide (RYBELSUS) 3 MG TABS Take 1 tablet by mouth daily. 01/01/20   [name], MD  zolpidem (AMBIEN) 10 MG tablet TAKE 1 TABLET(10 MG) BY MOUTH AT BEDTIME AS NEEDED 12/31/19   [name], MD    Allergies Factive [gemifloxacin mesylate], Crestor [rosuvastatin], Sulfonamide derivatives, Gemifloxacin, and Penicillins  Family History  Problem Relation Age of Onset  . Lymphoma Mother   . Diabetes type II Father   . Colon cancer  Father   . Diabetes type I Son   . Diabetes Maternal Grandmother   . Goiter Neg Hx   . Breast cancer Neg Hx   . Ovarian cancer Neg Hx     Social History Social History   Tobacco Use  . Smoking status: Never Smoker  . Smokeless tobacco: Never Used  . Tobacco comment: "tried as a teen"  Vaping Use  . Vaping Use: Never used  Substance Use Topics  . Alcohol use: Yes    Alcohol/week: 2.0 standard drinks    Types: 2 Glasses of wine per week    Comment: rare  . Drug use: No    Review of Systems  Constitutional: No fever. Eyes: No redness. ENT: No sore throat. Cardiovascular: Positive for chest discomfort and palpitations. Respiratory: Positive for shortness of breath.  Gastrointestinal: No vomiting or diarrhea.  Genitourinary: Negative for dysuria.  Musculoskeletal: Positive for back pain. Skin: Negative for rash. Neurological: Negative for headache.   ____________________________________________   PHYSICAL EXAM:  VITAL SIGNS: ED Triage Vitals  Enc Vitals Group     BP 02/12/20 1205 (!) 144/75     Pulse Rate 02/12/20 1205 70     Resp 02/12/20 1205 18     Temp 02/12/20 1205 98.2 F (36.8 C)     Temp Source 02/12/20 1205 Oral     SpO2 02/12/20 1205 95 %     Weight 02/12/20 1205 210 lb (95.3 kg)     Height 02/12/20 1205 5' 7"  (1.702 m)     Head Circumference --      Peak Flow --      Pain Score 02/12/20 1215 5     Pain Loc --      Pain Edu? --      Excl. in [place]? --     Constitutional: Alert and oriented.  Relatively well appearing and in no acute distress. Eyes: Conjunctivae are normal.  Head: Atraumatic. Nose: No congestion/rhinnorhea. Mouth/Throat: Mucous membranes are moist.   Neck: Normal range of motion.  Cardiovascular: Tachycardic, irregular rhythm. Grossly normal heart sounds.  Good peripheral circulation. Respiratory: Normal respiratory effort.  No retractions. Lungs CTAB. Gastrointestinal: No distention.  Musculoskeletal: No lower extremity edema.   No calf or popliteal swelling or tenderness.  Extremities warm and well perfused.  Neurologic:  Normal speech and language. No gross focal neurologic deficits are appreciated.  Skin:  Skin is warm and dry. No rash noted. Psychiatric: Mood and affect are normal. Speech and behavior are normal.  ____________________________________________   LABS (all labs ordered are listed, but only abnormal results are displayed)  Labs Reviewed  BASIC METABOLIC PANEL - Abnormal; Notable for the following components:      Result Value   Glucose, Bld 216 (*)    All other components within normal limits  CBC  POC SARS CORONAVIRUS 2 AG -  ED  POC SARS CORONAVIRUS 2 AG -  ED  TROPONIN I (HIGH SENSITIVITY)  TROPONIN I (HIGH SENSITIVITY)   ____________________________________________  EKG  ED ECG REPORT I, Arta Silence, the attending physician, personally viewed and interpreted this ECG.  Date: 02/12/2020 EKG Time: 1156 Rate: 100 Rhythm: Atrial fibrillation QRS Axis: Left axis deviation Intervals: Right bundle branch block ST/T Wave abnormalities: normal Narrative Interpretation: Atrial fibrillation with no evidence of acute ischemia  ____________________________________________  RADIOLOGY  CT angio chest/abdomen/pelvis: No acute vascular findings.  Possible small focus ablation of the right colon.  ____________________________________________   PROCEDURES  Procedure(s) performed: No  Procedures  Critical Care performed: No ____________________________________________   INITIAL IMPRESSION / ASSESSMENT AND PLAN / ED COURSE  Pertinent labs & imaging results that were available during my care of the patient were reviewed by me and considered in my medical decision making (see chart for details).  64 year old female with PMH as noted above presents with chest discomfort, palpitations, and lightheadedness starting this morning.  She is on Eliquis and metoprolol for atrial  fibrillation.  I reviewed the past medical records in [place].  The patient was most recently admitted in April with an episode of shortness of breath and hypoxia.  She was ruled out for pneumonia and treated with Rocephin and Solu-Medrol.  She has not had any other ED visits since then.  She follows with Dr. Yancey Flemings from cardiology.  On exam currently, the patient is  overall well-appearing.  She is hypertensive and tachycardic, with a rate fluctuating between the 110s 130s.  The rhythm appears to be atrial fibrillation.  Exam is otherwise unremarkable.  The lungs are clear.  There is no significant peripheral edema.  EKG does not show any ischemic changes  Overall I suspect most likely rapid atrial fibrillation as the etiology of the patient's chest discomfort and lightheadedness.  I have a low suspicion for ACS as the patient has no history of CAD.  Given the tachycardia, hypertension, and the radiation of the pain to the back, I will obtain a CT angio to rule out PE or aortic dissection although my clinical suspicion is also low for these.  We will give IV metoprolol for rate control and obtain labs.  ----------------------------------------- 3:39 PM on 02/12/2020 -----------------------------------------  After the metoprolol, the patient's heart rate has remained in the 90s or lower.  The lab work-up is unremarkable including negative troponins x2.  The patient had a negative Covid antigen test.  CT angio showed no acute vascular abnormalities, although there is some suggestion of a small area of possible diverticulitis.  I talked to the patient again, and she states that she has in fact been having some abdominal bloating, gas, cramping, and diarrhea which is consistent with possible diverticulitis.  Therefore I will treat with antibiotics.  This also could be exacerbating the patient's atrial fibrillation.  The patient still has some palpitations despite the improvement in the heart rate.  We will  give a p.o. dose of metoprolol, dose of IV Zofran, and the initial dose of the antibiotics.  If the patient's rate remains controlled and she is able to get up and ambulate without recurrent palpitations or lightheadedness, she will be appropriate for discharge home.  I signed the patient out to the oncoming physician Dr. Ellender Hose.  ________________________  Kirsten [name] was evaluated in Emergency Department on 02/12/2020 for the symptoms described in the history of present illness. She was evaluated in the context of the global COVID-19 pandemic, which necessitated consideration that the patient might be at risk for infection with the SARS-CoV-2 virus that causes COVID-19. Institutional protocols and algorithms that pertain to the evaluation of patients at risk for COVID-19 are in a state of rapid change based on information released by regulatory bodies including the CDC and federal and state organizations. These policies and algorithms were followed during the patient's care in the ED.  ____________________________________________   FINAL CLINICAL IMPRESSION(S) / ED DIAGNOSES  Final diagnoses:  Atrial fibrillation, unspecified type ([place])  Atypical chest pain  Diverticulitis      NEW MEDICATIONS STARTED DURING THIS VISIT:  New Prescriptions   CIPROFLOXACIN (CIPRO) 500 MG TABLET    Take 1 tablet (500 mg total) by mouth 2 (two) times daily for 7 days.   METRONIDAZOLE (FLAGYL) 500 MG TABLET    Take 1 tablet (500 mg total) by mouth 3 (three) times daily for 7 days.   ONDANSETRON (ZOFRAN ODT) 8 MG DISINTEGRATING TABLET    Take 1 tablet (8 mg total) by mouth every 8 (eight) hours as needed for nausea or vomiting.     Note:  This document was prepared using Dragon voice recognition software and may include unintentional dictation errors.    Arta Silence, MD 02/12/20 [phone]

## 2020-02-12 NOTE — ED Notes (Signed)
Pt endorsing increased nausea after ambulating.

## 2020-02-12 NOTE — ED Notes (Signed)
PT with pmh of afib on eliquis presents today after a covid + exposure. Pt endorsing chest pain and back pain with a sensation "of my heart beating really fast." Pt placed on monitor. Pt hr intermittently 140-150's. Currently 93bpm.

## 2020-02-12 NOTE — ED Triage Notes (Addendum)
Pt reports feeling of centralized chest pain that started this morning, pain is non-radiating and she describes her chest feels like its beating fast. Also reports some upper back pain. Hx of afib- takes eliquis. Also reports some shortness of breath and weakness, upper extremities feel "heavy". Reports recent covid test d/t recent exposure.

## 2020-02-12 NOTE — Discharge Instructions (Signed)
Resume your normal metoprolol tomorrow morning and continue to take it as normal.  Follow-up with Dr. Humphrey Rolls within the next week.  Take the Cipro and Flagyl as prescribed for the next week.  Return to the ER for new, worsening, or persistent severe competitions, chest discomfort, weakness or lightheadedness, shortness of breath, abdominal pain, vomiting or inability to take anything by mouth, or any other new or worsening symptoms that concern you.

## 2020-02-14 LAB — NOVEL CORONAVIRUS, NAA: SARS-CoV-2, NAA: NOT DETECTED

## 2020-02-15 DIAGNOSIS — I10 Essential (primary) hypertension: Secondary | ICD-10-CM | POA: Diagnosis not present

## 2020-02-15 DIAGNOSIS — R002 Palpitations: Secondary | ICD-10-CM | POA: Diagnosis not present

## 2020-02-15 DIAGNOSIS — I4891 Unspecified atrial fibrillation: Secondary | ICD-10-CM | POA: Diagnosis not present

## 2020-02-15 DIAGNOSIS — R0602 Shortness of breath: Secondary | ICD-10-CM | POA: Diagnosis not present

## 2020-02-15 DIAGNOSIS — I2510 Atherosclerotic heart disease of native coronary artery without angina pectoris: Secondary | ICD-10-CM | POA: Diagnosis not present

## 2020-02-17 DIAGNOSIS — Z6832 Body mass index (BMI) 32.0-32.9, adult: Secondary | ICD-10-CM | POA: Diagnosis not present

## 2020-02-17 DIAGNOSIS — I10 Essential (primary) hypertension: Secondary | ICD-10-CM | POA: Diagnosis not present

## 2020-02-17 DIAGNOSIS — M461 Sacroiliitis, not elsewhere classified: Secondary | ICD-10-CM | POA: Diagnosis not present

## 2020-02-19 DIAGNOSIS — I10 Essential (primary) hypertension: Secondary | ICD-10-CM | POA: Diagnosis not present

## 2020-02-19 DIAGNOSIS — I4891 Unspecified atrial fibrillation: Secondary | ICD-10-CM | POA: Diagnosis not present

## 2020-02-19 DIAGNOSIS — I2510 Atherosclerotic heart disease of native coronary artery without angina pectoris: Secondary | ICD-10-CM | POA: Diagnosis not present

## 2020-02-19 DIAGNOSIS — R079 Chest pain, unspecified: Secondary | ICD-10-CM | POA: Diagnosis not present

## 2020-02-19 DIAGNOSIS — R0602 Shortness of breath: Secondary | ICD-10-CM | POA: Diagnosis not present

## 2020-02-22 DIAGNOSIS — I2510 Atherosclerotic heart disease of native coronary artery without angina pectoris: Secondary | ICD-10-CM | POA: Diagnosis not present

## 2020-02-22 DIAGNOSIS — I10 Essential (primary) hypertension: Secondary | ICD-10-CM | POA: Diagnosis not present

## 2020-02-22 DIAGNOSIS — R0602 Shortness of breath: Secondary | ICD-10-CM | POA: Diagnosis not present

## 2020-02-22 DIAGNOSIS — I4891 Unspecified atrial fibrillation: Secondary | ICD-10-CM | POA: Diagnosis not present

## 2020-02-22 DIAGNOSIS — R079 Chest pain, unspecified: Secondary | ICD-10-CM | POA: Diagnosis not present

## 2020-03-01 ENCOUNTER — Inpatient Hospital Stay
Admission: EM | Admit: 2020-03-01 | Discharge: 2020-03-05 | DRG: 177 | Disposition: A | Discharge status: Home / Self Care | Payer: PPO | Attending: Internal Medicine | Admitting: Internal Medicine

## 2020-03-01 ENCOUNTER — Other Ambulatory Visit: Payer: Self-pay

## 2020-03-01 ENCOUNTER — Emergency Department: Payer: PPO

## 2020-03-01 DIAGNOSIS — Z9989 Dependence on other enabling machines and devices: Secondary | ICD-10-CM | POA: Diagnosis present

## 2020-03-01 DIAGNOSIS — F419 Anxiety disorder, unspecified: Secondary | ICD-10-CM | POA: Diagnosis not present

## 2020-03-01 DIAGNOSIS — J4530 Mild persistent asthma, uncomplicated: Secondary | ICD-10-CM | POA: Diagnosis not present

## 2020-03-01 DIAGNOSIS — I480 Paroxysmal atrial fibrillation: Secondary | ICD-10-CM | POA: Diagnosis present

## 2020-03-01 DIAGNOSIS — R7989 Other specified abnormal findings of blood chemistry: Secondary | ICD-10-CM | POA: Diagnosis present

## 2020-03-01 DIAGNOSIS — E039 Hypothyroidism, unspecified: Secondary | ICD-10-CM | POA: Diagnosis not present

## 2020-03-01 DIAGNOSIS — J811 Chronic pulmonary edema: Secondary | ICD-10-CM | POA: Diagnosis not present

## 2020-03-01 DIAGNOSIS — J069 Acute upper respiratory infection, unspecified: Secondary | ICD-10-CM | POA: Insufficient documentation

## 2020-03-01 DIAGNOSIS — E785 Hyperlipidemia, unspecified: Secondary | ICD-10-CM | POA: Diagnosis present

## 2020-03-01 DIAGNOSIS — Z807 Family history of other malignant neoplasms of lymphoid, hematopoietic and related tissues: Secondary | ICD-10-CM | POA: Diagnosis not present

## 2020-03-01 DIAGNOSIS — Z888 Allergy status to other drugs, medicaments and biological substances status: Secondary | ICD-10-CM | POA: Diagnosis not present

## 2020-03-01 DIAGNOSIS — R0602 Shortness of breath: Secondary | ICD-10-CM | POA: Diagnosis not present

## 2020-03-01 DIAGNOSIS — G4733 Obstructive sleep apnea (adult) (pediatric): Secondary | ICD-10-CM | POA: Diagnosis not present

## 2020-03-01 DIAGNOSIS — I10 Essential (primary) hypertension: Secondary | ICD-10-CM | POA: Diagnosis present

## 2020-03-01 DIAGNOSIS — K219 Gastro-esophageal reflux disease without esophagitis: Secondary | ICD-10-CM | POA: Diagnosis not present

## 2020-03-01 DIAGNOSIS — J1282 Pneumonia due to coronavirus disease 2019: Secondary | ICD-10-CM

## 2020-03-01 DIAGNOSIS — R0902 Hypoxemia: Secondary | ICD-10-CM | POA: Diagnosis not present

## 2020-03-01 DIAGNOSIS — Z7901 Long term (current) use of anticoagulants: Secondary | ICD-10-CM | POA: Diagnosis not present

## 2020-03-01 DIAGNOSIS — Z7984 Long term (current) use of oral hypoglycemic drugs: Secondary | ICD-10-CM

## 2020-03-01 DIAGNOSIS — Z9049 Acquired absence of other specified parts of digestive tract: Secondary | ICD-10-CM | POA: Diagnosis not present

## 2020-03-01 DIAGNOSIS — J9601 Acute respiratory failure with hypoxia: Secondary | ICD-10-CM | POA: Diagnosis not present

## 2020-03-01 DIAGNOSIS — F418 Other specified anxiety disorders: Secondary | ICD-10-CM | POA: Diagnosis present

## 2020-03-01 DIAGNOSIS — Z880 Allergy status to penicillin: Secondary | ICD-10-CM | POA: Diagnosis not present

## 2020-03-01 DIAGNOSIS — Z6832 Body mass index (BMI) 32.0-32.9, adult: Secondary | ICD-10-CM | POA: Diagnosis not present

## 2020-03-01 DIAGNOSIS — F32A Depression, unspecified: Secondary | ICD-10-CM | POA: Diagnosis not present

## 2020-03-01 DIAGNOSIS — R001 Bradycardia, unspecified: Secondary | ICD-10-CM | POA: Diagnosis present

## 2020-03-01 DIAGNOSIS — J9811 Atelectasis: Secondary | ICD-10-CM | POA: Diagnosis not present

## 2020-03-01 DIAGNOSIS — E669 Obesity, unspecified: Secondary | ICD-10-CM | POA: Diagnosis not present

## 2020-03-01 DIAGNOSIS — E119 Type 2 diabetes mellitus without complications: Secondary | ICD-10-CM | POA: Diagnosis not present

## 2020-03-01 DIAGNOSIS — Z79899 Other long term (current) drug therapy: Secondary | ICD-10-CM

## 2020-03-01 DIAGNOSIS — J8410 Pulmonary fibrosis, unspecified: Secondary | ICD-10-CM | POA: Diagnosis present

## 2020-03-01 DIAGNOSIS — Z833 Family history of diabetes mellitus: Secondary | ICD-10-CM

## 2020-03-01 DIAGNOSIS — K589 Irritable bowel syndrome without diarrhea: Secondary | ICD-10-CM | POA: Diagnosis present

## 2020-03-01 DIAGNOSIS — Z800 Family history of malignant neoplasm of digestive organs: Secondary | ICD-10-CM | POA: Diagnosis not present

## 2020-03-01 DIAGNOSIS — U071 COVID-19: Secondary | ICD-10-CM | POA: Diagnosis present

## 2020-03-01 DIAGNOSIS — Z79890 Hormone replacement therapy (postmenopausal): Secondary | ICD-10-CM

## 2020-03-01 LAB — CBC
HCT: 43.1 % (ref 36.0–46.0)
Hemoglobin: 13.4 g/dL (ref 12.0–15.0)
MCH: 28.5 pg (ref 26.0–34.0)
MCHC: 31.1 g/dL (ref 30.0–36.0)
MCV: 91.7 fL (ref 80.0–100.0)
Platelets: 298 10*3/uL (ref 150–400)
RBC: 4.7 MIL/uL (ref 3.87–5.11)
RDW: 12.7 % (ref 11.5–15.5)
WBC: 8.4 10*3/uL (ref 4.0–10.5)
nRBC: 0 % (ref 0.0–0.2)

## 2020-03-01 LAB — CBG MONITORING, ED
Glucose-Capillary: 209 mg/dL — ABNORMAL HIGH (ref 70–99)
Glucose-Capillary: 290 mg/dL — ABNORMAL HIGH (ref 70–99)
Glucose-Capillary: 195 mg/dL — ABNORMAL HIGH (ref 70–99)

## 2020-03-01 LAB — C-REACTIVE PROTEIN: CRP: 0.8 mg/dL (ref <1.0)

## 2020-03-01 LAB — GLUCOSE, CAPILLARY: Glucose-Capillary: 266 mg/dL — ABNORMAL HIGH (ref 70–99)

## 2020-03-01 LAB — HEPATIC FUNCTION PANEL
ALT: 161 U/L — ABNORMAL HIGH (ref 0–44)
AST: 217 U/L — ABNORMAL HIGH (ref 15–41)
Albumin: 3.5 g/dL (ref 3.5–5.0)
Alkaline Phosphatase: 69 U/L (ref 38–126)
Bilirubin, Direct: 0.1 mg/dL (ref 0.0–0.2)
Indirect Bilirubin: 0.5 mg/dL (ref 0.3–0.9)
Total Bilirubin: 0.6 mg/dL (ref 0.3–1.2)
Total Protein: 6.6 g/dL (ref 6.5–8.1)

## 2020-03-01 LAB — FIBRINOGEN: Fibrinogen: 413 mg/dL (ref 210–475)

## 2020-03-01 LAB — BASIC METABOLIC PANEL
Anion gap: 11 (ref 5–15)
BUN: 14 mg/dL (ref 8–23)
CO2: 30 mmol/L (ref 22–32)
Calcium: 9.5 mg/dL (ref 8.9–10.3)
Chloride: 97 mmol/L — ABNORMAL LOW (ref 98–111)
Creatinine, Ser: 0.94 mg/dL (ref 0.44–1.00)
GFR, Estimated: >60 mL/min (ref >60)
Glucose, Bld: 212 mg/dL — ABNORMAL HIGH (ref 70–99)
Potassium: 4.6 mmol/L (ref 3.5–5.1)
Sodium: 138 mmol/L (ref 135–145)

## 2020-03-01 LAB — FIBRIN DERIVATIVES D-DIMER (ARMC ONLY): Fibrin derivatives D-dimer (ARMC): 302.05 ng/mL (FEU) (ref 0.00–499.00)

## 2020-03-01 LAB — PROCALCITONIN: Procalcitonin: <0.1 ng/mL

## 2020-03-01 LAB — POC SARS CORONAVIRUS 2 AG -  ED: SARS Coronavirus 2 Ag: POSITIVE — AB

## 2020-03-01 LAB — BRAIN NATRIURETIC PEPTIDE: B Natriuretic Peptide: 51.4 pg/mL (ref 0.0–100.0)

## 2020-03-01 LAB — FERRITIN: Ferritin: 122 ng/mL (ref 11–307)

## 2020-03-01 LAB — TRIGLYCERIDES: Triglycerides: 80 mg/dL (ref <150)

## 2020-03-01 LAB — ABO/RH: ABO/RH(D): O POS

## 2020-03-01 LAB — LACTATE DEHYDROGENASE: LDH: 236 U/L — ABNORMAL HIGH (ref 98–192)

## 2020-03-01 LAB — HEPATITIS B SURFACE ANTIGEN: Hepatitis B Surface Ag: NONREACTIVE

## 2020-03-01 LAB — TROPONIN I (HIGH SENSITIVITY)
Troponin I (High Sensitivity): 17 ng/L (ref <18)
Troponin I (High Sensitivity): 17 ng/L (ref <18)

## 2020-03-01 MED ORDER — INSULIN ASPART 100 UNIT/ML ~~LOC~~ SOLN
0.0000 [IU] | Freq: Three times a day (TID) | SUBCUTANEOUS | Status: DC
  Administered 2020-03-01: 2 [IU] via SUBCUTANEOUS
  Administered 2020-03-01: 3 [IU] via SUBCUTANEOUS
  Administered 2020-03-01 – 2020-03-02 (×2): 5 [IU] via SUBCUTANEOUS
  Administered 2020-03-02: 3 [IU] via SUBCUTANEOUS
  Administered 2020-03-02: 13:00:00 5 [IU] via SUBCUTANEOUS
  Administered 2020-03-03: 2 [IU] via SUBCUTANEOUS
  Administered 2020-03-03: 10:00:00 1 [IU] via SUBCUTANEOUS
  Administered 2020-03-03 – 2020-03-04 (×2): 5 [IU] via SUBCUTANEOUS
  Administered 2020-03-04: 1 [IU] via SUBCUTANEOUS
  Administered 2020-03-04: 3 [IU] via SUBCUTANEOUS
  Filled 2020-03-01 (×11): qty 1

## 2020-03-01 MED ORDER — LORAZEPAM 2 MG/ML IJ SOLN
0.5000 mg | Freq: Once | INTRAMUSCULAR | Status: AC
  Administered 2020-03-01: 0.5 mg via INTRAVENOUS
  Filled 2020-03-01: qty 1

## 2020-03-01 MED ORDER — DM-GUAIFENESIN ER 30-600 MG PO TB12
1.0000 | ORAL_TABLET | Freq: Two times a day (BID) | ORAL | Status: DC | PRN
  Administered 2020-03-02 – 2020-03-04 (×3): 1 via ORAL
  Filled 2020-03-01 (×3): qty 1

## 2020-03-01 MED ORDER — ACETAMINOPHEN 325 MG PO TABS
650.0000 mg | ORAL_TABLET | Freq: Four times a day (QID) | ORAL | Status: DC | PRN
  Administered 2020-03-01: 650 mg via ORAL
  Filled 2020-03-01: qty 2

## 2020-03-01 MED ORDER — LEVOTHYROXINE SODIUM 100 MCG PO TABS: 100.0000 ug | ORAL_TABLET | Freq: Every day | ORAL | Status: DC

## 2020-03-01 MED ORDER — INSULIN ASPART 100 UNIT/ML ~~LOC~~ SOLN
0.0000 [IU] | Freq: Every day | SUBCUTANEOUS | Status: DC
  Administered 2020-03-01: 22:00:00 3 [IU] via SUBCUTANEOUS
  Administered 2020-03-03: 22:00:00 2 [IU] via SUBCUTANEOUS
  Filled 2020-03-01 (×2): qty 1

## 2020-03-01 MED ORDER — HYDRALAZINE HCL 20 MG/ML IJ SOLN: 5.0000 mg | INTRAMUSCULAR | Status: DC | PRN

## 2020-03-01 MED ORDER — METOPROLOL SUCCINATE ER 50 MG PO TB24
50.0000 mg | ORAL_TABLET | Freq: Every day | ORAL | Status: DC
Start: 2020-03-01 — End: 2020-03-02
  Administered 2020-03-01: 50 mg via ORAL
  Filled 2020-03-01: qty 1

## 2020-03-01 MED ORDER — ATORVASTATIN CALCIUM 20 MG PO TABS
40.0000 mg | ORAL_TABLET | Freq: Every day | ORAL | Status: DC
  Administered 2020-03-01 – 2020-03-02 (×2): 40 mg via ORAL
  Filled 2020-03-01 (×2): qty 2

## 2020-03-01 MED ORDER — AMIODARONE HCL 200 MG PO TABS
200.0000 mg | ORAL_TABLET | Freq: Every day | ORAL | Status: DC
  Administered 2020-03-01 – 2020-03-02 (×2): 200 mg via ORAL
  Filled 2020-03-01 (×2): qty 1

## 2020-03-01 MED ORDER — METHYLPREDNISOLONE SODIUM SUCC 40 MG IJ SOLR
40.0000 mg | Freq: Two times a day (BID) | INTRAMUSCULAR | Status: DC
  Administered 2020-03-01 – 2020-03-02 (×3): 40 mg via INTRAVENOUS
  Filled 2020-03-01 (×3): qty 1

## 2020-03-01 MED ORDER — LEVOTHYROXINE SODIUM 100 MCG PO TABS
100.0000 ug | ORAL_TABLET | Freq: Every day | ORAL | Status: DC
  Administered 2020-03-02 – 2020-03-05 (×4): 100 ug via ORAL
  Filled 2020-03-01 (×4): qty 1

## 2020-03-01 MED ORDER — ZOLPIDEM TARTRATE 5 MG PO TABS
5.0000 mg | ORAL_TABLET | Freq: Every evening | ORAL | Status: DC | PRN
  Administered 2020-03-01 – 2020-03-04 (×4): 5 mg via ORAL
  Filled 2020-03-01 (×4): qty 1

## 2020-03-01 MED ORDER — PREDNISONE 20 MG PO TABS: 50.0000 mg | ORAL_TABLET | Freq: Every day | ORAL | Status: DC

## 2020-03-01 MED ORDER — ZINC SULFATE 220 (50 ZN) MG PO CAPS
220.0000 mg | ORAL_CAPSULE | Freq: Every day | ORAL | Status: DC
  Administered 2020-03-01 – 2020-03-05 (×5): 220 mg via ORAL
  Filled 2020-03-01 (×6): qty 1

## 2020-03-01 MED ORDER — ONDANSETRON HCL 4 MG PO TABS: 4.0000 mg | ORAL_TABLET | Freq: Four times a day (QID) | ORAL | Status: DC | PRN

## 2020-03-01 MED ORDER — METHYLPREDNISOLONE SODIUM SUCC 125 MG IJ SOLR
96.0000 mg | Freq: Two times a day (BID) | INTRAMUSCULAR | Status: DC
  Administered 2020-03-01: 96 mg via INTRAVENOUS
  Filled 2020-03-01: qty 2

## 2020-03-01 MED ORDER — IPRATROPIUM BROMIDE HFA 17 MCG/ACT IN AERS
2.0000 | INHALATION_SPRAY | RESPIRATORY_TRACT | Status: DC
  Administered 2020-03-01 – 2020-03-05 (×23): 2 via RESPIRATORY_TRACT
  Filled 2020-03-01: qty 12.9

## 2020-03-01 MED ORDER — SODIUM CHLORIDE 0.9 % IV SOLN
200.0000 mg | Freq: Once | INTRAVENOUS | Status: AC
  Administered 2020-03-01: 200 mg via INTRAVENOUS
  Filled 2020-03-01: qty 40

## 2020-03-01 MED ORDER — ENOXAPARIN SODIUM 40 MG/0.4ML ~~LOC~~ SOLN: 40.0000 mg | SUBCUTANEOUS | Status: DC

## 2020-03-01 MED ORDER — APIXABAN 5 MG PO TABS
5.0000 mg | ORAL_TABLET | Freq: Two times a day (BID) | ORAL | Status: DC
  Administered 2020-03-01 – 2020-03-05 (×9): 5 mg via ORAL
  Filled 2020-03-01 (×9): qty 1

## 2020-03-01 MED ORDER — HYDROCHLOROTHIAZIDE 12.5 MG PO CAPS
12.5000 mg | ORAL_CAPSULE | Freq: Every day | ORAL | Status: DC
  Administered 2020-03-01 – 2020-03-05 (×5): 12.5 mg via ORAL
  Filled 2020-03-01 (×5): qty 1

## 2020-03-01 MED ORDER — SODIUM CHLORIDE 0.9 % IV SOLN
100.0000 mg | Freq: Every day | INTRAVENOUS | Status: AC
  Administered 2020-03-02 – 2020-03-05 (×4): 100 mg via INTRAVENOUS
  Filled 2020-03-01 (×4): qty 20

## 2020-03-01 MED ORDER — ALBUTEROL SULFATE HFA 108 (90 BASE) MCG/ACT IN AERS
2.0000 | INHALATION_SPRAY | RESPIRATORY_TRACT | Status: DC | PRN
  Filled 2020-03-01: qty 6.7

## 2020-03-01 MED ORDER — PAROXETINE HCL ER 12.5 MG PO TB24
25.0000 mg | ORAL_TABLET | Freq: Every evening | ORAL | Status: DC
  Administered 2020-03-01 – 2020-03-04 (×4): 25 mg via ORAL
  Filled 2020-03-01 (×5): qty 2

## 2020-03-01 MED ORDER — ASCORBIC ACID 500 MG PO TABS
500.0000 mg | ORAL_TABLET | Freq: Every day | ORAL | Status: DC
  Administered 2020-03-01 – 2020-03-05 (×5): 500 mg via ORAL
  Filled 2020-03-01 (×6): qty 1

## 2020-03-01 NOTE — ED Notes (Addendum)
Resting in bed with lights off.

## 2020-03-01 NOTE — ED Notes (Signed)
Pt expressed concern over home meds being ordered. Told her would write attending. Told her that med reconciliation takes a little time. Sent msg to Dr Blaine Hamper to inform of her concern.

## 2020-03-01 NOTE — ED Notes (Signed)
Called 1C. Bed assigned since >30 min but no nurse assigned. They will assign nurse.

## 2020-03-01 NOTE — ED Notes (Signed)
Messaged receiving nurse.

## 2020-03-01 NOTE — ED Notes (Signed)
Lab called collect morning labs, stated they are aware.

## 2020-03-01 NOTE — ED Notes (Signed)
CBG 198. Not showing in narrator.

## 2020-03-01 NOTE — ED Triage Notes (Signed)
Pt diaphoretic on triage entrance. Pt with O2 saturation of 79-82% room air. Pt placed onto 4L, with rise to 96%. Pt reports she has had cough, congestion and fever x2 days, tonight increased SOB.

## 2020-03-01 NOTE — ED Provider Notes (Signed)
[facility] Emergency Department Provider Note   ____________________________________________   Event Date/Time   First MD Initiated Contact with Patient 03/01/20 0421     (approximate)  I have reviewed the triage vital signs and the nursing notes.   HISTORY  Chief Complaint Shortness of Breath    HPI Kirsten [name] is a 65 y.o. female who presents to the ED from home with a chief complaint of cough, congestion, fever and body aches.  Patient with a history of atrial fibrillation on Eliquis, GERD, hypertension, hyperlipidemia, pulmonary fibrosis.  Patient is vaccinated against COVID-19 plus has had her booster shot.  Once tonight due to increased shortness of breath.  Patient walked from the parking lot to the lobby and arrives diaphoretic with room air saturations 82%.  Placed on 2 4 L nasal cannula oxygen with saturations 96%.  Denies chest pain, abdominal pain, nausea, vomiting or diarrhea.     Past Medical History:  Diagnosis Date  . A-fib ([place])   . Anxiety disorder   . Asthma   . Dyspnea   . Gastroschisis    umphalocele, rotated organs abd repair until age 56  . GERD (gastroesophageal reflux disease)   . HTN (hypertension)   . Hyperlipidemia   . IBS (irritable bowel syndrome)   . Morbid obesity ([place])    Target wt - 185  for BMI < 30  . Obesity   . OSA on CPAP   . Pulmonary fibrosis ([place])   . SBO (small bowel obstruction) ([place])    Resolved with NG/Bowel rest around 2009  . Sleep apnea   . Type II or unspecified type diabetes mellitus without mention of complication, not stated as uncontrolled     Patient Active Problem List   Diagnosis Date Noted  . Spinal stenosis, lumbar region with neurogenic claudication 12/31/2019  . DDD (degenerative disc disease), lumbar 08/11/2019  . Primary osteoarthritis of right hip 08/11/2019  . Urinary urgency 08/10/2019  . NASH (nonalcoholic steatohepatitis) 06/02/2019  . Diabetes mellitus without  complication ([place]) [id number]/41/[id number]  . Asthma exacerbation 05/23/2019  . Colon cancer screening 12/25/2017  . Estrogen deficiency 12/25/2017  . Pulmonary fibrosis ([place])   . 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 Insomnia w/ sleep apnea 03/21/2017  . Class 1 obesity with serious comorbidity and body mass index (BMI) of 34.0 to 34.9 in adult 01/31/2016  . Vaginal atrophy 01/13/2015  . Insomnia due to anxiety and fear 10/11/2014  . GAD (generalized anxiety disorder) 10/11/2014  . Paroxysmal atrial fibrillation ([place]) 04/08/2014  . Hyperlipidemia with target LDL less than 100 10/22/2012  . Asthma, mild persistent 04/06/2011  . Hypertriglyceridemia 01/29/2011  . Vitamin B 12 deficiency 06/28/2009  . Hypothyroidism 06/20/2009  . Depression with anxiety 06/20/2009  . Type II diabetes mellitus with manifestations ([place]) 04/08/2009  . Essential hypertension, benign 04/08/2009  . OSA (obstructive sleep apnea) 04/08/2009    Past Surgical History:  Procedure Laterality Date  . BREAST EXCISIONAL BIOPSY Left 03/19/2012   neg  . CESAREAN SECTION    . CHOLECYSTECTOMY  1992  . COLONOSCOPY  2011   2011-normal  . Newborn Surgery - GI - ORGANS OUTSIDE ABDOMEN    . Small Bowel Repair    . TUBAL LIGATION  1988    Prior to Admission medications   Medication Sig Start Date End Date Taking? Authorizing Provider  albuterol (VENTOLIN HFA) 108 (90 Base) MCG/ACT inhaler Inhale 2 puffs into the lungs every 6 (six)  hours as needed for wheezing or shortness of breath. 02/11/19   Biagio Borg, MD  amiodarone (PACERONE) 200 MG tablet Take 1 tablet (200 mg total) by mouth daily. 11/16/19   [name], MD  apixaban (ELIQUIS) 5 MG TABS tablet Take 1 tablet (5 mg total) by mouth 2 (two) times daily. 11/16/19   [name], MD  atorvastatin (LIPITOR) 40 MG tablet TAKE 1 TABLET(40 MG) BY MOUTH DAILY 01/08/20   [name], MD  Continuous Blood Gluc Receiver (FREESTYLE LIBRE 14 DAY READER) DEVI 1  Act by Does not apply route daily. 11/16/19   [name], MD  Continuous Blood Gluc Sensor (FREESTYLE LIBRE 14 DAY SENSOR) MISC 1 Act by Does not apply route daily. 11/16/19   [name], MD  hydrochlorothiazide (MICROZIDE) 12.5 MG capsule Take 1 capsule (12.5 mg total) by mouth daily. 12/31/19   [name], MD  levothyroxine (SYNTHROID) 100 MCG tablet Take 1 tablet (100 mcg total) by mouth daily before breakfast. 12/31/19   [name], MD  metFORMIN (GLUCOPHAGE) 1000 MG tablet One po BID 12/31/19   [name], MD  metoprolol succinate (TOPROL-XL) 50 MG 24 hr tablet Take 1 tablet (50 mg total) by mouth daily. 11/16/19   [name], MD  ondansetron (ZOFRAN ODT) 8 MG disintegrating tablet Take 1 tablet (8 mg total) by mouth every 8 (eight) hours as needed for nausea or vomiting. 02/12/20   Arta Silence, MD  PARoxetine (PAXIL-CR) 25 MG 24 hr tablet TAKE 1 TABLET(25 MG) BY MOUTH DAILY 12/31/19   [name], MD  pioglitazone (ACTOS) 15 MG tablet Take 1 tablet (15 mg total) by mouth daily. 11/19/19   [name], MD  Semaglutide (RYBELSUS) 3 MG TABS Take 1 tablet by mouth daily. 01/01/20   [name], MD  zolpidem (AMBIEN) 10 MG tablet TAKE 1 TABLET(10 MG) BY MOUTH AT BEDTIME AS NEEDED 12/31/19   [name], MD    Allergies Factive [gemifloxacin mesylate], Crestor [rosuvastatin], Sulfonamide derivatives, Gemifloxacin, and Penicillins  Family History  Problem Relation Age of Onset  . Lymphoma Mother   . Diabetes type II Father   . Colon cancer Father   . Diabetes type I Son   . Diabetes Maternal Grandmother   . Goiter Neg Hx   . Breast cancer Neg Hx   . Ovarian cancer Neg Hx     Social History Social History   Tobacco Use  . Smoking status: Never Smoker  . Smokeless tobacco: Never Used  . Tobacco comment: "tried as a teen"  Vaping Use  . Vaping Use: Never used  Substance Use Topics  . Alcohol use: Yes    Alcohol/week: 2.0 standard  drinks    Types: 2 Glasses of wine per week    Comment: rare  . Drug use: No    Review of Systems  Constitutional: No fever/chills Eyes: No visual changes. ENT: No sore throat. Cardiovascular: Denies chest pain. Respiratory: Denies shortness of breath. Gastrointestinal: No abdominal pain.  No nausea, no vomiting.  No diarrhea.  No constipation. Genitourinary: Negative for dysuria. Musculoskeletal: Negative for back pain. Skin: Negative for rash. Neurological: Negative for headaches, focal weakness or numbness.   ____________________________________________   PHYSICAL EXAM:  VITAL SIGNS: ED Triage Vitals [03/01/20 0226]  Enc Vitals Group     BP 139/61     Pulse Rate 69     Resp (!) 24     Temp  99.6 F (37.6 C)     Temp Source Oral     SpO2 (!) 82 %     Weight      Height      Head Circumference      Peak Flow      Pain Score      Pain Loc      Pain Edu?      Excl. in [place]?     Constitutional: Alert and oriented.  Ill appearing and in moderate acute distress. Eyes: Conjunctivae are normal. PERRL. EOMI. Head: Atraumatic. Nose: No congestion/rhinnorhea. Mouth/Throat: Mucous membranes are mildly dry.   Neck: No stridor.   Cardiovascular: Normal rate, regular rhythm. Grossly normal heart sounds.  Good peripheral circulation. Respiratory: Increased respiratory effort.  No retractions. Lungs diminished with scattered rhonchi. Gastrointestinal: Soft and nontender to light or deep palpation. No distention. No abdominal bruits. No CVA tenderness. Musculoskeletal: No lower extremity tenderness nor edema.  No joint effusions. Neurologic:  Normal speech and language. No gross focal neurologic deficits are appreciated.  Skin:  Skin is warm, dry and intact. No rash noted.  No petechiae. Psychiatric: Mood and affect are normal. Speech and behavior are normal.  ____________________________________________   LABS (all labs ordered are listed, but only abnormal results are  displayed)  Labs Reviewed  BASIC METABOLIC PANEL - Abnormal; Notable for the following components:      Result Value   Chloride 97 (*)    Glucose, Bld 212 (*)    All other components within normal limits  POC SARS CORONAVIRUS 2 AG -  ED - Abnormal; Notable for the following components:   SARS Coronavirus 2 Ag Positive (*)    All other components within normal limits  CBC  BRAIN NATRIURETIC PEPTIDE  C-REACTIVE PROTEIN  FIBRIN DERIVATIVES D-DIMER ([facility] ONLY)  FERRITIN  FIBRINOGEN  HEPATITIS B SURFACE ANTIGEN  LACTATE DEHYDROGENASE  PROCALCITONIN  ABO/RH  TROPONIN I (HIGH SENSITIVITY)  TROPONIN I (HIGH SENSITIVITY)   ____________________________________________  EKG  ED ECG REPORT I, Jericca Russett J, the attending physician, personally viewed and interpreted this ECG.   Date: 03/01/2020  EKG Time: 0237  Rate: 71  Rhythm: normal EKG, normal sinus rhythm  Axis: Normal  Intervals:none  ST&T Change: Nonspecific  ____________________________________________  RADIOLOGY I, Lillieann Pavlich J, personally viewed and evaluated these images (plain radiographs) as part of my medical decision making, as well as reviewing the written report by the radiologist.  ED MD interpretation: Mild pulmonary vascular congestion  Official radiology report(s): DG Chest 2 View  Result Date: 03/01/2020 CLINICAL DATA:  Shortness breath and hypoxia EXAM: CHEST - 2 VIEW COMPARISON:  February 12, 2020 FINDINGS: The heart size and mediastinal contours are within normal limits. Streaky atelectasis seen within the right mid lung and left lung base. No large airspace consolidation or pleural effusion. There is prominence of the central pulmonary vasculature. The visualized skeletal structures are unremarkable. IMPRESSION: Mild pulmonary vascular congestion. Electronically Signed   By: Prudencio Pair M.D.   On: 03/01/2020 03:19    ____________________________________________   PROCEDURES  Procedure(s) performed  (including Critical Care):  .1-3 Lead EKG Interpretation Performed by: Paulette Blanch, MD Authorized by: Paulette Blanch, MD     Interpretation: normal     ECG rate:  69   ECG rate assessment: normal     Rhythm: sinus rhythm     Ectopy: none     Conduction: normal   Comments:     Patient placed on cardiac monitor  to evaluate for arrhythmias    CRITICAL CARE Performed by: Paulette Blanch   Total critical care time: 45 minutes  Critical care time was exclusive of separately billable procedures and treating other patients.  Critical care was necessary to treat or prevent imminent or life-threatening deterioration.  Critical care was time spent personally by me on the following activities: development of treatment plan with patient and/or surrogate as well as nursing, discussions with consultants, evaluation of patient's response to treatment, examination of patient, obtaining history from patient or surrogate, ordering and performing treatments and interventions, ordering and review of laboratory studies, ordering and review of radiographic studies, pulse oximetry and re-evaluation of patient's condition.  ____________________________________________   INITIAL IMPRESSION / ASSESSMENT AND PLAN / ED COURSE  As part of my medical decision making, I reviewed the following data within the [facility] notes reviewed and incorporated, Labs reviewed, EKG interpreted, Old chart reviewed, Radiograph reviewed, Discussed with admitting physician and Notes from prior ED visits (02/12/2020 visit for atrial fibrillation with CTA chest negative for PE)     65 year old female who is vaccinated and boosted against COVID-19 presenting with a 2-day history of fever, cough and shortness of breath. Differential includes, but is not limited to, viral syndrome, bronchitis including COPD exacerbation, pneumonia, reactive airway disease including asthma, CHF including exacerbation with or  without pulmonary/interstitial edema, pneumothorax, ACS, thoracic trauma, and pulmonary embolism.  Patient was hypoxic at triage 82% on room air; increased to 96% on  4L nasal cannula oxygen.  Rapid COVID is positive.  Will initiate IV remdesivir, Solu-Medrol and discuss with hospitalist services for admission.      ____________________________________________   FINAL CLINICAL IMPRESSION(S) / ED DIAGNOSES  Final diagnoses:  Pneumonia due to COVID-19 virus  Hypoxia     ED Discharge Orders    None      *Please note:  Kirsten [name] was evaluated in Emergency Department on 03/01/2020 for the symptoms described in the history of present illness. She was evaluated in the context of the global COVID-19 pandemic, which necessitated consideration that the patient might be at risk for infection with the SARS-CoV-2 virus that causes COVID-19. Institutional protocols and algorithms that pertain to the evaluation of patients at risk for COVID-19 are in a state of rapid change based on information released by regulatory bodies including the CDC and federal and state organizations. These policies and algorithms were followed during the patient's care in the ED.  Some ED evaluations and interventions may be delayed as a result of limited staffing during and the pandemic.*   Note:  This document was prepared using Dragon voice recognition software and may include unintentional dictation errors.   Paulette Blanch, MD 03/01/20 [phone]

## 2020-03-01 NOTE — ED Notes (Signed)
Pt ambulated to hall bathroom to void. Steady gait noted. SPO2 dropped to 88% on RA. Pt back in bed on 3L oxygen per [place]. SPO2 96%.

## 2020-03-01 NOTE — Progress Notes (Signed)
Remdesivir - Pharmacy Brief Note   A/P:  Remdesivir 200 mg IVPB once followed by 100 mg IVPB daily x 4 days.   Hart Robinsons, PharmD Clinical Pharmacist

## 2020-03-01 NOTE — H&P (Signed)
History and Physical    Kirsten [name] HQI:[id number] DOB: 04/07/1955 DOA: 03/01/2020  Referring MD/NP/PA:   PCP: [name], MD   Patient coming from:  The patient is coming from home.  At baseline, pt is independent for most of ADL.        Chief Complaint: Shortness of breath  HPI: Kirsten [name] is a 65 y.o. female with medical history significant of hypertension, hyperlipidemia, diabetes mellitus, asthma, GERD, OSA on CPAP, hypothyroidism, pulmonary fibrosis, IBS, atrial fibrillation on Eliquis, gastroschisis, who presents with shortness of breath.  Patient states that she has been having shortness of breath, cough, body aches, generalized weakness, fever, chills, poor appetite in the past 2 days.  Denies chest pain, nausea, vomiting, diarrhea or abdominal pain.  No symptoms of UTI unilateral weakness. Patient was found to have oxygen desaturation to 82% on room air, which improved to 97% on 3 L oxygen.  ED Course: pt was found to have WBC 8.4, positive COVID Ag test, troponin level 17, 17, electrolytes renal function okay, temperature 99.6, blood pressure 112/51, heart rate 6 5, 24, chest x-ray showed mild vascular congestion without obvious infiltration.  Patient is admitted to [place] bed as inpatient.  Review of Systems:   General: has fevers, chills, no body weight gain, has poor appetite, has fatigue HEENT: no blurry vision, hearing changes or sore throat Respiratory: has dyspnea, coughing, no wheezing CV: no chest pain, no palpitations GI: no nausea, vomiting, abdominal pain, diarrhea, constipation GU: no dysuria, burning on urination, increased urinary frequency, hematuria  Ext: no leg edema Neuro: no unilateral weakness, numbness, or tingling, no vision change or hearing loss Skin: no rash, no skin tear. MSK: No muscle spasm, no deformity, no limitation of range of movement in spin Heme: No easy bruising.  Travel history: No recent long distant  travel.  Allergy:  Allergies  Allergen Reactions  . Factive [Gemifloxacin Mesylate] Rash  . Crestor [Rosuvastatin]     GI upset  . Sulfonamide Derivatives     REACTION: rash  . Gemifloxacin Rash  . Penicillins Hives and Rash    Has patient had a PCN reaction causing immediate rash, facial/tongue/throat swelling, SOB or lightheadedness with hypotension: No Has patient had a PCN reaction causing severe rash involving mucus membranes or skin necrosis: No Has patient had a PCN reaction that required hospitalization No Has patient had a PCN reaction occurring within the last 10 years: No If all of the above answers are "NO", then may proceed with Cephalosporin use.     Past Medical History:  Diagnosis Date  . A-fib ([place])   . Anxiety disorder   . Asthma   . Dyspnea   . Gastroschisis    umphalocele, rotated organs abd repair until age 7  . GERD (gastroesophageal reflux disease)   . HTN (hypertension)   . Hyperlipidemia   . IBS (irritable bowel syndrome)   . Morbid obesity ([place])    Target wt - 185  for BMI < 30  . Obesity   . OSA on CPAP   . Pulmonary fibrosis ([place])   . SBO (small bowel obstruction) ([place])    Resolved with NG/Bowel rest around 2009  . Sleep apnea   . Type II or unspecified type diabetes mellitus without mention of complication, not stated as uncontrolled     Past Surgical History:  Procedure Laterality Date  . BREAST EXCISIONAL BIOPSY Left 03/19/2012   neg  . CESAREAN SECTION    .  CHOLECYSTECTOMY  1992  . COLONOSCOPY  2011   2011-normal  . Newborn Surgery - GI - ORGANS OUTSIDE ABDOMEN    . Small Bowel Repair    . TUBAL LIGATION  1988    Social History:  reports that she has never smoked. She has never used smokeless tobacco. She reports current alcohol use of about 2.0 standard drinks of alcohol per week. She reports that she does not use drugs.  Family History:  Family History  Problem Relation Age of Onset  . Lymphoma Mother   . Diabetes type  II Father   . Colon cancer Father   . Diabetes type I Son   . Diabetes Maternal Grandmother   . Goiter Neg Hx   . Breast cancer Neg Hx   . Ovarian cancer Neg Hx      Prior to Admission medications   Medication Sig Start Date End Date Taking? Authorizing Provider  albuterol (VENTOLIN HFA) 108 (90 Base) MCG/ACT inhaler Inhale 2 puffs into the lungs every 6 (six) hours as needed for wheezing or shortness of breath. 02/11/19   Biagio Borg, MD  amiodarone (PACERONE) 200 MG tablet Take 1 tablet (200 mg total) by mouth daily. 11/16/19   [name], MD  apixaban (ELIQUIS) 5 MG TABS tablet Take 1 tablet (5 mg total) by mouth 2 (two) times daily. 11/16/19   [name], MD  atorvastatin (LIPITOR) 40 MG tablet TAKE 1 TABLET(40 MG) BY MOUTH DAILY 01/08/20   [name], MD  Continuous Blood Gluc Receiver (FREESTYLE LIBRE 14 DAY READER) DEVI 1 Act by Does not apply route daily. 11/16/19   [name], MD  Continuous Blood Gluc Sensor (FREESTYLE LIBRE 14 DAY SENSOR) MISC 1 Act by Does not apply route daily. 11/16/19   [name], MD  hydrochlorothiazide (MICROZIDE) 12.5 MG capsule Take 1 capsule (12.5 mg total) by mouth daily. 12/31/19   [name], MD  levothyroxine (SYNTHROID) 100 MCG tablet Take 1 tablet (100 mcg total) by mouth daily before breakfast. 12/31/19   [name], MD  metFORMIN (GLUCOPHAGE) 1000 MG tablet One po BID 12/31/19   [name], MD  metoprolol succinate (TOPROL-XL) 50 MG 24 hr tablet Take 1 tablet (50 mg total) by mouth daily. 11/16/19   [name], MD  ondansetron (ZOFRAN ODT) 8 MG disintegrating tablet Take 1 tablet (8 mg total) by mouth every 8 (eight) hours as needed for nausea or vomiting. 02/12/20   Arta Silence, MD  PARoxetine (PAXIL-CR) 25 MG 24 hr tablet TAKE 1 TABLET(25 MG) BY MOUTH DAILY 12/31/19   [name], MD  pioglitazone (ACTOS) 15 MG tablet Take 1 tablet (15 mg total) by mouth daily. 11/19/19   [name], MD   Semaglutide (RYBELSUS) 3 MG TABS Take 1 tablet by mouth daily. 01/01/20   [name], MD  zolpidem (AMBIEN) 10 MG tablet TAKE 1 TABLET(10 MG) BY MOUTH AT BEDTIME AS NEEDED 12/31/19   [name], MD    Physical Exam: Vitals:   03/01/20 0226 03/01/20 0441 03/01/20 0801  BP: 139/61 (!) 112/51   Pulse: 69 66   Resp: (!) 24 (!) 22   Temp: 99.6 F (37.6 C)  99.3 F (37.4 C)  TempSrc: Oral  Oral  SpO2: (!) 82% 97%    General: Not in acute distress HEENT:       Eyes: PERRL, EOMI, no scleral icterus.       ENT:  No discharge from the ears and nose, no pharynx injection, no tonsillar enlargement.        Neck: No JVD, no bruit, no mass felt. Heme: No neck lymph node enlargement. Cardiac: S1/S2, RRR, No murmurs, No gallops or rubs. Respiratory: has decreased air movement, has rhonchi GI: Soft, nondistended, nontender, no rebound pain, no organomegaly, BS present. GU: No hematuria Ext: No pitting leg edema bilaterally. 2+DP/PT pulse bilaterally. Musculoskeletal: No joint deformities, No joint redness or warmth, no limitation of ROM in spin. Skin: No rashes.  Neuro: Alert, oriented X3, cranial nerves II-XII grossly intact, moves all extremities normally.  Psych: Patient is not psychotic, no suicidal or hemocidal ideation.  Labs on Admission: I have personally reviewed following labs and imaging studies  CBC: Recent Labs  Lab 03/01/20 0245  WBC 8.4  HGB 13.4  HCT 43.1  MCV 91.7  PLT 569   Basic Metabolic Panel: Recent Labs  Lab 03/01/20 0245  NA 138  K 4.6  CL 97*  CO2 30  GLUCOSE 212*  BUN 14  CREATININE 0.94  CALCIUM 9.5   GFR: CrCl cannot be calculated (Unknown ideal weight.). Liver Function Tests: Recent Labs  Lab 03/01/20 0438  AST 217*  ALT 161*  ALKPHOS 69  BILITOT 0.6  PROT 6.6  ALBUMIN 3.5   No results for input(s): LIPASE, AMYLASE in the last 168 hours. No results for input(s): AMMONIA in the last 168 hours. Coagulation Profile: No  results for input(s): INR, PROTIME in the last 168 hours. Cardiac Enzymes: No results for input(s): CKTOTAL, CKMB, CKMBINDEX, TROPONINI in the last 168 hours. BNP (last 3 results) No results for input(s): PROBNP in the last 8760 hours. HbA1C: No results for input(s): HGBA1C in the last 72 hours. CBG: Recent Labs  Lab 03/01/20 0841  GLUCAP 209*   Lipid Profile: No results for input(s): CHOL, HDL, LDLCALC, TRIG, CHOLHDL, LDLDIRECT in the last 72 hours. Thyroid Function Tests: No results for input(s): TSH, T4TOTAL, FREET4, T3FREE, THYROIDAB in the last 72 hours. Anemia Panel: Recent Labs    03/01/20 0438  FERRITIN 122   Urine analysis:    Component Value Date/Time   COLORURINE YELLOW 11/16/2019 1613   APPEARANCEUR Sl Cloudy (A) 11/16/2019 1613   APPEARANCEUR Cloudy (A) 01/01/2017 1403   LABSPEC >=1.030 (A) 11/16/2019 1613   PHURINE 5.5 11/16/2019 1613   GLUCOSEU NEGATIVE 11/16/2019 1613   HGBUR NEGATIVE 11/16/2019 1613   HGBUR trace-lysed 12/20/2009 1107   BILIRUBINUR NEGATIVE 11/16/2019 1613   BILIRUBINUR small 09/07/2019 1442   BILIRUBINUR Negative 01/01/2017 1403   KETONESUR TRACE (A) 11/16/2019 1613   PROTEINUR Negative 09/07/2019 1442   PROTEINUR 1+ (A) 01/01/2017 1403   PROTEINUR NEGATIVE 07/12/2010 0738   UROBILINOGEN 0.2 11/16/2019 1613   NITRITE NEGATIVE 11/16/2019 1613   LEUKOCYTESUR SMALL (A) 11/16/2019 1613   Sepsis Labs: @LABRCNTIP (procalcitonin:4,lacticidven:4) )No results found for this or any previous visit (from the past 240 hour(s)).   Radiological Exams on Admission: DG Chest 2 View  Result Date: 03/01/2020 CLINICAL DATA:  Shortness breath and hypoxia EXAM: CHEST - 2 VIEW COMPARISON:  February 12, 2020 FINDINGS: The heart size and mediastinal contours are within normal limits. Streaky atelectasis seen within the right mid lung and left lung base. No large airspace consolidation or pleural effusion. There is prominence of the central pulmonary  vasculature. The visualized skeletal structures are unremarkable. IMPRESSION: Mild pulmonary vascular congestion. Electronically Signed   By: Prudencio Pair M.D.   On: 03/01/2020 03:19  EKG: I have personally reviewed.  Sinus rhythm, QTC 471, LAE, early R wave progression, nonspecific T wave change    Assessment/Plan Principal Problem:   Acute hypoxemic respiratory failure due to COVID-19 [facility]) Active Problems:   Hypothyroidism   Depression with anxiety   Essential hypertension, benign   OSA (obstructive sleep apnea)   Asthma, mild persistent   Hyperlipidemia with target LDL less than 100   Paroxysmal atrial fibrillation (HCC)   Pulmonary fibrosis (HCC)   Diabetes mellitus without complication ([place])   Acute hypoxemic respiratory failure due to COVID-19 [facility]): Patient has oxygen desaturated to 82% on room air.  Chest x-ray showed mild vascular congestion without obvious effusion.  -will admit to med-surg bed as inpt -Remdesivir per pharm -Solumedrol 40 mg bid -vitamin C, zinc.  -Bronchodilators -PRN Mucinex for cough -f/u Blood culture -D-dimer, BNP,Trop, LFT, CRP, LDH, Procalcitonin, Ferritin, fibinogen, TG, Hep B SAg, HIV ab -Daily CRP, Ferritin, D-dimer, -Will ask the patient to maintain an awake prone position for 16+ hours a day, if possible, with a minimum of 2-3 hours at a time -Will attempt to maintain euvolemia to a net negative fluid status -IF patient deteriorates, will consult PCCM and ID  Hypothyroidism -Synthroid  Depression with anxiety -Continue home medications  Essential hypertension, benign -IV hydralazine as needed -Continue home HCTZ, metoprolol  OSA (obstructive sleep apnea) -CPAP  Asthma, mild persistent -Bronchodilators  Hyperlipidemia with target LDL less than 100 -Lipitor  Paroxysmal atrial fibrillation (HCC): Heart rate is well controlled, 65 -Continue Eliquis and metoprolol  Pulmonary fibrosis (HCC) -Bronchodilators  Diabetes  mellitus without complication ([place]): Recent A1c 7.9, poorly controlled patient is taking metformin at home -Sliding scale insulin          DVT ppx: on Eliquis Code Status: Full code Family Communication:  Yes, patient's daughter Disposition Plan:  Anticipate discharge back to previous environment Consults called: None Admission status: Med-surg bed as inpt      Status is: Inpatient  Remains inpatient appropriate because:Inpatient level of care appropriate due to severity of illness.  Patient has multiple comorbidities, now presents with acute hypoxemic respiratory failure due to COVID-19 infection.  Patient has new oxygen requirement.  Her presentation is highly complicated.  She is at high risk of deteriorating.  Will need to be treated in hospital for at least 2 days   Dispo: The patient is from: Home              Anticipated d/c is to: Home              Anticipated d/c date is: 2 days              Patient currently is not medically stable to d/c.          Date of Service 03/01/2020    Ivor Costa [facility]   If 7PM-7AM, please contact night-coverage [email or web address] 03/01/2020, 10:04 AM

## 2020-03-02 ENCOUNTER — Encounter: Payer: Self-pay | Admitting: Internal Medicine

## 2020-03-02 DIAGNOSIS — F32A Depression, unspecified: Secondary | ICD-10-CM

## 2020-03-02 DIAGNOSIS — F419 Anxiety disorder, unspecified: Secondary | ICD-10-CM

## 2020-03-02 LAB — BLOOD CULTURE ID PANEL (REFLEXED) - BCID2

## 2020-03-02 LAB — CBC WITH DIFFERENTIAL/PLATELET
Abs Immature Granulocytes: 0.03 10*3/uL (ref 0.00–0.07)
Basophils Absolute: 0 10*3/uL (ref 0.0–0.1)
Basophils Relative: 0 %
Eosinophils Absolute: 0 10*3/uL (ref 0.0–0.5)
Eosinophils Relative: 0 %
HCT: 38.8 % (ref 36.0–46.0)
Hemoglobin: 12.5 g/dL (ref 12.0–15.0)
Immature Granulocytes: 0 %
Lymphocytes Relative: 18 %
Lymphs Abs: 1.5 10*3/uL (ref 0.7–4.0)
MCH: 28.9 pg (ref 26.0–34.0)
MCHC: 32.2 g/dL (ref 30.0–36.0)
MCV: 89.6 fL (ref 80.0–100.0)
Monocytes Absolute: 0.3 10*3/uL (ref 0.1–1.0)
Monocytes Relative: 4 %
Neutro Abs: 6.7 10*3/uL (ref 1.7–7.7)
Neutrophils Relative %: 78 %
Platelets: 231 10*3/uL (ref 150–400)
RBC: 4.33 MIL/uL (ref 3.87–5.11)
RDW: 12.4 % (ref 11.5–15.5)
WBC: 8.6 10*3/uL (ref 4.0–10.5)
nRBC: 0 % (ref 0.0–0.2)

## 2020-03-02 LAB — COMPREHENSIVE METABOLIC PANEL
ALT: 395 U/L — ABNORMAL HIGH (ref 0–44)
AST: 335 U/L — ABNORMAL HIGH (ref 15–41)
Albumin: 3.7 g/dL (ref 3.5–5.0)
Alkaline Phosphatase: 74 U/L (ref 38–126)
Anion gap: 10 (ref 5–15)
BUN: 17 mg/dL (ref 8–23)
CO2: 32 mmol/L (ref 22–32)
Calcium: 10 mg/dL (ref 8.9–10.3)
Chloride: 99 mmol/L (ref 98–111)
Creatinine, Ser: 0.67 mg/dL (ref 0.44–1.00)
GFR, Estimated: >60 mL/min (ref >60)
Glucose, Bld: 251 mg/dL — ABNORMAL HIGH (ref 70–99)
Potassium: 3.8 mmol/L (ref 3.5–5.1)
Sodium: 141 mmol/L (ref 135–145)
Total Bilirubin: 0.5 mg/dL (ref 0.3–1.2)
Total Protein: 6.7 g/dL (ref 6.5–8.1)

## 2020-03-02 LAB — GLUCOSE, CAPILLARY
Glucose-Capillary: 211 mg/dL — ABNORMAL HIGH (ref 70–99)
Glucose-Capillary: 272 mg/dL — ABNORMAL HIGH (ref 70–99)
Glucose-Capillary: 263 mg/dL — ABNORMAL HIGH (ref 70–99)
Glucose-Capillary: 139 mg/dL — ABNORMAL HIGH (ref 70–99)

## 2020-03-02 LAB — FERRITIN: Ferritin: 129 ng/mL (ref 11–307)

## 2020-03-02 LAB — MAGNESIUM: Magnesium: 2.1 mg/dL (ref 1.7–2.4)

## 2020-03-02 LAB — FIBRIN DERIVATIVES D-DIMER (ARMC ONLY): Fibrin derivatives D-dimer (ARMC): 237.94 ng/mL (FEU) (ref 0.00–499.00)

## 2020-03-02 LAB — C-REACTIVE PROTEIN: CRP: 0.6 mg/dL (ref <1.0)

## 2020-03-02 MED ORDER — ALPRAZOLAM 1 MG PO TABS: 1.0000 mg | ORAL_TABLET | Freq: Three times a day (TID) | ORAL | Status: DC | PRN

## 2020-03-02 MED ORDER — METHYLPREDNISOLONE SODIUM SUCC 40 MG IJ SOLR
40.0000 mg | Freq: Every day | INTRAMUSCULAR | Status: DC
  Administered 2020-03-03 – 2020-03-05 (×3): 40 mg via INTRAVENOUS
  Filled 2020-03-02 (×4): qty 1

## 2020-03-02 MED ORDER — ALPRAZOLAM 1 MG PO TABS
1.0000 mg | ORAL_TABLET | Freq: Three times a day (TID) | ORAL | Status: DC
  Administered 2020-03-02 – 2020-03-05 (×9): 1 mg via ORAL
  Filled 2020-03-02 (×9): qty 1

## 2020-03-02 MED ORDER — INSULIN ASPART 100 UNIT/ML ~~LOC~~ SOLN
3.0000 [IU] | Freq: Three times a day (TID) | SUBCUTANEOUS | Status: DC
  Administered 2020-03-02 – 2020-03-04 (×5): 3 [IU] via SUBCUTANEOUS
  Filled 2020-03-02 (×4): qty 1

## 2020-03-02 MED ORDER — INSULIN GLARGINE 100 UNIT/ML ~~LOC~~ SOLN
5.0000 [IU] | Freq: Every day | SUBCUTANEOUS | Status: DC
  Administered 2020-03-02 – 2020-03-04 (×3): 5 [IU] via SUBCUTANEOUS
  Filled 2020-03-02 (×4): qty 0.05

## 2020-03-02 MED ORDER — SODIUM CHLORIDE 0.9 % IV SOLN
INTRAVENOUS | Status: DC | PRN
  Administered 2020-03-02: 08:00:00 250 mL via INTRAVENOUS

## 2020-03-02 NOTE — Progress Notes (Addendum)
1        [facility] [place] [name]    MR#:  [id number]  DATE OF BIRTH:  1955-03-14  SUBJECTIVE:  CHIEF COMPLAINT:   Chief Complaint  Patient presents with  . Shortness of Breath  Feels anxious.  Requesting Xanax as scheduled.  She takes this at home.  She found out this morning that her husband's not doing well and getting tested for COVID as well REVIEW OF SYSTEMS:  Review of Systems  Constitutional: Positive for malaise/fatigue. Negative for diaphoresis, fever and weight loss.  HENT: Negative for ear discharge, ear pain, hearing loss, nosebleeds, sore throat and tinnitus.   Eyes: Negative for blurred vision and pain.  Respiratory: Positive for cough and shortness of breath. Negative for hemoptysis and wheezing.   Cardiovascular: Negative for chest pain, palpitations, orthopnea and leg swelling.  Gastrointestinal: Negative for abdominal pain, blood in stool, constipation, diarrhea, heartburn, nausea and vomiting.  Genitourinary: Negative for dysuria, frequency and urgency.  Musculoskeletal: Positive for myalgias. Negative for back pain.  Skin: Negative for itching and rash.  Neurological: Negative for dizziness, tingling, tremors, focal weakness, seizures, weakness and headaches.  Psychiatric/Behavioral: Negative for depression. The patient is nervous/anxious.    DRUG ALLERGIES:   Allergies  Allergen Reactions  . Factive [Gemifloxacin Mesylate] Rash  . Crestor [Rosuvastatin]     GI upset  . Sulfonamide Derivatives     REACTION: rash  . Gemifloxacin Rash  . Penicillins Hives and Rash    Has patient had a PCN reaction causing immediate rash, facial/tongue/throat swelling, SOB or lightheadedness with hypotension: No Has patient had a PCN reaction causing severe rash involving mucus membranes or skin necrosis: No Has patient had a PCN reaction that required hospitalization No Has patient had a PCN reaction occurring within the last 10  years: No If all of the above answers are "NO", then may proceed with Cephalosporin use.    VITALS:  Blood pressure 133/88, pulse 71, temperature 98 F (36.7 C), temperature source Oral, resp. rate 18, height 5' 7"  (1.702 m), weight 94.6 kg, SpO2 98 %. PHYSICAL EXAMINATION:  Physical Exam HENT:     Head: Normocephalic and atraumatic.  Eyes:     Extraocular Movements: EOM normal.     Conjunctiva/sclera: Conjunctivae normal.     Pupils: Pupils are equal, round, and reactive to light.  Neck:     Thyroid: No thyromegaly.     Trachea: No tracheal deviation.  Cardiovascular:     Rate and Rhythm: Normal rate and regular rhythm.     Heart sounds: Normal heart sounds.  Pulmonary:     Effort: Pulmonary effort is normal. No respiratory distress.     Breath sounds: Normal breath sounds. No wheezing.  Chest:     Chest wall: No tenderness.  Abdominal:     General: Bowel sounds are normal. There is no distension.     Palpations: Abdomen is soft.     Tenderness: There is no abdominal tenderness.  Musculoskeletal:        General: Normal range of motion.     Cervical back: Normal range of motion and neck supple.  Skin:    General: Skin is warm and dry.     Findings: No rash.  Neurological:     Mental Status: She is alert and oriented to person, place, and time.     Cranial Nerves: No cranial nerve deficit.  Psychiatric:        Mood  and Affect: Mood is anxious.    LABORATORY PANEL:  Female CBC Recent Labs  Lab 03/02/20 0414  WBC 8.6  HGB 12.5  HCT 38.8  PLT 231   ------------------------------------------------------------------------------------------------------------------ Chemistries  Recent Labs  Lab 03/02/20 0414  NA 141  K 3.8  CL 99  CO2 32  GLUCOSE 251*  BUN 17  CREATININE 0.67  CALCIUM 10.0  MG 2.1  AST 335*  ALT 395*  ALKPHOS 74  BILITOT 0.5   RADIOLOGY:  No results found. ASSESSMENT AND PLAN:  65 year old female with a known history of hypertension,  hyperlipidemia, diabetes, asthma, GERD, OSA on CPAP, hypothyroidism, pulmonary fibrosis, IBS, A. fib on Eliquis, anxiety is admitted for acute hypoxic respiratory failure due to COVID-19  Acute hypoxic respiratory failure due to COVID-19 with underlying pulmonary fibrosis She was 82% on room air on admission Remdesivir day 2/5  Cut back on IV Solu-Medrol from 40 mg IV twice daily to once daily considering anxiety issue.  Her breathing is improving and so is her oxygen saturations.  She is 98% on 2 L oxygen now  Diabetes mellitus with recent A1c of 7.9 We will add insulin Lantus 5 units subcu daily and NovoLog 5 units subcu 3 times daily with meals Continue sliding scale insulin for now.  Anxiety and depression Continue Paxil.  We will add Xanax 1 mg p.o. 3 times daily per her home regimen  Hypertension Her blood pressure is on the lower end of normal with slow heart rate in 60s to 70s Will hold metoprolol for now.  Continue HCTZ  Hypothyroidism Normal TSH, continue Synthroid  1 out of 4 BCID positive for MSSA Likely false positive with normal procalcitonin, no leukocytosis.  Afebrile.  No other obvious source of bacterial infection Will monitor it for now, discussed with patient and pharmacist.  Hold off antibiotics  OSA CPAP  Mild persistent asthma Continue bronchodilators and steroids  Hyperlipidemia Lipitor  Paroxysmal A. Fib Heart rate controlled, continue Eliquis for anticoagulation    Body mass index is 32.66 kg/m.      Status is: Inpatient  Remains inpatient appropriate because:Inpatient level of care appropriate due to severity of illness   Dispo: The patient is from: Home              Anticipated d/c is to: Home              Anticipated d/c date is: 3 days              Patient currently is not medically stable to d/c.  Will need 3 more days of IV remdesivir before consideration for discharge.    DVT prophylaxis:        apixaban (ELIQUIS) tablet 5 mg      Family Communication: discussed with patient.  Her husband is already sick and getting tested for COVID   All the records are reviewed and case discussed with Care Management/Social Worker. Management plans discussed with the patient, family and they are in agreement.  CODE STATUS: Full Code  TOTAL TIME TAKING CARE OF THIS PATIENT: 35 minutes.   More than 50% of the time was spent in counseling/coordination of care: YES  POSSIBLE D/C IN 3 DAYS, DEPENDING ON CLINICAL CONDITION.   Max Sane M.D on 03/02/2020 at 1:08 PM  [facility]   CC: Primary care physician; [name], MD  Note: This dictation was prepared with Dragon dictation along with smaller phrase technology. Any transcriptional errors that result from this  process are unintentional.

## 2020-03-02 NOTE — Plan of Care (Signed)
  Problem: Education: Goal: Knowledge of General Education information will improve Description Including pain rating scale, medication(s)/side effects and non-pharmacologic comfort measures Outcome: Progressing   

## 2020-03-02 NOTE — Progress Notes (Signed)
PHARMACY - PHYSICIAN COMMUNICATION CRITICAL VALUE ALERT - BLOOD CULTURE IDENTIFICATION (BCID)  Kirsten [name] is an 65 y.o. female who presented to [facility] on 03/01/2020 with a chief complaint of shortness of breath  Assessment:  Blood cultures from 1/18 with 1 of 4 bottles with GPC, BCID = MSSE.  Admitted for [place]. No obvious source of infection  Name of physician (or Provider) Contacted: Dr Manuella Ghazi    Current antibiotics: None  Changes to prescribed antibiotics recommended:  Recommendations accepted by provider - suspect contaminant (monitor off antibiotics)  Results for orders placed or performed during the hospital encounter of 03/01/20  Blood Culture ID Panel (Reflexed) (Collected: 03/01/2020  2:30 PM)  Result Value Ref Range   Enterococcus faecalis NOT DETECTED NOT DETECTED   Enterococcus Faecium NOT DETECTED NOT DETECTED   Listeria monocytogenes NOT DETECTED NOT DETECTED   Staphylococcus species DETECTED (A) NOT DETECTED   Staphylococcus aureus (BCID) NOT DETECTED NOT DETECTED   Staphylococcus epidermidis DETECTED (A) NOT DETECTED   Staphylococcus lugdunensis NOT DETECTED NOT DETECTED   Streptococcus species NOT DETECTED NOT DETECTED   Streptococcus agalactiae NOT DETECTED NOT DETECTED   Streptococcus pneumoniae NOT DETECTED NOT DETECTED   Streptococcus pyogenes NOT DETECTED NOT DETECTED   A.calcoaceticus-baumannii NOT DETECTED NOT DETECTED   Bacteroides fragilis NOT DETECTED NOT DETECTED   Enterobacterales NOT DETECTED NOT DETECTED   Enterobacter cloacae complex NOT DETECTED NOT DETECTED   Escherichia coli NOT DETECTED NOT DETECTED   Klebsiella aerogenes NOT DETECTED NOT DETECTED   Klebsiella oxytoca NOT DETECTED NOT DETECTED   Klebsiella pneumoniae NOT DETECTED NOT DETECTED   Proteus species NOT DETECTED NOT DETECTED   Salmonella species NOT DETECTED NOT DETECTED   Serratia marcescens NOT DETECTED NOT DETECTED   Haemophilus influenzae NOT DETECTED NOT DETECTED    Neisseria meningitidis NOT DETECTED NOT DETECTED   Pseudomonas aeruginosa NOT DETECTED NOT DETECTED   Stenotrophomonas maltophilia NOT DETECTED NOT DETECTED   Candida albicans NOT DETECTED NOT DETECTED   Candida auris NOT DETECTED NOT DETECTED   Candida glabrata NOT DETECTED NOT DETECTED   Candida krusei NOT DETECTED NOT DETECTED   Candida parapsilosis NOT DETECTED NOT DETECTED   Candida tropicalis NOT DETECTED NOT DETECTED   Cryptococcus neoformans/gattii NOT DETECTED NOT DETECTED   Methicillin resistance mecA/C NOT DETECTED NOT DETECTED   Doreene Eland, PharmD, BCPS.   Work Cell: [phone] 03/02/2020 9:12 AM

## 2020-03-02 NOTE — Progress Notes (Signed)
Inpatient Diabetes Program Recommendations  AACE/ADA: New Consensus Statement on Inpatient Glycemic Control   Target Ranges:  Prepandial:   less than 140 mg/dL      Peak postprandial:   less than 180 mg/dL (1-2 hours)      Critically ill patients:  140 - 180 mg/dL  Results for MODEST, DRAEGER (MRN [id number]) as of 03/02/2020 12:23  Ref. Range 03/01/2020 08:41 03/01/2020 12:40 03/01/2020 17:29 03/01/2020 21:32 03/02/2020 07:25 03/02/2020 11:46  Glucose-Capillary Latest Ref Range: 70 - 99 mg/dL 209 (H) 290 (H) 195 (H) 266 (H) 211 (H) 272 (H)    Review of Glycemic Control  Diabetes history: DM2 Outpatient Diabetes medications: Actos 15 mg daily, Metformin 1000 mg BID, Rybelsus 3 mg daily Current orders for Inpatient glycemic control: Novolog 0-9 units TID with meals, Novolog 0-5 units QHS; Solumedrol 40 mg Q12H  Inpatient Diabetes Program Recommendations:    Insulin: If steroids are continued as ordered, please consider ordering Lantus 9 units Q24H and Novolog 3 units TID with meals for meal coverage if patient eats at least 50% of meals.  Thanks, Barnie Alderman, RN, MSN, CDE Diabetes Coordinator Inpatient Diabetes Program [phone] (Team Pager from 8am to 5pm)

## 2020-03-03 LAB — CBC WITH DIFFERENTIAL/PLATELET
Abs Immature Granulocytes: 0.08 10*3/uL — ABNORMAL HIGH (ref 0.00–0.07)
Basophils Absolute: 0 10*3/uL (ref 0.0–0.1)
Basophils Relative: 0 %
Eosinophils Absolute: 0 10*3/uL (ref 0.0–0.5)
Eosinophils Relative: 0 %
HCT: 39.8 % (ref 36.0–46.0)
Hemoglobin: 12.7 g/dL (ref 12.0–15.0)
Immature Granulocytes: 1 %
Lymphocytes Relative: 20 %
Lymphs Abs: 2.6 10*3/uL (ref 0.7–4.0)
MCH: 28.9 pg (ref 26.0–34.0)
MCHC: 31.9 g/dL (ref 30.0–36.0)
MCV: 90.5 fL (ref 80.0–100.0)
Monocytes Absolute: 0.6 10*3/uL (ref 0.1–1.0)
Monocytes Relative: 5 %
Neutro Abs: 9.5 10*3/uL — ABNORMAL HIGH (ref 1.7–7.7)
Neutrophils Relative %: 74 %
Platelets: 248 10*3/uL (ref 150–400)
RBC: 4.4 MIL/uL (ref 3.87–5.11)
RDW: 12.7 % (ref 11.5–15.5)
WBC: 12.9 10*3/uL — ABNORMAL HIGH (ref 4.0–10.5)
nRBC: 0 % (ref 0.0–0.2)

## 2020-03-03 LAB — COMPREHENSIVE METABOLIC PANEL
ALT: 322 U/L — ABNORMAL HIGH (ref 0–44)
AST: 165 U/L — ABNORMAL HIGH (ref 15–41)
Albumin: 3.4 g/dL — ABNORMAL LOW (ref 3.5–5.0)
Alkaline Phosphatase: 72 U/L (ref 38–126)
Anion gap: 6 (ref 5–15)
BUN: 22 mg/dL (ref 8–23)
CO2: 35 mmol/L — ABNORMAL HIGH (ref 22–32)
Calcium: 9.4 mg/dL (ref 8.9–10.3)
Chloride: 102 mmol/L (ref 98–111)
Creatinine, Ser: 0.7 mg/dL (ref 0.44–1.00)
GFR, Estimated: >60 mL/min (ref >60)
Glucose, Bld: 179 mg/dL — ABNORMAL HIGH (ref 70–99)
Potassium: 4 mmol/L (ref 3.5–5.1)
Sodium: 143 mmol/L (ref 135–145)
Total Bilirubin: 0.6 mg/dL (ref 0.3–1.2)
Total Protein: 6.3 g/dL — ABNORMAL LOW (ref 6.5–8.1)

## 2020-03-03 LAB — GLUCOSE, CAPILLARY
Glucose-Capillary: 132 mg/dL — ABNORMAL HIGH (ref 70–99)
Glucose-Capillary: 169 mg/dL — ABNORMAL HIGH (ref 70–99)
Glucose-Capillary: 216 mg/dL — ABNORMAL HIGH (ref 70–99)
Glucose-Capillary: 266 mg/dL — ABNORMAL HIGH (ref 70–99)
Glucose-Capillary: 225 mg/dL — ABNORMAL HIGH (ref 70–99)

## 2020-03-03 LAB — C-REACTIVE PROTEIN: CRP: 0.5 mg/dL (ref <1.0)

## 2020-03-03 LAB — FERRITIN: Ferritin: 73 ng/mL (ref 11–307)

## 2020-03-03 LAB — MAGNESIUM: Magnesium: 2 mg/dL (ref 1.7–2.4)

## 2020-03-03 LAB — D-DIMER, QUANTITATIVE: D-Dimer, Quant: <0.27 ug/mL-FEU (ref 0.00–0.50)

## 2020-03-03 NOTE — Progress Notes (Signed)
1        [name]    MR#:  [id number]  DATE OF BIRTH:  04-Feb-1956  SUBJECTIVE:  CHIEF COMPLAINT:   Chief Complaint  Patient presents with  . Shortness of Breath  Not feeling much better than yesterday.  Worried about her remaining blood culture. REVIEW OF SYSTEMS:  Review of Systems  Constitutional: Positive for malaise/fatigue. Negative for diaphoresis, fever and weight loss.  HENT: Negative for ear discharge, ear pain, hearing loss, nosebleeds, sore throat and tinnitus.   Eyes: Negative for blurred vision and pain.  Respiratory: Positive for cough and shortness of breath. Negative for hemoptysis and wheezing.   Cardiovascular: Negative for chest pain, palpitations, orthopnea and leg swelling.  Gastrointestinal: Negative for abdominal pain, blood in stool, constipation, diarrhea, heartburn, nausea and vomiting.  Genitourinary: Negative for dysuria, frequency and urgency.  Musculoskeletal: Positive for myalgias. Negative for back pain.  Skin: Negative for itching and rash.  Neurological: Negative for dizziness, tingling, tremors, focal weakness, seizures, weakness and headaches.  Psychiatric/Behavioral: Negative for depression. The patient is nervous/anxious.    DRUG ALLERGIES:   Allergies  Allergen Reactions  . Factive [Gemifloxacin Mesylate] Rash  . Crestor [Rosuvastatin]     GI upset  . Sulfonamide Derivatives     REACTION: rash  . Gemifloxacin Rash  . Penicillins Hives and Rash    Has patient had a PCN reaction causing immediate rash, facial/tongue/throat swelling, SOB or lightheadedness with hypotension: No Has patient had a PCN reaction causing severe rash involving mucus membranes or skin necrosis: No Has patient had a PCN reaction that required hospitalization No Has patient had a PCN reaction occurring within the last 10 years: No If all of the above answers are "NO", then may proceed with Cephalosporin  use.    VITALS:  Blood pressure (!) 125/57, pulse 63, temperature 98 F (36.7 C), temperature source Oral, resp. rate 18, height 5' 7"  (1.702 m), weight 94.6 kg, SpO2 93 %. PHYSICAL EXAMINATION:  Physical Exam HENT:     Head: Normocephalic and atraumatic.  Eyes:     Conjunctiva/sclera: Conjunctivae normal.     Pupils: Pupils are equal, round, and reactive to light.  Neck:     Thyroid: No thyromegaly.     Trachea: No tracheal deviation.  Cardiovascular:     Rate and Rhythm: Normal rate and regular rhythm.     Heart sounds: Normal heart sounds.  Pulmonary:     Effort: Pulmonary effort is normal. No respiratory distress.     Breath sounds: Decreased air movement present. Examination of the right-lower field reveals decreased breath sounds and rhonchi. Examination of the left-lower field reveals decreased breath sounds, wheezing and rhonchi. Decreased breath sounds, wheezing and rhonchi present.  Chest:     Chest wall: No tenderness.  Abdominal:     General: Bowel sounds are normal. There is no distension.     Palpations: Abdomen is soft.     Tenderness: There is no abdominal tenderness.  Musculoskeletal:        General: Normal range of motion.     Cervical back: Normal range of motion and neck supple.  Skin:    General: Skin is warm and dry.     Findings: No rash.  Neurological:     Mental Status: She is alert and oriented to person, place, and time.     Cranial Nerves: No cranial nerve deficit.  Psychiatric:  Mood and Affect: Mood is anxious.    LABORATORY PANEL:  Female CBC Recent Labs  Lab 03/03/20 0349  WBC 12.9*  HGB 12.7  HCT 39.8  PLT 248   ------------------------------------------------------------------------------------------------------------------ Chemistries  Recent Labs  Lab 03/03/20 0349  NA 143  K 4.0  CL 102  CO2 35*  GLUCOSE 179*  BUN 22  CREATININE 0.70  CALCIUM 9.4  MG 2.0  AST 165*  ALT 322*  ALKPHOS 72  BILITOT 0.6    RADIOLOGY:  No results found. ASSESSMENT AND PLAN:  65 year old female with a known history of hypertension, hyperlipidemia, diabetes, asthma, GERD, OSA on CPAP, hypothyroidism, pulmonary fibrosis, IBS, A. fib on Eliquis, anxiety is admitted for acute hypoxic respiratory failure due to COVID-19  Acute hypoxic respiratory failure due to COVID-19 with underlying pulmonary fibrosis She was 82% on room air on admission Remdesivir day 3/5  continue IV Solu-Medrol once daily for now.  Her breathing is improving and so is her oxygen saturations.  She is 93% on RA  Diabetes mellitus with recent A1c of 7.9 Continue insulin Lantus 5 units subcu daily and NovoLog 5 units subcu 3 times daily with meals Continue sliding scale insulin for now.  Anxiety and depression Continue Paxil and Xanax 1 mg p.o. 3 times daily per her home regimen  Elevated LFTs We will hold statin and amiodarone for now.  Monitor LFTs  Hypertension Her blood pressure is on the lower end of normal with slow heart rate in 60s to 70s Will hold metoprolol for now.  Continue HCTZ  Hypothyroidism Normal TSH, continue Synthroid  1 out of 4 BCID positive for MSSA Likely false positive with normal procalcitonin, no leukocytosis.  Afebrile.  No other obvious source of bacterial infection Will monitor it for now, discussed with patient and pharmacist.  Hold off antibiotics  OSA CPAP  Mild persistent asthma Continue bronchodilators and steroids  Hyperlipidemia Lipitor  Paroxysmal A. Fib Heart rate controlled, continue Eliquis for anticoagulation    Body mass index is 32.66 kg/m.      Status is: Inpatient  Remains inpatient appropriate because:Inpatient level of care appropriate due to severity of illness   Dispo: The patient is from: Home              Anticipated d/c is to: Home              Anticipated d/c date is: 3 days              Patient currently is not medically stable to d/c.  Will need 3 more days  of IV remdesivir before consideration for discharge.    DVT prophylaxis:        apixaban (ELIQUIS) tablet 5 mg     Family Communication: discussed with patient.    All the records are reviewed and case discussed with Care Management/Social Worker. Management plans discussed with the patient, nursing and they are in agreement.  CODE STATUS: Full Code  TOTAL TIME TAKING CARE OF THIS PATIENT: 35 minutes.   More than 50% of the time was spent in counseling/coordination of care: YES  POSSIBLE D/C IN 2 DAYS, DEPENDING ON CLINICAL CONDITION.   Max Sane M.D on 03/03/2020 at 2:07 PM  [facility]   CC: Primary care physician; [name], MD  Note: This dictation was prepared with Dragon dictation along with smaller phrase technology. Any transcriptional errors that result from this process are unintentional.

## 2020-03-04 ENCOUNTER — Encounter: Payer: Self-pay | Admitting: Internal Medicine

## 2020-03-04 LAB — GLUCOSE, CAPILLARY
Glucose-Capillary: 125 mg/dL — ABNORMAL HIGH (ref 70–99)
Glucose-Capillary: 209 mg/dL — ABNORMAL HIGH (ref 70–99)
Glucose-Capillary: 293 mg/dL — ABNORMAL HIGH (ref 70–99)
Glucose-Capillary: 193 mg/dL — ABNORMAL HIGH (ref 70–99)

## 2020-03-04 LAB — CBC WITH DIFFERENTIAL/PLATELET
Abs Immature Granulocytes: 0.08 10*3/uL — ABNORMAL HIGH (ref 0.00–0.07)
Basophils Absolute: 0 10*3/uL (ref 0.0–0.1)
Basophils Relative: 0 %
Eosinophils Absolute: 0 10*3/uL (ref 0.0–0.5)
Eosinophils Relative: 0 %
HCT: 41.1 % (ref 36.0–46.0)
Hemoglobin: 13.5 g/dL (ref 12.0–15.0)
Immature Granulocytes: 1 %
Lymphocytes Relative: 24 %
Lymphs Abs: 2.4 10*3/uL (ref 0.7–4.0)
MCH: 29.4 pg (ref 26.0–34.0)
MCHC: 32.8 g/dL (ref 30.0–36.0)
MCV: 89.5 fL (ref 80.0–100.0)
Monocytes Absolute: 0.6 10*3/uL (ref 0.1–1.0)
Monocytes Relative: 6 %
Neutro Abs: 7 10*3/uL (ref 1.7–7.7)
Neutrophils Relative %: 69 %
Platelets: 267 10*3/uL (ref 150–400)
RBC: 4.59 MIL/uL (ref 3.87–5.11)
RDW: 12.6 % (ref 11.5–15.5)
WBC: 10.1 10*3/uL (ref 4.0–10.5)
nRBC: 0 % (ref 0.0–0.2)

## 2020-03-04 LAB — COMPREHENSIVE METABOLIC PANEL
ALT: 272 U/L — ABNORMAL HIGH (ref 0–44)
AST: 103 U/L — ABNORMAL HIGH (ref 15–41)
Albumin: 3.4 g/dL — ABNORMAL LOW (ref 3.5–5.0)
Alkaline Phosphatase: 73 U/L (ref 38–126)
Anion gap: 10 (ref 5–15)
BUN: 25 mg/dL — ABNORMAL HIGH (ref 8–23)
CO2: 31 mmol/L (ref 22–32)
Calcium: 10 mg/dL (ref 8.9–10.3)
Chloride: 98 mmol/L (ref 98–111)
Creatinine, Ser: 0.67 mg/dL (ref 0.44–1.00)
GFR, Estimated: >60 mL/min (ref >60)
Glucose, Bld: 213 mg/dL — ABNORMAL HIGH (ref 70–99)
Potassium: 4.1 mmol/L (ref 3.5–5.1)
Sodium: 139 mmol/L (ref 135–145)
Total Bilirubin: 0.6 mg/dL (ref 0.3–1.2)
Total Protein: 6.5 g/dL (ref 6.5–8.1)

## 2020-03-04 LAB — CULTURE, BLOOD (ROUTINE X 2)

## 2020-03-04 LAB — C-REACTIVE PROTEIN: CRP: 0.5 mg/dL (ref <1.0)

## 2020-03-04 LAB — MAGNESIUM: Magnesium: 2 mg/dL (ref 1.7–2.4)

## 2020-03-04 LAB — FERRITIN: Ferritin: 66 ng/mL (ref 11–307)

## 2020-03-04 LAB — FIBRIN DERIVATIVES D-DIMER (ARMC ONLY): Fibrin derivatives D-dimer (ARMC): 138.39 ng/mL (FEU) (ref 0.00–499.00)

## 2020-03-04 MED ORDER — ZINC SULFATE 220 (50 ZN) MG PO CAPS: 220.0000 mg | ORAL_CAPSULE | Freq: Every day | ORAL | 0 refills | Status: AC

## 2020-03-04 MED ORDER — ASCORBIC ACID 500 MG PO TABS: 500.0000 mg | ORAL_TABLET | Freq: Every day | ORAL | 0 refills | Status: DC

## 2020-03-04 MED ORDER — METOPROLOL SUCCINATE ER 50 MG PO TB24
50.0000 mg | ORAL_TABLET | Freq: Every day | ORAL | Status: DC
Start: 2020-03-04 — End: 2020-03-05
  Administered 2020-03-04 – 2020-03-05 (×2): 50 mg via ORAL
  Filled 2020-03-04 (×2): qty 1

## 2020-03-04 MED ORDER — INSULIN ASPART 100 UNIT/ML ~~LOC~~ SOLN
4.0000 [IU] | Freq: Three times a day (TID) | SUBCUTANEOUS | Status: DC
  Administered 2020-03-04 (×2): 4 [IU] via SUBCUTANEOUS
  Filled 2020-03-04 (×2): qty 1

## 2020-03-04 NOTE — Care Management Important Message (Signed)
Important Message  Patient Details  [name] MRN: [id number] Date of Birth: 05/20/1955   Medicare Important Message Given:  Yes     Shelbie Hutching, RN 03/04/2020, 11:16 AM

## 2020-03-04 NOTE — Progress Notes (Signed)
1        [place] [facility] [place] [name]    MR#:  [id number]  DATE OF BIRTH:  April 07, 1955  SUBJECTIVE:  CHIEF COMPLAINT:   Chief Complaint  Patient presents with  . Shortness of Breath  Overall feeling much better.  Still worried about her blood cultures and I informed her it is likely false positive.  Was able to sleep well.  Still coughing REVIEW OF SYSTEMS:  Review of Systems  Constitutional: Positive for malaise/fatigue. Negative for diaphoresis, fever and weight loss.  HENT: Negative for ear discharge, ear pain, hearing loss, nosebleeds, sore throat and tinnitus.   Eyes: Negative for blurred vision and pain.  Respiratory: Positive for cough and shortness of breath. Negative for hemoptysis and wheezing.   Cardiovascular: Negative for chest pain, palpitations, orthopnea and leg swelling.  Gastrointestinal: Negative for abdominal pain, blood in stool, constipation, diarrhea, heartburn, nausea and vomiting.  Genitourinary: Negative for dysuria, frequency and urgency.  Musculoskeletal: Negative for back pain.  Skin: Negative for itching and rash.  Neurological: Negative for dizziness, tingling, tremors, focal weakness, seizures, weakness and headaches.  Psychiatric/Behavioral: Negative for depression. The patient is nervous/anxious.    DRUG ALLERGIES:   Allergies  Allergen Reactions  . Factive [Gemifloxacin Mesylate] Rash  . Crestor [Rosuvastatin]     GI upset  . Sulfonamide Derivatives     REACTION: rash  . Gemifloxacin Rash  . Penicillins Hives and Rash    Has patient had a PCN reaction causing immediate rash, facial/tongue/throat swelling, SOB or lightheadedness with hypotension: No Has patient had a PCN reaction causing severe rash involving mucus membranes or skin necrosis: No Has patient had a PCN reaction that required hospitalization No Has patient had a PCN reaction occurring within the last 10 years: No If all of the above answers  are "NO", then may proceed with Cephalosporin use.    VITALS:  Blood pressure (!) 152/74, pulse 76, temperature 97.7 F (36.5 C), temperature source Oral, resp. rate 20, height 5' 7"  (1.702 m), weight 94.6 kg, SpO2 95 %. PHYSICAL EXAMINATION:  Physical Exam HENT:     Head: Normocephalic and atraumatic.  Eyes:     Conjunctiva/sclera: Conjunctivae normal.     Pupils: Pupils are equal, round, and reactive to light.  Neck:     Thyroid: No thyromegaly.     Trachea: No tracheal deviation.  Cardiovascular:     Rate and Rhythm: Normal rate and regular rhythm.     Heart sounds: Normal heart sounds.  Pulmonary:     Effort: Pulmonary effort is normal. No respiratory distress.     Breath sounds: Decreased air movement present. Examination of the right-lower field reveals rhonchi. Examination of the left-lower field reveals rhonchi. Rhonchi present.  Chest:     Chest wall: No tenderness.  Abdominal:     General: Bowel sounds are normal. There is no distension.     Palpations: Abdomen is soft.     Tenderness: There is no abdominal tenderness.  Musculoskeletal:        General: Normal range of motion.     Cervical back: Normal range of motion and neck supple.  Skin:    General: Skin is warm and dry.     Findings: No rash.  Neurological:     Mental Status: She is alert and oriented to person, place, and time.     Cranial Nerves: No cranial nerve deficit.    LABORATORY PANEL:  Female CBC  Recent Labs  Lab 03/04/20 0406  WBC 10.1  HGB 13.5  HCT 41.1  PLT 267   ------------------------------------------------------------------------------------------------------------------ Chemistries  Recent Labs  Lab 03/04/20 0406  NA 139  K 4.1  CL 98  CO2 31  GLUCOSE 213*  BUN 25*  CREATININE 0.67  CALCIUM 10.0  MG 2.0  AST 103*  ALT 272*  ALKPHOS 73  BILITOT 0.6   RADIOLOGY:  No results found. ASSESSMENT AND PLAN:  65 year old female with a known history of hypertension,  hyperlipidemia, diabetes, asthma, GERD, OSA on CPAP, hypothyroidism, pulmonary fibrosis, IBS, A. fib on Eliquis, anxiety is admitted for acute hypoxic respiratory failure due to COVID-19  Acute hypoxic respiratory failure due to COVID-19 with underlying pulmonary fibrosis She was 82% on room air on admission Remdesivir day 4/5  continue IV Solu-Medrol once daily for now.  Her breathing is improving and so is her oxygen saturations.  She is 95 % on RA  Diabetes mellitus with recent A1c of 7.9 Continue insulin Lantus 5 units subcu daily and NovoLog 5 units subcu 3 times daily with meals Continue sliding scale insulin for now.  Anxiety and depression Continue Paxil and Xanax 1 mg p.o. 3 times daily per her home regimen  Elevated LFTs hold statin and amiodarone for now.  LFTs improving  Hypertension Blood pressure and heart rate improved now. Resume metoprolol for now.  Continue HCTZ  Hypothyroidism Normal TSH, continue Synthroid  1 out of 4 BCID positive for staph epidermidis False positive  OSA CPAP  Mild persistent asthma Continue bronchodilators and steroids  Hyperlipidemia Lipitor  Paroxysmal A. Fib Heart rate controlled, continue Eliquis for anticoagulation Resume metoprolol on 1/21  Obesity Body mass index is 32.66 kg/m.    Net IO Since Admission: 456.46 mL [03/04/20 1038]     Status is: Inpatient  Remains inpatient appropriate because:Inpatient level of care appropriate due to severity of illness   Dispo: The patient is from: Home              Anticipated d/c is to: Home              Anticipated d/c date is: 1 day              Patient currently is not medically stable to d/c.  Will need 1 more days of IV remdesivir before consideration for discharge.    DVT prophylaxis:        apixaban (ELIQUIS) tablet 5 mg     Family Communication: discussed with patient.    All the records are reviewed and case discussed with Care Management/Social  Worker. Management plans discussed with the patient, nursing and they are in agreement.  CODE STATUS: Full Code  TOTAL TIME TAKING CARE OF THIS PATIENT: 35 minutes.   More than 50% of the time was spent in counseling/coordination of care: YES  POSSIBLE D/C IN 1 DAYS, DEPENDING ON CLINICAL CONDITION.   Max Sane M.D on 03/04/2020 at 10:34 AM  [facility]   CC: Primary care physician; [name], MD  Note: This dictation was prepared with Dragon dictation along with smaller phrase technology. Any transcriptional errors that result from this process are unintentional.

## 2020-03-04 NOTE — Progress Notes (Signed)
Inpatient Diabetes Program Recommendations  AACE/ADA: New Consensus Statement on Inpatient Glycemic Control (2015)  Target Ranges:  Prepandial:   less than 140 mg/dL      Peak postprandial:   less than 180 mg/dL (1-2 hours)      Critically ill patients:  140 - 180 mg/dL   Lab Results  Component Value Date   GLUCAP 125 (H) 03/04/2020   HGBA1C 7.9 (H) 11/16/2019    Review of Glycemic Control Results for Kirsten [name], Kirsten [name] (MRN [id number]) as of 03/04/2020 10:40  Ref. Range 03/03/2020 07:41 03/03/2020 12:25 03/03/2020 13:24 03/03/2020 16:39 03/03/2020 21:14 03/04/2020 08:08  Glucose-Capillary Latest Ref Range: 70 - 99 mg/dL 132 (H) 169 (H) 216 (H) 266 (H) 225 (H) 125 (H)  Diabetes history: DM2 Outpatient Diabetes medications: Actos 15 mg daily, Metformin 1000 mg BID, Rybelsus 3 mg daily Current orders for Inpatient glycemic control: Novolog 0-9 units TID with meals, Novolog 0-5 units QHS; Novolog 3 units tid with meals, Lantus 5 units daily; Solumedrol IV 40 mg daily  Inpatient Diabetes Program Recommendations:   Consider increasing Novolog meal coverage to 4 units tid with meals.   Thanks,  Adah Perl, RN, BC-ADM Inpatient Diabetes Coordinator Pager [phone] (8a-5p)

## 2020-03-05 DIAGNOSIS — R0902 Hypoxemia: Secondary | ICD-10-CM

## 2020-03-05 LAB — CBC WITH DIFFERENTIAL/PLATELET
Abs Immature Granulocytes: 0.08 10*3/uL — ABNORMAL HIGH (ref 0.00–0.07)
Basophils Absolute: 0 10*3/uL (ref 0.0–0.1)
Basophils Relative: 0 %
Eosinophils Absolute: 0 10*3/uL (ref 0.0–0.5)
Eosinophils Relative: 0 %
HCT: 41.9 % (ref 36.0–46.0)
Hemoglobin: 13.6 g/dL (ref 12.0–15.0)
Immature Granulocytes: 1 %
Lymphocytes Relative: 29 %
Lymphs Abs: 2.7 10*3/uL (ref 0.7–4.0)
MCH: 28.8 pg (ref 26.0–34.0)
MCHC: 32.5 g/dL (ref 30.0–36.0)
MCV: 88.8 fL (ref 80.0–100.0)
Monocytes Absolute: 0.7 10*3/uL (ref 0.1–1.0)
Monocytes Relative: 7 %
Neutro Abs: 6.1 10*3/uL (ref 1.7–7.7)
Neutrophils Relative %: 63 %
Platelets: 285 10*3/uL (ref 150–400)
RBC: 4.72 MIL/uL (ref 3.87–5.11)
RDW: 12.5 % (ref 11.5–15.5)
WBC: 9.6 10*3/uL (ref 4.0–10.5)
nRBC: 0 % (ref 0.0–0.2)

## 2020-03-05 LAB — COMPREHENSIVE METABOLIC PANEL
ALT: 219 U/L — ABNORMAL HIGH (ref 0–44)
AST: 78 U/L — ABNORMAL HIGH (ref 15–41)
Albumin: 3.3 g/dL — ABNORMAL LOW (ref 3.5–5.0)
Alkaline Phosphatase: 73 U/L (ref 38–126)
Anion gap: 9 (ref 5–15)
BUN: 25 mg/dL — ABNORMAL HIGH (ref 8–23)
CO2: 32 mmol/L (ref 22–32)
Calcium: 10 mg/dL (ref 8.9–10.3)
Chloride: 100 mmol/L (ref 98–111)
Creatinine, Ser: 0.65 mg/dL (ref 0.44–1.00)
GFR, Estimated: >60 mL/min (ref >60)
Glucose, Bld: 191 mg/dL — ABNORMAL HIGH (ref 70–99)
Potassium: 4 mmol/L (ref 3.5–5.1)
Sodium: 141 mmol/L (ref 135–145)
Total Bilirubin: 0.6 mg/dL (ref 0.3–1.2)
Total Protein: 6.1 g/dL — ABNORMAL LOW (ref 6.5–8.1)

## 2020-03-05 LAB — MAGNESIUM: Magnesium: 2.1 mg/dL (ref 1.7–2.4)

## 2020-03-05 LAB — GLUCOSE, CAPILLARY: Glucose-Capillary: 109 mg/dL — ABNORMAL HIGH (ref 70–99)

## 2020-03-05 LAB — FERRITIN: Ferritin: 65 ng/mL (ref 11–307)

## 2020-03-05 LAB — C-REACTIVE PROTEIN: CRP: <0.5 mg/dL (ref <1.0)

## 2020-03-05 LAB — FIBRIN DERIVATIVES D-DIMER (ARMC ONLY): Fibrin derivatives D-dimer (ARMC): 118.93 ng/mL (FEU) (ref 0.00–499.00)

## 2020-03-05 MED ORDER — ATORVASTATIN CALCIUM 40 MG PO TABS: ORAL_TABLET | ORAL | 1 refills | Status: DC

## 2020-03-05 MED ORDER — AMIODARONE HCL 200 MG PO TABS: 200.0000 mg | ORAL_TABLET | Freq: Every day | ORAL | 1 refills | Status: DC

## 2020-03-05 NOTE — Discharge Summary (Signed)
[facility] [name]    MR#:  [id number]  DATE OF BIRTH:  11-03-1955  DATE OF ADMISSION:  03/01/2020   ADMITTING PHYSICIAN: Ivor Costa, MD  DATE OF DISCHARGE: 03/05/2020  1:25 PM  PRIMARY CARE PHYSICIAN: [name], MD   ADMISSION DIAGNOSIS:  Hypoxia [R09.02] Acute hypoxemic respiratory failure due to COVID-19 ([place]) [U07.1, J96.01] Pneumonia due to COVID-19 virus [U07.1, J12.82] Acute respiratory disease due to COVID-19 virus [U07.1, J06.9] DISCHARGE DIAGNOSIS:  Principal Problem:   Acute hypoxemic respiratory failure due to COVID-19 [facility]) Active Problems:   Hypothyroidism   Depression with anxiety   Essential hypertension, benign   OSA (obstructive sleep apnea)   Asthma, mild persistent   Hyperlipidemia with target LDL less than 100   Paroxysmal atrial fibrillation (HCC)   Pulmonary fibrosis ([place])   Diabetes mellitus without complication ([place])  SECONDARY DIAGNOSIS:   Past Medical History:  Diagnosis Date  . A-fib ([place])   . Anxiety disorder   . Asthma   . Dyspnea   . Gastroschisis    umphalocele, rotated organs abd repair until age 52  . GERD (gastroesophageal reflux disease)   . HTN (hypertension)   . Hyperlipidemia   . IBS (irritable bowel syndrome)   . Morbid obesity ([place])    Target wt - 185  for BMI < 30  . Obesity   . OSA on CPAP   . Pulmonary fibrosis ([place])   . SBO (small bowel obstruction) ([place])    Resolved with NG/Bowel rest around 2009  . Sleep apnea   . Type II or unspecified type diabetes mellitus without mention of complication, not stated as uncontrolled    HOSPITAL COURSE:  65 year old female with a known history of hypertension, hyperlipidemia, diabetes, asthma, GERD, OSA on CPAP, hypothyroidism, pulmonary fibrosis, IBS, A. fib on Eliquis, anxiety is admitted for acute hypoxic respiratory failure due to COVID-19  Acute hypoxic respiratory failure due to COVID-19 with underlying pulmonary  fibrosis She was 82% on room air on admission. Required 2 liters O2 Treated with remdesevir and steroids. Now on RA on the day of D/C  Diabetes mellitus with recent A1c of 7.9 Anxiety and depression Elevated LFTs hold statin and amiodarone while in the hospital and recommend holding over the weekend.  LFTs improving. Recommend f/up with PCP next week and recheck LFTs  Hypertension Hypothyroidism Normal TSH, continue Synthroid  1 out of 4 BCID positive for staph epidermidis False positive and skin contaminant  OSA CPAP  Mild persistent asthma Continue bronchodilators   Hyperlipidemia Hold Lipitor due to LFTs elevation  Paroxysmal A. Fib Heart rate controlled, continue Eliquis for anticoagulation  Obesity Body mass index is 32.66 kg/m.   DISCHARGE CONDITIONS:  stable CONSULTS OBTAINED:   DRUG ALLERGIES:   Allergies  Allergen Reactions  . Factive [Gemifloxacin Mesylate] Rash  . Crestor [Rosuvastatin]     GI upset  . Sulfonamide Derivatives     REACTION: rash  . Gemifloxacin Rash  . Penicillins Hives and Rash    Has patient had a PCN reaction causing immediate rash, facial/tongue/throat swelling, SOB or lightheadedness with hypotension: No Has patient had a PCN reaction causing severe rash involving mucus membranes or skin necrosis: No Has patient had a PCN reaction that required hospitalization No Has patient had a PCN reaction occurring within the last 10 years: No If all of the above answers are "NO", then may proceed with Cephalosporin use.    DISCHARGE MEDICATIONS:  Allergies as of 03/05/2020      Reactions   Factive [gemifloxacin Mesylate] Rash   Crestor [rosuvastatin]    GI upset   Sulfonamide Derivatives    REACTION: rash   Gemifloxacin Rash   Penicillins Hives, Rash   Has patient had a PCN reaction causing immediate rash, facial/tongue/throat swelling, SOB or lightheadedness with hypotension: No Has patient had a PCN reaction causing  severe rash involving mucus membranes or skin necrosis: No Has patient had a PCN reaction that required hospitalization No Has patient had a PCN reaction occurring within the last 10 years: No If all of the above answers are "NO", then may proceed with Cephalosporin use.      Medication List    STOP taking these medications   ondansetron 8 MG disintegrating tablet Commonly known as: Zofran ODT   pioglitazone 15 MG tablet Commonly known as: ACTOS   Rybelsus 3 MG Tabs Generic drug: Semaglutide     TAKE these medications   albuterol 108 (90 Base) MCG/ACT inhaler Commonly known as: VENTOLIN HFA Inhale 2 puffs into the lungs every 6 (six) hours as needed for wheezing or shortness of breath.   amiodarone 200 MG tablet Commonly known as: PACERONE Take 1 tablet (200 mg total) by mouth daily. Start taking on: March 07, 2020 What changed: These instructions start on March 07, 2020. If you are unsure what to do until then, ask your doctor or other care provider.   apixaban 5 MG Tabs tablet Commonly known as: Eliquis Take 1 tablet (5 mg total) by mouth 2 (two) times daily.   ascorbic acid 500 MG tablet Commonly known as: VITAMIN C Take 1 tablet (500 mg total) by mouth daily.   atorvastatin 40 MG tablet Commonly known as: LIPITOR TAKE 1 TABLET(40 MG) BY MOUTH DAILY Start taking on: March 07, 2020 What changed: These instructions start on March 07, 2020. If you are unsure what to do until then, ask your doctor or other care provider.   FreeStyle Libre 14 Day Reader Kerrin Mo 1 Act by Does not apply route daily.   FreeStyle Libre 14 Day Sensor Misc 1 Act by Does not apply route daily.   hydrochlorothiazide 12.5 MG capsule Commonly known as: Microzide Take 1 capsule (12.5 mg total) by mouth daily.   levothyroxine 100 MCG tablet Commonly known as: SYNTHROID Take 1 tablet (100 mcg total) by mouth daily before breakfast.   metFORMIN 1000 MG tablet Commonly known as:  GLUCOPHAGE One po BID   metoprolol succinate 50 MG 24 hr tablet Commonly known as: TOPROL-XL Take 1 tablet (50 mg total) by mouth daily.   PARoxetine 25 MG 24 hr tablet Commonly known as: PAXIL-CR TAKE 1 TABLET(25 MG) BY MOUTH DAILY   zinc sulfate 220 (50 Zn) MG capsule Take 1 capsule (220 mg total) by mouth daily.   zolpidem 10 MG tablet Commonly known as: AMBIEN TAKE 1 TABLET(10 MG) BY MOUTH AT BEDTIME AS NEEDED      DISCHARGE INSTRUCTIONS:   DIET:  Regular diet DISCHARGE CONDITION:  Stable ACTIVITY:  Activity as tolerated OXYGEN:  Home Oxygen: No.  Oxygen Delivery: room air DISCHARGE LOCATION:  home   If you experience worsening of your admission symptoms, develop shortness of breath, life threatening emergency, suicidal or homicidal thoughts you must seek medical attention immediately by calling 911 or calling your MD immediately  if symptoms less severe.  You Must read complete instructions/literature along with all the possible adverse reactions/side effects for all the Medicines  you take and that have been prescribed to you. Take any new Medicines after you have completely understood and accpet all the possible adverse reactions/side effects.   Please note  You were cared for by a hospitalist during your hospital stay. If you have any questions about your discharge medications or the care you received while you were in the hospital after you are discharged, you can call the unit and asked to speak with the hospitalist on call if the hospitalist that took care of you is not available. Once you are discharged, your primary care physician will handle any further medical issues. Please note that NO REFILLS for any discharge medications will be authorized once you are discharged, as it is imperative that you return to your primary care physician (or establish a relationship with a primary care physician if you do not have one) for your aftercare needs so that they can  reassess your need for medications and monitor your lab values.    On the day of Discharge:  VITAL SIGNS:  Blood pressure (!) 148/75, pulse 68, temperature (!) 97.5 F (36.4 C), temperature source Oral, resp. rate 16, height 5' 7"  (1.702 m), weight 94.6 kg, SpO2 94 %. PHYSICAL EXAMINATION:  GENERAL:  65 y.o.-year-old patient lying in the bed with no acute distress.  EYES: Pupils equal, round, reactive to light and accommodation. No scleral icterus. Extraocular muscles intact.  HEENT: Head atraumatic, normocephalic. Oropharynx and nasopharynx clear.  NECK:  Supple, no jugular venous distention. No thyroid enlargement, no tenderness.  LUNGS: Normal breath sounds bilaterally, no wheezing, rales,rhonchi or crepitation. No use of accessory muscles of respiration.  CARDIOVASCULAR: S1, S2 normal. No murmurs, rubs, or gallops.  ABDOMEN: Soft, non-tender, non-distended. Bowel sounds present. No organomegaly or mass.  EXTREMITIES: No pedal edema, cyanosis, or clubbing.  NEUROLOGIC: Cranial nerves II through XII are intact. Muscle strength 5/5 in all extremities. Sensation intact. Gait not checked.  PSYCHIATRIC: The patient is alert and oriented x 3.  SKIN: No obvious rash, lesion, or ulcer.  DATA REVIEW:   CBC Recent Labs  Lab 03/05/20 0410  WBC 9.6  HGB 13.6  HCT 41.9  PLT 285    Chemistries  Recent Labs  Lab 03/05/20 0410  NA 141  K 4.0  CL 100  CO2 32  GLUCOSE 191*  BUN 25*  CREATININE 0.65  CALCIUM 10.0  MG 2.1  AST 78*  ALT 219*  ALKPHOS 73  BILITOT 0.6     Outpatient follow-up   30 Day Unplanned Readmission Risk Score   Flowsheet Row ED to Hosp-Admission (Discharged) from 03/01/2020 in [facility] (1C)  30 Day Unplanned Readmission Risk Score (%) 17.02 Filed at 03/05/2020 1200     This score is the patient's risk of an unplanned readmission within 30 days of being discharged (0 -100%). The score is based on dignosis, age, lab data,  medications, orders, and past utilization.   Low:  0-14.9   Medium: 15-21.9   High: 22-29.9   Extreme: 30 and above         Management plans discussed with the patient, family and they are in agreement.  CODE STATUS: Full Code   TOTAL TIME TAKING CARE OF THIS PATIENT: 45 minutes.    Max Sane M.D on 03/05/2020 at 5:36 PM  [facility]   CC: Primary care physician; [name], MD   Note: This dictation was prepared with Dragon dictation along with smaller phrase technology. Any transcriptional errors  that result from this process are unintentional.

## 2020-03-06 LAB — CULTURE, BLOOD (ROUTINE X 2): Culture: NO GROWTH

## 2020-03-15 ENCOUNTER — Inpatient Hospital Stay: Payer: PPO | Admitting: Internal Medicine

## 2020-03-16 ENCOUNTER — Encounter: Payer: Self-pay | Admitting: Internal Medicine

## 2020-03-16 ENCOUNTER — Ambulatory Visit (INDEPENDENT_AMBULATORY_CARE_PROVIDER_SITE_OTHER): Payer: PPO

## 2020-03-16 ENCOUNTER — Ambulatory Visit (INDEPENDENT_AMBULATORY_CARE_PROVIDER_SITE_OTHER): Payer: PPO | Admitting: Internal Medicine

## 2020-03-16 ENCOUNTER — Other Ambulatory Visit: Payer: Self-pay

## 2020-03-16 VITALS — BP 132/88 | HR 86 | Temp 98.4°F | Ht 67.0 in | Wt 207.0 lb

## 2020-03-16 DIAGNOSIS — U071 COVID-19: Secondary | ICD-10-CM

## 2020-03-16 DIAGNOSIS — J0111 Acute recurrent frontal sinusitis: Secondary | ICD-10-CM | POA: Diagnosis not present

## 2020-03-16 DIAGNOSIS — J189 Pneumonia, unspecified organism: Secondary | ICD-10-CM | POA: Diagnosis not present

## 2020-03-16 DIAGNOSIS — I10 Essential (primary) hypertension: Secondary | ICD-10-CM

## 2020-03-16 DIAGNOSIS — E039 Hypothyroidism, unspecified: Secondary | ICD-10-CM

## 2020-03-16 DIAGNOSIS — J1282 Pneumonia due to coronavirus disease 2019: Secondary | ICD-10-CM

## 2020-03-16 DIAGNOSIS — R0602 Shortness of breath: Secondary | ICD-10-CM | POA: Diagnosis not present

## 2020-03-16 DIAGNOSIS — K7581 Nonalcoholic steatohepatitis (NASH): Secondary | ICD-10-CM | POA: Diagnosis not present

## 2020-03-16 DIAGNOSIS — E119 Type 2 diabetes mellitus without complications: Secondary | ICD-10-CM

## 2020-03-16 DIAGNOSIS — R059 Cough, unspecified: Secondary | ICD-10-CM | POA: Diagnosis not present

## 2020-03-16 DIAGNOSIS — J45909 Unspecified asthma, uncomplicated: Secondary | ICD-10-CM | POA: Diagnosis not present

## 2020-03-16 MED ORDER — CEFDINIR 300 MG PO CAPS: 300.0000 mg | ORAL_CAPSULE | Freq: Two times a day (BID) | ORAL | 0 refills | Status: AC

## 2020-03-16 NOTE — Progress Notes (Signed)
Subjective:  Patient ID: Kirsten [name], female    DOB: 02-26-1955  Age: 65 y.o. MRN: [id number]  CC: Atrial Fibrillation, URI, Sinusitis, Hypothyroidism, Diabetes, Hypertension, and Cough  This visit occurred during the SARS-CoV-2 public health emergency.  Safety protocols were in place, including screening questions prior to the visit, additional usage of staff PPE, and extensive cleaning of exam room while observing appropriate contact time as indicated for disinfecting solutions.    HPI Kirsten [name] presents for f/up   She was diagnosed with COVID-19 infection about 2 weeks ago. She was admitted for respiratory failure and treated with 5 days of IV remdesivir. She continues to have a mild nonproductive cough. She complains of facial pain and thick yellow nasal phlegm that is sometimes bloody.  Outpatient Medications Prior to Visit  Medication Sig Dispense Refill  . albuterol (VENTOLIN HFA) 108 (90 Base) MCG/ACT inhaler Inhale 2 puffs into the lungs every 6 (six) hours as needed for wheezing or shortness of breath. 18 g 2  . amiodarone (PACERONE) 200 MG tablet Take 1 tablet (200 mg total) by mouth daily. 90 tablet 1  . apixaban (ELIQUIS) 5 MG TABS tablet Take 1 tablet (5 mg total) by mouth 2 (two) times daily. 180 tablet 1  . ascorbic acid (VITAMIN C) 500 MG tablet Take 1 tablet (500 mg total) by mouth daily. 30 tablet 0  . atorvastatin (LIPITOR) 40 MG tablet TAKE 1 TABLET(40 MG) BY MOUTH DAILY 90 tablet 1  . Continuous Blood Gluc Receiver (FREESTYLE LIBRE 14 DAY READER) DEVI 1 Act by Does not apply route daily. 2 each 5  . Continuous Blood Gluc Sensor (FREESTYLE LIBRE 14 DAY SENSOR) MISC 1 Act by Does not apply route daily. 2 each 5  . hydrochlorothiazide (MICROZIDE) 12.5 MG capsule Take 1 capsule (12.5 mg total) by mouth daily. 90 capsule 1  . metFORMIN (GLUCOPHAGE) 1000 MG tablet One po BID 180 tablet 1  . metoprolol succinate (TOPROL-XL) 50 MG 24 hr tablet Take 1 tablet (50 mg  total) by mouth daily. 90 tablet 1  . PARoxetine (PAXIL-CR) 25 MG 24 hr tablet TAKE 1 TABLET(25 MG) BY MOUTH DAILY 90 tablet 1  . zinc sulfate 220 (50 Zn) MG capsule Take 1 capsule (220 mg total) by mouth daily. 30 capsule 0  . zolpidem (AMBIEN) 10 MG tablet TAKE 1 TABLET(10 MG) BY MOUTH AT BEDTIME AS NEEDED 90 tablet 1  . levothyroxine (SYNTHROID) 100 MCG tablet Take 1 tablet (100 mcg total) by mouth daily before breakfast. 90 tablet 1   No facility-administered medications prior to visit.    ROS Review of Systems  Constitutional: Negative for appetite change, chills, diaphoresis, fatigue, fever and unexpected weight change.  HENT: Positive for nosebleeds, rhinorrhea, sinus pressure and sinus pain. Negative for congestion, facial swelling, sore throat and trouble swallowing.   Eyes: Negative.   Respiratory: Positive for cough. Negative for chest tightness, shortness of breath and wheezing.   Cardiovascular: Negative for chest pain, palpitations and leg swelling.  Gastrointestinal: Negative for abdominal pain, blood in stool, constipation, diarrhea, nausea and vomiting.  Endocrine: Negative.   Genitourinary: Negative.  Negative for difficulty urinating.  Musculoskeletal: Negative.  Negative for arthralgias, back pain, myalgias and neck pain.  Skin: Negative.  Negative for color change and pallor.  Neurological: Negative.  Negative for dizziness, weakness and light-headedness.  Hematological: Negative for adenopathy. Does not bruise/bleed easily.  Psychiatric/Behavioral: Negative.     Objective:  BP 132/88   Pulse 86  Temp 98.4 F (36.9 C) (Oral)   Ht 5' 7"  (1.702 m)   Wt 207 lb (93.9 kg)   SpO2 90%   BMI 32.42 kg/m   BP Readings from Last 3 Encounters:  03/16/20 132/88  03/05/20 (!) 148/75  02/12/20 108/67    Wt Readings from Last 3 Encounters:  03/16/20 207 lb (93.9 kg)  03/02/20 208 lb 8.9 oz (94.6 kg)  02/12/20 210 lb (95.3 kg)    Physical Exam Vitals reviewed.   Constitutional:      General: She is not in acute distress.    Appearance: Normal appearance. She is not ill-appearing, toxic-appearing or diaphoretic.  HENT:     Nose: Rhinorrhea present. Rhinorrhea is purulent.     Right Nostril: No epistaxis.     Left Nostril: No epistaxis.     Right Sinus: Maxillary sinus tenderness and frontal sinus tenderness present.     Left Sinus: Maxillary sinus tenderness and frontal sinus tenderness present.     Mouth/Throat:     Mouth: Mucous membranes are moist.  Eyes:     General: No scleral icterus.    Conjunctiva/sclera: Conjunctivae normal.  Cardiovascular:     Rate and Rhythm: Normal rate and regular rhythm.     Heart sounds: No murmur heard. No gallop.   Pulmonary:     Effort: Pulmonary effort is normal.     Breath sounds: No stridor. No wheezing, rhonchi or rales.  Abdominal:     General: Abdomen is protuberant. Bowel sounds are normal. There is no distension.     Palpations: Abdomen is soft. There is no hepatomegaly, splenomegaly or mass.     Tenderness: There is no abdominal tenderness.  Musculoskeletal:        General: Normal range of motion.     Cervical back: Neck supple.     Right lower leg: No edema.     Left lower leg: No edema.  Lymphadenopathy:     Cervical: No cervical adenopathy.  Skin:    General: Skin is warm and dry.     Coloration: Skin is not pale.  Neurological:     General: No focal deficit present.     Mental Status: She is alert and oriented to person, place, and time.  Psychiatric:        Mood and Affect: Mood normal.        Behavior: Behavior normal.     Lab Results  Component Value Date   WBC 9.3 03/16/2020   HGB 13.2 03/16/2020   HCT 40.0 03/16/2020   PLT 237.0 03/16/2020   GLUCOSE 131 (H) 03/16/2020   CHOL 133 07/28/2019   TRIG 80 03/01/2020   HDL 44 07/28/2019   LDLDIRECT 63.0 01/15/2019   LDLCALC 29 07/28/2019   ALT 80 (H) 03/16/2020   AST 68 (H) 03/16/2020   NA 141 03/16/2020   K 3.9  03/16/2020   CL 100 03/16/2020   CREATININE 0.71 03/16/2020   BUN 12 03/16/2020   CO2 34 (H) 03/16/2020   TSH 4.60 (H) 03/16/2020   INR 1.4 (H) 06/02/2019   HGBA1C 7.5 (H) 03/16/2020   MICROALBUR 2.0 (H) 11/16/2019    DG Chest 2 View  Result Date: 03/01/2020 CLINICAL DATA:  Shortness breath and hypoxia EXAM: CHEST - 2 VIEW COMPARISON:  February 12, 2020 FINDINGS: The heart size and mediastinal contours are within normal limits. Streaky atelectasis seen within the right mid lung and left lung base. No large airspace consolidation or pleural effusion. There is  prominence of the central pulmonary vasculature. The visualized skeletal structures are unremarkable. IMPRESSION: Mild pulmonary vascular congestion. Electronically Signed   By: Prudencio Pair M.D.   On: 03/01/2020 03:19   No results found.   Assessment & Plan:   Briyanna was seen today for atrial fibrillation, uri, sinusitis, hypothyroidism, diabetes, hypertension and cough.  Diagnoses and all orders for this visit:  Diabetes mellitus without complication ([place])- Her blood sugar is adequately well controlled. -     Basic metabolic panel; Future -     Hemoglobin A1c; Future -     Hemoglobin A1c -     Basic metabolic panel  Essential hypertension, benign- Her blood pressure is adequately well controlled. -     CBC with Differential/Platelet; Future -     Basic metabolic panel; Future -     Basic metabolic panel -     CBC with Differential/Platelet  NASH (nonalcoholic steatohepatitis)- Her LFTs are still elevated but are somewhat improved. -     Hepatic function panel; Future -     Hepatic function panel  Hypothyroidism, unspecified type- Her thyroid dose is slightly low.  I recommended that she increase to 112 mcg of levothyroxine. -     TSH; Future -     TSH -     levothyroxine (SYNTHROID) 112 MCG tablet; Take 1 tablet (112 mcg total) by mouth daily.  Pneumonia due to COVID-19 virus -     DG Chest 2 View; Future -      cefdinir (OMNICEF) 300 MG capsule; Take 1 capsule (300 mg total) by mouth 2 (two) times daily for 10 days.  Acute recurrent frontal sinusitis -     cefdinir (OMNICEF) 300 MG capsule; Take 1 capsule (300 mg total) by mouth 2 (two) times daily for 10 days.   I have discontinued Mihika A. Puff's levothyroxine. I am also having her start on cefdinir and levothyroxine. Additionally, I am having her maintain her albuterol, apixaban, metoprolol succinate, FreeStyle Libre 14 Day Reader, FreeStyle Libre 14 Day Sensor, hydrochlorothiazide, metFORMIN, PARoxetine, zolpidem, zinc sulfate, ascorbic acid, amiodarone, and atorvastatin.  Meds ordered this encounter  Medications  . cefdinir (OMNICEF) 300 MG capsule    Sig: Take 1 capsule (300 mg total) by mouth 2 (two) times daily for 10 days.    Dispense:  20 capsule    Refill:  0  . levothyroxine (SYNTHROID) 112 MCG tablet    Sig: Take 1 tablet (112 mcg total) by mouth daily.    Dispense:  90 tablet    Refill:  0     Follow-up: Return in about 3 weeks (around 04/06/2020).  [name], MD

## 2020-03-16 NOTE — Patient Instructions (Signed)
Community-Acquired Pneumonia, Adult Pneumonia is an infection of the lungs. It causes irritation and swelling in the airways of the lungs. Mucus and fluid may also build up inside the airways. This may cause coughing and trouble breathing. One type of pneumonia can happen while you are in a hospital. A different type can happen when you are not in a hospital (community-acquired pneumonia). What are the causes? This condition is caused by germs (viruses, bacteria, or fungi). Some types of germs can spread from person to person. Pneumonia is not thought to spread from person to person.   What increases the risk? You are more likely to develop this condition if:  You have a long-term (chronic) disease, such as: ? Disease of the lungs. This may be chronic obstructive pulmonary disease (COPD) or asthma. ? Heart failure. ? Cystic fibrosis. ? Diabetes. ? Kidney disease. ? Sickle cell disease. ? HIV.  You have other health problems, such as: ? Your body's defense system (immune system) is weak. ? A condition that may cause you to breathe in fluids from your mouth and nose.  You had your spleen taken out.  You do not take good care of your teeth and mouth (poor dental hygiene).  You use or have used tobacco products.  You travel where the germs that cause this illness are common.  You are near certain animals or the places they live.  You are older than 63 years of age. What are the signs or symptoms? Symptoms of this condition include:  A cough.  A fever.  Sweating or chills.  Chest pain, often when you breathe deeply or cough.  Breathing problems, such as: ? Fast breathing. ? Trouble breathing. ? Shortness of breath.  Feeling tired (fatigued).  Muscle aches. How is this treated? Treatment for this condition depends on many things, such as:  The cause of your illness.  Your medicines.  Your other health problems. Most adults can be treated at home. Sometimes,  treatment must happen in a hospital.  Treatment may include medicines to kill germs.  Medicines may depend on which germ caused your illness. Very bad pneumonia is rare. If you get it, you may:  Have a machine to help you breathe.  Have fluid taken away from around your lungs. Follow these instructions at home: Medicines  Take over-the-counter and prescription medicines only as told by your doctor.  Take cough medicine only if you are losing sleep. Cough medicine can keep your body from taking mucus away from your lungs.  If you were prescribed an antibiotic medicine, take it as told by your doctor. Do not stop taking the antibiotic even if you start to feel better. Lifestyle  Do not drink alcohol.  Do not use any products that contain nicotine or tobacco, such as cigarettes, e-cigarettes, and chewing tobacco. If you need help quitting, ask your doctor.  Eat a healthy diet. This includes a lot of vegetables, fruits, whole grains, low-fat dairy products, and low-fat (lean) protein.      General instructions  Rest a lot. Sleep for at least 8 hours each night.  Sleep with your head and neck raised. Put a few pillows under your head or sleep in a reclining chair.  Return to your normal activities as told by your doctor. Ask your doctor what activities are safe for you.  Drink enough fluid to keep your pee (urine) pale yellow.  If your throat is sore, rinse your mouth often with salt water. To make salt   water, dissolve -1 tsp (3-6 g) of salt in 1 cup (237 mL) of warm water.  Keep all follow-up visits as told by your doctor. This is important.   How is this prevented? You can lower your risk of pneumonia by:  Getting the pneumonia shot (vaccine). These shots have different types and schedules. Ask your doctor what works best for you. Think about getting this shot if: ? You are older than 63 years of age. ? You are 19-65 years of age and:  You are being treated for  cancer.  You have long-term lung disease.  You have other problems that affect your body's defense system. Ask your doctor if you have one of these.  Getting your flu shot every year. Ask your doctor which type of shot is best for you.  Going to the dentist as often as told.  Washing your hands often with soap and water for at least 20 seconds. If you cannot use soap and water, use hand sanitizer. Contact a doctor if:  You have a fever.  You lose sleep because your cough medicine does not help. Get help right away if:  You are short of breath and this gets worse.  You have more chest pain.  Your sickness gets worse. This is very serious if: ? You are an older adult. ? Your body's defense system is weak.  You cough up blood. These symptoms may be an emergency. Do not wait to see if the symptoms will go away. Get medical help right away. Call your local emergency services (911 in the [place] Do not drive yourself to the hospital. Summary  Pneumonia is an infection of the lungs.  Community-acquired pneumonia affects people who have not been in the hospital. Certain germs can cause this infection.  This condition may be treated with medicines that kill germs.  For very bad pneumonia, you may need a hospital stay and treatment to help with breathing. This information is not intended to replace advice given to you by your health care provider. Make sure you discuss any questions you have with your health care provider. Document Revised: 11/11/2018 Document Reviewed: 11/11/2018 Elsevier Patient Education  2021 Elsevier Inc.  

## 2020-03-17 LAB — CBC WITH DIFFERENTIAL/PLATELET
Basophils Absolute: 0.1 10*3/uL (ref 0.0–0.1)
Basophils Relative: 1.1 % (ref 0.0–3.0)
Eosinophils Absolute: 0.2 10*3/uL (ref 0.0–0.7)
Eosinophils Relative: 1.8 % (ref 0.0–5.0)
HCT: 40 % (ref 36.0–46.0)
Hemoglobin: 13.2 g/dL (ref 12.0–15.0)
Lymphocytes Relative: 34.4 % (ref 12.0–46.0)
Lymphs Abs: 3.2 10*3/uL (ref 0.7–4.0)
MCHC: 33 g/dL (ref 30.0–36.0)
MCV: 87.6 fl (ref 78.0–100.0)
Monocytes Absolute: 0.7 10*3/uL (ref 0.1–1.0)
Monocytes Relative: 7.6 % (ref 3.0–12.0)
Neutro Abs: 5.1 10*3/uL (ref 1.4–7.7)
Neutrophils Relative %: 55.1 % (ref 43.0–77.0)
Platelets: 237 10*3/uL (ref 150.0–400.0)
RBC: 4.57 Mil/uL (ref 3.87–5.11)
RDW: 13.4 % (ref 11.5–15.5)
WBC: 9.3 10*3/uL (ref 4.0–10.5)

## 2020-03-17 LAB — BASIC METABOLIC PANEL
BUN: 12 mg/dL (ref 6–23)
CO2: 34 mEq/L — ABNORMAL HIGH (ref 19–32)
Calcium: 10.4 mg/dL (ref 8.4–10.5)
Chloride: 100 mEq/L (ref 96–112)
Creatinine, Ser: 0.71 mg/dL (ref 0.40–1.20)
GFR: 91 mL/min (ref >60.00)
Glucose, Bld: 131 mg/dL — ABNORMAL HIGH (ref 70–99)
Potassium: 3.9 mEq/L (ref 3.5–5.1)
Sodium: 141 mEq/L (ref 135–145)

## 2020-03-17 LAB — HEPATIC FUNCTION PANEL
ALT: 80 U/L — ABNORMAL HIGH (ref 0–35)
AST: 68 U/L — ABNORMAL HIGH (ref 0–37)
Albumin: 3.9 g/dL (ref 3.5–5.2)
Alkaline Phosphatase: 82 U/L (ref 39–117)
Bilirubin, Direct: 0.1 mg/dL (ref 0.0–0.3)
Total Bilirubin: 0.4 mg/dL (ref 0.2–1.2)
Total Protein: 6.6 g/dL (ref 6.0–8.3)

## 2020-03-17 LAB — TSH: TSH: 4.6 u[IU]/mL — ABNORMAL HIGH (ref 0.35–4.50)

## 2020-03-17 LAB — HEMOGLOBIN A1C: Hgb A1c MFr Bld: 7.5 % — ABNORMAL HIGH (ref 4.6–6.5)

## 2020-03-18 MED ORDER — LEVOTHYROXINE SODIUM 112 MCG PO TABS: 112.0000 ug | ORAL_TABLET | Freq: Every day | ORAL | 0 refills | Status: DC

## 2020-03-22 DIAGNOSIS — G4733 Obstructive sleep apnea (adult) (pediatric): Secondary | ICD-10-CM | POA: Diagnosis not present

## 2020-03-23 DIAGNOSIS — R0902 Hypoxemia: Secondary | ICD-10-CM

## 2020-04-18 DIAGNOSIS — G4733 Obstructive sleep apnea (adult) (pediatric): Secondary | ICD-10-CM | POA: Diagnosis not present

## 2020-05-03 DIAGNOSIS — M461 Sacroiliitis, not elsewhere classified: Secondary | ICD-10-CM | POA: Diagnosis not present

## 2020-05-23 ENCOUNTER — Other Ambulatory Visit: Payer: Self-pay | Admitting: Internal Medicine

## 2020-05-23 DIAGNOSIS — I480 Paroxysmal atrial fibrillation: Secondary | ICD-10-CM

## 2020-05-23 MED ORDER — ELIQUIS 5 MG PO TABS: 5.0000 mg | ORAL_TABLET | Freq: Two times a day (BID) | ORAL | 1 refills | Status: DC

## 2020-05-24 DIAGNOSIS — G4733 Obstructive sleep apnea (adult) (pediatric): Secondary | ICD-10-CM | POA: Diagnosis not present

## 2020-06-06 ENCOUNTER — Ambulatory Visit (INDEPENDENT_AMBULATORY_CARE_PROVIDER_SITE_OTHER): Payer: PPO | Admitting: Certified Nurse Midwife

## 2020-06-06 ENCOUNTER — Other Ambulatory Visit: Payer: Self-pay

## 2020-06-06 ENCOUNTER — Encounter: Payer: Self-pay | Admitting: Certified Nurse Midwife

## 2020-06-06 VITALS — BP 114/74 | HR 71 | Ht 67.0 in | Wt 202.6 lb

## 2020-06-06 DIAGNOSIS — R300 Dysuria: Secondary | ICD-10-CM | POA: Diagnosis not present

## 2020-06-06 LAB — POCT URINALYSIS DIPSTICK OB
Bilirubin, UA: NEGATIVE
Blood, UA: NEGATIVE
Glucose, UA: NEGATIVE
Ketones, UA: NEGATIVE
Leukocytes, UA: NEGATIVE
Nitrite, UA: NEGATIVE
POC,PROTEIN,UA: NEGATIVE
Spec Grav, UA: 1.01 (ref 1.010–1.025)
Urobilinogen, UA: 0.2 E.U./dL
pH, UA: 5 (ref 5.0–8.0)

## 2020-06-06 MED ORDER — NITROFURANTOIN MONOHYD MACRO 100 MG PO CAPS: 100.0000 mg | ORAL_CAPSULE | Freq: Two times a day (BID) | ORAL | 0 refills | Status: AC

## 2020-06-06 NOTE — Progress Notes (Signed)
Subjective:    Kirsten [name] is a 65 y.o. female who complains of burning with urination, dysuria, frequency and urgency for 7 days.  Patient also complains of this being a recurrent issue for her. Patient denies back pain, fever, vaginal discharge and or vaginal irritation.  Patient does have a history of recurrent UTI, and has seen urology in the past for treatment . She was placed on suppressive therapy. She state she has taken AZO over the counter that did help with the symptoms.   The following portions of the patient's history were reviewed and updated as appropriate: allergies, current medications, past family history, past medical history, past social history, past surgical history and problem list. Review of Systems Pertinent items are noted in HPI.    Objective:    BP 114/74   Pulse 71   Ht 5' 7"  (1.702 m)   Wt 202 lb 9.6 oz (91.9 kg)   BMI 31.73 kg/m  General: alert, appears stated age, fatigued and no distress  Abdomen: soft, non-tender, without masses or organomegaly and soft in the entire abdomen  Back: back muscles are full ROM, normal  GU: defer exam   Laboratory:  Urine dipstick shows negative for all components.   Micro exam: not done.    Assessment:    possible UTI    Plan: Plan:    1. Medications: nitrofurantoin 2. Maintain adequate hydration 3. Urine culture sent 4. Discussed follow up with urologist if urine culture negative given her history.   She verbalizes and agrees to plan of care.  Philip Aspen, CNM

## 2020-06-06 NOTE — Patient Instructions (Signed)
Urinary Tract Infection, Adult A urinary tract infection (UTI) is an infection of any part of the urinary tract. The urinary tract includes:  The kidneys.  The ureters.  The bladder.  The urethra. These organs make, store, and get rid of pee (urine) in the body. What are the causes? This infection is caused by germs (bacteria) in your genital area. These germs grow and cause swelling (inflammation) of your urinary tract. What increases the risk? The following factors may make you more likely to develop this condition:  Using a small, thin tube (catheter) to drain pee.  Not being able to control when you pee or poop (incontinence).  Being female. If you are female, these things can increase the risk: ? Using these methods to prevent pregnancy:  A medicine that kills sperm (spermicide).  A device that blocks sperm (diaphragm). ? Having low levels of a female hormone (estrogen). ? Being pregnant. You are more likely to develop this condition if:  You have genes that add to your risk.  You are sexually active.  You take antibiotic medicines.  You have trouble peeing because of: ? A prostate that is bigger than normal, if you are female. ? A blockage in the part of your body that drains pee from the bladder. ? A kidney stone. ? A nerve condition that affects your bladder. ? Not getting enough to drink. ? Not peeing often enough.  You have other conditions, such as: ? Diabetes. ? A weak disease-fighting system (immune system). ? Sickle cell disease. ? Gout. ? Injury of the spine. What are the signs or symptoms? Symptoms of this condition include:  Needing to pee right away.  Peeing small amounts often.  Pain or burning when peeing.  Blood in the pee.  Pee that smells bad or not like normal.  Trouble peeing.  Pee that is cloudy.  Fluid coming from the vagina, if you are female.  Pain in the belly or lower back. Other symptoms include:  Vomiting.  Not  feeling hungry.  Feeling mixed up (confused). This may be the first symptom in older adults.  Being tired and grouchy (irritable).  A fever.  Watery poop (diarrhea). How is this treated?  Taking antibiotic medicine.  Taking other medicines.  Drinking enough water. In some cases, you may need to see a specialist. Follow these instructions at home: Medicines  Take over-the-counter and prescription medicines only as told by your doctor.  If you were prescribed an antibiotic medicine, take it as told by your doctor. Do not stop taking it even if you start to feel better. General instructions  Make sure you: ? Pee until your bladder is empty. ? Do not hold pee for a long time. ? Empty your bladder after sex. ? Wipe from front to back after peeing or pooping if you are a female. Use each tissue one time when you wipe.  Drink enough fluid to keep your pee pale yellow.  Keep all follow-up visits.   Contact a doctor if:  You do not get better after 1-2 days.  Your symptoms go away and then come back. Get help right away if:  You have very bad back pain.  You have very bad pain in your lower belly.  You have a fever.  You have chills.  You feeling like you will vomit or you vomit. Summary  A urinary tract infection (UTI) is an infection of any part of the urinary tract.  This condition is caused by   germs in your genital area.  There are many risk factors for a UTI.  Treatment includes antibiotic medicines.  Drink enough fluid to keep your pee pale yellow. This information is not intended to replace advice given to you by your health care provider. Make sure you discuss any questions you have with your health care provider. Document Revised: 09/11/2019 Document Reviewed: 09/11/2019 Elsevier Patient Education  2021 Elsevier Inc.  

## 2020-06-07 DIAGNOSIS — I10 Essential (primary) hypertension: Secondary | ICD-10-CM | POA: Diagnosis not present

## 2020-06-07 DIAGNOSIS — I4891 Unspecified atrial fibrillation: Secondary | ICD-10-CM | POA: Diagnosis not present

## 2020-06-07 DIAGNOSIS — I2510 Atherosclerotic heart disease of native coronary artery without angina pectoris: Secondary | ICD-10-CM | POA: Diagnosis not present

## 2020-06-07 DIAGNOSIS — R0602 Shortness of breath: Secondary | ICD-10-CM | POA: Diagnosis not present

## 2020-06-08 ENCOUNTER — Other Ambulatory Visit: Payer: Self-pay | Admitting: Internal Medicine

## 2020-06-08 DIAGNOSIS — Z1231 Encounter for screening mammogram for malignant neoplasm of breast: Secondary | ICD-10-CM

## 2020-06-09 LAB — URINE CULTURE

## 2020-06-20 DIAGNOSIS — G4733 Obstructive sleep apnea (adult) (pediatric): Secondary | ICD-10-CM | POA: Diagnosis not present

## 2020-06-21 ENCOUNTER — Other Ambulatory Visit: Payer: Self-pay | Admitting: Internal Medicine

## 2020-06-21 DIAGNOSIS — E039 Hypothyroidism, unspecified: Secondary | ICD-10-CM

## 2020-06-21 MED ORDER — LEVOTHYROXINE SODIUM 112 MCG PO TABS
112.0000 ug | ORAL_TABLET | Freq: Every day | ORAL | 0 refills | Status: DC
Start: 2020-06-21 — End: 2020-07-20

## 2020-06-23 ENCOUNTER — Other Ambulatory Visit: Payer: Self-pay

## 2020-06-23 ENCOUNTER — Ambulatory Visit
Admission: RE | Admit: 2020-06-23 | Discharge: 2020-06-23 | Disposition: A | Discharge status: Home / Self Care | Payer: PPO | Source: Ambulatory Visit | Attending: Internal Medicine | Admitting: Internal Medicine

## 2020-06-23 DIAGNOSIS — Z1231 Encounter for screening mammogram for malignant neoplasm of breast: Secondary | ICD-10-CM

## 2020-07-08 ENCOUNTER — Other Ambulatory Visit: Payer: Self-pay

## 2020-07-08 ENCOUNTER — Ambulatory Visit
Admission: EM | Admit: 2020-07-08 | Discharge: 2020-07-08 | Disposition: A | Discharge status: Home / Self Care | Payer: PPO | Attending: Emergency Medicine | Admitting: Emergency Medicine

## 2020-07-08 DIAGNOSIS — H1033 Unspecified acute conjunctivitis, bilateral: Secondary | ICD-10-CM

## 2020-07-08 MED ORDER — POLYMYXIN B-TRIMETHOPRIM 10000-0.1 UNIT/ML-% OP SOLN: 1.0000 [drp] | Freq: Four times a day (QID) | OPHTHALMIC | 0 refills | Status: AC

## 2020-07-08 NOTE — ED Provider Notes (Signed)
Kirsten [name]    CSN: [id number] Arrival date & time: 07/08/20  0932      History   Chief Complaint Chief Complaint  Patient presents with  . Conjunctivitis    HPI Kirsten [name] is a 65 y.o. female.   Presents with bilateral eye redness, itching, crusting, drainage x3 days.  She also reports headache and light sensitivity.  Treatment attempted with OTC allergy eyedrops and Zyrtec.  No acute eye pain or changes in her vision.  No injury.  No fever, chills, or other symptoms.  Her medical history includes hypertension, morbid obesity, asthma, pulmonary fibrosis, atrial fibrillation, GERD, IBS, anxiety.  The history is provided by the patient and medical records.    Past Medical History:  Diagnosis Date  . A-fib ([place])   . Anxiety disorder   . Asthma   . Dyspnea   . Gastroschisis    umphalocele, rotated organs abd repair until age 41  . GERD (gastroesophageal reflux disease)   . HTN (hypertension)   . Hyperlipidemia   . IBS (irritable bowel syndrome)   . Morbid obesity ([place])    Target wt - 185  for BMI < 30  . Obesity   . OSA on CPAP   . Pulmonary fibrosis ([place])   . SBO (small bowel obstruction) ([place])    Resolved with NG/Bowel rest around 2009  . Sleep apnea   . Type II or unspecified type diabetes mellitus without mention of complication, not stated as uncontrolled     Patient Active Problem List   Diagnosis Date Noted  . Hypoxia   . Pneumonia due to COVID-19 virus   . Spinal stenosis, lumbar region with neurogenic claudication 12/31/2019  . DDD (degenerative disc disease), lumbar 08/11/2019  . Primary osteoarthritis of right hip 08/11/2019  . Urinary urgency 08/10/2019  . NASH (nonalcoholic steatohepatitis) 06/02/2019  . Diabetes mellitus without complication ([place]) 90/24/0973  . Colon cancer screening 12/25/2017  . Estrogen deficiency 12/25/2017  . Pulmonary fibrosis ([place])   . 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 Insomnia w/ sleep  apnea 03/21/2017  . Class 1 obesity with serious comorbidity and body mass index (BMI) of 34.0 to 34.9 in adult 01/31/2016  . Vaginal atrophy 01/13/2015  . Insomnia due to anxiety and fear 10/11/2014  . GAD (generalized anxiety disorder) 10/11/2014  . Paroxysmal atrial fibrillation ([place]) 04/08/2014  . Hyperlipidemia with target LDL less than 100 10/22/2012  . Asthma, mild persistent 04/06/2011  . Hypertriglyceridemia 01/29/2011  . Vitamin B 12 deficiency 06/28/2009  . Hypothyroidism 06/20/2009  . Depression with anxiety 06/20/2009  . Type II diabetes mellitus with manifestations ([place]) 04/08/2009  . Essential hypertension, benign 04/08/2009  . OSA (obstructive sleep apnea) 04/08/2009    Past Surgical History:  Procedure Laterality Date  . BREAST EXCISIONAL BIOPSY Left 03/19/2012   neg  . CESAREAN SECTION    . CHOLECYSTECTOMY  1992  . COLONOSCOPY  2011   2011-normal  . Newborn Surgery - GI - ORGANS OUTSIDE ABDOMEN    . Small Bowel Repair    . TUBAL LIGATION  1988    OB History    Gravida  3   Para  3   Term  3   Preterm      AB      Living  3     SAB      IAB      Ectopic      Multiple  Live Births  3            Home Medications    Prior to Admission medications   Medication Sig Start Date End Date Taking? Authorizing Provider  trimethoprim-polymyxin b (POLYTRIM) ophthalmic solution Place 1 drop into both eyes 4 (four) times daily for 7 days. 07/08/20 07/15/20 Yes Sharion Balloon, NP  albuterol (VENTOLIN HFA) 108 (90 Base) MCG/ACT inhaler Inhale 2 puffs into the lungs every 6 (six) hours as needed for wheezing or shortness of breath. 02/11/19   Biagio Borg, MD  amiodarone (PACERONE) 200 MG tablet Take 1 tablet (200 mg total) by mouth daily. 03/07/20   Max Sane, MD  apixaban (ELIQUIS) 5 MG TABS tablet Take 1 tablet (5 mg total) by mouth 2 (two) times daily. 05/23/20   [name], MD  ascorbic acid (VITAMIN C) 500 MG tablet Take 1 tablet (500 mg  total) by mouth daily. Patient not taking: Reported on 06/06/2020 03/05/20   Max Sane, MD  atorvastatin (LIPITOR) 40 MG tablet TAKE 1 TABLET(40 MG) BY MOUTH DAILY 03/07/20   Max Sane, MD  Continuous Blood Gluc Receiver (FREESTYLE LIBRE 14 DAY READER) DEVI 1 Act by Does not apply route daily. 11/16/19   [name], MD  Continuous Blood Gluc Sensor (FREESTYLE LIBRE 14 DAY SENSOR) MISC 1 Act by Does not apply route daily. 11/16/19   [name], MD  hydrochlorothiazide (MICROZIDE) 12.5 MG capsule Take 1 capsule (12.5 mg total) by mouth daily. 12/31/19   [name], MD  levothyroxine (SYNTHROID) 112 MCG tablet Take 1 tablet (112 mcg total) by mouth daily. 06/21/20   [name], MD  metFORMIN (GLUCOPHAGE) 1000 MG tablet One po BID 12/31/19   [name], MD  metoprolol succinate (TOPROL-XL) 50 MG 24 hr tablet Take 1 tablet (50 mg total) by mouth daily. 11/16/19   [name], MD  PARoxetine (PAXIL-CR) 25 MG 24 hr tablet TAKE 1 TABLET(25 MG) BY MOUTH DAILY 12/31/19   [name], MD  zolpidem (AMBIEN) 10 MG tablet TAKE 1 TABLET(10 MG) BY MOUTH AT BEDTIME AS NEEDED 12/31/19   [name], MD    Family History Family History  Problem Relation Age of Onset  . Lymphoma Mother   . Diabetes type II Father   . Colon cancer Father   . Diabetes type I Son   . Diabetes Maternal Grandmother   . Goiter Neg Hx   . Breast cancer Neg Hx   . Ovarian cancer Neg Hx     Social History Social History   Tobacco Use  . Smoking status: Never Smoker  . Smokeless tobacco: Never Used  . Tobacco comment: "tried as a teen"  Vaping Use  . Vaping Use: Never used  Substance Use Topics  . Alcohol use: Yes    Alcohol/week: 2.0 standard drinks    Types: 2 Glasses of wine per week    Comment: rare  . Drug use: No     Allergies   Factive [gemifloxacin mesylate], Crestor [rosuvastatin], Sulfonamide derivatives, Gemifloxacin, and Penicillins   Review of Systems Review of  Systems  Constitutional: Negative for chills and fever.  HENT: Negative for ear pain and sore throat.   Eyes: Positive for photophobia, discharge, redness and itching. Negative for pain and visual disturbance.  Respiratory: Negative for cough and shortness of breath.   Cardiovascular: Negative for chest pain and palpitations.  Gastrointestinal: Negative for abdominal pain and vomiting.  Skin: Negative  for color change and rash.  All other systems reviewed and are negative.    Physical Exam Triage Vital Signs ED Triage Vitals [07/08/20 0944]  Enc Vitals Group     BP      Pulse      Resp      Temp      Temp src      SpO2      Weight      Height      Head Circumference      Peak Flow      Pain Score 4     Pain Loc      Pain Edu?      Excl. in [place]?    No data found.  Updated Vital Signs BP (!) 112/54 (BP Location: Right Arm)   Pulse 68   Temp (!) 97.2 F (36.2 C) (Oral)   Resp 18   SpO2 94%   Visual Acuity Right Eye Distance: 20/30 Left Eye Distance: 20/40 Bilateral Distance: 20/40 (Pt wears glasses states next eye appt 06/07)  Right Eye Near:   Left Eye Near:    Bilateral Near:     Physical Exam Vitals and nursing note reviewed.  Constitutional:      General: She is not in acute distress.    Appearance: She is well-developed. She is not ill-appearing.  HENT:     Head: Normocephalic and atraumatic.     Right Ear: Tympanic membrane normal.     Left Ear: Tympanic membrane normal.     Nose: Nose normal.     Mouth/Throat:     Mouth: Mucous membranes are moist.     Pharynx: Oropharynx is clear.  Eyes:     General: Lids are normal. Vision grossly intact.     Extraocular Movements: Extraocular movements intact.     Conjunctiva/sclera:     Right eye: Right conjunctiva is injected.     Left eye: Left conjunctiva is injected.     Pupils: Pupils are equal, round, and reactive to light.  Cardiovascular:     Rate and Rhythm: Normal rate and regular rhythm.      Heart sounds: Normal heart sounds.  Pulmonary:     Effort: Pulmonary effort is normal. No respiratory distress.     Breath sounds: Normal breath sounds.  Abdominal:     Palpations: Abdomen is soft.     Tenderness: There is no abdominal tenderness.  Musculoskeletal:     Cervical back: Neck supple.  Skin:    General: Skin is warm and dry.  Neurological:     General: No focal deficit present.     Mental Status: She is alert.     Gait: Gait normal.  Psychiatric:        Mood and Affect: Mood normal.        Behavior: Behavior normal.      UC Treatments / Results  Labs (all labs ordered are listed, but only abnormal results are displayed) Labs Reviewed - No data to display  EKG   Radiology No results found.  Procedures Procedures (including critical care time)  Medications Ordered in UC Medications - No data to display  Initial Impression / Assessment and Plan / UC Course  I have reviewed the triage vital signs and the nursing notes.  Pertinent labs & imaging results that were available during my care of the patient were reviewed by me and considered in my medical decision making (see chart for details).   Acute bilateral conjunctivitis.  Treating with Polytrim eyedrops.  Instructed patient to follow-up with her eye care provider if her symptoms or not improving.  ED precautions discussed.  Education provided on conjunctivitis.  Patient agrees to plan of care.   Final Clinical Impressions(s) / UC Diagnoses   Final diagnoses:  Acute bacterial conjunctivitis of both eyes     Discharge Instructions     Use the antibiotic eyedrops as prescribed.    Follow-up with your eye doctor for a recheck in 1 to 2 days if your symptoms are not improving.    Go to the emergency department if you have acute eye pain, changes in your vision, or other concerning symptoms.       ED Prescriptions    Medication Sig Dispense Auth. Provider   trimethoprim-polymyxin b (POLYTRIM)  ophthalmic solution Place 1 drop into both eyes 4 (four) times daily for 7 days. 10 mL Sharion Balloon, NP     PDMP not reviewed this encounter.   Sharion Balloon, NP 07/08/20 1011

## 2020-07-08 NOTE — Discharge Instructions (Signed)
Use the antibiotic eyedrops as prescribed.    Follow-up with your eye doctor for a recheck in 1 to 2 days if your symptoms are not improving.    Go to the emergency department if you have acute eye pain, changes in your vision, or other concerning symptoms.

## 2020-07-08 NOTE — ED Triage Notes (Signed)
Patient presents to Urgent Care with complaints of possible pink eye in bilateral eyes, headache, light sensitivity x 3 days. Treating symptoms with zyrtec.   Denies fever.

## 2020-07-12 DIAGNOSIS — H1045 Other chronic allergic conjunctivitis: Secondary | ICD-10-CM | POA: Diagnosis not present

## 2020-07-19 ENCOUNTER — Encounter: Payer: Self-pay | Admitting: Internal Medicine

## 2020-07-19 ENCOUNTER — Telehealth: Payer: Self-pay | Admitting: Internal Medicine

## 2020-07-19 ENCOUNTER — Ambulatory Visit (INDEPENDENT_AMBULATORY_CARE_PROVIDER_SITE_OTHER): Payer: PPO | Admitting: Internal Medicine

## 2020-07-19 ENCOUNTER — Other Ambulatory Visit: Payer: Self-pay

## 2020-07-19 VITALS — BP 134/82 | HR 73 | Temp 98.3°F | Resp 16 | Ht 67.0 in | Wt 202.6 lb

## 2020-07-19 DIAGNOSIS — E538 Deficiency of other specified B group vitamins: Secondary | ICD-10-CM

## 2020-07-19 DIAGNOSIS — E785 Hyperlipidemia, unspecified: Secondary | ICD-10-CM

## 2020-07-19 DIAGNOSIS — E118 Type 2 diabetes mellitus with unspecified complications: Secondary | ICD-10-CM

## 2020-07-19 DIAGNOSIS — K7581 Nonalcoholic steatohepatitis (NASH): Secondary | ICD-10-CM | POA: Diagnosis not present

## 2020-07-19 DIAGNOSIS — R350 Frequency of micturition: Secondary | ICD-10-CM | POA: Diagnosis not present

## 2020-07-19 DIAGNOSIS — Z23 Encounter for immunization: Secondary | ICD-10-CM

## 2020-07-19 DIAGNOSIS — I10 Essential (primary) hypertension: Secondary | ICD-10-CM

## 2020-07-19 DIAGNOSIS — E039 Hypothyroidism, unspecified: Secondary | ICD-10-CM

## 2020-07-19 DIAGNOSIS — F418 Other specified anxiety disorders: Secondary | ICD-10-CM

## 2020-07-19 DIAGNOSIS — E119 Type 2 diabetes mellitus without complications: Secondary | ICD-10-CM

## 2020-07-19 LAB — TSH: TSH: 5.06 u[IU]/mL — ABNORMAL HIGH (ref 0.35–4.50)

## 2020-07-19 LAB — HEMOGLOBIN A1C: Hgb A1c MFr Bld: 6.7 % — ABNORMAL HIGH (ref 4.6–6.5)

## 2020-07-19 LAB — FOLATE: Folate: 8.3 ng/mL (ref >5.9)

## 2020-07-19 MED ORDER — FREESTYLE LIBRE 2 READER DEVI: 1.0000 | Freq: Every day | 5 refills | Status: DC

## 2020-07-19 MED ORDER — BOOSTRIX 5-2.5-18.5 LF-MCG/0.5 IM SUSP: 0.5000 mL | Freq: Once | INTRAMUSCULAR | 0 refills | Status: AC

## 2020-07-19 MED ORDER — FREESTYLE LIBRE 14 DAY SENSOR MISC: 1.0000 | Freq: Every day | 5 refills | Status: DC

## 2020-07-19 MED ORDER — CYANOCOBALAMIN 1000 MCG/ML IJ SOLN
1000.0000 ug | Freq: Once | INTRAMUSCULAR | Status: AC
  Administered 2020-07-19: 1000 ug via INTRAMUSCULAR

## 2020-07-19 MED ORDER — FREESTYLE LIBRE 2 SENSOR MISC: 1.0000 | Freq: Every day | 5 refills | Status: DC

## 2020-07-19 MED ORDER — FREESTYLE LIBRE 14 DAY READER DEVI: 1.0000 | Freq: Every day | 5 refills | Status: DC

## 2020-07-19 NOTE — Progress Notes (Signed)
Subjective:  Patient ID: Kirsten [name], female    DOB: 01-18-1956  Age: 65 y.o. MRN: [id number]  CC: Diabetes, Hypertension, Hyperlipidemia, and Hypothyroidism  This visit occurred during the SARS-CoV-2 public health emergency.  Safety protocols were in place, including screening questions prior to the visit, additional usage of staff PPE, and extensive cleaning of exam room while observing appropriate contact time as indicated for disinfecting solutions.    HPI Kirsten [name] presents for f/up -   She was recently treated for a UTI but continues to complain of urinary frequency and wants to know if the infection has resolved.  She also complains of hair loss.  She is active and denies any recent episodes of chest pain, shortness of breath, palpitations, edema, or fatigue.  Outpatient Medications Prior to Visit  Medication Sig Dispense Refill   albuterol (VENTOLIN HFA) 108 (90 Base) MCG/ACT inhaler Inhale 2 puffs into the lungs every 6 (six) hours as needed for wheezing or shortness of breath. 18 g 2   amiodarone (PACERONE) 200 MG tablet Take 1 tablet (200 mg total) by mouth daily. 90 tablet 1   apixaban (ELIQUIS) 5 MG TABS tablet Take 1 tablet (5 mg total) by mouth 2 (two) times daily. 180 tablet 1   ascorbic acid (VITAMIN C) 500 MG tablet Take 1 tablet (500 mg total) by mouth daily. 30 tablet 0   atorvastatin (LIPITOR) 40 MG tablet TAKE 1 TABLET(40 MG) BY MOUTH DAILY 90 tablet 1   metoprolol succinate (TOPROL-XL) 50 MG 24 hr tablet Take 1 tablet (50 mg total) by mouth daily. 90 tablet 1   zolpidem (AMBIEN) 10 MG tablet TAKE 1 TABLET(10 MG) BY MOUTH AT BEDTIME AS NEEDED 90 tablet 1   Continuous Blood Gluc Receiver (FREESTYLE LIBRE 14 DAY READER) DEVI 1 Act by Does not apply route daily. 2 each 5   Continuous Blood Gluc Sensor (FREESTYLE LIBRE 14 DAY SENSOR) MISC 1 Act by Does not apply route daily. 2 each 5   hydrochlorothiazide (MICROZIDE) 12.5 MG capsule Take 1 capsule (12.5 mg  total) by mouth daily. (Patient taking differently: Take 25 mg by mouth daily.) 90 capsule 1   levothyroxine (SYNTHROID) 112 MCG tablet Take 1 tablet (112 mcg total) by mouth daily. 90 tablet 0   metFORMIN (GLUCOPHAGE) 1000 MG tablet One po BID 180 tablet 1   PARoxetine (PAXIL-CR) 25 MG 24 hr tablet TAKE 1 TABLET(25 MG) BY MOUTH DAILY 90 tablet 1   hydrochlorothiazide (HYDRODIURIL) 25 MG tablet Take 1 tablet by mouth daily.     No facility-administered medications prior to visit.    ROS Review of Systems  Constitutional:  Negative for chills, diaphoresis, fatigue and fever.  HENT: Negative.    Eyes: Negative.   Respiratory:  Negative for cough, chest tightness, shortness of breath and wheezing.   Endocrine: Positive for polyuria.  Genitourinary:  Positive for frequency. Negative for decreased urine volume, difficulty urinating, dysuria, flank pain, hematuria and urgency.  Musculoskeletal: Negative.  Negative for arthralgias and myalgias.  Skin: Negative.   Neurological: Negative.  Negative for dizziness, weakness, light-headedness, numbness and headaches.  Hematological:  Negative for adenopathy. Does not bruise/bleed easily.  Psychiatric/Behavioral: Negative.  Negative for dysphoric mood and sleep disturbance. The patient is not nervous/anxious.    Objective:  BP 134/82 (BP Location: Right Arm, Patient Position: Sitting, Cuff Size: Large)   Pulse 73   Temp 98.3 F (36.8 C) (Oral)   Resp 16   Ht 5' 7"  (1.702  m)   Wt 202 lb 9.6 oz (91.9 kg)   SpO2 94%   BMI 31.73 kg/m   BP Readings from Last 3 Encounters:  07/19/20 134/82  07/08/20 (!) 112/54  06/06/20 114/74    Wt Readings from Last 3 Encounters:  07/19/20 202 lb 9.6 oz (91.9 kg)  06/06/20 202 lb 9.6 oz (91.9 kg)  03/16/20 207 lb (93.9 kg)    Physical Exam Vitals reviewed.  Constitutional:      Appearance: Normal appearance.  HENT:     Nose: Nose normal.     Mouth/Throat:     Mouth: Mucous membranes are moist.   Eyes:     General: No scleral icterus.    Conjunctiva/sclera: Conjunctivae normal.  Cardiovascular:     Rate and Rhythm: Normal rate and regular rhythm.     Heart sounds: No murmur heard. Pulmonary:     Effort: Pulmonary effort is normal.     Breath sounds: No stridor. No wheezing, rhonchi or rales.  Abdominal:     General: Abdomen is protuberant. Bowel sounds are normal. There is no distension.     Palpations: Abdomen is soft. There is no hepatomegaly, splenomegaly or mass.  Musculoskeletal:        General: Normal range of motion.     Cervical back: Neck supple.     Right lower leg: No edema.     Left lower leg: No edema.  Lymphadenopathy:     Cervical: No cervical adenopathy.  Skin:    General: Skin is warm and dry.  Neurological:     General: No focal deficit present.     Mental Status: She is alert.  Psychiatric:        Mood and Affect: Mood normal.        Behavior: Behavior normal.        Thought Content: Thought content normal.        Judgment: Judgment normal.    Lab Results  Component Value Date   WBC 9.3 03/16/2020   HGB 13.2 03/16/2020   HCT 40.0 03/16/2020   PLT 237.0 03/16/2020   GLUCOSE 95 07/19/2020   CHOL 120 07/19/2020   TRIG 131.0 07/19/2020   HDL 45.00 07/19/2020   LDLDIRECT 63.0 01/15/2019   LDLCALC 48 07/19/2020   ALT 54 (H) 07/19/2020   AST 70 (H) 07/19/2020   NA 140 07/19/2020   K 4.1 07/19/2020   CL 100 07/19/2020   CREATININE 0.81 07/19/2020   BUN 17 07/19/2020   CO2 27 07/19/2020   TSH 5.06 (H) 07/19/2020   INR 1.4 (H) 06/02/2019   HGBA1C 6.7 (H) 07/19/2020   MICROALBUR 2.0 (H) 11/16/2019    No results found.  Assessment & Plan:   Kirsten [name] was seen today for diabetes, hypertension, hyperlipidemia and hypothyroidism.  Diagnoses and all orders for this visit:  Essential hypertension, benign- Her blood pressure is adequately well controlled. -     Basic metabolic panel; Future -     Basic metabolic panel  NASH (nonalcoholic  steatohepatitis)- I recommended that she treat this with pioglitazone. -     Hepatic function panel; Future -     Hepatic function panel -     pioglitazone (ACTOS) 15 MG tablet; Take 1 tablet (15 mg total) by mouth daily.  Diabetes mellitus without complication ([place]) -     Hemoglobin A1c; Future -     Hemoglobin A1c  Hypothyroidism, unspecified type- Her TSH is mildly elevated and she is symptomatic.  I recommended  that she increase the dose of levothyroxine. -     TSH; Future -     TSH -     levothyroxine (SYNTHROID) 125 MCG tablet; Take 1 tablet (125 mcg total) by mouth daily.  Type II diabetes mellitus with manifestations ([place])- Her blood sugar is adequately well controlled. -     Discontinue: Continuous Blood Gluc Receiver (FREESTYLE LIBRE 14 DAY READER) DEVI; 1 Act by Does not apply route daily. -     Discontinue: Continuous Blood Gluc Sensor (FREESTYLE LIBRE 14 DAY SENSOR) MISC; 1 Act by Does not apply route daily. -     Basic metabolic panel; Future -     Basic metabolic panel -     Continuous Blood Gluc Sensor (FREESTYLE LIBRE 2 SENSOR) MISC; 1 Act by Does not apply route daily. -     Continuous Blood Gluc Receiver (FREESTYLE LIBRE 2 READER) DEVI; 1 Act by Does not apply route daily. -     metFORMIN (GLUCOPHAGE) 1000 MG tablet; One po BID -     pioglitazone (ACTOS) 15 MG tablet; Take 1 tablet (15 mg total) by mouth daily.  Hyperlipidemia with target LDL less than 100- LDL goal achieved. Doing well on the statin  -     Lipid panel; Future -     TSH; Future -     TSH -     Lipid panel  Vitamin B 12 deficiency -     Folate; Future -     Cancel: Vitamin B12; Future -     cyanocobalamin ((VITAMIN B-12)) injection 1,000 mcg -     Folate  Urine frequency- She has sterile pyuria.  Antibiotics are not indicated. -     Urinalysis, Routine w reflex microscopic; Future -     CULTURE, URINE COMPREHENSIVE; Future -     CULTURE, URINE COMPREHENSIVE -     Urinalysis, Routine w reflex  microscopic  Need for prophylactic vaccination with combined diphtheria-tetanus-pertussis (DTP) vaccine -     Tdap (BOOSTRIX) 5-2.5-18.5 LF-MCG/0.5 injection; Inject 0.5 mLs into the muscle once for 1 dose.  Type 2 diabetes mellitus with complication, without long-term current use of insulin ([facility]) -     metFORMIN (GLUCOPHAGE) 1000 MG tablet; One po BID  Depression with anxiety -     PARoxetine (PAXIL-CR) 25 MG 24 hr tablet; TAKE 1 TABLET(25 MG) BY MOUTH DAILY  I have discontinued Kirsten [name]'s FreeStyle Libre 14 Day Reader, [employer] 14 Day Sensor, levothyroxine, [employer] 14 Day Reader, and [employer] 14 Day Sensor. I am also having her start on Boostrix, FreeStyle [employer], [employer] 2 Reader, levothyroxine, and pioglitazone. Additionally, I am having her maintain her albuterol, metoprolol succinate, zolpidem, ascorbic acid, amiodarone, atorvastatin, Eliquis, hydrochlorothiazide, metFORMIN, and PARoxetine. We administered cyanocobalamin.  Meds ordered this encounter  Medications   DISCONTD: Continuous Blood Gluc Receiver (FREESTYLE LIBRE 14 DAY READER) DEVI    Sig: 1 Act by Does not apply route daily.    Dispense:  2 each    Refill:  5   DISCONTD: Continuous Blood Gluc Sensor (FREESTYLE LIBRE 14 DAY SENSOR) MISC    Sig: 1 Act by Does not apply route daily.    Dispense:  2 each    Refill:  5   cyanocobalamin ((VITAMIN B-12)) injection 1,000 mcg   Tdap (BOOSTRIX) 5-2.5-18.5 LF-MCG/0.5 injection    Sig: Inject 0.5 mLs into the muscle once for 1 dose.    Dispense:  0.5 mL  Refill:  0   Continuous Blood Gluc Sensor (FREESTYLE LIBRE 2 SENSOR) MISC    Sig: 1 Act by Does not apply route daily.    Dispense:  2 each    Refill:  5   Continuous Blood Gluc Receiver (FREESTYLE LIBRE 2 READER) DEVI    Sig: 1 Act by Does not apply route daily.    Dispense:  2 each    Refill:  5   metFORMIN (GLUCOPHAGE) 1000 MG tablet    Sig: One po BID    Dispense:  180  tablet    Refill:  1   levothyroxine (SYNTHROID) 125 MCG tablet    Sig: Take 1 tablet (125 mcg total) by mouth daily.    Dispense:  90 tablet    Refill:  0   PARoxetine (PAXIL-CR) 25 MG 24 hr tablet    Sig: TAKE 1 TABLET(25 MG) BY MOUTH DAILY    Dispense:  90 tablet    Refill:  1   pioglitazone (ACTOS) 15 MG tablet    Sig: Take 1 tablet (15 mg total) by mouth daily.    Dispense:  90 tablet    Refill:  1     Follow-up: Return in about 3 months (around 10/19/2020).  [name], MD

## 2020-07-19 NOTE — Telephone Encounter (Signed)
[facility] called re: freestyle libre script sent over:  Continuous Blood Gluc Receiver (FREESTYLE LIBRE 14 DAY READER) DEVI  Continuous Blood Gluc Sensor (FREESTYLE LIBRE 14 DAY SENSOR) MISC  These items are being phased out, can script be changed to freestyle libre 2?  Please either update the script or call the pharmacy  [facility] [place], [address] [facility] AT [address] Phone:  [phone]  Fax:  [phone]

## 2020-07-19 NOTE — Patient Instructions (Signed)
Type 2 Diabetes Mellitus, Diagnosis, Adult Type 2 diabetes (type 2 diabetes mellitus) is a long-term, or chronic, disease. In type 2 diabetes, one or both of these problems may be present:  The pancreas does not make enough of a hormone called insulin.  Cells in the body do not respond properly to insulin that the body makes (insulin resistance). Normally, insulin allows blood sugar (glucose) to enter cells in the body. The cells use glucose for energy. Insulin resistance or lack of insulin causes excess glucose to build up in the blood instead of going into cells. This causes high blood glucose (hyperglycemia).  What are the causes? The exact cause of type 2 diabetes is not known. What increases the risk? The following factors may make you more likely to develop this condition:  Having a family member with type 2 diabetes.  Being overweight or obese.  Being inactive (sedentary).  Having been diagnosed with insulin resistance.  Having a history of prediabetes, diabetes when you were pregnant (gestational diabetes), or polycystic ovary syndrome (PCOS). What are the signs or symptoms? In the early stage of this condition, you may not have symptoms. Symptoms develop slowly and may include:  Increased thirst or hunger.  Increased urination.  Unexplained weight loss.  Tiredness (fatigue) or weakness.  Vision changes, such as blurry vision.  Dark patches on the skin. How is this diagnosed? This condition is diagnosed based on your symptoms, your medical history, a physical exam, and your blood glucose level. Your blood glucose may be checked with one or more of the following blood tests:  A fasting blood glucose (FBG) test. You will not be allowed to eat (you will fast) for 8 hours or longer before a blood sample is taken.  A random blood glucose test. This test checks blood glucose at any time of day regardless of when you ate.  An A1C (hemoglobin A1C) blood test. This test  provides information about blood glucose levels over the previous 2-3 months.  An oral glucose tolerance test (OGTT). This test measures your blood glucose at two times: ? After fasting. This is your baseline blood glucose level. ? Two hours after drinking a beverage that contains glucose. You may be diagnosed with type 2 diabetes if:  Your fasting blood glucose level is 126 mg/dL (7.0 mmol/L) or higher.  Your random blood glucose level is 200 mg/dL (11.1 mmol/L) or higher.  Your A1C level is 6.5% or higher.  Your oral glucose tolerance test result is higher than 200 mg/dL (11.1 mmol/L). These blood tests may be repeated to confirm your diagnosis.   How is this treated? Your treatment may be managed by a specialist called an endocrinologist. Type 2 diabetes may be treated by following instructions from your health care provider about:  Making dietary and lifestyle changes. These may include: ? Following a personalized nutrition plan that is developed by a registered dietitian. ? Exercising regularly. ? Finding ways to manage stress.  Checking your blood glucose level as often as told.  Taking diabetes medicines or insulin daily. This helps to keep your blood glucose levels in the healthy range.  Taking medicines to help prevent complications from diabetes. Medicines may include: ? Aspirin. ? Medicine to lower cholesterol. ? Medicine to control blood pressure. Your health care provider will set treatment goals for you. Your goals will be based on your age, other medical conditions you have, and how you respond to diabetes treatment. Generally, the goal of treatment is to maintain the   following blood glucose levels:  Before meals: 80-130 mg/dL (4.4-7.2 mmol/L).  After meals: below 180 mg/dL (10 mmol/L).  A1C level: less than 7%. Follow these instructions at home: Questions to ask your health care provider Consider asking the following questions:  Should I meet with a certified  diabetes care and education specialist?  What diabetes medicines do I need, and when should I take them?  What equipment will I need to manage my diabetes at home?  How often do I need to check my blood glucose?  Where can I find a support group for people with diabetes?  What number can I call if I have questions?  When is my next appointment? General instructions  Take over-the-counter and prescription medicines only as told by your health care provider.  Keep all follow-up visits as told by your health care provider. This is important. Where to find more information  American Diabetes Association (ADA): www.diabetes.org  American Association of Diabetes Care and Education Specialists (ADCES): www.diabeteseducator.org  International Diabetes Federation (IDF): www.idf.org Contact a health care provider if:  Your blood glucose is at or above 240 mg/dL (13.3 mmol/L) for 2 days in a row.  You have been sick or have had a fever for 2 days or longer, and you are not getting better.  You have any of the following problems for more than 6 hours: ? You cannot eat or drink. ? You have nausea and vomiting. ? You have diarrhea. Get help right away if:  You have severe hypoglycemia. This means your blood glucose is lower than 54 mg/dL (3.0 mmol/L).  You become confused or you have trouble thinking clearly.  You have difficulty breathing.  You have moderate or large ketone levels in your urine. These symptoms may represent a serious problem that is an emergency. Do not wait to see if the symptoms will go away. Get medical help right away. Call your local emergency services (911 in the [place] Do not drive yourself to the hospital. Summary  Type 2 diabetes (type 2 diabetes mellitus) is a long-term, or chronic, disease. In type 2 diabetes, the pancreas does not make enough of a hormone called insulin, or cells in the body do not respond properly to insulin that the body makes (insulin  resistance).  This condition is treated by making dietary and lifestyle changes and taking diabetes medicines or insulin.  Your health care provider will set treatment goals for you. Your goals will be based on your age, other medical conditions you have, and how you respond to diabetes treatment.  Keep all follow-up visits as told by your health care provider. This is important. This information is not intended to replace advice given to you by your health care provider. Make sure you discuss any questions you have with your health care provider. Document Revised: 08/26/2019 Document Reviewed: 08/26/2019 Elsevier Patient Education  2021 Elsevier Inc.  

## 2020-07-20 LAB — URINALYSIS, ROUTINE W REFLEX MICROSCOPIC
Bilirubin Urine: NEGATIVE
Hgb urine dipstick: NEGATIVE
Nitrite: POSITIVE — AB
Specific Gravity, Urine: 1.02 (ref 1.000–1.030)
Total Protein, Urine: NEGATIVE
Urine Glucose: NEGATIVE
Urobilinogen, UA: 0.2 (ref 0.0–1.0)
pH: 6 (ref 5.0–8.0)

## 2020-07-20 LAB — HEPATIC FUNCTION PANEL
ALT: 54 U/L — ABNORMAL HIGH (ref 0–35)
AST: 70 U/L — ABNORMAL HIGH (ref 0–37)
Albumin: 4.2 g/dL (ref 3.5–5.2)
Alkaline Phosphatase: 74 U/L (ref 39–117)
Bilirubin, Direct: 0.1 mg/dL (ref 0.0–0.3)
Total Bilirubin: 0.5 mg/dL (ref 0.2–1.2)
Total Protein: 7 g/dL (ref 6.0–8.3)

## 2020-07-20 LAB — LIPID PANEL
Cholesterol: 120 mg/dL (ref 0–200)
HDL: 45 mg/dL (ref >39.00)
LDL Cholesterol: 48 mg/dL (ref 0–99)
NonHDL: 74.63
Total CHOL/HDL Ratio: 3
Triglycerides: 131 mg/dL (ref 0.0–149.0)
VLDL: 26.2 mg/dL (ref 0.0–40.0)

## 2020-07-20 LAB — BASIC METABOLIC PANEL
BUN: 17 mg/dL (ref 6–23)
CO2: 27 mEq/L (ref 19–32)
Calcium: 10.5 mg/dL (ref 8.4–10.5)
Chloride: 100 mEq/L (ref 96–112)
Creatinine, Ser: 0.81 mg/dL (ref 0.40–1.20)
GFR: 77.5 mL/min (ref >60.00)
Glucose, Bld: 95 mg/dL (ref 70–99)
Potassium: 4.1 mEq/L (ref 3.5–5.1)
Sodium: 140 mEq/L (ref 135–145)

## 2020-07-20 LAB — HM DIABETES EYE EXAM

## 2020-07-20 MED ORDER — METFORMIN HCL 1000 MG PO TABS: ORAL_TABLET | ORAL | 1 refills | Status: DC

## 2020-07-20 MED ORDER — PAROXETINE HCL ER 25 MG PO TB24: ORAL_TABLET | ORAL | 1 refills | Status: DC

## 2020-07-20 MED ORDER — LEVOTHYROXINE SODIUM 125 MCG PO TABS: 125.0000 ug | ORAL_TABLET | Freq: Every day | ORAL | 0 refills | Status: DC

## 2020-07-20 MED ORDER — PIOGLITAZONE HCL 15 MG PO TABS: 15.0000 mg | ORAL_TABLET | Freq: Every day | ORAL | 1 refills | Status: DC

## 2020-07-21 LAB — CULTURE, URINE COMPREHENSIVE: RESULT:: NO GROWTH

## 2020-07-29 DIAGNOSIS — G4733 Obstructive sleep apnea (adult) (pediatric): Secondary | ICD-10-CM | POA: Diagnosis not present

## 2020-08-02 ENCOUNTER — Encounter: Payer: Self-pay | Admitting: Internal Medicine

## 2020-08-02 ENCOUNTER — Other Ambulatory Visit: Payer: Self-pay | Admitting: Internal Medicine

## 2020-08-02 DIAGNOSIS — N3001 Acute cystitis with hematuria: Secondary | ICD-10-CM | POA: Insufficient documentation

## 2020-08-02 DIAGNOSIS — N390 Urinary tract infection, site not specified: Secondary | ICD-10-CM | POA: Insufficient documentation

## 2020-08-02 MED ORDER — METHENAMINE HIPPURATE 1 G PO TABS
1.0000 g | ORAL_TABLET | Freq: Two times a day (BID) | ORAL | 1 refills | Status: DC
Start: 2020-08-02 — End: 2020-10-18

## 2020-08-02 MED ORDER — NITROFURANTOIN MONOHYD MACRO 100 MG PO CAPS: 100.0000 mg | ORAL_CAPSULE | Freq: Two times a day (BID) | ORAL | 0 refills | Status: AC

## 2020-08-04 DIAGNOSIS — E119 Type 2 diabetes mellitus without complications: Secondary | ICD-10-CM | POA: Diagnosis not present

## 2020-08-17 DIAGNOSIS — M461 Sacroiliitis, not elsewhere classified: Secondary | ICD-10-CM | POA: Diagnosis not present

## 2020-09-04 ENCOUNTER — Other Ambulatory Visit: Payer: Self-pay | Admitting: Internal Medicine

## 2020-09-04 DIAGNOSIS — F409 Phobic anxiety disorder, unspecified: Secondary | ICD-10-CM

## 2020-09-04 DIAGNOSIS — G4700 Insomnia, unspecified: Secondary | ICD-10-CM

## 2020-09-06 DIAGNOSIS — M461 Sacroiliitis, not elsewhere classified: Secondary | ICD-10-CM | POA: Diagnosis not present

## 2020-09-06 DIAGNOSIS — G4733 Obstructive sleep apnea (adult) (pediatric): Secondary | ICD-10-CM | POA: Diagnosis not present

## 2020-09-10 ENCOUNTER — Other Ambulatory Visit: Payer: Self-pay | Admitting: Internal Medicine

## 2020-09-10 DIAGNOSIS — G4700 Insomnia, unspecified: Secondary | ICD-10-CM

## 2020-09-10 DIAGNOSIS — F5105 Insomnia due to other mental disorder: Secondary | ICD-10-CM

## 2020-09-10 DIAGNOSIS — G4730 Sleep apnea, unspecified: Secondary | ICD-10-CM

## 2020-09-10 DIAGNOSIS — F409 Phobic anxiety disorder, unspecified: Secondary | ICD-10-CM

## 2020-09-16 ENCOUNTER — Telehealth: Payer: Self-pay | Admitting: Emergency Medicine

## 2020-09-16 ENCOUNTER — Telehealth: Payer: Self-pay | Admitting: Internal Medicine

## 2020-09-16 DIAGNOSIS — Z03818 Encounter for observation for suspected exposure to other biological agents ruled out: Secondary | ICD-10-CM | POA: Diagnosis not present

## 2020-09-16 DIAGNOSIS — J209 Acute bronchitis, unspecified: Secondary | ICD-10-CM | POA: Diagnosis not present

## 2020-09-16 NOTE — Telephone Encounter (Signed)
Patient experiencing cough, shortness of breath, & fever  Offered VV... patient declined  Transferred to [facility]

## 2020-09-16 NOTE — Telephone Encounter (Signed)
Pt called Access Nurse 09/16/2020 8:20 am. CAller states prod cough, using inhaler, chronic bronchitis. Temp 101. No Current dyspneic, wheeze, negative covid last night, home test. Symptoms started yesterday. Recommended to be seen within 4 hrs but no appts available. Pt stated she will go to [facility].

## 2020-09-16 NOTE — Telephone Encounter (Signed)
Noted  

## 2020-09-17 ENCOUNTER — Emergency Department
Admission: EM | Admit: 2020-09-17 | Discharge: 2020-09-18 | Disposition: A | Discharge status: Home / Self Care | Payer: PPO | Attending: Emergency Medicine | Admitting: Emergency Medicine

## 2020-09-17 ENCOUNTER — Encounter: Payer: Self-pay | Admitting: Emergency Medicine

## 2020-09-17 ENCOUNTER — Emergency Department: Payer: PPO

## 2020-09-17 ENCOUNTER — Other Ambulatory Visit: Payer: Self-pay

## 2020-09-17 DIAGNOSIS — E119 Type 2 diabetes mellitus without complications: Secondary | ICD-10-CM | POA: Diagnosis not present

## 2020-09-17 DIAGNOSIS — Z7984 Long term (current) use of oral hypoglycemic drugs: Secondary | ICD-10-CM | POA: Diagnosis not present

## 2020-09-17 DIAGNOSIS — Z79899 Other long term (current) drug therapy: Secondary | ICD-10-CM | POA: Insufficient documentation

## 2020-09-17 DIAGNOSIS — I10 Essential (primary) hypertension: Secondary | ICD-10-CM | POA: Insufficient documentation

## 2020-09-17 DIAGNOSIS — E039 Hypothyroidism, unspecified: Secondary | ICD-10-CM | POA: Insufficient documentation

## 2020-09-17 DIAGNOSIS — J181 Lobar pneumonia, unspecified organism: Secondary | ICD-10-CM | POA: Diagnosis not present

## 2020-09-17 DIAGNOSIS — R109 Unspecified abdominal pain: Secondary | ICD-10-CM | POA: Diagnosis not present

## 2020-09-17 DIAGNOSIS — I4891 Unspecified atrial fibrillation: Secondary | ICD-10-CM | POA: Diagnosis not present

## 2020-09-17 DIAGNOSIS — D72829 Elevated white blood cell count, unspecified: Secondary | ICD-10-CM | POA: Diagnosis not present

## 2020-09-17 DIAGNOSIS — K219 Gastro-esophageal reflux disease without esophagitis: Secondary | ICD-10-CM | POA: Diagnosis not present

## 2020-09-17 DIAGNOSIS — Z8616 Personal history of COVID-19: Secondary | ICD-10-CM | POA: Insufficient documentation

## 2020-09-17 DIAGNOSIS — R0602 Shortness of breath: Secondary | ICD-10-CM | POA: Diagnosis not present

## 2020-09-17 DIAGNOSIS — Z7901 Long term (current) use of anticoagulants: Secondary | ICD-10-CM | POA: Diagnosis not present

## 2020-09-17 DIAGNOSIS — J4530 Mild persistent asthma, uncomplicated: Secondary | ICD-10-CM | POA: Insufficient documentation

## 2020-09-17 DIAGNOSIS — J189 Pneumonia, unspecified organism: Secondary | ICD-10-CM

## 2020-09-17 DIAGNOSIS — R059 Cough, unspecified: Secondary | ICD-10-CM | POA: Diagnosis not present

## 2020-09-17 LAB — CBC WITH DIFFERENTIAL/PLATELET
Abs Immature Granulocytes: 0.25 10*3/uL — ABNORMAL HIGH (ref 0.00–0.07)
Basophils Absolute: 0.1 10*3/uL (ref 0.0–0.1)
Basophils Relative: 0 %
Eosinophils Absolute: 0 10*3/uL (ref 0.0–0.5)
Eosinophils Relative: 0 %
HCT: 41.5 % (ref 36.0–46.0)
Hemoglobin: 13.8 g/dL (ref 12.0–15.0)
Immature Granulocytes: 1 %
Lymphocytes Relative: 11 %
Lymphs Abs: 2.8 10*3/uL (ref 0.7–4.0)
MCH: 30.4 pg (ref 26.0–34.0)
MCHC: 33.3 g/dL (ref 30.0–36.0)
MCV: 91.4 fL (ref 80.0–100.0)
Monocytes Absolute: 1.4 10*3/uL — ABNORMAL HIGH (ref 0.1–1.0)
Monocytes Relative: 6 %
Neutro Abs: 20.1 10*3/uL — ABNORMAL HIGH (ref 1.7–7.7)
Neutrophils Relative %: 82 %
Platelets: 377 10*3/uL (ref 150–400)
RBC: 4.54 MIL/uL (ref 3.87–5.11)
RDW: 13.2 % (ref 11.5–15.5)
WBC: 24.6 10*3/uL — ABNORMAL HIGH (ref 4.0–10.5)
nRBC: 0 % (ref 0.0–0.2)

## 2020-09-17 LAB — BASIC METABOLIC PANEL
Anion gap: 8 (ref 5–15)
BUN: 16 mg/dL (ref 8–23)
CO2: 27 mmol/L (ref 22–32)
Calcium: 9.4 mg/dL (ref 8.9–10.3)
Chloride: 98 mmol/L (ref 98–111)
Creatinine, Ser: 0.78 mg/dL (ref 0.44–1.00)
GFR, Estimated: >60 mL/min (ref >60)
Glucose, Bld: 267 mg/dL — ABNORMAL HIGH (ref 70–99)
Potassium: 3.9 mmol/L (ref 3.5–5.1)
Sodium: 133 mmol/L — ABNORMAL LOW (ref 135–145)

## 2020-09-17 MED ORDER — ALBUTEROL SULFATE (2.5 MG/3ML) 0.083% IN NEBU: 3.0000 mL | INHALATION_SOLUTION | RESPIRATORY_TRACT | Status: DC | PRN

## 2020-09-17 MED ORDER — LEVOFLOXACIN 750 MG PO TABS
750.0000 mg | ORAL_TABLET | Freq: Once | ORAL | Status: AC
  Administered 2020-09-18: 750 mg via ORAL
  Filled 2020-09-17: qty 1

## 2020-09-17 NOTE — ED Triage Notes (Signed)
Pt c/o cough and increasing SOB x several days, pt states hx of chronic bronchitis and asthma, states was seen at [facility] yesterday and given cough syrup and medrol pack. Pt states coughing up thick green sputum at this time. Pt with noted wheezing in triage and frequent productive cough.

## 2020-09-18 MED ORDER — LEVOFLOXACIN 750 MG PO TABS: 750.0000 mg | ORAL_TABLET | Freq: Every day | ORAL | 0 refills | Status: AC

## 2020-09-18 NOTE — Discharge Instructions (Addendum)
We believe that your symptoms are caused today by pneumonia, an infection in your lung(s).  Fortunately you should start to improve quickly after taking your antibiotics.  Please take the full course of antibiotics as prescribed and drink plenty of fluids.    Follow up with your doctor within 1-2 days.  If you develop any new or worsening symptoms, including but not limited to fever in spite of taking over-the-counter ibuprofen and/or Tylenol, persistent vomiting, worsening shortness of breath, or other symptoms that concern you, please return to the Emergency Department immediately.   Please also continue to take all of your regular medications and the ones recently prescribed at the urgent care.  Remember that you can use your albuterol inhaler more often than on the label; you may want to use it is much as 4 puffs every 2 hours if it helps you breathe.

## 2020-09-18 NOTE — ED Provider Notes (Signed)
[facility] Emergency Department Provider Note  ____________________________________________   Event Date/Time   First MD Initiated Contact with Patient 09/17/20 2335     (approximate)  I have reviewed the triage vital signs and the nursing notes.   HISTORY  Chief Complaint Cough and Shortness of Breath    HPI Kirsten [name] is a 65 y.o. female with medical history as listed below who presents for evaluation of general malaise, productive cough, shortness of breath, and some wheezing.  She has had some subjective fevers as well.  The symptoms been gradually worsening over the last several days.  She went to an urgent care yesterday and they prescribed her some steroids and an albuterol inhaler but she said that it is not helping.  Nothing in particular makes his symptoms better and exertion makes it worse.  She describes the symptoms as severe by tonight.  She has been told in the past that she has chronic bronchitis but has never been told that she has a diagnosis of COPD nor asthma.  She has never used tobacco.  She was diagnosed with COVID about 7 to 8 months ago but is fully vaccinated and boosted and she says that she recovered back to her baseline.  She currently denies sore throat, nausea, vomiting, chest pain except for when she coughs, and abdominal pain.     Past Medical History:  Diagnosis Date   A-fib ([place])    Anxiety disorder    Asthma    Dyspnea    Gastroschisis    umphalocele, rotated organs abd repair until age 65   GERD (gastroesophageal reflux disease)    HTN (hypertension)    Hyperlipidemia    IBS (irritable bowel syndrome)    Morbid obesity (HCC)    Target wt - 185  for BMI < 30   Obesity    OSA on CPAP    Pulmonary fibrosis (HCC)    SBO (small bowel obstruction) ([place])    Resolved with NG/Bowel rest around 2009   Sleep apnea    Type II or unspecified type diabetes mellitus without mention of complication, not stated as  uncontrolled     Patient Active Problem List   Diagnosis Date Noted   Acute cystitis with hematuria 08/02/2020   Frequent UTI 08/02/2020   Spinal stenosis, lumbar region with neurogenic claudication 12/31/2019   DDD (degenerative disc disease), lumbar 08/11/2019   Primary osteoarthritis of right hip 08/11/2019   Urinary urgency 08/10/2019   NASH (nonalcoholic steatohepatitis) 06/02/2019   Diabetes mellitus without complication ([place]) 62/83/6629   Colon cancer screening 12/25/2017   Estrogen deficiency 12/25/2017   Pulmonary fibrosis ([place])    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 03/21/2017   Insomnia w/ sleep apnea 03/21/2017   Class 1 obesity with serious comorbidity and body mass index (BMI) of 34.0 to 34.9 in adult 01/31/2016   Vaginal atrophy 01/13/2015   Insomnia due to anxiety and fear 10/11/2014   GAD (generalized anxiety disorder) 10/11/2014   Paroxysmal atrial fibrillation ([place]) 04/08/2014   Hyperlipidemia with target LDL less than 100 10/22/2012   Asthma, mild persistent 04/06/2011   Hypertriglyceridemia 01/29/2011   Vitamin B 12 deficiency 06/28/2009   Hypothyroidism 06/20/2009   Depression with anxiety 06/20/2009   Type II diabetes mellitus with manifestations ([place]) 04/08/2009   Essential hypertension, benign 04/08/2009   OSA (obstructive sleep apnea) 04/08/2009    Past Surgical History:  Procedure Laterality Date   BREAST EXCISIONAL BIOPSY Left 03/19/2012  neg   CESAREAN SECTION     CHOLECYSTECTOMY  1992   COLONOSCOPY  2011   2011-normal   Newborn Surgery - GI - ORGANS OUTSIDE ABDOMEN     Small Bowel Repair     TUBAL LIGATION  1988    Prior to Admission medications   Medication Sig Start Date End Date Taking? Authorizing Provider  levofloxacin (LEVAQUIN) 750 MG tablet Take 1 tablet (750 mg total) by mouth daily for 5 days. 09/18/20 09/23/20 Yes Hinda Kehr, MD  albuterol (VENTOLIN HFA) 108 (90 Base) MCG/ACT inhaler Inhale 2 puffs into the lungs every 6  (six) hours as needed for wheezing or shortness of breath. 02/11/19   Biagio Borg, MD  amiodarone (PACERONE) 200 MG tablet Take 1 tablet (200 mg total) by mouth daily. 03/07/20   Max Sane, MD  apixaban (ELIQUIS) 5 MG TABS tablet Take 1 tablet (5 mg total) by mouth 2 (two) times daily. 05/23/20   [name], MD  ascorbic acid (VITAMIN C) 500 MG tablet Take 1 tablet (500 mg total) by mouth daily. 03/05/20   Max Sane, MD  atorvastatin (LIPITOR) 40 MG tablet TAKE 1 TABLET(40 MG) BY MOUTH DAILY 03/07/20   Max Sane, MD  Continuous Blood Gluc Receiver (FREESTYLE LIBRE 2 READER) DEVI 1 Act by Does not apply route daily. 07/19/20   [name], MD  Continuous Blood Gluc Sensor (FREESTYLE LIBRE 2 SENSOR) MISC 1 Act by Does not apply route daily. 07/19/20   [name], MD  hydrochlorothiazide (HYDRODIURIL) 25 MG tablet Take 1 tablet by mouth daily. 05/27/20   [provider]  levothyroxine (SYNTHROID) 125 MCG tablet Take 1 tablet (125 mcg total) by mouth daily. 07/20/20   [name], MD  metFORMIN (GLUCOPHAGE) 1000 MG tablet One po BID 07/20/20   [name], MD  methenamine (HIPREX) 1 g tablet Take 1 tablet (1 g total) by mouth 2 (two) times daily with a meal. 08/02/20   [name], MD  metoprolol succinate (TOPROL-XL) 50 MG 24 hr tablet Take 1 tablet (50 mg total) by mouth daily. 11/16/19   [name], MD  PARoxetine (PAXIL-CR) 25 MG 24 hr tablet TAKE 1 TABLET(25 MG) BY MOUTH DAILY 07/20/20   [name], MD  pioglitazone (ACTOS) 15 MG tablet Take 1 tablet (15 mg total) by mouth daily. 07/20/20   [name], MD  zolpidem (AMBIEN) 10 MG tablet TAKE ONE TABLET BY MOUTH ONE TIME DAILY 09/04/20   [name], MD    Allergies Factive [gemifloxacin mesylate], Crestor [rosuvastatin], Sulfonamide derivatives, Gemifloxacin, and Penicillins  Family History  Problem Relation Age of Onset   Lymphoma Mother    Diabetes type II Father    Colon cancer Father    Diabetes  type I Son    Diabetes Maternal Grandmother    Goiter Neg Hx    Breast cancer Neg Hx    Ovarian cancer Neg Hx     Social History Social History   Tobacco Use   Smoking status: Never   Smokeless tobacco: Never   Tobacco comments:    "tried as a teen"  Vaping Use   Vaping Use: Never used  Substance Use Topics   Alcohol use: Yes    Alcohol/week: 2.0 standard drinks    Types: 2 Glasses of wine per week    Comment: rare   Drug use: No    Review of Systems Constitutional: Subjective fever. Eyes: No visual changes.  ENT: No sore throat. Cardiovascular: Chest pain when she coughs. Respiratory: Positive for shortness of breath, wheezing, and productive cough. Gastrointestinal: No abdominal pain.  No nausea, no vomiting.  No diarrhea.  No constipation. Genitourinary: Negative for dysuria. Musculoskeletal: Negative for neck pain.  Negative for back pain. Integumentary: Negative for rash. Neurological: Negative for headaches, focal weakness or numbness.   ____________________________________________   PHYSICAL EXAM:  VITAL SIGNS: ED Triage Vitals  Enc Vitals Group     BP 09/17/20 1938 (!) 139/50     Pulse Rate 09/17/20 1938 70     Resp 09/17/20 1938 (!) 28     Temp 09/17/20 1938 98.5 F (36.9 C)     Temp Source 09/17/20 1938 Oral     SpO2 09/17/20 1938 94 %     Weight 09/17/20 1940 91.6 kg (202 lb)     Height 09/17/20 1940 1.702 m (5' 7" )     Head Circumference --      Peak Flow --      Pain Score 09/17/20 1940 6     Pain Loc --      Pain Edu? --      Excl. in [place]? --     Constitutional: Alert and oriented.  Eyes: Conjunctivae are normal.  Head: Atraumatic. Nose: No congestion/rhinnorhea. Mouth/Throat: Patient is wearing a mask. Neck: No stridor.  No meningeal signs.   Cardiovascular: Normal rate, regular rhythm. Good peripheral circulation. Respiratory: Normal respiratory effort.  No retractions.  Frequent cough.  Some coarse breath sounds most notable in the  right lower lobe.  No audible wheezing at this time. Gastrointestinal: Soft and nontender. No distention.  Musculoskeletal: No lower extremity tenderness nor edema. No gross deformities of extremities. Neurologic:  Normal speech and language. No gross focal neurologic deficits are appreciated.  Skin:  Skin is warm, dry and intact. Psychiatric: Mood and affect are normal. Speech and behavior are normal.  ____________________________________________   LABS (all labs ordered are listed, but only abnormal results are displayed)  Labs Reviewed  CBC WITH DIFFERENTIAL/PLATELET - Abnormal; Notable for the following components:      Result Value   WBC 24.6 (*)    Neutro Abs 20.1 (*)    Monocytes Absolute 1.4 (*)    Abs Immature Granulocytes 0.25 (*)    All other components within normal limits  BASIC METABOLIC PANEL - Abnormal; Notable for the following components:   Sodium 133 (*)    Glucose, Bld 267 (*)    All other components within normal limits   ____________________________________________  EKG  ED ECG REPORT I, Hinda Kehr, the attending physician, personally viewed and interpreted this ECG.  Date: 09/17/2020 EKG Time: 19: 38 Rate: 67 Rhythm: normal sinus rhythm QRS Axis: normal Intervals: normal ST/T Wave abnormalities: Non-specific ST segment / T-wave changes, but no clear evidence of acute ischemia. Narrative Interpretation: no definitive evidence of acute ischemia; does not meet STEMI criteria.  ____________________________________________  RADIOLOGY I, Hinda Kehr, personally viewed and evaluated these images (plain radiographs) as part of my medical decision making, as well as reviewing the written report by the radiologist.  ED MD interpretation: Right lower lobe infiltrate  Official radiology report(s): DG Chest 2 View  Result Date: 09/17/2020 CLINICAL DATA:  Shortness of breath and increasing cough over several days EXAM: CHEST - 2 VIEW COMPARISON:   03/16/2020 FINDINGS: Cardiac shadow is stable. Lungs are well aerated bilaterally. Persistent scarring is noted in the left lung base. Focal eventration of the right hemidiaphragm  is noted stable in appearance from the previous exam. Increasing opacity in the right lung base is noted consistent with early infiltrate. IMPRESSION: Early right basilar infiltrate. Electronically Signed   By: Inez Catalina M.D.   On: 09/17/2020 20:08    ____________________________________________   PROCEDURES   Procedure(s) performed (including Critical Care):  Procedures   ____________________________________________   INITIAL IMPRESSION / MDM / [place] / ED COURSE  As part of my medical decision making, I reviewed the following data within the [facility] notes reviewed and incorporated, Labs reviewed , EKG interpreted , Old chart reviewed, Radiograph reviewed , and Notes from prior ED visits   Differential diagnosis includes, but is not limited to, to be acquired pneumonia, COVID, COPD, bronchitis, PE.  Patient is having signs and symptoms that are most consistent with community-acquired pneumonia.  One of her primary issues is productive cough and PE would be unlikely to present this way.  Additionally, she takes Eliquis and has been compliant with her medications.  I think it is much more likely that she is suffering from a community-acquired pneumonia than a pulmonary embolism in the setting of Eliquis.  She is not tachycardic and not hypoxemic.  If she has a coughing episode she might drop down to 94% but otherwise she is staying up in the mid to upper 90s.  Her lab work is notable for a leukocytosis of 24.6 which is consistent with acute infection but she is also taking steroids which is likely elevating it as well.  Basic metabolic panel is essentially normal other than hyperglycemia and a slightly decreased sodium.  She is able to tolerate oral intake and having no  diarrhea, nausea, nor vomiting.    Her EKG is nonischemic, and I personally reviewed the patient's imaging and agree with the radiologist's interpretation that there is a right lower lobe infiltrate.  We talked about the appropriate plan and disposition and she does not want to stay in the hospital.  She also is relatively low risk for severe pneumonia and poor outcome and I do not think she would benefit from staying in the hospital based on her generally reassuring assessment.  We talked about antibiotic options and she says that she has a reaction to penicillins.  She said she has taken Levaquin before even though she reportedly has an allergy to gemifloxacin.  We talked about the risks and benefits of Levaquin in terms of good antibiotic coverage but some of the more recent blackbox warnings, concerns for tendon issues, etc.  She says she is very comfortable taking it and that it helped her in the past.  I think it is appropriate given her comorbidities that we cover broadly with Levaquin for a 5-day course.  I am also giving her a dose tonight so he can start working.  I encouraged her to continue using her albuterol inhaler as needed and to keep taking the steroids as well since she already started them.  I gave her strict return precautions and she understands and agrees with the plan.  ____________________________________________  FINAL CLINICAL IMPRESSION(S) / ED DIAGNOSES  Final diagnoses:  Community acquired pneumonia of right lower lobe of lung     MEDICATIONS GIVEN DURING THIS VISIT:  Medications  albuterol (PROVENTIL) (2.5 MG/3ML) 0.083% nebulizer solution 3 mL (has no administration in time range)  levofloxacin (LEVAQUIN) tablet 750 mg (750 mg Oral Given 09/18/20 0035)     ED Discharge Orders  Ordered    levofloxacin (LEVAQUIN) 750 MG tablet  Daily        09/18/20 0037             Note:  This document was prepared using Dragon voice recognition software and  may include unintentional dictation errors.   Hinda Kehr, MD 09/18/20 [phone]

## 2020-09-19 DIAGNOSIS — G4733 Obstructive sleep apnea (adult) (pediatric): Secondary | ICD-10-CM | POA: Diagnosis not present

## 2020-09-20 ENCOUNTER — Telehealth: Payer: Self-pay | Admitting: Internal Medicine

## 2020-09-20 NOTE — Telephone Encounter (Signed)
Call made to patient,confirmed DOB. Made aware of MD recommendations. Voiced understanding. Patient to obtained delsym to help with cough. Patient aware if symptoms get worse, to go to ED.   Nothing further needed at this time.

## 2020-09-20 NOTE — Telephone Encounter (Signed)
Call returned to patient, confirmed DOB. Patient states she went to ED this weekend. She was dx with right pulmonary effusion. She was given Levaquin and albuterol inhaler. Denies fevers. She is having chills and sweats, malaise, and increased wheezing. She reports she went to urgent care and her covid test was negative. She was told she had bronchitis and was given medrol pack and a cough syrup. She did not get any better so she went to ED. She does have a cpap but she has not been able to use due to coughing and wheezing. Reports productive cough with green and brown mucous. She has not tried anything OTC to help with cough or mucous.   MH please advise. No appt for next two weeks.

## 2020-09-20 NOTE — Telephone Encounter (Signed)
Reviewed chart. Seen 8/7 in ED and diagnosed with pneumonia started on levaquin. Recommend delsym BID for cough. If she feels worse with recent diagnosis of pneumonia despite Levaquin and recent steroid course she should re-present to the ED for further evaluation.

## 2020-09-26 ENCOUNTER — Telehealth: Payer: Self-pay

## 2020-09-26 NOTE — Telephone Encounter (Signed)
Left voicemail notifying the pt that there was an open slot for tomorrow however there isn't a guarantee that the slot will still be seen.

## 2020-09-27 ENCOUNTER — Encounter: Payer: Self-pay | Admitting: Internal Medicine

## 2020-09-27 ENCOUNTER — Ambulatory Visit (INDEPENDENT_AMBULATORY_CARE_PROVIDER_SITE_OTHER): Payer: PPO

## 2020-09-27 ENCOUNTER — Ambulatory Visit (INDEPENDENT_AMBULATORY_CARE_PROVIDER_SITE_OTHER): Payer: PPO | Admitting: Internal Medicine

## 2020-09-27 ENCOUNTER — Other Ambulatory Visit: Payer: Self-pay

## 2020-09-27 VITALS — BP 128/84 | HR 71 | Temp 98.3°F | Resp 16 | Ht 67.0 in | Wt 201.0 lb

## 2020-09-27 DIAGNOSIS — J189 Pneumonia, unspecified organism: Secondary | ICD-10-CM | POA: Diagnosis not present

## 2020-09-27 DIAGNOSIS — J4531 Mild persistent asthma with (acute) exacerbation: Secondary | ICD-10-CM

## 2020-09-27 DIAGNOSIS — E785 Hyperlipidemia, unspecified: Secondary | ICD-10-CM | POA: Diagnosis not present

## 2020-09-27 DIAGNOSIS — J690 Pneumonitis due to inhalation of food and vomit: Secondary | ICD-10-CM | POA: Insufficient documentation

## 2020-09-27 MED ORDER — TRELEGY ELLIPTA 100-62.5-25 MCG/INH IN AEPB: 1.0000 | INHALATION_SPRAY | Freq: Every day | RESPIRATORY_TRACT | 1 refills | Status: DC

## 2020-09-27 MED ORDER — HYDROCODONE BIT-HOMATROP MBR 5-1.5 MG/5ML PO SOLN: 5.0000 mL | Freq: Three times a day (TID) | ORAL | 0 refills | Status: DC | PRN

## 2020-09-27 MED ORDER — ATORVASTATIN CALCIUM 40 MG PO TABS: ORAL_TABLET | ORAL | 1 refills | Status: DC

## 2020-09-27 NOTE — Patient Instructions (Signed)

## 2020-09-27 NOTE — Progress Notes (Signed)
Subjective:  Patient ID: Kirsten [name], female    DOB: [birth year]  Age: 65 y.o. MRN: [id number]  CC: Cough  This visit occurred during the SARS-CoV-2 public health emergency.  Safety protocols were in place, including screening questions prior to the visit, additional usage of staff PPE, and extensive cleaning of exam room while observing appropriate contact time as indicated for disinfecting solutions.    HPI NEVENA [name] presents for f/up -  She was recently diagnosed with right lower lobe pneumonia.  She has completed her antibiotics.  She is improving in many respects but she continues to complain of a cough that is productive of white foamy phlegm with shortness of breath and wheezing.  She tells me she is using her albuterol inhaler too frequently.  She has had some fatigue but denies night sweats, fever, chills, chest pain, or hemoptysis.  Outpatient Medications Prior to Visit  Medication Sig Dispense Refill   albuterol (VENTOLIN HFA) 108 (90 Base) MCG/ACT inhaler Inhale 2 puffs into the lungs every 6 (six) hours as needed for wheezing or shortness of breath. 18 g 2   amiodarone (PACERONE) 200 MG tablet Take 1 tablet (200 mg total) by mouth daily. 90 tablet 1   apixaban (ELIQUIS) 5 MG TABS tablet Take 1 tablet (5 mg total) by mouth 2 (two) times daily. 180 tablet 1   ascorbic acid (VITAMIN C) 500 MG tablet Take 1 tablet (500 mg total) by mouth daily. 30 tablet 0   Continuous Blood Gluc Receiver (FREESTYLE LIBRE 2 READER) DEVI 1 Act by Does not apply route daily. 2 each 5   Continuous Blood Gluc Sensor (FREESTYLE LIBRE 2 SENSOR) MISC 1 Act by Does not apply route daily. 2 each 5   hydrochlorothiazide (HYDRODIURIL) 25 MG tablet Take 1 tablet by mouth daily.     levothyroxine (SYNTHROID) 125 MCG tablet Take 1 tablet (125 mcg total) by mouth daily. 90 tablet 0   metFORMIN (GLUCOPHAGE) 1000 MG tablet One po BID 180 tablet 1   methenamine (HIPREX) 1 g tablet Take 1 tablet (1 g total)  by mouth 2 (two) times daily with a meal. 180 tablet 1   metoprolol succinate (TOPROL-XL) 50 MG 24 hr tablet Take 1 tablet (50 mg total) by mouth daily. 90 tablet 1   PARoxetine (PAXIL-CR) 25 MG 24 hr tablet TAKE 1 TABLET(25 MG) BY MOUTH DAILY 90 tablet 1   pioglitazone (ACTOS) 15 MG tablet Take 1 tablet (15 mg total) by mouth daily. 90 tablet 1   zolpidem (AMBIEN) 10 MG tablet TAKE ONE TABLET BY MOUTH ONE TIME DAILY 90 tablet 0   atorvastatin (LIPITOR) 40 MG tablet TAKE 1 TABLET(40 MG) BY MOUTH DAILY 90 tablet 1   No facility-administered medications prior to visit.    ROS Review of Systems  Constitutional:  Positive for fatigue. Negative for chills, diaphoresis, fever and unexpected weight change.  HENT: Negative.    Eyes: Negative.   Respiratory:  Positive for cough, shortness of breath and wheezing. Negative for chest tightness.   Cardiovascular:  Negative for chest pain, palpitations and leg swelling.  Gastrointestinal:  Negative for abdominal pain, constipation, diarrhea, nausea and vomiting.  Endocrine: Negative.   Genitourinary: Negative.  Negative for difficulty urinating.  Musculoskeletal:  Negative for arthralgias and myalgias.  Skin: Negative.  Negative for color change and pallor.  Neurological:  Negative for dizziness, weakness and headaches.  Hematological:  Negative for adenopathy. Does not bruise/bleed easily.  Psychiatric/Behavioral: Negative.  Objective:  BP 128/84 (BP Location: Right Arm, Patient Position: Sitting, Cuff Size: Large)   Pulse 71   Temp 98.3 F (36.8 C) (Oral)   Resp 16   Ht 5' 7"  (1.702 m)   Wt 201 lb (91.2 kg)   SpO2 94%   BMI 31.48 kg/m   BP Readings from Last 3 Encounters:  09/27/20 128/84  09/18/20 119/74  07/19/20 134/82    Wt Readings from Last 3 Encounters:  09/27/20 201 lb (91.2 kg)  09/17/20 202 lb (91.6 kg)  07/19/20 202 lb 9.6 oz (91.9 kg)    Physical Exam Vitals reviewed.  Constitutional:      Appearance: She is  not ill-appearing.  HENT:     Nose: Nose normal.     Mouth/Throat:     Mouth: Mucous membranes are moist.  Eyes:     Conjunctiva/sclera: Conjunctivae normal.  Cardiovascular:     Rate and Rhythm: Normal rate and regular rhythm.     Heart sounds: No murmur heard. Pulmonary:     Effort: Pulmonary effort is normal. No accessory muscle usage or respiratory distress.     Breath sounds: No stridor. Examination of the right-lower field reveals rhonchi. Rhonchi present. No wheezing or rales.  Abdominal:     General: Bowel sounds are normal.     Palpations: Abdomen is soft. There is no mass.     Tenderness: There is no abdominal tenderness.  Musculoskeletal:        General: Normal range of motion.     Cervical back: Neck supple.     Right lower leg: No edema.     Left lower leg: No edema.  Lymphadenopathy:     Cervical: No cervical adenopathy.  Skin:    General: Skin is warm.  Neurological:     Mental Status: She is alert. Mental status is at baseline.    Lab Results  Component Value Date   WBC 24.6 (H) 09/17/2020   HGB 13.8 09/17/2020   HCT 41.5 09/17/2020   PLT 377 09/17/2020   GLUCOSE 267 (H) 09/17/2020   CHOL 120 07/19/2020   TRIG 131.0 07/19/2020   HDL 45.00 07/19/2020   LDLDIRECT 63.0 01/15/2019   LDLCALC 48 07/19/2020   ALT 54 (H) 07/19/2020   AST 70 (H) 07/19/2020   NA 133 (L) 09/17/2020   K 3.9 09/17/2020   CL 98 09/17/2020   CREATININE 0.78 09/17/2020   BUN 16 09/17/2020   CO2 27 09/17/2020   TSH 5.06 (H) 07/19/2020   INR 1.4 (H) 06/02/2019   HGBA1C 6.7 (H) 07/19/2020   MICROALBUR 2.0 (H) 11/16/2019    DG Chest 2 View  Result Date: 09/17/2020 CLINICAL DATA:  Shortness of breath and increasing cough over several days EXAM: CHEST - 2 VIEW COMPARISON:  03/16/2020 FINDINGS: Cardiac shadow is stable. Lungs are well aerated bilaterally. Persistent scarring is noted in the left lung base. Focal eventration of the right hemidiaphragm is noted stable in appearance  from the previous exam. Increasing opacity in the right lung base is noted consistent with early infiltrate. IMPRESSION: Early right basilar infiltrate. Electronically Signed   By: Inez Catalina M.D.   On: 09/17/2020 20:08   DG Chest 2 View  Result Date: 09/27/2020 CLINICAL DATA:  Follow-up right lower lobe pneumonia. EXAM: CHEST - 2 VIEW COMPARISON:  Radiograph 09/17/2020, CT 02/12/2020 FINDINGS: There is chronic volume loss at the right lung base with eventration of the right hemidiaphragm. Improved right mild residual opacity persists. Bandlike scarring  at the left lung base is unchanged. Stable heart size and mediastinal contours. No pulmonary edema or pneumothorax. No significant pleural effusion. No acute osseous abnormalities are seen. Basilar aeration from prior exam. IMPRESSION: 1. Improved right lung base opacity from prior exam with mild residual. Recommend continued radiographic follow-up. There is background chronic volume loss at the right lung base with eventration of the right hemidiaphragm. 2. Unchanged left basilar scarring. Electronically Signed   By: Keith Rake M.D.   On: 09/27/2020 16:13     Assessment & Plan:   Ayen was seen today for cough.  Diagnoses and all orders for this visit:  Hyperlipidemia with target LDL less than 100 -     atorvastatin (LIPITOR) 40 MG tablet; TAKE 1 TABLET(40 MG) BY MOUTH DAILY  Pneumonia of right lower lobe due to infectious organism- Based on her symptoms, exam, and chest x-ray this is resolving.  I do not think another course of antibiotics is indicated. -     DG Chest 2 View; Future -     HYDROcodone bit-homatropine (HYCODAN) 5-1.5 MG/5ML syrup; Take 5 mLs by mouth every 8 (eight) hours as needed for cough.  Mild persistent asthma with acute exacerbation- I have asked her to start using an ICS/LAMA/LABA inhaler. -     Fluticasone-Umeclidin-Vilant (TRELEGY ELLIPTA) 100-62.5-25 MCG/INH AEPB; Inhale 1 puff into the lungs daily.  I am  having Aleida A. Groft start on Trelegy Ellipta and HYDROcodone bit-homatropine. I am also having her maintain her albuterol, metoprolol succinate, ascorbic acid, amiodarone, Eliquis, hydrochlorothiazide, FreeStyle Libre 2 Sensor, [employer] 2 Reader, metFORMIN, levothyroxine, PARoxetine, pioglitazone, methenamine, zolpidem, and atorvastatin.  Meds ordered this encounter  Medications   atorvastatin (LIPITOR) 40 MG tablet    Sig: TAKE 1 TABLET(40 MG) BY MOUTH DAILY    Dispense:  90 tablet    Refill:  1   Fluticasone-Umeclidin-Vilant (TRELEGY ELLIPTA) 100-62.5-25 MCG/INH AEPB    Sig: Inhale 1 puff into the lungs daily.    Dispense:  120 each    Refill:  1   HYDROcodone bit-homatropine (HYCODAN) 5-1.5 MG/5ML syrup    Sig: Take 5 mLs by mouth every 8 (eight) hours as needed for cough.    Dispense:  120 mL    Refill:  0     Follow-up: Return in about 4 weeks (around 10/25/2020).  [name], MD

## 2020-10-03 ENCOUNTER — Telehealth: Payer: Self-pay | Admitting: Internal Medicine

## 2020-10-03 DIAGNOSIS — J019 Acute sinusitis, unspecified: Secondary | ICD-10-CM | POA: Diagnosis not present

## 2020-10-03 NOTE — Telephone Encounter (Signed)
   Patient calling to report she feels worse since last visit Cough, bloody mucus  Declined virtual visit Declined appointment with other provider in office  Seeking advice, please call

## 2020-10-05 ENCOUNTER — Other Ambulatory Visit: Payer: Self-pay | Admitting: Internal Medicine

## 2020-10-05 DIAGNOSIS — J189 Pneumonia, unspecified organism: Secondary | ICD-10-CM

## 2020-10-05 DIAGNOSIS — J0141 Acute recurrent pansinusitis: Secondary | ICD-10-CM | POA: Insufficient documentation

## 2020-10-05 MED ORDER — DOXYCYCLINE HYCLATE 100 MG PO TABS: 100.0000 mg | ORAL_TABLET | Freq: Two times a day (BID) | ORAL | 0 refills | Status: AC

## 2020-10-12 NOTE — Telephone Encounter (Signed)
LDVM for pt that I was able to schedule pt an apptmnt with [facility] on tomorrow 10/13/2020 at 10:40 am for her concerns with cough and bloody mucus coming up.

## 2020-10-13 ENCOUNTER — Ambulatory Visit: Payer: PPO | Admitting: Internal Medicine

## 2020-10-13 ENCOUNTER — Other Ambulatory Visit: Payer: Self-pay | Admitting: Internal Medicine

## 2020-10-13 DIAGNOSIS — E039 Hypothyroidism, unspecified: Secondary | ICD-10-CM

## 2020-10-15 ENCOUNTER — Other Ambulatory Visit: Payer: Self-pay | Admitting: Internal Medicine

## 2020-10-15 DIAGNOSIS — E039 Hypothyroidism, unspecified: Secondary | ICD-10-CM

## 2020-10-18 ENCOUNTER — Encounter: Payer: Self-pay | Admitting: Internal Medicine

## 2020-10-18 ENCOUNTER — Ambulatory Visit (INDEPENDENT_AMBULATORY_CARE_PROVIDER_SITE_OTHER): Payer: PPO

## 2020-10-18 ENCOUNTER — Other Ambulatory Visit: Payer: Self-pay | Admitting: Internal Medicine

## 2020-10-18 ENCOUNTER — Ambulatory Visit (INDEPENDENT_AMBULATORY_CARE_PROVIDER_SITE_OTHER): Payer: PPO | Admitting: Internal Medicine

## 2020-10-18 ENCOUNTER — Other Ambulatory Visit: Payer: Self-pay

## 2020-10-18 VITALS — BP 130/68 | HR 67 | Temp 98.7°F | Resp 18 | Ht 67.0 in | Wt 201.2 lb

## 2020-10-18 DIAGNOSIS — R29898 Other symptoms and signs involving the musculoskeletal system: Secondary | ICD-10-CM

## 2020-10-18 DIAGNOSIS — M5136 Other intervertebral disc degeneration, lumbar region: Secondary | ICD-10-CM | POA: Diagnosis not present

## 2020-10-18 DIAGNOSIS — M160 Bilateral primary osteoarthritis of hip: Secondary | ICD-10-CM | POA: Diagnosis not present

## 2020-10-18 DIAGNOSIS — R531 Weakness: Secondary | ICD-10-CM | POA: Diagnosis not present

## 2020-10-18 DIAGNOSIS — M47816 Spondylosis without myelopathy or radiculopathy, lumbar region: Secondary | ICD-10-CM | POA: Diagnosis not present

## 2020-10-18 LAB — CBC
HCT: 38.9 % (ref 36.0–46.0)
Hemoglobin: 12.8 g/dL (ref 12.0–15.0)
MCHC: 32.9 g/dL (ref 30.0–36.0)
MCV: 87.3 fl (ref 78.0–100.0)
Platelets: 307 10*3/uL (ref 150.0–400.0)
RBC: 4.46 Mil/uL (ref 3.87–5.11)
RDW: 14.2 % (ref 11.5–15.5)
WBC: 9.4 10*3/uL (ref 4.0–10.5)

## 2020-10-18 LAB — COMPREHENSIVE METABOLIC PANEL
ALT: 49 U/L — ABNORMAL HIGH (ref 0–35)
AST: 53 U/L — ABNORMAL HIGH (ref 0–37)
Albumin: 3.6 g/dL (ref 3.5–5.2)
Alkaline Phosphatase: 58 U/L (ref 39–117)
BUN: 18 mg/dL (ref 6–23)
CO2: 32 mEq/L (ref 19–32)
Calcium: 10 mg/dL (ref 8.4–10.5)
Chloride: 101 mEq/L (ref 96–112)
Creatinine, Ser: 0.87 mg/dL (ref 0.40–1.20)
GFR: 71.01 mL/min (ref >60.00)
Glucose, Bld: 102 mg/dL — ABNORMAL HIGH (ref 70–99)
Potassium: 3.4 mEq/L — ABNORMAL LOW (ref 3.5–5.1)
Sodium: 142 mEq/L (ref 135–145)
Total Bilirubin: 0.4 mg/dL (ref 0.2–1.2)
Total Protein: 6.4 g/dL (ref 6.0–8.3)

## 2020-10-18 LAB — VITAMIN D 25 HYDROXY (VIT D DEFICIENCY, FRACTURES): VITD: 28.27 ng/mL — ABNORMAL LOW (ref 30.00–100.00)

## 2020-10-18 LAB — TSH: TSH: 6.37 u[IU]/mL — ABNORMAL HIGH (ref 0.35–5.50)

## 2020-10-18 LAB — SEDIMENTATION RATE: Sed Rate: 29 mm/hr (ref 0–30)

## 2020-10-18 LAB — VITAMIN B12: Vitamin B-12: 180 pg/mL — ABNORMAL LOW (ref 211–911)

## 2020-10-18 LAB — CK: Total CK: 38 U/L (ref 7–177)

## 2020-10-18 NOTE — Patient Instructions (Signed)
We will check the labs and the x-ray today.  We will have you stop taking the lipitor (atorvastatin) for 2 week then let us know how you are feeling.

## 2020-10-18 NOTE — Progress Notes (Signed)
   Subjective:   Patient ID: Kirsten [name], female    DOB: 1955-09-01, 66 y.o.   MRN: [id number]  HPI The patient is a 65 YO female coming in for falls and gradually worsening leg weakness. Mostly in the upper leg muscles sometimes she cannot stand. Not sure exact onset but at least months. Has discussed with PCP before and not sure she got any answers about what this could be. She fell twice on Sunday due to being unable to stand up from commode and from chair. She has had some SI joint issues with pain but after injections is feeling better from pain. Not sure if there is weakness upper extremities she does feel her stamina is low overall. Taking eliquis without missing. No new medications recently. Denies numbness in the legs or arms. Once walking okay.   Review of Systems  Constitutional: Negative.   HENT: Negative.    Eyes: Negative.   Respiratory:  Negative for cough, chest tightness and shortness of breath.   Cardiovascular:  Negative for chest pain, palpitations and leg swelling.  Gastrointestinal:  Negative for abdominal distention, abdominal pain, constipation, diarrhea, nausea and vomiting.  Musculoskeletal:  Positive for gait problem.  Skin: Negative.   Neurological:  Positive for weakness.  Psychiatric/Behavioral: Negative.     Objective:  Physical Exam Constitutional:      Appearance: She is well-developed.  HENT:     Head: Normocephalic and atraumatic.  Cardiovascular:     Rate and Rhythm: Normal rate and regular rhythm.  Pulmonary:     Effort: Pulmonary effort is normal. No respiratory distress.     Breath sounds: Normal breath sounds. No wheezing or rales.  Abdominal:     General: Bowel sounds are normal. There is no distension.     Palpations: Abdomen is soft.     Tenderness: There is no abdominal tenderness. There is no rebound.  Musculoskeletal:     Cervical back: Normal range of motion.  Skin:    General: Skin is warm and dry.  Neurological:     Mental  Status: She is alert and oriented to person, place, and time.     Cranial Nerves: No cranial nerve deficit.     Coordination: Coordination normal.     Comments: There is weakness to 2/5 in the upper legs bilaterally, size equal of the upper legs. Lower legs bilaterally 5/5. Sensation intact upper and lower extremities.     Vitals:   10/18/20 1413  BP: 130/68  Pulse: 67  Resp: 18  Temp: 98.7 F (37.1 C)  TempSrc: Oral  SpO2: 97%  Weight: 201 lb 3.2 oz (91.3 kg)  Height: 5' 7"  (1.702 m)    This visit occurred during the SARS-CoV-2 public health emergency.  Safety protocols were in place, including screening questions prior to the visit, additional usage of staff PPE, and extensive cleaning of exam room while observing appropriate contact time as indicated for disinfecting solutions.   Assessment & Plan:

## 2020-10-19 ENCOUNTER — Encounter: Payer: Self-pay | Admitting: Internal Medicine

## 2020-10-19 ENCOUNTER — Telehealth: Payer: Self-pay

## 2020-10-19 DIAGNOSIS — R29898 Other symptoms and signs involving the musculoskeletal system: Secondary | ICD-10-CM | POA: Insufficient documentation

## 2020-10-19 DIAGNOSIS — M461 Sacroiliitis, not elsewhere classified: Secondary | ICD-10-CM | POA: Diagnosis not present

## 2020-10-19 DIAGNOSIS — I10 Essential (primary) hypertension: Secondary | ICD-10-CM | POA: Diagnosis not present

## 2020-10-19 DIAGNOSIS — Z6831 Body mass index (BMI) 31.0-31.9, adult: Secondary | ICD-10-CM | POA: Diagnosis not present

## 2020-10-19 DIAGNOSIS — M9904 Segmental and somatic dysfunction of sacral region: Secondary | ICD-10-CM | POA: Diagnosis not present

## 2020-10-19 NOTE — Telephone Encounter (Signed)
Once Dr. Sharlet Salina interprets the labs I will contact the patient.

## 2020-10-19 NOTE — Telephone Encounter (Signed)
Please advise as the pt has stated she seen that her labs were released on MyChart and is asking that she gets a call about the abnormal labs a/w her x-ray results.

## 2020-10-19 NOTE — Assessment & Plan Note (Addendum)
Sounds to be a relatively new problem which is serious and causing change in life with multiple falls recently. No acute change to suggest stroke. She could have muscle problem, statin myopathy, neurological deficit, lumbar radiculopathy. We will start workup today with x-ray lumbar and bilateral hips as well as labs including CK, CBC, ANA panel, CMP, ESR ,TSH, B12, vitamin D. If not revealing will have her stop atorvastatin for 2 weeks. If no improvement could consider MG testing.

## 2020-10-20 ENCOUNTER — Encounter: Payer: Self-pay | Admitting: Internal Medicine

## 2020-10-21 LAB — ANA,IFA RA DIAG PNL W/RFLX TIT/PATN
Anti Nuclear Antibody (ANA): NEGATIVE
Cyclic Citrullin Peptide Ab: <16 UNITS
Rheumatoid fact SerPl-aCnc: <14 IU/mL (ref <14)

## 2020-10-27 ENCOUNTER — Ambulatory Visit (INDEPENDENT_AMBULATORY_CARE_PROVIDER_SITE_OTHER): Payer: PPO

## 2020-10-27 ENCOUNTER — Other Ambulatory Visit: Payer: Self-pay

## 2020-10-27 DIAGNOSIS — M6281 Muscle weakness (generalized): Secondary | ICD-10-CM | POA: Diagnosis not present

## 2020-10-27 DIAGNOSIS — R2681 Unsteadiness on feet: Secondary | ICD-10-CM | POA: Diagnosis not present

## 2020-10-27 DIAGNOSIS — E538 Deficiency of other specified B group vitamins: Secondary | ICD-10-CM | POA: Diagnosis not present

## 2020-10-27 MED ORDER — CYANOCOBALAMIN 1000 MCG/ML IJ SOLN
1000.0000 ug | Freq: Once | INTRAMUSCULAR | Status: AC
Start: 2020-10-27 — End: 2020-10-27
  Administered 2020-10-27: 1000 ug via INTRAMUSCULAR

## 2020-10-27 NOTE — Progress Notes (Signed)
B12 given to pt w/o any complications.

## 2020-10-28 ENCOUNTER — Other Ambulatory Visit
Admission: RE | Admit: 2020-10-28 | Discharge: 2020-10-28 | Disposition: A | Discharge status: Home / Self Care | Payer: PPO | Attending: Internal Medicine | Admitting: Internal Medicine

## 2020-10-28 ENCOUNTER — Encounter: Payer: Self-pay | Admitting: Internal Medicine

## 2020-10-28 ENCOUNTER — Other Ambulatory Visit: Payer: Self-pay | Admitting: Internal Medicine

## 2020-10-28 ENCOUNTER — Ambulatory Visit: Payer: PPO | Admitting: Internal Medicine

## 2020-10-28 VITALS — BP 128/80 | HR 77 | Temp 97.1°F | Ht 67.0 in | Wt 204.4 lb

## 2020-10-28 DIAGNOSIS — R0600 Dyspnea, unspecified: Secondary | ICD-10-CM

## 2020-10-28 DIAGNOSIS — R058 Other specified cough: Secondary | ICD-10-CM

## 2020-10-28 DIAGNOSIS — R059 Cough, unspecified: Secondary | ICD-10-CM

## 2020-10-28 DIAGNOSIS — Z9989 Dependence on other enabling machines and devices: Secondary | ICD-10-CM

## 2020-10-28 DIAGNOSIS — I340 Nonrheumatic mitral (valve) insufficiency: Secondary | ICD-10-CM | POA: Diagnosis not present

## 2020-10-28 DIAGNOSIS — E039 Hypothyroidism, unspecified: Secondary | ICD-10-CM

## 2020-10-28 DIAGNOSIS — G4730 Sleep apnea, unspecified: Secondary | ICD-10-CM | POA: Diagnosis not present

## 2020-10-28 DIAGNOSIS — G4733 Obstructive sleep apnea (adult) (pediatric): Secondary | ICD-10-CM | POA: Diagnosis not present

## 2020-10-28 DIAGNOSIS — R0609 Other forms of dyspnea: Secondary | ICD-10-CM

## 2020-10-28 DIAGNOSIS — I10 Essential (primary) hypertension: Secondary | ICD-10-CM | POA: Diagnosis not present

## 2020-10-28 DIAGNOSIS — I4891 Unspecified atrial fibrillation: Secondary | ICD-10-CM | POA: Diagnosis not present

## 2020-10-28 DIAGNOSIS — I2510 Atherosclerotic heart disease of native coronary artery without angina pectoris: Secondary | ICD-10-CM | POA: Diagnosis not present

## 2020-10-28 DIAGNOSIS — R079 Chest pain, unspecified: Secondary | ICD-10-CM | POA: Diagnosis not present

## 2020-10-28 LAB — CBC WITH DIFFERENTIAL/PLATELET
Abs Immature Granulocytes: 0.03 10*3/uL (ref 0.00–0.07)
Basophils Absolute: 0 10*3/uL (ref 0.0–0.1)
Basophils Relative: 1 %
Eosinophils Absolute: 0.2 10*3/uL (ref 0.0–0.5)
Eosinophils Relative: 2 %
HCT: 40.1 % (ref 36.0–46.0)
Hemoglobin: 13.2 g/dL (ref 12.0–15.0)
Immature Granulocytes: 0 %
Lymphocytes Relative: 39 %
Lymphs Abs: 3.4 10*3/uL (ref 0.7–4.0)
MCH: 30 pg (ref 26.0–34.0)
MCHC: 32.9 g/dL (ref 30.0–36.0)
MCV: 91.1 fL (ref 80.0–100.0)
Monocytes Absolute: 0.5 10*3/uL (ref 0.1–1.0)
Monocytes Relative: 6 %
Neutro Abs: 4.5 10*3/uL (ref 1.7–7.7)
Neutrophils Relative %: 52 %
Platelets: 298 10*3/uL (ref 150–400)
RBC: 4.4 MIL/uL (ref 3.87–5.11)
RDW: 14.6 % (ref 11.5–15.5)
WBC: 8.6 10*3/uL (ref 4.0–10.5)
nRBC: 0 % (ref 0.0–0.2)

## 2020-10-28 MED ORDER — LEVOTHYROXINE SODIUM 150 MCG PO TABS: 150.0000 ug | ORAL_TABLET | Freq: Every day | ORAL | 0 refills | Status: DC

## 2020-10-28 NOTE — Assessment & Plan Note (Addendum)
Onset with H1N1 2010  - ? Asthma >  HFA 75% with coaching 09/29/10  -  09/29/10 Walked RA x 3 laps @ 185 ft each stopped due to  End of study, no desat - 10/28/2020   Walked on RA x  3  lap(s) =  approx 525 @ fast pace, stopped due to end study, mild sob  with lowest 02 sats 91%   She has significant scaring in bases s/p H1N1 2007 but also on amiodarone which might potentially be adding to her problems  Patients typically have been on amiodarone for 6-12 months before this complication manifests.  Of note, serial clinical evaluation for symptoms such as cough dyspnea or fevers is  the preferred method of monitoring for pulmonary toxicity because a decrease in DLCO or lung volumes is a nonspecific for toxicity. Pathologically amiodarone pulmonary toxicity may appear as interstitial pneumonitis, eosinophilic pneumonia, organizing pneumonia, pulmonary fibrosis or less commonly as diffuse alveolar hemorrhage, pulmonary nodules or pleural effusions.  Risk factors for pulmonary toxicity include age greater than 13, daily dose greater than equal to 400 mg, a high cumulative dose, or pre-existing lung disease so she has 2 /4 risk factors and possible a 3rd and needs to start monitoring sats at peak exercise to monitor for amiodarone toxicity   Advised: Make sure you check your oxygen saturation  at your highest level of activity  to be sure it stays over 90% and adjust  02 flow upward to maintain this level if needed but remember to turn it back to previous settings when you stop (to conserve your supply).   maint reversible issue here is conditoning and excess wt but will do pfts to complete the w/u

## 2020-10-28 NOTE — Assessment & Plan Note (Addendum)
rx per Dr Gayla Doss previously, now needs to establish with Dr Halford Chessman  > referred   Each maintenance medication was reviewed in detail including emphasizing most importantly the difference between maintenance and prns and under what circumstances the prns are to be triggered using an action plan format where appropriate.  Total time for H and P, chart review, counseling,  directly observing portions of ambulatory 02 saturation study/ and generating customized AVS unique to this new p  office visit / same day charting = 60 min

## 2020-10-28 NOTE — Progress Notes (Signed)
Kirsten [name], female    DOB: 1955-05-14   MRN: [id number]   Brief patient profile:  65 yowf never smoker with extensive abd surgery up to age 65 and residual large HH J referred to pulmonary clinic 65 in [place]  10/28/2020 by Dr  Ronnald Ramp  for doe.  Onset of breathing problems was 2010 prolonged admit for H1 N1 pna but d/c off 02 @  wt 214 and declined some since then with doe x steps / bending over making bed / walking more  than 10 min flat surface    History of Present Illness  10/28/2020  Pulmonary/ 1st office eval/ Karl Erway / [employer] / maint on trelegy  Chief Complaint  Patient presents with   Consult    Pt states f/u after pneumonia   Dyspnea:  10 min flat walking slower than others  Cough: constant watery drainge but no cough / freq flares during "sinus infections"  Sleep: bed is flat two pillows/ cpap feels fine > ramachandra was her sleep doc SABA use: none now Amiodarone x 5 years not checking sats  Covid  3 vax and omicron summer 02/2020  No obvious day to day or daytime variability or assoc excess/ purulent sputum or mucus plugs or hemoptysis or cp or chest tightness, subjective wheeze or overt sinus or hb symptoms.   Sleeping  without nocturnal  or early am exacerbation  of respiratory  c/o's or need for noct saba. Also denies any obvious fluctuation of symptoms with weather or environmental changes or other aggravating or alleviating factors except as outlined above   No unusual exposure hx or h/o childhood pna/ asthma or knowledge of premature birth.  Current Allergies, Complete Past Medical History, Past Surgical History, Family History, and Social History were reviewed in [employer] record.  ROS  The following are not active complaints unless bolded Hoarseness, sore throat, dysphagia, dental problems, itching, sneezing,  nasal congestion or discharge of excess mucus or purulent secretions, ear ache,   fever, chills, sweats, unintended wt  loss or wt gain, classically pleuritic or exertional cp,  orthopnea pnd or arm/hand swelling  or leg swelling, presyncope, palpitations, abdominal pain, anorexia, nausea, vomiting, diarrhea  or change in bowel habits or change in bladder habits, change in stools or change in urine, dysuria, hematuria,  rash, arthralgias, visual complaints, headache, numbness, weakness or ataxia or problems with walking or coordination,  change in mood or  memory.           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 CAP 02/2009  - CT chest [facility] 03/28/09 c/w PF with baseline cxr nl 03/09/09  - f/u PFT's September 20, 2009 FEV1 1.55 (57%) ratio 83 with DLC0 58% and no desat x 3 laps  - Completed rehab June 2011  Born with [employer], rotated organs abdominal repair until age 25  - sp Small Bowel Obstruction, resolved with NG/ bowel rest around 2009  Anxiety Disorder  GERD  Irritable Bowel Syndrome  Obesity     Past Medical History:  Diagnosis Date   A-fib ([place])    Anxiety disorder    Asthma    Dyspnea    Gastroschisis    umphalocele, rotated organs abd repair until age 22   GERD (gastroesophageal reflux disease)    HTN (hypertension)  Hyperlipidemia    IBS (irritable bowel syndrome)    Morbid obesity ([facility])    Target wt - 185  for BMI < 30   Obesity    OSA on CPAP    Pulmonary fibrosis ([facility])    SBO (small bowel obstruction) ([place])    Resolved with NG/Bowel rest around 2009   Sleep apnea    Type II or unspecified type diabetes mellitus without mention of complication, not stated as uncontrolled     Outpatient Medications Prior to Visit  Medication Sig Dispense Refill   albuterol (VENTOLIN HFA) 108 (90 Base) MCG/ACT inhaler Inhale 2 puffs into the lungs every 6 (six) hours as needed for wheezing or shortness  of breath. 18 g 2   amiodarone (PACERONE) 200 MG tablet Take 1 tablet (200 mg total) by mouth daily. 90 tablet 1   apixaban (ELIQUIS) 5 MG TABS tablet Take 1 tablet (5 mg total) by mouth 2 (two) times daily. 180 tablet 1   atorvastatin (LIPITOR) 40 MG tablet TAKE 1 TABLET(40 MG) BY MOUTH DAILY 90 tablet 1   Continuous Blood Gluc Receiver (FREESTYLE LIBRE 2 READER) DEVI 1 Act by Does not apply route daily. 2 each 5   Continuous Blood Gluc Sensor (FREESTYLE LIBRE 2 SENSOR) MISC 1 Act by Does not apply route daily. 2 each 5   hydrochlorothiazide (HYDRODIURIL) 25 MG tablet Take 1 tablet by mouth daily.     levothyroxine (SYNTHROID) 150 MCG tablet Take 1 tablet (150 mcg total) by mouth daily. 90 tablet 0   metFORMIN (GLUCOPHAGE) 1000 MG tablet One po BID 180 tablet 1   metoprolol succinate (TOPROL-XL) 50 MG 24 hr tablet Take 1 tablet (50 mg total) by mouth daily. 90 tablet 1   PARoxetine (PAXIL-CR) 25 MG 24 hr tablet TAKE 1 TABLET(25 MG) BY MOUTH DAILY 90 tablet 1   zolpidem (AMBIEN) 10 MG tablet TAKE ONE TABLET BY MOUTH ONE TIME DAILY 90 tablet 0   Fluticasone-Umeclidin-Vilant (TRELEGY ELLIPTA) 100-62.5-25 MCG/INH AEPB Inhale 1 puff into the lungs daily. (Patient not taking: Reported on 10/28/2020) 120 each 1   No facility-administered medications prior to visit.     Objective:     BP 128/80 (BP Location: Left Arm, Patient Position: Sitting, Cuff Size: Large)   Pulse 77   Temp (!) 97.1 F (36.2 C) (Oral)   Ht 5' 7"  (1.702 m)   Wt 204 lb 6.4 oz (92.7 kg)   SpO2 92%   BMI 32.01 kg/m   SpO2: 92 %  Amb mod obese wf nad   HEENT : pt wearing mask not removed for exam due to covid -19 concerns.    NECK :  without JVD/Nodes/TM/ nl carotid upstrokes bilaterally   LUNGS: no acc muscle use,  Nl contour chest which is clear to A and P bilaterally without cough on insp or exp maneuvers   CV:  RRR  no s3 or murmur or increase in P2, and no edema   ABD:  obese/ soft and nontender with nl  inspiratory excursion in the supine position. No bruits or organomegaly appreciated, bowel sounds nl  MS:  Nl gait/ ext warm without deformities, calf tenderness, cyanosis or clubbing No obvious joint restrictions   SKIN: warm and dry without lesions    NEURO:  alert, approp, nl sensorium with  no motor or cerebellar deficits apparent.     I personally reviewed images and agree with radiology impression as follows:  CXR:   09/27/20  1. Improved right  lung base opacity from prior exam with mild residual. Recommend continued radiographic follow-up. There is background chronic volume loss at the right lung base with eventration of the right hemidiaphragm.   I personally reviewed images and agree with radiology impression as follows:   Chest CTa 02/12/20  Lungs/Pleura: Mild atelectasis at the lung bases mostly at the base of the right lower lobe, which lies above a Bochdalek's hernia. There is prominent subpleural fat the posterior left lung base. 2 mm calcified peripheral left upper lobe granuloma. Remainder of the lungs is clear.   No pleural effusion or pneumothorax.    Assessment   Upper airway cough syndrome Onset with H1 N1 - Allergy profile 10/28/2020 >  Eos 0. /  IgE pending    - sinus CT 10/28/2020 >>>   Upper airway cough syndrome (previously labeled PNDS),  is so named because it's frequently impossible to sort out how much is  CR/sinusitis with freq throat clearing (which can be related to primary GERD)   vs  causing  secondary (" extra esophageal")  GERD from wide swings in gastric pressure that occur with throat clearing, often  promoting self use of mint and menthol lozenges that reduce the lower esophageal sphincter tone and exacerbate the problem further in a cyclical fashion.   These are the same pts (now being labeled as having "irritable larynx syndrome" by some cough centers) who not infrequently have a history of having failed to tolerate ace inhibitors,  dry  powder inhalers or biphosphonates or report having atypical/extraesophageal reflux symptoms that don't respond to standard doses of PPI  and are easily confused as having aecopd or asthma flares by even experienced allergists/ pulmonologists (myself included).   Needs eval for allergy/ sinus dz and consideration for empirical gerd rx if the prior 2 don't pan out.     DOE (dyspnea on exertion)  Onset with H1N1 2010  - ? Asthma >  HFA 75% with coaching 09/29/10  -  09/29/10 Walked RA x 3 laps @ 185 ft each stopped due to  End of study, no desat - 10/28/2020   Walked on RA x  3  lap(s) =  approx 525 @ fast pace, stopped due to end study, mild sob  with lowest 02 sats 91%   She has significant scaring in bases s/p H1N1 2007 but also on amiodarone which might potentially be adding to her problems  Patients typically have been on amiodarone for 6-12 months before this complication manifests.  Of note, serial clinical evaluation for symptoms such as cough dyspnea or fevers is  the preferred method of monitoring for pulmonary toxicity because a decrease in DLCO or lung volumes is a nonspecific for toxicity. Pathologically amiodarone pulmonary toxicity may appear as interstitial pneumonitis, eosinophilic pneumonia, organizing pneumonia, pulmonary fibrosis or less commonly as diffuse alveolar hemorrhage, pulmonary nodules or pleural effusions.  Risk factors for pulmonary toxicity include age greater than 32, daily dose greater than equal to 400 mg, a high cumulative dose, or pre-existing lung disease so she has 2 /4 risk factors and possible a 3rd and needs to start monitoring sats at peak exercise to monitor for amiodarone toxicity   Advised: Make sure you check your oxygen saturation  at your highest level of activity  to be sure it stays over 90% and adjust  02 flow upward to maintain this level if needed but remember to turn it back to previous settings when you stop (to conserve your supply).   Main  reversible issue here is conditoning and excess wt but will do pfts to complete the w/u     OSA on CPAP rx per Dr Gayla Doss previously, now needs to establish with Dr Halford Chessman      Each maintenance medication was reviewed in detail including emphasizing most importantly the difference between maintenance and prns and under what circumstances the prns are to be triggered using an action plan format where appropriate.  Total time for H and P, chart review, counseling,  directly observing portions of ambulatory 02 saturation study/ and generating customized AVS unique to this new p  office visit / same day charting = 60 min    Christinia Gully, MD 10/28/2020

## 2020-10-28 NOTE — Addendum Note (Signed)
Addended by: Claudette Head A on: 10/28/2020 01:40 PM   Modules accepted: Orders

## 2020-10-28 NOTE — Patient Instructions (Addendum)
Make sure you check your oxygen saturation at your highest level of activity to be sure it stays over 90% and keep track of it at least once a week, more often if breathing getting worse, and let me know if losing ground.   We will call to schedule a sinus CT   Please remember to go to the lab department   for your tests - we will call you with the results when they are available.     Needs pfts off trelegy and will make additional pulmonary recs at that point    Set up follow up with Dr Halford Chessman for sleep medicine

## 2020-10-28 NOTE — Assessment & Plan Note (Addendum)
Onset with H1 N1 - Allergy profile 10/28/2020 >  Eos 0. /  IgE pending    - Quant Ig 10/28/2020 pending  - sinus CT 10/28/2020 >>>   Upper airway cough syndrome (previously labeled PNDS),  is so named because it's frequently impossible to sort out how much is  CR/sinusitis with freq throat clearing (which can be related to primary GERD)   vs  causing  secondary (" extra esophageal")  GERD from wide swings in gastric pressure that occur with throat clearing, often  promoting self use of mint and menthol lozenges that reduce the lower esophageal sphincter tone and exacerbate the problem further in a cyclical fashion.   These are the same pts (now being labeled as having "irritable larynx syndrome" by some cough centers) who not infrequently have a history of having failed to tolerate ace inhibitors,  dry powder inhalers or biphosphonates or report having atypical/extraesophageal reflux symptoms that don't respond to standard doses of PPI  and are easily confused as having aecopd or asthma flares by even experienced allergists/ pulmonologists (myself included).   Needs eval for allergy/ sinus dz and consideration for empirical gerd rx if the prior 2 don't pan out.

## 2020-10-29 LAB — IGG, IGA, IGM
IgA: 155 mg/dL (ref 87–352)
IgG (Immunoglobin G), Serum: 588 mg/dL (ref 586–1602)
IgM (Immunoglobulin M), Srm: 80 mg/dL (ref 26–217)

## 2020-11-01 DIAGNOSIS — R2681 Unsteadiness on feet: Secondary | ICD-10-CM | POA: Diagnosis not present

## 2020-11-01 DIAGNOSIS — M6281 Muscle weakness (generalized): Secondary | ICD-10-CM | POA: Diagnosis not present

## 2020-11-03 LAB — IGE: IgE (Immunoglobulin E), Serum: 3 IU/mL — ABNORMAL LOW (ref 6–495)

## 2020-11-04 DIAGNOSIS — M6281 Muscle weakness (generalized): Secondary | ICD-10-CM | POA: Diagnosis not present

## 2020-11-04 DIAGNOSIS — R2681 Unsteadiness on feet: Secondary | ICD-10-CM | POA: Diagnosis not present

## 2020-11-07 DIAGNOSIS — G4733 Obstructive sleep apnea (adult) (pediatric): Secondary | ICD-10-CM | POA: Diagnosis not present

## 2020-11-07 DIAGNOSIS — R2681 Unsteadiness on feet: Secondary | ICD-10-CM | POA: Diagnosis not present

## 2020-11-07 DIAGNOSIS — M6281 Muscle weakness (generalized): Secondary | ICD-10-CM | POA: Diagnosis not present

## 2020-11-10 DIAGNOSIS — R2681 Unsteadiness on feet: Secondary | ICD-10-CM | POA: Diagnosis not present

## 2020-11-10 DIAGNOSIS — M6281 Muscle weakness (generalized): Secondary | ICD-10-CM | POA: Diagnosis not present

## 2020-11-11 ENCOUNTER — Ambulatory Visit: Payer: PPO

## 2020-11-14 DIAGNOSIS — M6281 Muscle weakness (generalized): Secondary | ICD-10-CM | POA: Diagnosis not present

## 2020-11-14 DIAGNOSIS — R2681 Unsteadiness on feet: Secondary | ICD-10-CM | POA: Diagnosis not present

## 2020-11-17 ENCOUNTER — Ambulatory Visit
Admission: RE | Admit: 2020-11-17 | Discharge: 2020-11-17 | Disposition: A | Discharge status: Home / Self Care | Payer: PPO | Source: Ambulatory Visit | Attending: Internal Medicine | Admitting: Internal Medicine

## 2020-11-17 ENCOUNTER — Other Ambulatory Visit: Payer: Self-pay | Admitting: Internal Medicine

## 2020-11-17 ENCOUNTER — Other Ambulatory Visit: Payer: Self-pay

## 2020-11-17 DIAGNOSIS — R2681 Unsteadiness on feet: Secondary | ICD-10-CM | POA: Diagnosis not present

## 2020-11-17 DIAGNOSIS — R059 Cough, unspecified: Secondary | ICD-10-CM

## 2020-11-17 DIAGNOSIS — M6281 Muscle weakness (generalized): Secondary | ICD-10-CM | POA: Diagnosis not present

## 2020-11-17 DIAGNOSIS — J0100 Acute maxillary sinusitis, unspecified: Secondary | ICD-10-CM | POA: Diagnosis not present

## 2020-11-17 DIAGNOSIS — J342 Deviated nasal septum: Secondary | ICD-10-CM | POA: Diagnosis not present

## 2020-11-18 DIAGNOSIS — R072 Precordial pain: Secondary | ICD-10-CM | POA: Diagnosis not present

## 2020-11-18 NOTE — Progress Notes (Signed)
Left detailed msg on vm with results per [facility]

## 2020-11-24 ENCOUNTER — Ambulatory Visit: Payer: PPO

## 2020-11-30 ENCOUNTER — Other Ambulatory Visit: Payer: Self-pay | Admitting: Internal Medicine

## 2020-11-30 DIAGNOSIS — E039 Hypothyroidism, unspecified: Secondary | ICD-10-CM

## 2020-12-01 ENCOUNTER — Ambulatory Visit: Payer: PPO | Admitting: Pulmonary Disease

## 2020-12-04 ENCOUNTER — Encounter: Payer: Self-pay | Admitting: Emergency Medicine

## 2020-12-04 ENCOUNTER — Other Ambulatory Visit: Payer: Self-pay

## 2020-12-04 ENCOUNTER — Inpatient Hospital Stay
Admission: EM | Admit: 2020-12-04 | Discharge: 2020-12-13 | DRG: 193 | Disposition: A | Discharge status: Home / Self Care | Payer: PPO | Attending: Internal Medicine | Admitting: Internal Medicine

## 2020-12-04 ENCOUNTER — Emergency Department: Payer: PPO

## 2020-12-04 DIAGNOSIS — Y92239 Unspecified place in hospital as the place of occurrence of the external cause: Secondary | ICD-10-CM | POA: Diagnosis not present

## 2020-12-04 DIAGNOSIS — I480 Paroxysmal atrial fibrillation: Secondary | ICD-10-CM | POA: Diagnosis present

## 2020-12-04 DIAGNOSIS — K219 Gastro-esophageal reflux disease without esophagitis: Secondary | ICD-10-CM | POA: Diagnosis not present

## 2020-12-04 DIAGNOSIS — E785 Hyperlipidemia, unspecified: Secondary | ICD-10-CM | POA: Diagnosis not present

## 2020-12-04 DIAGNOSIS — F32A Depression, unspecified: Secondary | ICD-10-CM | POA: Diagnosis present

## 2020-12-04 DIAGNOSIS — J4541 Moderate persistent asthma with (acute) exacerbation: Secondary | ICD-10-CM

## 2020-12-04 DIAGNOSIS — F419 Anxiety disorder, unspecified: Secondary | ICD-10-CM | POA: Diagnosis not present

## 2020-12-04 DIAGNOSIS — Z20822 Contact with and (suspected) exposure to covid-19: Secondary | ICD-10-CM | POA: Diagnosis not present

## 2020-12-04 DIAGNOSIS — R0602 Shortness of breath: Secondary | ICD-10-CM

## 2020-12-04 DIAGNOSIS — Z807 Family history of other malignant neoplasms of lymphoid, hematopoietic and related tissues: Secondary | ICD-10-CM | POA: Diagnosis not present

## 2020-12-04 DIAGNOSIS — F418 Other specified anxiety disorders: Secondary | ICD-10-CM | POA: Diagnosis present

## 2020-12-04 DIAGNOSIS — I517 Cardiomegaly: Secondary | ICD-10-CM | POA: Diagnosis not present

## 2020-12-04 DIAGNOSIS — Z7901 Long term (current) use of anticoagulants: Secondary | ICD-10-CM | POA: Diagnosis not present

## 2020-12-04 DIAGNOSIS — Z7984 Long term (current) use of oral hypoglycemic drugs: Secondary | ICD-10-CM | POA: Diagnosis not present

## 2020-12-04 DIAGNOSIS — G4733 Obstructive sleep apnea (adult) (pediatric): Secondary | ICD-10-CM | POA: Diagnosis not present

## 2020-12-04 DIAGNOSIS — I110 Hypertensive heart disease with heart failure: Secondary | ICD-10-CM | POA: Diagnosis present

## 2020-12-04 DIAGNOSIS — Z888 Allergy status to other drugs, medicaments and biological substances status: Secondary | ICD-10-CM

## 2020-12-04 DIAGNOSIS — J811 Chronic pulmonary edema: Secondary | ICD-10-CM | POA: Diagnosis not present

## 2020-12-04 DIAGNOSIS — E876 Hypokalemia: Secondary | ICD-10-CM | POA: Diagnosis present

## 2020-12-04 DIAGNOSIS — Z800 Family history of malignant neoplasm of digestive organs: Secondary | ICD-10-CM | POA: Diagnosis not present

## 2020-12-04 DIAGNOSIS — R059 Cough, unspecified: Secondary | ICD-10-CM | POA: Diagnosis not present

## 2020-12-04 DIAGNOSIS — E119 Type 2 diabetes mellitus without complications: Secondary | ICD-10-CM

## 2020-12-04 DIAGNOSIS — J189 Pneumonia, unspecified organism: Secondary | ICD-10-CM | POA: Diagnosis not present

## 2020-12-04 DIAGNOSIS — E039 Hypothyroidism, unspecified: Secondary | ICD-10-CM | POA: Diagnosis not present

## 2020-12-04 DIAGNOSIS — R0902 Hypoxemia: Secondary | ICD-10-CM

## 2020-12-04 DIAGNOSIS — Z9989 Dependence on other enabling machines and devices: Secondary | ICD-10-CM | POA: Diagnosis not present

## 2020-12-04 DIAGNOSIS — Z833 Family history of diabetes mellitus: Secondary | ICD-10-CM

## 2020-12-04 DIAGNOSIS — Z79899 Other long term (current) drug therapy: Secondary | ICD-10-CM

## 2020-12-04 DIAGNOSIS — Z882 Allergy status to sulfonamides status: Secondary | ICD-10-CM

## 2020-12-04 DIAGNOSIS — Z9049 Acquired absence of other specified parts of digestive tract: Secondary | ICD-10-CM

## 2020-12-04 DIAGNOSIS — I5031 Acute diastolic (congestive) heart failure: Secondary | ICD-10-CM | POA: Diagnosis not present

## 2020-12-04 DIAGNOSIS — Z79890 Hormone replacement therapy (postmenopausal): Secondary | ICD-10-CM

## 2020-12-04 DIAGNOSIS — T364X5A Adverse effect of tetracyclines, initial encounter: Secondary | ICD-10-CM | POA: Diagnosis not present

## 2020-12-04 DIAGNOSIS — J9601 Acute respiratory failure with hypoxia: Secondary | ICD-10-CM | POA: Diagnosis present

## 2020-12-04 DIAGNOSIS — J8410 Pulmonary fibrosis, unspecified: Secondary | ICD-10-CM | POA: Diagnosis present

## 2020-12-04 DIAGNOSIS — J45901 Unspecified asthma with (acute) exacerbation: Secondary | ICD-10-CM | POA: Diagnosis not present

## 2020-12-04 DIAGNOSIS — R21 Rash and other nonspecific skin eruption: Secondary | ICD-10-CM | POA: Diagnosis not present

## 2020-12-04 DIAGNOSIS — R9431 Abnormal electrocardiogram [ECG] [EKG]: Secondary | ICD-10-CM | POA: Diagnosis present

## 2020-12-04 DIAGNOSIS — Z880 Allergy status to penicillin: Secondary | ICD-10-CM

## 2020-12-04 DIAGNOSIS — J168 Pneumonia due to other specified infectious organisms: Secondary | ICD-10-CM | POA: Diagnosis not present

## 2020-12-04 DIAGNOSIS — L299 Pruritus, unspecified: Secondary | ICD-10-CM | POA: Diagnosis not present

## 2020-12-04 DIAGNOSIS — I339 Acute and subacute endocarditis, unspecified: Secondary | ICD-10-CM | POA: Diagnosis not present

## 2020-12-04 LAB — CBC
HCT: 38.8 % (ref 36.0–46.0)
Hemoglobin: 12.9 g/dL (ref 12.0–15.0)
MCH: 30.6 pg (ref 26.0–34.0)
MCHC: 33.2 g/dL (ref 30.0–36.0)
MCV: 92.2 fL (ref 80.0–100.0)
Platelets: 240 10*3/uL (ref 150–400)
RBC: 4.21 MIL/uL (ref 3.87–5.11)
RDW: 15.1 % (ref 11.5–15.5)
WBC: 10.2 10*3/uL (ref 4.0–10.5)
nRBC: 0 % (ref 0.0–0.2)

## 2020-12-04 LAB — BASIC METABOLIC PANEL
Anion gap: 11 (ref 5–15)
BUN: 12 mg/dL (ref 8–23)
CO2: 31 mmol/L (ref 22–32)
Calcium: 9.6 mg/dL (ref 8.9–10.3)
Chloride: 98 mmol/L (ref 98–111)
Creatinine, Ser: 0.7 mg/dL (ref 0.44–1.00)
GFR, Estimated: >60 mL/min (ref >60)
Glucose, Bld: 155 mg/dL — ABNORMAL HIGH (ref 70–99)
Potassium: 3.3 mmol/L — ABNORMAL LOW (ref 3.5–5.1)
Sodium: 140 mmol/L (ref 135–145)

## 2020-12-04 LAB — EXPECTORATED SPUTUM ASSESSMENT W GRAM STAIN, RFLX TO RESP C

## 2020-12-04 LAB — STREP PNEUMONIAE URINARY ANTIGEN: Strep Pneumo Urinary Antigen: NEGATIVE

## 2020-12-04 LAB — TROPONIN I (HIGH SENSITIVITY): Troponin I (High Sensitivity): 10 ng/L (ref <18)

## 2020-12-04 LAB — MAGNESIUM: Magnesium: 1.7 mg/dL (ref 1.7–2.4)

## 2020-12-04 LAB — CBG MONITORING, ED
Glucose-Capillary: 238 mg/dL — ABNORMAL HIGH (ref 70–99)
Glucose-Capillary: 243 mg/dL — ABNORMAL HIGH (ref 70–99)
Glucose-Capillary: 228 mg/dL — ABNORMAL HIGH (ref 70–99)

## 2020-12-04 LAB — RESP PANEL BY RT-PCR (FLU A&B, COVID) ARPGX2
Influenza A by PCR: NEGATIVE
Influenza B by PCR: NEGATIVE
SARS Coronavirus 2 by RT PCR: NEGATIVE

## 2020-12-04 LAB — BRAIN NATRIURETIC PEPTIDE: B Natriuretic Peptide: 66 pg/mL (ref 0.0–100.0)

## 2020-12-04 MED ORDER — ALBUTEROL SULFATE (2.5 MG/3ML) 0.083% IN NEBU
3.0000 mL | INHALATION_SOLUTION | RESPIRATORY_TRACT | Status: DC | PRN
  Administered 2020-12-05: 20:00:00 3 mL via RESPIRATORY_TRACT

## 2020-12-04 MED ORDER — DIPHENHYDRAMINE HCL 25 MG PO CAPS
25.0000 mg | ORAL_CAPSULE | Freq: Four times a day (QID) | ORAL | Status: DC | PRN
  Administered 2020-12-08: 20:00:00 25 mg via ORAL
  Filled 2020-12-04: qty 1

## 2020-12-04 MED ORDER — POTASSIUM CHLORIDE CRYS ER 20 MEQ PO TBCR
40.0000 meq | EXTENDED_RELEASE_TABLET | Freq: Once | ORAL | Status: AC
  Administered 2020-12-04: 40 meq via ORAL
  Filled 2020-12-04: qty 2

## 2020-12-04 MED ORDER — ACETAMINOPHEN 325 MG PO TABS
650.0000 mg | ORAL_TABLET | Freq: Four times a day (QID) | ORAL | Status: DC | PRN
  Administered 2020-12-04 – 2020-12-08 (×5): 650 mg via ORAL
  Filled 2020-12-04 (×5): qty 2

## 2020-12-04 MED ORDER — SODIUM CHLORIDE 0.9 % IV SOLN
500.0000 mg | INTRAVENOUS | Status: DC
  Administered 2020-12-05 – 2020-12-07 (×3): 500 mg via INTRAVENOUS
  Filled 2020-12-04 (×3): qty 500

## 2020-12-04 MED ORDER — IPRATROPIUM-ALBUTEROL 0.5-2.5 (3) MG/3ML IN SOLN
3.0000 mL | Freq: Once | RESPIRATORY_TRACT | Status: AC
  Administered 2020-12-04: 3 mL via RESPIRATORY_TRACT
  Filled 2020-12-04: qty 3

## 2020-12-04 MED ORDER — METHYLPREDNISOLONE SODIUM SUCC 40 MG IJ SOLR
40.0000 mg | Freq: Two times a day (BID) | INTRAMUSCULAR | Status: DC
  Administered 2020-12-04 – 2020-12-05 (×2): 40 mg via INTRAVENOUS
  Filled 2020-12-04 (×2): qty 1

## 2020-12-04 MED ORDER — METHYLPREDNISOLONE SODIUM SUCC 125 MG IJ SOLR
125.0000 mg | Freq: Once | INTRAMUSCULAR | Status: AC
  Administered 2020-12-04: 125 mg via INTRAVENOUS
  Filled 2020-12-04: qty 2

## 2020-12-04 MED ORDER — INSULIN ASPART 100 UNIT/ML IJ SOLN
0.0000 [IU] | Freq: Three times a day (TID) | INTRAMUSCULAR | Status: DC
  Administered 2020-12-04 – 2020-12-05 (×3): 3 [IU] via SUBCUTANEOUS
  Administered 2020-12-05: 2 [IU] via SUBCUTANEOUS
  Administered 2020-12-06: 3 [IU] via SUBCUTANEOUS
  Filled 2020-12-04 (×5): qty 1

## 2020-12-04 MED ORDER — DM-GUAIFENESIN ER 30-600 MG PO TB12
1.0000 | ORAL_TABLET | Freq: Two times a day (BID) | ORAL | Status: DC | PRN
  Administered 2020-12-05 – 2020-12-13 (×4): 1 via ORAL
  Filled 2020-12-04 (×4): qty 1

## 2020-12-04 MED ORDER — PAROXETINE HCL ER 12.5 MG PO TB24
25.0000 mg | ORAL_TABLET | Freq: Every day | ORAL | Status: DC
  Administered 2020-12-05 – 2020-12-12 (×8): 25 mg via ORAL
  Filled 2020-12-04 (×10): qty 2

## 2020-12-04 MED ORDER — HYDROCHLOROTHIAZIDE 25 MG PO TABS
25.0000 mg | ORAL_TABLET | Freq: Every day | ORAL | Status: DC
  Administered 2020-12-04 – 2020-12-13 (×10): 25 mg via ORAL
  Filled 2020-12-04 (×10): qty 1

## 2020-12-04 MED ORDER — SODIUM CHLORIDE 0.9 % IV SOLN
500.0000 mg | Freq: Once | INTRAVENOUS | Status: AC
  Administered 2020-12-04: 500 mg via INTRAVENOUS
  Filled 2020-12-04: qty 500

## 2020-12-04 MED ORDER — CEFTRIAXONE SODIUM 1 G IJ SOLR
1.0000 g | INTRAMUSCULAR | Status: AC
Start: 2020-12-05 — End: 2020-12-10
  Administered 2020-12-05 – 2020-12-10 (×6): 1 g via INTRAVENOUS
  Filled 2020-12-04 (×5): qty 1
  Filled 2020-12-04: qty 10

## 2020-12-04 MED ORDER — LEVOTHYROXINE SODIUM 50 MCG PO TABS
150.0000 ug | ORAL_TABLET | Freq: Every day | ORAL | Status: DC
  Administered 2020-12-05 – 2020-12-13 (×9): 150 ug via ORAL
  Filled 2020-12-04 (×9): qty 1

## 2020-12-04 MED ORDER — ZOLPIDEM TARTRATE 5 MG PO TABS
5.0000 mg | ORAL_TABLET | Freq: Every evening | ORAL | Status: DC | PRN
  Administered 2020-12-04 – 2020-12-12 (×8): 5 mg via ORAL
  Filled 2020-12-04 (×8): qty 1

## 2020-12-04 MED ORDER — ACETAMINOPHEN 325 MG PO TABS
650.0000 mg | ORAL_TABLET | Freq: Once | ORAL | Status: AC
  Administered 2020-12-04: 650 mg via ORAL
  Filled 2020-12-04: qty 2

## 2020-12-04 MED ORDER — HYDRALAZINE HCL 20 MG/ML IJ SOLN: 5.0000 mg | INTRAMUSCULAR | Status: DC | PRN

## 2020-12-04 MED ORDER — METOPROLOL SUCCINATE ER 50 MG PO TB24
50.0000 mg | ORAL_TABLET | Freq: Every day | ORAL | Status: DC
  Administered 2020-12-04 – 2020-12-13 (×10): 50 mg via ORAL
  Filled 2020-12-04 (×10): qty 1

## 2020-12-04 MED ORDER — IPRATROPIUM-ALBUTEROL 0.5-2.5 (3) MG/3ML IN SOLN
3.0000 mL | RESPIRATORY_TRACT | Status: DC
  Administered 2020-12-04 – 2020-12-05 (×4): 3 mL via RESPIRATORY_TRACT
  Filled 2020-12-04 (×6): qty 3

## 2020-12-04 MED ORDER — SODIUM CHLORIDE 0.9 % IV SOLN
1.0000 g | Freq: Once | INTRAVENOUS | Status: AC
  Administered 2020-12-04: 1 g via INTRAVENOUS
  Filled 2020-12-04: qty 10

## 2020-12-04 MED ORDER — AMIODARONE HCL 200 MG PO TABS
200.0000 mg | ORAL_TABLET | Freq: Every day | ORAL | Status: DC
  Administered 2020-12-04 – 2020-12-13 (×10): 200 mg via ORAL
  Filled 2020-12-04 (×10): qty 1

## 2020-12-04 MED ORDER — INSULIN ASPART 100 UNIT/ML IJ SOLN
0.0000 [IU] | Freq: Every day | INTRAMUSCULAR | Status: DC
  Administered 2020-12-04 – 2020-12-05 (×2): 2 [IU] via SUBCUTANEOUS
  Filled 2020-12-04 (×2): qty 1

## 2020-12-04 MED ORDER — APIXABAN 5 MG PO TABS
5.0000 mg | ORAL_TABLET | Freq: Two times a day (BID) | ORAL | Status: DC
  Administered 2020-12-04 – 2020-12-13 (×19): 5 mg via ORAL
  Filled 2020-12-04 (×19): qty 1

## 2020-12-04 NOTE — H&P (Signed)
History and Physical    Kirsten [name] ENI:[id number] DOB: 1955-04-12 DOA: 12/04/2020  Referring MD/NP/PA:   PCP: [name], MD   Patient coming from:  The patient is coming from home.  At baseline, pt is independent for most of ADL.        Chief Complaint: SOB  HPI: Kirsten [name] is a 65 y.o. female with medical history significant of hypertension, hyperlipidemia, diabetes mellitus, asthma, GERD, OSA on CPAP, hypothyroidism, pulmonary fibrosis, IBS, atrial fibrillation on Eliquis, s/p of gastroschisis repair, who presents with shortness of breath.  Patient states that she has shortness of breath for more than 5 days, which has been progressively worsening.  Patient has cough with yellow sputum production.  Patient also reports low grade subjective fever and chills.  No chest pain.  Denies nausea, vomiting, diarrhea or abdominal pain.  No symptoms of UTI.  Patient stated he has been consistently taking Eliquis, last dose was yesterday.  Patient is not wearing oxygen normally, but was found to have oxygen desaturating to 86% on room air.  Currently 92% on 4 L oxygen.  ED Course: pt was found to have WBC 10.2, troponin level 10, negative COVID PCR, potassium 3.3, renal function okay, temperature normal, blood pressure 124/56, heart rate 77, RR 24, chest x-ray showed increased left basilar opacity.  Patient is admitted to progressive bed as inpatient.  Review of Systems:   General: has fevers, chills, no body weight gain, has fatigue HEENT: no blurry vision, hearing changes or sore throat Respiratory: has dyspnea, coughing, wheezing CV: no chest pain, no palpitations GI: no nausea, vomiting, abdominal pain, diarrhea, constipation GU: no dysuria, burning on urination, increased urinary frequency, hematuria  Ext: no leg edema Neuro: no unilateral weakness, numbness, or tingling, no vision change or hearing loss Skin: no rash, no skin tear. MSK: No muscle spasm, no deformity, no  limitation of range of movement in spin Heme: No easy bruising.  Travel history: No recent long distant travel.  Allergy:  Allergies  Allergen Reactions   Factive [Gemifloxacin Mesylate] Rash   Crestor [Rosuvastatin]     GI upset   Sulfonamide Derivatives     REACTION: rash   Gemifloxacin Rash   Penicillins Hives and Rash    Has patient had a PCN reaction causing immediate rash, facial/tongue/throat swelling, SOB or lightheadedness with hypotension: No Has patient had a PCN reaction causing severe rash involving mucus membranes or skin necrosis: No Has patient had a PCN reaction that required hospitalization No Has patient had a PCN reaction occurring within the last 10 years: No If all of the above answers are "NO", then may proceed with Cephalosporin use.     Past Medical History:  Diagnosis Date   A-fib ([place])    Anxiety disorder    Asthma    Dyspnea    Gastroschisis    umphalocele, rotated organs abd repair until age 50   GERD (gastroesophageal reflux disease)    HTN (hypertension)    Hyperlipidemia    IBS (irritable bowel syndrome)    Morbid obesity (HCC)    Target wt - 185  for BMI < 30   Obesity    OSA on CPAP    Pulmonary fibrosis (HCC)    SBO (small bowel obstruction) ([place])    Resolved with NG/Bowel rest around 2009   Sleep apnea    Type II or unspecified type diabetes mellitus without mention of complication, not stated as uncontrolled  Past Surgical History:  Procedure Laterality Date   BREAST EXCISIONAL BIOPSY Left 03/19/2012   neg   CESAREAN SECTION     CHOLECYSTECTOMY  1992   COLONOSCOPY  2011   2011-normal   Newborn Surgery - GI - ORGANS OUTSIDE ABDOMEN     Small Bowel Repair     TUBAL LIGATION  1988    Social History:  reports that she has never smoked. She has never used smokeless tobacco. She reports current alcohol use of about 2.0 standard drinks per week. She reports that she does not use drugs.  Family History:  Family History   Problem Relation Age of Onset   Lymphoma Mother    Diabetes type II Father    Colon cancer Father    Diabetes type I Son    Diabetes Maternal Grandmother    Goiter Neg Hx    Breast cancer Neg Hx    Ovarian cancer Neg Hx      Prior to Admission medications   Medication Sig Start Date End Date Taking? Authorizing Provider  albuterol (VENTOLIN HFA) 108 (90 Base) MCG/ACT inhaler Inhale 2 puffs into the lungs every 6 (six) hours as needed for wheezing or shortness of breath. 02/11/19   Biagio Borg, MD  amiodarone (PACERONE) 200 MG tablet Take 1 tablet (200 mg total) by mouth daily. 03/07/20   Max Sane, MD  apixaban (ELIQUIS) 5 MG TABS tablet Take 1 tablet (5 mg total) by mouth 2 (two) times daily. 05/23/20   [name], MD  atorvastatin (LIPITOR) 40 MG tablet TAKE 1 TABLET(40 MG) BY MOUTH DAILY 09/27/20   [name], MD  Continuous Blood Gluc Receiver (FREESTYLE LIBRE 2 READER) DEVI 1 Act by Does not apply route daily. 07/19/20   [name], MD  Continuous Blood Gluc Sensor (FREESTYLE LIBRE 2 SENSOR) MISC 1 Act by Does not apply route daily. 07/19/20   [name], MD  Fluticasone-Umeclidin-Vilant (TRELEGY ELLIPTA) 100-62.5-25 MCG/INH AEPB Inhale 1 puff into the lungs daily. Patient not taking: Reported on 10/28/2020 09/27/20   [name], MD  hydrochlorothiazide (HYDRODIURIL) 25 MG tablet Take 1 tablet by mouth daily. 05/27/20   [provider]  levothyroxine (SYNTHROID) 150 MCG tablet Take 1 tablet (150 mcg total) by mouth daily. 10/28/20   [name], MD  metFORMIN (GLUCOPHAGE) 1000 MG tablet One po BID 07/20/20   [name], MD  metoprolol succinate (TOPROL-XL) 50 MG 24 hr tablet Take 1 tablet (50 mg total) by mouth daily. 11/16/19   [name], MD  PARoxetine (PAXIL-CR) 25 MG 24 hr tablet TAKE 1 TABLET(25 MG) BY MOUTH DAILY 07/20/20   [name], MD  zolpidem (AMBIEN) 10 MG tablet TAKE ONE TABLET BY MOUTH ONE TIME DAILY 09/04/20   [name],  MD    Physical Exam: Vitals:   12/04/20 0715 12/04/20 0730 12/04/20 0800 12/04/20 0830  BP: (!) 138/116 116/77 (!) 124/59 (!) 124/56  Pulse: 79 84 83 77  Resp: 17 (!) 21 18 19   Temp:      TempSrc:      SpO2: 98% 100% 97% 98%  Weight:      Height:       General: Not in acute distress HEENT:       Eyes: PERRL, EOMI, no scleral icterus.       ENT: No discharge from the ears and nose, no pharynx injection, no tonsillar enlargement.  Neck: No JVD, no bruit, no mass felt. Heme: No neck lymph node enlargement. Cardiac: S1/S2, RRR, No murmurs, No gallops or rubs. Respiratory: Has diffuse wheezing bilaterally GI: Soft, nondistended, nontender, no rebound pain, no organomegaly, BS present. GU: No hematuria Ext: No pitting leg edema bilaterally. 1+DP/PT pulse bilaterally. Musculoskeletal: No joint deformities, No joint redness or warmth, no limitation of ROM in spin. Skin: No rashes.  Neuro: Alert, oriented X3, cranial nerves II-XII grossly intact, moves all extremities normally.  Psych: Patient is not psychotic, no suicidal or hemocidal ideation.  Labs on Admission: I have personally reviewed following labs and imaging studies  CBC: Recent Labs  Lab 12/04/20 0718  WBC 10.2  HGB 12.9  HCT 38.8  MCV 92.2  PLT 694   Basic Metabolic Panel: Recent Labs  Lab 12/04/20 0718 12/04/20 0901  NA 140  --   K 3.3*  --   CL 98  --   CO2 31  --   GLUCOSE 155*  --   BUN 12  --   CREATININE 0.70  --   CALCIUM 9.6  --   MG  --  1.7   GFR: Estimated Creatinine Clearance: 83.2 mL/min (by C-G formula based on SCr of 0.7 mg/dL). Liver Function Tests: No results for input(s): AST, ALT, ALKPHOS, BILITOT, PROT, ALBUMIN in the last 168 hours. No results for input(s): LIPASE, AMYLASE in the last 168 hours. No results for input(s): AMMONIA in the last 168 hours. Coagulation Profile: No results for input(s): INR, PROTIME in the last 168 hours. Cardiac Enzymes: No results for  input(s): CKTOTAL, CKMB, CKMBINDEX, TROPONINI in the last 168 hours. BNP (last 3 results) No results for input(s): PROBNP in the last 8760 hours. HbA1C: No results for input(s): HGBA1C in the last 72 hours. CBG: No results for input(s): GLUCAP in the last 168 hours. Lipid Profile: No results for input(s): CHOL, HDL, LDLCALC, TRIG, CHOLHDL, LDLDIRECT in the last 72 hours. Thyroid Function Tests: No results for input(s): TSH, T4TOTAL, FREET4, T3FREE, THYROIDAB in the last 72 hours. Anemia Panel: No results for input(s): VITAMINB12, FOLATE, FERRITIN, TIBC, IRON, RETICCTPCT in the last 72 hours. Urine analysis:    Component Value Date/Time   COLORURINE YELLOW 07/19/2020 1411   APPEARANCEUR Sl Cloudy (A) 07/19/2020 1411   APPEARANCEUR Cloudy (A) 01/01/2017 1403   LABSPEC 1.020 07/19/2020 1411   PHURINE 6.0 07/19/2020 1411   GLUCOSEU NEGATIVE 07/19/2020 1411   HGBUR NEGATIVE 07/19/2020 1411   HGBUR trace-lysed 12/20/2009 1107   BILIRUBINUR NEGATIVE 07/19/2020 1411   BILIRUBINUR neg 06/06/2020 1553   BILIRUBINUR Negative 01/01/2017 1403   KETONESUR TRACE (A) 07/19/2020 1411   PROTEINUR Negative 09/07/2019 1442   PROTEINUR 1+ (A) 01/01/2017 1403   PROTEINUR NEGATIVE 07/12/2010 0738   UROBILINOGEN 0.2 07/19/2020 1411   NITRITE POSITIVE (A) 07/19/2020 1411   LEUKOCYTESUR LARGE (A) 07/19/2020 1411   Sepsis Labs: @LABRCNTIP (procalcitonin:4,lacticidven:4) ) Recent Results (from the past 240 hour(s))  Resp Panel by RT-PCR (Flu A&B, Covid) Nasopharyngeal Swab     Status: None   Collection Time: 12/04/20  7:24 AM   Specimen: Nasopharyngeal Swab; Nasopharyngeal(NP) swabs in vial transport medium  Result Value Ref Range Status   SARS Coronavirus 2 by RT PCR NEGATIVE NEGATIVE Final    Comment: (NOTE) SARS-CoV-2 target nucleic acids are NOT DETECTED.  The SARS-CoV-2 RNA is generally detectable in upper respiratory specimens during the acute phase of infection. The lowest concentration of  SARS-CoV-2 viral copies this assay can detect is 138  copies/mL. A negative result does not preclude SARS-Cov-2 infection and should not be used as the sole basis for treatment or other patient management decisions. A negative result may occur with  improper specimen collection/handling, submission of specimen other than nasopharyngeal swab, presence of viral mutation(s) within the areas targeted by this assay, and inadequate number of viral copies(<138 copies/mL). A negative result must be combined with clinical observations, patient history, and epidemiological information. The expected result is Negative.  Fact Sheet for Patients:  EntrepreneurPulse.com.au  Fact Sheet for Healthcare Providers:  IncredibleEmployment.be  This test is no t yet approved or cleared by the [place] FDA and  has been authorized for detection and/or diagnosis of SARS-CoV-2 by FDA under an Emergency Use Authorization (EUA). This EUA will remain  in effect (meaning this test can be used) for the duration of the COVID-19 declaration under Section 564(b)(1) of the Act, 21 U.S.C.section 360bbb-3(b)(1), unless the authorization is terminated  or revoked sooner.       Influenza A by PCR NEGATIVE NEGATIVE Final   Influenza B by PCR NEGATIVE NEGATIVE Final    Comment: (NOTE) The Xpert Xpress SARS-CoV-2/FLU/RSV plus assay is intended as an aid in the diagnosis of influenza from Nasopharyngeal swab specimens and should not be used as a sole basis for treatment. Nasal washings and aspirates are unacceptable for Xpert Xpress SARS-CoV-2/FLU/RSV testing.  Fact Sheet for Patients: EntrepreneurPulse.com.au  Fact Sheet for Healthcare Providers: IncredibleEmployment.be  This test is not yet approved or cleared by the [place] FDA and has been authorized for detection and/or diagnosis of SARS-CoV-2 by FDA under an Emergency Use  Authorization (EUA). This EUA will remain in effect (meaning this test can be used) for the duration of the COVID-19 declaration under Section 564(b)(1) of the Act, 21 U.S.C. section 360bbb-3(b)(1), unless the authorization is terminated or revoked.  Performed at [facility], [address] [place], [place] [address]      Radiological Exams on Admission: DG Chest 2 View  Result Date: 12/04/2020 CLINICAL DATA:  65 year old female with shortness of breath, cough. EXAM: CHEST - 2 VIEW COMPARISON:  09/27/2020 chest radiographs and earlier. FINDINGS: Chronic volume loss in the right hemithorax, not significantly changed from a chest CT last year. Stable lung volumes and mediastinal contours. Chronic elevation of the right hemidiaphragm and patchy right lung base opacity is stable since August. Streaky left lung base opacity has mildly increased. No pneumothorax, pulmonary edema or pleural effusion. Visualized tracheal air column is within normal limits. No acute osseous abnormality identified. IMPRESSION: 1. Difficult to exclude acute infection at the left lung base, where opacity has increased but is superimposed on chronic atelectasis or scarring. No pleural effusion. 2. Ongoing volume loss in the right chest, not significantly changed from a CT last year which demonstrated no obstructing airway lesion. Electronically Signed   By: Genevie Ann M.D.   On: 12/04/2020 07:59     EKG: I have personally reviewed.  Sinus rhythm, QTC 503, LAD, left bundle block.  This seems to have already existed in previous EKG  Assessment/Plan Principal Problem:   CAP (community acquired pneumonia) Active Problems:   Hypothyroidism   Depression with anxiety   OSA on CPAP   Paroxysmal atrial fibrillation (HCC)   Pulmonary fibrosis (HCC)   Diabetes mellitus without complication (HCC)   Asthma exacerbation   Acute respiratory failure with hypoxia ([place])   Hyperlipidemia   Hypokalemia  Acute respiratory  failure with hypoxia due to CAP and asthma  exacerbation in the setting of Pulmonary fibrosis: Patient has new oxygen requirement, currently on 4 L oxygen.  Does not meet criteria for sepsis.  Hemodynamically stable.  -Admitted to progressive unit as inpatient - IV Rocephin and azithromycin - Solu-Medrol 40 mg twice daily - Mucinex for cough  - Bronchodilators - Urine legionella and S. pneumococcal antigen - Follow up blood culture x2, sputum culture  Hypothyroidism -Synthroid  Depression with anxiety -Continue home medications  OSA  -on CPAP  Paroxysmal atrial fibrillation (HCC) -Continue amiodarone, metoprolol -Continue Eliquis  Diabetes mellitus without complication ([place]): Recent A1c 6.7, well controlled.  Patient taking metformin -Sliding scale insulin  Hyperlipidemia: Patient is not taking Lipitor currently -Follow-up with PCP  Hypokalemia: Potassium 3.3 -Repleted potassium -Check magnesium level         DVT ppx: eliquis Code Status: Full code Family Communication: not done, no family member is at bed side.   Disposition Plan:  Anticipate discharge back to previous environment Consults called:  nono Admission status and Level of care: Progressive Cardiac:    as inpt       Status is: Inpatient  Remains inpatient appropriate because: Patient has multiple comorbidities, including asthma, pulmonary fibrosis, now presents with acute respiratory failure with hypoxia due to community-acquired pneumonia, asthma exacerbation in the setting of pulmonary fibrosis.  Patient has new oxygen requirement.  Her presentation is highly complicated.  Patient is at high risk of deteriorating.  Need to be treated in hospital for at least 2 days.          Date of Service 12/04/2020    Ivor Costa [facility]   If 7PM-7AM, please contact night-coverage [email or web address] 12/04/2020, 10:41 AM

## 2020-12-04 NOTE — ED Provider Notes (Signed)
[facility] Emergency Department Provider Note  Time seen: 7:10 AM  I have reviewed the triage vital signs and the nursing notes.   HISTORY  Chief Complaint Shortness of Breath and Cough   HPI Kirsten [name] is a 65 y.o. female with a past medical history of asthma, gastric reflux, hypertension, hyperlipidemia, presents to the emergency department for shortness of breath cough congestion.  According to the patient for the past 1 week she has had intermittent fever cough congestion consistent with a "cold."  Patient states over the past 24 hours however she has developed worsening shortness of breath along with sputum production.  Denies any vomiting, chest pain or abdominal pain.  Patient states she always gets short of breath when she develops colds.   Past Medical History:  Diagnosis Date   A-fib ([place])    Anxiety disorder    Asthma    Dyspnea    Gastroschisis    umphalocele, rotated organs abd repair until age 60   GERD (gastroesophageal reflux disease)    HTN (hypertension)    Hyperlipidemia    IBS (irritable bowel syndrome)    Morbid obesity (HCC)    Target wt - 185  for BMI < 30   Obesity    OSA on CPAP    Pulmonary fibrosis (HCC)    SBO (small bowel obstruction) ([place])    Resolved with NG/Bowel rest around 2009   Sleep apnea    Type II or unspecified type diabetes mellitus without mention of complication, not stated as uncontrolled     Patient Active Problem List   Diagnosis Date Noted   Upper airway cough syndrome 10/28/2020   Weakness of both lower extremities 10/19/2020   Acute recurrent pansinusitis 10/05/2020   Aspiration pneumonia of right lower lobe ([place]) 09/27/2020   Pneumonia of right lower lobe due to infectious organism 09/27/2020   Acute cystitis with hematuria 08/02/2020   Frequent UTI 08/02/2020   Spinal stenosis, lumbar region with neurogenic claudication 12/31/2019   DDD (degenerative disc disease), lumbar 08/11/2019    Primary osteoarthritis of right hip 08/11/2019   Urinary urgency 08/10/2019   NASH (nonalcoholic steatohepatitis) 06/02/2019   Diabetes mellitus without complication ([place]) 25/00/3704   Colon cancer screening 12/25/2017   Estrogen deficiency 12/25/2017   Pulmonary fibrosis ([place])    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 03/21/2017   Insomnia w/ sleep apnea 03/21/2017   Class 1 obesity with serious comorbidity and body mass index (BMI) of 34.0 to 34.9 in adult 01/31/2016   Vaginal atrophy 01/13/2015   Insomnia due to anxiety and fear 10/11/2014   GAD (generalized anxiety disorder) 10/11/2014   Paroxysmal atrial fibrillation ([place]) 04/08/2014   Hyperlipidemia with target LDL less than 100 10/22/2012   Asthma, mild persistent 04/06/2011   Hypertriglyceridemia 01/29/2011   DOE (dyspnea on exertion) 09/30/2010   Vitamin B 12 deficiency 06/28/2009   Hypothyroidism 06/20/2009   Depression with anxiety 06/20/2009   Type II diabetes mellitus with manifestations ([place]) 04/08/2009   Essential hypertension, benign 04/08/2009   OSA on CPAP 04/08/2009    Past Surgical History:  Procedure Laterality Date   BREAST EXCISIONAL BIOPSY Left 03/19/2012   neg   CESAREAN SECTION     CHOLECYSTECTOMY  1992   COLONOSCOPY  2011   2011-normal   Newborn Surgery - GI - ORGANS OUTSIDE ABDOMEN     Small Bowel Repair     TUBAL LIGATION  1988    Prior to Admission medications  Medication Sig Start Date End Date Taking? Authorizing Provider  albuterol (VENTOLIN HFA) 108 (90 Base) MCG/ACT inhaler Inhale 2 puffs into the lungs every 6 (six) hours as needed for wheezing or shortness of breath. 02/11/19   Biagio Borg, MD  amiodarone (PACERONE) 200 MG tablet Take 1 tablet (200 mg total) by mouth daily. 03/07/20   Max Sane, MD  apixaban (ELIQUIS) 5 MG TABS tablet Take 1 tablet (5 mg total) by mouth 2 (two) times daily. 05/23/20   [name], MD  atorvastatin (LIPITOR) 40 MG tablet TAKE 1 TABLET(40 MG) BY  MOUTH DAILY 09/27/20   [name], MD  Continuous Blood Gluc Receiver (FREESTYLE LIBRE 2 READER) DEVI 1 Act by Does not apply route daily. 07/19/20   [name], MD  Continuous Blood Gluc Sensor (FREESTYLE LIBRE 2 SENSOR) MISC 1 Act by Does not apply route daily. 07/19/20   [name], MD  Fluticasone-Umeclidin-Vilant (TRELEGY ELLIPTA) 100-62.5-25 MCG/INH AEPB Inhale 1 puff into the lungs daily. Patient not taking: Reported on 10/28/2020 09/27/20   [name], MD  hydrochlorothiazide (HYDRODIURIL) 25 MG tablet Take 1 tablet by mouth daily. 05/27/20   [provider]  levothyroxine (SYNTHROID) 150 MCG tablet Take 1 tablet (150 mcg total) by mouth daily. 10/28/20   [name], MD  metFORMIN (GLUCOPHAGE) 1000 MG tablet One po BID 07/20/20   [name], MD  metoprolol succinate (TOPROL-XL) 50 MG 24 hr tablet Take 1 tablet (50 mg total) by mouth daily. 11/16/19   [name], MD  PARoxetine (PAXIL-CR) 25 MG 24 hr tablet TAKE 1 TABLET(25 MG) BY MOUTH DAILY 07/20/20   [name], MD  zolpidem (AMBIEN) 10 MG tablet TAKE ONE TABLET BY MOUTH ONE TIME DAILY 09/04/20   [name], MD    Allergies  Allergen Reactions   Factive [Gemifloxacin Mesylate] Rash   Crestor [Rosuvastatin]     GI upset   Sulfonamide Derivatives     REACTION: rash   Gemifloxacin Rash   Penicillins Hives and Rash    Has patient had a PCN reaction causing immediate rash, facial/tongue/throat swelling, SOB or lightheadedness with hypotension: No Has patient had a PCN reaction causing severe rash involving mucus membranes or skin necrosis: No Has patient had a PCN reaction that required hospitalization No Has patient had a PCN reaction occurring within the last 10 years: No If all of the above answers are "NO", then may proceed with Cephalosporin use.     Family History  Problem Relation Age of Onset   Lymphoma Mother    Diabetes type II Father    Colon cancer Father    Diabetes type I  Son    Diabetes Maternal Grandmother    Goiter Neg Hx    Breast cancer Neg Hx    Ovarian cancer Neg Hx     Social History Social History   Tobacco Use   Smoking status: Never   Smokeless tobacco: Never   Tobacco comments:    "tried as a teen"  Vaping Use   Vaping Use: Never used  Substance Use Topics   Alcohol use: Yes    Alcohol/week: 2.0 standard drinks    Types: 2 Glasses of wine per week    Comment: rare   Drug use: No    Review of Systems Constitutional: Intermittent fever over the past 1 week.  Positive for congestion. Cardiovascular: Negative for chest pain. Respiratory: Positive for shortness of breath.  Positive  for productive cough, with green-yellow sputum. Gastrointestinal: Negative for abdominal pain, vomiting and diarrhea. Genitourinary: Negative for urinary compaints Musculoskeletal: Negative for musculoskeletal complaints Neurological: Negative for headache All other ROS negative  ____________________________________________   PHYSICAL EXAM:  VITAL SIGNS: ED Triage Vitals  Enc Vitals Group     BP 12/04/20 0646 114/87     Pulse Rate 12/04/20 0646 86     Resp 12/04/20 0646 (!) 24     Temp 12/04/20 0646 98.6 F (37 C)     Temp Source 12/04/20 0646 Oral     SpO2 12/04/20 0646 (!) 86 %     Weight 12/04/20 0648 200 lb (90.7 kg)     Height 12/04/20 0648 5' 7"  (1.702 m)     Head Circumference --      Peak Flow --      Pain Score 12/04/20 0646 0     Pain Loc --      Pain Edu? --      Excl. in [place]? --    Constitutional: Alert and oriented. Well appearing and in no distress. Eyes: Normal exam ENT      Head: Normocephalic and atraumatic.      Mouth/Throat: Mucous membranes are moist. Cardiovascular: Normal rate, regular rhythm. Respiratory: Mild tachypnea, rhonchi on exam that largely clears with cough.  Slight expiratory wheeze. Gastrointestinal: Soft and nontender. No distention.   Musculoskeletal: Nontender with normal range of motion in all  extremities.  Neurologic:  Normal speech and language. No gross focal neurologic deficits  Skin:  Skin is warm, dry and intact.  Psychiatric: Mood and affect are normal.   ____________________________________________    EKG  EKG viewed and interpreted by myself shows a normal sinus rhythm at 78 bpm with a widened QRS, normal axis, normal intervals, nonspecific ST changes.  ____________________________________________    RADIOLOGY  Chest x-ray shows possible left lower lobe infection.  ____________________________________________   INITIAL IMPRESSION / ASSESSMENT AND PLAN / ED COURSE  Pertinent labs & imaging results that were available during my care of the patient were reviewed by me and considered in my medical decision making (see chart for details).   Patient presents to the emergency department for cough congestion found to be hypoxic to 86% on room air with mild tachypnea.  Patient has rhonchi with slight expiratory wheeze on exam, rhonchi largely clears with coughing.  Patient currently on 4 L satting in the mid upper 90s.  No baseline O2 requirement.  Denies any chest pain.  Does state 1 week of intermittent fever cough congestion.  We will check labs, chest x-ray, COVID swab.  We will treat with duo nebs and Solu-Medrol while awaiting further results as I do suspect a degree of reactive airway disease in addition to URI or possible pneumonia.  Patient agreeable to plan of care.  Chest x-ray shows possible left lower lobe infection.  COVID/flu negative.  Given the patient's hypoxia will cover with antibiotics and admit to the hospital service for further work-up and treatment.  Kirsten [name] was evaluated in Emergency Department on 12/04/2020 for the symptoms described in the history of present illness. She was evaluated in the context of the global COVID-19 pandemic, which necessitated consideration that the patient might be at risk for infection with the SARS-CoV-2 virus  that causes COVID-19. Institutional protocols and algorithms that pertain to the evaluation of patients at risk for COVID-19 are in a state of rapid change based on information released by regulatory bodies including the  CDC and federal and [employer]. These policies and algorithms were followed during the patient's care in the ED.  ____________________________________________   FINAL CLINICAL IMPRESSION(S) / ED DIAGNOSES  Hypoxia Community-acquired pneumonia   Harvest Dark, MD 12/04/20 [phone]

## 2020-12-04 NOTE — ED Triage Notes (Addendum)
Pt arrived via POV with c/o cold sxs since last Tuesday, states overnight started having shortness of breath, cough present, low-grade fevers at home.   Denies any chest pain. Pt has hx of asthma and chronic bronchitis

## 2020-12-05 LAB — CBG MONITORING, ED: Glucose-Capillary: 199 mg/dL — ABNORMAL HIGH (ref 70–99)

## 2020-12-05 LAB — LEGIONELLA PNEUMOPHILA SEROGP 1 UR AG: L. pneumophila Serogp 1 Ur Ag: NEGATIVE

## 2020-12-05 LAB — BASIC METABOLIC PANEL
Anion gap: 9 (ref 5–15)
BUN: 16 mg/dL (ref 8–23)
CO2: 32 mmol/L (ref 22–32)
Calcium: 10 mg/dL (ref 8.9–10.3)
Chloride: 97 mmol/L — ABNORMAL LOW (ref 98–111)
Creatinine, Ser: 0.6 mg/dL (ref 0.44–1.00)
GFR, Estimated: >60 mL/min (ref >60)
Glucose, Bld: 191 mg/dL — ABNORMAL HIGH (ref 70–99)
Potassium: 3.7 mmol/L (ref 3.5–5.1)
Sodium: 138 mmol/L (ref 135–145)

## 2020-12-05 LAB — GLUCOSE, CAPILLARY
Glucose-Capillary: 218 mg/dL — ABNORMAL HIGH (ref 70–99)
Glucose-Capillary: 220 mg/dL — ABNORMAL HIGH (ref 70–99)

## 2020-12-05 LAB — MAGNESIUM: Magnesium: 1.9 mg/dL (ref 1.7–2.4)

## 2020-12-05 LAB — HIV ANTIBODY (ROUTINE TESTING W REFLEX): HIV Screen 4th Generation wRfx: NONREACTIVE

## 2020-12-05 MED ORDER — PREDNISONE 20 MG PO TABS
40.0000 mg | ORAL_TABLET | Freq: Every day | ORAL | Status: DC
  Administered 2020-12-06 – 2020-12-13 (×8): 40 mg via ORAL
  Filled 2020-12-05 (×8): qty 2

## 2020-12-05 MED ORDER — METHYLPREDNISOLONE SODIUM SUCC 40 MG IJ SOLR
40.0000 mg | Freq: Two times a day (BID) | INTRAMUSCULAR | Status: AC
  Administered 2020-12-05: 40 mg via INTRAVENOUS
  Filled 2020-12-05: qty 1

## 2020-12-05 NOTE — ED Notes (Signed)
Informed RN bed assigned 

## 2020-12-05 NOTE — ED Notes (Signed)
Pt ambulated to toilet and back.

## 2020-12-05 NOTE — Progress Notes (Signed)
Patient arrived to unit from ER in stable condition.

## 2020-12-05 NOTE — ED Notes (Signed)
Pt complaint of feeling "feverish". RN assessed temp of 98.9. Pt asked for tylenol regardless of fever.

## 2020-12-05 NOTE — ED Notes (Signed)
RN to bedside to introduce self to pt. Pt sleeping at this time.

## 2020-12-05 NOTE — Progress Notes (Signed)
PROGRESS NOTE    TARENA GOCKLEY  RDE:[id number] DOB: 06-Aug-1955 DOA: 12/04/2020 PCP: [name], MD  Outpatient Specialists: [facility]    Brief Narrative:   CAMBREY LUPI is a 65 y.o. female with medical history significant of hypertension, hyperlipidemia, diabetes mellitus, asthma, GERD, OSA on CPAP, hypothyroidism, pulmonary fibrosis, IBS, atrial fibrillation on Eliquis, s/p of gastroschisis repair, who presents with shortness of breath.   Patient states that she has shortness of breath for more than 5 days, which has been progressively worsening.  Patient has cough with yellow sputum production.  Patient also reports low grade subjective fever and chills.  No chest pain.  Denies nausea, vomiting, diarrhea or abdominal pain.  No symptoms of UTI.  Patient stated he has been consistently taking Eliquis, last dose was yesterday.  Patient is not wearing oxygen normally, but was found to have oxygen desaturating to 86% on room air.  Currently 92% on 4 L oxygen.   Assessment & Plan:   Principal Problem:   CAP (community acquired pneumonia) Active Problems:   Hypothyroidism   Depression with anxiety   OSA on CPAP   Paroxysmal atrial fibrillation (HCC)   Pulmonary fibrosis (HCC)   Diabetes mellitus without complication (HCC)   Asthma exacerbation   Acute respiratory failure with hypoxia (HCC)   Hyperlipidemia   Hypokalemia   Acute respiratory failure with hypoxia due to CAP and asthma exacerbation in the setting of Pulmonary fibrosis: Patient has new oxygen requirement, requiring 4 L on arrival for O2s in the 80s. This morning dyspnea somewhat improved, O2 at bedside off o2 90-95. Pt reportedly febrile at home,with several days dyspnea and productive cough. Cxr with possible infiltrate. Followed by pulm as outpt - monitor O2, resume o2 as needed.  - cont azithromycin and rocephin, transition to orals at time of d/c - cont steroids, de-escalate to prednisone today -  f/u urine antigens, sputum and blood cultures - close outpt pulm f/u  Hypothyroidism -Synthroid   Depression with anxiety -Continue home medications   OSA  -on CPAP   Paroxysmal atrial fibrillation (HCC) -Continue amiodarone, metoprolol -Continue Eliquis   Diabetes mellitus without complication ([place]): Recent A1c 6.7, well controlled.  Patient taking metformin. Here glucose mildly elevated in setting of steroids -Sliding scale insulin   Hyperlipidemia: Patient is not taking Lipitor currently -Follow-up with PCP   Hypokalemia: Potassium 3.3 on admit -Repleted potassium - repeat pending   DVT prophylaxis: lovenox Code Status: full Family Communication: husband updated @ bedside 10/24  Level of care: med/surg Status is: Inpatient  Remains inpt appropriate: severity of illness        Consultants:  none  Procedures: none  Antimicrobials:  Azithromycin/ceftriaxone    Subjective: This morning feels lungs clearing somewhat, still with productive cough, some dyspnea. No chest pain.   Objective: Vitals:   12/05/20 0100 12/05/20 0318 12/05/20 0400 12/05/20 0700  BP:  123/63 (!) 110/59 (!) 110/59  Pulse: 66 70 66 68  Resp: 16 18 17 16   Temp:      TempSrc:      SpO2: 96% 97% 100% 99%  Weight:      Height:       No intake or output data in the 24 hours ending 12/05/20 0827 Filed Weights   12/04/20 0648  Weight: 90.7 kg    Examination:  General exam: Appears calm and comfortable  Respiratory system: few scattered rhonchi Cardiovascular system: S1 & S2 heard, RRR. No JVD, murmurs, rubs, gallops or  clicks. No pedal edema. Gastrointestinal system: Abdomen is nondistended, soft and nontender. No organomegaly or masses felt. Normal bowel sounds heard. Central nervous system: Alert and oriented. No focal neurological deficits. Extremities: Symmetric 5 x 5 power. Skin: No rashes, lesions or ulcers Psychiatry: Judgement and insight appear normal. Mood &  affect appropriate.     Data Reviewed: I have personally reviewed following labs and imaging studies  CBC: Recent Labs  Lab 12/04/20 0718  WBC 10.2  HGB 12.9  HCT 38.8  MCV 92.2  PLT 628   Basic Metabolic Panel: Recent Labs  Lab 12/04/20 0718 12/04/20 0901 12/05/20 0625  NA 140  --  138  K 3.3*  --  3.7  CL 98  --  97*  CO2 31  --  32  GLUCOSE 155*  --  191*  BUN 12  --  16  CREATININE 0.70  --  0.60  CALCIUM 9.6  --  10.0  MG  --  1.7  --    GFR: Estimated Creatinine Clearance: 83.2 mL/min (by C-G formula based on SCr of 0.6 mg/dL). Liver Function Tests: No results for input(s): AST, ALT, ALKPHOS, BILITOT, PROT, ALBUMIN in the last 168 hours. No results for input(s): LIPASE, AMYLASE in the last 168 hours. No results for input(s): AMMONIA in the last 168 hours. Coagulation Profile: No results for input(s): INR, PROTIME in the last 168 hours. Cardiac Enzymes: No results for input(s): CKTOTAL, CKMB, CKMBINDEX, TROPONINI in the last 168 hours. BNP (last 3 results) No results for input(s): PROBNP in the last 8760 hours. HbA1C: No results for input(s): HGBA1C in the last 72 hours. CBG: Recent Labs  Lab 12/04/20 1145 12/04/20 1749 12/04/20 2103 12/05/20 0813  GLUCAP 238* 243* 228* 199*   Lipid Profile: No results for input(s): CHOL, HDL, LDLCALC, TRIG, CHOLHDL, LDLDIRECT in the last 72 hours. Thyroid Function Tests: No results for input(s): TSH, T4TOTAL, FREET4, T3FREE, THYROIDAB in the last 72 hours. Anemia Panel: No results for input(s): VITAMINB12, FOLATE, FERRITIN, TIBC, IRON, RETICCTPCT in the last 72 hours. Urine analysis:    Component Value Date/Time   COLORURINE YELLOW 07/19/2020 1411   APPEARANCEUR Sl Cloudy (A) 07/19/2020 1411   APPEARANCEUR Cloudy (A) 01/01/2017 1403   LABSPEC 1.020 07/19/2020 1411   PHURINE 6.0 07/19/2020 1411   GLUCOSEU NEGATIVE 07/19/2020 1411   HGBUR NEGATIVE 07/19/2020 1411   HGBUR trace-lysed 12/20/2009 1107    BILIRUBINUR NEGATIVE 07/19/2020 1411   BILIRUBINUR neg 06/06/2020 1553   BILIRUBINUR Negative 01/01/2017 1403   KETONESUR TRACE (A) 07/19/2020 1411   PROTEINUR Negative 09/07/2019 1442   PROTEINUR 1+ (A) 01/01/2017 1403   PROTEINUR NEGATIVE 07/12/2010 0738   UROBILINOGEN 0.2 07/19/2020 1411   NITRITE POSITIVE (A) 07/19/2020 1411   LEUKOCYTESUR LARGE (A) 07/19/2020 1411   Sepsis Labs: @LABRCNTIP (procalcitonin:4,lacticidven:4)  ) Recent Results (from the past 240 hour(s))  Resp Panel by RT-PCR (Flu A&B, Covid) Nasopharyngeal Swab     Status: None   Collection Time: 12/04/20  7:24 AM   Specimen: Nasopharyngeal Swab; Nasopharyngeal(NP) swabs in vial transport medium  Result Value Ref Range Status   SARS Coronavirus 2 by RT PCR NEGATIVE NEGATIVE Final    Comment: (NOTE) SARS-CoV-2 target nucleic acids are NOT DETECTED.  The SARS-CoV-2 RNA is generally detectable in upper respiratory specimens during the acute phase of infection. The lowest concentration of SARS-CoV-2 viral copies this assay can detect is 138 copies/mL. A negative result does not preclude SARS-Cov-2 infection and should not be used as  the sole basis for treatment or other patient management decisions. A negative result may occur with  improper specimen collection/handling, submission of specimen other than nasopharyngeal swab, presence of viral mutation(s) within the areas targeted by this assay, and inadequate number of viral copies(<138 copies/mL). A negative result must be combined with clinical observations, patient history, and epidemiological information. The expected result is Negative.  Fact Sheet for Patients:  EntrepreneurPulse.com.au  Fact Sheet for Healthcare Providers:  IncredibleEmployment.be  This test is no t yet approved or cleared by the [place] FDA and  has been authorized for detection and/or diagnosis of SARS-CoV-2 by FDA under an Emergency Use  Authorization (EUA). This EUA will remain  in effect (meaning this test can be used) for the duration of the COVID-19 declaration under Section 564(b)(1) of the Act, 21 U.S.C.section 360bbb-3(b)(1), unless the authorization is terminated  or revoked sooner.       Influenza A by PCR NEGATIVE NEGATIVE Final   Influenza B by PCR NEGATIVE NEGATIVE Final    Comment: (NOTE) The Xpert Xpress SARS-CoV-2/FLU/RSV plus assay is intended as an aid in the diagnosis of influenza from Nasopharyngeal swab specimens and should not be used as a sole basis for treatment. Nasal washings and aspirates are unacceptable for Xpert Xpress SARS-CoV-2/FLU/RSV testing.  Fact Sheet for Patients: EntrepreneurPulse.com.au  Fact Sheet for Healthcare Providers: IncredibleEmployment.be  This test is not yet approved or cleared by the [place] FDA and has been authorized for detection and/or diagnosis of SARS-CoV-2 by FDA under an Emergency Use Authorization (EUA). This EUA will remain in effect (meaning this test can be used) for the duration of the COVID-19 declaration under Section 564(b)(1) of the Act, 21 U.S.C. section 360bbb-3(b)(1), unless the authorization is terminated or revoked.  Performed at [facility], [address] [place], [place] [address]   Blood culture (routine x 2)     Status: None (Preliminary result)   Collection Time: 12/04/20  9:01 AM   Specimen: BLOOD LEFT HAND  Result Value Ref Range Status   Specimen Description BLOOD LEFT HAND  Final   Special Requests   Final    BOTTLES DRAWN AEROBIC AND ANAEROBIC Blood Culture results may not be optimal due to an inadequate volume of blood received in culture bottles   Culture   Final    NO GROWTH < 24 HOURS Performed at [facility], [address] [place], [place] [address]    Report Status PENDING  Incomplete  Blood culture (routine x 2)     Status: None (Preliminary result)    Collection Time: 12/04/20  9:01 AM   Specimen: BLOOD RIGHT HAND  Result Value Ref Range Status   Specimen Description BLOOD RIGHT HAND  Final   Special Requests   Final    BOTTLES DRAWN AEROBIC AND ANAEROBIC Blood Culture adequate volume   Culture   Final    NO GROWTH < 24 HOURS Performed at [facility], [address], [place], [place] [address]    Report Status PENDING  Incomplete  Expectorated Sputum Assessment w Gram Stain, Rflx to Resp Cult     Status: None   Collection Time: 12/04/20  1:41 PM   Specimen: Sputum  Result Value Ref Range Status   Specimen Description SPUTUM  Final   Special Requests NONE  Final   Sputum evaluation   Final    THIS SPECIMEN IS ACCEPTABLE FOR SPUTUM CULTURE Performed at [facility], [address] [place], [place]  [address]    Report Status 12/04/2020 FINAL  Final  Culture, Respiratory w Gram Stain     Status: None (Preliminary result)   Collection Time: 12/04/20  1:41 PM   Specimen: SPU  Result Value Ref Range Status   Specimen Description   Final    SPUTUM Performed at [facility], [address], [place], [place] [address]    Special Requests   Final    NONE Reflexed from [id number] Performed at [facility], [address] [place], [place] [address]    Gram Stain   Final    FEW SQUAMOUS EPITHELIAL CELLS PRESENT FEW WBC SEEN FEW GRAM POSITIVE COCCI Performed at [facility], [place] [address] [place], [place] [address]    Culture PENDING  Incomplete   Report Status PENDING  Incomplete         Radiology Studies: DG Chest 2 View  Result Date: 12/04/2020 CLINICAL DATA:  65 year old female with shortness of breath, cough. EXAM: CHEST - 2 VIEW COMPARISON:  09/27/2020 chest radiographs and earlier. FINDINGS: Chronic volume loss in the right hemithorax, not significantly changed from a chest CT last year. Stable lung volumes and mediastinal contours. Chronic elevation of the right  hemidiaphragm and patchy right lung base opacity is stable since August. Streaky left lung base opacity has mildly increased. No pneumothorax, pulmonary edema or pleural effusion. Visualized tracheal air column is within normal limits. No acute osseous abnormality identified. IMPRESSION: 1. Difficult to exclude acute infection at the left lung base, where opacity has increased but is superimposed on chronic atelectasis or scarring. No pleural effusion. 2. Ongoing volume loss in the right chest, not significantly changed from a CT last year which demonstrated no obstructing airway lesion. Electronically Signed   By: Genevie Ann M.D.   On: 12/04/2020 07:59        Scheduled Meds:  amiodarone  200 mg Oral Daily   apixaban  5 mg Oral BID   hydrochlorothiazide  25 mg Oral Daily   insulin aspart  0-5 Units Subcutaneous QHS   insulin aspart  0-9 Units Subcutaneous TID WC   ipratropium-albuterol  3 mL Nebulization Q4H   levothyroxine  150 mcg Oral Daily   methylPREDNISolone (SOLU-MEDROL) injection  40 mg Intravenous Q12H   metoprolol succinate  50 mg Oral Daily   PARoxetine  25 mg Oral Daily   Continuous Infusions:  azithromycin (ZITHROMAX) 500 MG IVPB (Vial-Mate [employer])     cefTRIAXone (ROCEPHIN)  IV       LOS: 1 day    Time spent: 40 min    Desma Maxim, MD [facility]   If 7PM-7AM, please contact night-coverage [email or web address] Password [facility] 12/05/2020, 8:27 AM

## 2020-12-06 ENCOUNTER — Ambulatory Visit: Payer: PPO | Admitting: Internal Medicine

## 2020-12-06 DIAGNOSIS — J189 Pneumonia, unspecified organism: Secondary | ICD-10-CM | POA: Diagnosis not present

## 2020-12-06 LAB — GLUCOSE, CAPILLARY
Glucose-Capillary: 214 mg/dL — ABNORMAL HIGH (ref 70–99)
Glucose-Capillary: 153 mg/dL — ABNORMAL HIGH (ref 70–99)
Glucose-Capillary: 173 mg/dL — ABNORMAL HIGH (ref 70–99)
Glucose-Capillary: 251 mg/dL — ABNORMAL HIGH (ref 70–99)

## 2020-12-06 LAB — BASIC METABOLIC PANEL
Anion gap: 7 (ref 5–15)
BUN: 21 mg/dL (ref 8–23)
CO2: 32 mmol/L (ref 22–32)
Calcium: 9.8 mg/dL (ref 8.9–10.3)
Chloride: 98 mmol/L (ref 98–111)
Creatinine, Ser: 0.66 mg/dL (ref 0.44–1.00)
GFR, Estimated: >60 mL/min (ref >60)
Glucose, Bld: 234 mg/dL — ABNORMAL HIGH (ref 70–99)
Potassium: 4.1 mmol/L (ref 3.5–5.1)
Sodium: 137 mmol/L (ref 135–145)

## 2020-12-06 LAB — PHOSPHORUS: Phosphorus: 3.2 mg/dL (ref 2.5–4.6)

## 2020-12-06 LAB — MAGNESIUM: Magnesium: 2 mg/dL (ref 1.7–2.4)

## 2020-12-06 LAB — HEMOGLOBIN A1C
Hgb A1c MFr Bld: 6.3 % — ABNORMAL HIGH (ref 4.8–5.6)
Mean Plasma Glucose: 134.11 mg/dL

## 2020-12-06 LAB — CULTURE, RESPIRATORY W GRAM STAIN: Culture: NORMAL

## 2020-12-06 MED ORDER — INSULIN ASPART 100 UNIT/ML IJ SOLN
0.0000 [IU] | Freq: Every day | INTRAMUSCULAR | Status: DC
  Administered 2020-12-06: 3 [IU] via SUBCUTANEOUS
  Administered 2020-12-07: 21:00:00 2 [IU] via SUBCUTANEOUS
  Administered 2020-12-10: 3 [IU] via SUBCUTANEOUS
  Administered 2020-12-11: 21:00:00 4 [IU] via SUBCUTANEOUS
  Administered 2020-12-12: 3 [IU] via SUBCUTANEOUS
  Filled 2020-12-06 (×4): qty 1

## 2020-12-06 MED ORDER — INSULIN ASPART 100 UNIT/ML IJ SOLN
0.0000 [IU] | Freq: Three times a day (TID) | INTRAMUSCULAR | Status: DC
  Administered 2020-12-06 (×2): 3 [IU] via SUBCUTANEOUS
  Administered 2020-12-07: 2 [IU] via SUBCUTANEOUS
  Administered 2020-12-07: 5 [IU] via SUBCUTANEOUS
  Administered 2020-12-08: 16:00:00 15 [IU] via SUBCUTANEOUS
  Administered 2020-12-08: 5 [IU] via SUBCUTANEOUS
  Administered 2020-12-09 (×2): 3 [IU] via SUBCUTANEOUS
  Administered 2020-12-09: 8 [IU] via SUBCUTANEOUS
  Administered 2020-12-10: 2 [IU] via SUBCUTANEOUS
  Administered 2020-12-11: 12:00:00 3 [IU] via SUBCUTANEOUS
  Administered 2020-12-11: 17:00:00 11 [IU] via SUBCUTANEOUS
  Administered 2020-12-11: 5 [IU] via SUBCUTANEOUS
  Administered 2020-12-12: 12:00:00 3 [IU] via SUBCUTANEOUS
  Administered 2020-12-12: 18:00:00 15 [IU] via SUBCUTANEOUS
  Administered 2020-12-13: 13:00:00 2 [IU] via SUBCUTANEOUS
  Administered 2020-12-13: 5 [IU] via SUBCUTANEOUS
  Filled 2020-12-06 (×18): qty 1

## 2020-12-06 MED ORDER — IPRATROPIUM-ALBUTEROL 0.5-2.5 (3) MG/3ML IN SOLN
3.0000 mL | Freq: Three times a day (TID) | RESPIRATORY_TRACT | Status: DC
  Administered 2020-12-06 – 2020-12-10 (×12): 3 mL via RESPIRATORY_TRACT
  Filled 2020-12-06 (×13): qty 3

## 2020-12-06 MED ORDER — GUAIFENESIN ER 600 MG PO TB12
600.0000 mg | ORAL_TABLET | Freq: Two times a day (BID) | ORAL | Status: DC
  Administered 2020-12-06 – 2020-12-13 (×14): 600 mg via ORAL
  Filled 2020-12-06 (×14): qty 1

## 2020-12-06 NOTE — Progress Notes (Signed)
Inpatient Diabetes Program Recommendations  AACE/ADA: New Consensus Statement on Inpatient Glycemic Control (2015)  Target Ranges:  Prepandial:   less than 140 mg/dL      Peak postprandial:   less than 180 mg/dL (1-2 hours)      Critically ill patients:  140 - 180 mg/dL   Lab Results  Component Value Date   GLUCAP 214 (H) 12/06/2020   HGBA1C 6.7 (H) 07/19/2020   Review of Glycemic Control Results for Kirsten [name], Kirsten [name] (MRN [id number]) as of 12/06/2020 08:44  Ref. Range 12/05/2020 08:13 12/05/2020 16:09 12/05/2020 21:26 12/06/2020 07:19  Glucose-Capillary Latest Ref Range: 70 - 99 mg/dL 199 (H) 218 (H) 220 (H) 214 (H)   Diabetes history: DM2 Outpatient Diabetes medications: Metformin 1 gm bid Current orders for Inpatient glycemic control: Novolog correction 0-9 units tid, 0-5 hs  Inpatient Diabetes Program Recommendations:   Consider: -Increase in Novolog correction to 0-15 units tid, 0-5 units hs Secure chat sent to Dr. Dwyane [name].  Thank you, Nani Gasser. Calina Patrie, RN, MSN, CDE  Diabetes Coordinator Inpatient Glycemic Control Team Team Pager [phone] (8am-5pm) 12/06/2020 8:45 AM

## 2020-12-06 NOTE — Progress Notes (Signed)
PROGRESS NOTE    Kirsten [name]  TAV:[id number] DOB: [birth year] DOA: 12/04/2020 PCP: [name], MD  Outpatient Specialists: [facility]    Brief Narrative:   Kirsten [name] is a 66 y.o. female with medical history significant of hypertension, hyperlipidemia, diabetes mellitus, asthma, GERD, OSA on CPAP, hypothyroidism, pulmonary fibrosis, IBS, atrial fibrillation on Eliquis, s/p of gastroschisis repair, who presents with shortness of breath.   Patient states that she has shortness of breath for more than 5 days, which has been progressively worsening.  Patient has cough with yellow sputum production.  Patient also reports low grade subjective fever and chills.  No chest pain.  Denies nausea, vomiting, diarrhea or abdominal pain.  No symptoms of UTI.  Patient stated he has been consistently taking Eliquis, last dose was yesterday.  Patient is not wearing oxygen normally, but was found to have oxygen desaturating to 86% on room air.  Currently 92% on 4 L oxygen.   Assessment & Plan:   Principal Problem:   CAP (community acquired pneumonia) Active Problems:   Hypothyroidism   Depression with anxiety   OSA on CPAP   Paroxysmal atrial fibrillation (HCC)   Pulmonary fibrosis (HCC)   Diabetes mellitus without complication (HCC)   Asthma exacerbation   Acute respiratory failure with hypoxia (HCC)   Hyperlipidemia   Hypokalemia   Acute respiratory failure with hypoxia due to CAP and asthma exacerbation in the setting of Pulmonary fibrosis:  Pt reportedly febrile at home,with several days dyspnea and productive cough. Cxr with possible infiltrate. Followed by pulm as outpt Continue supplemental O2 admission and gradually wean off - monitor O2, resume o2 as needed.  - cont azithromycin and rocephin, transition to orals at time of d/c - cont steroids, de-escalate to prednisone 40 mg p.o. daily on 10/24 - f/u urine antigens, sputum and blood cultures - close outpt pulm  f/u   Hypothyroidism -Synthroid   Depression with anxiety -Continue home medications   OSA  -on CPAP   Paroxysmal atrial fibrillation (HCC) -Continue amiodarone, metoprolol -Continue Eliquis   Diabetes mellitus without complication ([place]): Recent A1c 6.7, well controlled.  Patient taking metformin. Here glucose mildly elevated in setting of steroids -Sliding scale insulin   Hyperlipidemia: Patient is not taking Lipitor currently -Follow-up with PCP   Hypokalemia: Potassium 3.3 on admit -Repleted potassium - repeat k 4.1 wnl   DVT prophylaxis: lovenox Code Status: full Family Communication: husband updated @ bedside 10/24  Level of care: med/surg Status is: Inpatient  Remains inpt appropriate: severity of illness        Consultants:  none  Procedures: none  Antimicrobials:  Azithromycin/ceftriaxone    Subjective: No significant overnight events, patient still has significant dyspnea on exertion and oxygen drops.  Patient was complaining of mild productive cough. Patient denies any chest pain or palpitations, no any other active issues.   Objective: Vitals:   12/06/20 0717 12/06/20 0726 12/06/20 1100 12/06/20 1133  BP: (!) 124/59   132/77  Pulse: 74   71  Resp: 20   20  Temp: 98 F (36.7 C)   98.2 F (36.8 C)  TempSrc:    Oral  SpO2: 91% 91% (!) 88% 94%  Weight:      Height:        Intake/Output Summary (Last 24 hours) at 12/06/2020 1403 Last data filed at 12/06/2020 1017 Gross per 24 hour  Intake 240 ml  Output --  Net 240 ml   [employer]  12/04/20 0648  Weight: 90.7 kg    Examination:  General exam: Appears calm and comfortable  Respiratory system: Mild wheezing bilaterally Cardiovascular system: S1 & S2 heard, RRR. No JVD, murmurs, rubs, gallops or clicks. No pedal edema. Gastrointestinal system: Abdomen is nondistended, soft and nontender. No organomegaly or masses felt. Normal bowel sounds heard. Central nervous system:  Alert and oriented. No focal neurological deficits. Extremities: Symmetric 5 x 5 power. Skin: No rashes, lesions or ulcers Psychiatry: Judgement and insight appear normal. Mood & affect appropriate.     Data Reviewed: I have personally reviewed following labs and imaging studies  CBC: Recent Labs  Lab 12/04/20 0718  WBC 10.2  HGB 12.9  HCT 38.8  MCV 92.2  PLT 802   Basic Metabolic Panel: Recent Labs  Lab 12/04/20 0718 12/04/20 0901 12/05/20 0625 12/06/20 0536  NA 140  --  138 137  K 3.3*  --  3.7 4.1  CL 98  --  97* 98  CO2 31  --  32 32  GLUCOSE 155*  --  191* 234*  BUN 12  --  16 21  CREATININE 0.70  --  0.60 0.66  CALCIUM 9.6  --  10.0 9.8  MG  --  1.7 1.9 2.0  PHOS  --   --   --  3.2   GFR: Estimated Creatinine Clearance: 83.2 mL/min (by C-G formula based on SCr of 0.66 mg/dL). Liver Function Tests: No results for input(s): AST, ALT, ALKPHOS, BILITOT, PROT, ALBUMIN in the last 168 hours. No results for input(s): LIPASE, AMYLASE in the last 168 hours. No results for input(s): AMMONIA in the last 168 hours. Coagulation Profile: No results for input(s): INR, PROTIME in the last 168 hours. Cardiac Enzymes: No results for input(s): CKTOTAL, CKMB, CKMBINDEX, TROPONINI in the last 168 hours. BNP (last 3 results) No results for input(s): PROBNP in the last 8760 hours. HbA1C: Recent Labs    12/06/20 0917  HGBA1C 6.3*   CBG: Recent Labs  Lab 12/05/20 0813 12/05/20 1609 12/05/20 2126 12/06/20 0719 12/06/20 1135  GLUCAP 199* 218* 220* 214* 153*   Lipid Profile: No results for input(s): CHOL, HDL, LDLCALC, TRIG, CHOLHDL, LDLDIRECT in the last 72 hours. Thyroid Function Tests: No results for input(s): TSH, T4TOTAL, FREET4, T3FREE, THYROIDAB in the last 72 hours. Anemia Panel: No results for input(s): VITAMINB12, FOLATE, FERRITIN, TIBC, IRON, RETICCTPCT in the last 72 hours. Urine analysis:    Component Value Date/Time   COLORURINE YELLOW 07/19/2020 1411    APPEARANCEUR Sl Cloudy (A) 07/19/2020 1411   APPEARANCEUR Cloudy (A) 01/01/2017 1403   LABSPEC 1.020 07/19/2020 1411   PHURINE 6.0 07/19/2020 1411   GLUCOSEU NEGATIVE 07/19/2020 1411   HGBUR NEGATIVE 07/19/2020 1411   HGBUR trace-lysed 12/20/2009 1107   BILIRUBINUR NEGATIVE 07/19/2020 1411   BILIRUBINUR neg 06/06/2020 1553   BILIRUBINUR Negative 01/01/2017 1403   KETONESUR TRACE (A) 07/19/2020 1411   PROTEINUR Negative 09/07/2019 1442   PROTEINUR 1+ (A) 01/01/2017 1403   PROTEINUR NEGATIVE 07/12/2010 0738   UROBILINOGEN 0.2 07/19/2020 1411   NITRITE POSITIVE (A) 07/19/2020 1411   LEUKOCYTESUR LARGE (A) 07/19/2020 1411   Sepsis Labs: @LABRCNTIP (procalcitonin:4,lacticidven:4)  ) Recent Results (from the past 240 hour(s))  Resp Panel by RT-PCR (Flu A&B, Covid) Nasopharyngeal Swab     Status: None   Collection Time: 12/04/20  7:24 AM   Specimen: Nasopharyngeal Swab; Nasopharyngeal(NP) swabs in vial transport medium  Result Value Ref Range Status   SARS Coronavirus 2 by  RT PCR NEGATIVE NEGATIVE Final    Comment: (NOTE) SARS-CoV-2 target nucleic acids are NOT DETECTED.  The SARS-CoV-2 RNA is generally detectable in upper respiratory specimens during the acute phase of infection. The lowest concentration of SARS-CoV-2 viral copies this assay can detect is 138 copies/mL. A negative result does not preclude SARS-Cov-2 infection and should not be used as the sole basis for treatment or other patient management decisions. A negative result may occur with  improper specimen collection/handling, submission of specimen other than nasopharyngeal swab, presence of viral mutation(s) within the areas targeted by this assay, and inadequate number of viral copies(<138 copies/mL). A negative result must be combined with clinical observations, patient history, and epidemiological information. The expected result is Negative.  Fact Sheet for Patients:   EntrepreneurPulse.com.au  Fact Sheet for Healthcare Providers:  IncredibleEmployment.be  This test is no t yet approved or cleared by the [place] FDA and  has been authorized for detection and/or diagnosis of SARS-CoV-2 by FDA under an Emergency Use Authorization (EUA). This EUA will remain  in effect (meaning this test can be used) for the duration of the COVID-19 declaration under Section 564(b)(1) of the Act, 21 U.S.C.section 360bbb-3(b)(1), unless the authorization is terminated  or revoked sooner.       Influenza A by PCR NEGATIVE NEGATIVE Final   Influenza B by PCR NEGATIVE NEGATIVE Final    Comment: (NOTE) The Xpert Xpress SARS-CoV-2/FLU/RSV plus assay is intended as an aid in the diagnosis of influenza from Nasopharyngeal swab specimens and should not be used as a sole basis for treatment. Nasal washings and aspirates are unacceptable for Xpert Xpress SARS-CoV-2/FLU/RSV testing.  Fact Sheet for Patients: EntrepreneurPulse.com.au  Fact Sheet for Healthcare Providers: IncredibleEmployment.be  This test is not yet approved or cleared by the [place] FDA and has been authorized for detection and/or diagnosis of SARS-CoV-2 by FDA under an Emergency Use Authorization (EUA). This EUA will remain in effect (meaning this test can be used) for the duration of the COVID-19 declaration under Section 564(b)(1) of the Act, 21 U.S.C. section 360bbb-3(b)(1), unless the authorization is terminated or revoked.  Performed at [facility], [address] [place], [place] [address]   Blood culture (routine x 2)     Status: None (Preliminary result)   Collection Time: 12/04/20  9:01 AM   Specimen: BLOOD LEFT HAND  Result Value Ref Range Status   Specimen Description BLOOD LEFT HAND  Final   Special Requests   Final    BOTTLES DRAWN AEROBIC AND ANAEROBIC Blood Culture results may not be  optimal due to an inadequate volume of blood received in culture bottles   Culture   Final    NO GROWTH 2 DAYS Performed at [facility], [address], [place], [place] [address]    Report Status PENDING  Incomplete  Blood culture (routine x 2)     Status: None (Preliminary result)   Collection Time: 12/04/20  9:01 AM   Specimen: BLOOD RIGHT HAND  Result Value Ref Range Status   Specimen Description BLOOD RIGHT HAND  Final   Special Requests   Final    BOTTLES DRAWN AEROBIC AND ANAEROBIC Blood Culture adequate volume   Culture   Final    NO GROWTH 2 DAYS Performed at [facility], [address] [place], [place] [address]    Report Status PENDING  Incomplete  Expectorated Sputum Assessment w Gram Stain, Rflx to Resp Cult     Status: None  Collection Time: 12/04/20  1:41 PM   Specimen: Sputum  Result Value Ref Range Status   Specimen Description SPUTUM  Final   Special Requests NONE  Final   Sputum evaluation   Final    THIS SPECIMEN IS ACCEPTABLE FOR SPUTUM CULTURE Performed at [facility], [address], [place], [place] [address]    Report Status 12/04/2020 FINAL  Final  Culture, Respiratory w Gram Stain     Status: None   Collection Time: 12/04/20  1:41 PM   Specimen: SPU  Result Value Ref Range Status   Specimen Description   Final    SPUTUM Performed at [facility], [address], [place], [place] [address]    Special Requests   Final    NONE Reflexed from [id number] Performed at [facility], [address] [place], [place] [address]    Gram Stain   Final    FEW SQUAMOUS EPITHELIAL CELLS PRESENT FEW WBC SEEN FEW GRAM POSITIVE COCCI    Culture   Final    RARE Normal respiratory flora-no Staph aureus or Pseudomonas seen Performed at [facility], [address], [place], [place] [address]    Report Status 12/06/2020 FINAL  Final         Radiology Studies: No results found.      Scheduled  Meds:  amiodarone  200 mg Oral Daily   apixaban  5 mg Oral BID   hydrochlorothiazide  25 mg Oral Daily   insulin aspart  0-15 Units Subcutaneous TID WC   insulin aspart  0-5 Units Subcutaneous QHS   ipratropium-albuterol  3 mL Nebulization TID   levothyroxine  150 mcg Oral Daily   metoprolol succinate  50 mg Oral Daily   PARoxetine  25 mg Oral Daily   predniSONE  40 mg Oral Q breakfast   Continuous Infusions:  azithromycin (ZITHROMAX) 500 MG IVPB (Vial-Mate [employer]) 500 mg (12/06/20 1134)   cefTRIAXone (ROCEPHIN)  IV 1 g (12/06/20 0806)     LOS: 2 days    Time spent: 40 min    Val Riles, MD [facility]   If 7PM-7AM, please contact night-coverage [email or web address] Password [email or web address] 12/06/2020, 2:03 PM

## 2020-12-07 DIAGNOSIS — J189 Pneumonia, unspecified organism: Secondary | ICD-10-CM | POA: Diagnosis not present

## 2020-12-07 LAB — CBC
HCT: 36 % (ref 36.0–46.0)
Hemoglobin: 12.1 g/dL (ref 12.0–15.0)
MCH: 30.3 pg (ref 26.0–34.0)
MCHC: 33.6 g/dL (ref 30.0–36.0)
MCV: 90.2 fL (ref 80.0–100.0)
Platelets: 240 10*3/uL (ref 150–400)
RBC: 3.99 MIL/uL (ref 3.87–5.11)
RDW: 14.9 % (ref 11.5–15.5)
WBC: 8.5 10*3/uL (ref 4.0–10.5)
nRBC: 0 % (ref 0.0–0.2)

## 2020-12-07 LAB — BASIC METABOLIC PANEL
Anion gap: 6 (ref 5–15)
BUN: 18 mg/dL (ref 8–23)
CO2: 33 mmol/L — ABNORMAL HIGH (ref 22–32)
Calcium: 9.5 mg/dL (ref 8.9–10.3)
Chloride: 100 mmol/L (ref 98–111)
Creatinine, Ser: 0.76 mg/dL (ref 0.44–1.00)
GFR, Estimated: >60 mL/min (ref >60)
Glucose, Bld: 130 mg/dL — ABNORMAL HIGH (ref 70–99)
Potassium: 3.2 mmol/L — ABNORMAL LOW (ref 3.5–5.1)
Sodium: 139 mmol/L (ref 135–145)

## 2020-12-07 LAB — GLUCOSE, CAPILLARY
Glucose-Capillary: 115 mg/dL — ABNORMAL HIGH (ref 70–99)
Glucose-Capillary: 217 mg/dL — ABNORMAL HIGH (ref 70–99)
Glucose-Capillary: 236 mg/dL — ABNORMAL HIGH (ref 70–99)
Glucose-Capillary: 218 mg/dL — ABNORMAL HIGH (ref 70–99)

## 2020-12-07 LAB — PHOSPHORUS: Phosphorus: 3.1 mg/dL (ref 2.5–4.6)

## 2020-12-07 LAB — MAGNESIUM: Magnesium: 1.9 mg/dL (ref 1.7–2.4)

## 2020-12-07 MED ORDER — HYDROCOD POLST-CPM POLST ER 10-8 MG/5ML PO SUER
5.0000 mL | Freq: Two times a day (BID) | ORAL | Status: DC | PRN
  Administered 2020-12-07 – 2020-12-10 (×6): 5 mL via ORAL
  Filled 2020-12-07 (×6): qty 5

## 2020-12-07 MED ORDER — POTASSIUM CHLORIDE CRYS ER 20 MEQ PO TBCR
40.0000 meq | EXTENDED_RELEASE_TABLET | ORAL | Status: AC
  Administered 2020-12-07 (×2): 40 meq via ORAL
  Filled 2020-12-07 (×2): qty 2

## 2020-12-07 MED ORDER — DOXYCYCLINE HYCLATE 100 MG PO TABS
100.0000 mg | ORAL_TABLET | Freq: Two times a day (BID) | ORAL | Status: DC
  Administered 2020-12-08: 100 mg via ORAL
  Filled 2020-12-07: qty 1

## 2020-12-07 NOTE — Progress Notes (Signed)
DC tele monitoring per Dr Dwyane [name]

## 2020-12-07 NOTE — Progress Notes (Signed)
PROGRESS NOTE    Kirsten [name]  IRJ:[id number] DOB: Oct 05, 1955 DOA: 12/04/2020 PCP: [name], MD  Outpatient Specialists: [facility]    Brief Narrative:   Kirsten [name] is a 65 y.o. female with medical history significant of hypertension, hyperlipidemia, diabetes mellitus, asthma, GERD, OSA on CPAP, hypothyroidism, pulmonary fibrosis, IBS, atrial fibrillation on Eliquis, s/p of gastroschisis repair, who presents with shortness of breath.   Patient states that she has shortness of breath for more than 5 days, which has been progressively worsening.  Patient has cough with yellow sputum production.  Patient also reports low grade subjective fever and chills.  No chest pain.  Denies nausea, vomiting, diarrhea or abdominal pain.  No symptoms of UTI.  Patient stated he has been consistently taking Eliquis, last dose was yesterday.  Patient is not wearing oxygen normally, but was found to have oxygen desaturating to 86% on room air.  Currently 92% on 4 L oxygen.   Assessment & Plan:   Principal Problem:   CAP (community acquired pneumonia) Active Problems:   Hypothyroidism   Depression with anxiety   OSA on CPAP   Paroxysmal atrial fibrillation (HCC)   Pulmonary fibrosis (HCC)   Diabetes mellitus without complication (HCC)   Asthma exacerbation   Acute respiratory failure with hypoxia (HCC)   Hyperlipidemia   Hypokalemia   Acute respiratory failure with hypoxia due to CAP and asthma exacerbation in the setting of Pulmonary fibrosis:  Pt reportedly febrile at home,with several days dyspnea and productive cough. Cxr with possible infiltrate. Followed by pulm as outpt Continue supplemental O2 admission and gradually wean off - monitor O2, resume o2 as needed.  - s/p azithromycin, transition to oral doxycycline due to prolonged QTC  Continue rocephin for 7 days, transition to orals at time of d/c - cont steroids, de-escalate to prednisone 40 mg p.o. daily on  10/24 - urine antigens for strep and Legionella negative, sputum and blood cultures negative - close outpt pulm f/u   Hypothyroidism -Synthroid   Depression with anxiety -Continue home medications   OSA  -on CPAP   Paroxysmal atrial fibrillation (HCC) -Continue amiodarone, metoprolol -Continue Eliquis   Diabetes mellitus without complication ([place]): Recent A1c 6.7, well controlled.  Patient taking metformin. Here glucose mildly elevated in setting of steroids -Sliding scale insulin   Hyperlipidemia: Patient is not taking Lipitor currently -Follow-up with PCP   Hypokalemia: Potassium 3.3 on admit -Repleted potassium -Monitor and replete as needed   DVT prophylaxis: lovenox Code Status: full Family Communication: husband updated @ bedside 10/24  Level of care: med/surg Status is: Inpatient  Remains inpt appropriate: severity of illness        Consultants:  none  Procedures: none  Antimicrobials:  Azithromycin DC'd on 10/26 ceftriaxone  10/26 doxycycline  Subjective: No significant overnight events, patient was feeling little bit more shortness of breath today, still has productive cough.  Patient denied any chest pain or palpitations    Objective: Vitals:   12/07/20 0703 12/07/20 0734 12/07/20 1133 12/07/20 1319  BP:  (!) 111/54 (!) 123/56   Pulse:  73 69   Resp:  16 16   Temp:  98.5 F (36.9 C) 98.7 F (37.1 C)   TempSrc:  Oral Oral   SpO2: 92% 95% 98% 93%  Weight:      Height:        Intake/Output Summary (Last 24 hours) at 12/07/2020 1430 Last data filed at 12/07/2020 1412 Gross per 24 hour  Intake  1530 ml  Output --  Net 1530 ml   Filed Weights   12/04/20 0648  Weight: 90.7 kg    Examination:  General exam: Appears calm and comfortable  Respiratory system:  wheezing bilaterally Cardiovascular system: S1 & S2 heard, RRR. No JVD, murmurs, rubs, gallops or clicks. No pedal edema. Gastrointestinal system: Abdomen is nondistended,  soft and nontender. No organomegaly or masses felt. Normal bowel sounds heard. Central nervous system: Alert and oriented. No focal neurological deficits. Extremities: Symmetric 5 x 5 power. Skin: No rashes, lesions or ulcers Psychiatry: Judgement and insight appear normal. Mood & affect appropriate.     Data Reviewed: I have personally reviewed following labs and imaging studies  CBC: Recent Labs  Lab 12/04/20 0718 12/07/20 0419  WBC 10.2 8.5  HGB 12.9 12.1  HCT 38.8 36.0  MCV 92.2 90.2  PLT 240 546   Basic Metabolic Panel: Recent Labs  Lab 12/04/20 0718 12/04/20 0901 12/05/20 0625 12/06/20 0536 12/07/20 0419  NA 140  --  138 137 139  K 3.3*  --  3.7 4.1 3.2*  CL 98  --  97* 98 100  CO2 31  --  32 32 33*  GLUCOSE 155*  --  191* 234* 130*  BUN 12  --  16 21 18   CREATININE 0.70  --  0.60 0.66 0.76  CALCIUM 9.6  --  10.0 9.8 9.5  MG  --  1.7 1.9 2.0 1.9  PHOS  --   --   --  3.2 3.1   GFR: Estimated Creatinine Clearance: 83.2 mL/min (by C-G formula based on SCr of 0.76 mg/dL). Liver Function Tests: No results for input(s): AST, ALT, ALKPHOS, BILITOT, PROT, ALBUMIN in the last 168 hours. No results for input(s): LIPASE, AMYLASE in the last 168 hours. No results for input(s): AMMONIA in the last 168 hours. Coagulation Profile: No results for input(s): INR, PROTIME in the last 168 hours. Cardiac Enzymes: No results for input(s): CKTOTAL, CKMB, CKMBINDEX, TROPONINI in the last 168 hours. BNP (last 3 results) No results for input(s): PROBNP in the last 8760 hours. HbA1C: Recent Labs    12/06/20 0917  HGBA1C 6.3*   CBG: Recent Labs  Lab 12/06/20 1135 12/06/20 1646 12/06/20 2035 12/07/20 0734 12/07/20 1134  GLUCAP 153* 173* 251* 115* 217*   Lipid Profile: No results for input(s): CHOL, HDL, LDLCALC, TRIG, CHOLHDL, LDLDIRECT in the last 72 hours. Thyroid Function Tests: No results for input(s): TSH, T4TOTAL, FREET4, T3FREE, THYROIDAB in the last 72  hours. Anemia Panel: No results for input(s): VITAMINB12, FOLATE, FERRITIN, TIBC, IRON, RETICCTPCT in the last 72 hours. Urine analysis:    Component Value Date/Time   COLORURINE YELLOW 07/19/2020 1411   APPEARANCEUR Sl Cloudy (A) 07/19/2020 1411   APPEARANCEUR Cloudy (A) 01/01/2017 1403   LABSPEC 1.020 07/19/2020 1411   PHURINE 6.0 07/19/2020 1411   GLUCOSEU NEGATIVE 07/19/2020 1411   HGBUR NEGATIVE 07/19/2020 1411   HGBUR trace-lysed 12/20/2009 1107   BILIRUBINUR NEGATIVE 07/19/2020 1411   BILIRUBINUR neg 06/06/2020 1553   BILIRUBINUR Negative 01/01/2017 1403   KETONESUR TRACE (A) 07/19/2020 1411   PROTEINUR Negative 09/07/2019 1442   PROTEINUR 1+ (A) 01/01/2017 1403   PROTEINUR NEGATIVE 07/12/2010 0738   UROBILINOGEN 0.2 07/19/2020 1411   NITRITE POSITIVE (A) 07/19/2020 1411   LEUKOCYTESUR LARGE (A) 07/19/2020 1411   Sepsis Labs: @LABRCNTIP (procalcitonin:4,lacticidven:4)  ) Recent Results (from the past 240 hour(s))  Resp Panel by RT-PCR (Flu A&B, Covid) Nasopharyngeal Swab  Status: None   Collection Time: 12/04/20  7:24 AM   Specimen: Nasopharyngeal Swab; Nasopharyngeal(NP) swabs in vial transport medium  Result Value Ref Range Status   SARS Coronavirus 2 by RT PCR NEGATIVE NEGATIVE Final    Comment: (NOTE) SARS-CoV-2 target nucleic acids are NOT DETECTED.  The SARS-CoV-2 RNA is generally detectable in upper respiratory specimens during the acute phase of infection. The lowest concentration of SARS-CoV-2 viral copies this assay can detect is 138 copies/mL. A negative result does not preclude SARS-Cov-2 infection and should not be used as the sole basis for treatment or other patient management decisions. A negative result may occur with  improper specimen collection/handling, submission of specimen other than nasopharyngeal swab, presence of viral mutation(s) within the areas targeted by this assay, and inadequate number of viral copies(<138 copies/mL). A  negative result must be combined with clinical observations, patient history, and epidemiological information. The expected result is Negative.  Fact Sheet for Patients:  EntrepreneurPulse.com.au  Fact Sheet for Healthcare Providers:  IncredibleEmployment.be  This test is no t yet approved or cleared by the [place] FDA and  has been authorized for detection and/or diagnosis of SARS-CoV-2 by FDA under an Emergency Use Authorization (EUA). This EUA will remain  in effect (meaning this test can be used) for the duration of the COVID-19 declaration under Section 564(b)(1) of the Act, 21 U.S.C.section 360bbb-3(b)(1), unless the authorization is terminated  or revoked sooner.       Influenza A by PCR NEGATIVE NEGATIVE Final   Influenza B by PCR NEGATIVE NEGATIVE Final    Comment: (NOTE) The Xpert Xpress SARS-CoV-2/FLU/RSV plus assay is intended as an aid in the diagnosis of influenza from Nasopharyngeal swab specimens and should not be used as a sole basis for treatment. Nasal washings and aspirates are unacceptable for Xpert Xpress SARS-CoV-2/FLU/RSV testing.  Fact Sheet for Patients: EntrepreneurPulse.com.au  Fact Sheet for Healthcare Providers: IncredibleEmployment.be  This test is not yet approved or cleared by the [place] FDA and has been authorized for detection and/or diagnosis of SARS-CoV-2 by FDA under an Emergency Use Authorization (EUA). This EUA will remain in effect (meaning this test can be used) for the duration of the COVID-19 declaration under Section 564(b)(1) of the Act, 21 U.S.C. section 360bbb-3(b)(1), unless the authorization is terminated or revoked.  Performed at [facility], [address] [place], [place] [address]   Blood culture (routine x 2)     Status: None (Preliminary result)   Collection Time: 12/04/20  9:01 AM   Specimen: BLOOD LEFT HAND  Result  Value Ref Range Status   Specimen Description BLOOD LEFT HAND  Final   Special Requests   Final    BOTTLES DRAWN AEROBIC AND ANAEROBIC Blood Culture results may not be optimal due to an inadequate volume of blood received in culture bottles   Culture   Final    NO GROWTH 3 DAYS Performed at [facility], [address], [place], [place] [address]    Report Status PENDING  Incomplete  Blood culture (routine x 2)     Status: None (Preliminary result)   Collection Time: 12/04/20  9:01 AM   Specimen: BLOOD RIGHT HAND  Result Value Ref Range Status   Specimen Description BLOOD RIGHT HAND  Final   Special Requests   Final    BOTTLES DRAWN AEROBIC AND ANAEROBIC Blood Culture adequate volume   Culture   Final    NO GROWTH 3 DAYS Performed at [facility], [place], [place] [address]    Report Status PENDING  Incomplete  Expectorated Sputum Assessment w Gram Stain, Rflx to Resp Cult     Status: None   Collection Time: 12/04/20  1:41 PM   Specimen: Sputum  Result Value Ref Range Status   Specimen Description SPUTUM  Final   Special Requests NONE  Final   Sputum evaluation   Final    THIS SPECIMEN IS ACCEPTABLE FOR SPUTUM CULTURE Performed at [facility], [address], [place], [place] [address]    Report Status 12/04/2020 FINAL  Final  Culture, Respiratory w Gram Stain     Status: None   Collection Time: 12/04/20  1:41 PM   Specimen: SPU  Result Value Ref Range Status   Specimen Description   Final    SPUTUM Performed at [facility], [address] [place], [place] [address]    Special Requests   Final    NONE Reflexed from [phone] [id number] Performed at [facility], [address] [place], [place] [address]    Gram Stain   Final    FEW SQUAMOUS EPITHELIAL CELLS PRESENT FEW WBC SEEN FEW GRAM POSITIVE COCCI    Culture   Final    RARE Normal respiratory flora-no Staph aureus or Pseudomonas seen Performed at [facility], [place] [address] [place], [place] [address]    Report Status 12/06/2020 FINAL  Final         Radiology Studies: No results found.      Scheduled Meds:  amiodarone  200 mg Oral Daily   apixaban  5 mg Oral BID   [START ON 12/08/2020] doxycycline  100 mg Oral Q12H   guaiFENesin  600 mg Oral BID   hydrochlorothiazide  25 mg Oral Daily   insulin aspart  0-15 Units Subcutaneous TID WC   insulin aspart  0-5 Units Subcutaneous QHS   ipratropium-albuterol  3 mL Nebulization TID   levothyroxine  150 mcg Oral Daily   metoprolol succinate  50 mg Oral Daily   PARoxetine  25 mg Oral Daily   predniSONE  40 mg Oral Q breakfast   Continuous Infusions:  cefTRIAXone (ROCEPHIN)  IV Stopped (12/07/20 0815)     LOS: 3 days    Time spent: 40 min    Val Riles, MD [facility]   If 7PM-7AM, please contact night-coverage [email or web address] Password [email or web address] 12/07/2020, 2:30 PM

## 2020-12-07 NOTE — Care Management Important Message (Signed)
Important Message  Patient Details  [name] MRN: [id number] Date of Birth: Aug 21, 1955   Medicare Important Message Given:  Yes     [name] A Evianna Chandran 12/07/2020, 12:34 PM

## 2020-12-08 DIAGNOSIS — J189 Pneumonia, unspecified organism: Secondary | ICD-10-CM | POA: Diagnosis not present

## 2020-12-08 LAB — CBC
HCT: 36.6 % (ref 36.0–46.0)
Hemoglobin: 12.3 g/dL (ref 12.0–15.0)
MCH: 30.4 pg (ref 26.0–34.0)
MCHC: 33.6 g/dL (ref 30.0–36.0)
MCV: 90.4 fL (ref 80.0–100.0)
Platelets: 237 10*3/uL (ref 150–400)
RBC: 4.05 MIL/uL (ref 3.87–5.11)
RDW: 14.9 % (ref 11.5–15.5)
WBC: 8.1 10*3/uL (ref 4.0–10.5)
nRBC: 0 % (ref 0.0–0.2)

## 2020-12-08 LAB — BASIC METABOLIC PANEL
Anion gap: 8 (ref 5–15)
BUN: 16 mg/dL (ref 8–23)
CO2: 32 mmol/L (ref 22–32)
Calcium: 9.4 mg/dL (ref 8.9–10.3)
Chloride: 98 mmol/L (ref 98–111)
Creatinine, Ser: 0.77 mg/dL (ref 0.44–1.00)
GFR, Estimated: >60 mL/min (ref >60)
Glucose, Bld: 111 mg/dL — ABNORMAL HIGH (ref 70–99)
Potassium: 3.7 mmol/L (ref 3.5–5.1)
Sodium: 138 mmol/L (ref 135–145)

## 2020-12-08 LAB — GLUCOSE, CAPILLARY
Glucose-Capillary: 120 mg/dL — ABNORMAL HIGH (ref 70–99)
Glucose-Capillary: 202 mg/dL — ABNORMAL HIGH (ref 70–99)
Glucose-Capillary: 354 mg/dL — ABNORMAL HIGH (ref 70–99)
Glucose-Capillary: 154 mg/dL — ABNORMAL HIGH (ref 70–99)

## 2020-12-08 LAB — PHOSPHORUS: Phosphorus: 2.9 mg/dL (ref 2.5–4.6)

## 2020-12-08 LAB — MAGNESIUM: Magnesium: 1.8 mg/dL (ref 1.7–2.4)

## 2020-12-08 MED ORDER — DIPHENHYDRAMINE HCL 50 MG/ML IJ SOLN
25.0000 mg | Freq: Once | INTRAMUSCULAR | Status: AC
  Administered 2020-12-08: 25 mg via INTRAVENOUS
  Filled 2020-12-08: qty 1

## 2020-12-08 MED ORDER — FAMOTIDINE 20 MG PO TABS
20.0000 mg | ORAL_TABLET | Freq: Once | ORAL | Status: AC
  Administered 2020-12-08: 21:00:00 20 mg via ORAL
  Filled 2020-12-08: qty 1

## 2020-12-08 MED ORDER — DIPHENHYDRAMINE HCL 50 MG/ML IJ SOLN
50.0000 mg | Freq: Four times a day (QID) | INTRAMUSCULAR | Status: DC | PRN
  Administered 2020-12-09 – 2020-12-11 (×9): 50 mg via INTRAVENOUS
  Filled 2020-12-08 (×10): qty 1

## 2020-12-08 MED ORDER — ALUM & MAG HYDROXIDE-SIMETH 200-200-20 MG/5ML PO SUSP
30.0000 mL | ORAL | Status: DC | PRN
  Administered 2020-12-08: 20:00:00 30 mL via ORAL
  Filled 2020-12-08: qty 30

## 2020-12-08 MED ORDER — HYDROCORTISONE 1 % EX CREA
TOPICAL_CREAM | Freq: Three times a day (TID) | CUTANEOUS | Status: DC | PRN
  Filled 2020-12-08: qty 28

## 2020-12-08 NOTE — Progress Notes (Signed)
PROGRESS NOTE    Kirsten [name]  MCN:[id number] DOB: [birth year] DOA: 12/04/2020 PCP: [name], MD  Outpatient Specialists: [facility]    Brief Narrative:   Kirsten [name] is a 65 y.o. female with medical history significant of hypertension, hyperlipidemia, diabetes mellitus, asthma, GERD, OSA on CPAP, hypothyroidism, pulmonary fibrosis, IBS, atrial fibrillation on Eliquis, s/p of gastroschisis repair, who presents with shortness of breath.   Patient states that she has shortness of breath for more than 5 days, which has been progressively worsening.  Patient has cough with yellow sputum production.  Patient also reports low grade subjective fever and chills.  No chest pain.  Denies nausea, vomiting, diarrhea or abdominal pain.  No symptoms of UTI.  Patient stated he has been consistently taking Eliquis, last dose was yesterday.  Patient is not wearing oxygen normally, but was found to have oxygen desaturating to 86% on room air.  Currently 92% on 4 L oxygen.   Assessment & Plan:   Principal Problem:   CAP (community acquired pneumonia) Active Problems:   Hypothyroidism   Depression with anxiety   OSA on CPAP   Paroxysmal atrial fibrillation (HCC)   Pulmonary fibrosis (HCC)   Diabetes mellitus without complication (HCC)   Asthma exacerbation   Acute respiratory failure with hypoxia (HCC)   Hyperlipidemia   Hypokalemia   Acute respiratory failure with hypoxia due to CAP and asthma exacerbation in the setting of Pulmonary fibrosis:  Pt reportedly febrile at home,with several days dyspnea and productive cough. Cxr with possible infiltrate. Followed by pulm as outpt Continue supplemental O2 admission and gradually wean off - monitor O2, resume o2 as needed.  - s/p azithromycin, transition to oral doxycycline due to prolonged QTC  Continue rocephin for 7 days, transition to orals at time of d/c - cont steroids, de-escalate to prednisone 40 mg p.o. daily on  10/24 - urine antigens for strep and Legionella negative, sputum and blood cultures negative - close outpt pulm f/u   Hypothyroidism -Synthroid   Depression with anxiety -Continue home medications   OSA  -on CPAP   Paroxysmal atrial fibrillation (HCC) -Continue amiodarone, metoprolol -Continue Eliquis   Diabetes mellitus without complication ([facility]): Recent A1c 6.7, well controlled.  Patient taking metformin. Here glucose mildly elevated in setting of steroids -Sliding scale insulin   Hyperlipidemia: Patient is not taking Lipitor currently -Follow-up with PCP   Hypokalemia: Potassium 3.3 on admit -Repleted potassium -Monitor and replete as needed   DVT prophylaxis: lovenox Code Status: full Family Communication: husband updated @ bedside 10/24  Level of care: med/surg Status is: Inpatient  Remains inpt appropriate: severity of illness        Consultants:  none  Procedures: none  Antimicrobials:  Azithromycin DC'd on 10/26 ceftriaxone  10/26 doxycycline  Subjective: No significant overnight events, patient still having dyspnea on exertion and oxygen dropped while ambulation.  Patient still has mild cough and wheezing.  Overall she feels improvement, may require 1 more day to stay to check ambulatory O2 sats. Patient denies any chest pain or palpitations, no any other active issues.   Objective: Vitals:   12/08/20 0327 12/08/20 0728 12/08/20 0748 12/08/20 1123  BP: (!) 124/57  129/73 (!) 159/78  Pulse: 71  82 81  Resp: 16  20 20   Temp: 98.1 F (36.7 C)  98.4 F (36.9 C) 99.5 F (37.5 C)  TempSrc: Oral  Oral   SpO2: 92% 90% 94% 91%  Weight:  Height:        Intake/Output Summary (Last 24 hours) at 12/08/2020 1300 Last data filed at 12/08/2020 1025 Gross per 24 hour  Intake 1056.84 ml  Output --  Net 1056.84 ml   Filed Weights   12/04/20 0648  Weight: 90.7 kg    Examination:  General exam: Appears calm and comfortable  Respiratory  system:  wheezing bilaterally Cardiovascular system: S1 & S2 heard, RRR. No JVD, murmurs, rubs, gallops or clicks. No pedal edema. Gastrointestinal system: Abdomen is nondistended, soft and nontender. No organomegaly or masses felt. Normal bowel sounds heard. Central nervous system: Alert and oriented. No focal neurological deficits. Extremities: Symmetric 5 x 5 power. Skin: No rashes, lesions or ulcers Psychiatry: Judgement and insight appear normal. Mood & affect appropriate.     Data Reviewed: I have personally reviewed following labs and imaging studies  CBC: Recent Labs  Lab 12/04/20 0718 12/07/20 0419 12/08/20 0549  WBC 10.2 8.5 8.1  HGB 12.9 12.1 12.3  HCT 38.8 36.0 36.6  MCV 92.2 90.2 90.4  PLT 240 240 654   Basic Metabolic Panel: Recent Labs  Lab 12/04/20 0718 12/04/20 0901 12/05/20 0625 12/06/20 0536 12/07/20 0419 12/08/20 0549  NA 140  --  138 137 139 138  K 3.3*  --  3.7 4.1 3.2* 3.7  CL 98  --  97* 98 100 98  CO2 31  --  32 32 33* 32  GLUCOSE 155*  --  191* 234* 130* 111*  BUN 12  --  16 21 18 16   CREATININE 0.70  --  0.60 0.66 0.76 0.77  CALCIUM 9.6  --  10.0 9.8 9.5 9.4  MG  --  1.7 1.9 2.0 1.9 1.8  PHOS  --   --   --  3.2 3.1 2.9   GFR: Estimated Creatinine Clearance: 83.2 mL/min (by C-G formula based on SCr of 0.77 mg/dL). Liver Function Tests: No results for input(s): AST, ALT, ALKPHOS, BILITOT, PROT, ALBUMIN in the last 168 hours. No results for input(s): LIPASE, AMYLASE in the last 168 hours. No results for input(s): AMMONIA in the last 168 hours. Coagulation Profile: No results for input(s): INR, PROTIME in the last 168 hours. Cardiac Enzymes: No results for input(s): CKTOTAL, CKMB, CKMBINDEX, TROPONINI in the last 168 hours. BNP (last 3 results) No results for input(s): PROBNP in the last 8760 hours. HbA1C: Recent Labs    12/06/20 0917  HGBA1C 6.3*   CBG: Recent Labs  Lab 12/07/20 1134 12/07/20 1623 12/07/20 2123 12/08/20 0749  12/08/20 1158  GLUCAP 217* 236* 218* 120* 202*   Lipid Profile: No results for input(s): CHOL, HDL, LDLCALC, TRIG, CHOLHDL, LDLDIRECT in the last 72 hours. Thyroid Function Tests: No results for input(s): TSH, T4TOTAL, FREET4, T3FREE, THYROIDAB in the last 72 hours. Anemia Panel: No results for input(s): VITAMINB12, FOLATE, FERRITIN, TIBC, IRON, RETICCTPCT in the last 72 hours. Urine analysis:    Component Value Date/Time   COLORURINE YELLOW 07/19/2020 1411   APPEARANCEUR Sl Cloudy (A) 07/19/2020 1411   APPEARANCEUR Cloudy (A) 01/01/2017 1403   LABSPEC 1.020 07/19/2020 1411   PHURINE 6.0 07/19/2020 1411   GLUCOSEU NEGATIVE 07/19/2020 1411   HGBUR NEGATIVE 07/19/2020 1411   HGBUR trace-lysed 12/20/2009 1107   BILIRUBINUR NEGATIVE 07/19/2020 1411   BILIRUBINUR neg 06/06/2020 1553   BILIRUBINUR Negative 01/01/2017 1403   KETONESUR TRACE (A) 07/19/2020 1411   PROTEINUR Negative 09/07/2019 1442   PROTEINUR 1+ (A) 01/01/2017 [place] 07/12/2010 6503  UROBILINOGEN 0.2 07/19/2020 1411   NITRITE POSITIVE (A) 07/19/2020 1411   LEUKOCYTESUR LARGE (A) 07/19/2020 1411   Sepsis Labs: @LABRCNTIP (procalcitonin:4,lacticidven:4)  ) Recent Results (from the past 240 hour(s))  Resp Panel by RT-PCR (Flu A&B, Covid) Nasopharyngeal Swab     Status: None   Collection Time: 12/04/20  7:24 AM   Specimen: Nasopharyngeal Swab; Nasopharyngeal(NP) swabs in vial transport medium  Result Value Ref Range Status   SARS Coronavirus 2 by RT PCR NEGATIVE NEGATIVE Final    Comment: (NOTE) SARS-CoV-2 target nucleic acids are NOT DETECTED.  The SARS-CoV-2 RNA is generally detectable in upper respiratory specimens during the acute phase of infection. The lowest concentration of SARS-CoV-2 viral copies this assay can detect is 138 copies/mL. A negative result does not preclude SARS-Cov-2 infection and should not be used as the sole basis for treatment or other patient management decisions.  A negative result may occur with  improper specimen collection/handling, submission of specimen other than nasopharyngeal swab, presence of viral mutation(s) within the areas targeted by this assay, and inadequate number of viral copies(<138 copies/mL). A negative result must be combined with clinical observations, patient history, and epidemiological information. The expected result is Negative.  Fact Sheet for Patients:  EntrepreneurPulse.com.au  Fact Sheet for Healthcare Providers:  IncredibleEmployment.be  This test is no t yet approved or cleared by the [place] FDA and  has been authorized for detection and/or diagnosis of SARS-CoV-2 by FDA under an Emergency Use Authorization (EUA). This EUA will remain  in effect (meaning this test can be used) for the duration of the COVID-19 declaration under Section 564(b)(1) of the Act, 21 U.S.C.section 360bbb-3(b)(1), unless the authorization is terminated  or revoked sooner.       Influenza A by PCR NEGATIVE NEGATIVE Final   Influenza B by PCR NEGATIVE NEGATIVE Final    Comment: (NOTE) The Xpert Xpress SARS-CoV-2/FLU/RSV plus assay is intended as an aid in the diagnosis of influenza from Nasopharyngeal swab specimens and should not be used as a sole basis for treatment. Nasal washings and aspirates are unacceptable for Xpert Xpress SARS-CoV-2/FLU/RSV testing.  Fact Sheet for Patients: EntrepreneurPulse.com.au  Fact Sheet for Healthcare Providers: IncredibleEmployment.be  This test is not yet approved or cleared by the [place] FDA and has been authorized for detection and/or diagnosis of SARS-CoV-2 by FDA under an Emergency Use Authorization (EUA). This EUA will remain in effect (meaning this test can be used) for the duration of the COVID-19 declaration under Section 564(b)(1) of the Act, 21 U.S.C. section 360bbb-3(b)(1), unless the authorization  is terminated or revoked.  Performed at [facility], [address] [place], [place] [address]   Blood culture (routine x 2)     Status: None (Preliminary result)   Collection Time: 12/04/20  9:01 AM   Specimen: BLOOD LEFT HAND  Result Value Ref Range Status   Specimen Description BLOOD LEFT HAND  Final   Special Requests   Final    BOTTLES DRAWN AEROBIC AND ANAEROBIC Blood Culture results may not be optimal due to an inadequate volume of blood received in culture bottles   Culture   Final    NO GROWTH 4 DAYS Performed at [facility], [address] [place], [place] [address]    Report Status PENDING  Incomplete  Blood culture (routine x 2)     Status: None (Preliminary result)   Collection Time: 12/04/20  9:01 AM   Specimen: BLOOD RIGHT HAND  Result Value Ref Range  Status   Specimen Description BLOOD RIGHT HAND  Final   Special Requests   Final    BOTTLES DRAWN AEROBIC AND ANAEROBIC Blood Culture adequate volume   Culture   Final    NO GROWTH 4 DAYS Performed at [facility], [address] [place], [place] [address]    Report Status PENDING  Incomplete  Expectorated Sputum Assessment w Gram Stain, Rflx to Resp Cult     Status: None   Collection Time: 12/04/20  1:41 PM   Specimen: Sputum  Result Value Ref Range Status   Specimen Description SPUTUM  Final   Special Requests NONE  Final   Sputum evaluation   Final    THIS SPECIMEN IS ACCEPTABLE FOR SPUTUM CULTURE Performed at [facility], [address], [place], [place] [address]    Report Status 12/04/2020 FINAL  Final  Culture, Respiratory w Gram Stain     Status: None   Collection Time: 12/04/20  1:41 PM   Specimen: SPU  Result Value Ref Range Status   Specimen Description   Final    SPUTUM Performed at [facility], [address] [place], [place] [address]    Special Requests   Final    NONE Reflexed from [id number] Performed at [facility], [address] [place], [place] [address]    Gram Stain   Final    FEW SQUAMOUS EPITHELIAL CELLS PRESENT FEW WBC SEEN FEW GRAM POSITIVE COCCI    Culture   Final    RARE Normal respiratory flora-no Staph aureus or Pseudomonas seen Performed at [facility], [place] [address] [place], [place] [address]    Report Status 12/06/2020 FINAL  Final         Radiology Studies: No results found.      Scheduled Meds:  amiodarone  200 mg Oral Daily   apixaban  5 mg Oral BID   doxycycline  100 mg Oral Q12H   guaiFENesin  600 mg Oral BID   hydrochlorothiazide  25 mg Oral Daily   insulin aspart  0-15 Units Subcutaneous TID WC   insulin aspart  0-5 Units Subcutaneous QHS   ipratropium-albuterol  3 mL Nebulization TID   levothyroxine  150 mcg Oral Daily   metoprolol succinate  50 mg Oral Daily   PARoxetine  25 mg Oral Daily   predniSONE  40 mg Oral Q breakfast   Continuous Infusions:  cefTRIAXone (ROCEPHIN)  IV Stopped (12/08/20 0848)     LOS: 4 days    Time spent: 40 min    Val Riles, MD [facility]   If 7PM-7AM, please contact night-coverage [email or web address] Password [email or web address] 12/08/2020, 1:00 PM

## 2020-12-08 NOTE — Progress Notes (Addendum)
Cross Cover Patient developed severe itching and rash bilateral lower extremities and redness in her face.  This is accompanied by itching in her ears. She denies feeling like she has swelling in her face. . She felt it might be due to the oral antibiotic that she took this morning as she has had this reaction before to antibiotics. However, she does state she has been on doxycycline before and did not have a reaction. She was medicated with benadryl.  She is already on prednisone. Will give her a dose of pepcid.  Medication discontinued for now.  Will check with pharmacy for perhaps an interaction with her other medications  Update - discussion with pharmacy - no interactions of other meds likely.  Noted HCTZ on MAR - will need to monitor for angioedema,

## 2020-12-09 ENCOUNTER — Inpatient Hospital Stay: Payer: PPO

## 2020-12-09 DIAGNOSIS — J189 Pneumonia, unspecified organism: Secondary | ICD-10-CM | POA: Diagnosis not present

## 2020-12-09 LAB — CULTURE, BLOOD (ROUTINE X 2)
Culture: NO GROWTH
Culture: NO GROWTH
Special Requests: ADEQUATE

## 2020-12-09 LAB — CBC
HCT: 42.7 % (ref 36.0–46.0)
Hemoglobin: 13.9 g/dL (ref 12.0–15.0)
MCH: 29.3 pg (ref 26.0–34.0)
MCHC: 32.6 g/dL (ref 30.0–36.0)
MCV: 89.9 fL (ref 80.0–100.0)
Platelets: 362 10*3/uL (ref 150–400)
RBC: 4.75 MIL/uL (ref 3.87–5.11)
RDW: 14.8 % (ref 11.5–15.5)
WBC: 16.3 10*3/uL — ABNORMAL HIGH (ref 4.0–10.5)
nRBC: 0 % (ref 0.0–0.2)

## 2020-12-09 LAB — BASIC METABOLIC PANEL
Anion gap: 7 (ref 5–15)
BUN: 16 mg/dL (ref 8–23)
CO2: 35 mmol/L — ABNORMAL HIGH (ref 22–32)
Calcium: 9.8 mg/dL (ref 8.9–10.3)
Chloride: 95 mmol/L — ABNORMAL LOW (ref 98–111)
Creatinine, Ser: 0.75 mg/dL (ref 0.44–1.00)
GFR, Estimated: >60 mL/min (ref >60)
Glucose, Bld: 112 mg/dL — ABNORMAL HIGH (ref 70–99)
Potassium: 4.3 mmol/L (ref 3.5–5.1)
Sodium: 137 mmol/L (ref 135–145)

## 2020-12-09 LAB — GLUCOSE, CAPILLARY
Glucose-Capillary: 172 mg/dL — ABNORMAL HIGH (ref 70–99)
Glucose-Capillary: 152 mg/dL — ABNORMAL HIGH (ref 70–99)
Glucose-Capillary: 264 mg/dL — ABNORMAL HIGH (ref 70–99)
Glucose-Capillary: 159 mg/dL — ABNORMAL HIGH (ref 70–99)

## 2020-12-09 LAB — MAGNESIUM: Magnesium: 2.1 mg/dL (ref 1.7–2.4)

## 2020-12-09 LAB — PHOSPHORUS: Phosphorus: 3.2 mg/dL (ref 2.5–4.6)

## 2020-12-09 MED ORDER — CALAMINE EX LOTN
1.0000 "application " | TOPICAL_LOTION | CUTANEOUS | Status: DC | PRN
  Administered 2020-12-10 (×2): 1 via TOPICAL
  Filled 2020-12-09 (×2): qty 177

## 2020-12-09 MED ORDER — ALPRAZOLAM 0.25 MG PO TABS
0.2500 mg | ORAL_TABLET | Freq: Three times a day (TID) | ORAL | Status: DC | PRN
  Administered 2020-12-09 – 2020-12-12 (×7): 0.25 mg via ORAL
  Filled 2020-12-09 (×7): qty 1

## 2020-12-09 MED ORDER — HYDROXYZINE HCL 25 MG PO TABS
25.0000 mg | ORAL_TABLET | Freq: Three times a day (TID) | ORAL | Status: DC | PRN
  Administered 2020-12-09 – 2020-12-11 (×7): 25 mg via ORAL
  Filled 2020-12-09 (×15): qty 1

## 2020-12-09 MED ORDER — FAMOTIDINE 20 MG PO TABS
20.0000 mg | ORAL_TABLET | Freq: Two times a day (BID) | ORAL | Status: DC
  Administered 2020-12-09 – 2020-12-13 (×9): 20 mg via ORAL
  Filled 2020-12-09 (×9): qty 1

## 2020-12-09 NOTE — Care Management Important Message (Signed)
Important Message  Patient Details  [name] MRN: [id number] Date of Birth: 1955-04-02   Medicare Important Message Given:  Yes     [name] [name] 12/09/2020, 1:45 PM

## 2020-12-09 NOTE — Progress Notes (Signed)
Dr Dwyane [name] made aware that pt complaining of increased itching again and noted that pt does now appear to have splotching redness to BLE and chest unlike this morning on assessment where no rash was noted to legs, already given morning doses of PRN meds and now applied PRN hydrocortisone cream

## 2020-12-09 NOTE — Progress Notes (Signed)
PROGRESS NOTE    Kirsten [name]  BBC:[id number] DOB: 02-26-1955 DOA: 12/04/2020 PCP: [name], MD  Outpatient Specialists: [facility]    Brief Narrative:   [name] is a 65 y.o. female with medical history significant of hypertension, hyperlipidemia, diabetes mellitus, asthma, GERD, OSA on CPAP, hypothyroidism, pulmonary fibrosis, IBS, atrial fibrillation on Eliquis, s/p of gastroschisis repair, who presents with shortness of breath.   Patient states that she has shortness of breath for more than 5 days, which has been progressively worsening.  Patient has cough with yellow sputum production.  Patient also reports low grade subjective fever and chills.  No chest pain.  Denies nausea, vomiting, diarrhea or abdominal pain.  No symptoms of UTI.  Patient stated he has been consistently taking Eliquis.  Patient was having hypoxia with saturation 86% on room air and patient was on 4 L oxygen, gradually oxygen has been weaned off.  Currently she is satting above 95% on room air   Assessment & Plan:   Principal Problem:   CAP (community acquired pneumonia) Active Problems:   Hypothyroidism   Depression with anxiety   OSA on CPAP   Paroxysmal atrial fibrillation (HCC)   Pulmonary fibrosis (HCC)   Diabetes mellitus without complication (HCC)   Asthma exacerbation   Acute respiratory failure with hypoxia (HCC)   Hyperlipidemia   Hypokalemia   Acute respiratory failure with hypoxia due to CAP and asthma exacerbation in the setting of Pulmonary fibrosis:  Pt reportedly febrile at home,with several days dyspnea and productive cough. Cxr with possible infiltrate. Followed by pulm as outpt Continue supplemental O2 admission and gradually wean off - monitor O2, resume o2 as needed.  - s/p azithromycin, transition to oral doxycycline due to prolonged QTC, which was d/c'd on 10/27 due to allergic rash Continue rocephin for 7 days,  - cont steroids, de-escalate to  prednisone 40 mg p.o. daily on 10/24 - urine antigens for strep and Legionella negative, sputum and blood cultures negative - close outpt pulm f/u   Allergic rash, noticed at night on 10/27, doxycycline was discontinued. Continue Benadryl as needed, Atarax as needed, Pepcid 20 twice daily, hydrocortisone topical cream for itching Patient is already on prednisone as above   Hypothyroidism -Synthroid   Depression with anxiety -Continue home medications   OSA  -on CPAP   Paroxysmal atrial fibrillation (HCC) -Continue amiodarone, metoprolol -Continue Eliquis   Diabetes mellitus without complication ([place]): Recent A1c 6.7, well controlled.  Patient taking metformin. Here glucose mildly elevated in setting of steroids -Sliding scale insulin   Hyperlipidemia: Patient is not taking Lipitor currently -Follow-up with PCP   Hypokalemia: Potassium 3.3 on admit -Repleted potassium -Monitor and replete as needed   DVT prophylaxis: lovenox Code Status: full Family Communication: husband updated @ bedside 10/28  Level of care: med/surg Status is: Inpatient  Remains inpt appropriate: severity of illness    Consultants:  none  Procedures: none  Antimicrobials:  Azithromycin DC'd on 10/26 ceftriaxone  10/26 doxycycline DC'd on 10/27  Subjective: Overnight patient developed a rash on the arms and legs, with severe itching.  Patient breathing is getting better, intermittently short of breath on exertion and becomes hypoxic but recovers quickly.   Currently saturating well on room air at rest. Denies any chest pain or palpitations, no any other active issues.   Objective: Vitals:   12/09/20 0700 12/09/20 0825 12/09/20 0841 12/09/20 1115  BP: 115/62   (!) 95/58  Pulse: 68   74  Resp: 20   18  Temp: 99.2 F (37.3 C)  97.9 F (36.6 C) 97.7 F (36.5 C)  TempSrc: Oral  Oral Oral  SpO2: 95% 96%  95%  Weight:      Height:        Intake/Output Summary (Last 24 hours) at  12/09/2020 1421 Last data filed at 12/09/2020 1331 Gross per 24 hour  Intake 100 ml  Output --  Net 100 ml   Filed Weights   12/04/20 0648  Weight: 90.7 kg    Examination:  General exam: Appears calm and comfortable  Respiratory system: Mild wheezing bilaterally Cardiovascular system: S1 & S2 heard, RRR. No JVD, murmurs, rubs, gallops or clicks. No pedal edema. Gastrointestinal system: Abdomen is nondistended, soft and nontender. No organomegaly or masses felt. Normal bowel sounds heard. Central nervous system: Alert and oriented. No focal neurological deficits. Extremities: Symmetric 5 x 5 power. Skin: Erythematous rash bilateral upper and lower extremities  Psychiatry: Judgement and insight appear normal. Mood & affect appropriate.     Data Reviewed: I have personally reviewed following labs and imaging studies  CBC: Recent Labs  Lab 12/04/20 0718 12/07/20 0419 12/08/20 0549 12/09/20 0643  WBC 10.2 8.5 8.1 16.3*  HGB 12.9 12.1 12.3 13.9  HCT 38.8 36.0 36.6 42.7  MCV 92.2 90.2 90.4 89.9  PLT 240 240 237 222   Basic Metabolic Panel: Recent Labs  Lab 12/05/20 0625 12/06/20 0536 12/07/20 0419 12/08/20 0549 12/09/20 0643  NA 138 137 139 138 137  K 3.7 4.1 3.2* 3.7 4.3  CL 97* 98 100 98 95*  CO2 32 32 33* 32 35*  GLUCOSE 191* 234* 130* 111* 112*  BUN 16 21 18 16 16   CREATININE 0.60 0.66 0.76 0.77 0.75  CALCIUM 10.0 9.8 9.5 9.4 9.8  MG 1.9 2.0 1.9 1.8 2.1  PHOS  --  3.2 3.1 2.9 3.2   GFR: Estimated Creatinine Clearance: 83.2 mL/min (by C-G formula based on SCr of 0.75 mg/dL). Liver Function Tests: No results for input(s): AST, ALT, ALKPHOS, BILITOT, PROT, ALBUMIN in the last 168 hours. No results for input(s): LIPASE, AMYLASE in the last 168 hours. No results for input(s): AMMONIA in the last 168 hours. Coagulation Profile: No results for input(s): INR, PROTIME in the last 168 hours. Cardiac Enzymes: No results for input(s): CKTOTAL, CKMB, CKMBINDEX,  TROPONINI in the last 168 hours. BNP (last 3 results) No results for input(s): PROBNP in the last 8760 hours. HbA1C: No results for input(s): HGBA1C in the last 72 hours.  CBG: Recent Labs  Lab 12/08/20 1158 12/08/20 1620 12/08/20 2133 12/09/20 0848 12/09/20 1142  GLUCAP 202* 354* 154* 172* 152*   Lipid Profile: No results for input(s): CHOL, HDL, LDLCALC, TRIG, CHOLHDL, LDLDIRECT in the last 72 hours. Thyroid Function Tests: No results for input(s): TSH, T4TOTAL, FREET4, T3FREE, THYROIDAB in the last 72 hours. Anemia Panel: No results for input(s): VITAMINB12, FOLATE, FERRITIN, TIBC, IRON, RETICCTPCT in the last 72 hours. Urine analysis:    Component Value Date/Time   COLORURINE YELLOW 07/19/2020 1411   APPEARANCEUR Sl Cloudy (A) 07/19/2020 1411   APPEARANCEUR Cloudy (A) 01/01/2017 1403   LABSPEC 1.020 07/19/2020 1411   PHURINE 6.0 07/19/2020 1411   GLUCOSEU NEGATIVE 07/19/2020 1411   HGBUR NEGATIVE 07/19/2020 1411   HGBUR trace-lysed 12/20/2009 1107   BILIRUBINUR NEGATIVE 07/19/2020 1411   BILIRUBINUR neg 06/06/2020 1553   BILIRUBINUR Negative 01/01/2017 1403   KETONESUR TRACE (A) 07/19/2020 1411   PROTEINUR Negative 09/07/2019  [place] 1+ (A) 01/01/2017 1403   PROTEINUR NEGATIVE 07/12/2010 0738   UROBILINOGEN 0.2 07/19/2020 1411   NITRITE POSITIVE (A) 07/19/2020 1411   LEUKOCYTESUR LARGE (A) 07/19/2020 1411   Sepsis Labs: @LABRCNTIP (procalcitonin:4,lacticidven:4)  ) Recent Results (from the past 240 hour(s))  Resp Panel by RT-PCR (Flu A&B, Covid) Nasopharyngeal Swab     Status: None   Collection Time: 12/04/20  7:24 AM   Specimen: Nasopharyngeal Swab; Nasopharyngeal(NP) swabs in vial transport medium  Result Value Ref Range Status   SARS Coronavirus 2 by RT PCR NEGATIVE NEGATIVE Final    Comment: (NOTE) SARS-CoV-2 target nucleic acids are NOT DETECTED.  The SARS-CoV-2 RNA is generally detectable in upper respiratory specimens during the acute phase  of infection. The lowest concentration of SARS-CoV-2 viral copies this assay can detect is 138 copies/mL. A negative result does not preclude SARS-Cov-2 infection and should not be used as the sole basis for treatment or other patient management decisions. A negative result may occur with  improper specimen collection/handling, submission of specimen other than nasopharyngeal swab, presence of viral mutation(s) within the areas targeted by this assay, and inadequate number of viral copies(<138 copies/mL). A negative result must be combined with clinical observations, patient history, and epidemiological information. The expected result is Negative.  Fact Sheet for Patients:  EntrepreneurPulse.com.au  Fact Sheet for Healthcare Providers:  IncredibleEmployment.be  This test is no t yet approved or cleared by the [place] FDA and  has been authorized for detection and/or diagnosis of SARS-CoV-2 by FDA under an Emergency Use Authorization (EUA). This EUA will remain  in effect (meaning this test can be used) for the duration of the COVID-19 declaration under Section 564(b)(1) of the Act, 21 U.S.C.section 360bbb-3(b)(1), unless the authorization is terminated  or revoked sooner.       Influenza A by PCR NEGATIVE NEGATIVE Final   Influenza B by PCR NEGATIVE NEGATIVE Final    Comment: (NOTE) The Xpert Xpress SARS-CoV-2/FLU/RSV plus assay is intended as an aid in the diagnosis of influenza from Nasopharyngeal swab specimens and should not be used as a sole basis for treatment. Nasal washings and aspirates are unacceptable for Xpert Xpress SARS-CoV-2/FLU/RSV testing.  Fact Sheet for Patients: EntrepreneurPulse.com.au  Fact Sheet for Healthcare Providers: IncredibleEmployment.be  This test is not yet approved or cleared by the [place] FDA and has been authorized for detection and/or diagnosis of  SARS-CoV-2 by FDA under an Emergency Use Authorization (EUA). This EUA will remain in effect (meaning this test can be used) for the duration of the COVID-19 declaration under Section 564(b)(1) of the Act, 21 U.S.C. section 360bbb-3(b)(1), unless the authorization is terminated or revoked.  Performed at [facility], [address] [place], [place] [address]   Blood culture (routine x 2)     Status: None   Collection Time: 12/04/20  9:01 AM   Specimen: BLOOD LEFT HAND  Result Value Ref Range Status   Specimen Description BLOOD LEFT HAND  Final   Special Requests   Final    BOTTLES DRAWN AEROBIC AND ANAEROBIC Blood Culture results may not be optimal due to an inadequate volume of blood received in culture bottles   Culture   Final    NO GROWTH 5 DAYS Performed at [facility], [address], [place], [place] [address]    Report Status 12/09/2020 FINAL  Final  Blood culture (routine x 2)     Status: None   Collection Time: 12/04/20  9:01 AM   Specimen: BLOOD RIGHT HAND  Result Value Ref Range Status   Specimen Description BLOOD RIGHT HAND  Final   Special Requests   Final    BOTTLES DRAWN AEROBIC AND ANAEROBIC Blood Culture adequate volume   Culture   Final    NO GROWTH 5 DAYS Performed at [facility], [address] [place], [place] [address]    Report Status 12/09/2020 FINAL  Final  Expectorated Sputum Assessment w Gram Stain, Rflx to Resp Cult     Status: None   Collection Time: 12/04/20  1:41 PM   Specimen: Sputum  Result Value Ref Range Status   Specimen Description SPUTUM  Final   Special Requests NONE  Final   Sputum evaluation   Final    THIS SPECIMEN IS ACCEPTABLE FOR SPUTUM CULTURE Performed at [facility], [address], [place], [place] [address]    Report Status 12/04/2020 FINAL  Final  Culture, Respiratory w Gram Stain     Status: None   Collection Time: 12/04/20  1:41 PM   Specimen: SPU  Result Value Ref Range  Status   Specimen Description   Final    SPUTUM Performed at [facility], [address], [place], [place] [address]    Special Requests   Final    NONE Reflexed from [id number] Performed at [facility], [address] [place], [place] [address]    Gram Stain   Final    FEW SQUAMOUS EPITHELIAL CELLS PRESENT FEW WBC SEEN FEW GRAM POSITIVE COCCI    Culture   Final    RARE Normal respiratory flora-no Staph aureus or Pseudomonas seen Performed at [facility], [place] [address], [place],  [address]    Report Status 12/06/2020 FINAL  Final         Radiology Studies: DG Chest Port 1 View  Result Date: 12/09/2020 CLINICAL DATA:  Shortness of breath EXAM: PORTABLE CHEST 1 VIEW COMPARISON:  Previous studies including the examination of 12/04/2020 FINDINGS: Transverse diameter of heart is increased. There is shift of mediastinal structures to the right. There are linear densities in both lower lung fields. There is interval improvement in the aeration of both lower lung fields. There are no signs of alveolar pulmonary edema or new focal pulmonary consolidation. Right lateral costophrenic angle is indistinct. There is no pneumothorax. IMPRESSION: Cardiomegaly. There are no signs of pulmonary edema. There is interval improvement in aeration of both lower lung fields suggesting decrease in subsegmental atelectasis. Increased density in right lower lung field may be due to pleural effusion and possibly underlying infiltrates. No new focal infiltrates are seen. Reading location: [place], [place]. Electronically Signed   By: Elmer Picker M.D.   On: 12/09/2020 08:20        Scheduled Meds:  amiodarone  200 mg Oral Daily   apixaban  5 mg Oral BID   famotidine  20 mg Oral BID   guaiFENesin  600 mg Oral BID   hydrochlorothiazide  25 mg Oral Daily   insulin aspart  0-15 Units Subcutaneous TID WC   insulin aspart  0-5 Units Subcutaneous QHS    ipratropium-albuterol  3 mL Nebulization TID   levothyroxine  150 mcg Oral Daily   metoprolol succinate  50 mg Oral Daily   PARoxetine  25 mg Oral Daily   predniSONE  40 mg Oral Q breakfast   Continuous Infusions:  cefTRIAXone (ROCEPHIN)  IV Stopped (12/09/20 0911)  LOS: 5 days    Time spent: 40 min    Val Riles, MD [facility]   If 7PM-7AM, please contact night-coverage [email or web address] Password [email or web address] 12/09/2020, 2:21 PM

## 2020-12-09 NOTE — Progress Notes (Addendum)
Dr Dwyane [name] made aware that pt requesting additional med for itching, atarax ordered, made aware of overnight events, also aware that pt complaining of rash, no rash noted only slightly red chest, pt says that she is very itchy noted scratching arms neck, chest and ears, seems very anxious, made MD aware of the increase in WBC today

## 2020-12-09 NOTE — Progress Notes (Signed)
Inpatient Diabetes Program Recommendations  AACE/ADA: New Consensus Statement on Inpatient Glycemic Control   Target Ranges:  Prepandial:   less than 140 mg/dL      Peak postprandial:   less than 180 mg/dL (1-2 hours)      Critically ill patients:  140 - 180 mg/dL   Results for CHANDA, LAPERLE (MRN [id number]) as of 12/09/2020 10:53  Ref. Range 12/08/2020 07:49 12/08/2020 11:58 12/08/2020 16:20 12/08/2020 21:33 12/09/2020 08:48  Glucose-Capillary Latest Ref Range: 70 - 99 mg/dL 120 (H) 202 (H) 354 (H) 154 (H) 172 (H)    Review of Glycemic Control  Diabetes history: DM2 Outpatient Diabetes medications: Metformin 1000 mg BID Current orders for Inpatient glycemic control: Novolog 0-15 units TID with meals, Novolog 0-5 units QHS; Prednisone 40 mg QAM  Inpatient Diabetes Program Recommendations:    Insulin: If steroids are continued, please consider ordering Novolog 4 units TID with meals for meal coverage if patient eats at least 50% of meals.  Thanks, Barnie Alderman, RN, MSN, CDE Diabetes Coordinator Inpatient Diabetes Program [phone] (Team Pager from 8am to 5pm)

## 2020-12-10 LAB — CBC
HCT: 38.7 % (ref 36.0–46.0)
Hemoglobin: 12.7 g/dL (ref 12.0–15.0)
MCH: 29.4 pg (ref 26.0–34.0)
MCHC: 32.8 g/dL (ref 30.0–36.0)
MCV: 89.6 fL (ref 80.0–100.0)
Platelets: 272 10*3/uL (ref 150–400)
RBC: 4.32 MIL/uL (ref 3.87–5.11)
RDW: 15 % (ref 11.5–15.5)
WBC: 12.4 10*3/uL — ABNORMAL HIGH (ref 4.0–10.5)
nRBC: 0 % (ref 0.0–0.2)

## 2020-12-10 LAB — BASIC METABOLIC PANEL
Anion gap: 6 (ref 5–15)
BUN: 20 mg/dL (ref 8–23)
CO2: 33 mmol/L — ABNORMAL HIGH (ref 22–32)
Calcium: 9.6 mg/dL (ref 8.9–10.3)
Chloride: 97 mmol/L — ABNORMAL LOW (ref 98–111)
Creatinine, Ser: 0.79 mg/dL (ref 0.44–1.00)
GFR, Estimated: >60 mL/min (ref >60)
Glucose, Bld: 107 mg/dL — ABNORMAL HIGH (ref 70–99)
Potassium: 3.8 mmol/L (ref 3.5–5.1)
Sodium: 136 mmol/L (ref 135–145)

## 2020-12-10 LAB — GLUCOSE, CAPILLARY
Glucose-Capillary: 106 mg/dL — ABNORMAL HIGH (ref 70–99)
Glucose-Capillary: 140 mg/dL — ABNORMAL HIGH (ref 70–99)
Glucose-Capillary: 409 mg/dL — ABNORMAL HIGH (ref 70–99)
Glucose-Capillary: 289 mg/dL — ABNORMAL HIGH (ref 70–99)
Glucose-Capillary: 274 mg/dL — ABNORMAL HIGH (ref 70–99)

## 2020-12-10 MED ORDER — IPRATROPIUM-ALBUTEROL 0.5-2.5 (3) MG/3ML IN SOLN
3.0000 mL | Freq: Four times a day (QID) | RESPIRATORY_TRACT | Status: DC
Start: 2020-12-10 — End: 2020-12-13
  Administered 2020-12-10 – 2020-12-13 (×8): 3 mL via RESPIRATORY_TRACT
  Filled 2020-12-10: qty 3
  Filled 2020-12-10: qty 39
  Filled 2020-12-10 (×7): qty 3

## 2020-12-10 MED ORDER — HYDROCOD POLST-CPM POLST ER 10-8 MG/5ML PO SUER
5.0000 mL | Freq: Four times a day (QID) | ORAL | Status: DC | PRN
  Administered 2020-12-11 – 2020-12-13 (×5): 5 mL via ORAL
  Filled 2020-12-10 (×6): qty 5

## 2020-12-10 MED ORDER — FLUTICASONE FUROATE-VILANTEROL 200-25 MCG/ACT IN AEPB
1.0000 | INHALATION_SPRAY | Freq: Every day | RESPIRATORY_TRACT | Status: DC
  Administered 2020-12-10 – 2020-12-13 (×4): 1 via RESPIRATORY_TRACT
  Filled 2020-12-10: qty 28

## 2020-12-10 MED ORDER — METHYLPREDNISOLONE SODIUM SUCC 40 MG IJ SOLR
40.0000 mg | Freq: Three times a day (TID) | INTRAMUSCULAR | Status: DC
  Administered 2020-12-10: 12:00:00 40 mg via INTRAVENOUS
  Filled 2020-12-10: qty 1

## 2020-12-10 MED ORDER — IPRATROPIUM-ALBUTEROL 0.5-2.5 (3) MG/3ML IN SOLN
3.0000 mL | Freq: Four times a day (QID) | RESPIRATORY_TRACT | Status: DC
  Filled 2020-12-10: qty 3

## 2020-12-10 MED ORDER — INSULIN ASPART 100 UNIT/ML IV SOLN
20.0000 [IU] | Freq: Once | INTRAVENOUS | Status: AC
  Administered 2020-12-10: 20 [IU] via INTRAVENOUS
  Filled 2020-12-10: qty 0.2

## 2020-12-10 MED ORDER — SODIUM CHLORIDE 0.9 % IV SOLN: INTRAVENOUS | Status: DC | PRN

## 2020-12-10 MED ORDER — INSULIN REGULAR HUMAN 100 UNIT/ML IJ SOLN: 20.0000 [IU] | Freq: Once | INTRAMUSCULAR | Status: DC

## 2020-12-10 NOTE — Progress Notes (Signed)
Around 1100 Mews score 2 d/t RR 26.  Rest of VSS. Expiratory wheezes.  Patient reports of a headache.  Dr. Dwyane [name] saw patient prior to taking VS and ordered Breo inhaler, solumedrol and neb.  Will continue to monitor.

## 2020-12-10 NOTE — Progress Notes (Signed)
PROGRESS NOTE    Kirsten [name]  HEN:[id number] DOB: December 25, 1955 DOA: 12/04/2020 PCP: [name], MD  Outpatient Specialists: [facility]    Brief Narrative:   Kirsten [name] is a 65 y.o. female with medical history significant of hypertension, hyperlipidemia, diabetes mellitus, asthma, GERD, OSA on CPAP, hypothyroidism, pulmonary fibrosis, IBS, atrial fibrillation on Eliquis, s/p of gastroschisis repair, who presents with shortness of breath.   Patient states that she has shortness of breath for more than 5 days, which has been progressively worsening.  Patient has cough with yellow sputum production.  Patient also reports low grade subjective fever and chills.  No chest pain.  Denies nausea, vomiting, diarrhea or abdominal pain.  No symptoms of UTI.  Patient stated he has been consistently taking Eliquis.  Patient was having hypoxia with saturation 86% on room air and patient was on 4 L oxygen, gradually oxygen has been weaned off.  Currently she is satting above 95% on room air   Assessment & Plan:   Principal Problem:   CAP (community acquired pneumonia) Active Problems:   Hypothyroidism   Depression with anxiety   OSA on CPAP   Paroxysmal atrial fibrillation (HCC)   Pulmonary fibrosis (HCC)   Diabetes mellitus without complication (HCC)   Asthma exacerbation   Acute respiratory failure with hypoxia (HCC)   Hyperlipidemia   Hypokalemia   Acute respiratory failure with hypoxia due to CAP and asthma exacerbation in the setting of Pulmonary fibrosis:  Pt reportedly febrile at home,with several days dyspnea and productive cough. Cxr with possible infiltrate. Followed by pulm as outpt Continue supplemental O2 admission and gradually wean off - monitor O2, resume o2 as needed.  - s/p azithromycin, transition to oral doxycycline due to prolonged QTC, which was d/c'd on 10/27 due to allergic rash S/p rocephin for 7 days,  - cont steroids, de-escalate to prednisone  40 mg p.o. daily on 10/24 - urine antigens for strep and Legionella negative, sputum and blood cultures negative - close outpt pulm f/u 10/29 still significant wheezing noticed, started IV Solu-Medrol 40 every 12 hourly x2 doses today Started Breo Ellipta inhaler, continue DuoNeb every 6 hourly scheduled, continue Tussionex every 6 hourly as needed for cough  Allergic rash, noticed at night on 10/27, doxycycline was discontinued. Continue Benadryl as needed, Atarax as needed, Pepcid 20 twice daily, hydrocortisone topical cream for itching Patient is already on prednisone as above, will give Solu-Medrol 40 mg IV x2 doses on 10/29   Hypothyroidism -Synthroid   Depression with anxiety -Continue home medications   OSA  -on CPAP   Paroxysmal atrial fibrillation (HCC) -Continue amiodarone, metoprolol -Continue Eliquis   Diabetes mellitus without complication ([place]): Recent A1c 6.7, well controlled.  Patient taking metformin. Here glucose mildly elevated in setting of steroids -Sliding scale insulin   Hyperlipidemia: Patient is not taking Lipitor currently -Follow-up with PCP   Hypokalemia: Potassium 3.3 on admit -Repleted potassium -Monitor and replete as needed   DVT prophylaxis: lovenox Code Status: full Family Communication: husband updated @ bedside 10/28  Level of care: med/surg Status is: Inpatient  Remains inpt appropriate: severity of illness    Consultants:  none  Procedures: none  Antimicrobials:  Azithromycin DC'd on 10/26 ceftriaxone x 7 days last dose 10/29 10/26 doxycycline DC'd on 10/27  Subjective: No significant events overnight, patient is still having persistent itching chest not feel improvement.  Rash is not getting worse though.  Patient is still having cough and difficulty breathing and patient  was wheezing. Still started IV Solu-Medrol, Breo Ellipta and scheduled duo nebs and as needed cough syrup We will continue to monitor today and plan  for disposition tomorrow a.m. if improves    Objective: Vitals:   12/10/20 0635 12/10/20 0745 12/10/20 0747 12/10/20 1100  BP: (!) 117/52  117/65 124/67  Pulse: 68  74 73  Resp:   19 (!) 26  Temp:   98.2 F (36.8 C) 98.9 F (37.2 C)  TempSrc:    Oral  SpO2: 95% (!) 86% 90% 96%  Weight:      Height:        Intake/Output Summary (Last 24 hours) at 12/10/2020 1240 Last data filed at 12/10/2020 1147 Gross per 24 hour  Intake 450.99 ml  Output --  Net 450.99 ml   Filed Weights   12/04/20 0648  Weight: 90.7 kg    Examination:  General exam: Appears calm and comfortable  Respiratory system: Mild wheezing bilaterally Cardiovascular system: S1 & S2 heard, RRR. No JVD, murmurs, rubs, gallops or clicks. No pedal edema. Gastrointestinal system: Abdomen is nondistended, soft and nontender. No organomegaly or masses felt. Normal bowel sounds heard. Central nervous system: Alert and oriented. No focal neurological deficits. Extremities: Symmetric 5 x 5 power. Skin: Erythematous rash bilateral upper and lower extremities  Psychiatry: Judgement and insight appear normal. Mood & affect appropriate.     Data Reviewed: I have personally reviewed following labs and imaging studies  CBC: Recent Labs  Lab 12/04/20 0718 12/07/20 0419 12/08/20 0549 12/09/20 0643 12/10/20 0600  WBC 10.2 8.5 8.1 16.3* 12.4*  HGB 12.9 12.1 12.3 13.9 12.7  HCT 38.8 36.0 36.6 42.7 38.7  MCV 92.2 90.2 90.4 89.9 89.6  PLT 240 240 237 362 623   Basic Metabolic Panel: Recent Labs  Lab 12/05/20 0625 12/06/20 0536 12/07/20 0419 12/08/20 0549 12/09/20 0643 12/10/20 0600  NA 138 137 139 138 137 136  K 3.7 4.1 3.2* 3.7 4.3 3.8  CL 97* 98 100 98 95* 97*  CO2 32 32 33* 32 35* 33*  GLUCOSE 191* 234* 130* 111* 112* 107*  BUN 16 21 18 16 16 20   CREATININE 0.60 0.66 0.76 0.77 0.75 0.79  CALCIUM 10.0 9.8 9.5 9.4 9.8 9.6  MG 1.9 2.0 1.9 1.8 2.1  --   PHOS  --  3.2 3.1 2.9 3.2  --    GFR: Estimated  Creatinine Clearance: 83.2 mL/min (by C-G formula based on SCr of 0.79 mg/dL). Liver Function Tests: No results for input(s): AST, ALT, ALKPHOS, BILITOT, PROT, ALBUMIN in the last 168 hours. No results for input(s): LIPASE, AMYLASE in the last 168 hours. No results for input(s): AMMONIA in the last 168 hours. Coagulation Profile: No results for input(s): INR, PROTIME in the last 168 hours. Cardiac Enzymes: No results for input(s): CKTOTAL, CKMB, CKMBINDEX, TROPONINI in the last 168 hours. BNP (last 3 results) No results for input(s): PROBNP in the last 8760 hours. HbA1C: No results for input(s): HGBA1C in the last 72 hours.  CBG: Recent Labs  Lab 12/09/20 1142 12/09/20 1634 12/09/20 2056 12/10/20 0748 12/10/20 1102  GLUCAP 152* 264* 159* 106* 140*   Lipid Profile: No results for input(s): CHOL, HDL, LDLCALC, TRIG, CHOLHDL, LDLDIRECT in the last 72 hours. Thyroid Function Tests: No results for input(s): TSH, T4TOTAL, FREET4, T3FREE, THYROIDAB in the last 72 hours. Anemia Panel: No results for input(s): VITAMINB12, FOLATE, FERRITIN, TIBC, IRON, RETICCTPCT in the last 72 hours. Urine analysis:    Component Value  Date/Time   COLORURINE YELLOW 07/19/2020 1411   APPEARANCEUR Sl Cloudy (A) 07/19/2020 1411   APPEARANCEUR Cloudy (A) 01/01/2017 1403   LABSPEC 1.020 07/19/2020 1411   PHURINE 6.0 07/19/2020 1411   GLUCOSEU NEGATIVE 07/19/2020 1411   HGBUR NEGATIVE 07/19/2020 1411   HGBUR trace-lysed 12/20/2009 1107   BILIRUBINUR NEGATIVE 07/19/2020 1411   BILIRUBINUR neg 06/06/2020 1553   BILIRUBINUR Negative 01/01/2017 1403   KETONESUR TRACE (A) 07/19/2020 1411   PROTEINUR Negative 09/07/2019 1442   PROTEINUR 1+ (A) 01/01/2017 1403   PROTEINUR NEGATIVE 07/12/2010 0738   UROBILINOGEN 0.2 07/19/2020 1411   NITRITE POSITIVE (A) 07/19/2020 1411   LEUKOCYTESUR LARGE (A) 07/19/2020 1411   Sepsis Labs: @LABRCNTIP (procalcitonin:4,lacticidven:4)  ) Recent Results (from the past  240 hour(s))  Resp Panel by RT-PCR (Flu A&B, Covid) Nasopharyngeal Swab     Status: None   Collection Time: 12/04/20  7:24 AM   Specimen: Nasopharyngeal Swab; Nasopharyngeal(NP) swabs in vial transport medium  Result Value Ref Range Status   SARS Coronavirus 2 by RT PCR NEGATIVE NEGATIVE Final    Comment: (NOTE) SARS-CoV-2 target nucleic acids are NOT DETECTED.  The SARS-CoV-2 RNA is generally detectable in upper respiratory specimens during the acute phase of infection. The lowest concentration of SARS-CoV-2 viral copies this assay can detect is 138 copies/mL. A negative result does not preclude SARS-Cov-2 infection and should not be used as the sole basis for treatment or other patient management decisions. A negative result may occur with  improper specimen collection/handling, submission of specimen other than nasopharyngeal swab, presence of viral mutation(s) within the areas targeted by this assay, and inadequate number of viral copies(<138 copies/mL). A negative result must be combined with clinical observations, patient history, and epidemiological information. The expected result is Negative.  Fact Sheet for Patients:  EntrepreneurPulse.com.au  Fact Sheet for Healthcare Providers:  IncredibleEmployment.be  This test is no t yet approved or cleared by the [place] FDA and  has been authorized for detection and/or diagnosis of SARS-CoV-2 by FDA under an Emergency Use Authorization (EUA). This EUA will remain  in effect (meaning this test can be used) for the duration of the COVID-19 declaration under Section 564(b)(1) of the Act, 21 U.S.C.section 360bbb-3(b)(1), unless the authorization is terminated  or revoked sooner.       Influenza A by PCR NEGATIVE NEGATIVE Final   Influenza B by PCR NEGATIVE NEGATIVE Final    Comment: (NOTE) The Xpert Xpress SARS-CoV-2/FLU/RSV plus assay is intended as an aid in the diagnosis of influenza  from Nasopharyngeal swab specimens and should not be used as a sole basis for treatment. Nasal washings and aspirates are unacceptable for Xpert Xpress SARS-CoV-2/FLU/RSV testing.  Fact Sheet for Patients: EntrepreneurPulse.com.au  Fact Sheet for Healthcare Providers: IncredibleEmployment.be  This test is not yet approved or cleared by the [place] FDA and has been authorized for detection and/or diagnosis of SARS-CoV-2 by FDA under an Emergency Use Authorization (EUA). This EUA will remain in effect (meaning this test can be used) for the duration of the COVID-19 declaration under Section 564(b)(1) of the Act, 21 U.S.C. section 360bbb-3(b)(1), unless the authorization is terminated or revoked.  Performed at [facility], [address] [place], [place] [address]   Blood culture (routine x 2)     Status: None   Collection Time: 12/04/20  9:01 AM   Specimen: BLOOD LEFT HAND  Result Value Ref Range Status   Specimen Description BLOOD LEFT HAND  Final   Special  Requests   Final    BOTTLES DRAWN AEROBIC AND ANAEROBIC Blood Culture results may not be optimal due to an inadequate volume of blood received in culture bottles   Culture   Final    NO GROWTH 5 DAYS Performed at [facility], [address] [place], [place] [address]    Report Status 12/09/2020 FINAL  Final  Blood culture (routine x 2)     Status: None   Collection Time: 12/04/20  9:01 AM   Specimen: BLOOD RIGHT HAND  Result Value Ref Range Status   Specimen Description BLOOD RIGHT HAND  Final   Special Requests   Final    BOTTLES DRAWN AEROBIC AND ANAEROBIC Blood Culture adequate volume   Culture   Final    NO GROWTH 5 DAYS Performed at [facility], [address], [place], [place] [address]    Report Status 12/09/2020 FINAL  Final  Expectorated Sputum Assessment w Gram Stain, Rflx to Resp Cult     Status: None   Collection Time: 12/04/20   1:41 PM   Specimen: Sputum  Result Value Ref Range Status   Specimen Description SPUTUM  Final   Special Requests NONE  Final   Sputum evaluation   Final    THIS SPECIMEN IS ACCEPTABLE FOR SPUTUM CULTURE Performed at [facility], [address], [place], [place] [address]    Report Status 12/04/2020 FINAL  Final  Culture, Respiratory w Gram Stain     Status: None   Collection Time: 12/04/20  1:41 PM   Specimen: SPU  Result Value Ref Range Status   Specimen Description   Final    SPUTUM Performed at [facility], [address], [place], [place] [address]    Special Requests   Final    NONE Reflexed from [phone] Performed at [facility], [address] [place], [place] [address]    Gram Stain   Final    FEW SQUAMOUS EPITHELIAL CELLS PRESENT FEW WBC SEEN FEW GRAM POSITIVE COCCI    Culture   Final    RARE Normal respiratory flora-no Staph aureus or Pseudomonas seen Performed at [facility], [place] [address], [place], [place] [address]    Report Status 12/06/2020 FINAL  Final         Radiology Studies: DG Chest Port 1 View  Result Date: 12/09/2020 CLINICAL DATA:  Shortness of breath EXAM: PORTABLE CHEST 1 VIEW COMPARISON:  Previous studies including the examination of 12/04/2020 FINDINGS: Transverse diameter of heart is increased. There is shift of mediastinal structures to the right. There are linear densities in both lower lung fields. There is interval improvement in the aeration of both lower lung fields. There are no signs of alveolar pulmonary edema or new focal pulmonary consolidation. Right lateral costophrenic angle is indistinct. There is no pneumothorax. IMPRESSION: Cardiomegaly. There are no signs of pulmonary edema. There is interval improvement in aeration of both lower lung fields suggesting decrease in subsegmental atelectasis. Increased density in right lower lung field may be due to pleural effusion and possibly underlying  infiltrates. No new focal infiltrates are seen. Reading location: [place], [place]. Electronically Signed   By: Elmer Picker M.D.   On: 12/09/2020 08:20        Scheduled Meds:  amiodarone  200 mg Oral Daily   apixaban  5 mg Oral BID   famotidine  20 mg Oral BID   fluticasone furoate-vilanterol  1 puff Inhalation Daily  guaiFENesin  600 mg Oral BID   hydrochlorothiazide  25 mg Oral Daily   insulin aspart  0-15 Units Subcutaneous TID WC   insulin aspart  0-5 Units Subcutaneous QHS   ipratropium-albuterol  3 mL Nebulization Q6H   levothyroxine  150 mcg Oral Daily   methylPREDNISolone (SOLU-MEDROL) injection  40 mg Intravenous Q8H   metoprolol succinate  50 mg Oral Daily   PARoxetine  25 mg Oral Daily   predniSONE  40 mg Oral Q breakfast   Continuous Infusions:  sodium chloride Stopped (12/10/20 1044)     LOS: 6 days    Time spent: 40 min    Val Riles, MD [facility]   If 7PM-7AM, please contact night-coverage [email or web address] Password [email or web address] 12/10/2020, 12:40 PM

## 2020-12-11 LAB — CBC
HCT: 38.8 % (ref 36.0–46.0)
Hemoglobin: 13.2 g/dL (ref 12.0–15.0)
MCH: 30.6 pg (ref 26.0–34.0)
MCHC: 34 g/dL (ref 30.0–36.0)
MCV: 89.8 fL (ref 80.0–100.0)
Platelets: 298 10*3/uL (ref 150–400)
RBC: 4.32 MIL/uL (ref 3.87–5.11)
RDW: 14.6 % (ref 11.5–15.5)
WBC: 15.2 10*3/uL — ABNORMAL HIGH (ref 4.0–10.5)
nRBC: 0 % (ref 0.0–0.2)

## 2020-12-11 LAB — BASIC METABOLIC PANEL
Anion gap: 8 (ref 5–15)
BUN: 19 mg/dL (ref 8–23)
CO2: 33 mmol/L — ABNORMAL HIGH (ref 22–32)
Calcium: 9.6 mg/dL (ref 8.9–10.3)
Chloride: 95 mmol/L — ABNORMAL LOW (ref 98–111)
Creatinine, Ser: 0.79 mg/dL (ref 0.44–1.00)
GFR, Estimated: >60 mL/min (ref >60)
Glucose, Bld: 208 mg/dL — ABNORMAL HIGH (ref 70–99)
Potassium: 4 mmol/L (ref 3.5–5.1)
Sodium: 136 mmol/L (ref 135–145)

## 2020-12-11 LAB — GLUCOSE, CAPILLARY
Glucose-Capillary: 233 mg/dL — ABNORMAL HIGH (ref 70–99)
Glucose-Capillary: 187 mg/dL — ABNORMAL HIGH (ref 70–99)
Glucose-Capillary: 301 mg/dL — ABNORMAL HIGH (ref 70–99)
Glucose-Capillary: 305 mg/dL — ABNORMAL HIGH (ref 70–99)

## 2020-12-11 LAB — PHOSPHORUS: Phosphorus: 3.3 mg/dL (ref 2.5–4.6)

## 2020-12-11 LAB — MAGNESIUM: Magnesium: 2.2 mg/dL (ref 1.7–2.4)

## 2020-12-11 NOTE — Progress Notes (Signed)
PROGRESS NOTE    Kirsten [name]  WCB:[id number] DOB: 1955-12-29 DOA: 12/04/2020 PCP: [name], MD  Outpatient Specialists: [facility]    Brief Narrative:   Kirsten [name] is a 65 y.o. female with medical history significant of hypertension, hyperlipidemia, diabetes mellitus, asthma, GERD, OSA on CPAP, hypothyroidism, pulmonary fibrosis, IBS, atrial fibrillation on Eliquis, s/p of gastroschisis repair, who presents with shortness of breath.   Patient states that she has shortness of breath for more than 5 days, which has been progressively worsening.  Patient has cough with yellow sputum production.  Patient also reports low grade subjective fever and chills.  No chest pain.  Denies nausea, vomiting, diarrhea or abdominal pain.  No symptoms of UTI.  Patient stated he has been consistently taking Eliquis.  Patient was having hypoxia with saturation 86% on room air and patient was on 4 L oxygen, gradually oxygen has been weaned off.  Currently she is satting above 95% on room air   Assessment & Plan:   Principal Problem:   CAP (community acquired pneumonia) Active Problems:   Hypothyroidism   Depression with anxiety   OSA on CPAP   Paroxysmal atrial fibrillation (HCC)   Pulmonary fibrosis (HCC)   Diabetes mellitus without complication (HCC)   Asthma exacerbation   Acute respiratory failure with hypoxia (HCC)   Hyperlipidemia   Hypokalemia   Acute respiratory failure with hypoxia due to CAP and asthma exacerbation in the setting of Pulmonary fibrosis:  Pt reportedly febrile at home,with several days dyspnea and productive cough. Cxr with possible infiltrate. Followed by pulm as outpt Continue supplemental O2 admission and gradually wean off - monitor O2, resume o2 as needed.  - s/p azithromycin, transition to oral doxycycline due to prolonged QTC, which was d/c'd on 10/27 due to allergic rash S/p rocephin for 7 days,  - cont steroids, de-escalate to prednisone  40 mg p.o. daily on 10/24 - urine antigens for strep and Legionella negative, sputum and blood cultures negative - close outpt pulm f/u 10/29 still significant wheezing noticed, started IV Solu-Medrol 40 every 12 hourly x2 doses today Started Breo Ellipta inhaler, continue DuoNeb every 6 hourly scheduled, continue Tussionex every 6 hourly as needed for cough  Allergic rash, noticed at night on 10/27, doxycycline was discontinued. Continue Benadryl as needed, Atarax as needed, Pepcid 20 twice daily, hydrocortisone topical cream for itching Patient is already on prednisone as above, s/p Solu-Medrol 40 mg IV x1 dose given on 10/29 Rash and itching is gradually improving,  Hypothyroidism -Synthroid   Depression with anxiety -Continue home medications   OSA  -on CPAP   Paroxysmal atrial fibrillation (HCC) -Continue amiodarone, metoprolol -Continue Eliquis   Diabetes mellitus without complication ([place]): Recent A1c 6.7, well controlled.  Patient taking metformin. Here glucose mildly elevated in setting of steroids -Sliding scale insulin   Hyperlipidemia: Patient is not taking Lipitor currently -Follow-up with PCP   Hypokalemia: Potassium 3.3 on admit -Repleted potassium -Monitor and replete as needed   DVT prophylaxis: lovenox Code Status: full Family Communication: husband updated @ bedside 10/28  Level of care: med/surg Status is: Inpatient  Remains inpt appropriate: severity of illness    Consultants:  none  Procedures: none  Antimicrobials:  Azithromycin DC'd on 10/26 ceftriaxone x 7 days last dose 10/29 10/26 doxycycline DC'd on 10/27  Subjective: No significant events overnight, patient is still having mild cough and phlegm production but feels a lot better, resting comfortably on room air saturating well.  Patient  still becomes hypoxic having some dyspnea on exertion.  Itching and rashes comes and goes but feels improvement. Patient saw having dyspnea on  exertion and hypoxia on exertion so would like to stay 1 more day and discharge plan tomorrow a.m.     Objective: Vitals:   12/11/20 0718 12/11/20 0737 12/11/20 1020 12/11/20 1151  BP:  130/69 132/81 140/65  Pulse:  75 78 69  Resp:  (!) 24 (!) 22 18  Temp:  98 F (36.7 C) 98.1 F (36.7 C) 98.1 F (36.7 C)  TempSrc:      SpO2: 94% 94% 100% 95%  Weight:      Height:        Intake/Output Summary (Last 24 hours) at 12/11/2020 1301 Last data filed at 12/10/2020 1841 Gross per 24 hour  Intake 240 ml  Output --  Net 240 ml   Filed Weights   12/04/20 0648  Weight: 90.7 kg    Examination:  General exam: Appears calm and comfortable  Respiratory system: Mild wheezing bilaterally Cardiovascular system: S1 & S2 heard, RRR. No JVD, murmurs, rubs, gallops or clicks. No pedal edema. Gastrointestinal system: Abdomen is nondistended, soft and nontender. No organomegaly or masses felt. Normal bowel sounds heard. Central nervous system: Alert and oriented. No focal neurological deficits. Extremities: Symmetric 5 x 5 power. Skin: Erythematous rash bilateral upper and lower extremities  Psychiatry: Judgement and insight appear normal. Mood & affect appropriate.     Data Reviewed: I have personally reviewed following labs and imaging studies  CBC: Recent Labs  Lab 12/07/20 0419 12/08/20 0549 12/09/20 0643 12/10/20 0600 12/11/20 0549  WBC 8.5 8.1 16.3* 12.4* 15.2*  HGB 12.1 12.3 13.9 12.7 13.2  HCT 36.0 36.6 42.7 38.7 38.8  MCV 90.2 90.4 89.9 89.6 89.8  PLT 240 237 362 272 841   Basic Metabolic Panel: Recent Labs  Lab 12/06/20 0536 12/07/20 0419 12/08/20 0549 12/09/20 0643 12/10/20 0600 12/11/20 0549  NA 137 139 138 137 136 136  K 4.1 3.2* 3.7 4.3 3.8 4.0  CL 98 100 98 95* 97* 95*  CO2 32 33* 32 35* 33* 33*  GLUCOSE 234* 130* 111* 112* 107* 208*  BUN 21 18 16 16 20 19   CREATININE 0.66 0.76 0.77 0.75 0.79 0.79  CALCIUM 9.8 9.5 9.4 9.8 9.6 9.6  MG 2.0 1.9 1.8 2.1   --  2.2  PHOS 3.2 3.1 2.9 3.2  --  3.3   GFR: Estimated Creatinine Clearance: 83.2 mL/min (by C-G formula based on SCr of 0.79 mg/dL). Liver Function Tests: No results for input(s): AST, ALT, ALKPHOS, BILITOT, PROT, ALBUMIN in the last 168 hours. No results for input(s): LIPASE, AMYLASE in the last 168 hours. No results for input(s): AMMONIA in the last 168 hours. Coagulation Profile: No results for input(s): INR, PROTIME in the last 168 hours. Cardiac Enzymes: No results for input(s): CKTOTAL, CKMB, CKMBINDEX, TROPONINI in the last 168 hours. BNP (last 3 results) No results for input(s): PROBNP in the last 8760 hours. HbA1C: No results for input(s): HGBA1C in the last 72 hours.  CBG: Recent Labs  Lab 12/10/20 1602 12/10/20 1833 12/10/20 2034 12/11/20 0739 12/11/20 1149  GLUCAP 409* 289* 274* 233* 187*   Lipid Profile: No results for input(s): CHOL, HDL, LDLCALC, TRIG, CHOLHDL, LDLDIRECT in the last 72 hours. Thyroid Function Tests: No results for input(s): TSH, T4TOTAL, FREET4, T3FREE, THYROIDAB in the last 72 hours. Anemia Panel: No results for input(s): VITAMINB12, FOLATE, FERRITIN, TIBC, IRON, RETICCTPCT in  the last 72 hours. Urine analysis:    Component Value Date/Time   COLORURINE YELLOW 07/19/2020 1411   APPEARANCEUR Sl Cloudy (A) 07/19/2020 1411   APPEARANCEUR Cloudy (A) 01/01/2017 1403   LABSPEC 1.020 07/19/2020 1411   PHURINE 6.0 07/19/2020 1411   GLUCOSEU NEGATIVE 07/19/2020 1411   HGBUR NEGATIVE 07/19/2020 1411   HGBUR trace-lysed 12/20/2009 1107   BILIRUBINUR NEGATIVE 07/19/2020 1411   BILIRUBINUR neg 06/06/2020 1553   BILIRUBINUR Negative 01/01/2017 1403   KETONESUR TRACE (A) 07/19/2020 1411   PROTEINUR Negative 09/07/2019 1442   PROTEINUR 1+ (A) 01/01/2017 1403   PROTEINUR NEGATIVE 07/12/2010 0738   UROBILINOGEN 0.2 07/19/2020 1411   NITRITE POSITIVE (A) 07/19/2020 1411   LEUKOCYTESUR LARGE (A) 07/19/2020 1411   Sepsis  Labs: @LABRCNTIP (procalcitonin:4,lacticidven:4)  ) Recent Results (from the past 240 hour(s))  Resp Panel by RT-PCR (Flu A&B, Covid) Nasopharyngeal Swab     Status: None   Collection Time: 12/04/20  7:24 AM   Specimen: Nasopharyngeal Swab; Nasopharyngeal(NP) swabs in vial transport medium  Result Value Ref Range Status   SARS Coronavirus 2 by RT PCR NEGATIVE NEGATIVE Final    Comment: (NOTE) SARS-CoV-2 target nucleic acids are NOT DETECTED.  The SARS-CoV-2 RNA is generally detectable in upper respiratory specimens during the acute phase of infection. The lowest concentration of SARS-CoV-2 viral copies this assay can detect is 138 copies/mL. A negative result does not preclude SARS-Cov-2 infection and should not be used as the sole basis for treatment or other patient management decisions. A negative result may occur with  improper specimen collection/handling, submission of specimen other than nasopharyngeal swab, presence of viral mutation(s) within the areas targeted by this assay, and inadequate number of viral copies(<138 copies/mL). A negative result must be combined with clinical observations, patient history, and epidemiological information. The expected result is Negative.  Fact Sheet for Patients:  EntrepreneurPulse.com.au  Fact Sheet for Healthcare Providers:  IncredibleEmployment.be  This test is no t yet approved or cleared by the [place] FDA and  has been authorized for detection and/or diagnosis of SARS-CoV-2 by FDA under an Emergency Use Authorization (EUA). This EUA will remain  in effect (meaning this test can be used) for the duration of the COVID-19 declaration under Section 564(b)(1) of the Act, 21 U.S.C.section 360bbb-3(b)(1), unless the authorization is terminated  or revoked sooner.       Influenza A by PCR NEGATIVE NEGATIVE Final   Influenza B by PCR NEGATIVE NEGATIVE Final    Comment: (NOTE) The Xpert  Xpress SARS-CoV-2/FLU/RSV plus assay is intended as an aid in the diagnosis of influenza from Nasopharyngeal swab specimens and should not be used as a sole basis for treatment. Nasal washings and aspirates are unacceptable for Xpert Xpress SARS-CoV-2/FLU/RSV testing.  Fact Sheet for Patients: EntrepreneurPulse.com.au  Fact Sheet for Healthcare Providers: IncredibleEmployment.be  This test is not yet approved or cleared by the [place] FDA and has been authorized for detection and/or diagnosis of SARS-CoV-2 by FDA under an Emergency Use Authorization (EUA). This EUA will remain in effect (meaning this test can be used) for the duration of the COVID-19 declaration under Section 564(b)(1) of the Act, 21 U.S.C. section 360bbb-3(b)(1), unless the authorization is terminated or revoked.  Performed at [facility], [address] [place], [place] [address]   Blood culture (routine x 2)     Status: None   Collection Time: 12/04/20  9:01 AM   Specimen: BLOOD LEFT HAND  Result Value Ref Range Status  Specimen Description BLOOD LEFT HAND  Final   Special Requests   Final    BOTTLES DRAWN AEROBIC AND ANAEROBIC Blood Culture results may not be optimal due to an inadequate volume of blood received in culture bottles   Culture   Final    NO GROWTH 5 DAYS Performed at [facility], [address] [place], [place] [address]    Report Status 12/09/2020 FINAL  Final  Blood culture (routine x 2)     Status: None   Collection Time: 12/04/20  9:01 AM   Specimen: BLOOD RIGHT HAND  Result Value Ref Range Status   Specimen Description BLOOD RIGHT HAND  Final   Special Requests   Final    BOTTLES DRAWN AEROBIC AND ANAEROBIC Blood Culture adequate volume   Culture   Final    NO GROWTH 5 DAYS Performed at [facility], [address], [place], [place] [address]    Report Status 12/09/2020 FINAL  Final  Expectorated Sputum  Assessment w Gram Stain, Rflx to Resp Cult     Status: None   Collection Time: 12/04/20  1:41 PM   Specimen: Sputum  Result Value Ref Range Status   Specimen Description SPUTUM  Final   Special Requests NONE  Final   Sputum evaluation   Final    THIS SPECIMEN IS ACCEPTABLE FOR SPUTUM CULTURE Performed at [facility], [address], [place], [place] [address]    Report Status 12/04/2020 FINAL  Final  Culture, Respiratory w Gram Stain     Status: None   Collection Time: 12/04/20  1:41 PM   Specimen: SPU  Result Value Ref Range Status   Specimen Description   Final    SPUTUM Performed at [facility], [address] [place], [place] [address]    Special Requests   Final    NONE Reflexed from [id number] Performed at [facility], [address] [place], [place] [address]    Gram Stain   Final    FEW SQUAMOUS EPITHELIAL CELLS PRESENT FEW WBC SEEN FEW GRAM POSITIVE COCCI    Culture   Final    RARE Normal respiratory flora-no Staph aureus or Pseudomonas seen Performed at [facility], [place] [address], [place], [place] [address]    Report Status 12/06/2020 FINAL  Final         Radiology Studies: No results found.      Scheduled Meds:  amiodarone  200 mg Oral Daily   apixaban  5 mg Oral BID   famotidine  20 mg Oral BID   fluticasone furoate-vilanterol  1 puff Inhalation Daily   guaiFENesin  600 mg Oral BID   hydrochlorothiazide  25 mg Oral Daily   insulin aspart  0-15 Units Subcutaneous TID WC   insulin aspart  0-5 Units Subcutaneous QHS   ipratropium-albuterol  3 mL Nebulization Q6H   levothyroxine  150 mcg Oral Daily   metoprolol succinate  50 mg Oral Daily   PARoxetine  25 mg Oral Daily   predniSONE  40 mg Oral Q breakfast   Continuous Infusions:  sodium chloride Stopped (12/10/20 1044)     LOS: 7 days    Time spent: 40 min    Val Riles, MD [facility]   If 7PM-7AM, please contact  night-coverage [email or web address] Password [email or web address] 12/11/2020, 1:01 PM

## 2020-12-12 ENCOUNTER — Inpatient Hospital Stay (HOSPITAL_COMMUNITY)
Admit: 2020-12-12 | Discharge: 2020-12-12 | Disposition: A | Discharge status: Home / Self Care | Payer: PPO | Attending: Student | Admitting: Student

## 2020-12-12 ENCOUNTER — Inpatient Hospital Stay: Payer: PPO

## 2020-12-12 DIAGNOSIS — I339 Acute and subacute endocarditis, unspecified: Secondary | ICD-10-CM | POA: Diagnosis not present

## 2020-12-12 LAB — ECHOCARDIOGRAM COMPLETE
AR max vel: 2.81 cm2
AV Area VTI: 2.89 cm2
AV Area mean vel: 2.9 cm2
AV Mean grad: 2 mmHg
AV Peak grad: 4.7 mmHg
Ao pk vel: 1.08 m/s
Area-P 1/2: 4.26 cm2
Height: 67 in
MV VTI: 2 cm2
S' Lateral: 3 cm
Weight: 3200 oz

## 2020-12-12 LAB — GLUCOSE, CAPILLARY
Glucose-Capillary: 107 mg/dL — ABNORMAL HIGH (ref 70–99)
Glucose-Capillary: 189 mg/dL — ABNORMAL HIGH (ref 70–99)
Glucose-Capillary: 356 mg/dL — ABNORMAL HIGH (ref 70–99)
Glucose-Capillary: 284 mg/dL — ABNORMAL HIGH (ref 70–99)

## 2020-12-12 LAB — BRAIN NATRIURETIC PEPTIDE: B Natriuretic Peptide: 35.3 pg/mL (ref 0.0–100.0)

## 2020-12-12 MED ORDER — SPIRONOLACTONE 25 MG PO TABS
12.5000 mg | ORAL_TABLET | Freq: Every day | ORAL | Status: DC
  Administered 2020-12-12 – 2020-12-13 (×2): 12.5 mg via ORAL
  Filled 2020-12-12: qty 0.5
  Filled 2020-12-12: qty 1
  Filled 2020-12-12: qty 0.5
  Filled 2020-12-12: qty 1

## 2020-12-12 MED ORDER — FUROSEMIDE 10 MG/ML IJ SOLN
40.0000 mg | Freq: Once | INTRAMUSCULAR | Status: AC
  Administered 2020-12-12: 40 mg via INTRAVENOUS
  Filled 2020-12-12: qty 4

## 2020-12-12 NOTE — Progress Notes (Signed)
PROGRESS NOTE    Kirsten [name]  IRC:[id number] DOB: 1955/07/08 DOA: 12/04/2020 PCP: [name], MD  Outpatient Specialists: [facility]    Brief Narrative:   Kirsten [name] is a 65 y.o. female with medical history significant of hypertension, hyperlipidemia, diabetes mellitus, asthma, GERD, OSA on CPAP, hypothyroidism, pulmonary fibrosis, IBS, atrial fibrillation on Eliquis, s/p of gastroschisis repair, who presents with shortness of breath.   Patient states that she has shortness of breath for more than 5 days, which has been progressively worsening.  Patient has cough with yellow sputum production.  Patient also reports low grade subjective fever and chills.  No chest pain.  Denies nausea, vomiting, diarrhea or abdominal pain.  No symptoms of UTI.  Patient stated he has been consistently taking Eliquis.  Patient was having hypoxia with saturation 86% on room air and patient was on 4 L oxygen, gradually oxygen has been weaned off.  Currently she is satting above 95% on room air   Assessment & Plan:   Principal Problem:   CAP (community acquired pneumonia) Active Problems:   Hypothyroidism   Depression with anxiety   OSA on CPAP   Paroxysmal atrial fibrillation (HCC)   Pulmonary fibrosis (HCC)   Diabetes mellitus without complication (HCC)   Asthma exacerbation   Acute respiratory failure with hypoxia (HCC)   Hyperlipidemia   Hypokalemia   Acute respiratory failure with hypoxia due to CAP and asthma exacerbation in the setting of Pulmonary fibrosis:  Pt reportedly febrile at home,with several days dyspnea and productive cough. Cxr with possible infiltrate. Followed by pulm as outpt Continue supplemental O2 admission and gradually wean off - monitor O2, resume o2 as needed.  - s/p azithromycin, transition to oral doxycycline due to prolonged QTC, which was d/c'd on 10/27 due to allergic rash S/p rocephin for 7 days,  - cont steroids, de-escalate to prednisone  40 mg p.o. daily on 10/24 - urine antigens for strep and Legionella negative, sputum and blood cultures negative - close outpt pulm f/u 10/29 still significant wheezing noticed, started IV Solu-Medrol 40 every 12 hourly x2 doses today Started Breo Ellipta inhaler, continue DuoNeb every 6 hourly scheduled, continue Tussionex every 6 hourly as needed for cough  Allergic rash, noticed at night on 10/27, doxycycline was discontinued. Continue Benadryl as needed, Atarax as needed, Pepcid 20 twice daily, hydrocortisone topical cream for itching Patient is already on prednisone as above, s/p Solu-Medrol 40 mg IV x1 dose given on 10/29 Rash and itching is gradually improving,  Acute diastolic heart failure, patient was complaining of edema of dorsum of feet on 12/12/2020 BNP within normal range, CXR Stable cardiomegaly with mild central pulmonary vascular congestion. Mild right basilar subsegmental atelectasis is noted with possible small right pleural effusion. Lasix 40 mg IV x1 dose given Patient is on hydrochlorothiazide which was continued Resumed spironolactone home dose as well 10/31 TTE shows LVEF 55% and grade 2 diastolic dysfunction, no any other significant findings. Patient was advised for fluid restriction one-point Falta per day and continue HCTZ and spironolactone at home and follow with cardiology outpatient.   Hypothyroidism -Synthroid   Depression with anxiety -Continue home medications   OSA  -on CPAP   Paroxysmal atrial fibrillation (HCC) -Continue amiodarone, metoprolol -Continue Eliquis   Diabetes mellitus without complication [facility]): Recent A1c 6.7, well controlled.  Patient taking metformin. Here glucose mildly elevated in setting of steroids -Sliding scale insulin   Hyperlipidemia: Patient is not taking Lipitor currently -Follow-up with PCP  Hypokalemia: Potassium 3.3 on admit -Repleted potassium -Monitor and replete as needed   DVT prophylaxis:  lovenox Code Status: full Family Communication: husband updated @ bedside 10/28  Level of care: med/surg Status is: Inpatient  Remains inpt appropriate: severity of illness    Consultants:  none  Procedures: none  Antimicrobials:  Azithromycin DC'd on 10/26 ceftriaxone x 7 days last dose 10/29 10/26 doxycycline DC'd on 10/27  Subjective: No significant events overnight, today patient was complaining of edema in the bilateral feet, still has cough with phlegm production but shortness of breath is getting better.  Denies any chest palpitations, no any other active issues.  Patient is feeling very tired, we got chest x-ray, echocardiogram was done which shows grade 2 diastolic dysfunction. Just spoke to the patient and she feels worn off and does not feel to be discharged now, we will monitor overnight and plan to discharge tomorrow a.m.    Objective: Vitals:   12/12/20 0605 12/12/20 0803 12/12/20 1132 12/12/20 1502  BP: (!) 158/70 137/65 120/66 121/60  Pulse: 65 61 63 67  Resp: 16 18 18 18   Temp: 97.9 F (36.6 C) 97.8 F (36.6 C) 97.8 F (36.6 C) 98.5 F (36.9 C)  TempSrc: Oral  Oral Oral  SpO2: 99% 94% 92% 93%  Weight:      Height:        Intake/Output Summary (Last 24 hours) at 12/12/2020 1716 Last data filed at 12/12/2020 1409 Gross per 24 hour  Intake 960 ml  Output --  Net 960 ml   Filed Weights   12/04/20 0648  Weight: 90.7 kg    Examination:  General exam: Appears calm and comfortable  Respiratory system: Mild wheezing bilaterally Cardiovascular system: S1 & S2 heard, RRR. No JVD, murmurs, rubs, gallops or clicks. No pedal edema. Gastrointestinal system: Abdomen is nondistended, soft and nontender. No organomegaly or masses felt. Normal bowel sounds heard. Central nervous system: Alert and oriented. No focal neurological deficits. Extremities: Symmetric 5 x 5 power. Skin: Erythematous rash bilateral upper and lower extremities  Psychiatry:  Judgement and insight appear normal. Mood & affect appropriate.     Data Reviewed: I have personally reviewed following labs and imaging studies  CBC: Recent Labs  Lab 12/07/20 0419 12/08/20 0549 12/09/20 0643 12/10/20 0600 12/11/20 0549  WBC 8.5 8.1 16.3* 12.4* 15.2*  HGB 12.1 12.3 13.9 12.7 13.2  HCT 36.0 36.6 42.7 38.7 38.8  MCV 90.2 90.4 89.9 89.6 89.8  PLT 240 237 362 272 338   Basic Metabolic Panel: Recent Labs  Lab 12/06/20 0536 12/07/20 0419 12/08/20 0549 12/09/20 0643 12/10/20 0600 12/11/20 0549  NA 137 139 138 137 136 136  K 4.1 3.2* 3.7 4.3 3.8 4.0  CL 98 100 98 95* 97* 95*  CO2 32 33* 32 35* 33* 33*  GLUCOSE 234* 130* 111* 112* 107* 208*  BUN 21 18 16 16 20 19   CREATININE 0.66 0.76 0.77 0.75 0.79 0.79  CALCIUM 9.8 9.5 9.4 9.8 9.6 9.6  MG 2.0 1.9 1.8 2.1  --  2.2  PHOS 3.2 3.1 2.9 3.2  --  3.3   GFR: Estimated Creatinine Clearance: 83.2 mL/min (by C-G formula based on SCr of 0.79 mg/dL). Liver Function Tests: No results for input(s): AST, ALT, ALKPHOS, BILITOT, PROT, ALBUMIN in the last 168 hours. No results for input(s): LIPASE, AMYLASE in the last 168 hours. No results for input(s): AMMONIA in the last 168 hours. Coagulation Profile: No results for input(s): INR, PROTIME in  the last 168 hours. Cardiac Enzymes: No results for input(s): CKTOTAL, CKMB, CKMBINDEX, TROPONINI in the last 168 hours. BNP (last 3 results) No results for input(s): PROBNP in the last 8760 hours. HbA1C: No results for input(s): HGBA1C in the last 72 hours.  CBG: Recent Labs  Lab 12/11/20 1149 12/11/20 1621 12/11/20 2040 12/12/20 0805 12/12/20 1134  GLUCAP 187* 301* 305* 107* 189*   Lipid Profile: No results for input(s): CHOL, HDL, LDLCALC, TRIG, CHOLHDL, LDLDIRECT in the last 72 hours. Thyroid Function Tests: No results for input(s): TSH, T4TOTAL, FREET4, T3FREE, THYROIDAB in the last 72 hours. Anemia Panel: No results for input(s): VITAMINB12, FOLATE, FERRITIN,  TIBC, IRON, RETICCTPCT in the last 72 hours. Urine analysis:    Component Value Date/Time   COLORURINE YELLOW 07/19/2020 1411   APPEARANCEUR Sl Cloudy (A) 07/19/2020 1411   APPEARANCEUR Cloudy (A) 01/01/2017 1403   LABSPEC 1.020 07/19/2020 1411   PHURINE 6.0 07/19/2020 1411   GLUCOSEU NEGATIVE 07/19/2020 1411   HGBUR NEGATIVE 07/19/2020 1411   HGBUR trace-lysed 12/20/2009 1107   BILIRUBINUR NEGATIVE 07/19/2020 1411   BILIRUBINUR neg 06/06/2020 1553   BILIRUBINUR Negative 01/01/2017 1403   KETONESUR TRACE (A) 07/19/2020 1411   PROTEINUR Negative 09/07/2019 1442   PROTEINUR 1+ (A) 01/01/2017 1403   PROTEINUR NEGATIVE 07/12/2010 0738   UROBILINOGEN 0.2 07/19/2020 1411   NITRITE POSITIVE (A) 07/19/2020 1411   LEUKOCYTESUR LARGE (A) 07/19/2020 1411   Sepsis Labs: @LABRCNTIP (procalcitonin:4,lacticidven:4)  ) Recent Results (from the past 240 hour(s))  Resp Panel by RT-PCR (Flu A&B, Covid) Nasopharyngeal Swab     Status: None   Collection Time: 12/04/20  7:24 AM   Specimen: Nasopharyngeal Swab; Nasopharyngeal(NP) swabs in vial transport medium  Result Value Ref Range Status   SARS Coronavirus 2 by RT PCR NEGATIVE NEGATIVE Final    Comment: (NOTE) SARS-CoV-2 target nucleic acids are NOT DETECTED.  The SARS-CoV-2 RNA is generally detectable in upper respiratory specimens during the acute phase of infection. The lowest concentration of SARS-CoV-2 viral copies this assay can detect is 138 copies/mL. A negative result does not preclude SARS-Cov-2 infection and should not be used as the sole basis for treatment or other patient management decisions. A negative result may occur with  improper specimen collection/handling, submission of specimen other than nasopharyngeal swab, presence of viral mutation(s) within the areas targeted by this assay, and inadequate number of viral copies(<138 copies/mL). A negative result must be combined with clinical observations, patient history, and  epidemiological information. The expected result is Negative.  Fact Sheet for Patients:  EntrepreneurPulse.com.au  Fact Sheet for Healthcare Providers:  IncredibleEmployment.be  This test is no t yet approved or cleared by the [place] FDA and  has been authorized for detection and/or diagnosis of SARS-CoV-2 by FDA under an Emergency Use Authorization (EUA). This EUA will remain  in effect (meaning this test can be used) for the duration of the COVID-19 declaration under Section 564(b)(1) of the Act, 21 U.S.C.section 360bbb-3(b)(1), unless the authorization is terminated  or revoked sooner.       Influenza A by PCR NEGATIVE NEGATIVE Final   Influenza B by PCR NEGATIVE NEGATIVE Final    Comment: (NOTE) The Xpert Xpress SARS-CoV-2/FLU/RSV plus assay is intended as an aid in the diagnosis of influenza from Nasopharyngeal swab specimens and should not be used as a sole basis for treatment. Nasal washings and aspirates are unacceptable for Xpert Xpress SARS-CoV-2/FLU/RSV testing.  Fact Sheet for Patients: EntrepreneurPulse.com.au  Fact Sheet for Healthcare Providers:  IncredibleEmployment.be  This test is not yet approved or cleared by the [place] and has been authorized for detection and/or diagnosis of SARS-CoV-2 by FDA under an Emergency Use Authorization (EUA). This EUA will remain in effect (meaning this test can be used) for the duration of the COVID-19 declaration under Section 564(b)(1) of the Act, 21 U.S.C. section 360bbb-3(b)(1), unless the authorization is terminated or revoked.  Performed at [facility], [address] [place], [place] [address]   Blood culture (routine x 2)     Status: None   Collection Time: 12/04/20  9:01 AM   Specimen: BLOOD LEFT HAND  Result Value Ref Range Status   Specimen Description BLOOD LEFT HAND  Final   Special Requests   Final     BOTTLES DRAWN AEROBIC AND ANAEROBIC Blood Culture results may not be optimal due to an inadequate volume of blood received in culture bottles   Culture   Final    NO GROWTH 5 DAYS Performed at [facility], [address] [place], [place] [address]    Report Status 12/09/2020 FINAL  Final  Blood culture (routine x 2)     Status: None   Collection Time: 12/04/20  9:01 AM   Specimen: BLOOD RIGHT HAND  Result Value Ref Range Status   Specimen Description BLOOD RIGHT HAND  Final   Special Requests   Final    BOTTLES DRAWN AEROBIC AND ANAEROBIC Blood Culture adequate volume   Culture   Final    NO GROWTH 5 DAYS Performed at [facility], [address], [place], [place] [address]    Report Status 12/09/2020 FINAL  Final  Expectorated Sputum Assessment w Gram Stain, Rflx to Resp Cult     Status: None   Collection Time: 12/04/20  1:41 PM   Specimen: Sputum  Result Value Ref Range Status   Specimen Description SPUTUM  Final   Special Requests NONE  Final   Sputum evaluation   Final    THIS SPECIMEN IS ACCEPTABLE FOR SPUTUM CULTURE Performed at [facility], [address], [place], [place] [address]    Report Status 12/04/2020 FINAL  Final  Culture, Respiratory w Gram Stain     Status: None   Collection Time: 12/04/20  1:41 PM   Specimen: SPU  Result Value Ref Range Status   Specimen Description   Final    SPUTUM Performed at [facility], [address] [place], [place] [address]    Special Requests   Final    NONE Reflexed from [id number] Performed at [facility], [address] [place], [place] [address]    Gram Stain   Final    FEW SQUAMOUS EPITHELIAL CELLS PRESENT FEW WBC SEEN FEW GRAM POSITIVE COCCI    Culture   Final    RARE Normal respiratory flora-no Staph aureus or Pseudomonas seen Performed at [facility], [place] [address], [place],  [address]    Report Status 12/06/2020 FINAL  Final          Radiology Studies: DG Chest Port 1 View  Result Date: 12/12/2020 CLINICAL DATA:  Shortness of breath. EXAM: PORTABLE CHEST 1 VIEW COMPARISON:  December 09, 2020. FINDINGS: Stable cardiomegaly with possible central pulmonary vascular congestion. No pneumothorax is noted. Left lung is clear. Mild right basilar subsegmental atelectasis is noted with possible small pleural effusion. Bony thorax is unremarkable. IMPRESSION: Stable cardiomegaly with mild central pulmonary vascular congestion. Mild right basilar subsegmental atelectasis  is noted with possible small right pleural effusion. Electronically Signed   By: Marijo Conception M.D.   On: 12/12/2020 11:06   ECHOCARDIOGRAM COMPLETE  Result Date: 12/12/2020    ECHOCARDIOGRAM REPORT   Patient Name:   Kirsten [name] Date of Exam: 12/12/2020 Medical Rec #:  [id number]        Height:       67.0 in Accession #:    [id number]       Weight:       200.0 lb Date of Birth:  1955-07-15        BSA:          2.022 m Patient Age:    78 years         BP:           120/66 mmHg Patient Gender: F                HR:           63 bpm. Exam Location:  [facility] Procedure: 2D Echo, Cardiac Doppler and Color Doppler Indications:     SBE I33.9  History:         Patient has no prior history of Echocardiogram examinations.                  Arrythmias:Atrial Fibrillation; Risk Factors:Sleep Apnea and                  Hypertension. Pulmonary fibrosis.  Sonographer:     Sherrie Sport Referring Phys:  [id number] Val Riles Diagnosing Phys: Nelva Bush MD  Sonographer Comments: Technically difficult study due to poor echo windows. Pt has pulmonary fibrosis. IMPRESSIONS  1. Left ventricular ejection fraction, by estimation, is >55%. The left ventricle has normal function. Left ventricular endocardial border not optimally defined to evaluate regional wall motion. There is mild left ventricular hypertrophy. Left ventricular diastolic parameters are consistent with Grade II diastolic  dysfunction (pseudonormalization). Elevated left atrial pressure.  2. Right ventricular systolic function was not well visualized. The right ventricular size is mildly enlarged. Tricuspid regurgitation signal is inadequate for assessing PA pressure.  3. Left atrial size was mildly dilated.  4. The mitral valve was not well visualized. Trivial mitral valve regurgitation. No evidence of mitral stenosis.  5. The aortic valve was not well visualized. Aortic valve regurgitation is not visualized. No aortic stenosis is present. FINDINGS  Left Ventricle: Left ventricular ejection fraction, by estimation, is >55%. The left ventricle has normal function. Left ventricular endocardial border not optimally defined to evaluate regional wall motion. The left ventricular internal cavity size was  normal in size. There is mild left ventricular hypertrophy. Left ventricular diastolic parameters are consistent with Grade II diastolic dysfunction (pseudonormalization). Elevated left atrial pressure. Right Ventricle: The right ventricular size is mildly enlarged. Right vetricular wall thickness was not well visualized. Right ventricular systolic function was not well visualized. Tricuspid regurgitation signal is inadequate for assessing PA pressure. Left Atrium: Left atrial size was mildly dilated. Right Atrium: Right atrial size was normal in size. Pericardium: The pericardium was not well visualized. Mitral Valve: The mitral valve was not well visualized. Trivial mitral valve regurgitation. No evidence of mitral valve stenosis. MV peak gradient, 3.8 mmHg. The mean mitral valve gradient is 2.0 mmHg. Tricuspid Valve: The tricuspid valve is not well visualized. Tricuspid valve regurgitation is trivial. Aortic Valve: The aortic valve was not well visualized. Aortic valve regurgitation is not visualized. No aortic stenosis is present. Aortic  valve mean gradient measures 2.0 mmHg. Aortic valve peak gradient measures 4.7 mmHg. Aortic valve  area, by VTI measures 2.89 cm. Pulmonic Valve: The pulmonic valve was not well visualized. Pulmonic valve regurgitation is not visualized. No evidence of pulmonic stenosis. Aorta: The aortic root is normal in size and structure. Pulmonary Artery: The pulmonary artery is not well seen. Venous: The inferior vena cava was not well visualized. IAS/Shunts: The interatrial septum was not well visualized.  LEFT VENTRICLE PLAX 2D LVIDd:         4.20 cm   Diastology LVIDs:         3.00 cm   LV e' medial:    5.11 cm/s LV PW:         0.95 cm   LV E/e' medial:  17.7 LV IVS:        1.17 cm   LV e' lateral:   9.36 cm/s LVOT diam:     2.10 cm   LV E/e' lateral: 9.7 LV SV:         56 LV SV Index:   28 LVOT Area:     3.46 cm  RIGHT VENTRICLE RV Basal diam:  4.50 cm RV S prime:     11.70 cm/s LEFT ATRIUM           Index        RIGHT ATRIUM           Index LA diam:      3.80 cm 1.88 cm/m   RA Area:     17.50 cm LA Vol (A4C): 65.9 ml 32.59 ml/m  RA Volume:   44.00 ml  21.76 ml/m  AORTIC VALVE                    PULMONIC VALVE AV Area (Vmax):    2.81 cm     PV Vmax:        0.74 m/s AV Area (Vmean):   2.90 cm     PV Vmean:       48.500 cm/s AV Area (VTI):     2.89 cm     PV VTI:         0.124 m AV Vmax:           108.00 cm/s  PV Peak grad:   2.2 mmHg AV Vmean:          61.100 cm/s  PV Mean grad:   1.0 mmHg AV VTI:            0.193 m      RVOT Peak grad: 4 mmHg AV Peak Grad:      4.7 mmHg AV Mean Grad:      2.0 mmHg LVOT Vmax:         87.50 cm/s LVOT Vmean:        51.100 cm/s LVOT VTI:          0.161 m LVOT/AV VTI ratio: 0.83  AORTA Ao Root diam: 3.30 cm MITRAL VALVE MV Area (PHT): 4.26 cm    SHUNTS MV Area VTI:   2.00 cm    Systemic VTI:  0.16 m MV Peak grad:  3.8 mmHg    Systemic Diam: 2.10 cm MV Mean grad:  2.0 mmHg    Pulmonic VTI:  0.125 m MV Vmax:       0.98 m/s MV Vmean:      60.0 cm/s MV Decel Time: 178 msec MV E velocity: 90.40 cm/s MV A velocity: 78.40 cm/s  MV E/A ratio:  1.15 Nelva Bush MD Electronically signed  by Nelva Bush MD Signature Date/Time: 12/12/2020/2:45:55 PM    Final         Scheduled Meds:  amiodarone  200 mg Oral Daily   apixaban  5 mg Oral BID   famotidine  20 mg Oral BID   fluticasone furoate-vilanterol  1 puff Inhalation Daily   guaiFENesin  600 mg Oral BID   hydrochlorothiazide  25 mg Oral Daily   insulin aspart  0-15 Units Subcutaneous TID WC   insulin aspart  0-5 Units Subcutaneous QHS   ipratropium-albuterol  3 mL Nebulization Q6H   levothyroxine  150 mcg Oral Daily   metoprolol succinate  50 mg Oral Daily   PARoxetine  25 mg Oral Daily   predniSONE  40 mg Oral Q breakfast   spironolactone  12.5 mg Oral Daily   Continuous Infusions:  sodium chloride Stopped (12/10/20 1044)     LOS: 8 days    Time spent: 40 min    Val Riles, MD [facility]   If 7PM-7AM, please contact night-coverage [email or web address] Password [email or web address] 12/12/2020, 5:16 PM

## 2020-12-12 NOTE — Progress Notes (Signed)
*  PRELIMINARY RESULTS* Echocardiogram 2D Echocardiogram has been performed.  Sherrie Sport 12/12/2020, 1:44 PM

## 2020-12-12 NOTE — Progress Notes (Signed)
*  PRELIMINARY RESULTS* Echocardiogram 2D Echocardiogram has been performed.  Kirsten [name] 12/12/2020, 1:43 PM

## 2020-12-13 DIAGNOSIS — J9601 Acute respiratory failure with hypoxia: Secondary | ICD-10-CM

## 2020-12-13 LAB — GLUCOSE, CAPILLARY
Glucose-Capillary: 201 mg/dL — ABNORMAL HIGH (ref 70–99)
Glucose-Capillary: 141 mg/dL — ABNORMAL HIGH (ref 70–99)

## 2020-12-13 MED ORDER — DM-GUAIFENESIN ER 30-600 MG PO TB12
1.0000 | ORAL_TABLET | Freq: Two times a day (BID) | ORAL | 0 refills | Status: DC | PRN
Start: 2020-12-13 — End: 2021-03-22

## 2020-12-13 NOTE — Progress Notes (Signed)
Patient being discharged home. IV removed before discharge. Went over discharge instructions and medications with patient. She stated that she understood and all questions were answered. Patient going home POV with husband.

## 2020-12-13 NOTE — Discharge Summary (Signed)
Physician Discharge Summary  Kirsten [name] YIF:[id number] DOB: [birth year] DOA: 12/04/2020  PCP: [name], MD  Admit date: 12/04/2020 Discharge date: 12/13/2020  Admitted From: Home Disposition: Home  Recommendations for Outpatient Follow-up:  Follow up with PCP in 1-2 weeks Please obtain BMP/CBC in one week Please follow up on the following pending results: None  Home Health: No Equipment/Devices: None Discharge Condition: Stable CODE STATUS: Full Diet recommendation: Heart Healthy / Carb Modified   Brief/Interim Summary: Kirsten [name] is a 64 y.o. female with medical history significant of hypertension, hyperlipidemia, diabetes mellitus, asthma, GERD, OSA on CPAP, hypothyroidism, pulmonary fibrosis, IBS, atrial fibrillation on Eliquis, s/p of gastroschisis repair, who presents with shortness of breath. She was admitted for community-acquired pneumonia.  Initially hypoxic requiring supplemental oxygen which was later weaned off, currently saturating well on room air. Acute respiratory failure with hypoxia most likely secondary to community-acquired pneumonia which also resulted in asthma exacerbation in the setting of pulmonary fibrosis. Patient completed a course of antibiotics and steroids. Sputum and blood cultures negative, urinary antigen for strep and Legionella negative. Patient can continue her home medications and follow-up with her pulmonologist for further recommendations.  Patient develops a rash which was thought to be secondary to doxycycline.  Improved with Benadryl and discontinuation of doxycycline.  Patient also had acute diastolic heart failure during current hospitalization.  Echocardiogram was done with complaint of lower extremity edema.  BNP was within normal range.  Echocardiogram with normal EF and grade 2 diastolic dysfunction.  Mildly dilated right ventricle.  Patient will continue home dose of HCTZ and spironolactone and follow-up with cardiology  as an outpatient.  Patient will continue rest of her home medications and follow-up with her providers.  Discharge Diagnoses:  Principal Problem:   CAP (community acquired pneumonia) Active Problems:   Hypothyroidism   Depression with anxiety   OSA on CPAP   Paroxysmal atrial fibrillation (HCC)   Pulmonary fibrosis (HCC)   Diabetes mellitus without complication (HCC)   Asthma exacerbation   Acute respiratory failure with hypoxia (HCC)   Hyperlipidemia   Hypokalemia    Discharge Instructions  Discharge Instructions     Diet - low sodium heart healthy   Complete by: As directed    Discharge instructions   Complete by: As directed    It was pleasure taking care of you. Please keep yourself well-hydrated and continue taking your medications. Follow-up with your primary care doctor for further recommendations.   Increase activity slowly   Complete by: As directed       Allergies as of 12/13/2020       Reactions   Factive [gemifloxacin Mesylate] Rash   Crestor [rosuvastatin]    GI upset   Sulfonamide Derivatives    REACTION: rash   Gemifloxacin Rash   Penicillins Hives, Rash   Has patient had a PCN reaction causing immediate rash, facial/tongue/throat swelling, SOB or lightheadedness with hypotension: No Has patient had a PCN reaction causing severe rash involving mucus membranes or skin necrosis: No Has patient had a PCN reaction that required hospitalization No Has patient had a PCN reaction occurring within the last 10 years: No If all of the above answers are "NO", then may proceed with Cephalosporin use.        Medication List     TAKE these medications    albuterol 108 (90 Base) MCG/ACT inhaler Commonly known as: VENTOLIN HFA Inhale 2 puffs into the lungs every 6 (six) hours as needed for wheezing  or shortness of breath.   amiodarone 200 MG tablet Commonly known as: PACERONE Take 1 tablet (200 mg total) by mouth daily.   atorvastatin 40 MG  tablet Commonly known as: LIPITOR TAKE 1 TABLET(40 MG) BY MOUTH DAILY   dextromethorphan-guaiFENesin 30-600 MG 12hr tablet Commonly known as: MUCINEX DM Take 1 tablet by mouth 2 (two) times daily as needed for cough.   Eliquis 5 MG Tabs tablet Generic drug: apixaban Take 1 tablet (5 mg total) by mouth 2 (two) times daily.   FreeStyle Libre 2 Reader [place] 1 Act by Does not apply route daily.   FreeStyle Libre 2 Sensor Misc 1 Act by Does not apply route daily.   hydrochlorothiazide 25 MG tablet Commonly known as: HYDRODIURIL Take 1 tablet by mouth daily.   levothyroxine 150 MCG tablet Commonly known as: SYNTHROID Take 1 tablet (150 mcg total) by mouth daily.   metFORMIN 1000 MG tablet Commonly known as: GLUCOPHAGE One po BID   metoprolol succinate 50 MG 24 hr tablet Commonly known as: TOPROL-XL Take 1 tablet (50 mg total) by mouth daily.   PARoxetine 25 MG 24 hr tablet Commonly known as: PAXIL-CR TAKE 1 TABLET(25 MG) BY MOUTH DAILY   spironolactone 25 MG tablet Commonly known as: ALDACTONE Take 12.5 mg by mouth daily.   Trelegy Ellipta 100-62.5-25 MCG/ACT Aepb Generic drug: Fluticasone-Umeclidin-Vilant Inhale 1 puff into the lungs daily.   zolpidem 10 MG tablet Commonly known as: AMBIEN TAKE ONE TABLET BY MOUTH ONE TIME DAILY What changed:  how much to take when to take this reasons to take this additional instructions        Follow-up Information     [name], MD. Schedule an appointment as soon as possible for a visit in 1 week(s).   Specialty: Internal Medicine Contact information: [place] [address] [phone]                Allergies  Allergen Reactions   Factive [Gemifloxacin Mesylate] Rash   Crestor [Rosuvastatin]     GI upset   Sulfonamide Derivatives     REACTION: rash   Gemifloxacin Rash   Penicillins Hives and Rash    Has patient had a PCN reaction causing immediate rash, facial/tongue/throat  swelling, SOB or lightheadedness with hypotension: No Has patient had a PCN reaction causing severe rash involving mucus membranes or skin necrosis: No Has patient had a PCN reaction that required hospitalization No Has patient had a PCN reaction occurring within the last 10 years: No If all of the above answers are "NO", then may proceed with Cephalosporin use.     Consultations: None  Procedures/Studies: DG Chest 2 View  Result Date: 12/04/2020 CLINICAL DATA:  65 year old female with shortness of breath, cough. EXAM: CHEST - 2 VIEW COMPARISON:  09/27/2020 chest radiographs and earlier. FINDINGS: Chronic volume loss in the right hemithorax, not significantly changed from a chest CT last year. Stable lung volumes and mediastinal contours. Chronic elevation of the right hemidiaphragm and patchy right lung base opacity is stable since August. Streaky left lung base opacity has mildly increased. No pneumothorax, pulmonary edema or pleural effusion. Visualized tracheal air column is within normal limits. No acute osseous abnormality identified. IMPRESSION: 1. Difficult to exclude acute infection at the left lung base, where opacity has increased but is superimposed on chronic atelectasis or scarring. No pleural effusion. 2. Ongoing volume loss in the right chest, not significantly changed from a CT last year which demonstrated no  obstructing airway lesion. Electronically Signed   By: Genevie Ann M.D.   On: 12/04/2020 07:59   DG Chest Port 1 View  Result Date: 12/12/2020 CLINICAL DATA:  Shortness of breath. EXAM: PORTABLE CHEST 1 VIEW COMPARISON:  December 09, 2020. FINDINGS: Stable cardiomegaly with possible central pulmonary vascular congestion. No pneumothorax is noted. Left lung is clear. Mild right basilar subsegmental atelectasis is noted with possible small pleural effusion. Bony thorax is unremarkable. IMPRESSION: Stable cardiomegaly with mild central pulmonary vascular congestion. Mild right  basilar subsegmental atelectasis is noted with possible small right pleural effusion. Electronically Signed   By: Marijo Conception M.D.   On: 12/12/2020 11:06   DG Chest Port 1 View  Result Date: 12/09/2020 CLINICAL DATA:  Shortness of breath EXAM: PORTABLE CHEST 1 VIEW COMPARISON:  Previous studies including the examination of 12/04/2020 FINDINGS: Transverse diameter of heart is increased. There is shift of mediastinal structures to the right. There are linear densities in both lower lung fields. There is interval improvement in the aeration of both lower lung fields. There are no signs of alveolar pulmonary edema or new focal pulmonary consolidation. Right lateral costophrenic angle is indistinct. There is no pneumothorax. IMPRESSION: Cardiomegaly. There are no signs of pulmonary edema. There is interval improvement in aeration of both lower lung fields suggesting decrease in subsegmental atelectasis. Increased density in right lower lung field may be due to pleural effusion and possibly underlying infiltrates. No new focal infiltrates are seen. Reading location: [place], [place]. Electronically Signed   By: Elmer Picker M.D.   On: 12/09/2020 08:20   ECHOCARDIOGRAM COMPLETE  Result Date: 12/12/2020    ECHOCARDIOGRAM REPORT   Patient Name:   Kirsten [name] Date of Exam: 12/12/2020 Medical Rec #:  [id number]        Height:       67.0 in Accession #:    [id number]       Weight:       200.0 lb Date of Birth:  Feb 23, 1955        BSA:          2.022 m Patient Age:    16 years         BP:           120/66 mmHg Patient Gender: F                HR:           63 bpm. Exam Location:  [facility] Procedure: 2D Echo, Cardiac Doppler and Color Doppler Indications:     SBE I33.9  History:         Patient has no prior history of Echocardiogram examinations.                  Arrythmias:Atrial Fibrillation; Risk Factors:Sleep Apnea and                  Hypertension. Pulmonary fibrosis.  Sonographer:     Sherrie Sport  Referring Phys:  [id number] Val Riles Diagnosing Phys: Nelva Bush MD  Sonographer Comments: Technically difficult study due to poor echo windows. Pt has pulmonary fibrosis. IMPRESSIONS  1. Left ventricular ejection fraction, by estimation, is >55%. The left ventricle has normal function. Left ventricular endocardial border not optimally defined to evaluate regional wall motion. There is mild left ventricular hypertrophy. Left ventricular diastolic parameters are consistent with Grade II diastolic dysfunction (pseudonormalization). Elevated left atrial pressure.  2. Right ventricular systolic function was not well visualized.  The right ventricular size is mildly enlarged. Tricuspid regurgitation signal is inadequate for assessing PA pressure.  3. Left atrial size was mildly dilated.  4. The mitral valve was not well visualized. Trivial mitral valve regurgitation. No evidence of mitral stenosis.  5. The aortic valve was not well visualized. Aortic valve regurgitation is not visualized. No aortic stenosis is present. FINDINGS  Left Ventricle: Left ventricular ejection fraction, by estimation, is >55%. The left ventricle has normal function. Left ventricular endocardial border not optimally defined to evaluate regional wall motion. The left ventricular internal cavity size was  normal in size. There is mild left ventricular hypertrophy. Left ventricular diastolic parameters are consistent with Grade II diastolic dysfunction (pseudonormalization). Elevated left atrial pressure. Right Ventricle: The right ventricular size is mildly enlarged. Right vetricular wall thickness was not well visualized. Right ventricular systolic function was not well visualized. Tricuspid regurgitation signal is inadequate for assessing PA pressure. Left Atrium: Left atrial size was mildly dilated. Right Atrium: Right atrial size was normal in size. Pericardium: The pericardium was not well visualized. Mitral Valve: The mitral valve was  not well visualized. Trivial mitral valve regurgitation. No evidence of mitral valve stenosis. MV peak gradient, 3.8 mmHg. The mean mitral valve gradient is 2.0 mmHg. Tricuspid Valve: The tricuspid valve is not well visualized. Tricuspid valve regurgitation is trivial. Aortic Valve: The aortic valve was not well visualized. Aortic valve regurgitation is not visualized. No aortic stenosis is present. Aortic valve mean gradient measures 2.0 mmHg. Aortic valve peak gradient measures 4.7 mmHg. Aortic valve area, by VTI measures 2.89 cm. Pulmonic Valve: The pulmonic valve was not well visualized. Pulmonic valve regurgitation is not visualized. No evidence of pulmonic stenosis. Aorta: The aortic root is normal in size and structure. Pulmonary Artery: The pulmonary artery is not well seen. Venous: The inferior vena cava was not well visualized. IAS/Shunts: The interatrial septum was not well visualized.  LEFT VENTRICLE PLAX 2D LVIDd:         4.20 cm   Diastology LVIDs:         3.00 cm   LV e' medial:    5.11 cm/s LV PW:         0.95 cm   LV E/e' medial:  17.7 LV IVS:        1.17 cm   LV e' lateral:   9.36 cm/s LVOT diam:     2.10 cm   LV E/e' lateral: 9.7 LV SV:         56 LV SV Index:   28 LVOT Area:     3.46 cm  RIGHT VENTRICLE RV Basal diam:  4.50 cm RV S prime:     11.70 cm/s LEFT ATRIUM           Index        RIGHT ATRIUM           Index LA diam:      3.80 cm 1.88 cm/m   RA Area:     17.50 cm LA Vol (A4C): 65.9 ml 32.59 ml/m  RA Volume:   44.00 ml  21.76 ml/m  AORTIC VALVE                    PULMONIC VALVE AV Area (Vmax):    2.81 cm     PV Vmax:        0.74 m/s AV Area (Vmean):   2.90 cm     PV Vmean:  48.500 cm/s AV Area (VTI):     2.89 cm     PV VTI:         0.124 m AV Vmax:           108.00 cm/s  PV Peak grad:   2.2 mmHg AV Vmean:          61.100 cm/s  PV Mean grad:   1.0 mmHg AV VTI:            0.193 m      RVOT Peak grad: 4 mmHg AV Peak Grad:      4.7 mmHg AV Mean Grad:      2.0 mmHg LVOT Vmax:          87.50 cm/s LVOT Vmean:        51.100 cm/s LVOT VTI:          0.161 m LVOT/AV VTI ratio: 0.83  AORTA Ao Root diam: 3.30 cm MITRAL VALVE MV Area (PHT): 4.26 cm    SHUNTS MV Area VTI:   2.00 cm    Systemic VTI:  0.16 m MV Peak grad:  3.8 mmHg    Systemic Diam: 2.10 cm MV Mean grad:  2.0 mmHg    Pulmonic VTI:  0.125 m MV Vmax:       0.98 m/s MV Vmean:      60.0 cm/s MV Decel Time: 178 msec MV E velocity: 90.40 cm/s MV A velocity: 78.40 cm/s MV E/A ratio:  1.15 Harrell Gave End MD Electronically signed by Nelva Bush MD Signature Date/Time: 12/12/2020/2:45:55 PM    Final    CT MAXILLOFACIAL WO CONTRAST  Result Date: 11/18/2020 CLINICAL DATA:  65 year old female with persistent cough and sinus drainage for several months. Congestion. EXAM: CT MAXILLOFACIAL WITHOUT CONTRAST TECHNIQUE: Multidetector CT images of the paranasal sinuses were obtained using the standard protocol without intravenous contrast. COMPARISON:  None. FINDINGS: Paranasal sinuses: Frontal: Normally aerated. Patent frontal sinus drainage pathways. Ethmoid: Normally aerated. Maxillary: Trace peripheral maxillary mucosal thickening, more so on the right (series 3, image 41). But otherwise well aerated. Sphenoid: Normally aerated. Patent sphenoid ostia on series 3, image 25. Right ostiomeatal unit: Patent on coronal image 21. Left ostiomeatal unit: Patent on coronal image 19. Nasal passages: Intact nasal septum with mild mostly leftward septal deviation. Bilateral paradoxical rotation of the middle turbinates (coronal image 22). But well aerated paranasal sinus. Minimal retained secretions in the posterior right nasal cavity. Olfactory recesses are clear. Anatomy: Anterior ethmoidal artery position on coronal image 22 with some pneumatization superior to the notch on the right. Choose 2 Presellar sphenoid pneumatization pattern. No clinoid process pneumatization or regional dehiscence identified. No Onodi cell. Bilateral paradoxical rotation of  the middle turbinates (coronal image 22). Other: Negative for age visible noncontrast brain parenchyma. Visualized scalp soft tissues are within normal limits. Visualized orbit soft tissues are within normal limits. Negative visible noncontrast deep soft tissue spaces of face. Small postinflammatory calcification of the right palatine tonsil. No acute maxillary dental finding. Tympanic cavities and mastoids are clear. No acute osseous abnormality identified. IMPRESSION: 1. Well aerated paranasal sinuses; trace maxillary mucosal thickening. Patent sinus drainage pathways. 2. Well aerated nasal cavity with mild leftward septal deviation. Trace retained secretions. Electronically Signed   By: Genevie Ann M.D.   On: 11/18/2020 07:47    Subjective: Patient was seen and examined today.  She was feeling much improved and ready for discharge.  Discharge Exam: Vitals:   12/12/20 1940 12/12/20 2052  BP:  Marland Kitchen)  115/54  Pulse:  61  Resp:  16  Temp:  97.8 F (36.6 C)  SpO2: 94% 98%   Vitals:   12/12/20 1132 12/12/20 1502 12/12/20 1940 12/12/20 2052  BP: 120/66 121/60  (!) 115/54  Pulse: 63 67  61  Resp: 18 18  16   Temp: 97.8 F (36.6 C) 98.5 F (36.9 C)  97.8 F (36.6 C)  TempSrc: Oral Oral  Oral  SpO2: 92% 93% 94% 98%  Weight:      Height:        General: Pt is alert, awake, not in acute distress Cardiovascular: RRR, S1/S2 +, no rubs, no gallops Respiratory: CTA bilaterally, no wheezing, no rhonchi Abdominal: Soft, NT, ND, bowel sounds + Extremities: no edema, no cyanosis   The results of significant diagnostics from this hospitalization (including imaging, microbiology, ancillary and laboratory) are listed below for reference.    Microbiology: Recent Results (from the past 240 hour(s))  Resp Panel by RT-PCR (Flu A&B, Covid) Nasopharyngeal Swab     Status: None   Collection Time: 12/04/20  7:24 AM   Specimen: Nasopharyngeal Swab; Nasopharyngeal(NP) swabs in vial transport medium  Result  Value Ref Range Status   SARS Coronavirus 2 by RT PCR NEGATIVE NEGATIVE Final    Comment: (NOTE) SARS-CoV-2 target nucleic acids are NOT DETECTED.  The SARS-CoV-2 RNA is generally detectable in upper respiratory specimens during the acute phase of infection. The lowest concentration of SARS-CoV-2 viral copies this assay can detect is 138 copies/mL. A negative result does not preclude SARS-Cov-2 infection and should not be used as the sole basis for treatment or other patient management decisions. A negative result may occur with  improper specimen collection/handling, submission of specimen other than nasopharyngeal swab, presence of viral mutation(s) within the areas targeted by this assay, and inadequate number of viral copies(<138 copies/mL). A negative result must be combined with clinical observations, patient history, and epidemiological information. The expected result is Negative.  Fact Sheet for Patients:  EntrepreneurPulse.com.au  Fact Sheet for Healthcare Providers:  IncredibleEmployment.be  This test is no t yet approved or cleared by the [place] FDA and  has been authorized for detection and/or diagnosis of SARS-CoV-2 by FDA under an Emergency Use Authorization (EUA). This EUA will remain  in effect (meaning this test can be used) for the duration of the COVID-19 declaration under Section 564(b)(1) of the Act, 21 U.S.C.section 360bbb-3(b)(1), unless the authorization is terminated  or revoked sooner.       Influenza A by PCR NEGATIVE NEGATIVE Final   Influenza B by PCR NEGATIVE NEGATIVE Final    Comment: (NOTE) The Xpert Xpress SARS-CoV-2/FLU/RSV plus assay is intended as an aid in the diagnosis of influenza from Nasopharyngeal swab specimens and should not be used as a sole basis for treatment. Nasal washings and aspirates are unacceptable for Xpert Xpress SARS-CoV-2/FLU/RSV testing.  Fact Sheet for  Patients: EntrepreneurPulse.com.au  Fact Sheet for Healthcare Providers: IncredibleEmployment.be  This test is not yet approved or cleared by the [place] FDA and has been authorized for detection and/or diagnosis of SARS-CoV-2 by FDA under an Emergency Use Authorization (EUA). This EUA will remain in effect (meaning this test can be used) for the duration of the COVID-19 declaration under Section 564(b)(1) of the Act, 21 U.S.C. section 360bbb-3(b)(1), unless the authorization is terminated or revoked.  Performed at [facility], [address] [place], [place] [address]   Blood culture (routine x 2)     Status: None  Collection Time: 12/04/20  9:01 AM   Specimen: BLOOD LEFT HAND  Result Value Ref Range Status   Specimen Description BLOOD LEFT HAND  Final   Special Requests   Final    BOTTLES DRAWN AEROBIC AND ANAEROBIC Blood Culture results may not be optimal due to an inadequate volume of blood received in culture bottles   Culture   Final    NO GROWTH 5 DAYS Performed at [facility], [address] [place], [place] [address]    Report Status 12/09/2020 FINAL  Final  Blood culture (routine x 2)     Status: None   Collection Time: 12/04/20  9:01 AM   Specimen: BLOOD RIGHT HAND  Result Value Ref Range Status   Specimen Description BLOOD RIGHT HAND  Final   Special Requests   Final    BOTTLES DRAWN AEROBIC AND ANAEROBIC Blood Culture adequate volume   Culture   Final    NO GROWTH 5 DAYS Performed at [facility], [address], [place], [place] [address]    Report Status 12/09/2020 FINAL  Final  Expectorated Sputum Assessment w Gram Stain, Rflx to Resp Cult     Status: None   Collection Time: 12/04/20  1:41 PM   Specimen: Sputum  Result Value Ref Range Status   Specimen Description SPUTUM  Final   Special Requests NONE  Final   Sputum evaluation   Final    THIS SPECIMEN IS ACCEPTABLE FOR SPUTUM  CULTURE Performed at [facility], [address] [place], [place] [address]    Report Status 12/04/2020 FINAL  Final  Culture, Respiratory w Gram Stain     Status: None   Collection Time: 12/04/20  1:41 PM   Specimen: SPU  Result Value Ref Range Status   Specimen Description   Final    SPUTUM Performed at [facility], [address], [place], [place] [address]    Special Requests   Final    NONE Reflexed from [id number] Performed at [facility], [address] [place], [place] [address]    Gram Stain   Final    FEW SQUAMOUS EPITHELIAL CELLS PRESENT FEW WBC SEEN FEW GRAM POSITIVE COCCI    Culture   Final    RARE Normal respiratory flora-no Staph aureus or Pseudomonas seen Performed at [facility], [place] [address] [place], [place] [address]    Report Status 12/06/2020 FINAL  Final     Labs: BNP (last 3 results) Recent Labs    03/01/20 0438 12/04/20 0901 12/12/20 1154  BNP 51.4 66.0 62.9   Basic Metabolic Panel: Recent Labs  Lab 12/07/20 0419 12/08/20 0549 12/09/20 0643 12/10/20 0600 12/11/20 0549  NA 139 138 137 136 136  K 3.2* 3.7 4.3 3.8 4.0  CL 100 98 95* 97* 95*  CO2 33* 32 35* 33* 33*  GLUCOSE 130* 111* 112* 107* 208*  BUN 18 16 16 20 19   CREATININE 0.76 0.77 0.75 0.79 0.79  CALCIUM 9.5 9.4 9.8 9.6 9.6  MG 1.9 1.8 2.1  --  2.2  PHOS 3.1 2.9 3.2  --  3.3   Liver Function Tests: No results for input(s): AST, ALT, ALKPHOS, BILITOT, PROT, ALBUMIN in the last 168 hours. No results for input(s): LIPASE, AMYLASE in the last 168 hours. No results for input(s): AMMONIA in the last 168 hours. CBC: Recent Labs  Lab 12/07/20 0419 12/08/20 0549 12/09/20 0643 12/10/20 0600 12/11/20 0549  WBC 8.5 8.1 16.3* 12.4*  15.2*  HGB 12.1 12.3 13.9 12.7 13.2  HCT 36.0 36.6 42.7 38.7 38.8  MCV 90.2 90.4 89.9 89.6 89.8  PLT 240 237 362 272 298   Cardiac Enzymes: No results for input(s): CKTOTAL, CKMB, CKMBINDEX, TROPONINI in the  last 168 hours. BNP: Invalid input(s): POCBNP CBG: Recent Labs  Lab 12/12/20 0805 12/12/20 1134 12/12/20 1800 12/12/20 2053 12/13/20 0840  GLUCAP 107* 189* 356* 284* 201*   D-Dimer No results for input(s): DDIMER in the last 72 hours. Hgb A1c No results for input(s): HGBA1C in the last 72 hours. Lipid Profile No results for input(s): CHOL, HDL, LDLCALC, TRIG, CHOLHDL, LDLDIRECT in the last 72 hours. Thyroid function studies No results for input(s): TSH, T4TOTAL, T3FREE, THYROIDAB in the last 72 hours.  Invalid input(s): FREET3 Anemia work up No results for input(s): VITAMINB12, FOLATE, FERRITIN, TIBC, IRON, RETICCTPCT in the last 72 hours. Urinalysis    Component Value Date/Time   COLORURINE YELLOW 07/19/2020 1411   APPEARANCEUR Sl Cloudy (A) 07/19/2020 1411   APPEARANCEUR Cloudy (A) 01/01/2017 1403   LABSPEC 1.020 07/19/2020 1411   PHURINE 6.0 07/19/2020 1411   GLUCOSEU NEGATIVE 07/19/2020 1411   HGBUR NEGATIVE 07/19/2020 1411   HGBUR trace-lysed 12/20/2009 1107   BILIRUBINUR NEGATIVE 07/19/2020 1411   BILIRUBINUR neg 06/06/2020 1553   BILIRUBINUR Negative 01/01/2017 1403   KETONESUR TRACE (A) 07/19/2020 1411   PROTEINUR Negative 09/07/2019 1442   PROTEINUR 1+ (A) 01/01/2017 1403   PROTEINUR NEGATIVE 07/12/2010 0738   UROBILINOGEN 0.2 07/19/2020 1411   NITRITE POSITIVE (A) 07/19/2020 1411   LEUKOCYTESUR LARGE (A) 07/19/2020 1411   Sepsis Labs Invalid input(s): PROCALCITONIN,  WBC,  LACTICIDVEN Microbiology Recent Results (from the past 240 hour(s))  Resp Panel by RT-PCR (Flu A&B, Covid) Nasopharyngeal Swab     Status: None   Collection Time: 12/04/20  7:24 AM   Specimen: Nasopharyngeal Swab; Nasopharyngeal(NP) swabs in vial transport medium  Result Value Ref Range Status   SARS Coronavirus 2 by RT PCR NEGATIVE NEGATIVE Final    Comment: (NOTE) SARS-CoV-2 target nucleic acids are NOT DETECTED.  The SARS-CoV-2 RNA is generally detectable in upper  respiratory specimens during the acute phase of infection. The lowest concentration of SARS-CoV-2 viral copies this assay can detect is 138 copies/mL. A negative result does not preclude SARS-Cov-2 infection and should not be used as the sole basis for treatment or other patient management decisions. A negative result may occur with  improper specimen collection/handling, submission of specimen other than nasopharyngeal swab, presence of viral mutation(s) within the areas targeted by this assay, and inadequate number of viral copies(<138 copies/mL). A negative result must be combined with clinical observations, patient history, and epidemiological information. The expected result is Negative.  Fact Sheet for Patients:  EntrepreneurPulse.com.au  Fact Sheet for Healthcare Providers:  IncredibleEmployment.be  This test is no t yet approved or cleared by the [place] FDA and  has been authorized for detection and/or diagnosis of SARS-CoV-2 by FDA under an Emergency Use Authorization (EUA). This EUA will remain  in effect (meaning this test can be used) for the duration of the COVID-19 declaration under Section 564(b)(1) of the Act, 21 U.S.C.section 360bbb-3(b)(1), unless the authorization is terminated  or revoked sooner.       Influenza A by PCR NEGATIVE NEGATIVE Final   Influenza B by PCR NEGATIVE NEGATIVE Final    Comment: (NOTE) The Xpert Xpress SARS-CoV-2/FLU/RSV plus assay is intended as an aid in the diagnosis of influenza from Nasopharyngeal  swab specimens and should not be used as a sole basis for treatment. Nasal washings and aspirates are unacceptable for Xpert Xpress SARS-CoV-2/FLU/RSV testing.  Fact Sheet for Patients: EntrepreneurPulse.com.au  Fact Sheet for Healthcare Providers: IncredibleEmployment.be  This test is not yet approved or cleared by the [place] FDA and has been  authorized for detection and/or diagnosis of SARS-CoV-2 by FDA under an Emergency Use Authorization (EUA). This EUA will remain in effect (meaning this test can be used) for the duration of the COVID-19 declaration under Section 564(b)(1) of the Act, 21 U.S.C. section 360bbb-3(b)(1), unless the authorization is terminated or revoked.  Performed at [facility], [address] [place], [place] [address]   Blood culture (routine x 2)     Status: None   Collection Time: 12/04/20  9:01 AM   Specimen: BLOOD LEFT HAND  Result Value Ref Range Status   Specimen Description BLOOD LEFT HAND  Final   Special Requests   Final    BOTTLES DRAWN AEROBIC AND ANAEROBIC Blood Culture results may not be optimal due to an inadequate volume of blood received in culture bottles   Culture   Final    NO GROWTH 5 DAYS Performed at [facility], [address], [place], [place] [address]    Report Status 12/09/2020 FINAL  Final  Blood culture (routine x 2)     Status: None   Collection Time: 12/04/20  9:01 AM   Specimen: BLOOD RIGHT HAND  Result Value Ref Range Status   Specimen Description BLOOD RIGHT HAND  Final   Special Requests   Final    BOTTLES DRAWN AEROBIC AND ANAEROBIC Blood Culture adequate volume   Culture   Final    NO GROWTH 5 DAYS Performed at [facility], [address] [place], [place] [address]    Report Status 12/09/2020 FINAL  Final  Expectorated Sputum Assessment w Gram Stain, Rflx to Resp Cult     Status: None   Collection Time: 12/04/20  1:41 PM   Specimen: Sputum  Result Value Ref Range Status   Specimen Description SPUTUM  Final   Special Requests NONE  Final   Sputum evaluation   Final    THIS SPECIMEN IS ACCEPTABLE FOR SPUTUM CULTURE Performed at [facility], [address] [place], [place] [address]    Report Status 12/04/2020 FINAL  Final  Culture, Respiratory w Gram Stain     Status: None   Collection Time: 12/04/20  1:41  PM   Specimen: SPU  Result Value Ref Range Status   Specimen Description   Final    SPUTUM Performed at [facility], [address], [place], [place] [address]    Special Requests   Final    NONE Reflexed from [id number] Performed at [facility], [address] [place], [place] [address]    Gram Stain   Final    FEW SQUAMOUS EPITHELIAL CELLS PRESENT FEW WBC SEEN FEW GRAM POSITIVE COCCI    Culture   Final    RARE Normal respiratory flora-no Staph aureus or Pseudomonas seen Performed at [facility], [place] [address], [place],  [address]    Report Status 12/06/2020 FINAL  Final    Time coordinating discharge: Over 30 minutes  SIGNED:  Lorella Nimrod, MD  [facility] 12/13/2020, 11:28 AM  If 7PM-7AM, please contact night-coverage [email or web address]  This record has been created using Systems analyst. Errors have been sought and corrected,but may  not always be located. Such creation errors do not reflect on the standard of care.

## 2020-12-13 NOTE — Care Management Important Message (Signed)
Important Message  Patient Details  [name] MRN: [id number] Date of Birth: [birth year]   Medicare Important Message Given:  Yes     [name] 12/13/2020, 11:34 AM

## 2020-12-13 NOTE — Progress Notes (Signed)
Inpatient Diabetes Program Recommendations  AACE/ADA: New Consensus Statement on Inpatient Glycemic Control (2015)  Target Ranges:  Prepandial:   less than 140 mg/dL      Peak postprandial:   less than 180 mg/dL (1-2 hours)      Critically ill patients:  140 - 180 mg/dL   Lab Results  Component Value Date   GLUCAP 201 (H) 12/13/2020   HGBA1C 6.3 (H) 12/06/2020    Review of Glycemic Control Results for [name], Kirsten [name] (MRN [id number]) as of 12/13/2020 10:28  Ref. Range 12/12/2020 08:05 12/12/2020 11:34 12/12/2020 18:00 12/12/2020 20:53 12/13/2020 08:40  Glucose-Capillary Latest Ref Range: 70 - 99 mg/dL 107 (H) 189 (H) 356 (H) 284 (H) 201 (H)   Diabetes history: DM 2 Outpatient Diabetes medications:  Metformin 1000 mg bid  Current orders for Inpatient glycemic control:  Novolog moderate tid with meals and HS Prednisone 40 mg daily  Inpatient Diabetes Program Recommendations:    Note post-prandial blood sugars increased with Prednisone. May consider adding Novolog 3 units tid with meals (hold if patient eats less than 50% or NPO) while on steroids.    Thanks,  Adah Perl, RN, BC-ADM Inpatient Diabetes Coordinator Pager [phone]  (8a-5p)

## 2020-12-15 ENCOUNTER — Telehealth: Payer: Self-pay

## 2020-12-15 NOTE — Telephone Encounter (Signed)
Transition Care Management Follow-up Telephone Call Date of discharge and from where: 12/13/2020 from [facility] How have you been since you were released from the hospital? " Feeling okay, but a little shakey & weak" Any questions or concerns? No  Items Reviewed: Did the pt receive and understand the discharge instructions provided? Yes  Medications obtained and verified? Yes  Other? No  Any new allergies since your discharge? No  Dietary orders reviewed? Yes,  Heart Healthy / Carb Modified  Do you have support at home? Yes   Home Care and Equipment/Supplies: Were home health services ordered? no If so, what is the name of the agency? N/a  Has the agency set up a time to come to the patient's home? no Were any new equipment or medical supplies ordered?  No What is the name of the medical supply agency? no Were you able to get the supplies/equipment? not applicable Do you have any questions related to the use of the equipment or supplies? No  Functional Questionnaire: (I = Independent and D = Dependent) ADLs: I  Bathing/Dressing- I  Meal Prep- I  Eating- I  Maintaining continence- I  Transferring/Ambulation- I  Managing Meds- I  Follow up appointments reviewed:  PCP Hospital f/u appt confirmed? Yes  Scheduled to see Billey Gosling, MD on 12/20/2020 @ 10:40 am. [facility] f/u appt confirmed? No   Are transportation arrangements needed? No  If their condition worsens, is the pt aware to call PCP or go to the Emergency Dept.? Yes Was the patient provided with contact information for the PCP's office or ED? Yes Was to pt encouraged to call back with questions or concerns? Yes

## 2020-12-20 ENCOUNTER — Other Ambulatory Visit: Payer: Self-pay

## 2020-12-20 ENCOUNTER — Ambulatory Visit (INDEPENDENT_AMBULATORY_CARE_PROVIDER_SITE_OTHER): Payer: PPO | Admitting: Internal Medicine

## 2020-12-20 ENCOUNTER — Inpatient Hospital Stay: Payer: PPO | Admitting: Internal Medicine

## 2020-12-20 ENCOUNTER — Encounter: Payer: Self-pay | Admitting: Internal Medicine

## 2020-12-20 ENCOUNTER — Ambulatory Visit (INDEPENDENT_AMBULATORY_CARE_PROVIDER_SITE_OTHER): Payer: PPO

## 2020-12-20 VITALS — BP 124/76 | HR 77 | Temp 98.5°F | Ht 67.0 in | Wt 195.0 lb

## 2020-12-20 DIAGNOSIS — J90 Pleural effusion, not elsewhere classified: Secondary | ICD-10-CM | POA: Diagnosis not present

## 2020-12-20 DIAGNOSIS — E118 Type 2 diabetes mellitus with unspecified complications: Secondary | ICD-10-CM

## 2020-12-20 DIAGNOSIS — E039 Hypothyroidism, unspecified: Secondary | ICD-10-CM

## 2020-12-20 DIAGNOSIS — J4531 Mild persistent asthma with (acute) exacerbation: Secondary | ICD-10-CM | POA: Diagnosis not present

## 2020-12-20 DIAGNOSIS — G4733 Obstructive sleep apnea (adult) (pediatric): Secondary | ICD-10-CM | POA: Diagnosis not present

## 2020-12-20 DIAGNOSIS — Z0001 Encounter for general adult medical examination with abnormal findings: Secondary | ICD-10-CM | POA: Insufficient documentation

## 2020-12-20 DIAGNOSIS — Z23 Encounter for immunization: Secondary | ICD-10-CM | POA: Insufficient documentation

## 2020-12-20 DIAGNOSIS — G4730 Sleep apnea, unspecified: Secondary | ICD-10-CM

## 2020-12-20 DIAGNOSIS — E538 Deficiency of other specified B group vitamins: Secondary | ICD-10-CM | POA: Diagnosis not present

## 2020-12-20 DIAGNOSIS — E785 Hyperlipidemia, unspecified: Secondary | ICD-10-CM | POA: Diagnosis not present

## 2020-12-20 DIAGNOSIS — I480 Paroxysmal atrial fibrillation: Secondary | ICD-10-CM

## 2020-12-20 DIAGNOSIS — J189 Pneumonia, unspecified organism: Secondary | ICD-10-CM | POA: Diagnosis not present

## 2020-12-20 DIAGNOSIS — F418 Other specified anxiety disorders: Secondary | ICD-10-CM

## 2020-12-20 DIAGNOSIS — F409 Phobic anxiety disorder, unspecified: Secondary | ICD-10-CM

## 2020-12-20 DIAGNOSIS — F5105 Insomnia due to other mental disorder: Secondary | ICD-10-CM

## 2020-12-20 DIAGNOSIS — J811 Chronic pulmonary edema: Secondary | ICD-10-CM | POA: Diagnosis not present

## 2020-12-20 DIAGNOSIS — G4700 Insomnia, unspecified: Secondary | ICD-10-CM | POA: Diagnosis not present

## 2020-12-20 DIAGNOSIS — J9811 Atelectasis: Secondary | ICD-10-CM | POA: Diagnosis not present

## 2020-12-20 MED ORDER — METOPROLOL SUCCINATE ER 50 MG PO TB24: 50.0000 mg | ORAL_TABLET | Freq: Every day | ORAL | 1 refills | Status: DC

## 2020-12-20 MED ORDER — BOOSTRIX 5-2.5-18.5 LF-MCG/0.5 IM SUSP: 0.5000 mL | Freq: Once | INTRAMUSCULAR | 0 refills | Status: AC

## 2020-12-20 MED ORDER — APIXABAN 5 MG PO TABS: 5.0000 mg | ORAL_TABLET | Freq: Two times a day (BID) | ORAL | 1 refills | Status: DC

## 2020-12-20 MED ORDER — ATORVASTATIN CALCIUM 40 MG PO TABS: ORAL_TABLET | ORAL | 1 refills | Status: DC

## 2020-12-20 MED ORDER — FLUTICASONE-UMECLIDIN-VILANT 100-62.5-25 MCG/ACT IN AEPB: 1.0000 | INHALATION_SPRAY | Freq: Every day | RESPIRATORY_TRACT | 1 refills | Status: DC

## 2020-12-20 MED ORDER — ZOLPIDEM TARTRATE 10 MG PO TABS: 10.0000 mg | ORAL_TABLET | Freq: Every evening | ORAL | 0 refills | Status: DC | PRN

## 2020-12-20 MED ORDER — PAROXETINE HCL ER 25 MG PO TB24: ORAL_TABLET | ORAL | 1 refills | Status: DC

## 2020-12-20 MED ORDER — METFORMIN HCL 1000 MG PO TABS
ORAL_TABLET | ORAL | 1 refills | Status: DC
Start: 2020-12-20 — End: 2021-07-10

## 2020-12-20 MED ORDER — LEVOTHYROXINE SODIUM 150 MCG PO TABS: 150.0000 ug | ORAL_TABLET | Freq: Every day | ORAL | 0 refills | Status: DC

## 2020-12-20 MED ORDER — CYANOCOBALAMIN 1000 MCG/ML IJ SOLN
1000.0000 ug | Freq: Once | INTRAMUSCULAR | Status: AC
  Administered 2020-12-20: 1000 ug via INTRAMUSCULAR

## 2020-12-20 NOTE — Progress Notes (Signed)
Subjective:  Patient ID: Kirsten [name], female    DOB: Nov 28, 1955  Age: 65 y.o. MRN: [id number]  CC: Annual Exam  This visit occurred during the SARS-CoV-2 public health emergency.  Safety protocols were in place, including screening questions prior to the visit, additional usage of staff PPE, and extensive cleaning of exam room while observing appropriate contact time as indicated for disinfecting solutions.    HPI Kirsten [name] presents for a CPX and f/up -  She was recently admitted for respiratory symptoms.  Her chest x-ray indicated something suspicious in the right lower lobe.  She was treated with intravenous and then oral antibiotics.  She had allergic reaction to doxycycline.  She feels much better.  She denies cough, shortness of breath, wheezing, fever, chills, night sweats, or hemoptysis.  Recommendations for Outpatient Follow-up:  Follow up with PCP in 1-2 weeks Please obtain BMP/CBC in one week Please follow up on the following pending results: None   Home Health: No Equipment/Devices: None Discharge Condition: Stable CODE STATUS: Full Diet recommendation: Heart Healthy / Carb Modified    Brief/Interim Summary: Kirsten [name] is a 65 y.o. female with medical history significant of hypertension, hyperlipidemia, diabetes mellitus, asthma, GERD, OSA on CPAP, hypothyroidism, pulmonary fibrosis, IBS, atrial fibrillation on Eliquis, s/p of gastroschisis repair, who presents with shortness of breath. She was admitted for community-acquired pneumonia.  Initially hypoxic requiring supplemental oxygen which was later weaned off, currently saturating well on room air. Acute respiratory failure with hypoxia most likely secondary to community-acquired pneumonia which also resulted in asthma exacerbation in the setting of pulmonary fibrosis. Patient completed a course of antibiotics and steroids. Sputum and blood cultures negative, urinary antigen for strep and Legionella  negative. Patient can continue her home medications and follow-up with her pulmonologist for further recommendations.   Patient develops a rash which was thought to be secondary to doxycycline.  Improved with Benadryl and discontinuation of doxycycline.   Patient also had acute diastolic heart failure during current hospitalization.  Echocardiogram was done with complaint of lower extremity edema.  BNP was within normal range.  Echocardiogram with normal EF and grade 2 diastolic dysfunction.  Mildly dilated right ventricle.  Patient will continue home dose of HCTZ and spironolactone and follow-up with cardiology as an outpatient.   Patient will continue rest of her home medications and follow-up with her providers.   Discharge Diagnoses:  Principal Problem:   CAP (community acquired pneumonia) Active Problems:   Hypothyroidism   Depression with anxiety   OSA on CPAP   Paroxysmal atrial fibrillation (HCC)   Pulmonary fibrosis (HCC)   Diabetes mellitus without complication (HCC)   Asthma exacerbation   Acute respiratory failure with hypoxia (HCC)   Hyperlipidemia   Hypokalemia  Outpatient Medications Prior to Visit  Medication Sig Dispense Refill   albuterol (VENTOLIN HFA) 108 (90 Base) MCG/ACT inhaler Inhale 2 puffs into the lungs every 6 (six) hours as needed for wheezing or shortness of breath. 18 g 2   amiodarone (PACERONE) 200 MG tablet Take 1 tablet (200 mg total) by mouth daily. 90 tablet 1   Continuous Blood Gluc Receiver (FREESTYLE LIBRE 2 READER) DEVI 1 Act by Does not apply route daily. 2 each 5   Continuous Blood Gluc Sensor (FREESTYLE LIBRE 2 SENSOR) MISC 1 Act by Does not apply route daily. 2 each 5   dextromethorphan-guaiFENesin (MUCINEX DM) 30-600 MG 12hr tablet Take 1 tablet by mouth 2 (two) times daily as needed for cough.  30 tablet 0   hydrochlorothiazide (HYDRODIURIL) 25 MG tablet Take 1 tablet by mouth daily.     spironolactone (ALDACTONE) 25 MG tablet Take 12.5 mg  by mouth daily.     apixaban (ELIQUIS) 5 MG TABS tablet Take 1 tablet (5 mg total) by mouth 2 (two) times daily. 180 tablet 1   atorvastatin (LIPITOR) 40 MG tablet TAKE 1 TABLET(40 MG) BY MOUTH DAILY 90 tablet 1   Fluticasone-Umeclidin-Vilant (TRELEGY ELLIPTA) 100-62.5-25 MCG/INH AEPB Inhale 1 puff into the lungs daily. 120 each 1   levothyroxine (SYNTHROID) 150 MCG tablet Take 1 tablet (150 mcg total) by mouth daily. 90 tablet 0   metFORMIN (GLUCOPHAGE) 1000 MG tablet One po BID 180 tablet 1   metoprolol succinate (TOPROL-XL) 50 MG 24 hr tablet Take 1 tablet (50 mg total) by mouth daily. 90 tablet 1   PARoxetine (PAXIL-CR) 25 MG 24 hr tablet TAKE 1 TABLET(25 MG) BY MOUTH DAILY 90 tablet 1   zolpidem (AMBIEN) 10 MG tablet TAKE ONE TABLET BY MOUTH ONE TIME DAILY (Patient taking differently: 10 mg at bedtime as needed for sleep.) 90 tablet 0   No facility-administered medications prior to visit.    ROS Review of Systems  Constitutional:  Negative for chills, diaphoresis, fatigue and fever.  HENT: Negative.    Eyes: Negative.   Respiratory:  Negative for cough, chest tightness, shortness of breath and wheezing.   Cardiovascular:  Negative for chest pain, palpitations and leg swelling.  Gastrointestinal:  Negative for abdominal pain, constipation, diarrhea, nausea and vomiting.  Endocrine: Negative.   Genitourinary: Negative.  Negative for difficulty urinating.  Musculoskeletal: Negative.  Negative for arthralgias and myalgias.  Skin: Negative.   Neurological:  Negative for dizziness, weakness, light-headedness and headaches.  Hematological:  Negative for adenopathy. Does not bruise/bleed easily.  Psychiatric/Behavioral:  Positive for sleep disturbance. Negative for decreased concentration, dysphoric mood and suicidal ideas. The patient is nervous/anxious.    Objective:  BP 124/76 (BP Location: Right Arm, Patient Position: Sitting, Cuff Size: Large)   Pulse 77   Temp 98.5 F (36.9 C)  (Oral)   Ht 5' 7"  (1.702 m)   Wt 195 lb (88.5 kg)   SpO2 98%   BMI 30.54 kg/m   BP Readings from Last 3 Encounters:  12/20/20 124/76  12/13/20 128/80  10/28/20 128/80    Wt Readings from Last 3 Encounters:  12/20/20 195 lb (88.5 kg)  12/04/20 200 lb (90.7 kg)  10/28/20 204 lb 6.4 oz (92.7 kg)    Physical Exam Vitals reviewed.  Constitutional:      General: She is not in acute distress.    Appearance: She is not ill-appearing, toxic-appearing or diaphoretic.  HENT:     Nose: Nose normal.     Mouth/Throat:     Mouth: Mucous membranes are moist.  Eyes:     General: No scleral icterus.    Conjunctiva/sclera: Conjunctivae normal.  Cardiovascular:     Rate and Rhythm: Normal rate and regular rhythm.     Heart sounds: No murmur heard. Pulmonary:     Effort: Pulmonary effort is normal.     Breath sounds: No stridor. No wheezing, rhonchi or rales.  Abdominal:     General: Abdomen is protuberant. Bowel sounds are normal. There is no distension.     Palpations: Abdomen is soft. There is no fluid wave, hepatomegaly, splenomegaly or mass.     Tenderness: There is no abdominal tenderness. There is no guarding.  Musculoskeletal:  General: Normal range of motion.     Cervical back: Neck supple.     Right lower leg: No edema.     Left lower leg: No edema.  Lymphadenopathy:     Cervical: No cervical adenopathy.  Skin:    General: Skin is warm and dry.  Neurological:     General: No focal deficit present.     Mental Status: She is alert and oriented to person, place, and time.  Psychiatric:        Mood and Affect: Mood normal.        Behavior: Behavior normal.    Lab Results  Component Value Date   WBC 15.2 (H) 12/11/2020   HGB 13.2 12/11/2020   HCT 38.8 12/11/2020   PLT 298 12/11/2020   GLUCOSE 208 (H) 12/11/2020   CHOL 120 07/19/2020   TRIG 131.0 07/19/2020   HDL 45.00 07/19/2020   LDLDIRECT 63.0 01/15/2019   LDLCALC 48 07/19/2020   ALT 49 (H) 10/18/2020    AST 53 (H) 10/18/2020   NA 136 12/11/2020   K 4.0 12/11/2020   CL 95 (L) 12/11/2020   CREATININE 0.79 12/11/2020   BUN 19 12/11/2020   CO2 33 (H) 12/11/2020   TSH 6.37 (H) 10/18/2020   INR 1.4 (H) 06/02/2019   HGBA1C 6.3 (H) 12/06/2020   MICROALBUR 2.0 (H) 11/16/2019    DG Chest Port 1 View  Result Date: 12/12/2020 CLINICAL DATA:  Shortness of breath. EXAM: PORTABLE CHEST 1 VIEW COMPARISON:  December 09, 2020. FINDINGS: Stable cardiomegaly with possible central pulmonary vascular congestion. No pneumothorax is noted. Left lung is clear. Mild right basilar subsegmental atelectasis is noted with possible small pleural effusion. Bony thorax is unremarkable. IMPRESSION: Stable cardiomegaly with mild central pulmonary vascular congestion. Mild right basilar subsegmental atelectasis is noted with possible small right pleural effusion. Electronically Signed   By: Marijo Conception M.D.   On: 12/12/2020 11:06   ECHOCARDIOGRAM COMPLETE  Result Date: 12/12/2020    ECHOCARDIOGRAM REPORT   Patient Name:   CHESNEE FLOREN Date of Exam: 12/12/2020 Medical Rec #:  [id number]        Height:       67.0 in Accession #:    [id number]       Weight:       200.0 lb Date of Birth:  [birth year]        BSA:          2.022 m Patient Age:    76 years         BP:           120/66 mmHg Patient Gender: F                HR:           63 bpm. Exam Location:  [facility] Procedure: 2D Echo, Cardiac Doppler and Color Doppler Indications:     SBE I33.9  History:         Patient has no prior history of Echocardiogram examinations.                  Arrythmias:Atrial Fibrillation; Risk Factors:Sleep Apnea and                  Hypertension. Pulmonary fibrosis.  Sonographer:     Sherrie Sport Referring Phys:  [id number] Val Riles Diagnosing Phys: Nelva Bush MD  Sonographer Comments: Technically difficult study due to poor echo windows. Pt has pulmonary fibrosis. IMPRESSIONS  1. Left  ventricular ejection fraction, by estimation, is >55%. The  left ventricle has normal function. Left ventricular endocardial border not optimally defined to evaluate regional wall motion. There is mild left ventricular hypertrophy. Left ventricular diastolic parameters are consistent with Grade II diastolic dysfunction (pseudonormalization). Elevated left atrial pressure.  2. Right ventricular systolic function was not well visualized. The right ventricular size is mildly enlarged. Tricuspid regurgitation signal is inadequate for assessing PA pressure.  3. Left atrial size was mildly dilated.  4. The mitral valve was not well visualized. Trivial mitral valve regurgitation. No evidence of mitral stenosis.  5. The aortic valve was not well visualized. Aortic valve regurgitation is not visualized. No aortic stenosis is present. FINDINGS  Left Ventricle: Left ventricular ejection fraction, by estimation, is >55%. The left ventricle has normal function. Left ventricular endocardial border not optimally defined to evaluate regional wall motion. The left ventricular internal cavity size was  normal in size. There is mild left ventricular hypertrophy. Left ventricular diastolic parameters are consistent with Grade II diastolic dysfunction (pseudonormalization). Elevated left atrial pressure. Right Ventricle: The right ventricular size is mildly enlarged. Right vetricular wall thickness was not well visualized. Right ventricular systolic function was not well visualized. Tricuspid regurgitation signal is inadequate for assessing PA pressure. Left Atrium: Left atrial size was mildly dilated. Right Atrium: Right atrial size was normal in size. Pericardium: The pericardium was not well visualized. Mitral Valve: The mitral valve was not well visualized. Trivial mitral valve regurgitation. No evidence of mitral valve stenosis. MV peak gradient, 3.8 mmHg. The mean mitral valve gradient is 2.0 mmHg. Tricuspid Valve: The tricuspid valve is not well visualized. Tricuspid valve regurgitation  is trivial. Aortic Valve: The aortic valve was not well visualized. Aortic valve regurgitation is not visualized. No aortic stenosis is present. Aortic valve mean gradient measures 2.0 mmHg. Aortic valve peak gradient measures 4.7 mmHg. Aortic valve area, by VTI measures 2.89 cm. Pulmonic Valve: The pulmonic valve was not well visualized. Pulmonic valve regurgitation is not visualized. No evidence of pulmonic stenosis. Aorta: The aortic root is normal in size and structure. Pulmonary Artery: The pulmonary artery is not well seen. Venous: The inferior vena cava was not well visualized. IAS/Shunts: The interatrial septum was not well visualized.  LEFT VENTRICLE PLAX 2D LVIDd:         4.20 cm   Diastology LVIDs:         3.00 cm   LV e' medial:    5.11 cm/s LV PW:         0.95 cm   LV E/e' medial:  17.7 LV IVS:        1.17 cm   LV e' lateral:   9.36 cm/s LVOT diam:     2.10 cm   LV E/e' lateral: 9.7 LV SV:         56 LV SV Index:   28 LVOT Area:     3.46 cm  RIGHT VENTRICLE RV Basal diam:  4.50 cm RV S prime:     11.70 cm/s LEFT ATRIUM           Index        RIGHT ATRIUM           Index LA diam:      3.80 cm 1.88 cm/m   RA Area:     17.50 cm LA Vol (A4C): 65.9 ml 32.59 ml/m  RA Volume:   44.00 ml  21.76 ml/m  AORTIC VALVE  PULMONIC VALVE AV Area (Vmax):    2.81 cm     PV Vmax:        0.74 m/s AV Area (Vmean):   2.90 cm     PV Vmean:       48.500 cm/s AV Area (VTI):     2.89 cm     PV VTI:         0.124 m AV Vmax:           108.00 cm/s  PV Peak grad:   2.2 mmHg AV Vmean:          61.100 cm/s  PV Mean grad:   1.0 mmHg AV VTI:            0.193 m      RVOT Peak grad: 4 mmHg AV Peak Grad:      4.7 mmHg AV Mean Grad:      2.0 mmHg LVOT Vmax:         87.50 cm/s LVOT Vmean:        51.100 cm/s LVOT VTI:          0.161 m LVOT/AV VTI ratio: 0.83  AORTA Ao Root diam: 3.30 cm MITRAL VALVE MV Area (PHT): 4.26 cm    SHUNTS MV Area VTI:   2.00 cm    Systemic VTI:  0.16 m MV Peak grad:  3.8 mmHg    Systemic  Diam: 2.10 cm MV Mean grad:  2.0 mmHg    Pulmonic VTI:  0.125 m MV Vmax:       0.98 m/s MV Vmean:      60.0 cm/s MV Decel Time: 178 msec MV E velocity: 90.40 cm/s MV A velocity: 78.40 cm/s MV E/A ratio:  1.15 Harrell Gave End MD Electronically signed by Nelva Bush MD Signature Date/Time: 12/12/2020/2:45:55 PM    Final    DG Chest 2 View  Result Date: 12/20/2020 CLINICAL DATA:  Right pleural effusion EXAM: CHEST - 2 VIEW COMPARISON:  Radiograph 12/12/2020 FINDINGS: Unchanged cardiomediastinal silhouette with mild central pulmonary vascular congestion. There is a small right pleural effusion adjacent right basilar opacities. Is not significantly changed from prior. Left basilar linear atelectasis. No visible pneumothorax. Bones are unchanged. IMPRESSION: Unchanged small right pleural effusion and adjacent right basilar opacities which could be atelectasis or infection. Electronically Signed   By: Maurine Simmering M.D.   On: 12/20/2020 11:06     Assessment & Plan:   Rainah was seen today for annual exam.  Diagnoses and all orders for this visit:  Pleural effusion, right- The effusion is unchanged on plain film.  There is concern about persistent right basilar opacities.  I recommended that she undergo a CT with contrast to see if there is persistent infection or malignancy. -     DG Chest 2 View; Future -     CT Chest W Contrast; Future  Hyperlipidemia with target LDL less than 100- LDL goal achieved. Doing well on the statin  -     atorvastatin (LIPITOR) 40 MG tablet; TAKE 1 TABLET(40 MG) BY MOUTH DAILY  Paroxysmal atrial fibrillation ([place])- She has good rate and rhythm control. -     apixaban (ELIQUIS) 5 MG TABS tablet; Take 1 tablet (5 mg total) by mouth 2 (two) times daily. -     metoprolol succinate (TOPROL-XL) 50 MG 24 hr tablet; Take 1 tablet (50 mg total) by mouth daily.  Mild persistent asthma with acute exacerbation -     Fluticasone-Umeclidin-Vilant (TRELEGY ELLIPTA) 100-62.5-25  MCG/ACT AEPB; Inhale 1 puff into the lungs daily.  Hypothyroidism, unspecified type- Her recent TSH was in the acceptable range.. -     levothyroxine (SYNTHROID) 150 MCG tablet; Take 1 tablet (150 mcg total) by mouth daily.  Type II diabetes mellitus with manifestations (HCC)-her blood sugar is adequately well controlled. -     metFORMIN (GLUCOPHAGE) 1000 MG tablet; One po BID -     HM Diabetes Foot Exam  Type 2 diabetes mellitus with complication, without long-term current use of insulin (HCC) -     metFORMIN (GLUCOPHAGE) 1000 MG tablet; One po BID -     HM Diabetes Foot Exam  Depression with anxiety -     PARoxetine (PAXIL-CR) 25 MG 24 hr tablet; TAKE 1 TABLET(25 MG) BY MOUTH DAILY  Insomnia w/ sleep apnea -     zolpidem (AMBIEN) 10 MG tablet; Take 1 tablet (10 mg total) by mouth at bedtime as needed for sleep.  Insomnia due to anxiety and fear -     zolpidem (AMBIEN) 10 MG tablet; Take 1 tablet (10 mg total) by mouth at bedtime as needed for sleep.  Vitamin B 12 deficiency -     cyanocobalamin ((VITAMIN B-12)) injection 1,000 mcg  Need for vaccination -     Pneumococcal conjugate vaccine 20-valent  Pneumonia of right lower lobe due to infectious organism -     CT Chest W Contrast; Future  Need for prophylactic vaccination with combined diphtheria-tetanus-pertussis (DTP) vaccine -     Tdap (BOOSTRIX) 5-2.5-18.5 LF-MCG/0.5 injection; Inject 0.5 mLs into the muscle once for 1 dose.  Other orders -     Flu Vaccine QUAD 6+ mos PF IM (Fluarix Quad PF)  I have changed Saniyyah A. Louvier's Trelegy Ellipta to Fluticasone-Umeclidin-Vilant. I have also changed her zolpidem. I am also having her start on Boostrix. Additionally, I am having her maintain her albuterol, amiodarone, hydrochlorothiazide, FreeStyle Libre 2 Sensor, [employer] 2 Reader, spironolactone, dextromethorphan-guaiFENesin, atorvastatin, apixaban, levothyroxine, metFORMIN, metoprolol succinate, and PARoxetine. We  administered cyanocobalamin.  Meds ordered this encounter  Medications   atorvastatin (LIPITOR) 40 MG tablet    Sig: TAKE 1 TABLET(40 MG) BY MOUTH DAILY    Dispense:  90 tablet    Refill:  1   apixaban (ELIQUIS) 5 MG TABS tablet    Sig: Take 1 tablet (5 mg total) by mouth 2 (two) times daily.    Dispense:  180 tablet    Refill:  1   Fluticasone-Umeclidin-Vilant (TRELEGY ELLIPTA) 100-62.5-25 MCG/ACT AEPB    Sig: Inhale 1 puff into the lungs daily.    Dispense:  120 each    Refill:  1   levothyroxine (SYNTHROID) 150 MCG tablet    Sig: Take 1 tablet (150 mcg total) by mouth daily.    Dispense:  90 tablet    Refill:  0   metFORMIN (GLUCOPHAGE) 1000 MG tablet    Sig: One po BID    Dispense:  180 tablet    Refill:  1   metoprolol succinate (TOPROL-XL) 50 MG 24 hr tablet    Sig: Take 1 tablet (50 mg total) by mouth daily.    Dispense:  90 tablet    Refill:  1   PARoxetine (PAXIL-CR) 25 MG 24 hr tablet    Sig: TAKE 1 TABLET(25 MG) BY MOUTH DAILY    Dispense:  90 tablet    Refill:  1   zolpidem (AMBIEN) 10 MG tablet    Sig: Take 1 tablet (10  mg total) by mouth at bedtime as needed for sleep.    Dispense:  90 tablet    Refill:  0   cyanocobalamin ((VITAMIN B-12)) injection 1,000 mcg   Tdap (BOOSTRIX) 5-2.5-18.5 LF-MCG/0.5 injection    Sig: Inject 0.5 mLs into the muscle once for 1 dose.    Dispense:  0.5 mL    Refill:  0     Follow-up: No follow-ups on file.  [name], MD

## 2020-12-20 NOTE — Patient Instructions (Signed)

## 2020-12-21 ENCOUNTER — Other Ambulatory Visit: Payer: Self-pay | Admitting: Internal Medicine

## 2020-12-21 DIAGNOSIS — I10 Essential (primary) hypertension: Secondary | ICD-10-CM

## 2020-12-21 NOTE — Assessment & Plan Note (Signed)
Exam completed Labs reviewed Vaccines reviewed and updated Cancer screenings are UTD Patient education was given.

## 2020-12-22 ENCOUNTER — Telehealth: Payer: Self-pay | Admitting: Internal Medicine

## 2020-12-22 ENCOUNTER — Other Ambulatory Visit: Payer: Self-pay | Admitting: Internal Medicine

## 2020-12-22 DIAGNOSIS — I10 Essential (primary) hypertension: Secondary | ICD-10-CM

## 2020-12-22 NOTE — Telephone Encounter (Signed)
Pt. Called and is requesting to have chest x-ray completed at [facility]. Would like a callback to follow up.   Callback #- [phone]

## 2020-12-22 NOTE — Telephone Encounter (Signed)
Left patient message to verify if she is wanting to change location of CT scan.

## 2020-12-23 NOTE — Telephone Encounter (Signed)
Left patient vcml to advise of update CT scan date/time/ location

## 2020-12-29 DIAGNOSIS — I2510 Atherosclerotic heart disease of native coronary artery without angina pectoris: Secondary | ICD-10-CM | POA: Diagnosis not present

## 2020-12-29 DIAGNOSIS — I4891 Unspecified atrial fibrillation: Secondary | ICD-10-CM | POA: Diagnosis not present

## 2020-12-29 DIAGNOSIS — R0602 Shortness of breath: Secondary | ICD-10-CM | POA: Diagnosis not present

## 2020-12-29 DIAGNOSIS — I10 Essential (primary) hypertension: Secondary | ICD-10-CM | POA: Diagnosis not present

## 2020-12-29 DIAGNOSIS — A481 Legionnaires' disease: Secondary | ICD-10-CM | POA: Diagnosis not present

## 2021-01-03 ENCOUNTER — Other Ambulatory Visit: Payer: PPO

## 2021-01-09 ENCOUNTER — Encounter: Payer: Self-pay | Admitting: Internal Medicine

## 2021-01-09 ENCOUNTER — Ambulatory Visit: Payer: PPO | Admitting: Internal Medicine

## 2021-01-09 ENCOUNTER — Other Ambulatory Visit: Payer: Self-pay

## 2021-01-09 ENCOUNTER — Other Ambulatory Visit (INDEPENDENT_AMBULATORY_CARE_PROVIDER_SITE_OTHER): Payer: PPO

## 2021-01-09 ENCOUNTER — Ambulatory Visit (INDEPENDENT_AMBULATORY_CARE_PROVIDER_SITE_OTHER): Payer: PPO | Admitting: Internal Medicine

## 2021-01-09 ENCOUNTER — Ambulatory Visit (INDEPENDENT_AMBULATORY_CARE_PROVIDER_SITE_OTHER): Payer: PPO

## 2021-01-09 VITALS — BP 136/88 | HR 70 | Temp 98.2°F | Ht 67.0 in | Wt 201.0 lb

## 2021-01-09 DIAGNOSIS — R059 Cough, unspecified: Secondary | ICD-10-CM | POA: Diagnosis not present

## 2021-01-09 DIAGNOSIS — J22 Unspecified acute lower respiratory infection: Secondary | ICD-10-CM

## 2021-01-09 DIAGNOSIS — J4531 Mild persistent asthma with (acute) exacerbation: Secondary | ICD-10-CM | POA: Diagnosis not present

## 2021-01-09 DIAGNOSIS — I10 Essential (primary) hypertension: Secondary | ICD-10-CM | POA: Diagnosis not present

## 2021-01-09 DIAGNOSIS — I4891 Unspecified atrial fibrillation: Secondary | ICD-10-CM | POA: Diagnosis not present

## 2021-01-09 DIAGNOSIS — R52 Pain, unspecified: Secondary | ICD-10-CM

## 2021-01-09 DIAGNOSIS — J90 Pleural effusion, not elsewhere classified: Secondary | ICD-10-CM

## 2021-01-09 DIAGNOSIS — J189 Pneumonia, unspecified organism: Secondary | ICD-10-CM

## 2021-01-09 LAB — BASIC METABOLIC PANEL
BUN: 11 mg/dL (ref 6–23)
CO2: 32 mEq/L (ref 19–32)
Calcium: 9.9 mg/dL (ref 8.4–10.5)
Chloride: 98 mEq/L (ref 96–112)
Creatinine, Ser: 0.78 mg/dL (ref 0.40–1.20)
GFR: 80.82 mL/min (ref >60.00)
Glucose, Bld: 153 mg/dL — ABNORMAL HIGH (ref 70–99)
Potassium: 3.7 mEq/L (ref 3.5–5.1)
Sodium: 138 mEq/L (ref 135–145)

## 2021-01-09 MED ORDER — HYDROCODONE BIT-HOMATROP MBR 5-1.5 MG/5ML PO SOLN
5.0000 mL | Freq: Four times a day (QID) | ORAL | 0 refills | Status: AC | PRN
Start: 2021-01-09 — End: 2021-01-19

## 2021-01-09 MED ORDER — ALBUTEROL SULFATE HFA 108 (90 BASE) MCG/ACT IN AERS: 2.0000 | INHALATION_SPRAY | Freq: Four times a day (QID) | RESPIRATORY_TRACT | 2 refills | Status: DC | PRN

## 2021-01-09 NOTE — Progress Notes (Signed)
Subjective:  Patient ID: Kirsten [name], female    DOB: 11/07/1955  Age: 65 y.o. MRN: [id number]  CC: Cough  This visit occurred during the SARS-CoV-2 public health emergency.  Safety protocols were in place, including screening questions prior to the visit, additional usage of staff PPE, and extensive cleaning of exam room while observing appropriate contact time as indicated for disinfecting solutions.    HPI [name] presents for f/up-   She complains of a 1 week history of sore throat and nonproductive cough.  Outpatient Medications Prior to Visit  Medication Sig Dispense Refill   amiodarone (PACERONE) 200 MG tablet Take 1 tablet (200 mg total) by mouth daily. 90 tablet 1   apixaban (ELIQUIS) 5 MG TABS tablet Take 1 tablet (5 mg total) by mouth 2 (two) times daily. 180 tablet 1   atorvastatin (LIPITOR) 40 MG tablet TAKE 1 TABLET(40 MG) BY MOUTH DAILY 90 tablet 1   Continuous Blood Gluc Receiver (FREESTYLE LIBRE 2 READER) DEVI 1 Act by Does not apply route daily. 2 each 5   Continuous Blood Gluc Sensor (FREESTYLE LIBRE 2 SENSOR) MISC 1 Act by Does not apply route daily. 2 each 5   dextromethorphan-guaiFENesin (MUCINEX DM) 30-600 MG 12hr tablet Take 1 tablet by mouth 2 (two) times daily as needed for cough. 30 tablet 0   Fluticasone-Umeclidin-Vilant (TRELEGY ELLIPTA) 100-62.5-25 MCG/ACT AEPB Inhale 1 puff into the lungs daily. 120 each 1   hydrochlorothiazide (HYDRODIURIL) 25 MG tablet Take 1 tablet by mouth daily.     levothyroxine (SYNTHROID) 150 MCG tablet Take 1 tablet (150 mcg total) by mouth daily. 90 tablet 0   metFORMIN (GLUCOPHAGE) 1000 MG tablet One po BID 180 tablet 1   metoprolol succinate (TOPROL-XL) 50 MG 24 hr tablet Take 1 tablet (50 mg total) by mouth daily. 90 tablet 1   PARoxetine (PAXIL-CR) 25 MG 24 hr tablet TAKE 1 TABLET(25 MG) BY MOUTH DAILY 90 tablet 1   spironolactone (ALDACTONE) 25 MG tablet Take 12.5 mg by mouth daily.     zolpidem (AMBIEN) 10 MG  tablet Take 1 tablet (10 mg total) by mouth at bedtime as needed for sleep. 90 tablet 0   albuterol (VENTOLIN HFA) 108 (90 Base) MCG/ACT inhaler Inhale 2 puffs into the lungs every 6 (six) hours as needed for wheezing or shortness of breath. 18 g 2   No facility-administered medications prior to visit.    ROS Review of Systems  Constitutional:  Negative for chills, diaphoresis, fatigue and fever.  HENT: Negative.  Negative for sinus pressure, sore throat, trouble swallowing and voice change.   Eyes: Negative.   Respiratory:  Positive for cough. Negative for chest tightness, shortness of breath and wheezing.   Cardiovascular:  Negative for chest pain, palpitations and leg swelling.  Gastrointestinal:  Negative for abdominal pain, diarrhea, nausea and vomiting.  Endocrine: Negative.   Genitourinary: Negative.  Negative for difficulty urinating.  Musculoskeletal: Negative.  Negative for myalgias.  Skin: Negative.  Negative for rash.  Neurological:  Negative for dizziness, weakness and light-headedness.  Hematological:  Negative for adenopathy. Does not bruise/bleed easily.  Psychiatric/Behavioral: Negative.     Objective:  BP 136/88 (BP Location: Left Arm, Patient Position: Sitting, Cuff Size: Large)   Pulse 70   Temp 98.2 F (36.8 C) (Oral)   Ht 5' 7"  (1.702 m)   Wt 201 lb (91.2 kg)   SpO2 90%   BMI 31.48 kg/m   BP Readings from Last 3 Encounters:  01/09/21 136/88  12/20/20 124/76  12/13/20 128/80    Wt Readings from Last 3 Encounters:  01/09/21 201 lb (91.2 kg)  12/20/20 195 lb (88.5 kg)  12/04/20 200 lb (90.7 kg)    Physical Exam Vitals reviewed.  Constitutional:      General: She is not in acute distress.    Appearance: Normal appearance. She is not ill-appearing, toxic-appearing or diaphoretic.  HENT:     Right Ear: Tympanic membrane and ear canal normal.     Left Ear: Tympanic membrane and ear canal normal.     Nose: Nose normal.     Mouth/Throat:     Mouth:  Mucous membranes are moist.  Eyes:     General: No scleral icterus.    Conjunctiva/sclera: Conjunctivae normal.  Cardiovascular:     Rate and Rhythm: Normal rate and regular rhythm.     Heart sounds: No murmur heard. Pulmonary:     Effort: Pulmonary effort is normal.     Breath sounds: No stridor. No wheezing, rhonchi or rales.  Abdominal:     General: Abdomen is flat.     Palpations: There is no mass.     Tenderness: There is no abdominal tenderness. There is no guarding.     Hernia: No hernia is present.  Musculoskeletal:        General: Normal range of motion.     Cervical back: Neck supple.     Right lower leg: No edema.     Left lower leg: No edema.  Lymphadenopathy:     Cervical: No cervical adenopathy.  Skin:    General: Skin is warm and dry.     Findings: No rash.  Neurological:     General: No focal deficit present.     Mental Status: She is alert.  Psychiatric:        Mood and Affect: Mood normal.        Behavior: Behavior normal.    Lab Results  Component Value Date   WBC 15.2 (H) 12/11/2020   HGB 13.2 12/11/2020   HCT 38.8 12/11/2020   PLT 298 12/11/2020   GLUCOSE 153 (H) 01/09/2021   CHOL 120 07/19/2020   TRIG 131.0 07/19/2020   HDL 45.00 07/19/2020   LDLDIRECT 63.0 01/15/2019   LDLCALC 48 07/19/2020   ALT 49 (H) 10/18/2020   AST 53 (H) 10/18/2020   NA 138 01/09/2021   K 3.7 01/09/2021   CL 98 01/09/2021   CREATININE 0.78 01/09/2021   BUN 11 01/09/2021   CO2 32 01/09/2021   TSH 6.37 (H) 10/18/2020   INR 1.4 (H) 06/02/2019   HGBA1C 6.3 (H) 12/06/2020   MICROALBUR 2.0 (H) 11/16/2019    DG Chest Port 1 View  Result Date: 12/12/2020 CLINICAL DATA:  Shortness of breath. EXAM: PORTABLE CHEST 1 VIEW COMPARISON:  December 09, 2020. FINDINGS: Stable cardiomegaly with possible central pulmonary vascular congestion. No pneumothorax is noted. Left lung is clear. Mild right basilar subsegmental atelectasis is noted with possible small pleural effusion.  Bony thorax is unremarkable. IMPRESSION: Stable cardiomegaly with mild central pulmonary vascular congestion. Mild right basilar subsegmental atelectasis is noted with possible small right pleural effusion. Electronically Signed   By: Marijo Conception M.D.   On: 12/12/2020 11:06   ECHOCARDIOGRAM COMPLETE  Result Date: 12/12/2020    ECHOCARDIOGRAM REPORT   Patient Name:   VASILIA DISE Date of Exam: 12/12/2020 Medical Rec #:  [id number]        Height:  67.0 in Accession #:    [id number]       Weight:       200.0 lb Date of Birth:  04-14-1955        BSA:          2.022 m Patient Age:    49 years         BP:           120/66 mmHg Patient Gender: F                HR:           63 bpm. Exam Location:  [facility] Procedure: 2D Echo, Cardiac Doppler and Color Doppler Indications:     SBE I33.9  History:         Patient has no prior history of Echocardiogram examinations.                  Arrythmias:Atrial Fibrillation; Risk Factors:Sleep Apnea and                  Hypertension. Pulmonary fibrosis.  Sonographer:     Sherrie Sport Referring Phys:  [id number] Val Riles Diagnosing Phys: Nelva Bush MD  Sonographer Comments: Technically difficult study due to poor echo windows. Pt has pulmonary fibrosis. IMPRESSIONS  1. Left ventricular ejection fraction, by estimation, is >55%. The left ventricle has normal function. Left ventricular endocardial border not optimally defined to evaluate regional wall motion. There is mild left ventricular hypertrophy. Left ventricular diastolic parameters are consistent with Grade II diastolic dysfunction (pseudonormalization). Elevated left atrial pressure.  2. Right ventricular systolic function was not well visualized. The right ventricular size is mildly enlarged. Tricuspid regurgitation signal is inadequate for assessing PA pressure.  3. Left atrial size was mildly dilated.  4. The mitral valve was not well visualized. Trivial mitral valve regurgitation. No evidence of mitral  stenosis.  5. The aortic valve was not well visualized. Aortic valve regurgitation is not visualized. No aortic stenosis is present. FINDINGS  Left Ventricle: Left ventricular ejection fraction, by estimation, is >55%. The left ventricle has normal function. Left ventricular endocardial border not optimally defined to evaluate regional wall motion. The left ventricular internal cavity size was  normal in size. There is mild left ventricular hypertrophy. Left ventricular diastolic parameters are consistent with Grade II diastolic dysfunction (pseudonormalization). Elevated left atrial pressure. Right Ventricle: The right ventricular size is mildly enlarged. Right vetricular wall thickness was not well visualized. Right ventricular systolic function was not well visualized. Tricuspid regurgitation signal is inadequate for assessing PA pressure. Left Atrium: Left atrial size was mildly dilated. Right Atrium: Right atrial size was normal in size. Pericardium: The pericardium was not well visualized. Mitral Valve: The mitral valve was not well visualized. Trivial mitral valve regurgitation. No evidence of mitral valve stenosis. MV peak gradient, 3.8 mmHg. The mean mitral valve gradient is 2.0 mmHg. Tricuspid Valve: The tricuspid valve is not well visualized. Tricuspid valve regurgitation is trivial. Aortic Valve: The aortic valve was not well visualized. Aortic valve regurgitation is not visualized. No aortic stenosis is present. Aortic valve mean gradient measures 2.0 mmHg. Aortic valve peak gradient measures 4.7 mmHg. Aortic valve area, by VTI measures 2.89 cm. Pulmonic Valve: The pulmonic valve was not well visualized. Pulmonic valve regurgitation is not visualized. No evidence of pulmonic stenosis. Aorta: The aortic root is normal in size and structure. Pulmonary Artery: The pulmonary artery is not well seen. Venous: The inferior vena cava was not well  visualized. IAS/Shunts: The interatrial septum was not well  visualized.  LEFT VENTRICLE PLAX 2D LVIDd:         4.20 cm   Diastology LVIDs:         3.00 cm   LV e' medial:    5.11 cm/s LV PW:         0.95 cm   LV E/e' medial:  17.7 LV IVS:        1.17 cm   LV e' lateral:   9.36 cm/s LVOT diam:     2.10 cm   LV E/e' lateral: 9.7 LV SV:         56 LV SV Index:   28 LVOT Area:     3.46 cm  RIGHT VENTRICLE RV Basal diam:  4.50 cm RV S prime:     11.70 cm/s LEFT ATRIUM           Index        RIGHT ATRIUM           Index LA diam:      3.80 cm 1.88 cm/m   RA Area:     17.50 cm LA Vol (A4C): 65.9 ml 32.59 ml/m  RA Volume:   44.00 ml  21.76 ml/m  AORTIC VALVE                    PULMONIC VALVE AV Area (Vmax):    2.81 cm     PV Vmax:        0.74 m/s AV Area (Vmean):   2.90 cm     PV Vmean:       48.500 cm/s AV Area (VTI):     2.89 cm     PV VTI:         0.124 m AV Vmax:           108.00 cm/s  PV Peak grad:   2.2 mmHg AV Vmean:          61.100 cm/s  PV Mean grad:   1.0 mmHg AV VTI:            0.193 m      RVOT Peak grad: 4 mmHg AV Peak Grad:      4.7 mmHg AV Mean Grad:      2.0 mmHg LVOT Vmax:         87.50 cm/s LVOT Vmean:        51.100 cm/s LVOT VTI:          0.161 m LVOT/AV VTI ratio: 0.83  AORTA Ao Root diam: 3.30 cm MITRAL VALVE MV Area (PHT): 4.26 cm    SHUNTS MV Area VTI:   2.00 cm    Systemic VTI:  0.16 m MV Peak grad:  3.8 mmHg    Systemic Diam: 2.10 cm MV Mean grad:  2.0 mmHg    Pulmonic VTI:  0.125 m MV Vmax:       0.98 m/s MV Vmean:      60.0 cm/s MV Decel Time: 178 msec MV E velocity: 90.40 cm/s MV A velocity: 78.40 cm/s MV E/A ratio:  1.15 Harrell Gave End MD Electronically signed by Nelva Bush MD Signature Date/Time: 12/12/2020/2:45:55 PM    Final    DG Chest 2 View  Result Date: 01/09/2021 CLINICAL DATA:  Recurrent cough, recent pneumonia, atrial fibrillation EXAM: CHEST - 2 VIEW COMPARISON:  12/20/2020, 02/12/2020 FINDINGS: Similar chronic basilar bandlike opacities more compatible with atelectasis and or scarring. Chronic right base vague  opacity  correlates with a known diaphragmatic hernia when compared to the prior CT of 02/12/2020. Heart is enlarged. No CHF pattern or effusion. No pneumothorax. Trachea midline. Degenerative changes of the spine. IMPRESSION: Stable cardiomegaly and bibasilar atelectasis versus scarring. Chronic right base vague opacity correlates with a known fat containing diaphragmatic hernia by comparison CT. Electronically Signed   By: Jerilynn Mages.  Shick M.D.   On: 01/09/2021 11:25     Assessment & Plan:   Vernice was seen today for cough.  Diagnoses and all orders for this visit:  Pleural effusion, right- The effusion has resolved. -     DG Chest 2 View; Future  Pneumonia of right lower lobe due to infectious organism- Her chest x-ray is negative for infiltrate. -     DG Chest 2 View; Future  Mild persistent asthma with acute exacerbation -     albuterol (VENTOLIN HFA) 108 (90 Base) MCG/ACT inhaler; Inhale 2 puffs into the lungs every 6 (six) hours as needed for wheezing or shortness of breath.  LRTI (lower respiratory tract infection)- Screening for influenza and COVID-19 is negative.  Will offer symptom relief. -     HYDROcodone bit-homatropine (HYCODAN) 5-1.5 MG/5ML syrup; Take 5 mLs by mouth every 6 (six) hours as needed for up to 10 days for cough.  I am having Emonii A. Branam start on HYDROcodone bit-homatropine. I am also having her maintain her amiodarone, hydrochlorothiazide, FreeStyle Libre 2 Sensor, [employer] 2 Reader, spironolactone, dextromethorphan-guaiFENesin, atorvastatin, apixaban, Fluticasone-Umeclidin-Vilant, levothyroxine, metFORMIN, metoprolol succinate, PARoxetine, zolpidem, and albuterol.  Meds ordered this encounter  Medications   albuterol (VENTOLIN HFA) 108 (90 Base) MCG/ACT inhaler    Sig: Inhale 2 puffs into the lungs every 6 (six) hours as needed for wheezing or shortness of breath.    Dispense:  18 g    Refill:  2   HYDROcodone bit-homatropine (HYCODAN) 5-1.5 MG/5ML syrup     Sig: Take 5 mLs by mouth every 6 (six) hours as needed for up to 10 days for cough.    Dispense:  120 mL    Refill:  0     Follow-up: Return if symptoms worsen or fail to improve.  [name], MD

## 2021-01-09 NOTE — Patient Instructions (Signed)
Viral Respiratory Infection A respiratory infection is an illness that affects part of the respiratory system, such as the lungs, nose, or throat. A respiratory infection that is caused by a virus is called a viral respiratory infection. Common types of viral respiratory infections include: A cold. The flu (influenza). A respiratory syncytial virus (RSV) infection. What are the causes? This condition is caused by a virus. The virus may spread through contact with droplets or direct contact with infected people or their mucus or secretions. The virus may spread from person to person (is contagious). What are the signs or symptoms? Symptoms of this condition include: A stuffy or runny nose. A sore throat or cough. Shortness of breath or difficulty breathing. Yellow or green mucus (sputum). Other symptoms may include: A fever. Sweating or chills. Fatigue. Achy muscles. A headache. How is this diagnosed? This condition may be diagnosed based on: Your symptoms. A physical exam. Testing of secretions from the nose or throat. Chest X-ray. How is this treated? This condition may be treated with medicines, such as: Antiviral medicine. This may shorten the length of time a person has symptoms. Expectorants. These make it easier to cough up mucus. Decongestant nasal sprays. Acetaminophen or NSAIDs, such as ibuprofen, to relieve fever and pain. Antibiotic medicines are not prescribed for viral infections.This is because antibiotics are designed to kill bacteria. They do not kill viruses. Follow these instructions at home: Managing pain and congestion Take over-the-counter and prescription medicines only as told by your health care provider. If you have a sore throat, gargle with a mixture of salt and water 3-4 times a day or as needed. To make salt water, completely dissolve -1 tsp (3-6 g) of salt in 1 cup (237 mL) of warm water. Use nose drops made from salt water to ease congestion and  soften raw skin around your nose. Take 2 tsp (10 mL) of honey at bedtime to lessen coughing at night. Do not give honey to children who are younger than 1 year. Drink enough fluid to keep your urine pale yellow. This helps prevent dehydration and helps loosen up mucus. General instructions  Rest as much as possible. Do not drink alcohol. Do not use any products that contain nicotine or tobacco. These products include cigarettes, chewing tobacco, and vaping devices, such as e-cigarettes. If you need help quitting, ask your health care provider. Keep all follow-up visits. This is important. How is this prevented?   Get an annual flu shot. You may get the flu shot in late summer, fall, or winter. Ask your health care provider when you should get your flu shot. Avoid spreading your infection to other people. If you are sick: Wash your hands with soap and water often, especially after you cough or sneeze. Wash for at least 20 seconds. If soap and water are not available, use alcohol-based hand sanitizer. Cover your mouth when you cough. Cover your nose and mouth when you sneeze. Do not share cups or eating utensils. Clean commonly used objects often. Clean commonly touched surfaces. Stay home from work or school as told by your health care provider. Avoid contact with people who are sick during cold and flu season. This is generally fall and winter. Contact a health care provider if: Your symptoms last for 10 days or longer. Your symptoms get worse over time. You have severe sinus pain in your face or forehead. The glands in your jaw or neck become very swollen. You have shortness of breath. Get help right  away if you: Feel pain or pressure in your chest. Have trouble breathing. Faint or feel like you will faint. Have severe and persistent vomiting. Feel confused or disoriented. These symptoms may represent a serious problem that is an emergency. Do not wait to see if the symptoms will go  away. Get medical help right away. Call your local emergency services (911 in the [place] Do not drive yourself to the hospital. Summary A respiratory infection is an illness that affects part of the respiratory system, such as the lungs, nose, or throat. A respiratory infection that is caused by a virus is called a viral respiratory infection. Common types of viral respiratory infections include a cold, influenza, and respiratory syncytial virus (RSV) infection. Symptoms of this condition include a stuffy or runny nose, cough, fatigue, achy muscles, sore throat, and fevers or chills. Antibiotic medicines are not prescribed for viral infections. This is because antibiotics are designed to kill bacteria. They are not effective against viruses. This information is not intended to replace advice given to you by your health care provider. Make sure you discuss any questions you have with your health care provider. Document Revised: 05/05/2020 Document Reviewed: 05/05/2020 Elsevier Patient Education  [place].

## 2021-01-10 LAB — POC COVID19 BINAXNOW: SARS Coronavirus 2 Ag: NEGATIVE

## 2021-01-10 LAB — POCT INFLUENZA A/B
Influenza A, POC: NEGATIVE
Influenza B, POC: NEGATIVE

## 2021-01-11 ENCOUNTER — Other Ambulatory Visit: Payer: Self-pay | Admitting: Internal Medicine

## 2021-01-11 DIAGNOSIS — E039 Hypothyroidism, unspecified: Secondary | ICD-10-CM

## 2021-01-11 LAB — POC INFLUENZA A&B (BINAX/QUICKVUE)
Influenza A, POC: NEGATIVE
Influenza B, POC: NEGATIVE

## 2021-01-11 NOTE — Addendum Note (Signed)
Addended by: Marcina Millard on: 01/11/2021 03:17 PM   Modules accepted: Orders

## 2021-01-17 ENCOUNTER — Ambulatory Visit: Payer: PPO

## 2021-01-25 LAB — HM PAP SMEAR

## 2021-01-30 ENCOUNTER — Telehealth: Payer: Self-pay | Admitting: Pulmonary Disease

## 2021-01-30 NOTE — Telephone Encounter (Signed)
Lm for patient.  

## 2021-01-30 NOTE — Telephone Encounter (Signed)
Patient was scheduled to see Dr. Halford Chessman on 12/01/2020. Patient no showed that visit.   She is requesting order for new machine. Patient stated that her current machine is not broken, she is just due for a new machine.  She is aware that appt is needed prior.  OV scheduled 03/23/2021 with Dr. Halford Chessman.

## 2021-02-09 ENCOUNTER — Ambulatory Visit: Payer: PPO | Admitting: Internal Medicine

## 2021-02-13 ENCOUNTER — Encounter: Payer: Self-pay | Admitting: Internal Medicine

## 2021-02-14 NOTE — Telephone Encounter (Signed)
I have attempted to schedule the pt for tomorrow Wed 1/4 @ 9.20 or 11.20. Pt said she would have to check with her husband to see if he could drive her and give [place] a call back to let us know which slot would work for her.

## 2021-02-15 ENCOUNTER — Other Ambulatory Visit: Payer: Self-pay | Admitting: Internal Medicine

## 2021-02-15 DIAGNOSIS — E118 Type 2 diabetes mellitus with unspecified complications: Secondary | ICD-10-CM

## 2021-02-15 MED ORDER — FREESTYLE LIBRE 3 SENSOR MISC: 1.0000 | Freq: Every day | 5 refills | Status: DC

## 2021-02-21 ENCOUNTER — Ambulatory Visit (INDEPENDENT_AMBULATORY_CARE_PROVIDER_SITE_OTHER): Payer: PPO | Admitting: Internal Medicine

## 2021-02-21 ENCOUNTER — Other Ambulatory Visit: Payer: Self-pay

## 2021-02-21 ENCOUNTER — Encounter: Payer: Self-pay | Admitting: Internal Medicine

## 2021-02-21 DIAGNOSIS — I10 Essential (primary) hypertension: Secondary | ICD-10-CM | POA: Diagnosis not present

## 2021-02-21 DIAGNOSIS — E118 Type 2 diabetes mellitus with unspecified complications: Secondary | ICD-10-CM | POA: Diagnosis not present

## 2021-02-21 DIAGNOSIS — J329 Chronic sinusitis, unspecified: Secondary | ICD-10-CM

## 2021-02-21 DIAGNOSIS — R062 Wheezing: Secondary | ICD-10-CM | POA: Diagnosis not present

## 2021-02-21 MED ORDER — HYDROCODONE BIT-HOMATROP MBR 5-1.5 MG/5ML PO SOLN: 5.0000 mL | Freq: Four times a day (QID) | ORAL | 0 refills | Status: AC | PRN

## 2021-02-21 MED ORDER — LEVOFLOXACIN 500 MG PO TABS: 500.0000 mg | ORAL_TABLET | Freq: Every day | ORAL | 0 refills | Status: AC

## 2021-02-21 MED ORDER — PREDNISONE 10 MG PO TABS: ORAL_TABLET | ORAL | 0 refills | Status: DC

## 2021-02-21 NOTE — Progress Notes (Signed)
Patient ID: Kirsten [name], female   DOB: Jan 14, 1956, 66 y.o.   MRN: [id number]        Chief Complaint: follow up sinus symptoms and wheezing       HPI:  Kirsten [name] is a 66 y.o. female  Here with 2-3 days acute onset fever, facial pain, pressure, headache, general weakness and malaise, and greenish d/c, with mild ST and cough, but pt denies chest pain, orthopnea, PND, increased LE swelling, palpitations, dizziness or syncope, but has had increase 1 day mild wheezing.sob.doe.   Pt denies polydipsia, polyuria, or new focal neuro s/s.    Pt denies fever, wt loss, night sweats, loss of appetite, or other constitutional symptoms  Pt was covid neg at home this am.         Wt Readings from Last 3 Encounters:  02/21/21 194 lb (88 kg)  01/09/21 201 lb (91.2 kg)  12/20/20 195 lb (88.5 kg)   BP Readings from Last 3 Encounters:  02/21/21 130/70  01/09/21 136/88  12/20/20 124/76         Past Medical History:  Diagnosis Date   A-fib ([place])    Anxiety disorder    Asthma    Dyspnea    Gastroschisis    umphalocele, rotated organs abd repair until age 63   GERD (gastroesophageal reflux disease)    HTN (hypertension)    Hyperlipidemia    IBS (irritable bowel syndrome)    Morbid obesity ([place])    Target wt - 185  for BMI < 30   Obesity    OSA on CPAP    Pulmonary fibrosis ([facility])    SBO (small bowel obstruction) ([place])    Resolved with NG/Bowel rest around 2009   Sleep apnea    Type II or unspecified type diabetes mellitus without mention of complication, not stated as uncontrolled    Past Surgical History:  Procedure Laterality Date   BREAST EXCISIONAL BIOPSY Left 03/19/2012   neg   CESAREAN SECTION     CHOLECYSTECTOMY  1992   COLONOSCOPY  2011   2011-normal   Newborn Surgery - GI - ORGANS OUTSIDE ABDOMEN     Small Bowel Repair     TUBAL LIGATION  1988    reports that she has never smoked. She has never used smokeless tobacco. She reports current alcohol use of about 2.0 standard  drinks per week. She reports that she does not use drugs. family history includes Colon cancer in her father; Diabetes in her maternal grandmother; Diabetes type I in her son; Diabetes type II in her father; Lymphoma in her mother. Allergies  Allergen Reactions   Doxycycline Rash   Factive [Gemifloxacin Mesylate] Rash   Crestor [Rosuvastatin]     GI upset   Sulfonamide Derivatives     REACTION: rash   Gemifloxacin Rash   Penicillins Hives and Rash    Has patient had a PCN reaction causing immediate rash, facial/tongue/throat swelling, SOB or lightheadedness with hypotension: No Has patient had a PCN reaction causing severe rash involving mucus membranes or skin necrosis: No Has patient had a PCN reaction that required hospitalization No Has patient had a PCN reaction occurring within the last 10 years: No If all of the above answers are "NO", then may proceed with Cephalosporin use.    Current Outpatient Medications on File Prior to Visit  Medication Sig Dispense Refill   albuterol (VENTOLIN HFA) 108 (90 Base) MCG/ACT inhaler Inhale 2 puffs into the lungs every 6 (six)  hours as needed for wheezing or shortness of breath. 18 g 2   amiodarone (PACERONE) 200 MG tablet Take 1 tablet (200 mg total) by mouth daily. 90 tablet 1   apixaban (ELIQUIS) 5 MG TABS tablet Take 1 tablet (5 mg total) by mouth 2 (two) times daily. 180 tablet 1   atorvastatin (LIPITOR) 40 MG tablet TAKE 1 TABLET(40 MG) BY MOUTH DAILY 90 tablet 1   Continuous Blood Gluc Sensor (FREESTYLE LIBRE 3 SENSOR) MISC 1 Act by Does not apply route daily. 1 each 5   dextromethorphan-guaiFENesin (MUCINEX DM) 30-600 MG 12hr tablet Take 1 tablet by mouth 2 (two) times daily as needed for cough. 30 tablet 0   hydrochlorothiazide (HYDRODIURIL) 25 MG tablet Take 1 tablet by mouth daily.     levothyroxine (SYNTHROID) 150 MCG tablet Take 1 tablet (150 mcg total) by mouth daily. 90 tablet 0   metFORMIN (GLUCOPHAGE) 1000 MG tablet One po BID  180 tablet 1   metoprolol succinate (TOPROL-XL) 50 MG 24 hr tablet Take 1 tablet (50 mg total) by mouth daily. 90 tablet 1   PARoxetine (PAXIL-CR) 25 MG 24 hr tablet TAKE 1 TABLET(25 MG) BY MOUTH DAILY 90 tablet 1   zolpidem (AMBIEN) 10 MG tablet Take 1 tablet (10 mg total) by mouth at bedtime as needed for sleep. 90 tablet 0   Fluticasone-Umeclidin-Vilant (TRELEGY ELLIPTA) 100-62.5-25 MCG/ACT AEPB Inhale 1 puff into the lungs daily. (Patient not taking: Reported on 02/21/2021) 120 each 1   spironolactone (ALDACTONE) 25 MG tablet Take 12.5 mg by mouth daily. (Patient not taking: Reported on 02/21/2021)     No current facility-administered medications on file prior to visit.        ROS:  All others reviewed and negative.  Objective        PE:  BP 130/70 (BP Location: Left Arm, Patient Position: Sitting, Cuff Size: Large)    Pulse 70    Temp 98.2 F (36.8 C) (Oral)    Ht 5' 7"  (1.702 m)    Wt 194 lb (88 kg)    SpO2 96%    BMI 30.38 kg/m                 Constitutional: Pt appears in NAD, mild ill               HENT: Head: NCAT.                Right Ear: External ear normal.                 Left Ear: External ear normal. Bilat tm's with mild erythema.  Max sinus areas mild tender.  Pharynx with mild erythema, no exudate               Eyes: . Pupils are equal, round, and reactive to light. Conjunctivae and EOM are normal               Nose: without d/c or deformity               Neck: Neck supple. Gross normal ROM               Cardiovascular: Normal rate and regular rhythm.                 Pulmonary/Chest: Effort normal and breath sounds decresaed without rales but with trace wheezing.  Neurological: Pt is alert. At baseline orientation, motor grossly intact               Skin: Skin is warm. No rashes, no other new lesions, LE edema - none               Psychiatric: Pt behavior is normal without agitation   Micro: none  Cardiac tracings I have personally  interpreted today:  none  Pertinent Radiological findings (summarize): none   Lab Results  Component Value Date   WBC 15.2 (H) 12/11/2020   HGB 13.2 12/11/2020   HCT 38.8 12/11/2020   PLT 298 12/11/2020   GLUCOSE 153 (H) 01/09/2021   CHOL 120 07/19/2020   TRIG 131.0 07/19/2020   HDL 45.00 07/19/2020   LDLDIRECT 63.0 01/15/2019   LDLCALC 48 07/19/2020   ALT 49 (H) 10/18/2020   AST 53 (H) 10/18/2020   NA 138 01/09/2021   K 3.7 01/09/2021   CL 98 01/09/2021   CREATININE 0.78 01/09/2021   BUN 11 01/09/2021   CO2 32 01/09/2021   TSH 6.37 (H) 10/18/2020   INR 1.4 (H) 06/02/2019   HGBA1C 6.3 (H) 12/06/2020   MICROALBUR 2.0 (H) 11/16/2019   Assessment/Plan:  KLARISA BARMAN is a 66 y.o. White or Caucasian [1] female with  has a past medical history of A-fib ([place]), Anxiety disorder, Asthma, Dyspnea, Gastroschisis, GERD (gastroesophageal reflux disease), HTN (hypertension), Hyperlipidemia, IBS (irritable bowel syndrome), Morbid obesity ([place]), Obesity, OSA on CPAP, Pulmonary fibrosis ([place]), SBO (small bowel obstruction) ([place]), Sleep apnea, and Type II or unspecified type diabetes mellitus without mention of complication, not stated as uncontrolled.  Sinusitis Mild to mod, for antibx course,  to f/u any worsening symptoms or concerns  Wheezing Mild to mod, for prednisone asd,  to f/u any worsening symptoms  Essential hypertension, benign BP Readings from Last 3 Encounters:  02/21/21 130/70  01/09/21 136/88  12/20/20 124/76   Stable, pt to continue medical treatment hct, toprol   Type II diabetes mellitus with manifestations ([place]) Lab Results  Component Value Date   HGBA1C 6.3 (H) 12/06/2020   Stable, pt to continue current medical treatment metformiin  Followup: Return if symptoms worsen or fail to improve.  Cathlean Cower, MD 02/26/2021 5:58 AM [facility]

## 2021-02-21 NOTE — Patient Instructions (Signed)
Please take all new medication as prescribed - the antibiotic, cough medicine, and prednisone  Please continue all other medications as before, and refills have been done if requested.  Please have the pharmacy call with any other refills you may need.  Please keep your appointments with your specialists as you may have planned

## 2021-02-26 ENCOUNTER — Encounter: Payer: Self-pay | Admitting: Internal Medicine

## 2021-02-26 DIAGNOSIS — J329 Chronic sinusitis, unspecified: Secondary | ICD-10-CM | POA: Insufficient documentation

## 2021-02-26 DIAGNOSIS — R062 Wheezing: Secondary | ICD-10-CM | POA: Insufficient documentation

## 2021-02-26 NOTE — Assessment & Plan Note (Signed)
Lab Results  Component Value Date   HGBA1C 6.3 (H) 12/06/2020   Stable, pt to continue current medical treatment metformiin

## 2021-02-26 NOTE — Assessment & Plan Note (Signed)
BP Readings from Last 3 Encounters:  02/21/21 130/70  01/09/21 136/88  12/20/20 124/76   Stable, pt to continue medical treatment hct, toprol

## 2021-02-26 NOTE — Assessment & Plan Note (Signed)
Mild to mod, for antibx course,  to f/u any worsening symptoms or concerns 

## 2021-02-26 NOTE — Assessment & Plan Note (Signed)
Mild to mod, for prednisone asd,  to f/u any worsening symptoms

## 2021-02-28 ENCOUNTER — Encounter: Payer: Self-pay | Admitting: Emergency Medicine

## 2021-02-28 ENCOUNTER — Emergency Department: Payer: PPO

## 2021-02-28 ENCOUNTER — Emergency Department
Admission: EM | Admit: 2021-02-28 | Discharge: 2021-02-28 | Disposition: A | Discharge status: Home / Self Care | Payer: PPO | Source: Home / Self Care | Attending: Emergency Medicine | Admitting: Emergency Medicine

## 2021-02-28 ENCOUNTER — Emergency Department
Admission: EM | Admit: 2021-02-28 | Discharge: 2021-02-28 | Disposition: A | Discharge status: Home / Self Care | Payer: PPO | Attending: Emergency Medicine | Admitting: Emergency Medicine

## 2021-02-28 ENCOUNTER — Other Ambulatory Visit: Payer: Self-pay

## 2021-02-28 DIAGNOSIS — R55 Syncope and collapse: Secondary | ICD-10-CM | POA: Insufficient documentation

## 2021-02-28 DIAGNOSIS — S6992XA Unspecified injury of left wrist, hand and finger(s), initial encounter: Secondary | ICD-10-CM | POA: Insufficient documentation

## 2021-02-28 DIAGNOSIS — M79642 Pain in left hand: Secondary | ICD-10-CM | POA: Insufficient documentation

## 2021-02-28 DIAGNOSIS — W19XXXA Unspecified fall, initial encounter: Secondary | ICD-10-CM

## 2021-02-28 DIAGNOSIS — S022XXB Fracture of nasal bones, initial encounter for open fracture: Secondary | ICD-10-CM | POA: Insufficient documentation

## 2021-02-28 DIAGNOSIS — M25532 Pain in left wrist: Secondary | ICD-10-CM | POA: Insufficient documentation

## 2021-02-28 DIAGNOSIS — M4312 Spondylolisthesis, cervical region: Secondary | ICD-10-CM | POA: Diagnosis not present

## 2021-02-28 DIAGNOSIS — S0003XA Contusion of scalp, initial encounter: Secondary | ICD-10-CM | POA: Diagnosis not present

## 2021-02-28 DIAGNOSIS — S0992XA Unspecified injury of nose, initial encounter: Secondary | ICD-10-CM | POA: Diagnosis present

## 2021-02-28 DIAGNOSIS — S0990XA Unspecified injury of head, initial encounter: Secondary | ICD-10-CM | POA: Insufficient documentation

## 2021-02-28 DIAGNOSIS — S022XXA Fracture of nasal bones, initial encounter for closed fracture: Secondary | ICD-10-CM | POA: Insufficient documentation

## 2021-02-28 DIAGNOSIS — S63502A Unspecified sprain of left wrist, initial encounter: Secondary | ICD-10-CM | POA: Diagnosis not present

## 2021-02-28 DIAGNOSIS — M5031 Other cervical disc degeneration,  high cervical region: Secondary | ICD-10-CM | POA: Diagnosis not present

## 2021-02-28 DIAGNOSIS — W01198A Fall on same level from slipping, tripping and stumbling with subsequent striking against other object, initial encounter: Secondary | ICD-10-CM | POA: Diagnosis not present

## 2021-02-28 DIAGNOSIS — Z043 Encounter for examination and observation following other accident: Secondary | ICD-10-CM | POA: Diagnosis not present

## 2021-02-28 DIAGNOSIS — M4602 Spinal enthesopathy, cervical region: Secondary | ICD-10-CM | POA: Diagnosis not present

## 2021-02-28 MED ORDER — BACITRACIN-NEOMYCIN-POLYMYXIN 400-5-5000 EX OINT
TOPICAL_OINTMENT | Freq: Once | CUTANEOUS | Status: DC
  Filled 2021-02-28: qty 1

## 2021-02-28 MED ORDER — OXYCODONE-ACETAMINOPHEN 5-325 MG PO TABS
1.0000 | ORAL_TABLET | ORAL | 0 refills | Status: DC | PRN
Start: 2021-02-28 — End: 2021-03-22

## 2021-02-28 MED ORDER — OXYCODONE-ACETAMINOPHEN 5-325 MG PO TABS
1.0000 | ORAL_TABLET | Freq: Once | ORAL | Status: AC
  Administered 2021-02-28: 1 via ORAL
  Filled 2021-02-28: qty 1

## 2021-02-28 MED ORDER — CLINDAMYCIN HCL 150 MG PO CAPS: 300.0000 mg | ORAL_CAPSULE | Freq: Three times a day (TID) | ORAL | 0 refills | Status: AC

## 2021-02-28 MED ORDER — OXYCODONE HCL 5 MG PO TABS
5.0000 mg | ORAL_TABLET | Freq: Once | ORAL | Status: AC
  Administered 2021-02-28: 5 mg via ORAL
  Filled 2021-02-28: qty 1

## 2021-02-28 NOTE — ED Notes (Signed)
See triage note  presents s/p trip and fall  abrasions with bruising and swelling noted to nose and forehead

## 2021-02-28 NOTE — ED Notes (Signed)
Pt states that after she got home, her hand started hurting. Injured in same fall from earlier today.

## 2021-02-28 NOTE — Discharge Instructions (Signed)
Follow-up with your regular doctor as needed.  Follow-up with Dr. Tami [name] if you do not like the appearance of your nose in 1 week.  The ENT does not not do anything with a fractured nose until the swelling has gone down.  Return the emergency department if you feel that you are worsening.  Pain medication as needed.  Percocet is a narcotic and can cause addiction problems.  Do not take this medication with the Hycodan cough syrup.  This would be too much pain medication and 1 dose. Do not take the clindamycin until you have finished your Levaquin.  At that time you may wait to see if there is any sign of redness pus or swelling.  You may take the clindamycin if you begin to have these symptoms.

## 2021-02-28 NOTE — ED Triage Notes (Signed)
Pt was seen here earlier for a fall, she got home and her left hand and wrist began hurting her. No deformity seen.

## 2021-02-28 NOTE — ED Triage Notes (Signed)
Pt was at the store and tripped over a clump of grass. She hit her face on concrete. Pt does take blood thinner. She has an abrasion on her nose, bleeding under control. No LOC.

## 2021-02-28 NOTE — ED Provider Notes (Signed)
° °  [facility] Provider Note    Event Date/Time   First MD Initiated Contact with Patient 02/28/21 1614     (approximate)   History   Hand Injury   HPI  Kirsten [name] is a 66 y.o. female presents to the emergency department for evaluation of left hand and wrist pain after a mechanical, nonsyncopal fall earlier today.  She was evaluated for minor head injury and nasal bone fracture.  When she got home, she noticed that her hand and wrist were painful as well.     Physical Exam   Triage Vital Signs: ED Triage Vitals  Enc Vitals Group     BP 02/28/21 1607 137/61     Pulse Rate 02/28/21 1607 71     Resp 02/28/21 1607 16     Temp 02/28/21 1607 98.1 F (36.7 C)     Temp Source 02/28/21 1607 Oral     SpO2 02/28/21 1607 96 %     Weight 02/28/21 1608 195 lb (88.5 kg)     Height 02/28/21 1608 5' 7"  (1.702 m)     Head Circumference --      Peak Flow --      Pain Score 02/28/21 1608 7     Pain Loc --      Pain Edu? --      Excl. in [place]? --     Most recent vital signs: Vitals:   02/28/21 1607  BP: 137/61  Pulse: 71  Resp: 16  Temp: 98.1 F (36.7 C)  SpO2: 96%     General: Awake, no distress.  CV:  Good peripheral perfusion.  Resp:  Normal effort.  Abd:  No distention.  Other:     ED Results / Procedures / Treatments   Labs (all labs ordered are listed, but only abnormal results are displayed) Labs Reviewed - No data to display   EKG  Not indicated   RADIOLOGY Images of the left hand reviewed by me. Radiology report also viewed.   PROCEDURES:  Critical Care performed: No  Procedures   MEDICATIONS ORDERED IN ED: Medications  oxyCODONE (Oxy IR/ROXICODONE) immediate release tablet 5 mg (5 mg Oral Given 02/28/21 1706)     IMPRESSION / MDM / ASSESSMENT AND PLAN / ED COURSE  I reviewed the triage vital signs and the nursing notes.  Differential diagnosis includes, but is not limited to, wrist sprain, wrist fracture.    On exam, no deformity of left hand or wrist. No focal tenderness over scaphoid.  Placed in velcro wrist splint. Pain not well controlled with medication prescribed earlier. Additional dose given while here. She is to follow up with orthopedics or primary care if not improving over the week.      FINAL CLINICAL IMPRESSION(S) / ED DIAGNOSES   Final diagnoses:  Left hand pain  Left wrist sprain, initial encounter     Rx / DC Orders   ED Discharge Orders     None        Note:  This document was prepared using Dragon voice recognition software and may include unintentional dictation errors.   Victorino Dike, FNP 02/28/21 1925    Duffy Bruce, MD 03/02/21 1944

## 2021-02-28 NOTE — ED Provider Notes (Signed)
[facility] Provider Note    Event Date/Time   First MD Initiated Contact with Patient 02/28/21 1146     (approximate)   History   Fall   HPI  Kirsten [name] is a 66 y.o. female presents emergency department after a fall.  Patient tripped over a clump of grass fell to her knees and then fell forward hitting her face on concrete.  Patient is on a blood thinner.  She has laceration to the nose.  Swelling to the forehead.  She did not lose consciousness.  Denies any other injuries at this time.  Tdap is up-to-date      Physical Exam   Triage Vital Signs: ED Triage Vitals  Enc Vitals Group     BP 02/28/21 1125 (!) 146/73     Pulse Rate 02/28/21 1125 77     Resp 02/28/21 1125 18     Temp 02/28/21 1125 98.3 F (36.8 C)     Temp Source 02/28/21 1125 Oral     SpO2 02/28/21 1125 97 %     Weight 02/28/21 1123 195 lb (88.5 kg)     Height 02/28/21 1123 5' 7"  (1.702 m)     Head Circumference --      Peak Flow --      Pain Score 02/28/21 1123 7     Pain Loc --      Pain Edu? --      Excl. in [place]? --     Most recent vital signs: Vitals:   02/28/21 1125  BP: (!) 146/73  Pulse: 77  Resp: 18  Temp: 98.3 F (36.8 C)  SpO2: 97%     General: Awake, no distress.   CV:  Good peripheral perfusion. regular rate and  rhythm Resp:  Normal effort.  Abd:  No distention.   Other:  Facial contusions noted, laceration noted on the bridge of the nose, swelling noted to the nose, no septal hematoma is noted   ED Results / Procedures / Treatments   Labs (all labs ordered are listed, but only abnormal results are displayed) Labs Reviewed - No data to display   EKG     RADIOLOGY CT of the head, C-spine, maxillofacial ordered    PROCEDURES:   Procedures   MEDICATIONS ORDERED IN ED: Medications  oxyCODONE-acetaminophen (PERCOCET/ROXICET) 5-325 MG per tablet 1 tablet (has no administration in time range)     IMPRESSION / MDM / ASSESSMENT  AND PLAN / ED COURSE  I reviewed the triage vital signs and the nursing notes.                              Differential diagnosis includes, but is not limited to, nasal bone fracture, facial fracture, subdural, subarachnoid hemorrhage, C-spine fracture  CT of the head was reviewed by me.  Radiologist read as negative for intracranial hemorrhage or abnormality CT of the maxillofacial was noted to have a nasal bone fracture by me.  Radiologist confirms CT of the cervical spine was read as negative by the radiologist  Patient will placed on a antibiotic, however she is on Levaquin at this time.  She has 3 days left on this medication.  Explained to her that she should finish this medication and she could take the clindamycin for about 4 days following completion of the Levaquin.  However she begins to get diarrhea she should stop the medication immediately.  She was also  cautioned not to take the Percocet while taking Hycodan cough syrup.  Explained to her that would be too much narcotic at 1 time.  Her Tdap is up-to-date so we did not update this.  I feel that patient is stable to go home and she does not have a traumatic brain injury at this time.  She is to follow-up with ENT for the nasal bone fracture.  Return if worsening.      FINAL CLINICAL IMPRESSION(S) / ED DIAGNOSES   Final diagnoses:  Open fracture of nasal bone, initial encounter  Minor head injury, initial encounter  Fall, initial encounter     Rx / DC Orders   ED Discharge Orders          Ordered    clindamycin (CLEOCIN) 150 MG capsule  3 times daily        02/28/21 1210    oxyCODONE-acetaminophen (PERCOCET) 5-325 MG tablet  Every 4 hours PRN        02/28/21 1210             Note:  This document was prepared using Dragon voice recognition software and may include unintentional dictation errors.    Versie Starks, PA-C 02/28/21 1219    Harvest Dark, MD 02/28/21 1354

## 2021-03-03 DIAGNOSIS — S66912A Strain of unspecified muscle, fascia and tendon at wrist and hand level, left hand, initial encounter: Secondary | ICD-10-CM | POA: Diagnosis not present

## 2021-03-06 DIAGNOSIS — J342 Deviated nasal septum: Secondary | ICD-10-CM | POA: Diagnosis not present

## 2021-03-06 DIAGNOSIS — S022XXA Fracture of nasal bones, initial encounter for closed fracture: Secondary | ICD-10-CM | POA: Diagnosis not present

## 2021-03-10 ENCOUNTER — Encounter: Payer: Self-pay | Admitting: Internal Medicine

## 2021-03-14 DIAGNOSIS — M25531 Pain in right wrist: Secondary | ICD-10-CM | POA: Diagnosis not present

## 2021-03-20 DIAGNOSIS — G4733 Obstructive sleep apnea (adult) (pediatric): Secondary | ICD-10-CM | POA: Diagnosis not present

## 2021-03-21 ENCOUNTER — Telehealth: Payer: Self-pay

## 2021-03-21 NOTE — Telephone Encounter (Signed)
Called and spoke to patient , she will bring sd card on Thursday. Nothing further needed.

## 2021-03-22 ENCOUNTER — Other Ambulatory Visit: Payer: Self-pay

## 2021-03-22 ENCOUNTER — Encounter: Payer: Self-pay | Admitting: Internal Medicine

## 2021-03-22 ENCOUNTER — Ambulatory Visit (INDEPENDENT_AMBULATORY_CARE_PROVIDER_SITE_OTHER): Payer: PPO | Admitting: Internal Medicine

## 2021-03-22 VITALS — BP 120/80 | HR 70 | Temp 98.3°F | Ht 67.0 in | Wt 193.0 lb

## 2021-03-22 DIAGNOSIS — K7581 Nonalcoholic steatohepatitis (NASH): Secondary | ICD-10-CM | POA: Diagnosis not present

## 2021-03-22 DIAGNOSIS — E039 Hypothyroidism, unspecified: Secondary | ICD-10-CM | POA: Diagnosis not present

## 2021-03-22 DIAGNOSIS — R270 Ataxia, unspecified: Secondary | ICD-10-CM

## 2021-03-22 DIAGNOSIS — E118 Type 2 diabetes mellitus with unspecified complications: Secondary | ICD-10-CM

## 2021-03-22 DIAGNOSIS — I480 Paroxysmal atrial fibrillation: Secondary | ICD-10-CM

## 2021-03-22 DIAGNOSIS — E538 Deficiency of other specified B group vitamins: Secondary | ICD-10-CM | POA: Diagnosis not present

## 2021-03-22 DIAGNOSIS — I10 Essential (primary) hypertension: Secondary | ICD-10-CM

## 2021-03-22 DIAGNOSIS — Z23 Encounter for immunization: Secondary | ICD-10-CM

## 2021-03-22 LAB — CBC WITH DIFFERENTIAL/PLATELET
Basophils Absolute: 0 10*3/uL (ref 0.0–0.1)
Basophils Relative: 0.5 % (ref 0.0–3.0)
Eosinophils Absolute: 0.3 10*3/uL (ref 0.0–0.7)
Eosinophils Relative: 3.9 % (ref 0.0–5.0)
HCT: 40.2 % (ref 36.0–46.0)
Hemoglobin: 13 g/dL (ref 12.0–15.0)
Lymphocytes Relative: 40.7 % (ref 12.0–46.0)
Lymphs Abs: 3.4 10*3/uL (ref 0.7–4.0)
MCHC: 32.5 g/dL (ref 30.0–36.0)
MCV: 86.3 fl (ref 78.0–100.0)
Monocytes Absolute: 0.5 10*3/uL (ref 0.1–1.0)
Monocytes Relative: 6.6 % (ref 3.0–12.0)
Neutro Abs: 4 10*3/uL (ref 1.4–7.7)
Neutrophils Relative %: 48.3 % (ref 43.0–77.0)
Platelets: 317 10*3/uL (ref 150.0–400.0)
RBC: 4.66 Mil/uL (ref 3.87–5.11)
RDW: 15.1 % (ref 11.5–15.5)
WBC: 8.3 10*3/uL (ref 4.0–10.5)

## 2021-03-22 LAB — URINALYSIS, ROUTINE W REFLEX MICROSCOPIC
Bilirubin Urine: NEGATIVE
Hgb urine dipstick: NEGATIVE
Ketones, ur: NEGATIVE
Nitrite: NEGATIVE
RBC / HPF: NONE SEEN (ref 0–?)
Specific Gravity, Urine: 1.025 (ref 1.000–1.030)
Total Protein, Urine: NEGATIVE
Urine Glucose: NEGATIVE
Urobilinogen, UA: 0.2 (ref 0.0–1.0)
pH: 5 (ref 5.0–8.0)

## 2021-03-22 LAB — BASIC METABOLIC PANEL
BUN: 16 mg/dL (ref 6–23)
CO2: 30 mEq/L (ref 19–32)
Calcium: 10.1 mg/dL (ref 8.4–10.5)
Chloride: 102 mEq/L (ref 96–112)
Creatinine, Ser: 0.82 mg/dL (ref 0.40–1.20)
GFR: 76.01 mL/min (ref >60.00)
Glucose, Bld: 116 mg/dL — ABNORMAL HIGH (ref 70–99)
Potassium: 4.1 mEq/L (ref 3.5–5.1)
Sodium: 142 mEq/L (ref 135–145)

## 2021-03-22 LAB — HEPATIC FUNCTION PANEL
ALT: 62 U/L — ABNORMAL HIGH (ref 0–35)
AST: 59 U/L — ABNORMAL HIGH (ref 0–37)
Albumin: 3.9 g/dL (ref 3.5–5.2)
Alkaline Phosphatase: 75 U/L (ref 39–117)
Bilirubin, Direct: 0.1 mg/dL (ref 0.0–0.3)
Total Bilirubin: 0.6 mg/dL (ref 0.2–1.2)
Total Protein: 6.7 g/dL (ref 6.0–8.3)

## 2021-03-22 LAB — FOLATE: Folate: 8.6 ng/mL (ref >5.9)

## 2021-03-22 LAB — HEMOGLOBIN A1C: Hgb A1c MFr Bld: 6.6 % — ABNORMAL HIGH (ref 4.6–6.5)

## 2021-03-22 LAB — TSH: TSH: 1.24 u[IU]/mL (ref 0.35–5.50)

## 2021-03-22 MED ORDER — BOOSTRIX 5-2.5-18.5 LF-MCG/0.5 IM SUSP: 0.5000 mL | Freq: Once | INTRAMUSCULAR | 0 refills | Status: AC

## 2021-03-22 MED ORDER — CYANOCOBALAMIN 1000 MCG/ML IJ SOLN
1000.0000 ug | Freq: Once | INTRAMUSCULAR | Status: AC
  Administered 2021-03-22: 1000 ug via INTRAMUSCULAR

## 2021-03-22 MED ORDER — SHINGRIX 50 MCG/0.5ML IM SUSR: 0.5000 mL | Freq: Once | INTRAMUSCULAR | 1 refills | Status: AC

## 2021-03-22 NOTE — Progress Notes (Signed)
Subjective:  Patient ID: Kirsten [name], female    DOB: Jan 29, 1956  Age: 66 y.o. MRN: [id number]  CC: Hypertension, Hypothyroidism, and Diabetes  This visit occurred during the SARS-CoV-2 public health emergency.  Safety protocols were in place, including screening questions prior to the visit, additional usage of staff PPE, and extensive cleaning of exam room while observing appropriate contact time as indicated for disinfecting solutions.    HPI Kirsten [name] presents for f/up - She is recovering from a recent fall that resulted in significant facial injuries.  She said she has had progressive leg weakness over the last few years with ataxia.  She said her right leg felt weak and she thinks she caught it on something causing her to fall.  She has done PT for this without much improvement.  She is also developed trouble with using her right hand to write. She would like to see a neurologist.  Outpatient Medications Prior to Visit  Medication Sig Dispense Refill   albuterol (VENTOLIN HFA) 108 (90 Base) MCG/ACT inhaler Inhale 2 puffs into the lungs every 6 (six) hours as needed for wheezing or shortness of breath. 18 g 2   amiodarone (PACERONE) 200 MG tablet Take 1 tablet (200 mg total) by mouth daily. 90 tablet 1   apixaban (ELIQUIS) 5 MG TABS tablet Take 1 tablet (5 mg total) by mouth 2 (two) times daily. 180 tablet 1   atorvastatin (LIPITOR) 40 MG tablet TAKE 1 TABLET(40 MG) BY MOUTH DAILY 90 tablet 1   Continuous Blood Gluc Sensor (FREESTYLE LIBRE 3 SENSOR) MISC 1 Act by Does not apply route daily. 1 each 5   hydrochlorothiazide (HYDRODIURIL) 25 MG tablet Take 1 tablet by mouth daily.     levothyroxine (SYNTHROID) 150 MCG tablet Take 1 tablet (150 mcg total) by mouth daily. 90 tablet 0   metFORMIN (GLUCOPHAGE) 1000 MG tablet One po BID 180 tablet 1   metoprolol succinate (TOPROL-XL) 50 MG 24 hr tablet Take 1 tablet (50 mg total) by mouth daily. 90 tablet 1   PARoxetine (PAXIL-CR) 25 MG  24 hr tablet TAKE 1 TABLET(25 MG) BY MOUTH DAILY 90 tablet 1   zolpidem (AMBIEN) 10 MG tablet Take 1 tablet (10 mg total) by mouth at bedtime as needed for sleep. 90 tablet 0   dextromethorphan-guaiFENesin (MUCINEX DM) 30-600 MG 12hr tablet Take 1 tablet by mouth 2 (two) times daily as needed for cough. 30 tablet 0   Fluticasone-Umeclidin-Vilant (TRELEGY ELLIPTA) 100-62.5-25 MCG/ACT AEPB Inhale 1 puff into the lungs daily. 120 each 1   oxyCODONE-acetaminophen (PERCOCET) 5-325 MG tablet Take 1 tablet by mouth every 4 (four) hours as needed for severe pain. 12 tablet 0   predniSONE (DELTASONE) 10 MG tablet 3 tabs by mouth per day for 3 days,2tabs per day for 3 days,1tab per day for 3 days 18 tablet 0   spironolactone (ALDACTONE) 25 MG tablet Take 12.5 mg by mouth daily.     No facility-administered medications prior to visit.    ROS Review of Systems  Constitutional:  Negative for chills, diaphoresis, fatigue and fever.  HENT: Negative.    Eyes: Negative.   Respiratory: Negative.  Negative for cough, chest tightness, shortness of breath and wheezing.   Cardiovascular:  Negative for chest pain, palpitations and leg swelling.  Gastrointestinal:  Negative for abdominal pain, diarrhea, nausea and vomiting.  Endocrine: Negative.   Genitourinary: Negative.  Negative for difficulty urinating.  Musculoskeletal:  Positive for gait problem.  Skin: Negative.  Neurological:  Positive for weakness. Negative for dizziness, tremors, syncope, light-headedness, numbness and headaches.  Hematological:  Negative for adenopathy. Does not bruise/bleed easily.  Psychiatric/Behavioral: Negative.     Objective:  BP 120/80 (BP Location: Left Arm, Patient Position: Sitting, Cuff Size: Large)    Pulse 70    Temp 98.3 F (36.8 C) (Oral)    Ht 5' 7"  (1.702 m)    Wt 193 lb (87.5 kg)    SpO2 97%    BMI 30.23 kg/m   BP Readings from Last 3 Encounters:  03/23/21 124/78  03/22/21 120/80  02/28/21 137/61    Wt  Readings from Last 3 Encounters:  03/23/21 194 lb (88 kg)  03/22/21 193 lb (87.5 kg)  02/28/21 195 lb (88.5 kg)    Physical Exam Vitals reviewed.  HENT:     Mouth/Throat:     Mouth: Mucous membranes are moist.  Eyes:     General: No scleral icterus.    Conjunctiva/sclera: Conjunctivae normal.  Neck:     Vascular: No carotid bruit.  Cardiovascular:     Rate and Rhythm: Normal rate and regular rhythm.     Heart sounds: No murmur heard. Pulmonary:     Effort: Pulmonary effort is normal.     Breath sounds: No stridor. No wheezing, rhonchi or rales.  Abdominal:     General: Abdomen is protuberant. There is no distension.     Palpations: Abdomen is soft. There is no hepatomegaly, splenomegaly or mass.     Tenderness: There is no abdominal tenderness.  Musculoskeletal:     Cervical back: Neck supple.  Lymphadenopathy:     Cervical: No cervical adenopathy.  Skin:    General: Skin is warm and dry.  Neurological:     Mental Status: She is alert. Mental status is at baseline.     Motor: Weakness present. No atrophy.     Gait: Gait normal.     Deep Tendon Reflexes: Reflexes normal.     Reflex Scores:      Tricep reflexes are 1+ on the right side and 1+ on the left side.      Bicep reflexes are 1+ on the right side and 1+ on the left side.      Brachioradialis reflexes are 1+ on the right side and 1+ on the left side.      Patellar reflexes are 1+ on the right side and 1+ on the left side.      Achilles reflexes are 0 on the right side and 0 on the left side.    Comments: Mild weakness in BLE  Psychiatric:        Mood and Affect: Mood normal.        Behavior: Behavior normal.    Lab Results  Component Value Date   WBC 8.3 03/22/2021   HGB 13.0 03/22/2021   HCT 40.2 03/22/2021   PLT 317.0 03/22/2021   GLUCOSE 116 (H) 03/22/2021   CHOL 120 07/19/2020   TRIG 131.0 07/19/2020   HDL 45.00 07/19/2020   LDLDIRECT 63.0 01/15/2019   LDLCALC 48 07/19/2020   ALT 62 (H) 03/22/2021    AST 59 (H) 03/22/2021   NA 142 03/22/2021   K 4.1 03/22/2021   CL 102 03/22/2021   CREATININE 0.82 03/22/2021   BUN 16 03/22/2021   CO2 30 03/22/2021   TSH 1.24 03/22/2021   INR 1.4 (H) 06/02/2019   HGBA1C 6.6 (H) 03/22/2021   MICROALBUR 0.8 03/22/2021    CT HEAD  WO CONTRAST (5MM)  Result Date: 02/28/2021 CLINICAL DATA:  Fall.  Facial injury. EXAM: CT HEAD WITHOUT CONTRAST CT MAXILLOFACIAL WITHOUT CONTRAST CT CERVICAL SPINE WITHOUT CONTRAST TECHNIQUE: Multidetector CT imaging of the head, cervical spine, and maxillofacial structures were performed using the standard protocol without intravenous contrast. Multiplanar CT image reconstructions of the cervical spine and maxillofacial structures were also generated. RADIATION DOSE REDUCTION: This exam was performed according to the departmental dose-optimization program which includes automated exposure control, adjustment of the mA and/or kV according to patient size and/or use of iterative reconstruction technique. COMPARISON:  None. FINDINGS: CT HEAD FINDINGS Brain: No evidence of acute infarction, hemorrhage, hydrocephalus, extra-axial collection or mass lesion/mass effect. Empty sella incidentally noted. Vascular: Negative for hyperdense vessel Skull: Nasal bone fracture.  Otherwise no skull fracture. Other: None CT MAXILLOFACIAL FINDINGS Osseous: Nasal bone fracture appears acute. Mild displacement. No fracture of the nasal septum or orbit. No other facial fracture identified. Orbits: Negative for orbital fracture.  No orbital mass or edema. Sinuses: Clear Soft tissues: Soft tissue hematoma left frontal scalp anteriorly just above the orbit. This is central and left-sided. CT CERVICAL SPINE FINDINGS Alignment: Straightening of the cervical lordosis. Mild anterolisthesis C3-4 Skull base and vertebrae: Negative for fracture Soft tissues and spinal canal: Negative for soft tissue swelling or mass. Disc levels: Disc degeneration and spurring most  prominent at C5-6. Facet degeneration most prominent on the left at C2-3 and C3-4 Upper chest: Lung apices clear bilaterally. Other: None IMPRESSION: 1. No acute intracranial abnormality 2. Nasal bone fracture which appears acute. Central and left-sided scalp hematoma left forehead. 3. Negative for cervical spine fracture. Electronically Signed   By: Franchot Gallo M.D.   On: 02/28/2021 11:55   CT Cervical Spine Wo Contrast  Result Date: 02/28/2021 CLINICAL DATA:  Fall.  Facial injury. EXAM: CT HEAD WITHOUT CONTRAST CT MAXILLOFACIAL WITHOUT CONTRAST CT CERVICAL SPINE WITHOUT CONTRAST TECHNIQUE: Multidetector CT imaging of the head, cervical spine, and maxillofacial structures were performed using the standard protocol without intravenous contrast. Multiplanar CT image reconstructions of the cervical spine and maxillofacial structures were also generated. RADIATION DOSE REDUCTION: This exam was performed according to the departmental dose-optimization program which includes automated exposure control, adjustment of the mA and/or kV according to patient size and/or use of iterative reconstruction technique. COMPARISON:  None. FINDINGS: CT HEAD FINDINGS Brain: No evidence of acute infarction, hemorrhage, hydrocephalus, extra-axial collection or mass lesion/mass effect. Empty sella incidentally noted. Vascular: Negative for hyperdense vessel Skull: Nasal bone fracture.  Otherwise no skull fracture. Other: None CT MAXILLOFACIAL FINDINGS Osseous: Nasal bone fracture appears acute. Mild displacement. No fracture of the nasal septum or orbit. No other facial fracture identified. Orbits: Negative for orbital fracture.  No orbital mass or edema. Sinuses: Clear Soft tissues: Soft tissue hematoma left frontal scalp anteriorly just above the orbit. This is central and left-sided. CT CERVICAL SPINE FINDINGS Alignment: Straightening of the cervical lordosis. Mild anterolisthesis C3-4 Skull base and vertebrae: Negative for  fracture Soft tissues and spinal canal: Negative for soft tissue swelling or mass. Disc levels: Disc degeneration and spurring most prominent at C5-6. Facet degeneration most prominent on the left at C2-3 and C3-4 Upper chest: Lung apices clear bilaterally. Other: None IMPRESSION: 1. No acute intracranial abnormality 2. Nasal bone fracture which appears acute. Central and left-sided scalp hematoma left forehead. 3. Negative for cervical spine fracture. Electronically Signed   By: Franchot Gallo M.D.   On: 02/28/2021 11:55   DG Hand Complete Left  Result Date: 02/28/2021 CLINICAL DATA:  [place], left hand and wrist pain EXAM: LEFT HAND - COMPLETE 3+ VIEW COMPARISON:  None. FINDINGS: Frontal, oblique, and lateral views of the left hand are obtained. No acute fracture, subluxation, or dislocation. Mild diffuse interphalangeal joint space narrowing compatible with osteoarthritis. Soft tissues are unremarkable. IMPRESSION: 1. Mild osteoarthritis.  No acute bony abnormality. Electronically Signed   By: Randa Ngo M.D.   On: 02/28/2021 16:40   CT Maxillofacial Wo Contrast  Result Date: 02/28/2021 CLINICAL DATA:  Fall.  Facial injury. EXAM: CT HEAD WITHOUT CONTRAST CT MAXILLOFACIAL WITHOUT CONTRAST CT CERVICAL SPINE WITHOUT CONTRAST TECHNIQUE: Multidetector CT imaging of the head, cervical spine, and maxillofacial structures were performed using the standard protocol without intravenous contrast. Multiplanar CT image reconstructions of the cervical spine and maxillofacial structures were also generated. RADIATION DOSE REDUCTION: This exam was performed according to the departmental dose-optimization program which includes automated exposure control, adjustment of the mA and/or kV according to patient size and/or use of iterative reconstruction technique. COMPARISON:  None. FINDINGS: CT HEAD FINDINGS Brain: No evidence of acute infarction, hemorrhage, hydrocephalus, extra-axial collection or mass lesion/mass effect.  Empty sella incidentally noted. Vascular: Negative for hyperdense vessel Skull: Nasal bone fracture.  Otherwise no skull fracture. Other: None CT MAXILLOFACIAL FINDINGS Osseous: Nasal bone fracture appears acute. Mild displacement. No fracture of the nasal septum or orbit. No other facial fracture identified. Orbits: Negative for orbital fracture.  No orbital mass or edema. Sinuses: Clear Soft tissues: Soft tissue hematoma left frontal scalp anteriorly just above the orbit. This is central and left-sided. CT CERVICAL SPINE FINDINGS Alignment: Straightening of the cervical lordosis. Mild anterolisthesis C3-4 Skull base and vertebrae: Negative for fracture Soft tissues and spinal canal: Negative for soft tissue swelling or mass. Disc levels: Disc degeneration and spurring most prominent at C5-6. Facet degeneration most prominent on the left at C2-3 and C3-4 Upper chest: Lung apices clear bilaterally. Other: None IMPRESSION: 1. No acute intracranial abnormality 2. Nasal bone fracture which appears acute. Central and left-sided scalp hematoma left forehead. 3. Negative for cervical spine fracture. Electronically Signed   By: Franchot Gallo M.D.   On: 02/28/2021 11:55    Assessment & Plan:   Antonia was seen today for hypertension, hypothyroidism and diabetes.  Diagnoses and all orders for this visit:  Ataxia -     Ambulatory referral to Neurology  Paroxysmal atrial fibrillation (HCC)-She has good rate and rhythm control. -     TSH; Future -     TSH  Essential hypertension, benign- Her blood pressure is overcontrolled.  Will discontinue spironolactone. -     Basic metabolic panel; Future -     CBC with Differential/Platelet; Future -     Urinalysis, Routine w reflex microscopic; Future -     Urinalysis, Routine w reflex microscopic -     CBC with Differential/Platelet -     Basic metabolic panel  NASH (nonalcoholic steatohepatitis)- She will continue working on her lifestyle modifications. -      Hepatic function panel; Future -     Hepatic function panel  Hypothyroidism, unspecified type- Her TSH is in the normal range.  She will stay on the current dose of levothyroxine. -     TSH; Future -     TSH  Type II diabetes mellitus with manifestations ([place])- Her blood sugar is adequately well controlled. -     Basic metabolic panel; Future -     Hemoglobin A1c; Future -  Microalbumin / creatinine urine ratio; Future -     Microalbumin / creatinine urine ratio -     Hemoglobin A1c -     Basic metabolic panel  Need for prophylactic vaccination with combined diphtheria-tetanus-pertussis (DTP) vaccine -     Tdap (BOOSTRIX) 5-2.5-18.5 LF-MCG/0.5 injection; Inject 0.5 mLs into the muscle once for 1 dose.  Need for shingles vaccine -     Zoster Vaccine Adjuvanted [facility]) injection; Inject 0.5 mLs into the muscle once for 1 dose.  Vitamin B 12 deficiency -     CBC with Differential/Platelet; Future -     Folate; Future -     Folate -     CBC with Differential/Platelet -     cyanocobalamin ((VITAMIN B-12)) injection 1,000 mcg   I have discontinued Charlyn A. Barrack's spironolactone, dextromethorphan-guaiFENesin, Fluticasone-Umeclidin-Vilant, predniSONE, and oxyCODONE-acetaminophen. I am also having her start on Shingrix and Boostrix. Additionally, I am having her maintain her amiodarone, hydrochlorothiazide, atorvastatin, apixaban, levothyroxine, metFORMIN, metoprolol succinate, PARoxetine, zolpidem, albuterol, and FreeStyle Libre 3 Sensor. We administered cyanocobalamin.  Meds ordered this encounter  Medications   Zoster Vaccine Adjuvanted [facility]) injection    Sig: Inject 0.5 mLs into the muscle once for 1 dose.    Dispense:  0.5 mL    Refill:  1   Tdap (BOOSTRIX) 5-2.5-18.5 LF-MCG/0.5 injection    Sig: Inject 0.5 mLs into the muscle once for 1 dose.    Dispense:  0.5 mL    Refill:  0   cyanocobalamin ((VITAMIN B-12)) injection 1,000 mcg     Follow-up: Return in about  3 months (around 06/19/2021).  [name], MD

## 2021-03-22 NOTE — Patient Instructions (Signed)
Type 2 Diabetes Mellitus, Diagnosis, Adult ?Type 2 diabetes (type 2 diabetes mellitus) is a long-term, or chronic, disease. In type 2 diabetes, one or both of these problems may be present: ?The pancreas does not make enough of a hormone called insulin. ?Cells in the body do not respond properly to the insulin that the body makes (insulin resistance). ?Normally, insulin allows blood sugar (glucose) to enter cells in the body. The cells use glucose for energy. Insulin resistance or lack of insulin causes excess glucose to build up in the blood instead of going into cells. This causes high blood glucose (hyperglycemia).  ?What are the causes? ?The exact cause of type 2 diabetes is not known. ?What increases the risk? ?The following factors may make you more likely to develop this condition: ?Having a family member with type 2 diabetes. ?Being overweight or obese. ?Being inactive (sedentary). ?Having been diagnosed with insulin resistance. ?Having a history of prediabetes, diabetes when you were pregnant (gestational diabetes), or polycystic ovary syndrome (PCOS). ?What are the signs or symptoms? ?In the early stage of this condition, you may not have symptoms. Symptoms develop slowly and may include: ?Increased thirst or hunger. ?Increased urination. ?Unexplained weight loss. ?Tiredness (fatigue) or weakness. ?Vision changes, such as blurry vision. ?Dark patches on the skin. ?How is this diagnosed? ?This condition is diagnosed based on your symptoms, your medical history, a physical exam, and your blood glucose level. Your blood glucose may be checked with one or more of the following blood tests: ?A fasting blood glucose (FBG) test. You will not be allowed to eat (you will fast) for 8 hours or longer before a blood sample is taken. ?A random blood glucose test. This test checks blood glucose at any time of day regardless of when you ate. ?An A1C (hemoglobin A1C) blood test. This test provides information about blood  glucose levels over the previous 2-3 months. ?An oral glucose tolerance test (OGTT). This test measures your blood glucose at two times: ?After fasting. This is your baseline blood glucose level. ?Two hours after drinking a beverage that contains glucose. ?You may be diagnosed with type 2 diabetes if: ?Your fasting blood glucose level is 126 mg/dL (7.0 mmol/L) or higher. ?Your random blood glucose level is 200 mg/dL (11.1 mmol/L) or higher. ?Your A1C level is 6.5% or higher. ?Your oral glucose tolerance test result is higher than 200 mg/dL (11.1 mmol/L). ?These blood tests may be repeated to confirm your diagnosis. ?How is this treated? ?Your treatment may be managed by a specialist called an endocrinologist. Type 2 diabetes may be treated by following instructions from your health care provider about: ?Making dietary and lifestyle changes. These may include: ?Following a personalized nutrition plan that is developed by a registered dietitian. ?Exercising regularly. ?Finding ways to manage stress. ?Checking your blood glucose level as often as told. ?Taking diabetes medicines or insulin daily. This helps to keep your blood glucose levels in the healthy range. ?Taking medicines to help prevent complications from diabetes. Medicines may include: ?Aspirin. ?Medicine to lower cholesterol. ?Medicine to control blood pressure. ?Your health care provider will set treatment goals for you. Your goals will be based on your age, other medical conditions you have, and how you respond to diabetes treatment. Generally, the goal of treatment is to maintain the following blood glucose levels: ?Before meals: 80-130 mg/dL (4.4-7.2 mmol/L). ?After meals: below 180 mg/dL (10 mmol/L). ?A1C level: less than 7%. ?Follow these instructions at home: ?Questions to ask your health care provider ?  Consider asking the following questions: ?Should I meet with a certified diabetes care and education specialist? ?What diabetes medicines do I need,  and when should I take them? ?What equipment will I need to manage my diabetes at home? ?How often do I need to check my blood glucose? ?Where can I find a support group for people with diabetes? ?What number can I call if I have questions? ?When is my next appointment? ?General instructions ?Take over-the-counter and prescription medicines only as told by your health care provider. ?Keep all follow-up visits. This is important. ?Where to find more information ?For help and guidance and for more information about diabetes, please visit: ?American Diabetes Association (ADA): www.diabetes.org ?American Association of Diabetes Care and Education Specialists (ADCES): www.diabeteseducator.org ?International Diabetes Federation (IDF): www.idf.org ?Contact a health care provider if: ?Your blood glucose is at or above 240 mg/dL (13.3 mmol/L) for 2 days in a row. ?You have been sick or have had a fever for 2 days or longer, and you are not getting better. ?You have any of the following problems for more than 6 hours: ?You cannot eat or drink. ?You have nausea and vomiting. ?You have diarrhea. ?Get help right away if: ?You have severe hypoglycemia. This means your blood glucose is lower than 54 mg/dL (3.0 mmol/L). ?You become confused or you have trouble thinking clearly. ?You have difficulty breathing. ?You have moderate or large ketone levels in your urine. ?These symptoms may represent a serious problem that is an emergency. Do not wait to see if the symptoms will go away. Get medical help right away. Call your local emergency services (911 in the [place] Do not drive yourself to the hospital. ?Summary ?Type 2 diabetes mellitus is a long-term, or chronic, disease. In type 2 diabetes, the pancreas does not make enough of a hormone called insulin, or cells in the body do not respond properly to insulin that the body makes. ?This condition is treated by making dietary and lifestyle changes and taking diabetes medicines or  insulin. ?Your health care provider will set treatment goals for you. Your goals will be based on your age, other medical conditions you have, and how you respond to diabetes treatment. ?Keep all follow-up visits. This is important. ?This information is not intended to replace advice given to you by your health care provider. Make sure you discuss any questions you have with your health care provider. ?Document Revised: 04/25/2020 Document Reviewed: 04/25/2020 ?Elsevier Patient Education ? 2022 [employer] ? ?

## 2021-03-23 ENCOUNTER — Ambulatory Visit (INDEPENDENT_AMBULATORY_CARE_PROVIDER_SITE_OTHER): Payer: PPO | Admitting: Pulmonary Disease

## 2021-03-23 ENCOUNTER — Encounter: Payer: Self-pay | Admitting: Internal Medicine

## 2021-03-23 ENCOUNTER — Encounter: Payer: Self-pay | Admitting: Pulmonary Disease

## 2021-03-23 VITALS — BP 124/78 | HR 74 | Temp 97.8°F | Ht 67.0 in | Wt 194.0 lb

## 2021-03-23 DIAGNOSIS — J4520 Mild intermittent asthma, uncomplicated: Secondary | ICD-10-CM | POA: Diagnosis not present

## 2021-03-23 DIAGNOSIS — G4733 Obstructive sleep apnea (adult) (pediatric): Secondary | ICD-10-CM | POA: Diagnosis not present

## 2021-03-23 DIAGNOSIS — Z9989 Dependence on other enabling machines and devices: Secondary | ICD-10-CM

## 2021-03-23 LAB — MICROALBUMIN / CREATININE URINE RATIO
Creatinine,U: 86.7 mg/dL
Microalb Creat Ratio: 0.9 mg/g (ref 0.0–30.0)
Microalb, Ur: 0.8 mg/dL (ref 0.0–1.9)

## 2021-03-23 NOTE — Patient Instructions (Signed)
Will arrange for new CPAP machine  Follow up in 5 months

## 2021-03-23 NOTE — Progress Notes (Signed)
[facility]  Chief Complaint  Patient presents with   Follow-up    Hasn't worn cpap in last month due to broken nose.     Past Surgical History:  She  has a past surgical history that includes Cholecystectomy (1992); Tubal ligation (1988); Newborn Surgery - GI - ORGANS OUTSIDE ABDOMEN; Small Bowel Repair; Cesarean section; Breast excisional biopsy (Left, 03/19/2012); and Colonoscopy (2011).  Past Medical History:  A fib, Anxiety, GERD, HTN, HLD, IBS, SBO, DM type 2, Eventration of Rt diaphragm  Constitutional:  BP 124/78 (BP Location: Left Arm, Cuff Size: Normal)    Pulse 74    Temp 97.8 F (36.6 C) (Temporal)    Ht 5' 7"  (1.702 m)    Wt 194 lb (88 kg)    SpO2 96%    BMI 30.38 kg/m   Brief Summary:  Kirsten [name] is a 66 y.o. female with obstructive sleep apnea and asthma.      Subjective:   She had a sleep study in 2010.  This showed severe sleep apnea.  She has been using CPAP.  She tripped and fell in January.  She suffered a nasal fracture.  She wasn't able to use CPAP after this.  She thinks she will be ready to resume using CPAP in a couple of weeks.  Prior to this she used CPAP on a regular basis w/o issues.  Her machine is more than 5 yrs and old and she was told she is eligible for a new machine.  She is not having cough, wheeze, or sputum.  Hasn't needed her albuterol for a while.  Physical Exam:   Appearance - well kempt   ENMT - no sinus tenderness, no oral exudate, no LAN, Mallampati 4 airway, no stridor, ecchymosis around left eye and under her chin  Respiratory - equal breath sounds bilaterally, no wheezing or rales  CV - s1s2 regular rate and rhythm, no murmurs  Ext - no clubbing, no edema, Lt wrist in a brace  Skin - no rashes  Psych - normal mood and affect   Pulmonary testing:  PFT 09/20/09 >> FEV1 1.55 (57%), FEV1% 83, TLC 3.00 (54%), DLCO 58% RAST 11/02/15 >> negative IgE 10/28/20 >> 3  Sleep Tests:   PSG 07/22/08 >> AHI 80.4, SpO2 low 82% CPAP 02/22/21 to 02/27/21 >> used on 6 of 6 nights with average 7 hrs 3 min.  Average AHI 0.7 with CPAP 8 cm H2O  Cardiac Tests:  Echo 12/12/20 >> EF greater than 55%, mild LVH, grade 2 DD  Social History:  She  reports that she has never smoked. She has never used smokeless tobacco. She reports current alcohol use of about 2.0 standard drinks per week. She reports that she does not use drugs.  Family History:  Her family history includes Colon cancer in her father; Diabetes in her maternal grandmother; Diabetes type I in her son; Diabetes type II in her father; Lymphoma in her mother.     Assessment/Plan:   Obstructive sleep apnea. - she is compliant with CPAP and reports benefit from therapy - she uses [employer] for her DME - her machine is more then 64 yrs old - will arrange for new Resmed CPAP at 8 cm H2O  Mild, intermittent asthma. - prn albuterol  Time Spent Involved in Patient Care on Day of Examination:  28 minutes  Follow up:   Patient Instructions  Will arrange for new CPAP machine  Follow up  in 5 months  Medication List:   Allergies as of 03/23/2021       Reactions   Doxycycline Rash   Factive [gemifloxacin Mesylate] Rash   Crestor [rosuvastatin]    GI upset   Sulfonamide Derivatives    REACTION: rash   Gemifloxacin Rash   Penicillins Hives, Rash   Has patient had a PCN reaction causing immediate rash, facial/tongue/throat swelling, SOB or lightheadedness with hypotension: No Has patient had a PCN reaction causing severe rash involving mucus membranes or skin necrosis: No Has patient had a PCN reaction that required hospitalization No Has patient had a PCN reaction occurring within the last 10 years: No If all of the above answers are "NO", then may proceed with Cephalosporin use.        Medication List        Accurate as of March 23, 2021 11:48 AM. If you have any questions, ask your nurse or doctor.           albuterol 108 (90 Base) MCG/ACT inhaler Commonly known as: VENTOLIN HFA Inhale 2 puffs into the lungs every 6 (six) hours as needed for wheezing or shortness of breath.   amiodarone 200 MG tablet Commonly known as: PACERONE Take 1 tablet (200 mg total) by mouth daily.   apixaban 5 MG Tabs tablet Commonly known as: Eliquis Take 1 tablet (5 mg total) by mouth 2 (two) times daily.   atorvastatin 40 MG tablet Commonly known as: LIPITOR TAKE 1 TABLET(40 MG) BY MOUTH DAILY   FreeStyle Libre 3 Sensor Misc 1 Act by Does not apply route daily.   hydrochlorothiazide 25 MG tablet Commonly known as: HYDRODIURIL Take 1 tablet by mouth daily.   levothyroxine 150 MCG tablet Commonly known as: SYNTHROID Take 1 tablet (150 mcg total) by mouth daily.   metFORMIN 1000 MG tablet Commonly known as: GLUCOPHAGE One po BID   metoprolol succinate 50 MG 24 hr tablet Commonly known as: TOPROL-XL Take 1 tablet (50 mg total) by mouth daily.   PARoxetine 25 MG 24 hr tablet Commonly known as: PAXIL-CR TAKE 1 TABLET(25 MG) BY MOUTH DAILY   zolpidem 10 MG tablet Commonly known as: AMBIEN Take 1 tablet (10 mg total) by mouth at bedtime as needed for sleep.        Signature:  Chesley Mires, MD [place] Pager - [phone] 03/23/2021, 11:48 AM

## 2021-03-28 ENCOUNTER — Encounter: Payer: Self-pay | Admitting: Neurology

## 2021-03-29 DIAGNOSIS — S63502A Unspecified sprain of left wrist, initial encounter: Secondary | ICD-10-CM | POA: Diagnosis not present

## 2021-04-09 DIAGNOSIS — J329 Chronic sinusitis, unspecified: Secondary | ICD-10-CM | POA: Diagnosis not present

## 2021-04-20 ENCOUNTER — Other Ambulatory Visit: Payer: Self-pay | Admitting: Internal Medicine

## 2021-04-20 DIAGNOSIS — E039 Hypothyroidism, unspecified: Secondary | ICD-10-CM

## 2021-05-05 DIAGNOSIS — H2511 Age-related nuclear cataract, right eye: Secondary | ICD-10-CM | POA: Diagnosis not present

## 2021-05-15 ENCOUNTER — Encounter: Payer: Self-pay | Admitting: Neurology

## 2021-05-15 ENCOUNTER — Ambulatory Visit: Payer: PPO | Admitting: Neurology

## 2021-05-15 VITALS — BP 132/83 | HR 71 | Ht 67.0 in | Wt 195.0 lb

## 2021-05-15 DIAGNOSIS — R2681 Unsteadiness on feet: Secondary | ICD-10-CM | POA: Diagnosis not present

## 2021-05-15 DIAGNOSIS — R202 Paresthesia of skin: Secondary | ICD-10-CM

## 2021-05-15 DIAGNOSIS — R292 Abnormal reflex: Secondary | ICD-10-CM | POA: Diagnosis not present

## 2021-05-15 NOTE — Progress Notes (Signed)
?[facility] ?Neurology Division ?Clinic Note - Initial Visit ? ? ?Date: 05/15/21 ? ?Kirsten [name] ?MRN: [id number] ?DOB: Jun 11, 1955 ? ? ?Dear Dr. Ronnald Ramp: ? ?Thank you for your kind referral of Kirsten [name] for consultation of falls. Although her history is well known to you, please allow [place] to reiterate it for the purpose of our medical record. The patient was accompanied to the clinic by self. ? ? ?History of Present Illness: ?Kirsten [name] is a 66 y.o. right-handed female with atrial fibrillation, GERD, hypertension, hyperlipidemia, diabetes mellitus, and anxiety presenting for evaluation of gait unsteadiness and falls.  Starting around 2021, she began having imbalance and sensation of weakness in her thighs and hips.   ?Her imbalance is worse with turns and stepping up.  She has fallen 4 times and requires assistance to stand up.  Her last fall was in January and suffered facial fractures.  She was evaluated by Dr. Kathyrn Sheriff at [facility] where she had MRI cervical, thoracic, and lumbar spine (reports not available).  According to patient, it showed arthritis, nothing to explain her symptoms.  She denies cramps or low back pain.  She has bilateral hip pain, which is achy.  She was evaluated by Hillery Hunter who offered steroid injection to SI joint which relieved her pain.  She has completed PT balance which did not help her balance.  She walks unassisted.  No numbness/tingling in the legs. Her labs shows chronic vitamin B12 deficiency.  She is not on supplements.  ? ?She also complains of an area of tingling and facial pain over the left forehead, which started after her fall in January 2023. She reports having a sizable bruise over the forehead which extended into the face.  ? ?She is retired from [employer].  She lives at home in a one-level home with her husband.  Nonsmoker, does not drink alcohol.  ? ?Out-side paper records, electronic medical record, and images  have been reviewed where available and summarized as:  ?Lab Results  ?Component Value Date  ? HGBA1C 6.6 (H) 03/22/2021  ? ?Lab Results  ?Component Value Date  ? VITAMINB12 180 (L) 10/18/2020  ? ?Lab Results  ?Component Value Date  ? TSH 1.24 03/22/2021  ? ?Lab Results  ?Component Value Date  ? ESRSEDRATE 29 10/18/2020  ? ? ?Past Medical History:  ?Diagnosis Date  ? A-fib ([place])   ? Anxiety disorder   ? Asthma   ? Dyspnea   ? 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 GERD (gastroesophageal reflux disease)   ? HTN (hypertension)   ? Hyperlipidemia   ? IBS (irritable bowel syndrome)   ? Morbid obesity ([place])   ? Target wt - 185  for BMI < 30  ? Obesity   ? OSA on CPAP   ? SBO (small bowel obstruction) ([place])   ? Resolved with NG/Bowel rest around 2009  ? Sleep apnea   ? Type II or unspecified type diabetes mellitus without mention of complication, not stated as uncontrolled   ? ? ?Past Surgical History:  ?Procedure Laterality Date  ? BREAST EXCISIONAL BIOPSY Left 03/19/2012  ? neg  ? CESAREAN SECTION    ? CHOLECYSTECTOMY  1992  ? COLONOSCOPY  2011  ? 2011-normal  ? Newborn Surgery - GI - ORGANS OUTSIDE ABDOMEN    ? Small Bowel Repair    ? TUBAL LIGATION  1988  ? ? ? ?Medications:  ?Outpatient Encounter Medications  as of 05/15/2021  ?Medication Sig  ? albuterol (VENTOLIN HFA) 108 (90 Base) MCG/ACT inhaler Inhale 2 puffs into the lungs every 6 (six) hours as needed for wheezing or shortness of breath.  ? amiodarone (PACERONE) 200 MG tablet Take 1 tablet (200 mg total) by mouth daily.  ? apixaban (ELIQUIS) 5 MG TABS tablet Take 1 tablet (5 mg total) by mouth 2 (two) times daily.  ? atorvastatin (LIPITOR) 40 MG tablet TAKE 1 TABLET(40 MG) BY MOUTH DAILY  ? Continuous Blood Gluc Sensor (FREESTYLE LIBRE 3 SENSOR) MISC 1 Act by Does not apply route daily.  ? levothyroxine (SYNTHROID) 150 MCG tablet TAKE ONE TABLET BY MOUTH ONE TIME DAILY  ? metFORMIN (GLUCOPHAGE) 1000 MG tablet One po BID  ?  metoprolol succinate (TOPROL-XL) 50 MG 24 hr tablet Take 1 tablet (50 mg total) by mouth daily.  ? PARoxetine (PAXIL-CR) 25 MG 24 hr tablet TAKE 1 TABLET(25 MG) BY MOUTH DAILY  ? zolpidem (AMBIEN) 10 MG tablet Take 1 tablet (10 mg total) by mouth at bedtime as needed for sleep.  ? hydrochlorothiazide (HYDRODIURIL) 25 MG tablet Take 1 tablet by mouth daily. (Patient not taking: Reported on 05/15/2021)  ? ?No facility-administered encounter medications on file as of 05/15/2021.  ? ? ?Allergies:  ?Allergies  ?Allergen Reactions  ? Doxycycline Rash  ? Factive [Gemifloxacin Mesylate] Rash  ? Crestor [Rosuvastatin]   ?  GI upset  ? Sulfonamide Derivatives   ?  REACTION: rash  ? Gemifloxacin Rash  ? Penicillins Hives and Rash  ?  Has patient had a PCN reaction causing immediate rash, facial/tongue/throat swelling, SOB or lightheadedness with hypotension: No ?Has patient had a PCN reaction causing severe rash involving mucus membranes or skin necrosis: No ?Has patient had a PCN reaction that required hospitalization No ?Has patient had a PCN reaction occurring within the last 10 years: No ?If all of the above answers are "NO", then may proceed with Cephalosporin use. ?  ? ? ?Family History: ?Family History  ?Problem Relation Age of Onset  ? Lymphoma Mother   ? Diabetes type II Father   ? Colon cancer Father   ? Diabetes Maternal Grandmother   ? Diabetes type I Son   ? Goiter Neg Hx   ? Breast cancer Neg Hx   ? Ovarian cancer Neg Hx   ? ? ?Social History: ?Social History  ? ?Tobacco Use  ? Smoking status: Never  ? Smokeless tobacco: Never  ? Tobacco comments:  ?  "tried as a teen"  ?Vaping Use  ? Vaping Use: Never used  ?Substance Use Topics  ? Alcohol use: Yes  ?  Alcohol/week: 2.0 standard drinks  ?  Types: 2 Glasses of wine per week  ?  Comment: rare  ? Drug use: No  ? ?Social History  ? ?Social History Narrative  ? Regular Exercise -  YES  ? Daily Caffeine Use:  2  ? Active and independent at baseline  ? Right handed  ?  Lives in a one story home  ?   ? ? ?Vital Signs:  ?BP 132/83   Pulse 71   Ht 5' 7"  (1.702 m)   Wt 195 lb (88.5 kg)   SpO2 95%   BMI 30.54 kg/m?  ? ?Neurological Exam: ?MENTAL STATUS including orientation to time, place, person, recent and remote memory, attention span and concentration, language, and fund of knowledge is normal.  Speech is not dysarthric. ? ?CRANIAL NERVES: ?II:  No visual field  defects.   ?III-IV-VI: Pupils equal round and reactive to light.  Normal conjugate, extra-ocular eye movements in all directions of gaze.  No nystagmus.  No ptosis.   ?V:  Normal facial sensation.    ?VII:  Normal facial symmetry and movements.   ?VIII:  Normal hearing and vestibular function.   ?IX-X:  Normal palatal movement.   ?XI:  Normal shoulder shrug and head rotation.   ?XII:  Normal tongue strength and range of motion, no deviation or fasciculation. ? ?MOTOR:  No atrophy, fasciculations or abnormal movements.  No pronator drift.  ? ?Upper Extremity:  Right  Left  ?Deltoid  5/5   5/5   ?Biceps  5/5   5/5   ?Triceps  5/5   5/5   ?Infraspinatus 5/5  5/5  ?Medial pectoralis 5/5  5/5  ?Wrist extensors  5/5   5/5   ?Wrist flexors  5/5   5/5   ?Finger extensors  5/5   5/5   ?Finger flexors  5/5   5/5   ?Dorsal interossei  5/5   5/5   ?Abductor pollicis  5/5   5/5   ?Tone (Ashworth scale)  0  0  ? ?Lower Extremity:  Right  Left  ?Hip flexors  5/5   5/5   ?Hip extensors  5/5   5/5   ?Adductor 5/5  5/5  ?Abductor 5/5  5/5  ?Knee flexors  5/5   5/5   ?Knee extensors  5/5   5/5   ?Dorsiflexors  5/5   5/5   ?Plantarflexors  5/5   5/5   ?Toe extensors  5/5   5/5   ?Toe flexors  5/5   5/5   ?Tone (Ashworth scale)  0  0  ? ?MSRs:  ?Right        Left                  ?brachioradialis 2+  2+  ?biceps 2+  2+  ?triceps 2+  2+  ?patellar 3+  3+  ?ankle jerk 2+  2+  ?Hoffman no  no  ?plantar response down  down  ? ?SENSORY:  Normal and symmetric perception of light touch, pinprick, vibration, and proprioception.  Romberg's sign  absent.  ? ?COORDINATION/GAIT: Normal finger-to- nose-finger and heel-to-shin.  Intact rapid alternating movements bilaterally.  Gait mildly wide- based and stable. Tandem and stressed gait intact.  ? ? ?IMPRESSION: ?Gait

## 2021-05-15 NOTE — Patient Instructions (Addendum)
We will request MRI reports of your spine ? ?MRI brain without contrast ? ?Check labs ? ? ?

## 2021-05-16 ENCOUNTER — Other Ambulatory Visit (INDEPENDENT_AMBULATORY_CARE_PROVIDER_SITE_OTHER): Payer: PPO

## 2021-05-16 DIAGNOSIS — R2681 Unsteadiness on feet: Secondary | ICD-10-CM | POA: Diagnosis not present

## 2021-05-16 DIAGNOSIS — R202 Paresthesia of skin: Secondary | ICD-10-CM

## 2021-05-16 DIAGNOSIS — R292 Abnormal reflex: Secondary | ICD-10-CM | POA: Diagnosis not present

## 2021-05-16 LAB — B12 AND FOLATE PANEL
Folate: >24.2 ng/mL (ref >5.9)
Vitamin B-12: 135 pg/mL — ABNORMAL LOW (ref 211–911)

## 2021-05-17 ENCOUNTER — Encounter: Payer: Self-pay | Admitting: Neurology

## 2021-05-18 ENCOUNTER — Other Ambulatory Visit: Payer: Self-pay

## 2021-05-22 DIAGNOSIS — D23122 Other benign neoplasm of skin of left lower eyelid, including canthus: Secondary | ICD-10-CM | POA: Diagnosis not present

## 2021-05-22 MED ORDER — B-12 COMPLIANCE INJECTION 1000 MCG/ML IJ KIT: PACK | INTRAMUSCULAR | 0 refills | Status: DC

## 2021-05-23 LAB — VITAMIN B1: Vitamin B1 (Thiamine): 46 nmol/L — ABNORMAL HIGH (ref 8–30)

## 2021-05-23 LAB — COPPER, SERUM: Copper: 114 ug/dL (ref 70–175)

## 2021-05-23 LAB — ZINC: Zinc: 93 ug/dL (ref 60–130)

## 2021-05-26 ENCOUNTER — Ambulatory Visit
Admission: RE | Admit: 2021-05-26 | Discharge: 2021-05-26 | Disposition: A | Discharge status: Home / Self Care | Payer: PPO | Source: Ambulatory Visit | Attending: Neurology | Admitting: Neurology

## 2021-05-26 ENCOUNTER — Telehealth: Payer: Self-pay | Admitting: Neurology

## 2021-05-26 DIAGNOSIS — R519 Headache, unspecified: Secondary | ICD-10-CM | POA: Diagnosis not present

## 2021-05-26 DIAGNOSIS — R2681 Unsteadiness on feet: Secondary | ICD-10-CM

## 2021-05-26 DIAGNOSIS — R202 Paresthesia of skin: Secondary | ICD-10-CM | POA: Diagnosis not present

## 2021-05-26 DIAGNOSIS — R292 Abnormal reflex: Secondary | ICD-10-CM

## 2021-05-26 DIAGNOSIS — R531 Weakness: Secondary | ICD-10-CM | POA: Diagnosis not present

## 2021-05-26 NOTE — Telephone Encounter (Signed)
Patient called and said she missed a call from our office. ?

## 2021-06-14 DIAGNOSIS — G4733 Obstructive sleep apnea (adult) (pediatric): Secondary | ICD-10-CM | POA: Diagnosis not present

## 2021-06-19 DIAGNOSIS — G4733 Obstructive sleep apnea (adult) (pediatric): Secondary | ICD-10-CM | POA: Diagnosis not present

## 2021-06-19 DIAGNOSIS — M461 Sacroiliitis, not elsewhere classified: Secondary | ICD-10-CM | POA: Diagnosis not present

## 2021-06-26 ENCOUNTER — Other Ambulatory Visit: Payer: Self-pay | Admitting: Neurology

## 2021-07-07 DIAGNOSIS — H2512 Age-related nuclear cataract, left eye: Secondary | ICD-10-CM | POA: Diagnosis not present

## 2021-07-10 ENCOUNTER — Other Ambulatory Visit: Payer: Self-pay | Admitting: Internal Medicine

## 2021-07-10 DIAGNOSIS — E785 Hyperlipidemia, unspecified: Secondary | ICD-10-CM

## 2021-07-10 DIAGNOSIS — E118 Type 2 diabetes mellitus with unspecified complications: Secondary | ICD-10-CM

## 2021-07-12 DIAGNOSIS — G4730 Sleep apnea, unspecified: Secondary | ICD-10-CM | POA: Diagnosis not present

## 2021-07-12 DIAGNOSIS — I10 Essential (primary) hypertension: Secondary | ICD-10-CM | POA: Diagnosis not present

## 2021-07-12 DIAGNOSIS — R002 Palpitations: Secondary | ICD-10-CM | POA: Diagnosis not present

## 2021-07-12 DIAGNOSIS — I2510 Atherosclerotic heart disease of native coronary artery without angina pectoris: Secondary | ICD-10-CM | POA: Diagnosis not present

## 2021-07-12 DIAGNOSIS — I4891 Unspecified atrial fibrillation: Secondary | ICD-10-CM | POA: Diagnosis not present

## 2021-07-12 DIAGNOSIS — I340 Nonrheumatic mitral (valve) insufficiency: Secondary | ICD-10-CM | POA: Diagnosis not present

## 2021-07-12 DIAGNOSIS — E669 Obesity, unspecified: Secondary | ICD-10-CM | POA: Diagnosis not present

## 2021-07-13 DIAGNOSIS — M461 Sacroiliitis, not elsewhere classified: Secondary | ICD-10-CM | POA: Diagnosis not present

## 2021-07-13 DIAGNOSIS — Z6830 Body mass index (BMI) 30.0-30.9, adult: Secondary | ICD-10-CM | POA: Diagnosis not present

## 2021-07-15 DIAGNOSIS — G4733 Obstructive sleep apnea (adult) (pediatric): Secondary | ICD-10-CM | POA: Diagnosis not present

## 2021-07-19 ENCOUNTER — Other Ambulatory Visit: Payer: Self-pay | Admitting: Internal Medicine

## 2021-07-19 DIAGNOSIS — E039 Hypothyroidism, unspecified: Secondary | ICD-10-CM

## 2021-07-19 DIAGNOSIS — G4700 Insomnia, unspecified: Secondary | ICD-10-CM

## 2021-07-19 DIAGNOSIS — F409 Phobic anxiety disorder, unspecified: Secondary | ICD-10-CM

## 2021-07-20 ENCOUNTER — Encounter: Payer: Self-pay | Admitting: Internal Medicine

## 2021-07-20 ENCOUNTER — Ambulatory Visit (INDEPENDENT_AMBULATORY_CARE_PROVIDER_SITE_OTHER): Payer: PPO | Admitting: Internal Medicine

## 2021-07-20 VITALS — BP 130/70 | HR 66 | Temp 98.0°F | Resp 16 | Ht 67.0 in | Wt 192.0 lb

## 2021-07-20 DIAGNOSIS — E039 Hypothyroidism, unspecified: Secondary | ICD-10-CM | POA: Diagnosis not present

## 2021-07-20 DIAGNOSIS — E785 Hyperlipidemia, unspecified: Secondary | ICD-10-CM | POA: Diagnosis not present

## 2021-07-20 DIAGNOSIS — F418 Other specified anxiety disorders: Secondary | ICD-10-CM | POA: Diagnosis not present

## 2021-07-20 DIAGNOSIS — N3000 Acute cystitis without hematuria: Secondary | ICD-10-CM | POA: Diagnosis not present

## 2021-07-20 DIAGNOSIS — E118 Type 2 diabetes mellitus with unspecified complications: Secondary | ICD-10-CM | POA: Diagnosis not present

## 2021-07-20 DIAGNOSIS — I10 Essential (primary) hypertension: Secondary | ICD-10-CM

## 2021-07-20 DIAGNOSIS — R300 Dysuria: Secondary | ICD-10-CM

## 2021-07-20 DIAGNOSIS — I480 Paroxysmal atrial fibrillation: Secondary | ICD-10-CM

## 2021-07-20 DIAGNOSIS — E538 Deficiency of other specified B group vitamins: Secondary | ICD-10-CM

## 2021-07-20 DIAGNOSIS — F409 Phobic anxiety disorder, unspecified: Secondary | ICD-10-CM

## 2021-07-20 DIAGNOSIS — R3915 Urgency of urination: Secondary | ICD-10-CM

## 2021-07-20 DIAGNOSIS — F5105 Insomnia due to other mental disorder: Secondary | ICD-10-CM | POA: Diagnosis not present

## 2021-07-20 DIAGNOSIS — G4700 Insomnia, unspecified: Secondary | ICD-10-CM

## 2021-07-20 DIAGNOSIS — Z23 Encounter for immunization: Secondary | ICD-10-CM

## 2021-07-20 DIAGNOSIS — G4730 Sleep apnea, unspecified: Secondary | ICD-10-CM

## 2021-07-20 LAB — LIPID PANEL
Cholesterol: 146 mg/dL (ref 0–200)
HDL: 53.7 mg/dL (ref >39.00)
LDL Cholesterol: 66 mg/dL (ref 0–99)
NonHDL: 91.8
Total CHOL/HDL Ratio: 3
Triglycerides: 127 mg/dL (ref 0.0–149.0)
VLDL: 25.4 mg/dL (ref 0.0–40.0)

## 2021-07-20 LAB — URINALYSIS, ROUTINE W REFLEX MICROSCOPIC
Hgb urine dipstick: NEGATIVE
Ketones, ur: NEGATIVE
Nitrite: NEGATIVE
RBC / HPF: NONE SEEN (ref 0–?)
Specific Gravity, Urine: 1.025 (ref 1.000–1.030)
Total Protein, Urine: NEGATIVE
Urine Glucose: NEGATIVE
Urobilinogen, UA: 0.2 (ref 0.0–1.0)
pH: 6 (ref 5.0–8.0)

## 2021-07-20 LAB — TSH: TSH: 5.75 u[IU]/mL — ABNORMAL HIGH (ref 0.35–5.50)

## 2021-07-20 LAB — HEMOGLOBIN A1C: Hgb A1c MFr Bld: 6.8 % — ABNORMAL HIGH (ref 4.6–6.5)

## 2021-07-20 MED ORDER — NITROFURANTOIN MONOHYD MACRO 100 MG PO CAPS: 100.0000 mg | ORAL_CAPSULE | Freq: Two times a day (BID) | ORAL | 0 refills | Status: AC

## 2021-07-20 MED ORDER — APIXABAN 5 MG PO TABS: 5.0000 mg | ORAL_TABLET | Freq: Two times a day (BID) | ORAL | 1 refills | Status: DC

## 2021-07-20 MED ORDER — PAROXETINE HCL ER 25 MG PO TB24: ORAL_TABLET | ORAL | 1 refills | Status: DC

## 2021-07-20 MED ORDER — ZOLPIDEM TARTRATE 10 MG PO TABS: 10.0000 mg | ORAL_TABLET | Freq: Every evening | ORAL | 0 refills | Status: DC | PRN

## 2021-07-20 MED ORDER — BOOSTRIX 5-2.5-18.5 LF-MCG/0.5 IM SUSP: 0.5000 mL | Freq: Once | INTRAMUSCULAR | 0 refills | Status: AC

## 2021-07-20 MED ORDER — ATORVASTATIN CALCIUM 40 MG PO TABS: ORAL_TABLET | ORAL | 1 refills | Status: DC

## 2021-07-20 MED ORDER — LEVOTHYROXINE SODIUM 175 MCG PO TABS: 175.0000 ug | ORAL_TABLET | Freq: Every day | ORAL | 1 refills | Status: DC

## 2021-07-20 NOTE — Progress Notes (Unsigned)
Established Patient Office Visit  Subjective   Patient ID: Kirsten [name], female    DOB: September 16, 1955  Age: 66 y.o. MRN: [id number]  Chief Complaint  Patient presents with   Urinary Tract Infection   Hypertension   Diabetes   Hypothyroidism    HPI - Low blood sugars  Patient reports low blood sugar notifications at night in the 60's. She states that she is asymptomatic and has decreased her metformin dose. - B12 levels low Patient stated that she has been getting B12 injections from her neurologist over the last month and has felt great and would like her levels checked today. - Urinary urgency (pressure) Patient reports urinary frequency, urgency and abdominal pressure for 1 week. She believes she has a UTI and would like her urine checked as well. -Health maintenance - discussed Shingles vaccine and Tdap due. Patient declines the Shingles vaccine at this time.  {History (Optional):23778}  Review of Systems  Constitutional:  Negative for fever.  Eyes:  Positive for blurred vision (recent cataract surgery).  Respiratory:  Negative for shortness of breath.   Cardiovascular:  Negative for chest pain.  Gastrointestinal:  Negative for abdominal pain, constipation, diarrhea, nausea and vomiting.  Genitourinary:  Positive for frequency and urgency. Negative for dysuria, flank pain and hematuria.  Neurological:  Negative for dizziness.      Objective:     BP 130/70 (BP Location: Left Arm, Patient Position: Sitting, Cuff Size: Large)   Pulse 66   Temp 98 F (36.7 C) (Oral)   Resp 16   Ht 5' 7"  (1.702 m)   Wt 192 lb (87.1 kg)   SpO2 93%   BMI 30.07 kg/m  BP Readings from Last 3 Encounters:  07/20/21 130/70  05/15/21 132/83  03/23/21 124/78   Wt Readings from Last 3 Encounters:  07/20/21 192 lb (87.1 kg)  05/15/21 195 lb (88.5 kg)  03/23/21 194 lb (88 kg)      Physical Exam Vitals reviewed. Exam conducted with a chaperone present.  Constitutional:       Appearance: Normal appearance.  HENT:     Head: Normocephalic.     Nose: Nose normal.  Cardiovascular:     Rate and Rhythm: Normal rate and regular rhythm.     Heart sounds: Normal heart sounds.  Pulmonary:     Effort: Pulmonary effort is normal.     Breath sounds: Normal breath sounds.  Abdominal:     General: Bowel sounds are normal.     Palpations: Abdomen is soft.     Tenderness: There is no abdominal tenderness.  Skin:    General: Skin is warm.  Neurological:     Mental Status: She is alert.  Psychiatric:        Mood and Affect: Mood normal.        Behavior: Behavior normal.      Lab Results  Component Value Date   WBC 8.3 03/22/2021   HGB 13.0 03/22/2021   HCT 40.2 03/22/2021   PLT 317.0 03/22/2021   GLUCOSE 116 (H) 03/22/2021   CHOL 146 07/20/2021   TRIG 127.0 07/20/2021   HDL 53.70 07/20/2021   LDLDIRECT 63.0 01/15/2019   LDLCALC 66 07/20/2021   ALT 62 (H) 03/22/2021   AST 59 (H) 03/22/2021   NA 142 03/22/2021   K 4.1 03/22/2021   CL 102 03/22/2021   CREATININE 0.82 03/22/2021   BUN 16 03/22/2021   CO2 30 03/22/2021   TSH 5.75 (H) 07/20/2021  INR 1.4 (H) 06/02/2019   HGBA1C 6.8 (H) 07/20/2021   MICROALBUR 0.8 03/22/2021     Results for orders placed or performed in visit on 07/20/21  Urinalysis, Routine w reflex microscopic  Result Value Ref Range   Color, Urine YELLOW Yellow;Lt. Yellow;Straw;Dark Yellow;Amber;Green;Red;Brown   APPearance Sl Cloudy (A) Clear;Turbid;Slightly Cloudy;Cloudy   Specific Gravity, Urine 1.025 1.000 - 1.030   pH 6.0 5.0 - 8.0   Total Protein, Urine NEGATIVE Negative   Urine Glucose NEGATIVE Negative   Ketones, ur NEGATIVE Negative   Bilirubin Urine SMALL (A) Negative   Hgb urine dipstick NEGATIVE Negative   Urobilinogen, UA 0.2 0.0 - 1.0   Leukocytes,Ua SMALL (A) Negative   Nitrite NEGATIVE Negative   WBC, UA 21-50/hpf (A) 0-2/hpf   RBC / HPF none seen 0-2/hpf   Mucus, UA Presence of (A) None   Squamous Epithelial  / LPF Rare(0-4/hpf) Rare(0-4/hpf)   Bacteria, UA Few(10-50/hpf) (A) None  Hemoglobin A1c  Result Value Ref Range   Hgb A1c MFr Bld 6.8 (H) 4.6 - 6.5 %  TSH  Result Value Ref Range   TSH 5.75 (H) 0.35 - 5.50 uIU/mL  Lipid panel  Result Value Ref Range   Cholesterol 146 0 - 200 mg/dL   Triglycerides 127.0 0.0 - 149.0 mg/dL   HDL 53.70 >39.00 mg/dL   VLDL 25.4 0.0 - 40.0 mg/dL   LDL Cholesterol 66 0 - 99 mg/dL   Total CHOL/HDL Ratio 3    NonHDL 91.80     Last hemoglobin A1c Lab Results  Component Value Date   HGBA1C 6.8 (H) 07/20/2021   Last thyroid functions Lab Results  Component Value Date   TSH 5.75 (H) 07/20/2021   T4TOTAL 15.1 (H) 09/10/2016   Last vitamin D Lab Results  Component Value Date   VD25OH 28.27 (L) 10/18/2020   Last vitamin B12 and Folate Lab Results  Component Value Date   VITAMINB12 135 (L) 05/16/2021   FOLATE >24.2 05/16/2021      The 10-year ASCVD risk score (Arnett DK, et al., 2019) is: 9.8%    Assessment & Plan:   Problem List Items Addressed This Visit     Type II diabetes mellitus with manifestations ([place])    Patient was reassured that her BS warnings are fine since she was asymptomatic when they occurred.       Relevant Medications   atorvastatin (LIPITOR) 40 MG tablet   Other Relevant Orders   Hemoglobin A1c (Completed)   Vitamin B 12 deficiency    Patient has been getting B12 injections over the last month, will check B12 today      Relevant Orders   Vitamin B12   Essential hypertension, benign - Primary   Relevant Medications   apixaban (ELIQUIS) 5 MG TABS tablet   atorvastatin (LIPITOR) 40 MG tablet   Other Relevant Orders   Urinalysis, Routine w reflex microscopic (Completed)   Hypothyroidism   Relevant Orders   TSH (Completed)   Depression with anxiety   Relevant Medications   PARoxetine (PAXIL-CR) 25 MG 24 hr tablet   Hyperlipidemia with target LDL less than 100   Relevant Medications   apixaban (ELIQUIS) 5  MG TABS tablet   atorvastatin (LIPITOR) 40 MG tablet   Other Relevant Orders   Lipid panel (Completed)   Paroxysmal atrial fibrillation (H[facility])   Relevant Medications   apixaban (ELIQUIS) 5 MG TABS tablet   atorvastatin (LIPITOR) 40 MG tablet   Insomnia due to anxiety and fear  Relevant Medications   zolpidem (AMBIEN) 10 MG tablet   Insomnia w/ sleep apnea   Relevant Medications   zolpidem (AMBIEN) 10 MG tablet   Urinary urgency   Need for prophylactic vaccination with combined diphtheria-tetanus-pertussis (DTP) vaccine   Relevant Medications   Tdap (BOOSTRIX) 5-2.5-18.5 LF-MCG/0.5 injection   Acute cystitis without hematuria   Relevant Medications   nitrofurantoin, macrocrystal-monohydrate, (MACROBID) 100 MG capsule   Other Visit Diagnoses     Dysuria       Relevant Orders   Urinalysis, Routine w reflex microscopic (Completed)   CULTURE, URINE COMPREHENSIVE       Return in about 6 months (around 01/19/2022).    [name], MD

## 2021-07-20 NOTE — Patient Instructions (Signed)

## 2021-07-20 NOTE — Assessment & Plan Note (Signed)
Patient was reassured that her BS warnings are fine since she was asymptomatic when they occurred.

## 2021-07-20 NOTE — Assessment & Plan Note (Deleted)
UA with culture ordered

## 2021-07-20 NOTE — Assessment & Plan Note (Addendum)
Patient has been getting B12 injections over the last month, will check B12 today

## 2021-07-21 DIAGNOSIS — H2511 Age-related nuclear cataract, right eye: Secondary | ICD-10-CM | POA: Diagnosis not present

## 2021-07-21 DIAGNOSIS — H269 Unspecified cataract: Secondary | ICD-10-CM | POA: Diagnosis not present

## 2021-07-21 DIAGNOSIS — E119 Type 2 diabetes mellitus without complications: Secondary | ICD-10-CM | POA: Diagnosis not present

## 2021-07-22 LAB — CULTURE, URINE COMPREHENSIVE

## 2021-07-24 ENCOUNTER — Other Ambulatory Visit: Payer: Self-pay | Admitting: Neurology

## 2021-07-24 DIAGNOSIS — E538 Deficiency of other specified B group vitamins: Secondary | ICD-10-CM

## 2021-07-24 DIAGNOSIS — R202 Paresthesia of skin: Secondary | ICD-10-CM

## 2021-07-25 ENCOUNTER — Encounter: Payer: Self-pay | Admitting: Internal Medicine

## 2021-07-31 ENCOUNTER — Encounter (INDEPENDENT_AMBULATORY_CARE_PROVIDER_SITE_OTHER): Payer: PPO | Admitting: Internal Medicine

## 2021-07-31 ENCOUNTER — Encounter: Payer: Self-pay | Admitting: Neurology

## 2021-07-31 DIAGNOSIS — N3090 Cystitis, unspecified without hematuria: Secondary | ICD-10-CM

## 2021-07-31 DIAGNOSIS — B961 Klebsiella pneumoniae [K. pneumoniae] as the cause of diseases classified elsewhere: Secondary | ICD-10-CM | POA: Diagnosis not present

## 2021-07-31 LAB — VITAMIN B12: Vitamin B-12: 255 pg/mL (ref 211–911)

## 2021-08-01 ENCOUNTER — Other Ambulatory Visit: Payer: Self-pay | Admitting: Internal Medicine

## 2021-08-01 DIAGNOSIS — B961 Klebsiella pneumoniae [K. pneumoniae] as the cause of diseases classified elsewhere: Secondary | ICD-10-CM | POA: Insufficient documentation

## 2021-08-01 DIAGNOSIS — N3090 Cystitis, unspecified without hematuria: Secondary | ICD-10-CM | POA: Insufficient documentation

## 2021-08-01 MED ORDER — LEVOFLOXACIN 250 MG PO TABS: 250.0000 mg | ORAL_TABLET | Freq: Every day | ORAL | 0 refills | Status: AC

## 2021-08-01 NOTE — Telephone Encounter (Signed)
Please see the MyChart message reply(ies) for my assessment and plan.  The patient gave consent for this Medical Advice Message and is aware that it may result in a bill to their insurance company as well as the possibility that this may result in a co-payment or deductible. They are an established patient, but are not seeking medical advice exclusively about a problem treated during an in person or video visit in the last 7 days. I did not recommend an in person or video visit within 7 days of my reply.  I spent a total of 7 minutes cumulative time within 7 days through MyChart messaging [name], MD

## 2021-08-04 ENCOUNTER — Other Ambulatory Visit: Payer: Self-pay | Admitting: Internal Medicine

## 2021-08-04 DIAGNOSIS — E118 Type 2 diabetes mellitus with unspecified complications: Secondary | ICD-10-CM

## 2021-08-09 ENCOUNTER — Other Ambulatory Visit: Payer: Self-pay | Admitting: Internal Medicine

## 2021-08-09 DIAGNOSIS — Z1231 Encounter for screening mammogram for malignant neoplasm of breast: Secondary | ICD-10-CM

## 2021-08-10 DIAGNOSIS — M461 Sacroiliitis, not elsewhere classified: Secondary | ICD-10-CM | POA: Diagnosis not present

## 2021-08-14 DIAGNOSIS — G4733 Obstructive sleep apnea (adult) (pediatric): Secondary | ICD-10-CM | POA: Diagnosis not present

## 2021-08-16 ENCOUNTER — Other Ambulatory Visit: Payer: Self-pay | Admitting: Internal Medicine

## 2021-08-16 ENCOUNTER — Encounter: Payer: Self-pay | Admitting: Internal Medicine

## 2021-08-16 DIAGNOSIS — B961 Klebsiella pneumoniae [K. pneumoniae] as the cause of diseases classified elsewhere: Secondary | ICD-10-CM

## 2021-08-16 DIAGNOSIS — N3000 Acute cystitis without hematuria: Secondary | ICD-10-CM

## 2021-08-17 ENCOUNTER — Other Ambulatory Visit (INDEPENDENT_AMBULATORY_CARE_PROVIDER_SITE_OTHER): Payer: PPO

## 2021-08-17 DIAGNOSIS — B961 Klebsiella pneumoniae [K. pneumoniae] as the cause of diseases classified elsewhere: Secondary | ICD-10-CM | POA: Diagnosis not present

## 2021-08-17 DIAGNOSIS — N3090 Cystitis, unspecified without hematuria: Secondary | ICD-10-CM

## 2021-08-17 DIAGNOSIS — N3000 Acute cystitis without hematuria: Secondary | ICD-10-CM | POA: Diagnosis not present

## 2021-08-17 LAB — URINALYSIS, ROUTINE W REFLEX MICROSCOPIC
Bilirubin Urine: NEGATIVE
Hgb urine dipstick: NEGATIVE
Ketones, ur: NEGATIVE
Leukocytes,Ua: NEGATIVE
Nitrite: NEGATIVE
RBC / HPF: NONE SEEN (ref 0–?)
Specific Gravity, Urine: 1.015 (ref 1.000–1.030)
Total Protein, Urine: NEGATIVE
Urine Glucose: NEGATIVE
Urobilinogen, UA: 0.2 (ref 0.0–1.0)
pH: 6 (ref 5.0–8.0)

## 2021-08-19 LAB — CULTURE, URINE COMPREHENSIVE

## 2021-08-21 ENCOUNTER — Other Ambulatory Visit: Payer: Self-pay | Admitting: Neurology

## 2021-08-21 DIAGNOSIS — E538 Deficiency of other specified B group vitamins: Secondary | ICD-10-CM

## 2021-08-21 DIAGNOSIS — R202 Paresthesia of skin: Secondary | ICD-10-CM

## 2021-08-23 LAB — HM DIABETES EYE EXAM

## 2021-08-30 DIAGNOSIS — H18231 Secondary corneal edema, right eye: Secondary | ICD-10-CM | POA: Diagnosis not present

## 2021-08-31 ENCOUNTER — Ambulatory Visit
Admission: RE | Admit: 2021-08-31 | Discharge: 2021-08-31 | Disposition: A | Discharge status: Home / Self Care | Payer: PPO | Source: Ambulatory Visit | Attending: Internal Medicine | Admitting: Internal Medicine

## 2021-08-31 DIAGNOSIS — Z1231 Encounter for screening mammogram for malignant neoplasm of breast: Secondary | ICD-10-CM | POA: Insufficient documentation

## 2021-09-13 ENCOUNTER — Other Ambulatory Visit: Payer: Self-pay | Admitting: Internal Medicine

## 2021-09-13 ENCOUNTER — Encounter: Payer: Self-pay | Admitting: Internal Medicine

## 2021-09-13 DIAGNOSIS — E118 Type 2 diabetes mellitus with unspecified complications: Secondary | ICD-10-CM

## 2021-09-13 DIAGNOSIS — G4700 Insomnia, unspecified: Secondary | ICD-10-CM

## 2021-09-13 DIAGNOSIS — F409 Phobic anxiety disorder, unspecified: Secondary | ICD-10-CM

## 2021-09-13 MED ORDER — FREESTYLE LIBRE 3 SENSOR MISC: 5 refills | Status: DC

## 2021-09-13 MED ORDER — ZOLPIDEM TARTRATE 10 MG PO TABS: 10.0000 mg | ORAL_TABLET | Freq: Every evening | ORAL | 0 refills | Status: DC | PRN

## 2021-09-14 DIAGNOSIS — G4733 Obstructive sleep apnea (adult) (pediatric): Secondary | ICD-10-CM | POA: Diagnosis not present

## 2021-09-16 DIAGNOSIS — Y939 Activity, unspecified: Secondary | ICD-10-CM | POA: Diagnosis not present

## 2021-09-16 DIAGNOSIS — R0902 Hypoxemia: Secondary | ICD-10-CM | POA: Diagnosis not present

## 2021-09-16 DIAGNOSIS — S2242XA Multiple fractures of ribs, left side, initial encounter for closed fracture: Secondary | ICD-10-CM | POA: Diagnosis not present

## 2021-09-16 DIAGNOSIS — Z6832 Body mass index (BMI) 32.0-32.9, adult: Secondary | ICD-10-CM | POA: Diagnosis not present

## 2021-09-16 DIAGNOSIS — G4733 Obstructive sleep apnea (adult) (pediatric): Secondary | ICD-10-CM | POA: Diagnosis not present

## 2021-09-16 DIAGNOSIS — J90 Pleural effusion, not elsewhere classified: Secondary | ICD-10-CM | POA: Diagnosis not present

## 2021-09-16 DIAGNOSIS — E8729 Other acidosis: Secondary | ICD-10-CM | POA: Diagnosis not present

## 2021-09-16 DIAGNOSIS — J9692 Respiratory failure, unspecified with hypercapnia: Secondary | ICD-10-CM | POA: Diagnosis not present

## 2021-09-16 DIAGNOSIS — S2241XA Multiple fractures of ribs, right side, initial encounter for closed fracture: Secondary | ICD-10-CM | POA: Diagnosis not present

## 2021-09-16 DIAGNOSIS — J9602 Acute respiratory failure with hypercapnia: Secondary | ICD-10-CM | POA: Diagnosis not present

## 2021-09-16 DIAGNOSIS — S42002A Fracture of unspecified part of left clavicle, initial encounter for closed fracture: Secondary | ICD-10-CM | POA: Diagnosis not present

## 2021-09-16 DIAGNOSIS — Y999 Unspecified external cause status: Secondary | ICD-10-CM | POA: Diagnosis not present

## 2021-09-16 DIAGNOSIS — E119 Type 2 diabetes mellitus without complications: Secondary | ICD-10-CM | POA: Diagnosis not present

## 2021-09-16 DIAGNOSIS — S81811A Laceration without foreign body, right lower leg, initial encounter: Secondary | ICD-10-CM | POA: Diagnosis not present

## 2021-09-16 DIAGNOSIS — Y92009 Unspecified place in unspecified non-institutional (private) residence as the place of occurrence of the external cause: Secondary | ICD-10-CM | POA: Diagnosis not present

## 2021-09-16 DIAGNOSIS — M79661 Pain in right lower leg: Secondary | ICD-10-CM | POA: Diagnosis not present

## 2021-09-16 DIAGNOSIS — J9811 Atelectasis: Secondary | ICD-10-CM | POA: Diagnosis not present

## 2021-09-16 DIAGNOSIS — S42032A Displaced fracture of lateral end of left clavicle, initial encounter for closed fracture: Secondary | ICD-10-CM | POA: Diagnosis not present

## 2021-09-16 DIAGNOSIS — S2249XA Multiple fractures of ribs, unspecified side, initial encounter for closed fracture: Secondary | ICD-10-CM | POA: Diagnosis not present

## 2021-09-16 DIAGNOSIS — S2239XA Fracture of one rib, unspecified side, initial encounter for closed fracture: Secondary | ICD-10-CM | POA: Diagnosis not present

## 2021-09-16 DIAGNOSIS — Y9289 Other specified places as the place of occurrence of the external cause: Secondary | ICD-10-CM | POA: Diagnosis not present

## 2021-09-16 DIAGNOSIS — I480 Paroxysmal atrial fibrillation: Secondary | ICD-10-CM | POA: Diagnosis not present

## 2021-09-16 DIAGNOSIS — T1490XA Injury, unspecified, initial encounter: Secondary | ICD-10-CM | POA: Diagnosis not present

## 2021-09-16 DIAGNOSIS — J9691 Respiratory failure, unspecified with hypoxia: Secondary | ICD-10-CM | POA: Diagnosis not present

## 2021-09-16 DIAGNOSIS — J45909 Unspecified asthma, uncomplicated: Secondary | ICD-10-CM | POA: Diagnosis not present

## 2021-09-16 DIAGNOSIS — J181 Lobar pneumonia, unspecified organism: Secondary | ICD-10-CM | POA: Diagnosis not present

## 2021-09-16 DIAGNOSIS — Z9981 Dependence on supplemental oxygen: Secondary | ICD-10-CM | POA: Diagnosis not present

## 2021-09-16 DIAGNOSIS — S2232XA Fracture of one rib, left side, initial encounter for closed fracture: Secondary | ICD-10-CM | POA: Diagnosis not present

## 2021-09-16 DIAGNOSIS — R918 Other nonspecific abnormal finding of lung field: Secondary | ICD-10-CM | POA: Diagnosis not present

## 2021-09-16 DIAGNOSIS — W108XXA Fall (on) (from) other stairs and steps, initial encounter: Secondary | ICD-10-CM | POA: Diagnosis not present

## 2021-09-16 DIAGNOSIS — J9601 Acute respiratory failure with hypoxia: Secondary | ICD-10-CM | POA: Diagnosis not present

## 2021-09-16 DIAGNOSIS — E039 Hypothyroidism, unspecified: Secondary | ICD-10-CM | POA: Diagnosis not present

## 2021-09-16 DIAGNOSIS — E669 Obesity, unspecified: Secondary | ICD-10-CM | POA: Diagnosis not present

## 2021-09-16 DIAGNOSIS — Q240 Dextrocardia: Secondary | ICD-10-CM | POA: Diagnosis not present

## 2021-09-16 DIAGNOSIS — I272 Pulmonary hypertension, unspecified: Secondary | ICD-10-CM | POA: Diagnosis not present

## 2021-09-16 DIAGNOSIS — W19XXXA Unspecified fall, initial encounter: Secondary | ICD-10-CM | POA: Diagnosis not present

## 2021-09-16 DIAGNOSIS — I10 Essential (primary) hypertension: Secondary | ICD-10-CM | POA: Diagnosis not present

## 2021-09-16 DIAGNOSIS — S42009A Fracture of unspecified part of unspecified clavicle, initial encounter for closed fracture: Secondary | ICD-10-CM | POA: Diagnosis not present

## 2021-09-17 DIAGNOSIS — S2241XA Multiple fractures of ribs, right side, initial encounter for closed fracture: Secondary | ICD-10-CM | POA: Diagnosis not present

## 2021-09-18 DIAGNOSIS — S2249XA Multiple fractures of ribs, unspecified side, initial encounter for closed fracture: Secondary | ICD-10-CM | POA: Diagnosis not present

## 2021-09-18 DIAGNOSIS — J181 Lobar pneumonia, unspecified organism: Secondary | ICD-10-CM | POA: Diagnosis not present

## 2021-09-23 DIAGNOSIS — J9811 Atelectasis: Secondary | ICD-10-CM | POA: Diagnosis not present

## 2021-09-23 DIAGNOSIS — J90 Pleural effusion, not elsewhere classified: Secondary | ICD-10-CM | POA: Diagnosis not present

## 2021-09-23 DIAGNOSIS — S2232XA Fracture of one rib, left side, initial encounter for closed fracture: Secondary | ICD-10-CM | POA: Diagnosis not present

## 2021-09-25 DIAGNOSIS — J9811 Atelectasis: Secondary | ICD-10-CM | POA: Diagnosis not present

## 2021-09-28 DIAGNOSIS — Z9981 Dependence on supplemental oxygen: Secondary | ICD-10-CM | POA: Diagnosis not present

## 2021-09-28 DIAGNOSIS — R0902 Hypoxemia: Secondary | ICD-10-CM | POA: Diagnosis not present

## 2021-09-29 ENCOUNTER — Inpatient Hospital Stay: Payer: PPO

## 2021-09-29 ENCOUNTER — Inpatient Hospital Stay
Admission: EM | Admit: 2021-09-29 | Discharge: 2021-10-06 | DRG: 871 | Disposition: A | Discharge status: Home Health | Payer: PPO | Attending: Internal Medicine | Admitting: Internal Medicine

## 2021-09-29 ENCOUNTER — Other Ambulatory Visit: Payer: Self-pay | Admitting: Internal Medicine

## 2021-09-29 ENCOUNTER — Other Ambulatory Visit: Payer: Self-pay

## 2021-09-29 ENCOUNTER — Encounter: Payer: Self-pay | Admitting: Family Medicine

## 2021-09-29 ENCOUNTER — Emergency Department: Payer: PPO

## 2021-09-29 DIAGNOSIS — J9811 Atelectasis: Secondary | ICD-10-CM | POA: Diagnosis not present

## 2021-09-29 DIAGNOSIS — Z79890 Hormone replacement therapy (postmenopausal): Secondary | ICD-10-CM | POA: Diagnosis not present

## 2021-09-29 DIAGNOSIS — D649 Anemia, unspecified: Secondary | ICD-10-CM | POA: Diagnosis not present

## 2021-09-29 DIAGNOSIS — I119 Hypertensive heart disease without heart failure: Secondary | ICD-10-CM | POA: Diagnosis not present

## 2021-09-29 DIAGNOSIS — I480 Paroxysmal atrial fibrillation: Secondary | ICD-10-CM | POA: Diagnosis not present

## 2021-09-29 DIAGNOSIS — W19XXXD Unspecified fall, subsequent encounter: Secondary | ICD-10-CM | POA: Diagnosis present

## 2021-09-29 DIAGNOSIS — Z7984 Long term (current) use of oral hypoglycemic drugs: Secondary | ICD-10-CM

## 2021-09-29 DIAGNOSIS — E669 Obesity, unspecified: Secondary | ICD-10-CM

## 2021-09-29 DIAGNOSIS — Z888 Allergy status to other drugs, medicaments and biological substances status: Secondary | ICD-10-CM

## 2021-09-29 DIAGNOSIS — E785 Hyperlipidemia, unspecified: Secondary | ICD-10-CM | POA: Diagnosis not present

## 2021-09-29 DIAGNOSIS — Z880 Allergy status to penicillin: Secondary | ICD-10-CM

## 2021-09-29 DIAGNOSIS — Z7901 Long term (current) use of anticoagulants: Secondary | ICD-10-CM

## 2021-09-29 DIAGNOSIS — K589 Irritable bowel syndrome without diarrhea: Secondary | ICD-10-CM | POA: Diagnosis present

## 2021-09-29 DIAGNOSIS — E039 Hypothyroidism, unspecified: Secondary | ICD-10-CM | POA: Diagnosis present

## 2021-09-29 DIAGNOSIS — Z79899 Other long term (current) drug therapy: Secondary | ICD-10-CM | POA: Diagnosis not present

## 2021-09-29 DIAGNOSIS — S42032D Displaced fracture of lateral end of left clavicle, subsequent encounter for fracture with routine healing: Secondary | ICD-10-CM | POA: Diagnosis not present

## 2021-09-29 DIAGNOSIS — I5181 Takotsubo syndrome: Secondary | ICD-10-CM | POA: Diagnosis not present

## 2021-09-29 DIAGNOSIS — F419 Anxiety disorder, unspecified: Secondary | ICD-10-CM | POA: Diagnosis present

## 2021-09-29 DIAGNOSIS — R778 Other specified abnormalities of plasma proteins: Secondary | ICD-10-CM

## 2021-09-29 DIAGNOSIS — R0600 Dyspnea, unspecified: Principal | ICD-10-CM

## 2021-09-29 DIAGNOSIS — Z6834 Body mass index (BMI) 34.0-34.9, adult: Secondary | ICD-10-CM

## 2021-09-29 DIAGNOSIS — S81811D Laceration without foreign body, right lower leg, subsequent encounter: Secondary | ICD-10-CM | POA: Diagnosis not present

## 2021-09-29 DIAGNOSIS — Y95 Nosocomial condition: Secondary | ICD-10-CM | POA: Diagnosis present

## 2021-09-29 DIAGNOSIS — I10 Essential (primary) hypertension: Secondary | ICD-10-CM | POA: Diagnosis not present

## 2021-09-29 DIAGNOSIS — I214 Non-ST elevation (NSTEMI) myocardial infarction: Secondary | ICD-10-CM | POA: Diagnosis present

## 2021-09-29 DIAGNOSIS — I4891 Unspecified atrial fibrillation: Secondary | ICD-10-CM | POA: Diagnosis not present

## 2021-09-29 DIAGNOSIS — S2249XA Multiple fractures of ribs, unspecified side, initial encounter for closed fracture: Secondary | ICD-10-CM

## 2021-09-29 DIAGNOSIS — J9601 Acute respiratory failure with hypoxia: Secondary | ICD-10-CM | POA: Diagnosis not present

## 2021-09-29 DIAGNOSIS — E119 Type 2 diabetes mellitus without complications: Secondary | ICD-10-CM

## 2021-09-29 DIAGNOSIS — R6520 Severe sepsis without septic shock: Secondary | ICD-10-CM

## 2021-09-29 DIAGNOSIS — I4589 Other specified conduction disorders: Secondary | ICD-10-CM | POA: Diagnosis present

## 2021-09-29 DIAGNOSIS — G4733 Obstructive sleep apnea (adult) (pediatric): Secondary | ICD-10-CM

## 2021-09-29 DIAGNOSIS — K219 Gastro-esophageal reflux disease without esophagitis: Secondary | ICD-10-CM | POA: Diagnosis present

## 2021-09-29 DIAGNOSIS — Z833 Family history of diabetes mellitus: Secondary | ICD-10-CM

## 2021-09-29 DIAGNOSIS — J189 Pneumonia, unspecified organism: Secondary | ICD-10-CM

## 2021-09-29 DIAGNOSIS — Z20822 Contact with and (suspected) exposure to covid-19: Secondary | ICD-10-CM | POA: Diagnosis present

## 2021-09-29 DIAGNOSIS — E876 Hypokalemia: Secondary | ICD-10-CM | POA: Diagnosis present

## 2021-09-29 DIAGNOSIS — F32A Depression, unspecified: Secondary | ICD-10-CM

## 2021-09-29 DIAGNOSIS — R609 Edema, unspecified: Secondary | ICD-10-CM

## 2021-09-29 DIAGNOSIS — E118 Type 2 diabetes mellitus with unspecified complications: Secondary | ICD-10-CM

## 2021-09-29 DIAGNOSIS — S2242XD Multiple fractures of ribs, left side, subsequent encounter for fracture with routine healing: Secondary | ICD-10-CM | POA: Diagnosis not present

## 2021-09-29 DIAGNOSIS — S2242XA Multiple fractures of ribs, left side, initial encounter for closed fracture: Secondary | ICD-10-CM | POA: Diagnosis not present

## 2021-09-29 DIAGNOSIS — F329 Major depressive disorder, single episode, unspecified: Secondary | ICD-10-CM | POA: Diagnosis present

## 2021-09-29 DIAGNOSIS — R079 Chest pain, unspecified: Secondary | ICD-10-CM | POA: Diagnosis not present

## 2021-09-29 DIAGNOSIS — A419 Sepsis, unspecified organism: Principal | ICD-10-CM | POA: Diagnosis present

## 2021-09-29 DIAGNOSIS — I2510 Atherosclerotic heart disease of native coronary artery without angina pectoris: Secondary | ICD-10-CM | POA: Diagnosis not present

## 2021-09-29 DIAGNOSIS — M25512 Pain in left shoulder: Secondary | ICD-10-CM | POA: Diagnosis not present

## 2021-09-29 DIAGNOSIS — Z9989 Dependence on other enabling machines and devices: Secondary | ICD-10-CM

## 2021-09-29 DIAGNOSIS — S42032A Displaced fracture of lateral end of left clavicle, initial encounter for closed fracture: Secondary | ICD-10-CM | POA: Diagnosis not present

## 2021-09-29 DIAGNOSIS — R0602 Shortness of breath: Secondary | ICD-10-CM | POA: Diagnosis not present

## 2021-09-29 DIAGNOSIS — S42002A Fracture of unspecified part of left clavicle, initial encounter for closed fracture: Secondary | ICD-10-CM | POA: Diagnosis not present

## 2021-09-29 DIAGNOSIS — E8770 Fluid overload, unspecified: Secondary | ICD-10-CM | POA: Diagnosis not present

## 2021-09-29 DIAGNOSIS — Z882 Allergy status to sulfonamides status: Secondary | ICD-10-CM

## 2021-09-29 DIAGNOSIS — I5021 Acute systolic (congestive) heart failure: Secondary | ICD-10-CM | POA: Diagnosis not present

## 2021-09-29 DIAGNOSIS — E66811 Obesity, class 1: Secondary | ICD-10-CM

## 2021-09-29 LAB — COMPREHENSIVE METABOLIC PANEL
ALT: 60 U/L — ABNORMAL HIGH (ref 0–44)
ALT: 51 U/L — ABNORMAL HIGH (ref 0–44)
AST: 60 U/L — ABNORMAL HIGH (ref 15–41)
AST: 51 U/L — ABNORMAL HIGH (ref 15–41)
Albumin: 3.9 g/dL (ref 3.5–5.0)
Albumin: 3 g/dL — ABNORMAL LOW (ref 3.5–5.0)
Alkaline Phosphatase: 101 U/L (ref 38–126)
Alkaline Phosphatase: 79 U/L (ref 38–126)
Anion gap: 11 (ref 5–15)
Anion gap: 10 (ref 5–15)
BUN: 10 mg/dL (ref 8–23)
BUN: 8 mg/dL (ref 8–23)
CO2: 25 mmol/L (ref 22–32)
CO2: 26 mmol/L (ref 22–32)
Calcium: 10.1 mg/dL (ref 8.9–10.3)
Calcium: 9.2 mg/dL (ref 8.9–10.3)
Chloride: 104 mmol/L (ref 98–111)
Chloride: 103 mmol/L (ref 98–111)
Creatinine, Ser: 0.65 mg/dL (ref 0.44–1.00)
Creatinine, Ser: 0.56 mg/dL (ref 0.44–1.00)
GFR, Estimated: >60 mL/min (ref >60)
GFR, Estimated: >60 mL/min (ref >60)
Glucose, Bld: 170 mg/dL — ABNORMAL HIGH (ref 70–99)
Glucose, Bld: 135 mg/dL — ABNORMAL HIGH (ref 70–99)
Potassium: 3.2 mmol/L — ABNORMAL LOW (ref 3.5–5.1)
Potassium: 3.1 mmol/L — ABNORMAL LOW (ref 3.5–5.1)
Sodium: 140 mmol/L (ref 135–145)
Sodium: 139 mmol/L (ref 135–145)
Total Bilirubin: 1 mg/dL (ref 0.3–1.2)
Total Bilirubin: 0.9 mg/dL (ref 0.3–1.2)
Total Protein: 7.2 g/dL (ref 6.5–8.1)
Total Protein: 5.8 g/dL — ABNORMAL LOW (ref 6.5–8.1)

## 2021-09-29 LAB — CBC
HCT: 41.4 % (ref 36.0–46.0)
Hemoglobin: 13 g/dL (ref 12.0–15.0)
MCH: 28.7 pg (ref 26.0–34.0)
MCHC: 31.4 g/dL (ref 30.0–36.0)
MCV: 91.4 fL (ref 80.0–100.0)
Platelets: 362 10*3/uL (ref 150–400)
RBC: 4.53 MIL/uL (ref 3.87–5.11)
RDW: 14.5 % (ref 11.5–15.5)
WBC: 14.9 10*3/uL — ABNORMAL HIGH (ref 4.0–10.5)
nRBC: 0 % (ref 0.0–0.2)

## 2021-09-29 LAB — CBC WITH DIFFERENTIAL/PLATELET
Abs Immature Granulocytes: 0.07 10*3/uL (ref 0.00–0.07)
Basophils Absolute: 0 10*3/uL (ref 0.0–0.1)
Basophils Relative: 0 %
Eosinophils Absolute: 0 10*3/uL (ref 0.0–0.5)
Eosinophils Relative: 0 %
HCT: 35.5 % — ABNORMAL LOW (ref 36.0–46.0)
Hemoglobin: 11.3 g/dL — ABNORMAL LOW (ref 12.0–15.0)
Immature Granulocytes: 1 %
Lymphocytes Relative: 15 %
Lymphs Abs: 1.6 10*3/uL (ref 0.7–4.0)
MCH: 29.2 pg (ref 26.0–34.0)
MCHC: 31.8 g/dL (ref 30.0–36.0)
MCV: 91.7 fL (ref 80.0–100.0)
Monocytes Absolute: 0.6 10*3/uL (ref 0.1–1.0)
Monocytes Relative: 6 %
Neutro Abs: 8.4 10*3/uL — ABNORMAL HIGH (ref 1.7–7.7)
Neutrophils Relative %: 78 %
Platelets: 298 10*3/uL (ref 150–400)
RBC: 3.87 MIL/uL (ref 3.87–5.11)
RDW: 14.6 % (ref 11.5–15.5)
WBC: 10.8 10*3/uL — ABNORMAL HIGH (ref 4.0–10.5)
nRBC: 0 % (ref 0.0–0.2)

## 2021-09-29 LAB — TROPONIN I (HIGH SENSITIVITY)
Troponin I (High Sensitivity): 397 ng/L (ref <18)
Troponin I (High Sensitivity): 1399 ng/L (ref <18)
Troponin I (High Sensitivity): 1665 ng/L (ref <18)

## 2021-09-29 LAB — APTT
aPTT: 35 seconds (ref 24–36)
aPTT: 118 seconds — ABNORMAL HIGH (ref 24–36)
aPTT: 52 seconds — ABNORMAL HIGH (ref 24–36)

## 2021-09-29 LAB — BRAIN NATRIURETIC PEPTIDE: B Natriuretic Peptide: 105.9 pg/mL — ABNORMAL HIGH (ref 0.0–100.0)

## 2021-09-29 LAB — PROTIME-INR
INR: 1.4 — ABNORMAL HIGH (ref 0.8–1.2)
Prothrombin Time: 17.5 seconds — ABNORMAL HIGH (ref 11.4–15.2)

## 2021-09-29 LAB — LACTIC ACID, PLASMA
Lactic Acid, Venous: 0.8 mmol/L (ref 0.5–1.9)
Lactic Acid, Venous: 1 mmol/L (ref 0.5–1.9)

## 2021-09-29 LAB — HEPARIN LEVEL (UNFRACTIONATED): Heparin Unfractionated: >1.1 IU/mL — ABNORMAL HIGH (ref 0.30–0.70)

## 2021-09-29 LAB — PROCALCITONIN: Procalcitonin: <0.1 ng/mL

## 2021-09-29 MED ORDER — NITROGLYCERIN 0.4 MG SL SUBL: 0.4000 mg | SUBLINGUAL_TABLET | SUBLINGUAL | Status: DC | PRN

## 2021-09-29 MED ORDER — HEPARIN (PORCINE) 25000 UT/250ML-% IV SOLN
1200.0000 [IU]/h | INTRAVENOUS | Status: DC
  Administered 2021-09-29: 1050 [IU]/h via INTRAVENOUS
  Administered 2021-09-30 – 2021-10-01 (×2): 1350 [IU]/h via INTRAVENOUS
  Filled 2021-09-29 (×3): qty 250

## 2021-09-29 MED ORDER — SODIUM CHLORIDE 0.9 % IV SOLN
1.0000 g | Freq: Once | INTRAVENOUS | Status: AC
  Administered 2021-09-29: 1 g via INTRAVENOUS
  Filled 2021-09-29: qty 10

## 2021-09-29 MED ORDER — LEVOTHYROXINE SODIUM 50 MCG PO TABS
175.0000 ug | ORAL_TABLET | Freq: Every day | ORAL | Status: DC
  Administered 2021-09-30 – 2021-10-06 (×7): 175 ug via ORAL
  Filled 2021-09-29 (×7): qty 1
  Filled 2021-09-29: qty 4

## 2021-09-29 MED ORDER — ASPIRIN 81 MG PO CHEW: 324.0000 mg | CHEWABLE_TABLET | ORAL | Status: AC

## 2021-09-29 MED ORDER — METOPROLOL SUCCINATE ER 50 MG PO TB24
50.0000 mg | ORAL_TABLET | Freq: Every day | ORAL | Status: DC
  Filled 2021-09-29: qty 1

## 2021-09-29 MED ORDER — ALBUTEROL SULFATE (2.5 MG/3ML) 0.083% IN NEBU
2.5000 mg | INHALATION_SOLUTION | Freq: Four times a day (QID) | RESPIRATORY_TRACT | Status: DC | PRN
  Administered 2021-09-30 (×2): 2.5 mg via RESPIRATORY_TRACT
  Filled 2021-09-29 (×2): qty 3

## 2021-09-29 MED ORDER — ASPIRIN 300 MG RE SUPP: 300.0000 mg | RECTAL | Status: AC

## 2021-09-29 MED ORDER — ALPRAZOLAM 0.25 MG PO TABS
0.2500 mg | ORAL_TABLET | Freq: Two times a day (BID) | ORAL | Status: DC | PRN
  Administered 2021-09-30 – 2021-10-05 (×7): 0.25 mg via ORAL
  Filled 2021-09-29 (×8): qty 1

## 2021-09-29 MED ORDER — SODIUM CHLORIDE 0.9 % IV SOLN
2.0000 g | INTRAVENOUS | Status: DC
  Administered 2021-09-29: 2 g via INTRAVENOUS
  Filled 2021-09-29 (×2): qty 20

## 2021-09-29 MED ORDER — MAGNESIUM HYDROXIDE 400 MG/5ML PO SUSP: 30.0000 mL | Freq: Every day | ORAL | Status: DC | PRN

## 2021-09-29 MED ORDER — POTASSIUM CHLORIDE IN NACL 20-0.9 MEQ/L-% IV SOLN
INTRAVENOUS | Status: DC
  Filled 2021-09-29 (×3): qty 1000

## 2021-09-29 MED ORDER — SODIUM CHLORIDE 0.9 % IV BOLUS (SEPSIS): 1000.0000 mL | Freq: Once | INTRAVENOUS | Status: DC

## 2021-09-29 MED ORDER — ASPIRIN 81 MG PO CHEW
324.0000 mg | CHEWABLE_TABLET | Freq: Once | ORAL | Status: AC
  Administered 2021-09-29: 324 mg via ORAL
  Filled 2021-09-29: qty 4

## 2021-09-29 MED ORDER — IOHEXOL 350 MG/ML SOLN
75.0000 mL | Freq: Once | INTRAVENOUS | Status: AC | PRN
  Administered 2021-09-29: 75 mL via INTRAVENOUS

## 2021-09-29 MED ORDER — SODIUM CHLORIDE 0.9 % IV SOLN
500.0000 mg | Freq: Once | INTRAVENOUS | Status: AC
  Administered 2021-09-29: 500 mg via INTRAVENOUS
  Filled 2021-09-29: qty 5

## 2021-09-29 MED ORDER — PAROXETINE HCL ER 12.5 MG PO TB24
25.0000 mg | ORAL_TABLET | Freq: Every day | ORAL | Status: DC
  Administered 2021-09-29 – 2021-10-06 (×8): 25 mg via ORAL
  Filled 2021-09-29 (×8): qty 2

## 2021-09-29 MED ORDER — HEPARIN SODIUM (PORCINE) 5000 UNIT/ML IJ SOLN
4000.0000 [IU] | Freq: Once | INTRAMUSCULAR | Status: AC
  Administered 2021-09-29: 4000 [IU] via INTRAVENOUS
  Filled 2021-09-29: qty 1

## 2021-09-29 MED ORDER — HEPARIN BOLUS VIA INFUSION
2000.0000 [IU] | Freq: Once | INTRAVENOUS | Status: AC
  Administered 2021-09-29: 2000 [IU] via INTRAVENOUS
  Filled 2021-09-29: qty 2000

## 2021-09-29 MED ORDER — SODIUM CHLORIDE 0.9 % IV SOLN
500.0000 mg | INTRAVENOUS | Status: DC
  Filled 2021-09-29: qty 5

## 2021-09-29 MED ORDER — ZOLPIDEM TARTRATE 5 MG PO TABS: 5.0000 mg | ORAL_TABLET | Freq: Every evening | ORAL | Status: DC | PRN

## 2021-09-29 MED ORDER — AMIODARONE HCL 200 MG PO TABS
200.0000 mg | ORAL_TABLET | Freq: Every day | ORAL | Status: DC
  Administered 2021-09-30 – 2021-10-06 (×7): 200 mg via ORAL
  Filled 2021-09-29 (×8): qty 1

## 2021-09-29 MED ORDER — HYDROCHLOROTHIAZIDE 25 MG PO TABS
25.0000 mg | ORAL_TABLET | Freq: Every day | ORAL | Status: DC
  Filled 2021-09-29 (×2): qty 1

## 2021-09-29 MED ORDER — ATORVASTATIN CALCIUM 20 MG PO TABS: 40.0000 mg | ORAL_TABLET | Freq: Every day | ORAL | Status: DC

## 2021-09-29 MED ORDER — HEPARIN (PORCINE) 25000 UT/250ML-% IV SOLN: 10.0000 [IU]/kg/h | INTRAVENOUS | Status: DC

## 2021-09-29 MED ORDER — ACETAMINOPHEN 325 MG PO TABS
650.0000 mg | ORAL_TABLET | ORAL | Status: DC | PRN
Start: 2021-09-29 — End: 2021-10-06
  Administered 2021-09-29 – 2021-10-06 (×11): 650 mg via ORAL
  Filled 2021-09-29 (×10): qty 2

## 2021-09-29 MED ORDER — TRAZODONE HCL 50 MG PO TABS
25.0000 mg | ORAL_TABLET | Freq: Every evening | ORAL | Status: DC | PRN
  Administered 2021-09-30 – 2021-10-04 (×2): 25 mg via ORAL
  Filled 2021-09-29 (×3): qty 1

## 2021-09-29 MED ORDER — ATORVASTATIN CALCIUM 20 MG PO TABS
40.0000 mg | ORAL_TABLET | Freq: Every day | ORAL | Status: DC
  Administered 2021-09-29 – 2021-10-06 (×8): 40 mg via ORAL
  Filled 2021-09-29 (×8): qty 2

## 2021-09-29 MED ORDER — ASPIRIN 81 MG PO TBEC
81.0000 mg | DELAYED_RELEASE_TABLET | Freq: Every day | ORAL | Status: DC
  Administered 2021-09-30 – 2021-10-06 (×7): 81 mg via ORAL
  Filled 2021-09-29 (×7): qty 1

## 2021-09-29 MED ORDER — MORPHINE SULFATE (PF) 2 MG/ML IV SOLN
2.0000 mg | INTRAVENOUS | Status: DC | PRN
  Administered 2021-09-29 – 2021-10-03 (×7): 2 mg via INTRAVENOUS
  Filled 2021-09-29 (×8): qty 1

## 2021-09-29 MED ORDER — ONDANSETRON HCL 4 MG/2ML IJ SOLN
4.0000 mg | Freq: Four times a day (QID) | INTRAMUSCULAR | Status: DC | PRN
  Administered 2021-09-29 (×2): 4 mg via INTRAVENOUS
  Filled 2021-09-29 (×2): qty 2

## 2021-09-29 NOTE — Assessment & Plan Note (Signed)
-   We will continue statin therapy with higher dose.

## 2021-09-29 NOTE — Assessment & Plan Note (Signed)
-   The patient will be placed on supplement coverage with NovoLog. - We will hold off metformin.

## 2021-09-29 NOTE — ED Triage Notes (Signed)
First nurse noted  Pt had a fall aug 5th in [place], pt obtained broken ribs and left clavicle. Pt was discharged and came home and now is having sob and increased swelling to bilat legs, denies hx of chf

## 2021-09-29 NOTE — Assessment & Plan Note (Addendum)
-   This is manifested by leukocytosis and tachypnea. - The patient meets severe sepsis criteria given development of hypotension. - We will place on IV Rocephin and Zithromax. - We will hydrate with IV normal saline. - We will check lactic acid level. - We will follow blood and sputum cultures.

## 2021-09-29 NOTE — Sepsis Progress Note (Signed)
Sepsis protocol monitored by eLink 

## 2021-09-29 NOTE — ED Notes (Signed)
Date and time results received: 09/29/21 1159 (use smartphrase ".now" to insert current time)  Test: troponin  Critical Value: 397  Name of Provider Notified: malinda  Orders Received? Or Actions Taken?: will continue to monitor, see orders

## 2021-09-29 NOTE — Assessment & Plan Note (Signed)
-   We will holding off her Eliquis and continuing her on IV heparin. - We will continue amiodarone and Toprol-XL.

## 2021-09-29 NOTE — Progress Notes (Signed)
[facility] for initiation & monitoring of heparin infusion Indication: chest pain/ACS  Allergies  Allergen Reactions   Doxycycline Rash   Factive [Gemifloxacin Mesylate] Rash   Crestor [Rosuvastatin]     GI upset   Sulfonamide Derivatives     REACTION: rash   Gemifloxacin Rash   Penicillins Hives and Rash    Has patient had a PCN reaction causing immediate rash, facial/tongue/throat swelling, SOB or lightheadedness with hypotension: No Has patient had a PCN reaction causing severe rash involving mucus membranes or skin necrosis: No Has patient had a PCN reaction that required hospitalization No Has patient had a PCN reaction occurring within the last 10 years: No If all of the above answers are "NO", then may proceed with Cephalosporin use.     Patient Measurements: Height: 5' 7"  (170.2 cm) Weight: 87.1 kg (192 lb 0.3 oz) IBW/kg (Calculated) : 61.6 Heparin Dosing Weight: 80 kg  Vital Signs: Temp: 98.3 F (36.8 C) (08/18 1010) Temp Source: Oral (08/18 1010) BP: 132/76 (08/18 1050) Pulse Rate: 74 (08/18 1115)  Labs: Recent Labs    09/29/21 1012 09/29/21 1040 09/29/21 1236  HGB 13.0  --   --   HCT 41.4  --   --   PLT 362  --   --   APTT  --  35  --   CREATININE 0.65  --   --   TROPONINIHS 397*  --  1,399*    Estimated Creatinine Clearance: 80.5 mL/min (by C-G formula based on SCr of 0.65 mg/dL).   Medical History: Past Medical History:  Diagnosis Date   A-fib ([facility])    Anxiety disorder    Asthma    Dyspnea    Gastroschisis    umphalocele, rotated organs abd repair until age 29   GERD (gastroesophageal reflux disease)    HTN (hypertension)    Hyperlipidemia    IBS (irritable bowel syndrome)    Morbid obesity ([facility])    Target wt - 185  for BMI < 30   Obesity    OSA on CPAP    SBO (small bowel obstruction) ([facility])    Resolved with NG/Bowel rest around 2009   Sleep apnea    Type II or unspecified type diabetes mellitus  without mention of complication, not stated as uncontrolled     Assessment:  66 y.o. female w/ PMH of  atrial fibrillation, asthma, GERD, hypertension, dyslipidemia, IBS, OSA on CPAP, type 2 diabetes mellitus and dyslipidemia,obesity having increasing shortness of breath and swelling in both her legs with presumed NSTEMI. She is noted to take apixaban with last dose this morning 09/29/21.  Goal of Therapy:  Heparin level 0.3-0.7 units/ml aPTT 66 - 102  seconds Monitor platelets by anticoagulation protocol: Yes   Plan:  Give 4000 units bolus x 1 Start heparin infusion at 1050 units/hr Check aPTT level in 6 hours and heparin level once daily while on heparin aPTT level will be used to guide therapy until aPTT and heparin levels correlate Continue to monitor H&H and platelets  Dallie Piles 09/29/2021,1:25 PM

## 2021-09-29 NOTE — Assessment & Plan Note (Signed)
We will continue CPAP nightly.

## 2021-09-29 NOTE — ED Notes (Signed)
Dr. Saralyn Pilar at bedside.

## 2021-09-29 NOTE — ED Notes (Signed)
This RN confirmed with Dr. Cinda Quest that pt. Is to get heparin bolus and infusion. Dr. Cinda Quest states that they are d/c-ing pt's home eliquis and using heparin, so give bolus and infusion.

## 2021-09-29 NOTE — Assessment & Plan Note (Signed)
-   We will continue Paxil.

## 2021-09-29 NOTE — ED Provider Notes (Signed)
[facility] Provider Note    Event Date/Time   First MD Initiated Contact with Patient 09/29/21 1043     (approximate)   History   Shortness of Breath   HPI  Kirsten [name] is a 66 y.o. female who was up in [place] and fell.  She has broken ribs in the left clavicle from that.  She was in the hospital for several days with rising CO2 and a number of other problems but then improved and was discharged yesterday and drove home.  Now this morning she was having increasing shortness of breath and swelling in both her legs.  She has no history of CHF.  She has no history of any other problems.  She is not running a fever.  She has no new pain or other symptoms.  Just the swelling and shortness of breath      Physical Exam   Triage Vital Signs: ED Triage Vitals [09/29/21 1010]  Enc Vitals Group     BP (!) 145/69     Pulse Rate 89     Resp 18     Temp 98.3 F (36.8 C)     Temp Source Oral     SpO2 (!) 87 %     Weight 192 lb 0.3 oz (87.1 kg)     Height 5' 7"  (1.702 m)     Head Circumference      Peak Flow      Pain Score 0     Pain Loc      Pain Edu?      Excl. in [place]?     Most recent vital signs: Vitals:   09/29/21 1100 09/29/21 1115  BP:    Pulse: 73 74  Resp: 17 19  Temp:    SpO2: 98% 98%     General: Awake, alert but does not look comfortable O2 sats were 87 on room air in triage CV:  Good peripheral perfusion.  Heart regular rate and rhythm no audible murmurs Resp:  Normal effort.  Lungs with bilateral crackles in the bases Abd:  No distention.  Soft and nontender Extremities with 1+ edema bilaterally   ED Results / Procedures / Treatments   Labs (all labs ordered are listed, but only abnormal results are displayed) Labs Reviewed  CBC - Abnormal; Notable for the following components:      Result Value   WBC 14.9 (*)    All other components within normal limits  COMPREHENSIVE METABOLIC PANEL - Abnormal; Notable for the  following components:   Potassium 3.2 (*)    Glucose, Bld 170 (*)    AST 60 (*)    ALT 60 (*)    All other components within normal limits  BRAIN NATRIURETIC PEPTIDE - Abnormal; Notable for the following components:   B Natriuretic Peptide 105.9 (*)    All other components within normal limits  TROPONIN I (HIGH SENSITIVITY) - Abnormal; Notable for the following components:   Troponin I (High Sensitivity) 397 (*)    All other components within normal limits  APTT  CBC WITH DIFFERENTIAL/PLATELET  CBC WITH DIFFERENTIAL/PLATELET  PROCALCITONIN  TROPONIN I (HIGH SENSITIVITY)     EKG  EKG read interpreted by me shows normal sinus rhythm rate of 93 left axis nonspecific ST-T wave changes similar to prior EKG.  QRS duration is 126 ms  RADIOLOGY Chest x-ray shows basilar infiltrates.   PROCEDURES:  Critical Care performed: Critical care time 30 minutes.  This  includes discussing with the patient and her husband and what had happened attempting to look for old records from [place] which I have been unable to find so far talking with the hospitalist reviewing her old records asking the patient some more questions etc.  Procedures   MEDICATIONS ORDERED IN ED: Medications  azithromycin (ZITHROMAX) 500 mg in sodium chloride 0.9 % 250 mL IVPB (has no administration in time range)  ceFEPIme (MAXIPIME) 1 g in sodium chloride 0.9 % 100 mL IVPB (has no administration in time range)  heparin injection 4,000 Units (has no administration in time range)  heparin ADULT infusion 100 units/mL (25000 units/264m) (has no administration in time range)  aspirin chewable tablet 324 mg (has no administration in time range)     IMPRESSION / MDM / ASSESSMENT AND PLAN / ED COURSE  I reviewed the triage vital signs and the nursing notes. Patient is taking her Eliquis.  She was on heparin while in the hospital and is back on her Eliquis now. Patient has an elevated white blood count.  This is new for  her.  Additionally she has the changes on her chest x-ray and the edema in her legs.  Her troponin is elevated at 397.  She could have had a cardiac contusion or maybe she had a heart attack at some time [place]  She could have developed CHF.  She has some pleuritic chest pain.  I will CT her chest that she has had prolonged immobilization even while she has been on a heparin and Eliquis.  D-dimer would be positive anyway she has multiple bruises.  That may help determine if she has CHF for isolated pneumonia or something else.   Patient's presentation is most consistent with acute presentation with potential threat to life or bodily function.  The patient is on the cardiac monitor to evaluate for evidence of arrhythmia and/or significant heart rate changes.  None have been seen patient does have a history of paroxysmal A-fib  ----------------------------------------- 1:25 PM on 09/29/2021 ----------------------------------------- I have discussed the patient with the hospitalist.  We will not wait for the second troponin which is not back yet we will just presume this may be an NSTEMI and begin treating her with heparin and aspirin to hold the Eliquis for now.  I have given her antibiotics.  She may also benefit from some Lasix but I will not give that just yet.  I am still waiting on procalcitonin etc. to come back.  We will get her in the hospital.   FINAL CLINICAL IMPRESSION(S) / ED DIAGNOSES   Final diagnoses:  Dyspnea, unspecified type  Elevated troponin  NSTEMI (non-ST elevated myocardial infarction) ([facility]  Healthcare-associated pneumonia  Edema, unspecified type     Rx / DC Orders   ED Discharge Orders     None        Note:  This document was prepared using Dragon voice recognition software and may include unintentional dictation errors.   MNena Polio MD 09/29/21 1326

## 2021-09-29 NOTE — H&P (Addendum)
[name]   PATIENT [name]    MR#:  [id number]  DATE OF BIRTH:  10-14-1955  DATE OF ADMISSION:  09/29/2021  PRIMARY CARE PHYSICIAN: [name], MD   Patient is coming from: Home  REQUESTING/REFERRING PHYSICIAN: Conni Slipper, MD  CHIEF COMPLAINT:   Chief Complaint  Patient presents with   Shortness of Breath    HISTORY OF PRESENT ILLNESS:  [name] is a 66 y.o. Caucasian female with medical history significant for atrial fibrillation on Eliquis, asthma, GERD, hypertension, dyslipidemia, IBS, OSA on CPAP, type 2 diabetes mellitus and dyslipidemia and obesity, who presented to the ER  who was in [place] and fell with subsequent left-sided 8 ribs fracture as well as left clavicular fracture for which she was hospitalized for several days and had apparently had hypercarbic respiratory failure that was managed with improvement she was discharged yesterday and drove home.  This morning she was having increasing dyspnea and worsening lower extremity edema with no previous history of CHF.  She admits to left upper back pain that is not different from her pain with ribs and clavicular fracture.  She has been having mild cough with inability to expectorate though she was trying to clear her throat.  She admits to occasional wheezing and has been fatigued and tired.  She felt hot and chilly but did not have any reported fever.  No nausea or vomiting or abdominal pain or diarrhea or melena or bright red bleeding per rectum.  No other bleeding diathesis.  She denies any fever or chills.  She has a healing clean right leg sutured wound.  ED Course: Upon presentation to the ER, BP was 145/69 with initial pulse oximetry of 87 likely inaccurate and later 98% on room air.  Labs revealed mild hypokalemia 3.2 and blood glucose of 170 with AST of 16 ALT of 60 with otherwise unremarkable CMP.  BNP was 105.9 and high sensitive troponin I was 397 and later 1399 EKG as reviewed  by me : EKG showed normal sinus rhythm with a rate of 93 with nonspecific intraventricular conduction delay and minimal voltage criteria for LVH with T wave inversion laterally. Imaging: Two-view chest x-ray showed patchy left basal atelectasis/consolidation.  The patient was started on IV heparin drip for suspected MI.  Chest CTA was ordered and is currently pending to rule out PE.  We will start her on hydration with IV normal saline as well as IV antibiotic therapy for suspected underlying sepsis from pneumonia.  She will be admitted to a PCU bed for further evaluation and management. PAST MEDICAL HISTORY:   Past Medical History:  Diagnosis Date   A-fib ([place])    Anxiety disorder    Asthma    Dyspnea    Gastroschisis    umphalocele, rotated organs abd repair until age 77   GERD (gastroesophageal reflux disease)    HTN (hypertension)    Hyperlipidemia    IBS (irritable bowel syndrome)    Morbid obesity (HCC)    Target wt - 185  for BMI < 30   Obesity    OSA on CPAP    SBO (small bowel obstruction) ([place])    Resolved with NG/Bowel rest around 2009   Sleep apnea    Type II or unspecified type diabetes mellitus without mention of complication, not stated as uncontrolled     PAST SURGICAL HISTORY:   Past Surgical History:  Procedure Laterality Date   BREAST EXCISIONAL  BIOPSY Left 03/19/2012   neg   CESAREAN SECTION     CHOLECYSTECTOMY  1992   COLONOSCOPY  2011   2011-normal   Newborn Surgery - GI - ORGANS OUTSIDE ABDOMEN     Small Bowel Repair     TUBAL LIGATION  1988    SOCIAL HISTORY:   Social History   Tobacco Use   Smoking status: Never   Smokeless tobacco: Never   Tobacco comments:    "tried as a teen"  Substance Use Topics   Alcohol use: Yes    Alcohol/week: 2.0 standard drinks of alcohol    Types: 2 Glasses of wine per week    Comment: rare    FAMILY HISTORY:   Family History  Problem Relation Age of Onset   Lymphoma Mother    Diabetes type II Father     Colon cancer Father    Diabetes Maternal Grandmother    Diabetes type I Son    Goiter Neg Hx    Breast cancer Neg Hx    Ovarian cancer Neg Hx     DRUG ALLERGIES:   Allergies  Allergen Reactions   Doxycycline Rash   Factive [Gemifloxacin Mesylate] Rash   Crestor [Rosuvastatin]     GI upset   Sulfonamide Derivatives     REACTION: rash   Gemifloxacin Rash   Penicillins Hives and Rash    Has patient had a PCN reaction causing immediate rash, facial/tongue/throat swelling, SOB or lightheadedness with hypotension: No Has patient had a PCN reaction causing severe rash involving mucus membranes or skin necrosis: No Has patient had a PCN reaction that required hospitalization No Has patient had a PCN reaction occurring within the last 10 years: No If all of the above answers are "NO", then may proceed with Cephalosporin use.     REVIEW OF SYSTEMS:   ROS As per history of present illness. All pertinent systems were reviewed above. Constitutional, HEENT, cardiovascular, respiratory, GI, GU, musculoskeletal, neuro, psychiatric, endocrine, integumentary and hematologic systems were reviewed and are otherwise negative/unremarkable except for positive findings mentioned above in the HPI.   MEDICATIONS AT HOME:   Prior to Admission medications   Medication Sig Start Date End Date Taking? Authorizing Provider  albuterol (VENTOLIN HFA) 108 (90 Base) MCG/ACT inhaler Inhale 2 puffs into the lungs every 6 (six) hours as needed for wheezing or shortness of breath. 01/09/21   [name], MD  amiodarone (PACERONE) 200 MG tablet Take 1 tablet (200 mg total) by mouth daily. 03/07/20   Max Sane, MD  apixaban (ELIQUIS) 5 MG TABS tablet Take 1 tablet (5 mg total) by mouth 2 (two) times daily. 07/20/21   [name], MD  atorvastatin (LIPITOR) 40 MG tablet TAKE ONE TABLET BY MOUTH ONE TIME DAILY 07/20/21   [name], MD  Continuous Blood Gluc Sensor (FREESTYLE LIBRE 3 SENSOR) MISC PLACE  ONE SENSOR ON THE BACK OF YOUR UPPER ARM EVERY 14 DAYS 09/29/21   [name], MD  cyanocobalamin (,VITAMIN B-12,) 1000 MCG/ML injection INJECT 1ML INTO THE MUSCLE ONCE A MONTH 08/21/21   Patel, Donika K, DO  hydrochlorothiazide (HYDRODIURIL) 25 MG tablet Take 1 tablet by mouth daily. 05/27/20   [provider]  levothyroxine (SYNTHROID) 175 MCG tablet Take 1 tablet (175 mcg total) by mouth daily. 07/20/21   [name], MD  metFORMIN (GLUCOPHAGE) 1000 MG tablet TAKE ONE TABLET BY MOUTH TWICE A DAY 07/10/21   [name], MD  metoprolol succinate (  TOPROL-XL) 50 MG 24 hr tablet Take 1 tablet (50 mg total) by mouth daily. 12/20/20   [name], MD  PARoxetine (PAXIL-CR) 25 MG 24 hr tablet TAKE 1 TABLET(25 MG) BY MOUTH DAILY 07/20/21   [name], MD  zolpidem (AMBIEN) 10 MG tablet Take 1 tablet (10 mg total) by mouth at bedtime as needed for sleep. 09/13/21   [name], MD      VITAL SIGNS:  Blood pressure (!) 141/68, pulse 73, temperature 98.3 F (36.8 C), temperature source Oral, resp. rate (!) 21, height 5' 7"  (1.702 m), weight 87.1 kg, SpO2 97 %.  PHYSICAL EXAMINATION:  Physical Exam  GENERAL: Acutely ill 66 y.o.-year-old Caucasian female patient lying in the bed with mild respiratory distress with mild conversational dyspnea.  EYES: Pupils equal, round, reactive to light and accommodation. No scleral icterus. Extraocular muscles intact.  HEENT: Head atraumatic, normocephalic. Oropharynx and nasopharynx clear.  NECK:  Supple, no jugular venous distention. No thyroid enlargement, no tenderness.  LUNGS: Diminished left basal breath sounds with mild crackles.  No use of accessory muscles of respiration.  CARDIOVASCULAR: Regular rate and rhythm, S1, S2 normal. No murmurs, rubs, or gallops.  ABDOMEN: Soft, nondistended, nontender. Bowel sounds present. No organomegaly or mass.  EXTREMITIES: 2+ bilateral lower extremity pitting edema, with no cyanosis, or clubbing.   NEUROLOGIC: Cranial nerves II through XII are intact. Muscle strength 5/5 in all extremities. Sensation intact. Gait not checked.  PSYCHIATRIC: The patient is alert and oriented x 3.  Normal affect and good eye contact. SKIN: No obvious rash, lesion, or ulcer.   LABORATORY PANEL:   CBC Recent Labs  Lab 09/29/21 1521  WBC 10.8*  HGB 11.3*  HCT 35.5*  PLT 298   ------------------------------------------------------------------------------------------------------------------  Chemistries  Recent Labs  Lab 09/29/21 1521  NA 139  K 3.1*  CL 103  CO2 26  GLUCOSE 135*  BUN 8  CREATININE 0.56  CALCIUM 9.2  AST 51*  ALT 51*  ALKPHOS 79  BILITOT 0.9   ------------------------------------------------------------------------------------------------------------------  Cardiac Enzymes No results for input(s): "TROPONINI" in the last 168 hours. ------------------------------------------------------------------------------------------------------------------  RADIOLOGY:  CT Angio Chest PE W and/or Wo Contrast  Result Date: 09/29/2021 CLINICAL DATA:  Pulmonary embolism (PE) suspected, high prob. Shortness of breath bilateral leg swelling EXAM: CT ANGIOGRAPHY CHEST WITH CONTRAST TECHNIQUE: Multidetector CT imaging of the chest was performed using the standard protocol during bolus administration of intravenous contrast. Multiplanar CT image reconstructions and MIPs were obtained to evaluate the vascular anatomy. RADIATION DOSE REDUCTION: This exam was performed according to the departmental dose-optimization program which includes automated exposure control, adjustment of the mA and/or kV according to patient size and/or use of iterative reconstruction technique. CONTRAST:  11m OMNIPAQUE IOHEXOL 350 MG/ML SOLN COMPARISON:  CT chest 04/23/2017 FINDINGS: Cardiovascular: Satisfactory opacification of the pulmonary arteries to the segmental level. No evidence of pulmonary embolism. The main  pulmonary artery is normal in caliber. Enlarged heart size. No significant pericardial effusion. The thoracic aorta is normal in caliber. Mild atherosclerotic plaque of the thoracic aorta. At least left anterior descending coronary artery calcifications. Mediastinum/Nodes: No enlarged mediastinal, hilar, or axillary lymph nodes. Thyroid gland, trachea, and esophagus demonstrate no significant findings. Lungs/Pleura: Expiratory phase of respiration. Left lower lobe consolidation may represent combination of passive atelectasis versus infection/inflammation. Right lower lobe atelectasis. No pulmonary nodule. No pulmonary mass. Trace left pleural effusion. No pneumothorax. Upper Abdomen: There is a 3.2 cm fluid dense lesion within the right kidney  that likely represents a simple renal cyst. Simple renal cysts, in the absence of clinically indicated signs/symptoms, require no independent follow-up. Musculoskeletal: No chest wall abnormality. No suspicious lytic or blastic osseous lesions. New from 2021, likely old healed minimally displaced 2-4 rib fractures. Age-indeterminate, likely acute, left displaced anterior 5-7 rib fractures as well as posteriorly displaced 6-7 rib fractures. No acute displaced fracture. Poorly visualized acute, comminuted, intra-articular left distal clavicular fracture. Review of the MIP images confirms the above findings. IMPRESSION: 1. No pulmonary embolus. 2. Trace left pleural effusion with left lower lobe consolidation may represent combination of passive atelectasis versus infection/inflammation. 3. Cardiomegaly. 4. Likely acute, left displaced anterior 5-7 rib fractures as well as posteriorly displaced 6-7 rib fractures (the 6 and 7 ribs are fractured in 2 different places). New from 2021, likely old healed minimally displaced 2-4 rib fractures. Recommend correlation with point tenderness to palpation to evaluate for an acute fracture. No associated pneumothorax. 5. Poorly visualized  acute, comminuted, intra-articular left distal clavicular fracture. Recommend dedicated left shoulder and clavicle radiographs. Electronically Signed   By: Iven Finn M.D.   On: 09/29/2021 15:20   DG Chest 2 View  Result Date: 09/29/2021 CLINICAL DATA:  Shortness of breath EXAM: CHEST - 2 VIEW COMPARISON:  01/10/2021 FINDINGS: Chronic interstitial changes. New patchy density at the left lung base. No pleural effusion. No pneumothorax. Similar cardiomediastinal contours. No acute osseous abnormality. IMPRESSION: Patchy left basilar atelectasis/consolidation. Electronically Signed   By: Macy Mis M.D.   On: 09/29/2021 10:39      IMPRESSION AND PLAN:  Assessment and Plan: * NSTEMI (non-ST elevated myocardial infarction) [facility]) - The patient will be admitted to a PCU bed. - We will continue her on IV heparin. - Cardiology consult will be obtained. - I notified Dr. Saralyn Pilar and Luetta Nutting Scoggins about the patient. - She will be on high-dose statin. - We will place her on aspirin as well as as needed sublingual nitroglycerin and IV morphine sulfate with BP stabilization. - Serial troponins will be followed.  Sepsis due to pneumonia [facility]) - This is manifested by leukocytosis and tachypnea. - The patient meets severe sepsis criteria given development of hypotension. - We will place on IV Rocephin and Zithromax. - We will hydrate with IV normal saline. - We will check lactic acid level. - We will follow blood and sputum cultures.  Paroxysmal atrial fibrillation (HCC) - We will holding off her Eliquis and continuing her on IV heparin. - We will continue amiodarone and Toprol-XL.  Hypothyroidism - We will continue Synthroid.  Type 2 diabetes mellitus without complications ([place]) - The patient will be placed on supplement coverage with NovoLog. - We will hold off metformin.  Dyslipidemia - We will continue statin therapy with higher dose.  Anxiety and depression - We will continue  Paxil.  Multiple rib fractures - She has left 8 rib fractures as well as left clavicular fracture secondary to recent fall. - Pain management will be provided.  OSA on CPAP We will continue CPAP nightly.   DVT prophylaxis: IV heparin. Advanced Care Planning:  Code Status: full code.  Family Communication:  The plan of care was discussed in details with the patient (and family). I answered all questions. The patient agreed to proceed with the above mentioned plan. Further management will depend upon hospital course. Disposition Plan: Back to previous home environment Consults called: Cardiology. All the records are reviewed and case discussed with ED provider.  Status is: Inpatient  At the time of the admission, it appears that the appropriate admission status for this patient is inpatient.  This is judged to be reasonable and necessary in order to provide the required intensity of service to ensure the patient's safety given the presenting symptoms, physical exam findings and initial radiographic and laboratory data in the context of comorbid conditions.  The patient requires inpatient status due to high intensity of service, high risk of further deterioration and high frequency of surveillance required.  I certify that at the time of admission, it is my clinical judgment that the patient will require inpatient hospital care extending more than 2 midnights.                            Dispo: The patient is from: Home              Anticipated d/c is to: Home              Patient currently is not medically stable to d/c.              Difficult to place patient: No  Christel Mormon M.D on 09/29/2021 at 6:04 PM  [facility]   From 7 PM-7 AM, contact night-coverage [email or web address]  CC: Primary care physician; [name], MD

## 2021-09-29 NOTE — ED Triage Notes (Signed)
Pt here with SOB and edema in both her legs. Pt was recently admitted in [place]. Pt also states she is having weeping from a wound on her right leg.

## 2021-09-29 NOTE — Consult Note (Signed)
[facility] Cardiology  CARDIOLOGY CONSULT NOTE  Patient ID: Kirsten [name] MRN: [id number] DOB/AGE: 1955/09/08 66 y.o.  Admit date: 09/29/2021 Referring Physician [facility] Primary Physician Ronnald Ramp Primary Cardiologist Humphrey Rolls Reason for Consultation elevated troponin  HPI: 66 year old female referred for evaluation of elevated troponin.  Patient has a history of paroxysmal atrial fibrillation, on Eliquis for stroke prevention, followed by Dr. Humphrey Rolls.  She was recently in [place], fell x-ray of multiple ribs clavicle hospitalized several days, discharged yesterday and drove home.  This morning, he patient noted worsening shortness of breath, cough and lower extremity edema and presented to [facility] ED.  ECG revealed sinus rhythm with nonspecific intraventricular conduction delay which did not appear changed compared to most recent ECG.  Lab work was notable for elevated high-sensitivity troponin (397, 1399).  The patient denies chest pain other than chest wall discomfort due to rib fractures.  Other notable lab work included elevated white count of 14,900.  Chest x-ray revealed patchy left basilar atelectasis.  ET scan did not reveal evidence for pulmonary embolus, did show left lower lobe consolidation, possible infection/inflammation, left displaced anterior 5-7 rib fractures, left distal clavicular fracture.  Review of systems complete and found to be negative unless listed above     Past Medical History:  Diagnosis Date   A-fib ([place])    Anxiety disorder    Asthma    Dyspnea    Gastroschisis    umphalocele, rotated organs abd repair until age 7   GERD (gastroesophageal reflux disease)    HTN (hypertension)    Hyperlipidemia    IBS (irritable bowel syndrome)    Morbid obesity (HCC)    Target wt - 185  for BMI < 30   Obesity    OSA on CPAP    SBO (small bowel obstruction) ([place])    Resolved with NG/Bowel rest around 2009   Sleep apnea    Type II or unspecified type diabetes mellitus without mention  of complication, not stated as uncontrolled     Past Surgical History:  Procedure Laterality Date   BREAST EXCISIONAL BIOPSY Left 03/19/2012   neg   [place]   COLONOSCOPY  2011   2011-normal   Newborn Surgery - GI - ORGANS OUTSIDE ABDOMEN     Small Bowel Repair     TUBAL LIGATION  1988    (Not in a hospital admission)  Social History   Socioeconomic History   Marital status: Married    Spouse name: Not on file   Number of children: 3   Years of education: Not on file   Highest education level: Not on file  Occupational History   Occupation: Retired    Comment: Research scientist (physical sciences) at [employer]  Tobacco Use   Smoking status: Never   Smokeless tobacco: Never   Tobacco comments:    "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 Use: Never used  Substance and Sexual Activity   Alcohol use: Yes    Alcohol/week: 2.0 standard drinks of alcohol    Types: 2 Glasses of wine per week    Comment: rare   Drug use: No   Sexual activity: Yes    Birth control/protection: Surgical  Other Topics Concern   Not on file  Social History Narrative   Regular Exercise -  YES   Daily Caffeine Use:  2   Active and independent at baseline   Right handed   Lives in a one  story home      Social Determinants of Health   Financial Resource Strain: Not on file  Food Insecurity: Not on file  Transportation Needs: Not on file  Physical Activity: Insufficiently Active (01/31/2017)   Exercise Vital Sign    Days of Exercise per Week: 2 days    Minutes of Exercise per Session: 30 min  Stress: Not on file  Social Connections: Not on file  Intimate Partner Violence: Not on file    Family History  Problem Relation Age of Onset   Lymphoma Mother    Diabetes type II Father    Colon cancer Father    Diabetes Maternal Grandmother    Diabetes type I Son    Goiter Neg Hx    Breast cancer Neg Hx    Ovarian cancer Neg Hx       Review of systems complete and  found to be negative unless listed above      PHYSICAL EXAM  General: Well developed, well nourished, in no acute distress HEENT:  Normocephalic and atramatic Neck:  No JVD.  Lungs: Clear bilaterally to auscultation and percussion. Heart: HRRR . Normal S1 and S2 without gallops or murmurs.  Abdomen: Bowel sounds are positive, abdomen soft and non-tender  Msk:  Back normal, normal gait. Normal strength and tone for age. Extremities: No clubbing, cyanosis or edema.   Neuro: Alert and oriented X 3. Psych:  Good affect, responds appropriately  Labs:   Lab Results  Component Value Date   WBC 14.9 (H) 09/29/2021   HGB 13.0 09/29/2021   HCT 41.4 09/29/2021   MCV 91.4 09/29/2021   PLT 362 09/29/2021    Recent Labs  Lab 09/29/21 1012  NA 140  K 3.2*  CL 104  CO2 25  BUN 10  CREATININE 0.65  CALCIUM 10.1  PROT 7.2  BILITOT 1.0  ALKPHOS 101  ALT 60*  AST 60*  GLUCOSE 170*   Lab Results  Component Value Date   CKTOTAL 38 10/18/2020   TROPONINI <0.03 04/23/2017    Lab Results  Component Value Date   CHOL 146 07/20/2021   CHOL 120 07/19/2020   CHOL 133 07/28/2019   Lab Results  Component Value Date   HDL 53.70 07/20/2021   HDL 45.00 07/19/2020   HDL 44 07/28/2019   Lab Results  Component Value Date   LDLCALC 66 07/20/2021   LDLCALC 48 07/19/2020   LDLCALC 29 07/28/2019   Lab Results  Component Value Date   TRIG 127.0 07/20/2021   TRIG 131.0 07/19/2020   TRIG 80 03/01/2020   Lab Results  Component Value Date   CHOLHDL 3 07/20/2021   CHOLHDL 3 07/19/2020   CHOLHDL 3 01/15/2019   Lab Results  Component Value Date   LDLDIRECT 63.0 01/15/2019   LDLDIRECT 56.0 03/20/2018   LDLDIRECT 119.0 03/21/2017      Radiology: CT Angio Chest PE W and/or Wo Contrast  Result Date: 09/29/2021 CLINICAL DATA:  Pulmonary embolism (PE) suspected, high prob. Shortness of breath bilateral leg swelling EXAM: CT ANGIOGRAPHY CHEST WITH CONTRAST TECHNIQUE: Multidetector CT  imaging of the chest was performed using the standard protocol during bolus administration of intravenous contrast. Multiplanar CT image reconstructions and MIPs were obtained to evaluate the vascular anatomy. RADIATION DOSE REDUCTION: This exam was performed according to the departmental dose-optimization program which includes automated exposure control, adjustment of the mA and/or kV according to patient size and/or use of iterative reconstruction technique. CONTRAST:  4m OMNIPAQUE IOHEXOL  350 MG/ML SOLN COMPARISON:  CT chest 04/23/2017 FINDINGS: Cardiovascular: Satisfactory opacification of the pulmonary arteries to the segmental level. No evidence of pulmonary embolism. The main pulmonary artery is normal in caliber. Enlarged heart size. No significant pericardial effusion. The thoracic aorta is normal in caliber. Mild atherosclerotic plaque of the thoracic aorta. At least left anterior descending coronary artery calcifications. Mediastinum/Nodes: No enlarged mediastinal, hilar, or axillary lymph nodes. Thyroid gland, trachea, and esophagus demonstrate no significant findings. Lungs/Pleura: Expiratory phase of respiration. Left lower lobe consolidation may represent combination of passive atelectasis versus infection/inflammation. Right lower lobe atelectasis. No pulmonary nodule. No pulmonary mass. Trace left pleural effusion. No pneumothorax. Upper Abdomen: There is a 3.2 cm fluid dense lesion within the right kidney that likely represents a simple renal cyst. Simple renal cysts, in the absence of clinically indicated signs/symptoms, require no independent follow-up. Musculoskeletal: No chest wall abnormality. No suspicious lytic or blastic osseous lesions. New from 2021, likely old healed minimally displaced 2-4 rib fractures. Age-indeterminate, likely acute, left displaced anterior 5-7 rib fractures as well as posteriorly displaced 6-7 rib fractures. No acute displaced fracture. Poorly visualized acute,  comminuted, intra-articular left distal clavicular fracture. Review of the MIP images confirms the above findings. IMPRESSION: 1. No pulmonary embolus. 2. Trace left pleural effusion with left lower lobe consolidation may represent combination of passive atelectasis versus infection/inflammation. 3. Cardiomegaly. 4. Likely acute, left displaced anterior 5-7 rib fractures as well as posteriorly displaced 6-7 rib fractures (the 6 and 7 ribs are fractured in 2 different places). New from 2021, likely old healed minimally displaced 2-4 rib fractures. Recommend correlation with point tenderness to palpation to evaluate for an acute fracture. No associated pneumothorax. 5. Poorly visualized acute, comminuted, intra-articular left distal clavicular fracture. Recommend dedicated left shoulder and clavicle radiographs. Electronically Signed   By: Iven Finn M.D.   On: 09/29/2021 15:20   DG Chest 2 View  Result Date: 09/29/2021 CLINICAL DATA:  Shortness of breath EXAM: CHEST - 2 VIEW COMPARISON:  01/10/2021 FINDINGS: Chronic interstitial changes. New patchy density at the left lung base. No pleural effusion. No pneumothorax. Similar cardiomediastinal contours. No acute osseous abnormality. IMPRESSION: Patchy left basilar atelectasis/consolidation. Electronically Signed   By: Macy Mis M.D.   On: 09/29/2021 10:39   MM 3D SCREEN BREAST BILATERAL  Result Date: 09/01/2021 CLINICAL DATA:  Screening. EXAM: DIGITAL SCREENING BILATERAL MAMMOGRAM WITH TOMOSYNTHESIS AND CAD TECHNIQUE: Bilateral screening digital craniocaudal and mediolateral oblique mammograms were obtained. Bilateral screening digital breast tomosynthesis was performed. The images were evaluated with computer-aided detection. COMPARISON:  Previous exam(s). ACR Breast Density Category a: The breast tissue is almost entirely fatty. FINDINGS: There are no findings suspicious for malignancy. IMPRESSION: No mammographic evidence of malignancy. A result  letter of this screening mammogram will be mailed directly to the patient. RECOMMENDATION: Screening mammogram in one year. (Code:[id number]-B-01Y) BI-RADS CATEGORY  1: Negative. Electronically Signed   By: Evangeline Dakin M.D.   On: 09/01/2021 14:36    EKG: Sinus rhythm with nonspecific intraventricular conduction delay  ASSESSMENT AND PLAN:   1.  Elevated troponin (397, 1399), possible NSTEMI, in the absence of chest pain, with nondiagnostic ECG, in the setting of recent rib fractures and clavicular fracture, possible underlying pneumonia 2.  Possible hospital-acquired pneumonia following recent hospitalization in [place] for multiple rib and clavicular fracture with chest x-ray and chest CT revealing left basilar consolidation, with elevated white count 3.  Paroxysmal atrial fibrillation, on Eliquis for stroke prevention, currently in sinus  rhythm  Recommendations  1.  Agree with current therapy 2.  Hold Eliquis for now 3.  Continue heparin drip 4.  Cycle cardiac enzymes 5.  2D echocardiogram 6.  Further recommendations, including possible cardiac catheterization, pending patient's medical progress, additional high-sensitivity troponin results, and 2D echocardiogram findings  Signed: Isaias Cowman MD,PhD, [facility] 09/29/2021, 3:44 PM

## 2021-09-29 NOTE — Assessment & Plan Note (Addendum)
-   She has left 8 rib fractures as well as left clavicular fracture secondary to recent fall. - Pain management will be provided.

## 2021-09-29 NOTE — Progress Notes (Signed)
Patient arrived floor at 8:45 pm. Patient is on O2 at 3L,  Heparin at 10.5 and KCL at 100 ml/hr. Scattered bruising on body and erythema and bruising on left shoulder. Abrasion on RLE on shin. 20 G IV in Right AC placed. Heparin going in Left and Kcl in right.  Patient states pain. Has requested CPAP at 10 pm.

## 2021-09-29 NOTE — Progress Notes (Signed)
Initiated pt on her home cpap

## 2021-09-29 NOTE — Sepsis Progress Note (Signed)
Antibiotics ordered and given before Code sepsis ordered

## 2021-09-29 NOTE — Progress Notes (Signed)
[facility] for initiation & monitoring of heparin infusion Indication: chest pain/ACS  Allergies  Allergen Reactions   Doxycycline Rash   Factive [Gemifloxacin Mesylate] Rash   Crestor [Rosuvastatin]     GI upset   Sulfonamide Derivatives     REACTION: rash   Gemifloxacin Rash   Penicillins Hives and Rash    Has patient had a PCN reaction causing immediate rash, facial/tongue/throat swelling, SOB or lightheadedness with hypotension: No Has patient had a PCN reaction causing severe rash involving mucus membranes or skin necrosis: No Has patient had a PCN reaction that required hospitalization No Has patient had a PCN reaction occurring within the last 10 years: No If all of the above answers are "NO", then may proceed with Cephalosporin use.     Patient Measurements: Height: 5' 7"  (170.2 cm) Weight: 87.1 kg (192 lb 0.3 oz) IBW/kg (Calculated) : 61.6 Heparin Dosing Weight: 80 kg  Vital Signs: Temp: 98.2 F (36.8 C) (08/18 2056) Temp Source: Oral (08/18 1630) BP: 112/64 (08/18 2056) Pulse Rate: 73 (08/18 2056)  Labs: Recent Labs    09/29/21 1012 09/29/21 1040 09/29/21 1236 09/29/21 1521 09/29/21 2105  HGB 13.0  --   --  11.3*  --   HCT 41.4  --   --  35.5*  --   PLT 362  --   --  298  --   APTT  --  35  --  118* 52*  LABPROT  --   --   --  17.5*  --   INR  --   --   --  1.4*  --   HEPARINUNFRC  --   --   --  >1.10*  --   CREATININE 0.65  --   --  0.56  --   TROPONINIHS 397*  --  1,399* 1,665*  --      Estimated Creatinine Clearance: 80.5 mL/min (by C-G formula based on SCr of 0.56 mg/dL).   Medical History: Past Medical History:  Diagnosis Date   A-fib ([place])    Anxiety disorder    Asthma    Dyspnea    Gastroschisis    umphalocele, rotated organs abd repair until age 34   GERD (gastroesophageal reflux disease)    HTN (hypertension)    Hyperlipidemia    IBS (irritable bowel syndrome)    Morbid obesity (HCC)     Target wt - 185  for BMI < 30   Obesity    OSA on CPAP    SBO (small bowel obstruction) ([place])    Resolved with NG/Bowel rest around 2009   Sleep apnea    Type II or unspecified type diabetes mellitus without mention of complication, not stated as uncontrolled     Assessment:  66 y.o. female w/ PMH of  atrial fibrillation, asthma, GERD, hypertension, dyslipidemia, IBS, OSA on CPAP, type 2 diabetes mellitus and dyslipidemia,obesity having increasing shortness of breath and swelling in both her legs with presumed NSTEMI. She is noted to take apixaban with last dose this morning 09/29/21.  Goal of Therapy:  Heparin level 0.3-0.7 units/ml aPTT 66 - 102  seconds Monitor platelets by anticoagulation protocol: Yes   Plan:  8/18:  aPTT @ 2105 = 52, SUBtherapeutic Will order heparin 2000 units IV X 1 bolus and increase drip rate to 1350 units/hr.  Will recheck aPTT 6 hrs after rate change.  aPTT level will be used to guide therapy until aPTT and heparin levels correlate Continue to  monitor H&H and platelets  Becker Christopher D 09/29/2021,9:39 PM

## 2021-09-29 NOTE — Assessment & Plan Note (Addendum)
-   The patient will be admitted to a PCU bed. - We will continue her on IV heparin. - Cardiology consult will be obtained. - I notified Dr. Saralyn Pilar and Luetta Nutting Scoggins about the patient. - She will be on high-dose statin. - We will place her on aspirin as well as as needed sublingual nitroglycerin and IV morphine sulfate with BP stabilization. - Serial troponins will be followed.

## 2021-09-29 NOTE — ED Notes (Signed)
Informed RN bed assigned 

## 2021-09-29 NOTE — Assessment & Plan Note (Signed)
-   We will continue Synthroid. 

## 2021-09-29 NOTE — ED Notes (Signed)
Dr. Sidney Ace at bedside.

## 2021-09-30 ENCOUNTER — Inpatient Hospital Stay: Payer: PPO

## 2021-09-30 ENCOUNTER — Inpatient Hospital Stay
Admit: 2021-09-30 | Discharge: 2021-09-30 | Disposition: A | Discharge status: Home / Self Care | Payer: PPO | Attending: Cardiology | Admitting: Cardiology

## 2021-09-30 ENCOUNTER — Encounter: Payer: Self-pay | Admitting: Family Medicine

## 2021-09-30 DIAGNOSIS — I214 Non-ST elevation (NSTEMI) myocardial infarction: Secondary | ICD-10-CM | POA: Diagnosis not present

## 2021-09-30 LAB — GLUCOSE, CAPILLARY
Glucose-Capillary: 159 mg/dL — ABNORMAL HIGH (ref 70–99)
Glucose-Capillary: 140 mg/dL — ABNORMAL HIGH (ref 70–99)

## 2021-09-30 LAB — STREP PNEUMONIAE URINARY ANTIGEN: Strep Pneumo Urinary Antigen: NEGATIVE

## 2021-09-30 LAB — BASIC METABOLIC PANEL
Anion gap: 9 (ref 5–15)
Anion gap: 15 (ref 5–15)
BUN: 8 mg/dL (ref 8–23)
BUN: 8 mg/dL (ref 8–23)
CO2: 25 mmol/L (ref 22–32)
CO2: 23 mmol/L (ref 22–32)
Calcium: 9 mg/dL (ref 8.9–10.3)
Calcium: 9.4 mg/dL (ref 8.9–10.3)
Chloride: 105 mmol/L (ref 98–111)
Chloride: 100 mmol/L (ref 98–111)
Creatinine, Ser: 0.61 mg/dL (ref 0.44–1.00)
Creatinine, Ser: 0.8 mg/dL (ref 0.44–1.00)
GFR, Estimated: >60 mL/min (ref >60)
GFR, Estimated: >60 mL/min (ref >60)
Glucose, Bld: 181 mg/dL — ABNORMAL HIGH (ref 70–99)
Glucose, Bld: 162 mg/dL — ABNORMAL HIGH (ref 70–99)
Potassium: 3.3 mmol/L — ABNORMAL LOW (ref 3.5–5.1)
Potassium: 3.4 mmol/L — ABNORMAL LOW (ref 3.5–5.1)
Sodium: 139 mmol/L (ref 135–145)
Sodium: 138 mmol/L (ref 135–145)

## 2021-09-30 LAB — ECHOCARDIOGRAM COMPLETE
Height: 67 in
Weight: 3072.33 oz

## 2021-09-30 LAB — LIPID PANEL
Cholesterol: 83 mg/dL (ref 0–200)
HDL: 37 mg/dL — ABNORMAL LOW (ref >40)
LDL Cholesterol: 32 mg/dL (ref 0–99)
Total CHOL/HDL Ratio: 2.2 RATIO
Triglycerides: 70 mg/dL (ref <150)
VLDL: 14 mg/dL (ref 0–40)

## 2021-09-30 LAB — CBC
HCT: 34.6 % — ABNORMAL LOW (ref 36.0–46.0)
Hemoglobin: 10.9 g/dL — ABNORMAL LOW (ref 12.0–15.0)
MCH: 29.5 pg (ref 26.0–34.0)
MCHC: 31.5 g/dL (ref 30.0–36.0)
MCV: 93.5 fL (ref 80.0–100.0)
Platelets: 294 10*3/uL (ref 150–400)
RBC: 3.7 MIL/uL — ABNORMAL LOW (ref 3.87–5.11)
RDW: 14.8 % (ref 11.5–15.5)
WBC: 15 10*3/uL — ABNORMAL HIGH (ref 4.0–10.5)
nRBC: 0 % (ref 0.0–0.2)

## 2021-09-30 LAB — TROPONIN I (HIGH SENSITIVITY): Troponin I (High Sensitivity): 573 ng/L (ref <18)

## 2021-09-30 LAB — MAGNESIUM: Magnesium: 1.8 mg/dL (ref 1.7–2.4)

## 2021-09-30 LAB — APTT
aPTT: 72 seconds — ABNORMAL HIGH (ref 24–36)
aPTT: 90 seconds — ABNORMAL HIGH (ref 24–36)

## 2021-09-30 LAB — POTASSIUM: Potassium: 4.1 mmol/L (ref 3.5–5.1)

## 2021-09-30 LAB — HEPARIN LEVEL (UNFRACTIONATED): Heparin Unfractionated: 0.99 IU/mL — ABNORMAL HIGH (ref 0.30–0.70)

## 2021-09-30 MED ORDER — PERFLUTREN LIPID MICROSPHERE
1.0000 mL | INTRAVENOUS | Status: AC | PRN
  Administered 2021-09-30: 3 mL via INTRAVENOUS

## 2021-09-30 MED ORDER — IPRATROPIUM-ALBUTEROL 0.5-2.5 (3) MG/3ML IN SOLN
RESPIRATORY_TRACT | Status: AC
  Filled 2021-09-30: qty 3

## 2021-09-30 MED ORDER — FUROSEMIDE 10 MG/ML IJ SOLN
60.0000 mg | Freq: Once | INTRAMUSCULAR | Status: AC
Start: 2021-09-30 — End: 2021-09-30
  Administered 2021-09-30: 60 mg via INTRAVENOUS

## 2021-09-30 MED ORDER — SODIUM CHLORIDE 0.9 % IV SOLN
250.0000 mL | INTRAVENOUS | Status: DC | PRN
  Administered 2021-10-02: 250 mL via INTRAVENOUS

## 2021-09-30 MED ORDER — ASPIRIN 81 MG PO CHEW
81.0000 mg | CHEWABLE_TABLET | ORAL | Status: AC
  Administered 2021-10-01: 81 mg via ORAL
  Filled 2021-09-30: qty 1

## 2021-09-30 MED ORDER — POTASSIUM CHLORIDE CRYS ER 20 MEQ PO TBCR
60.0000 meq | EXTENDED_RELEASE_TABLET | Freq: Once | ORAL | Status: AC
  Administered 2021-09-30: 60 meq via ORAL
  Filled 2021-09-30: qty 3

## 2021-09-30 MED ORDER — SODIUM CHLORIDE 0.9 % WEIGHT BASED INFUSION: 3.0000 mL/kg/h | INTRAVENOUS | Status: AC

## 2021-09-30 MED ORDER — FUROSEMIDE 10 MG/ML IJ SOLN
INTRAMUSCULAR | Status: AC
  Filled 2021-09-30: qty 6

## 2021-09-30 MED ORDER — SODIUM CHLORIDE 0.9% FLUSH: 3.0000 mL | INTRAVENOUS | Status: DC | PRN

## 2021-09-30 MED ORDER — ALBUTEROL SULFATE (2.5 MG/3ML) 0.083% IN NEBU: 2.5000 mg | INHALATION_SOLUTION | RESPIRATORY_TRACT | Status: DC | PRN

## 2021-09-30 MED ORDER — SODIUM CHLORIDE 0.9 % IV SOLN
2.0000 g | Freq: Three times a day (TID) | INTRAVENOUS | Status: DC
  Administered 2021-09-30 – 2021-10-03 (×9): 2 g via INTRAVENOUS
  Filled 2021-09-30: qty 2
  Filled 2021-09-30 (×9): qty 12.5

## 2021-09-30 MED ORDER — SODIUM CHLORIDE 0.9% FLUSH
3.0000 mL | Freq: Two times a day (BID) | INTRAVENOUS | Status: DC
  Administered 2021-09-30 – 2021-10-06 (×11): 3 mL via INTRAVENOUS

## 2021-09-30 MED ORDER — IPRATROPIUM-ALBUTEROL 0.5-2.5 (3) MG/3ML IN SOLN
3.0000 mL | RESPIRATORY_TRACT | Status: DC
  Administered 2021-09-30 – 2021-10-02 (×12): 3 mL via RESPIRATORY_TRACT
  Filled 2021-09-30 (×11): qty 3

## 2021-09-30 MED ORDER — SODIUM CHLORIDE 0.9 % WEIGHT BASED INFUSION: 1.0000 mL/kg/h | INTRAVENOUS | Status: DC

## 2021-09-30 MED ORDER — FUROSEMIDE 10 MG/ML IJ SOLN
40.0000 mg | Freq: Once | INTRAMUSCULAR | Status: AC
Start: 2021-09-30 — End: 2021-09-30
  Administered 2021-09-30: 40 mg via INTRAVENOUS
  Filled 2021-09-30: qty 4

## 2021-09-30 MED ORDER — ATORVASTATIN CALCIUM 20 MG PO TABS: 40.0000 mg | ORAL_TABLET | Freq: Every day | ORAL | Status: DC

## 2021-09-30 NOTE — Progress Notes (Signed)
[facility] Cardiology  SUBJECTIVE: Patient laying in bed, denies chest pain   Vitals:   09/30/21 0421 09/30/21 0518 09/30/21 0750 09/30/21 0805  BP:  129/61  (!) 102/58  Pulse:  86 89 93  Resp:  18 20 (!) 22  Temp:  98 F (36.7 C)  98 F (36.7 C)  TempSrc:    Oral  SpO2: 95% 93% 92% 93%  Weight:      Height:         Intake/Output Summary (Last 24 hours) at 09/30/2021 6553 Last data filed at 09/30/2021 0421 Gross per 24 hour  Intake 1572.36 ml  Output --  Net 1572.36 ml      PHYSICAL EXAM  General: Well developed, well nourished, in no acute distress HEENT:  Normocephalic and atramatic Neck:  No JVD.  Lungs: Clear bilaterally to auscultation and percussion. Heart: HRRR . Normal S1 and S2 without gallops or murmurs.  Abdomen: Bowel sounds are positive, abdomen soft and non-tender  Msk:  Back normal, normal gait. Normal strength and tone for age. Extremities: No clubbing, cyanosis or edema.   Neuro: Alert and oriented X 3. Psych:  Good affect, responds appropriately   LABS: Basic Metabolic Panel: Recent Labs    09/29/21 1521 09/30/21 0431  NA 139 139  K 3.1* 3.3*  CL 103 105  CO2 26 25  GLUCOSE 135* 181*  BUN 8 8  CREATININE 0.56 0.61  CALCIUM 9.2 9.0   Liver Function Tests: Recent Labs    09/29/21 1012 09/29/21 1521  AST 60* 51*  ALT 60* 51*  ALKPHOS 101 79  BILITOT 1.0 0.9  PROT 7.2 5.8*  ALBUMIN 3.9 3.0*   No results for input(s): "LIPASE", "AMYLASE" in the last 72 hours. CBC: Recent Labs    09/29/21 1521 09/30/21 0431  WBC 10.8* 15.0*  NEUTROABS 8.4*  --   HGB 11.3* 10.9*  HCT 35.5* 34.6*  MCV 91.7 93.5  PLT 298 294   Cardiac Enzymes: No results for input(s): "CKTOTAL", "CKMB", "CKMBINDEX", "TROPONINI" in the last 72 hours. BNP: Invalid input(s): "POCBNP" D-Dimer: No results for input(s): "DDIMER" in the last 72 hours. Hemoglobin A1C: No results for input(s): "HGBA1C" in the last 72 hours. Fasting Lipid Panel: Recent Labs     09/30/21 0431  CHOL 83  HDL 37*  LDLCALC 32  TRIG 70  CHOLHDL 2.2   Thyroid Function Tests: No results for input(s): "TSH", "T4TOTAL", "T3FREE", "THYROIDAB" in the last 72 hours.  Invalid input(s): "FREET3" Anemia Panel: No results for input(s): "VITAMINB12", "FOLATE", "FERRITIN", "TIBC", "IRON", "RETICCTPCT" in the last 72 hours.  DG Chest Port 1 View  Result Date: 09/30/2021 CLINICAL DATA:  Dyspnea left shoulder pain. EXAM: PORTABLE CHEST 1 VIEW COMPARISON:  09/29/2021 FINDINGS: 0837 hours. The cardio pericardial silhouette is enlarged. Vascular congestion with diffuse interstitial opacity suggests edema. Minimal bibasilar atelectasis/infiltrate noted. Bones are diffusely demineralized. Telemetry leads overlie the chest. IMPRESSION: 1. Cardiomegaly with vascular congestion and diffuse interstitial opacity suggesting edema. 2. Minimal bibasilar atelectasis/infiltrate. Electronically Signed   By: Misty Stanley M.D.   On: 09/30/2021 09:21   CT Angio Chest PE W and/or Wo Contrast  Result Date: 09/29/2021 CLINICAL DATA:  Pulmonary embolism (PE) suspected, high prob. Shortness of breath bilateral leg swelling EXAM: CT ANGIOGRAPHY CHEST WITH CONTRAST TECHNIQUE: Multidetector CT imaging of the chest was performed using the standard protocol during bolus administration of intravenous contrast. Multiplanar CT image reconstructions and MIPs were obtained to evaluate the vascular anatomy. RADIATION DOSE REDUCTION: This  exam was performed according to the departmental dose-optimization program which includes automated exposure control, adjustment of the mA and/or kV according to patient size and/or use of iterative reconstruction technique. CONTRAST:  14m OMNIPAQUE IOHEXOL 350 MG/ML SOLN COMPARISON:  CT chest 04/23/2017 FINDINGS: Cardiovascular: Satisfactory opacification of the pulmonary arteries to the segmental level. No evidence of pulmonary embolism. The main pulmonary artery is normal in caliber.  Enlarged heart size. No significant pericardial effusion. The thoracic aorta is normal in caliber. Mild atherosclerotic plaque of the thoracic aorta. At least left anterior descending coronary artery calcifications. Mediastinum/Nodes: No enlarged mediastinal, hilar, or axillary lymph nodes. Thyroid gland, trachea, and esophagus demonstrate no significant findings. Lungs/Pleura: Expiratory phase of respiration. Left lower lobe consolidation may represent combination of passive atelectasis versus infection/inflammation. Right lower lobe atelectasis. No pulmonary nodule. No pulmonary mass. Trace left pleural effusion. No pneumothorax. Upper Abdomen: There is a 3.2 cm fluid dense lesion within the right kidney that likely represents a simple renal cyst. Simple renal cysts, in the absence of clinically indicated signs/symptoms, require no independent follow-up. Musculoskeletal: No chest wall abnormality. No suspicious lytic or blastic osseous lesions. New from 2021, likely old healed minimally displaced 2-4 rib fractures. Age-indeterminate, likely acute, left displaced anterior 5-7 rib fractures as well as posteriorly displaced 6-7 rib fractures. No acute displaced fracture. Poorly visualized acute, comminuted, intra-articular left distal clavicular fracture. Review of the MIP images confirms the above findings. IMPRESSION: 1. No pulmonary embolus. 2. Trace left pleural effusion with left lower lobe consolidation may represent combination of passive atelectasis versus infection/inflammation. 3. Cardiomegaly. 4. Likely acute, left displaced anterior 5-7 rib fractures as well as posteriorly displaced 6-7 rib fractures (the 6 and 7 ribs are fractured in 2 different places). New from 2021, likely old healed minimally displaced 2-4 rib fractures. Recommend correlation with point tenderness to palpation to evaluate for an acute fracture. No associated pneumothorax. 5. Poorly visualized acute, comminuted, intra-articular left  distal clavicular fracture. Recommend dedicated left shoulder and clavicle radiographs. Electronically Signed   By: MIven FinnM.D.   On: 09/29/2021 15:20   DG Chest 2 View  Result Date: 09/29/2021 CLINICAL DATA:  Shortness of breath EXAM: CHEST - 2 VIEW COMPARISON:  01/10/2021 FINDINGS: Chronic interstitial changes. New patchy density at the left lung base. No pleural effusion. No pneumothorax. Similar cardiomediastinal contours. No acute osseous abnormality. IMPRESSION: Patchy left basilar atelectasis/consolidation. Electronically Signed   By: PMacy MisM.D.   On: 09/29/2021 10:39     Echo pending  TELEMETRY: Sinus rhythm:  ASSESSMENT AND PLAN:  Principal Problem:   NSTEMI (non-ST elevated myocardial infarction) ([place] Active Problems:   Hypothyroidism   Essential hypertension, benign   OSA on CPAP   Paroxysmal atrial fibrillation (HCC)   Class 1 obesity with serious comorbidity and body mass index (BMI) of 34.0 to 34.9 in adult   Sepsis due to pneumonia ([place]   Dyslipidemia   Type 2 diabetes mellitus without complications ([place]   Anxiety and depression   Multiple rib fractures    1.  NSTEMI (397, 1399, 1665), in the absence of chest pain, with nondiagnostic ECG, in the setting of recent rib fractures and clavicular fracture, possible underlying pneumonia 2.  Possible hospital-acquired pneumonia [place] and [place] with chest x-ray and chest CT revealing left basilar consolidation, with elevated white count 3.  Paroxysmal atrial fibrillation, on Eliquis for stroke prevention, currently in sinus rhythm   Recommendations  1.  Agree with current therapy 2.  Hold Eliquis for now 3.  Continue heparin drip 4.  Cardiac catheterization with selective coronary arteriography scheduled for 10/03/2021.  The risk, benefits and alternatives of cardiac catheterization and possible PCI were explained to the patient and  informed consent was obtained.     Isaias Cowman, MD, PhD, [facility] 09/30/2021 9:27 AM

## 2021-09-30 NOTE — Significant Event (Signed)
Rapid Response Event Note   Reason for Call : called for pt SOB and 02 sats in 80's...   Initial Focused Assessment: Sitting up in bed, 02 sats low 90's, hypertensive at this time, Dr Si Raider to bedside.      Interventions: Changed 02 method to Bipap, another 60 mg lasix given, inserted foley, stat ekg.   Plan of Care: as above, RN Sarah to call for further assistance    Event Summary: as above  MD Notified: [facility] 1100 Call Bethany A, RN

## 2021-09-30 NOTE — Consult Note (Addendum)
Pharmacy Antibiotic Note  Kirsten [name] is a 66 y.o. female admitted on 09/29/2021 with healthcare associated pneumonia and sepsis. PMH significant for AFib on Eliquis, asthma, GERD, HTN, HLD, IBS, OSA on CPAP, T2DM and obesity. WBC uptrending, afebrile, procalcitonin <0.10, lactate WNL. 09/30/2021 CXR WNL, 09/30/2021 CT Chest pending. Patient has listed allergy to PCN but has tolerated cephalosporins in the past. Pharmacy has been consulted for cefepime dosing.  Plan: Initiate cefepime 2g Q8H. Monitor renal function while on cefepime. Follow up culture results.  Height: 5' 7"  (170.2 cm) Weight: 87.1 kg (192 lb 0.3 oz) IBW/kg (Calculated) : 61.6  Temp (24hrs), Avg:98.2 F (36.8 C), Min:98 F (36.7 C), Max:98.6 F (37 C)  Recent Labs  Lab 09/29/21 1012 09/29/21 1521 09/29/21 2105 09/30/21 0431  WBC 14.9* 10.8*  --  15.0*  CREATININE 0.65 0.56  --  0.61  LATICACIDVEN  --  0.8 1.0  --     Estimated Creatinine Clearance: 80.5 mL/min (by C-G formula based on SCr of 0.61 mg/dL).    Allergies  Allergen Reactions   Doxycycline Rash   Factive [Gemifloxacin Mesylate] Rash   Crestor [Rosuvastatin]     GI upset   Sulfonamide Derivatives     REACTION: rash   Gemifloxacin Rash   Penicillins Hives and Rash    Has patient had a PCN reaction causing immediate rash, facial/tongue/throat swelling, SOB or lightheadedness with hypotension: No Has patient had a PCN reaction causing severe rash involving mucus membranes or skin necrosis: No Has patient had a PCN reaction that required hospitalization No Has patient had a PCN reaction occurring within the last 10 years: No If all of the above answers are "NO", then may proceed with Cephalosporin use.     Antimicrobials this admission: 09/29/2021 Azithromycin 500 mg x1 09/29/2021 Ceftriaxone 2g x1 09/29/2021 Cefepime >>  Dose adjustments this admission: N/A  Microbiology results: 09/29/2021 BCx: NG<12H 09/30/2021 Sputum Cx:  ordered 09/30/2021 Strep pneumo urinary antigen: ordered  Thank you for allowing pharmacy to be a part of this patient's care.  Gretel Acre, PharmD PGY1 Pharmacy Resident 09/30/2021 9:39 AM

## 2021-09-30 NOTE — Progress Notes (Addendum)
PROGRESS NOTE    Kirsten [name]  PZW:[id number] DOB: March 24, 1955 DOA: 09/29/2021 PCP: [name], MD     Brief Narrative:   From admission h and p Kirsten [name] is a 66 y.o. Caucasian female with medical history significant for atrial fibrillation on Eliquis, asthma, GERD, hypertension, dyslipidemia, IBS, OSA on CPAP, type 2 diabetes mellitus and dyslipidemia and obesity, who presented to the ER  who was in [place] and fell with subsequent left-sided 8 ribs fracture as well as left clavicular fracture for which she was hospitalized for several days and had apparently had hypercarbic respiratory failure that was managed with improvement she was discharged yesterday and drove home.  This morning she was having increasing dyspnea and worsening lower extremity edema with no previous history of CHF.  She admits to left upper back pain that is not different from her pain with ribs and clavicular fracture.  She has been having mild cough with inability to expectorate though she was trying to clear her throat.  She admits to occasional wheezing and has been fatigued and tired.  She felt hot and chilly but did not have any reported fever.  No nausea or vomiting or abdominal pain or diarrhea or melena or bright red bleeding per rectum.  No other bleeding diathesis.  She denies any fever or chills.  She has a healing clean right leg sutured wound.   Assessment & Plan:   Principal Problem:   NSTEMI (non-ST elevated myocardial infarction) ([place]) Active Problems:   Paroxysmal atrial fibrillation (HCC)   Sepsis due to pneumonia ([place])   Hypothyroidism   Dyslipidemia   Type 2 diabetes mellitus without complications ([place])   Anxiety and depression   Essential hypertension, benign   OSA on CPAP   Class 1 obesity with serious comorbidity and body mass index (BMI) of 34.0 to 34.9 in adult   Multiple rib fractures  Addendum 11:00 Rapid response for respiratory distress. CXR earlier this morning  showed vascular congestion. Had received fluids overnight which were stopped earlier this morning and 40 of iv lasix given. Troponin this morning improving. For worsening respiratory distress will give additional 60 of IV lasix and place on bipap. Confirmed with patient she would want intubation should that prove necessary. Will reassess after initiation of bipap.  -------------------------  # NSTEMI No known cad. Significant physiologic stress from rib fractures. No chest pain or ekg changes. Troponin 1400 yesterday. Received aspirin - cardiology following - continue heparin (started 13:30 8/18), likely plan for 48 hours - hold home metop given hypotension  # HCAP # Sepsis Worsening dyspnea, cough, has leukocytosis, tachypnea. No PE on CT but does have LLL consolidation. Recent hospitalization - will treat as hcap and so stop ceftriaxone, start cefepime  - f/u blood cultures - sputum for culture if able to produce - check urine antigens  # Rib fractures # Clavicle fracture Multiple rib fractures on left side also left clavicle fracture. Says hospitalized 12 days in [place] at [facility], treated with pain control - plain films pending, will ask ortho to review - pain control, IS  # Hypoxic respiratory failure Dyspneic and o2 87 on arrival, now normalized on 2.5 L. Above processes contributing.  - cont O2 - CXR pending from this morning - will stop fluids, give dose of IV lasix  # Paroxysmal a fib Here rate controlled - cont home amio - IV heparin for home apixaban  # Hypothyroid - cont home levothyroxine  # Hypertension  Here borderline low - hold home meds  # MDD - cont home paroxetine  # T2DM On metformin at home, here glucose appropriate - daily fasting sugars  # OSA - continue home cpap qhs  # RLE laceration Suture repaired 2 weeks ago - rn to remove sutures today  DVT prophylaxis: iv heparin Code Status: full Family Communication: none @  bedside  Level of care: Progressive Status is: Inpatient Remains inpatient appropriate because: severity of illness    Consultants:  cardiology  Procedures: none  Antimicrobials:  Cefepime>ceftriaxone>cefepime    Subjective: This morning ongoing dyspnea that is stable, pain left side  Objective: Vitals:   09/30/21 0421 09/30/21 0518 09/30/21 0750 09/30/21 0805  BP:  129/61  (!) 102/58  Pulse:  86 89 93  Resp:  18 20 (!) 22  Temp:  98 F (36.7 C)  98 F (36.7 C)  TempSrc:    Oral  SpO2: 95% 93% 92% 93%  Weight:      Height:        Intake/Output Summary (Last 24 hours) at 09/30/2021 0914 Last data filed at 09/30/2021 0421 Gross per 24 hour  Intake 1572.36 ml  Output --  Net 1572.36 ml   Filed Weights   09/29/21 1010  Weight: 87.1 kg    Examination:  General exam: mild pain Respiratory system: rales at bases worse on left Cardiovascular system: S1 & S2 heard, rrr Gastrointestinal system: Abdomen is obese, soft and nontender. No organomegaly or masses felt. Normal bowel sounds heard. Central nervous system: Alert and oriented. No focal neurological deficits. Extremities: Symmetric 5 x 5 power. Skin: sutured laceration RLE, bruising on left abdomen/chest Psychiatry: Judgement and insight appear normal. Mood & affect appropriate.     Data Reviewed: I have personally reviewed following labs and imaging studies  CBC: Recent Labs  Lab 09/29/21 1012 09/29/21 1521 09/30/21 0431  WBC 14.9* 10.8* 15.0*  NEUTROABS  --  8.4*  --   HGB 13.0 11.3* 10.9*  HCT 41.4 35.5* 34.6*  MCV 91.4 91.7 93.5  PLT 362 298 818   Basic Metabolic Panel: Recent Labs  Lab 09/29/21 1012 09/29/21 1521 09/30/21 0431  NA 140 139 139  K 3.2* 3.1* 3.3*  CL 104 103 105  CO2 25 26 25   GLUCOSE 170* 135* 181*  BUN 10 8 8   CREATININE 0.65 0.56 0.61  CALCIUM 10.1 9.2 9.0   GFR: Estimated Creatinine Clearance: 80.5 mL/min (by C-G formula based on SCr of 0.61 mg/dL). Liver  Function Tests: Recent Labs  Lab 09/29/21 1012 09/29/21 1521  AST 60* 51*  ALT 60* 51*  ALKPHOS 101 79  BILITOT 1.0 0.9  PROT 7.2 5.8*  ALBUMIN 3.9 3.0*   No results for input(s): "LIPASE", "AMYLASE" in the last 168 hours. No results for input(s): "AMMONIA" in the last 168 hours. Coagulation Profile: Recent Labs  Lab 09/29/21 1521  INR 1.4*   Cardiac Enzymes: No results for input(s): "CKTOTAL", "CKMB", "CKMBINDEX", "TROPONINI" in the last 168 hours. BNP (last 3 results) No results for input(s): "PROBNP" in the last 8760 hours. HbA1C: No results for input(s): "HGBA1C" in the last 72 hours. CBG: Recent Labs  Lab 09/30/21 0823  GLUCAP 159*   Lipid Profile: Recent Labs    09/30/21 0431  CHOL 83  HDL 37*  LDLCALC 32  TRIG 70  CHOLHDL 2.2   Thyroid Function Tests: No results for input(s): "TSH", "T4TOTAL", "FREET4", "T3FREE", "THYROIDAB" in the last 72 hours. Anemia Panel: No results for  input(s): "VITAMINB12", "FOLATE", "FERRITIN", "TIBC", "IRON", "RETICCTPCT" in the last 72 hours. Urine analysis:    Component Value Date/Time   COLORURINE YELLOW 08/17/2021 1049   APPEARANCEUR CLEAR 08/17/2021 1049   APPEARANCEUR Cloudy (A) 01/01/2017 1403   LABSPEC 1.015 08/17/2021 1049   PHURINE 6.0 08/17/2021 1049   GLUCOSEU NEGATIVE 08/17/2021 1049   HGBUR NEGATIVE 08/17/2021 1049   HGBUR trace-lysed 12/20/2009 1107   BILIRUBINUR NEGATIVE 08/17/2021 1049   BILIRUBINUR neg 06/06/2020 1553   BILIRUBINUR Negative 01/01/2017 1403   KETONESUR NEGATIVE 08/17/2021 1049   PROTEINUR Negative 09/07/2019 1442   PROTEINUR 1+ (A) 01/01/2017 1403   PROTEINUR NEGATIVE 07/12/2010 0738   UROBILINOGEN 0.2 08/17/2021 1049   NITRITE NEGATIVE 08/17/2021 1049   LEUKOCYTESUR NEGATIVE 08/17/2021 1049   Sepsis Labs: @LABRCNTIP (procalcitonin:4,lacticidven:4)  ) Recent Results (from the past 240 hour(s))  Culture, blood (x 2)     Status: None (Preliminary result)   Collection Time:  09/29/21  7:00 PM   Specimen: BLOOD  Result Value Ref Range Status   Specimen Description BLOOD LEFT FA  Final   Special Requests   Final    BOTTLES DRAWN AEROBIC AND ANAEROBIC Blood Culture adequate volume   Culture   Final    NO GROWTH < 12 HOURS Performed at [facility], [address] [place], [place] [address]    Report Status PENDING  Incomplete  Culture, blood (x 2)     Status: None (Preliminary result)   Collection Time: 09/29/21  9:05 PM   Specimen: BLOOD  Result Value Ref Range Status   Specimen Description BLOOD BLOOD LEFT FOREARM  Final   Special Requests   Final    BOTTLES DRAWN AEROBIC ONLY Blood Culture adequate volume   Culture   Final    NO GROWTH < 12 HOURS Performed at [facility], [address] [place], [place] [address]    Report Status PENDING  Incomplete         Radiology Studies: CT Angio Chest PE W and/or Wo Contrast  Result Date: 09/29/2021 CLINICAL DATA:  Pulmonary embolism (PE) suspected, high prob. Shortness of breath bilateral leg swelling EXAM: CT ANGIOGRAPHY CHEST WITH CONTRAST TECHNIQUE: Multidetector CT imaging of the chest was performed using the standard protocol during bolus administration of intravenous contrast. Multiplanar CT image reconstructions and MIPs were obtained to evaluate the vascular anatomy. RADIATION DOSE REDUCTION: This exam was performed according to the departmental dose-optimization program which includes automated exposure control, adjustment of the mA and/or kV according to patient size and/or use of iterative reconstruction technique. CONTRAST:  68m OMNIPAQUE IOHEXOL 350 MG/ML SOLN COMPARISON:  CT chest 04/23/2017 FINDINGS: Cardiovascular: Satisfactory opacification of the pulmonary arteries to the segmental level. No evidence of pulmonary embolism. The main pulmonary artery is normal in caliber. Enlarged heart size. No significant pericardial effusion. The thoracic aorta is normal in caliber. Mild  atherosclerotic plaque of the thoracic aorta. At least left anterior descending coronary artery calcifications. Mediastinum/Nodes: No enlarged mediastinal, hilar, or axillary lymph nodes. Thyroid gland, trachea, and esophagus demonstrate no significant findings. Lungs/Pleura: Expiratory phase of respiration. Left lower lobe consolidation may represent combination of passive atelectasis versus infection/inflammation. Right lower lobe atelectasis. No pulmonary nodule. No pulmonary mass. Trace left pleural effusion. No pneumothorax. Upper Abdomen: There is a 3.2 cm fluid dense lesion within the right kidney that likely represents a simple renal cyst. Simple renal cysts, in the absence of clinically indicated signs/symptoms, require no independent follow-up. Musculoskeletal: No chest wall abnormality. No  suspicious lytic or blastic osseous lesions. New from 2021, likely old healed minimally displaced 2-4 rib fractures. Age-indeterminate, likely acute, left displaced anterior 5-7 rib fractures as well as posteriorly displaced 6-7 rib fractures. No acute displaced fracture. Poorly visualized acute, comminuted, intra-articular left distal clavicular fracture. Review of the MIP images confirms the above findings. IMPRESSION: 1. No pulmonary embolus. 2. Trace left pleural effusion with left lower lobe consolidation may represent combination of passive atelectasis versus infection/inflammation. 3. Cardiomegaly. 4. Likely acute, left displaced anterior 5-7 rib fractures as well as posteriorly displaced 6-7 rib fractures (the 6 and 7 ribs are fractured in 2 different places). New from 2021, likely old healed minimally displaced 2-4 rib fractures. Recommend correlation with point tenderness to palpation to evaluate for an acute fracture. No associated pneumothorax. 5. Poorly visualized acute, comminuted, intra-articular left distal clavicular fracture. Recommend dedicated left shoulder and clavicle radiographs. Electronically  Signed   By: Iven Finn M.D.   On: 09/29/2021 15:20   DG Chest 2 View  Result Date: 09/29/2021 CLINICAL DATA:  Shortness of breath EXAM: CHEST - 2 VIEW COMPARISON:  01/10/2021 FINDINGS: Chronic interstitial changes. New patchy density at the left lung base. No pleural effusion. No pneumothorax. Similar cardiomediastinal contours. No acute osseous abnormality. IMPRESSION: Patchy left basilar atelectasis/consolidation. Electronically Signed   By: Macy Mis M.D.   On: 09/29/2021 10:39        Scheduled Meds:  amiodarone  200 mg Oral Daily   aspirin  324 mg Oral NOW   Or   aspirin  300 mg Rectal NOW   aspirin EC  81 mg Oral Daily   atorvastatin  40 mg Oral Daily   furosemide  40 mg Intravenous Once   hydrochlorothiazide  25 mg Oral Daily   levothyroxine  175 mcg Oral Daily   metoprolol succinate  50 mg Oral Daily   PARoxetine  25 mg Oral Daily   potassium chloride  60 mEq Oral Once   Continuous Infusions:  azithromycin     cefTRIAXone (ROCEPHIN)  IV Stopped (09/29/21 2247)   heparin 1,350 Units/hr (09/30/21 0421)     LOS: 1 day     Desma Maxim, MD [facility]   If 7PM-7AM, please contact night-coverage [email or web address] Password [facility] 09/30/2021, 9:14 AM

## 2021-09-30 NOTE — Progress Notes (Signed)
Pt complained of shortness of breath. Sharion Settler, NP informed. Patient requests and receives Xanax and Trazodone at this time. Lung sounds diminished. Vital signs below. NP did not order Lasix based on BP.   Patient was on KCl. Infusion was temporarily stopped at 2000 when pt initially complained of SOB.   09/30/21 2200  Vitals  Temp 98.8 F (37.1 C)  Temp Source Oral  BP (!) 95/47  MAP (mmHg) (!) 64  BP Location Right Arm  BP Method Automatic  Patient Position (if appropriate) Sitting  Pulse Rate 81  Pulse Rate Source Monitor  ECG Heart Rate 80  Resp 19  MEWS COLOR  MEWS Score Color Green  Oxygen Therapy  SpO2 100 %  O2 Device Nasal Cannula  O2 Flow Rate (L/min) 3 L/min  MEWS Score  MEWS Temp 0  MEWS Systolic 1  MEWS Pulse 0  MEWS RR 0  MEWS LOC 0  MEWS Score 1

## 2021-09-30 NOTE — Progress Notes (Signed)
Pt complained oif pain. Morphine 56m administered

## 2021-09-30 NOTE — Progress Notes (Signed)
[place] for initiation & monitoring of heparin infusion Indication: chest pain/ACS  Allergies  Allergen Reactions   Doxycycline Rash   Factive [Gemifloxacin Mesylate] Rash   Crestor [Rosuvastatin]     GI upset   Sulfonamide Derivatives     REACTION: rash   Gemifloxacin Rash   Penicillins Hives and Rash    Has patient had a PCN reaction causing immediate rash, facial/tongue/throat swelling, SOB or lightheadedness with hypotension: No Has patient had a PCN reaction causing severe rash involving mucus membranes or skin necrosis: No Has patient had a PCN reaction that required hospitalization No Has patient had a PCN reaction occurring within the last 10 years: No If all of the above answers are "NO", then may proceed with Cephalosporin use.     Patient Measurements: Height: 5' 7"  (170.2 cm) Weight: 87.1 kg (192 lb 0.3 oz) IBW/kg (Calculated) : 61.6 Heparin Dosing Weight: 80 kg  Vital Signs: Temp: 98 F (36.7 C) (08/19 0518) Temp Source: Axillary (08/18 2342) BP: 129/61 (08/19 0518) Pulse Rate: 86 (08/19 0518)  Labs: Recent Labs    09/29/21 1012 09/29/21 1040 09/29/21 1236 09/29/21 1521 09/29/21 2105 09/30/21 0431  HGB 13.0  --   --  11.3*  --  10.9*  HCT 41.4  --   --  35.5*  --  34.6*  PLT 362  --   --  298  --  294  APTT  --    < >  --  118* 52* 72*  LABPROT  --   --   --  17.5*  --   --   INR  --   --   --  1.4*  --   --   HEPARINUNFRC  --   --   --  >1.10*  --  0.99*  CREATININE 0.65  --   --  0.56  --  0.61  TROPONINIHS 397*  --  1,399* 1,665*  --   --    < > = values in this interval not displayed.     Estimated Creatinine Clearance: 80.5 mL/min (by C-G formula based on SCr of 0.61 mg/dL).   Medical History: Past Medical History:  Diagnosis Date   A-fib ([place])    Anxiety disorder    Asthma    Dyspnea    Gastroschisis    umphalocele, rotated organs abd repair until age 84   GERD (gastroesophageal reflux  disease)    HTN (hypertension)    Hyperlipidemia    IBS (irritable bowel syndrome)    Morbid obesity (HCC)    Target wt - 185  for BMI < 30   Obesity    OSA on CPAP    SBO (small bowel obstruction) ([place])    Resolved with NG/Bowel rest around 2009   Sleep apnea    Type II or unspecified type diabetes mellitus without mention of complication, not stated as uncontrolled     Assessment:  66 y.o. female w/ PMH of  atrial fibrillation, asthma, GERD, hypertension, dyslipidemia, IBS, OSA on CPAP, type 2 diabetes mellitus and dyslipidemia,obesity having increasing shortness of breath and swelling in both her legs with presumed NSTEMI. She is noted to take apixaban with last dose this morning 09/29/21.  Goal of Therapy:  Heparin level 0.3-0.7 units/ml aPTT 66 - 102  seconds Monitor platelets by anticoagulation protocol: Yes   Plan:  8/19 @ 0431: aPTT = 72 (therapeutic X 1),  HL = 0.99  - HL still elevated due  to Eliquis PTA aPTT level will be used to guide therapy until aPTT and heparin levels correlate - Will continue pt on current rate and recheck aPTT in 6 hrs on 8/19 @ 1100. - Will recheck HL on 8/20 with AM Labs.  Continue to monitor H&H and platelets  Kahron Kauth D 09/30/2021,6:14 AM

## 2021-09-30 NOTE — Progress Notes (Signed)
   09/30/21 1108  Assess: MEWS Score  BP (!) 165/72  MAP (mmHg) 92  ECG Heart Rate (!) 106  Resp 19  SpO2 96 %  Assess: MEWS Score  MEWS Temp 0  MEWS Systolic 0  MEWS Pulse 1  MEWS RR 0  MEWS LOC 0  MEWS Score 1  MEWS Score Color Green  Assess: if the MEWS score is Yellow or Red  Were vital signs taken at a resting state? Yes  Focused Assessment Change from prior assessment (see assessment flowsheet)  Does the patient meet 2 or more of the SIRS criteria? No  MEWS guidelines implemented *See Row Information* Yes  Treat  MEWS Interventions Escalated (See documentation below);Administered scheduled meds/treatments;Administered prn meds/treatments;Consulted Respiratory Therapy;Other (Comment) (called a rapid, paged MD)  Pain Scale 0-10  Pain Score 8  Take Vital Signs  Increase Vital Sign Frequency  Yellow: Q 2hr X 2 then Q 4hr X 2, if remains yellow, continue Q 4hrs  Escalate  MEWS: Escalate Yellow: discuss with charge nurse/RN and consider discussing with provider and RRT  Notify: Charge Nurse/RN  Name of Charge Nurse/RN Notified [place]  Date Charge Nurse/RN Notified 09/30/21  Time Charge Nurse/RN Notified 51  Notify: Provider  Provider Name/Title [place]  Date Provider Notified 09/30/21  Time Provider Notified 1108  Method of Notification Page  Notification Reason Change in status  Provider response En route  Date of Provider Response 09/30/21  Time of Provider Response 1111  Notify: Rapid Response  Name of Rapid Response RN Notified Katie  Date Rapid Response Notified 09/30/21  Time Rapid Response Notified 0272  Document  Patient Outcome Not stable and remains on department  Assess: SIRS CRITERIA  SIRS Temperature  0  SIRS Pulse 1  SIRS Respirations  0  SIRS WBC 1  SIRS Score Sum  2   Rapid was called, lasix given, bipap placed, MD came to beside

## 2021-09-30 NOTE — Progress Notes (Signed)
RT called to room for pt desat into 80's. Pt seen with increased WOB and accessory muscle use. Pt was asked if she wanted her home CPAP machine, she declined. Pt placed on [place] (cool mist) HFNC of 11L. Pt eventually needed BiPAP due to increased WOB and grunting. Pt placed onBiPAP 15/5 RR 16 30%. Pt placed on 100% during urine cath placement.

## 2021-09-30 NOTE — Progress Notes (Signed)
[facility] for initiation & monitoring of heparin infusion Indication: chest pain/ACS  Allergies  Allergen Reactions   Doxycycline Rash   Factive [Gemifloxacin Mesylate] Rash   Crestor [Rosuvastatin]     GI upset   Sulfonamide Derivatives     REACTION: rash   Gemifloxacin Rash   Penicillins Hives and Rash    Has patient had a PCN reaction causing immediate rash, facial/tongue/throat swelling, SOB or lightheadedness with hypotension: No Has patient had a PCN reaction causing severe rash involving mucus membranes or skin necrosis: No Has patient had a PCN reaction that required hospitalization No Has patient had a PCN reaction occurring within the last 10 years: No If all of the above answers are "NO", then may proceed with Cephalosporin use.     Patient Measurements: Height: 5' 7"  (170.2 cm) Weight: 87.1 kg (192 lb 0.3 oz) IBW/kg (Calculated) : 61.6 Heparin Dosing Weight: 80 kg  Vital Signs: Temp: 98 F (36.7 C) (08/19 0805) Temp Source: Oral (08/19 0805) BP: 165/72 (08/19 1108) Pulse Rate: 94 (08/19 1133)  Labs: Recent Labs    09/29/21 1012 09/29/21 1040 09/29/21 1236 09/29/21 1521 09/29/21 2105 09/30/21 0431 09/30/21 0915 09/30/21 1100  HGB 13.0  --   --  11.3*  --  10.9*  --   --   HCT 41.4  --   --  35.5*  --  34.6*  --   --   PLT 362  --   --  298  --  294  --   --   APTT  --    < >  --  118* 52* 72*  --  90*  LABPROT  --   --   --  17.5*  --   --   --   --   INR  --   --   --  1.4*  --   --   --   --   HEPARINUNFRC  --   --   --  >1.10*  --  0.99*  --   --   CREATININE 0.65  --   --  0.56  --  0.61  --   --   TROPONINIHS 397*  --  1,399* 1,665*  --   --  573*  --    < > = values in this interval not displayed.     Estimated Creatinine Clearance: 80.5 mL/min (by C-G formula based on SCr of 0.61 mg/dL).   Medical History: Past Medical History:  Diagnosis Date   A-fib ([place])    Anxiety disorder    Asthma     Dyspnea    Gastroschisis    umphalocele, rotated organs abd repair until age 32   GERD (gastroesophageal reflux disease)    HTN (hypertension)    Hyperlipidemia    IBS (irritable bowel syndrome)    Morbid obesity ([facility])    Target wt - 185  for BMI < 30   Obesity    OSA on CPAP    SBO (small bowel obstruction) ([place])    Resolved with NG/Bowel rest around 2009   Sleep apnea    Type II or unspecified type diabetes mellitus without mention of complication, not stated as uncontrolled     Assessment:  66 y.o. female w/ PMH of  atrial fibrillation, asthma, GERD, hypertension, dyslipidemia, IBS, OSA on CPAP, type 2 diabetes mellitus and dyslipidemia,obesity having increasing shortness of breath and swelling in both her legs with presumed NSTEMI. She is  noted to take apixaban with last dose this morning 09/29/21.  Goal of Therapy:  Heparin level 0.3-0.7 units/ml aPTT 66 - 102  seconds Monitor platelets by anticoagulation protocol: Yes  8/19 @ 0431: aPTT = 72 (therapeutic X 1),  HL = 0.99    Plan:  8/19 @ 1100: aPTT = 90 sec (therapeutic x 2) - HL was still elevated due to Eliquis PTA aPTT level will be used to guide therapy until aPTT and heparin levels correlate - Will continue pt on current rate and recheck aPTT and HL with AM labs. Continue to monitor H&H and platelets  Shagun Wordell A Willella Harding 09/30/2021,11:54 AM

## 2021-09-30 NOTE — Progress Notes (Signed)
66 y o f FC. Alert and oriented by 4. Complaints of Shortness of breath and bilateral edema noted in legs. CXR showed left sided atelectasis. Diagnosed with NSTEMI. Pt had a fall prior to this admission. Scattered brusing all over body and abrasion on right lower extremity. History of A-fib, DM 2 and CHF. Patient is currently on Rocephin. VTE is Eliquis. Heparin dose change at 2145 for 1350 units with a bolus of 2000 units. Respiratory therapist present to administer breathing treatment for wheezing. Patient has left shoulder pain and left leg and left side of the body has limited movement.

## 2021-09-30 NOTE — Progress Notes (Signed)
*  PRELIMINARY RESULTS* Echocardiogram 2D Echocardiogram has been performed. Definity IV ultrasound imaging agent used on this study.  Claretta Fraise 09/30/2021, 12:37 PM

## 2021-10-01 DIAGNOSIS — I214 Non-ST elevation (NSTEMI) myocardial infarction: Secondary | ICD-10-CM | POA: Diagnosis not present

## 2021-10-01 LAB — CBC
HCT: 30.7 % — ABNORMAL LOW (ref 36.0–46.0)
Hemoglobin: 9.7 g/dL — ABNORMAL LOW (ref 12.0–15.0)
MCH: 29.5 pg (ref 26.0–34.0)
MCHC: 31.6 g/dL (ref 30.0–36.0)
MCV: 93.3 fL (ref 80.0–100.0)
Platelets: 215 10*3/uL (ref 150–400)
RBC: 3.29 MIL/uL — ABNORMAL LOW (ref 3.87–5.11)
RDW: 14.8 % (ref 11.5–15.5)
WBC: 15.8 10*3/uL — ABNORMAL HIGH (ref 4.0–10.5)
nRBC: 0 % (ref 0.0–0.2)

## 2021-10-01 LAB — APTT
aPTT: 162 seconds (ref 24–36)
aPTT: 85 seconds — ABNORMAL HIGH (ref 24–36)
aPTT: 69 seconds — ABNORMAL HIGH (ref 24–36)

## 2021-10-01 LAB — BASIC METABOLIC PANEL
Anion gap: 8 (ref 5–15)
BUN: 14 mg/dL (ref 8–23)
CO2: 28 mmol/L (ref 22–32)
Calcium: 9 mg/dL (ref 8.9–10.3)
Chloride: 101 mmol/L (ref 98–111)
Creatinine, Ser: 0.87 mg/dL (ref 0.44–1.00)
GFR, Estimated: >60 mL/min (ref >60)
Glucose, Bld: 119 mg/dL — ABNORMAL HIGH (ref 70–99)
Potassium: 3.6 mmol/L (ref 3.5–5.1)
Sodium: 137 mmol/L (ref 135–145)

## 2021-10-01 LAB — TROPONIN I (HIGH SENSITIVITY): Troponin I (High Sensitivity): 395 ng/L (ref <18)

## 2021-10-01 LAB — LACTIC ACID, PLASMA: Lactic Acid, Venous: 1 mmol/L (ref 0.5–1.9)

## 2021-10-01 LAB — SARS CORONAVIRUS 2 BY RT PCR: SARS Coronavirus 2 by RT PCR: NEGATIVE

## 2021-10-01 LAB — HEPARIN LEVEL (UNFRACTIONATED): Heparin Unfractionated: 0.82 IU/mL — ABNORMAL HIGH (ref 0.30–0.70)

## 2021-10-01 LAB — GROUP A STREP BY PCR: Group A Strep by PCR: NOT DETECTED

## 2021-10-01 MED ORDER — HEPARIN (PORCINE) 25000 UT/250ML-% IV SOLN
1400.0000 [IU]/h | INTRAVENOUS | Status: DC
  Administered 2021-10-01: 1100 [IU]/h via INTRAVENOUS
  Administered 2021-10-02: 1400 [IU]/h via INTRAVENOUS
  Filled 2021-10-01: qty 250

## 2021-10-01 MED ORDER — FUROSEMIDE 10 MG/ML IJ SOLN
60.0000 mg | Freq: Once | INTRAMUSCULAR | Status: AC
  Administered 2021-10-01: 60 mg via INTRAVENOUS
  Filled 2021-10-01: qty 6

## 2021-10-01 MED ORDER — FUROSEMIDE 10 MG/ML IJ SOLN: 80.0000 mg | Freq: Once | INTRAMUSCULAR | Status: DC

## 2021-10-01 NOTE — Progress Notes (Signed)
Heparin restarted at 7:20 am at 1100 units

## 2021-10-01 NOTE — Progress Notes (Addendum)
[name], Lab called: aPTT of 162.  Kirsten [name], Pharmacy has advised to hold drip for 1 hour (from 6am to 7am) and readjust administration to start at new rate of 1100 units at 7am

## 2021-10-01 NOTE — Progress Notes (Signed)
PROGRESS NOTE    Kirsten [name]  QBV:[id number] DOB: October 25, 1955 DOA: 09/29/2021 PCP: [name], MD     Brief Narrative:   From admission h and p Kirsten [name] is a 66 y.o. Caucasian female with medical history significant for atrial fibrillation on Eliquis, asthma, GERD, hypertension, dyslipidemia, IBS, OSA on CPAP, type 2 diabetes mellitus and dyslipidemia and obesity, who presented to the ER  who was in [place] and fell with subsequent left-sided 8 ribs fracture as well as left clavicular fracture for which she was hospitalized for several days and had apparently had hypercarbic respiratory failure that was managed with improvement she was discharged yesterday and drove home.  This morning she was having increasing dyspnea and worsening lower extremity edema with no previous history of CHF.  She admits to left upper back pain that is not different from her pain with ribs and clavicular fracture.  She has been having mild cough with inability to expectorate though she was trying to clear her throat.  She admits to occasional wheezing and has been fatigued and tired.  She felt hot and chilly but did not have any reported fever.  No nausea or vomiting or abdominal pain or diarrhea or melena or bright red bleeding per rectum.  No other bleeding diathesis.  She denies any fever or chills.  She has a healing clean right leg sutured wound.   Assessment & Plan:   Principal Problem:   NSTEMI (non-ST elevated myocardial infarction) ([place]) Active Problems:   Hypothyroidism   Essential hypertension, benign   OSA on CPAP   Paroxysmal atrial fibrillation (HCC)   Class 1 obesity with serious comorbidity and body mass index (BMI) of 34.0 to 34.9 in adult   Sepsis due to pneumonia ([place])   Dyslipidemia   Type 2 diabetes mellitus without complications ([place])   Anxiety and depression   Multiple rib fractures  # NSTEMI No known cad. Significant physiologic stress from rib fractures. No chest  pain or ekg changes. Troponin peak 1400, downtrending since. Received aspirin - cardiology following - continue heparin (started 13:30 8/18) - hold home metop given hypotension - plan is for cath tomorrow  # Acute hypoxic respiratory failure Secondary to rib fractures, nstemi, hcap, and volume overload. Rapid response 8/19 for volume overload, treated with bipap and lasix, promptly improved with that. Still requiring O2 though no respiratory distress, with diffuse rales today - additional 40 of lasix ordered - d/c Foley  # HCAP # Sepsis Presenting with worsening dyspnea, cough, has leukocytosis, tachypnea. No PE on CT but does have LLL consolidation. Recent hospitalization. Covid negative - continue cefepime - f/u blood cultures, ngtd - sputum for culture if able to produce - f/u urine antigens - incentive spirometry  # Loose stool Overnight x1. Likely 2/2 abx. No abd pain or fever - stool studies if has diarrhea  # Rib fractures # Clavicle fracture Multiple rib fractures on left side also left clavicle fracture. Says hospitalized 12 days in [place] at [facility], treated with pain control - ortho has seen, nothing to do here, f/u with them 2 wks  # Paroxysmal a fib Here rate controlled - cont home amio - IV heparin for home apixaban  # Hypothyroid - cont home levothyroxine  # Hypertension Here low bp - hold home meds  # MDD - cont home paroxetine  # T2DM On metformin at home, here glucose appropriate - daily fasting sugars  # OSA - continue home cpap  qhs  # RLE laceration Suture repaired 2 weeks ago, sutures removed 8/19. Some erythema likely reactive. Photo taken - monitor for s/s infection  DVT prophylaxis: iv heparin Code Status: full Family Communication: husband updated @ bedside 8/20  Level of care: Progressive Status is: Inpatient Remains inpatient appropriate because: severity of illness    Consultants:   cardiology  Procedures: none  Antimicrobials:  Cefepime>ceftriaxone>cefepime    Subjective: No dyspnea, left sided rib pain stable, subjective but no documented fever, loose stool overnight  Objective: Vitals:   10/01/21 0404 10/01/21 0754 10/01/21 0819 10/01/21 1000  BP: (!) 110/53 (!) 92/41  (!) 96/48  Pulse: 80 74 73   Resp: 20  (!) 22 19  Temp: 97.6 F (36.4 C) 99.1 F (37.3 C)  98.1 F (36.7 C)  TempSrc: Oral   Oral  SpO2: 94%  94% 94%  Weight:      Height:        Intake/Output Summary (Last 24 hours) at 10/01/2021 1038 Last data filed at 10/01/2021 0500 Gross per 24 hour  Intake 605.86 ml  Output 650 ml  Net -44.14 ml   Filed Weights   09/29/21 1010  Weight: 87.1 kg    Examination:  General exam: mild pain Respiratory system: mid to lower lungs b/l worse on left Cardiovascular system: S1 & S2 heard, rrr Gastrointestinal system: Abdomen is obese, soft and nontender. No organomegaly or masses felt. Normal bowel sounds heard. Central nervous system: Alert and oriented. No focal neurological deficits. Extremities: Symmetric 5 x 5 power. Skin: some erythema surrounding RLE laceration, bruising on left abdomen/chest Psychiatry: Judgement and insight appear normal. Mood & affect appropriate.     Data Reviewed: I have personally reviewed following labs and imaging studies  CBC: Recent Labs  Lab 09/29/21 1012 09/29/21 1521 09/30/21 0431 10/01/21 0826  WBC 14.9* 10.8* 15.0* 15.8*  NEUTROABS  --  8.4*  --   --   HGB 13.0 11.3* 10.9* 9.7*  HCT 41.4 35.5* 34.6* 30.7*  MCV 91.4 91.7 93.5 93.3  PLT 362 298 294 242   Basic Metabolic Panel: Recent Labs  Lab 09/29/21 1012 09/29/21 1521 09/30/21 0431 09/30/21 1100 09/30/21 1934 10/01/21 0529  NA 140 139 139 138  --  137  K 3.2* 3.1* 3.3* 3.4* 4.1 3.6  CL 104 103 105 100  --  101  CO2 25 26 25 23   --  28  GLUCOSE 170* 135* 181* 162*  --  119*  BUN 10 8 8 8   --  14  CREATININE 0.65 0.56 0.61 0.80   --  0.87  CALCIUM 10.1 9.2 9.0 9.4  --  9.0  MG  --   --   --  1.8  --   --    GFR: Estimated Creatinine Clearance: 74 mL/min (by C-G formula based on SCr of 0.87 mg/dL). Liver Function Tests: Recent Labs  Lab 09/29/21 1012 09/29/21 1521  AST 60* 51*  ALT 60* 51*  ALKPHOS 101 79  BILITOT 1.0 0.9  PROT 7.2 5.8*  ALBUMIN 3.9 3.0*   No results for input(s): "LIPASE", "AMYLASE" in the last 168 hours. No results for input(s): "AMMONIA" in the last 168 hours. Coagulation Profile: Recent Labs  Lab 09/29/21 1521  INR 1.4*   Cardiac Enzymes: No results for input(s): "CKTOTAL", "CKMB", "CKMBINDEX", "TROPONINI" in the last 168 hours. BNP (last 3 results) No results for input(s): "PROBNP" in the last 8760 hours. HbA1C: No results for input(s): "HGBA1C" in the last  72 hours. CBG: Recent Labs  Lab 09/30/21 0823 09/30/21 1100  GLUCAP 159* 140*   Lipid Profile: Recent Labs    09/30/21 0431  CHOL 83  HDL 37*  LDLCALC 32  TRIG 70  CHOLHDL 2.2   Thyroid Function Tests: No results for input(s): "TSH", "T4TOTAL", "FREET4", "T3FREE", "THYROIDAB" in the last 72 hours. Anemia Panel: No results for input(s): "VITAMINB12", "FOLATE", "FERRITIN", "TIBC", "IRON", "RETICCTPCT" in the last 72 hours. Urine analysis:    Component Value Date/Time   COLORURINE YELLOW 08/17/2021 1049   APPEARANCEUR CLEAR 08/17/2021 1049   APPEARANCEUR Cloudy (A) 01/01/2017 1403   LABSPEC 1.015 08/17/2021 1049   PHURINE 6.0 08/17/2021 1049   GLUCOSEU NEGATIVE 08/17/2021 1049   HGBUR NEGATIVE 08/17/2021 1049   HGBUR trace-lysed 12/20/2009 1107   BILIRUBINUR NEGATIVE 08/17/2021 1049   BILIRUBINUR neg 06/06/2020 1553   BILIRUBINUR Negative 01/01/2017 1403   KETONESUR NEGATIVE 08/17/2021 1049   PROTEINUR Negative 09/07/2019 1442   PROTEINUR 1+ (A) 01/01/2017 1403   PROTEINUR NEGATIVE 07/12/2010 0738   UROBILINOGEN 0.2 08/17/2021 1049   NITRITE NEGATIVE 08/17/2021 1049   LEUKOCYTESUR NEGATIVE  08/17/2021 1049   Sepsis Labs: @LABRCNTIP (procalcitonin:4,lacticidven:4)  ) Recent Results (from the past 240 hour(s))  Culture, blood (x 2)     Status: None (Preliminary result)   Collection Time: 09/29/21  7:00 PM   Specimen: BLOOD  Result Value Ref Range Status   Specimen Description BLOOD LEFT FA  Final   Special Requests   Final    BOTTLES DRAWN AEROBIC AND ANAEROBIC Blood Culture adequate volume   Culture   Final    NO GROWTH 2 DAYS Performed at [facility], [address] [place], [place] [address]    Report Status PENDING  Incomplete  Culture, blood (x 2)     Status: None (Preliminary result)   Collection Time: 09/29/21  9:05 PM   Specimen: BLOOD  Result Value Ref Range Status   Specimen Description BLOOD BLOOD LEFT FOREARM  Final   Special Requests   Final    BOTTLES DRAWN AEROBIC ONLY Blood Culture adequate volume   Culture   Final    NO GROWTH 2 DAYS Performed at [facility], [address], [place], [place] [address]    Report Status PENDING  Incomplete  Group A Strep by PCR     Status: None   Collection Time: 10/01/21  7:59 AM   Specimen: Throat; Sterile Swab  Result Value Ref Range Status   Group A Strep by PCR NOT DETECTED NOT DETECTED Final    Comment: Performed at [facility], [address] [place], [place] [address]  SARS Coronavirus 2 by RT PCR (hospital order, performed in [facility] hospital lab) *cepheid single result test* Throat     Status: None   Collection Time: 10/01/21  9:17 AM   Specimen: Throat; Nasal Swab  Result Value Ref Range Status   SARS Coronavirus 2 by RT PCR NEGATIVE NEGATIVE Final    Comment: (NOTE) SARS-CoV-2 target nucleic acids are NOT DETECTED.  The SARS-CoV-2 RNA is generally detectable in upper and lower respiratory specimens during the acute phase of infection. The lowest concentration of SARS-CoV-2 viral copies this assay can detect is 250 copies / mL. A negative result does not  preclude SARS-CoV-2 infection and should not be used as the sole basis for treatment or other patient management decisions.  A negative result may occur with improper specimen collection / handling, submission of specimen other than  nasopharyngeal swab, presence of viral mutation(s) within the areas targeted by this assay, and inadequate number of viral copies (<250 copies / mL). A negative result must be combined with clinical observations, patient history, and epidemiological information.  Fact Sheet for Patients:   https://www.patel.info/  Fact Sheet for Healthcare Providers: https://hall.com/  This test is not yet approved or  cleared by the [place] FDA and has been authorized for detection and/or diagnosis of SARS-CoV-2 by FDA under an Emergency Use Authorization (EUA).  This EUA will remain in effect (meaning this test can be used) for the duration of the COVID-19 declaration under Section 564(b)(1) of the Act, 21 U.S.C. section 360bbb-3(b)(1), unless the authorization is terminated or revoked sooner.  Performed at [facility], [address], [place], [place] [address]          Radiology Studies: ECHOCARDIOGRAM COMPLETE  Result Date: 09/30/2021    ECHOCARDIOGRAM REPORT   Patient Name:   DONALDA JOB Date of Exam: 09/30/2021 Medical Rec #:  [id number]        Height:       67.0 in Accession #:    [id number]       Weight:       192.0 lb Date of Birth:  05-Sep-1955        BSA:          1.988 m Patient Age:    47 years         BP:           102/58 mmHg Patient Gender: F                HR:           90 bpm. Exam Location:  [facility] Procedure: 2D Echo and Intracardiac Opacification Agent Indications:     NSTEMI I21.4  History:         Patient has prior history of Echocardiogram examinations, most                  recent 12/12/2020.  Sonographer:     Kathlen Brunswick RDCS Referring Phys:  [id number] [place] TANG Diagnosing  Phys: Isaias Cowman MD  Sonographer Comments: Technically challenging study due to limited acoustic windows, no parasternal window, no apical window and no subcostal window. The patient was on Bipap at the time of this study. There were no acoustic windows obtainable on this study. Definity IV ultrasound imaging agent attempted to enhance endocardial definition. IMPRESSIONS  1. Left ventricular ejection fraction, by estimation, is 55 to 60%. The left ventricle has normal function. The left ventricle has no regional wall motion abnormalities. Left ventricular diastolic parameters are indeterminate.  2. Right ventricular systolic function is normal. The right ventricular size is normal.  3. The mitral valve is normal in structure. No evidence of mitral valve regurgitation. No evidence of mitral stenosis.  4. The aortic valve is normal in structure. Aortic valve regurgitation is not visualized. No aortic stenosis is present.  5. The inferior vena cava is normal in size with greater than 50% respiratory variability, suggesting right atrial pressure of 3 mmHg. FINDINGS  Left Ventricle: Left ventricular ejection fraction, by estimation, is 55 to 60%. The left ventricle has normal function. The left ventricle has no regional wall motion abnormalities. Definity contrast agent was given IV to delineate the left ventricular  endocardial borders. The left ventricular internal cavity size was normal in size. There is no left ventricular hypertrophy. Left ventricular diastolic parameters are indeterminate.  Right Ventricle: The right ventricular size is normal. No increase in right ventricular wall thickness. Right ventricular systolic function is normal. Left Atrium: Left atrial size was normal in size. Right Atrium: Right atrial size was normal in size. Pericardium: There is no evidence of pericardial effusion. Mitral Valve: The mitral valve is normal in structure. No evidence of mitral valve regurgitation. No evidence of  mitral valve stenosis. Tricuspid Valve: The tricuspid valve is normal in structure. Tricuspid valve regurgitation is not demonstrated. No evidence of tricuspid stenosis. Aortic Valve: The aortic valve is normal in structure. Aortic valve regurgitation is not visualized. No aortic stenosis is present. Pulmonic Valve: The pulmonic valve was normal in structure. Pulmonic valve regurgitation is not visualized. No evidence of pulmonic stenosis. Aorta: The aortic root is normal in size and structure. Venous: The inferior vena cava is normal in size with greater than 50% respiratory variability, suggesting right atrial pressure of 3 mmHg. IAS/Shunts: No atrial level shunt detected by color flow Doppler. Isaias Cowman MD Electronically signed by Isaias Cowman MD Signature Date/Time: 09/30/2021/1:23:57 PM    Final    DG Shoulder Left  Result Date: 09/30/2021 CLINICAL DATA:  Left shoulder pain. EXAM: LEFT SHOULDER - 2+ VIEW COMPARISON:  None Available. FINDINGS: Distal left clavicle fracture as detailed under the left clavicle radiographs. Fracture of the posterolateral left fourth rib, chronicity unclear. Fracture of the posterior second rim that appears recent. No other fractures. Glenohumeral and AC joints are normally aligned. IMPRESSION: 1. Distal left clavicle fracture. 2. Fractures of the posterior left second and posterolateral left fourth ribs, likely both acute/recent. 3. No other fractures.  No dislocation. Electronically Signed   By: Lajean Manes M.D.   On: 09/30/2021 10:43   DG Clavicle Left  Result Date: 09/30/2021 CLINICAL DATA:  Left shoulder pain. EXAM: LEFT CLAVICLE - 2+ VIEWS COMPARISON:  None Available. FINDINGS: Fracture of the distal left clavicle. There is an oblique fracture line extending from the inferior aspect of the distal clavicle, at the level of the coracoclavicular ligament, distally and superiorly. A transverse fracture component extends across the distal aspect of the  clavicle, 9 mm central to the [facility] joint. Distal fracture component is mildly displaced superiorly, by 3-4 mm. No other fractures.  No bone lesions.  AC joint is normally aligned. IMPRESSION: 1. Distal left clavicle fracture as detailed.  No dislocation. Electronically Signed   By: Lajean Manes M.D.   On: 09/30/2021 10:41   DG Chest Port 1 View  Result Date: 09/30/2021 CLINICAL DATA:  Dyspnea left shoulder pain. EXAM: PORTABLE CHEST 1 VIEW COMPARISON:  09/29/2021 FINDINGS: 0837 hours. The cardio pericardial silhouette is enlarged. Vascular congestion with diffuse interstitial opacity suggests edema. Minimal bibasilar atelectasis/infiltrate noted. Bones are diffusely demineralized. Telemetry leads overlie the chest. IMPRESSION: 1. Cardiomegaly with vascular congestion and diffuse interstitial opacity suggesting edema. 2. Minimal bibasilar atelectasis/infiltrate. Electronically Signed   By: Misty Stanley M.D.   On: 09/30/2021 09:21   CT Angio Chest PE W and/or Wo Contrast  Result Date: 09/29/2021 CLINICAL DATA:  Pulmonary embolism (PE) suspected, high prob. Shortness of breath bilateral leg swelling EXAM: CT ANGIOGRAPHY CHEST WITH CONTRAST TECHNIQUE: Multidetector CT imaging of the chest was performed using the standard protocol during bolus administration of intravenous contrast. Multiplanar CT image reconstructions and MIPs were obtained to evaluate the vascular anatomy. RADIATION DOSE REDUCTION: This exam was performed according to the departmental dose-optimization program which includes automated exposure control, adjustment of the mA and/or kV according to  patient size and/or use of iterative reconstruction technique. CONTRAST:  41m OMNIPAQUE IOHEXOL 350 MG/ML SOLN COMPARISON:  CT chest 04/23/2017 FINDINGS: Cardiovascular: Satisfactory opacification of the pulmonary arteries to the segmental level. No evidence of pulmonary embolism. The main pulmonary artery is normal in caliber. Enlarged heart size. No  significant pericardial effusion. The thoracic aorta is normal in caliber. Mild atherosclerotic plaque of the thoracic aorta. At least left anterior descending coronary artery calcifications. Mediastinum/Nodes: No enlarged mediastinal, hilar, or axillary lymph nodes. Thyroid gland, trachea, and esophagus demonstrate no significant findings. Lungs/Pleura: Expiratory phase of respiration. Left lower lobe consolidation may represent combination of passive atelectasis versus infection/inflammation. Right lower lobe atelectasis. No pulmonary nodule. No pulmonary mass. Trace left pleural effusion. No pneumothorax. Upper Abdomen: There is a 3.2 cm fluid dense lesion within the right kidney that likely represents a simple renal cyst. Simple renal cysts, in the absence of clinically indicated signs/symptoms, require no independent follow-up. Musculoskeletal: No chest wall abnormality. No suspicious lytic or blastic osseous lesions. New from 2021, likely old healed minimally displaced 2-4 rib fractures. Age-indeterminate, likely acute, left displaced anterior 5-7 rib fractures as well as posteriorly displaced 6-7 rib fractures. No acute displaced fracture. Poorly visualized acute, comminuted, intra-articular left distal clavicular fracture. Review of the MIP images confirms the above findings. IMPRESSION: 1. No pulmonary embolus. 2. Trace left pleural effusion with left lower lobe consolidation may represent combination of passive atelectasis versus infection/inflammation. 3. Cardiomegaly. 4. Likely acute, left displaced anterior 5-7 rib fractures as well as posteriorly displaced 6-7 rib fractures (the 6 and 7 ribs are fractured in 2 different places). New from 2021, likely old healed minimally displaced 2-4 rib fractures. Recommend correlation with point tenderness to palpation to evaluate for an acute fracture. No associated pneumothorax. 5. Poorly visualized acute, comminuted, intra-articular left distal clavicular  fracture. Recommend dedicated left shoulder and clavicle radiographs. Electronically Signed   By: MIven FinnM.D.   On: 09/29/2021 15:20        Scheduled Meds:  amiodarone  200 mg Oral Daily   aspirin EC  81 mg Oral Daily   atorvastatin  40 mg Oral Daily   furosemide  60 mg Intravenous Once   ipratropium-albuterol  3 mL Nebulization Q4H   levothyroxine  175 mcg Oral Daily   PARoxetine  25 mg Oral Daily   sodium chloride flush  3 mL Intravenous Q12H   Continuous Infusions:  sodium chloride     sodium chloride     ceFEPime (MAXIPIME) IV 2 g (10/01/21 1015)   heparin 1,100 Units/hr (10/01/21 0722)     LOS: 2 days     NDesma Maxim MD [facility]   If 7PM-7AM, please contact night-coverage [email or web address] Password [email or web address]8/20/2023, 10:38 AM

## 2021-10-01 NOTE — Progress Notes (Signed)
[place] for initiation & monitoring of heparin infusion Indication: chest pain/ACS  Allergies  Allergen Reactions   Doxycycline Rash   Factive [Gemifloxacin Mesylate] Rash   Crestor [Rosuvastatin]     GI upset   Sulfonamide Derivatives     REACTION: rash   Gemifloxacin Rash   Penicillins Hives and Rash    Has patient had a PCN reaction causing immediate rash, facial/tongue/throat swelling, SOB or lightheadedness with hypotension: No Has patient had a PCN reaction causing severe rash involving mucus membranes or skin necrosis: No Has patient had a PCN reaction that required hospitalization No Has patient had a PCN reaction occurring within the last 10 years: No If all of the above answers are "NO", then may proceed with Cephalosporin use.     Patient Measurements: Height: 5' 7"  (170.2 cm) Weight: 87.1 kg (192 lb 0.3 oz) IBW/kg (Calculated) : 61.6 Heparin Dosing Weight: 80 kg  Vital Signs: Temp: 98.6 F (37 C) (08/20 1327) Temp Source: Oral (08/20 1327) BP: 89/59 (08/20 1327) Pulse Rate: 87 (08/20 1327)  Labs: Recent Labs    09/29/21 1521 09/29/21 2105 09/30/21 0431 09/30/21 0915 09/30/21 1100 10/01/21 0529 10/01/21 0826 10/01/21 1330  HGB 11.3*  --  10.9*  --   --   --  9.7*  --   HCT 35.5*  --  34.6*  --   --   --  30.7*  --   PLT 298  --  294  --   --   --  215  --   APTT 118*   < > 72*  --  90* 162*  --  85*  LABPROT 17.5*  --   --   --   --   --   --   --   INR 1.4*  --   --   --   --   --   --   --   HEPARINUNFRC >1.10*  --  0.99*  --   --  0.82*  --   --   CREATININE 0.56  --  0.61  --  0.80 0.87  --   --   TROPONINIHS 1,665*  --   --  573*  --  395*  --   --    < > = values in this interval not displayed.     Estimated Creatinine Clearance: 74 mL/min (by C-G formula based on SCr of 0.87 mg/dL).   Medical History: Past Medical History:  Diagnosis Date   A-fib ([place])    Anxiety disorder    Asthma    Dyspnea     Gastroschisis    umphalocele, rotated organs abd repair until age 16   GERD (gastroesophageal reflux disease)    HTN (hypertension)    Hyperlipidemia    IBS (irritable bowel syndrome)    Morbid obesity (HCC)    Target wt - 185  for BMI < 30   Obesity    OSA on CPAP    SBO (small bowel obstruction) ([place])    Resolved with NG/Bowel rest around 2009   Sleep apnea    Type II or unspecified type diabetes mellitus without mention of complication, not stated as uncontrolled     Assessment:  66 y.o. female w/ PMH of  atrial fibrillation, asthma, GERD, hypertension, dyslipidemia, IBS, OSA on CPAP, type 2 diabetes mellitus and dyslipidemia,obesity having increasing shortness of breath and swelling in both her legs with presumed NSTEMI. She is noted to take apixaban  with last dose this morning 09/29/21.  Goal of Therapy:  Heparin level 0.3-0.7 units/ml aPTT 66 - 102  seconds Monitor platelets by anticoagulation protocol: Yes  8/19 @ 0431: aPTT = 72 sec(therapeutic X 1),  HL = 0.99  8/20 @ 0529:  HL = 0.82 sec, aPTT = 162   Plan:  8/20 @ 1330: aPTT = 85 sec, therapeutic x 1  -  Will check confirmatory aPTT in 6 hours. -  Will recheck HL on 8/21 with AM labs.   Continue to monitor H&H and platelets  Barbarajean Kinzler A Meral Geissinger 10/01/2021,2:31 PM

## 2021-10-01 NOTE — Progress Notes (Signed)
This morning at 5 am, blood was noticed flowing down the patient's catheter. Prior to that it had been yellow/clear. It is currently pink/ red tinged. Will inform Sharion Settler, NP

## 2021-10-01 NOTE — Progress Notes (Signed)
Per documentation, pt's foley dc'd @1646 . Pt denies having voided and any urge to void. Unable to palpate bladder and bladder scan indicated only 68ms. Will reassess in approx. 2 hours.

## 2021-10-01 NOTE — Progress Notes (Signed)
Pt had a fever at midninght. Temp was 99.8. Current Temp is 99.7. Will administer Tylenol.

## 2021-10-01 NOTE — Progress Notes (Signed)
Informed by Hassan Rowan, NP to continue to monitor urinary output and maintain patient at 13.5 ml/hr IV Heparin.   Pt had a urinary output of 400 ml. See Flowsheets. She also had loose stools this shift.  Pt is still off KCl infusion. 1930 labs showed K was 4.1. Pablo Ledger., NP was informed. Advised to wait on results from morning labs. Will continue to monitor.

## 2021-10-01 NOTE — Progress Notes (Signed)
[facility] for initiation & monitoring of heparin infusion Indication: chest pain/ACS  Allergies  Allergen Reactions   Doxycycline Rash   Factive [Gemifloxacin Mesylate] Rash   Crestor [Rosuvastatin]     GI upset   Sulfonamide Derivatives     REACTION: rash   Gemifloxacin Rash   Penicillins Hives and Rash    Has patient had a PCN reaction causing immediate rash, facial/tongue/throat swelling, SOB or lightheadedness with hypotension: No Has patient had a PCN reaction causing severe rash involving mucus membranes or skin necrosis: No Has patient had a PCN reaction that required hospitalization No Has patient had a PCN reaction occurring within the last 10 years: No If all of the above answers are "NO", then may proceed with Cephalosporin use.     Patient Measurements: Height: 5' 7"  (170.2 cm) Weight: 87.1 kg (192 lb 0.3 oz) IBW/kg (Calculated) : 61.6 Heparin Dosing Weight: 80 kg  Vital Signs: Temp: 97.6 F (36.4 C) (08/20 0404) Temp Source: Oral (08/20 0404) BP: 110/53 (08/20 0404) Pulse Rate: 80 (08/20 0404)  Labs: Recent Labs    09/29/21 1012 09/29/21 1040 09/29/21 1236 09/29/21 1521 09/29/21 2105 09/30/21 0431 09/30/21 0915 09/30/21 1100 10/01/21 0529  HGB 13.0  --   --  11.3*  --  10.9*  --   --   --   HCT 41.4  --   --  35.5*  --  34.6*  --   --   --   PLT 362  --   --  298  --  294  --   --   --   APTT  --    < >  --  118*   < > 72*  --  90* 162*  LABPROT  --   --   --  17.5*  --   --   --   --   --   INR  --   --   --  1.4*  --   --   --   --   --   HEPARINUNFRC  --   --   --  >1.10*  --  0.99*  --   --  0.82*  CREATININE 0.65  --   --  0.56  --  0.61  --  0.80 0.87  TROPONINIHS 397*  --  1,399* 1,665*  --   --  573*  --   --    < > = values in this interval not displayed.     Estimated Creatinine Clearance: 74 mL/min (by C-G formula based on SCr of 0.87 mg/dL).   Medical History: Past Medical History:   Diagnosis Date   A-fib ([place])    Anxiety disorder    Asthma    Dyspnea    Gastroschisis    umphalocele, rotated organs abd repair until age 13   GERD (gastroesophageal reflux disease)    HTN (hypertension)    Hyperlipidemia    IBS (irritable bowel syndrome)    Morbid obesity (HCC)    Target wt - 185  for BMI < 30   Obesity    OSA on CPAP    SBO (small bowel obstruction) ([place])    Resolved with NG/Bowel rest around 2009   Sleep apnea    Type II or unspecified type diabetes mellitus without mention of complication, not stated as uncontrolled     Assessment:  66 y.o. female w/ PMH of  atrial fibrillation, asthma, GERD, hypertension, dyslipidemia, IBS, OSA  on CPAP, type 2 diabetes mellitus and dyslipidemia,obesity having increasing shortness of breath and swelling in both her legs with presumed NSTEMI. She is noted to take apixaban with last dose this morning 09/29/21.  Goal of Therapy:  Heparin level 0.3-0.7 units/ml aPTT 66 - 102  seconds Monitor platelets by anticoagulation protocol: Yes  8/19 @ 0431: aPTT = 72 (therapeutic X 1),  HL = 0.99    Plan:  8/20 @ 0529:  HL = 0.82 , aPTT = 162 aPTT is SUPRAtherapeutic , HL still elevated from Eliquis -  Will hold heparin drip for 1 hr and restart @ 1100 units/hr. -  Will recheck aPTT 6 hrs after rate change -  Will recheck HL on 8/21 with AM labs.   Continue to monitor H&H and platelets  Jep Dyas D 10/01/2021,6:05 AM

## 2021-10-01 NOTE — Progress Notes (Signed)
   10/01/21 0819  Assess: MEWS Score  Pulse Rate 73  Resp (!) 22  SpO2 94 %  O2 Device Bi-PAP  FiO2 (%) 30 %  Assess: MEWS Score  MEWS Temp 0  MEWS Systolic 1  MEWS Pulse 0  MEWS RR 1  MEWS LOC 0  MEWS Score 2  MEWS Score Color Yellow  Assess: if the MEWS score is Yellow or Red  Were vital signs taken at a resting state? Yes  Focused Assessment Change from prior assessment (see assessment flowsheet)  Does the patient meet 2 or more of the SIRS criteria? Yes  Does the patient have a confirmed or suspected source of infection? No  MEWS guidelines implemented *See Row Information* Yes  Treat  MEWS Interventions Consulted Respiratory Therapy  Pain Scale 0-10  Pain Score 0  Take Vital Signs  Increase Vital Sign Frequency  Yellow: Q 2hr X 2 then Q 4hr X 2, if remains yellow, continue Q 4hrs  Escalate  MEWS: Escalate Yellow: discuss with charge nurse/RN and consider discussing with provider and RRT  Notify: Charge Nurse/RN  Name of Charge Nurse/RN Notified [place]  Date Charge Nurse/RN Notified 10/01/21  Time Charge Nurse/RN Notified 0805  Notify: Provider  Provider Name/Title [facility]  Date Provider Notified 10/01/21  Time Provider Notified 0820  Method of Notification Page  Notification Reason Change in status  Provider response Evaluate remotely  Date of Provider Response 10/01/21  Time of Provider Response [phone]  Document  Patient Outcome Not stable and remains on department  Assess: SIRS CRITERIA  SIRS Temperature  0  SIRS Pulse 0  SIRS Respirations  1  SIRS WBC 1  SIRS Score Sum  2   Will monitor

## 2021-10-01 NOTE — Progress Notes (Signed)
[facility] for initiation & monitoring of heparin infusion Indication: chest pain/ACS  Allergies  Allergen Reactions   Doxycycline Rash   Factive [Gemifloxacin Mesylate] Rash   Crestor [Rosuvastatin]     GI upset   Sulfonamide Derivatives     REACTION: rash   Gemifloxacin Rash   Penicillins Hives and Rash    Has patient had a PCN reaction causing immediate rash, facial/tongue/throat swelling, SOB or lightheadedness with hypotension: No Has patient had a PCN reaction causing severe rash involving mucus membranes or skin necrosis: No Has patient had a PCN reaction that required hospitalization No Has patient had a PCN reaction occurring within the last 10 years: No If all of the above answers are "NO", then may proceed with Cephalosporin use.     Patient Measurements: Height: 5' 7"  (170.2 cm) Weight: 87.1 kg (192 lb 0.3 oz) IBW/kg (Calculated) : 61.6 Heparin Dosing Weight: 80 kg  Vital Signs: Temp: 98.3 F (36.8 C) (08/20 1944) Temp Source: Oral (08/20 1944) BP: 103/57 (08/20 1944) Pulse Rate: 86 (08/20 1944)  Labs: Recent Labs    09/29/21 1521 09/29/21 2105 09/30/21 0431 09/30/21 0915 09/30/21 1100 10/01/21 0529 10/01/21 0826 10/01/21 1330 10/01/21 1928  HGB 11.3*  --  10.9*  --   --   --  9.7*  --   --   HCT 35.5*  --  34.6*  --   --   --  30.7*  --   --   PLT 298  --  294  --   --   --  215  --   --   APTT 118*   < > 72*  --  90* 162*  --  85* 69*  LABPROT 17.5*  --   --   --   --   --   --   --   --   INR 1.4*  --   --   --   --   --   --   --   --   HEPARINUNFRC >1.10*  --  0.99*  --   --  0.82*  --   --   --   CREATININE 0.56  --  0.61  --  0.80 0.87  --   --   --   TROPONINIHS 1,665*  --   --  573*  --  395*  --   --   --    < > = values in this interval not displayed.     Estimated Creatinine Clearance: 74 mL/min (by C-G formula based on SCr of 0.87 mg/dL).   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 female w/ PMH of  atrial fibrillation, asthma, GERD, hypertension, dyslipidemia, IBS, OSA on  CPAP, type 2 diabetes mellitus and dyslipidemia,obesity having increasing shortness of breath and swelling in both her legs with presumed NSTEMI. She is noted to take apixaban with last dose this morning 09/29/21.  Goal of Therapy:  Heparin level 0.3-0.7 units/ml aPTT 66 - 102  seconds Monitor platelets by anticoagulation protocol: Yes  8/19 @ 0431: aPTT = 72 sec(therapeutic X 1),  HL = 0.99  8/20 @ 0529:  HL = 0.82 sec, aPTT = 162 8/20 @ 1330: aPTT = 85 sec, therapeutic x 1  8/20 @1928 : aPTT = 69 sec, therapeutic x 2  Plan:  Therapeutic x2. Continue heparin infusion at 1100 units/hr Check aPTT/Anti-Xa level daily now that levels are consecutively therapeutic.  Titrate by aPTT's until lab correlation is noted, then titrate by anti-xa alone. Continue to monitor H&H and platelets daily while on heparin gtt.  Thank you for allowing pharmacy to be a part of this patient's care.  [place] Pharmacist 10/01/2021 8:19 PM

## 2021-10-01 NOTE — Progress Notes (Signed)
[facility] Cardiology  SUBJECTIVE: Patient laying in bed with BiPAP, denies chest pain   Vitals:   10/01/21 0336 10/01/21 0404 10/01/21 0754 10/01/21 0819  BP:  (!) 110/53 (!) 92/41   Pulse:  80 74 73  Resp:  20  (!) 22  Temp:  97.6 F (36.4 C) 99.1 F (37.3 C)   TempSrc:  Oral    SpO2: 95% 94%  94%  Weight:      Height:         Intake/Output Summary (Last 24 hours) at 10/01/2021 0849 Last data filed at 10/01/2021 0500 Gross per 24 hour  Intake 605.86 ml  Output 650 ml  Net -44.14 ml      PHYSICAL EXAM  General: Well developed, well nourished, in no acute distress HEENT:  Normocephalic and atramatic Neck:  No JVD.  Lungs: Clear bilaterally to auscultation and percussion. Heart: HRRR . Normal S1 and S2 without gallops or murmurs.  Abdomen: Bowel sounds are positive, abdomen soft and non-tender  Msk:  Back normal, normal gait. Normal strength and tone for age. Extremities: No clubbing, cyanosis or edema.   Neuro: Alert and oriented X 3. Psych:  Good affect, responds appropriately   LABS: Basic Metabolic Panel: Recent Labs    09/30/21 1100 09/30/21 1934 10/01/21 0529  NA 138  --  137  K 3.4* 4.1 3.6  CL 100  --  101  CO2 23  --  28  GLUCOSE 162*  --  119*  BUN 8  --  14  CREATININE 0.80  --  0.87  CALCIUM 9.4  --  9.0  MG 1.8  --   --    Liver Function Tests: Recent Labs    09/29/21 1012 09/29/21 1521  AST 60* 51*  ALT 60* 51*  ALKPHOS 101 79  BILITOT 1.0 0.9  PROT 7.2 5.8*  ALBUMIN 3.9 3.0*   No results for input(s): "LIPASE", "AMYLASE" in the last 72 hours. CBC: Recent Labs    09/29/21 1521 09/30/21 0431 10/01/21 0826  WBC 10.8* 15.0* 15.8*  NEUTROABS 8.4*  --   --   HGB 11.3* 10.9* 9.7*  HCT 35.5* 34.6* 30.7*  MCV 91.7 93.5 93.3  PLT 298 294 215   Cardiac Enzymes: No results for input(s): "CKTOTAL", "CKMB", "CKMBINDEX", "TROPONINI" in the last 72 hours. BNP: Invalid input(s): "POCBNP" D-Dimer: No results for input(s): "DDIMER" in the  last 72 hours. Hemoglobin A1C: No results for input(s): "HGBA1C" in the last 72 hours. Fasting Lipid Panel: Recent Labs    09/30/21 0431  CHOL 83  HDL 37*  LDLCALC 32  TRIG 70  CHOLHDL 2.2   Thyroid Function Tests: No results for input(s): "TSH", "T4TOTAL", "T3FREE", "THYROIDAB" in the last 72 hours.  Invalid input(s): "FREET3" Anemia Panel: No results for input(s): "VITAMINB12", "FOLATE", "FERRITIN", "TIBC", "IRON", "RETICCTPCT" in the last 72 hours.  ECHOCARDIOGRAM COMPLETE  Result Date: 09/30/2021    ECHOCARDIOGRAM REPORT   Patient Name:   Kirsten [name] Date of Exam: 09/30/2021 Medical Rec #:  [id number]        Height:       67.0 in Accession #:    [id number]       Weight:       192.0 lb Date of Birth:  [birth year]        BSA:          1.988 m Patient Age:    66 years         BP:  102/58 mmHg Patient Gender: F                HR:           90 bpm. Exam Location:  [facility] Procedure: 2D Echo and Intracardiac Opacification Agent Indications:     NSTEMI I21.4  History:         Patient has prior history of Echocardiogram examinations, most                  recent 12/12/2020.  Sonographer:     Kathlen Brunswick RDCS Referring Phys:  [id number] [place] TANG Diagnosing Phys: Isaias Cowman MD  Sonographer Comments: Technically challenging study due to limited acoustic windows, no parasternal window, no apical window and no subcostal window. The patient was on Bipap at the time of this study. There were no acoustic windows obtainable on this study. Definity IV ultrasound imaging agent attempted to enhance endocardial definition. IMPRESSIONS  1. Left ventricular ejection fraction, by estimation, is 55 to 60%. The left ventricle has normal function. The left ventricle has no regional wall motion abnormalities. Left ventricular diastolic parameters are indeterminate.  2. Right ventricular systolic function is normal. The right ventricular size is normal.  3. The mitral valve is normal in  structure. No evidence of mitral valve regurgitation. No evidence of mitral stenosis.  4. The aortic valve is normal in structure. Aortic valve regurgitation is not visualized. No aortic stenosis is present.  5. The inferior vena cava is normal in size with greater than 50% respiratory variability, suggesting right atrial pressure of 3 mmHg. FINDINGS  Left Ventricle: Left ventricular ejection fraction, by estimation, is 55 to 60%. The left ventricle has normal function. The left ventricle has no regional wall motion abnormalities. Definity contrast agent was given IV to delineate the left ventricular  endocardial borders. The left ventricular internal cavity size was normal in size. There is no left ventricular hypertrophy. Left ventricular diastolic parameters are indeterminate. Right Ventricle: The right ventricular size is normal. No increase in right ventricular wall thickness. Right ventricular systolic function is normal. Left Atrium: Left atrial size was normal in size. Right Atrium: Right atrial size was normal in size. Pericardium: There is no evidence of pericardial effusion. Mitral Valve: The mitral valve is normal in structure. No evidence of mitral valve regurgitation. No evidence of mitral valve stenosis. Tricuspid Valve: The tricuspid valve is normal in structure. Tricuspid valve regurgitation is not demonstrated. No evidence of tricuspid stenosis. Aortic Valve: The aortic valve is normal in structure. Aortic valve regurgitation is not visualized. No aortic stenosis is present. Pulmonic Valve: The pulmonic valve was normal in structure. Pulmonic valve regurgitation is not visualized. No evidence of pulmonic stenosis. Aorta: The aortic root is normal in size and structure. Venous: The inferior vena cava is normal in size with greater than 50% respiratory variability, suggesting right atrial pressure of 3 mmHg. IAS/Shunts: No atrial level shunt detected by color flow Doppler. Isaias Cowman MD  Electronically signed by Isaias Cowman MD Signature Date/Time: 09/30/2021/1:23:57 PM    Final    DG Shoulder Left  Result Date: 09/30/2021 CLINICAL DATA:  Left shoulder pain. EXAM: LEFT SHOULDER - 2+ VIEW COMPARISON:  None Available. FINDINGS: Distal left clavicle fracture as detailed under the left clavicle radiographs. Fracture of the posterolateral left fourth rib, chronicity unclear. Fracture of the posterior second rim that appears recent. No other fractures. Glenohumeral and AC joints are normally aligned. IMPRESSION: 1. Distal left clavicle fracture. 2. Fractures of  the posterior left second and posterolateral left fourth ribs, likely both acute/recent. 3. No other fractures.  No dislocation. Electronically Signed   By: Lajean Manes M.D.   On: 09/30/2021 10:43   DG Clavicle Left  Result Date: 09/30/2021 CLINICAL DATA:  Left shoulder pain. EXAM: LEFT CLAVICLE - 2+ VIEWS COMPARISON:  None Available. FINDINGS: Fracture of the distal left clavicle. There is an oblique fracture line extending from the inferior aspect of the distal clavicle, at the level of the coracoclavicular ligament, distally and superiorly. A transverse fracture component extends across the distal aspect of the clavicle, 9 mm central to the [facility] joint. Distal fracture component is mildly displaced superiorly, by 3-4 mm. No other fractures.  No bone lesions.  AC joint is normally aligned. IMPRESSION: 1. Distal left clavicle fracture as detailed.  No dislocation. Electronically Signed   By: Lajean Manes M.D.   On: 09/30/2021 10:41   DG Chest Port 1 View  Result Date: 09/30/2021 CLINICAL DATA:  Dyspnea left shoulder pain. EXAM: PORTABLE CHEST 1 VIEW COMPARISON:  09/29/2021 FINDINGS: 0837 hours. The cardio pericardial silhouette is enlarged. Vascular congestion with diffuse interstitial opacity suggests edema. Minimal bibasilar atelectasis/infiltrate noted. Bones are diffusely demineralized. Telemetry leads overlie the chest.  IMPRESSION: 1. Cardiomegaly with vascular congestion and diffuse interstitial opacity suggesting edema. 2. Minimal bibasilar atelectasis/infiltrate. Electronically Signed   By: Misty Stanley M.D.   On: 09/30/2021 09:21   CT Angio Chest PE W and/or Wo Contrast  Result Date: 09/29/2021 CLINICAL DATA:  Pulmonary embolism (PE) suspected, high prob. Shortness of breath bilateral leg swelling EXAM: CT ANGIOGRAPHY CHEST WITH CONTRAST TECHNIQUE: Multidetector CT imaging of the chest was performed using the standard protocol during bolus administration of intravenous contrast. Multiplanar CT image reconstructions and MIPs were obtained to evaluate the vascular anatomy. RADIATION DOSE REDUCTION: This exam was performed according to the departmental dose-optimization program which includes automated exposure control, adjustment of the mA and/or kV according to patient size and/or use of iterative reconstruction technique. CONTRAST:  80m OMNIPAQUE IOHEXOL 350 MG/ML SOLN COMPARISON:  CT chest 04/23/2017 FINDINGS: Cardiovascular: Satisfactory opacification of the pulmonary arteries to the segmental level. No evidence of pulmonary embolism. The main pulmonary artery is normal in caliber. Enlarged heart size. No significant pericardial effusion. The thoracic aorta is normal in caliber. Mild atherosclerotic plaque of the thoracic aorta. At least left anterior descending coronary artery calcifications. Mediastinum/Nodes: No enlarged mediastinal, hilar, or axillary lymph nodes. Thyroid gland, trachea, and esophagus demonstrate no significant findings. Lungs/Pleura: Expiratory phase of respiration. Left lower lobe consolidation may represent combination of passive atelectasis versus infection/inflammation. Right lower lobe atelectasis. No pulmonary nodule. No pulmonary mass. Trace left pleural effusion. No pneumothorax. Upper Abdomen: There is a 3.2 cm fluid dense lesion within the right kidney that likely represents a simple  renal cyst. Simple renal cysts, in the absence of clinically indicated signs/symptoms, require no independent follow-up. Musculoskeletal: No chest wall abnormality. No suspicious lytic or blastic osseous lesions. New from 2021, likely old healed minimally displaced 2-4 rib fractures. Age-indeterminate, likely acute, left displaced anterior 5-7 rib fractures as well as posteriorly displaced 6-7 rib fractures. No acute displaced fracture. Poorly visualized acute, comminuted, intra-articular left distal clavicular fracture. Review of the MIP images confirms the above findings. IMPRESSION: 1. No pulmonary embolus. 2. Trace left pleural effusion with left lower lobe consolidation may represent combination of passive atelectasis versus infection/inflammation. 3. Cardiomegaly. 4. Likely acute, left displaced anterior 5-7 rib fractures as well as posteriorly displaced  6-7 rib fractures (the 6 and 7 ribs are fractured in 2 different places). New from 2021, likely old healed minimally displaced 2-4 rib fractures. Recommend correlation with point tenderness to palpation to evaluate for an acute fracture. No associated pneumothorax. 5. Poorly visualized acute, comminuted, intra-articular left distal clavicular fracture. Recommend dedicated left shoulder and clavicle radiographs. Electronically Signed   By: Iven Finn M.D.   On: 09/29/2021 15:20   DG Chest 2 View  Result Date: 09/29/2021 CLINICAL DATA:  Shortness of breath EXAM: CHEST - 2 VIEW COMPARISON:  01/10/2021 FINDINGS: Chronic interstitial changes. New patchy density at the left lung base. No pleural effusion. No pneumothorax. Similar cardiomediastinal contours. No acute osseous abnormality. IMPRESSION: Patchy left basilar atelectasis/consolidation. Electronically Signed   By: Macy Mis M.D.   On: 09/29/2021 10:39     Echo technically difficult while patient on BiPAP, the function appears normal, with estimated LV ejection fraction 50-55% without  evidence for pericardial effusion  TELEMETRY: Sinus rhythm at 70 bpm:  ASSESSMENT AND PLAN:  Principal Problem:   NSTEMI (non-ST elevated myocardial infarction) ([place]) Active Problems:   Hypothyroidism   Essential hypertension, benign   OSA on CPAP   Paroxysmal atrial fibrillation (HCC)   Class 1 obesity with serious comorbidity and body mass index (BMI) of 34.0 to 34.9 in adult   Sepsis due to pneumonia ([place])   Dyslipidemia   Type 2 diabetes mellitus without complications ([place])   Anxiety and depression   Multiple rib fractures    1.  NSTEMI (397, 1399, 1665, 573,), in the absence of chest pain, with nondiagnostic ECG, in the setting of recent rib fractures and clavicular fracture, possible underlying pneumonia, troponin trending down, unlikely to account for respiratory failure 2.  Possible hospital-acquired pneumonia following recent hospitalization in [place] for multiple rib and clavicular fracture with chest x-ray and chest CT revealing left basilar consolidation, with elevated white count, seen respiratory status, requiring BiPAP, unclear etiology, the echocardiogram technically difficult but likely shows preserved 2D left ventricular function without pericardial effusion 3.  Paroxysmal atrial fibrillation, on Eliquis for stroke prevention, currently in sinus rhythm   Recommendations   1.  Agree with current therapy 2.  Hold Eliquis for now 3.  Continue heparin drip 4.  Lasix 40 mg IV x1 5.  Cardiac catheterization with selective coronary arteriography scheduled for 10/03/2021.  The risk, benefits and alternatives of cardiac catheterization and possible PCI were explained to the patient and informed consent was obtained.     Isaias Cowman, MD, PhD, [facility] 10/01/2021 8:49 AM

## 2021-10-01 NOTE — Progress Notes (Signed)
Patient has an order to be NPO for all of 08/20. Procedure scheduled for 08/21. Will inform oncoming nurse to let dietary know she can be NPO starting at 12 am on 08/21 (00:01) for  tentative procedure that day. Informed consent completed by previous nurse and in chart.  Patient was in fluid overload earlier this shift. 0.9% NaCl infusion at 87.1 ml not administered this shift due to extreme shortness of breath. KCl was also paused for the entire shift by Nurse Practitioner.  Waiting on this morning's Potassium value from Lab

## 2021-10-01 NOTE — Consult Note (Signed)
ORTHOPAEDIC CONSULTATION  REQUESTING PHYSICIAN: Wouk, Ailene Rud, MD  Chief Complaint:   L clavicle fracture  History of Present Illness: Kirsten [name] is a 66 y.o. female who had a fall approximately 2 weeks ago while in [place].  She sustained multiple left-sided rib fractures as well as a left distal clavicle fracture.  She was admitted to the hospital there but her symptoms had improved and she was discharged on 09/28/2021 and drove home.  She was found to have NSTEMI here as well as pneumonia.  Today, she reports minimal pain about the left shoulder at rest.  She does have some increased pain with certain motions.  She did have significant bruising about the shoulder that is improving.  She was told that she did not need any sort of surgical intervention while in [place].  She does have a history of atrial fibrillation on Eliquis at baseline, OSA with CPAP use, and diabetes.  Past Medical History:  Diagnosis Date   A-fib ([place])    Anxiety disorder    Asthma    Dyspnea    Gastroschisis    umphalocele, rotated organs abd repair until age 41   GERD (gastroesophageal reflux disease)    HTN (hypertension)    Hyperlipidemia    IBS (irritable bowel syndrome)    Morbid obesity ([facility])    Target wt - 185  for BMI < 30   Obesity    OSA on CPAP    SBO (small bowel obstruction) ([place])    Resolved with NG/Bowel rest around 2009   Sleep apnea    Type II or unspecified type diabetes mellitus without mention of complication, not stated as uncontrolled    Past Surgical History:  Procedure Laterality Date   BREAST EXCISIONAL BIOPSY Left 03/19/2012   neg   [place]   COLONOSCOPY  2011   2011-normal   Newborn Surgery - GI - ORGANS OUTSIDE ABDOMEN     Small Bowel Repair     TUBAL LIGATION  1988   Social History   Socioeconomic History   Marital status: Married    Spouse name: Not on  file   Number of children: 3   Years of education: Not on file   Highest education level: Not on file  Occupational History   Occupation: Retired    Comment: Receptionist at [employer]  Tobacco Use   Smoking status: Never   Smokeless tobacco: Never   Tobacco comments:    "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 Use: Never used  Substance and Sexual Activity   Alcohol use: Yes    Alcohol/week: 2.0 standard drinks of alcohol    Types: 2 Glasses of wine per week    Comment: rare   Drug use: No   Sexual activity: Yes    Birth control/protection: Surgical  Other Topics Concern   Not on file  Social History Narrative   Regular Exercise -  YES   Daily Caffeine Use:  2   Active and independent at baseline   Right handed   Lives in a one story home      Social Determinants of Health   Financial Resource Strain: Not on file  Food Insecurity: Not on file  Transportation Needs: Not on file  Physical Activity: Insufficiently Active (01/31/2017)   Exercise Vital Sign    Days of Exercise per Week: 2 days    Minutes of Exercise per Session:  30 min  Stress: Not on file  Social Connections: Not on file   Family History  Problem Relation Age of Onset   Lymphoma Mother    Diabetes type II Father    Colon cancer Father    Diabetes Maternal Grandmother    Diabetes type I Son    Goiter Neg Hx    Breast cancer Neg Hx    Ovarian cancer Neg Hx    Allergies  Allergen Reactions   Doxycycline Rash   Factive [Gemifloxacin Mesylate] Rash   Crestor [Rosuvastatin]     GI upset   Sulfonamide Derivatives     REACTION: rash   Gemifloxacin Rash   Penicillins Hives and Rash    Has patient had a PCN reaction causing immediate rash, facial/tongue/throat swelling, SOB or lightheadedness with hypotension: No Has patient had a PCN reaction causing severe rash involving mucus membranes or skin necrosis: No Has patient had a PCN reaction that required hospitalization No Has patient had a  PCN reaction occurring within the last 10 years: No If all of the above answers are "NO", then may proceed with Cephalosporin use.    Prior to Admission medications   Medication Sig Start Date End Date Taking? Authorizing Provider  amiodarone (PACERONE) 200 MG tablet Take 1 tablet (200 mg total) by mouth daily. 03/07/20  Yes Max Sane, MD  apixaban (ELIQUIS) 5 MG TABS tablet Take 1 tablet (5 mg total) by mouth 2 (two) times daily. 07/20/21  Yes [name], MD  atorvastatin (LIPITOR) 40 MG tablet TAKE ONE TABLET BY MOUTH ONE TIME DAILY 07/20/21  Yes [name], MD  cyanocobalamin (,VITAMIN B-12,) 1000 MCG/ML injection INJECT 1ML INTO THE MUSCLE ONCE A MONTH 08/21/21  Yes Abanoub Hanken, Donika K, DO  hydrochlorothiazide (HYDRODIURIL) 25 MG tablet Take 1 tablet by mouth daily. 05/27/20  Yes [provider]  levothyroxine (SYNTHROID) 175 MCG tablet Take 1 tablet (175 mcg total) by mouth daily. 07/20/21  Yes [name], MD  metFORMIN (GLUCOPHAGE) 1000 MG tablet TAKE ONE TABLET BY MOUTH TWICE A DAY 07/10/21  Yes [name], MD  metoprolol succinate (TOPROL-XL) 50 MG 24 hr tablet Take 1 tablet (50 mg total) by mouth daily. 12/20/20  Yes [name], MD  PARoxetine (PAXIL-CR) 25 MG 24 hr tablet TAKE 1 TABLET(25 MG) BY MOUTH DAILY 07/20/21  Yes [name], MD  albuterol (VENTOLIN HFA) 108 (90 Base) MCG/ACT inhaler Inhale 2 puffs into the lungs every 6 (six) hours as needed for wheezing or shortness of breath. 01/09/21   [name], MD  Continuous Blood Gluc Sensor (FREESTYLE LIBRE 3 SENSOR) MISC PLACE ONE SENSOR ON THE BACK OF YOUR UPPER ARM EVERY 14 DAYS 09/29/21   [name], MD  zolpidem (AMBIEN) 10 MG tablet Take 1 tablet (10 mg total) by mouth at bedtime as needed for sleep. 09/13/21   [name], MD   Recent Labs    09/29/21 1012 09/29/21 1521 09/30/21 0431 09/30/21 1100 09/30/21 1934 10/01/21 0529  WBC 14.9* 10.8* 15.0*  --   --   --   HGB 13.0 11.3* 10.9*  --    --   --   HCT 41.4 35.5* 34.6*  --   --   --   PLT 362 298 294  --   --   --   K 3.2* 3.1* 3.3* 3.4* 4.1 3.6  CL 104 103 105 100  --  101  CO2 25 26 25  23  --  28  BUN 10 8 8 8   --  14  CREATININE 0.65 0.56 0.61 0.80  --  0.87  GLUCOSE 170* 135* 181* 162*  --  119*  CALCIUM 10.1 9.2 9.0 9.4  --  9.0  INR  --  1.4*  --   --   --   --    ECHOCARDIOGRAM COMPLETE  Result Date: 09/30/2021    ECHOCARDIOGRAM REPORT   Patient Name:   [name] Date of Exam: 09/30/2021 Medical Rec #:  [id number]        Height:       67.0 in Accession #:    [id number]       Weight:       192.0 lb Date of Birth:  Jun 13, 1955        BSA:          1.988 m Patient Age:    89 years         BP:           102/58 mmHg Patient Gender: F                HR:           90 bpm. Exam Location:  [facility] Procedure: 2D Echo and Intracardiac Opacification Agent Indications:     NSTEMI I21.4  History:         Patient has prior history of Echocardiogram examinations, most                  recent 12/12/2020.  Sonographer:     Kathlen Brunswick RDCS Referring Phys:  [id number] [place] TANG Diagnosing Phys: Isaias Cowman MD  Sonographer Comments: Technically challenging study due to limited acoustic windows, no parasternal window, no apical window and no subcostal window. The patient was on Bipap at the time of this study. There were no acoustic windows obtainable on this study. Definity IV ultrasound imaging agent attempted to enhance endocardial definition. IMPRESSIONS  1. Left ventricular ejection fraction, by estimation, is 55 to 60%. The left ventricle has normal function. The left ventricle has no regional wall motion abnormalities. Left ventricular diastolic parameters are indeterminate.  2. Right ventricular systolic function is normal. The right ventricular size is normal.  3. The mitral valve is normal in structure. No evidence of mitral valve regurgitation. No evidence of mitral stenosis.  4. The aortic valve is normal in  structure. Aortic valve regurgitation is not visualized. No aortic stenosis is present.  5. The inferior vena cava is normal in size with greater than 50% respiratory variability, suggesting right atrial pressure of 3 mmHg. FINDINGS  Left Ventricle: Left ventricular ejection fraction, by estimation, is 55 to 60%. The left ventricle has normal function. The left ventricle has no regional wall motion abnormalities. Definity contrast agent was given IV to delineate the left ventricular  endocardial borders. The left ventricular internal cavity size was normal in size. There is no left ventricular hypertrophy. Left ventricular diastolic parameters are indeterminate. Right Ventricle: The right ventricular size is normal. No increase in right ventricular wall thickness. Right ventricular systolic function is normal. Left Atrium: Left atrial size was normal in size. Right Atrium: Right atrial size was normal in size. Pericardium: There is no evidence of pericardial effusion. Mitral Valve: The mitral valve is normal in structure. No evidence of mitral valve regurgitation. No evidence of mitral valve stenosis. Tricuspid Valve: The tricuspid valve is normal in structure. Tricuspid valve regurgitation is not  demonstrated. No evidence of tricuspid stenosis. Aortic Valve: The aortic valve is normal in structure. Aortic valve regurgitation is not visualized. No aortic stenosis is present. Pulmonic Valve: The pulmonic valve was normal in structure. Pulmonic valve regurgitation is not visualized. No evidence of pulmonic stenosis. Aorta: The aortic root is normal in size and structure. Venous: The inferior vena cava is normal in size with greater than 50% respiratory variability, suggesting right atrial pressure of 3 mmHg. IAS/Shunts: No atrial level shunt detected by color flow Doppler. Isaias Cowman MD Electronically signed by Isaias Cowman MD Signature Date/Time: 09/30/2021/1:23:57 PM    Final    DG Shoulder  Left  Result Date: 09/30/2021 CLINICAL DATA:  Left shoulder pain. EXAM: LEFT SHOULDER - 2+ VIEW COMPARISON:  None Available. FINDINGS: Distal left clavicle fracture as detailed under the left clavicle radiographs. Fracture of the posterolateral left fourth rib, chronicity unclear. Fracture of the posterior second rim that appears recent. No other fractures. Glenohumeral and AC joints are normally aligned. IMPRESSION: 1. Distal left clavicle fracture. 2. Fractures of the posterior left second and posterolateral left fourth ribs, likely both acute/recent. 3. No other fractures.  No dislocation. Electronically Signed   By: Lajean Manes M.D.   On: 09/30/2021 10:43   DG Clavicle Left  Result Date: 09/30/2021 CLINICAL DATA:  Left shoulder pain. EXAM: LEFT CLAVICLE - 2+ VIEWS COMPARISON:  None Available. FINDINGS: Fracture of the distal left clavicle. There is an oblique fracture line extending from the inferior aspect of the distal clavicle, at the level of the coracoclavicular ligament, distally and superiorly. A transverse fracture component extends across the distal aspect of the clavicle, 9 mm central to the [facility] joint. Distal fracture component is mildly displaced superiorly, by 3-4 mm. No other fractures.  No bone lesions.  AC joint is normally aligned. IMPRESSION: 1. Distal left clavicle fracture as detailed.  No dislocation. Electronically Signed   By: Lajean Manes M.D.   On: 09/30/2021 10:41   DG Chest Port 1 View  Result Date: 09/30/2021 CLINICAL DATA:  Dyspnea left shoulder pain. EXAM: PORTABLE CHEST 1 VIEW COMPARISON:  09/29/2021 FINDINGS: 0837 hours. The cardio pericardial silhouette is enlarged. Vascular congestion with diffuse interstitial opacity suggests edema. Minimal bibasilar atelectasis/infiltrate noted. Bones are diffusely demineralized. Telemetry leads overlie the chest. IMPRESSION: 1. Cardiomegaly with vascular congestion and diffuse interstitial opacity suggesting edema. 2. Minimal  bibasilar atelectasis/infiltrate. Electronically Signed   By: Misty Stanley M.D.   On: 09/30/2021 09:21   CT Angio Chest PE W and/or Wo Contrast  Result Date: 09/29/2021 CLINICAL DATA:  Pulmonary embolism (PE) suspected, high prob. Shortness of breath bilateral leg swelling EXAM: CT ANGIOGRAPHY CHEST WITH CONTRAST TECHNIQUE: Multidetector CT imaging of the chest was performed using the standard protocol during bolus administration of intravenous contrast. Multiplanar CT image reconstructions and MIPs were obtained to evaluate the vascular anatomy. RADIATION DOSE REDUCTION: This exam was performed according to the departmental dose-optimization program which includes automated exposure control, adjustment of the mA and/or kV according to patient size and/or use of iterative reconstruction technique. CONTRAST:  51m OMNIPAQUE IOHEXOL 350 MG/ML SOLN COMPARISON:  CT chest 04/23/2017 FINDINGS: Cardiovascular: Satisfactory opacification of the pulmonary arteries to the segmental level. No evidence of pulmonary embolism. The main pulmonary artery is normal in caliber. Enlarged heart size. No significant pericardial effusion. The thoracic aorta is normal in caliber. Mild atherosclerotic plaque of the thoracic aorta. At least left anterior descending coronary artery calcifications. Mediastinum/Nodes: No enlarged mediastinal, hilar, or axillary  lymph nodes. Thyroid gland, trachea, and esophagus demonstrate no significant findings. Lungs/Pleura: Expiratory phase of respiration. Left lower lobe consolidation may represent combination of passive atelectasis versus infection/inflammation. Right lower lobe atelectasis. No pulmonary nodule. No pulmonary mass. Trace left pleural effusion. No pneumothorax. Upper Abdomen: There is a 3.2 cm fluid dense lesion within the right kidney that likely represents a simple renal cyst. Simple renal cysts, in the absence of clinically indicated signs/symptoms, require no independent  follow-up. Musculoskeletal: No chest wall abnormality. No suspicious lytic or blastic osseous lesions. New from 2021, likely old healed minimally displaced 2-4 rib fractures. Age-indeterminate, likely acute, left displaced anterior 5-7 rib fractures as well as posteriorly displaced 6-7 rib fractures. No acute displaced fracture. Poorly visualized acute, comminuted, intra-articular left distal clavicular fracture. Review of the MIP images confirms the above findings. IMPRESSION: 1. No pulmonary embolus. 2. Trace left pleural effusion with left lower lobe consolidation may represent combination of passive atelectasis versus infection/inflammation. 3. Cardiomegaly. 4. Likely acute, left displaced anterior 5-7 rib fractures as well as posteriorly displaced 6-7 rib fractures (the 6 and 7 ribs are fractured in 2 different places). New from 2021, likely old healed minimally displaced 2-4 rib fractures. Recommend correlation with point tenderness to palpation to evaluate for an acute fracture. No associated pneumothorax. 5. Poorly visualized acute, comminuted, intra-articular left distal clavicular fracture. Recommend dedicated left shoulder and clavicle radiographs. Electronically Signed   By: Iven Finn M.D.   On: 09/29/2021 15:20   DG Chest 2 View  Result Date: 09/29/2021 CLINICAL DATA:  Shortness of breath EXAM: CHEST - 2 VIEW COMPARISON:  01/10/2021 FINDINGS: Chronic interstitial changes. New patchy density at the left lung base. No pleural effusion. No pneumothorax. Similar cardiomediastinal contours. No acute osseous abnormality. IMPRESSION: Patchy left basilar atelectasis/consolidation. Electronically Signed   By: Macy Mis M.D.   On: 09/29/2021 10:39     Positive ROS: All other systems have been reviewed and were otherwise negative with the exception of those mentioned in the HPI and as above.  Physical Exam: BP (!) 92/41 (BP Location: Right Arm)   Pulse 73   Temp 99.1 F (37.3 C)   Resp (!)  22   Ht 5' 7"  (1.702 m)   Wt 87.1 kg   SpO2 94%   BMI 30.07 kg/m  General:  Alert, no acute distress Psychiatric:  Patient is competent for consent with normal mood and affect    Orthopedic Exam:  LUE: +ain/pin/u motor SILT r/u/m/ax +rad pulse RoM: Able to passively forward flex to 130 degrees, externally rotate to 45 degrees.  Patient also has active range of motion with forward flexion to 110 degrees. Significant tenderness to palpation about the [facility] joint and distal clavicle.  There is also ecchymosis in this region   X-rays:  As above: Type I distal clavicle fracture  Assessment/Plan: 66 year old female who sustained a type I distal clavicle fracture on the left side approximately 2 weeks ago.  She is currently admitted for NSTEMI and also has concurrent multiple left-sided rib fractures 1.  No specific need for sling immobilization currently given injuries approximately 32 weeks old and patient has minimal pain at baseline.  2.  Recommend PT/OT when able.  Can work on regaining full passive range of motion and advance to active assisted range of motion exercises if tolerated.  When patient has minimal pain with active assisted more range of motion, she can advance towards active range of motion.  3.  Patient may weight-bear as tolerated  with the elbow by the side.  4.  She may follow-up at [facility] clinic as an outpatient approximately 2 weeks after discharge.  We will plan to follow peripherally.  Please page me with any questions.   Leim Fabry   10/01/2021 8:30 AM

## 2021-10-02 ENCOUNTER — Encounter
Admission: EM | Disposition: A | Discharge status: Home Health | Payer: Self-pay | Source: Home / Self Care | Attending: Obstetrics and Gynecology

## 2021-10-02 ENCOUNTER — Other Ambulatory Visit: Payer: Self-pay

## 2021-10-02 DIAGNOSIS — I214 Non-ST elevation (NSTEMI) myocardial infarction: Secondary | ICD-10-CM | POA: Diagnosis not present

## 2021-10-02 HISTORY — PX: RIGHT/LEFT HEART CATH AND CORONARY ANGIOGRAPHY: CATH118266

## 2021-10-02 LAB — APTT
aPTT: 37 seconds — ABNORMAL HIGH (ref 24–36)
aPTT: 126 seconds — ABNORMAL HIGH (ref 24–36)

## 2021-10-02 LAB — BASIC METABOLIC PANEL
Anion gap: 6 (ref 5–15)
BUN: 17 mg/dL (ref 8–23)
CO2: 30 mmol/L (ref 22–32)
Calcium: 8.8 mg/dL — ABNORMAL LOW (ref 8.9–10.3)
Chloride: 103 mmol/L (ref 98–111)
Creatinine, Ser: 0.86 mg/dL (ref 0.44–1.00)
GFR, Estimated: >60 mL/min (ref >60)
Glucose, Bld: 123 mg/dL — ABNORMAL HIGH (ref 70–99)
Potassium: 3.3 mmol/L — ABNORMAL LOW (ref 3.5–5.1)
Sodium: 139 mmol/L (ref 135–145)

## 2021-10-02 LAB — CBC WITH DIFFERENTIAL/PLATELET
Abs Immature Granulocytes: 0.05 10*3/uL (ref 0.00–0.07)
Basophils Absolute: 0 10*3/uL (ref 0.0–0.1)
Basophils Relative: 0 %
Eosinophils Absolute: 0.5 10*3/uL (ref 0.0–0.5)
Eosinophils Relative: 5 %
HCT: 29.5 % — ABNORMAL LOW (ref 36.0–46.0)
Hemoglobin: 9.2 g/dL — ABNORMAL LOW (ref 12.0–15.0)
Immature Granulocytes: 1 %
Lymphocytes Relative: 8 %
Lymphs Abs: 0.8 10*3/uL (ref 0.7–4.0)
MCH: 29.4 pg (ref 26.0–34.0)
MCHC: 31.2 g/dL (ref 30.0–36.0)
MCV: 94.2 fL (ref 80.0–100.0)
Monocytes Absolute: 0.5 10*3/uL (ref 0.1–1.0)
Monocytes Relative: 5 %
Neutro Abs: 7.4 10*3/uL (ref 1.7–7.7)
Neutrophils Relative %: 81 %
Platelets: 207 10*3/uL (ref 150–400)
RBC: 3.13 MIL/uL — ABNORMAL LOW (ref 3.87–5.11)
RDW: 14.9 % (ref 11.5–15.5)
WBC: 9.2 10*3/uL (ref 4.0–10.5)
nRBC: 0 % (ref 0.0–0.2)

## 2021-10-02 LAB — HEPARIN LEVEL (UNFRACTIONATED): Heparin Unfractionated: 0.16 IU/mL — ABNORMAL LOW (ref 0.30–0.70)

## 2021-10-02 LAB — LIPOPROTEIN A (LPA): Lipoprotein (a): 50.2 nmol/L — ABNORMAL HIGH (ref <75.0)

## 2021-10-02 SURGERY — RIGHT/LEFT HEART CATH AND CORONARY ANGIOGRAPHY
Anesthesia: Moderate Sedation

## 2021-10-02 MED ORDER — SODIUM CHLORIDE 0.9% FLUSH
3.0000 mL | Freq: Two times a day (BID) | INTRAVENOUS | Status: DC
  Administered 2021-10-03 – 2021-10-05 (×7): 3 mL via INTRAVENOUS

## 2021-10-02 MED ORDER — FUROSEMIDE 10 MG/ML IJ SOLN
INTRAMUSCULAR | Status: DC | PRN
  Administered 2021-10-02: 60 mg via INTRAVENOUS

## 2021-10-02 MED ORDER — POTASSIUM CHLORIDE CRYS ER 20 MEQ PO TBCR
60.0000 meq | EXTENDED_RELEASE_TABLET | Freq: Once | ORAL | Status: AC
  Administered 2021-10-02: 60 meq via ORAL
  Filled 2021-10-02: qty 3

## 2021-10-02 MED ORDER — FENTANYL CITRATE (PF) 100 MCG/2ML IJ SOLN
INTRAMUSCULAR | Status: AC
  Filled 2021-10-02: qty 2

## 2021-10-02 MED ORDER — HEPARIN (PORCINE) IN NACL 2000-0.9 UNIT/L-% IV SOLN
INTRAVENOUS | Status: DC | PRN
  Administered 2021-10-02: 1000 mL

## 2021-10-02 MED ORDER — LIDOCAINE HCL 1 % IJ SOLN
INTRAMUSCULAR | Status: AC
  Filled 2021-10-02: qty 20

## 2021-10-02 MED ORDER — FUROSEMIDE 10 MG/ML IJ SOLN
INTRAMUSCULAR | Status: AC
  Administered 2021-10-03: 60 mg via INTRAVENOUS
  Filled 2021-10-02: qty 2

## 2021-10-02 MED ORDER — LIDOCAINE HCL (PF) 1 % IJ SOLN
INTRAMUSCULAR | Status: DC | PRN
  Administered 2021-10-02: 10 mL
  Administered 2021-10-02 (×2): 2 mL

## 2021-10-02 MED ORDER — POTASSIUM CHLORIDE ER 10 MEQ PO TBCR: 20.0000 meq | EXTENDED_RELEASE_TABLET | Freq: Two times a day (BID) | ORAL | Status: DC

## 2021-10-02 MED ORDER — IOHEXOL 300 MG/ML  SOLN
INTRAMUSCULAR | Status: DC | PRN
  Administered 2021-10-02: 98 mL

## 2021-10-02 MED ORDER — MORPHINE SULFATE (PF) 2 MG/ML IV SOLN
INTRAVENOUS | Status: AC
  Administered 2021-10-02: 2 mg via INTRAVENOUS
  Filled 2021-10-02: qty 1

## 2021-10-02 MED ORDER — ACETAMINOPHEN 325 MG PO TABS
650.0000 mg | ORAL_TABLET | ORAL | Status: DC | PRN
  Filled 2021-10-02: qty 2

## 2021-10-02 MED ORDER — FUROSEMIDE 10 MG/ML IJ SOLN
INTRAMUSCULAR | Status: AC
  Filled 2021-10-02: qty 8

## 2021-10-02 MED ORDER — FUROSEMIDE 10 MG/ML IJ SOLN
60.0000 mg | Freq: Two times a day (BID) | INTRAMUSCULAR | Status: DC
  Administered 2021-10-03 – 2021-10-06 (×6): 60 mg via INTRAVENOUS
  Filled 2021-10-02 (×7): qty 6

## 2021-10-02 MED ORDER — HEPARIN (PORCINE) IN NACL 1000-0.9 UT/500ML-% IV SOLN
INTRAVENOUS | Status: AC
  Filled 2021-10-02: qty 1000

## 2021-10-02 MED ORDER — SODIUM CHLORIDE 0.9% FLUSH: 3.0000 mL | INTRAVENOUS | Status: DC | PRN

## 2021-10-02 MED ORDER — METOPROLOL SUCCINATE ER 25 MG PO TB24
25.0000 mg | ORAL_TABLET | Freq: Every day | ORAL | Status: DC
  Administered 2021-10-02 – 2021-10-03 (×2): 25 mg via ORAL
  Filled 2021-10-02 (×2): qty 1

## 2021-10-02 MED ORDER — LOSARTAN POTASSIUM 25 MG PO TABS
25.0000 mg | ORAL_TABLET | Freq: Every day | ORAL | Status: DC
  Administered 2021-10-03 – 2021-10-06 (×4): 25 mg via ORAL
  Filled 2021-10-02 (×5): qty 1

## 2021-10-02 MED ORDER — HYDRALAZINE HCL 20 MG/ML IJ SOLN: 10.0000 mg | INTRAMUSCULAR | Status: AC | PRN

## 2021-10-02 MED ORDER — MIDAZOLAM HCL 2 MG/2ML IJ SOLN
INTRAMUSCULAR | Status: AC
  Filled 2021-10-02: qty 2

## 2021-10-02 MED ORDER — HEPARIN BOLUS VIA INFUSION
2400.0000 [IU] | Freq: Once | INTRAVENOUS | Status: AC
  Administered 2021-10-02: 2400 [IU] via INTRAVENOUS
  Filled 2021-10-02: qty 2400

## 2021-10-02 MED ORDER — MIDAZOLAM HCL 2 MG/2ML IJ SOLN
INTRAMUSCULAR | Status: DC | PRN
  Administered 2021-10-02: .5 mg via INTRAVENOUS
  Administered 2021-10-02: 1 mg via INTRAVENOUS

## 2021-10-02 MED ORDER — SODIUM CHLORIDE 0.9 % WEIGHT BASED INFUSION
1.0000 mL/kg/h | INTRAVENOUS | Status: DC
Start: 2021-10-02 — End: 2021-10-02

## 2021-10-02 MED ORDER — HEPARIN SODIUM (PORCINE) 1000 UNIT/ML IJ SOLN
INTRAMUSCULAR | Status: AC
  Filled 2021-10-02: qty 10

## 2021-10-02 MED ORDER — HEPARIN SODIUM (PORCINE) 1000 UNIT/ML IJ SOLN
INTRAMUSCULAR | Status: DC | PRN
  Administered 2021-10-02: 4000 [IU] via INTRAVENOUS

## 2021-10-02 MED ORDER — ONDANSETRON HCL 4 MG/2ML IJ SOLN: 4.0000 mg | Freq: Four times a day (QID) | INTRAMUSCULAR | Status: DC | PRN

## 2021-10-02 MED ORDER — VERAPAMIL HCL 2.5 MG/ML IV SOLN
INTRAVENOUS | Status: DC | PRN
  Administered 2021-10-02 (×2): 2.5 mg via INTRA_ARTERIAL

## 2021-10-02 MED ORDER — LABETALOL HCL 5 MG/ML IV SOLN: 10.0000 mg | INTRAVENOUS | Status: AC | PRN

## 2021-10-02 MED ORDER — FENTANYL CITRATE (PF) 100 MCG/2ML IJ SOLN
INTRAMUSCULAR | Status: DC | PRN
Start: 2021-10-02 — End: 2021-10-02
  Administered 2021-10-02: 25 ug via INTRAVENOUS
  Administered 2021-10-02: 12.5 ug via INTRAVENOUS

## 2021-10-02 MED ORDER — FUROSEMIDE 10 MG/ML IJ SOLN
INTRAMUSCULAR | Status: AC
  Filled 2021-10-02: qty 4

## 2021-10-02 MED ORDER — SODIUM CHLORIDE 0.9 % IV SOLN: 250.0000 mL | INTRAVENOUS | Status: DC | PRN

## 2021-10-02 MED ORDER — IPRATROPIUM-ALBUTEROL 0.5-2.5 (3) MG/3ML IN SOLN
3.0000 mL | Freq: Four times a day (QID) | RESPIRATORY_TRACT | Status: DC
  Administered 2021-10-02 – 2021-10-05 (×11): 3 mL via RESPIRATORY_TRACT
  Filled 2021-10-02 (×12): qty 3

## 2021-10-02 MED ORDER — VERAPAMIL HCL 2.5 MG/ML IV SOLN
INTRAVENOUS | Status: AC
  Filled 2021-10-02: qty 2

## 2021-10-02 SURGICAL SUPPLY — 17 items
BAND CMPR LRG ZPHR (HEMOSTASIS) ×1
BAND ZEPHYR COMPRESS 30 LONG (HEMOSTASIS) IMPLANT
CATH 5FR JL3.5 JR4 ANG PIG MP (CATHETERS) IMPLANT
CATH SWAN GANZ 7F STRAIGHT (CATHETERS) IMPLANT
DRAPE BRACHIAL (DRAPES) IMPLANT
GLIDESHEATH SLEND SS 6F .021 (SHEATH) IMPLANT
GUIDEWIRE EMER 3M J .025X150CM (WIRE) IMPLANT
GUIDEWIRE INQWIRE 1.5J.035X260 (WIRE) IMPLANT
INQWIRE 1.5J .035X260CM (WIRE) ×1
NDL PERC 18GX7CM (NEEDLE) IMPLANT
NEEDLE PERC 18GX7CM (NEEDLE) ×1 IMPLANT
PACK CARDIAC CATH (CUSTOM PROCEDURE TRAY) ×1 IMPLANT
PROTECTION STATION PRESSURIZED (MISCELLANEOUS) ×1
SET ATX SIMPLICITY (MISCELLANEOUS) IMPLANT
SHEATH AVANTI 7FRX11 (SHEATH) IMPLANT
SHEATH GLIDE SLENDER 4/5FR (SHEATH) IMPLANT
STATION PROTECTION PRESSURIZED (MISCELLANEOUS) IMPLANT

## 2021-10-02 NOTE — Progress Notes (Addendum)
[place] for initiation & monitoring of heparin infusion Indication: chest pain/ACS  Allergies  Allergen Reactions   Doxycycline Rash   Factive [Gemifloxacin Mesylate] Rash   Crestor [Rosuvastatin]     GI upset   Sulfonamide Derivatives     REACTION: rash   Gemifloxacin Rash   Penicillins Hives and Rash    Has patient had a PCN reaction causing immediate rash, facial/tongue/throat swelling, SOB or lightheadedness with hypotension: No Has patient had a PCN reaction causing severe rash involving mucus membranes or skin necrosis: No Has patient had a PCN reaction that required hospitalization No Has patient had a PCN reaction occurring within the last 10 years: No If all of the above answers are "NO", then may proceed with Cephalosporin use.     Patient Measurements: Height: 5' 7"  (170.2 cm) Weight: 87.1 kg (192 lb 0.3 oz) IBW/kg (Calculated) : 61.6 Heparin Dosing Weight: 80 kg  Vital Signs: Temp: 98.7 F (37.1 C) (08/21 0433) Temp Source: Oral (08/21 0433) BP: 116/49 (08/21 0433) Pulse Rate: 85 (08/21 0433)  Labs: Recent Labs    09/29/21 1521 09/29/21 2105 09/30/21 0431 09/30/21 0915 09/30/21 1100 10/01/21 0529 10/01/21 0826 10/01/21 1330 10/01/21 1928 10/02/21 0502  HGB 11.3*  --  10.9*  --   --   --  9.7*  --   --  9.2*  HCT 35.5*  --  34.6*  --   --   --  30.7*  --   --  29.5*  PLT 298  --  294  --   --   --  215  --   --  207  APTT 118*   < > 72*  --  90* 162*  --  85* 69* 37*  LABPROT 17.5*  --   --   --   --   --   --   --   --   --   INR 1.4*  --   --   --   --   --   --   --   --   --   HEPARINUNFRC >1.10*  --  0.99*  --   --  0.82*  --   --   --  0.16*  CREATININE 0.56  --  0.61  --  0.80 0.87  --   --   --  0.86  TROPONINIHS 1,665*  --   --  573*  --  395*  --   --   --   --    < > = values in this interval not displayed.     Estimated Creatinine Clearance: 74.9 mL/min (by C-G formula based on SCr of 0.86  mg/dL).   Medical History: Past Medical History:  Diagnosis Date   A-fib ([place])    Anxiety disorder    Asthma    Dyspnea    Gastroschisis    umphalocele, rotated organs abd repair until age 57   GERD (gastroesophageal reflux disease)    HTN (hypertension)    Hyperlipidemia    IBS (irritable bowel syndrome)    Morbid obesity ([facility])    Target wt - 185  for BMI < 30   Obesity    OSA on CPAP    SBO (small bowel obstruction) ([place])    Resolved with NG/Bowel rest around 2009   Sleep apnea    Type II or unspecified type diabetes mellitus without mention of complication, not stated as uncontrolled     Assessment:  66 y.o. female w/ PMH of  atrial fibrillation, asthma, GERD, hypertension, dyslipidemia, IBS, OSA on CPAP, type 2 diabetes mellitus and dyslipidemia,obesity having increasing shortness of breath and swelling in both her legs with presumed NSTEMI. She is noted to take apixaban with last dose this morning 09/29/21.  Goal of Therapy:  Heparin level 0.3-0.7 units/ml aPTT 66 - 102  seconds Monitor platelets by anticoagulation protocol: Yes  8/19 @ 0431: aPTT = 72 sec(therapeutic X 1),  HL = 0.99  8/20 @ 0529:  HL = 0.82 sec, aPTT = 162 8/20 @ 1330: aPTT = 85 sec, therapeutic x 1  8/20 @1928 : aPTT = 69 sec, therapeutic x 2 8/21 @ 0502: aPTT = 37 sec, HL = 0.16  Plan:  8/21 @ 0502: aPTT = 37, HL = 0.16  - both HL and aPTT SUBtherapeutic this AM ,  RN reports that heparin gtt is running fine and there have been no interruptions  - Will order Heparin 2400 units IV X 1 bolus and increase drip rate     to 1400 units/hr - Will recheck aPTT 6 hrs after rate change - Will recheck HL on 8/22 with AM labs.   Hgb = 9.2 ,  Hct = 29.5 -  H & H have been trending down since admission.  Spoke with NP regarding possibility of bleeding.  Pt is scheduled for cath on 8/21 so possibly stop heparin drip.  Will need to f/u concern for bleeding with plans for anticoag.   Continue to monitor H&H  and platelets daily while on heparin gtt.  Thank you for allowing pharmacy to be a part of this patient's care.  [place] D Clinical Pharmacist 10/02/2021 6:02 AM

## 2021-10-02 NOTE — TOC CM/SW Note (Signed)
Patient on acute oxygen. RN hoping to wean off. TOC will follow in case she needs oxygen at discharge.  Dayton Scrape, [place]

## 2021-10-02 NOTE — Progress Notes (Signed)
Husband Britnay Magnussen called, Dr Saralyn Pilar at bedside updating pt and family

## 2021-10-02 NOTE — Care Management Important Message (Signed)
Important Message  Patient Details  [name] MRN: [id number] Date of Birth: [birth year]   Medicare Important Message Given:  Yes     Dannette Barbara 10/02/2021, 9:32 AM

## 2021-10-02 NOTE — Progress Notes (Addendum)
PROGRESS NOTE    Kirsten [name]  SPQ:[id number] DOB: 07-17-1955 DOA: 09/29/2021 PCP: [name], MD     Brief Narrative:   From admission h and p Kirsten [name] is a 66 y.o. Caucasian female with medical history significant for atrial fibrillation on Eliquis, asthma, GERD, hypertension, dyslipidemia, IBS, OSA on CPAP, type 2 diabetes mellitus and dyslipidemia and obesity, who presented to the ER  who was in [place] and fell with subsequent left-sided 8 ribs fracture as well as left clavicular fracture for which she was hospitalized for several days and had apparently had hypercarbic respiratory failure that was managed with improvement she was discharged yesterday and drove home.  This morning she was having increasing dyspnea and worsening lower extremity edema with no previous history of CHF.  She admits to left upper back pain that is not different from her pain with ribs and clavicular fracture.  She has been having mild cough with inability to expectorate though she was trying to clear her throat.  She admits to occasional wheezing and has been fatigued and tired.  She felt hot and chilly but did not have any reported fever.  No nausea or vomiting or abdominal pain or diarrhea or melena or bright red bleeding per rectum.  No other bleeding diathesis.  She denies any fever or chills.  She has a healing clean right leg sutured wound.   Assessment & Plan:   Principal Problem:   NSTEMI (non-ST elevated myocardial infarction) ([place]) Active Problems:   Paroxysmal atrial fibrillation (HCC)   Sepsis due to pneumonia ([place])   Hypothyroidism   Dyslipidemia   Type 2 diabetes mellitus without complications (HCC)   Anxiety and depression   Essential hypertension, benign   OSA on CPAP   Class 1 obesity with serious comorbidity and body mass index (BMI) of 34.0 to 34.9 in adult   Multiple rib fractures  # Stress cardiomyopathy Non-obstructive CAD seen on today's LHC, with apical wall  akinesis consistent w/ stress cardiomyopathy, estimated ef 35-45.  Significant physiologic stress from rib fractures. Troponin peak 1400, downtrending since. Received aspirin - cardiology following - stop heparin - resume metop - lasix 60 iv bid  # Acute hypoxic respiratory failure Secondary to rib fractures, nstemi, hcap, and volume overload. Rapid response 8/19 for volume overload, treated with bipap and lasix, promptly improved with that. Still requiring O2 though no respiratory distress - diurese as above  # HCAP # Sepsis Presenting with worsening dyspnea, cough, has leukocytosis, tachypnea. No PE on CT but does have LLL consolidation. Recent hospitalization. Covid negative - continue cefepime - f/u blood cultures, ngtd - sputum for culture if able to produce - f/u legionella antigen (strep neg) - incentive spirometry  # Loose stool Overnight x1. Likely 2/2 abx. No abd pain or fever. None since - stool studies if has diarrhea  # Rib fractures # Clavicle fracture Multiple rib fractures on left side also left clavicle fracture. Says hospitalized 12 days in [place] at [place], treated with pain control - ortho has seen, nothing to do here, f/u with them 2 wks  # Paroxysmal a fib Here rate controlled - cont home amio, resuming metop - resume apixaban tomorrow  # Hypothyroid - cont home levothyroxine  # Hypertension Here low bp - home meds on hold save for metop  # MDD - cont home paroxetine  # T2DM On metformin at home, here glucose appropriate - daily fasting sugars  # OSA - continue  home cpap qhs  # RLE laceration Suture repaired 2 weeks ago, sutures removed 8/19. Some erythema likely reactive. Photo taken - monitor for s/s infection  DVT prophylaxis: iv heparin Code Status: full Family Communication: husband updated @ bedside 8/20  Level of care: Progressive Status is: Inpatient Remains inpatient appropriate because: severity of  illness    Consultants:  cardiology  Procedures: none  Antimicrobials:  Cefepime>ceftriaxone>cefepime    Subjective: Tolerated cath, breathing stable  Objective: Vitals:   10/02/21 1605 10/02/21 1610 10/02/21 1635 10/02/21 1645  BP: 117/62 124/65 121/60 129/65  Pulse: 89 87 87 85  Resp: (!) 27 (!) 23 (!) 21 17  Temp:      TempSrc:      SpO2: 94% 96% 96% 99%  Weight:      Height:        Intake/Output Summary (Last 24 hours) at 10/02/2021 1653 Last data filed at 10/02/2021 1200 Gross per 24 hour  Intake 1008.09 ml  Output 400 ml  Net 608.09 ml   Filed Weights   09/29/21 1010  Weight: 87.1 kg    Examination:  General exam: mild pain Respiratory system: mid to lower lungs b/l worse on left Cardiovascular system: S1 & S2 heard, rrr Gastrointestinal system: Abdomen is obese, soft and nontender. No organomegaly or masses felt. Normal bowel sounds heard. Central nervous system: Alert and oriented. No focal neurological deficits. Extremities: Symmetric 5 x 5 power. Skin: some erythema surrounding RLE laceration, bruising on left abdomen/chest Psychiatry: Judgement and insight appear normal. Mood & affect appropriate.     Data Reviewed: I have personally reviewed following labs and imaging studies  CBC: Recent Labs  Lab 09/29/21 1012 09/29/21 1521 09/30/21 0431 10/01/21 0826 10/02/21 0502  WBC 14.9* 10.8* 15.0* 15.8* 9.2  NEUTROABS  --  8.4*  --   --  7.4  HGB 13.0 11.3* 10.9* 9.7* 9.2*  HCT 41.4 35.5* 34.6* 30.7* 29.5*  MCV 91.4 91.7 93.5 93.3 94.2  PLT 362 298 294 215 572   Basic Metabolic Panel: Recent Labs  Lab 09/29/21 1521 09/30/21 0431 09/30/21 1100 09/30/21 1934 10/01/21 0529 10/02/21 0502  NA 139 139 138  --  137 139  K 3.1* 3.3* 3.4* 4.1 3.6 3.3*  CL 103 105 100  --  101 103  CO2 26 25 23   --  28 30  GLUCOSE 135* 181* 162*  --  119* 123*  BUN 8 8 8   --  14 17  CREATININE 0.56 0.61 0.80  --  0.87 0.86  CALCIUM 9.2 9.0 9.4  --  9.0  8.8*  MG  --   --  1.8  --   --   --    GFR: Estimated Creatinine Clearance: 74.9 mL/min (by C-G formula based on SCr of 0.86 mg/dL). Liver Function Tests: Recent Labs  Lab 09/29/21 1012 09/29/21 1521  AST 60* 51*  ALT 60* 51*  ALKPHOS 101 79  BILITOT 1.0 0.9  PROT 7.2 5.8*  ALBUMIN 3.9 3.0*   No results for input(s): "LIPASE", "AMYLASE" in the last 168 hours. No results for input(s): "AMMONIA" in the last 168 hours. Coagulation Profile: Recent Labs  Lab 09/29/21 1521  INR 1.4*   Cardiac Enzymes: No results for input(s): "CKTOTAL", "CKMB", "CKMBINDEX", "TROPONINI" in the last 168 hours. BNP (last 3 results) No results for input(s): "PROBNP" in the last 8760 hours. HbA1C: No results for input(s): "HGBA1C" in the last 72 hours. CBG: Recent Labs  Lab 09/30/21 0823 09/30/21 1100  GLUCAP 159* 140*   Lipid Profile: Recent Labs    09/30/21 0431  CHOL 83  HDL 37*  LDLCALC 32  TRIG 70  CHOLHDL 2.2   Thyroid Function Tests: No results for input(s): "TSH", "T4TOTAL", "FREET4", "T3FREE", "THYROIDAB" in the last 72 hours. Anemia Panel: No results for input(s): "VITAMINB12", "FOLATE", "FERRITIN", "TIBC", "IRON", "RETICCTPCT" in the last 72 hours. Urine analysis:    Component Value Date/Time   COLORURINE YELLOW 08/17/2021 1049   APPEARANCEUR CLEAR 08/17/2021 1049   APPEARANCEUR Cloudy (A) 01/01/2017 1403   LABSPEC 1.015 08/17/2021 1049   PHURINE 6.0 08/17/2021 1049   GLUCOSEU NEGATIVE 08/17/2021 1049   HGBUR NEGATIVE 08/17/2021 1049   HGBUR trace-lysed 12/20/2009 1107   BILIRUBINUR NEGATIVE 08/17/2021 1049   BILIRUBINUR neg 06/06/2020 1553   BILIRUBINUR Negative 01/01/2017 1403   KETONESUR NEGATIVE 08/17/2021 1049   PROTEINUR Negative 09/07/2019 1442   PROTEINUR 1+ (A) 01/01/2017 1403   PROTEINUR NEGATIVE 07/12/2010 0738   UROBILINOGEN 0.2 08/17/2021 1049   NITRITE NEGATIVE 08/17/2021 1049   LEUKOCYTESUR NEGATIVE 08/17/2021 1049   Sepsis  Labs: @LABRCNTIP (procalcitonin:4,lacticidven:4)  ) Recent Results (from the past 240 hour(s))  Culture, blood (x 2)     Status: None (Preliminary result)   Collection Time: 09/29/21  7:00 PM   Specimen: BLOOD  Result Value Ref Range Status   Specimen Description BLOOD LEFT FA  Final   Special Requests   Final    BOTTLES DRAWN AEROBIC AND ANAEROBIC Blood Culture adequate volume   Culture   Final    NO GROWTH 3 DAYS Performed at [facility], [address], [place], [place] [address]    Report Status PENDING  Incomplete  Culture, blood (x 2)     Status: None (Preliminary result)   Collection Time: 09/29/21  9:05 PM   Specimen: BLOOD  Result Value Ref Range Status   Specimen Description BLOOD BLOOD LEFT FOREARM  Final   Special Requests   Final    BOTTLES DRAWN AEROBIC ONLY Blood Culture adequate volume   Culture   Final    NO GROWTH 3 DAYS Performed at [facility], [address], [place], [place] [address]    Report Status PENDING  Incomplete  Group A Strep by PCR     Status: None   Collection Time: 10/01/21  7:59 AM   Specimen: Throat; Sterile Swab  Result Value Ref Range Status   Group A Strep by PCR NOT DETECTED NOT DETECTED Final    Comment: Performed at [facility], [address] [place],  [address]  SARS Coronavirus 2 by RT PCR (hospital order, performed in [facility] hospital lab) *cepheid single result test* Throat     Status: None   Collection Time: 10/01/21  9:17 AM   Specimen: Throat; Nasal Swab  Result Value Ref Range Status   SARS Coronavirus 2 by RT PCR NEGATIVE NEGATIVE Final    Comment: (NOTE) SARS-CoV-2 target nucleic acids are NOT DETECTED.  The SARS-CoV-2 RNA is generally detectable in upper and lower respiratory specimens during the acute phase of infection. The lowest concentration of SARS-CoV-2 viral copies this assay can detect is 250 copies / mL. A negative result does not preclude SARS-CoV-2  infection and should not be used as the sole basis for treatment or other patient management decisions.  A negative result may occur with improper specimen collection / handling, submission of specimen other than nasopharyngeal swab, presence of viral mutation(s) within the areas targeted by this  assay, and inadequate number of viral copies (<250 copies / mL). A negative result must be combined with clinical observations, patient history, and epidemiological information.  Fact Sheet for Patients:   https://www.patel.info/  Fact Sheet for Healthcare Providers: https://hall.com/  This test is not yet approved or  cleared by the [place] FDA and has been authorized for detection and/or diagnosis of SARS-CoV-2 by FDA under an Emergency Use Authorization (EUA).  This EUA will remain in effect (meaning this test can be used) for the duration of the COVID-19 declaration under Section 564(b)(1) of the Act, 21 U.S.C. section 360bbb-3(b)(1), unless the authorization is terminated or revoked sooner.  Performed at [facility], [address], [place], [place] [address]          Radiology Studies: CARDIAC CATHETERIZATION  Result Date: 10/02/2021   Mid LAD lesion is 30% stenosed.   There is mild to moderate left ventricular systolic dysfunction.   LV end diastolic pressure is moderately elevated.   The left ventricular ejection fraction is 35-45% by visual estimate. 1.  Elevated pulmonary capillary wedge pressure, LVEDP consistent with pulmonary edema 2.  Nonobstructive coronary artery disease 3.  Mild to moderate reduced left ventricular function with apical wall akinesis consistent with Takotsubo's cardiomyopathy following recent fall and multiple rib and clavicular fracture Recommendations 1.  Medical therapy 2.  DC heparin 3.  Add metoprolol succinate and losartan 4.  Furosemide 60 mg IV twice daily 5.  Resume Eliquis on 10/03/2021         Scheduled Meds:  [MAR Hold] amiodarone  200 mg Oral Daily   [MAR Hold] aspirin EC  81 mg Oral Daily   [MAR Hold] atorvastatin  40 mg Oral Daily   furosemide  60 mg Intravenous Q12H   [MAR Hold] ipratropium-albuterol  3 mL Nebulization Q6H   [MAR Hold] levothyroxine  175 mcg Oral Daily   losartan  25 mg Oral Daily   metoprolol succinate  25 mg Oral Daily   [MAR Hold] PARoxetine  25 mg Oral Daily   potassium chloride  60 mEq Oral Once   [MAR Hold] sodium chloride flush  3 mL Intravenous Q12H   [START ON 10/03/2021] sodium chloride flush  3 mL Intravenous Q12H   Continuous Infusions:  sodium chloride 250 mL (10/02/21 1215)   [START ON 10/03/2021] sodium chloride     [MAR Hold] ceFEPime (MAXIPIME) IV Stopped (10/02/21 0954)     LOS: 3 days     Desma Maxim, MD [facility]   If 7PM-7AM, please contact night-coverage [email or web address] Password [facility] 10/02/2021, 4:53 PM

## 2021-10-02 NOTE — Progress Notes (Signed)
Pt drinking apple juice

## 2021-10-02 NOTE — Progress Notes (Signed)
[facility] Cardiology  SUBJECTIVE: Patient sitting in recliner, reports feeling better, less shortness of breath   Vitals:   10/02/21 0117 10/02/21 0433 10/02/21 0723 10/02/21 0745  BP:  (!) 116/49 (!) (P) 103/54 (!) 103/54  Pulse:  85 (P) 88 88  Resp:  14 (P) 18   Temp:  98.7 F (37.1 C) (P) 98.1 F (36.7 C)   TempSrc:  Oral (P) Oral   SpO2: 98% 97% (P) 98%   Weight:      Height:         Intake/Output Summary (Last 24 hours) at 10/02/2021 0801 Last data filed at 10/02/2021 6579 Gross per 24 hour  Intake 1237.18 ml  Output 400 ml  Net 837.18 ml      PHYSICAL EXAM  General: Well developed, well nourished, in no acute distress HEENT:  Normocephalic and atramatic Neck:  No JVD.  Lungs: Clear bilaterally to auscultation and percussion. Heart: HRRR . Normal S1 and S2 without gallops or murmurs.  Abdomen: Bowel sounds are positive, abdomen soft and non-tender  Msk:  Back normal, normal gait. Normal strength and tone for age. Extremities: No clubbing, cyanosis or edema.   Neuro: Alert and oriented X 3. Psych:  Good affect, responds appropriately   LABS: Basic Metabolic Panel: Recent Labs    09/30/21 1100 09/30/21 1934 10/01/21 0529 10/02/21 0502  NA 138  --  137 139  K 3.4*   < > 3.6 3.3*  CL 100  --  101 103  CO2 23  --  28 30  GLUCOSE 162*  --  119* 123*  BUN 8  --  14 17  CREATININE 0.80  --  0.87 0.86  CALCIUM 9.4  --  9.0 8.8*  MG 1.8  --   --   --    < > = values in this interval not displayed.   Liver Function Tests: Recent Labs    09/29/21 1012 09/29/21 1521  AST 60* 51*  ALT 60* 51*  ALKPHOS 101 79  BILITOT 1.0 0.9  PROT 7.2 5.8*  ALBUMIN 3.9 3.0*   No results for input(s): "LIPASE", "AMYLASE" in the last 72 hours. CBC: Recent Labs    09/29/21 1521 09/30/21 0431 10/01/21 0826 10/02/21 0502  WBC 10.8*   < > 15.8* 9.2  NEUTROABS 8.4*  --   --  7.4  HGB 11.3*   < > 9.7* 9.2*  HCT 35.5*   < > 30.7* 29.5*  MCV 91.7   < > 93.3 94.2  PLT 298    < > 215 207   < > = values in this interval not displayed.   Cardiac Enzymes: No results for input(s): "CKTOTAL", "CKMB", "CKMBINDEX", "TROPONINI" in the last 72 hours. BNP: Invalid input(s): "POCBNP" D-Dimer: No results for input(s): "DDIMER" in the last 72 hours. Hemoglobin A1C: No results for input(s): "HGBA1C" in the last 72 hours. Fasting Lipid Panel: Recent Labs    09/30/21 0431  CHOL 83  HDL 37*  LDLCALC 32  TRIG 70  CHOLHDL 2.2   Thyroid Function Tests: No results for input(s): "TSH", "T4TOTAL", "T3FREE", "THYROIDAB" in the last 72 hours.  Invalid input(s): "FREET3" Anemia Panel: No results for input(s): "VITAMINB12", "FOLATE", "FERRITIN", "TIBC", "IRON", "RETICCTPCT" in the last 72 hours.  ECHOCARDIOGRAM COMPLETE  Result Date: 09/30/2021    ECHOCARDIOGRAM REPORT   Patient Name:   Kirsten [name] Date of Exam: 09/30/2021 Medical Rec #:  [id number]        Height:  67.0 in Accession #:    [id number]       Weight:       192.0 lb Date of Birth:  09/17/1955        BSA:          1.988 m Patient Age:    66 years         BP:           102/58 mmHg Patient Gender: F                HR:           90 bpm. Exam Location:  [facility] Procedure: 2D Echo and Intracardiac Opacification Agent Indications:     NSTEMI I21.4  History:         Patient has prior history of Echocardiogram examinations, most                  recent 12/12/2020.  Sonographer:     Kathlen Brunswick RDCS Referring Phys:  [id number] Sonterra TANG Diagnosing Phys: Isaias Cowman MD  Sonographer Comments: Technically challenging study due to limited acoustic windows, no parasternal window, no apical window and no subcostal window. The patient was on Bipap at the time of this study. There were no acoustic windows obtainable on this study. Definity IV ultrasound imaging agent attempted to enhance endocardial definition. IMPRESSIONS  1. Left ventricular ejection fraction, by estimation, is 55 to 60%. The left ventricle has  normal function. The left ventricle has no regional wall motion abnormalities. Left ventricular diastolic parameters are indeterminate.  2. Right ventricular systolic function is normal. The right ventricular size is normal.  3. The mitral valve is normal in structure. No evidence of mitral valve regurgitation. No evidence of mitral stenosis.  4. The aortic valve is normal in structure. Aortic valve regurgitation is not visualized. No aortic stenosis is present.  5. The inferior vena cava is normal in size with greater than 50% respiratory variability, suggesting right atrial pressure of 3 mmHg. FINDINGS  Left Ventricle: Left ventricular ejection fraction, by estimation, is 55 to 60%. The left ventricle has normal function. The left ventricle has no regional wall motion abnormalities. Definity contrast agent was given IV to delineate the left ventricular  endocardial borders. The left ventricular internal cavity size was normal in size. There is no left ventricular hypertrophy. Left ventricular diastolic parameters are indeterminate. Right Ventricle: The right ventricular size is normal. No increase in right ventricular wall thickness. Right ventricular systolic function is normal. Left Atrium: Left atrial size was normal in size. Right Atrium: Right atrial size was normal in size. Pericardium: There is no evidence of pericardial effusion. Mitral Valve: The mitral valve is normal in structure. No evidence of mitral valve regurgitation. No evidence of mitral valve stenosis. Tricuspid Valve: The tricuspid valve is normal in structure. Tricuspid valve regurgitation is not demonstrated. No evidence of tricuspid stenosis. Aortic Valve: The aortic valve is normal in structure. Aortic valve regurgitation is not visualized. No aortic stenosis is present. Pulmonic Valve: The pulmonic valve was normal in structure. Pulmonic valve regurgitation is not visualized. No evidence of pulmonic stenosis. Aorta: The aortic root is  normal in size and structure. Venous: The inferior vena cava is normal in size with greater than 50% respiratory variability, suggesting right atrial pressure of 3 mmHg. IAS/Shunts: No atrial level shunt detected by color flow Doppler. Isaias Cowman MD Electronically signed by Isaias Cowman MD Signature Date/Time: 09/30/2021/1:23:57 PM    Final  DG Shoulder Left  Result Date: 09/30/2021 CLINICAL DATA:  Left shoulder pain. EXAM: LEFT SHOULDER - 2+ VIEW COMPARISON:  None Available. FINDINGS: Distal left clavicle fracture as detailed under the left clavicle radiographs. Fracture of the posterolateral left fourth rib, chronicity unclear. Fracture of the posterior second rim that appears recent. No other fractures. Glenohumeral and AC joints are normally aligned. IMPRESSION: 1. Distal left clavicle fracture. 2. Fractures of the posterior left second and posterolateral left fourth ribs, likely both acute/recent. 3. No other fractures.  No dislocation. Electronically Signed   By: Lajean Manes M.D.   On: 09/30/2021 10:43   DG Clavicle Left  Result Date: 09/30/2021 CLINICAL DATA:  Left shoulder pain. EXAM: LEFT CLAVICLE - 2+ VIEWS COMPARISON:  None Available. FINDINGS: Fracture of the distal left clavicle. There is an oblique fracture line extending from the inferior aspect of the distal clavicle, at the level of the coracoclavicular ligament, distally and superiorly. A transverse fracture component extends across the distal aspect of the clavicle, 9 mm central to the [facility] joint. Distal fracture component is mildly displaced superiorly, by 3-4 mm. No other fractures.  No bone lesions.  AC joint is normally aligned. IMPRESSION: 1. Distal left clavicle fracture as detailed.  No dislocation. Electronically Signed   By: Lajean Manes M.D.   On: 09/30/2021 10:41   DG Chest Port 1 View  Result Date: 09/30/2021 CLINICAL DATA:  Dyspnea left shoulder pain. EXAM: PORTABLE CHEST 1 VIEW COMPARISON:  09/29/2021  FINDINGS: 0837 hours. The cardio pericardial silhouette is enlarged. Vascular congestion with diffuse interstitial opacity suggests edema. Minimal bibasilar atelectasis/infiltrate noted. Bones are diffusely demineralized. Telemetry leads overlie the chest. IMPRESSION: 1. Cardiomegaly with vascular congestion and diffuse interstitial opacity suggesting edema. 2. Minimal bibasilar atelectasis/infiltrate. Electronically Signed   By: Misty Stanley M.D.   On: 09/30/2021 09:21     Echo technically difficult while patient on BiPAP, the function appears normal, with estimated LV ejection fraction 50-55% without evidence for pericardial effusion  TELEMETRY: Sinus rhythm 70 bpm:  ASSESSMENT AND PLAN:  Principal Problem:   NSTEMI (non-ST elevated myocardial infarction) ([place]) Active Problems:   Hypothyroidism   Essential hypertension, benign   OSA on CPAP   Paroxysmal atrial fibrillation (HCC)   Class 1 obesity with serious comorbidity and body mass index (BMI) of 34.0 to 34.9 in adult   Sepsis due to pneumonia ([place])   Dyslipidemia   Type 2 diabetes mellitus without complications ([place])   Anxiety and depression   Multiple rib fractures    1.  NSTEMI (397, 1399, 1665, 573,), in the absence of chest pain, with nondiagnostic ECG, in the setting of recent rib fractures and clavicular fracture, possible underlying pneumonia, troponin trending down, unlikely to account for respiratory failure 2.  Possible hospital-acquired pneumonia following recent hospitalization in [place] for multiple rib and clavicular fracture with chest x-ray and chest CT revealing left basilar consolidation, with elevated white count, seen respiratory status, requiring BiPAP, unclear etiology, the echocardiogram technically difficult but likely shows preserved 2D left ventricular function without pericardial effusion 3.  Paroxysmal atrial fibrillation, on Eliquis for stroke prevention, currently in sinus rhythm   Recommendations    1.  Agree with current therapy 2.  Hold Eliquis for now 3.  Continue heparin drip 4.  Lasix 40 mg IV as needed 5.  Cardiac catheterization with selective coronary arteriography scheduled for today.  The risk, benefits and alternatives of cardiac catheterization and possible PCI were explained to the patient and  informed consent was obtained.   Isaias Cowman, MD, PhD, [facility] 10/02/2021 8:01 AM

## 2021-10-03 ENCOUNTER — Encounter: Payer: Self-pay | Admitting: Cardiology

## 2021-10-03 DIAGNOSIS — I5181 Takotsubo syndrome: Secondary | ICD-10-CM

## 2021-10-03 LAB — BASIC METABOLIC PANEL
Anion gap: 4 — ABNORMAL LOW (ref 5–15)
BUN: 13 mg/dL (ref 8–23)
CO2: 32 mmol/L (ref 22–32)
Calcium: 9.1 mg/dL (ref 8.9–10.3)
Chloride: 101 mmol/L (ref 98–111)
Creatinine, Ser: 0.77 mg/dL (ref 0.44–1.00)
GFR, Estimated: >60 mL/min (ref >60)
Glucose, Bld: 138 mg/dL — ABNORMAL HIGH (ref 70–99)
Potassium: 4.2 mmol/L (ref 3.5–5.1)
Sodium: 137 mmol/L (ref 135–145)

## 2021-10-03 LAB — LEGIONELLA PNEUMOPHILA SEROGP 1 UR AG: L. pneumophila Serogp 1 Ur Ag: NEGATIVE

## 2021-10-03 MED ORDER — CEFADROXIL 500 MG PO CAPS
500.0000 mg | ORAL_CAPSULE | Freq: Two times a day (BID) | ORAL | Status: AC
  Administered 2021-10-03 – 2021-10-05 (×5): 500 mg via ORAL
  Filled 2021-10-03 (×5): qty 1

## 2021-10-03 MED ORDER — APIXABAN 5 MG PO TABS
5.0000 mg | ORAL_TABLET | Freq: Two times a day (BID) | ORAL | Status: DC
  Administered 2021-10-03 – 2021-10-06 (×7): 5 mg via ORAL
  Filled 2021-10-03 (×7): qty 1

## 2021-10-03 MED ORDER — MORPHINE SULFATE (PF) 2 MG/ML IV SOLN
2.0000 mg | INTRAVENOUS | Status: DC | PRN
  Administered 2021-10-04 – 2021-10-05 (×2): 2 mg via INTRAVENOUS
  Filled 2021-10-03 (×2): qty 1

## 2021-10-03 MED ORDER — METOPROLOL SUCCINATE ER 25 MG PO TB24
12.5000 mg | ORAL_TABLET | Freq: Every day | ORAL | Status: DC
  Administered 2021-10-04 – 2021-10-06 (×3): 12.5 mg via ORAL
  Filled 2021-10-03 (×4): qty 1

## 2021-10-03 MED ORDER — POTASSIUM CHLORIDE CRYS ER 20 MEQ PO TBCR
40.0000 meq | EXTENDED_RELEASE_TABLET | Freq: Once | ORAL | Status: AC
  Administered 2021-10-03: 40 meq via ORAL
  Filled 2021-10-03: qty 2

## 2021-10-03 MED ORDER — OXYCODONE HCL 5 MG PO TABS
5.0000 mg | ORAL_TABLET | ORAL | Status: DC | PRN
  Administered 2021-10-03 – 2021-10-06 (×5): 5 mg via ORAL
  Filled 2021-10-03 (×5): qty 1

## 2021-10-03 MED ORDER — POLYETHYLENE GLYCOL 3350 17 G PO PACK
17.0000 g | PACK | Freq: Every day | ORAL | Status: DC
  Filled 2021-10-03 (×4): qty 1

## 2021-10-03 MED ORDER — SENNA 8.6 MG PO TABS
1.0000 | ORAL_TABLET | Freq: Every day | ORAL | Status: DC
  Filled 2021-10-03 (×4): qty 1

## 2021-10-03 NOTE — Progress Notes (Signed)
PROGRESS NOTE    Kirsten [name]  QZR:[id number] DOB: 09/19/1955 DOA: 09/29/2021 PCP: [name], MD     Brief Narrative:   From admission h and p Kirsten [name] is a 66 y.o. Caucasian female with medical history significant for atrial fibrillation on Eliquis, asthma, GERD, hypertension, dyslipidemia, IBS, OSA on CPAP, type 2 diabetes mellitus and dyslipidemia and obesity, who presented to the ER  who was in [place] and fell with subsequent left-sided 8 ribs fracture as well as left clavicular fracture for which she was hospitalized for several days and had apparently had hypercarbic respiratory failure that was managed with improvement she was discharged yesterday and drove home.  This morning she was having increasing dyspnea and worsening lower extremity edema with no previous history of CHF.  She admits to left upper back pain that is not different from her pain with ribs and clavicular fracture.  She has been having mild cough with inability to expectorate though she was trying to clear her throat.  She admits to occasional wheezing and has been fatigued and tired.  She felt hot and chilly but did not have any reported fever.  No nausea or vomiting or abdominal pain or diarrhea or melena or bright red bleeding per rectum.  No other bleeding diathesis.  She denies any fever or chills.  She has a healing clean right leg sutured wound.   Assessment & Plan:   Principal Problem:   Stress-induced cardiomyopathy Active Problems:   Sepsis due to pneumonia ([place])   Hypothyroidism   Dyslipidemia   Type 2 diabetes mellitus without complications (HCC)   Anxiety and depression   Essential hypertension, benign   OSA on CPAP   Class 1 obesity with serious comorbidity and body mass index (BMI) of 34.0 to 34.9 in adult   Multiple rib fractures  # Stress-induced cardiomyopathy Non-obstructive CAD seen on 8/21 [facility], with apical wall akinesis consistent w/ stress cardiomyopathy, estimated ef  35-45.  Significant physiologic stress from rib fractures. Troponin peak 1400, downtrending since. Received aspirin. Heparin now discontinued - cardiology following - cont metop - lasix 60 iv bid  # Acute hypoxic respiratory failure Secondary to rib fractures, cardiomyopathy, hcap, and volume overload. Rapid response 8/19 for volume overload, treated with bipap and lasix, promptly improved with that. Still requiring O2 though no respiratory distress - diurese as above - o2 as above, wean as able  # HCAP # Sepsis Presenting with worsening dyspnea, cough, has leukocytosis, tachypnea. No PE on CT but does have LLL consolidation. Recent hospitalization. Covid negative - day 5 of cefepime, will switch to duricef today, plan for 2 more days abx (avoiding quinolone 2/2 qc and allergy, penicillins 2/2 hx hives) - f/u blood cultures, ngtd - sputum for culture if able to produce - f/u legionella antigen (strep neg) - incentive spirometry  # Loose stool Overnight x1. Likely 2/2 abx. No abd pain or fever. None since - stool studies if has diarrhea  # Rib fractures # Clavicle fracture Multiple rib fractures on left side also left clavicle fracture. Says hospitalized 12 days in [place] at [facility], treated with pain control - ortho has seen, nothing to do here, f/u with them 2 wks - pain control, bowel regimen  # Paroxysmal a fib Here rate controlled - cont home amio, resuming metop - resume apixaban   # Hypothyroid - cont home levothyroxine  # Hypertension Here low bp - home meds on hold save for metop  #  MDD - cont home paroxetine  # T2DM On metformin at home, here glucose appropriate - daily fasting sugars  # OSA - continue home cpap qhs  # RLE laceration Suture repaired 2 weeks ago, sutures removed 8/19. Some erythema likely reactive. Photo taken - monitor for s/s infection  DVT prophylaxis: home apixaban Code Status: full Family Communication: husband updated  telephonically 8/22  Level of care: Progressive Status is: Inpatient Remains inpatient appropriate because: severity of illness    Consultants:  cardiology  Procedures: none  Antimicrobials:  Cefepime>ceftriaxone>cefepime>duricef   Subjective: Feeling fatigued, ongoing rib pain, breathing improving  Objective: Vitals:   10/03/21 0312 10/03/21 0511 10/03/21 0725 10/03/21 0829  BP:  (!) 111/51 (!) 126/53   Pulse:  78 77   Resp:  17 16   Temp:   97.8 F (36.6 C)   TempSrc:   Oral   SpO2: 96% 99% 99% 99%  Weight:  90.9 kg    Height:        Intake/Output Summary (Last 24 hours) at 10/03/2021 1104 Last data filed at 10/03/2021 0800 Gross per 24 hour  Intake 175.59 ml  Output 2975 ml  Net -2799.41 ml   Filed Weights   09/29/21 1010 10/03/21 0511  Weight: 87.1 kg 90.9 kg    Examination:  General exam: mild pain Respiratory system: mid to lower lungs b/l worse on left Cardiovascular system: S1 & S2 heard, rrr Gastrointestinal system: Abdomen is obese, soft and nontender. No organomegaly or masses felt. Normal bowel sounds heard. Central nervous system: Alert and oriented. No focal neurological deficits. Extremities: Symmetric 5 x 5 power. Skin: some erythema surrounding RLE laceration, bruising on left abdomen/chest Psychiatry: Judgement and insight appear normal. Affect flat    Data Reviewed: I have personally reviewed following labs and imaging studies  CBC: Recent Labs  Lab 09/29/21 1012 09/29/21 1521 09/30/21 0431 10/01/21 0826 10/02/21 0502  WBC 14.9* 10.8* 15.0* 15.8* 9.2  NEUTROABS  --  8.4*  --   --  7.4  HGB 13.0 11.3* 10.9* 9.7* 9.2*  HCT 41.4 35.5* 34.6* 30.7* 29.5*  MCV 91.4 91.7 93.5 93.3 94.2  PLT 362 298 294 215 660   Basic Metabolic Panel: Recent Labs  Lab 09/30/21 0431 09/30/21 1100 09/30/21 1934 10/01/21 0529 10/02/21 0502 10/03/21 0500  NA 139 138  --  137 139 137  K 3.3* 3.4* 4.1 3.6 3.3* 4.2  CL 105 100  --  101 103 101   CO2 25 23  --  28 30 32  GLUCOSE 181* 162*  --  119* 123* 138*  BUN 8 8  --  14 17 13   CREATININE 0.61 0.80  --  0.87 0.86 0.77  CALCIUM 9.0 9.4  --  9.0 8.8* 9.1  MG  --  1.8  --   --   --   --    GFR: Estimated Creatinine Clearance: 82.2 mL/min (by C-G formula based on SCr of 0.77 mg/dL). Liver Function Tests: Recent Labs  Lab 09/29/21 1012 09/29/21 1521  AST 60* 51*  ALT 60* 51*  ALKPHOS 101 79  BILITOT 1.0 0.9  PROT 7.2 5.8*  ALBUMIN 3.9 3.0*   No results for input(s): "LIPASE", "AMYLASE" in the last 168 hours. No results for input(s): "AMMONIA" in the last 168 hours. Coagulation Profile: Recent Labs  Lab 09/29/21 1521  INR 1.4*   Cardiac Enzymes: No results for input(s): "CKTOTAL", "CKMB", "CKMBINDEX", "TROPONINI" in the last 168 hours. BNP (last 3 results) No  results for input(s): "PROBNP" in the last 8760 hours. HbA1C: No results for input(s): "HGBA1C" in the last 72 hours. CBG: Recent Labs  Lab 09/30/21 0823 09/30/21 1100  GLUCAP 159* 140*   Lipid Profile: No results for input(s): "CHOL", "HDL", "LDLCALC", "TRIG", "CHOLHDL", "LDLDIRECT" in the last 72 hours.  Thyroid Function Tests: No results for input(s): "TSH", "T4TOTAL", "FREET4", "T3FREE", "THYROIDAB" in the last 72 hours. Anemia Panel: No results for input(s): "VITAMINB12", "FOLATE", "FERRITIN", "TIBC", "IRON", "RETICCTPCT" in the last 72 hours. Urine analysis:    Component Value Date/Time   COLORURINE YELLOW 08/17/2021 1049   APPEARANCEUR CLEAR 08/17/2021 1049   APPEARANCEUR Cloudy (A) 01/01/2017 1403   LABSPEC 1.015 08/17/2021 1049   PHURINE 6.0 08/17/2021 1049   GLUCOSEU NEGATIVE 08/17/2021 1049   HGBUR NEGATIVE 08/17/2021 1049   HGBUR trace-lysed 12/20/2009 1107   BILIRUBINUR NEGATIVE 08/17/2021 1049   BILIRUBINUR neg 06/06/2020 1553   BILIRUBINUR Negative 01/01/2017 1403   KETONESUR NEGATIVE 08/17/2021 1049   PROTEINUR Negative 09/07/2019 1442   PROTEINUR 1+ (A) 01/01/2017 1403    PROTEINUR NEGATIVE 07/12/2010 0738   UROBILINOGEN 0.2 08/17/2021 1049   NITRITE NEGATIVE 08/17/2021 1049   LEUKOCYTESUR NEGATIVE 08/17/2021 1049   Sepsis Labs: @LABRCNTIP (procalcitonin:4,lacticidven:4)  ) Recent Results (from the past 240 hour(s))  Culture, blood (x 2)     Status: None (Preliminary result)   Collection Time: 09/29/21  7:00 PM   Specimen: BLOOD  Result Value Ref Range Status   Specimen Description BLOOD LEFT FA  Final   Special Requests   Final    BOTTLES DRAWN AEROBIC AND ANAEROBIC Blood Culture adequate volume   Culture   Final    NO GROWTH 3 DAYS Performed at [facility], [address] [place], [place] [address]    Report Status PENDING  Incomplete  Culture, blood (x 2)     Status: None (Preliminary result)   Collection Time: 09/29/21  9:05 PM   Specimen: BLOOD  Result Value Ref Range Status   Specimen Description BLOOD BLOOD LEFT FOREARM  Final   Special Requests   Final    BOTTLES DRAWN AEROBIC ONLY Blood Culture adequate volume   Culture   Final    NO GROWTH 3 DAYS Performed at [facility], [address], [place], [place] [address]    Report Status PENDING  Incomplete  Group A Strep by PCR     Status: None   Collection Time: 10/01/21  7:59 AM   Specimen: Throat; Sterile Swab  Result Value Ref Range Status   Group A Strep by PCR NOT DETECTED NOT DETECTED Final    Comment: Performed at [facility], [address] [place], [place] [address]  SARS Coronavirus 2 by RT PCR (hospital order, performed in [facility] hospital lab) *cepheid single result test* Throat     Status: None   Collection Time: 10/01/21  9:17 AM   Specimen: Throat; Nasal Swab  Result Value Ref Range Status   SARS Coronavirus 2 by RT PCR NEGATIVE NEGATIVE Final    Comment: (NOTE) SARS-CoV-2 target nucleic acids are NOT DETECTED.  The SARS-CoV-2 RNA is generally detectable in upper and lower respiratory specimens during the acute phase of  infection. The lowest concentration of SARS-CoV-2 viral copies this assay can detect is 250 copies / mL. A negative result does not preclude SARS-CoV-2 infection and should not be used as the sole basis for treatment or other patient management decisions.  A negative result may occur with  improper specimen collection / handling, submission of specimen other than nasopharyngeal swab, presence of viral mutation(s) within the areas targeted by this assay, and inadequate number of viral copies (<250 copies / mL). A negative result must be combined with clinical observations, patient history, and epidemiological information.  Fact Sheet for Patients:   https://www.patel.info/  Fact Sheet for Healthcare Providers: https://hall.com/  This test is not yet approved or  cleared by the [place] FDA and has been authorized for detection and/or diagnosis of SARS-CoV-2 by FDA under an Emergency Use Authorization (EUA).  This EUA will remain in effect (meaning this test can be used) for the duration of the COVID-19 declaration under Section 564(b)(1) of the Act, 21 U.S.C. section 360bbb-3(b)(1), unless the authorization is terminated or revoked sooner.  Performed at [facility], [address] [place], [place] [address]          Radiology Studies: CARDIAC CATHETERIZATION  Result Date: 10/02/2021   Mid LAD lesion is 30% stenosed.   There is mild to moderate left ventricular systolic dysfunction.   LV end diastolic pressure is moderately elevated.   The left ventricular ejection fraction is 35-45% by visual estimate. 1.  Elevated pulmonary capillary wedge pressure, LVEDP consistent with pulmonary edema 2.  Nonobstructive coronary artery disease 3.  Mild to moderate reduced left ventricular function with apical wall akinesis consistent with Takotsubo's cardiomyopathy following recent fall and multiple rib and clavicular fracture  Recommendations 1.  Medical therapy 2.  DC heparin 3.  Add metoprolol succinate and losartan 4.  Furosemide 60 mg IV twice daily 5.  Resume Eliquis on 10/03/2021        Scheduled Meds:  amiodarone  200 mg Oral Daily   apixaban  5 mg Oral BID   aspirin EC  81 mg Oral Daily   atorvastatin  40 mg Oral Daily   furosemide  60 mg Intravenous Q12H   ipratropium-albuterol  3 mL Nebulization Q6H   levothyroxine  175 mcg Oral Daily   losartan  25 mg Oral Daily   metoprolol succinate  25 mg Oral Daily   PARoxetine  25 mg Oral Daily   sodium chloride flush  3 mL Intravenous Q12H   sodium chloride flush  3 mL Intravenous Q12H   Continuous Infusions:  sodium chloride     ceFEPime (MAXIPIME) IV 2 g (10/03/21 0830)     LOS: 4 days     Desma Maxim, MD [facility]   If 7PM-7AM, please contact night-coverage [email or web address] Password [facility] 10/03/2021, 11:04 AM

## 2021-10-03 NOTE — Evaluation (Signed)
Physical Therapy Evaluation Patient Details [name] MRN: [id number] DOB: Jun 09, 1955 Today's Date: 10/03/2021  History of Present Illness  66 y.o. Caucasian female with medical history significant for atrial fibrillation on Eliquis, asthma, GERD, hypertension, dyslipidemia, IBS, OSA on CPAP, type 2 diabetes mellitus and dyslipidemia and obesity.~ 2 weeks prior to this ED arrival pt was visiting family in [place] and fell with subsequent left-sided 8 ribs fracture as well as left clavicular fracture for which she was hospitalized for several days and had apparently had hypercarbic respiratory failure.  Pt was discharged and drove (husband driving) back to [place] where she was home ~12 hours before needing to come to the ED.  Clinical Impression  Pt laying in bed on arrival looking fatigued and clearly in pain.  She showed good effort with most tasks but was limited due to pain, O2 status and general fatigue and feeling poorly.   She was not able to keep O2 in the 90s on either room air at rest or on 2L during modest bout of ambulation.  Pt very much wanting to go home but does admit that it would be very tough given how limited she is right now.       Recommendations for follow up therapy are one component of a multi-disciplinary discharge planning process, led by the attending physician.  Recommendations may be updated based on patient status, additional functional criteria and insurance authorization.  Follow Up Recommendations Skilled nursing-short term rehab (<3 hours/day) Can patient physically be transported by private vehicle: Yes    Assistance Recommended at Discharge Frequent or constant Supervision/Assistance  Patient can return home with the following  A little help with bathing/dressing/bathroom;A little help with walking and/or transfers;Assistance with cooking/housework;Assist for transportation;Help with stairs or ramp for entrance    Equipment Recommendations Rolling  walker (2 wheels) (per rogress)  Recommendations for Other Services       Functional Status Assessment Patient has had a recent decline in their functional status and demonstrates the ability to make significant improvements in function in a reasonable and predictable amount of time.     Precautions / Restrictions Precautions Precautions: Fall Restrictions Weight Bearing Restrictions: Yes RUE Weight Bearing:  (post stent, UE access precautions) LUE Weight Bearing: Weight bearing as tolerated (clav fx, WB ok with arm by side (per ortho))      Mobility  Bed Mobility Overal bed mobility: Needs Assistance Bed Mobility: Supine to Sit     Supine to sit: Min assist     General bed mobility comments: Pt did not require a lot of phyiscal assist, but needed heavy use of rails, extra time and was very pain and fatigue limited with the relatively modest effort.    Transfers Overall transfer level: Needs assistance Equipment used: Rolling walker (2 wheels) Transfers: Sit to/from Stand Sit to Stand: From elevated surface, Min assist           General transfer comment: Pt made good effort but could not rise w/o assist from standard height bed.  Raised it ~3", still unable to rise until PT provided assist and gave repeated verbal cues/explanation about sequencing, etc    Ambulation/Gait Ambulation/Gait assistance: Min assist Gait Distance (Feet): 15 Feet Assistive device: Rolling walker (2 wheels)         General Gait Details: Fortunately pt did not need to put much weight through walker (due to b/l UE WBing/pain limitations) and was able to take slow, guarded but safe steps to the door.  However she became very fatigue with this modest effort and even on 2L O2 her sats dropped to the low 80s with some DOE.  Deferred further ambulation at this time.  Stairs            Wheelchair Mobility    Modified Rankin (Stroke Patients Only)       Balance Overall balance  assessment: Needs assistance Sitting-balance support: No upper extremity supported Sitting balance-Leahy Scale: Good Sitting balance - Comments: uncomfortable/painful but no LOBs   Standing balance support: Bilateral upper extremity supported Standing balance-Leahy Scale: Fair Standing balance comment: Pt did not rely heavily on the walker, but quickly fatigued and needed to sit, did not show a lot of confidence.                             Pertinent Vitals/Pain Pain Assessment Pain Assessment: 0-10 Pain Score: 6  Pain Location: posterior ribs the worst - increased pain with most movement    Home Living Family/patient expects to be discharged to:: Private residence Living Arrangements: Spouse/significant other Available Help at Discharge: Family;Available 24 hours/day   Home Access: Stairs to enter Entrance Stairs-Rails: Can reach both Entrance Stairs-Number of Steps: 3   Home Layout: One level Home Equipment: None      Prior Function Prior Level of Function : Independent/Modified Independent             Mobility Comments: Prior to ~2 weeks ago pt was driving, active, independent       Hand Dominance        Extremity/Trunk Assessment                Communication   Communication: No difficulties  Cognition Arousal/Alertness: Awake/alert Behavior During Therapy: Restless (due to pain) Overall Cognitive Status: Within Functional Limits for tasks assessed                                          General Comments General comments (skin integrity, edema, etc.): Pt on 2L on arrival with sats laying in bed in high 90s, however with all attempts to wean she dropped quickly to 80s, similarly on O2 during activity SpO2 dropped quickly to low 80s.    Exercises     Assessment/Plan    PT Assessment Patient needs continued PT services  PT Problem List Decreased strength;Decreased range of motion;Decreased activity tolerance;Decreased  balance;Decreased mobility;Decreased knowledge of use of DME;Decreased safety awareness;Pain;Cardiopulmonary status limiting activity       PT Treatment Interventions DME instruction;Gait training;Functional mobility training;Stair training;Therapeutic activities;Therapeutic exercise;Balance training;Neuromuscular re-education;Patient/family education    PT Goals (Current goals can be found in the Care Plan section)  Acute Rehab PT Goals Patient Stated Goal: go home PT Goal Formulation: With patient Time For Goal Achievement: 10/16/21 Potential to Achieve Goals: Fair    Frequency Min 2X/week     Co-evaluation               AM-PAC PT "6 Clicks" Mobility  Outcome Measure Help needed turning from your back to your side while in a flat bed without using bedrails?: A Little Help needed moving from lying on your back to sitting on the side of a flat bed without using bedrails?: A Little Help needed moving to and from a bed to a chair (including a wheelchair)?: A Little Help needed standing  up from a chair using your arms (e.g., wheelchair or bedside chair)?: A Lot Help needed to walk in hospital room?: A Lot Help needed climbing 3-5 steps with a railing? : A Lot 6 Click Score: 15    End of Session Equipment Utilized During Treatment: Gait belt Activity Tolerance: Patient tolerated treatment well Patient left: with call bell/phone within reach;in chair Nurse Communication: Mobility status (O2 status) PT Visit Diagnosis: Muscle weakness (generalized) (M62.81);Difficulty in walking, not elsewhere classified (R26.2);History of falling (Z91.81);Pain;Unsteadiness on feet (R26.81) Pain - Right/Left: Left Pain - part of body: Shoulder    Time: 3435-6861 PT Time Calculation (min) (ACUTE ONLY): 27 min   Charges:   PT Evaluation $PT Eval Low Complexity: 1 Low PT Treatments $Gait Training: 8-22 mins        Kreg Shropshire, DPT 10/03/2021, 1:22 PM

## 2021-10-03 NOTE — Progress Notes (Signed)
[facility] Cardiology  Patient ID: Kirsten [name] MRN: [id number] DOB/AGE: [birth year] 66 y.o.   Admit date: 09/29/2021 Referring Physician [facility] Primary Physician Ronnald Ramp Primary Cardiologist Humphrey Rolls Reason for Consultation elevated troponin   HPI: 66 year old female referred for evaluation of elevated troponin.  Patient has a history of paroxysmal atrial fibrillation, on Eliquis for stroke prevention and amiodarone for rhythm control, followed by Dr. Humphrey Rolls.  She was recently in [place], fell x-ray of multiple ribs clavicle hospitalized several days, discharged yesterday and drove home.  This morning, he patient noted worsening shortness of breath, cough and lower extremity edema and presented to [facility] ED.  ECG revealed sinus rhythm with nonspecific intraventricular conduction delay which did not appear changed compared to most recent ECG.  Lab work was notable for elevated high-sensitivity troponin (397, 1399).  The patient denies chest pain other than chest wall discomfort due to rib fractures.  Other notable lab work included elevated white count of 14,900.  Chest x-ray revealed patchy left basilar atelectasis.  ET scan did not reveal evidence for pulmonary embolus, did show left lower lobe consolidation, possible infection/inflammation, left displaced anterior 5-7 rib fractures, left distal clavicular fracture.  Interval History: -R/LHC performed 10/02/2021 resulted with nonobstructive CAD with mild to moderate reduced LV function, apical wall akinesis C/W tach Subu's cardiomyopathy in the setting of recent fall, multiple rib and clavicle fractures and significant physiologic stress. -Feels about the same today, although notes lower extremity edema has much improved -Remains on supplemental oxygen, lower extremity edema much improved compared to admission -Remains with left shoulder pain, also has some discomfort in her right arm from infiltrated IV yesterday.  No chest pain, shortness of  breath    Vitals:   10/03/21 0511 10/03/21 0725 10/03/21 0829 10/03/21 1126  BP: (!) 111/51 (!) 126/53  (!) 92/52  Pulse: 78 77  68  Resp: 17 16  14   Temp:  97.8 F (36.6 C)  97.8 F (36.6 C)  TempSrc:  Oral  Oral  SpO2: 99% 99% 99% 99%  Weight: 90.9 kg     Height:         Intake/Output Summary (Last 24 hours) at 10/03/2021 1300 Last data filed at 10/03/2021 0800 Gross per 24 hour  Intake 150.68 ml  Output 2975 ml  Net -2824.32 ml    PHYSICAL EXAM  General: Pleasant ill-appearing Caucasian female.  Sitting upright in recliner. HEENT:  Normocephalic and atramatic Neck:  No JVD.  Lungs: Normal respiratory effort on oxygen by nasal cannula.  Bibasilar crackles. Heart: HRRR . Normal S1 and S2 without gallops or murmurs.  Abdomen: Bowel sounds are positive, abdomen soft and non-tender  Msk:  Normal strength and tone for age. Extremities: Right upper extremity with significant ecchymosis and tenderness from infiltrated IV.  No clubbing, cyanosis.  1+ bilateral lower extremity edema, overall improving Neuro: Alert and oriented X 3. Psych:  Good affect, responds appropriately   LABS: Basic Metabolic Panel: Recent Labs    10/02/21 0502 10/03/21 0500  NA 139 137  K 3.3* 4.2  CL 103 101  CO2 30 32  GLUCOSE 123* 138*  BUN 17 13  CREATININE 0.86 0.77  CALCIUM 8.8* 9.1    Liver Function Tests: No results for input(s): "AST", "ALT", "ALKPHOS", "BILITOT", "PROT", "ALBUMIN" in the last 72 hours.  No results for input(s): "LIPASE", "AMYLASE" in the last 72 hours. CBC: Recent Labs    10/01/21 0826 10/02/21 0502  WBC 15.8* 9.2  NEUTROABS  --  7.4  HGB 9.7* 9.2*  HCT 30.7* 29.5*  MCV 93.3 94.2  PLT 215 207    Cardiac Enzymes: No results for input(s): "CKTOTAL", "CKMB", "CKMBINDEX", "TROPONINI" in the last 72 hours. BNP: Invalid input(s): "POCBNP" D-Dimer: No results for input(s): "DDIMER" in the last 72 hours. Hemoglobin A1C: No results for input(s): "HGBA1C"  in the last 72 hours. Fasting Lipid Panel: No results for input(s): "CHOL", "HDL", "LDLCALC", "TRIG", "CHOLHDL", "LDLDIRECT" in the last 72 hours.  Thyroid Function Tests: No results for input(s): "TSH", "T4TOTAL", "T3FREE", "THYROIDAB" in the last 72 hours.  Invalid input(s): "FREET3" Anemia Panel: No results for input(s): "VITAMINB12", "FOLATE", "FERRITIN", "TIBC", "IRON", "RETICCTPCT" in the last 72 hours.  CARDIAC CATHETERIZATION  Result Date: 10/02/2021   Mid LAD lesion is 30% stenosed.   There is mild to moderate left ventricular systolic dysfunction.   LV end diastolic pressure is moderately elevated.   The left ventricular ejection fraction is 35-45% by visual estimate. 1.  Elevated pulmonary capillary wedge pressure, LVEDP consistent with pulmonary edema 2.  Nonobstructive coronary artery disease 3.  Mild to moderate reduced left ventricular function with apical wall akinesis consistent with Takotsubo's cardiomyopathy following recent fall and multiple rib and clavicular fracture Recommendations 1.  Medical therapy 2.  DC heparin 3.  Add metoprolol succinate and losartan 4.  Furosemide 60 mg IV twice daily 5.  Resume Eliquis on 10/03/2021     Echo technically difficult while patient on BiPAP, the function appears normal, with estimated LV ejection fraction 50-55% without evidence for pericardial effusion  TELEMETRY reviewed by me (LT): Sinus rhythm 70 to 80 bpm  Data reviewed by me (LT): Hospitalist note, BMP, telemetry, LHC note, PT note  ASSESSMENT AND PLAN:  Principal Problem:   Stress-induced cardiomyopathy Active Problems:   Hypothyroidism   Essential hypertension, benign   OSA on CPAP   Class 1 obesity with serious comorbidity and body mass index (BMI) of 34.0 to 34.9 in adult   Sepsis due to pneumonia ([place])   Dyslipidemia   Type 2 diabetes mellitus without complications ([place])   Anxiety and depression   Multiple rib fractures    1.  NSTEMI (397, 1399, 1665, 573,),  in the absence of chest pain, with nondiagnostic ECG, in the setting of recent rib fractures and clavicular fracture, possible underlying pneumonia, troponin trending down. 2.  Takotsubo cardiomyopathy, LHC with nonobstructive CAD, LV gram with apical wall akinesis in the setting of significant physiologic stress from fall, multiple rib fractures 3.  Possible hospital-acquired pneumonia following recent hospitalization in [place] for multiple rib and clavicular fracture with chest x-ray and chest CT revealing left basilar consolidation, with elevated white count, seen respiratory status, requiring BiPAP, unclear etiology, the echocardiogram technically difficult but likely shows preserved 2D left ventricular function without pericardial effusion 4.  Paroxysmal atrial fibrillation, on Eliquis for stroke prevention, currently in sinus rhythm   Recommendations   1.  Agree with current therapy 2.  Restart Eliquis 5 mg twice daily today, continue aspirin 81 mg daily for now, will likely 3.  Continue amiodarone 200 mg once daily for rhythm control in addition to metoprolol XL 12.5 mg once daily, losartan 25 mg once daily for GDMT 4.  Continue 60 mg IV Lasix twice daily  5.  Continue atorvastatin 40 mg daily 6.  Hopefully okay for discharge from a cardiac standpoint likely Thursday morning after further diuresis if oxygen can be weaned and the patient is ambulating without significant dyspnea.  This patient's plan of  care was discussed and created with Dr. Saralyn Pilar and he is in agreement.    Tristan Schroeder, PA-C 10/03/2021 1:00 PM

## 2021-10-03 NOTE — Evaluation (Signed)
Occupational Therapy Evaluation Patient Details [name] MRN: [id number] DOB: [birth year] Today's Date: 10/03/2021   History of Present Illness 66 y.o. Caucasian female with medical history significant for atrial fibrillation on Eliquis, asthma, GERD, hypertension, dyslipidemia, IBS, OSA on CPAP, type 2 diabetes mellitus and dyslipidemia and obesity.~ 2 weeks prior to this ED arrival pt was visiting family in [place] and fell with subsequent left-sided 8 ribs fracture as well as left clavicular fracture for which she was hospitalized for several days and had apparently had hypercarbic respiratory failure.  Pt was discharged and drove (husband driving) back to [place] where she was home ~12 hours before needing to come to the ED.   Clinical Impression   Pt seen for OT evaluation this date. Pt was independent in all ADL and functional mobility until fall in [place] while visiting family. Pt received in bed, endorsing just back to bed from the recliner with staff and having 6/10 pain across L shoulder and back. RN just provided pain meds before OT's arrival. Pt politely declining EOB/OOB with OT 2/2 pain and fatigue. Pain and decreased strength limiting functional UE use for ADL tasks and mobility. Pt also on supplemental O2 at this time which she does not require at baseline. Pt instructed in PROM vs AAROM vs AROM for LUE per ortho surgeon and pt able to return demo gross full PROM, ~75% AAROM in L shoulder ROM. Pt instructed in energy conservation strategies to support safety and indep with ADL while minimizing over exertion and SOB, including activity pacing, work simplification, AE/DME. Pt verbalized understanding. OT educated pt in recommendations and goals for her during her hospitalization. Pt eager to improve in order to go home. Will further assess additional ADL/mobility next session and update recommendations as appropriate. Recommend SNF at this time pending further improvements.     Recommendations for follow up therapy are one component of a multi-disciplinary discharge planning process, led by the attending physician.  Recommendations may be updated based on patient status, additional functional criteria and insurance authorization.   Follow Up Recommendations  Skilled nursing-short term rehab (<3 hours/day)    Assistance Recommended at Discharge Intermittent Supervision/Assistance  Patient can return home with the following A little help with walking and/or transfers;A lot of help with bathing/dressing/bathroom;Assistance with cooking/housework;Assist for transportation;Help with stairs or ramp for entrance    Functional Status Assessment  Patient has had a recent decline in their functional status and demonstrates the ability to make significant improvements in function in a reasonable and predictable amount of time.  Equipment Recommendations  BSC/3in1;Other (comment) (2WW)    Recommendations for Other Services       Precautions / Restrictions Precautions Precautions: Fall Restrictions Weight Bearing Restrictions: Yes RUE Weight Bearing:  (post stent, UE access precautions) LUE Weight Bearing: Weight bearing as tolerated (clav fx, WB ok with arm by side (per ortho))      Mobility Bed Mobility               General bed mobility comments: pt declined, reporting just back to bed after being up in recliner for majority of the day, just had pain meds prior to OT's arrival    Transfers                   General transfer comment: pt declined, reporting just back to bed after being up in recliner for majority of the day, just had pain meds prior to OT's arrival  Balance                                           ADL either performed or assessed with clinical judgement   ADL                                         General ADL Comments: Pt currently requires MOD A for LB ADL tasks, primarily pain  limited, reports spouse able to provide needed level of assist.     Vision         Perception     Praxis      Pertinent Vitals/Pain Pain Assessment Pain Assessment: 0-10 Pain Score: 6  Pain Location: posterior ribs the worst - increased pain with most movement Pain Descriptors / Indicators: Aching Pain Intervention(s): Limited activity within patient's tolerance, Monitored during session, Premedicated before session, Repositioned     Hand Dominance     Extremity/Trunk Assessment Upper Extremity Assessment Upper Extremity Assessment: RUE deficits/detail;LUE deficits/detail RUE Deficits / Details: s/p stent, limited MMT but denies deficits LUE Deficits / Details: L clavicular fx limiting AROM 2/2 pain LUE: Unable to fully assess due to pain;Shoulder pain with ROM   Lower Extremity Assessment Lower Extremity Assessment: Overall WFL for tasks assessed       Communication Communication Communication: No difficulties   Cognition Arousal/Alertness: Awake/alert Behavior During Therapy: WFL for tasks assessed/performed Overall Cognitive Status: Within Functional Limits for tasks assessed                                       General Comments       Exercises Other Exercises Other Exercises: Pt instructed in PROM vs AAROM vs AROM for LUE per ortho surgeon and pt able to return demo gross full PROM, ~75% AAROM in L shoulder ROM. Other Exercises: Pt instructed in energy conservation strategies to support safety and indep with ADL while minimizing over exertion and SOB, including activity pacing, work simplification, AE/DME.   Shoulder Instructions      Home Living Family/patient expects to be discharged to:: Private residence Living Arrangements: Spouse/significant other Available Help at Discharge: Family;Available 24 hours/day   Home Access: Stairs to enter [employer] of Steps: 3 Entrance Stairs-Rails: Can reach both Home Layout: One  level     Bathroom Shower/Tub: Occupational psychologist: Handicapped height     Home Equipment: Grab bars - tub/shower          Prior Functioning/Environment Prior Level of Function : Independent/Modified Independent             Mobility Comments: Prior to ~2 weeks ago pt was driving, active, independent          OT Problem List: Decreased strength;Pain;Cardiopulmonary status limiting activity;Decreased range of motion;Decreased knowledge of use of DME or AE;Impaired UE functional use;Decreased knowledge of precautions      OT Treatment/Interventions: Self-care/ADL training;Therapeutic exercise;Therapeutic activities;DME and/or AE instruction;Patient/family education    OT Goals(Current goals can be found in the care plan section) Acute Rehab OT Goals Patient Stated Goal: go home, not rehab OT Goal Formulation: With patient Time For Goal Achievement: 10/17/21 Potential to Achieve Goals: Good ADL Goals Pt Will  Perform Upper Body Dressing: with modified independence;sitting Pt Will Perform Lower Body Dressing: with modified independence;sit to/from stand Pt Will Transfer to Toilet: with modified independence;ambulating (elevated commode,LRAD) Pt Will Perform Toileting - Clothing Manipulation and hygiene: with modified independence Additional ADL Goal #1: Pt will complete all aspects of bathing, primarily from seated position, AE PRN, and with SpO2 >90% on room air, 2/2 opportunities.  OT Frequency: Min 2X/week    Co-evaluation              AM-PAC OT "6 Clicks" Daily Activity     Outcome Measure Help from another person eating meals?: None Help from another person taking care of personal grooming?: A Little Help from another person toileting, which includes using toliet, bedpan, or urinal?: A Little Help from another person bathing (including washing, rinsing, drying)?: A Lot Help from another person to put on and taking off regular upper body clothing?:  A Lot Help from another person to put on and taking off regular lower body clothing?: A Lot 6 Click Score: 16   End of Session    Activity Tolerance: Patient limited by pain Patient left: in bed;with call bell/phone within reach  OT Visit Diagnosis: Other abnormalities of gait and mobility (R26.89);History of falling (Z91.81);Pain Pain - Right/Left: Left Pain - part of body: Shoulder (across back)                Time: 9357-0177 OT Time Calculation (min): 29 min Charges:  OT General Charges $OT Visit: 1 Visit OT Evaluation $OT Eval Moderate Complexity: 1 Mod OT Treatments $Self Care/Home Management : 8-22 mins  Ardeth Perfect., MPH, MS, OTR/L ascom [phone] 10/03/21, 4:47 PM

## 2021-10-04 DIAGNOSIS — J189 Pneumonia, unspecified organism: Secondary | ICD-10-CM | POA: Diagnosis not present

## 2021-10-04 DIAGNOSIS — I480 Paroxysmal atrial fibrillation: Secondary | ICD-10-CM | POA: Diagnosis not present

## 2021-10-04 DIAGNOSIS — I5181 Takotsubo syndrome: Secondary | ICD-10-CM | POA: Diagnosis not present

## 2021-10-04 LAB — CULTURE, BLOOD (ROUTINE X 2)
Culture: NO GROWTH
Culture: NO GROWTH
Special Requests: ADEQUATE
Special Requests: ADEQUATE

## 2021-10-04 LAB — BASIC METABOLIC PANEL
Anion gap: 6 (ref 5–15)
BUN: 16 mg/dL (ref 8–23)
CO2: 36 mmol/L — ABNORMAL HIGH (ref 22–32)
Calcium: 9.8 mg/dL (ref 8.9–10.3)
Chloride: 98 mmol/L (ref 98–111)
Creatinine, Ser: 0.79 mg/dL (ref 0.44–1.00)
GFR, Estimated: >60 mL/min (ref >60)
Glucose, Bld: 124 mg/dL — ABNORMAL HIGH (ref 70–99)
Potassium: 4.4 mmol/L (ref 3.5–5.1)
Sodium: 140 mmol/L (ref 135–145)

## 2021-10-04 LAB — CBC
HCT: 34.6 % — ABNORMAL LOW (ref 36.0–46.0)
Hemoglobin: 10.7 g/dL — ABNORMAL LOW (ref 12.0–15.0)
MCH: 29.6 pg (ref 26.0–34.0)
MCHC: 30.9 g/dL (ref 30.0–36.0)
MCV: 95.6 fL (ref 80.0–100.0)
Platelets: 366 10*3/uL (ref 150–400)
RBC: 3.62 MIL/uL — ABNORMAL LOW (ref 3.87–5.11)
RDW: 14.6 % (ref 11.5–15.5)
WBC: 10.3 10*3/uL (ref 4.0–10.5)
nRBC: 0 % (ref 0.0–0.2)

## 2021-10-04 MED ORDER — SALINE SPRAY 0.65 % NA SOLN
1.0000 | NASAL | Status: DC | PRN
Start: 2021-10-04 — End: 2021-10-06
  Filled 2021-10-04: qty 44

## 2021-10-04 NOTE — TOC Initial Note (Addendum)
Transition of Care [facility]) - Initial/Assessment Note    Patient Details  [name] MRN: [id number] Date of Birth: 1955-09-14  Transition of Care [facility]) CM/SW Contact:    Candie Chroman, LCSW Phone Number: 10/04/2021, 11:33 AM  Clinical Narrative:   CSW met with patient. No supports at bedside. CSW introduced role and explained that therapy recommendations would be discussed. She is not interested in SNF placement at this time but is agreeable to home health. No agency preference. Will start search. If unable to obtain home health, she is not interested in outpatient therapy. DME recommendations for RW and 3-in-1. Patient asked about Depends. [employer] only provides these if patient has Medicaid which she does not. During assessment, told patient it's unlikely that [employer] would supply and encouraged her to check pharmacy, [facility], [facility], etc. No further concerns. CSW encouraged patient to contact CSW as needed. CSW will continue to follow patient for support and facilitate return home once stable. Husband will transport her home at discharge.         12:20 pm: Kirsten [name] has accepted referral for PT and OT. Patient and husband are aware.        12:40 pm: Ordered RW and 3-in-1 through [employer].  Expected Discharge Plan: [place] Barriers to Discharge: Continued Medical Work up   Patient Goals and CMS Choice        Expected Discharge Plan and Services Expected Discharge Plan: [place] Acute Care Choice: Home Health, Durable Medical Equipment Living arrangements for the past 2 months: Single Family Home                                      Prior Living Arrangements/Services Living arrangements for the past 2 months: Single Family Home Lives with:: Spouse Patient language and need for interpreter reviewed:: Yes Do you feel safe going back to the place where you live?: Yes      Need for Family Participation in Patient Care: Yes  (Comment) Care giver support system in place?: Yes (comment)   Criminal Activity/Legal Involvement Pertinent to Current Situation/Hospitalization: No - Comment as needed  Activities of Daily Living Home Assistive Devices/Equipment: CPAP ADL Screening (condition at time of admission) Patient's cognitive ability adequate to safely complete daily activities?: Yes Is the patient deaf or have difficulty hearing?: No Does the patient have difficulty seeing, even when wearing glasses/contacts?: No Does the patient have difficulty concentrating, remembering, or making decisions?: No Patient able to express need for assistance with ADLs?: Yes Does the patient have difficulty dressing or bathing?: No Independently performs ADLs?: Yes (appropriate for developmental age) Does the patient have difficulty walking or climbing stairs?: Yes Weakness of Legs: Left Weakness of Arms/Hands: Left  Permission Sought/Granted Permission sought to share information with : Facility Art therapist granted to share information with : Yes, Verbal Permission Granted     Permission granted to share info w AGENCY: Home Health Agencies        Emotional Assessment Appearance:: Appears stated age Attitude/Demeanor/Rapport: Engaged, Gracious Affect (typically observed): Accepting, Appropriate, Calm, Pleasant Orientation: : Oriented to Self, Oriented to Place, Oriented to  Time, Oriented to Situation Alcohol / Substance Use: Not Applicable Psych Involvement: No (comment)  Admission diagnosis:  Healthcare-associated pneumonia [J18.9] Elevated troponin [R77.8] NSTEMI (non-ST elevated myocardial infarction) ([place]) [I21.4] Dyspnea, unspecified type [R06.00] Edema, unspecified  type [R60.9] Patient Active Problem List   Diagnosis Date Noted   Stress-induced cardiomyopathy 10/03/2021   Sepsis due to pneumonia ([place]) 09/29/2021   Dyslipidemia 09/29/2021   Type 2 diabetes mellitus without  complications ([place]) 35/24/8185   Anxiety and depression 09/29/2021   Multiple rib fractures 09/29/2021   Klebsiella cystitis 08/01/2021   Acute cystitis without hematuria 07/20/2021   Need for shingles vaccine 03/22/2021   Need for prophylactic vaccination with combined diphtheria-tetanus-pertussis (DTP) vaccine 12/20/2020   Encounter for general adult medical examination with abnormal findings 12/20/2020   Upper airway cough syndrome 10/28/2020   Spinal stenosis, lumbar region with neurogenic claudication 12/31/2019   DDD (degenerative disc disease), lumbar 08/11/2019   Primary osteoarthritis of right hip 08/11/2019   Urinary urgency 08/10/2019   NASH (nonalcoholic steatohepatitis) 06/02/2019   Diabetes mellitus without complication ([place]) 90/93/1121   Colon cancer screening 12/25/2017   Estrogen deficiency 12/25/2017   Pulmonary fibrosis ([place])    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 03/21/2017   Insomnia w/ sleep apnea 03/21/2017   Class 1 obesity with serious comorbidity and body mass index (BMI) of 34.0 to 34.9 in adult 01/31/2016   Insomnia due to anxiety and fear 10/11/2014   GAD (generalized anxiety disorder) 10/11/2014   Hyperlipidemia with target LDL less than 100 10/22/2012   Asthma, mild persistent 04/06/2011   Vitamin B 12 deficiency 06/28/2009   Hypothyroidism 06/20/2009   Depression with anxiety 06/20/2009   Type II diabetes mellitus with manifestations ([place]) 04/08/2009   Essential hypertension, benign 04/08/2009   OSA on CPAP 04/08/2009   PCP:  Kirsten Lima, MD Pharmacy:   [id number] [place], [place] [facility] AT [address] [place] [address] Phone: [phone] Fax: [phone]     Social Determinants of Health (SDOH) Interventions    Readmission Risk Interventions     No data to display

## 2021-10-04 NOTE — TOC CM/SW Note (Signed)
Patient is not able to walk the distance required to go the bathroom, or he/she is unable to safely negotiate stairs required to access the bathroom.  A 3in1 BSC will alleviate this problem

## 2021-10-04 NOTE — Progress Notes (Signed)
Occupational Therapy Treatment Patient Details [name] MRN: [id number] DOB: 05-01-1955 Today's Date: 10/04/2021   History of present illness 66 y.o. Caucasian female with medical history significant for atrial fibrillation on Eliquis, asthma, GERD, hypertension, dyslipidemia, IBS, OSA on CPAP, type 2 diabetes mellitus and dyslipidemia and obesity.~ 2 weeks prior to this ED arrival pt was visiting family in [place] and fell with subsequent left-sided 8 ribs fracture as well as left clavicular fracture for which she was hospitalized for several days and had apparently had hypercarbic respiratory failure.  Pt was discharged and drove (husband driving) back to [place] where she was home ~12 hours before needing to come to the ED.   OT comments  Pt seen for OT tx. Pt endorses feeling better, up in recliner, and reports just back from the bathroom with staff. On room air, SpO2 90-91%. Pt instructed in AE for LB dressing and was able to return demo with and without AE to simulate donning LB dressing without direct assist. Pt progressing, much less pain limited.    Recommendations for follow up therapy are one component of a multi-disciplinary discharge planning process, led by the attending physician.  Recommendations may be updated based on patient status, additional functional criteria and insurance authorization.    Follow Up Recommendations  Home health OT    Assistance Recommended at Discharge Frequent or constant Supervision/Assistance  Patient can return home with the following  A little help with walking and/or transfers;Assistance with cooking/housework;Assist for transportation;Help with stairs or ramp for entrance;A little help with bathing/dressing/bathroom   Equipment Recommendations  BSC/3in1;Other (comment) (2WW)    Recommendations for Other Services      Precautions / Restrictions Precautions Precautions: Fall Restrictions Weight Bearing Restrictions: No RUE Weight  Bearing:  (post stent, UE access precautions) LUE Weight Bearing: Weight bearing as tolerated (clav fx, WB ok with arm by side (per ortho))       Mobility Bed Mobility               General bed mobility comments: NT, up in recliner    Transfers                   General transfer comment: deferred, pt reports just back from bathroom prior to OT's arrival     Balance                                           ADL either performed or assessed with clinical judgement   ADL                                         General ADL Comments: Pt instructed in AE for LB dressing and to return demo with and without AE to simulate donning LB dressing without direct assist.    Extremity/Trunk Assessment              Vision       Perception     Praxis      Cognition Arousal/Alertness: Awake/alert Behavior During Therapy: WFL for tasks assessed/performed Overall Cognitive Status: Within Functional Limits for tasks assessed  Exercises      Shoulder Instructions       General Comments      Pertinent Vitals/ Pain       Pain Assessment Pain Assessment: 0-10 Pain Score: 3  Pain Location: posterior L ribs Pain Descriptors / Indicators: Aching Pain Intervention(s): Limited activity within patient's tolerance, Monitored during session, Premedicated before session, Repositioned  Home Living                                          Prior Functioning/Environment              Frequency  Min 2X/week        Progress Toward Goals  OT Goals(current goals can now be found in the care plan section)  Progress towards OT goals: Progressing toward goals  Acute Rehab OT Goals Patient Stated Goal: go home, not rehab OT Goal Formulation: With patient Time For Goal Achievement: 10/17/21 Potential to Achieve Goals: Good  Plan Discharge plan needs to  be updated;Frequency remains appropriate    Co-evaluation                 AM-PAC OT "6 Clicks" Daily Activity     Outcome Measure   Help from another person eating meals?: None Help from another person taking care of personal grooming?: A Little Help from another person toileting, which includes using toliet, bedpan, or urinal?: A Little Help from another person bathing (including washing, rinsing, drying)?: A Little Help from another person to put on and taking off regular upper body clothing?: A Little Help from another person to put on and taking off regular lower body clothing?: A Little 6 Click Score: 19    End of Session    OT Visit Diagnosis: Other abnormalities of gait and mobility (R26.89);History of falling (Z91.81);Pain Pain - Right/Left: Left Pain - part of body: Shoulder (across L shoulder blade/ribs)   Activity Tolerance Patient tolerated treatment well   Patient Left in chair;with call bell/phone within reach   Nurse Communication Mobility status        Time: 1105-1120 OT Time Calculation (min): 15 min  Charges: OT General Charges $OT Visit: 1 Visit OT Treatments $Self Care/Home Management : 8-22 mins  Ardeth Perfect., MPH, MS, OTR/L ascom [phone] 10/04/21, 11:34 AM

## 2021-10-04 NOTE — Progress Notes (Signed)
[facility] Cardiology  Patient ID: Kirsten [name] MRN: [id number] DOB/AGE: 1955/12/23 66 y.o.   Admit date: 09/29/2021 Referring Physician [facility] Primary Physician Ronnald Ramp Primary Cardiologist Humphrey Rolls Reason for Consultation elevated troponin   HPI: 66 year old female referred for evaluation of elevated troponin.  Patient has a history of paroxysmal atrial fibrillation, on Eliquis for stroke prevention and amiodarone for rhythm control, followed by Dr. Humphrey Rolls.  She was recently in [place], fell x-ray of multiple ribs clavicle hospitalized several days, discharged yesterday and drove home.  This morning, he patient noted worsening shortness of breath, cough and lower extremity edema and presented to [facility] ED.  ECG revealed sinus rhythm with nonspecific intraventricular conduction delay which did not appear changed compared to most recent ECG.  Lab work was notable for elevated high-sensitivity troponin (397, 1399).  The patient denies chest pain other than chest wall discomfort due to rib fractures.  Other notable lab work included elevated white count of 14,900.  Chest x-ray revealed patchy left basilar atelectasis.  ET scan did not reveal evidence for pulmonary embolus, did show left lower lobe consolidation, possible infection/inflammation, left displaced anterior 5-7 rib fractures, left distal clavicular fracture.  Interval History: -Remains on supplemental oxygen, good effort with physical therapy yesterday -Diuresing very well, net -5.5 L since admission -Feels as though her swelling has much improved in her upper arms and lower legs -Does not feel short of breath, but unable to take a full deep breath due to pain (rib fractures) -No chest pain    Vitals:   10/04/21 0427 10/04/21 0516 10/04/21 0721 10/04/21 0804  BP: 122/65 (!) 118/57 (!) 122/59   Pulse: 64 76 73 73  Resp: 18 16 18 18   Temp: 98 F (36.7 C) 97.9 F (36.6 C) 98 F (36.7 C)   TempSrc: Oral Oral Oral   SpO2: 94% 94% 96% 96%   Weight:      Height:         Intake/Output Summary (Last 24 hours) at 10/04/2021 0844 Last data filed at 10/04/2021 0700 Gross per 24 hour  Intake 3 ml  Output 4200 ml  Net -4197 ml    PHYSICAL EXAM  General: Pleasant ill-appearing Caucasian female.  Sitting upright with legs off hospital bed wrapped in blankets. HEENT:  Normocephalic and atramatic Neck:  No JVD.  Lungs: Normal respiratory effort on oxygen by nasal cannula.  Bibasilar crackles, but overall aeration improving from yesterday Heart: HRRR . Normal S1 and S2 without gallops or murmurs.  Abdomen: Nondistended appearing  Msk:  Normal strength and tone for age. Extremities: Right upper extremity with significant ecchymosis and tenderness from infiltrated IV.  No clubbing, cyanosis.  Trace to 1+ bilateral lower extremity edema, overall improving Neuro: Alert and oriented X 3. Psych:  Good affect, responds appropriately   LABS: Basic Metabolic Panel: Recent Labs    10/03/21 0500 10/04/21 0538  NA 137 140  K 4.2 4.4  CL 101 98  CO2 32 36*  GLUCOSE 138* 124*  BUN 13 16  CREATININE 0.77 0.79  CALCIUM 9.1 9.8    Liver Function Tests: No results for input(s): "AST", "ALT", "ALKPHOS", "BILITOT", "PROT", "ALBUMIN" in the last 72 hours.  No results for input(s): "LIPASE", "AMYLASE" in the last 72 hours. CBC: Recent Labs    10/02/21 0502  WBC 9.2  NEUTROABS 7.4  HGB 9.2*  HCT 29.5*  MCV 94.2  PLT 207    Cardiac Enzymes: No results for input(s): "CKTOTAL", "CKMB", "CKMBINDEX", "TROPONINI" in the  last 72 hours. BNP: Invalid input(s): "POCBNP" D-Dimer: No results for input(s): "DDIMER" in the last 72 hours. Hemoglobin A1C: No results for input(s): "HGBA1C" in the last 72 hours. Fasting Lipid Panel: No results for input(s): "CHOL", "HDL", "LDLCALC", "TRIG", "CHOLHDL", "LDLDIRECT" in the last 72 hours.  Thyroid Function Tests: No results for input(s): "TSH", "T4TOTAL", "T3FREE", "THYROIDAB" in the last  72 hours.  Invalid input(s): "FREET3" Anemia Panel: No results for input(s): "VITAMINB12", "FOLATE", "FERRITIN", "TIBC", "IRON", "RETICCTPCT" in the last 72 hours.  CARDIAC CATHETERIZATION  Result Date: 10/02/2021   Mid LAD lesion is 30% stenosed.   There is mild to moderate left ventricular systolic dysfunction.   LV end diastolic pressure is moderately elevated.   The left ventricular ejection fraction is 35-45% by visual estimate. 1.  Elevated pulmonary capillary wedge pressure, LVEDP consistent with pulmonary edema 2.  Nonobstructive coronary artery disease 3.  Mild to moderate reduced left ventricular function with apical wall akinesis consistent with Takotsubo's cardiomyopathy following recent fall and multiple rib and clavicular fracture Recommendations 1.  Medical therapy 2.  DC heparin 3.  Add metoprolol succinate and losartan 4.  Furosemide 60 mg IV twice daily 5.  Resume Eliquis on 10/03/2021     Echo technically difficult while patient on BiPAP, the function appears normal, with estimated LV ejection fraction 50-55% without evidence for pericardial effusion  TELEMETRY reviewed by me (LT) 8/23: Sinus rhythm first-degree AV block rate 60s to 70s  Data reviewed by me (LT) 8/23: Hospitalist note, BMP, ordered CBC, telemetry, I/os  ASSESSMENT AND PLAN:  Principal Problem:   Stress-induced cardiomyopathy Active Problems:   Hypothyroidism   Essential hypertension, benign   OSA on CPAP   Class 1 obesity with serious comorbidity and body mass index (BMI) of 34.0 to 34.9 in adult   Sepsis due to pneumonia ([place])   Dyslipidemia   Type 2 diabetes mellitus without complications ([place])   Anxiety and depression   Multiple rib fractures    1.  NSTEMI (397, 1399, 1665, 573,), in the absence of chest pain, with nondiagnostic ECG, in the setting of recent rib fractures and clavicular fracture, possible underlying pneumonia, troponin trending down. 2.  Takotsubo cardiomyopathy, LHC with  nonobstructive CAD, LV gram with apical wall akinesis in the setting of significant physiologic stress from fall, multiple rib fractures 3.  Possible hospital-acquired pneumonia following recent hospitalization in [place] for multiple rib and clavicular fracture with chest x-ray and chest CT revealing left basilar consolidation, with elevated white count, seen respiratory status, requiring BiPAP, unclear etiology, the echocardiogram technically difficult but likely shows preserved 2D left ventricular function without pericardial effusion 4.  Paroxysmal atrial fibrillation, on Eliquis for stroke prevention, currently in sinus rhythm   Recommendations   1.  Agree with current therapy 2.  Continue Eliquis 5 mg twice daily, continue aspirin 81 mg daily for now, will likely discontinue this at discharge in favor of Eliquis monotherapy 3.  Continue amiodarone 200 mg once daily for rhythm control in addition to metoprolol XL 12.5 mg once daily, losartan 25 mg once daily for GDMT 4.  Continue 60 mg IV Lasix twice daily for now, diuresing well 5.  Continue atorvastatin 40 mg daily 6.  Hopefully okay for discharge from a cardiac standpoint likely Thursday or Friday after further diuresis if oxygen can be weaned and the patient is ambulating without significant dyspnea.  This patient's plan of care was discussed and created with Dr. Saralyn Pilar and he is in agreement.  Tristan Schroeder, PA-C 10/04/2021 8:44 AM

## 2021-10-04 NOTE — Progress Notes (Addendum)
PROGRESS NOTE    Kirsten [name]  HGD:[id number] DOB: 06-22-1955 DOA: 09/29/2021 PCP: [name], MD    Assessment & Plan:   Principal Problem:   Stress-induced cardiomyopathy Active Problems:   Sepsis due to pneumonia ([place])   Hypothyroidism   Dyslipidemia   Type 2 diabetes mellitus without complications ([place])   Anxiety and depression   Essential hypertension, benign   OSA on CPAP   Class 1 obesity with serious comorbidity and body mass index (BMI) of 34.0 to 34.9 in adult   Multiple rib fractures  Assessment and Plan: Stress-induced/takotsubo cardiomyopathy: non-obstructive CAD seen on 8/21 LHC, with apical wall akinesis consistent w/ stress cardiomyopathy, estimated ef 35-45.  Significant physiologic stress from rib fractures. Troponin peak 1400 & down trending. IV heparin d/c. Continue on metoprolol & aspirin. Cardio following and recs apprec   Acute hypoxic respiratory failure: likely secondary to rib fractures, cardiomyopathy, hcap, and volume overload. Rapid response 8/19 for volume overload, treated with bipap and lasix, promptly improved with that. Continue on lasix. Continue on supplemental oxygen and wean as tolerated   HCAP: continue on cefadroxil, bronchodilators & encourage incentive spirometry   Sepsis: met criteria w/ leukocytosis, tachypnea & HCAP. Continue on abxs    Loose stool: likely secondary to abxs   Rib fractures & Clavicle fracture: multiple rib fractures on left side also left clavicle fracture. F/u w/ ortho surg in 2 weeks    PAF: continue on amio, metoprolol, eliquis.   Hypothyroidism: continue on levothyroxine    HTN: continue on metoprolol, amio   Major depression: severity unknown. Continue on home dose of paroxetine    DM2: fairly well controlled, HbA1c 6.8.   OSA: CPAP qhs    RLE laceration: suture repaired 2 weeks ago, sutures removed 8/19. Some erythema likely reactive. Continue w/ supportive care      DVT prophylaxis:  eliquis Code Status: full  Family Communication:  Disposition Plan: likely d/c back home w/ HH.  Level of care: Progressive  Status is: Inpatient Remains inpatient appropriate because: severity of illness    Consultants:  Cardio   Procedures:   Antimicrobials: cefadroxil   Subjective: Pt c/o malaise  Objective: Vitals:   10/04/21 0427 10/04/21 0516 10/04/21 0721 10/04/21 0804  BP: 122/65 (!) 118/57 (!) 122/59   Pulse: 64 76 73 73  Resp: _0 Temp: 98 F (36.7 C) 97.9 F (36.6 C) 98 F (36.7 C)   TempSrc: Oral Oral Oral   SpO2: 94% 94% 96% 96%  Weight:      Height:        Intake/Output Summary (Last 24 hours) at 10/04/2021 0906 Last data filed at 10/04/2021 0700 Gross per 24 hour  Intake 3 ml  Output 4200 ml  Net -4197 ml   Filed Weights   09/29/21 1010 10/03/21 0511  Weight: 87.1 kg 90.9 kg    Examination:  General exam: Appears calm and comfortable  Respiratory system: Clear to auscultation. Respiratory effort normal. Cardiovascular system: S1 & S2 +. No rubs, gallops or clicks.  Gastrointestinal system: Abdomen is nondistended, soft and nontender. Normal bowel sounds heard. Central nervous system: Alert and oriented. Moves all extremities  Psychiatry: Judgement and insight appear normal. Flat mood and affect    Data Reviewed: I have personally reviewed following labs and imaging studies  CBC: Recent Labs  Lab 09/29/21 1012 09/29/21 1521 09/30/21 0431 10/01/21 0826 10/02/21 0502  WBC 14.9* 10.8* 15.0* 15.8* 9.2  NEUTROABS  --  8.4*  --   --  7.4  HGB 13.0 11.3* 10.9* 9.7* 9.2*  HCT 41.4 35.5* 34.6* 30.7* 29.5*  MCV 91.4 91.7 93.5 93.3 94.2  PLT 362 298 294 215 622   Basic Metabolic Panel: Recent Labs  Lab 09/30/21 1100 09/30/21 1934 10/01/21 0529 10/02/21 0502 10/03/21 0500 10/04/21 0538  NA 138  --  137 139 137 140  K 3.4* 4.1 3.6 3.3* 4.2 4.4  CL 100  --  101 103 101 98  CO2 23  --  28 30 32 36*  GLUCOSE 162*  --   119* 123* 138* 124*  BUN 8  --  _0 CREATININE 0.80  --  0.87 0.86 0.77 0.79  CALCIUM 9.4  --  9.0 8.8* 9.1 9.8  MG 1.8  --   --   --   --   --    GFR: Estimated Creatinine Clearance: 82.2 mL/min (by C-G formula based on SCr of 0.79 mg/dL). Liver Function Tests: Recent Labs  Lab 09/29/21 1012 09/29/21 1521  AST 60* 51*  ALT 60* 51*  ALKPHOS 101 79  BILITOT 1.0 0.9  PROT 7.2 5.8*  ALBUMIN 3.9 3.0*   No results for input(s): "LIPASE", "AMYLASE" in the last 168 hours. No results for input(s): "AMMONIA" in the last 168 hours. Coagulation Profile: Recent Labs  Lab 09/29/21 1521  INR 1.4*   Cardiac Enzymes: No results for input(s): "CKTOTAL", "CKMB", "CKMBINDEX", "TROPONINI" in the last 168 hours. BNP (last 3 results) No results for input(s): "PROBNP" in the last 8760 hours. HbA1C: No results for input(s): "HGBA1C" in the last 72 hours. CBG: Recent Labs  Lab 09/30/21 0823 09/30/21 1100  GLUCAP 159* 140*   Lipid Profile: No results for input(s): "CHOL", "HDL", "LDLCALC", "TRIG", "CHOLHDL", "LDLDIRECT" in the last 72 hours. Thyroid Function Tests: No results for input(s): "TSH", "T4TOTAL", "FREET4", "T3FREE", "THYROIDAB" in the last 72 hours. Anemia Panel: No results for input(s): "VITAMINB12", "FOLATE", "FERRITIN", "TIBC", "IRON", "RETICCTPCT" in the last 72 hours. Sepsis Labs: Recent Labs  Lab 09/29/21 1236 09/29/21 1521 09/29/21 2105 10/01/21 0826  PROCALCITON <0.10  --   --   --   LATICACIDVEN  --  0.8 1.0 1.0    Recent Results (from the past 240 hour(s))  Culture, blood (x 2)     Status: None   Collection Time: 09/29/21  7:00 PM   Specimen: BLOOD  Result Value Ref Range Status   Specimen Description BLOOD LEFT FA  Final   Special Requests   Final    BOTTLES DRAWN AEROBIC AND ANAEROBIC Blood Culture adequate volume   Culture   Final    NO GROWTH 5 DAYS Performed at [facility], [address] [place], [place] [address]    Report  Status 10/04/2021 FINAL  Final  Culture, blood (x 2)     Status: None   Collection Time: 09/29/21  9:05 PM   Specimen: BLOOD  Result Value Ref Range Status   Specimen Description BLOOD BLOOD LEFT FOREARM  Final   Special Requests   Final    BOTTLES DRAWN AEROBIC ONLY Blood Culture adequate volume   Culture   Final    NO GROWTH 5 DAYS Performed at [facility], [address], [place], [place] [address]    Report Status 10/04/2021 FINAL  Final  Group A Strep by PCR     Status: None   Collection Time: 10/01/21  7:59 AM   Specimen: Throat; Sterile  Swab  Result Value Ref Range Status   Group A Strep by PCR NOT DETECTED NOT DETECTED Final    Comment: Performed at [facility], [address] [place], [place] [address]  SARS Coronavirus 2 by RT PCR (hospital order, performed in [facility] hospital lab) *cepheid single result test* Throat     Status: None   Collection Time: 10/01/21  9:17 AM   Specimen: Throat; Nasal Swab  Result Value Ref Range Status   SARS Coronavirus 2 by RT PCR NEGATIVE NEGATIVE Final    Comment: (NOTE) SARS-CoV-2 target nucleic acids are NOT DETECTED.  The SARS-CoV-2 RNA is generally detectable in upper and lower respiratory specimens during the acute phase of infection. The lowest concentration of SARS-CoV-2 viral copies this assay can detect is 250 copies / mL. A negative result does not preclude SARS-CoV-2 infection and should not be used as the sole basis for treatment or other patient management decisions.  A negative result may occur with improper specimen collection / handling, submission of specimen other than nasopharyngeal swab, presence of viral mutation(s) within the areas targeted by this assay, and inadequate number of viral copies (<250 copies / mL). A negative result must be combined with clinical observations, patient history, and epidemiological information.  Fact Sheet for Patients:    https://www.patel.info/  Fact Sheet for Healthcare Providers: https://hall.com/  This test is not yet approved or  cleared by the [place] FDA and has been authorized for detection and/or diagnosis of SARS-CoV-2 by FDA under an Emergency Use Authorization (EUA).  This EUA will remain in effect (meaning this test can be used) for the duration of the COVID-19 declaration under Section 564(b)(1) of the Act, 21 U.S.C. section 360bbb-3(b)(1), unless the authorization is terminated or revoked sooner.  Performed at [facility], [address], [place], [place] [address]          Radiology Studies: CARDIAC CATHETERIZATION  Result Date: 10/02/2021   Mid LAD lesion is 30% stenosed.   There is mild to moderate left ventricular systolic dysfunction.   LV end diastolic pressure is moderately elevated.   The left ventricular ejection fraction is 35-45% by visual estimate. 1.  Elevated pulmonary capillary wedge pressure, LVEDP consistent with pulmonary edema 2.  Nonobstructive coronary artery disease 3.  Mild to moderate reduced left ventricular function with apical wall akinesis consistent with Takotsubo's cardiomyopathy following recent fall and multiple rib and clavicular fracture Recommendations 1.  Medical therapy 2.  DC heparin 3.  Add metoprolol succinate and losartan 4.  Furosemide 60 mg IV twice daily 5.  Resume Eliquis on 10/03/2021        Scheduled Meds:  amiodarone  200 mg Oral Daily   apixaban  5 mg Oral BID   aspirin EC  81 mg Oral Daily   atorvastatin  40 mg Oral Daily   cefadroxil  500 mg Oral BID   furosemide  60 mg Intravenous Q12H   ipratropium-albuterol  3 mL Nebulization Q6H   levothyroxine  175 mcg Oral Daily   losartan  25 mg Oral Daily   metoprolol succinate  12.5 mg Oral Daily   PARoxetine  25 mg Oral Daily   polyethylene glycol  17 g Oral Daily   senna  1 tablet Oral Daily   sodium chloride flush  3 mL  Intravenous Q12H   sodium chloride flush  3 mL Intravenous Q12H   Continuous Infusions:  sodium chloride       LOS: 5 days  Time spent: 33 mins     Wyvonnia Dusky, MD [facility] Pager [phone]  If 7PM-7AM, please contact night-coverage [email or web address] 10/04/2021, 9:06 AM

## 2021-10-05 DIAGNOSIS — I480 Paroxysmal atrial fibrillation: Secondary | ICD-10-CM | POA: Diagnosis not present

## 2021-10-05 DIAGNOSIS — J189 Pneumonia, unspecified organism: Secondary | ICD-10-CM | POA: Diagnosis not present

## 2021-10-05 DIAGNOSIS — I5181 Takotsubo syndrome: Secondary | ICD-10-CM | POA: Diagnosis not present

## 2021-10-05 LAB — BASIC METABOLIC PANEL
Anion gap: 7 (ref 5–15)
BUN: 12 mg/dL (ref 8–23)
CO2: 37 mmol/L — ABNORMAL HIGH (ref 22–32)
Calcium: 9.5 mg/dL (ref 8.9–10.3)
Chloride: 94 mmol/L — ABNORMAL LOW (ref 98–111)
Creatinine, Ser: 0.72 mg/dL (ref 0.44–1.00)
GFR, Estimated: >60 mL/min (ref >60)
Glucose, Bld: 138 mg/dL — ABNORMAL HIGH (ref 70–99)
Potassium: 4.2 mmol/L (ref 3.5–5.1)
Sodium: 138 mmol/L (ref 135–145)

## 2021-10-05 LAB — CBC
HCT: 33 % — ABNORMAL LOW (ref 36.0–46.0)
Hemoglobin: 10.2 g/dL — ABNORMAL LOW (ref 12.0–15.0)
MCH: 29.7 pg (ref 26.0–34.0)
MCHC: 30.9 g/dL (ref 30.0–36.0)
MCV: 95.9 fL (ref 80.0–100.0)
Platelets: 308 10*3/uL (ref 150–400)
RBC: 3.44 MIL/uL — ABNORMAL LOW (ref 3.87–5.11)
RDW: 14.6 % (ref 11.5–15.5)
WBC: 8 10*3/uL (ref 4.0–10.5)
nRBC: 0 % (ref 0.0–0.2)

## 2021-10-05 MED ORDER — IPRATROPIUM-ALBUTEROL 0.5-2.5 (3) MG/3ML IN SOLN
3.0000 mL | Freq: Two times a day (BID) | RESPIRATORY_TRACT | Status: DC
  Administered 2021-10-05 – 2021-10-06 (×2): 3 mL via RESPIRATORY_TRACT
  Filled 2021-10-05 (×2): qty 3

## 2021-10-05 NOTE — Progress Notes (Signed)
[facility] Cardiology  Patient ID: JAYCEY GENS MRN: [id number] DOB/AGE: 64-Sep-1957 64 y.o.   Admit date: 09/29/2021 Referring Physician [facility] Primary Physician Ronnald Ramp Primary Cardiologist Humphrey Rolls Reason for Consultation elevated troponin   HPI: 66 year old female referred for evaluation of elevated troponin.  Patient has a history of paroxysmal atrial fibrillation, on Eliquis for stroke prevention and amiodarone for rhythm control, followed by Dr. Humphrey Rolls.  She was recently in [place], fell x-ray of multiple ribs clavicle hospitalized several days, discharged yesterday and drove home.  This morning, he patient noted worsening shortness of breath, cough and lower extremity edema and presented to [facility] ED.  ECG revealed sinus rhythm with nonspecific intraventricular conduction delay which did not appear changed compared to most recent ECG.  Lab work was notable for elevated high-sensitivity troponin (397, 1399).  The patient denies chest pain other than chest wall discomfort due to rib fractures.  Other notable lab work included elevated white count of 14,900.  Chest x-ray revealed patchy left basilar atelectasis.  CT scan did not reveal evidence for pulmonary embolus, did show left lower lobe consolidation, possible infection/inflammation, left displaced anterior 5-7 rib fractures, left distal clavicular fracture.  Interval History: -No acute events -Continues to feel better -Diuresing well, remains on 1 L by nasal cannula  Vitals:   10/05/21 0211 10/05/21 0344 10/05/21 0738 10/05/21 0821  BP:  (!) 125/50 (!) 115/55   Pulse:  74 72   Resp:  18 16   Temp:  97.9 F (36.6 C) 98 F (36.7 C)   TempSrc:   Oral   SpO2: 93% 95% 97% 95%  Weight:      Height:         Intake/Output Summary (Last 24 hours) at 10/05/2021 0834 Last data filed at 10/05/2021 0700 Gross per 24 hour  Intake 480 ml  Output 1750 ml  Net -1270 ml    PHYSICAL EXAM  General: Pleasant ill-appearing Caucasian female.  Sitting  upright with legs off hospital bed wrapped in blankets. HEENT:  Normocephalic and atramatic Neck:  No JVD.  Lungs: Normal respiratory effort on oxygen by nasal cannula.  Bibasilar crackles, but overall aeration improving from yesterday Heart: HRRR . Normal S1 and S2 without gallops or murmurs.  Abdomen: Nondistended appearing  Msk:  Normal strength and tone for age. Extremities: Right upper extremity with significant ecchymosis and tenderness from infiltrated IV.  No clubbing, cyanosis.  Trace to 1+ bilateral lower extremity edema, overall improving Neuro: Alert and oriented X 3. Psych:  Good affect, responds appropriately   LABS: Basic Metabolic Panel: Recent Labs    10/04/21 0538 10/05/21 0431  NA 140 138  K 4.4 4.2  CL 98 94*  CO2 36* 37*  GLUCOSE 124* 138*  BUN 16 12  CREATININE 0.79 0.72  CALCIUM 9.8 9.5    Liver Function Tests: No results for input(s): "AST", "ALT", "ALKPHOS", "BILITOT", "PROT", "ALBUMIN" in the last 72 hours.  No results for input(s): "LIPASE", "AMYLASE" in the last 72 hours. CBC: Recent Labs    10/04/21 0927 10/05/21 0431  WBC 10.3 8.0  HGB 10.7* 10.2*  HCT 34.6* 33.0*  MCV 95.6 95.9  PLT 366 308    Cardiac Enzymes: No results for input(s): "CKTOTAL", "CKMB", "CKMBINDEX", "TROPONINI" in the last 72 hours. BNP: Invalid input(s): "POCBNP" D-Dimer: No results for input(s): "DDIMER" in the last 72 hours. Hemoglobin A1C: No results for input(s): "HGBA1C" in the last 72 hours. Fasting Lipid Panel: No results for input(s): "CHOL", "HDL", "LDLCALC", "  TRIG", "CHOLHDL", "LDLDIRECT" in the last 72 hours.  Thyroid Function Tests: No results for input(s): "TSH", "T4TOTAL", "T3FREE", "THYROIDAB" in the last 72 hours.  Invalid input(s): "FREET3" Anemia Panel: No results for input(s): "VITAMINB12", "FOLATE", "FERRITIN", "TIBC", "IRON", "RETICCTPCT" in the last 72 hours.  No results found.   Echo technically difficult while patient on BiPAP,  the function appears normal, with estimated LV ejection fraction 50-55% without evidence for pericardial effusion  TELEMETRY reviewed by me (LT) 8/24: Sinus rhythm first-degree AV block rate 60s to 70s  Data reviewed by me (LT) 8/2: Hospitalist note, BMP, CBC, telemetry, I/os, discussed with hospitalist  ASSESSMENT AND PLAN:  Principal Problem:   Stress-induced cardiomyopathy Active Problems:   Hypothyroidism   Essential hypertension, benign   OSA on CPAP   Class 1 obesity with serious comorbidity and body mass index (BMI) of 34.0 to 34.9 in adult   Sepsis due to pneumonia ([place])   Dyslipidemia   Type 2 diabetes mellitus without complications ([place])   Anxiety and depression   Multiple rib fractures    1.  NSTEMI (397, 1399, 1665, 573,), in the absence of chest pain, with nondiagnostic ECG, in the setting of recent rib fractures and clavicular fracture, possible underlying pneumonia, troponin trending down. 2.  Takotsubo cardiomyopathy, LHC with nonobstructive CAD, LV gram with apical wall akinesis in the setting of significant physiologic stress from fall, multiple rib fractures 3.  Possible hospital-acquired pneumonia following recent hospitalization in [place] for multiple rib and clavicular fracture with chest x-ray and chest CT revealing left basilar consolidation, with elevated white count, seen respiratory status, requiring BiPAP, unclear etiology, the echocardiogram technically difficult but likely shows preserved 2D left ventricular function without pericardial effusion 4.  Paroxysmal atrial fibrillation, on Eliquis for stroke prevention, currently in sinus rhythm   Recommendations   1.  Agree with current therapy 2.  Continue Eliquis 5 mg twice daily, continue aspirin 81 mg daily for now, will likely discontinue this at discharge in favor of Eliquis monotherapy 3.  Continue amiodarone 200 mg once daily for rhythm control in addition to metoprolol XL 12.5 mg once daily, losartan  25 mg once daily for GDMT 4.  Continue 60 mg IV Lasix twice daily for now, diuresing well 5.  Continue atorvastatin 40 mg daily 6.  Hopefully okay for discharge from a cardiac standpoint likely Friday morning after further diuresis if oxygen can be weaned and the patient is ambulating without significant dyspnea.  This patient's plan of care was discussed and created with Dr. Saralyn Pilar and he is in agreement.    Tristan Schroeder, PA-C 10/05/2021 8:34 AM

## 2021-10-05 NOTE — Care Management Important Message (Signed)
Important Message  Patient Details  [name] MRN: [id number] Date of Birth: 1955-05-12   Medicare Important Message Given:  Yes     Dannette Barbara 10/05/2021, 12:55 PM

## 2021-10-05 NOTE — Progress Notes (Signed)
Pt has been administered her night medications. Bed bath completed. IV flushed. Pt took night meds whole with water. Pt received IV morphine for pain 6/10.   She also requested Trazodone for sleep. Administered as ordered. Requested Xanax for some anxiety regarding potential discharge tomorrow. Administered as ordered.  Bed pads changed. Gown changed. Foley and Peri care. External cath emptied.

## 2021-10-05 NOTE — Progress Notes (Addendum)
PROGRESS NOTE    Kirsten [name]  WPY:[id number] DOB: [birth year] DOA: 09/29/2021 PCP: [name], MD    Assessment & Plan:   Principal Problem:   Stress-induced cardiomyopathy Active Problems:   Sepsis due to pneumonia ([place])   Hypothyroidism   Dyslipidemia   Type 2 diabetes mellitus without complications ([place])   Anxiety and depression   Essential hypertension, benign   OSA on CPAP   Class 1 obesity with serious comorbidity and body mass index (BMI) of 34.0 to 34.9 in adult   Multiple rib fractures  Assessment and Plan: Stress-induced/takotsubo cardiomyopathy: non-obstructive CAD seen on 8/21 LHC, with apical wall akinesis consistent w/ stress cardiomyopathy, estimated ef 35-45.  Significant physiologic stress from rib fractures. Troponin peak 1400 & down trending. IV heparin d/c. Continue on metoprolol, amio.  Cardio following and recs apprec   Acute hypoxic respiratory failure: likely secondary to rib fractures, cardiomyopathy, hcap, and volume overload. Rapid response 8/19 for volume overload, treated with bipap and lasix, promptly improved with that. Continue on lasix. Weaned off of supplemental oxygen today but had to placed back on it b/c pt desaturated into the 80s    HCAP: continue on cefadroxil, bronchodilators & encourage incentive spirometry   Sepsis: met criteria w/ leukocytosis, tachypnea & HCAP. Continue on abxs. Sepsis resolved    Loose stool: likely secondary to abxs   Rib fractures & Clavicle fracture: multiple rib fractures on left side also left clavicle fracture. F/u w/ ortho surg in 2 weeks   Normocytic anemia: H&H are stable. No need for a transfusion currently    PAF: continue on eliquis, metoprolol, amiodarone   Hypothyroidism: continue on levothyroxine    HTN: continue on amiodarone, metoprolol    Major depression: severity unknown. Continue on home dose of paroxetine    DM2: HbA1c 6.8, fairly well controlled   OSA: CPAP qhs    RLE  laceration: suture repaired 2 weeks ago, sutures removed 8/19. Some erythema likely reactive. Continue w/ supportive care      DVT prophylaxis: eliquis Code Status: full  Family Communication:  Disposition Plan: likely d/c back home w/ HH.  Level of care: Progressive  Status is: Inpatient Remains inpatient appropriate because: severity of illness    Consultants:  Cardio   Procedures:   Antimicrobials: cefadroxil   Subjective: Pt c/o fatigue   Objective: Vitals:   10/05/21 0211 10/05/21 0344 10/05/21 0738 10/05/21 0821  BP:  (!) 125/50 (!) 115/55   Pulse:  74 72   Resp:  18 16   Temp:  97.9 F (36.6 C) 98 F (36.7 C)   TempSrc:   Oral   SpO2: 93% 95% 97% 95%  Weight:      Height:        Intake/Output Summary (Last 24 hours) at 10/05/2021 0833 Last data filed at 10/05/2021 0700 Gross per 24 hour  Intake 480 ml  Output 1750 ml  Net -1270 ml   Filed Weights   09/29/21 1010 10/03/21 0511  Weight: 87.1 kg 90.9 kg    Examination:  General exam: Appears comfortable  Respiratory system: decreased breath sounds b/l  Cardiovascular system: S1/S2+. No rubs or clicks  Gastrointestinal system: Abd is soft, NT, obese & hypoactive bowel sounds  Central nervous system: alert and oriented. Moves all extremities  Psychiatry: Judgement and insight appears normal. Flat mood and affect     Data Reviewed: I have personally reviewed following labs and imaging studies  CBC: Recent Labs  Lab 09/29/21 1521  09/30/21 0431 10/01/21 0826 10/02/21 0502 10/04/21 0927 10/05/21 0431  WBC 10.8* 15.0* 15.8* 9.2 10.3 8.0  NEUTROABS 8.4*  --   --  7.4  --   --   HGB 11.3* 10.9* 9.7* 9.2* 10.7* 10.2*  HCT 35.5* 34.6* 30.7* 29.5* 34.6* 33.0*  MCV 91.7 93.5 93.3 94.2 95.6 95.9  PLT 298 294 215 207 366 314   Basic Metabolic Panel: Recent Labs  Lab 09/30/21 1100 09/30/21 1934 10/01/21 0529 10/02/21 0502 10/03/21 0500 10/04/21 0538 10/05/21 0431  NA 138  --  137 139  137 140 138  K 3.4*   < > 3.6 3.3* 4.2 4.4 4.2  CL 100  --  101 103 101 98 94*  CO2 23  --  28 30 32 36* 37*  GLUCOSE 162*  --  119* 123* 138* 124* 138*  BUN 8  --  14 17 13 16 12   CREATININE 0.80  --  0.87 0.86 0.77 0.79 0.72  CALCIUM 9.4  --  9.0 8.8* 9.1 9.8 9.5  MG 1.8  --   --   --   --   --   --    < > = values in this interval not displayed.   GFR: Estimated Creatinine Clearance: 82.2 mL/min (by C-G formula based on SCr of 0.72 mg/dL). Liver Function Tests: Recent Labs  Lab 09/29/21 1012 09/29/21 1521  AST 60* 51*  ALT 60* 51*  ALKPHOS 101 79  BILITOT 1.0 0.9  PROT 7.2 5.8*  ALBUMIN 3.9 3.0*   No results for input(s): "LIPASE", "AMYLASE" in the last 168 hours. No results for input(s): "AMMONIA" in the last 168 hours. Coagulation Profile: Recent Labs  Lab 09/29/21 1521  INR 1.4*   Cardiac Enzymes: No results for input(s): "CKTOTAL", "CKMB", "CKMBINDEX", "TROPONINI" in the last 168 hours. BNP (last 3 results) No results for input(s): "PROBNP" in the last 8760 hours. HbA1C: No results for input(s): "HGBA1C" in the last 72 hours. CBG: Recent Labs  Lab 09/30/21 0823 09/30/21 1100  GLUCAP 159* 140*   Lipid Profile: No results for input(s): "CHOL", "HDL", "LDLCALC", "TRIG", "CHOLHDL", "LDLDIRECT" in the last 72 hours. Thyroid Function Tests: No results for input(s): "TSH", "T4TOTAL", "FREET4", "T3FREE", "THYROIDAB" in the last 72 hours. Anemia Panel: No results for input(s): "VITAMINB12", "FOLATE", "FERRITIN", "TIBC", "IRON", "RETICCTPCT" in the last 72 hours. Sepsis Labs: Recent Labs  Lab 09/29/21 1236 09/29/21 1521 09/29/21 2105 10/01/21 0826  PROCALCITON <0.10  --   --   --   LATICACIDVEN  --  0.8 1.0 1.0    Recent Results (from the past 240 hour(s))  Culture, blood (x 2)     Status: None   Collection Time: 09/29/21  7:00 PM   Specimen: BLOOD  Result Value Ref Range Status   Specimen Description BLOOD LEFT FA  Final   Special Requests   Final     BOTTLES DRAWN AEROBIC AND ANAEROBIC Blood Culture adequate volume   Culture   Final    NO GROWTH 5 DAYS Performed at [facility], [address], [place], [place] [address]    Report Status 10/04/2021 FINAL  Final  Culture, blood (x 2)     Status: None   Collection Time: 09/29/21  9:05 PM   Specimen: BLOOD  Result Value Ref Range Status   Specimen Description BLOOD BLOOD LEFT FOREARM  Final   Special Requests   Final    BOTTLES DRAWN AEROBIC ONLY Blood Culture adequate volume  Culture   Final    NO GROWTH 5 DAYS Performed at [facility], [address] [place], [place] [address]    Report Status 10/04/2021 FINAL  Final  Group A Strep by PCR     Status: None   Collection Time: 10/01/21  7:59 AM   Specimen: Throat; Sterile Swab  Result Value Ref Range Status   Group A Strep by PCR NOT DETECTED NOT DETECTED Final    Comment: Performed at [facility], [address] [place], [place] [address]  SARS Coronavirus 2 by RT PCR (hospital order, performed in [facility] hospital lab) *cepheid single result test* Throat     Status: None   Collection Time: 10/01/21  9:17 AM   Specimen: Throat; Nasal Swab  Result Value Ref Range Status   SARS Coronavirus 2 by RT PCR NEGATIVE NEGATIVE Final    Comment: (NOTE) SARS-CoV-2 target nucleic acids are NOT DETECTED.  The SARS-CoV-2 RNA is generally detectable in upper and lower respiratory specimens during the acute phase of infection. The lowest concentration of SARS-CoV-2 viral copies this assay can detect is 250 copies / mL. A negative result does not preclude SARS-CoV-2 infection and should not be used as the sole basis for treatment or other patient management decisions.  A negative result may occur with improper specimen collection / handling, submission of specimen other than nasopharyngeal swab, presence of viral mutation(s) within the areas targeted by this assay, and inadequate number of viral  copies (<250 copies / mL). A negative result must be combined with clinical observations, patient history, and epidemiological information.  Fact Sheet for Patients:   https://www.patel.info/  Fact Sheet for Healthcare Providers: https://hall.com/  This test is not yet approved or  cleared by the [place] FDA and has been authorized for detection and/or diagnosis of SARS-CoV-2 by FDA under an Emergency Use Authorization (EUA).  This EUA will remain in effect (meaning this test can be used) for the duration of the COVID-19 declaration under Section 564(b)(1) of the Act, 21 U.S.C. section 360bbb-3(b)(1), unless the authorization is terminated or revoked sooner.  Performed at [facility], [address], [place], [place] [address]          Radiology Studies: No results found.      Scheduled Meds:  amiodarone  200 mg Oral Daily   apixaban  5 mg Oral BID   aspirin EC  81 mg Oral Daily   atorvastatin  40 mg Oral Daily   cefadroxil  500 mg Oral BID   furosemide  60 mg Intravenous Q12H   ipratropium-albuterol  3 mL Nebulization Q6H   levothyroxine  175 mcg Oral Daily   losartan  25 mg Oral Daily   metoprolol succinate  12.5 mg Oral Daily   PARoxetine  25 mg Oral Daily   polyethylene glycol  17 g Oral Daily   senna  1 tablet Oral Daily   sodium chloride flush  3 mL Intravenous Q12H   sodium chloride flush  3 mL Intravenous Q12H   Continuous Infusions:  sodium chloride       LOS: 6 days    Time spent: 25 mins     Wyvonnia Dusky, MD [facility] Pager [phone]  If 7PM-7AM, please contact night-coverage [email or web address] 10/05/2021, 8:33 AM

## 2021-10-05 NOTE — Progress Notes (Signed)
Patient is alert and oriented this shift. Expresses some pain, some anxiety, and medication to help her sleep.  Trazodone administered and Xanax administered. See flow sheets.

## 2021-10-05 NOTE — TOC Progression Note (Signed)
Transition of Care [facility]) - Progression Note    Patient Details  [name] MRN: [id number] Date of Birth: Jul 13, 1955  [facility] CM/SW [place], LCSW Phone Number: 10/05/2021, 11:30 AM  Clinical Narrative: Patient said she might need home oxygen. [facility] checking on nurse availability.    Expected Discharge Plan: [place] Barriers to Discharge: Continued Medical Work up  Expected Discharge Plan and Services Expected Discharge Plan: [place] Choice: Home Health, Durable Medical Equipment Living arrangements for the past 2 months: Single Family Home                                       Social Determinants of Health (SDOH) Interventions    Readmission Risk Interventions     No data to display

## 2021-10-06 DIAGNOSIS — I480 Paroxysmal atrial fibrillation: Secondary | ICD-10-CM | POA: Diagnosis not present

## 2021-10-06 DIAGNOSIS — I5181 Takotsubo syndrome: Secondary | ICD-10-CM | POA: Diagnosis not present

## 2021-10-06 DIAGNOSIS — I10 Essential (primary) hypertension: Secondary | ICD-10-CM

## 2021-10-06 LAB — BASIC METABOLIC PANEL
Anion gap: 10 (ref 5–15)
BUN: 14 mg/dL (ref 8–23)
CO2: 37 mmol/L — ABNORMAL HIGH (ref 22–32)
Calcium: 10.1 mg/dL (ref 8.9–10.3)
Chloride: 91 mmol/L — ABNORMAL LOW (ref 98–111)
Creatinine, Ser: 0.83 mg/dL (ref 0.44–1.00)
GFR, Estimated: >60 mL/min (ref >60)
Glucose, Bld: 128 mg/dL — ABNORMAL HIGH (ref 70–99)
Potassium: 4.6 mmol/L (ref 3.5–5.1)
Sodium: 138 mmol/L (ref 135–145)

## 2021-10-06 LAB — CBC
HCT: 35.7 % — ABNORMAL LOW (ref 36.0–46.0)
Hemoglobin: 11.1 g/dL — ABNORMAL LOW (ref 12.0–15.0)
MCH: 29.5 pg (ref 26.0–34.0)
MCHC: 31.1 g/dL (ref 30.0–36.0)
MCV: 94.9 fL (ref 80.0–100.0)
Platelets: 313 10*3/uL (ref 150–400)
RBC: 3.76 MIL/uL — ABNORMAL LOW (ref 3.87–5.11)
RDW: 14.6 % (ref 11.5–15.5)
WBC: 8.7 10*3/uL (ref 4.0–10.5)
nRBC: 0 % (ref 0.0–0.2)

## 2021-10-06 LAB — GLUCOSE, RANDOM: Glucose, Bld: 130 mg/dL — ABNORMAL HIGH (ref 70–99)

## 2021-10-06 MED ORDER — METOPROLOL SUCCINATE ER 25 MG PO TB24
12.5000 mg | ORAL_TABLET | Freq: Every day | ORAL | 0 refills | Status: DC
Start: 2021-10-07 — End: 2023-10-01

## 2021-10-06 MED ORDER — FUROSEMIDE 20 MG PO TABS: 20.0000 mg | ORAL_TABLET | Freq: Every day | ORAL | 0 refills | Status: DC

## 2021-10-06 MED ORDER — FUROSEMIDE 20 MG PO TABS: 20.0000 mg | ORAL_TABLET | Freq: Every day | ORAL | Status: DC

## 2021-10-06 MED ORDER — LOSARTAN POTASSIUM 25 MG PO TABS
25.0000 mg | ORAL_TABLET | Freq: Every day | ORAL | 0 refills | Status: DC
Start: 2021-10-07 — End: 2023-06-10

## 2021-10-06 MED ORDER — OXYCODONE HCL 5 MG PO TABS: 5.0000 mg | ORAL_TABLET | Freq: Four times a day (QID) | ORAL | 0 refills | Status: AC | PRN

## 2021-10-06 NOTE — Progress Notes (Signed)
[facility] Cardiology  Patient ID: Kirsten [name] MRN: [id number] DOB/AGE: 64-26-1957 64 y.o.   Admit date: 09/29/2021 Referring Physician [facility] Primary Physician Kirsten [name] Primary Cardiologist Humphrey Rolls Reason for Consultation elevated troponin   HPI: 66 year old female referred for evaluation of elevated troponin.  Patient has a history of paroxysmal atrial fibrillation, on Eliquis for stroke prevention and amiodarone for rhythm control, followed by Dr. Humphrey Rolls.  She was recently in [place], fell x-ray of multiple ribs clavicle hospitalized several days, discharged yesterday and drove home.  This morning, he patient noted worsening shortness of breath, cough and lower extremity edema and presented to [facility] ED.  ECG revealed sinus rhythm with nonspecific intraventricular conduction delay which did not appear changed compared to most recent ECG.  Lab work was notable for elevated high-sensitivity troponin (397, 1399).  The patient denies chest pain other than chest wall discomfort due to rib fractures.  Other notable lab work included elevated white count of 14,900.  Chest x-ray revealed patchy left basilar atelectasis.  CT scan did not reveal evidence for pulmonary embolus, did show left lower lobe consolidation, possible infection/inflammation, left displaced anterior 5-7 rib fractures, left distal clavicular fracture.  Interval History: -Continues to overall feel better -Still has some "twinges" in her chest with movement of her left arm, likely related to her rib/clavicle fractures -On room air this morning, eating breakfast.  Not significantly short of breath, no lower extremity edema  Vitals:   10/05/21 2039 10/05/21 2347 10/06/21 0451 10/06/21 0730  BP:  (!) 116/56 (!) 122/55   Pulse:  72 71   Resp:  18 18   Temp:  98.2 F (36.8 C) 97.8 F (36.6 C)   TempSrc:      SpO2: 95% 97% 95% 93%  Weight:      Height:         Intake/Output Summary (Last 24 hours) at 10/06/2021 0749 Last data filed at  10/05/2021 2348 Gross per 24 hour  Intake 840 ml  Output 800 ml  Net 40 ml    PHYSICAL EXAM  General: Pleasant ill-appearing Caucasian female.  Upright in recliner eating breakfast  HEENT:  Normocephalic and atramatic Neck:  No JVD.  Lungs: Normal respiratory effort on room air.  Trace right basilar crackles, left lung with much improved aeration without appreciable crackles or wheezes Heart: HRRR . Normal S1 and S2 without gallops or murmurs.  Abdomen: Nondistended appearing  Msk:  Normal strength and tone for age. Extremities: Right upper extremity with improving ecchymosis and swelling.  No clubbing, cyanosis.  No lower extremity edema  neuro: Alert and oriented X 3. Psych:  Good affect, responds appropriately   LABS: Basic Metabolic Panel: Recent Labs    10/04/21 0538 10/05/21 0431 10/06/21 0516  NA 140 138  --   K 4.4 4.2  --   CL 98 94*  --   CO2 36* 37*  --   GLUCOSE 124* 138* 130*  BUN 16 12  --   CREATININE 0.79 0.72  --   CALCIUM 9.8 9.5  --     Liver Function Tests: No results for input(s): "AST", "ALT", "ALKPHOS", "BILITOT", "PROT", "ALBUMIN" in the last 72 hours.  No results for input(s): "LIPASE", "AMYLASE" in the last 72 hours. CBC: Recent Labs    10/05/21 0431 10/06/21 0516  WBC 8.0 8.7  HGB 10.2* 11.1*  HCT 33.0* 35.7*  MCV 95.9 94.9  PLT 308 313    Cardiac Enzymes: No results for input(s): "CKTOTAL", "CKMB", "CKMBINDEX", "  TROPONINI" in the last 72 hours. BNP: Invalid input(s): "POCBNP" D-Dimer: No results for input(s): "DDIMER" in the last 72 hours. Hemoglobin A1C: No results for input(s): "HGBA1C" in the last 72 hours. Fasting Lipid Panel: No results for input(s): "CHOL", "HDL", "LDLCALC", "TRIG", "CHOLHDL", "LDLDIRECT" in the last 72 hours.  Thyroid Function Tests: No results for input(s): "TSH", "T4TOTAL", "T3FREE", "THYROIDAB" in the last 72 hours.  Invalid input(s): "FREET3" Anemia Panel: No results for input(s):  "VITAMINB12", "FOLATE", "FERRITIN", "TIBC", "IRON", "RETICCTPCT" in the last 72 hours.  No results found.   Echo technically difficult while patient on BiPAP, the function appears normal, with estimated LV ejection fraction 50-55% without evidence for pericardial effusion  TELEMETRY reviewed by me (LT) 8/25: Sinus rhythm rate 70s to 90s  Data reviewed by me (LT) 8/25: Hospitalist note, ordered BMP, reviewed CBC, telemetry, I/os, discussed with hospitalist  ASSESSMENT AND PLAN:  Principal Problem:   Stress-induced cardiomyopathy Active Problems:   Hypothyroidism   Essential hypertension, benign   OSA on CPAP   Class 1 obesity with serious comorbidity and body mass index (BMI) of 34.0 to 34.9 in adult   Sepsis due to pneumonia ([place])   Dyslipidemia   Type 2 diabetes mellitus without complications ([place])   Anxiety and depression   Multiple rib fractures    1.  NSTEMI (397, 1399, 1665, 573,), in the absence of chest pain, with nondiagnostic ECG, in the setting of recent rib fractures and clavicular fracture, possible underlying pneumonia, troponin trending down. 2.  Takotsubo cardiomyopathy, LHC with nonobstructive CAD, LV gram with apical wall akinesis in the setting of significant physiologic stress from fall, multiple rib fractures 3.  Possible hospital-acquired pneumonia following recent hospitalization in [place] for multiple rib and clavicular fracture with chest x-ray and chest CT revealing left basilar consolidation, with elevated white count, seen respiratory status, requiring BiPAP, unclear etiology, the echocardiogram technically difficult but likely shows preserved 2D left ventricular function without pericardial effusion 4.  Paroxysmal atrial fibrillation, on Eliquis for stroke prevention, currently in sinus rhythm   Recommendations   1.  Agree with current therapy 2.  Continue Eliquis 5 mg twice daily, stop aspirin 81 mg at discharge in favor of Eliquis monotherapy 3.   Continue amiodarone 200 mg once daily for rhythm control in addition to metoprolol XL 12.5 mg once daily, losartan 25 mg once daily for GDMT 4.  S/p 60 mg IV Lasix x7 doses with excellent diuresis.  Will discharge on 20 mg of p.o. Lasix once daily 5.  Continue atorvastatin 40 mg daily 6.  Appears euvolemic, overall okay for discharge from a cardiac standpoint today.  She prefers to transition her cardiology care locally to Dr. Saralyn Pilar. Follow-up in office 1 to 2 weeks.  This patient's plan of care was discussed and created with Dr. Saralyn Pilar and he is in agreement.    Tristan Schroeder, PA-C 10/06/2021 7:49 AM

## 2021-10-06 NOTE — Discharge Summary (Signed)
Physician Discharge Summary  Kirsten [name]:[id number] DOB: May 23, 1955 DOA: 09/29/2021  PCP: [name], MD  Admit date: 09/29/2021 Discharge date: 10/06/2021  Admitted From: home  Disposition:  home w/ home health   Recommendations for Outpatient Follow-up:  Follow up with PCP in 1-2 weeks F/u w/ cardio, Dr. Saralyn Pilar, in 1-2 weeks  F/u w/ ortho surg, Dr. Leim Fabry, in 2 weeks   Home Health: yes Equipment/Devices: 2L [place]  Discharge Condition: stable  CODE STATUS: full  Diet recommendation: Heart Healthy / Carb Modified  Brief/Interim Summary: HPI was taken from Dr. Sidney Ace: Kirsten [name] is a 66 y.o. Caucasian female with medical history significant for atrial fibrillation on Eliquis, asthma, GERD, hypertension, dyslipidemia, IBS, OSA on CPAP, type 2 diabetes mellitus and dyslipidemia and obesity, who presented to the ER  who was in [place] and fell with subsequent left-sided 8 ribs fracture as well as left clavicular fracture for which she was hospitalized for several days and had apparently had hypercarbic respiratory failure that was managed with improvement she was discharged yesterday and drove home.  This morning she was having increasing dyspnea and worsening lower extremity edema with no previous history of CHF.  She admits to left upper back pain that is not different from her pain with ribs and clavicular fracture.  She has been having mild cough with inability to expectorate though she was trying to clear her throat.  She admits to occasional wheezing and has been fatigued and tired.  She felt hot and chilly but did not have any reported fever.  No nausea or vomiting or abdominal pain or diarrhea or melena or bright red bleeding per rectum.  No other bleeding diathesis.  She denies any fever or chills.  She has a healing clean right leg sutured wound.   ED Course: Upon presentation to the ER, BP was 145/69 with initial pulse oximetry of 87 likely inaccurate and later  98% on room air.  Labs revealed mild hypokalemia 3.2 and blood glucose of 170 with AST of 16 ALT of 60 with otherwise unremarkable CMP.  BNP was 105.9 and high sensitive troponin I was 397 and later 1399 EKG as reviewed by me : EKG showed normal sinus rhythm with a rate of 93 with nonspecific intraventricular conduction delay and minimal voltage criteria for LVH with T wave inversion laterally. Imaging: Two-view chest x-ray showed patchy left basal atelectasis/consolidation.   The patient was started on IV heparin drip for suspected MI.  Chest CTA was ordered and is currently pending to rule out PE.  We will start her on hydration with IV normal saline as well as IV antibiotic therapy for suspected underlying sepsis from pneumonia.  She will be admitted to a PCU bed for further evaluation and management.  As per Dr. Jimmye Norman 8/23-8/25/23: Pt presented w/ shortness of breath, elevated troponins and cardio was consulted. Cardiac cath was done which showed nonobstructive CAD w/ apical wall akinesis consistent w/ stress cardiomyopathy,  estimated EF 35-45. Likely physiologic stress from previous recent rib fractures. Pt was treated medically as per cardio. Pt was d/c home on eliquis, amiodarone, metoprolol XL, losartan, atorvastatin as per cardio recs. Also, pt was found to have HCAP and completed an abx course while inpatient. PT/OT evaluated the pt and recommends home health. HH was set up by CM prior to d/c. For more information, please see previous progress/consult notes.    Discharge Diagnoses:  Principal Problem:   Stress-induced cardiomyopathy Active Problems:  Sepsis due to pneumonia ([place])   Hypothyroidism   Dyslipidemia   Type 2 diabetes mellitus without complications (HCC)   Anxiety and depression   Essential hypertension, benign   OSA on CPAP   Class 1 obesity with serious comorbidity and body mass index (BMI) of 34.0 to 34.9 in adult   Multiple rib fractures  Stress-induced/takotsubo  cardiomyopathy: non-obstructive CAD seen on 8/21 LHC, with apical wall akinesis consistent w/ stress cardiomyopathy, estimated ef 35-45.  Significant physiologic stress from rib fractures. Troponin peak 1400 & down trending. IV heparin d/c. Continue on metoprolol, amio.  Cardio following and recs apprec   Acute hypoxic respiratory failure: likely secondary to rib fractures, cardiomyopathy, hcap, and volume overload. Rapid response 8/19 for volume overload, treated with bipap and lasix, promptly improved with that. Continue on lasix. Weaned off of supplemental oxygen today but had to placed back on it b/c pt desaturated into the 80s    HCAP: completed abx course. Continue on  bronchodilators & encourage incentive spirometry   Sepsis: met criteria w/ leukocytosis, tachypnea & HCAP. Completed abxs. Sepsis resolved    Loose stool: likely secondary to abxs   Rib fractures & Clavicle fracture: multiple rib fractures on left side also left clavicle fracture. F/u w/ ortho surg in 2 weeks   Normocytic anemia: H&H are stable. No need for a transfusion currently    PAF: continue on eliquis, metoprolol, amiodarone   Hypothyroidism: continue on levothyroxine    HTN: continue on amiodarone, metoprolol    Major depression: severity unknown. Continue on home dose of paroxetine    DM2: HbA1c 6.8, fairly well controlled   OSA: CPAP qhs    RLE laceration: suture repaired 2 weeks ago, sutures removed 8/19. Some erythema likely reactive. Continue w/ supportive care  Discharge Instructions  Discharge Instructions     Diet - low sodium heart healthy   Complete by: As directed    Discharge instructions   Complete by: As directed    F/u w/ cardio, Dr. Saralyn Pilar, in 1-2 weeks. F/u w/ PCP in 1-2 weeks   Increase activity slowly   Complete by: As directed    No wound care   Complete by: As directed       Allergies as of 10/06/2021       Reactions   Doxycycline Rash   Factive [gemifloxacin  Mesylate] Rash   Crestor [rosuvastatin]    GI upset   Sulfonamide Derivatives    REACTION: rash   Gemifloxacin Rash   Penicillins Hives, Rash   Has patient had a PCN reaction causing immediate rash, facial/tongue/throat swelling, SOB or lightheadedness with hypotension: No Has patient had a PCN reaction causing severe rash involving mucus membranes or skin necrosis: No Has patient had a PCN reaction that required hospitalization No Has patient had a PCN reaction occurring within the last 10 years: No If all of the above answers are "NO", then may proceed with Cephalosporin use.        Medication List     STOP taking these medications    hydrochlorothiazide 25 MG tablet Commonly known as: HYDRODIURIL       TAKE these medications    albuterol 108 (90 Base) MCG/ACT inhaler Commonly known as: VENTOLIN HFA Inhale 2 puffs into the lungs every 6 (six) hours as needed for wheezing or shortness of breath.   amiodarone 200 MG tablet Commonly known as: PACERONE Take 1 tablet (200 mg total) by mouth daily.   apixaban 5 MG Tabs tablet  Commonly known as: Eliquis Take 1 tablet (5 mg total) by mouth 2 (two) times daily.   atorvastatin 40 MG tablet Commonly known as: LIPITOR TAKE ONE TABLET BY MOUTH ONE TIME DAILY   cyanocobalamin 1000 MCG/ML injection Commonly known as: VITAMIN B12 INJECT 1ML INTO THE MUSCLE ONCE A MONTH   FreeStyle Libre 3 Sensor Misc PLACE ONE SENSOR ON THE BACK OF YOUR UPPER ARM EVERY 14 DAYS   furosemide 20 MG tablet Commonly known as: LASIX Take 1 tablet (20 mg total) by mouth daily. Start taking on: October 07, 2021   levothyroxine 175 MCG tablet Commonly known as: SYNTHROID Take 1 tablet (175 mcg total) by mouth daily.   losartan 25 MG tablet Commonly known as: COZAAR Take 1 tablet (25 mg total) by mouth daily. Start taking on: October 07, 2021   metFORMIN 1000 MG tablet Commonly known as: GLUCOPHAGE TAKE ONE TABLET BY MOUTH TWICE A DAY    metoprolol succinate 25 MG 24 hr tablet Commonly known as: TOPROL-XL Take 0.5 tablets (12.5 mg total) by mouth daily. Start taking on: October 07, 2021 What changed:  medication strength how much to take   oxyCODONE 5 MG immediate release tablet Commonly known as: Oxy IR/ROXICODONE Take 1 tablet (5 mg total) by mouth every 6 (six) hours as needed for up to 3 days for moderate pain or severe pain.   PARoxetine 25 MG 24 hr tablet Commonly known as: PAXIL-CR TAKE 1 TABLET(25 MG) BY MOUTH DAILY   zolpidem 10 MG tablet Commonly known as: AMBIEN Take 1 tablet (10 mg total) by mouth at bedtime as needed for sleep.               Durable Medical Equipment  (From admission, onward)           Start     Ordered   10/06/21 1032  For home use only DME oxygen  Once       Question Answer Comment  Length of Need 12 Months   Mode or (Route) Nasal cannula   Liters per Minute 2   Frequency Continuous (stationary and portable oxygen unit needed)   Oxygen conserving device Yes   Oxygen delivery system Gas      10/06/21 1031   10/04/21 1230  For home use only DME 3 n 1  Once        10/04/21 1229   10/04/21 1230  For home use only DME Walker rolling  Once       Question Answer Comment  Walker: With [place] Wheels   Patient needs a walker to treat with the following condition Generalized weakness      10/04/21 [facility] Follow up.   Why: They will follow up with you for your home health needs. Start of care Monday 8/28.        Paraschos, Alexander, MD. Go in 1 week(s).   Specialty: Cardiology Why: Appointment on Thursday, 11/09/2021 at 3:45pm. You have been added to the cancellation list for an earlier appointment. Contact information: [address] [facility] [address] [phone]         Leim Fabry, MD Follow up in 2 week(s).   Specialty: Orthopedic Surgery Contact  information: [place] [address] [phone]                Allergies  Allergen Reactions   Doxycycline Rash   Factive [Gemifloxacin Mesylate] Rash   Crestor [Rosuvastatin]     GI upset   Sulfonamide Derivatives     REACTION: rash   Gemifloxacin Rash   Penicillins Hives and Rash    Has patient had a PCN reaction causing immediate rash, facial/tongue/throat swelling, SOB or lightheadedness with hypotension: No Has patient had a PCN reaction causing severe rash involving mucus membranes or skin necrosis: No Has patient had a PCN reaction that required hospitalization No Has patient had a PCN reaction occurring within the last 10 years: No If all of the above answers are "NO", then may proceed with Cephalosporin use.     Consultations: Cardio  Ortho surg    Procedures/Studies: CARDIAC CATHETERIZATION  Result Date: 10/02/2021   Mid LAD lesion is 30% stenosed.   There is mild to moderate left ventricular systolic dysfunction.   LV end diastolic pressure is moderately elevated.   The left ventricular ejection fraction is 35-45% by visual estimate. 1.  Elevated pulmonary capillary wedge pressure, LVEDP consistent with pulmonary edema 2.  Nonobstructive coronary artery disease 3.  Mild to moderate reduced left ventricular function with apical wall akinesis consistent with Takotsubo's cardiomyopathy following recent fall and multiple rib and clavicular fracture Recommendations 1.  Medical therapy 2.  DC heparin 3.  Add metoprolol succinate and losartan 4.  Furosemide 60 mg IV twice daily 5.  Resume Eliquis on 10/03/2021   ECHOCARDIOGRAM COMPLETE  Result Date: 09/30/2021    ECHOCARDIOGRAM REPORT   Patient Name:   Kirsten [name] Date of Exam: 09/30/2021 Medical Rec #:  [id number]        Height:       67.0 in Accession #:    [id number]       Weight:       192.0 lb Date of Birth:  Jan 31, 1956        BSA:          1.988 m Patient Age:    8 years         BP:            102/58 mmHg Patient Gender: F                HR:           90 bpm. Exam Location:  [facility] Procedure: 2D Echo and Intracardiac Opacification Agent Indications:     NSTEMI I21.4  History:         Patient has prior history of Echocardiogram examinations, most                  recent 12/12/2020.  Sonographer:     Kathlen Brunswick RDCS Referring Phys:  [id number] Melbeta TANG Diagnosing Phys: Isaias Cowman MD  Sonographer Comments: Technically challenging study due to limited acoustic windows, no parasternal window, no apical window and no subcostal window. The patient was on Bipap at the time of this study. There were no acoustic windows obtainable on this study. Definity IV ultrasound imaging agent attempted to enhance endocardial definition. IMPRESSIONS  1. Left ventricular ejection fraction, by estimation, is 55 to 60%. The left ventricle has normal function. The left ventricle has no regional wall motion abnormalities. Left ventricular diastolic parameters are indeterminate.  2. Right ventricular systolic function is normal. The right ventricular size is normal.  3. The mitral valve is normal in structure. No evidence of mitral valve regurgitation. No evidence of mitral stenosis.  4. The aortic valve  is normal in structure. Aortic valve regurgitation is not visualized. No aortic stenosis is present.  5. The inferior vena cava is normal in size with greater than 50% respiratory variability, suggesting right atrial pressure of 3 mmHg. FINDINGS  Left Ventricle: Left ventricular ejection fraction, by estimation, is 55 to 60%. The left ventricle has normal function. The left ventricle has no regional wall motion abnormalities. Definity contrast agent was given IV to delineate the left ventricular  endocardial borders. The left ventricular internal cavity size was normal in size. There is no left ventricular hypertrophy. Left ventricular diastolic parameters are indeterminate. Right Ventricle: The right  ventricular size is normal. No increase in right ventricular wall thickness. Right ventricular systolic function is normal. Left Atrium: Left atrial size was normal in size. Right Atrium: Right atrial size was normal in size. Pericardium: There is no evidence of pericardial effusion. Mitral Valve: The mitral valve is normal in structure. No evidence of mitral valve regurgitation. No evidence of mitral valve stenosis. Tricuspid Valve: The tricuspid valve is normal in structure. Tricuspid valve regurgitation is not demonstrated. No evidence of tricuspid stenosis. Aortic Valve: The aortic valve is normal in structure. Aortic valve regurgitation is not visualized. No aortic stenosis is present. Pulmonic Valve: The pulmonic valve was normal in structure. Pulmonic valve regurgitation is not visualized. No evidence of pulmonic stenosis. Aorta: The aortic root is normal in size and structure. Venous: The inferior vena cava is normal in size with greater than 50% respiratory variability, suggesting right atrial pressure of 3 mmHg. IAS/Shunts: No atrial level shunt detected by color flow Doppler. Isaias Cowman MD Electronically signed by Isaias Cowman MD Signature Date/Time: 09/30/2021/1:23:57 PM    Final    DG Shoulder Left  Result Date: 09/30/2021 CLINICAL DATA:  Left shoulder pain. EXAM: LEFT SHOULDER - 2+ VIEW COMPARISON:  None Available. FINDINGS: Distal left clavicle fracture as detailed under the left clavicle radiographs. Fracture of the posterolateral left fourth rib, chronicity unclear. Fracture of the posterior second rim that appears recent. No other fractures. Glenohumeral and AC joints are normally aligned. IMPRESSION: 1. Distal left clavicle fracture. 2. Fractures of the posterior left second and posterolateral left fourth ribs, likely both acute/recent. 3. No other fractures.  No dislocation. Electronically Signed   By: Lajean Manes M.D.   On: 09/30/2021 10:43   DG Clavicle Left  Result  Date: 09/30/2021 CLINICAL DATA:  Left shoulder pain. EXAM: LEFT CLAVICLE - 2+ VIEWS COMPARISON:  None Available. FINDINGS: Fracture of the distal left clavicle. There is an oblique fracture line extending from the inferior aspect of the distal clavicle, at the level of the coracoclavicular ligament, distally and superiorly. A transverse fracture component extends across the distal aspect of the clavicle, 9 mm central to the [facility] joint. Distal fracture component is mildly displaced superiorly, by 3-4 mm. No other fractures.  No bone lesions.  AC joint is normally aligned. IMPRESSION: 1. Distal left clavicle fracture as detailed.  No dislocation. Electronically Signed   By: Lajean Manes M.D.   On: 09/30/2021 10:41   DG Chest Port 1 View  Result Date: 09/30/2021 CLINICAL DATA:  Dyspnea left shoulder pain. EXAM: PORTABLE CHEST 1 VIEW COMPARISON:  09/29/2021 FINDINGS: 0837 hours. The cardio pericardial silhouette is enlarged. Vascular congestion with diffuse interstitial opacity suggests edema. Minimal bibasilar atelectasis/infiltrate noted. Bones are diffusely demineralized. Telemetry leads overlie the chest. IMPRESSION: 1. Cardiomegaly with vascular congestion and diffuse interstitial opacity suggesting edema. 2. Minimal bibasilar atelectasis/infiltrate. Electronically Signed  By: Misty Stanley M.D.   On: 09/30/2021 09:21   CT Angio Chest PE W and/or Wo Contrast  Result Date: 09/29/2021 CLINICAL DATA:  Pulmonary embolism (PE) suspected, high prob. Shortness of breath bilateral leg swelling EXAM: CT ANGIOGRAPHY CHEST WITH CONTRAST TECHNIQUE: Multidetector CT imaging of the chest was performed using the standard protocol during bolus administration of intravenous contrast. Multiplanar CT image reconstructions and MIPs were obtained to evaluate the vascular anatomy. RADIATION DOSE REDUCTION: This exam was performed according to the departmental dose-optimization program which includes automated exposure control,  adjustment of the mA and/or kV according to patient size and/or use of iterative reconstruction technique. CONTRAST:  99m OMNIPAQUE IOHEXOL 350 MG/ML SOLN COMPARISON:  CT chest 04/23/2017 FINDINGS: Cardiovascular: Satisfactory opacification of the pulmonary arteries to the segmental level. No evidence of pulmonary embolism. The main pulmonary artery is normal in caliber. Enlarged heart size. No significant pericardial effusion. The thoracic aorta is normal in caliber. Mild atherosclerotic plaque of the thoracic aorta. At least left anterior descending coronary artery calcifications. Mediastinum/Nodes: No enlarged mediastinal, hilar, or axillary lymph nodes. Thyroid gland, trachea, and esophagus demonstrate no significant findings. Lungs/Pleura: Expiratory phase of respiration. Left lower lobe consolidation may represent combination of passive atelectasis versus infection/inflammation. Right lower lobe atelectasis. No pulmonary nodule. No pulmonary mass. Trace left pleural effusion. No pneumothorax. Upper Abdomen: There is a 3.2 cm fluid dense lesion within the right kidney that likely represents a simple renal cyst. Simple renal cysts, in the absence of clinically indicated signs/symptoms, require no independent follow-up. Musculoskeletal: No chest wall abnormality. No suspicious lytic or blastic osseous lesions. New from 2021, likely old healed minimally displaced 2-4 rib fractures. Age-indeterminate, likely acute, left displaced anterior 5-7 rib fractures as well as posteriorly displaced 6-7 rib fractures. No acute displaced fracture. Poorly visualized acute, comminuted, intra-articular left distal clavicular fracture. Review of the MIP images confirms the above findings. IMPRESSION: 1. No pulmonary embolus. 2. Trace left pleural effusion with left lower lobe consolidation may represent combination of passive atelectasis versus infection/inflammation. 3. Cardiomegaly. 4. Likely acute, left displaced anterior 5-7  rib fractures as well as posteriorly displaced 6-7 rib fractures (the 6 and 7 ribs are fractured in 2 different places). New from 2021, likely old healed minimally displaced 2-4 rib fractures. Recommend correlation with point tenderness to palpation to evaluate for an acute fracture. No associated pneumothorax. 5. Poorly visualized acute, comminuted, intra-articular left distal clavicular fracture. Recommend dedicated left shoulder and clavicle radiographs. Electronically Signed   By: MIven FinnM.D.   On: 09/29/2021 15:20   DG Chest 2 View  Result Date: 09/29/2021 CLINICAL DATA:  Shortness of breath EXAM: CHEST - 2 VIEW COMPARISON:  01/10/2021 FINDINGS: Chronic interstitial changes. New patchy density at the left lung base. No pleural effusion. No pneumothorax. Similar cardiomediastinal contours. No acute osseous abnormality. IMPRESSION: Patchy left basilar atelectasis/consolidation. Electronically Signed   By: PMacy MisM.D.   On: 09/29/2021 10:39   (Echo, Carotid, EGD, Colonoscopy, ERCP)    Subjective:   Discharge Exam: Vitals:   10/06/21 0805 10/06/21 1208  BP: 116/61 (!) 98/57  Pulse: 81 79  Resp: 17 17  Temp: 98 F (36.7 C) 98.2 F (36.8 C)  SpO2: 93% 99%   Vitals:   10/06/21 0451 10/06/21 0730 10/06/21 0805 10/06/21 1208  BP: (!) 122/55  116/61 (!) 98/57  Pulse: 71  81 79  Resp: 18  17 17   Temp: 97.8 F (36.6 C)  98 F (36.7 C) 98.2  F (36.8 C)  TempSrc:    Oral  SpO2: 95% 93% 93% 99%  Weight:      Height:        General: Pt is alert, awake, not in acute distress Cardiovascular: S1/S2 +, no rubs, no gallops Respiratory: decreased breath sounds b/l  Abdominal: Soft, NT, obese, bowel sounds + Extremities: no edema, no cyanosis    The results of significant diagnostics from this hospitalization (including imaging, microbiology, ancillary and laboratory) are listed below for reference.     Microbiology: Recent Results (from the past 240 hour(s))   Culture, blood (x 2)     Status: None   Collection Time: 09/29/21  7:00 PM   Specimen: BLOOD  Result Value Ref Range Status   Specimen Description BLOOD LEFT FA  Final   Special Requests   Final    BOTTLES DRAWN AEROBIC AND ANAEROBIC Blood Culture adequate volume   Culture   Final    NO GROWTH 5 DAYS Performed at [facility], [address], [place], [place] [address]    Report Status 10/04/2021 FINAL  Final  Culture, blood (x 2)     Status: None   Collection Time: 09/29/21  9:05 PM   Specimen: BLOOD  Result Value Ref Range Status   Specimen Description BLOOD BLOOD LEFT FOREARM  Final   Special Requests   Final    BOTTLES DRAWN AEROBIC ONLY Blood Culture adequate volume   Culture   Final    NO GROWTH 5 DAYS Performed at [facility], [address] [place], [place] [address]    Report Status 10/04/2021 FINAL  Final  Group A Strep by PCR     Status: None   Collection Time: 10/01/21  7:59 AM   Specimen: Throat; Sterile Swab  Result Value Ref Range Status   Group A Strep by PCR NOT DETECTED NOT DETECTED Final    Comment: Performed at [facility], [address] [place], [place] [address]  SARS Coronavirus 2 by RT PCR (hospital order, performed in [facility] hospital lab) *cepheid single result test* Throat     Status: None   Collection Time: 10/01/21  9:17 AM   Specimen: Throat; Nasal Swab  Result Value Ref Range Status   SARS Coronavirus 2 by RT PCR NEGATIVE NEGATIVE Final    Comment: (NOTE) SARS-CoV-2 target nucleic acids are NOT DETECTED.  The SARS-CoV-2 RNA is generally detectable in upper and lower respiratory specimens during the acute phase of infection. The lowest concentration of SARS-CoV-2 viral copies this assay can detect is 250 copies / mL. A negative result does not preclude SARS-CoV-2 infection and should not be used as the sole basis for treatment or other patient management decisions.  A negative result may occur  with improper specimen collection / handling, submission of specimen other than nasopharyngeal swab, presence of viral mutation(s) within the areas targeted by this assay, and inadequate number of viral copies (<250 copies / mL). A negative result must be combined with clinical observations, patient history, and epidemiological information.  Fact Sheet for Patients:   https://www.patel.info/  Fact Sheet for Healthcare Providers: https://hall.com/  This test is not yet approved or  cleared by the [place] FDA and has been authorized for detection and/or diagnosis of SARS-CoV-2 by FDA under an Emergency Use Authorization (EUA).  This EUA will remain in effect (meaning this test can be used) for the duration of the COVID-19 declaration under Section 564(b)(1) of the Act, 21 U.S.C.  section 360bbb-3(b)(1), unless the authorization is terminated or revoked sooner.  Performed at [facility], [address] [place], [place] [address]      Labs: BNP (last 3 results) Recent Labs    12/04/20 0901 12/12/20 1154 09/29/21 1012  BNP 66.0 35.3 157.2*   Basic Metabolic Panel: Recent Labs  Lab 09/30/21 1100 09/30/21 1934 10/02/21 0502 10/03/21 0500 10/04/21 0538 10/05/21 0431 10/06/21 0516  NA 138   < > 139 137 140 138 138  K 3.4*   < > 3.3* 4.2 4.4 4.2 4.6  CL 100   < > 103 101 98 94* 91*  CO2 23   < > 30 32 36* 37* 37*  GLUCOSE 162*   < > 123* 138* 124* 138* 128*  130*  BUN 8   < > 17 13 16 12 14   CREATININE 0.80   < > 0.86 0.77 0.79 0.72 0.83  CALCIUM 9.4   < > 8.8* 9.1 9.8 9.5 10.1  MG 1.8  --   --   --   --   --   --    < > = values in this interval not displayed.   Liver Function Tests: Recent Labs  Lab 09/29/21 1521  AST 51*  ALT 51*  ALKPHOS 79  BILITOT 0.9  PROT 5.8*  ALBUMIN 3.0*   No results for input(s): "LIPASE", "AMYLASE" in the last 168 hours. No results for input(s): "AMMONIA" in the last 168  hours. CBC: Recent Labs  Lab 09/29/21 1521 09/30/21 0431 10/01/21 0826 10/02/21 0502 10/04/21 0927 10/05/21 0431 10/06/21 0516  WBC 10.8*   < > 15.8* 9.2 10.3 8.0 8.7  NEUTROABS 8.4*  --   --  7.4  --   --   --   HGB 11.3*   < > 9.7* 9.2* 10.7* 10.2* 11.1*  HCT 35.5*   < > 30.7* 29.5* 34.6* 33.0* 35.7*  MCV 91.7   < > 93.3 94.2 95.6 95.9 94.9  PLT 298   < > 215 207 366 308 313   < > = values in this interval not displayed.   Cardiac Enzymes: No results for input(s): "CKTOTAL", "CKMB", "CKMBINDEX", "TROPONINI" in the last 168 hours. BNP: Invalid input(s): "POCBNP" CBG: Recent Labs  Lab 09/30/21 0823 09/30/21 1100  GLUCAP 159* 140*   D-Dimer No results for input(s): "DDIMER" in the last 72 hours. Hgb A1c No results for input(s): "HGBA1C" in the last 72 hours. Lipid Profile No results for input(s): "CHOL", "HDL", "LDLCALC", "TRIG", "CHOLHDL", "LDLDIRECT" in the last 72 hours. Thyroid function studies No results for input(s): "TSH", "T4TOTAL", "T3FREE", "THYROIDAB" in the last 72 hours.  Invalid input(s): "FREET3" Anemia work up No results for input(s): "VITAMINB12", "FOLATE", "FERRITIN", "TIBC", "IRON", "RETICCTPCT" in the last 72 hours. Urinalysis    Component Value Date/Time   COLORURINE YELLOW 08/17/2021 1049   APPEARANCEUR CLEAR 08/17/2021 1049   APPEARANCEUR Cloudy (A) 01/01/2017 1403   LABSPEC 1.015 08/17/2021 1049   PHURINE 6.0 08/17/2021 1049   GLUCOSEU NEGATIVE 08/17/2021 1049   HGBUR NEGATIVE 08/17/2021 1049   HGBUR trace-lysed 12/20/2009 1107   BILIRUBINUR NEGATIVE 08/17/2021 1049   BILIRUBINUR neg 06/06/2020 1553   BILIRUBINUR Negative 01/01/2017 1403   KETONESUR NEGATIVE 08/17/2021 1049   PROTEINUR Negative 09/07/2019 1442   PROTEINUR 1+ (A) 01/01/2017 1403   PROTEINUR NEGATIVE 07/12/2010 0738   UROBILINOGEN 0.2 08/17/2021 1049   NITRITE NEGATIVE 08/17/2021 1049   LEUKOCYTESUR NEGATIVE 08/17/2021 1049   Sepsis Labs Recent  Labs  Lab  10/02/21 0502 10/04/21 0927 10/05/21 0431 10/06/21 0516  WBC 9.2 10.3 8.0 8.7   Microbiology Recent Results (from the past 240 hour(s))  Culture, blood (x 2)     Status: None   Collection Time: 09/29/21  7:00 PM   Specimen: BLOOD  Result Value Ref Range Status   Specimen Description BLOOD LEFT FA  Final   Special Requests   Final    BOTTLES DRAWN AEROBIC AND ANAEROBIC Blood Culture adequate volume   Culture   Final    NO GROWTH 5 DAYS Performed at [facility], [address] [place], [place] [address]    Report Status 10/04/2021 FINAL  Final  Culture, blood (x 2)     Status: None   Collection Time: 09/29/21  9:05 PM   Specimen: BLOOD  Result Value Ref Range Status   Specimen Description BLOOD BLOOD LEFT FOREARM  Final   Special Requests   Final    BOTTLES DRAWN AEROBIC ONLY Blood Culture adequate volume   Culture   Final    NO GROWTH 5 DAYS Performed at [facility], [address] [place], [place] [address]    Report Status 10/04/2021 FINAL  Final  Group A Strep by PCR     Status: None   Collection Time: 10/01/21  7:59 AM   Specimen: Throat; Sterile Swab  Result Value Ref Range Status   Group A Strep by PCR NOT DETECTED NOT DETECTED Final    Comment: Performed at [facility], [address] [place], [place] [address]  SARS Coronavirus 2 by RT PCR (hospital order, performed in [facility] hospital lab) *cepheid single result test* Throat     Status: None   Collection Time: 10/01/21  9:17 AM   Specimen: Throat; Nasal Swab  Result Value Ref Range Status   SARS Coronavirus 2 by RT PCR NEGATIVE NEGATIVE Final    Comment: (NOTE) SARS-CoV-2 target nucleic acids are NOT DETECTED.  The SARS-CoV-2 RNA is generally detectable in upper and lower respiratory specimens during the acute phase of infection. The lowest concentration of SARS-CoV-2 viral copies this assay can detect is 250 copies / mL. A negative result does not preclude SARS-CoV-2  infection and should not be used as the sole basis for treatment or other patient management decisions.  A negative result may occur with improper specimen collection / handling, submission of specimen other than nasopharyngeal swab, presence of viral mutation(s) within the areas targeted by this assay, and inadequate number of viral copies (<250 copies / mL). A negative result must be combined with clinical observations, patient history, and epidemiological information.  Fact Sheet for Patients:   https://www.patel.info/  Fact Sheet for Healthcare Providers: https://hall.com/  This test is not yet approved or  cleared by the [place] FDA and has been authorized for detection and/or diagnosis of SARS-CoV-2 by FDA under an Emergency Use Authorization (EUA).  This EUA will remain in effect (meaning this test can be used) for the duration of the COVID-19 declaration under Section 564(b)(1) of the Act, 21 U.S.C. section 360bbb-3(b)(1), unless the authorization is terminated or revoked sooner.  Performed at [facility], [address], [place], [place] [address]      Time coordinating discharge: Over 30 minutes  SIGNED:   Wyvonnia Dusky, MD  [facility] 10/06/2021, 2:09 PM Pager   If 7PM-7AM, please contact night-coverage [email or web address]

## 2021-10-06 NOTE — TOC Transition Note (Signed)
Transition of Care [facility]) - CM/SW Discharge Note   Patient Details  [name] MRN: [id number] Date of Birth: 1955-05-30  Transition of Care [facility]) CM/SW Contact:  Candie Chroman, LCSW Phone Number: 10/06/2021, 1:57 PM   Clinical Narrative:  Patient has orders to discharge home today. [facility] representative is aware. No further concerns. CSW signing off.  Final next level of care: Home w Home Health Services Barriers to Discharge: Barriers Resolved   Patient Goals and CMS Choice     Choice offered to / list presented to : Patient, Spouse  Discharge Placement                Patient to be transferred to facility by: Husband Name of family member notified: Lynder Parents Patient and family notified of of transfer: 10/06/21  Discharge Plan and Services     Post Acute Care Choice: Home Health, Durable Medical Equipment          DME Arranged: Gilford Rile, 3-N-1, Oxygen DME Agency: [facility] Date DME Agency Contacted: 10/06/21   Representative spoke with at DME Agency: Suanne Marker HH Arranged: PT, OT, RN [facility] Agency: [place] Date [place]: 10/06/21   Representative spoke with at [place]: Bobbe Medico  Social Determinants of Health ([place]) Interventions     Readmission Risk Interventions     No data to display

## 2021-10-06 NOTE — Progress Notes (Signed)
SATURATION QUALIFICATIONS: (This note is used to comply with regulatory documentation for home oxygen)  Patient Saturations on Room Air at Rest = 92%  Patient Saturations on Room Air while Ambulating = 82%  Patient Saturations on 2 Liters of oxygen while Ambulating = 94%  Please briefly explain why patient needs home oxygen: to maintain sats >90% while ambulating

## 2021-10-06 NOTE — TOC Progression Note (Addendum)
Transition of Care [facility]) - Progression Note    Patient Details  [name] MRN: [id number] Date of Birth: 1955-07-18  Transition of Care [facility]) CM/SW [place], LCSW Phone Number: 10/06/2021, 10:09 AM  Clinical Narrative:   LATE ENTRY: Met with patient yesterday to address concerns with going home on oxygen. Told her RN will complete sats test to determine if she qualifies. Patient requesting home health RN if she needs to go home on oxygen. [name] will not have nurse availability until probably Tuesday. Patient is agreeable to this and does not want to check with other agencies.  10:33 am: Ordered home oxygen through [employer]. Left message for [facility] representative to notify.  Expected Discharge Plan: [place] Barriers to Discharge: Continued Medical Work up  Expected Discharge Plan and Services Expected Discharge Plan: [place] Choice: Home Health, Durable Medical Equipment Living arrangements for the past 2 months: Single Family Home                                       Social Determinants of Health (SDOH) Interventions    Readmission Risk Interventions     No data to display

## 2021-10-06 NOTE — Progress Notes (Signed)
Physical Therapy Treatment Patient Details [name] MRN: [id number] DOB: 1955/02/28 Today's Date: 10/06/2021   History of Present Illness 66 y.o. Caucasian female with medical history significant for atrial fibrillation on Eliquis, asthma, GERD, hypertension, dyslipidemia, IBS, OSA on CPAP, type 2 diabetes mellitus and dyslipidemia and obesity.~ 2 weeks prior to this ED arrival pt was visiting family in [place] and fell with subsequent left-sided 8 ribs fracture as well as left clavicular fracture for which she was hospitalized for several days and had apparently had hypercarbic respiratory failure.  Pt was discharged and drove (husband driving) back to [place] where she was home ~12 hours before needing to come to the ED.    PT Comments    Pt seen for PT tx with pt agreeable & husband present for session. Pt reports she is eager to d/c home & anticipating d/c home today. Pt is able to complete STS with supervision with cuing to reach back for armrests during stand>sit. Pt ambulates to door & back with RW & supervision with 1 standing rest break with pt only endorsing weakness during mobility. Pt declines further exercises or practicing stair negotiation with both pt & husband voicing comfort with getting pt into the house as they did this prior to admission when pt was more weak & they plan to have 2 people assisting her. Educated pt on need to ambulate as much as possible throughout the day when at home & encouraged to her start ambulating to the BSC/toilet while in acute setting vs using pure wick. Will continue to follow pt acutely to address balance, strengthening, gait & stairs.  Pt received on 1.5L/min via nasal cannula, SpO2 >90% Pt placed on room air & sitting & for gait. SpO2 dropped to 85% after gait but pt able to recover to >/= 90% & placed back on 2L/min at end of session. (PT provided cuing for pursed lip breathing & nurse notified of O2 during session.)     Recommendations  for follow up therapy are one component of a multi-disciplinary discharge planning process, led by the attending physician.  Recommendations may be updated based on patient status, additional functional criteria and insurance authorization.  Follow Up Recommendations  Home health PT Can patient physically be transported by private vehicle: Yes   Assistance Recommended at Discharge Intermittent Supervision/Assistance  Patient can return home with the following A little help with bathing/dressing/bathroom;A little help with walking and/or transfers;Assistance with cooking/housework;Assist for transportation;Help with stairs or ramp for entrance   Equipment Recommendations  Rolling walker (2 wheels);BSC/3in1    Recommendations for Other Services       Precautions / Restrictions Precautions Precautions: Fall Restrictions Weight Bearing Restrictions: No RUE Weight Bearing:  (post stent, UE access precautions) LUE Weight Bearing: Weight bearing as tolerated     Mobility  Bed Mobility               General bed mobility comments: not tested, pt received & left up in recliner    Transfers Overall transfer level: Needs assistance Equipment used: Rolling walker (2 wheels) Transfers: Sit to/from Stand Sit to Stand: Supervision           General transfer comment: STS from recliner with RW & supervision, cuing for safe hand placement to reach back for armrests during stand>sit    Ambulation/Gait Ambulation/Gait assistance: Supervision Gait Distance (Feet): 25 Feet Assistive device: Rolling walker (2 wheels) Gait Pattern/deviations: Decreased step length - right, Decreased step length - left, Decreased stride  length Gait velocity: decreased         Stairs             Wheelchair Mobility    Modified Rankin (Stroke Patients Only)       Balance Overall balance assessment: Needs assistance Sitting-balance support: No upper extremity supported Sitting  balance-Leahy Scale: Good     Standing balance support: During functional activity, Bilateral upper extremity supported Standing balance-Leahy Scale: Fair                              Cognition Arousal/Alertness: Awake/alert Behavior During Therapy: WFL for tasks assessed/performed Overall Cognitive Status: Within Functional Limits for tasks assessed                                          Exercises      General Comments        Pertinent Vitals/Pain Pain Assessment Pain Assessment: No/denies pain    Home Living                          Prior Function            PT Goals (current goals can now be found in the care plan section) Acute Rehab PT Goals Patient Stated Goal: go home PT Goal Formulation: With patient Time For Goal Achievement: 10/16/21 Potential to Achieve Goals: Good Progress towards PT goals: Progressing toward goals    Frequency    Min 2X/week      PT Plan Discharge plan needs to be updated    Co-evaluation              AM-PAC PT "6 Clicks" Mobility   Outcome Measure  Help needed turning from your back to your side while in a flat bed without using bedrails?: None Help needed moving from lying on your back to sitting on the side of a flat bed without using bedrails?: A Little Help needed moving to and from a bed to a chair (including a wheelchair)?: A Little Help needed standing up from a chair using your arms (e.g., wheelchair or bedside chair)?: A Little Help needed to walk in hospital room?: A Little Help needed climbing 3-5 steps with a railing? : A Lot 6 Click Score: 18    End of Session   Activity Tolerance: Patient tolerated treatment well;Patient limited by fatigue Patient left: with chair alarm set;in chair;with call bell/phone within reach;with family/visitor present Nurse Communication: Mobility status (O2) PT Visit Diagnosis: Muscle weakness (generalized) (M62.81);Difficulty in  walking, not elsewhere classified (R26.2);History of falling (Z91.81);Unsteadiness on feet (R26.81)     Time: 6948-5462 PT Time Calculation (min) (ACUTE ONLY): 13 min  Charges:  $Therapeutic Activity: 8-22 mins                     Lavone Nian, PT, DPT 10/06/21, 11:52 AM   Waunita Schooner 10/06/2021, 11:48 AM

## 2021-10-08 ENCOUNTER — Other Ambulatory Visit: Payer: Self-pay | Admitting: Internal Medicine

## 2021-10-08 DIAGNOSIS — I480 Paroxysmal atrial fibrillation: Secondary | ICD-10-CM

## 2021-10-09 ENCOUNTER — Telehealth: Payer: Self-pay | Admitting: Internal Medicine

## 2021-10-09 ENCOUNTER — Telehealth: Payer: Self-pay

## 2021-10-09 DIAGNOSIS — S42002D Fracture of unspecified part of left clavicle, subsequent encounter for fracture with routine healing: Secondary | ICD-10-CM | POA: Diagnosis not present

## 2021-10-09 DIAGNOSIS — G4733 Obstructive sleep apnea (adult) (pediatric): Secondary | ICD-10-CM | POA: Diagnosis not present

## 2021-10-09 DIAGNOSIS — I5181 Takotsubo syndrome: Secondary | ICD-10-CM | POA: Diagnosis not present

## 2021-10-09 DIAGNOSIS — Z9989 Dependence on other enabling machines and devices: Secondary | ICD-10-CM | POA: Diagnosis not present

## 2021-10-09 DIAGNOSIS — J189 Pneumonia, unspecified organism: Secondary | ICD-10-CM | POA: Diagnosis not present

## 2021-10-09 DIAGNOSIS — Z7901 Long term (current) use of anticoagulants: Secondary | ICD-10-CM | POA: Diagnosis not present

## 2021-10-09 DIAGNOSIS — I119 Hypertensive heart disease without heart failure: Secondary | ICD-10-CM | POA: Diagnosis not present

## 2021-10-09 DIAGNOSIS — S2241XD Multiple fractures of ribs, right side, subsequent encounter for fracture with routine healing: Secondary | ICD-10-CM | POA: Diagnosis not present

## 2021-10-09 DIAGNOSIS — I214 Non-ST elevation (NSTEMI) myocardial infarction: Secondary | ICD-10-CM | POA: Diagnosis not present

## 2021-10-09 DIAGNOSIS — I480 Paroxysmal atrial fibrillation: Secondary | ICD-10-CM | POA: Diagnosis not present

## 2021-10-09 NOTE — Telephone Encounter (Signed)
Stacy with [name] called to get approval for home PT frequency: 2 wk 1 visit 1 wk 1 visit 2 wk 1 visit 1 wk 2 visits  Call back number is [phone] and it is secure - you can leave voicemail.

## 2021-10-09 NOTE — Telephone Encounter (Signed)
Transition Care Management Unsuccessful Follow-up Telephone Call  Date of discharge and from where:  TCM DC [facility] 10-06-21 Dx : stress induced cardiomyopathy  Attempts:  1st Attempt  Reason for unsuccessful TCM follow-up call:  Left voice message  Transition Care Management Follow-up Telephone Call Date of discharge and from where: TCM DC [facility] 10-06-21 Dx : stress induced cardiomyopathy How have you been since you were released from the hospital? Doing ok- coming along  Any questions or concerns? No  Items Reviewed: Did the pt receive and understand the discharge instructions provided? Yes  Medications obtained and verified? Yes  Other? No  Any new allergies since your discharge? No  Dietary orders reviewed? Yes Do you have support at home? Yes   Home Care and Equipment/Supplies: Were home health services ordered? yes If so, what is the name of the agency? [facility]   Has the agency set up a time to come to the patient's home? yes Were any new equipment or medical supplies ordered?  Yes: oxygen- RW What is the name of the medical supply agency? [employer] [facility]  Were you able to get the supplies/equipment? yes Do you have any questions related to the use of the equipment or supplies? No  Functional Questionnaire: (I = Independent and D = Dependent) ADLs: I  Bathing/Dressing- I  Meal Prep- I  Eating- I  Maintaining continence- I  Transferring/Ambulation- I- WALKER  Managing Meds- I  Follow up appointments reviewed:  PCP Hospital f/u appt confirmed? No- pt needs to be seen by 10-20-21- Dr Ronnald Ramp has no appts but pt only wants to see Dr Ronnald Ramp- told pt I would have the front desk call patient- patient awaiting call  [facility] f/u appt confirmed? Yes  Scheduled to see Dr Saralyn Pilar on 11-09-21 @ 345pm. Are transportation arrangements needed? No  If their condition worsens, is the pt aware to call PCP or go to the Emergency Dept.? Yes Was the patient provided with  contact information for the PCP's office or ED? Yes Was to pt encouraged to call back with questions or concerns? Yes

## 2021-10-09 NOTE — Telephone Encounter (Signed)
Verbal orders given for PT.

## 2021-10-12 DIAGNOSIS — J8410 Pulmonary fibrosis, unspecified: Secondary | ICD-10-CM | POA: Diagnosis not present

## 2021-10-12 DIAGNOSIS — J189 Pneumonia, unspecified organism: Secondary | ICD-10-CM | POA: Diagnosis not present

## 2021-10-12 DIAGNOSIS — I5181 Takotsubo syndrome: Secondary | ICD-10-CM | POA: Diagnosis not present

## 2021-10-13 DIAGNOSIS — G8929 Other chronic pain: Secondary | ICD-10-CM | POA: Diagnosis not present

## 2021-10-13 DIAGNOSIS — S42035D Nondisplaced fracture of lateral end of left clavicle, subsequent encounter for fracture with routine healing: Secondary | ICD-10-CM | POA: Diagnosis not present

## 2021-10-13 DIAGNOSIS — M461 Sacroiliitis, not elsewhere classified: Secondary | ICD-10-CM | POA: Diagnosis not present

## 2021-10-13 DIAGNOSIS — M5442 Lumbago with sciatica, left side: Secondary | ICD-10-CM | POA: Diagnosis not present

## 2021-10-13 DIAGNOSIS — S2243XS Multiple fractures of ribs, bilateral, sequela: Secondary | ICD-10-CM | POA: Diagnosis not present

## 2021-10-13 DIAGNOSIS — M5441 Lumbago with sciatica, right side: Secondary | ICD-10-CM | POA: Diagnosis not present

## 2021-10-13 DIAGNOSIS — S40021A Contusion of right upper arm, initial encounter: Secondary | ICD-10-CM | POA: Diagnosis not present

## 2021-10-15 DIAGNOSIS — G4733 Obstructive sleep apnea (adult) (pediatric): Secondary | ICD-10-CM | POA: Diagnosis not present

## 2021-10-17 ENCOUNTER — Encounter: Payer: Self-pay | Admitting: Adult Health

## 2021-10-17 ENCOUNTER — Other Ambulatory Visit: Payer: Self-pay | Admitting: Internal Medicine

## 2021-10-17 ENCOUNTER — Ambulatory Visit (INDEPENDENT_AMBULATORY_CARE_PROVIDER_SITE_OTHER): Payer: PPO | Admitting: Adult Health

## 2021-10-17 DIAGNOSIS — J4530 Mild persistent asthma, uncomplicated: Secondary | ICD-10-CM | POA: Diagnosis not present

## 2021-10-17 DIAGNOSIS — J9611 Chronic respiratory failure with hypoxia: Secondary | ICD-10-CM | POA: Diagnosis not present

## 2021-10-17 DIAGNOSIS — G4733 Obstructive sleep apnea (adult) (pediatric): Secondary | ICD-10-CM | POA: Diagnosis not present

## 2021-10-17 DIAGNOSIS — I5181 Takotsubo syndrome: Secondary | ICD-10-CM

## 2021-10-17 DIAGNOSIS — Z9989 Dependence on other enabling machines and devices: Secondary | ICD-10-CM

## 2021-10-17 DIAGNOSIS — E039 Hypothyroidism, unspecified: Secondary | ICD-10-CM

## 2021-10-17 NOTE — Addendum Note (Signed)
Addended by: Vanessa Barbara on: 10/17/2021 04:42 PM   Modules accepted: Orders

## 2021-10-17 NOTE — Assessment & Plan Note (Signed)
Patient has underlying severe sleep apnea.  She has excellent compliance on CPAP.  CPAP download shows excellent control and compliance.  Continue same settings.

## 2021-10-17 NOTE — Progress Notes (Signed)
Reviewed and agree with assessment/plan.   Chesley Mires, MD [facility] Pulmonary/Critical Care 10/17/2021, 3:49 PM Pager:  [phone]

## 2021-10-17 NOTE — Assessment & Plan Note (Signed)
Recent prolonged hospitalization after traumatic fall last month.  She had multiple rib fractures and clavicular fracture.  This was complicated by acute hypoxic and hypercarbic respiratory failure, stress-induced cardiomyopathy, decompensated congestive heart failure.  Patient was hospitalized for several weeks.  She is slowly starting to improve.  She is to continue physical therapy at home.  Follow-up with cardiology.  Continue on current regimen.  Plan  Patient Instructions  Refer to Dr. Ronnald Ramp PCP for post hospital follow up  Follow up with Cardiology  Home PT as planned.  Continue on CPAP At bedtime  with Oxygen 2l/m  Continue on Oxygen 2l/m with activity , to keep sats >88-90%.  Follow up with Dr. Halford Chessman  or Dalyn Becker NP in 1 month and As needed   Please contact office for sooner follow up if symptoms do not improve or worsen or seek emergency care

## 2021-10-17 NOTE — Progress Notes (Signed)
@Patient  ID: Kirsten [name], female    DOB: 08/21/1955, 66 y.o.   MRN: [id number]  Chief Complaint  Patient presents with   Hospitalization Follow-up    Referring provider: [name], MD  HPI: 66 year old female never smoker followed for sleep apnea and asthma Medical history significant for diastolic heart failure, A-fib on amiodarone and Eliquis, diabetes  TEST/EVENTS :  PFT 09/20/09 >> FEV1 1.55 (57%), FEV1% 83, TLC 3.00 (54%), DLCO 58% RAST 11/02/15 >> negative IgE 10/28/20 >> 3   Sleep Tests:  PSG 07/22/08 >> AHI 80.4, SpO2 low 82% CPAP 02/22/21 to 02/27/21 >> used on 6 of 6 nights with average 7 hrs 3 min.  Average AHI 0.7 with CPAP 8 cm H2O   Cardiac Tests:  Echo 12/12/20 >> EF greater than 55%, mild LVH, grade 2 DD   10/17/2021 Follow up : Asthma, OSA , Post hospital follow up  Patient presents for a posthospital follow-up.  Patient has underlying asthma and sleep apnea.  Patient was recently hospitalized last month for stress-induced/takotsubo cardiomyopathy.  Prior to admission patient was recently traveling in [place] had a traumatic fall.  Had left-sided multiple rib fractures and left clavicular fracture.  She did require hospitalization in [place] for several days and hospital stay was complicated by hypercarbic respiratory failure.  Patient presented to the emergency room with progressive shortness of breath.  CT chest was negative for PE.  Cardiac cath was negative for nonobstructive coronary disease.  Notable apical akinesis and stress cardiomyopathy with decreased EF at 35 to 45% felt likely a physiological stress from recent rib fractures.  She also was treated for pneumonia with full course of antibiotics.   CT chest showed trace left pleural effusion and left lower lobe consolidation.  Left-sided multiple rib fractures and left clavicular fracture. She did require oxygen at discharge.  Patient is on 2 L of oxygen with activity and at bedtime.  Patient says she  does not like her oxygen concentrator at home.  She has to feel the tanks and is very difficult for her to do this. She required diuresis for volume overload. Has OSA on CPAP, patient says she cannot sleep without her CPAP.  She wears her CPAP every single night.  Patient's CPAP download shows excellent compliance since discharge from hospital.  She is on CPAP 8 cm H2O.  AHI 0.3.  Daily average usage at 11 hours. Had some confusion during hospital stay , this is resolved since discharge.  Patient says she is slowly improving but remains very weak.  She is starting physical therapy this week.  Is now having to walk with a walker and is very unsteady at times.  She does have underlying asthma says that she has had no increase in cough or wheezing.  No increased albuterol use. Patient says she has significant swelling but this has improved since discharge.  Prior to her fall and hospitalization patient says she was completely independent .    Allergies  Allergen Reactions   Doxycycline Rash   Factive [Gemifloxacin Mesylate] Rash   Crestor [Rosuvastatin]     GI upset   Sulfonamide Derivatives     REACTION: rash   Gemifloxacin Rash   Penicillins Hives and Rash    Has patient had a PCN reaction causing immediate rash, facial/tongue/throat swelling, SOB or lightheadedness with hypotension: No Has patient had a PCN reaction causing severe rash involving mucus membranes or skin necrosis: No Has patient had a PCN reaction that  required hospitalization No Has patient had a PCN reaction occurring within the last 10 years: No If all of the above answers are "NO", then may proceed with Cephalosporin use.     Immunization History  Administered Date(s) Administered   Influenza Split 11/29/2010, 11/23/2011   Influenza Whole 11/12/2009   Influenza,inj,Quad PF,6+ Mos 10/22/2012, 12/31/2013, 11/02/2014, 11/28/2015, 11/23/2016, 12/25/2017, 11/03/2018, 11/16/2019, 12/20/2020   PFIZER(Purple Top)SARS-COV-2  Vaccination 04/25/2019, 05/20/2019, 02/08/2020   PNEUMOCOCCAL CONJUGATE-20 12/20/2020   Pneumococcal Polysaccharide-23 11/23/2011, 11/16/2019   Tdap 08/02/2010    Past Medical History:  Diagnosis Date   A-fib ([place])    Anxiety disorder    Asthma    Dyspnea    Gastroschisis    umphalocele, rotated organs abd repair until age 61   GERD (gastroesophageal reflux disease)    HTN (hypertension)    Hyperlipidemia    IBS (irritable bowel syndrome)    Morbid obesity ([facility])    Target wt - 185  for BMI < 30   Obesity    OSA on CPAP    SBO (small bowel obstruction) ([place])    Resolved with NG/Bowel rest around 2009   Sleep apnea    Type II or unspecified type diabetes mellitus without mention of complication, not stated as uncontrolled     Tobacco History: Social History   Tobacco Use  Smoking Status Never  Smokeless Tobacco Never  Tobacco Comments   "tried as a teen"   Counseling given: Not Answered Tobacco comments: "tried as a teen"   Outpatient Medications Prior to Visit  Medication Sig Dispense Refill   albuterol (VENTOLIN HFA) 108 (90 Base) MCG/ACT inhaler Inhale 2 puffs into the lungs every 6 (six) hours as needed for wheezing or shortness of breath. 18 g 2   amiodarone (PACERONE) 200 MG tablet Take 1 tablet (200 mg total) by mouth daily. 90 tablet 1   apixaban (ELIQUIS) 5 MG TABS tablet Take 1 tablet (5 mg total) by mouth 2 (two) times daily. 180 tablet 1   atorvastatin (LIPITOR) 40 MG tablet TAKE ONE TABLET BY MOUTH ONE TIME DAILY 90 tablet 1   Continuous Blood Gluc Sensor (FREESTYLE LIBRE 3 SENSOR) MISC PLACE ONE SENSOR ON THE BACK OF YOUR UPPER ARM EVERY 14 DAYS 1 each 5   cyanocobalamin (,VITAMIN B-12,) 1000 MCG/ML injection INJECT 1ML INTO THE MUSCLE ONCE A MONTH 10 mL 0   furosemide (LASIX) 20 MG tablet Take 1 tablet (20 mg total) by mouth daily. 30 tablet 0   levothyroxine (SYNTHROID) 175 MCG tablet Take 1 tablet (175 mcg total) by mouth daily. 90 tablet 1   losartan  (COZAAR) 25 MG tablet Take 1 tablet (25 mg total) by mouth daily. 30 tablet 0   metFORMIN (GLUCOPHAGE) 1000 MG tablet TAKE ONE TABLET BY MOUTH TWICE A DAY 180 tablet 1   metoprolol succinate (TOPROL-XL) 25 MG 24 hr tablet Take 0.5 tablets (12.5 mg total) by mouth daily. 15 tablet 0   PARoxetine (PAXIL-CR) 25 MG 24 hr tablet TAKE 1 TABLET(25 MG) BY MOUTH DAILY 90 tablet 1   zolpidem (AMBIEN) 10 MG tablet Take 1 tablet (10 mg total) by mouth at bedtime as needed for sleep. 90 tablet 0   No facility-administered medications prior to visit.     Review of Systems:   Constitutional:   No  weight loss, night sweats,  Fevers, chills,  +fatigue, or  lassitude.  HEENT:   No headaches,  Difficulty swallowing,  Tooth/dental problems, or  Sore throat,  No sneezing, itching, ear ache, nasal congestion, post nasal drip,   CV:  No chest pain,  Orthopnea, PND, swelling in lower extremities, anasarca, dizziness, palpitations, syncope.   GI  No heartburn, indigestion, abdominal pain, nausea, vomiting, diarrhea, change in bowel habits, loss of appetite, bloody stools.   Resp: No excess mucus, no productive cough,  No non-productive cough,  No coughing up of blood.  No change in color of mucus.  No wheezing.  No chest wall deformity  Skin: no rash or lesions.  GU: no dysuria, change in color of urine, no urgency or frequency.  No flank pain, no hematuria   MS:  No joint pain or swelling.  No decreased range of motion.  No back pain.    Physical Exam  BP 120/64 (BP Location: Right Arm, Patient Position: Sitting, Cuff Size: Large)   Pulse 81   Temp 98.4 F (36.9 C) (Oral)   Ht 5' 7"  (1.702 m)   Wt 185 lb 6.4 oz (84.1 kg)   SpO2 95%   BMI 29.04 kg/m   GEN: A/Ox3; pleasant , NAD, well nourished , in wc    HEENT:  [place]/AT,   NOSE-clear, THROAT-clear, no lesions, no postnasal drip or exudate noted.  Class 2-3 MP airway   NECK:  Supple w/ fair ROM; no JVD; normal carotid impulses w/o  bruits; no thyromegaly or nodules palpated; no lymphadenopathy.    RESP  Clear  P & A; w/o, wheezes/ rales/ or rhonchi. no accessory muscle use, no dullness to percussion  CARD:  RRR, no m/r/g, no peripheral edema, pulses intact, no cyanosis or clubbing.  GI:   Soft & nt; nml bowel sounds; no organomegaly or masses detected.   Musco: Warm bil, no deformities or joint swelling noted.   Neuro: alert, no focal deficits noted.    Skin: Warm, no lesions or rashes, scattered bruises along arm .     Lab Results:   BMET   Imaging: CARDIAC CATHETERIZATION  Result Date: 10/02/2021   Mid LAD lesion is 30% stenosed.   There is mild to moderate left ventricular systolic dysfunction.   LV end diastolic pressure is moderately elevated.   The left ventricular ejection fraction is 35-45% by visual estimate. 1.  Elevated pulmonary capillary wedge pressure, LVEDP consistent with pulmonary edema 2.  Nonobstructive coronary artery disease 3.  Mild to moderate reduced left ventricular function with apical wall akinesis consistent with Takotsubo's cardiomyopathy following recent fall and multiple rib and clavicular fracture Recommendations 1.  Medical therapy 2.  DC heparin 3.  Add metoprolol succinate and losartan 4.  Furosemide 60 mg IV twice daily 5.  Resume Eliquis on 10/03/2021   ECHOCARDIOGRAM COMPLETE  Result Date: 09/30/2021    ECHOCARDIOGRAM REPORT   Patient Name:   SHANTORIA ELLWOOD Date of Exam: 09/30/2021 Medical Rec #:  [id number]        Height:       67.0 in Accession #:    [id number]       Weight:       192.0 lb Date of Birth:  1955/11/21        BSA:          1.988 m Patient Age:    78 years         BP:           102/58 mmHg Patient Gender: F                HR:  90 bpm. Exam Location:  [facility] Procedure: 2D Echo and Intracardiac Opacification Agent Indications:     NSTEMI I21.4  History:         Patient has prior history of Echocardiogram examinations, most                  recent 12/12/2020.   Sonographer:     Kathlen Brunswick RDCS Referring Phys:  [id number] [place] TANG Diagnosing Phys: Isaias Cowman MD  Sonographer Comments: Technically challenging study due to limited acoustic windows, no parasternal window, no apical window and no subcostal window. The patient was on Bipap at the time of this study. There were no acoustic windows obtainable on this study. Definity IV ultrasound imaging agent attempted to enhance endocardial definition. IMPRESSIONS  1. Left ventricular ejection fraction, by estimation, is 55 to 60%. The left ventricle has normal function. The left ventricle has no regional wall motion abnormalities. Left ventricular diastolic parameters are indeterminate.  2. Right ventricular systolic function is normal. The right ventricular size is normal.  3. The mitral valve is normal in structure. No evidence of mitral valve regurgitation. No evidence of mitral stenosis.  4. The aortic valve is normal in structure. Aortic valve regurgitation is not visualized. No aortic stenosis is present.  5. The inferior vena cava is normal in size with greater than 50% respiratory variability, suggesting right atrial pressure of 3 mmHg. FINDINGS  Left Ventricle: Left ventricular ejection fraction, by estimation, is 55 to 60%. The left ventricle has normal function. The left ventricle has no regional wall motion abnormalities. Definity contrast agent was given IV to delineate the left ventricular  endocardial borders. The left ventricular internal cavity size was normal in size. There is no left ventricular hypertrophy. Left ventricular diastolic parameters are indeterminate. Right Ventricle: The right ventricular size is normal. No increase in right ventricular wall thickness. Right ventricular systolic function is normal. Left Atrium: Left atrial size was normal in size. Right Atrium: Right atrial size was normal in size. Pericardium: There is no evidence of pericardial effusion. Mitral Valve: The  mitral valve is normal in structure. No evidence of mitral valve regurgitation. No evidence of mitral valve stenosis. Tricuspid Valve: The tricuspid valve is normal in structure. Tricuspid valve regurgitation is not demonstrated. No evidence of tricuspid stenosis. Aortic Valve: The aortic valve is normal in structure. Aortic valve regurgitation is not visualized. No aortic stenosis is present. Pulmonic Valve: The pulmonic valve was normal in structure. Pulmonic valve regurgitation is not visualized. No evidence of pulmonic stenosis. Aorta: The aortic root is normal in size and structure. Venous: The inferior vena cava is normal in size with greater than 50% respiratory variability, suggesting right atrial pressure of 3 mmHg. IAS/Shunts: No atrial level shunt detected by color flow Doppler. Isaias Cowman MD Electronically signed by Isaias Cowman MD Signature Date/Time: 09/30/2021/1:23:57 PM    Final    DG Shoulder Left  Result Date: 09/30/2021 CLINICAL DATA:  Left shoulder pain. EXAM: LEFT SHOULDER - 2+ VIEW COMPARISON:  None Available. FINDINGS: Distal left clavicle fracture as detailed under the left clavicle radiographs. Fracture of the posterolateral left fourth rib, chronicity unclear. Fracture of the posterior second rim that appears recent. No other fractures. Glenohumeral and AC joints are normally aligned. IMPRESSION: 1. Distal left clavicle fracture. 2. Fractures of the posterior left second and posterolateral left fourth ribs, likely both acute/recent. 3. No other fractures.  No dislocation. Electronically Signed   By: Lajean Manes M.D.   On:  09/30/2021 10:43   DG Clavicle Left  Result Date: 09/30/2021 CLINICAL DATA:  Left shoulder pain. EXAM: LEFT CLAVICLE - 2+ VIEWS COMPARISON:  None Available. FINDINGS: Fracture of the distal left clavicle. There is an oblique fracture line extending from the inferior aspect of the distal clavicle, at the level of the coracoclavicular ligament,  distally and superiorly. A transverse fracture component extends across the distal aspect of the clavicle, 9 mm central to the [facility] joint. Distal fracture component is mildly displaced superiorly, by 3-4 mm. No other fractures.  No bone lesions.  AC joint is normally aligned. IMPRESSION: 1. Distal left clavicle fracture as detailed.  No dislocation. Electronically Signed   By: Lajean Manes M.D.   On: 09/30/2021 10:41   DG Chest Port 1 View  Result Date: 09/30/2021 CLINICAL DATA:  Dyspnea left shoulder pain. EXAM: PORTABLE CHEST 1 VIEW COMPARISON:  09/29/2021 FINDINGS: 0837 hours. The cardio pericardial silhouette is enlarged. Vascular congestion with diffuse interstitial opacity suggests edema. Minimal bibasilar atelectasis/infiltrate noted. Bones are diffusely demineralized. Telemetry leads overlie the chest. IMPRESSION: 1. Cardiomegaly with vascular congestion and diffuse interstitial opacity suggesting edema. 2. Minimal bibasilar atelectasis/infiltrate. Electronically Signed   By: Misty Stanley M.D.   On: 09/30/2021 09:21   CT Angio Chest PE W and/or Wo Contrast  Result Date: 09/29/2021 CLINICAL DATA:  Pulmonary embolism (PE) suspected, high prob. Shortness of breath bilateral leg swelling EXAM: CT ANGIOGRAPHY CHEST WITH CONTRAST TECHNIQUE: Multidetector CT imaging of the chest was performed using the standard protocol during bolus administration of intravenous contrast. Multiplanar CT image reconstructions and MIPs were obtained to evaluate the vascular anatomy. RADIATION DOSE REDUCTION: This exam was performed according to the departmental dose-optimization program which includes automated exposure control, adjustment of the mA and/or kV according to patient size and/or use of iterative reconstruction technique. CONTRAST:  59m OMNIPAQUE IOHEXOL 350 MG/ML SOLN COMPARISON:  CT chest 04/23/2017 FINDINGS: Cardiovascular: Satisfactory opacification of the pulmonary arteries to the segmental level. No  evidence of pulmonary embolism. The main pulmonary artery is normal in caliber. Enlarged heart size. No significant pericardial effusion. The thoracic aorta is normal in caliber. Mild atherosclerotic plaque of the thoracic aorta. At least left anterior descending coronary artery calcifications. Mediastinum/Nodes: No enlarged mediastinal, hilar, or axillary lymph nodes. Thyroid gland, trachea, and esophagus demonstrate no significant findings. Lungs/Pleura: Expiratory phase of respiration. Left lower lobe consolidation may represent combination of passive atelectasis versus infection/inflammation. Right lower lobe atelectasis. No pulmonary nodule. No pulmonary mass. Trace left pleural effusion. No pneumothorax. Upper Abdomen: There is a 3.2 cm fluid dense lesion within the right kidney that likely represents a simple renal cyst. Simple renal cysts, in the absence of clinically indicated signs/symptoms, require no independent follow-up. Musculoskeletal: No chest wall abnormality. No suspicious lytic or blastic osseous lesions. New from 2021, likely old healed minimally displaced 2-4 rib fractures. Age-indeterminate, likely acute, left displaced anterior 5-7 rib fractures as well as posteriorly displaced 6-7 rib fractures. No acute displaced fracture. Poorly visualized acute, comminuted, intra-articular left distal clavicular fracture. Review of the MIP images confirms the above findings. IMPRESSION: 1. No pulmonary embolus. 2. Trace left pleural effusion with left lower lobe consolidation may represent combination of passive atelectasis versus infection/inflammation. 3. Cardiomegaly. 4. Likely acute, left displaced anterior 5-7 rib fractures as well as posteriorly displaced 6-7 rib fractures (the 6 and 7 ribs are fractured in 2 different places). New from 2021, likely old healed minimally displaced 2-4 rib fractures. Recommend correlation with point tenderness to  palpation to evaluate for an acute fracture. No  associated pneumothorax. 5. Poorly visualized acute, comminuted, intra-articular left distal clavicular fracture. Recommend dedicated left shoulder and clavicle radiographs. Electronically Signed   By: Iven Finn M.D.   On: 09/29/2021 15:20   DG Chest 2 View  Result Date: 09/29/2021 CLINICAL DATA:  Shortness of breath EXAM: CHEST - 2 VIEW COMPARISON:  01/10/2021 FINDINGS: Chronic interstitial changes. New patchy density at the left lung base. No pleural effusion. No pneumothorax. Similar cardiomediastinal contours. No acute osseous abnormality. IMPRESSION: Patchy left basilar atelectasis/consolidation. Electronically Signed   By: Macy Mis M.D.   On: 09/29/2021 10:39          No data to display          No results found for: "NITRICOXIDE"      Assessment & Plan:   Asthma, mild persistent Mild intermittent asthma under control.  OSA on CPAP Patient has underlying severe sleep apnea.  She has excellent compliance on CPAP.  CPAP download shows excellent control and compliance.  Continue same settings.  Stress-induced cardiomyopathy Recent prolonged hospitalization after traumatic fall last month.  She had multiple rib fractures and clavicular fracture.  This was complicated by acute hypoxic and hypercarbic respiratory failure, stress-induced cardiomyopathy, decompensated congestive heart failure.  Patient was hospitalized for several weeks.  She is slowly starting to improve.  She is to continue physical therapy at home.  Follow-up with cardiology.  Continue on current regimen.  Plan  Patient Instructions  Refer to Dr. Ronnald Ramp PCP for post hospital follow up  Follow up with Cardiology  Home PT as planned.  Continue on CPAP At bedtime  with Oxygen 2l/m  Continue on Oxygen 2l/m with activity , to keep sats >88-90%.  Follow up with Dr. Halford Chessman  or Aydan Levitz NP in 1 month and As needed   Please contact office for sooner follow up if symptoms do not improve or worsen or seek  emergency care       Chronic respiratory failure with hypoxia ([place]) With recent prolonged hospitalization with acute hypoxic and hypercarbic respiratory failure.  Patient is to continue on oxygen to maintain O2 saturations greater than 88 to 98%.  Patient is oxygen bands have decreased since discharge.  Patient is continue on oxygen 2 L with activity and at bedtime with CPAP. We will reevaluate oxygen needs on return visit.  Plan  Patient Instructions  Refer to Dr. Ronnald Ramp PCP for post hospital follow up  Follow up with Cardiology  Home PT as planned.  Continue on CPAP At bedtime  with Oxygen 2l/m  Continue on Oxygen 2l/m with activity , to keep sats >88-90%.  Follow up with Dr. Halford Chessman  or Bernhardt Riemenschneider NP in 1 month and As needed   Please contact office for sooner follow up if symptoms do not improve or worsen or seek emergency care         Rexene Edison, NP 10/17/2021

## 2021-10-17 NOTE — Patient Instructions (Addendum)
Refer to Dr. Ronnald Ramp PCP for post hospital follow up  Follow up with Cardiology  Home PT as planned.  Continue on CPAP At bedtime  with Oxygen 2l/m  Continue on Oxygen 2l/m with activity , to keep sats >88-90%.  Follow up with Dr. Halford Chessman  or Shon Indelicato NP in 1 month and As needed   Please contact office for sooner follow up if symptoms do not improve or worsen or seek emergency care

## 2021-10-17 NOTE — Assessment & Plan Note (Signed)
With recent prolonged hospitalization with acute hypoxic and hypercarbic respiratory failure.  Patient is to continue on oxygen to maintain O2 saturations greater than 88 to 98%.  Patient is oxygen bands have decreased since discharge.  Patient is continue on oxygen 2 L with activity and at bedtime with CPAP. We will reevaluate oxygen needs on return visit.  Plan  Patient Instructions  Refer to Dr. Ronnald Ramp PCP for post hospital follow up  Follow up with Cardiology  Home PT as planned.  Continue on CPAP At bedtime  with Oxygen 2l/m  Continue on Oxygen 2l/m with activity , to keep sats >88-90%.  Follow up with Dr. Halford Chessman  or Kabella Cassidy NP in 1 month and As needed   Please contact office for sooner follow up if symptoms do not improve or worsen or seek emergency care

## 2021-10-17 NOTE — Assessment & Plan Note (Signed)
Mild intermittent asthma under control.

## 2021-10-18 DIAGNOSIS — E119 Type 2 diabetes mellitus without complications: Secondary | ICD-10-CM | POA: Diagnosis not present

## 2021-10-18 DIAGNOSIS — Z23 Encounter for immunization: Secondary | ICD-10-CM | POA: Diagnosis not present

## 2021-10-18 DIAGNOSIS — I5181 Takotsubo syndrome: Secondary | ICD-10-CM | POA: Diagnosis not present

## 2021-10-19 DIAGNOSIS — Z9989 Dependence on other enabling machines and devices: Secondary | ICD-10-CM | POA: Diagnosis not present

## 2021-10-19 DIAGNOSIS — I214 Non-ST elevation (NSTEMI) myocardial infarction: Secondary | ICD-10-CM | POA: Diagnosis not present

## 2021-10-19 DIAGNOSIS — Z7901 Long term (current) use of anticoagulants: Secondary | ICD-10-CM | POA: Diagnosis not present

## 2021-10-19 DIAGNOSIS — G4733 Obstructive sleep apnea (adult) (pediatric): Secondary | ICD-10-CM | POA: Diagnosis not present

## 2021-10-19 DIAGNOSIS — J189 Pneumonia, unspecified organism: Secondary | ICD-10-CM | POA: Diagnosis not present

## 2021-10-19 DIAGNOSIS — I480 Paroxysmal atrial fibrillation: Secondary | ICD-10-CM | POA: Diagnosis not present

## 2021-10-19 DIAGNOSIS — I5181 Takotsubo syndrome: Secondary | ICD-10-CM | POA: Diagnosis not present

## 2021-10-19 DIAGNOSIS — I119 Hypertensive heart disease without heart failure: Secondary | ICD-10-CM | POA: Diagnosis not present

## 2021-10-19 DIAGNOSIS — S2241XD Multiple fractures of ribs, right side, subsequent encounter for fracture with routine healing: Secondary | ICD-10-CM | POA: Diagnosis not present

## 2021-10-19 DIAGNOSIS — S42002D Fracture of unspecified part of left clavicle, subsequent encounter for fracture with routine healing: Secondary | ICD-10-CM | POA: Diagnosis not present

## 2021-11-01 ENCOUNTER — Ambulatory Visit (INDEPENDENT_AMBULATORY_CARE_PROVIDER_SITE_OTHER): Payer: PPO | Admitting: Internal Medicine

## 2021-11-01 ENCOUNTER — Encounter: Payer: Self-pay | Admitting: Internal Medicine

## 2021-11-01 VITALS — BP 118/72 | HR 74 | Temp 98.5°F | Ht 67.0 in | Wt 187.0 lb

## 2021-11-01 DIAGNOSIS — Z23 Encounter for immunization: Secondary | ICD-10-CM | POA: Diagnosis not present

## 2021-11-01 DIAGNOSIS — I10 Essential (primary) hypertension: Secondary | ICD-10-CM | POA: Diagnosis not present

## 2021-11-01 DIAGNOSIS — T502X5A Adverse effect of carbonic-anhydrase inhibitors, benzothiadiazides and other diuretics, initial encounter: Secondary | ICD-10-CM

## 2021-11-01 DIAGNOSIS — E039 Hypothyroidism, unspecified: Secondary | ICD-10-CM

## 2021-11-01 DIAGNOSIS — D509 Iron deficiency anemia, unspecified: Secondary | ICD-10-CM | POA: Insufficient documentation

## 2021-11-01 DIAGNOSIS — E876 Hypokalemia: Secondary | ICD-10-CM

## 2021-11-01 DIAGNOSIS — D510 Vitamin B12 deficiency anemia due to intrinsic factor deficiency: Secondary | ICD-10-CM | POA: Diagnosis not present

## 2021-11-01 DIAGNOSIS — E118 Type 2 diabetes mellitus with unspecified complications: Secondary | ICD-10-CM

## 2021-11-01 DIAGNOSIS — S81801A Unspecified open wound, right lower leg, initial encounter: Secondary | ICD-10-CM | POA: Insufficient documentation

## 2021-11-01 LAB — IBC + FERRITIN
Ferritin: 24 ng/mL (ref 10.0–291.0)
Iron: 46 ug/dL (ref 42–145)
Saturation Ratios: 11.9 % — ABNORMAL LOW (ref 20.0–50.0)
TIBC: 387.8 ug/dL (ref 250.0–450.0)
Transferrin: 277 mg/dL (ref 212.0–360.0)

## 2021-11-01 LAB — CBC WITH DIFFERENTIAL/PLATELET
Basophils Absolute: 0.1 10*3/uL (ref 0.0–0.1)
Basophils Relative: 0.6 % (ref 0.0–3.0)
Eosinophils Absolute: 0.3 10*3/uL (ref 0.0–0.7)
Eosinophils Relative: 3.2 % (ref 0.0–5.0)
HCT: 36.8 % (ref 36.0–46.0)
Hemoglobin: 12.3 g/dL (ref 12.0–15.0)
Lymphocytes Relative: 39.6 % (ref 12.0–46.0)
Lymphs Abs: 3.6 10*3/uL (ref 0.7–4.0)
MCHC: 33.3 g/dL (ref 30.0–36.0)
MCV: 89.3 fl (ref 78.0–100.0)
Monocytes Absolute: 0.6 10*3/uL (ref 0.1–1.0)
Monocytes Relative: 6.1 % (ref 3.0–12.0)
Neutro Abs: 4.6 10*3/uL (ref 1.4–7.7)
Neutrophils Relative %: 50.5 % (ref 43.0–77.0)
Platelets: 257 10*3/uL (ref 150.0–400.0)
RBC: 4.12 Mil/uL (ref 3.87–5.11)
RDW: 13.8 % (ref 11.5–15.5)
WBC: 9.2 10*3/uL (ref 4.0–10.5)

## 2021-11-01 LAB — BASIC METABOLIC PANEL
BUN: 12 mg/dL (ref 6–23)
CO2: 30 mEq/L (ref 19–32)
Calcium: 9.6 mg/dL (ref 8.4–10.5)
Chloride: 101 mEq/L (ref 96–112)
Creatinine, Ser: 0.86 mg/dL (ref 0.40–1.20)
GFR: 71.48 mL/min (ref >60.00)
Glucose, Bld: 129 mg/dL — ABNORMAL HIGH (ref 70–99)
Potassium: 3.2 mEq/L — ABNORMAL LOW (ref 3.5–5.1)
Sodium: 142 mEq/L (ref 135–145)

## 2021-11-01 LAB — VITAMIN B12: Vitamin B-12: 624 pg/mL (ref 211–911)

## 2021-11-01 LAB — FOLATE: Folate: 13.4 ng/mL (ref >5.9)

## 2021-11-01 LAB — TSH: TSH: 0.9 u[IU]/mL (ref 0.35–5.50)

## 2021-11-01 NOTE — Progress Notes (Signed)
Subjective:  Patient ID: Kirsten [name], female    DOB: 09-14-1955  Age: 66 y.o. 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 She has a healing would on her RLE. She denies DOE/CP/edema/palpitations.  She had posttrauma anemia and has been treated with an iron supplement.  Outpatient Medications Prior to Visit  Medication Sig Dispense Refill   amiodarone (PACERONE) 200 MG tablet Take 1 tablet (200 mg total) by mouth daily. 90 tablet 1   atorvastatin (LIPITOR) 40 MG tablet TAKE ONE TABLET BY MOUTH ONE TIME DAILY 90 tablet 1   apixaban (ELIQUIS) 5 MG TABS tablet Take 1 tablet (5 mg total) by mouth 2 (two) times daily. (Patient not taking: Reported on 11/01/2021) 180 tablet 1   Continuous Blood Gluc Sensor (FREESTYLE LIBRE 3 SENSOR) MISC PLACE ONE SENSOR ON THE BACK OF YOUR UPPER ARM EVERY 14 DAYS (Patient not taking: Reported on 11/01/2021) 1 each 5   cyanocobalamin (,VITAMIN B-12,) 1000 MCG/ML injection INJECT 1ML INTO THE MUSCLE ONCE A MONTH (Patient not taking: Reported on 11/01/2021) 10 mL 0   levothyroxine (SYNTHROID) 175 MCG tablet Take 1 tablet (175 mcg total) by mouth daily. 90 tablet 1   losartan (COZAAR) 25 MG tablet Take 1 tablet (25 mg total) by mouth daily. 30 tablet 0   metoprolol succinate (TOPROL-XL) 25 MG 24 hr tablet Take 0.5 tablets (12.5 mg total) by mouth daily. 15 tablet 0   PARoxetine (PAXIL-CR) 25 MG 24 hr tablet TAKE 1 TABLET(25 MG) BY MOUTH DAILY 90 tablet 1   albuterol (VENTOLIN HFA) 108 (90 Base) MCG/ACT inhaler Inhale 2 puffs into the lungs every 6 (six) hours as needed for wheezing or shortness of breath. (Patient not taking: Reported on 11/01/2021) 18 g 2   furosemide (LASIX) 20 MG tablet Take 1 tablet (20 mg total) by mouth daily. (Patient not taking: Reported on 11/01/2021) 30 tablet 0   metFORMIN (GLUCOPHAGE)  1000 MG tablet TAKE ONE TABLET BY MOUTH TWICE A DAY 180 tablet 1   zolpidem (AMBIEN) 10 MG tablet Take 1 tablet (10 mg total) by mouth at bedtime as needed for sleep. (Patient not taking: Reported on 11/01/2021) 90 tablet 0   No facility-administered medications prior to visit.    ROS Review of Systems  Constitutional: Negative.  Negative for diaphoresis and fatigue.  HENT: Negative.    Eyes: Negative.   Respiratory: Negative.  Negative for cough, chest tightness, shortness of breath and wheezing.   Cardiovascular:  Negative for chest pain, palpitations and leg swelling.  Gastrointestinal:  Negative for abdominal pain, constipation, diarrhea, nausea and vomiting.  Endocrine: Negative.   Genitourinary: Negative.  Negative for difficulty urinating and dysuria.  Musculoskeletal:  Negative for back pain.  Skin:  Positive for wound. Negative for color change.  Neurological:  Negative for dizziness, weakness and light-headedness.  Hematological:  Negative for adenopathy. Does not bruise/bleed easily.  Psychiatric/Behavioral: Negative.      Objective:  BP 118/72 (BP Location: Right Arm, Patient Position: Sitting, Cuff Size: Normal)   Pulse 74   Temp 98.5 F (36.9 C) (Oral)   Ht 5' 7"  (1.702 m)   Wt 187 lb (84.8 kg)   SpO2 98%   BMI 29.29 kg/m   BP Readings from Last 3 Encounters:  11/01/21 118/72  10/17/21 120/64  10/06/21 (!) 98/57  Wt Readings from Last 3 Encounters:  11/01/21 187 lb (84.8 kg)  10/17/21 185 lb 6.4 oz (84.1 kg)  10/03/21 200 lb 8 oz (90.9 kg)    Physical Exam Vitals reviewed.  HENT:     Mouth/Throat:     Mouth: Mucous membranes are moist.  Eyes:     General: No scleral icterus.    Conjunctiva/sclera: Conjunctivae normal.  Cardiovascular:     Rate and Rhythm: Normal rate and regular rhythm.     Heart sounds: No murmur heard. Pulmonary:     Effort: Pulmonary effort is normal.     Breath sounds: No stridor. No wheezing, rhonchi or rales.   Abdominal:     General: Abdomen is flat.     Palpations: There is no mass.     Tenderness: There is no abdominal tenderness. There is no guarding.     Hernia: No hernia is present.  Musculoskeletal:        General: Normal range of motion.     Cervical back: Neck supple.     Right lower leg: No edema.     Left lower leg: No edema.  Lymphadenopathy:     Cervical: No cervical adenopathy.  Skin:    General: Skin is warm and dry.     Findings: No rash.  Neurological:     General: No focal deficit present.     Mental Status: She is alert.  Psychiatric:        Mood and Affect: Mood normal.        Behavior: Behavior normal.     Lab Results  Component Value Date   WBC 9.2 11/01/2021   HGB 12.3 11/01/2021   HCT 36.8 11/01/2021   PLT 257.0 11/01/2021   GLUCOSE 129 (H) 11/01/2021   CHOL 83 09/30/2021   TRIG 70 09/30/2021   HDL 37 (L) 09/30/2021   LDLDIRECT 63.0 01/15/2019   LDLCALC 32 09/30/2021   ALT 51 (H) 09/29/2021   AST 51 (H) 09/29/2021   NA 142 11/01/2021   K 3.2 (L) 11/01/2021   CL 101 11/01/2021   CREATININE 0.86 11/01/2021   BUN 12 11/01/2021   CO2 30 11/01/2021   TSH 0.90 11/01/2021   INR 1.4 (H) 09/29/2021   HGBA1C 6.0 11/02/2021   MICROALBUR 0.8 03/22/2021    ECHOCARDIOGRAM COMPLETE  Result Date: 09/30/2021    ECHOCARDIOGRAM REPORT   Patient Name:   Kirsten [name] Date of Exam: 09/30/2021 Medical Rec #:  [id number]        Height:       67.0 in Accession #:    [id number]       Weight:       192.0 lb Date of Birth:  02-13-1955        BSA:          1.988 m Patient Age:    63 years         BP:           102/58 mmHg Patient Gender: F                HR:           90 bpm. Exam Location:  [facility] Procedure: 2D Echo and Intracardiac Opacification Agent Indications:     NSTEMI I21.4  History:         Patient has prior history of Echocardiogram examinations, most                  recent  12/12/2020.  Sonographer:     Kathlen Brunswick RDCS Referring Phys:  [id number] [place]  TANG Diagnosing Phys: Isaias Cowman MD  Sonographer Comments: Technically challenging study due to limited acoustic windows, no parasternal window, no apical window and no subcostal window. The patient was on Bipap at the time of this study. There were no acoustic windows obtainable on this study. Definity IV ultrasound imaging agent attempted to enhance endocardial definition. IMPRESSIONS  1. Left ventricular ejection fraction, by estimation, is 55 to 60%. The left ventricle has normal function. The left ventricle has no regional wall motion abnormalities. Left ventricular diastolic parameters are indeterminate.  2. Right ventricular systolic function is normal. The right ventricular size is normal.  3. The mitral valve is normal in structure. No evidence of mitral valve regurgitation. No evidence of mitral stenosis.  4. The aortic valve is normal in structure. Aortic valve regurgitation is not visualized. No aortic stenosis is present.  5. The inferior vena cava is normal in size with greater than 50% respiratory variability, suggesting right atrial pressure of 3 mmHg. FINDINGS  Left Ventricle: Left ventricular ejection fraction, by estimation, is 55 to 60%. The left ventricle has normal function. The left ventricle has no regional wall motion abnormalities. Definity contrast agent was given IV to delineate the left ventricular  endocardial borders. The left ventricular internal cavity size was normal in size. There is no left ventricular hypertrophy. Left ventricular diastolic parameters are indeterminate. Right Ventricle: The right ventricular size is normal. No increase in right ventricular wall thickness. Right ventricular systolic function is normal. Left Atrium: Left atrial size was normal in size. Right Atrium: Right atrial size was normal in size. Pericardium: There is no evidence of pericardial effusion. Mitral Valve: The mitral valve is normal in structure. No evidence of mitral valve  regurgitation. No evidence of mitral valve stenosis. Tricuspid Valve: The tricuspid valve is normal in structure. Tricuspid valve regurgitation is not demonstrated. No evidence of tricuspid stenosis. Aortic Valve: The aortic valve is normal in structure. Aortic valve regurgitation is not visualized. No aortic stenosis is present. Pulmonic Valve: The pulmonic valve was normal in structure. Pulmonic valve regurgitation is not visualized. No evidence of pulmonic stenosis. Aorta: The aortic root is normal in size and structure. Venous: The inferior vena cava is normal in size with greater than 50% respiratory variability, suggesting right atrial pressure of 3 mmHg. IAS/Shunts: No atrial level shunt detected by color flow Doppler. Isaias Cowman MD Electronically signed by Isaias Cowman MD Signature Date/Time: 09/30/2021/1:23:57 PM    Final    DG Shoulder Left  Result Date: 09/30/2021 CLINICAL DATA:  Left shoulder pain. EXAM: LEFT SHOULDER - 2+ VIEW COMPARISON:  None Available. FINDINGS: Distal left clavicle fracture as detailed under the left clavicle radiographs. Fracture of the posterolateral left fourth rib, chronicity unclear. Fracture of the posterior second rim that appears recent. No other fractures. Glenohumeral and AC joints are normally aligned. IMPRESSION: 1. Distal left clavicle fracture. 2. Fractures of the posterior left second and posterolateral left fourth ribs, likely both acute/recent. 3. No other fractures.  No dislocation. Electronically Signed   By: Lajean Manes M.D.   On: 09/30/2021 10:43   DG Clavicle Left  Result Date: 09/30/2021 CLINICAL DATA:  Left shoulder pain. EXAM: LEFT CLAVICLE - 2+ VIEWS COMPARISON:  None Available. FINDINGS: Fracture of the distal left clavicle. There is an oblique fracture line extending from the inferior aspect of the distal clavicle, at the level of the coracoclavicular ligament,  distally and superiorly. A transverse fracture component extends  across the distal aspect of the clavicle, 9 mm central to the [facility] joint. Distal fracture component is mildly displaced superiorly, by 3-4 mm. No other fractures.  No bone lesions.  AC joint is normally aligned. IMPRESSION: 1. Distal left clavicle fracture as detailed.  No dislocation. Electronically Signed   By: Lajean Manes M.D.   On: 09/30/2021 10:41   DG Chest Port 1 View  Result Date: 09/30/2021 CLINICAL DATA:  Dyspnea left shoulder pain. EXAM: PORTABLE CHEST 1 VIEW COMPARISON:  09/29/2021 FINDINGS: 0837 hours. The cardio pericardial silhouette is enlarged. Vascular congestion with diffuse interstitial opacity suggests edema. Minimal bibasilar atelectasis/infiltrate noted. Bones are diffusely demineralized. Telemetry leads overlie the chest. IMPRESSION: 1. Cardiomegaly with vascular congestion and diffuse interstitial opacity suggesting edema. 2. Minimal bibasilar atelectasis/infiltrate. Electronically Signed   By: Misty Stanley M.D.   On: 09/30/2021 09:21    Assessment & Plan:   Kirsten [name] was seen today for diabetes, hypothyroidism, hypertension and anemia.  Diagnoses and all orders for this visit:  Vitamin B12 deficiency anemia due to intrinsic factor deficiency- Her H&H are normal. -     Vitamin B12; Future -     CBC with Differential/Platelet; Future -     Folate; Future -     Folate -     CBC with Differential/Platelet -     Vitamin B12  Iron deficiency anemia, unspecified iron deficiency anemia type- She is no longer anemic and her iron level is normal. -     IBC + Ferritin; Future -     CBC with Differential/Platelet; Future -     CBC with Differential/Platelet -     IBC + Ferritin  Essential hypertension, benign- Her blood pressure is well controlled. -     Basic metabolic panel; Future -     Basic metabolic panel -     potassium chloride SA (KLOR-CON M15) 15 MEQ tablet; Take 1 tablet (15 mEq total) by mouth 2 (two) times daily.  Hypothyroidism, unspecified type- She is  euthyroid. -     TSH; Future -     TSH  Type II diabetes mellitus with manifestations (Kirsten [name])- Her A1c is down to 6.0%.  I recommended that she stop taking metformin. -     Basic metabolic panel; Future -     Hemoglobin A1c; Future -     Hemoglobin A1c -     Basic metabolic panel  Open wound of right lower leg, initial encounter -     Td vaccine greater than or equal to 7yo preservative free IM  Flu vaccine need -     Flu Vaccine QUAD 6+ mos PF IM (Fluarix Quad PF)  Diuretic-induced hypokalemia -     potassium chloride SA (KLOR-CON M15) 15 MEQ tablet; Take 1 tablet (15 mEq total) by mouth 2 (two) times daily.   I have discontinued Kirsten [name]'s albuterol, metFORMIN, zolpidem, and furosemide. I am also having her start on potassium chloride SA. Additionally, I am having her maintain her amiodarone, PARoxetine, apixaban, atorvastatin, levothyroxine, cyanocobalamin, FreeStyle Libre 3 Sensor, losartan, and metoprolol succinate.  Meds ordered this encounter  Medications   potassium chloride SA (KLOR-CON M15) 15 MEQ tablet    Sig: Take 1 tablet (15 mEq total) by mouth 2 (two) times daily.    Dispense:  180 tablet    Refill:  0     Follow-up: Return in about 3 months (around 01/31/2022).  [name],  MD

## 2021-11-01 NOTE — Patient Instructions (Signed)

## 2021-11-02 ENCOUNTER — Other Ambulatory Visit (INDEPENDENT_AMBULATORY_CARE_PROVIDER_SITE_OTHER): Payer: PPO

## 2021-11-02 DIAGNOSIS — E118 Type 2 diabetes mellitus with unspecified complications: Secondary | ICD-10-CM | POA: Diagnosis not present

## 2021-11-02 DIAGNOSIS — T502X5A Adverse effect of carbonic-anhydrase inhibitors, benzothiadiazides and other diuretics, initial encounter: Secondary | ICD-10-CM | POA: Insufficient documentation

## 2021-11-02 DIAGNOSIS — Z23 Encounter for immunization: Secondary | ICD-10-CM | POA: Insufficient documentation

## 2021-11-02 MED ORDER — POTASSIUM CHLORIDE CRYS ER 15 MEQ PO TBCR: 15.0000 meq | EXTENDED_RELEASE_TABLET | Freq: Two times a day (BID) | ORAL | 0 refills | Status: DC

## 2021-11-06 ENCOUNTER — Other Ambulatory Visit: Payer: Self-pay | Admitting: Internal Medicine

## 2021-11-06 ENCOUNTER — Encounter: Payer: Self-pay | Admitting: Internal Medicine

## 2021-11-06 DIAGNOSIS — E039 Hypothyroidism, unspecified: Secondary | ICD-10-CM

## 2021-11-06 DIAGNOSIS — E7849 Other hyperlipidemia: Secondary | ICD-10-CM

## 2021-11-06 DIAGNOSIS — J189 Pneumonia, unspecified organism: Secondary | ICD-10-CM

## 2021-11-06 DIAGNOSIS — I119 Hypertensive heart disease without heart failure: Secondary | ICD-10-CM

## 2021-11-06 DIAGNOSIS — Z7901 Long term (current) use of anticoagulants: Secondary | ICD-10-CM

## 2021-11-06 DIAGNOSIS — Z9989 Dependence on other enabling machines and devices: Secondary | ICD-10-CM

## 2021-11-06 DIAGNOSIS — I480 Paroxysmal atrial fibrillation: Secondary | ICD-10-CM

## 2021-11-06 DIAGNOSIS — S42002D Fracture of unspecified part of left clavicle, subsequent encounter for fracture with routine healing: Secondary | ICD-10-CM

## 2021-11-06 DIAGNOSIS — J45909 Unspecified asthma, uncomplicated: Secondary | ICD-10-CM

## 2021-11-06 DIAGNOSIS — F419 Anxiety disorder, unspecified: Secondary | ICD-10-CM

## 2021-11-06 DIAGNOSIS — Z9981 Dependence on supplemental oxygen: Secondary | ICD-10-CM

## 2021-11-06 DIAGNOSIS — G4733 Obstructive sleep apnea (adult) (pediatric): Secondary | ICD-10-CM

## 2021-11-06 DIAGNOSIS — S2241XD Multiple fractures of ribs, right side, subsequent encounter for fracture with routine healing: Secondary | ICD-10-CM

## 2021-11-06 DIAGNOSIS — F32A Depression, unspecified: Secondary | ICD-10-CM

## 2021-11-06 DIAGNOSIS — Z7984 Long term (current) use of oral hypoglycemic drugs: Secondary | ICD-10-CM

## 2021-11-06 DIAGNOSIS — K219 Gastro-esophageal reflux disease without esophagitis: Secondary | ICD-10-CM

## 2021-11-06 DIAGNOSIS — I214 Non-ST elevation (NSTEMI) myocardial infarction: Secondary | ICD-10-CM

## 2021-11-06 DIAGNOSIS — I5181 Takotsubo syndrome: Secondary | ICD-10-CM

## 2021-11-06 DIAGNOSIS — K589 Irritable bowel syndrome without diarrhea: Secondary | ICD-10-CM

## 2021-11-06 DIAGNOSIS — Z6829 Body mass index (BMI) 29.0-29.9, adult: Secondary | ICD-10-CM

## 2021-11-06 DIAGNOSIS — E119 Type 2 diabetes mellitus without complications: Secondary | ICD-10-CM

## 2021-11-06 LAB — HEMOGLOBIN A1C: Hgb A1c MFr Bld: 6 % (ref 4.6–6.5)

## 2021-11-11 DIAGNOSIS — J189 Pneumonia, unspecified organism: Secondary | ICD-10-CM | POA: Diagnosis not present

## 2021-11-11 DIAGNOSIS — J8410 Pulmonary fibrosis, unspecified: Secondary | ICD-10-CM | POA: Diagnosis not present

## 2021-11-11 DIAGNOSIS — I5181 Takotsubo syndrome: Secondary | ICD-10-CM | POA: Diagnosis not present

## 2021-11-13 DIAGNOSIS — M5442 Lumbago with sciatica, left side: Secondary | ICD-10-CM | POA: Diagnosis not present

## 2021-11-13 DIAGNOSIS — S42035D Nondisplaced fracture of lateral end of left clavicle, subsequent encounter for fracture with routine healing: Secondary | ICD-10-CM | POA: Diagnosis not present

## 2021-11-13 DIAGNOSIS — G8929 Other chronic pain: Secondary | ICD-10-CM | POA: Diagnosis not present

## 2021-11-13 DIAGNOSIS — G5712 Meralgia paresthetica, left lower limb: Secondary | ICD-10-CM | POA: Diagnosis not present

## 2021-11-13 DIAGNOSIS — M5441 Lumbago with sciatica, right side: Secondary | ICD-10-CM | POA: Diagnosis not present

## 2021-11-13 DIAGNOSIS — R200 Anesthesia of skin: Secondary | ICD-10-CM | POA: Diagnosis not present

## 2021-11-13 NOTE — Telephone Encounter (Signed)
That is fine , we were waiting to make sure she did not desat . She has an appointment later this month to evaluate for this .  Explained that hypoxia puts stress on the heart -recent admission for Cardiomyopathy.   Can d/c O2 if  she adamant about not wearing . Can revaluate on return ov this month   Please contact office for sooner follow up if symptoms do not improve or worsen or seek emergency care

## 2021-11-13 NOTE — Telephone Encounter (Signed)
Tammy, please advise. Thanks 

## 2021-11-14 DIAGNOSIS — G4733 Obstructive sleep apnea (adult) (pediatric): Secondary | ICD-10-CM | POA: Diagnosis not present

## 2021-11-17 DIAGNOSIS — I214 Non-ST elevation (NSTEMI) myocardial infarction: Secondary | ICD-10-CM | POA: Diagnosis not present

## 2021-11-17 DIAGNOSIS — I480 Paroxysmal atrial fibrillation: Secondary | ICD-10-CM | POA: Diagnosis not present

## 2021-11-17 DIAGNOSIS — Z7901 Long term (current) use of anticoagulants: Secondary | ICD-10-CM | POA: Diagnosis not present

## 2021-11-17 DIAGNOSIS — Z9989 Dependence on other enabling machines and devices: Secondary | ICD-10-CM | POA: Diagnosis not present

## 2021-11-17 DIAGNOSIS — I119 Hypertensive heart disease without heart failure: Secondary | ICD-10-CM | POA: Diagnosis not present

## 2021-11-17 DIAGNOSIS — G4733 Obstructive sleep apnea (adult) (pediatric): Secondary | ICD-10-CM | POA: Diagnosis not present

## 2021-11-17 DIAGNOSIS — S42002D Fracture of unspecified part of left clavicle, subsequent encounter for fracture with routine healing: Secondary | ICD-10-CM | POA: Diagnosis not present

## 2021-11-17 DIAGNOSIS — S2241XD Multiple fractures of ribs, right side, subsequent encounter for fracture with routine healing: Secondary | ICD-10-CM | POA: Diagnosis not present

## 2021-11-17 DIAGNOSIS — I5181 Takotsubo syndrome: Secondary | ICD-10-CM | POA: Diagnosis not present

## 2021-11-17 DIAGNOSIS — J189 Pneumonia, unspecified organism: Secondary | ICD-10-CM | POA: Diagnosis not present

## 2021-11-22 DIAGNOSIS — Z6830 Body mass index (BMI) 30.0-30.9, adult: Secondary | ICD-10-CM | POA: Diagnosis not present

## 2021-11-22 DIAGNOSIS — M5416 Radiculopathy, lumbar region: Secondary | ICD-10-CM | POA: Diagnosis not present

## 2021-11-23 ENCOUNTER — Ambulatory Visit: Payer: Self-pay | Admitting: Licensed Clinical Social Worker

## 2021-11-23 NOTE — Patient Instructions (Signed)
Visit Information  Thank you for taking time to speak with me today. Please don't hesitate to contact me if I can be of assistance to you.   What we discussed today:   Goals Addressed             This Visit's Progress    COMPLETED: Care Coordination Activities No Follow up Required       Care Coordination Interventions: Reviewed Care Coordination Services:Declined Discussed benefits of Medicare Annual Wellness Visit: Declined             Patient verbalizes understanding of instructions and care plan provided today and agrees to view in [place]. Active MyChart status and patient understanding of how to access instructions and care plan via MyChart confirmed with patient.     No further follow up required: by [place], [place] [phone]    Care Coordination team works in collaboration with your primary care doctor.  Please call [phone] if you would like to schedule a phone appointment   1.The Care Coordination services include support from the care team which includes a Nurse Coordinator, Clinical Social Worker, or Pharmacist, to assist with navigating your physical and mental health needs. Marland Kitchen  2.The Care Coordination team is here to help remove barriers to health concerns and goals most important to you.  3.Care Coordination services are voluntary, you may decline or stop services at any time by request to the care team member

## 2021-11-23 NOTE — Patient Outreach (Signed)
  Care Coordination  Initial Visit Note   11/23/2021 Name: MARAL LAMPE MRN: [id number] DOB: Apr 06, 1955  [name] is a 66 y.o. year old female who sees [name], MD for primary care. I spoke with  [name] by phone today.  What matters to the patients health and wellness today?   Declined all services    Goals Addressed             This Visit's Progress    COMPLETED: Care Coordination Activities No Follow up Required       Care Coordination Interventions: Reviewed Care Coordination Services:Declined Discussed benefits of Medicare Annual Wellness Visit: Declined             SDOH assessments and interventions completed:  No     Care Coordination Interventions Activated:  No  Care Coordination Interventions:  Yes, provided   Follow up plan: No further intervention required.   Encounter Outcome:  Pt. Visit Completed   Casimer Lanius, [place] [phone]

## 2021-12-05 ENCOUNTER — Ambulatory Visit
Admission: RE | Admit: 2021-12-05 | Discharge: 2021-12-05 | Disposition: A | Discharge status: Home / Self Care | Payer: PPO | Source: Ambulatory Visit | Attending: Adult Health | Admitting: Adult Health

## 2021-12-05 ENCOUNTER — Ambulatory Visit (INDEPENDENT_AMBULATORY_CARE_PROVIDER_SITE_OTHER): Payer: PPO | Admitting: Adult Health

## 2021-12-05 ENCOUNTER — Encounter: Payer: Self-pay | Admitting: Adult Health

## 2021-12-05 VITALS — BP 124/78 | HR 86 | Temp 97.8°F | Ht 67.0 in | Wt 189.0 lb

## 2021-12-05 DIAGNOSIS — J9611 Chronic respiratory failure with hypoxia: Secondary | ICD-10-CM | POA: Diagnosis not present

## 2021-12-05 DIAGNOSIS — J189 Pneumonia, unspecified organism: Secondary | ICD-10-CM | POA: Diagnosis not present

## 2021-12-05 DIAGNOSIS — J4530 Mild persistent asthma, uncomplicated: Secondary | ICD-10-CM

## 2021-12-05 DIAGNOSIS — R5381 Other malaise: Secondary | ICD-10-CM | POA: Insufficient documentation

## 2021-12-05 DIAGNOSIS — I5181 Takotsubo syndrome: Secondary | ICD-10-CM

## 2021-12-05 DIAGNOSIS — G4733 Obstructive sleep apnea (adult) (pediatric): Secondary | ICD-10-CM

## 2021-12-05 DIAGNOSIS — J9811 Atelectasis: Secondary | ICD-10-CM | POA: Diagnosis not present

## 2021-12-05 NOTE — Progress Notes (Signed)
@Patient  ID: Kirsten [name], female    DOB: Mar 03, 1955, 66 y.o.   MRN: [id number]  Chief Complaint  Patient presents with   Follow-up    Referring provider: [name], MD  HPI: 66 year old female never smoker followed for sleep apnea and asthma Medical history significant for diastolic heart failure, atrial fibrillation on amiodarone and Eliquis and diabetes  TEST/EVENTS :  PFT 09/20/09 >> FEV1 1.55 (57%), FEV1% 83, TLC 3.00 (54%), DLCO 58% RAST 11/02/15 >> negative IgE 10/28/20 >> 3   Sleep Tests:  PSG 07/22/08 >> AHI 80.4, SpO2 low 82% CPAP 02/22/21 to 02/27/21 >> used on 6 of 6 nights with average 7 hrs 3 min.  Average AHI 0.7 with CPAP 8 cm H2O   Cardiac Tests:  Echo 12/12/20 >> EF greater than 55%, mild LVH, grade 2 DD  12/05/2021 Follow up : Asthma, OSA  Patient presents for a 6-week follow-up.  Patient was seen last visit for a posthospital follow-up.  Patient was recently admitted in August for stress-induced cardiomyopathy.  She was traveling to [place] and suffered a traumatic fall.  She had multiple left-sided rib fractures and a left clavicular fracture.  She was hospitalized in [place] for several days and hospital stay was complicated by hypercarbic respiratory failure.  CT chest was negative for PE.  Cardiac cath was negative for nonobstructive coronary disease.  Echo showed notable apical akinesis and stress cardiomyopathy with a decreased EF at 35 to 45%.  She was treated for a pneumonia.  CT chest showed a trace left pleural effusion and left lower lobe consolidation with left-sided multiple rib fractures and clavicular fracture. She required diuresis for volume overload.  She was started on o2 2l/m with activity and At bedtime  at discharge.  Since last visit patient says she is feeling better.  Energy level is starting to pick up.  She has completed PT at home. Wants to go to [facility]. Insurance would not cover cardiac rehab.  Walk test in the office today shows  no desaturations on room air.  Maintain O2 saturations 93% and above.  Patient has obstructive sleep apnea and is on CPAP at bedtime.  Patient says she wears her CPAP every single night cannot sleep without it. Says she benefits from CPAP.  CPAP download shows excellent 100% compliance with daily average usage at 9.5 hours.  Patient is on CPAP at 8 cm H2O.  AHI 0.7.  Allergies  Allergen Reactions   Doxycycline Rash   Factive [Gemifloxacin Mesylate] Rash   Crestor [Rosuvastatin]     GI upset   Sulfonamide Derivatives     REACTION: rash   Gemifloxacin Rash   Penicillins Hives and Rash    Has patient had a PCN reaction causing immediate rash, facial/tongue/throat swelling, SOB or lightheadedness with hypotension: No Has patient had a PCN reaction causing severe rash involving mucus membranes or skin necrosis: No Has patient had a PCN reaction that required hospitalization No Has patient had a PCN reaction occurring within the last 10 years: No If all of the above answers are "NO", then may proceed with Cephalosporin use.     Immunization History  Administered Date(s) Administered   Influenza Split 11/29/2010, 11/23/2011   Influenza Whole 11/12/2009   Influenza,inj,Quad PF,6+ Mos 10/22/2012, 12/31/2013, 11/02/2014, 11/28/2015, 11/23/2016, 12/25/2017, 11/03/2018, 11/16/2019, 12/20/2020, 11/01/2021   PFIZER(Purple Top)SARS-COV-2 Vaccination 04/25/2019, 05/20/2019, 02/08/2020   PNEUMOCOCCAL CONJUGATE-20 12/20/2020   Pneumococcal Polysaccharide-23 11/23/2011, 11/16/2019   Td 11/01/2021   Tdap 08/02/2010  Past Medical History:  Diagnosis Date   A-fib ([place])    Anxiety disorder    Asthma    Dyspnea    Gastroschisis    umphalocele, rotated organs abd repair until age 60   GERD (gastroesophageal reflux disease)    HTN (hypertension)    Hyperlipidemia    IBS (irritable bowel syndrome)    Morbid obesity (HCC)    Target wt - 185  for BMI < 30   Obesity    OSA on CPAP    SBO (small  bowel obstruction) ([place])    Resolved with NG/Bowel rest around 2009   Sleep apnea    Type II or unspecified type diabetes mellitus without mention of complication, not stated as uncontrolled     Tobacco History: Social History   Tobacco Use  Smoking Status Never  Smokeless Tobacco Never  Tobacco Comments   "tried as a teen"   Counseling given: Not Answered Tobacco comments: "tried as a teen"   Outpatient Medications Prior to Visit  Medication Sig Dispense Refill   amiodarone (PACERONE) 200 MG tablet Take 1 tablet (200 mg total) by mouth daily. 90 tablet 1   apixaban (ELIQUIS) 5 MG TABS tablet Take 1 tablet (5 mg total) by mouth 2 (two) times daily. 180 tablet 1   atorvastatin (LIPITOR) 40 MG tablet TAKE ONE TABLET BY MOUTH ONE TIME DAILY 90 tablet 1   Continuous Blood Gluc Sensor (FREESTYLE LIBRE 3 SENSOR) MISC PLACE ONE SENSOR ON THE BACK OF YOUR UPPER ARM EVERY 14 DAYS 1 each 5   cyanocobalamin (,VITAMIN B-12,) 1000 MCG/ML injection INJECT 1ML INTO THE MUSCLE ONCE A MONTH 10 mL 0   levothyroxine (SYNTHROID) 175 MCG tablet Take 1 tablet (175 mcg total) by mouth daily. 90 tablet 1   PARoxetine (PAXIL-CR) 25 MG 24 hr tablet TAKE 1 TABLET(25 MG) BY MOUTH DAILY 90 tablet 1   potassium chloride SA (KLOR-CON M15) 15 MEQ tablet Take 1 tablet (15 mEq total) by mouth 2 (two) times daily. 180 tablet 0   furosemide (LASIX) 20 MG tablet      losartan (COZAAR) 25 MG tablet Take 1 tablet (25 mg total) by mouth daily. 30 tablet 0   metoprolol succinate (TOPROL-XL) 25 MG 24 hr tablet Take 0.5 tablets (12.5 mg total) by mouth daily. 15 tablet 0   No facility-administered medications prior to visit.     Review of Systems:   Constitutional:   No  weight loss, night sweats,  Fevers, chills,  +fatigue, or  lassitude.  HEENT:   No headaches,  Difficulty swallowing,  Tooth/dental problems, or  Sore throat,                No sneezing, itching, ear ache, nasal congestion, post nasal drip,   CV:   No chest pain,  Orthopnea, PND, swelling in lower extremities, anasarca, dizziness, palpitations, syncope.   GI  No heartburn, indigestion, abdominal pain, nausea, vomiting, diarrhea, change in bowel habits, loss of appetite, bloody stools.   Resp: No shortness of breath with exertion or at rest.  No excess mucus, no productive cough,  No non-productive cough,  No coughing up of blood.  No change in color of mucus.  No wheezing.  No chest wall deformity  Skin: no rash or lesions.  GU: no dysuria, change in color of urine, no urgency or frequency.  No flank pain, no hematuria   MS:  +rib pain     Physical Exam  BP 124/78 (BP  Location: Left Arm, Cuff Size: Normal)   Pulse 86   Temp 97.8 F (36.6 C) (Temporal)   Ht 5' 7"  (1.702 m)   Wt 189 lb (85.7 kg)   SpO2 95%   BMI 29.60 kg/m   GEN: A/Ox3; pleasant , NAD, well nourished    HEENT:  [place]/AT,  EACs-clear, TMs-wnl, NOSE-clear, THROAT-clear, no lesions, no postnasal drip or exudate noted.   NECK:  Supple w/ fair ROM; no JVD; normal carotid impulses w/o bruits; no thyromegaly or nodules palpated; no lymphadenopathy.    RESP  Clear  P & A; w/o, wheezes/ rales/ or rhonchi. no accessory muscle use, no dullness to percussion  CARD:  RRR, no m/r/g, no peripheral edema, pulses intact, no cyanosis or clubbing.  GI:   Soft & nt; nml bowel sounds; no organomegaly or masses detected.   Musco: Warm bil, no deformities or joint swelling noted.   Neuro: alert, no focal deficits noted.    Skin: Warm, no lesions or rashes    Lab Results:    BMET     Imaging: No results found.        No data to display          No results found for: "NITRICOXIDE"      Assessment & Plan:   OSA on CPAP Excellent control and compliance on CPAP.  Continue current settings  Plan  Patient Instructions  Continue on CPAP At bedtime  Work on healthy weight  Do not drive if sleepy   May discontinue Oxygen.   Refer to [facility] .    Chest xray today   Albuterol inhaler As needed    Follow up with Dr. Halford Chessman  or Andre Gallego NP in 6 month and As needed   Please contact office for sooner follow up if symptoms do not improve or worsen or seek emergency care      Stress-induced cardiomyopathy Appears euvolemic on exam.  Continue follow-up with cardiology  Asthma, mild persistent Mild intermittent asthma under good control.  Continue albuterol as needed.  Asthma action plan discussed  Chronic respiratory failure with hypoxia (HCC) No further desaturations with ambulation.  O2 saturations maintained at 93% or above on room air walking today in the office.  She may discontinue oxygen use at home  Physical deconditioning Has physical deconditioning from recent prolonged illness.  Recovering from stress-induced cardiomyopathy. Insurance would not cover cardiac rehab as recommended by cardiology.  Will refer to the [facility] center here at [facility].  Advance activity as tolerated.  CAP (community acquired pneumonia) Community-acquired pneumonia with left lower lobe consolidation with recent hospitalization treated with antibiotics.  Clinically patient appears to be improved.  We will check chest x-ray for total resolution     Rexene Edison, NP 12/05/2021

## 2021-12-05 NOTE — Assessment & Plan Note (Addendum)
Has physical deconditioning from recent prolonged illness.  Recovering from stress-induced cardiomyopathy. Insurance would not cover cardiac rehab as recommended by cardiology.  Will refer to the [facility] center here at [facility].  Advance activity as tolerated.

## 2021-12-05 NOTE — Assessment & Plan Note (Signed)
Appears euvolemic on exam.  Continue follow-up with cardiology

## 2021-12-05 NOTE — Addendum Note (Signed)
Addended by: Claudette Head A on: 12/05/2021 03:34 PM   Modules accepted: Orders

## 2021-12-05 NOTE — Assessment & Plan Note (Signed)
No further desaturations with ambulation.  O2 saturations maintained at 93% or above on room air walking today in the office.  She may discontinue oxygen use at home

## 2021-12-05 NOTE — Progress Notes (Signed)
Reviewed and agree with assessment/plan.   Chesley Mires, MD [facility] Pulmonary/Critical Care 12/05/2021, 3:31 PM Pager:  [phone]

## 2021-12-05 NOTE — Assessment & Plan Note (Signed)
Community-acquired pneumonia with left lower lobe consolidation with recent hospitalization treated with antibiotics.  Clinically patient appears to be improved.  We will check chest x-ray for total resolution

## 2021-12-05 NOTE — Assessment & Plan Note (Signed)
Mild intermittent asthma under good control.  Continue albuterol as needed.  Asthma action plan discussed

## 2021-12-05 NOTE — Patient Instructions (Addendum)
Continue on CPAP At bedtime  Work on healthy weight  Do not drive if sleepy   May discontinue Oxygen.   Refer to [facility] .   Chest xray today   Albuterol inhaler As needed    Follow up with Dr. Halford Chessman  or Benita Boonstra NP in 6 month and As needed   Please contact office for sooner follow up if symptoms do not improve or worsen or seek emergency care

## 2021-12-05 NOTE — Assessment & Plan Note (Signed)
Excellent control and compliance on CPAP.  Continue current settings  Plan  Patient Instructions  Continue on CPAP At bedtime  Work on healthy weight  Do not drive if sleepy   May discontinue Oxygen.   Refer to [facility] .   Chest xray today   Albuterol inhaler As needed    Follow up with Dr. Halford Chessman  or Diedra Sinor NP in 6 month and As needed   Please contact office for sooner follow up if symptoms do not improve or worsen or seek emergency care

## 2021-12-06 ENCOUNTER — Telehealth: Payer: Self-pay | Admitting: Adult Health

## 2021-12-06 NOTE — Telephone Encounter (Signed)
Order was placed to [employer] on 12/05/2021 to d/c oxygen.  Patient stated that [employer] says that they did not receive an order and patient would have to sign AMA form.  Rodena Piety, can you help with this?

## 2021-12-06 NOTE — Telephone Encounter (Signed)
Urgent CM sent to [employer] for them to check on this issue

## 2021-12-06 NOTE — Telephone Encounter (Signed)
Routing to [employer].

## 2021-12-08 NOTE — Telephone Encounter (Signed)
I sent another CM to [employer] asking if this has been taken care of.  I got response from [facility] , I show the O2 has already been picked up."

## 2021-12-08 NOTE — Telephone Encounter (Signed)
Kirsten Piety, do you have an update on this? Thanks

## 2021-12-08 NOTE — Telephone Encounter (Signed)
Noted  

## 2021-12-11 NOTE — Progress Notes (Signed)
Called and spoke with patient, provided results/recommendations per Roxan Diesel NP.  She verbalized understanding.  Nothing further needed.

## 2021-12-15 DIAGNOSIS — G4733 Obstructive sleep apnea (adult) (pediatric): Secondary | ICD-10-CM | POA: Diagnosis not present

## 2021-12-18 DIAGNOSIS — M19012 Primary osteoarthritis, left shoulder: Secondary | ICD-10-CM | POA: Diagnosis not present

## 2021-12-18 DIAGNOSIS — S42035D Nondisplaced fracture of lateral end of left clavicle, subsequent encounter for fracture with routine healing: Secondary | ICD-10-CM | POA: Diagnosis not present

## 2021-12-18 DIAGNOSIS — M25512 Pain in left shoulder: Secondary | ICD-10-CM | POA: Diagnosis not present

## 2021-12-18 DIAGNOSIS — G4733 Obstructive sleep apnea (adult) (pediatric): Secondary | ICD-10-CM | POA: Diagnosis not present

## 2021-12-18 DIAGNOSIS — G8929 Other chronic pain: Secondary | ICD-10-CM | POA: Diagnosis not present

## 2021-12-18 DIAGNOSIS — M8589 Other specified disorders of bone density and structure, multiple sites: Secondary | ICD-10-CM | POA: Diagnosis not present

## 2021-12-19 DIAGNOSIS — M4316 Spondylolisthesis, lumbar region: Secondary | ICD-10-CM | POA: Diagnosis not present

## 2021-12-19 DIAGNOSIS — M545 Low back pain, unspecified: Secondary | ICD-10-CM | POA: Diagnosis not present

## 2021-12-19 DIAGNOSIS — M5416 Radiculopathy, lumbar region: Secondary | ICD-10-CM | POA: Diagnosis not present

## 2021-12-19 DIAGNOSIS — M47816 Spondylosis without myelopathy or radiculopathy, lumbar region: Secondary | ICD-10-CM | POA: Diagnosis not present

## 2021-12-26 DIAGNOSIS — M810 Age-related osteoporosis without current pathological fracture: Secondary | ICD-10-CM | POA: Diagnosis not present

## 2022-01-03 DIAGNOSIS — M8448XA Pathological fracture, other site, initial encounter for fracture: Secondary | ICD-10-CM | POA: Diagnosis not present

## 2022-01-03 DIAGNOSIS — M5416 Radiculopathy, lumbar region: Secondary | ICD-10-CM | POA: Diagnosis not present

## 2022-01-03 DIAGNOSIS — G894 Chronic pain syndrome: Secondary | ICD-10-CM | POA: Diagnosis not present

## 2022-01-03 DIAGNOSIS — Z6830 Body mass index (BMI) 30.0-30.9, adult: Secondary | ICD-10-CM | POA: Diagnosis not present

## 2022-01-06 ENCOUNTER — Other Ambulatory Visit: Payer: Self-pay | Admitting: Internal Medicine

## 2022-01-06 DIAGNOSIS — E118 Type 2 diabetes mellitus with unspecified complications: Secondary | ICD-10-CM

## 2022-01-12 ENCOUNTER — Other Ambulatory Visit
Admission: RE | Admit: 2022-01-12 | Discharge: 2022-01-12 | Disposition: A | Discharge status: Home / Self Care | Payer: PPO | Attending: Internal Medicine | Admitting: Internal Medicine

## 2022-01-12 ENCOUNTER — Encounter: Payer: Self-pay | Admitting: Internal Medicine

## 2022-01-12 ENCOUNTER — Ambulatory Visit (INDEPENDENT_AMBULATORY_CARE_PROVIDER_SITE_OTHER): Payer: PPO | Admitting: Internal Medicine

## 2022-01-12 VITALS — BP 124/76 | HR 84 | Temp 97.7°F | Ht 67.0 in | Wt 193.0 lb

## 2022-01-12 DIAGNOSIS — J4521 Mild intermittent asthma with (acute) exacerbation: Secondary | ICD-10-CM | POA: Insufficient documentation

## 2022-01-12 LAB — RESPIRATORY PANEL BY PCR

## 2022-01-12 MED ORDER — LEVOFLOXACIN 500 MG PO TABS: 500.0000 mg | ORAL_TABLET | Freq: Every day | ORAL | 0 refills | Status: AC

## 2022-01-12 MED ORDER — PREDNISONE 20 MG PO TABS: 20.0000 mg | ORAL_TABLET | Freq: Every day | ORAL | 1 refills | Status: DC

## 2022-01-12 NOTE — Patient Instructions (Signed)
CHECK RESPIRATORY VIRAL PANEL  START PRED 20 MG DAILY FOR 10 days  START Levaquin 500 mg daily for 7 days  ALBUTEROL AS NEEDED

## 2022-01-12 NOTE — Progress Notes (Signed)
@Patient  ID: Kirsten [name], female    DOB: 1955/10/23, 66 y.o.   MRN: [id number]  TEST/EVENTS :  PFT 09/20/09 >> FEV1 1.55 (57%), FEV1% 83, TLC 3.00 (54%), DLCO 58% RAST 11/02/15 >> negative IgE 10/28/20 >> 3   Sleep Tests:  PSG 07/22/08 >> AHI 80.4, SpO2 low 82% CPAP 02/22/21 to 02/27/21 >> used on 6 of 6 nights with average 7 hrs 3 min.  Average AHI 0.7 with CPAP 8 cm H2O   Cardiac Tests:  Echo 12/12/20 >> EF greater than 55%, mild LVH, grade 2 DD    CC Follow up ASTHMA Follow up OSA   HPI: 66 year old female never smoker followed for sleep apnea and asthma Medical history significant for diastolic heart failure, atrial fibrillation on amiodarone and Eliquis and diabetes    Follow up : Asthma, Patient was recently admitted in August for stress-induced cardiomyopathy.   She was traveling to [place] and suffered a traumatic fall.   She had multiple left-sided rib fractures and a left clavicular fracture.   She was hospitalized in [place] for several days and hospital stay was complicated by hypercarbic respiratory failure.   CT chest was negative for PE.  Cardiac cath was negative for nonobstructive coronary disease.  Echo showed notable apical akinesis and stress cardiomyopathy with a decreased EF at 35 to 45%.  She was treated for a pneumonia.   CT chest showed a trace left pleural effusion and left lower lobe consolidation with left-sided multiple rib fractures and clavicular fracture. She required diuresis for volume overload.   She was started on o2 2l/m with activity and At bedtime  at discharge.   Since last visit patient says she is feeling better.  Energy level is starting to pick up.  She has completed PT at home. Wants to go to [facility]. Insurance would not cover cardiac rehab.  Walk test in the office today shows no desaturations on room air.  Maintain O2 saturations 93% and above.  Currently has increased work of breathing and wheezing low-grade fevers Patient  currently has asthma exacerbation at this time   FOLLOW UP OSA Patient has obstructive sleep apnea and is on CPAP at bedtime.  Patient says she wears her CPAP every single night cannot sleep without it. Says she benefits from CPAP.  CPAP download shows excellent 100% compliance with daily average usage at 9.5 hours.  Patient is on CPAP at 8 cm H2O.  AHI 0.6  Patient excellent compliance report  CT CHEST 09/2021      Allergies  Allergen Reactions   Doxycycline Rash   Factive [Gemifloxacin Mesylate] Rash   Crestor [Rosuvastatin]     GI upset   Sulfonamide Derivatives     REACTION: rash   Gemifloxacin Rash   Penicillins Hives and Rash    Has patient had a PCN reaction causing immediate rash, facial/tongue/throat swelling, SOB or lightheadedness with hypotension: No Has patient had a PCN reaction causing severe rash involving mucus membranes or skin necrosis: No Has patient had a PCN reaction that required hospitalization No Has patient had a PCN reaction occurring within the last 10 years: No If all of the above answers are "NO", then may proceed with Cephalosporin use.     Immunization History  Administered Date(s) Administered   Influenza Split 11/29/2010, 11/23/2011   Influenza Whole 11/12/2009   Influenza,inj,Quad PF,6+ Mos 10/22/2012, 12/31/2013, 11/02/2014, 11/28/2015, 11/23/2016, 12/25/2017, 11/03/2018, 11/16/2019, 12/20/2020, 11/01/2021   PFIZER(Purple Top)SARS-COV-2 Vaccination 04/25/2019, 05/20/2019, 02/08/2020  PNEUMOCOCCAL CONJUGATE-20 12/20/2020   Pneumococcal Polysaccharide-23 11/23/2011, 11/16/2019   Td 11/01/2021   Tdap 08/02/2010    Past Medical History:  Diagnosis Date   A-fib ([place])    Anxiety disorder    Asthma    Dyspnea    Gastroschisis    umphalocele, rotated organs abd repair until age 81   GERD (gastroesophageal reflux disease)    HTN (hypertension)    Hyperlipidemia    IBS (irritable bowel syndrome)    Morbid obesity (HCC)    Target wt -  185  for BMI < 30   Obesity    OSA on CPAP    SBO (small bowel obstruction) ([place])    Resolved with NG/Bowel rest around 2009   Sleep apnea    Type II or unspecified type diabetes mellitus without mention of complication, not stated as uncontrolled     Tobacco History: Social History   Tobacco Use  Smoking Status Never  Smokeless Tobacco Never  Tobacco Comments   "tried as a teen"   Counseling given: Not Answered Tobacco comments: "tried as a teen"   Outpatient Medications Prior to Visit  Medication Sig Dispense Refill   amiodarone (PACERONE) 200 MG tablet Take 1 tablet (200 mg total) by mouth daily. 90 tablet 1   apixaban (ELIQUIS) 5 MG TABS tablet Take 1 tablet (5 mg total) by mouth 2 (two) times daily. 180 tablet 1   atorvastatin (LIPITOR) 40 MG tablet TAKE ONE TABLET BY MOUTH ONE TIME DAILY 90 tablet 1   Continuous Blood Gluc Sensor (FREESTYLE LIBRE 3 SENSOR) MISC PLACE ONE SENSOR ON THE BACK OF YOUR UPPER ARM EVERY 14 DAYS 1 each 5   cyanocobalamin (,VITAMIN B-12,) 1000 MCG/ML injection INJECT 1ML INTO THE MUSCLE ONCE A MONTH 10 mL 0   furosemide (LASIX) 20 MG tablet      levothyroxine (SYNTHROID) 175 MCG tablet Take 1 tablet (175 mcg total) by mouth daily. 90 tablet 1   losartan (COZAAR) 25 MG tablet Take 1 tablet (25 mg total) by mouth daily. 30 tablet 0   metoprolol succinate (TOPROL-XL) 25 MG 24 hr tablet Take 0.5 tablets (12.5 mg total) by mouth daily. 15 tablet 0   PARoxetine (PAXIL-CR) 25 MG 24 hr tablet TAKE 1 TABLET(25 MG) BY MOUTH DAILY 90 tablet 1   potassium chloride SA (KLOR-CON M15) 15 MEQ tablet Take 1 tablet (15 mEq total) by mouth 2 (two) times daily. 180 tablet 0   No facility-administered medications prior to visit.     BP 124/76 (BP Location: Left Arm, Cuff Size: Normal)   Pulse 84   Temp 97.7 F (36.5 C) (Temporal)   Ht 5' 7"  (1.702 m)   Wt 193 lb (87.5 kg)   SpO2 93%   BMI 30.23 kg/m    Review of Systems: Gen:  Denies  fever, sweats,  chills weight loss  HEENT: Denies blurred vision, double vision, ear pain, eye pain, hearing loss, nose bleeds, sore throat Cardiac:  No dizziness, chest pain or heaviness, chest tightness,edema, No JVD Resp:   No cough, -sputum production, +shortness of breath,+wheezing, -hemoptysis,  Other:  All other systems negative    Physical Examination:   General Appearance: No distress  EYES PERRLA, EOM intact.   NECK Supple, No JVD Pulmonary: normal breath sounds, No wheezing.  CardiovascularNormal S1,S2.  No m/r/g.   Abdomen: Benign, Soft, non-tender. ALL OTHER ROS ARE NEGATIVE         Assessment & Plan:   66 year old pleasant white  female seen today for follow-up asthma exacerbation and assessment of slight underlying sleep apnea, previous diagnosis for stress-induced cardiomyopathy with a EF of 35% along with left lower lobe consolidation and pneumonia status post fall with left sided clavicular fracture and broken ribs   ASTHMA EXACERBATION CHECK RESPIRATORY VIRAL PANEL START PRED 20 MG DAILY FOR 10 days START Levaquin 500 mg daily for 7 days ALBUTEROL AS NEEDED  OSA on CPAP Excellent control and compliance on CPAP.  Continue current settings Report reviewed in detail with patient  Diabetes Mellitis - Sleep apnea can contribute to DM, therefore treatment of sleep apnea is important part of DM management.  Atrial Fibrillation - Sleep apnea can contribute to Atrial Fibrillation, therefore treatment of sleep apnea is important part of A fib management.    Chronic respiratory failure with hypoxia (HCC) No further desaturations with ambulation.  O2 saturations maintained at 93% or above on room air walking today in the office.    Stress-induced cardiomyopathy Appears euvolemic on exam.  Continue follow-up with cardiology    Physical deconditioning Has physical deconditioning from recent prolonged illness.  Recovering from stress-induced cardiomyopathy. Insurance  would not cover cardiac rehab as recommended by cardiology.  Will refer to the [facility] center here at [facility].  Advance activity as tolerated.    CURRENT MEDICATIONS REVIEWED AT LENGTH WITH PATIENT TODAY   Patient  satisfied with Plan of action and management. All questions answered  Follow up 6 months   Total Time Spent  35 mins   Maretta Bees Patricia Pesa, M.D.  [facility]  Medical Director [place] Director [facility] Cardio-Pulmonary Department

## 2022-01-14 DIAGNOSIS — G4733 Obstructive sleep apnea (adult) (pediatric): Secondary | ICD-10-CM | POA: Diagnosis not present

## 2022-01-15 NOTE — Patient Instructions (Signed)

## 2022-01-15 NOTE — Progress Notes (Unsigned)
Subjective:   Kirsten [name] is a 66 y.o. female who presents for an Initial Medicare Annual Wellness Visit. I connected with  Kirsten [name] on 01/15/22 by a audio enabled telemedicine application and verified that I am speaking with the correct person using two identifiers.  Patient Location: Home  Provider Location: Home Office  I discussed the limitations of evaluation and management by telemedicine. The patient expressed understanding and agreed to proceed.   Review of Systems    Deferred to PCP       Objective:    There were no vitals filed for this visit. There is no height or weight on file to calculate BMI.     09/29/2021   10:11 AM 05/15/2021    9:43 AM 02/28/2021    4:10 PM 02/28/2021   11:24 AM 12/04/2020    6:50 AM 09/17/2020    7:42 PM 03/02/2020    4:00 AM  Advanced Directives  Does Patient Have a Medical Advance Directive? No Yes No No No Yes Yes  Type of Furniture conservator/restorer;Living will;Out of facility DNR (pink MOST or yellow form)    Healthcare Power of [place];Living will Healthcare Power of [place];Living will  Does patient want to make changes to medical advance directive?       No - Patient declined  Would patient like information on creating a medical advance directive? No - Patient declined   No - Patient declined No - Patient declined      Current Medications (verified) Outpatient Encounter Medications as of 01/16/2022  Medication Sig   amiodarone (PACERONE) 200 MG tablet Take 1 tablet (200 mg total) by mouth daily.   apixaban (ELIQUIS) 5 MG TABS tablet Take 1 tablet (5 mg total) by mouth 2 (two) times daily.   atorvastatin (LIPITOR) 40 MG tablet TAKE ONE TABLET BY MOUTH ONE TIME DAILY   Continuous Blood Gluc Sensor (FREESTYLE LIBRE 3 SENSOR) MISC PLACE ONE SENSOR ON THE BACK OF YOUR UPPER ARM EVERY 14 DAYS   cyanocobalamin (,VITAMIN B-12,) 1000 MCG/ML injection INJECT INTO THE MUSCLE ONCE A MONTH   furosemide  (LASIX) 20 MG tablet    levofloxacin (LEVAQUIN) 500 MG tablet Take 1 tablet (500 mg total) by mouth daily for 7 days.   levothyroxine (SYNTHROID) 175 MCG tablet Take 1 tablet (175 mcg total) by mouth daily.   losartan (COZAAR) 25 MG tablet Take 1 tablet (25 mg total) by mouth daily.   metoprolol succinate (TOPROL-XL) 25 MG 24 hr tablet Take 0.5 tablets (12.5 mg total) by mouth daily.   PARoxetine (PAXIL-CR) 25 MG 24 hr tablet TAKE 1 TABLET(25 MG) BY MOUTH DAILY   potassium chloride SA (KLOR-CON M15) 15 MEQ tablet Take 1 tablet (15 mEq total) by mouth 2 (two) times daily.   predniSONE (DELTASONE) 20 MG tablet Take 1 tablet (20 mg total) by mouth daily with breakfast. 10 days   No facility-administered encounter medications on file as of 01/16/2022.    Allergies (verified) Doxycycline, Factive [gemifloxacin mesylate], Crestor [rosuvastatin], Sulfonamide derivatives, Gemifloxacin, and Penicillins   History: Past Medical History:  Diagnosis Date   A-fib (HCC)    Anxiety disorder    Asthma    Dyspnea    Gastroschisis    umphalocele, rotated organs abd repair until age 67   GERD (gastroesophageal reflux disease)    HTN (hypertension)    Hyperlipidemia    IBS (irritable bowel syndrome)    Morbid obesity (HCC)  Target wt - 185  for BMI < 30   Obesity    OSA on CPAP    SBO (small bowel obstruction) (HCC)    Resolved with NG/Bowel rest around 2009   Sleep apnea    Type II or unspecified type diabetes mellitus without mention of complication, not stated as uncontrolled    Past Surgical History:  Procedure Laterality Date   BREAST EXCISIONAL BIOPSY Left 03/19/2012   neg   CESAREAN SECTION     CHOLECYSTECTOMY  1992   COLONOSCOPY  2011   2011-normal   Newborn Surgery - GI - ORGANS OUTSIDE ABDOMEN     RIGHT/LEFT HEART CATH AND CORONARY ANGIOGRAPHY N/A 10/02/2021   Procedure: RIGHT/LEFT HEART CATH AND CORONARY ANGIOGRAPHY;  Surgeon: Marcina Millard, MD;  Location: [facility] INVASIVE CV  LAB;  Service: Cardiovascular;  Laterality: N/A;   Small Bowel Repair     TUBAL LIGATION  1988   Family History  Problem Relation Age of Onset   Lymphoma Mother    Diabetes type II Father    Colon cancer Father    Diabetes Maternal Grandmother    Diabetes type I Son    Goiter Neg Hx    Breast cancer Neg Hx    Ovarian cancer Neg Hx    Social History   Socioeconomic History   Marital status: Married    Spouse name: Not on file   Number of children: 3   Years of education: Not on file   Highest education level: Not on file  Occupational History   Occupation: Retired    Comment: Scientist, physiological at [employer]  Tobacco Use   Smoking status: Never   Smokeless tobacco: Never   Tobacco comments:    "tried as a teenManufacturing systems engineer Use: Never used  Substance and Sexual Activity   Alcohol use: Yes    Alcohol/week: 2.0 standard drinks of alcohol    Types: 2 Glasses of [place] per week    Comment: rare   Drug use: No   Sexual activity: Yes    Birth control/protection: Surgical  Other Topics Concern   Not on file  Social History Narrative   Regular Exercise -  YES   Daily Caffeine Use:  2   Active and independent at baseline   Right handed   Lives in a one story home      Social Determinants of Health   Financial Resource Strain: Not on file  Food Insecurity: Not on file  Transportation Needs: Not on file  Physical Activity: Insufficiently Active (01/31/2017)   Exercise Vital Sign    Days of Exercise per Week: 2 days    Minutes of Exercise per Session: 30 min  Stress: Not on file  Social Connections: Not on file    Tobacco Counseling Counseling given: Not Answered Tobacco comments: "tried as a teen"   Clinical Intake:                 Diabetic?Yes Nutrition Risk Assessment:  Has the patient had any N/V/D within the last 2 months?  {YES/NO:21197} Does the patient have any non-healing wounds?  {YES/NO:21197} Has the patient had any  unintentional weight loss or weight gain?  {YES/NO:21197}  Diabetes:  Is the patient diabetic?  {YES/NO:21197} If diabetic, was a CBG obtained today?  {YES/NO:21197} Did the patient bring in their glucometer from home?  {YES/NO:21197} How often do you monitor your CBG's? ***.   Financial Strains and Diabetes Management:  Are  you having any financial strains with the device, your supplies or your medication? {YES/NO:21197}.  Does the patient want to be seen by Chronic Care Management for management of their diabetes?  {YES/NO:21197} Would the patient like to be referred to a Nutritionist or for Diabetic Management?  {YES/NO:21197}  Diabetic Exams:  Diabetic Eye Exam: Completed 08/30/21 Diabetic Foot Exam: Overdue, Pt has been advised about the importance in completing this exam. Pt is scheduled for diabetic foot exam on deferred to PCP.          Activities of Daily Living    09/29/2021    9:00 PM  In your present state of health, do you have any difficulty performing the following activities:  Hearing? 0  Vision? 0  Difficulty concentrating or making decisions? 0  Walking or climbing stairs? 1  Dressing or bathing? 0  Doing errands, shopping? 0    Patient Care Team: Etta Grandchild, MD as PCP - General Allena Katz Noberto Retort, DO as Consulting Physician (Neurology)  Indicate any recent Medical Services you may have received from other than [facility] providers in the past year (date may be approximate).     Assessment:   This is a routine wellness examination for [place].  Hearing/Vision screen No results found.  Dietary issues and exercise activities discussed:     Goals Addressed   None   Depression Screen    11/01/2021    2:29 PM 07/20/2021    8:55 AM 03/22/2021   10:12 AM 07/19/2020    1:37 PM 12/31/2019   10:00 AM 06/02/2019   10:19 AM 11/03/2018   10:41 AM  PHQ 2/9 Scores  PHQ - 2 Score 0 0 0 0 0 3 4  PHQ- 9 Score 0 2 0 0 0 9 11    Fall Risk    05/15/2021    9:43  AM 03/22/2021   10:12 AM 07/19/2020    1:37 PM 09/18/2018    4:38 PM 10/22/2012    4:21 PM  Fall Risk   Falls in the past year? 1 1 1  0 No  Number falls in past yr: 1 0 1 0   Injury with Fall? 1 1 1  0   Risk for fall due to :  History of fall(s)     Follow up  Falls evaluation completed  Falls evaluation completed     FALL RISK PREVENTION PERTAINING TO THE HOME:  Any stairs in or around the home? {YES/NO:21197} If so, are there any without handrails? {YES/NO:21197} Home free of loose throw rugs in walkways, pet beds, electrical cords, etc? {YES/NO:21197} Adequate lighting in your home to reduce risk of falls? {YES/NO:21197}  ASSISTIVE DEVICES UTILIZED TO PREVENT FALLS:  Life alert? {YES/NO:21197} Use of a cane, walker or w/c? {YES/NO:21197} Grab bars in the bathroom? {YES/NO:21197} Shower chair or bench in shower? {YES/NO:21197} Elevated toilet seat or a handicapped toilet? {YES/NO:21197}  Cognitive Function:        Immunizations Immunization History  Administered Date(s) Administered   Influenza Split 11/29/2010, 11/23/2011   Influenza Whole 11/12/2009   Influenza,inj,Quad PF,6+ Mos 10/22/2012, 12/31/2013, 11/02/2014, 11/28/2015, 11/23/2016, 12/25/2017, 11/03/2018, 11/16/2019, 12/20/2020, 11/01/2021   PFIZER(Purple Top)SARS-COV-2 Vaccination 04/25/2019, 05/20/2019, 02/08/2020   PNEUMOCOCCAL CONJUGATE-20 12/20/2020   Pneumococcal Polysaccharide-23 11/23/2011, 11/16/2019   Td 11/01/2021   Tdap 08/02/2010    TDAP status: Up to date  Flu Vaccine status: Up to date  Covid-19 vaccine status: Declined, Education has been provided regarding the importance of this vaccine but patient still declined.  Advised may receive this vaccine at local pharmacy or Health Dept.or vaccine clinic. Aware to provide a copy of the vaccination record if obtained from local pharmacy or Health Dept. Verbalized acceptance and understanding.  Qualifies for Shingles Vaccine? Yes   Zostavax completed No    Shingrix Completed?: No.    Education has been provided regarding the importance of this vaccine. Patient has been advised to call insurance company to determine out of pocket expense if they have not yet received this vaccine. Advised may also receive vaccine at local pharmacy or Health Dept. Verbalized acceptance and understanding.  Screening Tests Health Maintenance  Topic Date Due   Zoster Vaccines- Shingrix (1 of 2) Never done   PAP SMEAR-Modifier  02/01/2020   OPHTHALMOLOGY EXAM  07/20/2021   FOOT EXAM  12/20/2021   Diabetic kidney evaluation - Urine ACR  03/22/2022   HEMOGLOBIN A1C  05/03/2022   Diabetic kidney evaluation - GFR measurement  11/02/2022   Medicare Annual Wellness (AWV)  01/17/2023   MAMMOGRAM  09/01/2023   COLONOSCOPY (Pts 45-24yrs Insurance coverage will need to be confirmed)  11/25/2026   DTaP/Tdap/Td (3 - Td or Tdap) 11/02/2031   Hepatitis C Screening  Completed   HIV Screening  Completed   HPV VACCINES  Aged Out   INFLUENZA VACCINE  Discontinued   COVID-19 Vaccine  Discontinued    Health Maintenance  Health Maintenance Due  Topic Date Due   Zoster Vaccines- Shingrix (1 of 2) Never done   PAP SMEAR-Modifier  02/01/2020   OPHTHALMOLOGY EXAM  07/20/2021   FOOT EXAM  12/20/2021    Colorectal cancer screening: Type of screening: Colonoscopy. Completed 08/25/19. Repeat every 7 years  Mammogram status: Completed 08/31/21. Repeat every year  Lung Cancer Screening: (Low Dose CT Chest recommended if Age 26-80 years, 30 pack-year currently smoking OR have quit w/in 15years.) does not qualify.   Additional Screening:  Hepatitis C Screening: does qualify; Completed 05/24/19  Vision Screening: Recommended annual ophthalmology exams for early detection of glaucoma and other disorders of the eye. Is the patient up to date with their annual eye exam?  Yes  Who is the provider or what is the name of the office in which the patient attends annual eye exams? [facility] If pt is not established with a provider, would they like to be referred to a provider to establish care?  N/A .   Dental Screening: Recommended annual dental exams for proper oral hygiene  Community Resource Referral / Chronic Care Management: CRR required this visit?  {YES/NO:21197}  CCM required this visit?  {YES/NO:21197}     Plan:     I have personally reviewed and noted the following in the patient's chart:   Medical and social history Use of alcohol, tobacco or illicit drugs  Current medications and supplements including opioid prescriptions. Patient is not currently taking opioid prescriptions. Functional ability and status Nutritional status Physical activity Advanced directives List of other physicians Hospitalizations, surgeries, and ER visits in previous 12 months Vitals Screenings to include cognitive, depression, and falls Referrals and appointments  In addition, I have reviewed and discussed with patient certain preventive protocols, quality metrics, and best practice recommendations. A written personalized care plan for preventive services as well as general preventive health recommendations were provided to patient.     Wanda Plump, RN   01/15/2022   Nurse Notes:  Ms. Porter , Thank you for taking time to come for your Medicare Wellness Visit. I appreciate  your ongoing commitment to your health goals. Please review the following plan we discussed and let me know if I can assist you in the future.   These are the goals we discussed:  Goals   None     This is a list of the screening recommended for you and due dates:  Health Maintenance  Topic Date Due   Zoster (Shingles) Vaccine (1 of 2) Never done   Pap Smear  02/01/2020   Eye exam for diabetics  07/20/2021   Complete foot exam   12/20/2021   Yearly kidney health urinalysis for diabetes  03/22/2022   Hemoglobin A1C  05/03/2022   Yearly kidney function blood test for diabetes   11/02/2022   Medicare Annual Wellness Visit  01/17/2023   Mammogram  09/01/2023   Colon Cancer Screening  11/25/2026   DTaP/Tdap/Td vaccine (3 - Td or Tdap) 11/02/2031   Hepatitis C Screening: USPSTF Recommendation to screen - Ages 65-79 yo.  Completed   HIV Screening  Completed   HPV Vaccine  Aged Out   Flu Shot  Discontinued   COVID-19 Vaccine  Discontinued

## 2022-01-16 ENCOUNTER — Ambulatory Visit: Payer: PPO | Admitting: *Deleted

## 2022-01-16 DIAGNOSIS — Z0000 Encounter for general adult medical examination without abnormal findings: Secondary | ICD-10-CM

## 2022-01-17 DIAGNOSIS — G8929 Other chronic pain: Secondary | ICD-10-CM | POA: Diagnosis not present

## 2022-01-17 DIAGNOSIS — M7582 Other shoulder lesions, left shoulder: Secondary | ICD-10-CM | POA: Diagnosis not present

## 2022-01-17 DIAGNOSIS — M25512 Pain in left shoulder: Secondary | ICD-10-CM | POA: Diagnosis not present

## 2022-01-17 DIAGNOSIS — S42035D Nondisplaced fracture of lateral end of left clavicle, subsequent encounter for fracture with routine healing: Secondary | ICD-10-CM | POA: Diagnosis not present

## 2022-01-17 DIAGNOSIS — M19012 Primary osteoarthritis, left shoulder: Secondary | ICD-10-CM | POA: Diagnosis not present

## 2022-01-17 DIAGNOSIS — I5181 Takotsubo syndrome: Secondary | ICD-10-CM | POA: Diagnosis not present

## 2022-01-17 DIAGNOSIS — I480 Paroxysmal atrial fibrillation: Secondary | ICD-10-CM | POA: Diagnosis not present

## 2022-01-17 DIAGNOSIS — E119 Type 2 diabetes mellitus without complications: Secondary | ICD-10-CM | POA: Diagnosis not present

## 2022-01-22 ENCOUNTER — Ambulatory Visit: Payer: PPO | Admitting: Internal Medicine

## 2022-01-22 DIAGNOSIS — M810 Age-related osteoporosis without current pathological fracture: Secondary | ICD-10-CM | POA: Diagnosis not present

## 2022-01-22 DIAGNOSIS — L659 Nonscarring hair loss, unspecified: Secondary | ICD-10-CM | POA: Diagnosis not present

## 2022-01-22 DIAGNOSIS — E063 Autoimmune thyroiditis: Secondary | ICD-10-CM | POA: Diagnosis not present

## 2022-01-24 DIAGNOSIS — R29898 Other symptoms and signs involving the musculoskeletal system: Secondary | ICD-10-CM | POA: Diagnosis not present

## 2022-01-25 ENCOUNTER — Other Ambulatory Visit: Payer: Self-pay | Admitting: Internal Medicine

## 2022-01-25 DIAGNOSIS — E039 Hypothyroidism, unspecified: Secondary | ICD-10-CM

## 2022-01-30 DIAGNOSIS — R208 Other disturbances of skin sensation: Secondary | ICD-10-CM | POA: Diagnosis not present

## 2022-01-30 DIAGNOSIS — L820 Inflamed seborrheic keratosis: Secondary | ICD-10-CM | POA: Diagnosis not present

## 2022-01-30 DIAGNOSIS — Z85828 Personal history of other malignant neoplasm of skin: Secondary | ICD-10-CM | POA: Diagnosis not present

## 2022-01-30 DIAGNOSIS — L538 Other specified erythematous conditions: Secondary | ICD-10-CM | POA: Diagnosis not present

## 2022-01-30 DIAGNOSIS — D2262 Melanocytic nevi of left upper limb, including shoulder: Secondary | ICD-10-CM | POA: Diagnosis not present

## 2022-01-30 DIAGNOSIS — D2261 Melanocytic nevi of right upper limb, including shoulder: Secondary | ICD-10-CM | POA: Diagnosis not present

## 2022-01-30 DIAGNOSIS — D225 Melanocytic nevi of trunk: Secondary | ICD-10-CM | POA: Diagnosis not present

## 2022-01-31 DIAGNOSIS — Z6830 Body mass index (BMI) 30.0-30.9, adult: Secondary | ICD-10-CM | POA: Diagnosis not present

## 2022-01-31 DIAGNOSIS — M8448XA Pathological fracture, other site, initial encounter for fracture: Secondary | ICD-10-CM | POA: Diagnosis not present

## 2022-01-31 DIAGNOSIS — G894 Chronic pain syndrome: Secondary | ICD-10-CM | POA: Diagnosis not present

## 2022-01-31 DIAGNOSIS — M6281 Muscle weakness (generalized): Secondary | ICD-10-CM | POA: Diagnosis not present

## 2022-02-14 DIAGNOSIS — G4733 Obstructive sleep apnea (adult) (pediatric): Secondary | ICD-10-CM | POA: Diagnosis not present

## 2022-02-15 DIAGNOSIS — G4733 Obstructive sleep apnea (adult) (pediatric): Secondary | ICD-10-CM | POA: Diagnosis not present

## 2022-02-26 DIAGNOSIS — G4733 Obstructive sleep apnea (adult) (pediatric): Secondary | ICD-10-CM | POA: Diagnosis not present

## 2022-02-27 ENCOUNTER — Ambulatory Visit (INDEPENDENT_AMBULATORY_CARE_PROVIDER_SITE_OTHER): Payer: PPO | Admitting: Internal Medicine

## 2022-02-27 ENCOUNTER — Encounter: Payer: Self-pay | Admitting: Internal Medicine

## 2022-02-27 VITALS — BP 136/86 | HR 76 | Temp 97.8°F | Resp 16 | Ht 67.0 in | Wt 198.0 lb

## 2022-02-27 DIAGNOSIS — I480 Paroxysmal atrial fibrillation: Secondary | ICD-10-CM

## 2022-02-27 DIAGNOSIS — E039 Hypothyroidism, unspecified: Secondary | ICD-10-CM

## 2022-02-27 DIAGNOSIS — E785 Hyperlipidemia, unspecified: Secondary | ICD-10-CM | POA: Diagnosis not present

## 2022-02-27 DIAGNOSIS — E118 Type 2 diabetes mellitus with unspecified complications: Secondary | ICD-10-CM

## 2022-02-27 DIAGNOSIS — K7581 Nonalcoholic steatohepatitis (NASH): Secondary | ICD-10-CM

## 2022-02-27 DIAGNOSIS — I10 Essential (primary) hypertension: Secondary | ICD-10-CM | POA: Diagnosis not present

## 2022-02-27 DIAGNOSIS — Z0001 Encounter for general adult medical examination with abnormal findings: Secondary | ICD-10-CM

## 2022-02-27 DIAGNOSIS — Z0000 Encounter for general adult medical examination without abnormal findings: Secondary | ICD-10-CM

## 2022-02-27 DIAGNOSIS — F418 Other specified anxiety disorders: Secondary | ICD-10-CM | POA: Diagnosis not present

## 2022-02-27 LAB — CBC WITH DIFFERENTIAL/PLATELET
Basophils Absolute: 0.1 10*3/uL (ref 0.0–0.1)
Basophils Relative: 1.1 % (ref 0.0–3.0)
Eosinophils Absolute: 0.3 10*3/uL (ref 0.0–0.7)
Eosinophils Relative: 2.8 % (ref 0.0–5.0)
HCT: 37.3 % (ref 36.0–46.0)
Hemoglobin: 12.2 g/dL (ref 12.0–15.0)
Lymphocytes Relative: 36.5 % (ref 12.0–46.0)
Lymphs Abs: 3.4 10*3/uL (ref 0.7–4.0)
MCHC: 32.6 g/dL (ref 30.0–36.0)
MCV: 85.7 fl (ref 78.0–100.0)
Monocytes Absolute: 0.7 10*3/uL (ref 0.1–1.0)
Monocytes Relative: 7.3 % (ref 3.0–12.0)
Neutro Abs: 4.9 10*3/uL (ref 1.4–7.7)
Neutrophils Relative %: 52.3 % (ref 43.0–77.0)
Platelets: 284 10*3/uL (ref 150.0–400.0)
RBC: 4.35 Mil/uL (ref 3.87–5.11)
RDW: 15.8 % — ABNORMAL HIGH (ref 11.5–15.5)
WBC: 9.3 10*3/uL (ref 4.0–10.5)

## 2022-02-27 LAB — BASIC METABOLIC PANEL
BUN: 16 mg/dL (ref 6–23)
CO2: 33 mEq/L — ABNORMAL HIGH (ref 19–32)
Calcium: 10 mg/dL (ref 8.4–10.5)
Chloride: 103 mEq/L (ref 96–112)
Creatinine, Ser: 0.87 mg/dL (ref 0.40–1.20)
GFR: 70.33 mL/min (ref >60.00)
Glucose, Bld: 132 mg/dL — ABNORMAL HIGH (ref 70–99)
Potassium: 4.5 mEq/L (ref 3.5–5.1)
Sodium: 142 mEq/L (ref 135–145)

## 2022-02-27 LAB — HEPATIC FUNCTION PANEL
ALT: 52 U/L — ABNORMAL HIGH (ref 0–35)
AST: 61 U/L — ABNORMAL HIGH (ref 0–37)
Albumin: 4.2 g/dL (ref 3.5–5.2)
Alkaline Phosphatase: 95 U/L (ref 39–117)
Bilirubin, Direct: 0.1 mg/dL (ref 0.0–0.3)
Total Bilirubin: 0.4 mg/dL (ref 0.2–1.2)
Total Protein: 6.9 g/dL (ref 6.0–8.3)

## 2022-02-27 LAB — TSH: TSH: 1.4 u[IU]/mL (ref 0.35–5.50)

## 2022-02-27 LAB — MICROALBUMIN / CREATININE URINE RATIO
Creatinine,U: 51.1 mg/dL
Microalb Creat Ratio: 1.4 mg/g (ref 0.0–30.0)
Microalb, Ur: <0.7 mg/dL (ref 0.0–1.9)

## 2022-02-27 LAB — HEMOGLOBIN A1C: Hgb A1c MFr Bld: 6.9 % — ABNORMAL HIGH (ref 4.6–6.5)

## 2022-02-27 MED ORDER — PAROXETINE HCL ER 25 MG PO TB24: ORAL_TABLET | ORAL | 1 refills | Status: DC

## 2022-02-27 MED ORDER — ATORVASTATIN CALCIUM 40 MG PO TABS: ORAL_TABLET | ORAL | 1 refills | Status: DC

## 2022-02-27 MED ORDER — APIXABAN 5 MG PO TABS: 5.0000 mg | ORAL_TABLET | Freq: Two times a day (BID) | ORAL | 1 refills | Status: DC

## 2022-02-27 NOTE — Patient Instructions (Signed)

## 2022-02-27 NOTE — Progress Notes (Signed)
Subjective:  Patient ID: Kirsten [name], female    DOB: 12-26-1955  Age: 67 y.o. MRN: [id number]  CC: Annual Exam, Diabetes, Hypothyroidism, and Atrial Fibrillation   HPI Kirsten [name] presents for a CPX and f/up -  She is active and denies chest pain, shortness of breath, diaphoresis, palpitations, or edema.  Outpatient Medications Prior to Visit  Medication Sig Dispense Refill   amiodarone (PACERONE) 200 MG tablet Take 1 tablet (200 mg total) by mouth daily. 90 tablet 1   Continuous Blood Gluc Sensor (FREESTYLE LIBRE 3 SENSOR) MISC PLACE ONE SENSOR ON THE BACK OF YOUR UPPER ARM EVERY 14 DAYS 1 each 5   cyanocobalamin (,VITAMIN B-12,) 1000 MCG/ML injection INJECT 1ML INTO THE MUSCLE ONCE A MONTH 10 mL 0   furosemide (LASIX) 20 MG tablet      levothyroxine (SYNTHROID) 175 MCG tablet TAKE ONE TABLET BY MOUTH ONE TIME DAILY 90 tablet 1   potassium chloride SA (KLOR-CON M15) 15 MEQ tablet Take 1 tablet (15 mEq total) by mouth 2 (two) times daily. 180 tablet 0   predniSONE (DELTASONE) 20 MG tablet Take 1 tablet (20 mg total) by mouth daily with breakfast. 10 days 10 tablet 1   apixaban (ELIQUIS) 5 MG TABS tablet Take 1 tablet (5 mg total) by mouth 2 (two) times daily. 180 tablet 1   atorvastatin (LIPITOR) 40 MG tablet TAKE ONE TABLET BY MOUTH ONE TIME DAILY 90 tablet 1   PARoxetine (PAXIL-CR) 25 MG 24 hr tablet TAKE 1 TABLET(25 MG) BY MOUTH DAILY 90 tablet 1   losartan (COZAAR) 25 MG tablet Take 1 tablet (25 mg total) by mouth daily. 30 tablet 0   metoprolol succinate (TOPROL-XL) 25 MG 24 hr tablet Take 0.5 tablets (12.5 mg total) by mouth daily. 15 tablet 0   No facility-administered medications prior to visit.    ROS Review of Systems  Constitutional:  Positive for unexpected weight change (wt gain). Negative for chills, diaphoresis and fatigue.  HENT: Negative.    Eyes: Negative.   Respiratory:  Negative for cough, chest tightness, shortness of breath and wheezing.    Cardiovascular:  Negative for chest pain, palpitations and leg swelling.  Gastrointestinal:  Negative for abdominal pain, diarrhea, nausea and vomiting.  Endocrine: Negative.   Genitourinary: Negative.  Negative for difficulty urinating.  Musculoskeletal: Negative.  Negative for arthralgias and myalgias.  Skin: Negative.   Neurological:  Negative for dizziness and weakness.  Hematological:  Negative for adenopathy. Does not bruise/bleed easily.  Psychiatric/Behavioral: Negative.      Objective:  BP 136/86 (BP Location: Left Arm, Patient Position: Sitting, Cuff Size: Large)   Pulse 76   Temp 97.8 F (36.6 C) (Oral)   Resp 16   Ht '5\' 7"'$  (1.702 m)   Wt 198 lb (89.8 kg)   SpO2 93%   BMI 31.01 kg/m   BP Readings from Last 3 Encounters:  02/27/22 136/86  01/12/22 124/76  12/05/21 124/78    Wt Readings from Last 3 Encounters:  02/27/22 198 lb (89.8 kg)  01/12/22 193 lb (87.5 kg)  12/05/21 189 lb (85.7 kg)    Physical Exam Vitals reviewed.  Constitutional:      Appearance: She is not ill-appearing.  HENT:     Mouth/Throat:     Mouth: Mucous membranes are moist.  Eyes:     General: No scleral icterus.    Conjunctiva/sclera: Conjunctivae normal.  Cardiovascular:     Rate and Rhythm: Normal rate and regular rhythm.  Heart sounds: No murmur heard. Pulmonary:     Effort: Pulmonary effort is normal.     Breath sounds: No stridor. No wheezing, rhonchi or rales.  Abdominal:     General: Abdomen is protuberant. Bowel sounds are normal. There is no distension.     Palpations: Abdomen is soft. There is no fluid wave, hepatomegaly, splenomegaly or mass.     Tenderness: There is no abdominal tenderness.  Musculoskeletal:        General: Normal range of motion.     Cervical back: Neck supple.     Right lower leg: No edema.     Left lower leg: No edema.  Lymphadenopathy:     Cervical: No cervical adenopathy.  Skin:    General: Skin is warm and dry.  Neurological:      General: No focal deficit present.     Mental Status: She is alert.  Psychiatric:        Mood and Affect: Mood normal.        Behavior: Behavior normal.     Lab Results  Component Value Date   WBC 9.3 02/27/2022   HGB 12.2 02/27/2022   HCT 37.3 02/27/2022   PLT 284.0 02/27/2022   GLUCOSE 132 (H) 02/27/2022   CHOL 83 09/30/2021   TRIG 70 09/30/2021   HDL 37 (L) 09/30/2021   LDLDIRECT 63.0 01/15/2019   LDLCALC 32 09/30/2021   ALT 52 (H) 02/27/2022   AST 61 (H) 02/27/2022   NA 142 02/27/2022   K 4.5 02/27/2022   CL 103 02/27/2022   CREATININE 0.87 02/27/2022   BUN 16 02/27/2022   CO2 33 (H) 02/27/2022   TSH 1.40 02/27/2022   INR 1.4 (H) 09/29/2021   HGBA1C 6.9 (H) 02/27/2022   MICROALBUR <0.7 02/27/2022    DG Chest 2 View  Result Date: 12/07/2021 CLINICAL DATA:  Follow-up pneumonia. EXAM: CHEST - 2 VIEW COMPARISON:  September 30, 2021, September 29, 2021, December 04, 2020 FINDINGS: The heart size and mediastinal contours are within normal limits. Minimal linear atelectasis of left lung base identified. No focal infiltrate or pulmonary edema identified. Chronic volume loss in the right chest is stable compared to prior exam. The visualized skeletal structures are stable. IMPRESSION: Minimal linear atelectasis of left lung base. No focal pneumonia. Electronically Signed   By: Abelardo Diesel M.D.   On: 12/07/2021 13:57     Assessment & Plan:   Kirsten [name] was seen today for annual exam, diabetes, hypothyroidism and atrial fibrillation.  Diagnoses and all orders for this visit:  Essential hypertension, benign- Her blood pressure is adequately well-controlled. -     Basic metabolic panel; Future -     CBC with Differential/Platelet; Future -     Urinalysis, Routine w reflex microscopic; Future -     Urinalysis, Routine w reflex microscopic -     CBC with Differential/Platelet -     Basic metabolic panel  NASH (nonalcoholic steatohepatitis)- She is working on her lifestyle  modifications. -     Hepatic function panel; Future -     Hepatic function panel  Type II diabetes mellitus with manifestations ([place])- Her blood sugar is adequately well-controlled. -     Basic metabolic panel; Future -     Microalbumin / creatinine urine ratio; Future -     Hemoglobin A1c; Future -     Urinalysis, Routine w reflex microscopic; Future -     HM Diabetes Foot Exam -     Urinalysis, Routine  w reflex microscopic -     Hemoglobin A1c -     Microalbumin / creatinine urine ratio -     Basic metabolic panel  Paroxysmal atrial fibrillation ([place])- She has good rate and rhythm control. -     apixaban (ELIQUIS) 5 MG TABS tablet; Take 1 tablet (5 mg total) by mouth 2 (two) times daily. -     TSH; Future -     TSH  Depression with anxiety -     PARoxetine (PAXIL-CR) 25 MG 24 hr tablet; TAKE 1 TABLET(25 MG) BY MOUTH DAILY  Hyperlipidemia with target LDL less than 100- LDL goal achieved. Doing well on the statin  -     atorvastatin (LIPITOR) 40 MG tablet; TAKE ONE TABLET BY MOUTH ONE TIME DAILY  Hypothyroidism, unspecified type- She is euthyroid. -     TSH; Future -     TSH  Encounter for general adult medical examination with abnormal findings- Exam completed, labs reviewed, vaccines reviewed and updated, cancer screenings are up-to-date, patient education was given.   I am having Armonie A. Baisch maintain her amiodarone, cyanocobalamin, FreeStyle Libre 3 Sensor, losartan, metoprolol succinate, potassium chloride SA, furosemide, predniSONE, levothyroxine, apixaban, PARoxetine, and atorvastatin.  Meds ordered this encounter  Medications   apixaban (ELIQUIS) 5 MG TABS tablet    Sig: Take 1 tablet (5 mg total) by mouth 2 (two) times daily.    Dispense:  180 tablet    Refill:  1   PARoxetine (PAXIL-CR) 25 MG 24 hr tablet    Sig: TAKE 1 TABLET(25 MG) BY MOUTH DAILY    Dispense:  90 tablet    Refill:  1   atorvastatin (LIPITOR) 40 MG tablet    Sig: TAKE ONE TABLET BY MOUTH  ONE TIME DAILY    Dispense:  90 tablet    Refill:  1     Follow-up: Return in about 6 months (around 08/28/2022).  [name], MD

## 2022-02-28 LAB — URINALYSIS, ROUTINE W REFLEX MICROSCOPIC
Bilirubin Urine: NEGATIVE
Hgb urine dipstick: NEGATIVE
Ketones, ur: NEGATIVE
Nitrite: NEGATIVE
RBC / HPF: NONE SEEN (ref 0–?)
Specific Gravity, Urine: 1.02 (ref 1.000–1.030)
Total Protein, Urine: NEGATIVE
Urine Glucose: NEGATIVE
Urobilinogen, UA: 0.2 (ref 0.0–1.0)
pH: 5.5 (ref 5.0–8.0)

## 2022-03-02 ENCOUNTER — Encounter: Payer: Self-pay | Admitting: Internal Medicine

## 2022-03-17 DIAGNOSIS — G4733 Obstructive sleep apnea (adult) (pediatric): Secondary | ICD-10-CM | POA: Diagnosis not present

## 2022-03-23 DIAGNOSIS — G4733 Obstructive sleep apnea (adult) (pediatric): Secondary | ICD-10-CM | POA: Diagnosis not present

## 2022-04-06 ENCOUNTER — Other Ambulatory Visit: Payer: Self-pay | Admitting: Internal Medicine

## 2022-04-06 DIAGNOSIS — E118 Type 2 diabetes mellitus with unspecified complications: Secondary | ICD-10-CM

## 2022-04-15 DIAGNOSIS — G4733 Obstructive sleep apnea (adult) (pediatric): Secondary | ICD-10-CM | POA: Diagnosis not present

## 2022-04-16 ENCOUNTER — Encounter: Payer: Self-pay | Admitting: Emergency Medicine

## 2022-04-16 ENCOUNTER — Emergency Department: Payer: PPO

## 2022-04-16 ENCOUNTER — Inpatient Hospital Stay
Admission: EM | Admit: 2022-04-16 | Discharge: 2022-04-19 | DRG: 202 | Disposition: A | Discharge status: Home / Self Care | Payer: PPO | Attending: Internal Medicine | Admitting: Internal Medicine

## 2022-04-16 DIAGNOSIS — R051 Acute cough: Secondary | ICD-10-CM

## 2022-04-16 DIAGNOSIS — Z7901 Long term (current) use of anticoagulants: Secondary | ICD-10-CM | POA: Diagnosis not present

## 2022-04-16 DIAGNOSIS — F418 Other specified anxiety disorders: Secondary | ICD-10-CM | POA: Diagnosis present

## 2022-04-16 DIAGNOSIS — J209 Acute bronchitis, unspecified: Principal | ICD-10-CM | POA: Diagnosis present

## 2022-04-16 DIAGNOSIS — J9601 Acute respiratory failure with hypoxia: Secondary | ICD-10-CM | POA: Diagnosis not present

## 2022-04-16 DIAGNOSIS — J8410 Pulmonary fibrosis, unspecified: Secondary | ICD-10-CM | POA: Diagnosis present

## 2022-04-16 DIAGNOSIS — E119 Type 2 diabetes mellitus without complications: Secondary | ICD-10-CM | POA: Diagnosis not present

## 2022-04-16 DIAGNOSIS — E039 Hypothyroidism, unspecified: Secondary | ICD-10-CM | POA: Diagnosis present

## 2022-04-16 DIAGNOSIS — F32A Depression, unspecified: Secondary | ICD-10-CM | POA: Diagnosis present

## 2022-04-16 DIAGNOSIS — Z79899 Other long term (current) drug therapy: Secondary | ICD-10-CM | POA: Diagnosis not present

## 2022-04-16 DIAGNOSIS — A419 Sepsis, unspecified organism: Principal | ICD-10-CM

## 2022-04-16 DIAGNOSIS — I10 Essential (primary) hypertension: Secondary | ICD-10-CM | POA: Diagnosis not present

## 2022-04-16 DIAGNOSIS — G4733 Obstructive sleep apnea (adult) (pediatric): Secondary | ICD-10-CM

## 2022-04-16 DIAGNOSIS — I480 Paroxysmal atrial fibrillation: Secondary | ICD-10-CM | POA: Diagnosis not present

## 2022-04-16 DIAGNOSIS — Z880 Allergy status to penicillin: Secondary | ICD-10-CM

## 2022-04-16 DIAGNOSIS — K219 Gastro-esophageal reflux disease without esophagitis: Secondary | ICD-10-CM | POA: Diagnosis present

## 2022-04-16 DIAGNOSIS — Z888 Allergy status to other drugs, medicaments and biological substances status: Secondary | ICD-10-CM | POA: Diagnosis not present

## 2022-04-16 DIAGNOSIS — Z9851 Tubal ligation status: Secondary | ICD-10-CM

## 2022-04-16 DIAGNOSIS — R6520 Severe sepsis without septic shock: Secondary | ICD-10-CM | POA: Diagnosis not present

## 2022-04-16 DIAGNOSIS — Z881 Allergy status to other antibiotic agents status: Secondary | ICD-10-CM | POA: Diagnosis not present

## 2022-04-16 DIAGNOSIS — Z833 Family history of diabetes mellitus: Secondary | ICD-10-CM | POA: Diagnosis not present

## 2022-04-16 DIAGNOSIS — Z882 Allergy status to sulfonamides status: Secondary | ICD-10-CM | POA: Diagnosis not present

## 2022-04-16 DIAGNOSIS — N390 Urinary tract infection, site not specified: Secondary | ICD-10-CM | POA: Diagnosis present

## 2022-04-16 DIAGNOSIS — Z79890 Hormone replacement therapy (postmenopausal): Secondary | ICD-10-CM

## 2022-04-16 DIAGNOSIS — J9811 Atelectasis: Secondary | ICD-10-CM | POA: Diagnosis not present

## 2022-04-16 DIAGNOSIS — R0602 Shortness of breath: Secondary | ICD-10-CM | POA: Diagnosis not present

## 2022-04-16 DIAGNOSIS — R079 Chest pain, unspecified: Secondary | ICD-10-CM | POA: Diagnosis not present

## 2022-04-16 DIAGNOSIS — E785 Hyperlipidemia, unspecified: Secondary | ICD-10-CM | POA: Diagnosis present

## 2022-04-16 DIAGNOSIS — K589 Irritable bowel syndrome without diarrhea: Secondary | ICD-10-CM | POA: Diagnosis not present

## 2022-04-16 DIAGNOSIS — F419 Anxiety disorder, unspecified: Secondary | ICD-10-CM | POA: Diagnosis not present

## 2022-04-16 DIAGNOSIS — Z9049 Acquired absence of other specified parts of digestive tract: Secondary | ICD-10-CM

## 2022-04-16 DIAGNOSIS — Z1152 Encounter for screening for COVID-19: Secondary | ICD-10-CM | POA: Diagnosis not present

## 2022-04-16 DIAGNOSIS — R9431 Abnormal electrocardiogram [ECG] [EKG]: Secondary | ICD-10-CM | POA: Diagnosis not present

## 2022-04-16 MED ORDER — IPRATROPIUM-ALBUTEROL 0.5-2.5 (3) MG/3ML IN SOLN
3.0000 mL | RESPIRATORY_TRACT | Status: AC
  Administered 2022-04-16 – 2022-04-17 (×3): 3 mL via RESPIRATORY_TRACT
  Filled 2022-04-16 (×3): qty 3

## 2022-04-16 MED ORDER — VANCOMYCIN HCL 2000 MG/400ML IV SOLN
2000.0000 mg | Freq: Once | INTRAVENOUS | Status: AC
  Administered 2022-04-17: 2000 mg via INTRAVENOUS
  Filled 2022-04-16: qty 400

## 2022-04-16 MED ORDER — VANCOMYCIN HCL IN DEXTROSE 1-5 GM/200ML-% IV SOLN: 1000.0000 mg | Freq: Once | INTRAVENOUS | Status: DC

## 2022-04-16 MED ORDER — METHYLPREDNISOLONE SODIUM SUCC 125 MG IJ SOLR
125.0000 mg | Freq: Once | INTRAMUSCULAR | Status: AC
  Administered 2022-04-17: 125 mg via INTRAVENOUS
  Filled 2022-04-16: qty 2

## 2022-04-16 MED ORDER — METRONIDAZOLE 500 MG/100ML IV SOLN
500.0000 mg | Freq: Once | INTRAVENOUS | Status: AC
  Administered 2022-04-17: 500 mg via INTRAVENOUS
  Filled 2022-04-16: qty 100

## 2022-04-16 MED ORDER — LACTATED RINGERS IV BOLUS (SEPSIS)
1000.0000 mL | Freq: Once | INTRAVENOUS | Status: AC
  Administered 2022-04-16: 1000 mL via INTRAVENOUS

## 2022-04-16 MED ORDER — LACTATED RINGERS IV SOLN: INTRAVENOUS | Status: DC

## 2022-04-16 MED ORDER — SODIUM CHLORIDE 0.9 % IV SOLN
2.0000 g | Freq: Once | INTRAVENOUS | Status: AC
  Administered 2022-04-17: 2 g via INTRAVENOUS
  Filled 2022-04-16: qty 10

## 2022-04-16 NOTE — ED Provider Notes (Signed)
[facility] Provider Note    Event Date/Time   First MD Initiated Contact with Patient 04/16/22 2321     (approximate)   History   Shortness of Breath   HPI  Kirsten [name] is a 67 y.o. female with history of hypertension, hyperlipidemia, diabetes, asthma, A-fib on Eliquis, previous history of Takotsubo's cardiomyopathy who presents to the emergency department with complaints of low-grade fever of 100, shortness of breath and deep cough.  Has been admitted to the hospital for for pneumonia.  Does not wear oxygen chronically.  Sats here 79% on room air on arrival.  Took Tylenol at 10:30 PM.   History provided by patient, husband.    Past Medical History:  Diagnosis Date   A-fib ([place])    Anxiety disorder    Asthma    Dyspnea    Gastroschisis    umphalocele, rotated organs abd repair until age 53   GERD (gastroesophageal reflux disease)    HTN (hypertension)    Hyperlipidemia    IBS (irritable bowel syndrome)    Morbid obesity (HCC)    Target wt - 185  for BMI < 30   Obesity    OSA on CPAP    SBO (small bowel obstruction) ([place])    Resolved with NG/Bowel rest around 2009   Sleep apnea    Type II or unspecified type diabetes mellitus without mention of complication, not stated as uncontrolled     Past Surgical History:  Procedure Laterality Date   BREAST EXCISIONAL BIOPSY Left 03/19/2012   neg   CESAREAN SECTION     CHOLECYSTECTOMY  1992   COLONOSCOPY  2011   2011-normal   Newborn Surgery - GI - ORGANS OUTSIDE ABDOMEN     RIGHT/LEFT HEART CATH AND CORONARY ANGIOGRAPHY N/A 10/02/2021   Procedure: RIGHT/LEFT HEART CATH AND CORONARY ANGIOGRAPHY;  Surgeon: Isaias Cowman, MD;  Location: [place] [facility];  Service: Cardiovascular;  Laterality: N/A;   Small Bowel Repair     TUBAL LIGATION  1988    MEDICATIONS:  Prior to Admission medications   Medication Sig Start Date End Date Taking? Authorizing Provider  amiodarone (PACERONE)  200 MG tablet Take 1 tablet (200 mg total) by mouth daily. 03/07/20   Max Sane, MD  apixaban (ELIQUIS) 5 MG TABS tablet Take 1 tablet (5 mg total) by mouth 2 (two) times daily. 02/27/22   [name], MD  atorvastatin (LIPITOR) 40 MG tablet TAKE ONE TABLET BY MOUTH ONE TIME DAILY 02/27/22   [name], MD  Continuous Blood Gluc Sensor (FREESTYLE LIBRE 3 SENSOR) MISC PLACE ONE SENSOR ON THE BACK OF YOUR UPPER ARM EVERY 14 DAYS 09/29/21   [name], MD  cyanocobalamin (,VITAMIN B-12,) 1000 MCG/ML injection INJECT 1ML INTO THE MUSCLE ONCE A MONTH 08/21/21   Narda Amber K, DO  furosemide (LASIX) 20 MG tablet     [provider]  levothyroxine (SYNTHROID) 175 MCG tablet TAKE ONE TABLET BY MOUTH ONE TIME DAILY 01/25/22   [name], MD  losartan (COZAAR) 25 MG tablet Take 1 tablet (25 mg total) by mouth daily. 10/07/21 11/06/21  Wyvonnia Dusky, MD  metoprolol succinate (TOPROL-XL) 25 MG 24 hr tablet Take 0.5 tablets (12.5 mg total) by mouth daily. 10/07/21 11/06/21  Wyvonnia Dusky, MD  PARoxetine (PAXIL-CR) 25 MG 24 hr tablet TAKE 1 TABLET(25 MG) BY MOUTH DAILY 02/27/22   [name], MD  potassium chloride SA (KLOR-CON M15) 15  MEQ tablet Take 1 tablet (15 mEq total) by mouth 2 (two) times daily. 11/02/21   [name], MD  predniSONE (DELTASONE) 20 MG tablet Take 1 tablet (20 mg total) by mouth daily with breakfast. 10 days 01/12/22   Flora Lipps, MD    Physical Exam   Triage Vital Signs: ED Triage Vitals  Enc Vitals Group     BP 04/16/22 2314 (!) 151/86     Pulse Rate 04/16/22 2314 96     Resp 04/16/22 2314 (!) 30     Temp 04/16/22 2314 99.6 F (37.6 C)     Temp Source 04/16/22 2314 Oral     SpO2 04/16/22 2314 (!) 79 %     Weight 04/16/22 2316 190 lb (86.2 kg)     Height 04/16/22 2316 '5\' 7"'$  (1.702 m)     Head Circumference --      Peak Flow --      Pain Score --      Pain Loc --      Pain Edu? --      Excl. in [place]? --     Most recent vital  signs: Vitals:   04/17/22 0120 04/17/22 0121  BP: (!) 113/58   Pulse: 73 75  Resp: 17 17  Temp:    SpO2: 95% 96%    CONSTITUTIONAL: Alert, responds appropriately to questions.  Appears uncomfortable, diaphoretic HEAD: Normocephalic, atraumatic EYES: Conjunctivae clear, pupils appear equal, sclera nonicteric ENT: normal nose; moist mucous membranes NECK: Supple, normal ROM CARD: RRR; S1 and S2 appreciated RESP: Patient is tachypneic, hypoxic on room air.  Diminished aeration.  No rhonchi, wheezing or rales. ABD/GI: Non-distended; soft, non-tender, no rebound, no guarding, no peritoneal signs BACK: The back appears normal EXT: Normal ROM in all joints; no deformity noted, no edema, no calf tenderness or calf swelling SKIN: Normal color for age and race; warm; no rash on exposed skin NEURO: Moves all extremities equally, normal speech PSYCH: The patient's mood and manner are appropriate.   ED Results / Procedures / Treatments   LABS: (all labs ordered are listed, but only abnormal results are displayed) Labs Reviewed  COMPREHENSIVE METABOLIC PANEL - Abnormal; Notable for the following components:      Result Value   Glucose, Bld 191 (*)    AST 47 (*)    All other components within normal limits  CBC WITH DIFFERENTIAL/PLATELET - Abnormal; Notable for the following components:   WBC 11.6 (*)    Hemoglobin 11.5 (*)    MCHC 29.8 (*)    Neutro Abs 8.5 (*)    All other components within normal limits  PROTIME-INR - Abnormal; Notable for the following components:   Prothrombin Time 15.6 (*)    INR 1.3 (*)    All other components within normal limits  APTT - Abnormal; Notable for the following components:   aPTT 37 (*)    All other components within normal limits  URINALYSIS, COMPLETE (UACMP) WITH MICROSCOPIC - Abnormal; Notable for the following components:   Color, Urine YELLOW (*)    APPearance HAZY (*)    Protein, ur 30 (*)    Leukocytes,Ua LARGE (*)    Bacteria, UA RARE  (*)    All other components within normal limits  BRAIN NATRIURETIC PEPTIDE - Abnormal; Notable for the following components:   B Natriuretic Peptide 179.9 (*)    All other components within normal limits  BLOOD GAS, VENOUS - Abnormal; Notable for the following components:  pO2, Ven 48 (*)    Bicarbonate 30.9 (*)    Acid-Base Excess 3.2 (*)    All other components within normal limits  TROPONIN I (HIGH SENSITIVITY) - Abnormal; Notable for the following components:   Troponin I (High Sensitivity) 78 (*)    All other components within normal limits  RESP PANEL BY RT-PCR (RSV, FLU A&B, COVID)  RVPGX2  CULTURE, BLOOD (ROUTINE X 2)  CULTURE, BLOOD (ROUTINE X 2)  URINE CULTURE  LACTIC ACID, PLASMA  TROPONIN I (HIGH SENSITIVITY)     EKG:  EKG Interpretation  Date/Time:  Monday April 16 2022 23:13:35 EST Ventricular Rate:  96 PR Interval:  228 QRS Duration: 142 QT Interval:  386 QTC Calculation: 487 R Axis:   -36 Text Interpretation: Sinus rhythm with 1st degree A-V block Left axis deviation Non-specific intra-ventricular conduction block Minimal voltage criteria for LVH, may be normal variant ( Cornell product ) Abnormal ECG When compared with ECG of 30-Sep-2021 11:17, PR interval has increased T wave inversion no longer evident in Anterolateral leads Confirmed by Pryor Curia [phone]) on 04/16/2022 11:26:08 PM         RADIOLOGY: My personal review and interpretation of imaging: Chest x-ray clear.  CTA of the chest shows no PE or opacity.  I have personally reviewed all radiology reports.   CT Angio Chest PE W and/or Wo Contrast  Result Date: 04/17/2022 CLINICAL DATA:  Pulmonary embolism (PE) suspected, high prob. Shortness of breath EXAM: CT ANGIOGRAPHY CHEST WITH CONTRAST TECHNIQUE: Multidetector CT imaging of the chest was performed using the standard protocol during bolus administration of intravenous contrast. Multiplanar CT image reconstructions and MIPs were obtained to  evaluate the vascular anatomy. RADIATION DOSE REDUCTION: This exam was performed according to the departmental dose-optimization program which includes automated exposure control, adjustment of the mA and/or kV according to patient size and/or use of iterative reconstruction technique. CONTRAST:  75m OMNIPAQUE IOHEXOL 350 MG/ML SOLN COMPARISON:  09/29/2021 FINDINGS: Cardiovascular: No filling defects in the pulmonary arteries to suggest pulmonary emboli. Cardiomegaly. Scattered coronary artery and aortic calcifications. Mediastinum/Nodes: No mediastinal, hilar, or axillary adenopathy. Trachea and esophagus are unremarkable. Thyroid unremarkable. Lungs/Pleura: Bibasilar atelectasis.  No effusions. Upper Abdomen: No acute findings. Posterior right diaphragmatic hernia containing fat. Musculoskeletal: Chest wall soft tissues are unremarkable. No acute bony abnormality. Review of the MIP images confirms the above findings. IMPRESSION: No evidence of pulmonary embolus. Cardiomegaly. Bibasilar atelectasis. Aortic Atherosclerosis (ICD10-I70.0). Electronically Signed   By: KRolm BaptiseM.D.   On: 04/17/2022 02:00   DG Chest Port 1 View  Result Date: 04/16/2022 CLINICAL DATA:  Chest pain EXAM: PORTABLE CHEST 1 VIEW COMPARISON:  12/05/2021 FINDINGS: Cardiac shadow is enlarged in size. Lungs are well aerated bilaterally. Mild scarring is noted in the bases bilaterally. No focal infiltrate or effusion is seen. No bony abnormality is noted. IMPRESSION: No active disease. Electronically Signed   By: MInez CatalinaM.D.   On: 04/16/2022 23:42     PROCEDURES:  Critical Care performed: Yes, see critical care procedure note(s)   CRITICAL CARE Performed by: KCyril MourningWard   Total critical care time: 45 minutes  Critical care time was exclusive of separately billable procedures and treating other patients.  Critical care was necessary to treat or prevent imminent or life-threatening deterioration.  Critical care was  time spent personally by me on the following activities: development of treatment plan with patient and/or surrogate as well as nursing, discussions with consultants, evaluation of patient's response to  treatment, examination of patient, obtaining history from patient or surrogate, ordering and performing treatments and interventions, ordering and review of laboratory studies, ordering and review of radiographic studies, pulse oximetry and re-evaluation of patient's condition.   Marland Kitchen1-3 Lead EKG Interpretation  Performed by: Robertha Staples, Delice Bison, DO Authorized by: Cordai Rodrigue, Delice Bison, DO     Interpretation: normal     ECG rate:  96   ECG rate assessment: normal     Rhythm: sinus rhythm     Ectopy: none     Conduction: normal       IMPRESSION / MDM / ASSESSMENT AND PLAN / ED COURSE  I reviewed the triage vital signs and the nursing notes.    Patient here with fever at home, tachypnea, shortness of breath, hypoxia, cough.  The patient is on the cardiac monitor to evaluate for evidence of arrhythmia and/or significant heart rate changes.   DIFFERENTIAL DIAGNOSIS (includes but not limited to):   Sepsis, pneumonia, viral URI, CHF, pneumothorax, PE   Patient's presentation is most consistent with acute presentation with potential threat to life or bodily function.   PLAN: Will initiate septic workup.  Will give broad antibiotics.  No hypotension currently and has history of substance abuse cardiomyopathy therefore we will just start with 1 L of fluid until first lactic is back or if blood pressures drop.  Patient will need admission.   MEDICATIONS GIVEN IN ED: Medications  lactated ringers infusion ( Intravenous New Bag/Given 04/17/22 0000)  vancomycin (VANCOREADY) IVPB 2000 mg/400 mL (2,000 mg Intravenous New Bag/Given 04/17/22 0028)  lactated ringers bolus 1,000 mL (0 mLs Intravenous Stopped 04/17/22 0100)  aztreonam (AZACTAM) 2 g in sodium chloride 0.9 % 100 mL IVPB (0 g Intravenous Stopped  04/17/22 0115)  metroNIDAZOLE (FLAGYL) IVPB 500 mg (0 mg Intravenous Stopped 04/17/22 0144)  ipratropium-albuterol (DUONEB) 0.5-2.5 (3) MG/3ML nebulizer solution 3 mL (3 mLs Nebulization Given 04/17/22 0059)  methylPREDNISolone sodium succinate (SOLU-MEDROL) 125 mg/2 mL injection 125 mg (125 mg Intravenous Given 04/17/22 0001)  iohexol (OMNIPAQUE) 350 MG/ML injection 75 mL (75 mLs Intravenous Contrast Given 04/17/22 0150)  aspirin chewable tablet 324 mg (324 mg Oral Given 04/17/22 0141)     ED COURSE: Patient's labs show leukocytosis of 11.9.  Normal lactic.  COVID, flu and RSV negative.  Troponin minimally elevated likely from demand due to hypoxia and sepsis.  Chest x-ray reviewed and interpreted by myself and the radiologist and shows no acute abnormality.  Given her profound hypoxia, obtained a CTA of the chest which I also reviewed/interpreted and this shows no PE or opacity or edema.  She does report she is feeling better with oxygen after breathing treatments and is now coughing up thick green sputum.  She could have another viral URI or bacterial pneumonia that is not yet detected on imaging.  She is getting broad-spectrum antibiotics.  Her urine also appears grossly infected.  Culture pending.  Will discuss with hospitalist for admission.   CONSULTS:  Consulted and discussed patient's case with hospitalist, Dr. Damita Dunnings.  I have recommended admission and consulting physician agrees and will place admission orders.  Patient (and family if present) agree with this plan.   I reviewed all nursing notes, vitals, pertinent previous records.  All labs, EKGs, imaging ordered have been independently reviewed and interpreted by myself.    OUTSIDE RECORDS REVIEWED: Reviewed last internal medicine note on 02/27/2022.       FINAL CLINICAL IMPRESSION(S) / ED DIAGNOSES   Final diagnoses:  Acute  sepsis ([place])  Acute respiratory failure with hypoxia (HCC)  Acute UTI  Acute cough     Rx / DC Orders   ED  Discharge Orders     None        Note:  This document was prepared using Dragon voice recognition software and may include unintentional dictation errors.   Caroline Matters, Delice Bison, DO 04/17/22 [phone]

## 2022-04-16 NOTE — Progress Notes (Signed)
PHARMACY -  BRIEF ANTIBIOTIC NOTE   Pharmacy has received consult(s) for Vancomycin from an ED provider.  The patient's profile has been reviewed for ht/wt/allergies/indication/available labs.    One time order(s) placed for Vancomycin 2 gm per pt wt: 86.2 kg  Further antibiotics/pharmacy consults should be ordered by admitting physician if indicated.                       Thank you, Renda Rolls, PharmD, [facility] 04/16/2022 11:37 PM

## 2022-04-16 NOTE — Progress Notes (Signed)
CODE SEPSIS - PHARMACY COMMUNICATION  **Broad Spectrum Antibiotics should be administered within 1 hour of Sepsis diagnosis**  Time Code Sepsis Called/Page Received: 2336  Antibiotics Ordered: Aztreonam, Flagyl & Vancomycin  Time of 1st antibiotic administration: 0028  Renda Rolls, PharmD, [facility] 04/16/2022 11:38 PM

## 2022-04-16 NOTE — Sepsis Progress Note (Signed)
Following for sepsis monitoring ?

## 2022-04-16 NOTE — ED Triage Notes (Signed)
Pt presents ambulatory to triage via POV with complaints of SOB for that started today. Pt is diaphoretic and hypoxic in triage -79% on RA; placed on 6L [place] and her sats improved to 95%. Pt has a productive cough and has been treating this with tylenol. A&Ox4 at this time. Denies CP.

## 2022-04-17 ENCOUNTER — Other Ambulatory Visit: Payer: Self-pay

## 2022-04-17 ENCOUNTER — Emergency Department: Payer: PPO

## 2022-04-17 DIAGNOSIS — J209 Acute bronchitis, unspecified: Secondary | ICD-10-CM | POA: Diagnosis present

## 2022-04-17 DIAGNOSIS — J40 Bronchitis, not specified as acute or chronic: Secondary | ICD-10-CM | POA: Insufficient documentation

## 2022-04-17 DIAGNOSIS — I10 Essential (primary) hypertension: Secondary | ICD-10-CM | POA: Diagnosis present

## 2022-04-17 DIAGNOSIS — E039 Hypothyroidism, unspecified: Secondary | ICD-10-CM | POA: Diagnosis present

## 2022-04-17 DIAGNOSIS — K219 Gastro-esophageal reflux disease without esophagitis: Secondary | ICD-10-CM | POA: Diagnosis present

## 2022-04-17 DIAGNOSIS — Z9049 Acquired absence of other specified parts of digestive tract: Secondary | ICD-10-CM | POA: Diagnosis not present

## 2022-04-17 DIAGNOSIS — E785 Hyperlipidemia, unspecified: Secondary | ICD-10-CM | POA: Diagnosis present

## 2022-04-17 DIAGNOSIS — Z7901 Long term (current) use of anticoagulants: Secondary | ICD-10-CM | POA: Diagnosis not present

## 2022-04-17 DIAGNOSIS — J9601 Acute respiratory failure with hypoxia: Secondary | ICD-10-CM | POA: Diagnosis present

## 2022-04-17 DIAGNOSIS — Z833 Family history of diabetes mellitus: Secondary | ICD-10-CM | POA: Diagnosis not present

## 2022-04-17 DIAGNOSIS — I480 Paroxysmal atrial fibrillation: Secondary | ICD-10-CM | POA: Diagnosis present

## 2022-04-17 DIAGNOSIS — F419 Anxiety disorder, unspecified: Secondary | ICD-10-CM | POA: Diagnosis present

## 2022-04-17 DIAGNOSIS — Z1152 Encounter for screening for COVID-19: Secondary | ICD-10-CM | POA: Diagnosis not present

## 2022-04-17 DIAGNOSIS — Z881 Allergy status to other antibiotic agents status: Secondary | ICD-10-CM | POA: Diagnosis not present

## 2022-04-17 DIAGNOSIS — J8410 Pulmonary fibrosis, unspecified: Secondary | ICD-10-CM | POA: Diagnosis present

## 2022-04-17 DIAGNOSIS — G4733 Obstructive sleep apnea (adult) (pediatric): Secondary | ICD-10-CM | POA: Diagnosis present

## 2022-04-17 DIAGNOSIS — Z882 Allergy status to sulfonamides status: Secondary | ICD-10-CM | POA: Diagnosis not present

## 2022-04-17 DIAGNOSIS — Z888 Allergy status to other drugs, medicaments and biological substances status: Secondary | ICD-10-CM | POA: Diagnosis not present

## 2022-04-17 DIAGNOSIS — Z9851 Tubal ligation status: Secondary | ICD-10-CM | POA: Diagnosis not present

## 2022-04-17 DIAGNOSIS — F32A Depression, unspecified: Secondary | ICD-10-CM | POA: Diagnosis present

## 2022-04-17 DIAGNOSIS — N390 Urinary tract infection, site not specified: Secondary | ICD-10-CM | POA: Diagnosis present

## 2022-04-17 DIAGNOSIS — Z79899 Other long term (current) drug therapy: Secondary | ICD-10-CM | POA: Diagnosis not present

## 2022-04-17 DIAGNOSIS — K589 Irritable bowel syndrome without diarrhea: Secondary | ICD-10-CM | POA: Diagnosis present

## 2022-04-17 DIAGNOSIS — Z880 Allergy status to penicillin: Secondary | ICD-10-CM | POA: Diagnosis not present

## 2022-04-17 DIAGNOSIS — E119 Type 2 diabetes mellitus without complications: Secondary | ICD-10-CM | POA: Diagnosis present

## 2022-04-17 LAB — RESPIRATORY PANEL BY PCR

## 2022-04-17 LAB — COMPREHENSIVE METABOLIC PANEL
ALT: 40 U/L (ref 0–44)
AST: 47 U/L — ABNORMAL HIGH (ref 15–41)
Albumin: 3.7 g/dL (ref 3.5–5.0)
Alkaline Phosphatase: 86 U/L (ref 38–126)
Anion gap: 10 (ref 5–15)
BUN: 17 mg/dL (ref 8–23)
CO2: 29 mmol/L (ref 22–32)
Calcium: 9.2 mg/dL (ref 8.9–10.3)
Chloride: 101 mmol/L (ref 98–111)
Creatinine, Ser: 0.98 mg/dL (ref 0.44–1.00)
GFR, Estimated: >60 mL/min (ref >60)
Glucose, Bld: 191 mg/dL — ABNORMAL HIGH (ref 70–99)
Potassium: 3.8 mmol/L (ref 3.5–5.1)
Sodium: 140 mmol/L (ref 135–145)
Total Bilirubin: 0.3 mg/dL (ref 0.3–1.2)
Total Protein: 6.9 g/dL (ref 6.5–8.1)

## 2022-04-17 LAB — URINALYSIS, COMPLETE (UACMP) WITH MICROSCOPIC
Bilirubin Urine: NEGATIVE
Glucose, UA: NEGATIVE mg/dL
Hgb urine dipstick: NEGATIVE
Ketones, ur: NEGATIVE mg/dL
Nitrite: NEGATIVE
Protein, ur: 30 mg/dL — AB
Specific Gravity, Urine: 1.018 (ref 1.005–1.030)
WBC, UA: >50 WBC/hpf (ref 0–5)
pH: 5 (ref 5.0–8.0)

## 2022-04-17 LAB — CBC WITH DIFFERENTIAL/PLATELET
Abs Immature Granulocytes: 0.07 10*3/uL (ref 0.00–0.07)
Basophils Absolute: 0.1 10*3/uL (ref 0.0–0.1)
Basophils Relative: 1 %
Eosinophils Absolute: 0.2 10*3/uL (ref 0.0–0.5)
Eosinophils Relative: 2 %
HCT: 38.6 % (ref 36.0–46.0)
Hemoglobin: 11.5 g/dL — ABNORMAL LOW (ref 12.0–15.0)
Immature Granulocytes: 1 %
Lymphocytes Relative: 19 %
Lymphs Abs: 2.2 10*3/uL (ref 0.7–4.0)
MCH: 27.6 pg (ref 26.0–34.0)
MCHC: 29.8 g/dL — ABNORMAL LOW (ref 30.0–36.0)
MCV: 92.6 fL (ref 80.0–100.0)
Monocytes Absolute: 0.6 10*3/uL (ref 0.1–1.0)
Monocytes Relative: 5 %
Neutro Abs: 8.5 10*3/uL — ABNORMAL HIGH (ref 1.7–7.7)
Neutrophils Relative %: 72 %
Platelets: 274 10*3/uL (ref 150–400)
RBC: 4.17 MIL/uL (ref 3.87–5.11)
RDW: 14.6 % (ref 11.5–15.5)
WBC: 11.6 10*3/uL — ABNORMAL HIGH (ref 4.0–10.5)
nRBC: 0 % (ref 0.0–0.2)

## 2022-04-17 LAB — GLUCOSE, CAPILLARY
Glucose-Capillary: 185 mg/dL — ABNORMAL HIGH (ref 70–99)
Glucose-Capillary: 217 mg/dL — ABNORMAL HIGH (ref 70–99)
Glucose-Capillary: 182 mg/dL — ABNORMAL HIGH (ref 70–99)
Glucose-Capillary: 172 mg/dL — ABNORMAL HIGH (ref 70–99)

## 2022-04-17 LAB — PROTIME-INR
INR: 1.3 — ABNORMAL HIGH (ref 0.8–1.2)
Prothrombin Time: 15.6 seconds — ABNORMAL HIGH (ref 11.4–15.2)

## 2022-04-17 LAB — TROPONIN I (HIGH SENSITIVITY)
Troponin I (High Sensitivity): 78 ng/L — ABNORMAL HIGH (ref <18)
Troponin I (High Sensitivity): 85 ng/L — ABNORMAL HIGH (ref <18)

## 2022-04-17 LAB — BLOOD GAS, VENOUS
Acid-Base Excess: 3.2 mmol/L — ABNORMAL HIGH (ref 0.0–2.0)
Bicarbonate: 30.9 mmol/L — ABNORMAL HIGH (ref 20.0–28.0)
O2 Saturation: 79.8 %
Patient temperature: 37
pCO2, Ven: 60 mmHg (ref 44–60)
pH, Ven: 7.32 (ref 7.25–7.43)
pO2, Ven: 48 mmHg — ABNORMAL HIGH (ref 32–45)

## 2022-04-17 LAB — LACTIC ACID, PLASMA: Lactic Acid, Venous: 1.7 mmol/L (ref 0.5–1.9)

## 2022-04-17 LAB — RESP PANEL BY RT-PCR (RSV, FLU A&B, COVID)  RVPGX2
Influenza A by PCR: NEGATIVE
Influenza B by PCR: NEGATIVE
Resp Syncytial Virus by PCR: NEGATIVE
SARS Coronavirus 2 by RT PCR: NEGATIVE

## 2022-04-17 LAB — BRAIN NATRIURETIC PEPTIDE: B Natriuretic Peptide: 179.9 pg/mL — ABNORMAL HIGH (ref 0.0–100.0)

## 2022-04-17 LAB — HIV ANTIBODY (ROUTINE TESTING W REFLEX): HIV Screen 4th Generation wRfx: NONREACTIVE

## 2022-04-17 LAB — APTT: aPTT: 37 seconds — ABNORMAL HIGH (ref 24–36)

## 2022-04-17 LAB — PROCALCITONIN: Procalcitonin: <0.1 ng/mL

## 2022-04-17 MED ORDER — LOSARTAN POTASSIUM 25 MG PO TABS
25.0000 mg | ORAL_TABLET | Freq: Every day | ORAL | Status: DC
  Administered 2022-04-17 – 2022-04-19 (×3): 25 mg via ORAL
  Filled 2022-04-17 (×3): qty 1

## 2022-04-17 MED ORDER — SODIUM CHLORIDE 0.9 % IV SOLN
500.0000 mg | INTRAVENOUS | Status: DC
  Administered 2022-04-17 – 2022-04-19 (×3): 500 mg via INTRAVENOUS
  Filled 2022-04-17 (×4): qty 5

## 2022-04-17 MED ORDER — PAROXETINE HCL ER 12.5 MG PO TB24
25.0000 mg | ORAL_TABLET | Freq: Every day | ORAL | Status: DC
  Administered 2022-04-17 – 2022-04-19 (×3): 25 mg via ORAL
  Filled 2022-04-17 (×4): qty 2

## 2022-04-17 MED ORDER — IPRATROPIUM-ALBUTEROL 0.5-2.5 (3) MG/3ML IN SOLN
3.0000 mL | Freq: Four times a day (QID) | RESPIRATORY_TRACT | Status: DC
  Administered 2022-04-17: 3 mL via RESPIRATORY_TRACT
  Filled 2022-04-17: qty 3

## 2022-04-17 MED ORDER — ATORVASTATIN CALCIUM 20 MG PO TABS
40.0000 mg | ORAL_TABLET | Freq: Every day | ORAL | Status: DC
  Administered 2022-04-17 – 2022-04-19 (×3): 40 mg via ORAL
  Filled 2022-04-17 (×3): qty 2

## 2022-04-17 MED ORDER — LEVOTHYROXINE SODIUM 50 MCG PO TABS
175.0000 ug | ORAL_TABLET | Freq: Every day | ORAL | Status: DC
  Administered 2022-04-17 – 2022-04-19 (×3): 175 ug via ORAL
  Filled 2022-04-17 (×2): qty 1
  Filled 2022-04-17: qty 4

## 2022-04-17 MED ORDER — ACETAMINOPHEN 325 MG PO TABS
650.0000 mg | ORAL_TABLET | Freq: Four times a day (QID) | ORAL | Status: DC | PRN
  Administered 2022-04-17 – 2022-04-19 (×3): 650 mg via ORAL
  Filled 2022-04-17 (×4): qty 2

## 2022-04-17 MED ORDER — PREDNISONE 20 MG PO TABS
40.0000 mg | ORAL_TABLET | Freq: Every day | ORAL | Status: DC
  Administered 2022-04-18 – 2022-04-19 (×2): 40 mg via ORAL
  Filled 2022-04-17 (×2): qty 2

## 2022-04-17 MED ORDER — ACETAMINOPHEN 650 MG RE SUPP: 650.0000 mg | Freq: Four times a day (QID) | RECTAL | Status: DC | PRN

## 2022-04-17 MED ORDER — INSULIN ASPART 100 UNIT/ML IJ SOLN: 0.0000 [IU] | Freq: Every day | INTRAMUSCULAR | Status: DC

## 2022-04-17 MED ORDER — INSULIN ASPART 100 UNIT/ML IJ SOLN
0.0000 [IU] | Freq: Three times a day (TID) | INTRAMUSCULAR | Status: DC
  Administered 2022-04-17: 3 [IU] via SUBCUTANEOUS
  Administered 2022-04-17: 5 [IU] via SUBCUTANEOUS
  Administered 2022-04-17 – 2022-04-19 (×5): 3 [IU] via SUBCUTANEOUS
  Filled 2022-04-17 (×7): qty 1

## 2022-04-17 MED ORDER — FLUTICASONE PROPIONATE 50 MCG/ACT NA SUSP
1.0000 | Freq: Every day | NASAL | Status: DC
  Administered 2022-04-17 – 2022-04-19 (×3): 1 via NASAL
  Filled 2022-04-17: qty 16

## 2022-04-17 MED ORDER — GUAIFENESIN-CODEINE 100-10 MG/5ML PO SYRP: 5.0000 mL | ORAL_SOLUTION | Freq: Three times a day (TID) | ORAL | Status: DC | PRN

## 2022-04-17 MED ORDER — ONDANSETRON HCL 4 MG/2ML IJ SOLN
4.0000 mg | Freq: Four times a day (QID) | INTRAMUSCULAR | Status: DC | PRN
  Administered 2022-04-17 – 2022-04-18 (×3): 4 mg via INTRAVENOUS
  Filled 2022-04-17 (×3): qty 2

## 2022-04-17 MED ORDER — METHYLPREDNISOLONE SODIUM SUCC 40 MG IJ SOLR
40.0000 mg | Freq: Two times a day (BID) | INTRAMUSCULAR | Status: AC
  Administered 2022-04-17 (×2): 40 mg via INTRAVENOUS
  Filled 2022-04-17 (×2): qty 1

## 2022-04-17 MED ORDER — ASPIRIN 81 MG PO CHEW
324.0000 mg | CHEWABLE_TABLET | Freq: Once | ORAL | Status: AC
  Administered 2022-04-17: 324 mg via ORAL
  Filled 2022-04-17: qty 4

## 2022-04-17 MED ORDER — METOPROLOL SUCCINATE ER 25 MG PO TB24
12.5000 mg | ORAL_TABLET | Freq: Every day | ORAL | Status: DC
  Administered 2022-04-17 – 2022-04-19 (×3): 12.5 mg via ORAL
  Filled 2022-04-17 (×3): qty 1

## 2022-04-17 MED ORDER — GABAPENTIN 100 MG PO CAPS
100.0000 mg | ORAL_CAPSULE | Freq: Three times a day (TID) | ORAL | Status: DC
  Administered 2022-04-17 – 2022-04-19 (×7): 100 mg via ORAL
  Filled 2022-04-17 (×7): qty 1

## 2022-04-17 MED ORDER — SODIUM CHLORIDE 0.9 % IV SOLN: INTRAVENOUS | Status: AC

## 2022-04-17 MED ORDER — POTASSIUM CHLORIDE CRYS ER 10 MEQ PO TBCR
15.0000 meq | EXTENDED_RELEASE_TABLET | Freq: Two times a day (BID) | ORAL | Status: DC
  Administered 2022-04-17 – 2022-04-19 (×5): 15 meq via ORAL
  Filled 2022-04-17 (×5): qty 2

## 2022-04-17 MED ORDER — ALBUTEROL SULFATE (2.5 MG/3ML) 0.083% IN NEBU
2.5000 mg | INHALATION_SOLUTION | RESPIRATORY_TRACT | Status: DC | PRN
  Administered 2022-04-18 – 2022-04-19 (×2): 2.5 mg via RESPIRATORY_TRACT
  Filled 2022-04-17 (×2): qty 3

## 2022-04-17 MED ORDER — IOHEXOL 350 MG/ML SOLN
75.0000 mL | Freq: Once | INTRAVENOUS | Status: AC | PRN
  Administered 2022-04-17: 75 mL via INTRAVENOUS

## 2022-04-17 MED ORDER — SODIUM CHLORIDE 0.9 % IV SOLN
2.0000 g | INTRAVENOUS | Status: DC
  Administered 2022-04-17 – 2022-04-19 (×3): 2 g via INTRAVENOUS
  Filled 2022-04-17: qty 20
  Filled 2022-04-17: qty 2
  Filled 2022-04-17 (×2): qty 20

## 2022-04-17 MED ORDER — KETOROLAC TROMETHAMINE 15 MG/ML IJ SOLN
15.0000 mg | Freq: Once | INTRAMUSCULAR | Status: AC
  Administered 2022-04-17: 15 mg via INTRAVENOUS
  Filled 2022-04-17: qty 1

## 2022-04-17 MED ORDER — FUROSEMIDE 20 MG PO TABS
20.0000 mg | ORAL_TABLET | Freq: Every day | ORAL | Status: DC
  Administered 2022-04-17 – 2022-04-19 (×3): 20 mg via ORAL
  Filled 2022-04-17 (×3): qty 1

## 2022-04-17 MED ORDER — ONDANSETRON HCL 4 MG PO TABS: 4.0000 mg | ORAL_TABLET | Freq: Four times a day (QID) | ORAL | Status: DC | PRN

## 2022-04-17 MED ORDER — GUAIFENESIN ER 600 MG PO TB12
600.0000 mg | ORAL_TABLET | Freq: Two times a day (BID) | ORAL | Status: DC
  Administered 2022-04-17 – 2022-04-19 (×5): 600 mg via ORAL
  Filled 2022-04-17 (×5): qty 1

## 2022-04-17 MED ORDER — APIXABAN 5 MG PO TABS
5.0000 mg | ORAL_TABLET | Freq: Two times a day (BID) | ORAL | Status: DC
  Administered 2022-04-17 – 2022-04-19 (×5): 5 mg via ORAL
  Filled 2022-04-17 (×5): qty 1

## 2022-04-17 MED ORDER — AMIODARONE HCL 200 MG PO TABS
200.0000 mg | ORAL_TABLET | Freq: Every day | ORAL | Status: DC
  Administered 2022-04-17 – 2022-04-19 (×3): 200 mg via ORAL
  Filled 2022-04-17 (×3): qty 1

## 2022-04-17 NOTE — Assessment & Plan Note (Signed)
-  Continue home metoprolol, losartan and Lasix

## 2022-04-17 NOTE — Assessment & Plan Note (Signed)
Patient with new oxygen requirement of up to 4 L of oxygen with no baseline use.  Most likely secondary to acute bronchitis and pulmonary fibrosis exacerbation with some viral illness. -New supplemental oxygen -Wean as tolerated

## 2022-04-17 NOTE — Progress Notes (Signed)
Progress Note   Patient: Kirsten [name] [id number] DOB: 1955-09-12 DOA: 04/16/2022     0 DOS: the patient was seen and examined on 04/17/2022   Brief hospital course: Taken from H&P.  KAYLEEN JACKLIN is a 67 y.o. female with medical history significant of hypertension, , diabetes mellitus, asthma, GERD, OSA on CPAP, hypothyroidism, pulmonary fibrosis, IBS, atrial fibrillation on Eliquis and amiodarone,  who presents with shortness of breath for 5 days, progressively worsening.  Patient has cough, congestion and nasal drainage with greenish-yellow sputum production.  Also reports low-grade fevers and chills.  No chest pain.  No nausea, vomiting, diarrhea or abdominal pain.  No UTI symptoms.  Patient does not use any oxygen at baseline.  ED course and data review: Tachypneic to 30 with O2 sat 79% on room air requiring 2 L to maintain sats in the high 90s.  VBG with pH 7.32 and pCO2 60.  Troponin 85.  BNP 179.  Respiratory viral panel negative.  WBC slightly elevated at 11,600.  Urinalysis showed moderate leukocyte esterase EKG, independent clean interpreted showing sinus at 96 with nonspecific ST-T wave changes CTA PE negative for PE, showing cardiomegaly and bibasilar atelectasis Patient treated with DuoNebs and methylprednisolone. Due to borderline sepsis criteria she was started on IV fluid bolus, aztreonam and vancomycin and Flagyl.  Sputum culture and blood culture sent.   Patient did not meet sepsis criteria-sepsis ruled out.  Troponin with flat curve.  Preliminary blood cultures negative.  Pending urine cultures.  UA was only positive for leukocyte esterase. Procalcitonin negative.  Checking respiratory viral panel.      Assessment and Plan: * Acute respiratory failure with hypoxia ([place]) Patient with new oxygen requirement of up to 4 L of oxygen with no baseline use.  Most likely secondary to acute bronchitis and pulmonary fibrosis exacerbation with some viral illness. -New  supplemental oxygen -Wean as tolerated  Acute bronchitis Patient with history of pulmonary fibrosis, developed acute bronchitis and sinusitis, symptoms more consistent with some viral illness.  Respiratory panel negative for COVID, influenza and RSV.  Checking respiratory viral panel. Procalcitonin negative. -Continue with Solu-Medrol -Continue with Zithromax for its anti-inflammatory effect on lungs -Continue with supportive care  Pulmonary fibrosis (HCC) Please see above  Hypothyroidism -Continue home Synthroid  Type 2 diabetes mellitus without complications (HCC) Recent A1c 6.7, well controlled.  Patient taking metformin -Sliding scale insulin  Depression with anxiety -Continue home medications  Paroxysmal atrial fibrillation (HCC) -Continue home amiodarone, metoprolol and Eliquis  Essential hypertension, benign -Continue home metoprolol, losartan and Lasix  UTI (urinary tract infection) UA concerning for UTI.  No significant urinary symptoms except mildly increased urinary frequency.  Urine cultures pending -Continue with ceftriaxone -Follow-up urine cultures  OSA on CPAP -Continue CPAP at night   Subjective: Patient continued to feel little short of breath when seen during morning rounds.  She started with having diarrhea followed by upper respiratory symptoms a week ago.  Yes symptoms has been completely resolved.  No specific urinary symptoms except mildly increased urinary frequency.  Physical Exam: Vitals:   04/17/22 0454 04/17/22 0808 04/17/22 0839 04/17/22 1220  BP: 130/69  (!) 120/56 126/65  Pulse: 73  72 71  Resp: '20  18 18  '$ Temp: 98.5 F (36.9 C)  98.2 F (36.8 C) 98.4 F (36.9 C)  TempSrc:      SpO2: 98% 96% 94% 97%  Weight:      Height:       General.  Well-developed  lady, in no acute distress. Pulmonary.  Mildly decreased air entry bilaterally, normal respiratory effort. CV.  Regular rate and rhythm, no JVD, rub or murmur. Abdomen.  Soft,  nontender, nondistended, BS positive. CNS.  Alert and oriented .  No focal neurologic deficit. Extremities.  No edema, no cyanosis, pulses intact and symmetrical. Psychiatry.  Judgment and insight appears normal.   Data Reviewed: Prior data reviewed  Family Communication: Discussed with husband on phone. He wants to involve her pulmonologist Dr. Patsey Berthold.  Disposition: Status is: Inpatient Remains inpatient appropriate because: Severity of illness  Planned Discharge Destination: Home  DVT prophylaxis. Eliquis Time spent:  minutes  This record has been created using Systems analyst. Errors have been sought and corrected,but may not always be located. Such creation errors do not reflect on the standard of care.   Author: Lorella Nimrod, MD 04/17/2022 3:06 PM  For on call review [email or web address].

## 2022-04-17 NOTE — Assessment & Plan Note (Signed)
-   Continue home Synthroid °

## 2022-04-17 NOTE — Assessment & Plan Note (Signed)
Continue home medications. 

## 2022-04-17 NOTE — Assessment & Plan Note (Signed)
UA concerning for UTI.  No significant urinary symptoms except mildly increased urinary frequency.  Urine cultures pending -Continue with ceftriaxone -Follow-up urine cultures

## 2022-04-17 NOTE — Assessment & Plan Note (Signed)
-  Continue home amiodarone, metoprolol and Eliquis

## 2022-04-17 NOTE — Assessment & Plan Note (Signed)
Please see above

## 2022-04-17 NOTE — Assessment & Plan Note (Signed)
Continue CPAP at night ?

## 2022-04-17 NOTE — H&P (Signed)
History and Physical    Patient: Kirsten [name] [id number] DOB: Aug 13, 1955 DOA: 04/16/2022 DOS: the patient was seen and examined on 04/17/2022 PCP: [name], MD  Patient coming from: Home  Chief Complaint:  Chief Complaint  Patient presents with   Shortness of Breath   HPI: Kirsten [name] is a 67 y.o. female with medical history significant of hypertension, , diabetes mellitus, asthma, GERD, OSA on CPAP, hypothyroidism, pulmonary fibrosis, IBS, atrial fibrillation on Eliquis and amiodarone,  who presents with shortness of breath. Patient states that she has shortness of breath for more than 5 days, which has been progressively worsening.  Patient has cough, congestion and nasal drainage with greenish yellow sputum production.  Patient also reports low grade subjective fever and chills.  No chest pain.  Denies nausea, vomiting, diarrhea or abdominal pain.  No symptoms of UTI.  Patient stated he has been consistently taking Eliquis, last dose was yesterday.  Patient is not wearing oxygen normally, but was found to have oxygen desaturating to 79% on room air  ED course and data review: Tachypneic to 30 with O2 sat 79% on room air requiring 2 L to maintain sats in the high 90s.  VBG with pH 7.32 and pCO2 60.  Troponin 85.  BNP 179.  Respiratory viral panel negative.  WBC slightly elevated at 11,600.  Urinalysis showed moderate leukocyte esterase EKG, independent clean interpreted showing sinus at 96 with nonspecific ST-T wave changes CTA PE negative for PE, showing cardiomegaly and bibasilar atelectasis Patient treated with DuoNebs and methylprednisolone. Due to borderline sepsis criteria she was started on IV fluid bolus, aztreonam and vancomycin and Flagyl.  Sputum culture and blood culture sent.  Hospitalist consulted for admission.   Review of Systems: As mentioned in the history of present illness. All other systems reviewed and are negative. Past Medical History:  Diagnosis Date    A-fib ([place])    Anxiety disorder    Asthma    Dyspnea    Gastroschisis    umphalocele, rotated organs abd repair until age 87   GERD (gastroesophageal reflux disease)    HTN (hypertension)    Hyperlipidemia    IBS (irritable bowel syndrome)    Morbid obesity (HCC)    Target wt - 185  for BMI < 30   Obesity    OSA on CPAP    SBO (small bowel obstruction) ([place])    Resolved with NG/Bowel rest around 2009   Sleep apnea    Type II or unspecified type diabetes mellitus without mention of complication, not stated as uncontrolled    Past Surgical History:  Procedure Laterality Date   BREAST EXCISIONAL BIOPSY Left 03/19/2012   neg   CESAREAN SECTION     CHOLECYSTECTOMY  1992   COLONOSCOPY  2011   2011-normal   Newborn Surgery - GI - ORGANS OUTSIDE ABDOMEN     RIGHT/LEFT HEART CATH AND CORONARY ANGIOGRAPHY N/A 10/02/2021   Procedure: RIGHT/LEFT HEART CATH AND CORONARY ANGIOGRAPHY;  Surgeon: Isaias Cowman, MD;  Location: [facility];  Service: Cardiovascular;  Laterality: N/A;   Small Bowel Repair     TUBAL LIGATION  1988   Social History:  reports that she has never smoked. She has never used smokeless tobacco. She reports current alcohol use of about 2.0 standard drinks of alcohol per week. She reports that she does not use drugs.  Allergies  Allergen Reactions   Doxycycline Rash   Factive [Gemifloxacin Mesylate] Rash   Crestor [  Rosuvastatin]     GI upset   Sulfonamide Derivatives     REACTION: rash   Gemifloxacin Rash   Penicillins Hives and Rash    Has patient had a PCN reaction causing immediate rash, facial/tongue/throat swelling, SOB or lightheadedness with hypotension: No Has patient had a PCN reaction causing severe rash involving mucus membranes or skin necrosis: No Has patient had a PCN reaction that required hospitalization No Has patient had a PCN reaction occurring within the last 10 years: No If all of the above answers are "NO", then may proceed  with Cephalosporin use.     Family History  Problem Relation Age of Onset   Lymphoma Mother    Diabetes type II Father    Colon cancer Father    Diabetes Maternal Grandmother    Diabetes type I Son    Goiter Neg Hx    Breast cancer Neg Hx    Ovarian cancer Neg Hx     Prior to Admission medications   Medication Sig Start Date End Date Taking? Authorizing Provider  apixaban (ELIQUIS) 5 MG TABS tablet Take 1 tablet (5 mg total) by mouth 2 (two) times daily. 02/27/22  Yes [name], MD  amiodarone (PACERONE) 200 MG tablet Take 1 tablet (200 mg total) by mouth daily. 03/07/20   Max Sane, MD  atorvastatin (LIPITOR) 40 MG tablet TAKE ONE TABLET BY MOUTH ONE TIME DAILY 02/27/22   [name], MD  Continuous Blood Gluc Sensor (FREESTYLE LIBRE 3 SENSOR) MISC PLACE ONE SENSOR ON THE BACK OF YOUR UPPER ARM EVERY 14 DAYS 09/29/21   [name], MD  cyanocobalamin (,VITAMIN B-12,) 1000 MCG/ML injection INJECT 1ML INTO THE MUSCLE ONCE A MONTH 08/21/21   Patel, Donika K, DO  furosemide (LASIX) 20 MG tablet Take 20 mg by mouth daily.    [provider]  gabapentin (NEURONTIN) 100 MG capsule Take 100 mg by mouth 3 times a day. May add 100 mg every 3 to 5 days until taking 200 mg 3 times daily. 04/15/22   [provider]  guaiFENesin-codeine (GUAIFENESIN AC) 100-10 MG/5ML syrup Take 5 mLs by mouth every 8 (eight) hours as needed for cough.    [provider]  levothyroxine (SYNTHROID) 175 MCG tablet TAKE ONE TABLET BY MOUTH ONE TIME DAILY 01/25/22   [name], MD  losartan (COZAAR) 25 MG tablet Take 1 tablet (25 mg total) by mouth daily. 10/07/21 11/06/21  Wyvonnia Dusky, MD  metoprolol succinate (TOPROL-XL) 25 MG 24 hr tablet Take 0.5 tablets (12.5 mg total) by mouth daily. 10/07/21 11/06/21  Wyvonnia Dusky, MD  PARoxetine (PAXIL-CR) 25 MG 24 hr tablet TAKE 1 TABLET(25 MG) BY MOUTH DAILY 02/27/22   [name], MD  potassium chloride SA (KLOR-CON M15)  15 MEQ tablet Take 1 tablet (15 mEq total) by mouth 2 (two) times daily. 11/02/21   [name], MD  predniSONE (DELTASONE) 20 MG tablet Take 1 tablet (20 mg total) by mouth daily with breakfast. 10 days Patient not taking: Reported on 04/17/2022 01/12/22   Flora Lipps, MD    Physical Exam: Vitals:   04/17/22 0354 04/17/22 0400 04/17/22 0415 04/17/22 0454  BP: 118/64 (!) 117/57  130/69  Pulse: 68 70 69 73  Resp: '13 15 13 20  '$ Temp: 98 F (36.7 C)   98.5 F (36.9 C)  TempSrc: Oral     SpO2: 98% 96% 97% 98%  Weight:      Height:  Physical Exam Vitals and nursing note reviewed.  Constitutional:      General: She is not in acute distress. HENT:     Head: Normocephalic and atraumatic.  Cardiovascular:     Rate and Rhythm: Normal rate and regular rhythm.     Heart sounds: Normal heart sounds.  Pulmonary:     Effort: Pulmonary effort is normal.     Breath sounds: Normal breath sounds.     Comments: Diminished breath sounds Abdominal:     Palpations: Abdomen is soft.     Tenderness: There is no abdominal tenderness.  Neurological:     Mental Status: Mental status is at baseline.     Data Reviewed: Relevant notes from primary care and specialist visits, past discharge summaries as available in EHR, including Care Everywhere. Prior diagnostic testing as pertinent to current admission diagnoses Updated medications and problem lists for reconciliation ED course, including vitals, labs, imaging, treatment and response to treatment Triage notes, nursing and pharmacy notes and ED provider's notes Notable results as noted in HPI  Results for orders placed or performed during the hospital encounter of 04/16/22 (from the past 24 hour(s))  Lactic acid, plasma     Status: None   Collection Time: 04/16/22 11:33 PM  Result Value Ref Range   Lactic Acid, Venous 1.7 0.5 - 1.9 mmol/L  Comprehensive metabolic panel     Status: Abnormal   Collection Time: 04/16/22 11:33 PM  Result  Value Ref Range   Sodium 140 135 - 145 mmol/L   Potassium 3.8 3.5 - 5.1 mmol/L   Chloride 101 98 - 111 mmol/L   CO2 29 22 - 32 mmol/L   Glucose, Bld 191 (H) 70 - 99 mg/dL   BUN 17 8 - 23 mg/dL   Creatinine, Ser 0.98 0.44 - 1.00 mg/dL   Calcium 9.2 8.9 - 10.3 mg/dL   Total Protein 6.9 6.5 - 8.1 g/dL   Albumin 3.7 3.5 - 5.0 g/dL   AST 47 (H) 15 - 41 U/L   ALT 40 0 - 44 U/L   Alkaline Phosphatase 86 38 - 126 U/L   Total Bilirubin 0.3 0.3 - 1.2 mg/dL   GFR, Estimated >60 >60 mL/min   Anion gap 10 5 - 15  CBC with Differential     Status: Abnormal   Collection Time: 04/16/22 11:33 PM  Result Value Ref Range   WBC 11.6 (H) 4.0 - 10.5 K/uL   RBC 4.17 3.87 - 5.11 MIL/uL   Hemoglobin 11.5 (L) 12.0 - 15.0 g/dL   HCT 38.6 36.0 - 46.0 %   MCV 92.6 80.0 - 100.0 fL   MCH 27.6 26.0 - 34.0 pg   MCHC 29.8 (L) 30.0 - 36.0 g/dL   RDW 14.6 11.5 - 15.5 %   Platelets 274 150 - 400 K/uL   nRBC 0.0 0.0 - 0.2 %   Neutrophils Relative % 72 %   Neutro Abs 8.5 (H) 1.7 - 7.7 K/uL   Lymphocytes Relative 19 %   Lymphs Abs 2.2 0.7 - 4.0 K/uL   Monocytes Relative 5 %   Monocytes Absolute 0.6 0.1 - 1.0 K/uL   Eosinophils Relative 2 %   Eosinophils Absolute 0.2 0.0 - 0.5 K/uL   Basophils Relative 1 %   Basophils Absolute 0.1 0.0 - 0.1 K/uL   Immature Granulocytes 1 %   Abs Immature Granulocytes 0.07 0.00 - 0.07 K/uL  Protime-INR     Status: Abnormal   Collection Time: 04/16/22 11:33 PM  Result Value Ref Range   Prothrombin Time 15.6 (H) 11.4 - 15.2 seconds   INR 1.3 (H) 0.8 - 1.2  APTT     Status: Abnormal   Collection Time: 04/16/22 11:33 PM  Result Value Ref Range   aPTT 37 (H) 24 - 36 seconds  Brain natriuretic peptide     Status: Abnormal   Collection Time: 04/16/22 11:33 PM  Result Value Ref Range   B Natriuretic Peptide 179.9 (H) 0.0 - 100.0 pg/mL  Troponin I (High Sensitivity)     Status: Abnormal   Collection Time: 04/16/22 11:33 PM  Result Value Ref Range   Troponin I (High  Sensitivity) 78 (H) <18 ng/L  Resp panel by RT-PCR (RSV, Flu A&B, Covid) Anterior Nasal Swab     Status: None   Collection Time: 04/16/22 11:48 PM   Specimen: Anterior Nasal Swab  Result Value Ref Range   SARS Coronavirus 2 by RT PCR NEGATIVE NEGATIVE   Influenza A by PCR NEGATIVE NEGATIVE   Influenza B by PCR NEGATIVE NEGATIVE   Resp Syncytial Virus by PCR NEGATIVE NEGATIVE  Blood gas, venous     Status: Abnormal   Collection Time: 04/17/22 12:16 AM  Result Value Ref Range   pH, Ven 7.32 7.25 - 7.43   pCO2, Ven 60 44 - 60 mmHg   pO2, Ven 48 (H) 32 - 45 mmHg   Bicarbonate 30.9 (H) 20.0 - 28.0 mmol/L   Acid-Base Excess 3.2 (H) 0.0 - 2.0 mmol/L   O2 Saturation 79.8 %   Patient temperature 37.0    Collection site VEIN   Urinalysis, Complete w Microscopic -Urine, Clean Catch     Status: Abnormal   Collection Time: 04/17/22  1:36 AM  Result Value Ref Range   Color, Urine YELLOW (A) YELLOW   APPearance HAZY (A) CLEAR   Specific Gravity, Urine 1.018 1.005 - 1.030   pH 5.0 5.0 - 8.0   Glucose, UA NEGATIVE NEGATIVE mg/dL   Hgb urine dipstick NEGATIVE NEGATIVE   Bilirubin Urine NEGATIVE NEGATIVE   Ketones, ur NEGATIVE NEGATIVE mg/dL   Protein, ur 30 (A) NEGATIVE mg/dL   Nitrite NEGATIVE NEGATIVE   Leukocytes,Ua LARGE (A) NEGATIVE   RBC / HPF 6-10 0 - 5 RBC/hpf   WBC, UA >50 0 - 5 WBC/hpf   Bacteria, UA RARE (A) NONE SEEN   Squamous Epithelial / HPF 0-5 0 - 5 /HPF   Mucus PRESENT    Hyaline Casts, UA PRESENT   Troponin I (High Sensitivity)     Status: Abnormal   Collection Time: 04/17/22  2:01 AM  Result Value Ref Range   Troponin I (High Sensitivity) 85 (H) <18 ng/L     Assessment and Plan:  Acute respiratory failure with hypoxia  Acute bronchitis Sinusitis Pulmonary fibrosis:  Patient has new oxygen requirement, currently on 4 L oxygen.  Does not meet criteria for sepsis.  Hemodynamically stable. - IV Rocephin and azithromycin - Solu-Medrol 40 mg twice daily - Mucinex  for cough  - Bronchodilators -CTA negative for pneumonia  UTI --Continue Rocephin   Hypothyroidism -Synthroid   Depression with anxiety -Continue home medications   OSA  -on CPAP   Paroxysmal atrial fibrillation (HCC) -Continue amiodarone, metoprolol -Continue Eliquis   Diabetes mellitus without complication ([place]): Recent A1c 6.7, well controlled.  Patient taking metformin -Sliding scale insulin      Advance Care Planning:   Code Status: Full Code   Consults:   Family Communication:   Severity  of Illness: The appropriate patient status for this patient is INPATIENT. Inpatient status is judged to be reasonable and necessary in order to provide the required intensity of service to ensure the patient's safety. The patient's presenting symptoms, physical exam findings, and initial radiographic and laboratory data in the context of their chronic comorbidities is felt to place them at high risk for further clinical deterioration. Furthermore, it is not anticipated that the patient will be medically stable for discharge from the hospital within 2 midnights of admission.   * I certify that at the point of admission it is my clinical judgment that the patient will require inpatient hospital care spanning beyond 2 midnights from the point of admission due to high intensity of service, high risk for further deterioration and high frequency of surveillance required.*  Author: Athena Masse, MD 04/17/2022 5:39 AM  For on call review [email or web address].

## 2022-04-17 NOTE — Hospital Course (Addendum)
Taken from H&P.  Kirsten [name] is a 67 y.o. female with medical history significant of hypertension, , diabetes mellitus, asthma, GERD, OSA on CPAP, hypothyroidism, pulmonary fibrosis, IBS, atrial fibrillation on Eliquis and amiodarone,  who presents with shortness of breath for 5 days, progressively worsening.  Patient has cough, congestion and nasal drainage with greenish-yellow sputum production.  Also reports low-grade fevers and chills.  No chest pain.  No nausea, vomiting, diarrhea or abdominal pain.  No UTI symptoms.  Patient does not use any oxygen at baseline.  ED course and data review: Tachypneic to 30 with O2 sat 79% on room air requiring 2 L to maintain sats in the high 90s.  VBG with pH 7.32 and pCO2 60.  Troponin 85.  BNP 179.  Respiratory viral panel negative.  WBC slightly elevated at 11,600.  Urinalysis showed moderate leukocyte esterase EKG, independent clean interpreted showing sinus at 96 with nonspecific ST-T wave changes CTA PE negative for PE, showing cardiomegaly and bibasilar atelectasis Patient treated with DuoNebs and methylprednisolone. Due to borderline sepsis criteria she was started on IV fluid bolus, aztreonam and vancomycin and Flagyl.  Sputum culture and blood culture sent.   Patient did not meet sepsis criteria-sepsis ruled out.  Troponin with flat curve.  Preliminary blood cultures negative.  Pending urine cultures.  UA was only positive for leukocyte esterase. Procalcitonin negative.  Checking respiratory viral panel.  3/6: Vital stable, still on 4 L of oxygen.  Respiratory viral panel negative.  CBG elevated as patient is on steroid.  Urine cultures with multiple species suggesting recollection which will be useless as patient is already on antibiotics.  Blood cultures remain negative.  3/7: Vitals stable, saturating 99% on 2 L of oxygen.  Patient was ambulated and continue to require 2 L of oxygen with ambulation.  She was desaturating in high 80s with  ambulation.  Patient was provided with home oxygen and her pulmonologist can determine whether she need to continue or not.  She was also started on Symbicort and Flonase. Patient completed 3-day course of ceftriaxone for concern of UTI. She also received 3 days of Zithromax.  Being discharged on 2 more days of prednisone.  She will continue the rest of her home medications and need to have a close follow-up with her providers for further recommendations.

## 2022-04-17 NOTE — Assessment & Plan Note (Signed)
Recent A1c 6.7, well controlled.  Patient taking metformin -Sliding scale insulin

## 2022-04-17 NOTE — TOC Initial Note (Signed)
Transition of Care [facility]) - Initial/Assessment Note    Patient Details  [name] MRN: [id number] Date of Birth: 31-Jan-1956  Transition of Care [facility]) CM/SW Contact:    Laurena Slimmer, RN Phone Number: 04/17/2022, 3:11 PM  Clinical Narrative:                  Transition of Care (TOC) Screening Note   Patient Details  [name] Date of Birth: June 02, 1955   Transition of Care [facility]) CM/SW Contact:    Laurena Slimmer, RN Phone Number: 04/17/2022, 3:11 PM    Transition of Care Department [facility]) has reviewed patient and no TOC needs have been identified at this time. We will continue to monitor patient advancement through interdisciplinary progression rounds. If new patient transition needs arise, please place a TOC consult.          Patient Goals and CMS Choice            Expected Discharge Plan and Services                                              Prior Living Arrangements/Services                       Activities of Daily Living Home Assistive Devices/Equipment: Eyeglasses, Other (Comment) (pulse ox) ADL Screening (condition at time of admission) Patient's cognitive ability adequate to safely complete daily activities?: Yes Is the patient deaf or have difficulty hearing?: No Does the patient have difficulty seeing, even when wearing glasses/contacts?: No Does the patient have difficulty concentrating, remembering, or making decisions?: No Patient able to express need for assistance with ADLs?: Yes Does the patient have difficulty dressing or bathing?: No Independently performs ADLs?: Yes (appropriate for developmental age) Does the patient have difficulty walking or climbing stairs?: Yes Weakness of Legs: None Weakness of Arms/Hands: None  Permission Sought/Granted                  Emotional Assessment              Admission diagnosis:  Acute bronchitis [J20.9] Acute UTI [N39.0] Acute respiratory  failure with hypoxia ([place]) [J96.01] Acute sepsis ([place]) [A41.9] Acute cough [R05.1] Bronchitis [J40] Patient Active Problem List   Diagnosis Date Noted   Acute bronchitis 04/17/2022   Acute respiratory failure with hypoxia ([place]) 04/17/2022   Bronchitis 04/17/2022   Flu vaccine need 11/02/2021   Diuretic-induced hypokalemia 11/02/2021   Iron deficiency anemia 11/01/2021   Vitamin B12 deficiency anemia due to intrinsic factor deficiency 11/01/2021   Chronic respiratory failure with hypoxia ([place]) 10/17/2021   Stress-induced cardiomyopathy 10/03/2021   Type 2 diabetes mellitus without complications ([place]) [id number]   Need for shingles vaccine 03/22/2021   Need for prophylactic vaccination with combined diphtheria-tetanus-pertussis (DTP) vaccine 12/20/2020   Encounter for general adult medical examination with abnormal findings 12/20/2020   Upper airway cough syndrome 10/28/2020   Spinal stenosis, lumbar region with neurogenic claudication 12/31/2019   DDD (degenerative disc disease), lumbar 08/11/2019   Primary osteoarthritis of right hip 08/11/2019   NASH (nonalcoholic steatohepatitis) 06/02/2019   Colon cancer screening 12/25/2017   Estrogen deficiency 12/25/2017   Pulmonary fibrosis ([place])    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 03/21/2017   Insomnia w/ sleep apnea 03/21/2017   Class 1 obesity with  serious comorbidity and body mass index (BMI) of 34.0 to 34.9 in adult 01/31/2016   History of recurrent UTIs 01/31/2016   Insomnia due to anxiety and fear 10/11/2014   GAD (generalized anxiety disorder) 10/11/2014   Paroxysmal atrial fibrillation ([place]) 10/04/2014   Hyperlipidemia with target LDL less than 100 10/22/2012   Asthma, mild persistent 04/06/2011   UTI (urinary tract infection) 07/28/2009   Hypothyroidism 06/20/2009   Depression with anxiety 06/20/2009   Type II diabetes mellitus with manifestations ([place]) 04/08/2009   Essential hypertension, benign 04/08/2009   OSA on CPAP  04/08/2009   PCP:  [name], MD Pharmacy:   [id number] [place], [place] - [address] AT [address] [place] [address] Phone: [phone] Fax: [phone]     Social Determinants of Health (SDOH) Social History: [place]: No Food Insecurity (04/17/2022)  Housing: Low Risk  (04/17/2022)  Transportation Needs: No Transportation Needs (04/17/2022)  Utilities: Not At Risk (04/17/2022)  Depression (PHQ2-9): Low Risk  (02/27/2022)  Physical Activity: Insufficiently Active (01/31/2017)  Tobacco Use: Low Risk  (04/16/2022)   SDOH Interventions:     Readmission Risk Interventions     No data to display

## 2022-04-17 NOTE — Assessment & Plan Note (Signed)
Patient with history of pulmonary fibrosis, developed acute bronchitis and sinusitis, symptoms more consistent with some viral illness.  Respiratory panel negative for COVID, influenza and RSV.  Checking respiratory viral panel. Procalcitonin negative. -Continue with Solu-Medrol -Continue with Zithromax for its anti-inflammatory effect on lungs -Continue with supportive care

## 2022-04-18 DIAGNOSIS — E039 Hypothyroidism, unspecified: Secondary | ICD-10-CM

## 2022-04-18 DIAGNOSIS — J9601 Acute respiratory failure with hypoxia: Secondary | ICD-10-CM

## 2022-04-18 DIAGNOSIS — N390 Urinary tract infection, site not specified: Secondary | ICD-10-CM

## 2022-04-18 DIAGNOSIS — J8410 Pulmonary fibrosis, unspecified: Secondary | ICD-10-CM

## 2022-04-18 DIAGNOSIS — J209 Acute bronchitis, unspecified: Principal | ICD-10-CM

## 2022-04-18 DIAGNOSIS — E119 Type 2 diabetes mellitus without complications: Secondary | ICD-10-CM

## 2022-04-18 DIAGNOSIS — I10 Essential (primary) hypertension: Secondary | ICD-10-CM

## 2022-04-18 LAB — URINE CULTURE: Culture: >=100000 — AB

## 2022-04-18 LAB — GLUCOSE, CAPILLARY
Glucose-Capillary: 195 mg/dL — ABNORMAL HIGH (ref 70–99)
Glucose-Capillary: 167 mg/dL — ABNORMAL HIGH (ref 70–99)
Glucose-Capillary: 214 mg/dL — ABNORMAL HIGH (ref 70–99)
Glucose-Capillary: 186 mg/dL — ABNORMAL HIGH (ref 70–99)

## 2022-04-18 MED ORDER — BUDESONIDE 0.5 MG/2ML IN SUSP
0.5000 mg | Freq: Two times a day (BID) | RESPIRATORY_TRACT | Status: DC
  Administered 2022-04-18 – 2022-04-19 (×2): 0.5 mg via RESPIRATORY_TRACT
  Filled 2022-04-18 (×3): qty 2

## 2022-04-18 MED ORDER — IPRATROPIUM-ALBUTEROL 0.5-2.5 (3) MG/3ML IN SOLN
3.0000 mL | RESPIRATORY_TRACT | Status: DC
  Administered 2022-04-18 – 2022-04-19 (×4): 3 mL via RESPIRATORY_TRACT
  Filled 2022-04-18 (×5): qty 3

## 2022-04-18 NOTE — Progress Notes (Signed)
Patient has home CPAP at bedside, already on, self manages. CPAP plugged into red outlet, no loose or frayed cords observed, appears to be working properly. Family member at bedside

## 2022-04-18 NOTE — Progress Notes (Signed)
Progress Note   Patient: Kirsten [name] [id number] DOB: 1955/02/25 DOA: 04/16/2022     1 DOS: the patient was seen and examined on 04/18/2022   Brief hospital course: Taken from H&P.  Kirsten [name] is a 67 y.o. female with medical history significant of hypertension, , diabetes mellitus, asthma, GERD, OSA on CPAP, hypothyroidism, pulmonary fibrosis, IBS, atrial fibrillation on Eliquis and amiodarone,  who presents with shortness of breath for 5 days, progressively worsening.  Patient has cough, congestion and nasal drainage with greenish-yellow sputum production.  Also reports low-grade fevers and chills.  No chest pain.  No nausea, vomiting, diarrhea or abdominal pain.  No UTI symptoms.  Patient does not use any oxygen at baseline.  ED course and data review: Tachypneic to 30 with O2 sat 79% on room air requiring 2 L to maintain sats in the high 90s.  VBG with pH 7.32 and pCO2 60.  Troponin 85.  BNP 179.  Respiratory viral panel negative.  WBC slightly elevated at 11,600.  Urinalysis showed moderate leukocyte esterase EKG, independent clean interpreted showing sinus at 96 with nonspecific ST-T wave changes CTA PE negative for PE, showing cardiomegaly and bibasilar atelectasis Patient treated with DuoNebs and methylprednisolone. Due to borderline sepsis criteria she was started on IV fluid bolus, aztreonam and vancomycin and Flagyl.  Sputum culture and blood culture sent.   Patient did not meet sepsis criteria-sepsis ruled out.  Troponin with flat curve.  Preliminary blood cultures negative.  Pending urine cultures.  UA was only positive for leukocyte esterase. Procalcitonin negative.  Checking respiratory viral panel.  3/6: Vital stable, still on 4 L of oxygen.  Respiratory viral panel negative.  CBG elevated as patient is on steroid.  Urine cultures with multiple species suggesting recollection which will be useless as patient is already on antibiotics.  Blood cultures remain  negative.    Assessment and Plan: * Acute respiratory failure with hypoxia ([place]) Patient with new oxygen requirement of up to 4 L of oxygen with no baseline use.  Most likely secondary to acute bronchitis and pulmonary fibrosis exacerbation with some viral illness. -New supplemental oxygen -Wean as tolerated  Acute bronchitis Patient with history of pulmonary fibrosis, developed acute bronchitis and sinusitis, symptoms more consistent with some viral illness.  Respiratory panel negative for COVID, influenza and RSV.  Checking respiratory viral panel. Procalcitonin negative. -Continue with Solu-Medrol -Continue with Zithromax for its anti-inflammatory effect on lungs -Continue with supportive care  Pulmonary fibrosis (HCC) Please see above  Hypothyroidism -Continue home Synthroid  Type 2 diabetes mellitus without complications (HCC) Recent A1c 6.7, well controlled.  Patient taking metformin -Sliding scale insulin  Depression with anxiety -Continue home medications  Paroxysmal atrial fibrillation (HCC) -Continue home amiodarone, metoprolol and Eliquis  Essential hypertension, benign -Continue home metoprolol, losartan and Lasix  UTI (urinary tract infection) UA concerning for UTI.  No significant urinary symptoms except mildly increased urinary frequency.  Urine cultures with multiple species, recommending recollection which will be useless as she is already on antibiotics. -Continue with ceftriaxone-will complete 3-day course   OSA on CPAP -Continue CPAP at night   Subjective: Patient was still feeling short of breath.  Physical Exam: Vitals:   04/18/22 1106 04/18/22 1332 04/18/22 1630 04/18/22 1646  BP: 132/69   123/63  Pulse: 71   72  Resp: 20   20  Temp: 98 F (36.7 C)   98.1 F (36.7 C)  TempSrc: Oral     SpO2: 98% 97% 97%  97%  Weight:      Height:       General.  Well-developed lady, in no acute distress. Pulmonary.  Scant scattered wheeze  bilaterally, normal respiratory effort. CV.  Regular rate and rhythm, no JVD, rub or murmur. Abdomen.  Soft, nontender, nondistended, BS positive. CNS.  Alert and oriented .  No focal neurologic deficit. Extremities.  No edema, no cyanosis, pulses intact and symmetrical. Psychiatry.  Judgment and insight appears normal.    Data Reviewed: Prior data reviewed  Family Communication:   Disposition: Status is: Inpatient Remains inpatient appropriate because: Severity of illness  Planned Discharge Destination: Home  DVT prophylaxis. Eliquis Time spent:  minutes  This record has been created using Systems analyst. Errors have been sought and corrected,but may not always be located. Such creation errors do not reflect on the standard of care.   Author: Lorella Nimrod, MD 04/18/2022 5:05 PM  For on call review [email or web address].

## 2022-04-18 NOTE — Consult Note (Signed)
[facility] Consultation      Date: 04/18/2022,   MRN# [id number] Kirsten [name] 01/05/1956   CHIEF COMPLAINT:  +SOB    HISTORY OF PRESENT ILLNESS   67 y.o. female with medical history significant of hypertension, , diabetes mellitus, asthma, GERD, OSA on CPAP, hypothyroidism,  IBS, atrial fibrillation on Eliquis and amiodarone    who presents with shortness of breath. Patient states that she has shortness of breath for more than 5 days, which has been progressively worsening.    +Patient has cough, congestion and nasal drainage with greenish yellow sputum production.   Patient also reports low grade subjective fever and chills.    Patient stated he has been consistently taking Eliquis, last dose was yesterday.  Patient is not wearing oxygen normally, but was found to have oxygen desaturating to 79% on room air    ER COURSE Tachypnea to 30 with O2 sat 79% on room air requiring 2 L to maintain sats in the high 90s.  VBG with pH 7.32 and pCO2 60.  Troponin 85.  BNP 179.   Respiratory viral panel negative.   WBC slightly elevated at 11,600.   Urinalysis showed moderate leukocyte esterase EKG, independent clean interpreted showing sinus at 96 with nonspecific ST-T wave changes CTA PE negative for PE, showing cardiomegaly and bibasilar atelectasis Patient treated with DuoNebs and methylprednisolone. Due to borderline sepsis criteria she was started on IV fluid bolus, aztreonam and vancomycin and Flagyl.  Sputum culture and blood culture sent.     Currently alert and awake Following commands Still wheezing     PAST MEDICAL HISTORY   Past Medical History:  Diagnosis Date   A-fib ([place])    Anxiety disorder    Asthma    Dyspnea    Gastroschisis    umphalocele, rotated organs abd repair until age 36   GERD (gastroesophageal reflux disease)    HTN (hypertension)    Hyperlipidemia    IBS (irritable bowel syndrome)    Morbid obesity (HCC)    Target wt -  185  for BMI < 30   Obesity    OSA on CPAP    SBO (small bowel obstruction) ([place])    Resolved with NG/Bowel rest around 2009   Sleep apnea    Type II or unspecified type diabetes mellitus without mention of complication, not stated as uncontrolled      SURGICAL HISTORY   Past Surgical History:  Procedure Laterality Date   BREAST EXCISIONAL BIOPSY Left 03/19/2012   neg   CESAREAN SECTION     CHOLECYSTECTOMY  1992   COLONOSCOPY  2011   2011-normal   Newborn Surgery - GI - ORGANS OUTSIDE ABDOMEN     RIGHT/LEFT HEART CATH AND CORONARY ANGIOGRAPHY N/A 10/02/2021   Procedure: RIGHT/LEFT HEART CATH AND CORONARY ANGIOGRAPHY;  Surgeon: Isaias Cowman, MD;  Location: [facility];  Service: Cardiovascular;  Laterality: N/A;   Small Bowel Repair     TUBAL LIGATION  1988     FAMILY HISTORY   Family History  Problem Relation Age of Onset   Lymphoma Mother    Diabetes type II Father    Colon cancer Father    Diabetes Maternal Grandmother    Diabetes type I Son    Goiter Neg Hx    Breast cancer Neg Hx    Ovarian cancer Neg Hx      SOCIAL HISTORY   Social History   Tobacco Use   Smoking status: Never  Smokeless tobacco: Never   Tobacco comments:    "tried as a teen"  Vaping Use   Vaping Use: Never used  Substance Use Topics   Alcohol use: Yes    Alcohol/week: 2.0 standard drinks of alcohol    Types: 2 Glasses of wine per week    Comment: rare   Drug use: No     MEDICATIONS    Home Medication:    Current Medication:  Current Facility-Administered Medications:    acetaminophen (TYLENOL) tablet 650 mg, 650 mg, Oral, Q6H PRN, 650 mg at 04/17/22 0855 **OR** acetaminophen (TYLENOL) suppository 650 mg, 650 mg, Rectal, Q6H PRN, Athena Masse, MD   albuterol (PROVENTIL) (2.5 MG/3ML) 0.083% nebulizer solution 2.5 mg, 2.5 mg, Nebulization, Q2H PRN, Judd Gaudier V, MD, 2.5 mg at 04/18/22 [id number]   amiodarone (PACERONE) tablet 200 mg, 200 mg, Oral, Daily,  Judd Gaudier V, MD, 200 mg at 04/18/22 0912   apixaban (ELIQUIS) tablet 5 mg, 5 mg, Oral, BID, Judd Gaudier V, MD, 5 mg at 04/18/22 0913   atorvastatin (LIPITOR) tablet 40 mg, 40 mg, Oral, Daily, Judd Gaudier V, MD, 40 mg at 04/18/22 0912   azithromycin (ZITHROMAX) 500 mg in sodium chloride 0.9 % 250 mL IVPB, 500 mg, Intravenous, Q24H, Judd Gaudier V, MD, Last Rate: 250 mL/hr at 04/18/22 0510, 500 mg at 04/18/22 0510   cefTRIAXone (ROCEPHIN) 2 g in sodium chloride 0.9 % 100 mL IVPB, 2 g, Intravenous, Q24H, Judd Gaudier V, MD, Last Rate: 200 mL/hr at 04/18/22 0837, 2 g at 04/18/22 0837   fluticasone (FLONASE) 50 MCG/ACT nasal spray 1 spray, 1 spray, Each Nare, Daily, Judd Gaudier V, MD, 1 spray at 04/18/22 [id number]   furosemide (LASIX) tablet 20 mg, 20 mg, Oral, Daily, Judd Gaudier V, MD, 20 mg at 04/18/22 0912   gabapentin (NEURONTIN) capsule 100 mg, 100 mg, Oral, TID, Judd Gaudier V, MD, 100 mg at 04/18/22 0912   guaiFENesin (MUCINEX) 12 hr tablet 600 mg, 600 mg, Oral, BID, Judd Gaudier V, MD, 600 mg at 04/18/22 0912   insulin aspart (novoLOG) injection 0-15 Units, 0-15 Units, Subcutaneous, TID WC, Athena Masse, MD, 3 Units at 04/18/22 [id number]   insulin aspart (novoLOG) injection 0-5 Units, 0-5 Units, Subcutaneous, QHS, Athena Masse, MD   levothyroxine (SYNTHROID) tablet 175 mcg, 175 mcg, Oral, Q0600, Athena Masse, MD, 175 mcg at 04/18/22 0502   losartan (COZAAR) tablet 25 mg, 25 mg, Oral, Daily, Judd Gaudier V, MD, 25 mg at 04/18/22 0912   metoprolol succinate (TOPROL-XL) 24 hr tablet 12.5 mg, 12.5 mg, Oral, Daily, Judd Gaudier V, MD, 12.5 mg at 04/18/22 0912   ondansetron (ZOFRAN) tablet 4 mg, 4 mg, Oral, Q6H PRN **OR** ondansetron (ZOFRAN) injection 4 mg, 4 mg, Intravenous, Q6H PRN, Judd Gaudier V, MD, 4 mg at 04/17/22 2249   PARoxetine (PAXIL-CR) 24 hr tablet 25 mg, 25 mg, Oral, Daily, Judd Gaudier V, MD, 25 mg at 04/18/22 0913   potassium chloride (KLOR-CON M) CR tablet 15 mEq,  15 mEq, Oral, BID, Judd Gaudier V, MD, 15 mEq at 04/18/22 0912   [COMPLETED] methylPREDNISolone sodium succinate (SOLU-MEDROL) 40 mg/mL injection 40 mg, 40 mg, Intravenous, Q12H, 40 mg at 04/17/22 2012 **FOLLOWED BY** predniSONE (DELTASONE) tablet 40 mg, 40 mg, Oral, Q breakfast, Judd Gaudier V, MD, 40 mg at 04/18/22 [id number]    ALLERGIES   Doxycycline, Factive [gemifloxacin mesylate], Crestor [rosuvastatin], Sulfonamide derivatives, Gemifloxacin, and Penicillins     REVIEW OF SYSTEMS  Review of Systems:  Gen:  ill appearing HEENT: Denies blurred vision, double vision, ear pain, eye pain, hearing loss, nose bleeds, sore throat Cardiac:  No dizziness, chest pain or heaviness, chest tightness,edema Resp:   + cough+ sputum porduction, +shortness of breath,+wheezing, -hemoptysis,  Gi: Denies swallowing difficulty, stomach pain, nausea or vomiting, diarrhea, constipation, bowel incontinence Gu:  Denies bladder incontinence, burning urine Ext:   Denies Joint pain, stiffness or swelling Skin: Denies  skin rash, easy bruising or bleeding or hives Endoc:  Denies polyuria, polydipsia , polyphagia or weight change Psych:   Denies depression, insomnia or hallucinations   Other:  All other systems negative   VS: BP 132/69 (BP Location: Right Arm)   Pulse 71   Temp 98 F (36.7 C) (Oral)   Resp 20   Ht '5\' 7"'$  (1.702 m)   Wt 86.2 kg   SpO2 98%   BMI 29.76 kg/m      PHYSICAL EXAM  General Appearance: No distress  EYES PERRLA, EOM intact.   NECK Supple, No JVD Pulmonary: normal breath sounds, +wheezing.  CardiovascularNormal S1,S2.  No m/r/g.   Abdomen: Benign, Soft, non-tender. Skin:   warm, no rashes, no ecchymosis  Extremities: normal, no cyanosis, clubbing. Neuro:without focal findings,  speech normal  PSYCHIATRIC: Mood, affect within normal limits.   ALL OTHER ROS ARE NEGATIVE      IMAGING    CT Angio Chest PE W and/or Wo Contrast  Result Date: 04/17/2022 CLINICAL  DATA:  Pulmonary embolism (PE) suspected, high prob. Shortness of breath EXAM: CT ANGIOGRAPHY CHEST WITH CONTRAST TECHNIQUE: Multidetector CT imaging of the chest was performed using the standard protocol during bolus administration of intravenous contrast. Multiplanar CT image reconstructions and MIPs were obtained to evaluate the vascular anatomy. RADIATION DOSE REDUCTION: This exam was performed according to the departmental dose-optimization program which includes automated exposure control, adjustment of the mA and/or kV according to patient size and/or use of iterative reconstruction technique. CONTRAST:  54m OMNIPAQUE IOHEXOL 350 MG/ML SOLN COMPARISON:  09/29/2021 FINDINGS: Cardiovascular: No filling defects in the pulmonary arteries to suggest pulmonary emboli. Cardiomegaly. Scattered coronary artery and aortic calcifications. Mediastinum/Nodes: No mediastinal, hilar, or axillary adenopathy. Trachea and esophagus are unremarkable. Thyroid unremarkable. Lungs/Pleura: Bibasilar atelectasis.  No effusions. Upper Abdomen: No acute findings. Posterior right diaphragmatic hernia containing fat. Musculoskeletal: Chest wall soft tissues are unremarkable. No acute bony abnormality. Review of the MIP images confirms the above findings. IMPRESSION: No evidence of pulmonary embolus. Cardiomegaly. Bibasilar atelectasis. Aortic Atherosclerosis (ICD10-I70.0). Electronically Signed   By: KRolm BaptiseM.D.   On: 04/17/2022 02:00   DG Chest Port 1 View  Result Date: 04/16/2022 CLINICAL DATA:  Chest pain EXAM: PORTABLE CHEST 1 VIEW COMPARISON:  12/05/2021 FINDINGS: Cardiac shadow is enlarged in size. Lungs are well aerated bilaterally. Mild scarring is noted in the bases bilaterally. No focal infiltrate or effusion is seen. No bony abnormality is noted. IMPRESSION: No active disease. Electronically Signed   By: MInez CatalinaM.D.   On: 04/16/2022 23:42      ASSESSMENT/PLAN    SEVERE ASTHMA/COPD  EXACERBATION -continue  steroids as prescribed -START SCHEDULED NEB THERAPY Continue IV ABX -morphine as needed -wean fio2 as needed and tolerated Follow up cultures   PCCM to follow up as needed   KCorrin Parker M.D.  LVelora HecklerPulmonary & Critical Care Medicine  Medical Director IYardvilleDirector ACarroll County Memorial HospitalCardio-Pulmonary Department

## 2022-04-18 NOTE — Progress Notes (Signed)
Patient saturation drop to 84% room air  with ambulation .

## 2022-04-18 NOTE — Assessment & Plan Note (Signed)
UA concerning for UTI.  No significant urinary symptoms except mildly increased urinary frequency.  Urine cultures with multiple species, recommending recollection which will be useless as she is already on antibiotics. -Continue with ceftriaxone-will complete 3-day course

## 2022-04-19 DIAGNOSIS — I480 Paroxysmal atrial fibrillation: Secondary | ICD-10-CM

## 2022-04-19 DIAGNOSIS — F418 Other specified anxiety disorders: Secondary | ICD-10-CM

## 2022-04-19 DIAGNOSIS — G4733 Obstructive sleep apnea (adult) (pediatric): Secondary | ICD-10-CM

## 2022-04-19 LAB — GLUCOSE, CAPILLARY
Glucose-Capillary: 156 mg/dL — ABNORMAL HIGH (ref 70–99)
Glucose-Capillary: 158 mg/dL — ABNORMAL HIGH (ref 70–99)

## 2022-04-19 MED ORDER — FLUTICASONE PROPIONATE 50 MCG/ACT NA SUSP: 1.0000 | Freq: Every day | NASAL | 2 refills | Status: AC

## 2022-04-19 MED ORDER — PREDNISONE 20 MG PO TABS: 40.0000 mg | ORAL_TABLET | Freq: Every day | ORAL | 0 refills | Status: AC

## 2022-04-19 MED ORDER — TRAMADOL HCL 50 MG PO TABS: 50.0000 mg | ORAL_TABLET | Freq: Two times a day (BID) | ORAL | Status: DC | PRN

## 2022-04-19 MED ORDER — BUDESONIDE-FORMOTEROL FUMARATE 160-4.5 MCG/ACT IN AERO: 2.0000 | INHALATION_SPRAY | Freq: Two times a day (BID) | RESPIRATORY_TRACT | 1 refills | Status: DC

## 2022-04-19 MED ORDER — DICLOFENAC SODIUM 1 % EX GEL: 4.0000 g | Freq: Four times a day (QID) | CUTANEOUS | 1 refills | Status: DC

## 2022-04-19 MED ORDER — ALBUTEROL SULFATE HFA 108 (90 BASE) MCG/ACT IN AERS: 2.0000 | INHALATION_SPRAY | Freq: Four times a day (QID) | RESPIRATORY_TRACT | 2 refills | Status: DC | PRN

## 2022-04-19 MED ORDER — DICLOFENAC SODIUM 1 % EX GEL
4.0000 g | Freq: Four times a day (QID) | CUTANEOUS | Status: DC
  Administered 2022-04-19: 4 g via TOPICAL
  Filled 2022-04-19: qty 100

## 2022-04-19 NOTE — Progress Notes (Signed)
SATURATION QUALIFICATIONS: (This note is used to comply with regulatory documentation for home oxygen)  Patient Saturations on Room Air at Rest = 91%  Patient Saturations on Room Air while Ambulating = 88%  Patient Saturations on 2 Liters of oxygen while Ambulating = 93%  Please briefly explain why patient needs home oxygen:

## 2022-04-19 NOTE — Discharge Summary (Signed)
Physician Discharge Summary   Patient: Kirsten [name] MRN: [id number] DOB: 1956/02/04  Admit date:     04/16/2022  Discharge date: 04/19/22  Discharge Physician: Kirsten [name]   PCP: Kirsten Lima, MD   Recommendations at discharge:  Please obtain CBC and BMP in 1 week Patient is being discharged on home oxygen, please ensure that she needs or discontinue if appropriate Follow-up with pulmonology Follow-up with primary care provider  Discharge Diagnoses: Principal Problem:   Acute respiratory failure with hypoxia [facility]) Active Problems:   Acute bronchitis   Pulmonary fibrosis ([place])   Hypothyroidism   Type 2 diabetes mellitus without complications (Kirsten [name])   Depression with anxiety   Paroxysmal atrial fibrillation ([place])   Essential hypertension, benign   Acute UTI   OSA on CPAP   Hospital Course: Taken from H&P.  Kirsten [name] is a 67 y.o. female with medical history significant of hypertension, , diabetes mellitus, asthma, GERD, OSA on CPAP, hypothyroidism, pulmonary fibrosis, IBS, atrial fibrillation on Eliquis and amiodarone,  who presents with shortness of breath for 5 days, progressively worsening.  Patient has cough, congestion and nasal drainage with greenish-yellow sputum production.  Also reports low-grade fevers and chills.  No chest pain.  No nausea, vomiting, diarrhea or abdominal pain.  No UTI symptoms.  Patient does not use any oxygen at baseline.  ED course and data review: Tachypneic to 30 with O2 sat 79% on room air requiring 2 L to maintain sats in the high 90s.  VBG with pH 7.32 and pCO2 60.  Troponin 85.  BNP 179.  Respiratory viral panel negative.  WBC slightly elevated at 11,600.  Urinalysis showed moderate leukocyte esterase EKG, independent clean interpreted showing sinus at 96 with nonspecific ST-T wave changes CTA PE negative for PE, showing cardiomegaly and bibasilar atelectasis Patient treated with DuoNebs and methylprednisolone. Due to borderline  sepsis criteria she was started on IV fluid bolus, aztreonam and vancomycin and Flagyl.  Sputum culture and blood culture sent.   Patient did not meet sepsis criteria-sepsis ruled out.  Troponin with flat curve.  Preliminary blood cultures negative.  Pending urine cultures.  UA was only positive for leukocyte esterase. Procalcitonin negative.  Checking respiratory viral panel.  3/6: Vital stable, still on 4 L of oxygen.  Respiratory viral panel negative.  CBG elevated as patient is on steroid.  Urine cultures with multiple species suggesting recollection which will be useless as patient is already on antibiotics.  Blood cultures remain negative.  3/7: Vitals stable, saturating 99% on 2 L of oxygen.  Patient was ambulated and continue to require 2 L of oxygen with ambulation.  She was desaturating in high 80s with ambulation.  Patient was provided with home oxygen and her pulmonologist can determine whether she need to continue or not.  She was also started on Symbicort and Flonase. Patient completed 3-day course of ceftriaxone for concern of UTI. She also received 3 days of Zithromax.  Being discharged on 2 more days of prednisone.  She will continue the rest of her home medications and need to have a close follow-up with her providers for further recommendations.    Assessment and Plan: * Acute respiratory failure with hypoxia ([place]) Patient with new oxygen requirement of up to 4 L of oxygen with no baseline use.  Most likely secondary to acute bronchitis and pulmonary fibrosis exacerbation with some viral illness. -New supplemental oxygen -Wean as tolerated  Acute bronchitis Patient with history of pulmonary fibrosis, developed acute  bronchitis and sinusitis, symptoms more consistent with some viral illness.  Respiratory panel negative for COVID, influenza and RSV.  Checking respiratory viral panel. Procalcitonin negative. -Continue with Solu-Medrol -Continue with Zithromax for its  anti-inflammatory effect on lungs -Continue with supportive care  Pulmonary fibrosis (HCC) Please see above  Hypothyroidism -Continue home Synthroid  Type 2 diabetes mellitus without complications (HCC) Recent A1c 6.7, well controlled.  Patient taking metformin -Sliding scale insulin  Depression with anxiety -Continue home medications  Paroxysmal atrial fibrillation (HCC) -Continue home amiodarone, metoprolol and Eliquis  Essential hypertension, benign -Continue home metoprolol, losartan and Lasix  Acute UTI UA concerning for UTI.  No significant urinary symptoms except mildly increased urinary frequency.  Urine cultures with multiple species, recommending recollection which will be useless as she is already on antibiotics. -Continue with ceftriaxone-will complete 3-day course   OSA on CPAP -Continue CPAP at night   Consultants: Pulmonology Procedures performed: None Disposition: Home Diet recommendation:  Discharge Diet Orders (From admission, onward)     Start     Ordered   04/19/22 0000  Diet - low sodium heart healthy        04/19/22 1206           Cardiac and Carb modified diet DISCHARGE MEDICATION: Allergies as of 04/19/2022       Reactions   Doxycycline Rash   Factive [gemifloxacin Mesylate] Rash   Crestor [rosuvastatin]    GI upset   Sulfonamide Derivatives    REACTION: rash   Gemifloxacin Rash   Penicillins Hives, Rash   Has patient had a PCN reaction causing immediate rash, facial/tongue/throat swelling, SOB or lightheadedness with hypotension: No Has patient had a PCN reaction causing severe rash involving mucus membranes or skin necrosis: No Has patient had a PCN reaction that required hospitalization No Has patient had a PCN reaction occurring within the last 10 years: No If all of the above answers are "NO", then may proceed with Cephalosporin use.        Medication List     TAKE these medications    albuterol 108 (90 Base) MCG/ACT  inhaler Commonly known as: VENTOLIN HFA Inhale 2 puffs into the lungs every 6 (six) hours as needed for wheezing or shortness of breath.   amiodarone 200 MG tablet Commonly known as: PACERONE Take 1 tablet (200 mg total) by mouth daily.   apixaban 5 MG Tabs tablet Commonly known as: Eliquis Take 1 tablet (5 mg total) by mouth 2 (two) times daily.   atorvastatin 40 MG tablet Commonly known as: LIPITOR TAKE ONE TABLET BY MOUTH ONE TIME DAILY   budesonide-formoterol 160-4.5 MCG/ACT inhaler Commonly known as: Symbicort Inhale 2 puffs into the lungs 2 (two) times daily.   cyanocobalamin 1000 MCG/ML injection Commonly known as: VITAMIN B12 INJECT 1ML INTO THE MUSCLE ONCE A MONTH   diclofenac Sodium 1 % Gel Commonly known as: VOLTAREN Apply 4 g topically 4 (four) times daily.   fluticasone 50 MCG/ACT nasal spray Commonly known as: FLONASE Place 1 spray into both nostrils daily. Start taking on: April 20, 2022   FreeStyle Libre 3 Sensor Misc PLACE ONE SENSOR ON THE BACK OF YOUR UPPER ARM EVERY 14 DAYS   gabapentin 100 MG capsule Commonly known as: NEURONTIN Take 100 mg by mouth 3 times a day. May add 100 mg every 3 to 5 days until taking 200 mg 3 times daily.   guaiFENesin AC 100-10 MG/5ML syrup Generic drug: guaiFENesin-codeine Take 5 mLs by mouth every  8 (eight) hours as needed for cough.   Lasix 20 MG tablet Generic drug: furosemide Take 20 mg by mouth daily.   levothyroxine 175 MCG tablet Commonly known as: SYNTHROID TAKE ONE TABLET BY MOUTH ONE TIME DAILY   losartan 25 MG tablet Commonly known as: COZAAR Take 1 tablet (25 mg total) by mouth daily.   metFORMIN 500 MG 24 hr tablet Commonly known as: GLUCOPHAGE-XR Take 500 mg by mouth 2 (two) times daily with a meal.   metoprolol succinate 25 MG 24 hr tablet Commonly known as: TOPROL-XL Take 0.5 tablets (12.5 mg total) by mouth daily.   PARoxetine 25 MG 24 hr tablet Commonly known as: PAXIL-CR TAKE 1  TABLET(25 MG) BY MOUTH DAILY   potassium chloride SA 15 MEQ tablet Commonly known as: Klor-Con M15 Take 1 tablet (15 mEq total) by mouth 2 (two) times daily.   predniSONE 20 MG tablet Commonly known as: DELTASONE Take 2 tablets (40 mg total) by mouth daily with breakfast for 2 days. Start taking on: April 20, 2022 What changed:  how much to take additional instructions               Durable Medical Equipment  (From admission, onward)           Start     Ordered   04/19/22 1059  For home use only DME oxygen  Once       Question Answer Comment  Length of Need Lifetime   Mode or (Route) Nasal cannula   Liters per Minute 2   Frequency Continuous (stationary and portable oxygen unit needed)   Oxygen conserving device Yes   Oxygen delivery system Gas      04/19/22 1058            Follow-up Information     Kirsten Lima, MD. Schedule an appointment as soon as possible for a visit in 1 week(s).   Specialty: Internal Medicine Contact information: [place] [address] [phone]                Discharge Exam: Danley Danker Weights   04/16/22 2316 04/19/22 0519  Weight: 86.2 kg 94.3 kg   General.     In no acute distress. Pulmonary.  Scattered dry scratchy crackles bilaterally, normal respiratory effort. CV.  Regular rate and rhythm, no JVD, rub or murmur. Abdomen.  Soft, nontender, nondistended, BS positive. CNS.  Alert and oriented .  No focal neurologic deficit. Extremities.  No edema, no cyanosis, pulses intact and symmetrical. Psychiatry.  Judgment and insight appears normal.   Condition at discharge: stable  The results of significant diagnostics from this hospitalization (including imaging, microbiology, ancillary and laboratory) are listed below for reference.   Imaging Studies: CT Angio Chest PE W and/or Wo Contrast  Result Date: 04/17/2022 CLINICAL DATA:  Pulmonary embolism (PE) suspected, high prob. Shortness of breath  EXAM: CT ANGIOGRAPHY CHEST WITH CONTRAST TECHNIQUE: Multidetector CT imaging of the chest was performed using the standard protocol during bolus administration of intravenous contrast. Multiplanar CT image reconstructions and MIPs were obtained to evaluate the vascular anatomy. RADIATION DOSE REDUCTION: This exam was performed according to the departmental dose-optimization program which includes automated exposure control, adjustment of the mA and/or kV according to patient size and/or use of iterative reconstruction technique. CONTRAST:  58m OMNIPAQUE IOHEXOL 350 MG/ML SOLN COMPARISON:  09/29/2021 FINDINGS: Cardiovascular: No filling defects in the pulmonary arteries to suggest pulmonary emboli. Cardiomegaly. Scattered coronary artery and aortic calcifications.  Mediastinum/Nodes: No mediastinal, hilar, or axillary adenopathy. Trachea and esophagus are unremarkable. Thyroid unremarkable. Lungs/Pleura: Bibasilar atelectasis.  No effusions. Upper Abdomen: No acute findings. Posterior right diaphragmatic hernia containing fat. Musculoskeletal: Chest wall soft tissues are unremarkable. No acute bony abnormality. Review of the MIP images confirms the above findings. IMPRESSION: No evidence of pulmonary embolus. Cardiomegaly. Bibasilar atelectasis. Aortic Atherosclerosis (ICD10-I70.0). Electronically Signed   By: Rolm Baptise M.D.   On: 04/17/2022 02:00   DG Chest Port 1 View  Result Date: 04/16/2022 CLINICAL DATA:  Chest pain EXAM: PORTABLE CHEST 1 VIEW COMPARISON:  12/05/2021 FINDINGS: Cardiac shadow is enlarged in size. Lungs are well aerated bilaterally. Mild scarring is noted in the bases bilaterally. No focal infiltrate or effusion is seen. No bony abnormality is noted. IMPRESSION: No active disease. Electronically Signed   By: Inez Catalina M.D.   On: 04/16/2022 23:42    Microbiology: Results for orders placed or performed during the hospital encounter of 04/16/22  Resp panel by RT-PCR (RSV, Flu A&B, Covid)  Anterior Nasal Swab     Status: None   Collection Time: 04/16/22 11:48 PM   Specimen: Anterior Nasal Swab  Result Value Ref Range Status   SARS Coronavirus 2 by RT PCR NEGATIVE NEGATIVE Final    Comment: (NOTE) SARS-CoV-2 target nucleic acids are NOT DETECTED.  The SARS-CoV-2 RNA is generally detectable in upper respiratory specimens during the acute phase of infection. The lowest concentration of SARS-CoV-2 viral copies this assay can detect is 138 copies/mL. A negative result does not preclude SARS-Cov-2 infection and should not be used as the sole basis for treatment or other patient management decisions. A negative result may occur with  improper specimen collection/handling, submission of specimen other than nasopharyngeal swab, presence of viral mutation(s) within the areas targeted by this assay, and inadequate number of viral copies(<138 copies/mL). A negative result must be combined with clinical observations, patient history, and epidemiological information. The expected result is Negative.  Fact Sheet for Patients:  EntrepreneurPulse.com.au  Fact Sheet for Healthcare Providers:  IncredibleEmployment.be  This test is no t yet approved or cleared by the [place] FDA and  has been authorized for detection and/or diagnosis of SARS-CoV-2 by FDA under an Emergency Use Authorization (EUA). This EUA will remain  in effect (meaning this test can be used) for the duration of the COVID-19 declaration under Section 564(b)(1) of the Act, 21 U.S.C.section 360bbb-3(b)(1), unless the authorization is terminated  or revoked sooner.       Influenza A by PCR NEGATIVE NEGATIVE Final   Influenza B by PCR NEGATIVE NEGATIVE Final    Comment: (NOTE) The Xpert Xpress SARS-CoV-2/FLU/RSV plus assay is intended as an aid in the diagnosis of influenza from Nasopharyngeal swab specimens and should not be used as a sole basis for treatment. Nasal washings  and aspirates are unacceptable for Xpert Xpress SARS-CoV-2/FLU/RSV testing.  Fact Sheet for Patients: EntrepreneurPulse.com.au  Fact Sheet for Healthcare Providers: IncredibleEmployment.be  This test is not yet approved or cleared by the [place] FDA and has been authorized for detection and/or diagnosis of SARS-CoV-2 by FDA under an Emergency Use Authorization (EUA). This EUA will remain in effect (meaning this test can be used) for the duration of the COVID-19 declaration under Section 564(b)(1) of the Act, 21 U.S.C. section 360bbb-3(b)(1), unless the authorization is terminated or revoked.     Resp Syncytial Virus by PCR NEGATIVE NEGATIVE Final    Comment: (NOTE) Fact Sheet for Patients: EntrepreneurPulse.com.au  Fact Sheet  for Healthcare Providers: IncredibleEmployment.be  This test is not yet approved or cleared by the [place] and has been authorized for detection and/or diagnosis of SARS-CoV-2 by FDA under an Emergency Use Authorization (EUA). This EUA will remain in effect (meaning this test can be used) for the duration of the COVID-19 declaration under Section 564(b)(1) of the Act, 21 U.S.C. section 360bbb-3(b)(1), unless the authorization is terminated or revoked.  Performed at [facility], [address] [place], [place] [address]   Blood Culture (routine x 2)     Status: None (Preliminary result)   Collection Time: 04/17/22 12:16 AM   Specimen: BLOOD  Result Value Ref Range Status   Specimen Description BLOOD RIGHT FOREARM  Final   Special Requests   Final    BOTTLES DRAWN AEROBIC ONLY Blood Culture adequate volume   Culture   Final    NO GROWTH 2 DAYS Performed at [facility], [address], [place], [place] [address]    Report Status PENDING  Incomplete  Blood Culture (routine x 2)     Status: None (Preliminary result)   Collection Time:  04/17/22 12:16 AM   Specimen: BLOOD  Result Value Ref Range Status   Specimen Description BLOOD LEFT HAND  Final   Special Requests   Final    BOTTLES DRAWN AEROBIC ONLY Blood Culture adequate volume   Culture   Final    NO GROWTH 2 DAYS Performed at [facility], [address] [place], [place] [address]    Report Status PENDING  Incomplete  Urine Culture (for pregnant, neutropenic or urologic patients or patients with an indwelling urinary catheter)     Status: Abnormal   Collection Time: 04/17/22  1:36 AM   Specimen: Urine, Clean Catch  Result Value Ref Range Status   Specimen Description   Final    URINE, CLEAN CATCH Performed at [facility], [address], [place], [place] [address]    Special Requests   Final    NONE Performed at [facility], [address] [place],  [address]    Culture (A)  Final    >=100,000 COLONIES/mL MULTIPLE SPECIES PRESENT, SUGGEST RECOLLECTION   Report Status 04/18/2022 FINAL  Final  Respiratory (~20 pathogens) panel by PCR     Status: None   Collection Time: 04/17/22 10:24 AM   Specimen: Nasopharyngeal Swab; Respiratory  Result Value Ref Range Status   Adenovirus NOT DETECTED NOT DETECTED Final   Coronavirus 229E NOT DETECTED NOT DETECTED Final    Comment: (NOTE) The Coronavirus on the Respiratory Panel, DOES NOT test for the novel  Coronavirus (2019 nCoV)    Coronavirus HKU1 NOT DETECTED NOT DETECTED Final   Coronavirus NL63 NOT DETECTED NOT DETECTED Final   Coronavirus OC43 NOT DETECTED NOT DETECTED Final   Metapneumovirus NOT DETECTED NOT DETECTED Final   Rhinovirus / Enterovirus NOT DETECTED NOT DETECTED Final   Influenza A NOT DETECTED NOT DETECTED Final   Influenza B NOT DETECTED NOT DETECTED Final   Parainfluenza Virus 1 NOT DETECTED NOT DETECTED Final   Parainfluenza Virus 2 NOT DETECTED NOT DETECTED Final   Parainfluenza Virus 3 NOT DETECTED NOT DETECTED Final   Parainfluenza Virus 4 NOT  DETECTED NOT DETECTED Final   Respiratory Syncytial Virus NOT DETECTED NOT DETECTED Final   Bordetella pertussis NOT DETECTED NOT DETECTED Final   Bordetella Parapertussis NOT DETECTED NOT DETECTED Final   Chlamydophila pneumoniae NOT DETECTED NOT DETECTED Final   Mycoplasma  pneumoniae NOT DETECTED NOT DETECTED Final    Comment: Performed at [facility], [place] [address] [place], [place] [address]    Labs: CBC: Recent Labs  Lab 04/16/22 2333  WBC 11.6*  NEUTROABS 8.5*  HGB 11.5*  HCT 38.6  MCV 92.6  PLT 123456   Basic Metabolic Panel: Recent Labs  Lab 04/16/22 2333  NA 140  K 3.8  CL 101  CO2 29  GLUCOSE 191*  BUN 17  CREATININE 0.98  CALCIUM 9.2   Liver Function Tests: Recent Labs  Lab 04/16/22 2333  AST 47*  ALT 40  ALKPHOS 86  BILITOT 0.3  PROT 6.9  ALBUMIN 3.7   CBG: Recent Labs  Lab 04/18/22 0816 04/18/22 1114 04/18/22 1647 04/18/22 1957 04/19/22 0829  GLUCAP 195* 167* 214* 186* 156*    Discharge time spent: greater than 30 minutes.  This record has been created using Systems analyst. Errors have been sought and corrected,but may not always be located. Such creation errors do not reflect on the standard of care.   Signed: Lorella Nimrod, MD [facility] 04/19/2022

## 2022-04-19 NOTE — Progress Notes (Signed)
SATURATION QUALIFICATIONS: (This note is used to comply with regulatory documentation for home oxygen)   Patient Saturations on Room Air at Rest = 91%   Patient Saturations on Room Air while Ambulating = 88%   Patient Saturations on 2 Liters of oxygen while Ambulating = 93%  Patient with history of pulmonary fibrosis with new oxygen requirement. Patient will need home oxygen to be used 2 L all the time, especially during ambulation and need to have a close follow-up with her pulmonologist for further recommendations.

## 2022-04-19 NOTE — TOC Initial Note (Signed)
Transition of Care [facility]) - Initial/Assessment Note    Patient Details  [name] MRN: [id number] Date of Birth: 1955/11/28  Transition of Care [facility]) CM/SW Contact:    Laurena Slimmer, RN Phone Number: 04/19/2022, 10:46 AM  Clinical Narrative:                 Spoke with patient regarding home oxygen. She stated she did not want to use [employer] for home oxgen needs. She was not please with services on last admission. Patient was advised of other DME agencies that could services her. She did not have a choice other than not wanting [employer]. She was advised if order is written for home oxygen if will be delivered her to her room and the remaining supplies will be arranged in her home.         Patient Goals and CMS Choice            Expected Discharge Plan and Services                                              Prior Living Arrangements/Services                       Activities of Daily Living Home Assistive Devices/Equipment: Eyeglasses, Other (Comment) (pulse ox) ADL Screening (condition at time of admission) Patient's cognitive ability adequate to safely complete daily activities?: Yes Is the patient deaf or have difficulty hearing?: No Does the patient have difficulty seeing, even when wearing glasses/contacts?: No Does the patient have difficulty concentrating, remembering, or making decisions?: No Patient able to express need for assistance with ADLs?: Yes Does the patient have difficulty dressing or bathing?: No Independently performs ADLs?: Yes (appropriate for developmental age) Does the patient have difficulty walking or climbing stairs?: Yes Weakness of Legs: None Weakness of Arms/Hands: None  Permission Sought/Granted                  Emotional Assessment              Admission diagnosis:  Acute bronchitis [J20.9] Acute UTI [N39.0] Acute respiratory failure with hypoxia ([place]) [J96.01] Acute sepsis ([place]) [A41.9] Acute  cough [R05.1] Bronchitis [J40] Patient Active Problem List   Diagnosis Date Noted   Acute bronchitis 04/17/2022   Acute respiratory failure with hypoxia ([place]) 04/17/2022   Bronchitis 04/17/2022   Flu vaccine need 11/02/2021   Diuretic-induced hypokalemia 11/02/2021   Iron deficiency anemia 11/01/2021   Vitamin B12 deficiency anemia due to intrinsic factor deficiency 11/01/2021   Chronic respiratory failure with hypoxia ([place]) 10/17/2021   Stress-induced cardiomyopathy 10/03/2021   Type 2 diabetes mellitus without complications ([place]) [id number]   Need for shingles vaccine 03/22/2021   Need for prophylactic vaccination with combined diphtheria-tetanus-pertussis (DTP) vaccine 12/20/2020   Encounter for general adult medical examination with abnormal findings 12/20/2020   Upper airway cough syndrome 10/28/2020   Spinal stenosis, lumbar region with neurogenic claudication 12/31/2019   DDD (degenerative disc disease), lumbar 08/11/2019   Primary osteoarthritis of right hip 08/11/2019   NASH (nonalcoholic steatohepatitis) 06/02/2019   Colon cancer screening 12/25/2017   Estrogen deficiency 12/25/2017   Pulmonary fibrosis ([place])    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 03/21/2017   Insomnia w/ sleep apnea 03/21/2017   Class 1 obesity with serious comorbidity and body mass index (  BMI) of 34.0 to 34.9 in adult 01/31/2016   History of recurrent UTIs 01/31/2016   Insomnia due to anxiety and fear 10/11/2014   GAD (generalized anxiety disorder) 10/11/2014   Paroxysmal atrial fibrillation ([place]) 10/04/2014   Hyperlipidemia with target LDL less than 100 10/22/2012   Asthma, mild persistent 04/06/2011   UTI (urinary tract infection) 07/28/2009   Hypothyroidism 06/20/2009   Depression with anxiety 06/20/2009   Type II diabetes mellitus with manifestations ([place]) 04/08/2009   Essential hypertension, benign 04/08/2009   OSA on CPAP 04/08/2009   PCP:  [name], MD Pharmacy:   [id number]  [place], [place] - [address] AT [address] [place] [address] Phone: [phone] Fax: [phone]     Social Determinants of Health (SDOH) Social History: [place]: No Food Insecurity (04/17/2022)  Housing: Low Risk  (04/17/2022)  Transportation Needs: No Transportation Needs (04/17/2022)  Utilities: Not At Risk (04/17/2022)  Depression (PHQ2-9): Low Risk  (02/27/2022)  Physical Activity: Insufficiently Active (01/31/2017)  Tobacco Use: Low Risk  (04/16/2022)   SDOH Interventions:     Readmission Risk Interventions     No data to display

## 2022-04-19 NOTE — TOC Progression Note (Signed)
Transition of Care [facility]) - Progression Note    Patient Details  [name] MRN: [id number] Date of Birth: 1955-03-22  Transition of Care [facility]) CM/SW Contact  Laurena Slimmer, RN Phone Number: 04/19/2022, 11:13 AM  Clinical Narrative:    Request for home oxygen sent to [facility] from [place] Per [place] oxygen saturations require MD signature. MD and nurse notified.         Expected Discharge Plan and Services                                               Social Determinants of Health (SDOH) Interventions SDOH Screenings   Food Insecurity: No Food Insecurity (04/17/2022)  Housing: Low Risk  (04/17/2022)  Transportation Needs: No Transportation Needs (04/17/2022)  Utilities: Not At Risk (04/17/2022)  Depression (PHQ2-9): Low Risk  (02/27/2022)  Physical Activity: Insufficiently Active (01/31/2017)  Tobacco Use: Low Risk  (04/16/2022)    Readmission Risk Interventions     No data to display

## 2022-04-20 ENCOUNTER — Encounter: Payer: Self-pay | Admitting: *Deleted

## 2022-04-20 ENCOUNTER — Telehealth: Payer: Self-pay | Admitting: *Deleted

## 2022-04-20 NOTE — Transitions of Care (Post Inpatient/ED Visit) (Signed)
   04/20/2022  [name] MRN: [id number] DOB: 1955/06/11  Today's TOC FU Call Status: Today's TOC FU Call Status:: Unsuccessul Call (1st Attempt) Unsuccessful Call (1st Attempt) Date: 04/20/22  Attempted to reach the patient regarding the most recent Inpatient visit; left voice message requesting call back  Follow Up Plan: Additional outreach attempts will be made to reach the patient to complete the Transitions of Care (Post Inpatient visit) call.   Oneta Rack, RN, BSN, CCRN Alumnus RN CM Care Coordination/ Transition of [place] Management [phone]: direct office

## 2022-04-20 NOTE — Transitions of Care (Post Inpatient/ED Visit) (Signed)
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  I called the complaint department and they have not called me back yet"                                                                       . Patient Questions/Concerns Addressed: Other: (verified that patient now has home O2 and is using as prescribed; verified patient has correct/ direct phone number for Patient Experience Department)  Items Reviewed: Did you receive and understand the discharge instructions provided?: Yes (thoroughly reviewed with patient/ spouse who verbalizes excellent understanding of same) Medications obtained and verified?: Yes (Medications Reviewed) (full medication review completed; no concerns or discrepancies identified- awaiting FU on symbicort; confirmed  obtained/ is taking all newly Rx'd medications as instructed; self-manages medications and denies questions/ concerns around medications today) Any new allergies since your discharge?: No Dietary orders reviewed?: Yes Type of Diet Ordered:: "healthy, low salt" Do you have support at home?: Yes People in Home: spouse Name of Support/Comfort Primary Source: patient is essentially independent in self care activities;  husband assists as needed  Home Care and Equipment/Supplies: [place] Ordered?: No Any new equipment or medical supplies ordered?: Yes (home O2) Name of Medical supply agency?: [employer] Were you able to get the equipment/medical supplies?: Yes Do you have any questions related to the use of the equipment/supplies?: No  Functional Questionnaire: Do you need assistance with bathing/showering or dressing?: No ("occasionally") Do you need assistance with meal preparation?: No Do you need assistance with eating?: No Do you have difficulty maintaining continence: No Do you need assistance with getting out of bed/getting out of a chair/moving?: No Do you have difficulty managing or taking your medications?: No  Folllow up appointments reviewed: PCP Follow-up appointment confirmed?: NA (spouse reports he will contact PCP office on Monday to schedule since it is end of Day on Friday when he returned my call from earlier this morning) [facility] Follow-up appointment confirmed?: Yes Date of Specialist follow-up appointment?: 04/26/22 Follow-Up Specialty Provider:: pulmonary provider Do you need transportation to your follow-up appointment?: No Do you understand care options if your condition(s) worsen?: Yes-patient verbalized understanding  SDOH Interventions Today    Flowsheet Row Most Recent Value  SDOH Interventions   Food Insecurity Interventions Intervention Not Indicated  Transportation Interventions Intervention Not Indicated  [husband provides transportation]      TOC Interventions Today    Flowsheet Row Most Recent Value  TOC Interventions   TOC Interventions Discussed/Reviewed TOC Interventions Discussed  [declined further/ ongoing care coordination follow  up,  provided my direct contact information should questions/ concerns/ needs arise post-TOC call]      Interventions Today    Flowsheet Row Most Recent Value  Chronic Disease   Chronic disease  during today's visit Chronic Obstructive Pulmonary Disease (COPD)  General Interventions   General Interventions Discussed/Reviewed General Interventions Discussed, Doctor Visits, Referral to Nurse, Durable Medical Equipment (DME)  Doctor Visits Discussed/Reviewed Doctor Visits Discussed, Specialist, PCP  Durable Medical Equipment (DME) Oxygen  PCP/Specialist Visits Compliance with follow-up visit  Education Interventions   Education Provided Provided Education  Provided Verbal Education On Other, Medication  [safe use of home O2,  difference between rescue and maintenance inhalers]  Nutrition Interventions   Nutrition Discussed/Reviewed Nutrition Discussed  Pharmacy Interventions   Pharmacy Dicussed/Reviewed Pharmacy Topics Discussed, Affording Medications  [full medication review,  process to take to obtain alternate inhaler- confirmed they are working with outpatient pharmacy,  encouraged them to call me back if they decide to explore otions for patient assistance program for simbicort]      Oneta Rack, RN, BSN, CCRN Alumnus RN CM Care Coordination/ Transition of [place] Management [phone]: direct office

## 2022-04-22 LAB — CULTURE, BLOOD (ROUTINE X 2)
Culture: NO GROWTH
Culture: NO GROWTH
Special Requests: ADEQUATE
Special Requests: ADEQUATE

## 2022-04-23 ENCOUNTER — Other Ambulatory Visit: Payer: Self-pay

## 2022-04-23 DIAGNOSIS — I480 Paroxysmal atrial fibrillation: Secondary | ICD-10-CM

## 2022-04-23 MED ORDER — AMIODARONE HCL 200 MG PO TABS: 200.0000 mg | ORAL_TABLET | Freq: Every day | ORAL | 0 refills | Status: DC

## 2022-04-24 ENCOUNTER — Ambulatory Visit: Payer: PPO | Admitting: Adult Health

## 2022-04-26 ENCOUNTER — Encounter: Payer: Self-pay | Admitting: Adult Health

## 2022-04-26 ENCOUNTER — Ambulatory Visit (INDEPENDENT_AMBULATORY_CARE_PROVIDER_SITE_OTHER): Payer: PPO | Admitting: Adult Health

## 2022-04-26 VITALS — BP 106/64 | HR 83 | Temp 98.0°F | Ht 67.0 in | Wt 197.2 lb

## 2022-04-26 DIAGNOSIS — J209 Acute bronchitis, unspecified: Secondary | ICD-10-CM

## 2022-04-26 DIAGNOSIS — G4733 Obstructive sleep apnea (adult) (pediatric): Secondary | ICD-10-CM | POA: Diagnosis not present

## 2022-04-26 DIAGNOSIS — J8410 Pulmonary fibrosis, unspecified: Secondary | ICD-10-CM

## 2022-04-26 DIAGNOSIS — J9611 Chronic respiratory failure with hypoxia: Secondary | ICD-10-CM | POA: Diagnosis not present

## 2022-04-26 MED ORDER — FLUTICASONE FUROATE-VILANTEROL 100-25 MCG/ACT IN AEPB: 1.0000 | INHALATION_SPRAY | Freq: Every day | RESPIRATORY_TRACT | 5 refills | Status: DC

## 2022-04-26 NOTE — Progress Notes (Signed)
@Patient  ID: , female    DOB: 13-Nov-1955, 67 y.o.   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  Initial echo showed EF at 35 to 45%.  Treated for pneumonia Severe H1N1 infection 2011 with critical illness -pneumonia , prolonged hospitalization    TEST/EVENTS :  PFT 09/20/09 >> FEV1 1.55 (57%), FEV1% 83, TLC 3.00 (54%), DLCO 58% RAST 11/02/15 >> negative IgE 10/28/20 >> 3   Sleep Tests:  PSG 07/22/08 >> AHI 80.4, SpO2 low 82% CPAP 02/22/21 to 02/27/21 >> used on 6 of 6 nights with average 7 hrs 3 min.  Average AHI 0.7 with CPAP 8 cm H2O   Cardiac Tests:  Echo 12/12/20 >> EF greater than 55%, mild LVH, grade 2 DD   CT chest 04/2017 widespread linear scarring bilaterally most severe in right lung unchanged from previous scans questionable postinflammatory/postinfectious  04/26/2022 Follow up : Post hospital follow up , Asthma , O2 RF  Patient presents for a posthospital follow-up.  Patient was admitted last week with an asthmatic bronchitic exacerbation with acute respiratory failure.  Patient says she had a really bad cold that progressively worsened to the point where she was very short of breath.  Was noted to be hypoxic and started on oxygen.  She was treated with IV antibiotics and nebulized bronchodilators.  Along with IV steroids.  Patient says she has finished her antibiotics and prednisone and is feeling better.  Cough and congestion are decreased.  She still has low energy.  She  remains on oxygen 2 L.  She was recommended started on Symbicort.  Unfortunately insurance does not cover. She denies any hemoptysis, chest pain, orthopnea, edema.     Allergies  Allergen Reactions   Doxycycline Rash   Factive [Gemifloxacin Mesylate] Rash   Crestor [Rosuvastatin]     GI upset   Sulfonamide Derivatives     REACTION: rash   Gemifloxacin Rash   Penicillins Hives and Rash    Has patient had a PCN reaction causing immediate rash, facial/tongue/throat swelling, SOB or lightheadedness with hypotension: No Has patient had a PCN reaction causing severe rash involving mucus membranes or skin necrosis: No Has patient had a PCN reaction that required hospitalization No Has patient had a PCN reaction occurring within the last 10 years: No If all of the above answers are "NO", then may proceed with Cephalosporin use.     Immunization History  Administered Date(s) Administered   Influenza Split 11/29/2010, 11/23/2011   Influenza Whole 11/12/2009   Influenza,inj,Quad PF,6+ Mos 10/22/2012, 12/31/2013, 11/02/2014, 11/28/2015, 11/23/2016, 12/25/2017, 11/03/2018, 11/16/2019, 12/20/2020, 11/01/2021   PFIZER(Purple Top)SARS-COV-2 Vaccination 04/25/2019, 05/20/2019, 02/08/2020   PNEUMOCOCCAL CONJUGATE-20 12/20/2020   Pneumococcal Polysaccharide-23 11/23/2011, 11/16/2019   Td 11/01/2021   Tdap 08/02/2010    Past Medical History:  Diagnosis Date   A-fib (HCC)    Anxiety disorder    Asthma    Dyspnea    Gastroschisis    umphalocele, rotated organs abd repair until age 89   GERD (gastroesophageal reflux disease)    HTN (hypertension)    Hyperlipidemia  IBS (irritable bowel syndrome)    Morbid obesity (HCC)    Target wt - 185  for BMI < 30   Obesity    OSA on CPAP    SBO (small bowel obstruction) ([place])    Resolved with NG/Bowel rest around 2009   Sleep apnea    Type II or unspecified type diabetes mellitus without mention of complication, not stated as uncontrolled      Tobacco History: Social History   Tobacco Use  Smoking Status Never  Smokeless Tobacco Never  Tobacco Comments   "tried as a teen"   Counseling given: Not Answered Tobacco comments: "tried as a teen"   Outpatient Medications Prior to Visit  Medication Sig Dispense Refill   albuterol (VENTOLIN HFA) 108 (90 Base) MCG/ACT inhaler Inhale 2 puffs into the lungs every 6 (six) hours as needed for wheezing or shortness of breath. 8 g 2   amiodarone (PACERONE) 200 MG tablet Take 1 tablet (200 mg total) by mouth daily. 90 tablet 0   apixaban (ELIQUIS) 5 MG TABS tablet Take 1 tablet (5 mg total) by mouth 2 (two) times daily. 180 tablet 1   atorvastatin (LIPITOR) 40 MG tablet TAKE ONE TABLET BY MOUTH ONE TIME DAILY 90 tablet 1   Continuous Blood Gluc Sensor (FREESTYLE LIBRE 3 SENSOR) MISC PLACE ONE SENSOR ON THE BACK OF YOUR UPPER ARM EVERY 14 DAYS 1 each 5   cyanocobalamin (,VITAMIN B-12,) 1000 MCG/ML injection INJECT 1ML INTO THE MUSCLE ONCE A MONTH 10 mL 0   diclofenac Sodium (VOLTAREN) 1 % GEL Apply 4 g topically 4 (four) times daily. 350 g 1   fluticasone (FLONASE) 50 MCG/ACT nasal spray Place 1 spray into both nostrils daily. 18 mL 2   furosemide (LASIX) 20 MG tablet Take 20 mg by mouth daily.     gabapentin (NEURONTIN) 100 MG capsule Take 100 mg by mouth 3 times a day. May add 100 mg every 3 to 5 days until taking 200 mg 3 times daily.     guaiFENesin-codeine (GUAIFENESIN AC) 100-10 MG/5ML syrup Take 5 mLs by mouth every 8 (eight) hours as needed for cough.     levothyroxine (SYNTHROID) 175 MCG tablet TAKE ONE TABLET BY MOUTH ONE TIME DAILY 90 tablet 1   metFORMIN (GLUCOPHAGE-XR) 500 MG 24 hr tablet Take 500 mg by mouth 2 (two) times daily with a meal.     PARoxetine (PAXIL-CR) 25 MG 24 hr tablet TAKE 1 TABLET(25 MG) BY MOUTH DAILY 90 tablet 1   potassium chloride SA (KLOR-CON M15) 15 MEQ tablet Take 1 tablet (15 mEq total) by mouth 2 (two) times daily. 180 tablet 0    budesonide-formoterol (SYMBICORT) 160-4.5 MCG/ACT inhaler Inhale 2 puffs into the lungs 2 (two) times daily. (Patient not taking: Reported on 04/20/2022) 10.2 g 1   losartan (COZAAR) 25 MG tablet Take 1 tablet (25 mg total) by mouth daily. 30 tablet 0   metoprolol succinate (TOPROL-XL) 25 MG 24 hr tablet Take 0.5 tablets (12.5 mg total) by mouth daily. 15 tablet 0   No facility-administered medications prior to visit.     Review of Systems:   Constitutional:   No  weight loss, night sweats,  Fevers, chills,  +fatigue, or  lassitude.  HEENT:   No headaches,  Difficulty swallowing,  Tooth/dental problems, or  Sore throat,                No sneezing, itching, ear ache, nasal congestion, post nasal drip,  CV:  No chest pain,  Orthopnea, PND, swelling in lower extremities, anasarca, dizziness, palpitations, syncope.   GI  No heartburn, indigestion, abdominal pain, nausea, vomiting, diarrhea, change in bowel habits, loss of appetite, bloody stools.   Resp: No chest wall deformity  Skin: no rash or lesions.  GU: no dysuria, change in color of urine, no urgency or frequency.  No flank pain, no hematuria   MS:  No joint pain or swelling.  No decreased range of motion.  No back pain.    Physical Exam  BP 106/64 (BP Location: Left Arm, Patient Position: Sitting, Cuff Size: Large)   Pulse 83   Temp 98 F (36.7 C) (Oral)   Ht 5\' 7"  (1.702 m)   Wt 197 lb 3.2 oz (89.4 kg)   SpO2 93%   BMI 30.89 kg/m   GEN: A/Ox3; pleasant , NAD, well nourished , on O2    HEENT:  [place]/AT,   NOSE-clear, THROAT-clear, no lesions, no postnasal drip or exudate noted.   NECK:  Supple w/ fair ROM; no JVD; normal carotid impulses w/o bruits; no thyromegaly or nodules palpated; no lymphadenopathy.    RESP  Clear  P & A; w/o, wheezes/ rales/ or rhonchi. no accessory muscle use, no dullness to percussion  CARD:  RRR, no m/r/g, no peripheral edema, pulses intact, no cyanosis or clubbing.  GI:   Soft & nt; nml  bowel sounds; no organomegaly or masses detected.   Musco: Warm bil, no deformities or joint swelling noted.   Neuro: alert, no focal deficits noted.    Skin: Warm, no lesions or rashes    Lab Results:  CBC       Imaging: CT Angio Chest PE W and/or Wo Contrast  Result Date: 04/17/2022 CLINICAL DATA:  Pulmonary embolism (PE) suspected, high prob. Shortness of breath EXAM: CT ANGIOGRAPHY CHEST WITH CONTRAST TECHNIQUE: Multidetector CT imaging of the chest was performed using the standard protocol during bolus administration of intravenous contrast. Multiplanar CT image reconstructions and MIPs were obtained to evaluate the vascular anatomy. RADIATION DOSE REDUCTION: This exam was performed according to the departmental dose-optimization program which includes automated exposure control, adjustment of the mA and/or kV according to patient size and/or use of iterative reconstruction technique. CONTRAST:  85mL OMNIPAQUE IOHEXOL 350 MG/ML SOLN COMPARISON:  09/29/2021 FINDINGS: Cardiovascular: No filling defects in the pulmonary arteries to suggest pulmonary emboli. Cardiomegaly. Scattered coronary artery and aortic calcifications. Mediastinum/Nodes: No mediastinal, hilar, or axillary adenopathy. Trachea and esophagus are unremarkable. Thyroid unremarkable. Lungs/Pleura: Bibasilar atelectasis.  No effusions. Upper Abdomen: No acute findings. Posterior right diaphragmatic hernia containing fat. Musculoskeletal: Chest wall soft tissues are unremarkable. No acute bony abnormality. Review of the MIP images confirms the above findings. IMPRESSION: No evidence of pulmonary embolus. Cardiomegaly. Bibasilar atelectasis. Aortic Atherosclerosis (ICD10-I70.0). Electronically Signed   By: Rolm Baptise M.D.   On: 04/17/2022 02:00   DG Chest Port 1 View  Result Date: 04/16/2022 CLINICAL DATA:  Chest pain EXAM: PORTABLE CHEST 1 VIEW COMPARISON:  12/05/2021 FINDINGS: Cardiac shadow is enlarged in size. Lungs are  well aerated bilaterally. Mild scarring is noted in the bases bilaterally. No focal infiltrate or effusion is seen. No bony abnormality is noted. IMPRESSION: No active disease. Electronically Signed   By: Inez Catalina M.D.   On: 04/16/2022 23:42          No data to display          No results found for: "NITRICOXIDE"  Assessment & Plan:   No problem-specific Assessment & Plan notes found for this encounter.     Rexene Edison, NP 04/26/2022

## 2022-04-26 NOTE — Patient Instructions (Addendum)
Begin BREO 1 puff daily , rinse after use.  Albuterol inhaler As needed   Continue on Oxygen with activity and At bedtime   Continue on CPAP At bedtime   Follow up in 4-6 weeks with PFT in [place] with Dr. Mortimer Fries or Brightyn Mozer NP  Please contact office for sooner follow up if symptoms do not improve or worsen or seek emergency care

## 2022-04-27 NOTE — Assessment & Plan Note (Signed)
Patient had H1 N1 in 2011 with a prolonged hospitalization.  Has had some chronic changes noted on x-ray and CT scan.  Will check PFTs on return.  Most likely set up for high-resolution CT chest going forward to evaluate extent may have a component of postinflammatory/infectious fibrosis.  Plan  Patient Instructions  Begin BREO 1 puff daily , rinse after use.  Albuterol inhaler As needed   Continue on Oxygen with activity and At bedtime   Continue on CPAP At bedtime   Follow up in 4-6 weeks with PFT in [place] with Dr. Mortimer Fries or Chinedum Vanhouten NP  Please contact office for sooner follow up if symptoms do not improve or worsen or seek emergency care

## 2022-04-27 NOTE — Assessment & Plan Note (Signed)
Continue on CPAP at bedtime 

## 2022-04-27 NOTE — Assessment & Plan Note (Signed)
Recent acute asthmatic bronchitic exacerbation.  No improvement after antibiotics and steroids.  Check PFTs on return visit.  Insurance would not cover Symbicort.  Changed to [facility].  Plan  Patient Instructions  Begin BREO 1 puff daily , rinse after use.  Albuterol inhaler As needed   Continue on Oxygen with activity and At bedtime   Continue on CPAP At bedtime   Follow up in 4-6 weeks with PFT in [place] with Dr. Mortimer Fries or Jourdyn Hasler NP  Please contact office for sooner follow up if symptoms do not improve or worsen or seek emergency care

## 2022-04-27 NOTE — Assessment & Plan Note (Signed)
Patient is to continue on oxygen to maintain O2 saturations greater than 88 to 9%.  Will check oxygen bands on return visit.

## 2022-04-30 ENCOUNTER — Ambulatory Visit: Payer: PPO | Admitting: Internal Medicine

## 2022-05-01 DIAGNOSIS — R0602 Shortness of breath: Secondary | ICD-10-CM | POA: Diagnosis not present

## 2022-05-15 ENCOUNTER — Other Ambulatory Visit: Payer: Self-pay

## 2022-05-15 DIAGNOSIS — I480 Paroxysmal atrial fibrillation: Secondary | ICD-10-CM

## 2022-05-15 DIAGNOSIS — M461 Sacroiliitis, not elsewhere classified: Secondary | ICD-10-CM | POA: Diagnosis not present

## 2022-05-16 DIAGNOSIS — G4733 Obstructive sleep apnea (adult) (pediatric): Secondary | ICD-10-CM | POA: Diagnosis not present

## 2022-05-21 ENCOUNTER — Other Ambulatory Visit: Payer: Self-pay

## 2022-05-21 DIAGNOSIS — I480 Paroxysmal atrial fibrillation: Secondary | ICD-10-CM

## 2022-06-01 DIAGNOSIS — R0602 Shortness of breath: Secondary | ICD-10-CM | POA: Diagnosis not present

## 2022-06-10 ENCOUNTER — Other Ambulatory Visit: Payer: Self-pay | Admitting: Internal Medicine

## 2022-06-10 DIAGNOSIS — E118 Type 2 diabetes mellitus with unspecified complications: Secondary | ICD-10-CM

## 2022-06-12 DIAGNOSIS — I480 Paroxysmal atrial fibrillation: Secondary | ICD-10-CM | POA: Diagnosis not present

## 2022-06-12 DIAGNOSIS — I5181 Takotsubo syndrome: Secondary | ICD-10-CM | POA: Diagnosis not present

## 2022-06-12 DIAGNOSIS — E119 Type 2 diabetes mellitus without complications: Secondary | ICD-10-CM | POA: Diagnosis not present

## 2022-06-19 ENCOUNTER — Telehealth: Payer: Self-pay | Admitting: Internal Medicine

## 2022-06-19 DIAGNOSIS — J9611 Chronic respiratory failure with hypoxia: Secondary | ICD-10-CM

## 2022-06-19 NOTE — Telephone Encounter (Signed)
I have left a message for the patient to return my call.  

## 2022-06-19 NOTE — Telephone Encounter (Signed)
Pt is asking to have O2 picked up feels she doesn't need it anymore

## 2022-06-19 NOTE — Telephone Encounter (Signed)
Called patient to schedule Medicare Annual Wellness Visit (AWV). Left message for patient to call back and schedule Medicare Annual Wellness Visit (AWV).   AWV-I: 06/19/2022  Please schedule an appointment at any time with [facility].  If any questions, please contact me at [phone].  Thank you ,  Bernice Cicero Care Guide [facility] AWV TEAM Direct Dial: [phone]    

## 2022-06-20 ENCOUNTER — Telehealth: Payer: Self-pay

## 2022-06-20 DIAGNOSIS — J9611 Chronic respiratory failure with hypoxia: Secondary | ICD-10-CM

## 2022-06-20 DIAGNOSIS — H26493 Other secondary cataract, bilateral: Secondary | ICD-10-CM | POA: Diagnosis not present

## 2022-06-20 DIAGNOSIS — J4521 Mild intermittent asthma with (acute) exacerbation: Secondary | ICD-10-CM

## 2022-06-20 NOTE — Telephone Encounter (Signed)
Ok to put in an order for PFT before her follow up on 07/12/2022?

## 2022-06-20 NOTE — Telephone Encounter (Signed)
-----   Message from Lilian Kapur sent at 06/19/2022  2:48 PM EDT ----- Regarding: Need PFT order Per Tammy Parrett's AVS on 04/16/22  Follow up in 4-6 weeks with PFT in [place] with Dr. Belia Heman or Parrett NP   No PFT order was placed  Thanks Synetta Fail

## 2022-06-20 NOTE — Telephone Encounter (Signed)
Called and spoke to patient. She is requesting an order to be placed to [employer] to d/c nocturnal oxygen.   Dr. Belia Heman, please advise. Thanks

## 2022-06-20 NOTE — Telephone Encounter (Signed)
Lm x2 for patient. Will call once more due to nature of call.   

## 2022-06-21 DIAGNOSIS — G4733 Obstructive sleep apnea (adult) (pediatric): Secondary | ICD-10-CM | POA: Diagnosis not present

## 2022-06-21 NOTE — Telephone Encounter (Signed)
Order has been placed. Nothing further needed. 

## 2022-06-21 NOTE — Telephone Encounter (Signed)
Yes fine for PFT order

## 2022-06-24 ENCOUNTER — Other Ambulatory Visit: Payer: Self-pay | Admitting: Internal Medicine

## 2022-06-24 DIAGNOSIS — E118 Type 2 diabetes mellitus with unspecified complications: Secondary | ICD-10-CM

## 2022-06-25 NOTE — Telephone Encounter (Signed)
Dr. Kasa, please advise. Thanks °

## 2022-06-26 ENCOUNTER — Encounter: Payer: Self-pay | Admitting: Internal Medicine

## 2022-06-26 ENCOUNTER — Ambulatory Visit (INDEPENDENT_AMBULATORY_CARE_PROVIDER_SITE_OTHER): Payer: PPO | Admitting: Internal Medicine

## 2022-06-26 VITALS — BP 134/78 | HR 76 | Temp 98.4°F | Resp 16 | Ht 67.0 in | Wt 205.0 lb

## 2022-06-26 DIAGNOSIS — R300 Dysuria: Secondary | ICD-10-CM | POA: Diagnosis not present

## 2022-06-26 DIAGNOSIS — E119 Type 2 diabetes mellitus without complications: Secondary | ICD-10-CM

## 2022-06-26 DIAGNOSIS — I10 Essential (primary) hypertension: Secondary | ICD-10-CM

## 2022-06-26 DIAGNOSIS — F418 Other specified anxiety disorders: Secondary | ICD-10-CM

## 2022-06-26 DIAGNOSIS — F411 Generalized anxiety disorder: Secondary | ICD-10-CM | POA: Diagnosis not present

## 2022-06-26 DIAGNOSIS — I480 Paroxysmal atrial fibrillation: Secondary | ICD-10-CM

## 2022-06-26 DIAGNOSIS — E039 Hypothyroidism, unspecified: Secondary | ICD-10-CM | POA: Diagnosis not present

## 2022-06-26 DIAGNOSIS — D510 Vitamin B12 deficiency anemia due to intrinsic factor deficiency: Secondary | ICD-10-CM

## 2022-06-26 DIAGNOSIS — E785 Hyperlipidemia, unspecified: Secondary | ICD-10-CM

## 2022-06-26 LAB — URINALYSIS, ROUTINE W REFLEX MICROSCOPIC
Bilirubin Urine: NEGATIVE
Hgb urine dipstick: NEGATIVE
Ketones, ur: NEGATIVE
Nitrite: NEGATIVE
RBC / HPF: NONE SEEN (ref 0–?)
Specific Gravity, Urine: 1.02 (ref 1.000–1.030)
Total Protein, Urine: NEGATIVE
Urine Glucose: NEGATIVE
Urobilinogen, UA: 0.2 (ref 0.0–1.0)
pH: 6 (ref 5.0–8.0)

## 2022-06-26 LAB — CBC WITH DIFFERENTIAL/PLATELET
Basophils Absolute: 0 10*3/uL (ref 0.0–0.1)
Basophils Relative: 0.5 % (ref 0.0–3.0)
Eosinophils Absolute: 0.1 10*3/uL (ref 0.0–0.7)
Eosinophils Relative: 1.1 % (ref 0.0–5.0)
HCT: 38.8 % (ref 36.0–46.0)
Hemoglobin: 12.7 g/dL (ref 12.0–15.0)
Lymphocytes Relative: 39.4 % (ref 12.0–46.0)
Lymphs Abs: 3.5 10*3/uL (ref 0.7–4.0)
MCHC: 32.8 g/dL (ref 30.0–36.0)
MCV: 87.3 fl (ref 78.0–100.0)
Monocytes Absolute: 0.6 10*3/uL (ref 0.1–1.0)
Monocytes Relative: 6.5 % (ref 3.0–12.0)
Neutro Abs: 4.7 10*3/uL (ref 1.4–7.7)
Neutrophils Relative %: 52.5 % (ref 43.0–77.0)
Platelets: 265 10*3/uL (ref 150.0–400.0)
RBC: 4.45 Mil/uL (ref 3.87–5.11)
RDW: 16.5 % — ABNORMAL HIGH (ref 11.5–15.5)
WBC: 9 10*3/uL (ref 4.0–10.5)

## 2022-06-26 LAB — BASIC METABOLIC PANEL
BUN: 15 mg/dL (ref 6–23)
CO2: 32 mEq/L (ref 19–32)
Calcium: 10.1 mg/dL (ref 8.4–10.5)
Chloride: 103 mEq/L (ref 96–112)
Creatinine, Ser: 0.83 mg/dL (ref 0.40–1.20)
GFR: 74.25 mL/min (ref >60.00)
Glucose, Bld: 103 mg/dL — ABNORMAL HIGH (ref 70–99)
Potassium: 4.3 mEq/L (ref 3.5–5.1)
Sodium: 142 mEq/L (ref 135–145)

## 2022-06-26 LAB — HEMOGLOBIN A1C: Hgb A1c MFr Bld: 7.3 % — ABNORMAL HIGH (ref 4.6–6.5)

## 2022-06-26 MED ORDER — APIXABAN 5 MG PO TABS: 5.0000 mg | ORAL_TABLET | Freq: Two times a day (BID) | ORAL | 1 refills | Status: DC

## 2022-06-26 MED ORDER — PAROXETINE HCL ER 25 MG PO TB24: ORAL_TABLET | ORAL | 1 refills | Status: DC

## 2022-06-26 MED ORDER — ALPRAZOLAM 0.25 MG PO TABS
0.2500 mg | ORAL_TABLET | Freq: Three times a day (TID) | ORAL | 0 refills | Status: AC | PRN
Start: 2022-06-26 — End: ?

## 2022-06-26 MED ORDER — LEVOTHYROXINE SODIUM 175 MCG PO TABS: 175.0000 ug | ORAL_TABLET | Freq: Every day | ORAL | 0 refills | Status: DC

## 2022-06-26 MED ORDER — METFORMIN HCL ER 500 MG PO TB24: 500.0000 mg | ORAL_TABLET | Freq: Two times a day (BID) | ORAL | 0 refills | Status: DC

## 2022-06-26 MED ORDER — ATORVASTATIN CALCIUM 40 MG PO TABS: ORAL_TABLET | ORAL | 1 refills | Status: DC

## 2022-06-26 NOTE — Patient Instructions (Signed)

## 2022-06-26 NOTE — Telephone Encounter (Signed)
Order placed to [facility] to d/c nocturnal oxygen.  Patient is aware and voiced her understanding.  Nothing further needed.

## 2022-06-26 NOTE — Progress Notes (Signed)
Subjective:  Patient ID: Kirsten [name], female    DOB: [birth year]  Age: 67 y.o. MRN: [id number]  CC: Anemia, Hypothyroidism, Diabetes, Atrial Fibrillation, and Hypertension   HPI Kirsten [name] presents for f/up -----  She complains of anxiety and requests Xanax.  Her blood sugar has been up-and-down but no symptomatic hyper or hypoglycemia.  She is active and denies chest pain, shortness of breath, diaphoresis, or edema.  Outpatient Medications Prior to Visit  Medication Sig Dispense Refill   albuterol (VENTOLIN HFA) 108 (90 Base) MCG/ACT inhaler Inhale 2 puffs into the lungs every 6 (six) hours as needed for wheezing or shortness of breath. 8 g 2   amiodarone (PACERONE) 200 MG tablet Take 1 tablet (200 mg total) by mouth daily. 90 tablet 0   Continuous Glucose Sensor (FREESTYLE LIBRE 3 SENSOR) MISC PLACE ONE SENSOR TO THE BACK OF YOUR UPPER ARM. REPLACE EVERY 14 DAYS. 2 each 5   cyanocobalamin (,VITAMIN B-12,) 1000 MCG/ML injection INJECT INTO THE MUSCLE ONCE A MONTH 10 mL 0   diclofenac Sodium (VOLTAREN) 1 % GEL Apply 4 g topically 4 (four) times daily. 350 g 1   fluticasone (FLONASE) 50 MCG/ACT nasal spray Place 1 spray into both nostrils daily. 18 mL 2   fluticasone furoate-vilanterol (BREO ELLIPTA) 100-25 MCG/ACT AEPB Inhale 1 puff into the lungs daily. 30 each 5   furosemide (LASIX) 20 MG tablet Take 20 mg by mouth daily.     gabapentin (NEURONTIN) 100 MG capsule Take 100 mg by mouth 3 times a day. May add 100 mg every 3 to 5 days until taking 200 mg 3 times daily.     potassium chloride SA (KLOR-CON M15) 15 MEQ tablet Take 1 tablet (15 mEq total) by mouth 2 (two) times daily. 180 tablet 0   apixaban (ELIQUIS) 5 MG TABS tablet Take 1 tablet (5 mg total) by mouth 2 (two) times daily. 180 tablet 1   atorvastatin (LIPITOR) 40 MG tablet TAKE ONE TABLET BY MOUTH ONE TIME DAILY 90 tablet 1   guaiFENesin-codeine (GUAIFENESIN AC) 100-10 MG/5ML syrup Take 5 mLs by mouth every 8  (eight) hours as needed for cough.     levothyroxine (SYNTHROID) 175 MCG tablet TAKE ONE TABLET BY MOUTH ONE TIME DAILY 90 tablet 1   metFORMIN (GLUCOPHAGE-XR) 500 MG 24 hr tablet Take 500 mg by mouth 2 (two) times daily with a meal.     PARoxetine (PAXIL-CR) 25 MG 24 hr tablet TAKE 1 TABLET(25 MG) BY MOUTH DAILY 90 tablet 1   losartan (COZAAR) 25 MG tablet Take 1 tablet (25 mg total) by mouth daily. 30 tablet 0   metoprolol succinate (TOPROL-XL) 25 MG 24 hr tablet Take 0.5 tablets (12.5 mg total) by mouth daily. 15 tablet 0   No facility-administered medications prior to visit.    ROS Review of Systems  Constitutional:  Positive for unexpected weight change (wt gain). Negative for chills, diaphoresis and fatigue.  HENT: Negative.    Eyes: Negative.   Respiratory: Negative.  Negative for cough, chest tightness, shortness of breath and wheezing.   Cardiovascular:  Negative for chest pain, palpitations and leg swelling.  Gastrointestinal:  Negative for abdominal pain, constipation, nausea and vomiting.  Endocrine: Positive for polyuria.  Genitourinary:  Positive for dysuria, frequency and urgency. Negative for hematuria and pelvic pain.  Musculoskeletal: Negative.  Negative for arthralgias, joint swelling and myalgias.  Skin: Negative.   Neurological:  Negative for dizziness, weakness, light-headedness and headaches.  Hematological:  Negative for adenopathy. Does not bruise/bleed easily.  Psychiatric/Behavioral: Negative.      Objective:  BP 134/78 (BP Location: Left Arm, Patient Position: Sitting, Cuff Size: Large)   Pulse 76   Temp 98.4 F (36.9 C) (Oral)   Resp 16   Ht 5\' 7"  (1.702 m)   Wt 205 lb (93 kg)   SpO2 98%   BMI 32.11 kg/m   BP Readings from Last 3 Encounters:  06/26/22 134/78  04/26/22 106/64  04/19/22 (!) 128/47    Wt Readings from Last 3 Encounters:  06/26/22 205 lb (93 kg)  04/26/22 197 lb 3.2 oz (89.4 kg)  04/19/22 207 lb 14.3 oz (94.3 kg)    Physical  Exam Vitals reviewed.  Constitutional:      Appearance: Normal appearance.  HENT:     Mouth/Throat:     Mouth: Mucous membranes are moist.  Eyes:     General: No scleral icterus.    Conjunctiva/sclera: Conjunctivae normal.  Cardiovascular:     Rate and Rhythm: Normal rate and regular rhythm.     Heart sounds: No murmur heard. Pulmonary:     Effort: Pulmonary effort is normal.     Breath sounds: No stridor. No wheezing, rhonchi or rales.  Abdominal:     General: Abdomen is flat.     Palpations: There is no mass.     Tenderness: There is no abdominal tenderness. There is no guarding or rebound.     Hernia: No hernia is present.  Musculoskeletal:        General: Normal range of motion.     Cervical back: Neck supple.     Right lower leg: No edema.     Left lower leg: No edema.  Lymphadenopathy:     Cervical: No cervical adenopathy.  Skin:    General: Skin is warm and dry.  Neurological:     General: No focal deficit present.     Mental Status: She is alert.  Psychiatric:        Mood and Affect: Mood normal.        Behavior: Behavior normal.     Lab Results  Component Value Date   WBC 9.0 06/26/2022   HGB 12.7 06/26/2022   HCT 38.8 06/26/2022   PLT 265.0 06/26/2022   GLUCOSE 103 (H) 06/26/2022   CHOL 83 09/30/2021   TRIG 70 09/30/2021   HDL 37 (L) 09/30/2021   LDLDIRECT 63.0 01/15/2019   LDLCALC 32 09/30/2021   ALT 40 04/16/2022   AST 47 (H) 04/16/2022   NA 142 06/26/2022   K 4.3 06/26/2022   CL 103 06/26/2022   CREATININE 0.83 06/26/2022   BUN 15 06/26/2022   CO2 32 06/26/2022   TSH 1.40 02/27/2022   INR 1.3 (H) 04/16/2022   HGBA1C 7.3 (H) 06/26/2022   MICROALBUR <0.7 02/27/2022    CT Angio Chest PE W and/or Wo Contrast  Result Date: 04/17/2022 CLINICAL DATA:  Pulmonary embolism (PE) suspected, high prob. Shortness of breath EXAM: CT ANGIOGRAPHY CHEST WITH CONTRAST TECHNIQUE: Multidetector CT imaging of the chest was performed using the standard protocol  during bolus administration of intravenous contrast. Multiplanar CT image reconstructions and MIPs were obtained to evaluate the vascular anatomy. RADIATION DOSE REDUCTION: This exam was performed according to the departmental dose-optimization program which includes automated exposure control, adjustment of the mA and/or kV according to patient size and/or use of iterative reconstruction technique. CONTRAST:  75mL OMNIPAQUE IOHEXOL 350 MG/ML SOLN COMPARISON:  09/29/2021  FINDINGS: Cardiovascular: No filling defects in the pulmonary arteries to suggest pulmonary emboli. Cardiomegaly. Scattered coronary artery and aortic calcifications. Mediastinum/Nodes: No mediastinal, hilar, or axillary adenopathy. Trachea and esophagus are unremarkable. Thyroid unremarkable. Lungs/Pleura: Bibasilar atelectasis.  No effusions. Upper Abdomen: No acute findings. Posterior right diaphragmatic hernia containing fat. Musculoskeletal: Chest wall soft tissues are unremarkable. No acute bony abnormality. Review of the MIP images confirms the above findings. IMPRESSION: No evidence of pulmonary embolus. Cardiomegaly. Bibasilar atelectasis. Aortic Atherosclerosis (ICD10-I70.0). Electronically Signed   By: Charlett Nose M.D.   On: 04/17/2022 02:00   DG Chest Port 1 View  Result Date: 04/16/2022 CLINICAL DATA:  Chest pain EXAM: PORTABLE CHEST 1 VIEW COMPARISON:  12/05/2021 FINDINGS: Cardiac shadow is enlarged in size. Lungs are well aerated bilaterally. Mild scarring is noted in the bases bilaterally. No focal infiltrate or effusion is seen. No bony abnormality is noted. IMPRESSION: No active disease. Electronically Signed   By: Alcide Clever M.D.   On: 04/16/2022 23:42    Assessment & Plan:   Essential hypertension, benign- Her blood pressure is well-controlled. -     Basic metabolic panel; Future -     Urinalysis, Routine w reflex microscopic; Future  Hypothyroidism, unspecified type- She is euthyroid. -     Levothyroxine Sodium;  Take 1 tablet (175 mcg total) by mouth daily.  Dispense: 90 tablet; Refill: 0  Paroxysmal atrial fibrillation (HCC)- She has good rate and rhythm control.  Will continue the DOAC. -     Apixaban; Take 1 tablet (5 mg total) by mouth 2 (two) times daily.  Dispense: 180 tablet; Refill: 1  Type 2 diabetes mellitus without complication, without long-term current use of insulin (HCC)- Her blood sugar is well-controlled. -     metFORMIN HCl ER; Take 1 tablet (500 mg total) by mouth 2 (two) times daily with a meal.  Dispense: 180 tablet; Refill: 0 -     Hemoglobin A1c; Future  Vitamin B12 deficiency anemia due to intrinsic factor deficiency -     CBC with Differential/Platelet; Future  Depression with anxiety -     PARoxetine HCl ER; TAKE 1 TABLET(25 MG) BY MOUTH DAILY  Dispense: 90 tablet; Refill: 1  Hyperlipidemia with target LDL less than 100 -     Atorvastatin Calcium; TAKE ONE TABLET BY MOUTH ONE TIME DAILY  Dispense: 90 tablet; Refill: 1  GAD (generalized anxiety disorder) -     ALPRAZolam; Take 1 tablet (0.25 mg total) by mouth 3 (three) times daily as needed for anxiety.  Dispense: 270 tablet; Refill: 0  Dysuria- Urine culture is negative. -     CULTURE, URINE COMPREHENSIVE; Future -     Urinalysis, Routine w reflex microscopic; Future     Follow-up: Return in about 4 months (around 10/27/2022).  Sanda Linger, MD

## 2022-06-28 LAB — CULTURE, URINE COMPREHENSIVE

## 2022-07-03 ENCOUNTER — Ambulatory Visit: Payer: PPO | Attending: Adult Health

## 2022-07-12 ENCOUNTER — Ambulatory Visit (INDEPENDENT_AMBULATORY_CARE_PROVIDER_SITE_OTHER): Payer: PPO | Admitting: Adult Health

## 2022-07-12 ENCOUNTER — Encounter: Payer: Self-pay | Admitting: Adult Health

## 2022-07-12 VITALS — BP 126/80 | HR 74 | Temp 97.8°F | Ht 67.0 in | Wt 207.6 lb

## 2022-07-12 DIAGNOSIS — G4733 Obstructive sleep apnea (adult) (pediatric): Secondary | ICD-10-CM

## 2022-07-12 DIAGNOSIS — J4540 Moderate persistent asthma, uncomplicated: Secondary | ICD-10-CM | POA: Diagnosis not present

## 2022-07-12 DIAGNOSIS — J45909 Unspecified asthma, uncomplicated: Secondary | ICD-10-CM | POA: Insufficient documentation

## 2022-07-12 DIAGNOSIS — R0609 Other forms of dyspnea: Secondary | ICD-10-CM

## 2022-07-12 DIAGNOSIS — J9611 Chronic respiratory failure with hypoxia: Secondary | ICD-10-CM

## 2022-07-12 NOTE — Assessment & Plan Note (Signed)
Excellent control compliance on CPAP.  With perceived benefit.  No changes  Plan  Patient Instructions  Continue on BREO 1 puff daily , rinse after use.  Albuterol inhaler As needed   Continue on CPAP At bedtime   Follow up in 3 months with PFT in [place] with Dr. Belia Heman and As needed

## 2022-07-12 NOTE — Patient Instructions (Addendum)
Continue on BREO 1 puff daily , rinse after use.  Albuterol inhaler As needed   Continue on CPAP At bedtime   Follow up in 3 months with PFT in [place] with Dr. Belia Heman and As needed

## 2022-07-12 NOTE — Assessment & Plan Note (Signed)
Discharged on oxygen after admission and March for severe asthmatic bronchitic exacerbation.  Now resolved.  Oxygen has been returned back to homecare company O2 saturations today in the office are adequate at 97% on room air

## 2022-07-12 NOTE — Assessment & Plan Note (Signed)
Moderate persistent asthma.  Recent hospitalization in March 2024 after viral illness.  Patient has fully recovered.  Is back to baseline.  PFTs are pending.  CT chest and chest x-ray showed no acute process.  Recommend continue on ICS/LABA combo.  If continues to have flareups would increase maintenance therapy.  Also consider adding an albuterol nebs if needed. Asthma action plan discussed.  Continue with trigger prevention Plan  Patient Instructions  Continue on BREO 1 puff daily , rinse after use.  Albuterol inhaler As needed   Continue on CPAP At bedtime   Follow up in 3 months with PFT in [place] with Dr. Belia Heman and As needed

## 2022-07-12 NOTE — Progress Notes (Signed)
@Patient  ID: Kirsten [name], female    DOB: 1956/01/10, 67 y.o.   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  Initial echo showed EF at 35 to 45%.  Treated for pneumonia Severe H1N1 infection 2011 with critical illness -pneumonia , prolonged hospitalization    TEST/EVENTS :  PFT 09/20/09 >> FEV1 1.55 (57%), FEV1% 83, TLC 3.00 (54%), DLCO 58% RAST 11/02/15 >> negative IgE 10/28/20 >> 3   Sleep Tests:  PSG 07/22/08 >> AHI 80.4, SpO2 low 82% CPAP 02/22/21 to 02/27/21 >> used on 6 of 6 nights with average 7 hrs 3 min.  Average AHI 0.7 with CPAP 8 cm H2O   Cardiac Tests:  Echo 12/12/20 >> EF greater than 55%, mild LVH, grade 2 DD  2D echo September 30, 2021 showed EF at 55 to 60%, right ventricular systolic function normal, RV size is normal   CT chest 04/2017 widespread linear scarring bilaterally most severe in right lung unchanged from previous scans questionable postinflammatory/postinfectious  CT chest April 17, 2022 showed bibasilar atelectasis, negative PE  07/12/2022 Follow up : OSA, Asthma and O2 RF  Patient presents for 34-month follow-up.  Patient was seen last visit for posthospital follow-up after admission in March for severe asthmatic bronchitic exacerbation with associated acute respiratory failure.  Patient says that she has fully recovered.  And turned her oxygen back and.  She was briefly on oxygen at discharge.  Patient says she is feeling much better and is back to baseline.  Patient has underlying moderate  persistent asthma.  Says that her breathing is doing good.  She denies any cough or wheezing.  Was changed from Symbicort to [facility] due to insurance coverage.  Says she is doing well on Breo.  Had no albuterol use.  She was set up for PFTs unfortunately had a stomach bug and had to cancel.  We discussed getting these rescheduled. Has underlying sleep apnea.  Says she is doing well on CPAP.  She says she cannot sleep without it.  She feels rested and feels that she benefits from CPAP.  CPAP download shows 100% compliance.  Daily average usage at 10.5 hours.  Patient is on CPAP 8 cm H2O.  Uses a fullface mask.  AHI is 1.5/hour.  No mask leaks Recently went to [place] [employer] on vacation.  Says it was very nice  Allergies  Allergen Reactions   Doxycycline Rash   Factive [Gemifloxacin Mesylate] Rash   Crestor [Rosuvastatin]     GI upset   Sulfonamide Derivatives     REACTION: rash   Gemifloxacin Rash   Penicillins Hives and Rash    Has patient had a PCN reaction causing immediate rash, facial/tongue/throat swelling, SOB or lightheadedness with hypotension: No Has patient had a PCN reaction causing severe rash involving mucus membranes or skin necrosis: No Has patient had a PCN reaction that required hospitalization No Has patient had a PCN reaction occurring within the last 10 years: No If all of the above answers are "NO", then may proceed with Cephalosporin use.     Immunization History  Administered  Date(s) Administered   Influenza Split 11/29/2010, 11/23/2011   Influenza Whole 11/12/2009   Influenza,inj,Quad PF,6+ Mos 10/22/2012, 12/31/2013, 11/02/2014, 11/28/2015, 11/23/2016, 12/25/2017, 11/03/2018, 11/16/2019, 12/20/2020, 11/01/2021   PFIZER(Purple Top)SARS-COV-2 Vaccination 04/25/2019, 05/20/2019, 02/08/2020   PNEUMOCOCCAL CONJUGATE-20 12/20/2020   Pneumococcal Polysaccharide-23 11/23/2011, 11/16/2019   Td 11/01/2021   Tdap 08/02/2010    Past Medical History:  Diagnosis Date   A-fib  (HCC)    Anxiety disorder    Asthma    Dyspnea    Gastroschisis    umphalocele, rotated organs abd repair until age 59   GERD (gastroesophageal reflux disease)    HTN (hypertension)    Hyperlipidemia    IBS (irritable bowel syndrome)    Morbid obesity (HCC)    Target wt - 185  for BMI < 30   Obesity    OSA on CPAP    SBO (small bowel obstruction) (HCC)    Resolved with NG/Bowel rest around 2009   Sleep apnea    Type II or unspecified type diabetes mellitus without mention of complication, not stated as uncontrolled     Tobacco History: Social History   Tobacco Use  Smoking Status Never  Smokeless Tobacco Never  Tobacco Comments   "tried as a teen"   Counseling given: Not Answered Tobacco comments: "tried as a teen"   Outpatient Medications Prior to Visit  Medication Sig Dispense Refill   albuterol (VENTOLIN HFA) 108 (90 Base) MCG/ACT inhaler Inhale 2 puffs into the lungs every 6 (six) hours as needed for wheezing or shortness of breath. 8 g 2   ALPRAZolam (XANAX) 0.25 MG tablet Take 1 tablet (0.25 mg total) by mouth 3 (three) times daily as needed for anxiety. 270 tablet 0   amiodarone (PACERONE) 200 MG tablet Take 1 tablet (200 mg total) by mouth daily. 90 tablet 0   apixaban (ELIQUIS) 5 MG TABS tablet Take 1 tablet (5 mg total) by mouth 2 (two) times daily. 180 tablet 1   atorvastatin (LIPITOR) 40 MG tablet TAKE ONE TABLET BY MOUTH ONE TIME DAILY 90 tablet 1   Continuous Glucose Sensor (FREESTYLE LIBRE 3 SENSOR) MISC PLACE ONE SENSOR TO THE BACK OF YOUR UPPER ARM. REPLACE EVERY 14 DAYS. 2 each 5   cyanocobalamin (,VITAMIN B-12,) 1000 MCG/ML injection INJECT INTO THE MUSCLE ONCE A MONTH 10 mL 0   diclofenac Sodium (VOLTAREN) 1 % GEL Apply 4 g topically 4 (four) times daily. 350 g 1   fluticasone (FLONASE) 50 MCG/ACT nasal spray Place 1 spray into both nostrils daily. 18 mL 2   fluticasone furoate-vilanterol (BREO ELLIPTA) 100-25 MCG/ACT AEPB Inhale 1 puff into the  lungs daily. 30 each 5   furosemide (LASIX) 20 MG tablet Take 20 mg by mouth daily.     gabapentin (NEURONTIN) 100 MG capsule Take 100 mg by mouth 3 times a day. May add 100 mg every 3 to 5 days until taking 200 mg 3 times daily.     levothyroxine (SYNTHROID) 175 MCG tablet Take 1 tablet (175 mcg total) by mouth daily. 90 tablet 0   metFORMIN (GLUCOPHAGE-XR) 500 MG 24 hr tablet Take 1 tablet (500 mg total) by mouth 2 (two) times daily with a meal. 180 tablet 0   PARoxetine (PAXIL-CR) 25 MG 24 hr tablet TAKE 1 TABLET(25 MG) BY MOUTH DAILY 90 tablet 1   potassium chloride SA (KLOR-CON M15) 15 MEQ tablet Take 1 tablet (15 mEq total) by mouth 2 (two) times daily. 180 tablet 0   losartan (  COZAAR) 25 MG tablet Take 1 tablet (25 mg total) by mouth daily. 30 tablet 0   metoprolol succinate (TOPROL-XL) 25 MG 24 hr tablet Take 0.5 tablets (12.5 mg total) by mouth daily. 15 tablet 0   No facility-administered medications prior to visit.     Review of Systems:   Constitutional:   No  weight loss, night sweats,  Fevers, chills, +fatigue, or  lassitude.  HEENT:   No headaches,  Difficulty swallowing,  Tooth/dental problems, or  Sore throat,                No sneezing, itching, ear ache, nasal congestion, post nasal drip,   CV:  No chest pain,  Orthopnea, PND, swelling in lower extremities, anasarca, dizziness, palpitations, syncope.   GI  No heartburn, indigestion, abdominal pain, nausea, vomiting, diarrhea, change in bowel habits, loss of appetite, bloody stools.   Resp: No shortness of breath with exertion or at rest.  No excess mucus, no productive cough,  No non-productive cough,  No coughing up of blood.  No change in color of mucus.  No wheezing.  No chest wall deformity  Skin: no rash or lesions.  GU: no dysuria, change in color of urine, no urgency or frequency.  No flank pain, no hematuria   MS:  No joint pain or swelling.  No decreased range of motion.  No back pain.    Physical  Exam  BP 126/80 (BP Location: Left Arm, Cuff Size: Normal)   Pulse 74   Temp 97.8 F (36.6 C) (Temporal)   Ht 5\' 7"  (1.702 m)   Wt 207 lb 9.6 oz (94.2 kg)   SpO2 97%   BMI 32.51 kg/m   GEN: A/Ox3; pleasant , NAD, well nourished    HEENT:  [place]/AT,   NOSE-clear, THROAT-clear, no lesions, no postnasal drip or exudate noted.   NECK:  Supple w/ fair ROM; no JVD; normal carotid impulses w/o bruits; no thyromegaly or nodules palpated; no lymphadenopathy.    RESP  Clear  P & A; w/o, wheezes/ rales/ or rhonchi. no accessory muscle use, no dullness to percussion  CARD:  RRR, no m/r/g, no peripheral edema, pulses intact, no cyanosis or clubbing.  GI:   Soft & nt; nml bowel sounds; no organomegaly or masses detected.   Musco: Warm bil, no deformities or joint swelling noted.   Neuro: alert, no focal deficits noted.    Skin: Warm, no lesions or rashes    Lab Results:  CBC   BMET    Imaging: No results found.        No data to display          No results found for: "NITRICOXIDE"      Assessment & Plan:   Asthma Moderate persistent asthma.  Recent hospitalization in March 2024 after viral illness.  Patient has fully recovered.  Is back to baseline.  PFTs are pending.  CT chest and chest x-ray showed no acute process.  Recommend continue on ICS/LABA combo.  If continues to have flareups would increase maintenance therapy.  Also consider adding an albuterol nebs if needed. Asthma action plan discussed.  Continue with trigger prevention Plan  Patient Instructions  Continue on BREO 1 puff daily , rinse after use.  Albuterol inhaler As needed   Continue on CPAP At bedtime   Follow up in 3 months with PFT in [place] with Dr. Belia Heman and As needed      OSA on CPAP Excellent control compliance  on CPAP.  With perceived benefit.  No changes  Plan  Patient Instructions  Continue on BREO 1 puff daily , rinse after use.  Albuterol inhaler As needed   Continue on CPAP  At bedtime   Follow up in 3 months with PFT in [place] with Dr. Belia Heman and As needed      Chronic respiratory failure with hypoxia [facility]) Discharged on oxygen after admission and March for severe asthmatic bronchitic exacerbation.  Now resolved.  Oxygen has been returned back to homecare company O2 saturations today in the office are adequate at 97% on room air     Rubye Oaks, NP 07/12/2022

## 2022-07-25 ENCOUNTER — Other Ambulatory Visit: Payer: Self-pay | Admitting: Internal Medicine

## 2022-07-25 DIAGNOSIS — Z1231 Encounter for screening mammogram for malignant neoplasm of breast: Secondary | ICD-10-CM

## 2022-07-25 LAB — HM DIABETES EYE EXAM

## 2022-07-28 ENCOUNTER — Encounter: Payer: Self-pay | Admitting: Internal Medicine

## 2022-07-31 ENCOUNTER — Ambulatory Visit (INDEPENDENT_AMBULATORY_CARE_PROVIDER_SITE_OTHER): Payer: PPO | Admitting: Internal Medicine

## 2022-07-31 ENCOUNTER — Encounter: Payer: Self-pay | Admitting: Internal Medicine

## 2022-07-31 VITALS — BP 138/72 | HR 87 | Temp 98.5°F | Resp 16 | Ht 67.0 in | Wt 208.0 lb

## 2022-07-31 DIAGNOSIS — I10 Essential (primary) hypertension: Secondary | ICD-10-CM | POA: Diagnosis not present

## 2022-07-31 DIAGNOSIS — N3000 Acute cystitis without hematuria: Secondary | ICD-10-CM | POA: Insufficient documentation

## 2022-07-31 DIAGNOSIS — N390 Urinary tract infection, site not specified: Secondary | ICD-10-CM

## 2022-07-31 MED ORDER — NITROFURANTOIN MONOHYD MACRO 100 MG PO CAPS
100.0000 mg | ORAL_CAPSULE | Freq: Two times a day (BID) | ORAL | 0 refills | Status: AC
Start: 2022-07-31 — End: 2022-08-05

## 2022-07-31 NOTE — Patient Instructions (Signed)
Urinary Tract Infection, Adult  A urinary tract infection (UTI) is an infection of any part of the urinary tract. The urinary tract includes the kidneys, ureters, bladder, and urethra. These organs make, store, and get rid of urine in the body. An upper UTI affects the ureters and kidneys. A lower UTI affects the bladder and urethra. What are the causes? Most urinary tract infections are caused by bacteria in your genital area around your urethra, where urine leaves your body. These bacteria grow and cause inflammation of your urinary tract. What increases the risk? You are more likely to develop this condition if: You have a urinary catheter that stays in place. You are not able to control when you urinate or have a bowel movement (incontinence). You are female and you: Use a spermicide or diaphragm for birth control. Have low estrogen levels. Are pregnant. You have certain genes that increase your risk. You are sexually active. You take antibiotic medicines. You have a condition that causes your flow of urine to slow down, such as: An enlarged prostate, if you are female. Blockage in your urethra. A kidney stone. A nerve condition that affects your bladder control (neurogenic bladder). Not getting enough to drink, or not urinating often. You have certain medical conditions, such as: Diabetes. A weak disease-fighting system (immunesystem). Sickle cell disease. Gout. Spinal cord injury. What are the signs or symptoms? Symptoms of this condition include: Needing to urinate right away (urgency). Frequent urination. This may include small amounts of urine each time you urinate. Pain or burning with urination. Blood in the urine. Urine that smells bad or unusual. Trouble urinating. Cloudy urine. Vaginal discharge, if you are female. Pain in the abdomen or the lower back. You may also have: Vomiting or a decreased appetite. Confusion. Irritability or tiredness. A fever or  chills. Diarrhea. The first symptom in older adults may be confusion. In some cases, they may not have any symptoms until the infection has worsened. How is this diagnosed? This condition is diagnosed based on your medical history and a physical exam. You may also have other tests, including: Urine tests. Blood tests. Tests for STIs (sexually transmitted infections). If you have had more than one UTI, a cystoscopy or imaging studies may be done to determine the cause of the infections. How is this treated? Treatment for this condition includes: Antibiotic medicine. Over-the-counter medicines to treat discomfort. Drinking enough water to stay hydrated. If you have frequent infections or have other conditions such as a kidney stone, you may need to see a health care provider who specializes in the urinary tract (urologist). In rare cases, urinary tract infections can cause sepsis. Sepsis is a life-threatening condition that occurs when the body responds to an infection. Sepsis is treated in the hospital with IV antibiotics, fluids, and other medicines. Follow these instructions at home:  Medicines Take over-the-counter and prescription medicines only as told by your health care provider. If you were prescribed an antibiotic medicine, take it as told by your health care provider. Do not stop using the antibiotic even if you start to feel better. General instructions Make sure you: Empty your bladder often and completely. Do not hold urine for long periods of time. Empty your bladder after sex. Wipe from front to back after urinating or having a bowel movement if you are female. Use each tissue only one time when you wipe. Drink enough fluid to keep your urine pale yellow. Keep all follow-up visits. This is important. Contact a health   care provider if: Your symptoms do not get better after 1-2 days. Your symptoms go away and then return. Get help right away if: You have severe pain in  your back or your lower abdomen. You have a fever or chills. You have nausea or vomiting. Summary A urinary tract infection (UTI) is an infection of any part of the urinary tract, which includes the kidneys, ureters, bladder, and urethra. Most urinary tract infections are caused by bacteria in your genital area. Treatment for this condition often includes antibiotic medicines. If you were prescribed an antibiotic medicine, take it as told by your health care provider. Do not stop using the antibiotic even if you start to feel better. Keep all follow-up visits. This is important. This information is not intended to replace advice given to you by your health care provider. Make sure you discuss any questions you have with your health care provider. Document Revised: 09/06/2019 Document Reviewed: 09/11/2019 Elsevier Patient Education  2024 [employer]  

## 2022-07-31 NOTE — Progress Notes (Signed)
Subjective:  Patient ID: Kirsten [name], female    DOB: 08-Jan-1956  Age: 67 y.o. MRN: [id number]  CC: Hypertension, Diabetes, and Urinary Tract Infection   HPI Bryon Lions Michiels presents for f/up ---  Discussed the use of AI scribe software for clinical note transcription with the patient, who gave verbal consent to proceed.  History of Present Illness   The patient presents with a chief complaint of symptoms consistent with a urinary tract infection (UTI) that has been progressing over the last 10 days. They report urinary urgency and frequency, with a noted decrease in the volume of urine passed. The patient experiences pain towards the end of urination, but denies any burning sensation. They have performed a home urine dipstick test, which showed a high count of white blood cells.  The patient denies any associated symptoms such as nausea, vomiting, fever, chills, abdominal pain, flank pain, back pain, or hematuria. They also deny any respiratory symptoms such as coughing, wheezing, chest pain, or shortness of breath.  The patient has a history of UTIs and is concerned that the current symptoms could progress into a severe infection. They have multiple allergies to antibiotics, including doxycycline, sulfur drugs, and penicillins. They have previously been treated with macrodantin for UTIs, which they report as being effective.       Outpatient Medications Prior to Visit  Medication Sig Dispense Refill   albuterol (VENTOLIN HFA) 108 (90 Base) MCG/ACT inhaler Inhale 2 puffs into the lungs every 6 (six) hours as needed for wheezing or shortness of breath. 8 g 2   ALPRAZolam (XANAX) 0.25 MG tablet Take 1 tablet (0.25 mg total) by mouth 3 (three) times daily as needed for anxiety. 270 tablet 0   amiodarone (PACERONE) 200 MG tablet Take 1 tablet (200 mg total) by mouth daily. 90 tablet 0   apixaban (ELIQUIS) 5 MG TABS tablet Take 1 tablet (5 mg total) by mouth 2 (two) times daily. 180 tablet  1   atorvastatin (LIPITOR) 40 MG tablet TAKE ONE TABLET BY MOUTH ONE TIME DAILY 90 tablet 1   Continuous Glucose Sensor (FREESTYLE LIBRE 3 SENSOR) MISC PLACE ONE SENSOR TO THE BACK OF YOUR UPPER ARM. REPLACE EVERY 14 DAYS. 2 each 5   cyanocobalamin (,VITAMIN B-12,) 1000 MCG/ML injection INJECT INTO THE MUSCLE ONCE A MONTH 10 mL 0   diclofenac Sodium (VOLTAREN) 1 % GEL Apply 4 g topically 4 (four) times daily. 350 g 1   fluticasone (FLONASE) 50 MCG/ACT nasal spray Place 1 spray into both nostrils daily. 18 mL 2   fluticasone furoate-vilanterol (BREO ELLIPTA) 100-25 MCG/ACT AEPB Inhale 1 puff into the lungs daily. 30 each 5   furosemide (LASIX) 20 MG tablet Take 20 mg by mouth daily.     gabapentin (NEURONTIN) 100 MG capsule Take 100 mg by mouth 3 times a day. May add 100 mg every 3 to 5 days until taking 200 mg 3 times daily.     levothyroxine (SYNTHROID) 175 MCG tablet Take 1 tablet (175 mcg total) by mouth daily. 90 tablet 0   metFORMIN (GLUCOPHAGE-XR) 500 MG 24 hr tablet Take 1 tablet (500 mg total) by mouth 2 (two) times daily with a meal. 180 tablet 0   PARoxetine (PAXIL-CR) 25 MG 24 hr tablet TAKE 1 TABLET(25 MG) BY MOUTH DAILY 90 tablet 1   potassium chloride SA (KLOR-CON M15) 15 MEQ tablet Take 1 tablet (15 mEq total) by mouth 2 (two) times daily. 180 tablet 0  losartan (COZAAR) 25 MG tablet Take 1 tablet (25 mg total) by mouth daily. 30 tablet 0   metoprolol succinate (TOPROL-XL) 25 MG 24 hr tablet Take 0.5 tablets (12.5 mg total) by mouth daily. 15 tablet 0   No facility-administered medications prior to visit.    ROS Review of Systems  Constitutional: Negative.  Negative for chills, fatigue and fever.  HENT: Negative.    Eyes: Negative.   Respiratory:  Negative for cough, chest tightness and wheezing.   Cardiovascular:  Negative for chest pain, palpitations and leg swelling.  Gastrointestinal:  Negative for abdominal pain, constipation, diarrhea, nausea and vomiting.   Endocrine: Negative.   Genitourinary:  Positive for difficulty urinating and dysuria. Negative for flank pain, frequency and hematuria.  Musculoskeletal:  Negative for arthralgias and myalgias.  Skin: Negative.  Negative for rash.  Neurological: Negative.  Negative for dizziness, weakness, light-headedness and numbness.  Hematological:  Negative for adenopathy. Does not bruise/bleed easily.  Psychiatric/Behavioral: Negative.      Objective:  BP 138/72 (BP Location: Left Arm, Patient Position: Sitting, Cuff Size: Large)   Pulse 87   Temp 98.5 F (36.9 C) (Oral)   Resp 16   Ht 5\' 7"  (1.702 m)   Wt 208 lb (94.3 kg)   SpO2 95%   BMI 32.58 kg/m   BP Readings from Last 3 Encounters:  07/31/22 138/72  07/12/22 126/80  06/26/22 134/78    Wt Readings from Last 3 Encounters:  07/31/22 208 lb (94.3 kg)  07/12/22 207 lb 9.6 oz (94.2 kg)  06/26/22 205 lb (93 kg)    Physical Exam Vitals reviewed.  Constitutional:      Appearance: She is not ill-appearing.  HENT:     Nose: Nose normal.     Mouth/Throat:     Mouth: Mucous membranes are moist.  Eyes:     General: No scleral icterus.    Conjunctiva/sclera: Conjunctivae normal.  Cardiovascular:     Rate and Rhythm: Normal rate and regular rhythm.     Heart sounds: No murmur heard. Pulmonary:     Effort: Pulmonary effort is normal.     Breath sounds: No stridor. No wheezing, rhonchi or rales.  Abdominal:     General: Abdomen is flat.     Palpations: There is no mass.     Tenderness: There is no abdominal tenderness. There is no guarding.     Hernia: No hernia is present.  Musculoskeletal:     Cervical back: Neck supple.     Right lower leg: No edema.     Left lower leg: No edema.  Lymphadenopathy:     Cervical: No cervical adenopathy.  Skin:    General: Skin is warm and dry.     Coloration: Skin is not pale.  Neurological:     General: No focal deficit present.     Mental Status: She is alert. Mental status is at  baseline.  Psychiatric:        Mood and Affect: Mood normal.        Behavior: Behavior normal.     Lab Results  Component Value Date   WBC 9.0 06/26/2022   HGB 12.7 06/26/2022   HCT 38.8 06/26/2022   PLT 265.0 06/26/2022   GLUCOSE 103 (H) 06/26/2022   CHOL 83 09/30/2021   TRIG 70 09/30/2021   HDL 37 (L) 09/30/2021   LDLDIRECT 63.0 01/15/2019   LDLCALC 32 09/30/2021   ALT 40 04/16/2022   AST 47 (H) 04/16/2022  NA 142 06/26/2022   K 4.3 06/26/2022   CL 103 06/26/2022   CREATININE 0.83 06/26/2022   BUN 15 06/26/2022   CO2 32 06/26/2022   TSH 1.40 02/27/2022   INR 1.3 (H) 04/16/2022   HGBA1C 7.3 (H) 06/26/2022   MICROALBUR <0.7 02/27/2022    CT Angio Chest PE W and/or Wo Contrast  Result Date: 04/17/2022 CLINICAL DATA:  Pulmonary embolism (PE) suspected, high prob. Shortness of breath EXAM: CT ANGIOGRAPHY CHEST WITH CONTRAST TECHNIQUE: Multidetector CT imaging of the chest was performed using the standard protocol during bolus administration of intravenous contrast. Multiplanar CT image reconstructions and MIPs were obtained to evaluate the vascular anatomy. RADIATION DOSE REDUCTION: This exam was performed according to the departmental dose-optimization program which includes automated exposure control, adjustment of the mA and/or kV according to patient size and/or use of iterative reconstruction technique. CONTRAST:  75mL OMNIPAQUE IOHEXOL 350 MG/ML SOLN COMPARISON:  09/29/2021 FINDINGS: Cardiovascular: No filling defects in the pulmonary arteries to suggest pulmonary emboli. Cardiomegaly. Scattered coronary artery and aortic calcifications. Mediastinum/Nodes: No mediastinal, hilar, or axillary adenopathy. Trachea and esophagus are unremarkable. Thyroid unremarkable. Lungs/Pleura: Bibasilar atelectasis.  No effusions. Upper Abdomen: No acute findings. Posterior right diaphragmatic hernia containing fat. Musculoskeletal: Chest wall soft tissues are unremarkable. No acute bony  abnormality. Review of the MIP images confirms the above findings. IMPRESSION: No evidence of pulmonary embolus. Cardiomegaly. Bibasilar atelectasis. Aortic Atherosclerosis (ICD10-I70.0). Electronically Signed   By: Charlett Nose M.D.   On: 04/17/2022 02:00   DG Chest Port 1 View  Result Date: 04/16/2022 CLINICAL DATA:  Chest pain EXAM: PORTABLE CHEST 1 VIEW COMPARISON:  12/05/2021 FINDINGS: Cardiac shadow is enlarged in size. Lungs are well aerated bilaterally. Mild scarring is noted in the bases bilaterally. No focal infiltrate or effusion is seen. No bony abnormality is noted. IMPRESSION: No active disease. Electronically Signed   By: Alcide Clever M.D.   On: 04/16/2022 23:42    Assessment & Plan:   Acute cystitis without hematuria- The urine culture is positive for Klebsiella.  It is sensitive to nitrofurantoin. -     Urinalysis, Routine w reflex microscopic; Future -     CULTURE, URINE COMPREHENSIVE; Future -     Nitrofurantoin Monohyd Macro; Take 1 capsule (100 mg total) by mouth 2 (two) times daily for 5 days.  Dispense: 10 capsule; Refill: 0  Essential hypertension, benign- Her blood pressure is adequately well-controlled.  Recurrent UTI (urinary tract infection) -     Methenamine Hippurate; Take 1 tablet (1 g total) by mouth 2 (two) times daily with a meal.  Dispense: 180 tablet; Refill: 0     Follow-up: Return in about 4 months (around 11/30/2022).  Sanda Linger, MD

## 2022-08-01 DIAGNOSIS — N390 Urinary tract infection, site not specified: Secondary | ICD-10-CM | POA: Insufficient documentation

## 2022-08-01 DIAGNOSIS — M461 Sacroiliitis, not elsewhere classified: Secondary | ICD-10-CM | POA: Diagnosis not present

## 2022-08-01 DIAGNOSIS — M25551 Pain in right hip: Secondary | ICD-10-CM | POA: Diagnosis not present

## 2022-08-01 DIAGNOSIS — M25552 Pain in left hip: Secondary | ICD-10-CM | POA: Diagnosis not present

## 2022-08-01 LAB — URINALYSIS, ROUTINE W REFLEX MICROSCOPIC
Bilirubin Urine: NEGATIVE
Hgb urine dipstick: NEGATIVE
Ketones, ur: NEGATIVE
Nitrite: POSITIVE — AB
Specific Gravity, Urine: 1.025 (ref 1.000–1.030)
Total Protein, Urine: NEGATIVE
Urine Glucose: NEGATIVE
Urobilinogen, UA: 0.2 (ref 0.0–1.0)
pH: 5.5 (ref 5.0–8.0)

## 2022-08-01 MED ORDER — METHENAMINE HIPPURATE 1 G PO TABS
1.0000 g | ORAL_TABLET | Freq: Two times a day (BID) | ORAL | 0 refills | Status: DC
Start: 2022-08-01 — End: 2022-09-04

## 2022-08-03 LAB — CULTURE, URINE COMPREHENSIVE

## 2022-08-12 ENCOUNTER — Other Ambulatory Visit: Payer: Self-pay | Admitting: Internal Medicine

## 2022-08-12 DIAGNOSIS — F418 Other specified anxiety disorders: Secondary | ICD-10-CM

## 2022-08-15 ENCOUNTER — Other Ambulatory Visit: Payer: Self-pay

## 2022-08-15 DIAGNOSIS — M25551 Pain in right hip: Secondary | ICD-10-CM | POA: Diagnosis not present

## 2022-08-15 DIAGNOSIS — G4733 Obstructive sleep apnea (adult) (pediatric): Secondary | ICD-10-CM | POA: Diagnosis not present

## 2022-08-15 DIAGNOSIS — M25552 Pain in left hip: Secondary | ICD-10-CM | POA: Diagnosis not present

## 2022-08-15 DIAGNOSIS — I480 Paroxysmal atrial fibrillation: Secondary | ICD-10-CM

## 2022-08-15 MED ORDER — AMIODARONE HCL 200 MG PO TABS
200.0000 mg | ORAL_TABLET | Freq: Every day | ORAL | 0 refills | Status: AC
Start: 2022-08-15 — End: ?

## 2022-08-20 ENCOUNTER — Emergency Department
Admission: EM | Admit: 2022-08-20 | Discharge: 2022-08-20 | Disposition: A | Discharge status: Home / Self Care | Payer: PPO | Source: Home / Self Care | Attending: Emergency Medicine | Admitting: Emergency Medicine

## 2022-08-20 ENCOUNTER — Emergency Department: Payer: PPO

## 2022-08-20 ENCOUNTER — Other Ambulatory Visit: Payer: Self-pay

## 2022-08-20 DIAGNOSIS — R0789 Other chest pain: Secondary | ICD-10-CM | POA: Diagnosis not present

## 2022-08-20 DIAGNOSIS — R079 Chest pain, unspecified: Secondary | ICD-10-CM | POA: Diagnosis not present

## 2022-08-20 DIAGNOSIS — J9801 Acute bronchospasm: Secondary | ICD-10-CM | POA: Insufficient documentation

## 2022-08-20 LAB — CBC
HCT: 46 % (ref 36.0–46.0)
Hemoglobin: 14.6 g/dL (ref 12.0–15.0)
MCH: 28.6 pg (ref 26.0–34.0)
MCHC: 31.7 g/dL (ref 30.0–36.0)
MCV: 90 fL (ref 80.0–100.0)
Platelets: 390 10*3/uL (ref 150–400)
RBC: 5.11 MIL/uL (ref 3.87–5.11)
RDW: 14.2 % (ref 11.5–15.5)
WBC: 14.2 10*3/uL — ABNORMAL HIGH (ref 4.0–10.5)
nRBC: 0 % (ref 0.0–0.2)

## 2022-08-20 LAB — BASIC METABOLIC PANEL
Anion gap: 10 (ref 5–15)
BUN: 28 mg/dL — ABNORMAL HIGH (ref 8–23)
CO2: 25 mmol/L (ref 22–32)
Calcium: 9.7 mg/dL (ref 8.9–10.3)
Chloride: 101 mmol/L (ref 98–111)
Creatinine, Ser: 0.88 mg/dL (ref 0.44–1.00)
GFR, Estimated: >60 mL/min (ref >60)
Glucose, Bld: 115 mg/dL — ABNORMAL HIGH (ref 70–99)
Potassium: 4 mmol/L (ref 3.5–5.1)
Sodium: 136 mmol/L (ref 135–145)

## 2022-08-20 LAB — BRAIN NATRIURETIC PEPTIDE: B Natriuretic Peptide: 37.9 pg/mL (ref 0.0–100.0)

## 2022-08-20 LAB — TROPONIN I (HIGH SENSITIVITY): Troponin I (High Sensitivity): 17 ng/L (ref <18)

## 2022-08-20 MED ORDER — IPRATROPIUM-ALBUTEROL 0.5-2.5 (3) MG/3ML IN SOLN
3.0000 mL | Freq: Once | RESPIRATORY_TRACT | Status: AC
  Administered 2022-08-20: 3 mL via RESPIRATORY_TRACT
  Filled 2022-08-20: qty 3

## 2022-08-20 MED ORDER — PREDNISONE 50 MG PO TABS: 50.0000 mg | ORAL_TABLET | Freq: Every day | ORAL | 0 refills | Status: DC

## 2022-08-20 NOTE — ED Provider Triage Note (Signed)
Emergency Medicine Provider Triage Evaluation Note  Kirsten [name] , a 67 y.o. female  was evaluated in triage.  Pt complains of cp, chest tightness, taking lasix, followed by [facility] cardiology.  Review of Systems  Positive:  Negative:   Physical Exam  BP (!) 147/61   Pulse 78   Temp 98.2 F (36.8 C)   Resp 18   SpO2 98%  Gen:   Awake, no distress   Resp:  Normal effort  MSK:   Moves extremities without difficulty  Other:    Medical Decision Making  Medically screening exam initiated at 1:51 PM.  Appropriate orders placed.  MAHIA WAIT was informed that the remainder of the evaluation will be completed by another provider, this initial triage assessment does not replace that evaluation, and the importance of remaining in the ED until their evaluation is complete.     Faythe Ghee, PA-C 08/20/22 1351

## 2022-08-20 NOTE — ED Provider Notes (Signed)
[facility] Provider Note    Event Date/Time   First MD Initiated Contact with Patient 08/20/22 [phone]     (approximate)   History   Chest Pain   HPI  Kirsten [name] is a 67 y.o. female who presents with complaints of chest tightness and a feeling that she cannot get a full breath.  She denies pleurisy.  No fevers chills or cough.  Does have a history of chronic bronchitis     Physical Exam   Triage Vital Signs: ED Triage Vitals  Enc Vitals Group     BP 08/20/22 1350 (!) 147/61     Pulse Rate 08/20/22 1350 78     Resp 08/20/22 1350 18     Temp 08/20/22 1350 98.2 F (36.8 C)     Temp src --      SpO2 08/20/22 1350 98 %     Weight --      Height --      Head Circumference --      Peak Flow --      Pain Score 08/20/22 1349 3     Pain Loc --      Pain Edu? --      Excl. in GC? --     Most recent vital signs: Vitals:   08/20/22 1350 08/20/22 1736  BP: (!) 147/61 (!) 144/64  Pulse: 78 74  Resp: 18 18  Temp: 98.2 F (36.8 C) 98.1 F (36.7 C)  SpO2: 98% 98%     General: Awake, no distress.  CV:  Good peripheral perfusion.  Resp:  Normal effort.  Scattered mild wheezes Abd:  No distention.  Other:  no rash   ED Results / Procedures / Treatments   Labs (all labs ordered are listed, but only abnormal results are displayed) Labs Reviewed  BASIC METABOLIC PANEL - Abnormal; Notable for the following components:      Result Value   Glucose, Bld 115 (*)    BUN 28 (*)    All other components within normal limits  CBC - Abnormal; Notable for the following components:   WBC 14.2 (*)    All other components within normal limits  BRAIN NATRIURETIC PEPTIDE  TROPONIN I (HIGH SENSITIVITY)     EKG  ED ECG REPORT I, Jene Every, the attending physician, personally viewed and interpreted this ECG.  Date: 08/20/2022  Rhythm: normal sinus rhythm QRS Axis: normal Intervals: normal ST/T Wave abnormalities: normal Narrative  Interpretation: no evidence of acute ischemia    RADIOLOGY     PROCEDURES:  Critical Care performed:   Procedures   MEDICATIONS ORDERED IN ED: Medications  ipratropium-albuterol (DUONEB) 0.5-2.5 (3) MG/3ML nebulizer solution 3 mL (3 mLs Nebulization Given 08/20/22 1631)  ipratropium-albuterol (DUONEB) 0.5-2.5 (3) MG/3ML nebulizer solution 3 mL (3 mLs Nebulization Given 08/20/22 1631)     IMPRESSION / MDM / ASSESSMENT AND PLAN / ED COURSE  I reviewed the triage vital signs and the nursing notes. Patient's presentation is most consistent with acute presentation with potential threat to life or bodily function.  Patient presents with chest tightness as above, not consistent with ACS, suspect bronchospasm.  No fever to suggest pneumonia.  X-ray is reassuring, lab work unremarkable, high sensitive troponin is normal.  Will treat with DuoNeb and reevaluate  Patient feeling much better after nebulizers, consistent with bronchospasm, will do burst steroids, outpatient follow-up as needed        FINAL CLINICAL IMPRESSION(S) / ED DIAGNOSES  Final diagnoses:  Bronchospasm     Rx / DC Orders   ED Discharge Orders          Ordered    predniSONE (DELTASONE) 50 MG tablet  Daily with breakfast        08/20/22 1710             Note:  This document was prepared using Dragon voice recognition software and may include unintentional dictation errors.   Jene Every, MD 08/20/22 2115

## 2022-08-20 NOTE — ED Triage Notes (Signed)
Pt comes with c/o tightness in chest on right side that started yesterday. Pt states sob as well. Pt is on lasix.

## 2022-08-20 NOTE — ED Notes (Signed)
See triage notes. Patient complaining of not feeling right and chest tightness.

## 2022-08-23 ENCOUNTER — Other Ambulatory Visit: Payer: Self-pay | Admitting: Internal Medicine

## 2022-08-23 DIAGNOSIS — N3000 Acute cystitis without hematuria: Secondary | ICD-10-CM

## 2022-08-23 MED ORDER — NITROFURANTOIN MONOHYD MACRO 100 MG PO CAPS
100.0000 mg | ORAL_CAPSULE | Freq: Two times a day (BID) | ORAL | 0 refills | Status: DC
Start: 2022-08-23 — End: 2022-08-23

## 2022-08-23 NOTE — Telephone Encounter (Signed)
Do you want to repeat UA./lmb

## 2022-08-24 ENCOUNTER — Other Ambulatory Visit: Payer: Self-pay | Admitting: Internal Medicine

## 2022-08-24 DIAGNOSIS — N3000 Acute cystitis without hematuria: Secondary | ICD-10-CM

## 2022-08-24 MED ORDER — CIPROFLOXACIN HCL 250 MG PO TABS
250.0000 mg | ORAL_TABLET | Freq: Two times a day (BID) | ORAL | 0 refills | Status: AC
Start: 2022-08-24 — End: 2022-08-29

## 2022-08-28 ENCOUNTER — Ambulatory Visit: Payer: PPO | Admitting: Internal Medicine

## 2022-09-03 ENCOUNTER — Ambulatory Visit
Admission: RE | Admit: 2022-09-03 | Discharge: 2022-09-03 | Disposition: A | Discharge status: Home / Self Care | Payer: PPO | Source: Ambulatory Visit | Attending: Internal Medicine | Admitting: Internal Medicine

## 2022-09-03 ENCOUNTER — Ambulatory Visit: Payer: PPO

## 2022-09-03 VITALS — Ht 67.0 in | Wt 202.0 lb

## 2022-09-03 DIAGNOSIS — Z0000 Encounter for general adult medical examination without abnormal findings: Secondary | ICD-10-CM

## 2022-09-03 DIAGNOSIS — Z1231 Encounter for screening mammogram for malignant neoplasm of breast: Secondary | ICD-10-CM | POA: Insufficient documentation

## 2022-09-03 LAB — HM MAMMOGRAPHY

## 2022-09-03 NOTE — Patient Instructions (Signed)
Kirsten [name] , Thank you for taking time to come for your Medicare Wellness Visit. I appreciate your ongoing commitment to your health goals. Please review the following plan we discussed and let me know if I can assist you in the future.   These are the goals we discussed:  Goals       Weight (lb) < 200 lb (90.7 kg) (pt-stated)      I would like to loose 15 lbs        This is a list of the screening recommended for you and due dates:  Health Maintenance  Topic Date Due   Eye exam for diabetics  08/24/2022   Zoster (Shingles) Vaccine (1 of 2) 09/26/2022*   Hemoglobin A1C  12/27/2022   Yearly kidney health urinalysis for diabetes  02/28/2023   Complete foot exam   02/28/2023   Yearly kidney function blood test for diabetes  08/20/2023   Mammogram  09/01/2023   Medicare Annual Wellness Visit  09/03/2023   Pap Smear  01/26/2024   Colon Cancer Screening  11/25/2026   DTaP/Tdap/Td vaccine (3 - Td or Tdap) 11/02/2031   Pneumonia Vaccine  Completed   DEXA scan (bone density measurement)  Completed   Hepatitis C Screening  Completed   HIV Screening  Completed   HPV Vaccine  Aged Out   Flu Shot  Discontinued   COVID-19 Vaccine  Discontinued  *Topic was postponed. The date shown is not the original due date.    Advanced directives: Advance directive discussed with you today. Even though you declined this today, please call our office should you change your mind, and we can give you the proper paperwork for you to fill out.   Conditions/risks identified: Remember to schedule your AWV with [place] for next year. Aim for 30 minutes of exercise or brisk walking, 6-8 glasses of water, and 5 servings of fruits and vegetables each day. Each day, aim for 6 glasses of water, plenty of protein in your diet and try to get up and walk/ stretch every hour for 5-10 minutes at a time.    Next appointment: Follow up in one year for your annual wellness visit    Preventive Care 65 Years and Older,  Female Preventive care refers to lifestyle choices and visits with your health care provider that can promote health and wellness. What does preventive care include? A yearly physical exam. This is also called an annual well check. Dental exams once or twice a year. Routine eye exams. Ask your health care provider how often you should have your eyes checked. Personal lifestyle choices, including: Daily care of your teeth and gums. Regular physical activity. Eating a healthy diet. Avoiding tobacco and drug use. Limiting alcohol use. Practicing safe sex. Taking low-dose aspirin every day. Taking vitamin and mineral supplements as recommended by your health care provider. What happens during an annual well check? The services and screenings done by your health care provider during your annual well check will depend on your age, overall health, lifestyle risk factors, and family history of disease. Counseling  Your health care provider may ask you questions about your: Alcohol use. Tobacco use. Drug use. Emotional well-being. Home and relationship well-being. Sexual activity. Eating habits. History of falls. Memory and ability to understand (cognition). Work and work Astronomer. Reproductive health. Screening  You may have the following tests or measurements: Height, weight, and BMI. Blood pressure. Lipid and cholesterol levels. These may be checked every 5 years, or more frequently  if you are over 15 years old. Skin check. Lung cancer screening. You may have this screening every year starting at age 56 if you have a 30-pack-year history of smoking and currently smoke or have quit within the past 15 years. Fecal occult blood test (FOBT) of the stool. You may have this test every year starting at age 25. Flexible sigmoidoscopy or colonoscopy. You may have a sigmoidoscopy every 5 years or a colonoscopy every 10 years starting at age 65. Hepatitis C blood test. Hepatitis B blood  test. Sexually transmitted disease (STD) testing. Diabetes screening. This is done by checking your blood sugar (glucose) after you have not eaten for a while (fasting). You may have this done every 1-3 years. Bone density scan. This is done to screen for osteoporosis. You may have this done starting at age 7. Mammogram. This may be done every 1-2 years. Talk to your health care provider about how often you should have regular mammograms. Talk with your health care provider about your test results, treatment options, and if necessary, the need for more tests. Vaccines  Your health care provider may recommend certain vaccines, such as: Influenza vaccine. This is recommended every year. Tetanus, diphtheria, and acellular pertussis (Tdap, Td) vaccine. You may need a Td booster every 10 years. Zoster vaccine. You may need this after age 37. Pneumococcal 13-valent conjugate (PCV13) vaccine. One dose is recommended after age 60. Pneumococcal polysaccharide (PPSV23) vaccine. One dose is recommended after age 34. Talk to your health care provider about which screenings and vaccines you need and how often you need them. This information is not intended to replace advice given to you by your health care provider. Make sure you discuss any questions you have with your health care provider. Document Released: 02/25/2015 Document Revised: 10/19/2015 Document Reviewed: 11/30/2014 Elsevier Interactive Patient Education  2017 [employer].  Fall Prevention in the Home Falls can cause injuries. They can happen to people of all ages. There are many things you can do to make your home safe and to help prevent falls. What can I do on the outside of my home? Regularly fix the edges of walkways and driveways and fix any cracks. Remove anything that might make you trip as you walk through a door, such as a raised step or threshold. Trim any bushes or trees on the path to your home. Use bright outdoor  lighting. Clear any walking paths of anything that might make someone trip, such as rocks or tools. Regularly check to see if handrails are loose or broken. Make sure that both sides of any steps have handrails. Any raised decks and porches should have guardrails on the edges. Have any leaves, snow, or ice cleared regularly. Use sand or salt on walking paths during winter. Clean up any spills in your garage right away. This includes oil or grease spills. What can I do in the bathroom? Use night lights. Install grab bars by the toilet and in the tub and shower. Do not use towel bars as grab bars. Use non-skid mats or decals in the tub or shower. If you need to sit down in the shower, use a plastic, non-slip stool. Keep the floor dry. Clean up any water that spills on the floor as soon as it happens. Remove soap buildup in the tub or shower regularly. Attach bath mats securely with double-sided non-slip rug tape. Do not have throw rugs and other things on the floor that can make you trip. What can I  do in the bedroom? Use night lights. Make sure that you have a light by your bed that is easy to reach. Do not use any sheets or blankets that are too big for your bed. They should not hang down onto the floor. Have a firm chair that has side arms. You can use this for support while you get dressed. Do not have throw rugs and other things on the floor that can make you trip. What can I do in the kitchen? Clean up any spills right away. Avoid walking on wet floors. Keep items that you use a lot in easy-to-reach places. If you need to reach something above you, use a strong step stool that has a grab bar. Keep electrical cords out of the way. Do not use floor polish or wax that makes floors slippery. If you must use wax, use non-skid floor wax. Do not have throw rugs and other things on the floor that can make you trip. What can I do with my stairs? Do not leave any items on the stairs. Make  sure that there are handrails on both sides of the stairs and use them. Fix handrails that are broken or loose. Make sure that handrails are as long as the stairways. Check any carpeting to make sure that it is firmly attached to the stairs. Fix any carpet that is loose or worn. Avoid having throw rugs at the top or bottom of the stairs. If you do have throw rugs, attach them to the floor with carpet tape. Make sure that you have a light switch at the top of the stairs and the bottom of the stairs. If you do not have them, ask someone to add them for you. What else can I do to help prevent falls? Wear shoes that: Do not have high heels. Have rubber bottoms. Are comfortable and fit you well. Are closed at the toe. Do not wear sandals. If you use a stepladder: Make sure that it is fully opened. Do not climb a closed stepladder. Make sure that both sides of the stepladder are locked into place. Ask someone to hold it for you, if possible. Clearly mark and make sure that you can see: Any grab bars or handrails. First and last steps. Where the edge of each step is. Use tools that help you move around (mobility aids) if they are needed. These include: Canes. Walkers. Scooters. Crutches. Turn on the lights when you go into a dark area. Replace any light bulbs as soon as they burn out. Set up your furniture so you have a clear path. Avoid moving your furniture around. If any of your floors are uneven, fix them. If there are any pets around you, be aware of where they are. Review your medicines with your doctor. Some medicines can make you feel dizzy. This can increase your chance of falling. Ask your doctor what other things that you can do to help prevent falls. This information is not intended to replace advice given to you by your health care provider. Make sure you discuss any questions you have with your health care provider. Document Released: 11/25/2008 Document Revised: 07/07/2015  Document Reviewed: 03/05/2014 Elsevier Interactive Patient Education  2017 [employer].

## 2022-09-03 NOTE — Progress Notes (Signed)
Subjective:   Kirsten [name] is a 67 y.o. female who presents for an Initial Medicare Annual Wellness Visit.  Visit Complete: Virtual  I connected with  Kirsten [name] on 09/03/22 by a audio enabled telemedicine application and verified that I am speaking with the correct person using two identifiers.  Patient Location: Home  Provider Location: Office/Clinic  I discussed the limitations of evaluation and management by telemedicine. The patient expressed understanding and agreed to proceed.  Review of Systems    Cardiac Risk Factors include: advanced age (>6men, >77 women);diabetes mellitus;hypertension;obesity (BMI >30kg/m2)     Objective:    Today's Vitals   09/03/22 1304  Weight: 202 lb (91.6 kg)  Height: 5\' 7"  (1.702 m)   Body mass index is 31.64 kg/m.     09/03/2022    1:12 PM 04/17/2022    5:06 AM 09/29/2021   10:11 AM 05/15/2021    9:43 AM 02/28/2021    4:10 PM 02/28/2021   11:24 AM 12/04/2020    6:50 AM  Advanced Directives  Does Patient Have a Medical Advance Directive? 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  No - Patient declined       Copy of Healthcare Power of Attorney in Chart?  No - copy requested       Would patient like information on creating a medical advance directive? No - Patient declined  No - Patient declined   No - Patient declined No - Patient declined    Current Medications (verified) Outpatient Encounter Medications as of 09/03/2022  Medication Sig   albuterol (VENTOLIN HFA) 108 (90 Base) MCG/ACT inhaler Inhale 2 puffs into the lungs every 6 (six) hours as needed for wheezing or shortness of breath.   ALPRAZolam (XANAX) 0.25 MG tablet Take 1 tablet (0.25 mg total) by mouth 3 (three) times daily as needed for anxiety.   amiodarone (PACERONE) 200 MG  tablet Take 1 tablet (200 mg total) by mouth daily.   apixaban (ELIQUIS) 5 MG TABS tablet Take 1 tablet (5 mg total) by mouth 2 (two) times daily.   atorvastatin (LIPITOR) 40 MG tablet TAKE ONE TABLET BY MOUTH ONE TIME DAILY   Continuous Glucose Sensor (FREESTYLE LIBRE 3 SENSOR) MISC PLACE ONE SENSOR TO THE BACK OF YOUR UPPER ARM. REPLACE EVERY 14 DAYS.   cyanocobalamin (,VITAMIN B-12,) 1000 MCG/ML injection INJECT INTO THE MUSCLE ONCE A MONTH   diclofenac Sodium (VOLTAREN) 1 % GEL Apply 4 g topically 4 (four) times daily.   fluticasone (FLONASE) 50 MCG/ACT nasal spray Place 1 spray into both nostrils daily.   fluticasone furoate-vilanterol (BREO ELLIPTA) 100-25 MCG/ACT AEPB Inhale 1 puff into the lungs daily.   furosemide (LASIX) 20 MG tablet Take 20 mg by mouth daily.   gabapentin (NEURONTIN) 100 MG capsule Take 100 mg by mouth 3 times a day. May add 100 mg every 3 to 5 days until taking 200 mg 3 times daily.   levothyroxine (SYNTHROID) 175 MCG tablet Take 1 tablet (175 mcg total) by mouth daily.   metFORMIN (GLUCOPHAGE-XR) 500 MG 24 hr tablet Take 1 tablet (500 mg total) by mouth 2 (two) times daily with a meal.   methenamine (HIPREX) 1 g tablet Take 1 tablet (1 g total) by mouth 2 (two)  times daily with a meal.   PARoxetine (PAXIL-CR) 25 MG 24 hr tablet TAKE ONE TABLET BY MOUTH ONE TIME DAILY   potassium chloride SA (KLOR-CON M15) 15 MEQ tablet Take 1 tablet (15 mEq total) by mouth 2 (two) times daily.   losartan (COZAAR) 25 MG tablet Take 1 tablet (25 mg total) by mouth daily.   metoprolol succinate (TOPROL-XL) 25 MG 24 hr tablet Take 0.5 tablets (12.5 mg total) by mouth daily.   predniSONE (DELTASONE) 50 MG tablet Take 1 tablet (50 mg total) by mouth daily with breakfast. (Patient not taking: Reported on 09/03/2022)   No facility-administered encounter medications on file as of 09/03/2022.    Allergies (verified) Doxycycline, Factive [gemifloxacin mesylate], Crestor [rosuvastatin],  Sulfonamide derivatives, Gemifloxacin, and Penicillins   History: Past Medical History:  Diagnosis Date   A-fib (HCC)    Anxiety disorder    Asthma    Dyspnea    Gastroschisis    umphalocele, rotated organs abd repair until age 55   GERD (gastroesophageal reflux disease)    HTN (hypertension)    Hyperlipidemia    IBS (irritable bowel syndrome)    Morbid obesity (HCC)    Target wt - 185  for BMI < 30   Obesity    OSA on CPAP    SBO (small bowel obstruction) (HCC)    Resolved with NG/Bowel rest around 2009   Sleep apnea    Type II or unspecified type diabetes mellitus without mention of complication, not stated as uncontrolled    Past Surgical History:  Procedure Laterality Date   BREAST EXCISIONAL BIOPSY Left 03/19/2012   neg   CESAREAN SECTION     CHOLECYSTECTOMY  1992   COLONOSCOPY  2011   2011-normal   Newborn Surgery - GI - ORGANS OUTSIDE ABDOMEN     RIGHT/LEFT HEART CATH AND CORONARY ANGIOGRAPHY N/A 10/02/2021   Procedure: RIGHT/LEFT HEART CATH AND CORONARY ANGIOGRAPHY;  Surgeon: Marcina Millard, MD;  Location: [facility] INVASIVE CV LAB;  Service: Cardiovascular;  Laterality: N/A;   Small Bowel Repair     TUBAL LIGATION  1988   Family History  Problem Relation Age of Onset   Lymphoma Mother    Diabetes type II Father    Colon cancer Father    Diabetes Maternal Grandmother    Diabetes type I Son    Goiter Neg Hx    Breast cancer Neg Hx    Ovarian cancer Neg Hx    Social History   Socioeconomic History   Marital status: Married    Spouse name: Not on file   Number of children: 3   Years of education: Not on file   Highest education level: Not on file  Occupational History   Occupation: Retired    Comment: Scientist, physiological at [employer]  Tobacco Use   Smoking status: Never   Smokeless tobacco: Never   Tobacco comments:    "tried as a teenSystems developer   Vaping status: Never Used  Substance and Sexual Activity   Alcohol use: Yes    Alcohol/week:  2.0 standard drinks of alcohol    Types: 2 Glasses of wine per week    Comment: rare   Drug use: No   Sexual activity: Yes    Birth control/protection: Surgical  Other Topics Concern   Not on file  Social History Narrative   Regular Exercise -  YES   Daily Caffeine Use:  2   Active and independent at baseline  Right handed   Lives in a one story home      Social Determinants of Health   Financial Resource Strain: Low Risk  (09/03/2022)   Overall Financial Resource Strain (CARDIA)    Difficulty of Paying Living Expenses: Not very hard  Food Insecurity: No Food Insecurity (09/03/2022)   Hunger Vital Sign    Worried About Running Out of Food in the Last Year: Never true    Ran Out of Food in the Last Year: Never true  Transportation Needs: No Transportation Needs (09/03/2022)   PRAPARE - Administrator, Civil Service (Medical): No    Lack of Transportation (Non-Medical): No  Physical Activity: Insufficiently Active (09/03/2022)   Exercise Vital Sign    Days of Exercise per Week: 4 days    Minutes of Exercise per Session: 20 min  Stress: No Stress Concern Present (09/03/2022)   [employer] of Occupational Health - Occupational Stress Questionnaire    Feeling of Stress : Not at all  Social Connections: Moderately Integrated (09/03/2022)   Social Connection and Isolation Panel [NHANES]    Frequency of Communication with Friends and Family: More than three times a week    Frequency of Social Gatherings with Friends and Family: Once a week    Attends Religious Services: Never    Database administrator or Organizations: Yes    Attends Engineer, structural: More than 4 times per year    Marital Status: Married    Tobacco Counseling Counseling given: Not Answered Tobacco comments: "tried as a teen"   Clinical Intake:  Pre-visit preparation completed: Yes  Pain : No/denies pain     BMI - recorded: 31.64 Nutritional Status: BMI > 30   Obese Nutritional Risks: None Diabetes: Yes CBG done?: No (per patient-134) Did pt. bring in CBG monitor from home?: No  How often do you need to have someone help you when you read instructions, pamphlets, or other written materials from your doctor or pharmacy?: 1 - Never  Interpreter Needed?: No  Information entered by :: Shemeika Starzyk, RMA   Activities of Daily Living    09/03/2022    1:07 PM 04/17/2022    5:10 AM  In your present state of health, do you have any difficulty performing the following activities:  Hearing? 0   Vision? 0   Difficulty concentrating or making decisions? 0   Walking or climbing stairs? 0   Dressing or bathing? 0   Doing errands, shopping? 0 0  Preparing Food and eating ? N   Using the Toilet? N   In the past six months, have you accidently leaked urine? Y   Comment per patient, itermitten incontinance of urine   Do you have problems with loss of bowel control? N   Managing your Medications? N   Managing your Finances? N   Housekeeping or managing your Housekeeping? N     Patient Care Team: Etta Grandchild, MD as PCP - General Allena Katz Noberto Retort, DO as Consulting Physician (Neurology)  Indicate any recent Medical Services you may have received from other than [facility] providers in the past year (date may be approximate).     Assessment:   This is a routine wellness examination for [place].  Hearing/Vision screen Hearing Screening - Comments:: Denies hearing difficulties    Dietary issues and exercise activities discussed:     Goals Addressed               This Visit's Progress  Weight (lb) < 200 lb (90.7 kg) (pt-stated)        I would like to loose 15 lbs      Depression Screen    09/03/2022    1:18 PM 02/27/2022    3:12 PM 11/01/2021    2:29 PM 07/20/2021    8:55 AM 03/22/2021   10:12 AM 07/19/2020    1:37 PM 12/31/2019   10:00 AM  PHQ 2/9 Scores  PHQ - 2 Score 0 0 0 0 0 0 0  PHQ- 9 Score 0 0 0 2 0 0 0    Fall Risk     09/03/2022    1:12 PM 05/15/2021    9:43 AM 03/22/2021   10:12 AM 07/19/2020    1:37 PM 09/18/2018    4:38 PM  Fall Risk   Falls in the past year? 1 1 1 1  0  Comment per patient lt leg gave out/  Tripped over some grass      Number falls in past yr: 1 1 0 1 0  Injury with Fall? 1 1 1 1  0  Risk for fall due to : No Fall Risks;Medication side effect  History of fall(s)    Follow up Falls prevention discussed  Falls evaluation completed  Falls evaluation completed    MEDICARE RISK AT HOME:  Medicare Risk at Home - 09/03/22 1314     Any stairs in or around the home? Yes    If so, are there any without handrails? Yes    Home free of loose throw rugs in walkways, pet beds, electrical cords, etc? Yes    Adequate lighting in your home to reduce risk of falls? Yes    Life alert? No    Use of a cane, walker or w/c? No    Grab bars in the bathroom? Yes    Shower chair or bench in shower? Yes    Elevated toilet seat or a handicapped toilet? Yes             TIMED UP AND GO:  Was the test performed? No    Cognitive Function:        09/03/2022    1:15 PM  6CIT Screen  What Year? 0 points  What month? 0 points  What time? 0 points  Count back from 20 0 points  Months in reverse 0 points  Repeat phrase 0 points  Total Score 0 points    Immunizations Immunization History  Administered Date(s) Administered   Influenza Split 11/29/2010, 11/23/2011   Influenza Whole 11/12/2009   Influenza,inj,Quad PF,6+ Mos 10/22/2012, 12/31/2013, 11/02/2014, 11/28/2015, 11/23/2016, 12/25/2017, 11/03/2018, 11/16/2019, 12/20/2020, 11/01/2021   PFIZER(Purple Top)SARS-COV-2 Vaccination 04/25/2019, 05/20/2019, 02/08/2020   PNEUMOCOCCAL CONJUGATE-20 12/20/2020   Pneumococcal Polysaccharide-23 11/23/2011, 11/16/2019   Td 11/01/2021   Tdap 08/02/2010    TDAP status: Up to date  Flu Vaccine status: Up to date  Pneumococcal vaccine status: Up to date  Covid-19 vaccine status: Completed  vaccines  Qualifies for Shingles Vaccine? Yes   Zostavax completed No   Shingrix Completed?: No.    Education has been provided regarding the importance of this vaccine. Patient has been advised to call insurance company to determine out of pocket expense if they have not yet received this vaccine. Advised may also receive vaccine at local pharmacy or Health Dept. Verbalized acceptance and understanding.  Screening Tests Health Maintenance  Topic Date Due   OPHTHALMOLOGY EXAM  08/24/2022   Zoster Vaccines- Shingrix (1 of 2) 09/26/2022 (  Originally 08/02/2007)   HEMOGLOBIN A1C  12/27/2022   Diabetic kidney evaluation - Urine ACR  02/28/2023   FOOT EXAM  02/28/2023   Diabetic kidney evaluation - eGFR measurement  08/20/2023   MAMMOGRAM  09/01/2023   Medicare Annual Wellness (AWV)  09/03/2023   PAP SMEAR-Modifier  01/26/2024   Colonoscopy  11/25/2026   DTaP/Tdap/Td (3 - Td or Tdap) 11/02/2031   Pneumonia Vaccine 78+ Years old  Completed   DEXA SCAN  Completed   Hepatitis C Screening  Completed   HIV Screening  Completed   HPV VACCINES  Aged Out   INFLUENZA VACCINE  Discontinued   COVID-19 Vaccine  Discontinued    Health Maintenance  Health Maintenance Due  Topic Date Due   OPHTHALMOLOGY EXAM  08/24/2022    Colorectal cancer screening: Type of screening: Colonoscopy. Completed 11/25/2019. Repeat every 10 years  Mammogram status: Completed 08/31/2021. Repeat every year  Bone Density status: Completed 04/08/2018. Results reflect: Bone density results: OSTEOPENIA. Repeat every 5 years.  Lung Cancer Screening: (Low Dose CT Chest recommended if Age 4-80 years, 20 pack-year currently smoking OR have quit w/in 15years.) does not qualify.   Lung Cancer Screening Referral: N/A  Additional Screening:  Hepatitis C Screening: does qualify; Completed 05/24/2019  Vision Screening: Recommended annual ophthalmology exams for early detection of glaucoma and other disorders of the eye. Is  the patient up to date with their annual eye exam?  Yes 08/2022 Who is the provider or what is the name of the office in which the patient attends annual eye exams? [facility] If pt is not established with a provider, would they like to be referred to a provider to establish care? No .   Dental Screening: Recommended annual dental exams for proper oral hygiene  Diabetic Foot Exam: Diabetic Foot Exam: Completed 02/27/2022  Community Resource Referral / Chronic Care Management: CRR required this visit?  No   CCM required this visit?  No     Plan:     I have personally reviewed and noted the following in the patient's chart:   Medical and social history Use of alcohol, tobacco or illicit drugs  Current medications and supplements including opioid prescriptions. Patient is not currently taking opioid prescriptions. Functional ability and status Nutritional status Physical activity Advanced directives List of other physicians Hospitalizations, surgeries, and ER visits in previous 12 months Vitals Screenings to include cognitive, depression, and falls Referrals and appointments  In addition, I have reviewed and discussed with patient certain preventive protocols, quality metrics, and best practice recommendations. A written personalized care plan for preventive services as well as general preventive health recommendations were provided to patient.     Briscoe Daniello L Daniya Aramburo, CMA   09/03/2022   After Visit Summary: (MyChart) Due to this being a telephonic visit, the after visit summary with patients personalized plan was offered to patient via MyChart   Nurse Notes: Patient will call office back to schedule appointment for next year.

## 2022-09-04 ENCOUNTER — Ambulatory Visit: Payer: PPO | Admitting: Internal Medicine

## 2022-09-04 ENCOUNTER — Encounter: Payer: Self-pay | Admitting: Internal Medicine

## 2022-09-04 ENCOUNTER — Ambulatory Visit (INDEPENDENT_AMBULATORY_CARE_PROVIDER_SITE_OTHER): Payer: PPO | Admitting: Internal Medicine

## 2022-09-04 VITALS — BP 126/78 | HR 58 | Temp 98.3°F | Ht 67.0 in | Wt 210.0 lb

## 2022-09-04 DIAGNOSIS — E118 Type 2 diabetes mellitus with unspecified complications: Secondary | ICD-10-CM

## 2022-09-04 DIAGNOSIS — E785 Hyperlipidemia, unspecified: Secondary | ICD-10-CM | POA: Diagnosis not present

## 2022-09-04 DIAGNOSIS — N390 Urinary tract infection, site not specified: Secondary | ICD-10-CM

## 2022-09-04 DIAGNOSIS — Z7984 Long term (current) use of oral hypoglycemic drugs: Secondary | ICD-10-CM

## 2022-09-04 DIAGNOSIS — I10 Essential (primary) hypertension: Secondary | ICD-10-CM

## 2022-09-04 DIAGNOSIS — E039 Hypothyroidism, unspecified: Secondary | ICD-10-CM

## 2022-09-04 LAB — CBC WITH DIFFERENTIAL/PLATELET
Basophils Absolute: 0.1 10*3/uL (ref 0.0–0.1)
Basophils Relative: 0.5 % (ref 0.0–3.0)
Eosinophils Absolute: 0.2 10*3/uL (ref 0.0–0.7)
Eosinophils Relative: 1.6 % (ref 0.0–5.0)
HCT: 40.4 % (ref 36.0–46.0)
Hemoglobin: 12.9 g/dL (ref 12.0–15.0)
Lymphocytes Relative: 33.8 % (ref 12.0–46.0)
Lymphs Abs: 3.4 10*3/uL (ref 0.7–4.0)
MCHC: 31.8 g/dL (ref 30.0–36.0)
MCV: 90.5 fl (ref 78.0–100.0)
Monocytes Absolute: 0.7 10*3/uL (ref 0.1–1.0)
Monocytes Relative: 6.6 % (ref 3.0–12.0)
Neutro Abs: 5.8 10*3/uL (ref 1.4–7.7)
Neutrophils Relative %: 57.5 % (ref 43.0–77.0)
Platelets: 234 10*3/uL (ref 150.0–400.0)
RBC: 4.46 Mil/uL (ref 3.87–5.11)
RDW: 14.8 % (ref 11.5–15.5)
WBC: 10.1 10*3/uL (ref 4.0–10.5)

## 2022-09-04 LAB — URINALYSIS, ROUTINE W REFLEX MICROSCOPIC
Bilirubin Urine: NEGATIVE
Hgb urine dipstick: NEGATIVE
Ketones, ur: NEGATIVE
Nitrite: NEGATIVE
RBC / HPF: NONE SEEN (ref 0–?)
Specific Gravity, Urine: 1.015 (ref 1.000–1.030)
Total Protein, Urine: NEGATIVE
Urine Glucose: NEGATIVE
Urobilinogen, UA: 0.2 (ref 0.0–1.0)
pH: 6 (ref 5.0–8.0)

## 2022-09-04 LAB — LDL CHOLESTEROL, DIRECT: Direct LDL: 86 mg/dL

## 2022-09-04 LAB — HEMOGLOBIN A1C: Hgb A1c MFr Bld: 7.2 % — ABNORMAL HIGH (ref 4.6–6.5)

## 2022-09-04 LAB — LIPID PANEL
Cholesterol: 171 mg/dL (ref 0–200)
HDL: 55.4 mg/dL (ref >39.00)
NonHDL: 115.38
Total CHOL/HDL Ratio: 3
Triglycerides: 257 mg/dL — ABNORMAL HIGH (ref 0.0–149.0)
VLDL: 51.4 mg/dL — ABNORMAL HIGH (ref 0.0–40.0)

## 2022-09-04 LAB — TSH: TSH: 1.56 u[IU]/mL (ref 0.35–5.50)

## 2022-09-04 NOTE — Progress Notes (Unsigned)
Subjective:  Patient ID: Kirsten [name], female    DOB: 24-Feb-1955  Age: 67 y.o. MRN: [id number]  CC: Urinary Tract Infection, Hypothyroidism, Anemia, Diabetes, and Atrial Fibrillation   HPI Kirsten [name] presents for f/up ----          Outpatient Medications Prior to Visit  Medication Sig Dispense Refill   albuterol (VENTOLIN HFA) 108 (90 Base) MCG/ACT inhaler Inhale 2 puffs into the lungs every 6 (six) hours as needed for wheezing or shortness of breath. 8 g 2   ALPRAZolam (XANAX) 0.25 MG tablet Take 1 tablet (0.25 mg total) by mouth 3 (three) times daily as needed for anxiety. 270 tablet 0   amiodarone (PACERONE) 200 MG tablet Take 1 tablet (200 mg total) by mouth daily. 90 tablet 0   apixaban (ELIQUIS) 5 MG TABS tablet Take 1 tablet (5 mg total) by mouth 2 (two) times daily. 180 tablet 1   atorvastatin (LIPITOR) 40 MG tablet TAKE ONE TABLET BY MOUTH ONE TIME DAILY 90 tablet 1   Continuous Glucose Sensor (FREESTYLE LIBRE 3 SENSOR) MISC PLACE ONE SENSOR TO THE BACK OF YOUR UPPER ARM. REPLACE EVERY 14 DAYS. 2 each 5   cyanocobalamin (,VITAMIN B-12,) 1000 MCG/ML injection INJECT INTO THE MUSCLE ONCE A MONTH 10 mL 0   diclofenac Sodium (VOLTAREN) 1 % GEL Apply 4 g topically 4 (four) times daily. 350 g 1   fluticasone (FLONASE) 50 MCG/ACT nasal spray Place 1 spray into both nostrils daily. 18 mL 2   fluticasone furoate-vilanterol (BREO ELLIPTA) 100-25 MCG/ACT AEPB Inhale 1 puff into the lungs daily. 30 each 5   furosemide (LASIX) 20 MG tablet Take 20 mg by mouth daily.     gabapentin (NEURONTIN) 100 MG capsule Take 100 mg by mouth 3 times a day. May add 100 mg every 3 to 5 days until taking 200 mg 3 times daily.     levothyroxine (SYNTHROID) 175 MCG tablet Take 1 tablet (175 mcg total) by mouth daily. 90 tablet 0   metFORMIN (GLUCOPHAGE-XR) 500 MG 24 hr tablet Take 1 tablet (500 mg total) by mouth 2 (two) times daily with a meal. 180 tablet 0   PARoxetine (PAXIL-CR) 25 MG 24 hr  tablet TAKE ONE TABLET BY MOUTH ONE TIME DAILY 90 tablet 1   potassium chloride SA (KLOR-CON M15) 15 MEQ tablet Take 1 tablet (15 mEq total) by mouth 2 (two) times daily. 180 tablet 0   methenamine (HIPREX) 1 g tablet Take 1 tablet (1 g total) by mouth 2 (two) times daily with a meal. 180 tablet 0   predniSONE (DELTASONE) 50 MG tablet Take 1 tablet (50 mg total) by mouth daily with breakfast. 5 tablet 0   losartan (COZAAR) 25 MG tablet Take 1 tablet (25 mg total) by mouth daily. 30 tablet 0   metoprolol succinate (TOPROL-XL) 25 MG 24 hr tablet Take 0.5 tablets (12.5 mg total) by mouth daily. 15 tablet 0   No facility-administered medications prior to visit.    ROS Review of Systems  Objective:  BP 126/78 (BP Location: Right Arm, Patient Position: Sitting, Cuff Size: Large)   Pulse (!) 58   Temp 98.3 F (36.8 C) (Oral)   Ht 5\' 7"  (1.702 m)   Wt 210 lb (95.3 kg)   SpO2 98%   BMI 32.89 kg/m   BP Readings from Last 3 Encounters:  09/04/22 126/78  08/20/22 (!) 144/64  07/31/22 138/72    Wt Readings from Last 3 Encounters:  09/04/22 210 lb (95.3 kg)  09/03/22 202 lb (91.6 kg)  07/31/22 208 lb (94.3 kg)    Physical Exam Cardiovascular:     Rate and Rhythm: Bradycardia present.     Lab Results  Component Value Date   WBC 10.1 09/04/2022   HGB 12.9 09/04/2022   HCT 40.4 09/04/2022   PLT 234.0 09/04/2022   GLUCOSE 115 (H) 08/20/2022   CHOL 171 09/04/2022   TRIG 257.0 (H) 09/04/2022   HDL 55.40 09/04/2022   LDLDIRECT 86.0 09/04/2022   LDLCALC 32 09/30/2021   ALT 40 04/16/2022   AST 47 (H) 04/16/2022   NA 136 08/20/2022   K 4.0 08/20/2022   CL 101 08/20/2022   CREATININE 0.88 08/20/2022   BUN 28 (H) 08/20/2022   CO2 25 08/20/2022   TSH 1.56 09/04/2022   INR 1.3 (H) 04/16/2022   HGBA1C 7.2 (H) 09/04/2022   MICROALBUR <0.7 02/27/2022    MM 3D SCREENING MAMMOGRAM BILATERAL BREAST  Result Date: 09/04/2022 CLINICAL DATA:  Screening. EXAM: DIGITAL SCREENING  BILATERAL MAMMOGRAM WITH TOMOSYNTHESIS AND CAD TECHNIQUE: Bilateral screening digital craniocaudal and mediolateral oblique mammograms were obtained. Bilateral screening digital breast tomosynthesis was performed. The images were evaluated with computer-aided detection. COMPARISON:  Previous exam(s). ACR Breast Density Category a: The breasts are almost entirely fatty. FINDINGS: There are no findings suspicious for malignancy. IMPRESSION: No mammographic evidence of malignancy. A result letter of this screening mammogram will be mailed directly to the patient. RECOMMENDATION: Screening mammogram in one year. (Code:SM-B-01Y) BI-RADS CATEGORY  1: Negative. Electronically Signed   By: Bary Richard M.D.   On: 09/04/2022 12:48    Assessment & Plan:  Hyperlipidemia with target LDL less than 100 -     Lipid panel; Future -     TSH; Future  Essential hypertension, benign -     CBC with Differential/Platelet; Future -     Urinalysis, Routine w reflex microscopic; Future  Recurrent UTI (urinary tract infection) -     Urinalysis, Routine w reflex microscopic; Future -     CT RENAL STONE STUDY; Future  Hypothyroidism, unspecified type -     TSH; Future  Type II diabetes mellitus with manifestations (HCC) -     Hemoglobin A1c; Future  Other orders -     LDL cholesterol, direct     Follow-up: Return in about 4 months (around 01/05/2023).  Sanda Linger, MD

## 2022-09-04 NOTE — Patient Instructions (Signed)
Urinary Tract Infection, Adult  A urinary tract infection (UTI) is an infection of any part of the urinary tract. The urinary tract includes the kidneys, ureters, bladder, and urethra. These organs make, store, and get rid of urine in the body. An upper UTI affects the ureters and kidneys. A lower UTI affects the bladder and urethra. What are the causes? Most urinary tract infections are caused by bacteria in your genital area around your urethra, where urine leaves your body. These bacteria grow and cause inflammation of your urinary tract. What increases the risk? You are more likely to develop this condition if: You have a urinary catheter that stays in place. You are not able to control when you urinate or have a bowel movement (incontinence). You are female and you: Use a spermicide or diaphragm for birth control. Have low estrogen levels. Are pregnant. You have certain genes that increase your risk. You are sexually active. You take antibiotic medicines. You have a condition that causes your flow of urine to slow down, such as: An enlarged prostate, if you are female. Blockage in your urethra. A kidney stone. A nerve condition that affects your bladder control (neurogenic bladder). Not getting enough to drink, or not urinating often. You have certain medical conditions, such as: Diabetes. A weak disease-fighting system (immunesystem). Sickle cell disease. Gout. Spinal cord injury. What are the signs or symptoms? Symptoms of this condition include: Needing to urinate right away (urgency). Frequent urination. This may include small amounts of urine each time you urinate. Pain or burning with urination. Blood in the urine. Urine that smells bad or unusual. Trouble urinating. Cloudy urine. Vaginal discharge, if you are female. Pain in the abdomen or the lower back. You may also have: Vomiting or a decreased appetite. Confusion. Irritability or tiredness. A fever or  chills. Diarrhea. The first symptom in older adults may be confusion. In some cases, they may not have any symptoms until the infection has worsened. How is this diagnosed? This condition is diagnosed based on your medical history and a physical exam. You may also have other tests, including: Urine tests. Blood tests. Tests for STIs (sexually transmitted infections). If you have had more than one UTI, a cystoscopy or imaging studies may be done to determine the cause of the infections. How is this treated? Treatment for this condition includes: Antibiotic medicine. Over-the-counter medicines to treat discomfort. Drinking enough water to stay hydrated. If you have frequent infections or have other conditions such as a kidney stone, you may need to see a health care provider who specializes in the urinary tract (urologist). In rare cases, urinary tract infections can cause sepsis. Sepsis is a life-threatening condition that occurs when the body responds to an infection. Sepsis is treated in the hospital with IV antibiotics, fluids, and other medicines. Follow these instructions at home:  Medicines Take over-the-counter and prescription medicines only as told by your health care provider. If you were prescribed an antibiotic medicine, take it as told by your health care provider. Do not stop using the antibiotic even if you start to feel better. General instructions Make sure you: Empty your bladder often and completely. Do not hold urine for long periods of time. Empty your bladder after sex. Wipe from front to back after urinating or having a bowel movement if you are female. Use each tissue only one time when you wipe. Drink enough fluid to keep your urine pale yellow. Keep all follow-up visits. This is important. Contact a health   care provider if: Your symptoms do not get better after 1-2 days. Your symptoms go away and then return. Get help right away if: You have severe pain in  your back or your lower abdomen. You have a fever or chills. You have nausea or vomiting. Summary A urinary tract infection (UTI) is an infection of any part of the urinary tract, which includes the kidneys, ureters, bladder, and urethra. Most urinary tract infections are caused by bacteria in your genital area. Treatment for this condition often includes antibiotic medicines. If you were prescribed an antibiotic medicine, take it as told by your health care provider. Do not stop using the antibiotic even if you start to feel better. Keep all follow-up visits. This is important. This information is not intended to replace advice given to you by your health care provider. Make sure you discuss any questions you have with your health care provider. Document Revised: 09/06/2019 Document Reviewed: 09/11/2019 Elsevier Patient Education  2024 [employer]  

## 2022-09-10 DIAGNOSIS — M461 Sacroiliitis, not elsewhere classified: Secondary | ICD-10-CM | POA: Diagnosis not present

## 2022-09-11 ENCOUNTER — Other Ambulatory Visit: Payer: Self-pay | Admitting: Internal Medicine

## 2022-09-11 ENCOUNTER — Ambulatory Visit
Admission: RE | Admit: 2022-09-11 | Discharge: 2022-09-11 | Disposition: A | Discharge status: Home / Self Care | Payer: PPO | Source: Ambulatory Visit | Attending: Internal Medicine | Admitting: Internal Medicine

## 2022-09-11 DIAGNOSIS — I5181 Takotsubo syndrome: Secondary | ICD-10-CM | POA: Diagnosis not present

## 2022-09-11 DIAGNOSIS — I480 Paroxysmal atrial fibrillation: Secondary | ICD-10-CM | POA: Diagnosis not present

## 2022-09-11 DIAGNOSIS — E119 Type 2 diabetes mellitus without complications: Secondary | ICD-10-CM | POA: Diagnosis not present

## 2022-09-11 DIAGNOSIS — N390 Urinary tract infection, site not specified: Secondary | ICD-10-CM

## 2022-09-11 DIAGNOSIS — Z87440 Personal history of urinary (tract) infections: Secondary | ICD-10-CM | POA: Diagnosis not present

## 2022-09-19 DIAGNOSIS — G4733 Obstructive sleep apnea (adult) (pediatric): Secondary | ICD-10-CM | POA: Diagnosis not present

## 2022-09-25 ENCOUNTER — Ambulatory Visit: Payer: PPO

## 2022-09-27 ENCOUNTER — Encounter (INDEPENDENT_AMBULATORY_CARE_PROVIDER_SITE_OTHER): Payer: Self-pay

## 2022-10-02 ENCOUNTER — Telehealth: Payer: Self-pay | Admitting: Adult Health

## 2022-10-02 ENCOUNTER — Other Ambulatory Visit: Payer: Self-pay

## 2022-10-02 ENCOUNTER — Emergency Department: Payer: PPO

## 2022-10-02 ENCOUNTER — Inpatient Hospital Stay
Admission: EM | Admit: 2022-10-02 | Discharge: 2022-10-07 | DRG: 193 | Disposition: A | Discharge status: Home / Self Care | Payer: PPO | Attending: Internal Medicine | Admitting: Internal Medicine

## 2022-10-02 ENCOUNTER — Encounter: Payer: Self-pay | Admitting: Emergency Medicine

## 2022-10-02 ENCOUNTER — Observation Stay: Payer: PPO

## 2022-10-02 DIAGNOSIS — E872 Acidosis, unspecified: Secondary | ICD-10-CM | POA: Diagnosis present

## 2022-10-02 DIAGNOSIS — J189 Pneumonia, unspecified organism: Secondary | ICD-10-CM | POA: Diagnosis present

## 2022-10-02 DIAGNOSIS — I5033 Acute on chronic diastolic (congestive) heart failure: Secondary | ICD-10-CM | POA: Diagnosis not present

## 2022-10-02 DIAGNOSIS — K7581 Nonalcoholic steatohepatitis (NASH): Secondary | ICD-10-CM | POA: Diagnosis present

## 2022-10-02 DIAGNOSIS — K219 Gastro-esophageal reflux disease without esophagitis: Secondary | ICD-10-CM | POA: Diagnosis present

## 2022-10-02 DIAGNOSIS — Z79899 Other long term (current) drug therapy: Secondary | ICD-10-CM

## 2022-10-02 DIAGNOSIS — G4733 Obstructive sleep apnea (adult) (pediatric): Secondary | ICD-10-CM

## 2022-10-02 DIAGNOSIS — R54 Age-related physical debility: Secondary | ICD-10-CM | POA: Diagnosis present

## 2022-10-02 DIAGNOSIS — F411 Generalized anxiety disorder: Secondary | ICD-10-CM | POA: Diagnosis present

## 2022-10-02 DIAGNOSIS — R6520 Severe sepsis without septic shock: Secondary | ICD-10-CM | POA: Diagnosis not present

## 2022-10-02 DIAGNOSIS — J449 Chronic obstructive pulmonary disease, unspecified: Secondary | ICD-10-CM | POA: Diagnosis not present

## 2022-10-02 DIAGNOSIS — I480 Paroxysmal atrial fibrillation: Secondary | ICD-10-CM | POA: Diagnosis present

## 2022-10-02 DIAGNOSIS — F418 Other specified anxiety disorders: Secondary | ICD-10-CM | POA: Diagnosis present

## 2022-10-02 DIAGNOSIS — J45909 Unspecified asthma, uncomplicated: Secondary | ICD-10-CM | POA: Diagnosis not present

## 2022-10-02 DIAGNOSIS — A419 Sepsis, unspecified organism: Secondary | ICD-10-CM | POA: Diagnosis present

## 2022-10-02 DIAGNOSIS — Z888 Allergy status to other drugs, medicaments and biological substances status: Secondary | ICD-10-CM

## 2022-10-02 DIAGNOSIS — J181 Lobar pneumonia, unspecified organism: Secondary | ICD-10-CM | POA: Diagnosis not present

## 2022-10-02 DIAGNOSIS — Z1152 Encounter for screening for COVID-19: Secondary | ICD-10-CM

## 2022-10-02 DIAGNOSIS — R0602 Shortness of breath: Secondary | ICD-10-CM | POA: Diagnosis not present

## 2022-10-02 DIAGNOSIS — I517 Cardiomegaly: Secondary | ICD-10-CM | POA: Diagnosis not present

## 2022-10-02 DIAGNOSIS — Z881 Allergy status to other antibiotic agents status: Secondary | ICD-10-CM

## 2022-10-02 DIAGNOSIS — R32 Unspecified urinary incontinence: Secondary | ICD-10-CM | POA: Diagnosis present

## 2022-10-02 DIAGNOSIS — I2721 Secondary pulmonary arterial hypertension: Secondary | ICD-10-CM | POA: Diagnosis present

## 2022-10-02 DIAGNOSIS — Z880 Allergy status to penicillin: Secondary | ICD-10-CM

## 2022-10-02 DIAGNOSIS — Z9049 Acquired absence of other specified parts of digestive tract: Secondary | ICD-10-CM

## 2022-10-02 DIAGNOSIS — R918 Other nonspecific abnormal finding of lung field: Secondary | ICD-10-CM | POA: Diagnosis not present

## 2022-10-02 DIAGNOSIS — Z7901 Long term (current) use of anticoagulants: Secondary | ICD-10-CM

## 2022-10-02 DIAGNOSIS — J9601 Acute respiratory failure with hypoxia: Secondary | ICD-10-CM

## 2022-10-02 DIAGNOSIS — E785 Hyperlipidemia, unspecified: Secondary | ICD-10-CM | POA: Diagnosis present

## 2022-10-02 DIAGNOSIS — J45901 Unspecified asthma with (acute) exacerbation: Secondary | ICD-10-CM

## 2022-10-02 DIAGNOSIS — J4530 Mild persistent asthma, uncomplicated: Secondary | ICD-10-CM | POA: Diagnosis present

## 2022-10-02 DIAGNOSIS — I2510 Atherosclerotic heart disease of native coronary artery without angina pectoris: Secondary | ICD-10-CM | POA: Diagnosis present

## 2022-10-02 DIAGNOSIS — Z807 Family history of other malignant neoplasms of lymphoid, hematopoietic and related tissues: Secondary | ICD-10-CM

## 2022-10-02 DIAGNOSIS — E118 Type 2 diabetes mellitus with unspecified complications: Secondary | ICD-10-CM | POA: Diagnosis present

## 2022-10-02 DIAGNOSIS — Z7951 Long term (current) use of inhaled steroids: Secondary | ICD-10-CM

## 2022-10-02 DIAGNOSIS — F32A Depression, unspecified: Secondary | ICD-10-CM | POA: Diagnosis present

## 2022-10-02 DIAGNOSIS — Z833 Family history of diabetes mellitus: Secondary | ICD-10-CM

## 2022-10-02 DIAGNOSIS — J9621 Acute and chronic respiratory failure with hypoxia: Secondary | ICD-10-CM | POA: Diagnosis present

## 2022-10-02 DIAGNOSIS — Z800 Family history of malignant neoplasm of digestive organs: Secondary | ICD-10-CM

## 2022-10-02 DIAGNOSIS — M1611 Unilateral primary osteoarthritis, right hip: Secondary | ICD-10-CM | POA: Diagnosis present

## 2022-10-02 DIAGNOSIS — J4541 Moderate persistent asthma with (acute) exacerbation: Secondary | ICD-10-CM | POA: Diagnosis present

## 2022-10-02 DIAGNOSIS — E039 Hypothyroidism, unspecified: Secondary | ICD-10-CM | POA: Diagnosis present

## 2022-10-02 DIAGNOSIS — E1165 Type 2 diabetes mellitus with hyperglycemia: Secondary | ICD-10-CM | POA: Diagnosis present

## 2022-10-02 DIAGNOSIS — Z7984 Long term (current) use of oral hypoglycemic drugs: Secondary | ICD-10-CM

## 2022-10-02 DIAGNOSIS — I110 Hypertensive heart disease with heart failure: Secondary | ICD-10-CM | POA: Diagnosis present

## 2022-10-02 DIAGNOSIS — Z6830 Body mass index (BMI) 30.0-30.9, adult: Secondary | ICD-10-CM

## 2022-10-02 DIAGNOSIS — I10 Essential (primary) hypertension: Secondary | ICD-10-CM | POA: Diagnosis present

## 2022-10-02 DIAGNOSIS — Z882 Allergy status to sulfonamides status: Secondary | ICD-10-CM

## 2022-10-02 DIAGNOSIS — Z79890 Hormone replacement therapy (postmenopausal): Secondary | ICD-10-CM

## 2022-10-02 LAB — LACTIC ACID, PLASMA
Lactic Acid, Venous: 1.9 mmol/L (ref 0.5–1.9)
Lactic Acid, Venous: 3.5 mmol/L (ref 0.5–1.9)
Lactic Acid, Venous: 3.5 mmol/L (ref 0.5–1.9)
Lactic Acid, Venous: 2.4 mmol/L (ref 0.5–1.9)

## 2022-10-02 LAB — CBC
HCT: 40.3 % (ref 36.0–46.0)
Hemoglobin: 12.9 g/dL (ref 12.0–15.0)
MCH: 29.3 pg (ref 26.0–34.0)
MCHC: 32 g/dL (ref 30.0–36.0)
MCV: 91.4 fL (ref 80.0–100.0)
Platelets: 202 10*3/uL (ref 150–400)
RBC: 4.41 MIL/uL (ref 3.87–5.11)
RDW: 14.1 % (ref 11.5–15.5)
WBC: 7.7 10*3/uL (ref 4.0–10.5)
nRBC: 0 % (ref 0.0–0.2)

## 2022-10-02 LAB — BASIC METABOLIC PANEL
Anion gap: 8 (ref 5–15)
BUN: 14 mg/dL (ref 8–23)
CO2: 25 mmol/L (ref 22–32)
Calcium: 9.1 mg/dL (ref 8.9–10.3)
Chloride: 103 mmol/L (ref 98–111)
Creatinine, Ser: 0.81 mg/dL (ref 0.44–1.00)
GFR, Estimated: >60 mL/min (ref >60)
Glucose, Bld: 218 mg/dL — ABNORMAL HIGH (ref 70–99)
Potassium: 3.8 mmol/L (ref 3.5–5.1)
Sodium: 136 mmol/L (ref 135–145)

## 2022-10-02 LAB — HEPATIC FUNCTION PANEL
ALT: 25 U/L (ref 0–44)
AST: 26 U/L (ref 15–41)
Albumin: 3.4 g/dL — ABNORMAL LOW (ref 3.5–5.0)
Alkaline Phosphatase: 62 U/L (ref 38–126)
Bilirubin, Direct: <0.1 mg/dL (ref 0.0–0.2)
Total Bilirubin: 0.7 mg/dL (ref 0.3–1.2)
Total Protein: 6.7 g/dL (ref 6.5–8.1)

## 2022-10-02 LAB — PROCALCITONIN: Procalcitonin: <0.1 ng/mL

## 2022-10-02 LAB — GLUCOSE, CAPILLARY: Glucose-Capillary: 187 mg/dL — ABNORMAL HIGH (ref 70–99)

## 2022-10-02 LAB — RESP PANEL BY RT-PCR (RSV, FLU A&B, COVID)  RVPGX2
Influenza A by PCR: NEGATIVE
Influenza B by PCR: NEGATIVE
Resp Syncytial Virus by PCR: NEGATIVE
SARS Coronavirus 2 by RT PCR: NEGATIVE

## 2022-10-02 LAB — CBG MONITORING, ED: Glucose-Capillary: 338 mg/dL — ABNORMAL HIGH (ref 70–99)

## 2022-10-02 LAB — TROPONIN I (HIGH SENSITIVITY): Troponin I (High Sensitivity): 18 ng/L — ABNORMAL HIGH (ref <18)

## 2022-10-02 MED ORDER — BUTALBITAL-APAP-CAFFEINE 50-325-40 MG PO TABS
1.0000 | ORAL_TABLET | Freq: Four times a day (QID) | ORAL | Status: AC | PRN
  Administered 2022-10-02 – 2022-10-03 (×3): 1 via ORAL
  Filled 2022-10-02 (×3): qty 1

## 2022-10-02 MED ORDER — FUROSEMIDE 20 MG PO TABS
20.0000 mg | ORAL_TABLET | Freq: Every day | ORAL | Status: DC
  Administered 2022-10-03 – 2022-10-07 (×5): 20 mg via ORAL
  Filled 2022-10-02 (×5): qty 1

## 2022-10-02 MED ORDER — VANCOMYCIN HCL 1250 MG/250ML IV SOLN: 1250.0000 mg | INTRAVENOUS | Status: DC

## 2022-10-02 MED ORDER — APIXABAN 5 MG PO TABS
5.0000 mg | ORAL_TABLET | Freq: Two times a day (BID) | ORAL | Status: DC
  Administered 2022-10-02 – 2022-10-07 (×10): 5 mg via ORAL
  Filled 2022-10-02 (×10): qty 1

## 2022-10-02 MED ORDER — PAROXETINE HCL ER 12.5 MG PO TB24
25.0000 mg | ORAL_TABLET | Freq: Every day | ORAL | Status: DC
  Administered 2022-10-03 – 2022-10-07 (×5): 25 mg via ORAL
  Filled 2022-10-02 (×6): qty 2

## 2022-10-02 MED ORDER — METOPROLOL SUCCINATE ER 25 MG PO TB24
12.5000 mg | ORAL_TABLET | Freq: Every day | ORAL | Status: DC
  Administered 2022-10-03 – 2022-10-07 (×5): 12.5 mg via ORAL
  Filled 2022-10-02 (×5): qty 1

## 2022-10-02 MED ORDER — SODIUM CHLORIDE 0.9 % IV BOLUS
1000.0000 mL | Freq: Once | INTRAVENOUS | Status: AC
  Administered 2022-10-02: 1000 mL via INTRAVENOUS

## 2022-10-02 MED ORDER — SODIUM CHLORIDE 0.9 % IV SOLN
2.0000 g | Freq: Three times a day (TID) | INTRAVENOUS | Status: DC
  Filled 2022-10-02: qty 12.5

## 2022-10-02 MED ORDER — DICLOFENAC SODIUM 1 % EX GEL: 4.0000 g | Freq: Four times a day (QID) | CUTANEOUS | Status: DC | PRN

## 2022-10-02 MED ORDER — METOPROLOL TARTRATE 5 MG/5ML IV SOLN: 2.5000 mg | INTRAVENOUS | Status: DC | PRN

## 2022-10-02 MED ORDER — ALPRAZOLAM 0.25 MG PO TABS
0.2500 mg | ORAL_TABLET | Freq: Three times a day (TID) | ORAL | Status: DC | PRN
  Administered 2022-10-02 – 2022-10-07 (×10): 0.25 mg via ORAL
  Filled 2022-10-02 (×10): qty 1

## 2022-10-02 MED ORDER — SODIUM CHLORIDE 0.9 % IV SOLN
2.0000 g | Freq: Once | INTRAVENOUS | Status: AC
  Administered 2022-10-02: 2 g via INTRAVENOUS
  Filled 2022-10-02: qty 12.5

## 2022-10-02 MED ORDER — SODIUM CHLORIDE 0.9 % IV SOLN
500.0000 mg | INTRAVENOUS | Status: AC
  Administered 2022-10-03 – 2022-10-06 (×4): 500 mg via INTRAVENOUS
  Filled 2022-10-02 (×4): qty 5

## 2022-10-02 MED ORDER — VANCOMYCIN HCL 1750 MG/350ML IV SOLN
1750.0000 mg | Freq: Once | INTRAVENOUS | Status: AC
  Administered 2022-10-02: 1750 mg via INTRAVENOUS
  Filled 2022-10-02: qty 350

## 2022-10-02 MED ORDER — ACETAMINOPHEN 325 MG PO TABS
650.0000 mg | ORAL_TABLET | Freq: Four times a day (QID) | ORAL | Status: AC | PRN
  Administered 2022-10-02 – 2022-10-03 (×3): 650 mg via ORAL
  Filled 2022-10-02 (×3): qty 2

## 2022-10-02 MED ORDER — FLUTICASONE PROPIONATE 50 MCG/ACT NA SUSP: 1.0000 | Freq: Every day | NASAL | Status: DC | PRN

## 2022-10-02 MED ORDER — ONDANSETRON HCL 4 MG PO TABS: 4.0000 mg | ORAL_TABLET | Freq: Four times a day (QID) | ORAL | Status: AC | PRN

## 2022-10-02 MED ORDER — INSULIN ASPART 100 UNIT/ML IJ SOLN
0.0000 [IU] | Freq: Every day | INTRAMUSCULAR | Status: DC
  Administered 2022-10-03 – 2022-10-06 (×3): 2 [IU] via SUBCUTANEOUS
  Filled 2022-10-02 (×3): qty 1

## 2022-10-02 MED ORDER — HYDRALAZINE HCL 10 MG PO TABS: 10.0000 mg | ORAL_TABLET | Freq: Four times a day (QID) | ORAL | Status: AC | PRN

## 2022-10-02 MED ORDER — SODIUM CHLORIDE 0.9 % IV SOLN
500.0000 mg | Freq: Once | INTRAVENOUS | Status: AC
  Administered 2022-10-02: 500 mg via INTRAVENOUS
  Filled 2022-10-02: qty 5

## 2022-10-02 MED ORDER — ATORVASTATIN CALCIUM 20 MG PO TABS
40.0000 mg | ORAL_TABLET | Freq: Every day | ORAL | Status: DC
  Administered 2022-10-03 – 2022-10-06 (×4): 40 mg via ORAL
  Filled 2022-10-02 (×4): qty 2

## 2022-10-02 MED ORDER — FLUTICASONE FUROATE-VILANTEROL 100-25 MCG/ACT IN AEPB
1.0000 | INHALATION_SPRAY | Freq: Every day | RESPIRATORY_TRACT | Status: DC
  Administered 2022-10-03 – 2022-10-07 (×5): 1 via RESPIRATORY_TRACT
  Filled 2022-10-02: qty 28

## 2022-10-02 MED ORDER — SENNOSIDES-DOCUSATE SODIUM 8.6-50 MG PO TABS: 1.0000 | ORAL_TABLET | Freq: Every evening | ORAL | Status: DC | PRN

## 2022-10-02 MED ORDER — SODIUM CHLORIDE 0.9 % IV BOLUS (SEPSIS)
1000.0000 mL | Freq: Once | INTRAVENOUS | Status: AC
  Administered 2022-10-02: 1000 mL via INTRAVENOUS

## 2022-10-02 MED ORDER — KETOROLAC TROMETHAMINE 15 MG/ML IJ SOLN
15.0000 mg | Freq: Once | INTRAMUSCULAR | Status: AC
  Administered 2022-10-02: 15 mg via INTRAVENOUS
  Filled 2022-10-02: qty 1

## 2022-10-02 MED ORDER — INSULIN ASPART 100 UNIT/ML IJ SOLN
0.0000 [IU] | Freq: Three times a day (TID) | INTRAMUSCULAR | Status: DC
  Administered 2022-10-02: 15 [IU] via SUBCUTANEOUS
  Administered 2022-10-03: 4 [IU] via SUBCUTANEOUS
  Administered 2022-10-03: 7 [IU] via SUBCUTANEOUS
  Administered 2022-10-03: 11 [IU] via SUBCUTANEOUS
  Administered 2022-10-04: 7 [IU] via SUBCUTANEOUS
  Administered 2022-10-04 – 2022-10-05 (×2): 3 [IU] via SUBCUTANEOUS
  Administered 2022-10-05: 7 [IU] via SUBCUTANEOUS
  Administered 2022-10-05: 4 [IU] via SUBCUTANEOUS
  Administered 2022-10-06: 15 [IU] via SUBCUTANEOUS
  Administered 2022-10-06: 4 [IU] via SUBCUTANEOUS
  Administered 2022-10-06: 3 [IU] via SUBCUTANEOUS
  Filled 2022-10-02 (×13): qty 1

## 2022-10-02 MED ORDER — AMIODARONE HCL 200 MG PO TABS
200.0000 mg | ORAL_TABLET | Freq: Every day | ORAL | Status: DC
  Administered 2022-10-03 – 2022-10-07 (×5): 200 mg via ORAL
  Filled 2022-10-02 (×5): qty 1

## 2022-10-02 MED ORDER — LORAZEPAM 0.5 MG PO TABS: 0.5000 mg | ORAL_TABLET | Freq: Four times a day (QID) | ORAL | Status: DC | PRN

## 2022-10-02 MED ORDER — METHYLPREDNISOLONE SODIUM SUCC 125 MG IJ SOLR
125.0000 mg | Freq: Once | INTRAMUSCULAR | Status: AC
  Administered 2022-10-02: 125 mg via INTRAVENOUS
  Filled 2022-10-02: qty 2

## 2022-10-02 MED ORDER — SODIUM CHLORIDE 0.9 % IV SOLN: INTRAVENOUS | Status: DC

## 2022-10-02 MED ORDER — IPRATROPIUM-ALBUTEROL 0.5-2.5 (3) MG/3ML IN SOLN
3.0000 mL | Freq: Four times a day (QID) | RESPIRATORY_TRACT | Status: AC
  Administered 2022-10-02 – 2022-10-03 (×3): 3 mL via RESPIRATORY_TRACT
  Filled 2022-10-02 (×4): qty 3

## 2022-10-02 MED ORDER — IOHEXOL 300 MG/ML  SOLN
75.0000 mL | Freq: Once | INTRAMUSCULAR | Status: AC | PRN
  Administered 2022-10-02: 75 mL via INTRAVENOUS

## 2022-10-02 MED ORDER — IPRATROPIUM-ALBUTEROL 0.5-2.5 (3) MG/3ML IN SOLN
6.0000 mL | Freq: Once | RESPIRATORY_TRACT | Status: AC
  Administered 2022-10-02: 6 mL via RESPIRATORY_TRACT
  Filled 2022-10-02: qty 6

## 2022-10-02 MED ORDER — GABAPENTIN 100 MG PO CAPS
200.0000 mg | ORAL_CAPSULE | Freq: Three times a day (TID) | ORAL | Status: DC
  Administered 2022-10-02 – 2022-10-07 (×15): 200 mg via ORAL
  Filled 2022-10-02 (×15): qty 2

## 2022-10-02 MED ORDER — LEVOTHYROXINE SODIUM 50 MCG PO TABS
175.0000 ug | ORAL_TABLET | Freq: Every day | ORAL | Status: DC
  Administered 2022-10-03 – 2022-10-07 (×5): 175 ug via ORAL
  Filled 2022-10-02 (×5): qty 1

## 2022-10-02 MED ORDER — ENOXAPARIN SODIUM 40 MG/0.4ML IJ SOSY: 40.0000 mg | PREFILLED_SYRINGE | INTRAMUSCULAR | Status: DC

## 2022-10-02 MED ORDER — SODIUM CHLORIDE 0.9 % IV SOLN
2.0000 g | Freq: Three times a day (TID) | INTRAVENOUS | Status: DC
  Administered 2022-10-02 – 2022-10-03 (×2): 2 g via INTRAVENOUS
  Filled 2022-10-02 (×3): qty 12.5

## 2022-10-02 MED ORDER — POLYETHYLENE GLYCOL 3350 17 G PO PACK
17.0000 g | PACK | Freq: Two times a day (BID) | ORAL | Status: AC | PRN
  Filled 2022-10-02: qty 1

## 2022-10-02 MED ORDER — METHYLPREDNISOLONE SODIUM SUCC 40 MG IJ SOLR
40.0000 mg | Freq: Two times a day (BID) | INTRAMUSCULAR | Status: AC
  Administered 2022-10-03: 40 mg via INTRAVENOUS
  Filled 2022-10-02: qty 1

## 2022-10-02 MED ORDER — ACETAMINOPHEN 650 MG RE SUPP: 650.0000 mg | Freq: Four times a day (QID) | RECTAL | Status: AC | PRN

## 2022-10-02 MED ORDER — LOSARTAN POTASSIUM 25 MG PO TABS
25.0000 mg | ORAL_TABLET | Freq: Every day | ORAL | Status: DC
  Administered 2022-10-03 – 2022-10-07 (×5): 25 mg via ORAL
  Filled 2022-10-02 (×5): qty 1

## 2022-10-02 MED ORDER — ONDANSETRON HCL 4 MG/2ML IJ SOLN: 4.0000 mg | Freq: Four times a day (QID) | INTRAMUSCULAR | Status: AC | PRN

## 2022-10-02 NOTE — Telephone Encounter (Signed)
Pt.Calling having bad cough and SOB but she wanted a acute visit today but have none to give please advise pt.

## 2022-10-02 NOTE — H&P (Addendum)
History and Physical   Kirsten [name] ZOX:[id number] DOB: 10/02/1955 DOA: 10/02/2022  PCP: Etta Grandchild, MD  Outpatient Specialists: Dr. Madelyn Brunner Pulmonology Patient coming from: Home  I have personally briefly reviewed patient's old medical records in [facility] EMR.  Chief Concern: Shortness of breath  HPI: Ms. 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  Per ED documentation and outpatient on-call nurse documentation, patient had SpO2 saturations in the mid 80s with exertion on room air.  Vitals in the ED showed temperature of 98.7, respiration rate of 20, heart rate of 92, blood pressure 170/103, SpO2 of 93% on room air.  Serum sodium is 136, potassium 3.8, chloride 103, bicarb 25, BUN of 14, serum creatinine of 0.81, nonfasting blood glucose 218, WBC 7.7, hemoglobin 12.9, platelets of 202.  Chest x-ray 2 views: Read as hazy opacity at the left lung base could represent atelectasis or infection.  Possible trace right pleural effusion.  ED treatment: DuoNebs one-time treatment, Toradol 15 mg IV one-time dose, Solu-Medrol, cefepime, azithromycin 500 mg IV, sodium chloride 1 L bolus. ---------------------------------- At bedside, patient was able to tell me her name, age, current   She felt fatigue for about two weeks and generalized weakness/malaise.   She endorses coughing that started Sunday, crackling cough, nonproductive. She denies sick contact.   She reports the fatigue improved with rest. She endorses unchanged orthopnea, at baseline she sleeps with 2 pillows under her head and this has remained the same. She reports no swelling of her lower  extremities and denies weight gain.   She endorses compliance with her home eliquis.  She denies history of blood clots.  She denies known family history of blood clots.  She reports chills and fever on Sunday, Tmax of 100.2.  This has not reoccurred since then.  Social history: She lives with her husband. She denies tobacco use, etoh, and recreational drug use. She is retired and formerly worked in a bank.   ROS: Constitutional: no weight change, no fever ENT/Mouth: no sore throat, no rhinorrhea Eyes: no eye pain, no vision changes Cardiovascular: no chest pain, + dyspnea,  no edema, no palpitations Respiratory: + cough, no sputum, no wheezing Gastrointestinal: no nausea, no vomiting, no diarrhea, no constipation Genitourinary: no urinary incontinence, no dysuria, no hematuria Musculoskeletal: no arthralgias, no myalgias Skin: no skin lesions, no pruritus, Neuro: + weakness, no loss of consciousness, no syncope Psych: no anxiety, no depression, + decrease appetite Heme/Lymph: no bruising, no bleeding  ED Course: Discussed with emergency medicine provider, patient requiring hospitalization for chief concerns of shortness of breath with mild hypoxia.  Assessment/Plan  Principal Problem:   Shortness of breath Active Problems:   Asthma, mild persistent   Acute hypoxic respiratory failure (HCC)   Depression with anxiety   OSA on CPAP   Hypothyroidism   Essential hypertension, benign   Paroxysmal atrial fibrillation (HCC)   Type II diabetes mellitus with manifestations (HCC)   Hyperlipidemia with target LDL less than 100   GAD (generalized anxiety disorder)   NASH (nonalcoholic steatohepatitis)   Primary osteoarthritis of right hip   Assessment and Plan:  * Shortness of breath Acute hypoxic respiratory failure Etiology workup in progress, differentials include asthma exacerbation versus community-acquired pneumonia, viral pneumonia Patient is status  post cefepime and  azithromycin IV one-time dose per EDP I will order a procalcitonin on admission, and if that is positive we will initiate with antibiotic for bacterial pneumonia coverage If procalcitonin is negative, unlikely to be bacterial pneumonia and IV antibiotic not indicated DuoNebs scheduled treatment 3 times daily, 3 doses ordered Patient may need to be evaluated for chronic versus as needed oxygen requirement Patient may also benefit from outpatient PFT per PCP/outpatient pulmonologist  Asthma, mild persistent Home maintenance inhaler Breo Ellipta 1 puff daily resumed DuoNebs scheduled 3 doses ordered on admission Azithromycin 500 mg IV daily, 4 additional doses ordered for anti-inflammatory benefits  OSA on CPAP CPAP nightly ordered  Depression with anxiety Home paroxetine 25 mg daily, as needed Xanax p.o. 3 times daily as needed for anxiety resumed  Paroxysmal atrial fibrillation (HCC) Home apixaban 5 mg p.o. twice daily resumed Amiodarone 200 mg daily resumed  Essential hypertension, benign Home metoprolol succinate 12.5 mg daily, losartan 25 mg daily, furosemide 20 mg daily, were resumed on admission Hydralazine 10 mg p.o. every 6 hours as needed for SBP greater than 165, 4 days of coverage ordered  Hypothyroidism Home levothyroxine 175 mcg daily before breakfast resumed  GAD (generalized anxiety disorder) Home paroxetine 25 mg daily and alprazolam 0.25 mg p.o. 3 times daily as needed for anxiety resumed  Hyperlipidemia with target LDL less than 100 Atorvastatin 40 mg nightly resumed  Type II diabetes mellitus with manifestations (HCC) Non-insulin-dependent diabetes mellitus Patient's last A1c on 09/04/2022 was 7.2, patient would benefit from outpatient follow-up and initiation of maintenance and as needed insulin at home Home metformin not on admission Insulin SSI with at bedtime coverage, steroid dosing ordered  Chart reviewed.   DVT prophylaxis: Apixaban 5 mg p.o. twice  daily Code Status: Full code Diet: Heart healthy/carb modified Family Communication: a phone call was offered, patient declined Disposition Plan: Pending clinical course Consults called: None at this time Admission status: Telemetry medical, observation  Past Medical History:  Diagnosis Date   A-fib (HCC)    Anxiety disorder    Asthma    Dyspnea    Gastroschisis    umphalocele, rotated organs abd repair until age 73   GERD (gastroesophageal reflux disease)    HTN (hypertension)    Hyperlipidemia    IBS (irritable bowel syndrome)    Morbid obesity (HCC)    Target wt - 185  for BMI < 30   Obesity    OSA on CPAP    SBO (small bowel obstruction) (HCC)    Resolved with NG/Bowel rest around 2009   Sleep apnea    Type II or unspecified type diabetes mellitus without mention of complication, not stated as uncontrolled    Past Surgical History:  Procedure Laterality Date   BREAST EXCISIONAL BIOPSY Left 03/19/2012   neg   CESAREAN SECTION     CHOLECYSTECTOMY  1992   COLONOSCOPY  2011   2011-normal   Newborn Surgery - GI - ORGANS OUTSIDE ABDOMEN     RIGHT/LEFT HEART CATH AND CORONARY ANGIOGRAPHY N/A 10/02/2021   Procedure: RIGHT/LEFT HEART CATH AND CORONARY ANGIOGRAPHY;  Surgeon: Marcina Millard, MD;  Location: [facility] INVASIVE CV LAB;  Service: Cardiovascular;  Laterality: N/A;   Small Bowel Repair     TUBAL LIGATION  1988   Social History:  reports that she has never smoked. She has never used smokeless tobacco. She reports current alcohol use of about 2.0 standard drinks of alcohol per week. She reports that she does  not use drugs.  Allergies  Allergen Reactions   Doxycycline Rash   Factive [Gemifloxacin Mesylate] Rash   Crestor [Rosuvastatin]     GI upset   Sulfonamide Derivatives     REACTION: rash   Gemifloxacin Rash   Penicillins Hives and Rash    Has patient had a PCN reaction causing immediate rash, facial/tongue/throat swelling, SOB or lightheadedness with  hypotension: No Has patient had a PCN reaction causing severe rash involving mucus membranes or skin necrosis: No Has patient had a PCN reaction that required hospitalization No Has patient had a PCN reaction occurring within the last 10 years: No If all of the above answers are "NO", then may proceed with Cephalosporin use.    Family History  Problem Relation Age of Onset   Lymphoma Mother    Diabetes type II Father    Colon cancer Father    Diabetes Maternal Grandmother    Diabetes type I Son    Goiter Neg Hx    Breast cancer Neg Hx    Ovarian cancer Neg Hx    Family history: Family history reviewed and not pertinent.  Prior to Admission medications   Medication Sig Start Date End Date Taking? Authorizing Provider  ALPRAZolam (XANAX) 0.25 MG tablet Take 1 tablet (0.25 mg total) by mouth 3 (three) times daily as needed for anxiety. 06/26/22  Yes Etta Grandchild, MD  amiodarone (PACERONE) 200 MG tablet Take 1 tablet (200 mg total) by mouth daily. 08/15/22  Yes Laurier Nancy, MD  atorvastatin (LIPITOR) 40 MG tablet TAKE ONE TABLET BY MOUTH ONE TIME DAILY 06/26/22  Yes Etta Grandchild, MD  cyanocobalamin (,VITAMIN B-12,) 1000 MCG/ML injection INJECT INTO THE MUSCLE ONCE A MONTH 08/21/21  Yes Patel, Donika K, DO  diclofenac Sodium (VOLTAREN) 1 % GEL Apply 4 g topically 4 (four) times daily. 04/19/22  Yes Arnetha Courser, MD  ELIQUIS 5 MG TABS tablet TAKE ONE TABLET BY MOUTH TWICE A DAY 09/11/22  Yes Etta Grandchild, MD  fluticasone [facility]) 50 MCG/ACT nasal spray Place 1 spray into both nostrils daily. 04/20/22  Yes Arnetha Courser, MD  fluticasone furoate-vilanterol (BREO ELLIPTA) 100-25 MCG/ACT AEPB Inhale 1 puff into the lungs daily. 04/26/22  Yes Parrett, Tammy S, NP  furosemide (LASIX) 20 MG tablet Take 20 mg by mouth daily.   Yes [provider]  gabapentin (NEURONTIN) 100 MG capsule Take 100 mg by mouth 3 times a day. May add 100 mg every 3 to 5 days until taking 200 mg 3 times  daily. 04/15/22  Yes [provider]  levothyroxine (SYNTHROID) 175 MCG tablet Take 1 tablet (175 mcg total) by mouth daily. 06/26/22  Yes Etta Grandchild, MD  losartan (COZAAR) 25 MG tablet Take 1 tablet (25 mg total) by mouth daily. 10/07/21 10/02/22 Yes Charise Killian, MD  metFORMIN (GLUCOPHAGE-XR) 500 MG 24 hr tablet Take 1 tablet (500 mg total) by mouth 2 (two) times daily with a meal. 06/26/22  Yes Etta Grandchild, MD  metoprolol succinate (TOPROL-XL) 25 MG 24 hr tablet Take 0.5 tablets (12.5 mg total) by mouth daily. 10/07/21 10/02/22 Yes Charise Killian, MD  PARoxetine (PAXIL-CR) 25 MG 24 hr tablet TAKE ONE TABLET BY MOUTH ONE TIME DAILY 08/12/22  Yes Etta Grandchild, MD  albuterol (VENTOLIN HFA) 108 (90 Base) MCG/ACT inhaler Inhale 2 puffs into the lungs every 6 (six) hours as needed for wheezing or shortness of breath. 04/19/22   Arnetha Courser, MD  potassium chloride SA (KLOR-CON M15) 15 MEQ tablet Take 1 tablet (15 mEq total) by mouth 2 (two) times daily. Patient not taking: Reported on 10/02/2022 11/02/21   Etta Grandchild, MD   Physical Exam: Vitals:   10/02/22 0955 10/02/22 1330 10/02/22 1440 10/02/22 1443  BP: (!) 170/103 (!) 146/71 (!) 140/59   Pulse: 92 77 87   Resp: 20 (!) 24 (!) 22   Temp: 98.7 F (37.1 C)   98.7 F (37.1 C)  TempSrc: Oral   Oral  SpO2: 93% 97% 96%   Weight: 88.5 kg     Height: 5\' 7"  (1.702 m)      Constitutional: appears frail, age appropriate, NAD, calm Eyes: PERRL, lids and conjunctivae normal ENMT: Mucous membranes are moist. Posterior pharynx clear of any exudate or lesions. Age-appropriate dentition. Hearing appropriate Neck: normal, supple, no masses, no thyromegaly Respiratory: clear to auscultation bilaterally, no wheezing, no crackles. Normal respiratory effort. No accessory muscle use.  Cardiovascular: Regular rate and rhythm, no murmurs / rubs / gallops. No extremity edema. 2+ pedal pulses. No carotid bruits.  Abdomen: Obese abdomen,  no tenderness, no masses palpated, no hepatosplenomegaly. Bowel sounds positive.  Musculoskeletal: no clubbing / cyanosis. No joint deformity upper and lower extremities. Good ROM, no contractures, no atrophy. Normal muscle tone.  Skin: no rashes, lesions, ulcers. No induration. Multiple large scar, history of omphalocele repairs during childhood Neurologic: Sensation intact. Strength 5/5 in all 4.  Psychiatric: Normal judgment and insight. Alert and oriented x 3. Normal mood.   EKG: independently reviewed, showing sinus rhythm with rate of 88, QTc 491  Chest x-ray on Admission: I personally reviewed and I agree with radiologist reading as below.  DG Chest 2 View  Result Date: 10/02/2022 CLINICAL DATA:  COPD EXAM: CHEST - 2 VIEW COMPARISON:  08/20/22 CXR FINDINGS: Cardiomegaly. Possible trace right pleural effusion. No pneumothorax. Hazy opacity at the left lung base could represent atelectasis or infection. No radiographically apparent displaced rib fractures. Visualized upper abdomen unremarkable. IMPRESSION: 1. Hazy opacity at the left lung base could represent atelectasis or infection. 2. Possible trace right pleural effusion. Electronically Signed   By: Lorenza Cambridge M.D.   On: 10/02/2022 11:33    Labs on Admission: I have personally reviewed following labs  CBC: Recent Labs  Lab 10/02/22 1014  WBC 7.7  HGB 12.9  HCT 40.3  MCV 91.4  PLT 202   Basic Metabolic Panel: Recent Labs  Lab 10/02/22 1014  NA 136  K 3.8  CL 103  CO2 25  GLUCOSE 218*  BUN 14  CREATININE 0.81  CALCIUM 9.1   GFR: Estimated Creatinine Clearance: 79.1 mL/min (by C-G formula based on SCr of 0.81 mg/dL).  Liver Function Tests: Recent Labs  Lab 10/02/22 1222  AST 26  ALT 25  ALKPHOS 62  BILITOT 0.7  PROT 6.7  ALBUMIN 3.4*   Urine analysis:    Component Value Date/Time   COLORURINE YELLOW 09/04/2022 1447   APPEARANCEUR Sl Cloudy (A) 09/04/2022 1447   APPEARANCEUR Cloudy (A) 01/01/2017 1403    LABSPEC 1.015 09/04/2022 1447   PHURINE 6.0 09/04/2022 1447   GLUCOSEU NEGATIVE 09/04/2022 1447   HGBUR NEGATIVE 09/04/2022 1447   HGBUR trace-lysed 12/20/2009 1107   BILIRUBINUR NEGATIVE 09/04/2022 1447   BILIRUBINUR neg 06/06/2020 1553   BILIRUBINUR Negative 01/01/2017 1403   KETONESUR NEGATIVE 09/04/2022 1447   PROTEINUR 30 (A) 04/17/2022 0136   UROBILINOGEN 0.2 09/04/2022 1447   NITRITE NEGATIVE 09/04/2022 1447  LEUKOCYTESUR SMALL (A) 09/04/2022 1447   This document was prepared using Dragon Voice Recognition software and may include unintentional dictation errors.  Dr. Sedalia Muta [facility]  If 7PM-7AM, please contact overnight-coverage provider If 7AM-7PM, please contact day attending provider [email or web address]  10/02/2022, 3:57 PM

## 2022-10-02 NOTE — Consult Note (Signed)
PHARMACY -  BRIEF ANTIBIOTIC NOTE   Pharmacy has received consult(s) for Cefepime from an ED provider.  The patient's profile has been reviewed for ht/wt/allergies/indication/available labs.    One time order(s) placed for Cefepime 2g IV.  Further antibiotics/pharmacy consults should be ordered by admitting physician if indicated.                       Thank you, Bettey Costa 10/02/2022  12:02 PM

## 2022-10-02 NOTE — Telephone Encounter (Signed)
Patient is aware of recommendations and voiced her understanding.  She will present to ED. Nothing further needed.

## 2022-10-02 NOTE — Progress Notes (Deleted)
       CROSS COVER NOTE  [name] MRN: [id number] DOB : 1955/07/21    Concern as stated by nurse / staff   lab just called me with a critical lactic acid of 3.5   6:33 PM JB Jennelle Human, RN Follow up Lactic 3.5, Thanks!      Pertinent findings on chart review: H&P done earlier today reviewed: Patient admitted with respiratory failure with differential of asthma exacerbation versus CAP, received antibiotics in the ED but not continued due to low suspicion for sepsis: Procalcitonin<0.1 Lactic Acid, Venous Respiratory viral panel negative    Component Value Date/Time   LATICACIDVEN 3.5 (HH) 10/02/2022 1844      10/02/2022    6:00 PM 10/02/2022    2:40 PM 10/02/2022    1:30 PM  Vitals with BMI  Systolic 163 140 829  Diastolic 64 59 71  Pulse 78 87 77   Chest x-ray on admission:Hazy opacity at the left lung base could represent atelectasis or infection     Latest Ref Rng & Units 10/02/2022   10:14 AM 09/04/2022    2:47 PM 08/20/2022    1:50 PM  CBC  WBC 4.0 - 10.5 K/uL 7.7  10.1  14.2   Hemoglobin 12.0 - 15.0 g/dL 56.2  13.0  86.5   Hematocrit 36.0 - 46.0 % 40.3  40.4  46.0   Platelets 150 - 400 K/uL 202  234.0  390       Assessment and  Interventions   Assessment:  Lactic acidosis, with normal procalcitonin, suspect related to increased work of breathing  Plan: Continue current management Follow blood cultures X X

## 2022-10-02 NOTE — ED Notes (Signed)
See triage note   Presents with some SOB and cough  States she is more SOB with exertion

## 2022-10-02 NOTE — Hospital Course (Addendum)
Ms. 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  Per ED documentation and outpatient on-call nurse documentation, patient had SpO2 saturations in the mid 80s with exertion on room air.  Vitals in the ED showed temperature of 98.7, respiration rate of 20, heart rate of 92, blood pressure 170/103, SpO2 of 93% on room air.  Serum sodium is 136, potassium 3.8, chloride 103, bicarb 25, BUN of 14, serum creatinine of 0.81, nonfasting blood glucose 218, WBC 7.7, hemoglobin 12.9, platelets of 202.  Chest x-ray 2 views: Read as hazy opacity at the left lung base could represent atelectasis or infection.  Possible trace right pleural effusion.  ED treatment: DuoNebs one-time treatment, Toradol 15 mg IV one-time dose, Solu-Medrol, cefepime, azithromycin 500 mg IV, sodium chloride 1 L bolus.

## 2022-10-02 NOTE — Assessment & Plan Note (Signed)
Home paroxetine 25 mg daily, as needed Xanax p.o. 3 times daily as needed for anxiety resumed

## 2022-10-02 NOTE — Assessment & Plan Note (Addendum)
Acute hypoxic respiratory failure Etiology workup in progress, differentials include asthma exacerbation versus community-acquired pneumonia, viral pneumonia Patient is status post cefepime and azithromycin IV one-time dose per EDP I will order a procalcitonin on admission, and if that is positive we will initiate with antibiotic for bacterial pneumonia coverage If procalcitonin is negative, unlikely to be bacterial pneumonia and IV antibiotic not indicated DuoNebs scheduled treatment 3 times daily, 3 doses ordered Patient may need to be evaluated for chronic versus as needed oxygen requirement Patient may also benefit from outpatient PFT per PCP/outpatient pulmonologist

## 2022-10-02 NOTE — Assessment & Plan Note (Signed)
-  CPAP nightly ordered 

## 2022-10-02 NOTE — ED Notes (Signed)
Pt ambulated to bathroom  O2 sat 86%  resp labored

## 2022-10-02 NOTE — ED Triage Notes (Signed)
Patient to ED via POV for SOB with cough and headache x2 days. Hx of asthma. Dyspnea with exertion noted.

## 2022-10-02 NOTE — ED Provider Notes (Signed)
[facility] Provider Note    Event Date/Time   First MD Initiated Contact with Patient 10/02/22 1028     (approximate)   History   Shortness of Breath   HPI  Kirsten [name] is a 67 y.o. female with history of T2DM, OSA, A-fib, asthma presenting to the emergency department for evaluation of shortness of breath.  Patient reports that for the last 2 days she has had a cough with associated shortness of breath, body aches, headache.  No known sick contacts.  No noted fevers.  Had worsening shortness of breath today with associated wheezing leading to ER presentation.    Physical Exam   Triage Vital Signs: ED Triage Vitals [10/02/22 0955]  Encounter Vitals Group     BP (!) 170/103     Systolic BP Percentile      Diastolic BP Percentile      Pulse Rate 92     Resp 20     Temp 98.7 F (37.1 C)     Temp Source Oral     SpO2 93 %     Weight 195 lb (88.5 kg)     Height 5\' 7"  (1.702 m)     Head Circumference      Peak Flow      Pain Score 0     Pain Loc      Pain Education      Exclude from Growth Chart     Most recent vital signs: Vitals:   10/02/22 0955  BP: (!) 170/103  Pulse: 92  Resp: 20  Temp: 98.7 F (37.1 C)  SpO2: 93%     General: Awake, interactive  CV:  Regular rate, good peripheral perfusion.  Resp:  Diminished lung sounds with expiratory wheezing in the bilateral lungs, mildly labored respirations, O2 sats in the low to mid 90s during my evaluation Abd:  Soft, nondistended.  Neuro:  Symmetric facial movement, fluid speech   ED Results / Procedures / Treatments   Labs (all labs ordered are listed, but only abnormal results are displayed) Labs Reviewed  BASIC METABOLIC PANEL - Abnormal; Notable for the following components:      Result Value   Glucose, Bld 218 (*)    All other components within normal limits  RESP PANEL BY RT-PCR (RSV, FLU A&B, COVID)  RVPGX2  CULTURE, BLOOD (ROUTINE X 2)  CULTURE, BLOOD (ROUTINE X 2)   CBC  LACTIC ACID, PLASMA  LACTIC ACID, PLASMA  HEPATIC FUNCTION PANEL     EKG EKG independently reviewed interpreted by myself (ER attending) demonstrates:  EKG demonstrates sinus rhythm at a rate of 88, PR 148, QRS 130, QTc 491, artifact present but no appreciable ST changes  RADIOLOGY Imaging independently reviewed and interpreted by myself demonstrates:  CXR with slight opacity of the left lung base, new compared to prior, per radiology reflective of possible atelectasis versus infection, radiology also notes a trace right pleural effusion  PROCEDURES:  Critical Care performed: Yes, see critical care procedure note(s)  CRITICAL CARE Performed by: Trinna Post   Total critical care time: 32 minutes  Critical care time was exclusive of separately billable procedures and treating other patients.  Critical care was necessary to treat or prevent imminent or life-threatening deterioration.  Critical care was time spent personally by me on the following activities: development of treatment plan with patient and/or surrogate as well as nursing, discussions with consultants, evaluation of patient's response to treatment, examination of patient, obtaining history  from patient or surrogate, ordering and performing treatments and interventions, ordering and review of laboratory studies, ordering and review of radiographic studies, pulse oximetry and re-evaluation of patient's condition.   Procedures   MEDICATIONS ORDERED IN ED: Medications  sodium chloride 0.9 % bolus 1,000 mL (has no administration in time range)  ceFEPIme (MAXIPIME) 2 g in sodium chloride 0.9 % 100 mL IVPB (has no administration in time range)  azithromycin (ZITHROMAX) 500 mg in sodium chloride 0.9 % 250 mL IVPB (has no administration in time range)  methylPREDNISolone sodium succinate (SOLU-MEDROL) 125 mg/2 mL injection 125 mg (has no administration in time range)  ketorolac (TORADOL) 15 MG/ML injection 15 mg (has no  administration in time range)  ipratropium-albuterol (DUONEB) 0.5-2.5 (3) MG/3ML nebulizer solution 6 mL (6 mLs Nebulization Given 10/02/22 1046)     IMPRESSION / MDM / ASSESSMENT AND PLAN / ED COURSE  I reviewed the triage vital signs and the nursing notes.  Differential diagnosis includes, but is not limited to, pneumonia, viral illness, asthma flare, anemia, electrolyte abnormality, presentation not consistent with ACS  Patient's presentation is most consistent with acute presentation with potential threat to life or bodily function.  67 year old female presenting to the emergency department for evaluation of shortness of breath.  Labs without severe derangement.  Patient does have some shortness of breath with wheezing on exam.  Will treat with DuoNebs and reevaluate.  Patient reevaluated after DuoNeb's.  Does have some improvement in her wheezing, but continues to feel short of breath with ongoing cough.  O2 sats were around 88% on reevaluation shortly after nebulizer treatment.  Will obtain an ambulatory O2 sat, but suspect that with her hypoxia and pneumonia, may require admission.  With walking O2 sat, patient with desaturations down to 86%, tachypnea, worsening shortness of breath.  X-Pao Haffey did result with possible left-sided pneumonia.  Does not currently meet sepsis criteria, but will send blood cultures and lactate.  Has a documented penicillin allergy, but I did review her ED course from 8/18 at which time she received cefepime which she tolerated without allergy.  Cefepime and azithromycin ordered.  Will also order IV steroids.  Will reach out to hospitalist team to discuss admission.  Case reviewed with Dr. Sedalia Muta.  She will evaluate the patient for anticipated admission.     FINAL CLINICAL IMPRESSION(S) / ED DIAGNOSES   Final diagnoses:  Community acquired pneumonia of left lower lobe of lung  Exacerbation of asthma, unspecified asthma severity, unspecified whether persistent      Rx / DC Orders   ED Discharge Orders     None        Note:  This document was prepared using Dragon voice recognition software and may include unintentional dictation errors.   Trinna Post, MD 10/02/22 [id number]

## 2022-10-02 NOTE — Progress Notes (Signed)
Pharmacy Antibiotic Note  Kirsten [name] is a 67 y.o. female admitted on 10/02/2022 with sepsis. PMH significant for asthma, HTN, T2DM, Afib (Eliquis PTA), HLD. Patient presented with worsening shortness of breath and cough over last two days. In ED, patient is afebrile with no leukocytosis. Pharmacy has been consulted for vancomycin and cefepime dosing.  Plan: Give vancomycin 1750 mg IV load x1 Start vancomycin 1250 mg IV every 24 hours (eAUC 468.6, Scr 0.81, Vd 0.5 L/kg) Start cefepime 2 g IV every 8 hours based on current renal function Monitor renal function, clinical status, and LOT F/u MRSA PCR and de-escalate antibiotics as clinically appropriate  Height: 5\' 7"  (170.2 cm) Weight: 88.5 kg (195 lb) IBW/kg (Calculated) : 61.6  Temp (24hrs), Avg:98.5 F (36.9 C), Min:98 F (36.7 C), Max:98.7 F (37.1 C)  Recent Labs  Lab 10/02/22 1014 10/02/22 1222 10/02/22 1808 10/02/22 1844  WBC 7.7  --   --   --   CREATININE 0.81  --   --   --   LATICACIDVEN  --  1.9 3.5* 3.5*    Estimated Creatinine Clearance: 79.1 mL/min (by C-G formula based on SCr of 0.81 mg/dL).    Allergies  Allergen Reactions   Doxycycline Rash   Factive [Gemifloxacin Mesylate] Rash   Crestor [Rosuvastatin]     GI upset   Sulfonamide Derivatives     REACTION: rash   Gemifloxacin Rash   Penicillins Hives and Rash    Has patient had a PCN reaction causing immediate rash, facial/tongue/throat swelling, SOB or lightheadedness with hypotension: No Has patient had a PCN reaction causing severe rash involving mucus membranes or skin necrosis: No Has patient had a PCN reaction that required hospitalization No Has patient had a PCN reaction occurring within the last 10 years: No If all of the above answers are "NO", then may proceed with Cephalosporin use.     Antimicrobials this admission: vancomycin 8/20 >>  Cefepime 8/20 >>  Azithromycin 8/20 >>  Dose adjustments this admission: N/A  Microbiology  results: 8/20 BCx: pending 8/20 MRSA PCR: pending  Thank you for involving pharmacy in this patient's care.   Rockwell Alexandria, PharmD Clinical Pharmacist 10/02/2022 8:08 PM

## 2022-10-02 NOTE — Telephone Encounter (Addendum)
Dry Cough, wheezing, headache and increase SOB.  Symptoms started yesterday.  O2 Sats are low to mid 80s with exertion. Does not wear O2 anymore. 88% while on the phone. Fever last night and chills Has not tested for Covid and does not have a way to test.   Medications.. Albuterol- TID Breo- 1 puff daily  I did tell her she will probably need to be seen at a Urgent Care or ED, but I told her I would send the message to you and ask.

## 2022-10-02 NOTE — Assessment & Plan Note (Signed)
Home paroxetine 25 mg daily and alprazolam 0.25 mg p.o. 3 times daily as needed for anxiety resumed

## 2022-10-02 NOTE — Assessment & Plan Note (Addendum)
Home maintenance inhaler Breo Ellipta 1 puff daily resumed DuoNebs scheduled 3 doses ordered on admission Azithromycin 500 mg IV daily, 4 additional doses ordered for anti-inflammatory benefits

## 2022-10-02 NOTE — Assessment & Plan Note (Signed)
Home levothyroxine 175 mcg daily before breakfast resumed

## 2022-10-02 NOTE — Assessment & Plan Note (Signed)
Home metoprolol succinate 12.5 mg daily, losartan 25 mg daily, furosemide 20 mg daily, were resumed on admission Hydralazine 10 mg p.o. every 6 hours as needed for SBP greater than 165, 4 days of coverage ordered

## 2022-10-02 NOTE — Assessment & Plan Note (Signed)
Non-insulin-dependent diabetes mellitus Patient's last A1c on 09/04/2022 was 7.2, patient would benefit from outpatient follow-up and initiation of maintenance and as needed insulin at home Home metformin not on admission Insulin SSI with at bedtime coverage, steroid dosing ordered

## 2022-10-02 NOTE — Assessment & Plan Note (Addendum)
Home apixaban 5 mg p.o. twice daily resumed Amiodarone 200 mg daily resumed

## 2022-10-02 NOTE — Progress Notes (Addendum)
[facility] Progress Note  Received message from nursing that patient's lactic acid is 3.5  # Elevated lactic acid with increased respiration rate - Etiology/source workup in progress - Blood cultures x 2 are in progress - Will check a UA - Initiate cefepime and vancomycin per pharmacy - Order CT scan of the chest with contrast - Sodium chloride 1 L bolus, judicious use of fluid given patient's recent history of stress-induced cardiomyopathy with reduced ejection fraction - Sodium chloride infusion at 150 mL/h - Recheck lactic acid type for 2100 hrs. - Discussed with nursing the above and cross coverage provider aware  Dr. Sedalia Muta

## 2022-10-02 NOTE — Assessment & Plan Note (Signed)
-   Atorvastatin 40 mg nightly resumed 

## 2022-10-03 DIAGNOSIS — E039 Hypothyroidism, unspecified: Secondary | ICD-10-CM | POA: Diagnosis not present

## 2022-10-03 DIAGNOSIS — Z79890 Hormone replacement therapy (postmenopausal): Secondary | ICD-10-CM | POA: Diagnosis not present

## 2022-10-03 DIAGNOSIS — F32A Depression, unspecified: Secondary | ICD-10-CM | POA: Diagnosis not present

## 2022-10-03 DIAGNOSIS — K7581 Nonalcoholic steatohepatitis (NASH): Secondary | ICD-10-CM | POA: Diagnosis not present

## 2022-10-03 DIAGNOSIS — I2510 Atherosclerotic heart disease of native coronary artery without angina pectoris: Secondary | ICD-10-CM | POA: Diagnosis not present

## 2022-10-03 DIAGNOSIS — A419 Sepsis, unspecified organism: Secondary | ICD-10-CM | POA: Diagnosis not present

## 2022-10-03 DIAGNOSIS — R6520 Severe sepsis without septic shock: Secondary | ICD-10-CM | POA: Diagnosis not present

## 2022-10-03 DIAGNOSIS — J9601 Acute respiratory failure with hypoxia: Secondary | ICD-10-CM | POA: Diagnosis not present

## 2022-10-03 DIAGNOSIS — J181 Lobar pneumonia, unspecified organism: Secondary | ICD-10-CM | POA: Diagnosis not present

## 2022-10-03 DIAGNOSIS — R059 Cough, unspecified: Secondary | ICD-10-CM | POA: Diagnosis not present

## 2022-10-03 DIAGNOSIS — I110 Hypertensive heart disease with heart failure: Secondary | ICD-10-CM | POA: Diagnosis not present

## 2022-10-03 DIAGNOSIS — Z7901 Long term (current) use of anticoagulants: Secondary | ICD-10-CM | POA: Diagnosis not present

## 2022-10-03 DIAGNOSIS — I480 Paroxysmal atrial fibrillation: Secondary | ICD-10-CM | POA: Diagnosis not present

## 2022-10-03 DIAGNOSIS — J9621 Acute and chronic respiratory failure with hypoxia: Secondary | ICD-10-CM | POA: Diagnosis not present

## 2022-10-03 DIAGNOSIS — E785 Hyperlipidemia, unspecified: Secondary | ICD-10-CM | POA: Diagnosis not present

## 2022-10-03 DIAGNOSIS — E1165 Type 2 diabetes mellitus with hyperglycemia: Secondary | ICD-10-CM | POA: Diagnosis not present

## 2022-10-03 DIAGNOSIS — I2721 Secondary pulmonary arterial hypertension: Secondary | ICD-10-CM | POA: Diagnosis not present

## 2022-10-03 DIAGNOSIS — Z7984 Long term (current) use of oral hypoglycemic drugs: Secondary | ICD-10-CM | POA: Diagnosis not present

## 2022-10-03 DIAGNOSIS — J4530 Mild persistent asthma, uncomplicated: Secondary | ICD-10-CM | POA: Diagnosis not present

## 2022-10-03 DIAGNOSIS — E872 Acidosis, unspecified: Secondary | ICD-10-CM | POA: Diagnosis not present

## 2022-10-03 DIAGNOSIS — R918 Other nonspecific abnormal finding of lung field: Secondary | ICD-10-CM | POA: Diagnosis not present

## 2022-10-03 DIAGNOSIS — R0602 Shortness of breath: Secondary | ICD-10-CM | POA: Diagnosis not present

## 2022-10-03 DIAGNOSIS — J189 Pneumonia, unspecified organism: Secondary | ICD-10-CM | POA: Diagnosis not present

## 2022-10-03 DIAGNOSIS — I517 Cardiomegaly: Secondary | ICD-10-CM | POA: Diagnosis not present

## 2022-10-03 DIAGNOSIS — F411 Generalized anxiety disorder: Secondary | ICD-10-CM | POA: Diagnosis not present

## 2022-10-03 DIAGNOSIS — Z79899 Other long term (current) drug therapy: Secondary | ICD-10-CM | POA: Diagnosis not present

## 2022-10-03 DIAGNOSIS — Z1152 Encounter for screening for COVID-19: Secondary | ICD-10-CM | POA: Diagnosis not present

## 2022-10-03 DIAGNOSIS — I5033 Acute on chronic diastolic (congestive) heart failure: Secondary | ICD-10-CM | POA: Diagnosis not present

## 2022-10-03 DIAGNOSIS — J9611 Chronic respiratory failure with hypoxia: Secondary | ICD-10-CM | POA: Diagnosis not present

## 2022-10-03 DIAGNOSIS — J8410 Pulmonary fibrosis, unspecified: Secondary | ICD-10-CM | POA: Diagnosis not present

## 2022-10-03 DIAGNOSIS — G4733 Obstructive sleep apnea (adult) (pediatric): Secondary | ICD-10-CM | POA: Diagnosis not present

## 2022-10-03 DIAGNOSIS — J4541 Moderate persistent asthma with (acute) exacerbation: Secondary | ICD-10-CM | POA: Diagnosis not present

## 2022-10-03 LAB — CBC
HCT: 39.1 % (ref 36.0–46.0)
Hemoglobin: 12.3 g/dL (ref 12.0–15.0)
MCH: 29.2 pg (ref 26.0–34.0)
MCHC: 31.5 g/dL (ref 30.0–36.0)
MCV: 92.9 fL (ref 80.0–100.0)
Platelets: 187 10*3/uL (ref 150–400)
RBC: 4.21 MIL/uL (ref 3.87–5.11)
RDW: 13.9 % (ref 11.5–15.5)
WBC: 5.9 10*3/uL (ref 4.0–10.5)
nRBC: 0 % (ref 0.0–0.2)

## 2022-10-03 LAB — URINALYSIS, W/ REFLEX TO CULTURE (INFECTION SUSPECTED)
Bilirubin Urine: NEGATIVE
Glucose, UA: 150 mg/dL — AB
Hgb urine dipstick: NEGATIVE
Ketones, ur: NEGATIVE mg/dL
Nitrite: NEGATIVE
Protein, ur: NEGATIVE mg/dL
Specific Gravity, Urine: 1.004 — ABNORMAL LOW (ref 1.005–1.030)
Squamous Epithelial / HPF: NONE SEEN /HPF (ref 0–5)
pH: 5 (ref 5.0–8.0)

## 2022-10-03 LAB — BASIC METABOLIC PANEL
Anion gap: 4 — ABNORMAL LOW (ref 5–15)
BUN: 12 mg/dL (ref 8–23)
CO2: 25 mmol/L (ref 22–32)
Calcium: 9.1 mg/dL (ref 8.9–10.3)
Chloride: 112 mmol/L — ABNORMAL HIGH (ref 98–111)
Creatinine, Ser: 0.67 mg/dL (ref 0.44–1.00)
GFR, Estimated: >60 mL/min (ref >60)
Glucose, Bld: 216 mg/dL — ABNORMAL HIGH (ref 70–99)
Potassium: 4.3 mmol/L (ref 3.5–5.1)
Sodium: 141 mmol/L (ref 135–145)

## 2022-10-03 LAB — GLUCOSE, CAPILLARY
Glucose-Capillary: 218 mg/dL — ABNORMAL HIGH (ref 70–99)
Glucose-Capillary: 192 mg/dL — ABNORMAL HIGH (ref 70–99)
Glucose-Capillary: 298 mg/dL — ABNORMAL HIGH (ref 70–99)
Glucose-Capillary: 213 mg/dL — ABNORMAL HIGH (ref 70–99)

## 2022-10-03 LAB — LACTIC ACID, PLASMA: Lactic Acid, Venous: 4.3 mmol/L (ref 0.5–1.9)

## 2022-10-03 MED ORDER — IBUPROFEN 400 MG PO TABS
400.0000 mg | ORAL_TABLET | Freq: Once | ORAL | Status: AC
  Administered 2022-10-03: 400 mg via ORAL
  Filled 2022-10-03: qty 1

## 2022-10-03 MED ORDER — SODIUM CHLORIDE 0.9 % IV SOLN
1.0000 g | INTRAVENOUS | Status: AC
  Administered 2022-10-03 – 2022-10-07 (×5): 1 g via INTRAVENOUS
  Filled 2022-10-03 (×5): qty 10

## 2022-10-03 MED ORDER — IPRATROPIUM-ALBUTEROL 0.5-2.5 (3) MG/3ML IN SOLN
3.0000 mL | Freq: Four times a day (QID) | RESPIRATORY_TRACT | Status: DC | PRN
  Administered 2022-10-03 – 2022-10-05 (×3): 3 mL via RESPIRATORY_TRACT
  Filled 2022-10-03 (×3): qty 3

## 2022-10-03 NOTE — Progress Notes (Signed)
[facility]  - [place] [facility]   PATIENT [name]    MR#:  [id number]  DATE OF BIRTH:  06/10/1955  SUBJECTIVE:  no family at bedside. Patient came in with increasing shortness of breath and cough which is nonproductive. She had fever at home. Complains of incontinence and frequency. Denies any back pain or dysuria. Has had multiple UTIs and recently finished course of antibiotic. Requesting urinalysis. Overall feels better however does have some lingering headache. Patient's workup showed pneumonia. She is currently getting IV antibiotics.    VITALS:  Blood pressure (!) 143/66, pulse 80, temperature 98.1 F (36.7 C), temperature source Oral, resp. rate 19, height 5\' 7"  (1.702 m), weight 88.5 kg, SpO2 98%.  PHYSICAL EXAMINATION:   GENERAL:  67 y.o.-year-old patient with no acute distress. Obese LUNGS: decreased breath sounds bilaterally, no wheezing CARDIOVASCULAR: S1, S2 normal. No murmur   ABDOMEN: Soft, nontender, nondistended. Bowel sounds present.  EXTREMITIES: No  edema b/l.    NEUROLOGIC: nonfocal  patient is alert and awake SKIN: No obvious rash, lesion, or ulcer.   LABORATORY PANEL:  CBC Recent Labs  Lab 10/03/22 0528  WBC 5.9  HGB 12.3  HCT 39.1  PLT 187    Chemistries  Recent Labs  Lab 10/02/22 1222 10/03/22 0528  NA  --  141  K  --  4.3  CL  --  112*  CO2  --  25  GLUCOSE  --  216*  BUN  --  12  CREATININE  --  0.67  CALCIUM  --  9.1  AST 26  --   ALT 25  --   ALKPHOS 62  --   BILITOT 0.7  --     RADIOLOGY:  CT CHEST W CONTRAST  Result Date: 10/02/2022 CLINICAL DATA:  Chronic dyspnea, COPD, abnormal chest x-ray EXAM: CT CHEST WITH CONTRAST TECHNIQUE: Multidetector CT imaging of the chest was performed during intravenous contrast administration. RADIATION DOSE REDUCTION: This exam was performed according to the departmental dose-optimization program which includes automated exposure control, adjustment of the mA  and/or kV according to patient size and/or use of iterative reconstruction technique. CONTRAST:  75mL OMNIPAQUE IOHEXOL 300 MG/ML  SOLN COMPARISON:  04/17/2022, concurrently performed chest radiograph FINDINGS: Cardiovascular: There is extensive left anterior descending coronary artery calcification. Stable cardiomegaly. Stable mediastinal shift to the right with mild dextrorotation of the heart. No pericardial effusion. Central pulmonary arteries are stably enlarged in keeping with changes of pulmonary arterial hypertension. The thoracic aorta is of normal caliber. Mild atherosclerotic calcification within the thoracic aorta. Persistent left-sided superior vena cava noted with drainage via the hemi azygous-azygous system to the superior vena cava. Mediastinum/Nodes: No enlarged mediastinal, hilar, or axillary lymph nodes. Thyroid gland, trachea, and esophagus demonstrate no significant findings. Lungs/Pleura: There is focal consolidation within the posterior basal segment of the left lower lobe in keeping with changes of acute lobar pneumonia in the appropriate clinical setting. Small left parapneumonic effusion. Minimal infiltrate within the left upper lobe. Posterior right diaphragmatic hernia containing retroperitoneal fat again noted. Mild right-sided volume loss with right basilar atelectasis again noted. No pneumothorax. No pleural effusion on the right. No central obstructing lesion. Upper Abdomen: No acute abnormality. Musculoskeletal: No acute bone abnormality. Multiple healed left rib fractures noted. No acute bone abnormality. IMPRESSION: 1. Focal consolidation within the posterior basal segment of the left lower lobe in keeping with changes of acute lobar pneumonia in the appropriate clinical setting. Small left parapneumonic effusion.  2. Stable cardiomegaly. Extensive left anterior descending coronary artery calcification. 3. Stable changes of pulmonary arterial hypertension. 4. Stable posterior right  diaphragmatic hernia containing retroperitoneal fat. Aortic Atherosclerosis (ICD10-I70.0). Electronically Signed   By: Helyn Numbers M.D.   On: 10/02/2022 23:56   DG Chest 2 View  Result Date: 10/02/2022 CLINICAL DATA:  COPD EXAM: CHEST - 2 VIEW COMPARISON:  08/20/22 CXR FINDINGS: Cardiomegaly. Possible trace right pleural effusion. No pneumothorax. Hazy opacity at the left lung base could represent atelectasis or infection. No radiographically apparent displaced rib fractures. Visualized upper abdomen unremarkable. IMPRESSION: 1. Hazy opacity at the left lung base could represent atelectasis or infection. 2. Possible trace right pleural effusion. Electronically Signed   By: Lorenza Cambridge M.D.   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   chest x-ray 2 views:  hazy opacity at the left lung base could represent atelectasis or infection.  Possible trace right pleural effusion.   Left lower lobe pneumonia community acquired Acidosis -- came in with shortness of breath and fever at home. No fever documented here. -- CT chest showed left lower lobe pneumonia. -- Continue IV Rocephin and azithromycin -afebrile. White count normal. Lactic acid decreasing. -- Vitals stable. -- Wean oxygen to room air if able to  History of asthma moderate persistent -- not on chronic oxygen at home -- will assess for home oxygen -- PRN nebs and inhalers -- patient [facility] with [facility]  Morbid obesity, sleep apnea -- continue CPAP  Hypertension -- on metoprolol and losartan  A fib-paroxysmal -- heart rate control continue  beta-blockers and eliquis -- patient also on amiodarone  Type II diabetes with hyperglycemia in the setting of steroids Hyperlipidemia -- statins -- A1c in July were 7.2 -- continue sliding scale. Patient takes metformin at home will resume at discharge  Depression anxiety --continue Paxil and as needed Xanax   Procedures: Family communication : none Consults : none CODE STATUS: full DVT Prophylaxis : eliquis Level of care: Telemetry Medical Status is: Inpatient Remains inpatient appropriate because: pneumonia    Patient encouraged to ambulate around and out of bed to chair at least 3 to 4 times a day  TOTAL TIME TAKING CARE OF THIS PATIENT: 35 minutes.  >50% time spent on counselling and coordination of care  Note: This dictation was prepared with Dragon dictation along with smaller phrase technology. Any transcriptional errors that result from this process are unintentional.  Enedina Finner M.D    [facility]   CC: Primary care physician; Etta Grandchild, MD

## 2022-10-03 NOTE — TOC CM/SW Note (Signed)
Transition of Care [facility]) - Inpatient Brief Assessment   Patient Details  [name] MRN: [id number] Date of Birth: 20-Oct-1955  Transition of Care [facility]) CM/SW Contact:    Allena Katz, LCSW Phone Number: 10/03/2022, 11:22 AM   Clinical Narrative:    Transition of Care Asessment: Insurance and Status: Insurance coverage has been reviewed Patient has primary care physician: Yes Home environment has been reviewed: [address] [place]  [id number] Prior level of function:: PT/OT not ordered for patient, Prior/Current Home Services: No current home services Social Determinants of Health Reivew: SDOH reviewed no interventions necessary Readmission risk has been reviewed: Yes Transition of care needs: no transition of care needs at this time

## 2022-10-03 NOTE — Plan of Care (Signed)
  Problem: Education: Goal: Ability to describe self-care measures that may prevent or decrease complications (Diabetes Survival Skills Education) will improve Outcome: Progressing   Problem: Coping: Goal: Ability to adjust to condition or change in health will improve Outcome: Progressing   Problem: Health Behavior/Discharge Planning: Goal: Ability to identify and utilize available resources and services will improve Outcome: Progressing   

## 2022-10-04 DIAGNOSIS — R0602 Shortness of breath: Secondary | ICD-10-CM | POA: Diagnosis not present

## 2022-10-04 LAB — GLUCOSE, CAPILLARY
Glucose-Capillary: 202 mg/dL — ABNORMAL HIGH (ref 70–99)
Glucose-Capillary: 88 mg/dL (ref 70–99)
Glucose-Capillary: 126 mg/dL — ABNORMAL HIGH (ref 70–99)
Glucose-Capillary: 197 mg/dL — ABNORMAL HIGH (ref 70–99)

## 2022-10-04 LAB — LACTIC ACID, PLASMA: Lactic Acid, Venous: 2.1 mmol/L (ref 0.5–1.9)

## 2022-10-04 MED ORDER — PREDNISONE 50 MG PO TABS
50.0000 mg | ORAL_TABLET | Freq: Every day | ORAL | Status: DC
  Administered 2022-10-04 – 2022-10-05 (×2): 50 mg via ORAL
  Filled 2022-10-04 (×2): qty 1

## 2022-10-04 MED ORDER — BUTALBITAL-APAP-CAFFEINE 50-325-40 MG PO TABS: 1.0000 | ORAL_TABLET | Freq: Once | ORAL | Status: DC

## 2022-10-04 MED ORDER — METFORMIN HCL ER 500 MG PO TB24
500.0000 mg | ORAL_TABLET | Freq: Two times a day (BID) | ORAL | Status: DC
  Administered 2022-10-04 – 2022-10-06 (×4): 500 mg via ORAL
  Filled 2022-10-04 (×5): qty 1

## 2022-10-04 MED ORDER — BUTALBITAL-APAP-CAFFEINE 50-325-40 MG PO TABS
2.0000 | ORAL_TABLET | Freq: Once | ORAL | Status: AC
  Administered 2022-10-04: 2 via ORAL
  Filled 2022-10-04: qty 2

## 2022-10-04 MED ORDER — ALBUTEROL SULFATE (2.5 MG/3ML) 0.083% IN NEBU: 3.0000 mL | INHALATION_SOLUTION | RESPIRATORY_TRACT | Status: DC | PRN

## 2022-10-04 MED ORDER — ALBUTEROL SULFATE HFA 108 (90 BASE) MCG/ACT IN AERS
1.0000 | INHALATION_SPRAY | Freq: Four times a day (QID) | RESPIRATORY_TRACT | Status: DC | PRN
  Administered 2022-10-05 – 2022-10-06 (×2): 1 via RESPIRATORY_TRACT
  Filled 2022-10-04: qty 6.7

## 2022-10-04 NOTE — Progress Notes (Signed)
Pt was ambulated on RA and pt dropped to 83% on RA after about 65feet. Pt was very short of breath and wheezing. Allena Katz, MD notified. Pt given Duo neb and O2 returned to 99% AFTER 1 MINUTE.

## 2022-10-04 NOTE — Progress Notes (Signed)
[facility]  - [place] at [facility]   PATIENT [name]    MR#:  [id number]  DATE OF BIRTH:  1955/10/12  SUBJECTIVE:  patient's husband at bedside.  Patient still has headache. She told me a few orders that helped her two days ago. Started to produce some phlegm. Some wheezing earlier got breathing treatment. Tolerating PO diet. No fever.   VITALS:  Blood pressure 136/62, pulse 71, temperature 97.7 F (36.5 C), resp. rate 17, height 5\' 7"  (1.702 m), weight 88.5 kg, SpO2 98%.  PHYSICAL EXAMINATION:   GENERAL:  67 y.o.-year-old patient with no acute distress. Obese LUNGS: decreased breath sounds bilaterally, no wheezing CARDIOVASCULAR: S1, S2 normal. No murmur   ABDOMEN: Soft, nontender, nondistended. Bowel sounds present.  EXTREMITIES: No  edema b/l.    NEUROLOGIC: nonfocal  patient is alert and awake SKIN: No obvious rash, lesion, or ulcer.   LABORATORY PANEL:  CBC Recent Labs  Lab 10/03/22 0528  WBC 5.9  HGB 12.3  HCT 39.1  PLT 187    Chemistries  Recent Labs  Lab 10/02/22 1222 10/03/22 0528  NA  --  141  K  --  4.3  CL  --  112*  CO2  --  25  GLUCOSE  --  216*  BUN  --  12  CREATININE  --  0.67  CALCIUM  --  9.1  AST 26  --   ALT 25  --   ALKPHOS 62  --   BILITOT 0.7  --     RADIOLOGY:  CT CHEST W CONTRAST  Result Date: 10/02/2022 CLINICAL DATA:  Chronic dyspnea, COPD, abnormal chest x-ray EXAM: CT CHEST WITH CONTRAST TECHNIQUE: Multidetector CT imaging of the chest was performed during intravenous contrast administration. RADIATION DOSE REDUCTION: This exam was performed according to the departmental dose-optimization program which includes automated exposure control, adjustment of the mA and/or kV according to patient size and/or use of iterative reconstruction technique. CONTRAST:  75mL OMNIPAQUE IOHEXOL 300 MG/ML  SOLN COMPARISON:  04/17/2022, concurrently performed chest radiograph FINDINGS: Cardiovascular: There is  extensive left anterior descending coronary artery calcification. Stable cardiomegaly. Stable mediastinal shift to the right with mild dextrorotation of the heart. No pericardial effusion. Central pulmonary arteries are stably enlarged in keeping with changes of pulmonary arterial hypertension. The thoracic aorta is of normal caliber. Mild atherosclerotic calcification within the thoracic aorta. Persistent left-sided superior vena cava noted with drainage via the hemi azygous-azygous system to the superior vena cava. Mediastinum/Nodes: No enlarged mediastinal, hilar, or axillary lymph nodes. Thyroid gland, trachea, and esophagus demonstrate no significant findings. Lungs/Pleura: There is focal consolidation within the posterior basal segment of the left lower lobe in keeping with changes of acute lobar pneumonia in the appropriate clinical setting. Small left parapneumonic effusion. Minimal infiltrate within the left upper lobe. Posterior right diaphragmatic hernia containing retroperitoneal fat again noted. Mild right-sided volume loss with right basilar atelectasis again noted. No pneumothorax. No pleural effusion on the right. No central obstructing lesion. Upper Abdomen: No acute abnormality. Musculoskeletal: No acute bone abnormality. Multiple healed left rib fractures noted. No acute bone abnormality. IMPRESSION: 1. Focal consolidation within the posterior basal segment of the left lower lobe in keeping with changes of acute lobar pneumonia in the appropriate clinical setting. Small left parapneumonic effusion. 2. Stable cardiomegaly. Extensive left anterior descending coronary artery calcification. 3. Stable changes of pulmonary arterial hypertension. 4. Stable posterior right diaphragmatic hernia containing retroperitoneal fat. Aortic Atherosclerosis (ICD10-I70.0). Electronically Signed  By: Helyn Numbers M.D.   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   chest x-ray 2 views:  hazy opacity at the left lung base could represent atelectasis or infection.  Possible trace right pleural effusion.   Left lower lobe pneumonia community acquired Acidosis -- came in with shortness of breath and fever at home. No fever documented here. -- CT chest showed left lower lobe pneumonia. -- Continue IV Rocephin and azithromycin -afebrile. White count normal. Lactic acid decreasing down to 2.1 -- Vitals stable. -- Wean oxygen to room air if able to  History of asthma moderate persistent -- not on chronic oxygen at home -- will assess for home oxygen -- PRN nebs and inhalers -- patient follows with [name]  Morbid obesity, sleep apnea -- continue CPAP  Hypertension -- on metoprolol and losartan  A fib-paroxysmal -- heart rate control continue beta-blockers and eliquis -- patient also on amiodarone  Type II diabetes with hyperglycemia in the setting of steroids Hyperlipidemia -- statins -- A1c in July was 7.2 -- continue sliding scale. Patient takes metformin at home will resume at discharge  Depression anxiety --continue Paxil and as needed Xanax  Headache --fioricet x 1   Family communication : none Consults : none CODE STATUS: full DVT Prophylaxis : eliquis Level of care: Telemetry Medical Status is: Inpatient Remains inpatient appropriate because: pneumonia    Patient encouraged to ambulate around and out of bed to chair at least 3 to 4 times a day  TOTAL TIME TAKING CARE OF THIS PATIENT: 35 minutes.  >50% time spent on counselling and coordination of care  Note: This dictation was  prepared with Dragon dictation along with smaller phrase technology. Any transcriptional errors that result from this process are unintentional.  Enedina Finner M.D    [facility]   CC: Primary care physician; Kirsten Grandchild, MD

## 2022-10-04 NOTE — Plan of Care (Signed)
  Problem: Education: Goal: Ability to describe self-care measures that may prevent or decrease complications (Diabetes Survival Skills Education) will improve Outcome: Progressing   Problem: Coping: Goal: Ability to adjust to condition or change in health will improve Outcome: Progressing   Problem: Metabolic: Goal: Ability to maintain appropriate glucose levels will improve Outcome: Progressing   Problem: Nutritional: Goal: Progress toward achieving an optimal weight will improve Outcome: Progressing

## 2022-10-05 ENCOUNTER — Inpatient Hospital Stay: Payer: PPO

## 2022-10-05 DIAGNOSIS — I480 Paroxysmal atrial fibrillation: Secondary | ICD-10-CM

## 2022-10-05 DIAGNOSIS — J9601 Acute respiratory failure with hypoxia: Secondary | ICD-10-CM | POA: Diagnosis not present

## 2022-10-05 DIAGNOSIS — J181 Lobar pneumonia, unspecified organism: Secondary | ICD-10-CM

## 2022-10-05 DIAGNOSIS — J4530 Mild persistent asthma, uncomplicated: Secondary | ICD-10-CM | POA: Diagnosis not present

## 2022-10-05 DIAGNOSIS — G4733 Obstructive sleep apnea (adult) (pediatric): Secondary | ICD-10-CM | POA: Diagnosis not present

## 2022-10-05 LAB — GLUCOSE, CAPILLARY
Glucose-Capillary: 184 mg/dL — ABNORMAL HIGH (ref 70–99)
Glucose-Capillary: 142 mg/dL — ABNORMAL HIGH (ref 70–99)
Glucose-Capillary: 213 mg/dL — ABNORMAL HIGH (ref 70–99)
Glucose-Capillary: 212 mg/dL — ABNORMAL HIGH (ref 70–99)

## 2022-10-05 LAB — BRAIN NATRIURETIC PEPTIDE: B Natriuretic Peptide: 153.2 pg/mL — ABNORMAL HIGH (ref 0.0–100.0)

## 2022-10-05 MED ORDER — BUTALBITAL-APAP-CAFFEINE 50-325-40 MG PO TABS
2.0000 | ORAL_TABLET | Freq: Every day | ORAL | Status: DC | PRN
  Administered 2022-10-05: 2 via ORAL
  Filled 2022-10-05: qty 2

## 2022-10-05 MED ORDER — BUTALBITAL-APAP-CAFFEINE 50-325-40 MG PO TABS
1.0000 | ORAL_TABLET | Freq: Four times a day (QID) | ORAL | Status: DC | PRN
  Administered 2022-10-05: 1 via ORAL
  Filled 2022-10-05: qty 1

## 2022-10-05 MED ORDER — PREDNISONE 20 MG PO TABS
40.0000 mg | ORAL_TABLET | Freq: Every day | ORAL | Status: DC
  Administered 2022-10-06 – 2022-10-07 (×2): 40 mg via ORAL
  Filled 2022-10-05 (×2): qty 2

## 2022-10-05 MED ORDER — BUTALBITAL-APAP-CAFFEINE 50-325-40 MG PO TABS: 1.0000 | ORAL_TABLET | Freq: Every day | ORAL | Status: DC | PRN

## 2022-10-05 MED ORDER — BUTALBITAL-APAP-CAFFEINE 50-325-40 MG PO TABS
2.0000 | ORAL_TABLET | Freq: Four times a day (QID) | ORAL | Status: DC | PRN
  Administered 2022-10-06 – 2022-10-07 (×3): 2 via ORAL
  Filled 2022-10-05 (×3): qty 2

## 2022-10-05 NOTE — TOC Progression Note (Signed)
Transition of Care [facility]) - Progression Note    Patient Details  [name] MRN: [id number] Date of Birth: 1955/03/27  [facility]) CM/SW Contact  Garret Reddish, RN Phone Number: 10/05/2022, 11:16 AM  Clinical Narrative:   Chart reviewed.  Patient admitted with Shortness of breath.  Patient being treated for PNA.  Patient is currently on IV Rocephin and IV Azithromycin.  Patient remains on 02 at 2.5L per [place].  Patient has been up out of bed an able to ambulate 180 feet with mobility tech.    TOC will continue to follow for discharge planning.                                               Social Determinants of Health (SDOH) Interventions SDOH Screenings   Food Insecurity: No Food Insecurity (10/02/2022)  Housing: Low Risk  (10/02/2022)  Transportation Needs: No Transportation Needs (10/02/2022)  Utilities: Not At Risk (10/02/2022)  Alcohol Screen: Low Risk  (09/03/2022)  Depression (PHQ2-9): Low Risk  (09/03/2022)  Financial Resource Strain: Low Risk  (09/03/2022)  Physical Activity: Insufficiently Active (09/03/2022)  Social Connections: Moderately Integrated (09/03/2022)  Stress: No Stress Concern Present (09/03/2022)  Tobacco Use: Low Risk  (10/02/2022)  Health Literacy: Adequate Health Literacy (09/03/2022)    Readmission Risk Interventions     No data to display

## 2022-10-05 NOTE — TOC Progression Note (Signed)
Transition of Care [facility]) - Progression Note    Patient Details  [name] MRN: [id number] Date of Birth: [birth year]  Transition of Care [facility]) CM/SW Contact  Garret Reddish, RN Phone Number: 10/05/2022, 3:40 PM  Clinical Narrative:   Chart reviewed.  Noted that patient will need home 02 and home nebulizer machine.  I have asked John with [employer] to provide patient will home 02 and home nebulizer machine.  Patient will be a tentative discharge for tomorrow.    TOC will continue to follow for discharge planning.          Expected Discharge Plan and Services                                               Social Determinants of Health (SDOH) Interventions SDOH Screenings   Food Insecurity: No Food Insecurity (10/02/2022)  Housing: Low Risk  (10/02/2022)  Transportation Needs: No Transportation Needs (10/02/2022)  Utilities: Not At Risk (10/02/2022)  Alcohol Screen: Low Risk  (09/03/2022)  Depression (PHQ2-9): Low Risk  (09/03/2022)  Financial Resource Strain: Low Risk  (09/03/2022)  Physical Activity: Insufficiently Active (09/03/2022)  Social Connections: Moderately Integrated (09/03/2022)  Stress: No Stress Concern Present (09/03/2022)  Tobacco Use: Low Risk  (10/02/2022)  Health Literacy: Adequate Health Literacy (09/03/2022)    Readmission Risk Interventions     No data to display

## 2022-10-05 NOTE — Care Management Important Message (Signed)
Important Message  Patient Details  [name] MRN: [id number] Date of Birth: 1955-05-18   Medicare Important Message Given:  N/A - LOS <3 / Initial given by admissions     Olegario Messier A Lynn Sissel 10/05/2022, 9:01 AM

## 2022-10-05 NOTE — Progress Notes (Signed)
[facility]  - [place] at [facility]   PATIENT [name]    MR#:  [id number]  DATE OF BIRTH:  1955/05/18  SUBJECTIVE:  patient's husband at bedside.  Feels a bit better. Light headache. Cough dry. Tolerating PO diet. Ambulated with staff yesterday and today. Oxygen drops down in the 80s. Patient will qualify for home oxygen.  VITALS:  Blood pressure (!) 127/114, pulse 73, temperature 98.2 F (36.8 C), resp. rate 18, height 5\' 7"  (1.702 m), weight 88.5 kg, SpO2 97%.  PHYSICAL EXAMINATION:   GENERAL:  67 y.o.-year-old patient with no acute distress. Obese LUNGS: decreased breath sounds bilaterally, no wheezing CARDIOVASCULAR: S1, S2 normal. No murmur   ABDOMEN: Soft, nontender, nondistended. Bowel sounds present.  EXTREMITIES: No  edema b/l.    NEUROLOGIC: nonfocal  patient is alert and awake SKIN: No obvious rash, lesion, or ulcer.   LABORATORY PANEL:  CBC Recent Labs  Lab 10/03/22 0528  WBC 5.9  HGB 12.3  HCT 39.1  PLT 187    Chemistries  Recent Labs  Lab 10/02/22 1222 10/03/22 0528  NA  --  141  K  --  4.3  CL  --  112*  CO2  --  25  GLUCOSE  --  216*  BUN  --  12  CREATININE  --  0.67  CALCIUM  --  9.1  AST 26  --   ALT 25  --   ALKPHOS 62  --   BILITOT 0.7  --     RADIOLOGY:  DG Chest Port 1 View  Result Date: 10/05/2022 CLINICAL DATA:  Cough. EXAM: PORTABLE CHEST 1 VIEW COMPARISON:  October 02, 2022. FINDINGS: Stable cardiomegaly. Bibasilar edema or atelectasis is noted. Probable small bilateral pleural effusions as well. Bony thorax is unremarkable. IMPRESSION: Mild bibasilar opacities are noted concerning for subsegmental atelectasis or edema. Probable small pleural effusions. Electronically Signed   By: Lupita Raider M.D.   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   chest x-ray 2 views:  hazy opacity at the left lung base could represent atelectasis or infection.  Possible trace right pleural effusion.   Left lower lobe pneumonia community acquired Acidosis -- came in with shortness of breath and fever at home. No fever documented here. -- CT chest showed left lower lobe pneumonia. -- Continue IV Rocephin and azithromycin -afebrile. White count normal. Lactic acid decreasing down to 2.1 -- Vitals stable. -- Patient's oxygen saturation drop down in the 80s on room air on ambulation recovered to more than 92% on 2 L nasal cannula. Patient will qualify for home oxygen. Will get DME nebulizer as well  History of asthma moderate persistent -- not on chronic oxygen at home -- PRN nebs and inhalers -- given cough and wheezing will give few days of prednisone -- patient follows with [facility]  Morbid obesity, sleep apnea -- continue CPAP  Hypertension -- on metoprolol and losartan  A fib-paroxysmal -- heart rate control continue beta-blockers and eliquis -- patient also on amiodarone  Type II diabetes with hyperglycemia in the setting of steroids Hyperlipidemia -- statins -- A1c in July was 7.2 -- continue sliding scale. Patient takes metformin  at home will resume at discharge  Depression anxiety --continue Paxil and as needed Xanax  Headache --fioricet x 1   Family communication : husband at bedside  consults : none CODE STATUS: full DVT Prophylaxis : eliquis Level of care: Telemetry Medical Status is: Inpatient Remains inpatient appropriate because: pneumonia    Patient encouraged to ambulate around and out of bed to chair at least 3 to 4 times a day  If remains stable discharge tomorrow with home  oxygen and nebulizer. Patient and husband agreeable.  TOTAL TIME TAKING CARE OF THIS PATIENT: 35 minutes.  >50% time spent on counselling and coordination of care  Note: This dictation was prepared with Dragon dictation along with smaller phrase technology. Any transcriptional errors that result from this process are unintentional.  Enedina Finner M.D    [facility]   CC: Primary care physician; Etta Grandchild, MD

## 2022-10-05 NOTE — Progress Notes (Signed)
Mobility Specialist - Progress Note   Pre-mobility:SpO2(95) on 2.5L; 94% on RA During mobility: SpO2(87% after 65 ft) Post-mobility: SPO2(94%)     10/05/22 0906  Mobility  Activity Ambulated with assistance in hallway;Stood at bedside;Dangled on edge of bed  Level of Assistance Contact guard assist, steadying assist  Assistive Device Other (Comment) (Iv Pole)  Distance Ambulated (ft) 180 ft  Range of Motion/Exercises Active  Activity Response Tolerated well  Mobility Referral Yes  $Mobility charge 1 Mobility  Mobility Specialist Start Time (ACUTE ONLY) 0840  Mobility Specialist Stop Time (ACUTE ONLY) [id number]  Mobility Specialist Time Calculation (min) (ACUTE ONLY) 23 min   Nurse requested Mobility Specialist to perform oxygen saturation test with pt which includes removing pt from oxygen both at rest and while ambulating.  Below are the results from that testing.     Patient Saturations on Room Air at Rest = spO2 94%  Patient Saturations on Room Air while Ambulating = sp02 92% . Desats to 86-87% after 65 ft. And wheezing.  Rested and performed pursed lip breathing for 1 minute with sp02 at 88-90%.  Patient returned to EOB on RA and 2.5L O2 applied to recover SOB and minimize wheezing. Pt recovers to 94%.  At end of testing pt left in room on 2.5L  Liters of oxygen.  Reported results to nurse.    Pt STS MinA and ambulates to hallway around NS CGA due fatigue holding onto IV pole for support. Pt endorses no dizziness but fatigue/weakness. Pt took x2 standing rest breaks during ambulation session. Pt returned to bed and left with needs in reach. Bed alarm activated.   Kirsten [name] Mobility Specialist 10/05/22, 9:20 AM

## 2022-10-06 DIAGNOSIS — R0602 Shortness of breath: Secondary | ICD-10-CM | POA: Diagnosis not present

## 2022-10-06 LAB — GLUCOSE, CAPILLARY
Glucose-Capillary: 127 mg/dL — ABNORMAL HIGH (ref 70–99)
Glucose-Capillary: 171 mg/dL — ABNORMAL HIGH (ref 70–99)
Glucose-Capillary: 340 mg/dL — ABNORMAL HIGH (ref 70–99)
Glucose-Capillary: 220 mg/dL — ABNORMAL HIGH (ref 70–99)
Glucose-Capillary: 96 mg/dL (ref 70–99)

## 2022-10-06 MED ORDER — FUROSEMIDE 10 MG/ML IJ SOLN
20.0000 mg | Freq: Once | INTRAMUSCULAR | Status: AC
  Administered 2022-10-06: 20 mg via INTRAVENOUS
  Filled 2022-10-06: qty 2

## 2022-10-06 NOTE — Progress Notes (Signed)
Mobility Specialist - Progress Note   Pre-mobility: SpO2(2L 95) During mobility: SpO2(2L 91)     10/06/22 1103  Mobility  Activity Ambulated with assistance in hallway  Level of Assistance Standby assist, set-up cues, supervision of patient - no hands on  Assistive Device Other (Comment) (IV Pole)  Distance Ambulated (ft) 140 ft  Range of Motion/Exercises Active  Activity Response Tolerated well  Mobility Referral Yes  $Mobility charge 1 Mobility  Mobility Specialist Start Time (ACUTE ONLY) 1037  Mobility Specialist Stop Time (ACUTE ONLY) 1104  Mobility Specialist Time Calculation (min) (ACUTE ONLY) 27 min   Pt resting in bed on 2L upon entry. Pt STS CGA due to instability during initial stand and ambulates to hallway SBA holding onto IV pole. (RN notified of instability) Pt took x3 stand rest breaks during ambulation to recover from SOB. Pt returned to recliner and left with needs in reach. Husband present at bed side.   Kirsten [name] Mobility Specialist 10/06/22, 1:23 PM

## 2022-10-06 NOTE — Progress Notes (Signed)
Progress Note    Kirsten [name]  WNU:[id number] DOB: October 02, 1955  DOA: 10/02/2022 PCP: Etta Grandchild, MD      Brief Narrative:    Medical records reviewed and are as summarized below:  Kirsten [name] is a 67 y.o. 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  She presented to the hospital because of shortness of breath that is worse with exertion.  Vitals in the ED showed temperature of 98.7, respiration rate of 20, heart rate of 92, blood pressure 170/103, SpO2 of 93% on room air.  Oxygen saturation dropped to 86% on room air with ambulation.  Serum sodium is 136, potassium 3.8, chloride 103, bicarb 25, BUN of 14, serum creatinine of 0.81, nonfasting blood glucose 218, WBC 7.7, hemoglobin 12.9, platelets of 202.  Chest x-ray 2 views: Read as hazy opacity at the left lung base could represent atelectasis or infection.  Possible trace right pleural effusion.  ED treatment: DuoNebs one-time treatment, Toradol 15 mg IV one-time dose, Solu-Medrol, cefepime, azithromycin 500 mg IV, sodium chloride 1 L bolus.      Assessment/Plan:   Principal Problem:   Shortness of breath Active Problems:   Asthma, mild persistent   Acute hypoxic respiratory failure (HCC)   Depression with anxiety   OSA on CPAP   Hypothyroidism   Essential hypertension, benign   Paroxysmal atrial fibrillation (HCC)   Type II diabetes mellitus with manifestations (HCC)   Hyperlipidemia with target LDL less than 100   GAD (generalized anxiety disorder)   NASH (nonalcoholic steatohepatitis)   Primary osteoarthritis of right hip    Body mass index is 30.54 kg/m.  (Obesity)   Severe sepsis secondary to left lobar pneumonia, small left  parapneumonic effusion: Plan to complete 5-day course of antibiotics today.  She got her large dose of IV ceftriaxone and IV azithromycin today.  No growth on blood cultures thus far. Lactic acid was up to 4.3.   Acute on chronic diastolic CHF, suspect fluid overload: Give 2 doses of IV Lasix today.  Continue oral Lasix tomorrow. 2D echo on 09/30/2021 showed EF estimated at 55 to 60%, indeterminate LV diastolic parameters   Acute on chronic hypoxic respiratory failure: She is on 2 L/min oxygen via [place].  She will need home oxygen.   Paroxysmal atrial fibrillation: Continue metoprolol, amiodarone and Eliquis   Type II DM with hyperglycemia: NovoLog as needed for hyperglycemia.  Hold metformin.   Moderate persistent asthma ?with exacerbation: Continue prednisone and bronchodilators.  Nebulizer has been prescribed for use at home.   Stable changes of pulmonary arterial hypertension on CT chest, history of pulmonary fibrosis: Outpatient follow-up with pulmonologist   Other comorbidities include OSA on CPAP, anxiety, depression, hypertension    Diet Order             Diet heart healthy/carb modified Fluid consistency: Thin  Diet effective now                            Consultants: None  Procedures: None    Medications:    amiodarone  200 mg Oral Daily   apixaban  5 mg Oral BID   atorvastatin  40 mg Oral QHS   fluticasone furoate-vilanterol  1 puff Inhalation Daily   furosemide  20 mg Oral Daily   gabapentin  200 mg Oral TID   insulin aspart  0-20 Units Subcutaneous TID WC   insulin aspart  0-5 Units Subcutaneous QHS   levothyroxine  175 mcg Oral QAC breakfast   losartan  25 mg Oral Daily   metFORMIN  500 mg Oral BID WC   metoprolol succinate  12.5 mg Oral Daily   PARoxetine  25 mg Oral Daily   predniSONE  40 mg Oral Daily   Continuous Infusions:  cefTRIAXone (ROCEPHIN)  IV 1 g (10/05/22 0815)     Anti-infectives (From admission, onward)     Start     Dose/Rate Route Frequency Ordered Stop   10/03/22 2000  vancomycin (VANCOREADY) IVPB 1250 mg/250 mL  Status:  Discontinued        1,250 mg 166.7 mL/hr over 90 Minutes Intravenous Every 24 hours 10/02/22 1958 10/03/22 0850   10/03/22 1000  azithromycin (ZITHROMAX) 500 mg in sodium chloride 0.9 % 250 mL IVPB        500 mg 250 mL/hr over 60 Minutes Intravenous Every 24 hours 10/02/22 1545 10/06/22 0937   10/03/22 0945  cefTRIAXone (ROCEPHIN) 1 g in sodium chloride 0.9 % 100 mL IVPB        1 g 200 mL/hr over 30 Minutes Intravenous Every 24 hours 10/03/22 0850 10/08/22 0944   10/02/22 2200  ceFEPIme (MAXIPIME) 2 g in sodium chloride 0.9 % 100 mL IVPB  Status:  Discontinued        2 g 200 mL/hr over 30 Minutes Intravenous Every 8 hours 10/02/22 1953 10/02/22 2000   10/02/22 2045  vancomycin (VANCOREADY) IVPB 1750 mg/350 mL        1,750 mg 175 mL/hr over 120 Minutes Intravenous  Once 10/02/22 1958 10/02/22 2335   10/02/22 2030  ceFEPIme (MAXIPIME) 2 g in sodium chloride 0.9 % 100 mL IVPB  Status:  Discontinued        2 g 200 mL/hr over 30 Minutes Intravenous Every 8 hours 10/02/22 1958 10/03/22 0850   10/02/22 1200  ceFEPIme (MAXIPIME) 2 g in sodium chloride 0.9 % 100 mL IVPB        2 g 200 mL/hr over 30 Minutes Intravenous  Once 10/02/22 1154 10/02/22 1319   10/02/22 1200  azithromycin (ZITHROMAX) 500 mg in sodium chloride 0.9 % 250 mL IVPB        500 mg 250 mL/hr over 60 Minutes Intravenous  Once 10/02/22 1154 10/02/22 1440              Family Communication/Anticipated D/C date and plan/Code Status   DVT prophylaxis: Place TED hose Start: 10/02/22 1252 apixaban (ELIQUIS) tablet 5 mg     Code Status: Full Code  Family Communication: Plan discussed with her husband at the bedside Disposition Plan: Plan to discharge home tomorrow   Status is: Inpatient Remains inpatient appropriate because: CHF exacerbation       Subjective:   Interval events noted.  She  complains of shortness of breath that is worse with exertion.  She also complains of wheezing and cough.  She feels she is not at her baseline.  Her husband was at the bedside.  Objective:    Vitals:   10/05/22 1957 10/05/22 2056 10/06/22 0619 10/06/22 0801  BP:  139/62 (!) 164/69 (!) 160/71  Pulse: 81 72 70 67  Resp:   16 16  Temp:  98 F (36.7 C) 98 F (36.7 C) 97.6 F (36.4 C)  TempSrc:  Oral   SpO2: 94% 93% 99% 98%  Weight:      Height:       No data found.   Intake/Output Summary (Last 24 hours) at 10/06/2022 1005 Last data filed at 10/05/2022 1409 Gross per 24 hour  Intake 480 ml  Output --  Net 480 ml   Filed Weights   10/02/22 0955  Weight: 88.5 kg    Exam:  GEN: NAD SKIN: Warm and dry EYES: No pallor or icterus ENT: MMM CV: RRR PULM: Bibasilar rales, b/l wheezing ABD: soft, obese, NT, +BS CNS: AAO x 3, non focal EXT: Bilateral leg edema, no erythema or tenderness        Data Reviewed:   I have personally reviewed following labs and imaging studies:  Labs: Labs show the following:   Basic Metabolic Panel: Recent Labs  Lab 10/02/22 1014 10/03/22 0528  NA 136 141  K 3.8 4.3  CL 103 112*  CO2 25 25  GLUCOSE 218* 216*  BUN 14 12  CREATININE 0.81 0.67  CALCIUM 9.1 9.1   GFR Estimated Creatinine Clearance: 80.1 mL/min (by C-G formula based on SCr of 0.67 mg/dL). Liver Function Tests: Recent Labs  Lab 10/02/22 1222  AST 26  ALT 25  ALKPHOS 62  BILITOT 0.7  PROT 6.7  ALBUMIN 3.4*   No results for input(s): "LIPASE", "AMYLASE" in the last 168 hours. No results for input(s): "AMMONIA" in the last 168 hours. Coagulation profile No results for input(s): "INR", "PROTIME" in the last 168 hours.  CBC: Recent Labs  Lab 10/02/22 1014 10/03/22 0528  WBC 7.7 5.9  HGB 12.9 12.3  HCT 40.3 39.1  MCV 91.4 92.9  PLT 202 187   Cardiac Enzymes: No results for input(s): "CKTOTAL", "CKMB", "CKMBINDEX", "TROPONINI" in the last 168  hours. BNP (last 3 results) No results for input(s): "PROBNP" in the last 8760 hours. CBG: Recent Labs  Lab 10/05/22 0748 10/05/22 1143 10/05/22 1617 10/05/22 2120 10/06/22 0803  GLUCAP 184* 142* 213* 212* 127*   D-Dimer: No results for input(s): "DDIMER" in the last 72 hours. Hgb A1c: No results for input(s): "HGBA1C" in the last 72 hours. Lipid Profile: No results for input(s): "CHOL", "HDL", "LDLCALC", "TRIG", "CHOLHDL", "LDLDIRECT" in the last 72 hours. Thyroid function studies: No results for input(s): "TSH", "T4TOTAL", "T3FREE", "THYROIDAB" in the last 72 hours.  Invalid input(s): "FREET3" Anemia work up: No results for input(s): "VITAMINB12", "FOLATE", "FERRITIN", "TIBC", "IRON", "RETICCTPCT" in the last 72 hours. Sepsis Labs: Recent Labs  Lab 10/02/22 1014 10/02/22 1222 10/02/22 1808 10/02/22 1844 10/02/22 2054 10/03/22 0528 10/03/22 1747 10/04/22 0939  PROCALCITON  --  <0.10  --   --   --   --   --   --   WBC 7.7  --   --   --   --  5.9  --   --   LATICACIDVEN  --  1.9   < > 3.5* 2.4*  --  4.3* 2.1*   < > = values in this interval not displayed.    Microbiology Recent Results (from the past 240 hour(s))  Resp panel by RT-PCR (RSV, Flu A&B, Covid) Anterior Nasal Swab     Status: None   Collection Time: 10/02/22 10:14 AM   Specimen: Anterior Nasal Swab  Result Value Ref Range Status   SARS Coronavirus 2 by RT PCR NEGATIVE NEGATIVE Final    Comment: (NOTE) SARS-CoV-2 target nucleic acids are NOT DETECTED.  The SARS-CoV-2 RNA is generally  detectable in upper respiratory specimens during the acute phase of infection. The lowest concentration of SARS-CoV-2 viral copies this assay can detect is 138 copies/mL. A negative result does not preclude SARS-Cov-2 infection and should not be used as the sole basis for treatment or other patient management decisions. A negative result may occur with  improper specimen collection/handling, submission of specimen  other than nasopharyngeal swab, presence of viral mutation(s) within the areas targeted by this assay, and inadequate number of viral copies(<138 copies/mL). A negative result must be combined with clinical observations, patient history, and epidemiological information. The expected result is Negative.  Fact Sheet for Patients:  BloggerCourse.com  Fact Sheet for Healthcare Providers:  SeriousBroker.it  This test is no t yet approved or cleared by the [place] FDA and  has been authorized for detection and/or diagnosis of SARS-CoV-2 by FDA under an Emergency Use Authorization (EUA). This EUA will remain  in effect (meaning this test can be used) for the duration of the COVID-19 declaration under Section 564(b)(1) of the Act, 21 U.S.C.section 360bbb-3(b)(1), unless the authorization is terminated  or revoked sooner.       Influenza A by PCR NEGATIVE NEGATIVE Final   Influenza B by PCR NEGATIVE NEGATIVE Final    Comment: (NOTE) The Xpert Xpress SARS-CoV-2/FLU/RSV plus assay is intended as an aid in the diagnosis of influenza from Nasopharyngeal swab specimens and should not be used as a sole basis for treatment. Nasal washings and aspirates are unacceptable for Xpert Xpress SARS-CoV-2/FLU/RSV testing.  Fact Sheet for Patients: BloggerCourse.com  Fact Sheet for Healthcare Providers: SeriousBroker.it  This test is not yet approved or cleared by the [place] FDA and has been authorized for detection and/or diagnosis of SARS-CoV-2 by FDA under an Emergency Use Authorization (EUA). This EUA will remain in effect (meaning this test can be used) for the duration of the COVID-19 declaration under Section 564(b)(1) of the Act, 21 U.S.C. section 360bbb-3(b)(1), unless the authorization is terminated or revoked.     Resp Syncytial Virus by PCR NEGATIVE NEGATIVE Final     Comment: (NOTE) Fact Sheet for Patients: BloggerCourse.com  Fact Sheet for Healthcare Providers: SeriousBroker.it  This test is not yet approved or cleared by the [place] FDA and has been authorized for detection and/or diagnosis of SARS-CoV-2 by FDA under an Emergency Use Authorization (EUA). This EUA will remain in effect (meaning this test can be used) for the duration of the COVID-19 declaration under Section 564(b)(1) of the Act, 21 U.S.C. section 360bbb-3(b)(1), unless the authorization is terminated or revoked.  Performed at [facility], [address] [place], [place] [address]   Blood Culture (routine x 2)     Status: None (Preliminary result)   Collection Time: 10/02/22 12:22 PM   Specimen: BLOOD  Result Value Ref Range Status   Specimen Description BLOOD RIGHT FA  Final   Special Requests   Final    BOTTLES DRAWN AEROBIC AND ANAEROBIC Blood Culture adequate volume   Culture   Final    NO GROWTH 4 DAYS Performed at [facility], [address], [place], [place] [address]    Report Status PENDING  Incomplete  Blood Culture (routine x 2)     Status: None (Preliminary result)   Collection Time: 10/02/22 12:23 PM   Specimen: BLOOD  Result Value Ref Range Status   Specimen Description BLOOD LEFT [facility]  Final   Special Requests   Final    BOTTLES DRAWN AEROBIC AND ANAEROBIC  Blood Culture results may not be optimal due to an inadequate volume of blood received in culture bottles   Culture   Final    NO GROWTH 4 DAYS Performed at [facility], [address] [place], [place] [address]    Report Status PENDING  Incomplete    Procedures and diagnostic studies:  DG Chest Port 1 View  Result Date: 10/05/2022 CLINICAL DATA:  Cough. EXAM: PORTABLE CHEST 1 VIEW COMPARISON:  October 02, 2022. FINDINGS: Stable cardiomegaly. Bibasilar edema or atelectasis is noted. Probable small bilateral  pleural effusions as well. Bony thorax is unremarkable. IMPRESSION: Mild bibasilar opacities are noted concerning for subsegmental atelectasis or edema. Probable small pleural effusions. Electronically Signed   By: Lupita Raider M.D.   On: 10/05/2022 09:09               LOS: 3 days   Suhayla Chisom  [facility]   Pager on [email or web address]. If 7PM-7AM, please contact night-coverage at [email or web address]     10/06/2022, 10:05 AM

## 2022-10-07 DIAGNOSIS — I5033 Acute on chronic diastolic (congestive) heart failure: Secondary | ICD-10-CM | POA: Diagnosis not present

## 2022-10-07 DIAGNOSIS — R6520 Severe sepsis without septic shock: Secondary | ICD-10-CM

## 2022-10-07 DIAGNOSIS — A419 Sepsis, unspecified organism: Secondary | ICD-10-CM | POA: Diagnosis not present

## 2022-10-07 DIAGNOSIS — J189 Pneumonia, unspecified organism: Secondary | ICD-10-CM | POA: Diagnosis not present

## 2022-10-07 LAB — BASIC METABOLIC PANEL
Anion gap: 8 (ref 5–15)
BUN: 17 mg/dL (ref 8–23)
CO2: 37 mmol/L — ABNORMAL HIGH (ref 22–32)
Calcium: 9.4 mg/dL (ref 8.9–10.3)
Chloride: 94 mmol/L — ABNORMAL LOW (ref 98–111)
Creatinine, Ser: 0.73 mg/dL (ref 0.44–1.00)
GFR, Estimated: >60 mL/min (ref >60)
Glucose, Bld: 117 mg/dL — ABNORMAL HIGH (ref 70–99)
Potassium: 3.4 mmol/L — ABNORMAL LOW (ref 3.5–5.1)
Sodium: 139 mmol/L (ref 135–145)

## 2022-10-07 LAB — GLUCOSE, CAPILLARY: Glucose-Capillary: 104 mg/dL — ABNORMAL HIGH (ref 70–99)

## 2022-10-07 LAB — CULTURE, BLOOD (ROUTINE X 2)
Culture: NO GROWTH
Culture: NO GROWTH
Special Requests: ADEQUATE

## 2022-10-07 LAB — MAGNESIUM: Magnesium: 2.2 mg/dL (ref 1.7–2.4)

## 2022-10-07 MED ORDER — GABAPENTIN 100 MG PO CAPS: 300.0000 mg | ORAL_CAPSULE | Freq: Three times a day (TID) | ORAL | Status: DC

## 2022-10-07 MED ORDER — POTASSIUM CHLORIDE CRYS ER 20 MEQ PO TBCR
40.0000 meq | EXTENDED_RELEASE_TABLET | Freq: Once | ORAL | Status: AC
  Administered 2022-10-07: 40 meq via ORAL
  Filled 2022-10-07: qty 2

## 2022-10-07 NOTE — TOC Transition Note (Signed)
Transition of Care [facility]) - CM/SW Discharge Note   Patient Details  [name] MRN: [id number] Date of Birth: July 14, 1955  Transition of [facility]) CM/SW Contact:  Bing Quarry, RN Phone Number: 10/07/2022, 9:43 AM   Clinical Narrative:  8/25: Patient has discharge orders in. Checking to make sure oxygen/nebulizer orders sent to [facility] on 10/05/22 were delivered/set up prior to discharge. Contacted Unit RN.  Oxygen 2L/ continuous stationary and portable units needed. Duration: 6 months.Nebulizer for Asthma, duration lifetime. Follow up with PCP in 1 week, PCP listed in chart.  RN CM Verified that oxygen and nebulizer set up was completed per [facility] with transport oxygen delivered to room.   Gabriel Cirri MSN RN CM  Transitions of Care Department [facility] [phone] Weekends Only     Final next level of care: Home/Self Care     Patient Goals and CMS Choice   Choice offered to / list presented to : NA  Discharge Placement                         Discharge Plan and Services Additional resources added to the After Visit Summary for                  DME Arranged: Oxygen DME Agency: [facility]       HH Arranged: NA HH Agency: NA        Social Determinants of Health (SDOH) Interventions SDOH Screenings   Food Insecurity: No Food Insecurity (10/02/2022)  Housing: Low Risk  (10/02/2022)  Transportation Needs: No Transportation Needs (10/02/2022)  Utilities: Not At Risk (10/02/2022)  Alcohol Screen: Low Risk  (09/03/2022)  Depression (PHQ2-9): Low Risk  (09/03/2022)  Financial Resource Strain: Low Risk  (09/03/2022)  Physical Activity: Insufficiently Active (09/03/2022)  Social Connections: Moderately Integrated (09/03/2022)  Stress: No Stress Concern Present (09/03/2022)  Tobacco Use: Low Risk  (10/02/2022)  Health Literacy: Adequate Health Literacy (09/03/2022)     Readmission Risk Interventions     No data to display

## 2022-10-07 NOTE — Discharge Summary (Addendum)
Physician Discharge Summary   Patient: Kirsten [name] MRN: [id number] DOB: 1955-12-11  Admit date:     10/02/2022  Discharge date: 10/07/22  Discharge Physician: Lurene Shadow   PCP: Etta Grandchild, MD   Recommendations at discharge:   Follow-up with PCP in 1 week Follow-up with pulmonologist and cardiologist as an outpatient  Discharge Diagnoses: Principal Problem:   Severe sepsis (HCC) Active Problems:   Asthma, mild persistent   Acute hypoxic respiratory failure (HCC)   Depression with anxiety   OSA on CPAP   Hypothyroidism   Essential hypertension, benign   Paroxysmal atrial fibrillation (HCC)   Type II diabetes mellitus with manifestations (HCC)   Hyperlipidemia with target LDL less than 100   GAD (generalized anxiety disorder)   NASH (nonalcoholic steatohepatitis)   Primary osteoarthritis of right hip   CAP (community acquired pneumonia)   Shortness of breath   Acute on chronic diastolic CHF (congestive heart failure) (HCC)  Resolved Problems:   * No resolved hospital problems. *  Hospital Course:  Kirsten [name] is a 67 y.o. 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  She presented to the hospital because of shortness of breath that is worse with exertion.   Vitals in the ED showed temperature of 98.7, respiration rate of 20, heart rate of 92, blood pressure 170/103, SpO2 of 93% on room air.  Oxygen saturation dropped to 86% on room air with ambulation.   Serum sodium is 136, potassium 3.8, chloride 103, bicarb 25, BUN of 14, serum creatinine of 0.81, nonfasting blood glucose 218, WBC 7.7, hemoglobin 12.9, platelets of 202.   Chest x-ray 2 views: Read as hazy opacity at the left lung base could  represent atelectasis or infection.  Possible trace right pleural effusion.   ED treatment: DuoNebs one-time treatment, Toradol 15 mg IV one-time dose, Solu-Medrol, cefepime, azithromycin 500 mg IV, sodium chloride 1 L bolus.          Assessment and Plan:  Severe sepsis secondary to left lobar pneumonia, small left parapneumonic effusion: Improved.  Completed 5 days of IV ceftriaxone and azithromycin.   Lactic acid was up to 4.3.     Acute on chronic diastolic CHF, suspect fluid overload: Improved.  Continue oral Lasix at discharge.   2D echo on 09/30/2021 showed EF estimated at 55 to 60%, indeterminate LV diastolic parameters     Acute on chronic hypoxic respiratory failure: She is on 2 L/min oxygen via [place].  She will need home oxygen.     Paroxysmal atrial fibrillation: Continue metoprolol, amiodarone and Eliquis     Type II DM with hyperglycemia: Glucose levels have improved.  Continue metformin at discharge.     Moderate persistent asthma ?with exacerbation: Completed prednisone in the hospital.  Continue bronchodilators as needed.  Nebulizer has been prescribed for use at home.     Stable changes of pulmonary arterial hypertension on CT chest, history of pulmonary fibrosis: Outpatient follow-up with pulmonologist     Other comorbidities include OSA on CPAP, anxiety, depression, hypertension     Her condition has improved and she is deemed stable for discharge home today.  Discharge plan was discussed with the husband at the bedside.        Consultants: None Procedures performed: None  Disposition: Home Diet recommendation:  Discharge Diet Orders (  From admission, onward)     Start     Ordered   10/07/22 0000  Diet - low sodium heart healthy        10/07/22 0932   10/07/22 0000  Diet Carb Modified        10/07/22 0932           Cardiac and Carb modified diet DISCHARGE MEDICATION: Allergies as of 10/07/2022       Reactions   Doxycycline Rash    Factive [gemifloxacin Mesylate] Rash   Crestor [rosuvastatin]    GI upset   Sulfonamide Derivatives    REACTION: rash   Gemifloxacin Rash   Penicillins Hives, Rash   Has patient had a PCN reaction causing immediate rash, facial/tongue/throat swelling, SOB or lightheadedness with hypotension: No Has patient had a PCN reaction causing severe rash involving mucus membranes or skin necrosis: No Has patient had a PCN reaction that required hospitalization No Has patient had a PCN reaction occurring within the last 10 years: No If all of the above answers are "NO", then may proceed with Cephalosporin use.        Medication List     TAKE these medications    albuterol 108 (90 Base) MCG/ACT inhaler Commonly known as: VENTOLIN HFA Inhale 2 puffs into the lungs every 6 (six) hours as needed for wheezing or shortness of breath.   ALPRAZolam 0.25 MG tablet Commonly known as: XANAX Take 1 tablet (0.25 mg total) by mouth 3 (three) times daily as needed for anxiety.   amiodarone 200 MG tablet Commonly known as: PACERONE Take 1 tablet (200 mg total) by mouth daily.   atorvastatin 40 MG tablet Commonly known as: LIPITOR TAKE ONE TABLET BY MOUTH ONE TIME DAILY   cyanocobalamin 1000 MCG/ML injection Commonly known as: VITAMIN B12 INJECT INTO THE MUSCLE ONCE A MONTH   diclofenac Sodium 1 % Gel Commonly known as: VOLTAREN Apply 4 g topically 4 (four) times daily.   Eliquis 5 MG Tabs tablet Generic drug: apixaban TAKE ONE TABLET BY MOUTH TWICE A DAY   fluticasone 50 MCG/ACT nasal spray Commonly known as: FLONASE Place 1 spray into both nostrils daily.   fluticasone furoate-vilanterol 100-25 MCG/ACT Aepb Commonly known as: Breo Ellipta Inhale 1 puff into the lungs daily.   gabapentin 100 MG capsule Commonly known as: NEURONTIN Take 3 capsules (300 mg total) by mouth 3 (three) times daily. Take 100 mg by mouth 3 times a day. May add 100 mg every 3 to 5 days until taking 200 mg  3 times daily. What changed:  how much to take how to take this when to take this   Lasix 20 MG tablet Generic drug: furosemide Take 20 mg by mouth daily.   levothyroxine 175 MCG tablet Commonly known as: SYNTHROID Take 1 tablet (175 mcg total) by mouth daily.   losartan 25 MG tablet Commonly known as: COZAAR Take 1 tablet (25 mg total) by mouth daily.   metFORMIN 500 MG 24 hr tablet Commonly known as: GLUCOPHAGE-XR Take 1 tablet (500 mg total) by mouth 2 (two) times daily with a meal.   metoprolol succinate 25 MG 24 hr tablet Commonly known as: TOPROL-XL Take 0.5 tablets (12.5 mg total) by mouth daily.   PARoxetine 25 MG 24 hr tablet Commonly known as: PAXIL-CR TAKE ONE TABLET BY MOUTH ONE TIME DAILY               Durable Medical Equipment  (From admission, onward)  Start     Ordered   10/05/22 1206  For home use only DME Nebulizer machine  Once       Question Answer Comment  Patient needs a nebulizer to treat with the following condition Asthma   Length of Need Lifetime      10/05/22 1205   10/05/22 1206  For home use only DME oxygen  Once       Question Answer Comment  Length of Need 6 Months   Mode or (Route) Nasal cannula   Liters per Minute 2   Frequency Continuous (stationary and portable oxygen unit needed)   Oxygen conserving device Yes   Oxygen delivery system Gas      10/05/22 1205            Discharge Exam: Filed Weights   10/02/22 0955  Weight: 88.5 kg   GEN: NAD SKIN: Warm and dry EYES: No pallor or icterus ENT: MMM CV: RRR PULM: CTA B ABD: soft, obese, NT, +BS CNS: AAO x 3, non focal EXT: No edema or tenderness   Condition at discharge: good  The results of significant diagnostics from this hospitalization (including imaging, microbiology, ancillary and laboratory) are listed below for reference.   Imaging Studies: DG Chest Port 1 View  Result Date: 10/05/2022 CLINICAL DATA:  Cough. EXAM: PORTABLE  CHEST 1 VIEW COMPARISON:  October 02, 2022. FINDINGS: Stable cardiomegaly. Bibasilar edema or atelectasis is noted. Probable small bilateral pleural effusions as well. Bony thorax is unremarkable. IMPRESSION: Mild bibasilar opacities are noted concerning for subsegmental atelectasis or edema. Probable small pleural effusions. Electronically Signed   By: Lupita Raider M.D.   On: 10/05/2022 09:09   CT CHEST W CONTRAST  Result Date: 10/02/2022 CLINICAL DATA:  Chronic dyspnea, COPD, abnormal chest x-ray EXAM: CT CHEST WITH CONTRAST TECHNIQUE: Multidetector CT imaging of the chest was performed during intravenous contrast administration. RADIATION DOSE REDUCTION: This exam was performed according to the departmental dose-optimization program which includes automated exposure control, adjustment of the mA and/or kV according to patient size and/or use of iterative reconstruction technique. CONTRAST:  75mL OMNIPAQUE IOHEXOL 300 MG/ML  SOLN COMPARISON:  04/17/2022, concurrently performed chest radiograph FINDINGS: Cardiovascular: There is extensive left anterior descending coronary artery calcification. Stable cardiomegaly. Stable mediastinal shift to the right with mild dextrorotation of the heart. No pericardial effusion. Central pulmonary arteries are stably enlarged in keeping with changes of pulmonary arterial hypertension. The thoracic aorta is of normal caliber. Mild atherosclerotic calcification within the thoracic aorta. Persistent left-sided superior vena cava noted with drainage via the hemi azygous-azygous system to the superior vena cava. Mediastinum/Nodes: No enlarged mediastinal, hilar, or axillary lymph nodes. Thyroid gland, trachea, and esophagus demonstrate no significant findings. Lungs/Pleura: There is focal consolidation within the posterior basal segment of the left lower lobe in keeping with changes of acute lobar pneumonia in the appropriate clinical setting. Small left parapneumonic effusion.  Minimal infiltrate within the left upper lobe. Posterior right diaphragmatic hernia containing retroperitoneal fat again noted. Mild right-sided volume loss with right basilar atelectasis again noted. No pneumothorax. No pleural effusion on the right. No central obstructing lesion. Upper Abdomen: No acute abnormality. Musculoskeletal: No acute bone abnormality. Multiple healed left rib fractures noted. No acute bone abnormality. IMPRESSION: 1. Focal consolidation within the posterior basal segment of the left lower lobe in keeping with changes of acute lobar pneumonia in the appropriate clinical setting. Small left parapneumonic effusion. 2. Stable cardiomegaly. Extensive left anterior descending coronary artery calcification. 3.  Stable changes of pulmonary arterial hypertension. 4. Stable posterior right diaphragmatic hernia containing retroperitoneal fat. Aortic Atherosclerosis (ICD10-I70.0). Electronically Signed   By: Helyn Numbers M.D.   On: 10/02/2022 23:56   DG Chest 2 View  Result Date: 10/02/2022 CLINICAL DATA:  COPD EXAM: CHEST - 2 VIEW COMPARISON:  08/20/22 CXR FINDINGS: Cardiomegaly. Possible trace right pleural effusion. No pneumothorax. Hazy opacity at the left lung base could represent atelectasis or infection. No radiographically apparent displaced rib fractures. Visualized upper abdomen unremarkable. IMPRESSION: 1. Hazy opacity at the left lung base could represent atelectasis or infection. 2. Possible trace right pleural effusion. Electronically Signed   By: Lorenza Cambridge M.D.   On: 10/02/2022 11:33   CT RENAL STONE STUDY  Result Date: 09/15/2022 CLINICAL DATA:  Recurrent urinary tract infections. Past history of congenital lymphocele. EXAM: CT ABDOMEN AND PELVIS WITHOUT CONTRAST TECHNIQUE: Multidetector CT imaging of the abdomen and pelvis was performed following the standard protocol without IV contrast. RADIATION DOSE REDUCTION: This exam was performed according to the departmental  dose-optimization program which includes automated exposure control, adjustment of the mA and/or kV according to patient size and/or use of iterative reconstruction technique. COMPARISON:  02/12/2020 FINDINGS: Lower chest: No acute findings. Hepatobiliary: No mass visualized on this unenhanced exam. Stable hypertrophy of left hepatic lobe noted. Stable postop changes from cholecystectomy and hepaticojejunostomy. Pancreas: No mass or inflammatory process visualized on this unenhanced exam. Spleen:  Within normal limits in size. Adrenals/Urinary tract: No evidence of urolithiasis or hydronephrosis. Unremarkable unopacified urinary bladder. Stomach/Bowel: Congenital bowel malrotation again noted. No evidence of obstruction, inflammatory process, or abnormal fluid collections. Vascular/Lymphatic: No pathologically enlarged lymph nodes identified. No evidence of abdominal aortic aneurysm. Reproductive:  No mass or other significant abnormality. Other: Postop changes seen in anterior abdominal wall soft tissues. 2 small epigastric ventral hernias are seen, both containing only fat. Musculoskeletal:  No suspicious bone lesions identified. IMPRESSION: No evidence of urolithiasis, hydronephrosis, or other acute findings. Congenital bowel malrotation. Two small epigastric ventral hernias, both containing only fat. Electronically Signed   By: Danae Orleans M.D.   On: 09/15/2022 15:59    Microbiology: Results for orders placed or performed during the hospital encounter of 10/02/22  Resp panel by RT-PCR (RSV, Flu A&B, Covid) Anterior Nasal Swab     Status: None   Collection Time: 10/02/22 10:14 AM   Specimen: Anterior Nasal Swab  Result Value Ref Range Status   SARS Coronavirus 2 by RT PCR NEGATIVE NEGATIVE Final    Comment: (NOTE) SARS-CoV-2 target nucleic acids are NOT DETECTED.  The SARS-CoV-2 RNA is generally detectable in upper respiratory specimens during the acute phase of infection. The lowest concentration  of SARS-CoV-2 viral copies this assay can detect is 138 copies/mL. A negative result does not preclude SARS-Cov-2 infection and should not be used as the sole basis for treatment or other patient management decisions. A negative result may occur with  improper specimen collection/handling, submission of specimen other than nasopharyngeal swab, presence of viral mutation(s) within the areas targeted by this assay, and inadequate number of viral copies(<138 copies/mL). A negative result must be combined with clinical observations, patient history, and epidemiological information. The expected result is Negative.  Fact Sheet for Patients:  BloggerCourse.com  Fact Sheet for Healthcare Providers:  SeriousBroker.it  This test is no t yet approved or cleared by the [place] FDA and  has been authorized for detection and/or diagnosis of SARS-CoV-2 by FDA under an Emergency Use Authorization (EUA). This  EUA will remain  in effect (meaning this test can be used) for the duration of the COVID-19 declaration under Section 564(b)(1) of the Act, 21 U.S.C.section 360bbb-3(b)(1), unless the authorization is terminated  or revoked sooner.       Influenza A by PCR NEGATIVE NEGATIVE Final   Influenza B by PCR NEGATIVE NEGATIVE Final    Comment: (NOTE) The Xpert Xpress SARS-CoV-2/FLU/RSV plus assay is intended as an aid in the diagnosis of influenza from Nasopharyngeal swab specimens and should not be used as a sole basis for treatment. Nasal washings and aspirates are unacceptable for Xpert Xpress SARS-CoV-2/FLU/RSV testing.  Fact Sheet for Patients: BloggerCourse.com  Fact Sheet for Healthcare Providers: SeriousBroker.it  This test is not yet approved or cleared by the [place] FDA and has been authorized for detection and/or diagnosis of SARS-CoV-2 by FDA under an Emergency Use  Authorization (EUA). This EUA will remain in effect (meaning this test can be used) for the duration of the COVID-19 declaration under Section 564(b)(1) of the Act, 21 U.S.C. section 360bbb-3(b)(1), unless the authorization is terminated or revoked.     Resp Syncytial Virus by PCR NEGATIVE NEGATIVE Final    Comment: (NOTE) Fact Sheet for Patients: BloggerCourse.com  Fact Sheet for Healthcare Providers: SeriousBroker.it  This test is not yet approved or cleared by the [place] FDA and has been authorized for detection and/or diagnosis of SARS-CoV-2 by FDA under an Emergency Use Authorization (EUA). This EUA will remain in effect (meaning this test can be used) for the duration of the COVID-19 declaration under Section 564(b)(1) of the Act, 21 U.S.C. section 360bbb-3(b)(1), unless the authorization is terminated or revoked.  Performed at [facility], [address] [place], [place] [address]   Blood Culture (routine x 2)     Status: None   Collection Time: 10/02/22 12:22 PM   Specimen: BLOOD  Result Value Ref Range Status   Specimen Description BLOOD RIGHT FA  Final   Special Requests   Final    BOTTLES DRAWN AEROBIC AND ANAEROBIC Blood Culture adequate volume   Culture   Final    NO GROWTH 5 DAYS Performed at [facility], [address] [place], [place] [address]    Report Status 10/07/2022 FINAL  Final  Blood Culture (routine x 2)     Status: None   Collection Time: 10/02/22 12:23 PM   Specimen: BLOOD  Result Value Ref Range Status   Specimen Description BLOOD LEFT AC  Final   Special Requests   Final    BOTTLES DRAWN AEROBIC AND ANAEROBIC Blood Culture results may not be optimal due to an inadequate volume of blood received in culture bottles   Culture   Final    NO GROWTH 5 DAYS Performed at [facility], [address] [place], [place] [address]    Report Status 10/07/2022  FINAL  Final    Labs: CBC: Recent Labs  Lab 10/02/22 1014 10/03/22 0528  WBC 7.7 5.9  HGB 12.9 12.3  HCT 40.3 39.1  MCV 91.4 92.9  PLT 202 187   Basic Metabolic Panel: Recent Labs  Lab 10/02/22 1014 10/03/22 0528 10/07/22 0501  NA 136 141 139  K 3.8 4.3 3.4*  CL 103 112* 94*  CO2 25 25 37*  GLUCOSE 218* 216* 117*  BUN 14 12 17   CREATININE 0.81 0.67 0.73  CALCIUM 9.1 9.1 9.4  MG  --   --  2.2   Liver Function Tests: Recent Labs  Lab 10/02/22 1222  AST 26  ALT 25  ALKPHOS 62  BILITOT 0.7  PROT 6.7  ALBUMIN 3.4*   CBG: Recent Labs  Lab 10/06/22 1221 10/06/22 1608 10/06/22 2017 10/06/22 2138 10/07/22 0750  GLUCAP 171* 340* 220* 96 104*    Discharge time spent: greater than 30 minutes.  Signed: Lurene Shadow, MD [facility] 10/07/2022

## 2022-10-08 NOTE — Consult Note (Signed)
[facility] Customer service manager [facility]) Accountable Care Organization (ACO) [facility] Liaison Note  10/08/2022  Kirsten [name] March 22, 1955 [id number]  Location: [facility] RN Hospital Liaison screened the patient remotely at [facility].  Insurance: [employer]   Kirsten [name] is a 67 y.o. female who is a Primary Care Patient of Etta Grandchild, MD- [facility]  [place]. The patient was screened for  readmission hospitalization with noted high risk score for unplanned readmission risk with 2 IP/1 ED in 6 months.  The patient was assessed for potential [facility]) Care Management service needs for post hospital transition for care coordination. Review of patient's electronic medical record reveals patient was admitted with Severe Sepsis. Pt is active with [facility] in [place]. Liaison collaborated with [facility] concerning pt's recent discharged and spoke with representative from [facility] [place]) as this agency will continue to provide pt with her ongoing care management needs.  [facility] - Inpatient Care Management/Population Health does not replace or interfere with any arrangements made by the Inpatient Transition of Care team.   For questions contact:   Kirsten Cousin, RN, [facility] Liaison [place]   Population Health Office Hours MTWF  8:00 am-6:00 pm [phone] mobile [phone] [Office toll free line] Office Hours are M-F 8:30 - 5 pm [name]

## 2022-10-09 ENCOUNTER — Telehealth: Payer: Self-pay | Admitting: *Deleted

## 2022-10-09 ENCOUNTER — Encounter: Payer: Self-pay | Admitting: *Deleted

## 2022-10-09 NOTE — Transitions of Care (Post Inpatient/ED Visit) (Signed)
10/09/2022  [name] MRN: [id number] DOB: [birth year]  Today's TOC FU Call Status: Today's TOC FU Call Status:: Successful TOC FU Call Completed TOC FU Call Complete Date: 10/09/22 Patient's Name and Date of Birth confirmed.  Transition Care Management Follow-up Telephone Call Date of Discharge: 10/07/22 Discharge Facility: [facility]) Type of Discharge: Inpatient Admission Primary Inpatient Discharge Diagnosis:: SOB; severe sepsis and pneumonia How have you been since you were released from the hospital?: Better ("I am doing okay; my oxygen level drops sometimes when I go to the bathroon, but it comes right back up when I put the new home oxygen on.  I got the nebulizer-- but they didn't prescribe any medicine to go into it, so I am not using it yet") Any questions or concerns?: Yes Patient Questions/Concerns:: Patient reports she was given a new nebulizer machine for home use-- states "they did not prescribe any medicine to go in the nebulizer" Patient Questions/Concerns Addressed: Notified Provider of Patient Questions/Concerns, Other: (scheduled for follow up outreach from RN CM Care Coordinator for 10/16/22; instructed patient to continue using rescue and maintenance inhalers and to use new home O2 proactively with activity; made pulmonary provider aware of need for nebulizer med new Rx)  Items Reviewed: Did you receive and understand the discharge instructions provided?: Yes (thoroughly reviewed with patient who verbalizes good understanding of same) Medications obtained,verified, and reconciled?: Yes (Medications Reviewed) (Full medication reconciliation/ review completed; no concerns or discrepancies identified; self-manages medications and denies questions/ concerns around medications today) Any new allergies since your discharge?: No Dietary orders reviewed?: Yes Type of Diet Ordered:: "As healthy as I can" Do you have support at home?:  Yes People in Home: spouse Name of Support/Comfort Primary Source: Reports independent in self-care activities; supportive spouse assists as/ if needed/ indicated  Medications Reviewed Today: Medications Reviewed Today     Reviewed by Michaela Corner, RN (Registered Nurse) on 10/09/22 at 1436  Med List Status: <None>   Medication Order Taking? Sig Documenting Provider Last Dose Status Informant  albuterol (VENTOLIN HFA) 108 (90 Base) MCG/ACT inhaler [id number] Yes Inhale 2 puffs into the lungs every 6 (six) hours as needed for wheezing or shortness of breath. Arnetha Courser, MD Taking Active Self  ALPRAZolam Prudy Feeler) 0.25 MG tablet [id number] Yes Take 1 tablet (0.25 mg total) by mouth 3 (three) times daily as needed for anxiety. Etta Grandchild, MD Taking Active Self  amiodarone (PACERONE) 200 MG tablet [id number] Yes Take 1 tablet (200 mg total) by mouth daily. Laurier Nancy, MD Taking Active Self  atorvastatin (LIPITOR) 40 MG tablet [id number] Yes TAKE ONE TABLET BY MOUTH ONE TIME DAILY Etta Grandchild, MD Taking Active Self  cyanocobalamin (,VITAMIN B-12,) 1000 MCG/ML injection [id number] Yes INJECT INTO THE MUSCLE ONCE A MONTH Nita Sickle K, DO Taking Active Self, Pharmacy Records  diclofenac Sodium (VOLTAREN) 1 % GEL [id number] Yes Apply 4 g topically 4 (four) times daily. Arnetha Courser, MD Taking Active Self           Med Note Michaela Corner   Tue Oct 09, 2022  2:19 PM) 10/09/22: Reports during [facility] call has not needed recently  ELIQUIS 5 MG TABS tablet [id number] Yes TAKE ONE TABLET BY MOUTH TWICE A DAY Etta Grandchild, MD Taking Active Self  fluticasone (FLONASE) 50 MCG/ACT nasal spray [id number] Yes Place 1 spray into both nostrils daily. Arnetha Courser, MD Taking Active Self  fluticasone furoate-vilanterol (BREO ELLIPTA) 100-25  MCG/ACT AEPB [id number] Yes Inhale 1 puff into the lungs daily. Parrett, Virgel Bouquet, NP Taking Active Self  furosemide (LASIX) 20 MG tablet [id number] Yes Take 20 mg by  mouth daily. [provider] Taking Active Pharmacy Records, Self           Med Note Sherryle Lis, Ferrel Logan   Wed Oct 03, 2022 12:54 PM)    gabapentin (NEURONTIN) 100 MG capsule [id number] Yes Take 3 capsules (300 mg total) by mouth 3 (three) times daily. Take 100 mg by mouth 3 times a day. May add 100 mg every 3 to 5 days until taking 200 mg 3 times daily. Lurene Shadow, MD Taking Active            Med Note Michaela Corner   Tue Oct 09, 2022  2:22 PM) 10/09/22: Reports during [facility] call she is currently taking 100 mg po q am and 100 mg q pm: she does not know why the dosing was changed at time of hospital discharge on 10/07/22  levothyroxine (SYNTHROID) 175 MCG tablet [id number] Yes Take 1 tablet (175 mcg total) by mouth daily. Etta Grandchild, MD Taking Active Self  losartan (COZAAR) 25 MG tablet [id number]  Take 1 tablet (25 mg total) by mouth daily. Charise Killian, MD  Expired 10/02/22 2359 Self           Med Note Michaela Corner   Tue Oct 09, 2022  2:22 PM) 10/09/22: Reports during TOC call is taking as instructed   metFORMIN (GLUCOPHAGE-XR) 500 MG 24 hr tablet [id number] Yes Take 1 tablet (500 mg total) by mouth 2 (two) times daily with a meal. Etta Grandchild, MD Taking Active Self  metoprolol succinate (TOPROL-XL) 25 MG 24 hr tablet [id number]  Take 0.5 tablets (12.5 mg total) by mouth daily. Charise Killian, MD  Expired 10/02/22 2359 Self           Med Note Michaela Corner   Tue Oct 09, 2022  2:23 PM) 10/09/22: Reports during TOC call is taking as instructed   PARoxetine (PAXIL-CR) 25 MG 24 hr tablet [id number] Yes TAKE ONE TABLET BY MOUTH ONE TIME DAILY Etta Grandchild, MD Taking Active Self           Home Care and Equipment/Supplies: Were Home Health Services Ordered?: No Any new equipment or medical supplies ordered?: Yes (1) home O2-- obtained and using; 2) nebulizer- obtained but not yet using-- needs meds to go into nebulizer- as noted above) Name of Medical supply  agency?: [employer] Were you able to get the equipment/medical supplies?: Yes Do you have any questions related to the use of the equipment/supplies?: No  Functional Questionnaire: Do you need assistance with bathing/showering or dressing?: Yes (husband supervises) Do you need assistance with meal preparation?: Yes (husband assists as indicated) Do you need assistance with eating?: No Do you have difficulty maintaining continence: No Do you need assistance with getting out of bed/getting out of a chair/moving?: No Do you have difficulty managing or taking your medications?: No (husband assists as/ if needed)  Follow up appointments reviewed: PCP Follow-up appointment confirmed?: Yes Date of PCP follow-up appointment?: 10/23/22 (verified this is recommended time frame for follow up per hospital discharging provider notes) Follow-up Provider: PCP Specialist Hospital Follow-up appointment confirmed?: Yes Date of Specialist follow-up appointment?: 10/11/22 Follow-Up Specialty Provider:: Pulmonary Provider Do you need transportation to your follow-up appointment?: No Do you understand care options if your condition(s) worsen?: Yes-patient verbalized understanding  SDOH Interventions Today    Flowsheet Row Most Recent Value  SDOH Interventions   Food Insecurity Interventions Intervention Not Indicated  Transportation Interventions Intervention Not Indicated  [normally drives self,  husband assisiting as needed after hospital discharge on 10/07/22]      TOC Interventions Today    Flowsheet Row Most Recent Value  TOC Interventions   TOC Interventions Discussed/Reviewed TOC Interventions Discussed, Contacted provider for patient needs      Interventions Today    Flowsheet Row Most Recent Value  Chronic Disease   Chronic disease during today's visit Other  [severe sepsis with pneumonia]  General Interventions   General Interventions Discussed/Reviewed General Interventions Discussed,  Doctor Visits, Durable Medical Equipment (DME), Referral to Nurse, Communication with  [scheduled with RN CM Care Coordinator for follow up telephone visit on 10/16/22]  Doctor Visits Discussed/Reviewed Doctor Visits Discussed, PCP, Specialist  Durable Medical Equipment (DME) Other  [confirmed not currently requiring/ using assistive devices]  PCP/Specialist Visits Compliance with follow-up visit  Communication with PCP/Specialists, RN  Education Interventions   Education Provided Provided Education  Provided Verbal Education On Medication, Other  [basics of use of new home O2,  process to obtain medication Rx for new nebulizer,  need to proactively use home O2 during periods of activity]  Nutrition Interventions   Nutrition Discussed/Reviewed Nutrition Discussed  Pharmacy Interventions   Pharmacy Dicussed/Reviewed Pharmacy Topics Discussed  [Full medication review with updating medication list in EHR per patient report]  Safety Interventions   Safety Discussed/Reviewed Safety Discussed, Home Safety  [use of home O2]      Caryl Pina, RN, BSN, CCRN Alumnus RN CM Care Coordination/ Transition of Care- [facility] Care Management [phone]: direct office

## 2022-10-11 ENCOUNTER — Encounter: Payer: Self-pay | Admitting: Internal Medicine

## 2022-10-11 ENCOUNTER — Ambulatory Visit: Payer: PPO | Admitting: Internal Medicine

## 2022-10-11 VITALS — BP 130/60 | HR 83 | Temp 98.1°F | Ht 67.0 in | Wt 206.6 lb

## 2022-10-11 DIAGNOSIS — J4540 Moderate persistent asthma, uncomplicated: Secondary | ICD-10-CM | POA: Diagnosis not present

## 2022-10-11 DIAGNOSIS — J9611 Chronic respiratory failure with hypoxia: Secondary | ICD-10-CM | POA: Diagnosis not present

## 2022-10-11 DIAGNOSIS — I5181 Takotsubo syndrome: Secondary | ICD-10-CM

## 2022-10-11 MED ORDER — ALBUTEROL SULFATE (2.5 MG/3ML) 0.083% IN NEBU
2.5000 mg | INHALATION_SOLUTION | RESPIRATORY_TRACT | 10 refills | Status: DC | PRN
Start: 2022-10-11 — End: 2023-11-07

## 2022-10-11 NOTE — Progress Notes (Signed)
@Patient  ID: Kirsten [name], female    DOB: 03/27/1955, 67 y.o.   MRN: [id number]  TEST/EVENTS :  PFT 09/20/09 >> FEV1 1.55 (57%), FEV1% 83, TLC 3.00 (54%), DLCO 58% RAST 11/02/15 >> negative IgE 10/28/20 >> 3   Sleep Tests:  PSG 07/22/08 >> AHI 80.4, SpO2 low 82% CPAP 02/22/21 to 02/27/21 >> used on 6 of 6 nights with average 7 hrs 3 min.  Average AHI 0.7 with CPAP 8 cm H2O   Cardiac Tests:  Echo 12/12/20 >> EF greater than 55%, mild LVH, grade 2 DD  SYNOPSIS 67 yo female never smoker followed for OSA and Asthma  Medical history significant for diastolic heart failure, A-fib on amiodarone and Eliquis and Diabetes  Hospitalization August 2023 stress-induced cardiomyopathy after severe traumatic fall with multiple left-sided rib fractures and left clavicular fracture hospital stay was complicated by hypercarbic and hypoxic respiratory failure.  Initial echo showed EF at 35 to 45%.  Treated for pneumonia Severe H1N1 infection 2011 with critical illness -pneumonia , prolonged hospitalization   CC Follow-up assessment for asthma Follow-up assessment for OSA Follow-up abnormal CT chest left lower lobe pneumonia  HPI: Assessment asthma Underlying moderate persistent asthma Seems to be well-controlled at this time No exacerbation at this time No evidence of heart failure at this time No evidence or signs of infection at this time No respiratory distress No fevers, chills, nausea, vomiting, diarrhea No evidence of lower extremity edema No evidence hemoptysis changed from Symbicort to Breo due to insurance coverage.  Says she is doing well on Breo.  Had no albuterol use.    Patient was recently admitted to the hospital last week due to left lower lobe pneumonia and sepsis Patient was discharged on oxygen therapy  Hypoxic respiratory failure due to asthma exacerbation left lower lobe pneumonia in the setting of stress-induced cardiomyopathy    Assessment of OSA Has underlying sleep  apnea.  Says she is doing well on CPAP.  She says she cannot sleep without it.  She feels rested and feels that she benefits from CPAP.  CPAP download shows 100% compliance.  Daily average usage at 10.5 hours.  Patient is on CPAP 8 cm H2O.  Uses a fullface mask.  AHI is 1.20hour.  No mask leaks Patient is benefits from therapy Excellent compliance report  Follow-up abnormal CT chest Left lower lobe opacification with air bronchograms sign seen on CT scan 10/02/2022 Patient admitted for sepsis due to left lower lobe pneumonia Follow-up assessment patient still has some persistent wheezing however feels much better     Allergies  Allergen Reactions   Doxycycline Rash   Factive [Gemifloxacin Mesylate] Rash   Crestor [Rosuvastatin]     GI upset   Sulfonamide Derivatives     REACTION: rash   Gemifloxacin Rash   Penicillins Hives and Rash    Has patient had a PCN reaction causing immediate rash, facial/tongue/throat swelling, SOB or lightheadedness with hypotension: No Has patient had a PCN reaction causing severe rash involving mucus membranes or skin necrosis: No Has patient had a PCN reaction that required hospitalization No Has patient had a PCN reaction occurring within the last 10 years: No If all of the above answers are "NO", then may proceed with Cephalosporin use.     Immunization History  Administered Date(s) Administered   Influenza Split 11/29/2010, 11/23/2011   Influenza Whole 11/12/2009   Influenza,inj,Quad PF,6+ Mos 10/22/2012, 12/31/2013, 11/02/2014, 11/28/2015, 11/23/2016, 12/25/2017, 11/03/2018, 11/16/2019, 12/20/2020, 11/01/2021   PFIZER(Purple Top)SARS-COV-2  Vaccination 04/25/2019, 05/20/2019, 02/08/2020   PNEUMOCOCCAL CONJUGATE-20 12/20/2020   Pneumococcal Polysaccharide-23 11/23/2011, 11/16/2019   Td 11/01/2021   Tdap 08/02/2010    Past Medical History:  Diagnosis Date   A-fib (HCC)    Anxiety disorder    Asthma    Dyspnea    Gastroschisis     umphalocele, rotated organs abd repair until age 12   GERD (gastroesophageal reflux disease)    HTN (hypertension)    Hyperlipidemia    IBS (irritable bowel syndrome)    Morbid obesity (HCC)    Target wt - 185  for BMI < 30   Obesity    OSA on CPAP    SBO (small bowel obstruction) (HCC)    Resolved with NG/Bowel rest around 2009   Sleep apnea    Type II or unspecified type diabetes mellitus without mention of complication, not stated as uncontrolled     Tobacco History: Social History   Tobacco Use  Smoking Status Never  Smokeless Tobacco Never  Tobacco Comments   "tried as a teen"   Counseling given: Not Answered Tobacco comments: "tried as a teen"   Outpatient Medications Prior to Visit  Medication Sig Dispense Refill   albuterol (VENTOLIN HFA) 108 (90 Base) MCG/ACT inhaler Inhale 2 puffs into the lungs every 6 (six) hours as needed for wheezing or shortness of breath. 8 g 2   ALPRAZolam (XANAX) 0.25 MG tablet Take 1 tablet (0.25 mg total) by mouth 3 (three) times daily as needed for anxiety. 270 tablet 0   amiodarone (PACERONE) 200 MG tablet Take 1 tablet (200 mg total) by mouth daily. 90 tablet 0   atorvastatin (LIPITOR) 40 MG tablet TAKE ONE TABLET BY MOUTH ONE TIME DAILY 90 tablet 1   cyanocobalamin (,VITAMIN B-12,) 1000 MCG/ML injection INJECT INTO THE MUSCLE ONCE A MONTH 10 mL 0   diclofenac Sodium (VOLTAREN) 1 % GEL Apply 4 g topically 4 (four) times daily. 350 g 1   ELIQUIS 5 MG TABS tablet TAKE ONE TABLET BY MOUTH TWICE A DAY 180 tablet 1   fluticasone (FLONASE) 50 MCG/ACT nasal spray Place 1 spray into both nostrils daily. 18 mL 2   fluticasone furoate-vilanterol (BREO ELLIPTA) 100-25 MCG/ACT AEPB Inhale 1 puff into the lungs daily. 30 each 5   furosemide (LASIX) 20 MG tablet Take 20 mg by mouth daily.     gabapentin (NEURONTIN) 100 MG capsule Take 3 capsules (300 mg total) by mouth 3 (three) times daily. Take 100 mg by mouth 3 times a day. May add 100 mg every  3 to 5 days until taking 200 mg 3 times daily.     levothyroxine (SYNTHROID) 175 MCG tablet Take 1 tablet (175 mcg total) by mouth daily. 90 tablet 0   losartan (COZAAR) 25 MG tablet Take 1 tablet (25 mg total) by mouth daily. 30 tablet 0   metFORMIN (GLUCOPHAGE-XR) 500 MG 24 hr tablet Take 1 tablet (500 mg total) by mouth 2 (two) times daily with a meal. 180 tablet 0   metoprolol succinate (TOPROL-XL) 25 MG 24 hr tablet Take 0.5 tablets (12.5 mg total) by mouth daily. 15 tablet 0   PARoxetine (PAXIL-CR) 25 MG 24 hr tablet TAKE ONE TABLET BY MOUTH ONE TIME DAILY 90 tablet 1   No facility-administered medications prior to visit.    BP 130/60 (BP Location: Left Arm, Cuff Size: Normal)   Pulse 83   Temp 98.1 F (36.7 C) (Temporal)   Ht 5\' 7"  (1.702  m)   Wt 206 lb 9.6 oz (93.7 kg)   SpO2 93%   BMI 32.36 kg/m    Review of Systems:  Gen:  Denies  fever, sweats, chills weight loss  HEENT: Denies blurred vision, double vision, ear pain, eye pain, hearing loss, nose bleeds, sore throat Cardiac:  No dizziness, chest pain or heaviness, chest tightness,edema, No JVD Resp:   No cough, -sputum production, +shortness of breath,+wheezing, -hemoptysis,  Other:  All other systems negative     Physical Examination:   General Appearance: No distress  EYES PERRLA, EOM intact.   NECK Supple, No JVD Pulmonary: normal breath sounds, + wheezing.  CardiovascularNormal S1,S2.  No m/r/g.   Abdomen: Benign, Soft, non-tender. ALL OTHER ROS ARE NEGATIVE         Assessment & Plan:   67 year old pleasant white female seen today for follow-up asthma  assessment and underlying severe sleep apnea oprevious diagnosis for stress-induced cardiomyopathy with a EF of 35% along with previous DX left lower lobe consolidation and pneumonia status post fall with left sided clavicular fracture and broken ribs  Asthma moderate persistent well-controlled at this time No evidence of exacerbation Continue Breo  as prescribed Albuterol as needed Albuterol nebs as needed Avoid secondhand smoke Avoid SICK contacts Recommend  Masking  when appropriate Recommend Keep up-to-date with vaccinations   OSA on CPAP Excellent control and compliance on CPAP.  Continue current settings Report reviewed in detail with patient  Encouraged proper weight management.  Important to get eight or more hours of sleep  Limiting the use of the computer and television before bedtime.  Decrease naps during the day, so night time sleep will become enhanced.  Limit caffeine, and sleep deprivation.  HTN, stroke, uncontrolled diabetes and heart failure are potential risk factors.  Risk of untreated sleep apnea including cardiac arrhthymias, stroke, DM, pulm HTN.   Diabetes Mellitis - Sleep apnea can contribute to DM, therefore treatment of sleep apnea is important part of DM management.  Atrial Fibrillation - Sleep apnea can contribute to Atrial Fibrillation, therefore treatment of sleep apnea is important part of A fib management.   Chronic Hypoxic resp failure due to COPD -Patient benefits from oxygen therapy  -recommend using oxygen as prescribed -patient needs this for survival   Stress-induced cardiomyopathy Appears euvolemic on exam.  Continue follow-up with cardiology   Physical deconditioned state Previous history of stress-induced cardiomyopathy Patient was referred to [facility] Advance activity as tolerated   CURRENT MEDICATIONS REVIEWED AT LENGTH WITH PATIENT TODAY   Patient  satisfied with Plan of action and management. All questions answered  Follow up 6 months  Total Time Spent  45 mins   Wallis Bamberg Kirsten [name], M.D.  [name]  Medical Director [facility] Medical Director [facility] Cardio-Pulmonary Department

## 2022-10-11 NOTE — Patient Instructions (Signed)
CT scans reviewed in detail Diagnosis of left lung pneumonia  Excellent job with CPAP A+ Continue CPAP as prescribed  Continue inhalers as prescribed Albuterol nebs as needed  Avoid secondhand smoke Avoid SICK contacts Recommend  Masking  when appropriate Recommend Keep up-to-date with vaccinations  Continue oxygen as prescribed with exertion Recommend portable oxygen concentrator Handicap sticker evaluation completed

## 2022-10-16 ENCOUNTER — Ambulatory Visit: Payer: Self-pay

## 2022-10-16 ENCOUNTER — Telehealth: Payer: Self-pay

## 2022-10-16 ENCOUNTER — Other Ambulatory Visit (HOSPITAL_COMMUNITY): Payer: Self-pay

## 2022-10-16 NOTE — Patient Outreach (Signed)
  Care Coordination   10/16/2022 Name: DENAYE ANSPACH MRN: [id number] DOB: 1955-09-17   Care Coordination Outreach Attempts:  An unsuccessful telephone outreach was attempted for a scheduled appointment today.  Follow Up Plan:  Additional outreach attempts will be made to offer the patient care coordination information and services.   Encounter Outcome:  No Answer   Care Coordination Interventions:  No, not indicated    Kathyrn Sheriff, RN, MSN, BSN, CCM Care Management Coordinator [phone]

## 2022-10-16 NOTE — Patient Outreach (Signed)
  Care Coordination   Initial Visit Note   10/16/2022 [name] MRN: [id number] DOB: 02-10-1956  Kirsten [name] is a 67 y.o. year old female who sees Kirsten Grandchild, MD for primary care. I spoke with  Kirsten [name] by phone today.  What matters to the patients health and wellness today?  Kirsten [name] reports she is feeling better overall since discharge from the hospital. Pulmonary office visit completed on 10/11/22. She reports she has all her medications except albuterol nebs, but reports she has the albuterol inhaler. She states she expects her pharmacy to have the nebulizer medication in today. She denies any questions at this time.   Goals Addressed             This Visit's Progress    Continue to improve post hospitalization       Interventions Today    Flowsheet Row Most Recent Value  Chronic Disease   Chronic disease during today's visit Diabetes, Other, Chronic Obstructive Pulmonary Disease (COPD)  [severe sepsis with pneumonia(10/02/22-10/07/22)]  General Interventions   General Interventions Discussed/Reviewed General Interventions Discussed, Doctor Visits  Doctor Visits Discussed/Reviewed Doctor Visits Discussed, PCP, Specialist  PCP/Specialist Visits Compliance with follow-up visit  [reviewed upcoming/scheduled appointments]  Education Interventions   Education Provided Provided Education  [facility] offered to provided educational materia-patient declined]  Provided Verbal Education On Blood Sugar Monitoring, Other, Development worker, community, When to see the doctor, Medication  [continue use meds/oxygen as prescribed,  check BS as recommended and notify provider if outside recommended range,  discussed when to call doctor if worsening condition.]  Pharmacy Interventions   Pharmacy Dicussed/Reviewed Pharmacy Topics Discussed  [medication review completed]            SDOH assessments and interventions completed:  Yes recently completed. Patient expresses no changes.    Care Coordination Interventions:  Yes, provided   Follow up plan: Follow up call scheduled for 11/28/22    Encounter Outcome:  Pt. Visit Completed   Kirsten Sheriff, RN, MSN, BSN, CCM Care Management Coordinator [phone]

## 2022-10-16 NOTE — Patient Instructions (Addendum)
Visit Information  Thank you for taking time to visit with me today. Please don't hesitate to contact me if I can be of assistance to you.   Following are the goals we discussed today:  Take medications as prescribed. Contact RNCM if you are not able to obtain your nebulizer medications from your pharmacy as planned Attend provider visits as scheduled/recommended Use Oxygen as prescribed Contact provider if any worsening of condition: increase SOB; sputum production: medications are not working including prn medication  Our next appointment is by telephone on 11/06/22 at 10:30 am.  Please call the care guide team at [phone] if you need to cancel or reschedule your appointment.   If you are experiencing a Mental Health or Behavioral Health Crisis or need someone to talk to, please call the Suicide and Crisis Lifeline: 988 call the [place] National Suicide Prevention Lifeline: 785-775-8315 or TTY: 2044909180 TTY 7070572940) to talk to a trained counselor call 1-800-273-TALK (toll free, 24 hour hotline)  Kathyrn Sheriff, RN, MSN, BSN, CCM Care Management Coordinator [phone]

## 2022-10-19 ENCOUNTER — Encounter: Payer: Self-pay | Admitting: Pulmonary Disease

## 2022-10-19 ENCOUNTER — Other Ambulatory Visit
Admission: RE | Admit: 2022-10-19 | Discharge: 2022-10-19 | Disposition: A | Discharge status: Home / Self Care | Payer: PPO | Source: Ambulatory Visit | Attending: Pulmonary Disease | Admitting: Pulmonary Disease

## 2022-10-19 ENCOUNTER — Ambulatory Visit: Payer: PPO | Admitting: Pulmonary Disease

## 2022-10-19 ENCOUNTER — Ambulatory Visit
Admission: RE | Admit: 2022-10-19 | Discharge: 2022-10-19 | Disposition: A | Discharge status: Home / Self Care | Payer: PPO | Source: Ambulatory Visit | Attending: Pulmonary Disease | Admitting: Pulmonary Disease

## 2022-10-19 VITALS — BP 124/78 | HR 78 | Temp 97.6°F | Ht 67.0 in | Wt 209.0 lb

## 2022-10-19 DIAGNOSIS — I517 Cardiomegaly: Secondary | ICD-10-CM | POA: Diagnosis not present

## 2022-10-19 DIAGNOSIS — R0781 Pleurodynia: Secondary | ICD-10-CM | POA: Diagnosis not present

## 2022-10-19 DIAGNOSIS — R918 Other nonspecific abnormal finding of lung field: Secondary | ICD-10-CM | POA: Diagnosis not present

## 2022-10-19 DIAGNOSIS — J984 Other disorders of lung: Secondary | ICD-10-CM | POA: Diagnosis not present

## 2022-10-19 LAB — CBC WITH DIFFERENTIAL/PLATELET
Abs Immature Granulocytes: 0.03 10*3/uL (ref 0.00–0.07)
Basophils Absolute: 0.1 10*3/uL (ref 0.0–0.1)
Basophils Relative: 1 %
Eosinophils Absolute: 0.2 10*3/uL (ref 0.0–0.5)
Eosinophils Relative: 3 %
HCT: 39.5 % (ref 36.0–46.0)
Hemoglobin: 12.5 g/dL (ref 12.0–15.0)
Immature Granulocytes: 0 %
Lymphocytes Relative: 34 %
Lymphs Abs: 3 10*3/uL (ref 0.7–4.0)
MCH: 29.1 pg (ref 26.0–34.0)
MCHC: 31.6 g/dL (ref 30.0–36.0)
MCV: 91.9 fL (ref 80.0–100.0)
Monocytes Absolute: 0.5 10*3/uL (ref 0.1–1.0)
Monocytes Relative: 5 %
Neutro Abs: 4.9 10*3/uL (ref 1.7–7.7)
Neutrophils Relative %: 57 %
Platelets: 261 10*3/uL (ref 150–400)
RBC: 4.3 MIL/uL (ref 3.87–5.11)
RDW: 14.3 % (ref 11.5–15.5)
WBC: 8.7 10*3/uL (ref 4.0–10.5)
nRBC: 0 % (ref 0.0–0.2)

## 2022-10-19 LAB — COMPREHENSIVE METABOLIC PANEL
ALT: 36 U/L (ref 0–44)
AST: 33 U/L (ref 15–41)
Albumin: 3.7 g/dL (ref 3.5–5.0)
Alkaline Phosphatase: 64 U/L (ref 38–126)
Anion gap: 11 (ref 5–15)
BUN: 8 mg/dL (ref 8–23)
CO2: 29 mmol/L (ref 22–32)
Calcium: 9.4 mg/dL (ref 8.9–10.3)
Chloride: 102 mmol/L (ref 98–111)
Creatinine, Ser: 0.79 mg/dL (ref 0.44–1.00)
GFR, Estimated: >60 mL/min (ref >60)
Glucose, Bld: 142 mg/dL — ABNORMAL HIGH (ref 70–99)
Potassium: 3.4 mmol/L — ABNORMAL LOW (ref 3.5–5.1)
Sodium: 142 mmol/L (ref 135–145)
Total Bilirubin: 0.5 mg/dL (ref 0.3–1.2)
Total Protein: 6.8 g/dL (ref 6.5–8.1)

## 2022-10-19 MED ORDER — LEVOFLOXACIN 500 MG PO TABS: 500.0000 mg | ORAL_TABLET | Freq: Every day | ORAL | 0 refills | Status: DC

## 2022-10-19 MED ORDER — PREDNISONE 10 MG PO TABS: ORAL_TABLET | ORAL | 0 refills | Status: AC

## 2022-10-19 NOTE — Patient Instructions (Signed)
Levaquin 500 mg daily for 7 days.  Prednisone 10 mg pill >> 3 pills daily for 2 days, 2 pills daily for 2 days, 1 pill daily for 2 days.  Follow up in 1 week.

## 2022-10-19 NOTE — Progress Notes (Addendum)
[facility]  [name] MRN: [id number] DOB: Jun 28, 1955  Chief Complaint  Patient presents with   Acute Visit    Increased SOB, occ wheezing and left sided back pain.  PNA 2 weeks ago.    Summary: 67 yr old female with COPD with asthma, obstructive sleep apnea, and chronic respiratory failure.  Subjective: She was in hospital from 10/02/22 to 10/07/22 with sepsis from Lt lobar pneumonia with Lt pleural effusion, acute on chronic HFpEF, and acute on chronic respiratory failure.  She was seen by Dr. Belia Heman on 10/11/22 and was feeling better.  She started feeling bad again about 5 days ago.  She is not feeling more fatigued and drained of energy.  She has pain on her left side that happens more with deep breathing, but feels like it is always there for the past few days.  She wasn't needing oxygen as much last week, but has been needing supplemental oxygen more over the past few days.  SpO2 in office on room air today 81%.  She doesn't have cough, wheeze, or chest congestion.  No GI symptoms.  Her legs are more swollen.  Past medical history: Takotsubo CM, PAF, CAD, HTN, DM type 2, Hypothyroidism, Anxiety, Depression, IBS, Omphalocele  Vital signs: BP 124/78 (BP Location: Left Arm, Cuff Size: Normal)   Pulse 78   Temp 97.6 F (36.4 C) (Temporal)   Ht 5\' 7"  (1.702 m)   Wt 209 lb (94.8 kg)   SpO2 98%   BMI 32.73 kg/m   Physical exam:  General - appears fatigued, wearing oxygen ENT - no sinus tenderness, no stridor Cardiac - regular rate/rhythm, no murmur Chest - decreased BS at bases with basilar crackles Lt > Rt Abdomen - soft, non tender, + bowel sounds Extremities - 1+ ankle edema Skin - no rashes Psych - normal mood and behavior  Labs:    Latest Ref Rng & Units 10/19/2022    2:40 PM 10/07/2022    5:01 AM 10/03/2022    5:28 AM  CMP  Glucose 70 - 99 mg/dL 409  811  914   BUN 8 - 23 mg/dL 8  17  12    Creatinine 0.44 - 1.00 mg/dL 7.82  9.56   2.13   Sodium 135 - 145 mmol/L 142  139  141   Potassium 3.5 - 5.1 mmol/L 3.4  3.4  4.3   Chloride 98 - 111 mmol/L 102  94  112   CO2 22 - 32 mmol/L 29  37  25   Calcium 8.9 - 10.3 mg/dL 9.4  9.4  9.1   Total Protein 6.5 - 8.1 g/dL 6.8     Total Bilirubin 0.3 - 1.2 mg/dL 0.5     Alkaline Phos 38 - 126 U/L 64     AST 15 - 41 U/L 33     ALT 0 - 44 U/L 36          Latest Ref Rng & Units 10/19/2022    2:40 PM 10/03/2022    5:28 AM 10/02/2022   10:14 AM  CBC  WBC 4.0 - 10.5 K/uL 8.7  5.9  7.7   Hemoglobin 12.0 - 15.0 g/dL 08.6  57.8  46.9   Hematocrit 36.0 - 46.0 % 39.5  39.1  40.3   Platelets 150 - 400 K/uL 261  187  202     DG Chest 2 View  Result Date: 10/19/2022 CLINICAL DATA:  Left-sided pleuritic chest pain recent  pneumonia EXAM: CHEST - 2 VIEW COMPARISON:  10/05/2022, CT 10/02/2022, radiograph 12/05/2021, 01/09/2021 FINDINGS: Improved aeration of left lung base compared to prior with mild residual airspace disease. Chronic volume loss and mild opacity at right base. enlarged cardiac size. No pneumothorax. IMPRESSION: 1. Improved aeration of left lung base with mild residual airspace disease. 2. Chronic volume loss and right base opacity. Electronically Signed   By: Jasmine Pang M.D.   On: 10/19/2022 16:19     Assessment/plan:  Left pleuritic chest pain after recent episode of pneumonia. - she is already on anticoagulation - will give course of levaquin 500 mg daily for 7 days (she has multiple antibiotic allergies which limit therapy options - will give a tapering course of prednisone  Chronic respiratory failure with hypoxia. - continue 2 liters oxygen 24/7 for now - reassess supplemental oxygen needs at next visit  Obstructive sleep apnea. - she is compliant with CPAP and reports benefit from therapy - she uses [employer] for her DME - current CPAP ordered February 2023 - continue CPAP 8 cm H2O  Moderate, persistent asthma. - continue breo 100 one puff daily - prn  albuterol - she has a nebulizer   Takotsubo cardiomyopathy, Paroxysmal atrial fibrillation. - followed by [facility]  Patient Instructions  Levaquin 500 mg daily for 7 days.  Prednisone 10 mg pill >> 3 pills daily for 2 days, 2 pills daily for 2 days, 1 pill daily for 2 days.  Follow up in 1 week.  Allergies as of 10/19/2022       Reactions   Doxycycline Rash   Factive [gemifloxacin Mesylate] Rash   Crestor [rosuvastatin]    GI upset   Sulfonamide Derivatives    REACTION: rash   Gemifloxacin Rash   Penicillins Hives, Rash   Has patient had a PCN reaction causing immediate rash, facial/tongue/throat swelling, SOB or lightheadedness with hypotension: No Has patient had a PCN reaction causing severe rash involving mucus membranes or skin necrosis: No Has patient had a PCN reaction that required hospitalization No Has patient had a PCN reaction occurring within the last 10 years: No If all of the above answers are "NO", then may proceed with Cephalosporin use.        Medication List        Accurate as of October 19, 2022  4:23 PM. If you have any questions, ask your nurse or doctor.          albuterol 108 (90 Base) MCG/ACT inhaler Commonly known as: VENTOLIN HFA Inhale 2 puffs into the lungs every 6 (six) hours as needed for wheezing or shortness of breath.   albuterol (2.5 MG/3ML) 0.083% nebulizer solution Commonly known as: PROVENTIL Take 3 mLs (2.5 mg total) by nebulization every 4 (four) hours as needed for wheezing or shortness of breath.   ALPRAZolam 0.25 MG tablet Commonly known as: XANAX Take 1 tablet (0.25 mg total) by mouth 3 (three) times daily as needed for anxiety.   amiodarone 200 MG tablet Commonly known as: PACERONE Take 1 tablet (200 mg total) by mouth daily.   atorvastatin 40 MG tablet Commonly known as: LIPITOR TAKE ONE TABLET BY MOUTH ONE TIME DAILY   cyanocobalamin 1000 MCG/ML injection Commonly known as: VITAMIN B12 INJECT  INTO THE MUSCLE ONCE A MONTH   diclofenac Sodium 1 % Gel Commonly known as: VOLTAREN Apply 4 g topically 4 (four) times daily.   Eliquis 5 MG Tabs tablet Generic drug: apixaban TAKE ONE TABLET BY MOUTH  TWICE A DAY   fluticasone 50 MCG/ACT nasal spray Commonly known as: FLONASE Place 1 spray into both nostrils daily.   fluticasone furoate-vilanterol 100-25 MCG/ACT Aepb Commonly known as: Breo Ellipta Inhale 1 puff into the lungs daily.   gabapentin 100 MG capsule Commonly known as: NEURONTIN Take 3 capsules (300 mg total) by mouth 3 (three) times daily. Take 100 mg by mouth 3 times a day. May add 100 mg every 3 to 5 days until taking 200 mg 3 times daily.   Lasix 20 MG tablet Generic drug: furosemide Take 20 mg by mouth daily.   levofloxacin 500 MG tablet Commonly known as: LEVAQUIN Take 1 tablet (500 mg total) by mouth daily. Started by: Coralyn Helling   levothyroxine 175 MCG tablet Commonly known as: SYNTHROID Take 1 tablet (175 mcg total) by mouth daily.   losartan 25 MG tablet Commonly known as: COZAAR Take 1 tablet (25 mg total) by mouth daily.   metFORMIN 500 MG 24 hr tablet Commonly known as: GLUCOPHAGE-XR Take 1 tablet (500 mg total) by mouth 2 (two) times daily with a meal.   metoprolol succinate 25 MG 24 hr tablet Commonly known as: TOPROL-XL Take 0.5 tablets (12.5 mg total) by mouth daily.   PARoxetine 25 MG 24 hr tablet Commonly known as: PAXIL-CR TAKE ONE TABLET BY MOUTH ONE TIME DAILY   predniSONE 10 MG tablet Commonly known as: DELTASONE Take 3 tablets (30 mg total) by mouth daily with breakfast for 2 days, THEN 2 tablets (20 mg total) daily with breakfast for 2 days, THEN 1 tablet (10 mg total) daily with breakfast for 2 days. Start taking on: October 19, 2022 Started by: Coralyn Helling        Time spent: 51 minutes  Signature: Coralyn Helling, MD [facility]monary[facility]Critical [facility] Pager - [phone] 10/19/2022, 4:23 PM

## 2022-10-22 NOTE — Group Note (Deleted)

## 2022-10-23 ENCOUNTER — Encounter: Payer: Self-pay | Admitting: Internal Medicine

## 2022-10-23 ENCOUNTER — Ambulatory Visit: Payer: PPO | Admitting: Internal Medicine

## 2022-10-23 VITALS — BP 122/74 | HR 85 | Temp 98.1°F | Resp 16 | Ht 67.0 in | Wt 205.0 lb

## 2022-10-23 DIAGNOSIS — J9611 Chronic respiratory failure with hypoxia: Secondary | ICD-10-CM | POA: Diagnosis not present

## 2022-10-23 DIAGNOSIS — R3915 Urgency of urination: Secondary | ICD-10-CM | POA: Diagnosis not present

## 2022-10-23 DIAGNOSIS — I10 Essential (primary) hypertension: Secondary | ICD-10-CM

## 2022-10-23 DIAGNOSIS — Z7984 Long term (current) use of oral hypoglycemic drugs: Secondary | ICD-10-CM

## 2022-10-23 DIAGNOSIS — B9620 Unspecified Escherichia coli [E. coli] as the cause of diseases classified elsewhere: Secondary | ICD-10-CM

## 2022-10-23 DIAGNOSIS — E876 Hypokalemia: Secondary | ICD-10-CM | POA: Diagnosis not present

## 2022-10-23 DIAGNOSIS — R300 Dysuria: Secondary | ICD-10-CM | POA: Diagnosis not present

## 2022-10-23 DIAGNOSIS — E118 Type 2 diabetes mellitus with unspecified complications: Secondary | ICD-10-CM

## 2022-10-23 DIAGNOSIS — J8410 Pulmonary fibrosis, unspecified: Secondary | ICD-10-CM | POA: Diagnosis not present

## 2022-10-23 DIAGNOSIS — T502X5A Adverse effect of carbonic-anhydrase inhibitors, benzothiadiazides and other diuretics, initial encounter: Secondary | ICD-10-CM | POA: Diagnosis not present

## 2022-10-23 DIAGNOSIS — N390 Urinary tract infection, site not specified: Secondary | ICD-10-CM | POA: Diagnosis not present

## 2022-10-23 LAB — MICROALBUMIN / CREATININE URINE RATIO
Creatinine,U: 21.9 mg/dL
Microalb Creat Ratio: 3.2 mg/g (ref 0.0–30.0)
Microalb, Ur: <0.7 mg/dL (ref 0.0–1.9)

## 2022-10-23 LAB — URINALYSIS, ROUTINE W REFLEX MICROSCOPIC
Bilirubin Urine: NEGATIVE
Hgb urine dipstick: NEGATIVE
Ketones, ur: NEGATIVE
Nitrite: NEGATIVE
Specific Gravity, Urine: 1.01 (ref 1.000–1.030)
Total Protein, Urine: NEGATIVE
Urine Glucose: NEGATIVE
Urobilinogen, UA: 0.2 (ref 0.0–1.0)
pH: 6 (ref 5.0–8.0)

## 2022-10-23 LAB — BASIC METABOLIC PANEL
BUN: 19 mg/dL (ref 6–23)
CO2: 29 meq/L (ref 19–32)
Calcium: 10.5 mg/dL (ref 8.4–10.5)
Chloride: 99 meq/L (ref 96–112)
Creatinine, Ser: 0.92 mg/dL (ref 0.40–1.20)
GFR: 65.47 mL/min (ref >60.00)
Glucose, Bld: 254 mg/dL — ABNORMAL HIGH (ref 70–99)
Potassium: 3.6 meq/L (ref 3.5–5.1)
Sodium: 141 meq/L (ref 135–145)

## 2022-10-23 MED ORDER — METFORMIN HCL ER 750 MG PO TB24
1500.0000 mg | ORAL_TABLET | Freq: Every day | ORAL | 1 refills | Status: DC
Start: 2022-10-23 — End: 2023-03-24

## 2022-10-23 NOTE — Progress Notes (Signed)
Subjective:  Patient ID: Kirsten [name], female    DOB: 12-25-1955  Age: 67 y.o. MRN: [id number]  CC: Asthma and Diabetes   HPI Kirsten [name] presents for f/up ---  Discussed the use of AI scribe software for clinical note transcription with the patient, who gave verbal consent to proceed.  History of Present Illness   The patient, currently on Levaquin and prednisone, reports feeling better and denies any residual symptoms of pneumonia such as coughing, wheezing, shortness of breath, fever, chills, night sweats, or chest pain. They mention a recent hospitalization but do not elaborate on the reason for admission. They are also on an inhaler, initially Breo, but switched due to insurance coverage issues.  The patient has a history of urinary tract infections (UTIs) and is currently experiencing urinary symptoms, which they are unsure if they are related to their medications or a new UTI. They describe their urine as "thick" and report urgency and occasional pain during urination, but deny any abdominal or pelvic pain, hematuria, fever, chills, or night sweats.  They also express a need to check their potassium levels, but do not provide further context. They have not yet received a flu shot this year, waiting until they feel better. They had an eye exam in June of this year with no reported issues. They also report no current leg swelling.  The patient's hemoglobin was around 12 at their last check, and they express concern about potential anemia. They also mention concerns about their blood sugars, possibly related to a recent illness, and request an increase in their metformin dosage from 500mg  to 1000mg  twice daily. The provider suggests a compromise of 1500mg  daily.  Lastly, the patient mentions a high cost for their Breo inhaler, at $350, and inquires about samples or a less expensive alternative. The pharmacy has already made a change for them, but the specific alternative is not  mentioned.       Patient: Kirsten [name] MRN: [id number] DOB: May 10, 1955  Admit date:     10/02/2022  Discharge date: 10/07/22  Discharge Physician: Lurene Shadow    PCP: Etta Grandchild, MD    Recommendations at discharge:    Follow-up with PCP in 1 week Follow-up with pulmonologist and cardiologist as an outpatient   Discharge Diagnoses: Principal Problem:   Severe sepsis (HCC) Active Problems:   Asthma, mild persistent   Acute hypoxic respiratory failure (HCC)   Depression with anxiety   OSA on CPAP   Hypothyroidism   Essential hypertension, benign   Paroxysmal atrial fibrillation (HCC)   Type II diabetes mellitus with manifestations (HCC)   Hyperlipidemia with target LDL less than 100   GAD (generalized anxiety disorder)   NASH (nonalcoholic steatohepatitis)   Primary osteoarthritis of right hip   CAP (community acquired pneumonia)   Shortness of breath   Acute on chronic diastolic CHF (congestive heart failure) (HCC)   Resolved Problems:   * No resolved hospital problems. *   Hospital Course:   Kirsten [name] is a 67 y.o. 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  She presented to the hospital because of shortness of breath that is worse with exertion.   Vitals in the ED showed temperature of 98.7, respiration rate of 20, heart rate of 92, blood pressure 170/103, SpO2 of 93% on  room air.  Oxygen saturation dropped to 86% on room air with ambulation.   Serum sodium is 136, potassium 3.8, chloride 103, bicarb 25, BUN of 14, serum creatinine of 0.81, nonfasting blood glucose 218, WBC 7.7, hemoglobin 12.9, platelets of 202.   Chest x-ray 2 views: Read as hazy opacity at the left lung base could represent  atelectasis or infection.  Possible trace right pleural effusion.   ED treatment: DuoNebs one-time treatment, Toradol 15 mg IV one-time dose, Solu-Medrol, cefepime, azithromycin 500 mg IV, sodium chloride 1 L bolus.  Outpatient Medications Prior to Visit  Medication Sig Dispense Refill   albuterol (PROVENTIL) (2.5 MG/3ML) 0.083% nebulizer solution Take 3 mLs (2.5 mg total) by nebulization every 4 (four) hours as needed for wheezing or shortness of breath. 75 mL 10   albuterol (VENTOLIN HFA) 108 (90 Base) MCG/ACT inhaler Inhale 2 puffs into the lungs every 6 (six) hours as needed for wheezing or shortness of breath. 8 g 2   ALPRAZolam (XANAX) 0.25 MG tablet Take 1 tablet (0.25 mg total) by mouth 3 (three) times daily as needed for anxiety. 270 tablet 0   amiodarone (PACERONE) 200 MG tablet Take 1 tablet (200 mg total) by mouth daily. 90 tablet 0   atorvastatin (LIPITOR) 40 MG tablet TAKE ONE TABLET BY MOUTH ONE TIME DAILY 90 tablet 1   cyanocobalamin (,VITAMIN B-12,) 1000 MCG/ML injection INJECT INTO THE MUSCLE ONCE A MONTH 10 mL 0   diclofenac Sodium (VOLTAREN) 1 % GEL Apply 4 g topically 4 (four) times daily. 350 g 1   ELIQUIS 5 MG TABS tablet TAKE ONE TABLET BY MOUTH TWICE A DAY 180 tablet 1   fluticasone (FLONASE) 50 MCG/ACT nasal spray Place 1 spray into both nostrils daily. 18 mL 2   fluticasone furoate-vilanterol (BREO ELLIPTA) 100-25 MCG/ACT AEPB Inhale 1 puff into the lungs daily. 30 each 5   furosemide (LASIX) 20 MG tablet Take 20 mg by mouth daily.     gabapentin (NEURONTIN) 100 MG capsule Take 3 capsules (300 mg total) by mouth 3 (three) times daily. Take 100 mg by mouth 3 times a day. May add 100 mg every 3 to 5 days until taking 200 mg 3 times daily.     levofloxacin (LEVAQUIN) 500 MG tablet Take 1 tablet (500 mg total) by mouth daily. 7 tablet 0   levothyroxine (SYNTHROID) 175 MCG tablet Take 1 tablet (175 mcg total) by mouth daily. 90 tablet 0   PARoxetine (PAXIL-CR) 25 MG 24  hr tablet TAKE ONE TABLET BY MOUTH ONE TIME DAILY 90 tablet 1   predniSONE (DELTASONE) 10 MG tablet Take 3 tablets (30 mg total) by mouth daily with breakfast for 2 days, THEN 2 tablets (20 mg total) daily with breakfast for 2 days, THEN 1 tablet (10 mg total) daily with breakfast for 2 days. 12 tablet 0   metFORMIN (GLUCOPHAGE-XR) 500 MG 24 hr tablet Take 1 tablet (500 mg total) by mouth 2 (two) times daily with a meal. 180 tablet 0   losartan (COZAAR) 25 MG tablet Take 1 tablet (25 mg total) by mouth daily. 30 tablet 0   metoprolol succinate (TOPROL-XL) 25 MG 24 hr tablet Take 0.5 tablets (12.5 mg total) by mouth daily. 15 tablet 0   No facility-administered medications prior to visit.    ROS Review of Systems  Constitutional:  Negative for appetite change, chills, diaphoresis, fatigue and unexpected weight change.  HENT: Negative.    Respiratory:  Negative for chest  tightness, shortness of breath and wheezing.   Cardiovascular:  Negative for chest pain and palpitations.  Gastrointestinal:  Negative for abdominal pain, constipation, diarrhea, nausea and vomiting.  Genitourinary:  Positive for dysuria and urgency. Negative for difficulty urinating, flank pain and hematuria.  Musculoskeletal: Negative.  Negative for arthralgias.  Skin: Negative.   Neurological:  Negative for dizziness and weakness.  Hematological:  Negative for adenopathy. Does not bruise/bleed easily.  Psychiatric/Behavioral: Negative.      Objective:  BP 122/74 (BP Location: Left Arm, Patient Position: Sitting, Cuff Size: Large)   Pulse 85   Temp 98.1 F (36.7 C) (Oral)   Resp 16   Ht 5\' 7"  (1.702 m)   Wt 205 lb (93 kg)   SpO2 93%   BMI 32.11 kg/m   BP Readings from Last 3 Encounters:  10/25/22 130/80  10/23/22 122/74  10/19/22 124/78    Wt Readings from Last 3 Encounters:  10/25/22 206 lb (93.4 kg)  10/23/22 205 lb (93 kg)  10/19/22 209 lb (94.8 kg)    Physical Exam Vitals reviewed.   Constitutional:      Appearance: Normal appearance. She is not ill-appearing.  HENT:     Mouth/Throat:     Mouth: Mucous membranes are moist.  Eyes:     General: No scleral icterus.    Conjunctiva/sclera: Conjunctivae normal.  Cardiovascular:     Rate and Rhythm: Normal rate and regular rhythm.     Heart sounds: No murmur heard. Pulmonary:     Effort: Pulmonary effort is normal.     Breath sounds: No stridor. No wheezing, rhonchi or rales.  Abdominal:     General: Abdomen is flat.     Palpations: There is no mass.     Tenderness: There is no abdominal tenderness. There is no guarding.     Hernia: No hernia is present.  Musculoskeletal:        General: Normal range of motion.     Cervical back: Neck supple.     Right lower leg: No edema.     Left lower leg: No edema.  Lymphadenopathy:     Cervical: No cervical adenopathy.  Skin:    General: Skin is warm and dry.     Coloration: Skin is not pale.  Neurological:     General: No focal deficit present.     Mental Status: She is alert. Mental status is at baseline.  Psychiatric:        Mood and Affect: Mood normal.        Behavior: Behavior normal.     Lab Results  Component Value Date   WBC 8.7 10/19/2022   HGB 12.5 10/19/2022   HCT 39.5 10/19/2022   PLT 261 10/19/2022   GLUCOSE 254 (H) 10/23/2022   CHOL 171 09/04/2022   TRIG 257.0 (H) 09/04/2022   HDL 55.40 09/04/2022   LDLDIRECT 86.0 09/04/2022   LDLCALC 32 09/30/2021   ALT 36 10/19/2022   AST 33 10/19/2022   NA 141 10/23/2022   K 3.6 10/23/2022   CL 99 10/23/2022   CREATININE 0.92 10/23/2022   BUN 19 10/23/2022   CO2 29 10/23/2022   TSH 1.56 09/04/2022   INR 1.3 (H) 04/16/2022   HGBA1C 7.2 (H) 09/04/2022   MICROALBUR <0.7 10/23/2022    DG Chest 2 View  Result Date: 10/19/2022 CLINICAL DATA:  Left-sided pleuritic chest pain recent pneumonia EXAM: CHEST - 2 VIEW COMPARISON:  10/05/2022, CT 10/02/2022, radiograph 12/05/2021, 01/09/2021 FINDINGS: Improved  aeration  of left lung base compared to prior with mild residual airspace disease. Chronic volume loss and mild opacity at right base. enlarged cardiac size. No pneumothorax. IMPRESSION: 1. Improved aeration of left lung base with mild residual airspace disease. 2. Chronic volume loss and right base opacity. Electronically Signed   By: Jasmine Pang M.D.   On: 10/19/2022 16:19    Assessment & Plan:   Type II diabetes mellitus with manifestations (HCC)- Her blood sugar is 254.  Will increase the metformin dosage. -     metFORMIN HCl ER; Take 2 tablets (1,500 mg total) by mouth daily with breakfast.  Dispense: 180 tablet; Refill: 1 -     Microalbumin / creatinine urine ratio; Future -     Basic metabolic panel; Future  Essential hypertension, benign- Her blood pressure is well-controlled. -     Urinalysis, Routine w reflex microscopic; Future -     Microalbumin / creatinine urine ratio; Future -     Basic metabolic panel; Future  Diuretic-induced hypokalemia -     Basic metabolic panel; Future  Dysuria -     Urinalysis, Routine w reflex microscopic; Future -     CULTURE, URINE COMPREHENSIVE; Future  Urinary urgency -     Urinalysis, Routine w reflex microscopic; Future -     CULTURE, URINE COMPREHENSIVE; Future  E. coli UTI (urinary tract infection)- This is resistant to fluoroquinolones.  Will treat with nitrofurantoin. -     Nitrofurantoin Monohyd Macro; Take 1 capsule (100 mg total) by mouth 2 (two) times daily for 5 days.  Dispense: 10 capsule; Refill: 0     Follow-up: Return in about 4 months (around 02/22/2023).  Sanda Linger, MD

## 2022-10-23 NOTE — Patient Instructions (Signed)

## 2022-10-24 DIAGNOSIS — Z23 Encounter for immunization: Secondary | ICD-10-CM | POA: Diagnosis not present

## 2022-10-24 DIAGNOSIS — I5033 Acute on chronic diastolic (congestive) heart failure: Secondary | ICD-10-CM | POA: Diagnosis not present

## 2022-10-24 DIAGNOSIS — I480 Paroxysmal atrial fibrillation: Secondary | ICD-10-CM | POA: Diagnosis not present

## 2022-10-24 DIAGNOSIS — E119 Type 2 diabetes mellitus without complications: Secondary | ICD-10-CM | POA: Diagnosis not present

## 2022-10-24 DIAGNOSIS — I5181 Takotsubo syndrome: Secondary | ICD-10-CM | POA: Diagnosis not present

## 2022-10-25 ENCOUNTER — Ambulatory Visit: Payer: PPO | Admitting: Student in an Organized Health Care Education/Training Program

## 2022-10-25 ENCOUNTER — Encounter: Payer: Self-pay | Admitting: Student in an Organized Health Care Education/Training Program

## 2022-10-25 VITALS — BP 130/80 | HR 71 | Temp 98.1°F | Ht 67.0 in | Wt 206.0 lb

## 2022-10-25 DIAGNOSIS — J4540 Moderate persistent asthma, uncomplicated: Secondary | ICD-10-CM

## 2022-10-25 DIAGNOSIS — G4733 Obstructive sleep apnea (adult) (pediatric): Secondary | ICD-10-CM | POA: Diagnosis not present

## 2022-10-25 DIAGNOSIS — R0609 Other forms of dyspnea: Secondary | ICD-10-CM

## 2022-10-25 DIAGNOSIS — J4521 Mild intermittent asthma with (acute) exacerbation: Secondary | ICD-10-CM

## 2022-10-25 NOTE — Progress Notes (Signed)
Assessment & Plan:   #Asthma Exacerbation #Chronic Respiratory Failure with Hypoxia - resolving #Recent left lower lobe pneumonia #OSA  67 year old female with a history of asthma and chronic hypoxic respiratory failure who presents today for short term follow up. She was seen last week for symptoms of shortness of breath, cough, and pleurisy concerning for asthma exacerbation. She was prescribed antibiotics and prednisone with improvement. On exam today, her lungs are clear and she has no wheeze or rales. CXR performed prior to last week's visit was unchanged compared to prior. The patient has improved dramatically with a course of prednisone and steroids with last week's presentation consistent with asthma exacerbation. She had a recent left lower lobe pneumonia and would benefit from repeat imaging in the future, though would wait 3 months for radiographic improvement prior to repeat imaging. Patient will follow up in 2-3 months with Dr. Belia Heman with PFT's at which point a decision can be made regarding repeat imaging.  -continue Breo Ellipta one puff once daily -continue albuterol PRN -continue CPAP as previously ordered  Return in about 3 months (around 01/24/2023). Follow up with be with Dr. Belia Heman.  I spent 30 minutes caring for this patient today, including preparing to see the patient, obtaining a medical history , reviewing a separately obtained history, performing a medically appropriate examination and/or evaluation, counseling and educating the patient/family/caregiver, documenting clinical information in the electronic health record, and independently interpreting results (not separately reported/billed) and communicating results to the patient/family/caregiver  Raechel Chute, MD [facility]   End of visit medications:  No orders of the defined types were placed in this encounter.    Current Outpatient Medications:    albuterol (PROVENTIL) (2.5 MG/3ML)  0.083% nebulizer solution, Take 3 mLs (2.5 mg total) by nebulization every 4 (four) hours as needed for wheezing or shortness of breath., Disp: 75 mL, Rfl: 10   albuterol (VENTOLIN HFA) 108 (90 Base) MCG/ACT inhaler, Inhale 2 puffs into the lungs every 6 (six) hours as needed for wheezing or shortness of breath., Disp: 8 g, Rfl: 2   ALPRAZolam (XANAX) 0.25 MG tablet, Take 1 tablet (0.25 mg total) by mouth 3 (three) times daily as needed for anxiety., Disp: 270 tablet, Rfl: 0   amiodarone (PACERONE) 200 MG tablet, Take 1 tablet (200 mg total) by mouth daily., Disp: 90 tablet, Rfl: 0   atorvastatin (LIPITOR) 40 MG tablet, TAKE ONE TABLET BY MOUTH ONE TIME DAILY, Disp: 90 tablet, Rfl: 1   cyanocobalamin (,VITAMIN B-12,) 1000 MCG/ML injection, INJECT INTO THE MUSCLE ONCE A MONTH, Disp: 10 mL, Rfl: 0   diclofenac Sodium (VOLTAREN) 1 % GEL, Apply 4 g topically 4 (four) times daily., Disp: 350 g, Rfl: 1   ELIQUIS 5 MG TABS tablet, TAKE ONE TABLET BY MOUTH TWICE A DAY, Disp: 180 tablet, Rfl: 1   fluticasone (FLONASE) 50 MCG/ACT nasal spray, Place 1 spray into both nostrils daily., Disp: 18 mL, Rfl: 2   fluticasone furoate-vilanterol (BREO ELLIPTA) 100-25 MCG/ACT AEPB, Inhale 1 puff into the lungs daily., Disp: 30 each, Rfl: 5   furosemide (LASIX) 20 MG tablet, Take 20 mg by mouth daily., Disp: , Rfl:    gabapentin (NEURONTIN) 100 MG capsule, Take 3 capsules (300 mg total) by mouth 3 (three) times daily. Take 100 mg by mouth 3 times a day. May add 100 mg every 3 to 5 days until taking 200 mg 3 times daily., Disp: , Rfl:    levofloxacin (  LEVAQUIN) 500 MG tablet, Take 1 tablet (500 mg total) by mouth daily., Disp: 7 tablet, Rfl: 0   levothyroxine (SYNTHROID) 175 MCG tablet, Take 1 tablet (175 mcg total) by mouth daily., Disp: 90 tablet, Rfl: 0   metFORMIN (GLUCOPHAGE-XR) 750 MG 24 hr tablet, Take 2 tablets (1,500 mg total) by mouth daily with breakfast., Disp: 180 tablet, Rfl: 1   PARoxetine (PAXIL-CR) 25 MG  24 hr tablet, TAKE ONE TABLET BY MOUTH ONE TIME DAILY, Disp: 90 tablet, Rfl: 1   predniSONE (DELTASONE) 10 MG tablet, Take 3 tablets (30 mg total) by mouth daily with breakfast for 2 days, THEN 2 tablets (20 mg total) daily with breakfast for 2 days, THEN 1 tablet (10 mg total) daily with breakfast for 2 days., Disp: 12 tablet, Rfl: 0   losartan (COZAAR) 25 MG tablet, Take 1 tablet (25 mg total) by mouth daily., Disp: 30 tablet, Rfl: 0   metoprolol succinate (TOPROL-XL) 25 MG 24 hr tablet, Take 0.5 tablets (12.5 mg total) by mouth daily., Disp: 15 tablet, Rfl: 0   Subjective:   PATIENT ID: Kirsten [name] GENDER: female DOB: 1955-11-01, MRN: [id number]  Chief Complaint  Patient presents with   Follow-up    Patient denies any cough, SOB or wheezing.     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  She was in hospital from 10/02/22 to 10/07/22 with sepsis from Lt lobar pneumonia with Lt pleural effusion, acute on chronic HFpEF, and acute on chronic respiratory failure.  She was seen by Dr. Belia Heman on 10/11/22 and was feeling better. She was seen again on 10/19/2022 by Dr. Coralyn Helling with increased fatigue, shortness of breath, and left sided pleuritic chest pain. She was found to be hypoxic to 81% on room air in clinic at last week's visit. She was given a course of antibiotics (levofloxacin) and a short prednisone taper. She is presenting for short term follow up today. Chest xray on 10/19/2022 showed improved aeration of the left lung base with mild residual airspace disease and chronic volume loss with right base opacity.   Today, she feels much better compared to last week. Her shortness of breath, wheeze, chest tightness, and pleurisy are resolved. She has not felt the need for oxygen since Tuesday, and discontinued it. She says her oxygen levels hover around 94-95% on room air now.   Past medical  history: Takotsubo CM, PAF, CAD, asthma, chronic hypoxic respiratory failure, HTN, DM type 2, Hypothyroidism, Anxiety, Depression, IBS, Omphalocele.  Ancillary information including prior medications, full medical/surgical/family/social histories, and PFTs (when available) are listed below and have been reviewed.   Review of Systems  Constitutional:  Negative for chills, fever, malaise/fatigue and weight loss.  Respiratory:  Negative for cough, hemoptysis, sputum production, shortness of breath and wheezing.   Cardiovascular:  Negative for chest pain.     Objective:   Vitals:   10/25/22 1048  BP: 130/80  Pulse: 71  Temp: 98.1 F (36.7 C)  TempSrc: Temporal  SpO2: 95%  Weight: 206 lb (93.4 kg)  Height: 5\' 7"  (1.702 m)   95% on RA. Trended in the office on room air and maintained 95% oxygenation.   BMI Readings from Last 3 Encounters:  10/25/22 32.26 kg/m  10/23/22 32.11 kg/m  10/19/22 32.73 kg/m   Wt Readings from Last 3 Encounters:  10/25/22 206 lb (93.4 kg)  10/23/22 205 lb (93  kg)  10/19/22 209 lb (94.8 kg)    Physical Exam Constitutional:      Appearance: Normal appearance. She is obese.  Cardiovascular:     Rate and Rhythm: Normal rate. Rhythm irregular.     Pulses: Normal pulses.     Heart sounds: Normal heart sounds.  Pulmonary:     Effort: Pulmonary effort is normal.     Breath sounds: Normal breath sounds. No wheezing or rales.  Neurological:     General: No focal deficit present.     Mental Status: She is alert and oriented to person, place, and time. Mental status is at baseline.       Ancillary Information    Past Medical History:  Diagnosis Date   A-fib (HCC)    Anxiety disorder    Asthma    Dyspnea    Gastroschisis    umphalocele, rotated organs abd repair until age 67   GERD (gastroesophageal reflux disease)    HTN (hypertension)    Hyperlipidemia    IBS (irritable bowel syndrome)    Morbid obesity (HCC)    Target wt - 185  for  BMI < 30   Obesity    OSA on CPAP    SBO (small bowel obstruction) (HCC)    Resolved with NG/Bowel rest around 2009   Sleep apnea    Type II or unspecified type diabetes mellitus without mention of complication, not stated as uncontrolled      Family History  Problem Relation Age of Onset   Lymphoma Mother    Diabetes type II Father    Colon cancer Father    Diabetes Maternal Grandmother    Diabetes type I Son    Goiter Neg Hx    Breast cancer Neg Hx    Ovarian cancer Neg Hx      Past Surgical History:  Procedure Laterality Date   BREAST EXCISIONAL BIOPSY Left 03/19/2012   neg   CESAREAN SECTION     CHOLECYSTECTOMY  1992   COLONOSCOPY  2011   2011-normal   Newborn Surgery - GI - ORGANS OUTSIDE ABDOMEN     RIGHT/LEFT HEART CATH AND CORONARY ANGIOGRAPHY N/A 10/02/2021   Procedure: RIGHT/LEFT HEART CATH AND CORONARY ANGIOGRAPHY;  Surgeon: Marcina Millard, MD;  Location: [facility] INVASIVE CV LAB;  Service: Cardiovascular;  Laterality: N/A;   Small Bowel Repair     TUBAL LIGATION  1988    Social History   Socioeconomic History   Marital status: Married    Spouse name: Not on file   Number of children: 3   Years of education: Not on file   Highest education level: Not on file  Occupational History   Occupation: Retired    Comment: Receptionist at [employer]  Tobacco Use   Smoking status: Never   Smokeless tobacco: Never   Tobacco comments:    "tried as a 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 developer   Vaping status: Never Used  Substance and Sexual Activity   Alcohol use: Yes    Alcohol/week: 2.0 standard drinks of alcohol    Types: 2 Glasses of wine per week    Comment: rare   Drug use: No   Sexual activity: Yes    Birth control/protection: Surgical  Other Topics Concern   Not on file  Social History Narrative   Regular Exercise -  YES   Daily Caffeine Use:  2   Active and independent at baseline   Right handed   Lives in a one story  home      Social Determinants of  Health   Financial Resource Strain: Low Risk  (09/03/2022)   Overall Financial Resource Strain (CARDIA)    Difficulty of Paying Living Expenses: Not very hard  Food Insecurity: No Food Insecurity (10/09/2022)   Hunger Vital Sign    Worried About Running Out of Food in the Last Year: Never true    Ran Out of Food in the Last Year: Never true  Transportation Needs: No Transportation Needs (10/09/2022)   PRAPARE - Administrator, Civil Service (Medical): No    Lack of Transportation (Non-Medical): No  Physical Activity: Insufficiently Active (09/03/2022)   Exercise Vital Sign    Days of Exercise per Week: 4 days    Minutes of Exercise per Session: 20 min  Stress: No Stress Concern Present (09/03/2022)   [employer] of Occupational Health - Occupational Stress Questionnaire    Feeling of Stress : Not at all  Social Connections: Moderately Integrated (09/03/2022)   Social Connection and Isolation Panel [NHANES]    Frequency of Communication with Friends and Family: More than three times a week    Frequency of Social Gatherings with Friends and Family: Once a week    Attends Religious Services: Never    Database administrator or Organizations: Yes    Attends Engineer, structural: More than 4 times per year    Marital Status: Married  Catering manager Violence: Not At Risk (10/02/2022)   Humiliation, Afraid, Rape, and Kick questionnaire    Fear of Current or Ex-Partner: No    Emotionally Abused: No    Physically Abused: No    Sexually Abused: No     Allergies  Allergen Reactions   Doxycycline Rash   Factive [Gemifloxacin Mesylate] Rash   Crestor [Rosuvastatin]     GI upset   Sulfonamide Derivatives     REACTION: rash   Gemifloxacin Rash   Penicillins Hives and Rash    Has patient had a PCN reaction causing immediate rash, facial/tongue/throat swelling, SOB or lightheadedness with hypotension: No Has patient had a PCN reaction causing severe rash involving  mucus membranes or skin necrosis: No Has patient had a PCN reaction that required hospitalization No Has patient had a PCN reaction occurring within the last 10 years: No If all of the above answers are "NO", then may proceed with Cephalosporin use.      CBC    Component Value Date/Time   WBC 8.7 10/19/2022 1440   RBC 4.30 10/19/2022 1440   HGB 12.5 10/19/2022 1440   HGB 13.7 10/21/2015 1433   HCT 39.5 10/19/2022 1440   HCT 40.7 10/21/2015 1433   PLT 261 10/19/2022 1440   PLT 335 10/21/2015 1433   MCV 91.9 10/19/2022 1440   MCV 91 10/21/2015 1433   MCV 88 11/27/2013 0426   MCH 29.1 10/19/2022 1440   MCHC 31.6 10/19/2022 1440   RDW 14.3 10/19/2022 1440   RDW 14.5 10/21/2015 1433   RDW 13.7 11/27/2013 0426   LYMPHSABS 3.0 10/19/2022 1440   LYMPHSABS 3.5 (H) 10/21/2015 1433   LYMPHSABS 3.7 (H) 11/27/2013 0426   MONOABS 0.5 10/19/2022 1440   MONOABS 0.6 11/27/2013 0426   EOSABS 0.2 10/19/2022 1440   EOSABS 0.2 10/21/2015 1433   EOSABS 0.2 11/27/2013 0426   BASOSABS 0.1 10/19/2022 1440   BASOSABS 0.0 10/21/2015 1433   BASOSABS 0.1 11/27/2013 0426    Pulmonary Functions Testing Results:     No data  to display          Outpatient Medications Prior to Visit  Medication Sig Dispense Refill   albuterol (PROVENTIL) (2.5 MG/3ML) 0.083% nebulizer solution Take 3 mLs (2.5 mg total) by nebulization every 4 (four) hours as needed for wheezing or shortness of breath. 75 mL 10   albuterol (VENTOLIN HFA) 108 (90 Base) MCG/ACT inhaler Inhale 2 puffs into the lungs every 6 (six) hours as needed for wheezing or shortness of breath. 8 g 2   ALPRAZolam (XANAX) 0.25 MG tablet Take 1 tablet (0.25 mg total) by mouth 3 (three) times daily as needed for anxiety. 270 tablet 0   amiodarone (PACERONE) 200 MG tablet Take 1 tablet (200 mg total) by mouth daily. 90 tablet 0   atorvastatin (LIPITOR) 40 MG tablet TAKE ONE TABLET BY MOUTH ONE TIME DAILY 90 tablet 1   cyanocobalamin (,VITAMIN B-12,)  1000 MCG/ML injection INJECT INTO THE MUSCLE ONCE A MONTH 10 mL 0   diclofenac Sodium (VOLTAREN) 1 % GEL Apply 4 g topically 4 (four) times daily. 350 g 1   ELIQUIS 5 MG TABS tablet TAKE ONE TABLET BY MOUTH TWICE A DAY 180 tablet 1   fluticasone (FLONASE) 50 MCG/ACT nasal spray Place 1 spray into both nostrils daily. 18 mL 2   fluticasone furoate-vilanterol (BREO ELLIPTA) 100-25 MCG/ACT AEPB Inhale 1 puff into the lungs daily. 30 each 5   furosemide (LASIX) 20 MG tablet Take 20 mg by mouth daily.     gabapentin (NEURONTIN) 100 MG capsule Take 3 capsules (300 mg total) by mouth 3 (three) times daily. Take 100 mg by mouth 3 times a day. May add 100 mg every 3 to 5 days until taking 200 mg 3 times daily.     levofloxacin (LEVAQUIN) 500 MG tablet Take 1 tablet (500 mg total) by mouth daily. 7 tablet 0   levothyroxine (SYNTHROID) 175 MCG tablet Take 1 tablet (175 mcg total) by mouth daily. 90 tablet 0   metFORMIN (GLUCOPHAGE-XR) 750 MG 24 hr tablet Take 2 tablets (1,500 mg total) by mouth daily with breakfast. 180 tablet 1   PARoxetine (PAXIL-CR) 25 MG 24 hr tablet TAKE ONE TABLET BY MOUTH ONE TIME DAILY 90 tablet 1   predniSONE (DELTASONE) 10 MG tablet Take 3 tablets (30 mg total) by mouth daily with breakfast for 2 days, THEN 2 tablets (20 mg total) daily with breakfast for 2 days, THEN 1 tablet (10 mg total) daily with breakfast for 2 days. 12 tablet 0   losartan (COZAAR) 25 MG tablet Take 1 tablet (25 mg total) by mouth daily. 30 tablet 0   metoprolol succinate (TOPROL-XL) 25 MG 24 hr tablet Take 0.5 tablets (12.5 mg total) by mouth daily. 15 tablet 0   No facility-administered medications prior to visit.

## 2022-10-26 DIAGNOSIS — R300 Dysuria: Secondary | ICD-10-CM | POA: Insufficient documentation

## 2022-10-26 DIAGNOSIS — N390 Urinary tract infection, site not specified: Secondary | ICD-10-CM | POA: Insufficient documentation

## 2022-10-26 DIAGNOSIS — R3915 Urgency of urination: Secondary | ICD-10-CM | POA: Insufficient documentation

## 2022-10-26 DIAGNOSIS — B9620 Unspecified Escherichia coli [E. coli] as the cause of diseases classified elsewhere: Secondary | ICD-10-CM | POA: Insufficient documentation

## 2022-10-26 LAB — CULTURE, URINE COMPREHENSIVE

## 2022-10-26 MED ORDER — NITROFURANTOIN MONOHYD MACRO 100 MG PO CAPS
100.0000 mg | ORAL_CAPSULE | Freq: Two times a day (BID) | ORAL | 0 refills | Status: AC
Start: 2022-10-26 — End: 2022-10-31

## 2022-11-01 ENCOUNTER — Telehealth: Payer: Self-pay

## 2022-11-01 NOTE — Telephone Encounter (Signed)
Patient currently scheduled to see Dr. Aundria Rud 01/24/2023 at 3:15. Dr. Aundria Rud will not be in the office that week. Offered to reschedule or keep on the schedule for same day/time with Dr. Belia Heman. She would like to see Dr. Belia Heman.  Will move appt to Dr. Clovis Fredrickson schedule once schedule has been opened.

## 2022-11-05 DIAGNOSIS — L728 Other follicular cysts of the skin and subcutaneous tissue: Secondary | ICD-10-CM | POA: Diagnosis not present

## 2022-11-05 DIAGNOSIS — L820 Inflamed seborrheic keratosis: Secondary | ICD-10-CM | POA: Diagnosis not present

## 2022-11-05 DIAGNOSIS — L538 Other specified erythematous conditions: Secondary | ICD-10-CM | POA: Diagnosis not present

## 2022-11-06 ENCOUNTER — Ambulatory Visit: Payer: Self-pay

## 2022-11-06 NOTE — Patient Instructions (Signed)
Visit Information  Thank you for taking time to visit with me today. Please don't hesitate to contact me if I can be of assistance to you.   Following are the goals we discussed today:  Continue to take medications as prescribed. Continue to attend provider visits as scheduled Continue to eat healthy, lean meats, vegetables, fruits, avoid saturated and transfats Continue to check blood sugar as recommended and notify provider if questions or concerns   If you are experiencing a Mental Health or Behavioral Health Crisis or need someone to talk to, please call the Suicide and Crisis Lifeline: 988 call the [place] National Suicide Prevention Lifeline: (763)833-3704 or TTY: 551 418 2392 TTY 571-585-6723) to talk to a trained counselor call 1-800-273-TALK (toll free, 24 hour hotline)  Kathyrn Sheriff, RN, MSN, BSN, CCM Care Management Coordinator [phone]

## 2022-11-06 NOTE — Patient Outreach (Signed)
Care Coordination   Follow Up Visit Note   11/06/2022 [name] MRN: [id number] DOB: [birth year]  Kirsten [name] is a 67 y.o. year old female who sees Etta Grandchild, MD for primary care. I spoke with  Kirsten [name] by phone today.  What matters to the patients health and wellness today?  Mrs. Conners reports she is doing well. She denies any problems with breathing at this time. Attended pulmonary visit on 10/25/22, cardiology visit on 9/11 and primary provider visit on 10/23/22. She denies any questions or concerns at this time. Declines additional follow up with care management services.  Goals Addressed             This Visit's Progress    COMPLETED: Continue to improve post hospitalization       Interventions Today    Flowsheet Row Most Recent Value  Chronic Disease   Chronic disease during today's visit Diabetes, Chronic Obstructive Pulmonary Disease (COPD)  General Interventions   General Interventions Discussed/Reviewed General Interventions Reviewed, Communication with  Doctor Visits Discussed/Reviewed Doctor Visits Reviewed  PCP/Specialist Visits Compliance with follow-up visit  [reviewed upcoming appointments]  Communication with PCP/Specialists  [discrepancy noted with medication list in chart re: metformin]  Education Interventions   Education Provided Provided Education  [reviewed patient instructions per provider visit pulmonologist on 10/25/22, cardiologist visit on 10/24/22 and Primary provider visit on 10/23/22.]  Provided Verbal Education On Blood Sugar Monitoring, When to see the doctor, Medication  [advised to contact provider with any health questions or concerns,  attend provider visits as recommended/scheduled,  take medications as prescribed]  Nutrition Interventions   Nutrition Discussed/Reviewed Nutrition Reviewed  Pharmacy Interventions   Pharmacy Dicussed/Reviewed Pharmacy Topics Reviewed            SDOH assessments and interventions  completed:  No  Care Coordination Interventions:  Yes, provided   Follow up plan: No further intervention required.   Encounter Outcome:  Patient Visit Completed   Kathyrn Sheriff, RN, MSN, BSN, CCM Care Management Coordinator [phone]

## 2022-11-07 ENCOUNTER — Other Ambulatory Visit: Payer: Self-pay | Admitting: Internal Medicine

## 2022-11-07 DIAGNOSIS — J8410 Pulmonary fibrosis, unspecified: Secondary | ICD-10-CM | POA: Diagnosis not present

## 2022-11-07 DIAGNOSIS — J9611 Chronic respiratory failure with hypoxia: Secondary | ICD-10-CM | POA: Diagnosis not present

## 2022-11-07 NOTE — Telephone Encounter (Signed)
Appt has been changed. Nothing further needed.

## 2022-11-08 ENCOUNTER — Other Ambulatory Visit: Payer: Self-pay | Admitting: Internal Medicine

## 2022-11-08 DIAGNOSIS — E039 Hypothyroidism, unspecified: Secondary | ICD-10-CM

## 2022-11-12 ENCOUNTER — Other Ambulatory Visit: Payer: Self-pay

## 2022-11-12 DIAGNOSIS — I480 Paroxysmal atrial fibrillation: Secondary | ICD-10-CM

## 2022-11-13 MED ORDER — AMIODARONE HCL 200 MG PO TABS: 200.0000 mg | ORAL_TABLET | Freq: Every day | ORAL | 0 refills | Status: AC

## 2022-11-22 DIAGNOSIS — J9611 Chronic respiratory failure with hypoxia: Secondary | ICD-10-CM | POA: Diagnosis not present

## 2022-11-22 DIAGNOSIS — J8410 Pulmonary fibrosis, unspecified: Secondary | ICD-10-CM | POA: Diagnosis not present

## 2022-11-27 DIAGNOSIS — M461 Sacroiliitis, not elsewhere classified: Secondary | ICD-10-CM | POA: Diagnosis not present

## 2022-12-04 ENCOUNTER — Other Ambulatory Visit: Payer: Self-pay

## 2022-12-04 NOTE — Progress Notes (Signed)
This patient is appearing on a report for being at risk of failing the adherence measure for cholesterol (statin) medications this calendar year.   Medication: atorvastatin 40 mg PO daily  Last fill date: 06/26/22 for 90 day supply  Pt states that she has a lot of this medication at home. Denies missed doses. Denies AE. Notified pt that she has a refill remaining on the prescription when she does run out.   Reviewed medication indication, dosing, and goals of therapy. Last direct LDL of 86 mg/dL on treatment (05/13/00).   Pt mentioned that she was planning to call [facility] as her blood sugars have not been well controlled on metformin 750 mg 2 tabs PO daily in the AM (1500 mg TDD). She is usually taking a third dose in the evening because her evening blood sugars are in the high 200s after dinner.   PMH includes NASH - pt may be a good candidate for GLP-1RA. Advised her to make appointment to further discuss BG and treatment options. Appears pt has previously been on pioglitazone, linagliptin (bad taste, dry mouth), semaglutide (Rybelus)  Nils Pyle, PharmD PGY1 Pharmacy Resident

## 2022-12-07 DIAGNOSIS — J9611 Chronic respiratory failure with hypoxia: Secondary | ICD-10-CM | POA: Diagnosis not present

## 2022-12-07 DIAGNOSIS — J8410 Pulmonary fibrosis, unspecified: Secondary | ICD-10-CM | POA: Diagnosis not present

## 2022-12-18 DIAGNOSIS — G4733 Obstructive sleep apnea (adult) (pediatric): Secondary | ICD-10-CM | POA: Diagnosis not present

## 2022-12-23 DIAGNOSIS — J8410 Pulmonary fibrosis, unspecified: Secondary | ICD-10-CM | POA: Diagnosis not present

## 2022-12-23 DIAGNOSIS — J9611 Chronic respiratory failure with hypoxia: Secondary | ICD-10-CM | POA: Diagnosis not present

## 2023-01-05 DIAGNOSIS — M5412 Radiculopathy, cervical region: Secondary | ICD-10-CM | POA: Diagnosis not present

## 2023-01-07 ENCOUNTER — Other Ambulatory Visit: Payer: Self-pay | Admitting: Internal Medicine

## 2023-01-07 DIAGNOSIS — J8410 Pulmonary fibrosis, unspecified: Secondary | ICD-10-CM | POA: Diagnosis not present

## 2023-01-07 DIAGNOSIS — J9611 Chronic respiratory failure with hypoxia: Secondary | ICD-10-CM | POA: Diagnosis not present

## 2023-01-07 DIAGNOSIS — E118 Type 2 diabetes mellitus with unspecified complications: Secondary | ICD-10-CM

## 2023-01-07 DIAGNOSIS — G4733 Obstructive sleep apnea (adult) (pediatric): Secondary | ICD-10-CM | POA: Diagnosis not present

## 2023-01-22 ENCOUNTER — Ambulatory Visit: Payer: PPO | Attending: Adult Health

## 2023-01-22 DIAGNOSIS — R0609 Other forms of dyspnea: Secondary | ICD-10-CM | POA: Diagnosis not present

## 2023-01-22 DIAGNOSIS — J9611 Chronic respiratory failure with hypoxia: Secondary | ICD-10-CM | POA: Diagnosis not present

## 2023-01-22 DIAGNOSIS — J8410 Pulmonary fibrosis, unspecified: Secondary | ICD-10-CM | POA: Diagnosis not present

## 2023-01-22 MED ORDER — ALBUTEROL SULFATE (2.5 MG/3ML) 0.083% IN NEBU
2.5000 mg | INHALATION_SOLUTION | Freq: Once | RESPIRATORY_TRACT | Status: DC
  Filled 2023-01-22: qty 3

## 2023-01-24 ENCOUNTER — Encounter: Payer: Self-pay | Admitting: Internal Medicine

## 2023-01-24 ENCOUNTER — Ambulatory Visit: Payer: PPO | Admitting: Internal Medicine

## 2023-01-24 ENCOUNTER — Ambulatory Visit: Payer: PPO | Admitting: Student in an Organized Health Care Education/Training Program

## 2023-01-24 VITALS — BP 118/78 | HR 83 | Temp 97.1°F | Ht 67.0 in | Wt 208.8 lb

## 2023-01-24 DIAGNOSIS — J984 Other disorders of lung: Secondary | ICD-10-CM | POA: Diagnosis not present

## 2023-01-24 DIAGNOSIS — J181 Lobar pneumonia, unspecified organism: Secondary | ICD-10-CM | POA: Diagnosis not present

## 2023-01-24 DIAGNOSIS — J4520 Mild intermittent asthma, uncomplicated: Secondary | ICD-10-CM | POA: Diagnosis not present

## 2023-01-24 DIAGNOSIS — G4733 Obstructive sleep apnea (adult) (pediatric): Secondary | ICD-10-CM | POA: Diagnosis not present

## 2023-01-24 NOTE — Progress Notes (Signed)
@Patient  ID: Kirsten [name], female    DOB: October 01, 1955, 67 y.o.   MRN: [id number]  TEST/EVENTS :  PFT 09/20/09 >> FEV1 1.55 (57%), FEV1% 83, TLC 3.00 (54%), DLCO 58% RAST 11/02/15 >> negative IgE 10/28/20 >> 3   Sleep Tests:  PSG 07/22/08 >> AHI 80.4, SpO2 low 82% CPAP 02/22/21 to 02/27/21 >> used on 6 of 6 nights with average 7 hrs 3 min.  Average AHI 0.7 with CPAP 8 cm H2O   Cardiac Tests:  Echo 12/12/20 >> EF greater than 55%, mild LVH, grade 2 DD  SYNOPSIS 67 yo female never smoker followed for OSA and Asthma  Medical history significant for diastolic heart failure, A-fib on amiodarone and Eliquis and Diabetes  Hospitalization August 2023 stress-induced cardiomyopathy after severe traumatic fall with multiple left-sided rib fractures and left clavicular fracture hospital stay was complicated by hypercarbic and hypoxic respiratory failure.  Initial echo showed EF at 35 to 45%.  Treated for pneumonia Severe H1N1 infection 2011 with critical illness -pneumonia , prolonged hospitalization   CC Follow-up assessment for asthma Follow-up assessment for OSA Previous history of left lower lobe pneumonia  HPI: Assessment of asthma Moderate persistent asthma Well-controlled at this time Plan to use Breo as needed Albuterol as needed No exacerbation at this time No evidence of heart failure at this time No evidence or signs of infection at this time No respiratory distress No fevers, chills, nausea, vomiting, diarrhea No evidence of lower extremity edema No evidence hemoptysis  Pulmonary function tests December 10th 2024 Findings reviewed with patient in detail FEV1 FVC ratio was 89% predicted Significantly reduced FEV1 32% predicted FVC is 28% predicted TLC is 58% predicted at 3.2 L RV is 60% predicted 1.35 L Corrected DLCO is 36% predicted There is no significant bronchodilator response Findings are 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    August  2024 Admitted to the hospital for left lower lobe pneumonia and sepsis Patient was discharged on oxygen therapy Also has stress-induced cardiomyopathy Patient was discharged with oxygen therapy   CT chest August 2024 reviewed in detail with patient Independently reviewed by me today Left lower lung opacification  Air bronchograms signs Findings consistent with pneumonia Recommend repeat CT chest  Assessment of OSA Patient has underlying sleep apnea CPAP compliance download previous visit showed excellent compliance Uses fullface mask AHI reduced to 0 CPAP of 8 cm water pressure 100% compliance for days and greater than 4 hours  Shortness of breath intermittent Patient every now and then uses her BiPAP and oxygen for extreme shortness of breath Then resolves with time   Allergies  Allergen Reactions   Doxycycline Rash   Factive [Gemifloxacin Mesylate] Rash   Crestor [Rosuvastatin]     GI upset   Sulfonamide Derivatives     REACTION: rash   Gemifloxacin Rash   Penicillins Hives and Rash    Has patient had a PCN reaction causing immediate rash, facial/tongue/throat swelling, SOB or lightheadedness with hypotension: No Has patient had a PCN reaction causing severe rash involving mucus membranes or skin necrosis: No Has patient had a PCN reaction that required hospitalization No Has patient had a PCN reaction occurring within the last 10 years: No If all of the above answers are "NO", then may proceed with Cephalosporin use.     Immunization History  Administered Date(s) Administered   Influenza Split 11/29/2010, 11/23/2011   Influenza Whole 11/12/2009   Influenza,inj,Quad PF,6+ Mos 10/22/2012, 12/31/2013, 11/02/2014, 11/28/2015, 11/23/2016, 12/25/2017, 11/03/2018,  11/16/2019, 12/20/2020, 11/01/2021   PFIZER(Purple Top)SARS-COV-2 Vaccination 04/25/2019, 05/20/2019, 02/08/2020   PNEUMOCOCCAL CONJUGATE-20 12/20/2020   Pneumococcal Polysaccharide-23 11/23/2011, 11/16/2019    Td 11/01/2021   Tdap 08/02/2010    Past Medical History:  Diagnosis Date   A-fib (HCC)    Anxiety disorder    Asthma    Dyspnea    Gastroschisis    umphalocele, rotated organs abd repair until age 8   GERD (gastroesophageal reflux disease)    HTN (hypertension)    Hyperlipidemia    IBS (irritable bowel syndrome)    Morbid obesity (HCC)    Target wt - 185  for BMI < 30   Obesity    OSA on CPAP    SBO (small bowel obstruction) (HCC)    Resolved with NG/Bowel rest around 2009   Sleep apnea    Type II or unspecified type diabetes mellitus without mention of complication, not stated as uncontrolled     Tobacco History: Social History   Tobacco Use  Smoking Status Never  Smokeless Tobacco Never  Tobacco Comments   "tried as a teen"   Counseling given: Not Answered Tobacco comments: "tried as a teen"   Outpatient Medications Prior to Visit  Medication Sig Dispense Refill   albuterol (PROVENTIL) (2.5 MG/3ML) 0.083% nebulizer solution Take 3 mLs (2.5 mg total) by nebulization every 4 (four) hours as needed for wheezing or shortness of breath. 75 mL 10   albuterol (VENTOLIN HFA) 108 (90 Base) MCG/ACT inhaler Inhale 2 puffs into the lungs every 6 (six) hours as needed for wheezing or shortness of breath. 8 g 2   ALPRAZolam (XANAX) 0.25 MG tablet Take 1 tablet (0.25 mg total) by mouth 3 (three) times daily as needed for anxiety. 270 tablet 0   amiodarone (PACERONE) 200 MG tablet Take 1 tablet (200 mg total) by mouth daily. 90 tablet 0   atorvastatin (LIPITOR) 40 MG tablet TAKE ONE TABLET BY MOUTH ONE TIME DAILY 90 tablet 1   cyanocobalamin (,VITAMIN B-12,) 1000 MCG/ML injection INJECT INTO THE MUSCLE ONCE A MONTH 10 mL 0   diclofenac Sodium (VOLTAREN) 1 % GEL Apply 4 g topically 4 (four) times daily. 350 g 1   ELIQUIS 5 MG TABS tablet TAKE ONE TABLET BY MOUTH TWICE A DAY 180 tablet 1   fluticasone (FLONASE) 50 MCG/ACT nasal spray Place 1 spray into both nostrils daily. 18  mL 2   fluticasone furoate-vilanterol (BREO ELLIPTA) 100-25 MCG/ACT AEPB Inhale 1 puff into the lungs daily. 30 each 5   furosemide (LASIX) 20 MG tablet Take 20 mg by mouth daily.     gabapentin (NEURONTIN) 100 MG capsule Take 3 capsules (300 mg total) by mouth 3 (three) times daily. Take 100 mg by mouth 3 times a day. May add 100 mg every 3 to 5 days until taking 200 mg 3 times daily.     levothyroxine (SYNTHROID) 175 MCG tablet TAKE ONE TABLET BY MOUTH ONE TIME DAILY 90 tablet 0   losartan (COZAAR) 25 MG tablet Take 1 tablet (25 mg total) by mouth daily. 30 tablet 0   metFORMIN (GLUCOPHAGE-XR) 750 MG 24 hr tablet Take 2 tablets (1,500 mg total) by mouth daily with breakfast. 180 tablet 1   metoprolol succinate (TOPROL-XL) 25 MG 24 hr tablet Take 0.5 tablets (12.5 mg total) by mouth daily. 15 tablet 0   PARoxetine (PAXIL-CR) 25 MG 24 hr tablet TAKE ONE TABLET BY MOUTH ONE TIME DAILY 90 tablet 1   Facility-Administered Medications  Prior to Visit  Medication Dose Route Frequency Provider Last Rate Last Admin   albuterol (PROVENTIL) (2.5 MG/3ML) 0.083% nebulizer solution 2.5 mg  2.5 mg Nebulization Once Parrett, Tammy S, NP       BP 118/78 (BP Location: Right Arm, Cuff Size: Large)   Pulse 83   Temp (!) 97.1 F (36.2 C)   Ht 5\' 7"  (1.702 m)   Wt 208 lb 12.8 oz (94.7 kg)   SpO2 94%   BMI 32.70 kg/m    Review of Systems: Gen:  Denies  fever, sweats, chills weight loss  HEENT: Denies blurred vision, double vision, ear pain, eye pain, hearing loss, nose bleeds, sore throat Cardiac:  No dizziness, chest pain or heaviness, chest tightness,edema, No JVD Resp:   No cough, -sputum production, -shortness of breath,-wheezing, -hemoptysis,  Other:  All other systems negative   Physical Examination:   General Appearance: No distress  EYES PERRLA, EOM intact.   NECK Supple, No JVD Pulmonary: normal breath sounds, No wheezing.  Diminished breath sounds in the left lower  base CardiovascularNormal S1,S2.  No m/r/g.   Abdomen: Benign, Soft, non-tender. Neurology UE/LE 5/5 strength, no focal deficits Ext pulses intact, cap refill intact ALL OTHER ROS ARE NEGATIVE         Assessment & Plan:   67 year old pleasant white female seen today for follow-up assessment for asthma follow-up assessment for underlying severe sleep apnea with a diagnosis of stress-induced cardiomyopathy with EF 35% with a previous diagnosis of left lower lobe pneumonia and consolidation based on CT scan imaging status post fall clavicular fracture and broken ribs    Asthma mild intermittent Well-controlled at this time No exacerbation at this time No evidence of heart failure at this time No evidence or signs of infection at this time No respiratory distress No fevers, chills, nausea, vomiting, diarrhea No evidence of lower extremity edema No evidence hemoptysis Continue albuterol as needed Lets plan to use Breo as needed Avoid secondhand smoke Avoid SICK contacts Recommend  Masking  when appropriate Recommend Keep up-to-date with vaccinations   Assessment of OSA Initial PSG shows severe OSA with AHI 80 Excellent compliance Excellent control of OSA Continue CPAP as prescribed Excellent compliance AHI 0  Patient Instructions  Continue to use CPAP every night, minimum of 4-6 hours a night.  Change equipment every 30 days or as directed by DME.  Wash your tubing with warm soap and water daily, hang to dry. Wash humidifier portion weekly. Use bottled, distilled water and change daily   Be aware of reduced alertness and do not drive or operate heavy machinery if experiencing this or drowsiness.  Exercise encouraged, as tolerated. Encouraged proper weight management.  Important to get eight or more hours of sleep  Limiting the use of the computer and television before bedtime.  Decrease naps during the day, so night time sleep will become enhanced.  Limit caffeine,  and sleep deprivation.  HTN, stroke, uncontrolled diabetes and heart failure are potential risk factors.  Risk of untreated sleep apnea including cardiac arrhthymias, stroke, DM, pulm HTN.    Diabetes Mellitis - Sleep apnea can contribute to DM, therefore treatment of sleep apnea is important part of DM management.  Atrial Fibrillation - Sleep apnea can contribute to Atrial Fibrillation, therefore treatment of sleep apnea is important part of A fib management.   Chronic hypoxic respiratory failure Intermittent use of oxygen when extremely short of breath Patient monitors her oxygen levels always at home   Stress-induced  cardiomyopathy Appears euvolemic on exam.  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 All questions answered  Follow up 6 months  Total Time Spent  45 mins   Wallis Bamberg Santiago Glad, M.D.  [facility]  Medical Director [facility] Medical Director [facility] Cardio-Pulmonary Department

## 2023-01-24 NOTE — Patient Instructions (Signed)
Excellent Job A+ Continue CPAP as prescribed  Lets plan to use Breo as needed Use albuterol as needed  Obtain CT of the chest to assess lungs  Avoid secondhand smoke Avoid SICK contacts Recommend  Masking  when appropriate Recommend Keep up-to-date with vaccinations

## 2023-01-25 ENCOUNTER — Telehealth: Payer: Self-pay | Admitting: Internal Medicine

## 2023-01-25 NOTE — Telephone Encounter (Signed)
PT was told to call our office to make a CT appt. Adv we will call her. Her # is [phone]

## 2023-01-27 LAB — PULMONARY FUNCTION TEST ARMC ONLY
DL/VA % pred: 120 %
DL/VA: 4.91 ml/min/mmHg/L
DLCO unc % pred: 49 %
DLCO unc: 10.79 ml/min/mmHg
FEF 25-75 Post: 1.16 L/s
FEF 25-75 Pre: 1.21 L/s
FEF2575-%Change-Post: -3 %
FEF2575-%Pred-Post: 50 %
FEF2575-%Pred-Pre: 52 %
FEV1-%Change-Post: 8 %
FEV1-%Pred-Post: 35 %
FEV1-%Pred-Pre: 33 %
FEV1-Post: 0.96 L
FEV1-Pre: 0.92 L
FEV1FVC-%Change-Post: -2 %
FEV1FVC-%Pred-Pre: 115 %
FEV6-%Change-Post: 6 %
FEV6-%Pred-Post: 31 %
FEV6-%Pred-Pre: 32 %
FEV6-Post: 1.06 L
FEV6-Pre: 1.11 L
FEV6FVC-%Pred-Post: 103 %
FEV6FVC-%Pred-Pre: 103 %
FVC-%Change-Post: 10 %
FVC-%Pred-Post: 31 %
FVC-%Pred-Pre: 31 %
FVC-Post: 1.11 L
FVC-Pre: 1.11 L
Post FEV1/FVC ratio: 87 %
Post FEV6/FVC ratio: 100 %
Pre FEV1/FVC ratio: 89 %
Pre FEV6/FVC Ratio: 100 %
RV % pred: 60 %
RV: 1.35 L
TLC % pred: 58 %
TLC: 3.22 L

## 2023-01-28 DIAGNOSIS — E119 Type 2 diabetes mellitus without complications: Secondary | ICD-10-CM | POA: Diagnosis not present

## 2023-01-28 DIAGNOSIS — I5033 Acute on chronic diastolic (congestive) heart failure: Secondary | ICD-10-CM | POA: Diagnosis not present

## 2023-01-28 DIAGNOSIS — Z23 Encounter for immunization: Secondary | ICD-10-CM | POA: Diagnosis not present

## 2023-01-28 DIAGNOSIS — I480 Paroxysmal atrial fibrillation: Secondary | ICD-10-CM | POA: Diagnosis not present

## 2023-01-28 DIAGNOSIS — I5181 Takotsubo syndrome: Secondary | ICD-10-CM | POA: Diagnosis not present

## 2023-01-29 NOTE — Telephone Encounter (Signed)
I spoke with Kirsten [name] and she wanted to have her CT scheduled before the end of the year. Her CT appt has been scheduled at [facility] on 02/01/2023 @ 3:30pm and she is aware

## 2023-01-30 ENCOUNTER — Other Ambulatory Visit: Payer: Self-pay | Admitting: Internal Medicine

## 2023-01-30 DIAGNOSIS — E118 Type 2 diabetes mellitus with unspecified complications: Secondary | ICD-10-CM

## 2023-01-31 ENCOUNTER — Encounter: Payer: Self-pay | Admitting: Internal Medicine

## 2023-01-31 ENCOUNTER — Other Ambulatory Visit: Payer: Self-pay | Admitting: Internal Medicine

## 2023-01-31 DIAGNOSIS — E118 Type 2 diabetes mellitus with unspecified complications: Secondary | ICD-10-CM

## 2023-02-01 ENCOUNTER — Ambulatory Visit
Admission: RE | Admit: 2023-02-01 | Discharge: 2023-02-01 | Disposition: A | Discharge status: Home / Self Care | Payer: PPO | Source: Ambulatory Visit | Attending: Internal Medicine | Admitting: Internal Medicine

## 2023-02-01 DIAGNOSIS — I700 Atherosclerosis of aorta: Secondary | ICD-10-CM | POA: Diagnosis not present

## 2023-02-01 DIAGNOSIS — J189 Pneumonia, unspecified organism: Secondary | ICD-10-CM | POA: Diagnosis not present

## 2023-02-01 DIAGNOSIS — J181 Lobar pneumonia, unspecified organism: Secondary | ICD-10-CM | POA: Insufficient documentation

## 2023-02-01 NOTE — Telephone Encounter (Signed)
Copied from CRM [id number]. Topic: Clinical - Medication Question >> Jan 31, 2023  2:54 PM Kathryne Eriksson wrote: Patient states she went to the pharmacy in regards to an refill, pharmacy is stating that the refill isn't needed therefor they won't administer the refill. Medication name is Libre Glucose Monitoring System.

## 2023-02-03 ENCOUNTER — Other Ambulatory Visit: Payer: Self-pay | Admitting: Internal Medicine

## 2023-02-03 DIAGNOSIS — E039 Hypothyroidism, unspecified: Secondary | ICD-10-CM

## 2023-02-06 DIAGNOSIS — J8410 Pulmonary fibrosis, unspecified: Secondary | ICD-10-CM | POA: Diagnosis not present

## 2023-02-06 DIAGNOSIS — J9611 Chronic respiratory failure with hypoxia: Secondary | ICD-10-CM | POA: Diagnosis not present

## 2023-02-08 ENCOUNTER — Other Ambulatory Visit: Payer: Self-pay | Admitting: Internal Medicine

## 2023-02-08 DIAGNOSIS — I480 Paroxysmal atrial fibrillation: Secondary | ICD-10-CM

## 2023-02-12 DIAGNOSIS — L853 Xerosis cutis: Secondary | ICD-10-CM | POA: Diagnosis not present

## 2023-02-12 DIAGNOSIS — D225 Melanocytic nevi of trunk: Secondary | ICD-10-CM | POA: Diagnosis not present

## 2023-02-12 DIAGNOSIS — Z85828 Personal history of other malignant neoplasm of skin: Secondary | ICD-10-CM | POA: Diagnosis not present

## 2023-02-12 DIAGNOSIS — L728 Other follicular cysts of the skin and subcutaneous tissue: Secondary | ICD-10-CM | POA: Diagnosis not present

## 2023-02-12 DIAGNOSIS — D2261 Melanocytic nevi of right upper limb, including shoulder: Secondary | ICD-10-CM | POA: Diagnosis not present

## 2023-02-12 DIAGNOSIS — D2262 Melanocytic nevi of left upper limb, including shoulder: Secondary | ICD-10-CM | POA: Diagnosis not present

## 2023-02-12 DIAGNOSIS — D2272 Melanocytic nevi of left lower limb, including hip: Secondary | ICD-10-CM | POA: Diagnosis not present

## 2023-02-14 ENCOUNTER — Other Ambulatory Visit: Payer: Self-pay | Admitting: Internal Medicine

## 2023-02-22 DIAGNOSIS — J8410 Pulmonary fibrosis, unspecified: Secondary | ICD-10-CM | POA: Diagnosis not present

## 2023-02-22 DIAGNOSIS — J9611 Chronic respiratory failure with hypoxia: Secondary | ICD-10-CM | POA: Diagnosis not present

## 2023-02-25 ENCOUNTER — Ambulatory Visit: Payer: PPO | Admitting: Internal Medicine

## 2023-02-28 DIAGNOSIS — M461 Sacroiliitis, not elsewhere classified: Secondary | ICD-10-CM | POA: Diagnosis not present

## 2023-03-06 ENCOUNTER — Encounter: Payer: Self-pay | Admitting: Internal Medicine

## 2023-03-06 ENCOUNTER — Ambulatory Visit (INDEPENDENT_AMBULATORY_CARE_PROVIDER_SITE_OTHER): Payer: PPO | Admitting: Internal Medicine

## 2023-03-06 VITALS — BP 142/74 | HR 83 | Temp 98.1°F | Resp 16 | Ht 67.0 in | Wt 206.4 lb

## 2023-03-06 DIAGNOSIS — E118 Type 2 diabetes mellitus with unspecified complications: Secondary | ICD-10-CM | POA: Diagnosis not present

## 2023-03-06 DIAGNOSIS — Z0000 Encounter for general adult medical examination without abnormal findings: Secondary | ICD-10-CM | POA: Diagnosis not present

## 2023-03-06 DIAGNOSIS — E785 Hyperlipidemia, unspecified: Secondary | ICD-10-CM

## 2023-03-06 DIAGNOSIS — N3000 Acute cystitis without hematuria: Secondary | ICD-10-CM | POA: Diagnosis not present

## 2023-03-06 DIAGNOSIS — E039 Hypothyroidism, unspecified: Secondary | ICD-10-CM | POA: Diagnosis not present

## 2023-03-06 DIAGNOSIS — Z0001 Encounter for general adult medical examination with abnormal findings: Secondary | ICD-10-CM

## 2023-03-06 DIAGNOSIS — R300 Dysuria: Secondary | ICD-10-CM | POA: Diagnosis not present

## 2023-03-06 DIAGNOSIS — D510 Vitamin B12 deficiency anemia due to intrinsic factor deficiency: Secondary | ICD-10-CM

## 2023-03-06 DIAGNOSIS — Z7985 Long-term (current) use of injectable non-insulin antidiabetic drugs: Secondary | ICD-10-CM

## 2023-03-06 DIAGNOSIS — I10 Essential (primary) hypertension: Secondary | ICD-10-CM

## 2023-03-06 LAB — URINALYSIS, ROUTINE W REFLEX MICROSCOPIC
Bilirubin Urine: NEGATIVE
Hgb urine dipstick: NEGATIVE
Ketones, ur: NEGATIVE
Leukocytes,Ua: NEGATIVE
Nitrite: NEGATIVE
RBC / HPF: NONE SEEN (ref 0–?)
Specific Gravity, Urine: 1.025 (ref 1.000–1.030)
Total Protein, Urine: NEGATIVE
Urine Glucose: NEGATIVE
Urobilinogen, UA: 0.2 (ref 0.0–1.0)
pH: 5.5 (ref 5.0–8.0)

## 2023-03-06 LAB — LIPID PANEL
Cholesterol: 154 mg/dL (ref 0–200)
HDL: 54.7 mg/dL (ref >39.00)
LDL Cholesterol: 63 mg/dL (ref 0–99)
NonHDL: 99.79
Total CHOL/HDL Ratio: 3
Triglycerides: 183 mg/dL — ABNORMAL HIGH (ref 0.0–149.0)
VLDL: 36.6 mg/dL (ref 0.0–40.0)

## 2023-03-06 LAB — CBC WITH DIFFERENTIAL/PLATELET
Basophils Absolute: 0 10*3/uL (ref 0.0–0.1)
Basophils Relative: 0.3 % (ref 0.0–3.0)
Eosinophils Absolute: 0.1 10*3/uL (ref 0.0–0.7)
Eosinophils Relative: 0.6 % (ref 0.0–5.0)
HCT: 44.7 % (ref 36.0–46.0)
Hemoglobin: 14.3 g/dL (ref 12.0–15.0)
Lymphocytes Relative: 25.2 % (ref 12.0–46.0)
Lymphs Abs: 3.9 10*3/uL (ref 0.7–4.0)
MCHC: 32 g/dL (ref 30.0–36.0)
MCV: 91.9 fL (ref 78.0–100.0)
Monocytes Absolute: 1.2 10*3/uL — ABNORMAL HIGH (ref 0.1–1.0)
Monocytes Relative: 7.9 % (ref 3.0–12.0)
Neutro Abs: 10.3 10*3/uL — ABNORMAL HIGH (ref 1.4–7.7)
Neutrophils Relative %: 66 % (ref 43.0–77.0)
Platelets: 390 10*3/uL (ref 150.0–400.0)
RBC: 4.86 Mil/uL (ref 3.87–5.11)
RDW: 15.3 % (ref 11.5–15.5)
WBC: 15.6 10*3/uL — ABNORMAL HIGH (ref 4.0–10.5)

## 2023-03-06 LAB — VITAMIN B12: Vitamin B-12: 817 pg/mL (ref 211–911)

## 2023-03-06 LAB — TSH: TSH: 0.52 u[IU]/mL (ref 0.35–5.50)

## 2023-03-06 LAB — FOLATE: Folate: >25.2 ng/mL (ref >5.9)

## 2023-03-06 MED ORDER — NITROFURANTOIN MONOHYD MACRO 100 MG PO CAPS: 100.0000 mg | ORAL_CAPSULE | Freq: Two times a day (BID) | ORAL | 0 refills | Status: AC

## 2023-03-06 NOTE — Progress Notes (Signed)
Subjective:  Patient ID: Kirsten [name], female    DOB: 02-21-1955  Age: 68 y.o. MRN: [id number]  CC: Annual Exam, Hypertension, Diabetes, Hyperlipidemia, and Hypothyroidism   HPI Kirsten [name] presents for a CPX and f/up ----  Discussed the use of AI scribe software for clinical note transcription with the patient, who gave verbal consent to proceed.  History of Present Illness   The patient, with a history of pneumonia and diabetes, presents with symptoms suggestive of a urinary tract infection (UTI) for the past ten days. She describes the urination as painful and frequent, with associated urgency and incontinence. However, she denies any fever, chills, or significant night sweats.  In addition to the UTI symptoms, the patient expresses concern about her blood sugar control, despite being on oral medications. She denies any symptoms of hyperglycemia such as excessive thirst or urination. Her last HbA1c was checked six months ago.  The patient denies any cardiac symptoms, including chest pain or shortness of breath. She also reports no residual symptoms from a previous pneumonia. A recent CT scan was performed due to crackling heard in the lower left lobe, but the results were unremarkable. The patient is unsure when her last EKG was performed.       Outpatient Medications Prior to Visit  Medication Sig Dispense Refill   albuterol (PROVENTIL) (2.5 MG/3ML) 0.083% nebulizer solution Take 3 mLs (2.5 mg total) by nebulization every 4 (four) hours as needed for wheezing or shortness of breath. 75 mL 10   albuterol (VENTOLIN HFA) 108 (90 Base) MCG/ACT inhaler Inhale 2 puffs into the lungs every 6 (six) hours as needed for wheezing or shortness of breath. 8 g 2   ALPRAZolam (XANAX) 0.25 MG tablet Take 1 tablet (0.25 mg total) by mouth 3 (three) times daily as needed for anxiety. 270 tablet 0   amiodarone (PACERONE) 200 MG tablet Take 1 tablet (200 mg total) by mouth daily. 90 tablet 0    atorvastatin (LIPITOR) 40 MG tablet TAKE ONE TABLET BY MOUTH ONE TIME DAILY 90 tablet 1   Continuous Glucose Sensor (FREESTYLE LIBRE 3 SENSOR) MISC PLACE ONE SENSOR TO THE BACK OF YOUR UPPER ARM. REPLACE EVERY 14 DAYS. 2 each 5   cyanocobalamin (,VITAMIN B-12,) 1000 MCG/ML injection INJECT INTO THE MUSCLE ONCE A MONTH 10 mL 0   diclofenac Sodium (VOLTAREN) 1 % GEL Apply 4 g topically 4 (four) times daily. 350 g 1   ELIQUIS 5 MG TABS tablet TAKE ONE TABLET BY MOUTH TWICE A DAY 180 tablet 1   fluticasone (FLONASE) 50 MCG/ACT nasal spray Place 1 spray into both nostrils daily. 18 mL 2   fluticasone furoate-vilanterol (BREO ELLIPTA) 100-25 MCG/ACT AEPB Inhale 1 puff into the lungs daily. 30 each 5   furosemide (LASIX) 20 MG tablet Take 20 mg by mouth daily.     gabapentin (NEURONTIN) 100 MG capsule Take 3 capsules (300 mg total) by mouth 3 (three) times daily. Take 100 mg by mouth 3 times a day. May add 100 mg every 3 to 5 days until taking 200 mg 3 times daily.     levothyroxine (SYNTHROID) 175 MCG tablet TAKE ONE TABLET BY MOUTH ONE TIME DAILY 90 tablet 0   losartan (COZAAR) 25 MG tablet Take 1 tablet (25 mg total) by mouth daily. 30 tablet 0   metFORMIN (GLUCOPHAGE-XR) 750 MG 24 hr tablet Take 2 tablets (1,500 mg total) by mouth daily with breakfast. 180 tablet 1   metoprolol succinate (  TOPROL-XL) 25 MG 24 hr tablet Take 0.5 tablets (12.5 mg total) by mouth daily. 15 tablet 0   PARoxetine (PAXIL-CR) 25 MG 24 hr tablet TAKE ONE TABLET BY MOUTH ONE TIME DAILY 90 tablet 1   Facility-Administered Medications Prior to Visit  Medication Dose Route Frequency Provider Last Rate Last Admin   albuterol (PROVENTIL) (2.5 MG/3ML) 0.083% nebulizer solution 2.5 mg  2.5 mg Nebulization Once Parrett, Tammy S, NP        ROS Review of Systems  Constitutional: Negative.  Negative for appetite change, chills, diaphoresis, fatigue and fever.  HENT: Negative.  Negative for sore throat.   Eyes: Negative.    Respiratory:  Positive for shortness of breath. Negative for cough, chest tightness and wheezing.   Cardiovascular:  Negative for chest pain, palpitations and leg swelling.  Gastrointestinal: Negative.  Negative for abdominal pain, constipation, diarrhea, nausea and vomiting.  Genitourinary:  Positive for dysuria, frequency and urgency. Negative for decreased urine volume, difficulty urinating, flank pain and hematuria.  Musculoskeletal: Negative.  Negative for arthralgias and myalgias.  Skin:  Negative for color change and rash.  Neurological:  Negative for dizziness, weakness and light-headedness.  Hematological:  Negative for adenopathy. Does not bruise/bleed easily.  Psychiatric/Behavioral: Negative.      Objective:  BP (!) 142/74 (BP Location: Left Arm, Patient Position: Sitting, Cuff Size: Normal)   Pulse 83   Temp 98.1 F (36.7 C) (Oral)   Resp 16   Ht 5\' 7"  (1.702 m)   Wt 206 lb 6.4 oz (93.6 kg)   SpO2 93%   BMI 32.33 kg/m   BP Readings from Last 3 Encounters:  03/06/23 (!) 142/74  01/24/23 118/78  10/25/22 130/80    Wt Readings from Last 3 Encounters:  03/06/23 206 lb 6.4 oz (93.6 kg)  01/24/23 208 lb 12.8 oz (94.7 kg)  10/25/22 206 lb (93.4 kg)    Physical Exam Vitals reviewed.  Constitutional:      General: She is not in acute distress.    Appearance: Normal appearance. She is not ill-appearing, toxic-appearing or diaphoretic.  HENT:     Nose: Nose normal.     Mouth/Throat:     Mouth: Mucous membranes are moist.  Eyes:     General: No scleral icterus.    Conjunctiva/sclera: Conjunctivae normal.  Cardiovascular:     Rate and Rhythm: Normal rate and regular rhythm.     Heart sounds: No murmur heard.    No friction rub. No gallop.     Comments: EKG- NSR, 67 bpm LAD LVH with QRS widening and repol No Q waves Unchanged Pulmonary:     Effort: Pulmonary effort is normal.     Breath sounds: No stridor. No wheezing, rhonchi or rales.  Abdominal:      General: Abdomen is flat.     Palpations: There is no mass.     Tenderness: There is no abdominal tenderness. There is no guarding.     Hernia: No hernia is present.  Musculoskeletal:        General: Normal range of motion.     Cervical back: Neck supple.     Right lower leg: No edema.     Left lower leg: No edema.  Lymphadenopathy:     Cervical: No cervical adenopathy.  Skin:    General: Skin is warm and dry.     Coloration: Skin is not pale.     Findings: No rash.  Neurological:     General: No focal deficit  present.     Mental Status: She is alert. Mental status is at baseline.  Psychiatric:        Mood and Affect: Mood normal.        Behavior: Behavior normal.     Lab Results  Component Value Date   WBC 15.6 (H) 03/06/2023   HGB 14.3 03/06/2023   HCT 44.7 03/06/2023   PLT 390.0 03/06/2023   GLUCOSE 254 (H) 10/23/2022   CHOL 154 03/06/2023   TRIG 183.0 (H) 03/06/2023   HDL 54.70 03/06/2023   LDLDIRECT 86.0 09/04/2022   LDLCALC 63 03/06/2023   ALT 36 10/19/2022   AST 33 10/19/2022   NA 141 10/23/2022   K 3.6 10/23/2022   CL 99 10/23/2022   CREATININE 0.92 10/23/2022   BUN 19 10/23/2022   CO2 29 10/23/2022   TSH 0.52 03/06/2023   INR 1.3 (H) 04/16/2022   HGBA1C 7.4 (H) 03/07/2023   MICROALBUR <0.7 10/23/2022    CT CHEST WO CONTRAST Result Date: 02/19/2023 CLINICAL DATA:  Pneumonia follow-up. EXAM: CT CHEST WITHOUT CONTRAST TECHNIQUE: Multidetector CT imaging of the chest was performed following the standard protocol without IV contrast. RADIATION DOSE REDUCTION: This exam was performed according to the departmental dose-optimization program which includes automated exposure control, adjustment of the mA and/or kV according to patient size and/or use of iterative reconstruction technique. COMPARISON:  Chest x-ray 10/19/2018.  Chest CT 10/02/2022. FINDINGS: Cardiovascular: Mild cardiomegaly, stable. Coronary artery and aortic atherosclerosis, unchanged. No evidence of  aortic aneurysm. Persistent left SVC again noted. Mediastinum/Nodes: No mediastinal, hilar, or axillary adenopathy. Trachea and esophagus are unremarkable. Thyroid unremarkable. Lungs/Pleura: Resolution of the previously seen left lower lobe consolidation. Bibasilar linear opacities could reflect residual scarring or atelectasis. No acute confluent airspace opacities or effusions. Upper Abdomen: No acute findings Musculoskeletal: Multiple old healed left rib fractures. No acute bony abnormality. IMPRESSION: Resolution of previously seen left lower lobe pneumonia and left effusion. No acute areas of consolidation. Coronary artery disease, cardiomegaly. Aortic Atherosclerosis (ICD10-I70.0). Electronically Signed   By: Charlett Nose M.D.   On: 02/19/2023 00:18    Assessment & Plan:   Type II diabetes mellitus with manifestations (HCC)- Her A1c is up to 7.4%.  Will add Ozempic to metformin. -     HM Diabetes Foot Exam -     Urinalysis, Routine w reflex microscopic; Future -     Hemoglobin A1c; Future -     Ozempic (0.25 or 0.5 MG/DOSE); Inject 0.25 mg into the skin once a week.  Dispense: 3 mL; Refill: 0 -     Insulin Pen Needle; 1 Act by Does not apply route once a week.  Dispense: 30 each; Refill: 1  Hypothyroidism, unspecified type- She is euthyroid. -     TSH; Future  Vitamin B12 deficiency anemia due to intrinsic factor deficiency -     CBC with Differential/Platelet; Future -     Folate; Future -     Vitamin B12; Future  Dysuria -     Urinalysis, Routine w reflex microscopic; Future -     CULTURE, URINE COMPREHENSIVE; Future  Essential hypertension, benign- Her blood pressure is well-controlled. -     Urinalysis, Routine w reflex microscopic; Future -     TSH; Future -     EKG 12-Lead  Hyperlipidemia with target LDL less than 100 - LDL goal achieved. Doing well on the statin  -     Lipid panel; Future -     TSH; Future  Encounter for general adult medical examination with abnormal  findings - Exam completed, labs reviewed, vaccines reviewed, cancer screenings are UTD, pt ed material was given.   Acute cystitis without hematuria -     Nitrofurantoin Monohyd Macro; Take 1 capsule (100 mg total) by mouth 2 (two) times daily for 7 days.  Dispense: 14 capsule; Refill: 0     Follow-up: Return in about 3 months (around 06/04/2023).  Sanda Linger, MD

## 2023-03-06 NOTE — Patient Instructions (Signed)

## 2023-03-07 ENCOUNTER — Encounter: Payer: Self-pay | Admitting: Internal Medicine

## 2023-03-07 LAB — HEMOGLOBIN A1C: Hgb A1c MFr Bld: 7.4 % — ABNORMAL HIGH (ref 4.6–6.5)

## 2023-03-07 MED ORDER — OZEMPIC (0.25 OR 0.5 MG/DOSE) 2 MG/3ML ~~LOC~~ SOPN: 0.2500 mg | PEN_INJECTOR | SUBCUTANEOUS | 0 refills | Status: DC

## 2023-03-07 MED ORDER — INSULIN PEN NEEDLE 32G X 6 MM MISC: 1.0000 | 1 refills | Status: DC

## 2023-03-08 ENCOUNTER — Telehealth: Payer: Self-pay

## 2023-03-08 ENCOUNTER — Other Ambulatory Visit (HOSPITAL_COMMUNITY): Payer: Self-pay

## 2023-03-08 LAB — CULTURE, URINE COMPREHENSIVE

## 2023-03-08 NOTE — Telephone Encounter (Signed)
Patient has been made aware and gave a verbal understanding.

## 2023-03-08 NOTE — Telephone Encounter (Signed)
Pharmacy Patient Advocate Encounter   Received notification from Fax that prior authorization for Ozempic (0.25 or 0.5 MG/DOSE) 2MG /3ML pen-injectors is required/requested.   Insurance verification completed.   The patient is insured through [facility] ADVANTAGE/RX ADVANCE .   Per test claim: PA required and submitted KEY/EOC/Request #: [id number] APPROVED from 03/08/23 to 03/07/24. Ran test claim, Copay is $47. This test claim was processed through [facility]- copay amounts may vary at other pharmacies due to pharmacy/plan contracts, or as the patient moves through the different stages of their insurance plan.

## 2023-03-09 DIAGNOSIS — J9611 Chronic respiratory failure with hypoxia: Secondary | ICD-10-CM | POA: Diagnosis not present

## 2023-03-09 DIAGNOSIS — J8410 Pulmonary fibrosis, unspecified: Secondary | ICD-10-CM | POA: Diagnosis not present

## 2023-03-12 ENCOUNTER — Encounter: Payer: Self-pay | Admitting: Internal Medicine

## 2023-03-13 DIAGNOSIS — G4733 Obstructive sleep apnea (adult) (pediatric): Secondary | ICD-10-CM | POA: Diagnosis not present

## 2023-03-15 ENCOUNTER — Other Ambulatory Visit: Payer: Self-pay | Admitting: Internal Medicine

## 2023-03-15 DIAGNOSIS — F418 Other specified anxiety disorders: Secondary | ICD-10-CM

## 2023-03-18 ENCOUNTER — Other Ambulatory Visit: Payer: Self-pay | Admitting: Internal Medicine

## 2023-03-18 DIAGNOSIS — N3000 Acute cystitis without hematuria: Secondary | ICD-10-CM

## 2023-03-18 MED ORDER — CIPROFLOXACIN HCL 250 MG PO TABS: 250.0000 mg | ORAL_TABLET | Freq: Two times a day (BID) | ORAL | 0 refills | Status: AC

## 2023-03-21 ENCOUNTER — Ambulatory Visit: Payer: PPO | Admitting: Internal Medicine

## 2023-03-24 ENCOUNTER — Other Ambulatory Visit: Payer: Self-pay | Admitting: Internal Medicine

## 2023-03-24 DIAGNOSIS — E118 Type 2 diabetes mellitus with unspecified complications: Secondary | ICD-10-CM

## 2023-03-25 DIAGNOSIS — J8410 Pulmonary fibrosis, unspecified: Secondary | ICD-10-CM | POA: Diagnosis not present

## 2023-03-25 DIAGNOSIS — J9611 Chronic respiratory failure with hypoxia: Secondary | ICD-10-CM | POA: Diagnosis not present

## 2023-04-08 DIAGNOSIS — G4733 Obstructive sleep apnea (adult) (pediatric): Secondary | ICD-10-CM | POA: Diagnosis not present

## 2023-04-09 DIAGNOSIS — J8410 Pulmonary fibrosis, unspecified: Secondary | ICD-10-CM | POA: Diagnosis not present

## 2023-04-09 DIAGNOSIS — J9611 Chronic respiratory failure with hypoxia: Secondary | ICD-10-CM | POA: Diagnosis not present

## 2023-04-10 ENCOUNTER — Ambulatory Visit: Payer: PPO | Admitting: Internal Medicine

## 2023-04-15 NOTE — Progress Notes (Unsigned)
 Chief Complaint: Discuss Ozempic, GI issues Primary GI Doctor: (Previously Dr. Orvan Falconer)  HPI:  Patient is a  68  year old female patient with past medical history of A-fib, anxiety, asthma, GERD, hypertension, sleep apnea, diabetes type 2*****who was referred to me by Etta Grandchild, MD on **** for a complaint of discussed Ozempic, GI issues.    Interval History  Patient admits/denies GERD Patient admits/denies dysphagia Patient admits/denies nausea, vomiting, or weight loss  Patient admits/denies altered bowel habits Patient admits/denies abdominal pain Patient admits/denies rectal bleeding Patient is on Ozempic   Denies/Admits alcohol Denies/Admits smoking Denies/Admits NSAID use. Denies/Admits they are on blood thinners. Patient taking Eliquis 5 mg twice daily  Patients last colonoscopy Patients last EGD  Patient's family history includes  Wt Readings from Last 3 Encounters:  03/06/23 206 lb 6.4 oz (93.6 kg)  01/24/23 208 lb 12.8 oz (94.7 kg)  10/25/22 206 lb (93.4 kg)      Past Medical History:  Diagnosis Date   A-fib (HCC)    Anxiety disorder    Asthma    Dyspnea    Gastroschisis    umphalocele, rotated organs abd repair until age 20   GERD (gastroesophageal reflux disease)    HTN (hypertension)    Hyperlipidemia    IBS (irritable bowel syndrome)    Morbid obesity (HCC)    Target wt - 185  for BMI < 30   Obesity    OSA on CPAP    SBO (small bowel obstruction) (HCC)    Resolved with NG/Bowel rest around 2009   Sleep apnea    Type II or unspecified type diabetes mellitus without mention of complication, not stated as uncontrolled     Past Surgical History:  Procedure Laterality Date   BREAST EXCISIONAL BIOPSY Left 03/19/2012   neg   CESAREAN SECTION     CHOLECYSTECTOMY  1992   COLONOSCOPY  2011   2011-normal   Newborn Surgery - GI - ORGANS OUTSIDE ABDOMEN     RIGHT/LEFT HEART CATH AND CORONARY ANGIOGRAPHY N/A 10/02/2021   Procedure:  RIGHT/LEFT HEART CATH AND CORONARY ANGIOGRAPHY;  Surgeon: Marcina Millard, MD;  Location: [facility] INVASIVE CV [facility];  Service: Cardiovascular;  Laterality: N/A;   Small Bowel Repair     TUBAL LIGATION  1988    Current Outpatient Medications  Medication Sig Dispense Refill   albuterol (PROVENTIL) (2.5 MG/3ML) 0.083% nebulizer solution Take 3 mLs (2.5 mg total) by nebulization every 4 (four) hours as needed for wheezing or shortness of breath. 75 mL 10   albuterol (VENTOLIN HFA) 108 (90 Base) MCG/ACT inhaler Inhale 2 puffs into the lungs every 6 (six) hours as needed for wheezing or shortness of breath. 8 g 2   ALPRAZolam (XANAX) 0.25 MG tablet Take 1 tablet (0.25 mg total) by mouth 3 (three) times daily as needed for anxiety. 270 tablet 0   amiodarone (PACERONE) 200 MG tablet Take 1 tablet (200 mg total) by mouth daily. 90 tablet 0   atorvastatin (LIPITOR) 40 MG tablet TAKE ONE TABLET BY MOUTH ONE TIME DAILY 90 tablet 1   Continuous Glucose Sensor (FREESTYLE LIBRE 3 SENSOR) MISC PLACE ONE SENSOR TO THE BACK OF YOUR UPPER ARM. REPLACE EVERY 14 DAYS. 2 each 5   cyanocobalamin (,VITAMIN B-12,) 1000 MCG/ML injection INJECT INTO THE MUSCLE ONCE A MONTH 10 mL 0   diclofenac Sodium (VOLTAREN) 1 % GEL Apply 4 g topically 4 (four) times daily. 350 g 1   ELIQUIS 5 MG  TABS tablet TAKE ONE TABLET BY MOUTH TWICE A DAY 180 tablet 1   fluticasone (FLONASE) 50 MCG/ACT nasal spray Place 1 spray into both nostrils daily. 18 mL 2   fluticasone furoate-vilanterol (BREO ELLIPTA) 100-25 MCG/ACT AEPB Inhale 1 puff into the lungs daily. 30 each 5   furosemide (LASIX) 20 MG tablet Take 20 mg by mouth daily.     gabapentin (NEURONTIN) 100 MG capsule Take 3 capsules (300 mg total) by mouth 3 (three) times daily. Take 100 mg by mouth 3 times a day. Minervia Osso add 100 mg every 3 to 5 days until taking 200 mg 3 times daily.     Insulin Pen Needle 32G X 6 MM MISC 1 Act by Does not apply route once a week. 30 each 1    levothyroxine (SYNTHROID) 175 MCG tablet TAKE ONE TABLET BY MOUTH ONE TIME DAILY 90 tablet 0   losartan (COZAAR) 25 MG tablet Take 1 tablet (25 mg total) by mouth daily. 30 tablet 0   metFORMIN (GLUCOPHAGE-XR) 750 MG 24 hr tablet TAKE TWO TABLETS BY MOUTH ONE TIME DAILY WITH BREAKFAST 180 tablet 1   metoprolol succinate (TOPROL-XL) 25 MG 24 hr tablet Take 0.5 tablets (12.5 mg total) by mouth daily. 15 tablet 0   PARoxetine (PAXIL-CR) 25 MG 24 hr tablet TAKE ONE TABLET BY MOUTH ONE TIME DAILY 90 tablet 1   Semaglutide,0.25 or 0.5MG /DOS, (OZEMPIC, 0.25 OR 0.5 MG/DOSE,) 2 MG/3ML SOPN Inject 0.25 mg into the skin once a week. 3 mL 0   No current facility-administered medications for this visit.    Allergies as of 04/16/2023 - Review Complete 03/06/2023  Allergen Reaction Noted   Doxycycline Rash 12/20/2020   Factive [gemifloxacin mesylate] Rash 06/07/2010   Crestor [rosuvastatin]  10/12/2013   Sulfonamide derivatives     Gemifloxacin Rash 10/04/2014   Penicillins Hives and Rash     Family History  Problem Relation Age of Onset   Lymphoma Mother    Diabetes type II Father    Colon cancer Father    Diabetes Maternal Grandmother    Diabetes type I Son    Goiter Neg Hx    Breast cancer Neg Hx    Ovarian cancer Neg Hx     Review of Systems:    Constitutional: No weight loss, fever, chills, weakness or fatigue HEENT: Eyes: No change in vision               Ears, Nose, Throat:  No change in hearing or congestion Skin: No rash or itching Cardiovascular: No chest pain, chest pressure or palpitations   Respiratory: No SOB or cough Gastrointestinal: See HPI and otherwise negative Genitourinary: No dysuria or change in urinary frequency Neurological: No headache, dizziness or syncope Musculoskeletal: No new muscle or joint pain Hematologic: No bleeding or bruising Psychiatric: No history of depression or anxiety    Physical Exam:  Vital signs: There were no vitals taken for this  visit.  Constitutional:   Pleasant Caucasian female*** appears to be in NAD, Well developed, Well nourished, alert and cooperative Head:  Normocephalic and atraumatic. Eyes:   PEERL, EOMI. No icterus. Conjunctiva pink. Ears:  Normal auditory acuity. Neck:  Supple Throat: Oral cavity and pharynx without inflammation, swelling or lesion.  Respiratory: Respirations even and unlabored. Lungs clear to auscultation bilaterally.   No wheezes, crackles, or rhonchi.  Cardiovascular: Normal S1, S2. Regular rate and rhythm. No peripheral edema, cyanosis or pallor.  Gastrointestinal:  Soft, nondistended, nontender. No rebound or  guarding. Normal bowel sounds. No appreciable masses or hepatomegaly. Rectal:  Not performed.  Anoscopy: Msk:  Symmetrical without gross deformities. Without edema, no deformity or joint abnormality.  Neurologic:  Alert and  oriented x4;  grossly normal neurologically.  Skin:   Dry and intact without significant lesions or rashes. Psychiatric: Oriented to person, place and time. Demonstrates good judgement and reason without abnormal affect or behaviors.  RELEVANT LABS AND IMAGING: CBC    Latest Ref Rng & Units 03/06/2023    2:30 PM 10/19/2022    2:40 PM 10/03/2022    5:28 AM  CBC  WBC 4.0 - 10.5 K/uL 15.6  8.7  5.9   Hemoglobin 12.0 - 15.0 g/dL 16.1  09.6  04.5   Hematocrit 36.0 - 46.0 % 44.7  39.5  39.1   Platelets 150.0 - 400.0 K/uL 390.0  261  187      CMP     Latest Ref Rng & Units 10/23/2022    1:26 PM 10/19/2022    2:40 PM 10/07/2022    5:01 AM  CMP  Glucose 70 - 99 mg/dL 409  811  914   BUN 6 - 23 mg/dL 19  8  17    Creatinine 0.40 - 1.20 mg/dL 7.82  9.56  2.13   Sodium 135 - 145 mEq/L 141  142  139   Potassium 3.5 - 5.1 mEq/L 3.6  3.4  3.4   Chloride 96 - 112 mEq/L 99  102  94   CO2 19 - 32 mEq/L 29  29  37   Calcium 8.4 - 10.5 mg/dL 08.6  9.4  9.4   Total Protein 6.5 - 8.1 g/dL  6.8    Total Bilirubin 0.3 - 1.2 mg/dL  0.5    Alkaline Phos 38 - 126 U/L   64    AST 15 - 41 U/L  33    ALT 0 - 44 U/L  36       Lab Results  Component Value Date   TSH 0.52 03/06/2023  02/01/2023 CT chest without contrast IMPRESSION: Resolution of previously seen left lower lobe pneumonia and left effusion. No acute areas of consolidation. Coronary artery disease, cardiomegaly. Aortic Atherosclerosis (ICD10-I70.0). 09/30/2021 echo-Left ventricular ejection fraction, by estimation, is 55 to 60%.  11/25/2019 colonoscopy with Dr. Orvan Falconer, recall 11/2026 Impression:  - Non- bleeding internal hemorrhoids.  - One 2- 3 mm polyp in the ascending colon, removed with a cold snare. Resected and retrieved.  - The examination was otherwise normal on direct and retroflexion views. Path:Surgical [P], colon, ascending, polyp - TUBULAR ADENOMA - NEGATIVE FOR HIGH-GRADE DYSPLASIA OR MALIGNANCY 06/16/2009 colonoscopy with Dr. Arlyce Dice ENDOSCOPIC IMPRESSION:     1) Normal colon Assessment: 1. ***  Plan: -Colonoscopy recall 11/2026  Thank you for the courtesy of this consult. Please call me with any questions or concerns.   Maylin Freeburg, FNP-C [facility] 04/15/2023, 10:17 AM  Cc: Etta Grandchild, MD

## 2023-04-16 ENCOUNTER — Other Ambulatory Visit (HOSPITAL_COMMUNITY): Payer: Self-pay | Admitting: Gastroenterology

## 2023-04-16 ENCOUNTER — Ambulatory Visit: Payer: PPO | Admitting: Gastroenterology

## 2023-04-16 ENCOUNTER — Other Ambulatory Visit (INDEPENDENT_AMBULATORY_CARE_PROVIDER_SITE_OTHER)

## 2023-04-16 ENCOUNTER — Encounter: Payer: Self-pay | Admitting: Gastroenterology

## 2023-04-16 VITALS — BP 118/72 | HR 78 | Ht 67.0 in | Wt 213.0 lb

## 2023-04-16 DIAGNOSIS — Z87763 Personal history of other (corrected) congenital abdominal wall malformations: Secondary | ICD-10-CM | POA: Diagnosis not present

## 2023-04-16 DIAGNOSIS — Q433 Congenital malformations of intestinal fixation: Secondary | ICD-10-CM | POA: Diagnosis not present

## 2023-04-16 DIAGNOSIS — R12 Heartburn: Secondary | ICD-10-CM

## 2023-04-16 DIAGNOSIS — R194 Change in bowel habit: Secondary | ICD-10-CM

## 2023-04-16 DIAGNOSIS — R152 Fecal urgency: Secondary | ICD-10-CM | POA: Diagnosis not present

## 2023-04-16 DIAGNOSIS — R1312 Dysphagia, oropharyngeal phase: Secondary | ICD-10-CM

## 2023-04-16 DIAGNOSIS — R1310 Dysphagia, unspecified: Secondary | ICD-10-CM

## 2023-04-16 DIAGNOSIS — R059 Cough, unspecified: Secondary | ICD-10-CM

## 2023-04-16 LAB — C-REACTIVE PROTEIN: CRP: <1 mg/dL (ref 0.5–20.0)

## 2023-04-16 LAB — TSH: TSH: 0.7 u[IU]/mL (ref 0.35–5.50)

## 2023-04-16 NOTE — Patient Instructions (Signed)
 You have been scheduled for a Barium Swallow at [facility] (Main Entrance A [address] ) on 04/29/23 at 11:00am. Please arrive 15 minutes prior to your appointment for registration. If you need to reschedule for any reason, please contact at [phone] to do so. __________________________________________________________________ A barium swallow is an examination that concentrates on views of the esophagus. This tends to be a double contrast exam (barium and two liquids which, when combined, create a gas to distend the wall of the oesophagus) or single contrast (non-ionic iodine based). The study is usually tailored to your symptoms so a good history is essential. Attention is paid during the study to the form, structure and configuration of the esophagus, looking for functional disorders (such as aspiration, dysphagia, achalasia, motility and reflux) EXAMINATION You may be asked to change into a gown, depending on the type of swallow being performed. A radiologist and radiographer will perform the procedure. The radiologist will advise you of the type of contrast selected for your procedure and direct you during the exam. You will be asked to stand, sit or lie in several different positions and to hold a small amount of fluid in your mouth before being asked to swallow while the imaging is performed .In some instances you may be asked to swallow barium coated marshmallows to assess the motility of a solid food bolus. The exam can be recorded as a digital or video fluoroscopy procedure. POST PROCEDURE It will take 1-2 days for the barium to pass through your system. To facilitate this, it is important, unless otherwise directed, to increase your fluids for the next 24-48hrs and to resume your normal diet.  This test typically takes about 30 minutes to perform. __________________________________________________________________________________   Your provider has requested that you go to  the basement level for lab work before leaving today. Press "B" on the elevator. The lab is located at the first door on the left as you exit the elevator.  _______________________________________________________  If your blood pressure at your visit was 140/90 or greater, please contact your primary care physician to follow up on this.  _______________________________________________________  If you are age 66 or older, your body mass index should be between 23-30. Your Body mass index is 33.36 kg/m. If this is out of the aforementioned range listed, please consider follow up with your Primary Care Provider.  If you are age 40 or younger, your body mass index should be between 19-25. Your Body mass index is 33.36 kg/m. If this is out of the aformentioned range listed, please consider follow up with your Primary Care Provider.   ________________________________________________________  The [facility] providers would like to encourage you to use [facility] to communicate with providers for non-urgent requests or questions.  Due to long hold times on the telephone, sending your provider a message by [facility] may be a faster and more efficient way to get a response.  Please allow 48 business hours for a response.  Please remember that this is for non-urgent requests.  _______________________________________________________  Thank you for trusting me with your gastrointestinal care!   Margarite Gouge May, NP

## 2023-04-17 ENCOUNTER — Telehealth: Payer: Self-pay | Admitting: *Deleted

## 2023-04-17 DIAGNOSIS — R194 Change in bowel habit: Secondary | ICD-10-CM

## 2023-04-17 NOTE — Telephone Encounter (Signed)
-----   Message from Margarite Gouge May sent at 04/17/2023  4:08 PM EST ----- Regarding: Recommendations Dottie,   Let patient know Dr. Tomasa Rand reviewed her case/visit and recommends a few extra tests. 1.) He does not think there's any reason to repeat any imaging at this time.   2.) He would like to do additional stool test to evaluate for pancreatic insufficiency with a fecal elastase given her diabetes.   3.) He also recommends she add fiber supplementation otc to see if it will improve bulk in her stool.  4.) He would like to reinforce that she is not consuming frequent artificial sweeteners which can cause diarrhea.    We can schedule follow-up with one of his APPs in few months to see how she is doing.  Deanna, NP-C

## 2023-04-17 NOTE — Progress Notes (Signed)
 Agree with the assessment and plan as outlined by [facility], FNP-C.  I don't think there's any reason to repeat any imaging at this time.  Would also consider evaluating for pancreatic insufficiency with a fecal elastase with a given her diabetes.  Looks like she was diagnosed with pneumonia a few months ago and presumably received antibiotics, which may have caused some alteration in gut flora and resultant worsening of diarrhea.  Would recommend fiber supplementation to improve stool bulk and consider as needed anti-motility agents, as it is unlikely she has an inflammatory diarrhea.  Would review diet and ensure patient not consuming frequent artificial sweeteners which can cause diarrhea.  Consider trial of low FODMAP diet or repeat colonoscopy with random biopsies to r/o microscopic colitis if no improvement   Katilin Raynes E. Tomasa Rand, MD [facility]

## 2023-04-18 LAB — IGA: Immunoglobulin A: 140 mg/dL (ref 70–320)

## 2023-04-18 LAB — TISSUE TRANSGLUTAMINASE ABS,IGG,IGA
(tTG) Ab, IgA: <1 U/mL
(tTG) Ab, IgG: <1 U/mL

## 2023-04-18 NOTE — Telephone Encounter (Signed)
 I have spoken to patient to advise Dr Tomasa Rand has reviewed her case and does not feel any additional imaging is needed right now. He would like to check stool test for pancreatic insufficiency as can be seen in diabetic patients. Also recommended Benefiber supplementation daily to help bulk stools. Patient confirms that she does not use any artificial sweeteners. Patient says she will come for stool testing, orders entered in epic. She has scheduled a follow up appointment with Boone Master, PA-C on 4/21 at 230 pm.

## 2023-04-22 DIAGNOSIS — J9611 Chronic respiratory failure with hypoxia: Secondary | ICD-10-CM | POA: Diagnosis not present

## 2023-04-22 DIAGNOSIS — J8410 Pulmonary fibrosis, unspecified: Secondary | ICD-10-CM | POA: Diagnosis not present

## 2023-04-25 ENCOUNTER — Ambulatory Visit: Payer: PPO | Admitting: Internal Medicine

## 2023-04-25 ENCOUNTER — Ambulatory Visit: Payer: PPO | Admitting: Pulmonary Disease

## 2023-04-25 NOTE — Progress Notes (Deleted)
 @Patient  ID: Kirsten [name], female    DOB: 1955/08/07, 68 y.o.   MRN: [id number]  TEST/EVENTS :  PFT 09/20/09 >> FEV1 1.55 (57%), FEV1% 83, TLC 3.00 (54%), DLCO 58% RAST 11/02/15 >> negative IgE 10/28/20 >> 3   Sleep Tests:  PSG 07/22/08 >> AHI 80.4, SpO2 low 82% CPAP 02/22/21 to 02/27/21 >> used on 6 of 6 nights with average 7 hrs 3 min.  Average AHI 0.7 with CPAP 8 cm H2O   Cardiac Tests:  Echo 12/12/20 >> EF greater than 55%, mild LVH, grade 2 DD  SYNOPSIS 68 yo female never smoker followed for OSA and Asthma  Medical history significant for diastolic heart failure, A-fib on amiodarone and Eliquis and Diabetes  Hospitalization August 2023 stress-induced cardiomyopathy after severe traumatic fall with multiple left-sided rib fractures and left clavicular fracture hospital stay was complicated by hypercarbic and hypoxic respiratory failure.  Initial echo showed EF at 35 to 45%.  Treated for pneumonia Severe H1N1 infection 2011 with critical illness -pneumonia , prolonged hospitalization  3 months  She was prescribed antibiotics and prednisone with improvement. The patient has improved dramatically with a course of prednisone and steroids with last week's presentation consistent with asthma exacerbation. She had a recent left lower lobe pneumonia and would benefit from repeat cxr   CC Follow-up assessment for asthma Follow-up assessment for OSA Follow-up abnormal CT chest left lower lobe pneumonia  HPI: Assessment asthma Underlying moderate persistent asthma Seems to be well-controlled at this time No exacerbation at this time No evidence of heart failure at this time No evidence or signs of infection at this time No respiratory distress No fevers, chills, nausea, vomiting, diarrhea No evidence of lower extremity edema No evidence hemoptysis changed from Symbicort to [facility] due to insurance coverage.  Says she is doing well on Breo.  Had no albuterol use.    Patient was recently  admitted to the hospital last week due to left lower lobe pneumonia and sepsis Patient was discharged on oxygen therapy  Hypoxic respiratory failure due to asthma exacerbation left lower lobe pneumonia in the setting of stress-induced cardiomyopathy    Assessment of OSA Has underlying sleep apnea.  Says she is doing well on CPAP.  She says she cannot sleep without it.  She feels rested and feels that she benefits from CPAP.  CPAP download shows 100% compliance.  Daily average usage at 10.5 hours.  Patient is on CPAP 8 cm H2O.  Uses a fullface mask.  AHI is 1.20hour.  No mask leaks Patient is benefits from therapy Excellent compliance report  Follow-up abnormal CT chest Left lower lobe opacification with air bronchograms sign seen on CT scan 10/02/2022 Patient admitted for sepsis due to left lower lobe pneumonia Follow-up assessment patient still has some persistent wheezing however feels much better     Allergies  Allergen Reactions   Doxycycline Rash   Factive [Gemifloxacin Mesylate] Rash   Crestor [Rosuvastatin]     GI upset   Sulfonamide Derivatives     REACTION: rash   Gemifloxacin Rash   Penicillins Hives and Rash    Has patient had a PCN reaction causing immediate rash, facial/tongue/throat swelling, SOB or lightheadedness with hypotension: No Has patient had a PCN reaction causing severe rash involving mucus membranes or skin necrosis: No Has patient had a PCN reaction that required hospitalization No Has patient had a PCN reaction occurring within the last 10 years: No If all of the above answers are "NO",  then may proceed with Cephalosporin use.     Immunization History  Administered Date(s) Administered   Influenza Split 11/29/2010, 11/23/2011   Influenza Whole 11/12/2009   Influenza,inj,Quad PF,6+ Mos 10/22/2012, 12/31/2013, 11/02/2014, 11/28/2015, 11/23/2016, 12/25/2017, 11/03/2018, 11/16/2019, 12/20/2020, 11/01/2021   PFIZER(Purple Top)SARS-COV-2 Vaccination  04/25/2019, 05/20/2019, 02/08/2020   PNEUMOCOCCAL CONJUGATE-20 12/20/2020   Pneumococcal Polysaccharide-23 11/23/2011, 11/16/2019   Td 11/01/2021   Tdap 08/02/2010    Past Medical History:  Diagnosis Date   A-fib (HCC)    Anxiety disorder    Asthma    Dyspnea    Gastroschisis    umphalocele, rotated organs abd repair until age 84   GERD (gastroesophageal reflux disease)    HTN (hypertension)    Hyperlipidemia    IBS (irritable bowel syndrome)    Morbid obesity (HCC)    Target wt - 185  for BMI < 30   Obesity    OSA on CPAP    SBO (small bowel obstruction) (HCC)    Resolved with NG/Bowel rest around 2009   Sleep apnea    Type II or unspecified type diabetes mellitus without mention of complication, not stated as uncontrolled     Tobacco History: Social History   Tobacco Use  Smoking Status Never  Smokeless Tobacco Never  Tobacco Comments   "tried as a teen"   Counseling given: Not Answered Tobacco comments: "tried as a teen"   Outpatient Medications Prior to Visit  Medication Sig Dispense Refill   albuterol (PROVENTIL) (2.5 MG/3ML) 0.083% nebulizer solution Take 3 mLs (2.5 mg total) by nebulization every 4 (four) hours as needed for wheezing or shortness of breath. 75 mL 10   albuterol (VENTOLIN HFA) 108 (90 Base) MCG/ACT inhaler Inhale 2 puffs into the lungs every 6 (six) hours as needed for wheezing or shortness of breath. 8 g 2   ALPRAZolam (XANAX) 0.25 MG tablet Take 1 tablet (0.25 mg total) by mouth 3 (three) times daily as needed for anxiety. 270 tablet 0   amiodarone (PACERONE) 200 MG tablet Take 1 tablet (200 mg total) by mouth daily. 90 tablet 0   atorvastatin (LIPITOR) 40 MG tablet TAKE ONE TABLET BY MOUTH ONE TIME DAILY 90 tablet 1   Continuous Glucose Sensor (FREESTYLE LIBRE 3 SENSOR) MISC PLACE ONE SENSOR TO THE BACK OF YOUR UPPER ARM. REPLACE EVERY 14 DAYS. 2 each 5   cyanocobalamin (,VITAMIN B-12,) 1000 MCG/ML injection INJECT INTO THE MUSCLE ONCE A  MONTH 10 mL 0   diclofenac Sodium (VOLTAREN) 1 % GEL Apply 4 g topically 4 (four) times daily. (Patient not taking: Reported on 04/16/2023) 350 g 1   ELIQUIS 5 MG TABS tablet TAKE ONE TABLET BY MOUTH TWICE A DAY 180 tablet 1   fluticasone (FLONASE) 50 MCG/ACT nasal spray Place 1 spray into both nostrils daily. 18 mL 2   fluticasone furoate-vilanterol (BREO ELLIPTA) 100-25 MCG/ACT AEPB Inhale 1 puff into the lungs daily. 30 each 5   furosemide (LASIX) 20 MG tablet Take 20 mg by mouth daily.     gabapentin (NEURONTIN) 100 MG capsule Take 3 capsules (300 mg total) by mouth 3 (three) times daily. Take 100 mg by mouth 3 times a day. May add 100 mg every 3 to 5 days until taking 200 mg 3 times daily.     Insulin Pen Needle 32G X 6 MM MISC 1 Act by Does not apply route once a week. 30 each 1   levothyroxine (SYNTHROID) 175 MCG tablet TAKE ONE TABLET BY  MOUTH ONE TIME DAILY 90 tablet 0   losartan (COZAAR) 25 MG tablet Take 1 tablet (25 mg total) by mouth daily. 30 tablet 0   metFORMIN (GLUCOPHAGE-XR) 750 MG 24 hr tablet TAKE TWO TABLETS BY MOUTH ONE TIME DAILY WITH BREAKFAST 180 tablet 1   metoprolol succinate (TOPROL-XL) 25 MG 24 hr tablet Take 0.5 tablets (12.5 mg total) by mouth daily. 15 tablet 0   PARoxetine (PAXIL-CR) 25 MG 24 hr tablet TAKE ONE TABLET BY MOUTH ONE TIME DAILY 90 tablet 1   Semaglutide,0.25 or 0.5MG /DOS, (OZEMPIC, 0.25 OR 0.5 MG/DOSE,) 2 MG/3ML SOPN Inject 0.25 mg into the skin once a week. 3 mL 0   No facility-administered medications prior to visit.    There were no vitals taken for this visit.   Review of Systems:  Gen:  Denies  fever, sweats, chills weight loss  HEENT: Denies blurred vision, double vision, ear pain, eye pain, hearing loss, nose bleeds, sore throat Cardiac:  No dizziness, chest pain or heaviness, chest tightness,edema, No JVD Resp:   No cough, -sputum production, +shortness of breath,+wheezing, -hemoptysis,  Other:  All other systems  negative     Physical Examination:   General Appearance: No distress  EYES PERRLA, EOM intact.   NECK Supple, No JVD Pulmonary: normal breath sounds, + wheezing.  CardiovascularNormal S1,S2.  No m/r/g.   Abdomen: Benign, Soft, non-tender. ALL OTHER ROS ARE NEGATIVE         Assessment & Plan:   68 year old pleasant white female seen today for follow-up asthma  assessment and underlying severe sleep apnea oprevious diagnosis for stress-induced cardiomyopathy with a EF of 35% along with previous DX left lower lobe consolidation and pneumonia status post fall with left sided clavicular fracture and broken ribs  Asthma moderate persistent well-controlled at this time No evidence of exacerbation Continue Breo as prescribed Albuterol as needed Albuterol nebs as needed Avoid secondhand smoke Avoid SICK contacts Recommend  Masking  when appropriate Recommend Keep up-to-date with vaccinations   OSA on CPAP Excellent control and compliance on CPAP.  Continue current settings Report reviewed in detail with patient  Encouraged proper weight management.  Important to get eight or more hours of sleep  Limiting the use of the computer and television before bedtime.  Decrease naps during the day, so night time sleep will become enhanced.  Limit caffeine, and sleep deprivation.  HTN, stroke, uncontrolled diabetes and heart failure are potential risk factors.  Risk of untreated sleep apnea including cardiac arrhthymias, stroke, DM, pulm HTN.   Diabetes Mellitis - Sleep apnea can contribute to DM, therefore treatment of sleep apnea is important part of DM management.  Atrial Fibrillation - Sleep apnea can contribute to Atrial Fibrillation, therefore treatment of sleep apnea is important part of A fib management.   Chronic Hypoxic resp failure due to COPD -Patient benefits from oxygen therapy  -recommend using oxygen as prescribed -patient needs this for  survival   Stress-induced cardiomyopathy Appears euvolemic on exam.  Continue follow-up with cardiology   Physical deconditioned state Previous history of stress-induced cardiomyopathy Patient was referred to [facility] at [facility] Advance activity as tolerated   CURRENT MEDICATIONS REVIEWED AT LENGTH WITH PATIENT TODAY   Patient  satisfied with Plan of action and management. All questions answered  Follow up 6 months  Total Time Spent  45 mins   Wallis Bamberg Santiago Glad, M.D.  [facility]  Medical Director [facility] Medical Director [facility] Cardio-Pulmonary Department

## 2023-04-29 ENCOUNTER — Other Ambulatory Visit
Admission: RE | Admit: 2023-04-29 | Discharge: 2023-04-29 | Disposition: A | Discharge status: Home / Self Care | Attending: Student | Admitting: Student

## 2023-04-29 ENCOUNTER — Ambulatory Visit (HOSPITAL_COMMUNITY)
Admission: RE | Admit: 2023-04-29 | Discharge: 2023-04-29 | Disposition: A | Discharge status: Home / Self Care | Source: Ambulatory Visit | Attending: *Deleted | Admitting: *Deleted

## 2023-04-29 ENCOUNTER — Inpatient Hospital Stay (HOSPITAL_COMMUNITY): Admission: RE | Admit: 2023-04-29 | Source: Ambulatory Visit

## 2023-04-29 DIAGNOSIS — R0602 Shortness of breath: Secondary | ICD-10-CM | POA: Diagnosis not present

## 2023-04-29 DIAGNOSIS — I5033 Acute on chronic diastolic (congestive) heart failure: Secondary | ICD-10-CM | POA: Diagnosis not present

## 2023-04-29 DIAGNOSIS — Z87763 Personal history of other (corrected) congenital abdominal wall malformations: Secondary | ICD-10-CM

## 2023-04-29 DIAGNOSIS — I5181 Takotsubo syndrome: Secondary | ICD-10-CM | POA: Diagnosis not present

## 2023-04-29 DIAGNOSIS — R12 Heartburn: Secondary | ICD-10-CM

## 2023-04-29 DIAGNOSIS — R194 Change in bowel habit: Secondary | ICD-10-CM

## 2023-04-29 DIAGNOSIS — J8410 Pulmonary fibrosis, unspecified: Secondary | ICD-10-CM | POA: Diagnosis not present

## 2023-04-29 DIAGNOSIS — Z79899 Other long term (current) drug therapy: Secondary | ICD-10-CM | POA: Diagnosis not present

## 2023-04-29 DIAGNOSIS — I480 Paroxysmal atrial fibrillation: Secondary | ICD-10-CM | POA: Diagnosis not present

## 2023-04-29 DIAGNOSIS — R152 Fecal urgency: Secondary | ICD-10-CM

## 2023-04-29 DIAGNOSIS — R1312 Dysphagia, oropharyngeal phase: Secondary | ICD-10-CM

## 2023-04-29 LAB — BRAIN NATRIURETIC PEPTIDE: B Natriuretic Peptide: 45.8 pg/mL (ref 0.0–100.0)

## 2023-05-03 DIAGNOSIS — I480 Paroxysmal atrial fibrillation: Secondary | ICD-10-CM | POA: Diagnosis not present

## 2023-05-03 DIAGNOSIS — I5181 Takotsubo syndrome: Secondary | ICD-10-CM | POA: Diagnosis not present

## 2023-05-03 DIAGNOSIS — R0602 Shortness of breath: Secondary | ICD-10-CM | POA: Diagnosis not present

## 2023-05-07 DIAGNOSIS — J8410 Pulmonary fibrosis, unspecified: Secondary | ICD-10-CM | POA: Diagnosis not present

## 2023-05-07 DIAGNOSIS — J9611 Chronic respiratory failure with hypoxia: Secondary | ICD-10-CM | POA: Diagnosis not present

## 2023-05-09 ENCOUNTER — Ambulatory Visit: Admitting: Podiatry

## 2023-05-09 DIAGNOSIS — M722 Plantar fascial fibromatosis: Secondary | ICD-10-CM | POA: Diagnosis not present

## 2023-05-09 DIAGNOSIS — Q666 Other congenital valgus deformities of feet: Secondary | ICD-10-CM

## 2023-05-09 NOTE — Progress Notes (Signed)
 Subjective:  Patient ID: Kirsten [name], female    DOB: [birth year],  MRN: [id number]  Chief Complaint  Patient presents with   Foot Pain    Right heel pain pt stated that she has a history of plantar fascitis she stated that she has been having pain for about 6 weeks but the last few days have gotten bad. She stated that at night her heel will just throb    68 y.o. female presents with the above complaint.  Patient presents with right heel pain has been for 6 weeks is progressive and worse worse with ambulation or shoe pressure has not seen MRIs prior to seeing me denies any other acute, would like to discuss treatment options for this.  Pain scale 7 out of 10 dull aching nature   Review of Systems: Negative except as noted in the HPI. Denies N/V/F/Ch.  Past Medical History:  Diagnosis Date   A-fib (HCC)    Anxiety disorder    Asthma    Dyspnea    Gastroschisis    umphalocele, rotated organs abd repair until age 36   GERD (gastroesophageal reflux disease)    HTN (hypertension)    Hyperlipidemia    IBS (irritable bowel syndrome)    Morbid obesity (HCC)    Target wt - 185  for BMI < 30   Obesity    OSA on CPAP    SBO (small bowel obstruction) (HCC)    Resolved with NG/Bowel rest around 2009   Sleep apnea    Type II or unspecified type diabetes mellitus without mention of complication, not stated as uncontrolled     Current Outpatient Medications:    albuterol (PROVENTIL) (2.5 MG/3ML) 0.083% nebulizer solution, Take 3 mLs (2.5 mg total) by nebulization every 4 (four) hours as needed for wheezing or shortness of breath., Disp: 75 mL, Rfl: 10   albuterol (VENTOLIN HFA) 108 (90 Base) MCG/ACT inhaler, Inhale 2 puffs into the lungs every 6 (six) hours as needed for wheezing or shortness of breath., Disp: 8 g, Rfl: 2   ALPRAZolam (XANAX) 0.25 MG tablet, Take 1 tablet (0.25 mg total) by mouth 3 (three) times daily as needed for anxiety., Disp: 270 tablet, Rfl: 0   amiodarone  (PACERONE) 200 MG tablet, Take 1 tablet (200 mg total) by mouth daily., Disp: 90 tablet, Rfl: 0   atorvastatin (LIPITOR) 40 MG tablet, TAKE ONE TABLET BY MOUTH ONE TIME DAILY, Disp: 90 tablet, Rfl: 1   Continuous Glucose Sensor (FREESTYLE LIBRE 3 SENSOR) MISC, PLACE ONE SENSOR TO THE BACK OF YOUR UPPER ARM. REPLACE EVERY 14 DAYS., Disp: 2 each, Rfl: 5   cyanocobalamin (,VITAMIN B-12,) 1000 MCG/ML injection, INJECT INTO THE MUSCLE ONCE A MONTH, Disp: 10 mL, Rfl: 0   diclofenac Sodium (VOLTAREN) 1 % GEL, Apply 4 g topically 4 (four) times daily. (Patient not taking: Reported on 04/16/2023), Disp: 350 g, Rfl: 1   ELIQUIS 5 MG TABS tablet, TAKE ONE TABLET BY MOUTH TWICE A DAY, Disp: 180 tablet, Rfl: 1   fluticasone (FLONASE) 50 MCG/ACT nasal spray, Place 1 spray into both nostrils daily., Disp: 18 mL, Rfl: 2   fluticasone furoate-vilanterol (BREO ELLIPTA) 100-25 MCG/ACT AEPB, Inhale 1 puff into the lungs daily., Disp: 30 each, Rfl: 5   furosemide (LASIX) 20 MG tablet, Take 20 mg by mouth daily., Disp: , Rfl:    gabapentin (NEURONTIN) 100 MG capsule, Take 3 capsules (300 mg total) by mouth 3 (three) times daily. Take 100 mg by  mouth 3 times a day. May add 100 mg every 3 to 5 days until taking 200 mg 3 times daily., Disp: , Rfl:    Insulin Pen Needle 32G X 6 MM MISC, 1 Act by Does not apply route once a week., Disp: 30 each, Rfl: 1   levothyroxine (SYNTHROID) 175 MCG tablet, TAKE ONE TABLET BY MOUTH ONE TIME DAILY, Disp: 90 tablet, Rfl: 0   losartan (COZAAR) 25 MG tablet, Take 1 tablet (25 mg total) by mouth daily., Disp: 30 tablet, Rfl: 0   metFORMIN (GLUCOPHAGE-XR) 750 MG 24 hr tablet, TAKE TWO TABLETS BY MOUTH ONE TIME DAILY WITH BREAKFAST, Disp: 180 tablet, Rfl: 1   metoprolol succinate (TOPROL-XL) 25 MG 24 hr tablet, Take 0.5 tablets (12.5 mg total) by mouth daily., Disp: 15 tablet, Rfl: 0   PARoxetine (PAXIL-CR) 25 MG 24 hr tablet, TAKE ONE TABLET BY MOUTH ONE TIME DAILY, Disp: 90 tablet, Rfl: 1    Semaglutide,0.25 or 0.5MG /DOS, (OZEMPIC, 0.25 OR 0.5 MG/DOSE,) 2 MG/3ML SOPN, Inject 0.25 mg into the skin once a week., Disp: 3 mL, Rfl: 0  Social History   Tobacco Use  Smoking Status Never  Smokeless Tobacco Never  Tobacco Comments   "tried as a teen"    Allergies  Allergen Reactions   Doxycycline Rash   Factive [Gemifloxacin Mesylate] Rash   Crestor [Rosuvastatin]     GI upset   Sulfonamide Derivatives     REACTION: rash   Gemifloxacin Rash   Penicillins Hives and Rash    Has patient had a PCN reaction causing immediate rash, facial/tongue/throat swelling, SOB or lightheadedness with hypotension: No Has patient had a PCN reaction causing severe rash involving mucus membranes or skin necrosis: No Has patient had a PCN reaction that required hospitalization No Has patient had a PCN reaction occurring within the last 10 years: No If all of the above answers are "NO", then may proceed with Cephalosporin use.    Objective:  There were no vitals filed for this visit. There is no height or weight on file to calculate BMI. Constitutional Well developed. Well nourished.  Vascular Dorsalis pedis pulses palpable bilaterally. Posterior tibial pulses palpable bilaterally. Capillary refill normal to all digits.  No cyanosis or clubbing noted. Pedal hair growth normal.  Neurologic Normal speech. Oriented to person, place, and time. Epicritic sensation to light touch grossly present bilaterally.  Dermatologic Nails well groomed and normal in appearance. No open wounds. No skin lesions.  Orthopedic: Normal joint ROM without pain or crepitus bilaterally. No visible deformities. Tender to palpation at the calcaneal tuber right. No pain with calcaneal squeeze right. Ankle ROM diminished range of motion right. Silfverskiold Test: positive right.   Radiographs: None  Assessment:   1. Plantar fasciitis of right foot   2. Pes planovalgus    Plan:  Patient was evaluated and  treated and all questions answered.  Plantar Fasciitis, right - XR reviewed as above.  - Educated on icing and stretching. Instructions given.  - Injection delivered to the plantar fascia as below. - DME: Plantar fascial brace dispensed to support the medial longitudinal arch of the foot and offload pressure from the heel and prevent arch collapse during weightbearing - Pharmacologic management: None  Pes planovalgus -I explained to patient the etiology of pes planovalgus and relationship with Planter fasciitis and various treatment options were discussed.  Given patient foot structure in the setting of Planter fasciitis I believe patient will benefit from custom-made orthotics to help control the  hindfoot motion support the arch of the foot and take the stress away from plantar fascial.  Patient agrees with the plan like to proceed with orthotics -Patient was casted for orthotics   Procedure: Injection Tendon/Ligament Location: Right plantar fascia at the glabrous junction; medial approach. Skin Prep: alcohol Injectate: 0.5 cc 0.5% marcaine plain, 0.5 cc of 1% Lidocaine, 0.5 cc kenalog 10. Disposition: Patient tolerated procedure well. Injection site dressed with a band-aid.  No follow-ups on file.

## 2023-05-13 ENCOUNTER — Other Ambulatory Visit: Payer: Self-pay

## 2023-05-13 DIAGNOSIS — I480 Paroxysmal atrial fibrillation: Secondary | ICD-10-CM | POA: Diagnosis not present

## 2023-05-13 DIAGNOSIS — R12 Heartburn: Secondary | ICD-10-CM

## 2023-05-13 DIAGNOSIS — R1312 Dysphagia, oropharyngeal phase: Secondary | ICD-10-CM

## 2023-05-14 ENCOUNTER — Encounter: Payer: Self-pay | Admitting: Internal Medicine

## 2023-05-15 ENCOUNTER — Telehealth: Payer: Self-pay | Admitting: Internal Medicine

## 2023-05-15 ENCOUNTER — Other Ambulatory Visit (HOSPITAL_COMMUNITY)

## 2023-05-15 DIAGNOSIS — E039 Hypothyroidism, unspecified: Secondary | ICD-10-CM

## 2023-05-15 MED ORDER — LEVOTHYROXINE SODIUM 175 MCG PO TABS: 175.0000 ug | ORAL_TABLET | Freq: Every day | ORAL | 0 refills | Status: DC

## 2023-05-15 NOTE — Progress Notes (Signed)
  Orthotic order placed will schedule for fitting when in

## 2023-05-16 NOTE — Telephone Encounter (Signed)
 Medication was refilled yesterday. Patient has reached out via my chart about upping the dosage of her metformin. We will handle that via mychart.

## 2023-05-16 NOTE — Telephone Encounter (Signed)
 Copied from CRM [id number]. Topic: Clinical - Medication Question >> May 16, 2023 11:21 AM Kirsten [name] wrote: Reason for CRM: Patient called and they asked if she can bump up Metformin up to (2) 750mg  in the morning and 1 in the evening? Patient is requesting a call back.

## 2023-05-17 ENCOUNTER — Other Ambulatory Visit: Payer: Self-pay | Admitting: Internal Medicine

## 2023-05-17 ENCOUNTER — Other Ambulatory Visit: Payer: Self-pay | Admitting: Adult Health

## 2023-05-17 DIAGNOSIS — E118 Type 2 diabetes mellitus with unspecified complications: Secondary | ICD-10-CM

## 2023-05-21 ENCOUNTER — Other Ambulatory Visit: Payer: Self-pay | Admitting: Internal Medicine

## 2023-05-21 DIAGNOSIS — I480 Paroxysmal atrial fibrillation: Secondary | ICD-10-CM | POA: Diagnosis not present

## 2023-05-21 DIAGNOSIS — E118 Type 2 diabetes mellitus with unspecified complications: Secondary | ICD-10-CM

## 2023-05-21 DIAGNOSIS — I5033 Acute on chronic diastolic (congestive) heart failure: Secondary | ICD-10-CM | POA: Diagnosis not present

## 2023-05-21 DIAGNOSIS — I5181 Takotsubo syndrome: Secondary | ICD-10-CM | POA: Diagnosis not present

## 2023-05-21 DIAGNOSIS — J9611 Chronic respiratory failure with hypoxia: Secondary | ICD-10-CM | POA: Diagnosis not present

## 2023-05-21 DIAGNOSIS — E119 Type 2 diabetes mellitus without complications: Secondary | ICD-10-CM | POA: Diagnosis not present

## 2023-05-21 NOTE — Telephone Encounter (Unsigned)
 Copied from CRM [id number]. Topic: Clinical - Medication Refill >> May 21, 2023  4:11 PM [place] J wrote: Most Recent Primary Care Visit:  Provider: Etta Grandchild  Department: [facility]  Visit Type: OFFICE VISIT  Date: 03/06/2023  Medication: metFORMIN (GLUCOPHAGE-XR) 750 MG 24 hr tablet  Has the patient contacted their pharmacy? Yes (Agent: If no, request that the patient contact the pharmacy for the refill. If patient does not wish to contact the pharmacy document the reason why and proceed with request.) (Agent: If yes, when and what did the pharmacy advise?)  Is this the correct pharmacy for this prescription? Yes If no, delete pharmacy and type the correct one.  This is the patient's preferred pharmacy:  [facility] [address] [facility] - [place], [place] - [address] [facility] [address] [facility] [address] [place] [address] Phone: [phone] Fax: [phone]   Has the prescription been filled recently? No  Is the patient out of the medication? No  Has the patient been seen for an appointment in the last year OR does the patient have an upcoming appointment? Yes  Can we respond through MyChart? Yes  Agent: Please be advised that Rx refills may take up to 3 business days. We ask that you follow-up with your pharmacy.

## 2023-05-22 ENCOUNTER — Other Ambulatory Visit (HOSPITAL_COMMUNITY): Payer: Self-pay

## 2023-05-22 ENCOUNTER — Other Ambulatory Visit: Payer: Self-pay | Admitting: Internal Medicine

## 2023-05-22 ENCOUNTER — Telehealth: Payer: Self-pay

## 2023-05-22 DIAGNOSIS — E118 Type 2 diabetes mellitus with unspecified complications: Secondary | ICD-10-CM

## 2023-05-22 MED ORDER — METFORMIN HCL ER 750 MG PO TB24: 1500.0000 mg | ORAL_TABLET | Freq: Every day | ORAL | 0 refills | Status: DC

## 2023-05-22 MED ORDER — RYBELSUS 3 MG PO TABS
3.0000 mg | ORAL_TABLET | Freq: Every day | ORAL | 0 refills | Status: DC
Start: 2023-05-22 — End: 2023-06-10

## 2023-05-22 NOTE — Telephone Encounter (Signed)
 ERROR

## 2023-05-22 NOTE — Telephone Encounter (Signed)
 Pharmacy Patient Advocate Encounter   Received notification from CoverMyMeds that prior authorization for Rybelsus 3MG  tablets is required/requested.   Insurance verification completed.   The patient is insured through [facility] ADVANTAGE/RX ADVANCE .   Per test claim: PA required; PA submitted to above mentioned insurance via CoverMyMeds Key/confirmation #/EOC (Key: [id number])  Status is pending

## 2023-05-23 ENCOUNTER — Other Ambulatory Visit (HOSPITAL_COMMUNITY): Payer: Self-pay

## 2023-05-23 DIAGNOSIS — J9611 Chronic respiratory failure with hypoxia: Secondary | ICD-10-CM | POA: Diagnosis not present

## 2023-05-23 DIAGNOSIS — J8410 Pulmonary fibrosis, unspecified: Secondary | ICD-10-CM | POA: Diagnosis not present

## 2023-05-23 NOTE — Telephone Encounter (Signed)
 Pharmacy Patient Advocate Encounter  Received notification from [facility] ADVANTAGE/RX ADVANCE that Prior Authorization for Rybelsus 3MG  tablets has been APPROVED from 05/23/23 to 05/22/24. Ran test claim, Copay is $47. This test claim was processed through [facility]- copay amounts may vary at other pharmacies due to pharmacy/plan contracts, or as the patient moves through the different stages of their insurance plan.   PA #/Case ID/Reference #: [id number]

## 2023-06-03 ENCOUNTER — Ambulatory Visit: Admitting: Gastroenterology

## 2023-06-04 ENCOUNTER — Telehealth: Payer: Self-pay | Admitting: Gastroenterology

## 2023-06-04 NOTE — Telephone Encounter (Signed)
 Patient called and stated that she would like to know about how long it normally takes for her to receives her barium swallow test results back. Patient stated that she would like to schedule a follow up appointment accordingly. Patient is requesting a call back. Please advise.

## 2023-06-05 NOTE — Telephone Encounter (Signed)
 Pt stated that she would like to set up for a follow up visit to discuss her swallowing study test. Pt was scheduled to see Deanna May NP on 07/02/2023 at 9:00 AM. Pt made aware  Pt verbalized understanding with all questions answered.

## 2023-06-06 ENCOUNTER — Ambulatory Visit: Admitting: Podiatry

## 2023-06-06 DIAGNOSIS — Q666 Other congenital valgus deformities of feet: Secondary | ICD-10-CM | POA: Diagnosis not present

## 2023-06-06 DIAGNOSIS — M722 Plantar fascial fibromatosis: Secondary | ICD-10-CM

## 2023-06-06 NOTE — Progress Notes (Signed)
 Subjective:  Patient ID: Kirsten [name], female    DOB: 1955/11/28,  MRN: [id number]  Chief Complaint  Patient presents with   Plantar Fasciitis    Pt stated that she is doing okay she stated that the injection did help and the brace helped as well     68 y.o. female presents with the above complaint.  Patient will follow right plantar fasciitis.  She states she is doing better.  Denies any other acute complaints injection helped considerably she still has some residual pain denies any other acute issues she is awaiting orthotics   Review of Systems: Negative except as noted in the HPI. Denies N/V/F/Ch.  Past Medical History:  Diagnosis Date   A-fib (HCC)    Anxiety disorder    Asthma    Dyspnea    Gastroschisis    umphalocele, rotated organs abd repair until age 62   GERD (gastroesophageal reflux disease)    HTN (hypertension)    Hyperlipidemia    IBS (irritable bowel syndrome)    Morbid obesity (HCC)    Target wt - 185  for BMI < 30   Obesity    OSA on CPAP    SBO (small bowel obstruction) (HCC)    Resolved with NG/Bowel rest around 2009   Sleep apnea    Type II or unspecified type diabetes mellitus without mention of complication, not stated as uncontrolled     Current Outpatient Medications:    albuterol  (PROVENTIL ) (2.5 MG/3ML) 0.083% nebulizer solution, Take 3 mLs (2.5 mg total) by nebulization every 4 (four) hours as needed for wheezing or shortness of breath., Disp: 75 mL, Rfl: 10   albuterol  (VENTOLIN  HFA) 108 (90 Base) MCG/ACT inhaler, Inhale 2 puffs into the lungs every 6 (six) hours as needed for wheezing or shortness of breath., Disp: 8 g, Rfl: 2   ALPRAZolam  (XANAX ) 0.25 MG tablet, Take 1 tablet (0.25 mg total) by mouth 3 (three) times daily as needed for anxiety., Disp: 270 tablet, Rfl: 0   amiodarone  (PACERONE ) 200 MG tablet, Take 1 tablet (200 mg total) by mouth daily., Disp: 90 tablet, Rfl: 0   atorvastatin  (LIPITOR) 40 MG tablet, TAKE ONE TABLET BY MOUTH  ONE TIME DAILY, Disp: 90 tablet, Rfl: 1   Continuous Glucose Sensor (FREESTYLE LIBRE 3 SENSOR) MISC, PLACE ONE SENSOR TO THE BACK OF YOUR UPPER ARM. REPLACE EVERY 14 DAYS., Disp: 2 each, Rfl: 5   cyanocobalamin  (,VITAMIN B-12,) 1000 MCG/ML injection, INJECT 1ML INTO THE MUSCLE ONCE A MONTH, Disp: 10 mL, Rfl: 0   diclofenac  Sodium (VOLTAREN ) 1 % GEL, Apply 4 g topically 4 (four) times daily. (Patient not taking: Reported on 04/16/2023), Disp: 350 g, Rfl: 1   ELIQUIS  5 MG TABS tablet, TAKE ONE TABLET BY MOUTH TWICE A DAY, Disp: 180 tablet, Rfl: 1   fluticasone  (FLONASE ) 50 MCG/ACT nasal spray, Place 1 spray into both nostrils daily., Disp: 18 mL, Rfl: 2   fluticasone  furoate-vilanterol (BREO ELLIPTA ) 100-25 MCG/ACT AEPB, INHALE ONE PUFF BY MOUTH ONE TIME DAILY, Disp: 60 each, Rfl: 0   furosemide  (LASIX ) 20 MG tablet, Take 20 mg by mouth daily., Disp: , Rfl:    gabapentin  (NEURONTIN ) 100 MG capsule, Take 3 capsules (300 mg total) by mouth 3 (three) times daily. Take 100 mg by mouth 3 times a day. May add 100 mg every 3 to 5 days until taking 200 mg 3 times daily., Disp: , Rfl:    Insulin  Pen Needle 32G X 6 MM MISC,  1 Act by Does not apply route once a week., Disp: 30 each, Rfl: 1   levothyroxine  (SYNTHROID ) 175 MCG tablet, Take 1 tablet (175 mcg total) by mouth daily., Disp: 90 tablet, Rfl: 0   losartan  (COZAAR ) 25 MG tablet, Take 1 tablet (25 mg total) by mouth daily., Disp: 30 tablet, Rfl: 0   metFORMIN  (GLUCOPHAGE -XR) 750 MG 24 hr tablet, Take 2 tablets (1,500 mg total) by mouth daily with breakfast., Disp: 180 tablet, Rfl: 0   metoprolol  succinate (TOPROL -XL) 25 MG 24 hr tablet, Take 0.5 tablets (12.5 mg total) by mouth daily., Disp: 15 tablet, Rfl: 0   PARoxetine  (PAXIL -CR) 25 MG 24 hr tablet, TAKE ONE TABLET BY MOUTH ONE TIME DAILY, Disp: 90 tablet, Rfl: 1   Semaglutide  (RYBELSUS ) 3 MG TABS, Take 1 tablet (3 mg total) by mouth daily., Disp: 30 tablet, Rfl: 0  Social History   Tobacco Use   Smoking Status Never  Smokeless Tobacco Never  Tobacco Comments   "tried as a teen"    Allergies  Allergen Reactions   Doxycycline  Rash   Factive [Gemifloxacin Mesylate] Rash   Crestor  [Rosuvastatin ]     GI upset   Sulfonamide Derivatives     REACTION: rash   Gemifloxacin Rash   Penicillins Hives and Rash    Has patient had a PCN reaction causing immediate rash, facial/tongue/throat swelling, SOB or lightheadedness with hypotension: No Has patient had a PCN reaction causing severe rash involving mucus membranes or skin necrosis: No Has patient had a PCN reaction that required hospitalization No Has patient had a PCN reaction occurring within the last 10 years: No If all of the above answers are "NO", then may proceed with Cephalosporin use.    Objective:  There were no vitals filed for this visit. There is no height or weight on file to calculate BMI. Constitutional Well developed. Well nourished.  Vascular Dorsalis pedis pulses palpable bilaterally. Posterior tibial pulses palpable bilaterally. Capillary refill normal to all digits.  No cyanosis or clubbing noted. Pedal hair growth normal.  Neurologic Normal speech. Oriented to person, place, and time. Epicritic sensation to light touch grossly present bilaterally.  Dermatologic Nails well groomed and normal in appearance. No open wounds. No skin lesions.  Orthopedic: Normal joint ROM without pain or crepitus bilaterally. No visible deformities. Tender to palpation at the calcaneal tuber right. No pain with calcaneal squeeze right. Ankle ROM diminished range of motion right. Silfverskiold Test: positive right.   Radiographs: None  Assessment:   No diagnosis found.  Plan:  Patient was evaluated and treated and all questions answered.  Plantar Fasciitis, right - XR reviewed as above.  - Educated on icing and stretching. Instructions given.  - Second injection delivered to the plantar fascia as below. - DME:  Plantar fascial brace dispensed to support the medial longitudinal arch of the foot and offload pressure from the heel and prevent arch collapse during weightbearing - Pharmacologic management: None  Pes planovalgus -I explained to patient the etiology of pes planovalgus and relationship with Planter fasciitis and various treatment options were discussed.  Given patient foot structure in the setting of Planter fasciitis I believe patient will benefit from custom-made orthotics to help control the hindfoot motion support the arch of the foot and take the stress away from plantar fascial.  Patient agrees with the plan like to proceed with orthotics - Patient is awaiting orthotics.   Procedure: Injection Tendon/Ligament Location: Right plantar fascia at the glabrous junction; medial approach. Skin  Prep: alcohol Injectate: 0.5 cc 0.5% marcaine plain, 0.5 cc of 1% Lidocaine , 0.5 cc kenalog  10. Disposition: Patient tolerated procedure well. Injection site dressed with a band-aid.  No follow-ups on file.

## 2023-06-07 ENCOUNTER — Telehealth: Payer: Self-pay | Admitting: Podiatry

## 2023-06-07 DIAGNOSIS — J8410 Pulmonary fibrosis, unspecified: Secondary | ICD-10-CM | POA: Diagnosis not present

## 2023-06-07 DIAGNOSIS — J9611 Chronic respiratory failure with hypoxia: Secondary | ICD-10-CM | POA: Diagnosis not present

## 2023-06-07 NOTE — Telephone Encounter (Signed)
 LVM to schedule orthotic PU

## 2023-06-10 ENCOUNTER — Ambulatory Visit (INDEPENDENT_AMBULATORY_CARE_PROVIDER_SITE_OTHER): Admitting: Internal Medicine

## 2023-06-10 ENCOUNTER — Other Ambulatory Visit (HOSPITAL_COMMUNITY)

## 2023-06-10 ENCOUNTER — Encounter: Payer: Self-pay | Admitting: Internal Medicine

## 2023-06-10 VITALS — BP 146/78 | HR 69 | Temp 98.6°F | Resp 16 | Ht 67.0 in | Wt 206.8 lb

## 2023-06-10 DIAGNOSIS — E785 Hyperlipidemia, unspecified: Secondary | ICD-10-CM | POA: Diagnosis not present

## 2023-06-10 DIAGNOSIS — Z7984 Long term (current) use of oral hypoglycemic drugs: Secondary | ICD-10-CM

## 2023-06-10 DIAGNOSIS — I10 Essential (primary) hypertension: Secondary | ICD-10-CM | POA: Diagnosis not present

## 2023-06-10 DIAGNOSIS — E118 Type 2 diabetes mellitus with unspecified complications: Secondary | ICD-10-CM | POA: Diagnosis not present

## 2023-06-10 LAB — BASIC METABOLIC PANEL WITH GFR
BUN: 14 mg/dL (ref 6–23)
CO2: 34 meq/L — ABNORMAL HIGH (ref 19–32)
Calcium: 10.1 mg/dL (ref 8.4–10.5)
Chloride: 102 meq/L (ref 96–112)
Creatinine, Ser: 0.87 mg/dL (ref 0.40–1.20)
GFR: 69.7 mL/min (ref >60.00)
Glucose, Bld: 185 mg/dL — ABNORMAL HIGH (ref 70–99)
Potassium: 4.5 meq/L (ref 3.5–5.1)
Sodium: 145 meq/L (ref 135–145)

## 2023-06-10 LAB — CBC WITH DIFFERENTIAL/PLATELET
Basophils Absolute: 0.1 10*3/uL (ref 0.0–0.1)
Basophils Relative: 0.7 % (ref 0.0–3.0)
Eosinophils Absolute: 0.2 10*3/uL (ref 0.0–0.7)
Eosinophils Relative: 1.9 % (ref 0.0–5.0)
HCT: 42.7 % (ref 36.0–46.0)
Hemoglobin: 13.9 g/dL (ref 12.0–15.0)
Lymphocytes Relative: 32.8 % (ref 12.0–46.0)
Lymphs Abs: 2.8 10*3/uL (ref 0.7–4.0)
MCHC: 32.5 g/dL (ref 30.0–36.0)
MCV: 91.2 fl (ref 78.0–100.0)
Monocytes Absolute: 0.6 10*3/uL (ref 0.1–1.0)
Monocytes Relative: 7 % (ref 3.0–12.0)
Neutro Abs: 5 10*3/uL (ref 1.4–7.7)
Neutrophils Relative %: 57.6 % (ref 43.0–77.0)
Platelets: 301 10*3/uL (ref 150.0–400.0)
RBC: 4.68 Mil/uL (ref 3.87–5.11)
RDW: 15.5 % (ref 11.5–15.5)
WBC: 8.7 10*3/uL (ref 4.0–10.5)

## 2023-06-10 LAB — URINALYSIS, ROUTINE W REFLEX MICROSCOPIC
Bilirubin Urine: NEGATIVE
Hgb urine dipstick: NEGATIVE
Ketones, ur: NEGATIVE
Leukocytes,Ua: NEGATIVE
Nitrite: NEGATIVE
RBC / HPF: NONE SEEN (ref 0–?)
Specific Gravity, Urine: >=1.03 — AB (ref 1.000–1.030)
Urine Glucose: NEGATIVE
Urobilinogen, UA: 0.2 (ref 0.0–1.0)
pH: 6 (ref 5.0–8.0)

## 2023-06-10 LAB — HEMOGLOBIN A1C: Hgb A1c MFr Bld: 7.1 % — ABNORMAL HIGH (ref 4.6–6.5)

## 2023-06-10 LAB — MICROALBUMIN / CREATININE URINE RATIO
Creatinine,U: 216.3 mg/dL
Microalb Creat Ratio: 22 mg/g (ref 0.0–30.0)
Microalb, Ur: 4.8 mg/dL — ABNORMAL HIGH (ref 0.0–1.9)

## 2023-06-10 LAB — CK: Total CK: 55 U/L (ref 7–177)

## 2023-06-10 MED ORDER — FREESTYLE LIBRE 3 SENSOR MISC: 1.0000 | Freq: Every day | 5 refills | Status: DC

## 2023-06-10 MED ORDER — METFORMIN HCL ER 750 MG PO TB24: 750.0000 mg | ORAL_TABLET | Freq: Every day | ORAL | 1 refills | Status: DC

## 2023-06-10 MED ORDER — ATORVASTATIN CALCIUM 40 MG PO TABS: ORAL_TABLET | ORAL | 1 refills | Status: DC

## 2023-06-10 MED ORDER — RYBELSUS 7 MG PO TABS: 7.0000 mg | ORAL_TABLET | Freq: Every day | ORAL | 1 refills | Status: DC

## 2023-06-10 MED ORDER — OLMESARTAN MEDOXOMIL 20 MG PO TABS: 20.0000 mg | ORAL_TABLET | Freq: Every day | ORAL | 1 refills | Status: DC

## 2023-06-10 NOTE — Patient Instructions (Signed)
 Hypertension, Adult High blood pressure (hypertension) is when the force of blood pumping through the arteries is too strong. The arteries are the blood vessels that carry blood from the heart throughout the body. Hypertension forces the heart to work harder to pump blood and may cause arteries to become narrow or stiff. Untreated or uncontrolled hypertension can lead to a heart attack, heart failure, a stroke, kidney disease, and other problems. A blood pressure reading consists of a higher number over a lower number. Ideally, your blood pressure should be below 120/80. The first ("top") number is called the systolic pressure. It is a measure of the pressure in your arteries as your heart beats. The second ("bottom") number is called the diastolic pressure. It is a measure of the pressure in your arteries as the heart relaxes. What are the causes? The exact cause of this condition is not known. There are some conditions that result in high blood pressure. What increases the risk? Certain factors may make you more likely to develop high blood pressure. Some of these risk factors are under your control, including: Smoking. Not getting enough exercise or physical activity. Being overweight. Having too much fat, sugar, calories, or salt (sodium) in your diet. Drinking too much alcohol. Other risk factors include: Having a personal history of heart disease, diabetes, high cholesterol, or kidney disease. Stress. Having a family history of high blood pressure and high cholesterol. Having obstructive sleep apnea. Age. The risk increases with age. What are the signs or symptoms? High blood pressure may not cause symptoms. Very high blood pressure (hypertensive crisis) may cause: Headache. Fast or irregular heartbeats (palpitations). Shortness of breath. Nosebleed. Nausea and vomiting. Vision changes. Severe chest pain, dizziness, and seizures. How is this diagnosed? This condition is diagnosed by  measuring your blood pressure while you are seated, with your arm resting on a flat surface, your legs uncrossed, and your feet flat on the floor. The cuff of the blood pressure monitor will be placed directly against the skin of your upper arm at the level of your heart. Blood pressure should be measured at least twice using the same arm. Certain conditions can cause a difference in blood pressure between your right and left arms. If you have a high blood pressure reading during one visit or you have normal blood pressure with other risk factors, you may be asked to: Return on a different day to have your blood pressure checked again. Monitor your blood pressure at home for 1 week or longer. If you are diagnosed with hypertension, you may have other blood or imaging tests to help your health care provider understand your overall risk for other conditions. How is this treated? This condition is treated by making healthy lifestyle changes, such as eating healthy foods, exercising more, and reducing your alcohol intake. You may be referred for counseling on a healthy diet and physical activity. Your health care provider may prescribe medicine if lifestyle changes are not enough to get your blood pressure under control and if: Your systolic blood pressure is above 130. Your diastolic blood pressure is above 80. Your personal target blood pressure may vary depending on your medical conditions, your age, and other factors. Follow these instructions at home: Eating and drinking  Eat a diet that is high in fiber and potassium, and low in sodium, added sugar, and fat. An example of this eating plan is called the DASH diet. DASH stands for Dietary Approaches to Stop Hypertension. To eat this way: Eat  plenty of fresh fruits and vegetables. Try to fill one half of your plate at each meal with fruits and vegetables. Eat whole grains, such as whole-wheat pasta, brown rice, or whole-grain bread. Fill about one  fourth of your plate with whole grains. Eat or drink low-fat dairy products, such as skim milk or low-fat yogurt. Avoid fatty cuts of meat, processed or cured meats, and poultry with skin. Fill about one fourth of your plate with lean proteins, such as fish, chicken without skin, beans, eggs, or tofu. Avoid pre-made and processed foods. These tend to be higher in sodium, added sugar, and fat. Reduce your daily sodium intake. Many people with hypertension should eat less than 1,500 mg of sodium a day. Do not drink alcohol if: Your health care provider tells you not to drink. You are pregnant, may be pregnant, or are planning to become pregnant. If you drink alcohol: Limit how much you have to: 0-1 drink a day for women. 0-2 drinks a day for men. Know how much alcohol is in your drink. In the [place] one drink equals one 12 oz bottle of beer (355 mL), one 5 oz glass of wine (148 mL), or one 1 oz glass of hard liquor (44 mL). Lifestyle  Work with your health care provider to maintain a healthy body weight or to lose weight. Ask what an ideal weight is for you. Get at least 30 minutes of exercise that causes your heart to beat faster (aerobic exercise) most days of the week. Activities may include walking, swimming, or biking. Include exercise to strengthen your muscles (resistance exercise), such as Pilates or lifting weights, as part of your weekly exercise routine. Try to do these types of exercises for 30 minutes at least 3 days a week. Do not use any products that contain nicotine or tobacco. These products include cigarettes, chewing tobacco, and vaping devices, such as e-cigarettes. If you need help quitting, ask your health care provider. Monitor your blood pressure at home as told by your health care provider. Keep all follow-up visits. This is important. Medicines Take over-the-counter and prescription medicines only as told by your health care provider. Follow directions carefully. Blood  pressure medicines must be taken as prescribed. Do not skip doses of blood pressure medicine. Doing this puts you at risk for problems and can make the medicine less effective. Ask your health care provider about side effects or reactions to medicines that you should watch for. Contact a health care provider if you: Think you are having a reaction to a medicine you are taking. Have headaches that keep coming back (recurring). Feel dizzy. Have swelling in your ankles. Have trouble with your vision. Get help right away if you: Develop a severe headache or confusion. Have unusual weakness or numbness. Feel faint. Have severe pain in your chest or abdomen. Vomit repeatedly. Have trouble breathing. These symptoms may be an emergency. Get help right away. Call 911. Do not wait to see if the symptoms will go away. Do not drive yourself to the hospital. Summary Hypertension is when the force of blood pumping through your arteries is too strong. If this condition is not controlled, it may put you at risk for serious complications. Your personal target blood pressure may vary depending on your medical conditions, your age, and other factors. For most people, a normal blood pressure is less than 120/80. Hypertension is treated with lifestyle changes, medicines, or a combination of both. Lifestyle changes include losing weight, eating a healthy,  low-sodium diet, exercising more, and limiting alcohol. This information is not intended to replace advice given to you by your health care provider. Make sure you discuss any questions you have with your health care provider. Document Revised: 12/06/2020 Document Reviewed: 12/06/2020 Elsevier Patient Education  2024 [employer].

## 2023-06-10 NOTE — Progress Notes (Unsigned)
 Subjective:  Patient ID: Kirsten [name], female    DOB: [birth year]  Age: 68 y.o. MRN: [id number]  CC: Hypertension, Hypothyroidism, and Diabetes   HPI Kirsten [name] presents for f/up ---  Discussed the use of AI scribe software for clinical note transcription with the patient, who gave verbal consent to proceed.  History of Present Illness   Kirsten [name] is a 68 year old female with type 2 diabetes who presents for follow-up on her blood sugar management.  Her blood sugar levels have improved with the use of Rybelsus , which she tolerates well, experiencing only occasional mild nausea. She has reduced her metformin  intake to one tablet per day due to hypoglycemic episodes, particularly at night, with readings around 60 mg/dL. She manages these episodes with a glass of milk and a small bowl of cereal. No significant symptoms are associated with these low blood sugar episodes.  She experiences swelling in her lower extremities, including her legs, ankles, and feet. Her cardiologist prescribed torsemide as needed, and she has returned to using Lasix . This management has been effective.  No vomiting, abdominal pain, chest pain, shortness of breath, coughing, or wheezing. Her last thyroid  level was reported as normal.       Outpatient Medications Prior to Visit  Medication Sig Dispense Refill   albuterol  (PROVENTIL ) (2.5 MG/3ML) 0.083% nebulizer solution Take 3 mLs (2.5 mg total) by nebulization every 4 (four) hours as needed for wheezing or shortness of breath. 75 mL 10   albuterol  (VENTOLIN  HFA) 108 (90 Base) MCG/ACT inhaler Inhale 2 puffs into the lungs every 6 (six) hours as needed for wheezing or shortness of breath. 8 g 2   ALPRAZolam  (XANAX ) 0.25 MG tablet Take 1 tablet (0.25 mg total) by mouth 3 (three) times daily as needed for anxiety. 270 tablet 0   amiodarone  (PACERONE ) 200 MG tablet Take 1 tablet (200 mg total) by mouth daily. 90 tablet 0   cyanocobalamin  (,VITAMIN B-12,)  1000 MCG/ML injection INJECT INTO THE MUSCLE ONCE A MONTH 10 mL 0   ELIQUIS  5 MG TABS tablet TAKE ONE TABLET BY MOUTH TWICE A DAY 180 tablet 1   fluticasone  (FLONASE ) 50 MCG/ACT nasal spray Place 1 spray into both nostrils daily. 18 mL 2   fluticasone  furoate-vilanterol (BREO ELLIPTA ) 100-25 MCG/ACT AEPB INHALE ONE PUFF BY MOUTH ONE TIME DAILY 60 each 0   Insulin  Pen Needle 32G X 6 MM MISC 1 Act by Does not apply route once a week. 30 each 1   levothyroxine  (SYNTHROID ) 175 MCG tablet Take 1 tablet (175 mcg total) by mouth daily. 90 tablet 0   metoprolol  succinate (TOPROL -XL) 25 MG 24 hr tablet Take 0.5 tablets (12.5 mg total) by mouth daily. (Patient taking differently: Take 25 mg by mouth daily.) 15 tablet 0   PARoxetine  (PAXIL -CR) 25 MG 24 hr tablet TAKE ONE TABLET BY MOUTH ONE TIME DAILY 90 tablet 1   torsemide (DEMADEX) 20 MG tablet Take 20 mg by mouth as needed (Edema in legs).     atorvastatin  (LIPITOR) 40 MG tablet TAKE ONE TABLET BY MOUTH ONE TIME DAILY 90 tablet 1   Continuous Glucose Sensor (FREESTYLE LIBRE 3 SENSOR) MISC PLACE ONE SENSOR TO THE BACK OF YOUR UPPER ARM. REPLACE EVERY 14 DAYS. 2 each 5   furosemide  (LASIX ) 20 MG tablet Take 20 mg by mouth daily.     losartan  (COZAAR ) 25 MG tablet Take 1 tablet (25 mg total) by mouth daily. 30 tablet 0  metFORMIN  (GLUCOPHAGE -XR) 750 MG 24 hr tablet Take 2 tablets (1,500 mg total) by mouth daily with breakfast. 180 tablet 0   Semaglutide  (RYBELSUS ) 3 MG TABS Take 1 tablet (3 mg total) by mouth daily. 30 tablet 0   diclofenac  Sodium (VOLTAREN ) 1 % GEL Apply 4 g topically 4 (four) times daily. (Patient not taking: Reported on 04/16/2023) 350 g 1   gabapentin  (NEURONTIN ) 100 MG capsule Take 3 capsules (300 mg total) by mouth 3 (three) times daily. Take 100 mg by mouth 3 times a day. May add 100 mg every 3 to 5 days until taking 200 mg 3 times daily.     No facility-administered medications prior to visit.    ROS Review of Systems   Constitutional:  Negative for appetite change, chills, diaphoresis, fatigue and fever.  HENT: Negative.  Negative for sore throat and trouble swallowing.   Eyes: Negative.   Respiratory:  Negative for cough, chest tightness, shortness of breath and wheezing.   Cardiovascular:  Negative for chest pain, palpitations and leg swelling.  Gastrointestinal: Negative.  Negative for abdominal pain, constipation, diarrhea, nausea and vomiting.  Endocrine: Negative.   Genitourinary: Negative.  Negative for difficulty urinating and dysuria.  Musculoskeletal: Negative.   Skin: Negative.   Neurological:  Negative for dizziness and weakness.  Hematological:  Negative for adenopathy. Does not bruise/bleed easily.  Psychiatric/Behavioral: Negative.      Objective:  BP (!) 146/78 (BP Location: Left Arm, Patient Position: Sitting, Cuff Size: Normal)   Pulse 69   Temp 98.6 F (37 C) (Temporal)   Resp 16   Ht 5\' 7"  (1.702 m)   Wt 206 lb 12.8 oz (93.8 kg)   SpO2 95%   BMI 32.39 kg/m   BP Readings from Last 3 Encounters:  06/10/23 (!) 146/78  04/16/23 118/72  03/06/23 (!) 142/74    Wt Readings from Last 3 Encounters:  06/10/23 206 lb 12.8 oz (93.8 kg)  04/16/23 213 lb (96.6 kg)  03/06/23 206 lb 6.4 oz (93.6 kg)    Physical Exam Vitals reviewed.  Constitutional:      Appearance: Normal appearance.  HENT:     Nose: Nose normal.     Mouth/Throat:     Mouth: Mucous membranes are moist.  Eyes:     General: No scleral icterus.    Conjunctiva/sclera: Conjunctivae normal.  Cardiovascular:     Rate and Rhythm: Normal rate and regular rhythm.     Heart sounds: No murmur heard.    No friction rub. No gallop.  Pulmonary:     Effort: Pulmonary effort is normal.     Breath sounds: No stridor. No wheezing, rhonchi or rales.  Abdominal:     General: Abdomen is flat.     Palpations: There is no mass.     Tenderness: There is no abdominal tenderness. There is no guarding.     Hernia: No hernia  is present.  Musculoskeletal:        General: Normal range of motion.     Cervical back: Neck supple.     Right lower leg: No edema.     Left lower leg: No edema.  Lymphadenopathy:     Cervical: No cervical adenopathy.  Skin:    General: Skin is warm and dry.  Neurological:     General: No focal deficit present.     Mental Status: Mental status is at baseline.  Psychiatric:        Mood and Affect: Mood normal.  Behavior: Behavior normal.     Lab Results  Component Value Date   WBC 8.7 06/10/2023   HGB 13.9 06/10/2023   HCT 42.7 06/10/2023   PLT 301.0 06/10/2023   GLUCOSE 185 (H) 06/10/2023   CHOL 154 03/06/2023   TRIG 183.0 (H) 03/06/2023   HDL 54.70 03/06/2023   LDLDIRECT 86.0 09/04/2022   LDLCALC 63 03/06/2023   ALT 36 10/19/2022   AST 33 10/19/2022   NA 145 06/10/2023   K 4.5 06/10/2023   CL 102 06/10/2023   CREATININE 0.87 06/10/2023   BUN 14 06/10/2023   CO2 34 (H) 06/10/2023   TSH 0.70 04/16/2023   INR 1.3 (H) 04/16/2022   HGBA1C 7.1 (H) 06/10/2023   MICROALBUR 4.8 (H) 06/10/2023    No results found.  Assessment & Plan:   Type II diabetes mellitus with manifestations (HCC)- Her blood sugar is well controlled. -     Basic metabolic panel with GFR; Future -     Microalbumin / creatinine urine ratio; Future -     Hemoglobin A1c; Future -     Urinalysis, Routine w reflex microscopic; Future -     FreeStyle Libre 3 Sensor; Place 1 Act onto the skin daily.  Dispense: 2 each; Refill: 5 -     metFORMIN  HCl ER; Take 1 tablet (750 mg total) by mouth daily with breakfast.  Dispense: 90 tablet; Refill: 1 -     AMB Referral [facility] Care Management -     Rybelsus ; Take 1 tablet (7 mg total) by mouth daily.  Dispense: 90 tablet; Refill: 1  Essential hypertension, benign- Her SBP is high. Will restart an ARB. -     Basic metabolic panel with GFR; Future -     CBC with Differential/Platelet; Future -     Urinalysis, Routine w reflex microscopic; Future -      Olmesartan  Medoxomil; Take 1 tablet (20 mg total) by mouth daily.  Dispense: 90 tablet; Refill: 1 -     AMB Referral [facility] Care Management  Hyperlipidemia with target LDL less than 100 -     Atorvastatin  Calcium ; TAKE ONE TABLET BY MOUTH ONE TIME DAILY  Dispense: 90 tablet; Refill: 1 -     CK; Future     Follow-up: Return in about 6 months (around 12/10/2023).  Sandra Crouch, MD

## 2023-06-13 ENCOUNTER — Telehealth: Payer: Self-pay | Admitting: *Deleted

## 2023-06-13 NOTE — Progress Notes (Signed)
 Care Guide Pharmacy Note  06/13/2023 Name: [name] MRN: [id number] DOB: 10-Oct-1955  Referred By: Arcadio Knuckles, MD Reason for referral: Complex Care Management (Outreach to schedule referral with pharmacist )   Kirsten [name] is a 68 y.o. year old female who is a primary care patient of Arcadio Knuckles, MD.  [name] was referred to the pharmacist for assistance related to: DMII  Successful contact was made with the patient to discuss pharmacy services including being ready for the pharmacist to call at least 5 minutes before the scheduled appointment time and to have medication bottles and any blood pressure readings ready for review. The patient agreed to meet with the pharmacist via telephone visit on 06/27/2023  Kandis Ormond, CMA [place]  [facility], [facility] Guide Direct Dial: [phone]  Fax: [phone] Website: [email or web address]

## 2023-06-14 ENCOUNTER — Other Ambulatory Visit: Payer: Self-pay | Admitting: Internal Medicine

## 2023-06-19 ENCOUNTER — Ambulatory Visit (HOSPITAL_COMMUNITY)
Admission: RE | Admit: 2023-06-19 | Discharge: 2023-06-19 | Disposition: A | Discharge status: Home / Self Care | Source: Ambulatory Visit | Attending: Internal Medicine | Admitting: Internal Medicine

## 2023-06-19 DIAGNOSIS — J45909 Unspecified asthma, uncomplicated: Secondary | ICD-10-CM | POA: Diagnosis not present

## 2023-06-19 DIAGNOSIS — R059 Cough, unspecified: Secondary | ICD-10-CM | POA: Diagnosis not present

## 2023-06-19 DIAGNOSIS — R1310 Dysphagia, unspecified: Secondary | ICD-10-CM | POA: Diagnosis not present

## 2023-06-19 DIAGNOSIS — R12 Heartburn: Secondary | ICD-10-CM

## 2023-06-19 DIAGNOSIS — K219 Gastro-esophageal reflux disease without esophagitis: Secondary | ICD-10-CM | POA: Diagnosis not present

## 2023-06-19 DIAGNOSIS — I10 Essential (primary) hypertension: Secondary | ICD-10-CM | POA: Diagnosis not present

## 2023-06-19 DIAGNOSIS — R1312 Dysphagia, oropharyngeal phase: Secondary | ICD-10-CM | POA: Insufficient documentation

## 2023-06-19 DIAGNOSIS — K589 Irritable bowel syndrome without diarrhea: Secondary | ICD-10-CM | POA: Diagnosis not present

## 2023-06-19 DIAGNOSIS — G4733 Obstructive sleep apnea (adult) (pediatric): Secondary | ICD-10-CM | POA: Insufficient documentation

## 2023-06-19 NOTE — Therapy (Signed)
 Modified Barium Swallow Study  Patient Details  [name] MRN: [id number] Date of Birth: [birth year]  Today's Date: 06/19/2023  Modified Barium Swallow completed.  Full report located under Chart Review in the Imaging Section.  History of Present Illness Kirsten [name] is a 68 y.o. female with PMH: gastroschsis, GERD, HTN, IBS, anxiety, asthma, OSA on CPAP, SBO. She presented to this OP MBS as referred by GI NP secondary to patient c/o episodes of solids and liquid PO's getting stuck in back of throat and feeling as if her throat tightens up to where things cannot pass through.   Clinical Impression Patient presents with an oropharyngeal swallow that is WNL as per this MBS. Prominent criopharyngeal bar was present (see image below) but no significant disruption in barium flow/transit was observed. No penetration, no aspiration, no pharyngeal residuals. PES opening was WNL. Esophageal sweep did show questionable retrograde movement of barium below PES however image was faint. SLP recommending that patient consult with her GI MD if any further dysphagia symptoms.   Swallow Evaluation Recommendations Recommendations: PO diet PO Diet Recommendation: Regular;Thin liquids (Level 0) Liquid Administration via: Cup;Straw Medication Administration: Whole meds with liquid Postural changes: Stay upright 30-60 min after meals;Position pt fully upright for meals   Kirsten Mater, MA, CCC-SLP Speech Therapy

## 2023-06-22 DIAGNOSIS — J9611 Chronic respiratory failure with hypoxia: Secondary | ICD-10-CM | POA: Diagnosis not present

## 2023-06-22 DIAGNOSIS — J8410 Pulmonary fibrosis, unspecified: Secondary | ICD-10-CM | POA: Diagnosis not present

## 2023-06-24 ENCOUNTER — Telehealth: Payer: Self-pay | Admitting: Gastroenterology

## 2023-06-24 LAB — HM DIABETES EYE EXAM

## 2023-06-24 NOTE — Telephone Encounter (Signed)
 Spoke with patient about swallow study results and Dr. Milus Alpha recommendations for EGD.  Patient agrees to see it.  We will need cardiac clearance for Eliquis .  Also will need to hold semaglutide  for procedure.  Patient also states her diarrhea is still an issue.  She states starting the Benefiber 2 teaspoons daily has helped some but has intermittent explosive stools.  We discussed increasing Benefiber to 2 teaspoons twice daily.  Will also collect stool elastase.   Presly Steinruck, NP

## 2023-06-25 ENCOUNTER — Ambulatory Visit: Payer: Self-pay | Admitting: *Deleted

## 2023-06-25 ENCOUNTER — Telehealth: Payer: Self-pay | Admitting: *Deleted

## 2023-06-25 NOTE — Telephone Encounter (Signed)
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 If no improvement, and if patient desires, would then send for esophageal manometry. ----- Message ----- From: May, Deanna J, NP Sent: 06/19/2023   1:50 PM EDT To: Elois Hair, MD Subject: Follow-up                                      Dr. Cherryl Corona,    Good afternoon, I reviewed this swallow study and don't see anything to explain her current symptoms of throat tightening and feeling urge to choke. In this case if she is still persisting with symptoms would you proceed with esophageal manometry? Or do you think it would be worth trialing a PPI to see if it is esophageal spasms from reflux?  [name], NP ----- Message ----- From: Interface, Rad Results In Sent: 06/19/2023  11:27 AM EDT To: Devin Foerster May, NP

## 2023-06-25 NOTE — Telephone Encounter (Signed)
 See results follow up letter for additional information.

## 2023-06-25 NOTE — Telephone Encounter (Signed)
 PT returning call. Please advise.

## 2023-06-25 NOTE — Telephone Encounter (Signed)
 Kirsten [name] 20-Oct-1955 [id number]  06/25/2023   Dear Dr Parks Bollman:  We have scheduled the above named patient for an endoscopy procedure. Our records show that she is on anticoagulation therapy.  Please advise as to whether the patient is cleared from cardiology to move forward with procedure AND whether she may come off her therapy of Eliquis  2 days prior to their procedure which is to be scheduled..  Please fax your response as soon as possible to Jewelene Morton, RN to [phone].  Sincerely,    [facility]

## 2023-06-25 NOTE — Telephone Encounter (Signed)
 Letter faxed electronically and manually (fax [phone]) to Dr Percival Brace, patient's cardiologist at [facility].

## 2023-06-27 ENCOUNTER — Other Ambulatory Visit (INDEPENDENT_AMBULATORY_CARE_PROVIDER_SITE_OTHER): Admitting: Pharmacist

## 2023-06-27 DIAGNOSIS — I10 Essential (primary) hypertension: Secondary | ICD-10-CM

## 2023-06-27 DIAGNOSIS — E118 Type 2 diabetes mellitus with unspecified complications: Secondary | ICD-10-CM

## 2023-06-27 NOTE — Progress Notes (Unsigned)
   06/27/2023 [name] MRN: [id number] DOB: May 29, 1955  No chief complaint on file.   Kirsten [name] is a 68 y.o. year old female who presented for a telephone visit.   They were referred to the pharmacist by their PCP for assistance in managing diabetes and hypertension.   +Furosemide  20 mg daily Metoprolol  25 mg daily    Subjective:  Care Team: Primary Care Provider: Arcadio Knuckles, MD ; Next Scheduled Visit: *** {careteamprovider:27366}  Medication Access/Adherence  Current Pharmacy:  [facility] [address] [facility] - [place], [place] - [address] AT [facility] [address] [place] [address] Phone: [phone] Fax: [phone]   Patient reports affordability concerns with their medications: {YES/NO:21197} Patient reports access/transportation concerns to their pharmacy: {YES/NO:21197} Patient reports adherence concerns with their medications:  {YES/NO:21197} ***   Diabetes:  Current medications:  Medications tried in the past:   Current glucose readings: *** Using *** meter; testing *** times daily  Date of Download: *** % Time CGM is active: ***% Average Glucose: *** mg/dL Glucose Management Indicator: ***  Glucose Variability: *** (goal <36%) Time in Goal:  - Time in range 70-180: ***% - Time above range: ***% - Time below range: ***% Observed patterns:  Patient {Actions; denies-reports:120008} hypoglycemic s/sx including ***dizziness, shakiness, sweating. Patient {Actions; denies-reports:120008} hyperglycemic symptoms including ***polyuria, polydipsia, polyphagia, nocturia, neuropathy, blurred vision.  Current meal patterns:  - Breakfast: *** - Lunch *** - Supper *** - Snacks *** - Drinks ***  Current physical activity: ***  Current medication access support: ***  Hypertension:  Current medications: *** Medications previously tried:   Patient {HAS/DOES NOT ONGE:95284} a validated, automated, upper arm home  BP cuff Current blood pressure readings readings: ***  Patient {Actions; denies-reports:120008} hypotensive s/sx including ***dizziness, lightheadedness.  Patient {Actions; denies-reports:120008} hypertensive symptoms including ***headache, chest pain, shortness of breath  Current meal patterns: ***  Current physical activity: ***   Objective:  Lab Results  Component Value Date   HGBA1C 7.1 (H) 06/10/2023    Lab Results  Component Value Date   CREATININE 0.87 06/10/2023   BUN 14 06/10/2023   NA 145 06/10/2023   K 4.5 06/10/2023   CL 102 06/10/2023   CO2 34 (H) 06/10/2023    Lab Results  Component Value Date   CHOL 154 03/06/2023   HDL 54.70 03/06/2023   LDLCALC 63 03/06/2023   LDLDIRECT 86.0 09/04/2022   TRIG 183.0 (H) 03/06/2023   CHOLHDL 3 03/06/2023    Medications Reviewed Today   Medications were not reviewed in this encounter     To share your [facility] blood sugar readings with me, go in your [employer] app  Go to: Menu > Connected Apps > LibreView > Connect to a Education officer, community ID Our Practice ID is [email or web address]   Assessment/Plan:   {Pharmacy A/P Choices:26421}  Follow Up Plan: ***  ***

## 2023-06-27 NOTE — Telephone Encounter (Signed)
 We have received a faxed response from Barton Like, PA-C at [facility] indicating:  "Patient is clear to have endoscopy. She may hold Eliquis  2 days prior. Barton Like PA-C."  *see original fax under "media" tab in EPIC.

## 2023-06-27 NOTE — Telephone Encounter (Signed)
 Spoke to patient to advise of results/recommendations as per Arlon Lamb, NP and Dr Cherryl Corona. Patient is scheduled for endoscopy with Dr Cherryl Corona on 07/12/23 and eliquis /cardiac clearance has been requested from [facility]. Patient is already scheduled for office follow up with D.May on 07/02/23 and would like to know if this can be cancelled pending her 5/30 endoscopy. Please confirm if cancellation is okay.

## 2023-06-28 NOTE — Patient Instructions (Signed)
 It was a pleasure speaking with you today!  Continue your Rybelsus  and metformin  for blood sugars.  Reach out to your insurance to see about getting a blood pressure monitor through them. I will send information as well.  Feel free to call with any questions or concerns!  Rainelle Bur, PharmD, BCPS, CPP Clinical Pharmacist Practitioner [facility] at [facility] [phone]

## 2023-07-02 ENCOUNTER — Ambulatory Visit: Admitting: Gastroenterology

## 2023-07-05 NOTE — Telephone Encounter (Signed)
 Spoke to patient to confirm that she is aware she should hold eliquis  2 days prior to her upcoming endoscopy procedure. She is aware of this information. In addition, she is reminded that she should hold semiglutide for 7 days prior to her procedure. Patient asks if she should be NPO for testing. Advised clear liquids only after midnight and up until 3 hours prior to test. Patient is reminded that prep instructions are available in her mychart account and she should review these.

## 2023-07-07 DIAGNOSIS — J9611 Chronic respiratory failure with hypoxia: Secondary | ICD-10-CM | POA: Diagnosis not present

## 2023-07-07 DIAGNOSIS — J8410 Pulmonary fibrosis, unspecified: Secondary | ICD-10-CM | POA: Diagnosis not present

## 2023-07-09 DIAGNOSIS — G4733 Obstructive sleep apnea (adult) (pediatric): Secondary | ICD-10-CM | POA: Diagnosis not present

## 2023-07-12 ENCOUNTER — Ambulatory Visit: Admitting: Gastroenterology

## 2023-07-12 ENCOUNTER — Encounter: Payer: Self-pay | Admitting: Gastroenterology

## 2023-07-12 VITALS — BP 171/82 | HR 83 | Temp 98.8°F | Ht 67.0 in | Wt 213.0 lb

## 2023-07-12 MED ORDER — SODIUM CHLORIDE 0.9 % IV SOLN
500.0000 mL | INTRAVENOUS | Status: AC
Start: 2023-07-12 — End: 2023-07-13

## 2023-07-12 NOTE — Progress Notes (Signed)
 EGD canceled and will be rescheduled  to be done at the hospital endoscopy. Patient uses CPAP with 2 L O2 at night. Dr. Cherryl Corona and CRNA spoke to patient and explained.

## 2023-07-13 ENCOUNTER — Other Ambulatory Visit: Payer: Self-pay | Admitting: Internal Medicine

## 2023-07-13 DIAGNOSIS — F418 Other specified anxiety disorders: Secondary | ICD-10-CM

## 2023-07-15 DIAGNOSIS — L821 Other seborrheic keratosis: Secondary | ICD-10-CM | POA: Diagnosis not present

## 2023-07-16 DIAGNOSIS — G4733 Obstructive sleep apnea (adult) (pediatric): Secondary | ICD-10-CM | POA: Diagnosis not present

## 2023-07-18 ENCOUNTER — Encounter: Payer: Self-pay | Admitting: Internal Medicine

## 2023-07-18 ENCOUNTER — Other Ambulatory Visit: Payer: Self-pay

## 2023-07-18 ENCOUNTER — Telehealth: Payer: Self-pay

## 2023-07-18 DIAGNOSIS — M461 Sacroiliitis, not elsewhere classified: Secondary | ICD-10-CM | POA: Diagnosis not present

## 2023-07-18 DIAGNOSIS — R1312 Dysphagia, oropharyngeal phase: Secondary | ICD-10-CM | POA: Insufficient documentation

## 2023-07-18 NOTE — Telephone Encounter (Signed)
 Contacted patient and scheduled her for hospital procedure 08/05/23 @ 8:30am.

## 2023-07-18 NOTE — Addendum Note (Signed)
 Addended by: Melda Spore on: 07/18/2023 02:13 PM   Modules accepted: Orders

## 2023-07-22 ENCOUNTER — Telehealth: Payer: Self-pay | Admitting: Internal Medicine

## 2023-07-22 NOTE — Telephone Encounter (Signed)
 Thank you :)

## 2023-07-22 NOTE — Telephone Encounter (Signed)
 Returned patient's call. Patient reports that in the last 10 days she has been very nauseous, vomiting, no appetite, unable to eat food but is able to keep fluids down. She also notes some diarrhea. Denies abdominal pain. She increased Rybelsus  to 7 mg about 1 month ago. Tolerated 3 mg with some nausea that had improved over time/was tolerable. She notes her BG have been doing great. She denies any other change to diet/medications.  Instructed patient to stop Rybelsus . Advised her to contact the office if she is still unable to keep food down by Wednesday afternoon so that she can hopefully be seen Thursday/Friday. Stressed hydration. I have a follow up with her already scheduled for Monday 6/16.  Rainelle Bur, PharmD, BCPS, CPP Clinical Pharmacist Practitioner [facility] at [facility] [phone]

## 2023-07-22 NOTE — Telephone Encounter (Signed)
 Copied from CRM [id number]. Topic: Clinical - Medication Question >> Jul 22, 2023 10:02 AM Magdalene School wrote: Reason for CRM: Patient calling to speak with pharmacy patient outreach because she has some questions regarding Semaglutide  (RYBELSUS ) 7 MG TABS.

## 2023-07-23 ENCOUNTER — Ambulatory Visit: Payer: Self-pay

## 2023-07-23 DIAGNOSIS — J8410 Pulmonary fibrosis, unspecified: Secondary | ICD-10-CM | POA: Diagnosis not present

## 2023-07-23 DIAGNOSIS — J9611 Chronic respiratory failure with hypoxia: Secondary | ICD-10-CM | POA: Diagnosis not present

## 2023-07-23 NOTE — Telephone Encounter (Signed)
 FYI Only or Action Required?: FYI only for provider  Patient was last seen in primary care on 06/10/2023 by Arcadio Knuckles, MD. Called Nurse Triage reporting Medication Reaction. Symptoms began several days ago. Interventions attempted: Rest, hydration, or home remedies. Symptoms are: unchanged.  Triage Disposition: Call PCP Now  Patient/caregiver understands and will follow disposition?: Yes       Copied from CRM [id number]. Topic: Clinical - Red Word Triage >> Jul 23, 2023  1:38 PM Adonis Hoot wrote: Red Word that prompted transfer to Nurse Triage: nausea,vomiting,headache,diarrhea, no appetite from rybelsus  medication Reason for Disposition  [1] Caller has URGENT medicine question about med that PCP or specialist prescribed AND [2] triager unable to answer question  Answer Assessment - Initial Assessment Questions 1. NAME of MEDICINE: "What medicine(s) are you calling about?"     rebelus 2. QUESTION: "What is your question?" (e.g., double dose of medicine, side effect)     Side effects 3. PRESCRIBER: "Who prescribed the medicine?" Reason: if prescribed by specialist, call should be referred to that group.     Dr Rochelle Chu 4. SYMPTOMS: "Do you have any symptoms?" If Yes, ask: "What symptoms are you having?"  "How bad are the symptoms (e.g., mild, moderate, severe)     Nausea, vomiting, no appetite    States that she is able to keep fluids downs. States she has not vomited today.   Pt is questioning what should she do since she is stopping the Rebelus, should she just continue the one metformin  or does she need to increase the metformin .  Protocols used: Medication Question Call-A-AH

## 2023-07-25 ENCOUNTER — Other Ambulatory Visit: Payer: Self-pay

## 2023-07-25 ENCOUNTER — Encounter: Payer: Self-pay | Admitting: Family Medicine

## 2023-07-25 ENCOUNTER — Ambulatory Visit (INDEPENDENT_AMBULATORY_CARE_PROVIDER_SITE_OTHER): Admitting: Family Medicine

## 2023-07-25 ENCOUNTER — Emergency Department
Admission: EM | Admit: 2023-07-25 | Discharge: 2023-07-25 | Disposition: A | Discharge status: Home / Self Care | Attending: Emergency Medicine | Admitting: Emergency Medicine

## 2023-07-25 VITALS — BP 122/70 | HR 83 | Temp 98.3°F | Ht 67.0 in | Wt 197.6 lb

## 2023-07-25 DIAGNOSIS — R630 Anorexia: Secondary | ICD-10-CM | POA: Insufficient documentation

## 2023-07-25 DIAGNOSIS — M546 Pain in thoracic spine: Secondary | ICD-10-CM | POA: Diagnosis not present

## 2023-07-25 DIAGNOSIS — R110 Nausea: Secondary | ICD-10-CM | POA: Insufficient documentation

## 2023-07-25 DIAGNOSIS — E1165 Type 2 diabetes mellitus with hyperglycemia: Secondary | ICD-10-CM

## 2023-07-25 DIAGNOSIS — R197 Diarrhea, unspecified: Secondary | ICD-10-CM | POA: Insufficient documentation

## 2023-07-25 DIAGNOSIS — R34 Anuria and oliguria: Secondary | ICD-10-CM | POA: Insufficient documentation

## 2023-07-25 DIAGNOSIS — Z7901 Long term (current) use of anticoagulants: Secondary | ICD-10-CM | POA: Diagnosis not present

## 2023-07-25 DIAGNOSIS — R112 Nausea with vomiting, unspecified: Secondary | ICD-10-CM | POA: Insufficient documentation

## 2023-07-25 DIAGNOSIS — I10 Essential (primary) hypertension: Secondary | ICD-10-CM | POA: Insufficient documentation

## 2023-07-25 DIAGNOSIS — E876 Hypokalemia: Secondary | ICD-10-CM | POA: Diagnosis not present

## 2023-07-25 DIAGNOSIS — E119 Type 2 diabetes mellitus without complications: Secondary | ICD-10-CM | POA: Insufficient documentation

## 2023-07-25 LAB — CBC
HCT: 42.7 % (ref 36.0–46.0)
Hemoglobin: 14.1 g/dL (ref 12.0–15.0)
MCH: 29.9 pg (ref 26.0–34.0)
MCHC: 33 g/dL (ref 30.0–36.0)
MCV: 90.5 fL (ref 80.0–100.0)
Platelets: 322 10*3/uL (ref 150–400)
RBC: 4.72 MIL/uL (ref 3.87–5.11)
RDW: 13.3 % (ref 11.5–15.5)
WBC: 11.8 10*3/uL — ABNORMAL HIGH (ref 4.0–10.5)
nRBC: 0 % (ref 0.0–0.2)

## 2023-07-25 LAB — URINALYSIS, ROUTINE W REFLEX MICROSCOPIC
Bacteria, UA: NONE SEEN
Bilirubin Urine: NEGATIVE
Glucose, UA: NEGATIVE mg/dL
Hgb urine dipstick: NEGATIVE
Ketones, ur: NEGATIVE mg/dL
Nitrite: NEGATIVE
Protein, ur: NEGATIVE mg/dL
Specific Gravity, Urine: 1.011 (ref 1.005–1.030)
pH: 5 (ref 5.0–8.0)

## 2023-07-25 LAB — COMPREHENSIVE METABOLIC PANEL WITH GFR
ALT: 35 U/L (ref 0–44)
AST: 27 U/L (ref 15–41)
Albumin: 4 g/dL (ref 3.5–5.0)
Alkaline Phosphatase: 68 U/L (ref 38–126)
Anion gap: 12 (ref 5–15)
BUN: 14 mg/dL (ref 8–23)
CO2: 27 mmol/L (ref 22–32)
Calcium: 9.6 mg/dL (ref 8.9–10.3)
Chloride: 101 mmol/L (ref 98–111)
Creatinine, Ser: 0.76 mg/dL (ref 0.44–1.00)
GFR, Estimated: >60 mL/min (ref >60)
Glucose, Bld: 111 mg/dL — ABNORMAL HIGH (ref 70–99)
Potassium: 3 mmol/L — ABNORMAL LOW (ref 3.5–5.1)
Sodium: 140 mmol/L (ref 135–145)
Total Bilirubin: 0.6 mg/dL (ref 0.0–1.2)
Total Protein: 7 g/dL (ref 6.5–8.1)

## 2023-07-25 LAB — LIPASE, BLOOD: Lipase: 31 U/L (ref 11–51)

## 2023-07-25 MED ORDER — ONDANSETRON 4 MG PO TBDP: 4.0000 mg | ORAL_TABLET | Freq: Three times a day (TID) | ORAL | 0 refills | Status: DC | PRN

## 2023-07-25 MED ORDER — POTASSIUM CHLORIDE CRYS ER 20 MEQ PO TBCR
40.0000 meq | EXTENDED_RELEASE_TABLET | Freq: Once | ORAL | Status: AC
  Administered 2023-07-25: 40 meq via ORAL
  Filled 2023-07-25: qty 2

## 2023-07-25 MED ORDER — KETOROLAC TROMETHAMINE 30 MG/ML IJ SOLN
15.0000 mg | Freq: Once | INTRAMUSCULAR | Status: AC
  Administered 2023-07-25: 15 mg via INTRAVENOUS
  Filled 2023-07-25: qty 1

## 2023-07-25 MED ORDER — LACTATED RINGERS IV BOLUS
1000.0000 mL | Freq: Once | INTRAVENOUS | Status: AC
  Administered 2023-07-25: 1000 mL via INTRAVENOUS

## 2023-07-25 MED ORDER — ONDANSETRON HCL 4 MG/2ML IJ SOLN
4.0000 mg | Freq: Once | INTRAMUSCULAR | Status: AC
  Administered 2023-07-25: 4 mg via INTRAVENOUS
  Filled 2023-07-25: qty 2

## 2023-07-25 NOTE — ED Provider Notes (Signed)
 [facility] Provider Note    Event Date/Time   First MD Initiated Contact with Patient 07/25/23 1629     (approximate)   History   Chief Complaint Nausea (X 10 days)   HPI  CARTINA BROUSSEAU is a 68 y.o. female with past medical history of hypertension, diabetes, atrial fibrillation on Eliquis , pulmonary fibrosis, and GERD who presents to the ED complaining of nausea.  Patient reports that she had her dose of Rybelsus  increased about 10 days ago, subsequently developed significant nausea with vomiting.  She states that she has been able to keep down water, but no solids other than small amounts of toast.  She has been vomiting intermittently along with some diarrhea, denies pain in her abdomen but has had some soreness in her back.  She denies any dysuria or hematuria, has not had flank pain.  She stopped the Rybelsus  2 days ago but continues to have symptoms.     Physical Exam   Triage Vital Signs: ED Triage Vitals [07/25/23 1548]  Encounter Vitals Group     BP (!) 139/100     Girls Systolic BP Percentile      Girls Diastolic BP Percentile      Boys Systolic BP Percentile      Boys Diastolic BP Percentile      Pulse Rate 92     Resp 16     Temp 98.6 F (37 C)     Temp Source Oral     SpO2 95 %     Weight      Height      Head Circumference      Peak Flow      Pain Score 0     Pain Loc      Pain Education      Exclude from Growth Chart     Most recent vital signs: Vitals:   07/25/23 1548  BP: (!) 139/100  Pulse: 92  Resp: 16  Temp: 98.6 F (37 C)  SpO2: 95%    Constitutional: Alert and oriented. Eyes: Conjunctivae are normal. Head: Atraumatic. Nose: No congestion/rhinnorhea. Mouth/Throat: Mucous membranes are dry. Cardiovascular: Normal rate, regular rhythm. Grossly normal heart sounds.  2+ radial pulses bilaterally. Respiratory: Normal respiratory effort.  No retractions. Lungs CTAB. Gastrointestinal: Soft and nontender. No  distention. Musculoskeletal: No lower extremity tenderness nor edema.  Neurologic:  Normal speech and language. No gross focal neurologic deficits are appreciated.    ED Results / Procedures / Treatments   Labs (all labs ordered are listed, but only abnormal results are displayed) Labs Reviewed  COMPREHENSIVE METABOLIC PANEL WITH GFR - Abnormal; Notable for the following components:      Result Value   Potassium 3.0 (*)    Glucose, Bld 111 (*)    All other components within normal limits  CBC - Abnormal; Notable for the following components:   WBC 11.8 (*)    All other components within normal limits  URINALYSIS, ROUTINE W REFLEX MICROSCOPIC - Abnormal; Notable for the following components:   Color, Urine YELLOW (*)    APPearance HAZY (*)    Leukocytes,Ua SMALL (*)    All other components within normal limits  LIPASE, BLOOD    PROCEDURES:  Critical Care performed: No  Procedures   MEDICATIONS ORDERED IN ED: Medications  ondansetron  (ZOFRAN ) injection 4 mg (4 mg Intravenous Given 07/25/23 1648)  lactated ringers  bolus 1,000 mL (0 mLs Intravenous Stopped 07/25/23 1808)  potassium chloride  SA (KLOR-CON   M) CR tablet 40 mEq (40 mEq Oral Given 07/25/23 1648)  ketorolac  (TORADOL ) 30 MG/ML injection 15 mg (15 mg Intravenous Given 07/25/23 1806)     IMPRESSION / MDM / ASSESSMENT AND PLAN / ED COURSE  I reviewed the triage vital signs and the nursing notes.                              68 y.o. female with past medical history of hypertension, diabetes, atrial fibrillation on Eliquis , pulmonary fibrosis, and GERD who presents to the ED complaining of nausea, vomiting, and diarrhea for the past 10 days after starting on increased dose of Rybelsus .  Patient's presentation is most consistent with acute presentation with potential threat to life or bodily function.  Differential diagnosis includes, but is not limited to, medication side effect, gastroenteritis, dehydration,  electrolyte abnormality, AKI, pancreatitis, hepatitis, cholecystitis, biliary colic, UTI.  Patient nontoxic-appearing and in no acute distress, vital signs are unremarkable.  She appears clinically dehydrated but has a benign abdominal exam.  Labs show mild hypokalemia but no significant anemia, leukocytosis, or AKI noted.  LFTs and lipase are unremarkable, urinalysis without signs of infection.  We will treat symptomatically with IV Zofran , hydrate with IV fluids and reassess.  Patient reporting some back pain, was offered CT imaging of her abdomen/pelvis but declines.  She was given IV Toradol  with improvement in pain, is now tolerating oral intake without difficulty following Zofran .  She feels better overall and is appropriate for discharge home with outpatient follow-up.  She was counseled to return to the ED for new or worsening symptoms, patient agrees with plan.      FINAL CLINICAL IMPRESSION(S) / ED DIAGNOSES   Final diagnoses:  Nausea and vomiting, unspecified vomiting type     Rx / DC Orders   ED Discharge Orders          Ordered    ondansetron  (ZOFRAN -ODT) 4 MG disintegrating tablet  Every 8 hours PRN        07/25/23 1826             Note:  This document was prepared using Dragon voice recognition software and may include unintentional dictation errors.   Twilla Galea, MD 07/25/23 1827

## 2023-07-25 NOTE — Progress Notes (Signed)
   Acute Office Visit  Subjective:     Patient ID: Kirsten [name], female    DOB: July 30, 1955, 68 y.o.   MRN: [id number]  Chief Complaint  Patient presents with   Acute Visit    Has been having N/V and diarrhea since starting Rybelsus . Has been on it for 6 weeks, N/V for 2 weeks    HPI Patient is in today for evaluation of decreased appetite and nausea for the last week. Now reports right sided thoracic back pain Spoke to our pharmacist on Monday of this week and was told to discontinue Rybelsus .  States that that was her last day of the medication. Reports that she is very nauseated, vomiting, cannot tolerate even fluids, decreased urinary output. Denies  ROS Per HPI      Objective:    BP 122/70 (BP Location: Left Arm, Patient Position: Sitting)   Pulse 83   Temp 98.3 F (36.8 C) (Temporal)   Ht 5' 7 (1.702 m)   Wt 197 lb 9.6 oz (89.6 kg)   SpO2 93%   BMI 30.95 kg/m    Physical Exam Vitals and nursing note reviewed.  Constitutional:      General: She is not in acute distress.    Appearance: She is ill-appearing.  HENT:     Head: Normocephalic and atraumatic.     Right Ear: External ear normal.     Left Ear: External ear normal.     Nose: Nose normal.     Mouth/Throat:     Mouth: Mucous membranes are moist.     Pharynx: Oropharynx is clear.   Eyes:     Extraocular Movements: Extraocular movements intact.     Pupils: Pupils are equal, round, and reactive to light.    Cardiovascular:     Rate and Rhythm: Normal rate and regular rhythm.     Pulses: Normal pulses.     Heart sounds: Normal heart sounds.  Pulmonary:     Effort: Pulmonary effort is normal. No respiratory distress.     Breath sounds: Normal breath sounds. No wheezing, rhonchi or rales.   Musculoskeletal:        General: Normal range of motion.     Cervical back: Normal range of motion.     Right lower leg: No edema.     Left lower leg: No edema.  Lymphadenopathy:     Cervical: No  cervical adenopathy.   Neurological:     General: No focal deficit present.     Mental Status: She is alert and oriented to person, place, and time.   Psychiatric:        Mood and Affect: Mood normal.        Thought Content: Thought content normal.     No results found for any visits on 07/25/23.      Assessment & Plan:   Nausea  Decreased appetite  Decreased urine output  Acute right-sided thoracic back pain  Type 2 diabetes mellitus with hyperglycemia, without long-term current use of insulin  (HCC)  Given clinical presentation and symptoms, concern for acute pancreatitis, possible kidney injury due to dehydration. Discussed with patient that she would be best served in the emergency room for further evaluation and treatment.  She is agreeable to this. Declined EMS, states that she will have her husband take her.  No orders of the defined types were placed in this encounter.   Return if symptoms worsen or fail to improve.  Wellington Half, FNP

## 2023-07-25 NOTE — ED Triage Notes (Signed)
 Pt to ed from PCP office for nausea and vomiting for 10 days. Pt is caox4, in no acute distress and ambulatory in triage. PCP didn't collect any labs, sent her straight here.

## 2023-07-25 NOTE — Patient Instructions (Signed)
 Nausea, vomiting, decreased urinary output, back pain  Recently increased Rybelsus , suspect acute pancreatitis.

## 2023-07-29 ENCOUNTER — Other Ambulatory Visit: Payer: Self-pay

## 2023-07-29 ENCOUNTER — Other Ambulatory Visit: Payer: Self-pay | Admitting: Adult Health

## 2023-07-29 ENCOUNTER — Other Ambulatory Visit: Admitting: Pharmacist

## 2023-07-29 ENCOUNTER — Encounter (HOSPITAL_COMMUNITY): Payer: Self-pay | Admitting: Gastroenterology

## 2023-07-29 ENCOUNTER — Telehealth: Payer: Self-pay

## 2023-07-29 DIAGNOSIS — E1165 Type 2 diabetes mellitus with hyperglycemia: Secondary | ICD-10-CM

## 2023-07-29 NOTE — Progress Notes (Addendum)
 Anesthesia Review:  PCP: DR Mickeal Aland  Cardiologist : [facility] - clearance in media tab-dated 06/25/23.   PPM/ ICD: Device Orders: Rep Notified:  Chest x-ray : CT Chest- 02/19/23  EKG :03/12/23  Echo : 2023  Stress test: Cardiac Cath :  2023  PFT- 01/31/23   Activity level: can do a flight of stairs without difficuty  Sleep Study/ CPAP : has cpap uses 02-2L with cpap  Fasting Blood Sugar :      / Checks Blood Sugar -- times a day:     DM- type 2 Freestyle Libre  Metformin - none am of procedure  Blood Thinner/ Instructions /Last Dose: ASA / Instructions/ Last Dose :    Eliquis  - pt to call office of DR Cherryl Corona    6/122/25- In ED with n/V  Oxygen - at hs- 2L with cpap  07/25/23- cbc and CMP

## 2023-07-29 NOTE — Telephone Encounter (Signed)
 Procedure:EGD Procedure date: 08/05/23 Procedure location: wl Arrival Time: 715am Spoke with the patient Y/N: Letf VM on 6/16 at 4:13 to call back and confirm her procedure. Any prep concerns? n  Has the patient obtained the prep from the pharmacy ? n Do you have a care partner and transportation: . Any additional concerns? Aaron Aas

## 2023-07-29 NOTE — Patient Instructions (Addendum)
 It was a pleasure speaking with you today!  Remain off of Rybelsus  and continue metformin . We will monitor for blood sugars for 2 weeks and re-evaluate need for medication adjustment.  Feel free to call with any questions or concerns!  Rainelle Bur, PharmD, BCPS, CPP Clinical Pharmacist Practitioner [place] [facility] at [facility] [phone]

## 2023-07-29 NOTE — Progress Notes (Signed)
   07/29/23  [name] MRN: [id number] DOB: 11/02/1955  Chief Complaint  Patient presents with   Diabetes   Medication Management    Kirsten [name] is a 68 y.o. year old female who presented for a telephone visit.   They were referred to the pharmacist by their PCP for assistance in managing diabetes and hypertension.    Subjective:  Care Team: Primary Care Provider: Arcadio Knuckles, MD ; Next Scheduled Visit: not scheduled  Medication Access/Adherence  Current Pharmacy:  [facility] [address] [facility] - [place], [place] - [address] AT [facility] [address] [place] [address] Phone: [phone] Fax: [phone]   Patient reports affordability concerns with their medications: No  Patient reports access/transportation concerns to their pharmacy: No  Patient reports adherence concerns with their medications:  No    Diabetes:  Current medications: Metformin  XR 750 mg qAM Medications tried in the past: high doses of metformin  caused hypoglycemia with Rybelsus , Rybelsus  (d/c due to N/V/D)  - Pt ended up going to ED 6/12 due to N/V/D that continued 3 days after stopping Rybelsus . She received fluids, pain medication, and potassium while in the ED. Lipase was WNL and urinalysis showed no UTI. - Pt notes she feels much better now. N/V/D has resolved and her appetite is coming back. She has remained off of Rybelsus  but notes she did not experience any of these symptoms when she was on 3 mg dose  Current glucose readings:   Using Freestyle Libre 3 meter   Patient denies hypoglycemic s/sx including dizziness, shakiness, sweating.   Hypertension:  Current medications: Olmesartan  20 mg daily, metoprolol  succinate 25 mg daily, furosemide  20 mg daily Medications previously tried:   Patient does not have a validated, automated, upper arm home BP cuff Current blood pressure  readings: none  Patient reports hypotensive s/sx including dizziness  when moving directions  Objective:  Lab Results  Component Value Date   HGBA1C 7.1 (H) 06/10/2023    Lab Results  Component Value Date   CREATININE 0.76 07/25/2023   BUN 14 07/25/2023   NA 140 07/25/2023   K 3.0 (L) 07/25/2023   CL 101 07/25/2023   CO2 27 07/25/2023    Lab Results  Component Value Date   CHOL 154 03/06/2023   HDL 54.70 03/06/2023   LDLCALC 63 03/06/2023   LDLDIRECT 86.0 09/04/2022   TRIG 183.0 (H) 03/06/2023   CHOLHDL 3 03/06/2023    Medications Reviewed Today   Medications were not reviewed in this encounter     Assessment/Plan:   Diabetes: - Currently controlled per [employer]. A1c goal <7% - Recommend to continue on metformin  alone. Will wait a couple of weeks more off of Rybelsus  and assess her BG in 2 weeks.  - Pt wants to retry Rybelsus  3 mg and remain on it however reviewed that 3 mg is just the introductory dose, not meant to be maintenance. Could also increase metformin  dose if needed for better BG control.   Hypertension: - BP was well controlled hwen she was in office 6/12, then was high at ED   Follow Up Plan: 6/30  Kirsten [name], PharmD, BCPS, CPP Clinical Pharmacist Practitioner [facility] [phone]

## 2023-07-30 ENCOUNTER — Other Ambulatory Visit (HOSPITAL_COMMUNITY): Payer: Self-pay

## 2023-07-30 ENCOUNTER — Telehealth: Payer: Self-pay

## 2023-07-30 NOTE — Telephone Encounter (Signed)
*  Pulm  Pharmacy Patient Advocate Encounter   Received notification from CoverMyMeds that prior authorization for Breo Ellipta  100-25MCG/ACT aerosol powder  is required/requested.   Insurance verification completed.   The patient is insured through [facility] ADVANTAGE/RX ADVANCE .   Per test claim:  Brand Breo Ellipta  is preferred by the insurance.  If suggested medication is appropriate, Please send in a new RX and discontinue this one. If not, please advise as to why it's not appropriate so that we may request a Prior Authorization. Please note, some preferred medications may still require a PA.  If the suggested medications have not been trialed and there are no contraindications to their use, the PA will not be submitted, as it will not be approved.   Per test claim with [facility]- $47.00

## 2023-08-01 ENCOUNTER — Other Ambulatory Visit: Payer: Self-pay | Admitting: Internal Medicine

## 2023-08-01 ENCOUNTER — Telehealth: Payer: Self-pay | Admitting: Gastroenterology

## 2023-08-01 ENCOUNTER — Ambulatory Visit: Admitting: Emergency Medicine

## 2023-08-01 ENCOUNTER — Ambulatory Visit: Payer: Self-pay | Admitting: Internal Medicine

## 2023-08-01 DIAGNOSIS — J4541 Moderate persistent asthma with (acute) exacerbation: Secondary | ICD-10-CM

## 2023-08-01 MED ORDER — AZITHROMYCIN 250 MG PO TABS: ORAL_TABLET | ORAL | 0 refills | Status: DC

## 2023-08-01 MED ORDER — PREDNISONE 20 MG PO TABS
20.0000 mg | ORAL_TABLET | Freq: Every day | ORAL | 1 refills | Status: DC
Start: 2023-08-01 — End: 2023-10-01

## 2023-08-01 NOTE — Telephone Encounter (Signed)
 PT is scheduled for a colonoscopy on 6/23 at [facility] and just found out that she has bronchitis. They have her on antibiotics and she wants to know should she postpone the procedure. Please advise.

## 2023-08-01 NOTE — Telephone Encounter (Signed)
 I have notified the patient. She will go pick up the medications. She is okay with me canceling the appt that was made for her for today.   Nothing further needed.

## 2023-08-01 NOTE — Telephone Encounter (Signed)
 Copied from CRM [id number]. Topic: Clinical - Red Word Triage >> Aug 01, 2023 10:00 AM Tyronne Galloway wrote: Red Word that prompted transfer to Nurse Triage: Pt sees Dr. Auston Left in [place]. Pt stated she has a terrible productive cough, wheezing, and SOB for about 5 days. No appt has been made yet.   Reason for Disposition  Wheezing is present  Answer Assessment - Initial Assessment Questions 1. ONSET: When did the cough begin?      5 days ago 2. SEVERITY: How bad is the cough today?      Moderate  3. SPUTUM: Describe the color of your sputum (none, dry cough; clear, white, yellow, green)     Green/yellow 4. HEMOPTYSIS: Are you coughing up any blood? If so ask: How much? (flecks, streaks, tablespoons, etc.)     No 5. DIFFICULTY BREATHING: Are you having difficulty breathing? If Yes, ask: How bad is it? (e.g., mild, moderate, severe)    - MILD: No SOB at rest, mild SOB with walking, speaks normally in sentences, can lie down, no retractions, pulse < 100.    - MODERATE: SOB at rest, SOB with minimal exertion and prefers to sit, cannot lie down flat, speaks in phrases, mild retractions, audible wheezing, pulse 100-120.    - SEVERE: Very SOB at rest, speaks in single words, struggling to breathe, sitting hunched forward, retractions, pulse > 120      Mild  6. FEVER: Do you have a fever? If Yes, ask: What is your temperature, how was it measured, and when did it start?     No 7. CARDIAC HISTORY: Do you have any history of heart disease? (e.g., heart attack, congestive heart failure)      Yes 8. LUNG HISTORY: Do you have any history of lung disease?  (e.g., pulmonary embolus, asthma, emphysema)     Yes 9. PE RISK FACTORS: Do you have a history of blood clots? (or: recent major surgery, recent prolonged travel, bedridden)     No 10. OTHER SYMPTOMS: Do you have any other symptoms? (e.g., runny nose, wheezing, chest pain)       Wheezing  Protocols used: Cough - Acute  Productive-A-AH    FYI Only or Action Required?: FYI only for provider.  Patient is followed in Pulmonology for asthma, pulmonary fibrosis, last seen on 01/24/2023 by Cleve Dale, MD. Called Nurse Triage reporting Cough. Symptoms began several days ago. Symptoms are: gradually worsening.  Triage Disposition: See HCP Within 4 Hours (Or PCP Triage)  Patient/caregiver understands and will follow disposition?: Yes

## 2023-08-01 NOTE — Progress Notes (Signed)
 ABX and PRED ordered

## 2023-08-01 NOTE — Telephone Encounter (Signed)
 Dr. Auston Left, patient is scheduled to see Dr. Baldwin Levee today at 2:15pm. She is asking to be seen in [place].

## 2023-08-01 NOTE — Telephone Encounter (Signed)
 Pt was scheduled for colon at [facility] 6/23. She has been dx with URI prescribed antibiotics and prednisone . Cancelled her appt. She knows we will contact her to reschedule at next available hosp date.

## 2023-08-02 NOTE — Telephone Encounter (Signed)
 I have spoken to patient to advise of rescheduled endoscopy procedure with updated time/date/location and advised that updated instructions have also been made available in MyChart. Patient verbalizes understanding.

## 2023-08-02 NOTE — Telephone Encounter (Signed)
 Patient has been rescheduled to [facility] for her endoscopy procedure on 10/10/23 at 730 am, 6 am arrival.  Sent updated MyChart instructions and left message for patient to call back.

## 2023-08-05 ENCOUNTER — Other Ambulatory Visit: Payer: Self-pay | Admitting: Internal Medicine

## 2023-08-05 DIAGNOSIS — Z1231 Encounter for screening mammogram for malignant neoplasm of breast: Secondary | ICD-10-CM

## 2023-08-07 ENCOUNTER — Ambulatory Visit: Admitting: Internal Medicine

## 2023-08-07 ENCOUNTER — Ambulatory Visit: Payer: Self-pay | Admitting: Internal Medicine

## 2023-08-07 ENCOUNTER — Other Ambulatory Visit: Payer: Self-pay | Admitting: Internal Medicine

## 2023-08-07 ENCOUNTER — Encounter: Payer: Self-pay | Admitting: Internal Medicine

## 2023-08-07 VITALS — BP 120/80 | HR 90 | Temp 98.6°F | Ht 66.0 in | Wt 200.0 lb

## 2023-08-07 DIAGNOSIS — E118 Type 2 diabetes mellitus with unspecified complications: Secondary | ICD-10-CM

## 2023-08-07 DIAGNOSIS — J4541 Moderate persistent asthma with (acute) exacerbation: Secondary | ICD-10-CM | POA: Diagnosis not present

## 2023-08-07 MED ORDER — LEVOFLOXACIN 750 MG PO TABS: 750.0000 mg | ORAL_TABLET | Freq: Every day | ORAL | 0 refills | Status: AC

## 2023-08-07 MED ORDER — METHYLPREDNISOLONE ACETATE 80 MG/ML IJ SUSP
80.0000 mg | Freq: Once | INTRAMUSCULAR | Status: AC
Start: 2023-08-07 — End: 2023-08-07
  Administered 2023-08-07: 80 mg via INTRAMUSCULAR

## 2023-08-07 MED ORDER — RYBELSUS 3 MG PO TABS: 3.0000 mg | ORAL_TABLET | Freq: Every day | ORAL | 0 refills | Status: DC

## 2023-08-07 MED ORDER — PREDNISONE 20 MG PO TABS: 20.0000 mg | ORAL_TABLET | Freq: Every day | ORAL | 1 refills | Status: DC

## 2023-08-07 MED ORDER — IPRATROPIUM-ALBUTEROL 0.5-2.5 (3) MG/3ML IN SOLN
3.0000 mL | Freq: Once | RESPIRATORY_TRACT | Status: AC
  Administered 2023-08-07: 3 mL via RESPIRATORY_TRACT

## 2023-08-07 NOTE — Telephone Encounter (Signed)
 Noted. Nothing further needed.

## 2023-08-07 NOTE — Telephone Encounter (Signed)
 FYI Only or Action Required?: FYI only for provider.  Patient is followed in Pulmonology, last seen on 01/24/2023 by Isaiah Scrivener, MD. Called Nurse Triage reporting Cough. Symptoms began several weeks ago. Interventions attempted: Prescription medications: antibiotic, prednisone . Symptoms are: unchanged.  Triage Disposition: See HCP Within 4 Hours (Or PCP Triage)  Patient/caregiver understands and will follow disposition?: Yes                           Copied from CRM [id number]. Topic: Clinical - Red Word Triage >> Aug 07, 2023  8:58 AM Russell PARAS wrote: Red Word that prompted transfer to Nurse Triage:   Pt was seen by Kasa last week for symptoms. Was prescribed prednisone  and antibiotic, has completed both Still having congestion Coughing up green phlegm Productive cough Wheezing present when laying down. Reason for Disposition  Wheezing is present  Answer Assessment - Initial Assessment Questions Patient was seen in office last week and was prescribed prednisone  and an antibiotic.   Pt states she has a strong history of pneumonia. Pt states she feels the same, not better or worse than last week Denies fever, hemoptysis, chest pain Productive cough started 2 weeks ago- green phlegm Wheezing when laying down  Protocols used: Cough - Acute Productive-A-AH

## 2023-08-07 NOTE — Progress Notes (Signed)
 @Patient  ID: Kirsten [name], female    DOB: July 14, 1955, 68 y.o.   MRN: [id number]  TEST/EVENTS :  PFT 09/20/09 >> FEV1 1.55 (57%), FEV1% 83, TLC 3.00 (54%), DLCO 58% RAST 11/02/15 >> negative IgE 10/28/20 >> 3   Sleep Tests:  PSG 07/22/08 >> AHI 80.4, SpO2 low 82% CPAP 02/22/21 to 02/27/21 >> used on 6 of 6 nights with average 7 hrs 3 min.  Average AHI 0.7 with CPAP 8 cm H2O   Cardiac Tests:  Echo 12/12/20 >> EF greater than 55%, mild LVH, grade 2 DD  SYNOPSIS 68 yo female never smoker followed for OSA and Asthma  Medical history significant for diastolic heart failure, A-fib on amiodarone  and Eliquis  and Diabetes  Hospitalization August 2023 stress-induced cardiomyopathy after severe traumatic fall with multiple left-sided rib fractures and left clavicular fracture hospital stay was complicated by hypercarbic and hypoxic respiratory failure.  Initial echo showed EF at 35 to 45%.  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  August 2024 Admitted to the hospital for left lower lobe pneumonia and sepsis Patient was discharged on oxygen  therapy Also has stress-induced cardiomyopathy  CT chest August 2024 reviewed in detail with patient + left lower lobe pneumonia Repeat CT chest August 2024 shows resolution of left lower lobe pneumonia   CC Follow up assessment for ASTHMA Follow-up assessment for OSA  Previous history of left lower lobe pneumonia  HPI: Patient with asthma exacerbation was given prednisone  and  antibiotics Patient is here for acute office visit for shortness of breath Plan for nebulizer in office today Plan for Depo-Medrol  shot in office today Moderate persistent asthma due to asthma exacerbation Patient with wheezing and cough  Patient with acute asthma exacerbation at this time We will prescribe more prednisone  and alternative antibiotics with Levaquin    Allergies  Allergen Reactions   Doxycycline  Rash   Factive [Gemifloxacin Mesylate] Rash   Sulfonamide Derivatives Rash    REACTION: rash   Crestor  [Rosuvastatin ]     GI upset   Gemifloxacin Rash   Penicillins Hives and Rash    Has patient had a PCN reaction causing immediate rash, facial/tongue/throat swelling, SOB or lightheadedness with hypotension: No Has patient had a PCN reaction causing severe rash involving mucus membranes or skin necrosis: No Has patient had a PCN reaction that required hospitalization No Has patient had a PCN reaction occurring within the last 10 years: No If all of the above answers are NO, then may proceed with Cephalosporin use.     Immunization History  Administered Date(s) Administered   Influenza Split 11/29/2010, 11/23/2011   Influenza Whole 11/12/2009   Influenza,inj,Quad PF,6+ Mos 10/22/2012, 12/31/2013, 11/02/2014, 11/28/2015, 11/23/2016, 12/25/2017, 11/03/2018, 11/16/2019, 12/20/2020, 11/01/2021   PFIZER(Purple Top)SARS-COV-2 Vaccination 04/25/2019, 05/20/2019, 02/08/2020   PNEUMOCOCCAL CONJUGATE-20 12/20/2020   Pneumococcal Polysaccharide-23 11/23/2011, 11/16/2019   Td 11/01/2021   Tdap 08/02/2010    Past Medical History:  Diagnosis Date   A-fib [facility])    Anxiety disorder    Arthritis    Asthma    Gastroschisis    umphalocele, rotated organs abd  repair until age 73   GERD (gastroesophageal reflux disease)    HTN (hypertension)    Hyperlipidemia    Hypothyroidism    IBS (irritable bowel syndrome)    Morbid obesity (HCC)    Target wt - 185  for BMI < 30   Obesity     OSA on CPAP    uses with 2L O2   Pneumonia    PONV (postoperative nausea and vomiting)    SBO (small bowel obstruction) (HCC)    Resolved with NG/Bowel rest around 2009   Sleep apnea    Type II or unspecified type diabetes mellitus without mention of complication, not stated as uncontrolled     Tobacco History: Social History   Tobacco Use  Smoking Status Never  Smokeless Tobacco Never  Tobacco Comments   tried as a teen   Counseling given: Not Answered Tobacco comments: tried as a teen   Outpatient Medications Prior to Visit  Medication Sig Dispense Refill   albuterol  (PROVENTIL ) (2.5 MG/3ML) 0.083% nebulizer solution Take 3 mLs (2.5 mg total) by nebulization every 4 (four) hours as needed for wheezing or shortness of breath. 75 mL 10   albuterol  (VENTOLIN  HFA) 108 (90 Base) MCG/ACT inhaler Inhale 2 puffs into the lungs every 6 (six) hours as needed for wheezing or shortness of breath. 8 g 2   ALPRAZolam  (XANAX ) 0.25 MG tablet Take 1 tablet (0.25 mg total) by mouth 3 (three) times daily as needed for anxiety. 270 tablet 0   amiodarone  (PACERONE ) 200 MG tablet Take 1 tablet (200 mg total) by mouth daily. 90 tablet 0   atorvastatin  (LIPITOR) 40 MG tablet TAKE ONE TABLET BY MOUTH ONE TIME DAILY 90 tablet 1   azithromycin  (ZITHROMAX  Z-PAK) 250 MG tablet Take 2 tablets on Day 1 and then 1 tablet daily till gone. 6 each 0   BREO ELLIPTA  100-25 MCG/ACT AEPB INHALE ONE PUFF BY MOUTH ONE TIME DAILY 60 each 0   Continuous Glucose Sensor (FREESTYLE LIBRE 3 SENSOR) MISC Place 1 Act onto the skin daily. 2 each 5   cyanocobalamin  (,VITAMIN B-12,) 1000 MCG/ML injection INJECT 1ML INTO THE MUSCLE ONCE A MONTH 10 mL 0   ELIQUIS  5 MG TABS tablet TAKE ONE TABLET BY MOUTH TWICE A DAY 180 tablet 1   fluticasone  (FLONASE ) 50 MCG/ACT nasal spray Place 1 spray into both nostrils daily. 18 mL 2   furosemide  (LASIX ) 20 MG tablet Take 20 mg by mouth daily.     Insulin  Pen Needle 32G X 6 MM MISC 1  Act by Does not apply route once a week. 30 each 1   levothyroxine  (SYNTHROID ) 175 MCG tablet Take 1 tablet (175 mcg total) by mouth daily. 90 tablet 0   losartan  (COZAAR ) 25 MG tablet Take 25 mg by mouth daily.     metFORMIN  (GLUCOPHAGE -XR) 750 MG 24 hr tablet Take 1 tablet (750 mg total) by mouth daily with breakfast. 90 tablet 1   metoprolol  succinate (TOPROL -XL) 25 MG 24 hr tablet Take 0.5 tablets (12.5 mg total) by mouth daily. (Patient taking differently: Take 25 mg by mouth daily.) 15 tablet 0   ondansetron  (ZOFRAN -ODT) 4 MG disintegrating tablet Take 1 tablet (4 mg total) by mouth every 8 (eight) hours as needed for nausea or vomiting. 12 tablet 0   OXYGEN  Inhale 2 L into the lungs at bedtime. Uses with CPAP     PARoxetine  (PAXIL -CR) 25 MG 24 hr tablet TAKE ONE TABLET BY MOUTH ONE TIME DAILY  90 tablet 1   predniSONE  (DELTASONE ) 20 MG tablet Take 1 tablet (20 mg total) by mouth daily with breakfast. 7 days 7 tablet 1   torsemide (DEMADEX) 20 MG tablet Take 20 mg by mouth as needed (Edema in legs).     No facility-administered medications prior to visit.   BP 120/80 (BP Location: Right Arm, Patient Position: Sitting, Cuff Size: Normal)   Pulse 90   Temp 98.6 F (37 C) (Oral)   Ht 5' 6 (1.676 m)   Wt 200 lb (90.7 kg)   SpO2 93%   BMI 32.28 kg/m   Review of Systems: Gen:  Denies  fever, sweats, chills weight loss  HEENT: Denies blurred vision, double vision, ear pain, eye pain, hearing loss, nose bleeds, sore throat Cardiac:  No dizziness, chest pain or heaviness, chest tightness,edema, No JVD Resp:   + cough, +sputum production, +shortness of breath,+wheezing, -hemoptysis,  Other:  All other systems negative   Physical Examination:   General Appearance: No distress  EYES PERRLA, EOM intact.   NECK Supple, No JVD Pulmonary: normal breath sounds, +wheezing.  Diminished breath sounds in the left lower base CardiovascularNormal S1,S2.  No m/r/g.   Abdomen: Benign, Soft,  non-tender. Neurology UE/LE 5/5 strength, no focal deficits Ext pulses intact, cap refill intact ALL OTHER ROS ARE NEGATIVE         Assessment & Plan:   68 year old pleasant white female seen today for acute asthma exacerbation   Previous diagnosis of  underlying severe sleep apnea with a diagnosis of stress-induced cardiomyopathy with EF 35% with a previous diagnosis of left lower lobe pneumonia and consolidation based on CT scan imaging status post fall clavicular fracture and broken ribs   Acute asthma exacerbation Depo-Medrol  shot in the office DuoNeb treatment in the office Prednisone  20 mg daily for 10 days Levaquin  750 mg daily for 7 days Recommend going to ER or urgent care if symptoms worsen Consider testing for flu RSV COVID at ER or urgent care if symptoms worsen  Avoid Allergens and Irritants Avoid secondhand smoke Avoid SICK contacts Recommend  Masking  when appropriate Recommend Keep up-to-date with vaccinations  Continue CPAP therapy for OSA as tolerated  Diabetes Mellitis - Sleep apnea can contribute to DM, therefore treatment of sleep apnea is important part of DM management.  Atrial Fibrillation - Sleep apnea can contribute to Atrial Fibrillation, therefore treatment of sleep apnea is important part of A fib management.   Chronic hypoxic respiratory failure Intermittent use of oxygen  when extremely short of breath Patient monitors her oxygen  levels always at home   Stress-induced cardiomyopathy Appears euvolemic on exam.  Continue follow-up with cardiology  MEDICATION ADJUSTMENTS/LABS AND TESTS ORDERED: Recommend Levaquin  750 mg 1 tablet daily for the next 7 days Recommend prednisone  20 mg daily for the next 10 days We will prescribe albuterol  nebulizer in the office today We will prescribe 80 mg of Depo-Medrol  steroid shot Recommend going to urgent care or ER if symptoms get worse Recommend DayQuil during the day for cough and  congestion Recommend NyQuil at night for cough and congestion Avoid Allergens and Irritants Avoid secondhand smoke Avoid SICK contacts Recommend  Masking  when appropriate Recommend Keep up-to-date with vaccinations   CURRENT MEDICATIONS REVIEWED AT LENGTH WITH PATIENT TODAY   Patient  satisfied with Plan of action and management. All questions answered   Follow up 3 months   I spent a total of 58 minutes reviewing chart data, face-to-face evaluation with the patient,  counseling and coordination of care as detailed above.      Nickolas Alm Cellar, M.D.  [facility]  Medical Director [facility] Medical Director [facility] Cardio-Pulmonary Department

## 2023-08-07 NOTE — Patient Instructions (Addendum)
 Recommend Levaquin  750 mg 1 tablet daily for the next 7 days Recommend prednisone  20 mg daily for the next 10 days  We will prescribe albuterol  nebulizer in the office today We will prescribe 80 mg of Depo-Medrol  steroid shot  Recommend going to urgent care or ER if symptoms get worse  Recommend DayQuil during the day for cough and congestion Recommend NyQuil at night for cough and congestion  Avoid Allergens and Irritants Avoid secondhand smoke Avoid SICK contacts Recommend  Masking  when appropriate Recommend Keep up-to-date with vaccinations

## 2023-08-08 ENCOUNTER — Telehealth: Payer: Self-pay

## 2023-08-08 NOTE — Telephone Encounter (Signed)
 LVM to schedule orthotic PU

## 2023-08-12 ENCOUNTER — Other Ambulatory Visit (INDEPENDENT_AMBULATORY_CARE_PROVIDER_SITE_OTHER): Admitting: Pharmacist

## 2023-08-12 ENCOUNTER — Other Ambulatory Visit: Payer: Self-pay | Admitting: Internal Medicine

## 2023-08-12 DIAGNOSIS — E039 Hypothyroidism, unspecified: Secondary | ICD-10-CM

## 2023-08-12 DIAGNOSIS — E118 Type 2 diabetes mellitus with unspecified complications: Secondary | ICD-10-CM

## 2023-08-12 NOTE — Patient Instructions (Signed)
 It was a pleasure speaking with you today!  Continue metformin  and restart Rybelsus  3 mg daily. Contact me if you have any issues tolerating Rybelsus .  Feel free to call with any questions or concerns!  Darrelyn Drum, PharmD, BCPS, CPP Clinical Pharmacist Practitioner [facility] [phone]

## 2023-08-12 NOTE — Progress Notes (Signed)
 08/12/23  [name] MRN: [id number] DOB: 10-05-1955  Chief Complaint  Patient presents with   Diabetes   Medication Management    Kirsten [name] is a 68 y.o. year old female who presented for a telephone visit.   They were referred to the pharmacist by their PCP for assistance in managing diabetes and hypertension.    Subjective:  Care Team: Primary Care Provider: Joshua Debby CROME, MD ; Next Scheduled Visit: not scheduled  Medication Access/Adherence  Current Pharmacy:  [facility] [address] [facility] - [place], [place] - [address] AT [facility] [address] [place] [address] Phone: [phone] Fax: [phone]   Patient reports affordability concerns with their medications: No  Patient reports access/transportation concerns to their pharmacy: No  Patient reports adherence concerns with their medications:  No    Diabetes:  Current medications: Metformin  XR 750 mg qAM Medications tried in the past: high doses of metformin  caused hypoglycemia with Rybelsus , Rybelsus  7mg  (d/c due to N/V/D)  - She notes PCP agreed with retrying the Rybelsus  3 mg since she tolerated it well without side effects. She has not yet restarted since she is waiting for the pharmacy to get it in stock.  Current glucose readings:   Using Freestyle Libre 3 meter   Patient denies hypoglycemic s/sx including dizziness, shakiness, sweating.   Objective:  Lab Results  Component Value Date   HGBA1C 7.1 (H) 06/10/2023    Lab Results  Component Value Date   CREATININE 0.76 07/25/2023   BUN 14 07/25/2023   NA 140 07/25/2023   K 3.0 (L) 07/25/2023   CL 101 07/25/2023   CO2 27 07/25/2023    Lab Results  Component Value Date   CHOL 154 03/06/2023   HDL 54.70 03/06/2023   LDLCALC 63 03/06/2023   LDLDIRECT 86.0 09/04/2022   TRIG 183.0 (H) 03/06/2023   CHOLHDL 3 03/06/2023    Medications Reviewed Today     Reviewed by Merceda Lela SAUNDERS, RPH  (Pharmacist) on 08/12/23 at 1631  Med List Status: <None>   Medication Order Taking? Sig Documenting Provider Last Dose Status Informant  albuterol  (PROVENTIL ) (2.5 MG/3ML) 0.083% nebulizer solution [id number]  Take 3 mLs (2.5 mg total) by nebulization every 4 (four) hours as needed for wheezing or shortness of breath. Kasa, Kurian, MD  Active   albuterol  (VENTOLIN  HFA) 108 (90 Base) MCG/ACT inhaler [id number]  Inhale 2 puffs into the lungs every 6 (six) hours as needed for wheezing or shortness of breath. Amin, Sumayya, MD  Active Self  ALPRAZolam  (XANAX ) 0.25 MG tablet [id number]  Take 1 tablet (0.25 mg total) by mouth 3 (three) times daily as needed for anxiety. Joshua Debby CROME, MD  Active Self  amiodarone  (PACERONE ) 200 MG tablet [id number]  Take 1 tablet (200 mg total) by mouth daily. Fernand Denyse DELENA, MD  Active   atorvastatin  (LIPITOR) 40 MG tablet [id number]  TAKE ONE TABLET BY MOUTH ONE TIME DAILY Joshua Debby CROME, MD  Active   azithromycin  (ZITHROMAX  Z-PAK) 250 MG tablet [id number]  Take 2 tablets on Day 1 and then 1 tablet daily till gone. Kasa, Kurian, MD  Active   BREO ELLIPTA  100-25 MCG/ACT AEPB [id number]  INHALE ONE PUFF BY MOUTH ONE TIME DAILY Parrett, Madelin RAMAN, NP  Active   Continuous Glucose Sensor (FREESTYLE LIBRE 3 SENSOR) [place] [id number]  Place 1 Act onto the skin daily. Joshua Debby CROME, MD  Active   cyanocobalamin  (,VITAMIN B-12,) 1000 MCG/ML  injection [id number]  INJECT INTO THE MUSCLE ONCE A MONTH Patel, Donika K, DO  Active Self, Pharmacy Records  ELIQUIS  5 MG TABS tablet [id number]  TAKE ONE TABLET BY MOUTH TWICE A DAY Joshua Debby CROME, MD  Active   fluticasone  (FLONASE ) 50 MCG/ACT nasal spray [id number]  Place 1 spray into both nostrils daily. Caleen Qualia, MD  Active Self  furosemide  (LASIX ) 20 MG tablet [id number]  Take 20 mg by mouth daily. [provider]  Active   Insulin  Pen Needle 32G X 6 MM MISC [id number]  1 Act by Does not apply route once a week. Joshua Debby CROME, MD   Active   levofloxacin  (LEVAQUIN ) 750 MG tablet [id number]  Take 1 tablet (750 mg total) by mouth daily for 7 days. Kasa, Kurian, MD  Active   levothyroxine  (SYNTHROID ) 175 MCG tablet [id number]  TAKE ONE TABLET BY MOUTH ONE TIME DAILY Joshua Debby CROME, MD  Active   losartan  (COZAAR ) 25 MG tablet [id number]  Take 25 mg by mouth daily. [provider]  Active   metFORMIN  (GLUCOPHAGE -XR) 750 MG 24 hr tablet [id number] Yes Take 1 tablet (750 mg total) by mouth daily with breakfast. Joshua Debby CROME, MD  Active   metoprolol  succinate (TOPROL -XL) 25 MG 24 hr tablet [id number]  Take 0.5 tablets (12.5 mg total) by mouth daily.  Patient taking differently: Take 25 mg by mouth daily.   Trudy Anthony HERO, MD  Expired 07/25/23 2359 Self           Med Note ([place] , JAZUNIQUE M   Mon Jun 10, 2023 10:09 AM)    ondansetron  (ZOFRAN -ODT) 4 MG disintegrating tablet [id number]  Take 1 tablet (4 mg total) by mouth every 8 (eight) hours as needed for nausea or vomiting. Willo Dunnings, MD  Active   OXYGEN  [id number]  Inhale 2 L into the lungs at bedtime. Uses with CPAP [provider]  Active   PARoxetine  (PAXIL -CR) 25 MG 24 hr tablet [id number]  TAKE ONE TABLET BY MOUTH ONE TIME DAILY Joshua Debby CROME, MD  Active   predniSONE  (DELTASONE ) 20 MG tablet [id number]  Take 1 tablet (20 mg total) by mouth daily with breakfast. 7 days Kasa, Kurian, MD  Active   predniSONE  (DELTASONE ) 20 MG tablet [id number]  Take 1 tablet (20 mg total) by mouth daily with breakfast. 10 days Kasa, Kurian, MD  Active   Semaglutide  (RYBELSUS ) 3 MG TABS [id number]  Take 1 tablet (3 mg total) by mouth daily.  Patient not taking: Reported on 08/12/2023   Joshua Debby CROME, MD  Active   torsemide (DEMADEX) 20 MG tablet [id number]  Take 20 mg by mouth as needed (Edema in legs). [provider]  Active             Assessment/Plan:   Diabetes: - Currently controlled per [name] data. A1c goal <7% - Recommend to  continue metformin  and restart Rybelsus  3mg  - Advised her to call if she has issues tolerating the 3 mg   Follow Up Plan: PRN - PCP f/u in October  Darrelyn Drum, PharmD, BCPS, CPP Clinical Pharmacist Practitioner [facility] [phone]

## 2023-08-13 IMAGING — CR DG CHEST 2V
2 series · 2 of 2 positions shown · non-contrast
Comparison: 09/27/2020 chest radiographs and earlier.

CLINICAL DATA: 65-year-old female with shortness of breath, cough.

EXAM:
CHEST - 2 VIEW

[chest pa]
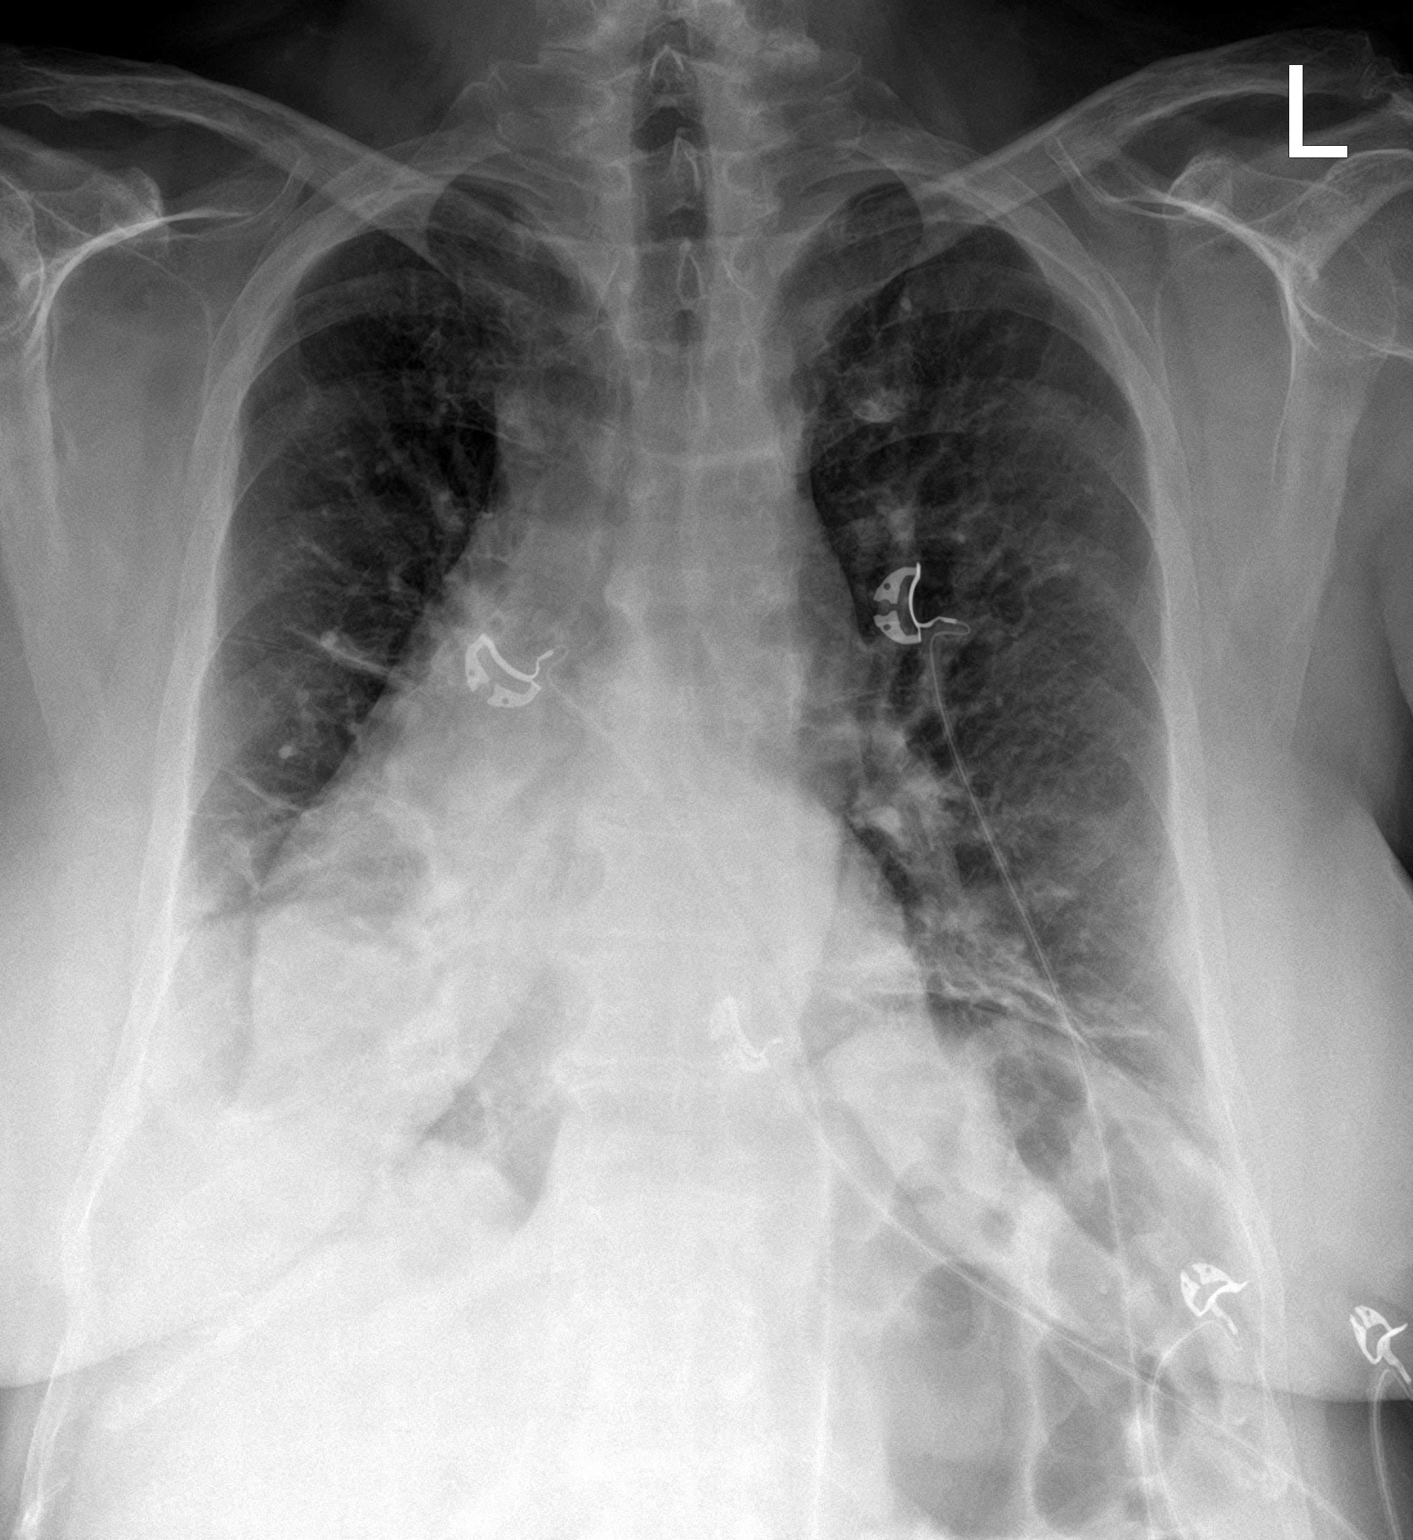

[chest lat]
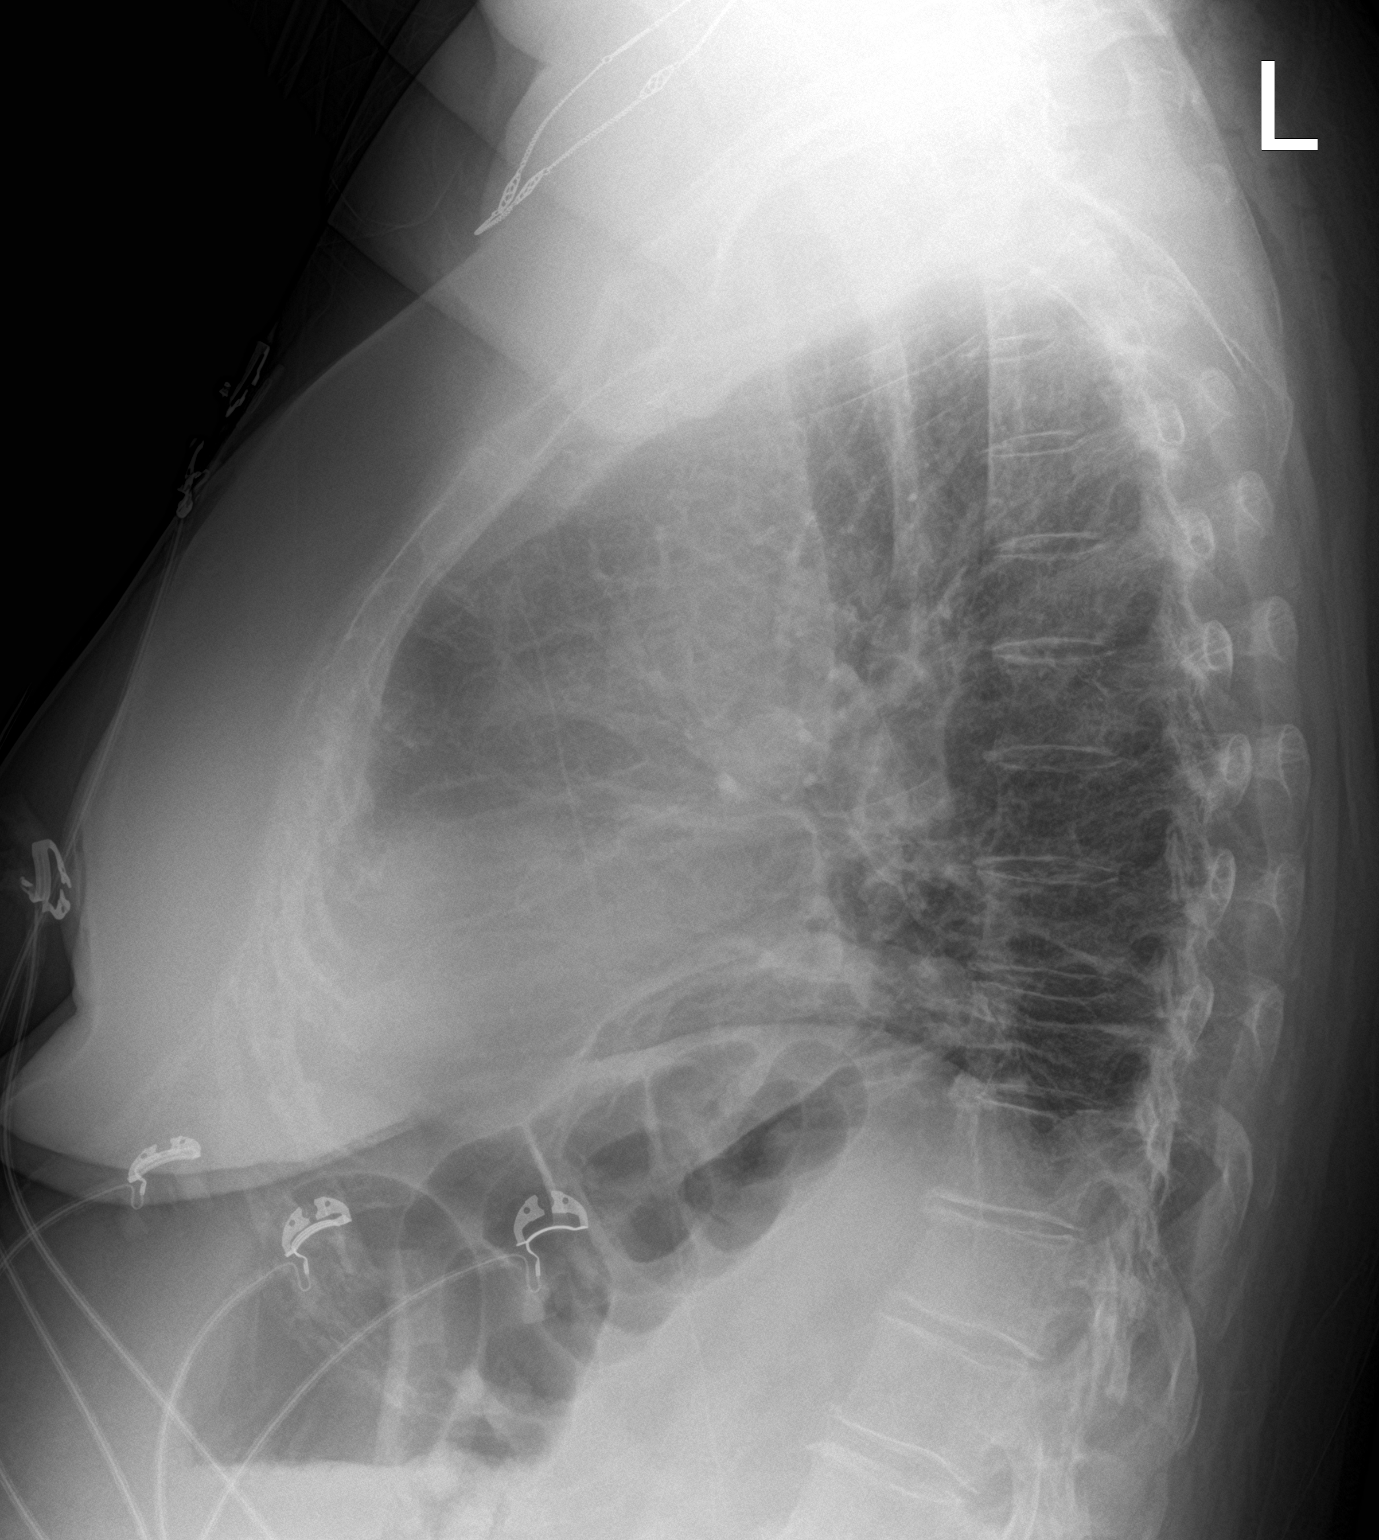

[2 of 2 positions shown; findings below may reference images not displayed]

FINDINGS: Chronic volume loss in the right hemithorax, not significantly
changed from a chest CT last year. Stable lung volumes and
mediastinal contours. Chronic elevation of the right hemidiaphragm
and patchy right lung base opacity is stable since [REDACTED]. Streaky
left lung base opacity has mildly increased. No pneumothorax,
pulmonary edema or pleural effusion. Visualized tracheal air column
is within normal limits. No acute osseous abnormality identified.
IMPRESSION: 1. Difficult to exclude acute infection at the left lung base, where
opacity has increased but is superimposed on chronic atelectasis or
scarring. No pleural effusion.
2. Ongoing volume loss in the right chest, not significantly changed
from a CT last year which demonstrated no obstructing airway lesion.

## 2023-08-18 IMAGING — DX DG CHEST 1V PORT
1 series · 1 of 1 positions shown · non-contrast
Comparison: Previous studies including the examination of
12/04/2020

CLINICAL DATA: Shortness of breath

EXAM:
PORTABLE CHEST 1 VIEW

[chest ap]
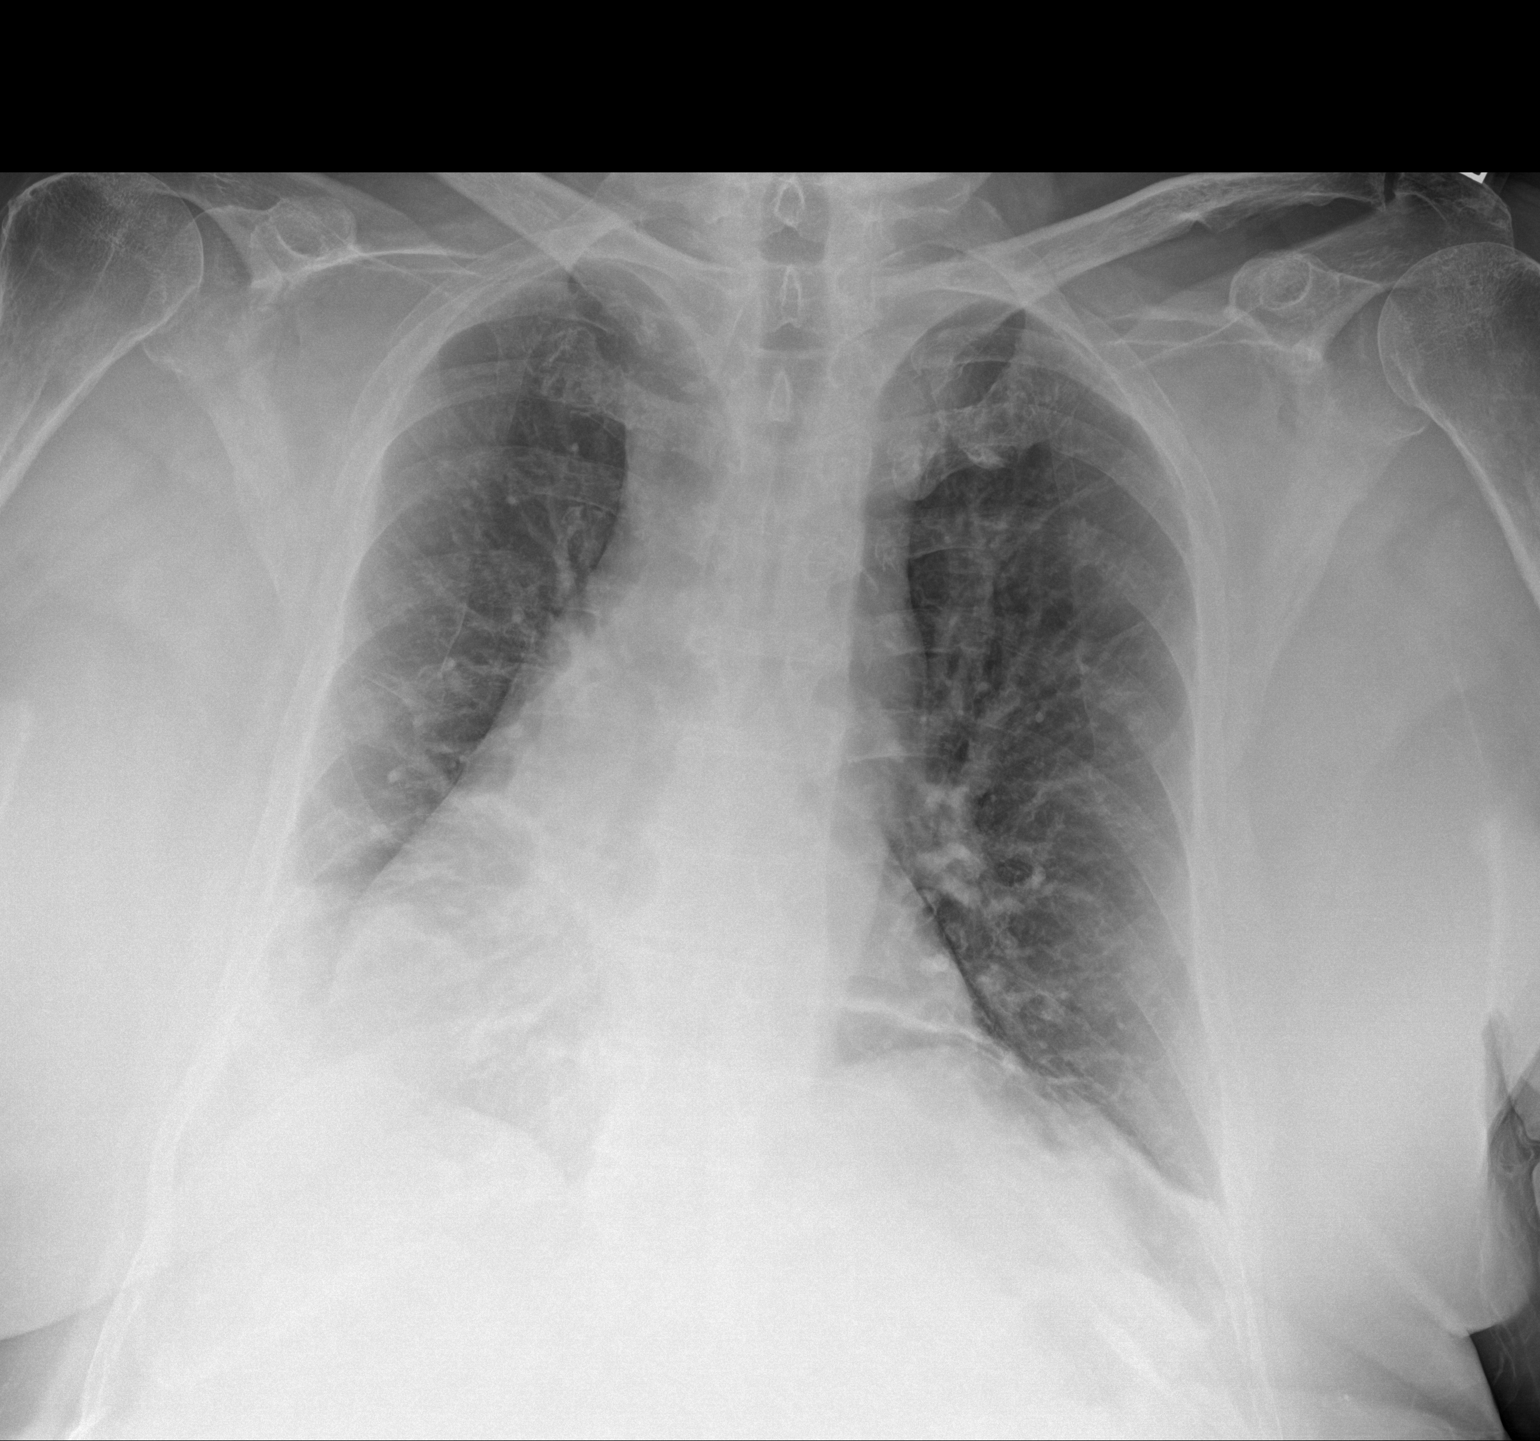

[1 of 1 positions shown; findings below may reference images not displayed]

FINDINGS: Transverse diameter of heart is increased. There is shift of
mediastinal structures to the right. There are linear densities in
both lower lung fields. There is interval improvement in the
aeration of both lower lung fields. There are no signs of alveolar
pulmonary edema or new focal pulmonary consolidation. Right lateral
costophrenic angle is indistinct. There is no pneumothorax.
IMPRESSION: Cardiomegaly. There are no signs of pulmonary edema. There is
interval improvement in aeration of both lower lung fields
suggesting decrease in subsegmental atelectasis. Increased density
in right lower lung field may be due to pleural effusion and
possibly underlying infiltrates. No new focal infiltrates are seen.

Reading location: [place], [place].

## 2023-08-21 IMAGING — DX DG CHEST 1V PORT
1 series · 1 of 1 positions shown · non-contrast
Comparison: December 09, 2020.

CLINICAL DATA: Shortness of breath.

EXAM:
PORTABLE CHEST 1 VIEW

[chest ap]
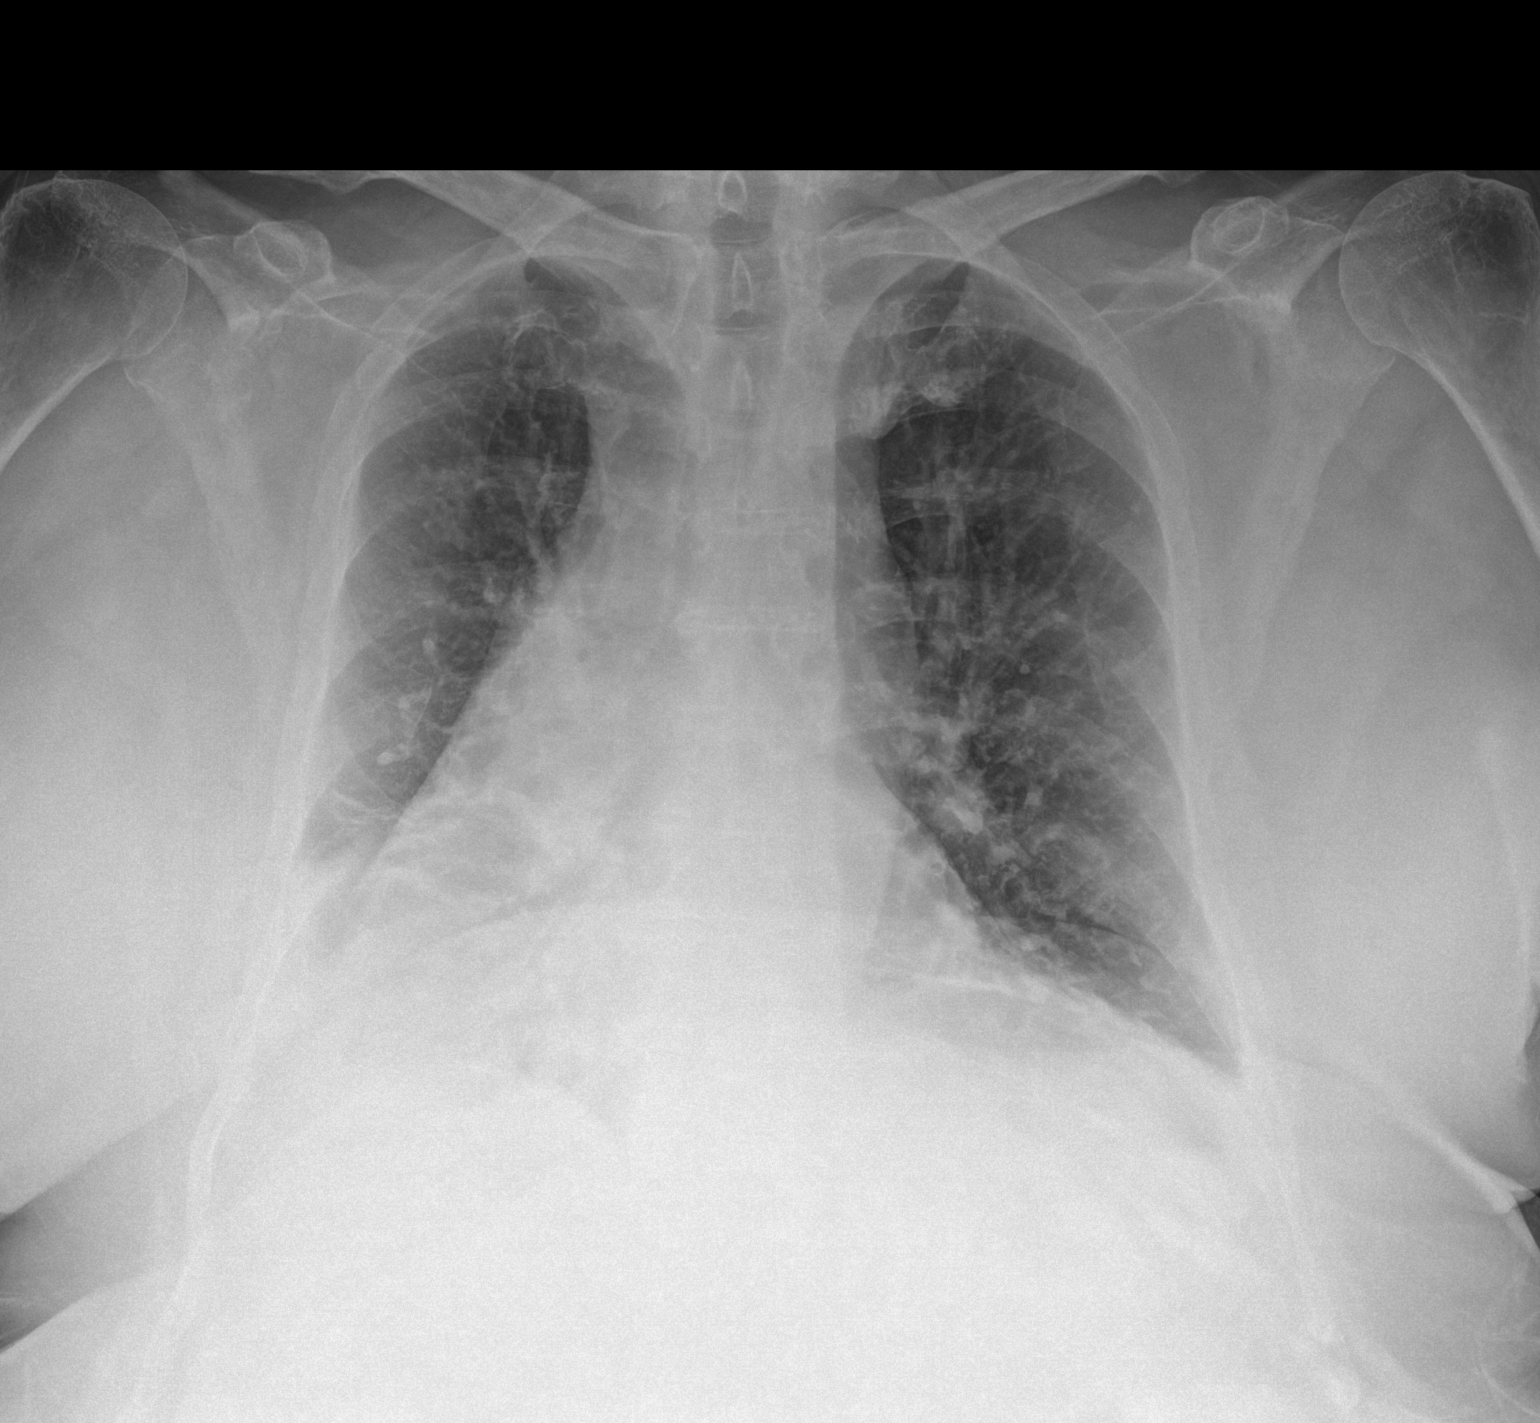

[1 of 1 positions shown; findings below may reference images not displayed]

FINDINGS: Stable cardiomegaly with possible central pulmonary vascular
congestion. No pneumothorax is noted. Left lung is clear. Mild right
basilar subsegmental atelectasis is noted with possible small
pleural effusion. Bony thorax is unremarkable.
IMPRESSION: Stable cardiomegaly with mild central pulmonary vascular congestion.
Mild right basilar subsegmental atelectasis is noted with possible
small right pleural effusion.

## 2023-08-22 DIAGNOSIS — J8410 Pulmonary fibrosis, unspecified: Secondary | ICD-10-CM | POA: Diagnosis not present

## 2023-08-22 DIAGNOSIS — J9611 Chronic respiratory failure with hypoxia: Secondary | ICD-10-CM | POA: Diagnosis not present

## 2023-08-27 NOTE — Telephone Encounter (Signed)
 LVM to schedule orthotic pick up  3RD AND LAST ATTEMPT

## 2023-08-30 ENCOUNTER — Encounter: Payer: Self-pay | Admitting: Emergency Medicine

## 2023-08-30 ENCOUNTER — Ambulatory Visit
Admission: EM | Admit: 2023-08-30 | Discharge: 2023-08-30 | Disposition: A | Discharge status: Home / Self Care | Attending: Emergency Medicine | Admitting: Emergency Medicine

## 2023-08-30 DIAGNOSIS — N3000 Acute cystitis without hematuria: Secondary | ICD-10-CM | POA: Insufficient documentation

## 2023-08-30 LAB — POCT URINALYSIS DIP (MANUAL ENTRY)
Glucose, UA: NEGATIVE mg/dL
Ketones, POC UA: NEGATIVE mg/dL
Nitrite, UA: POSITIVE — AB
Protein Ur, POC: >=300 mg/dL — AB
Spec Grav, UA: >=1.03 — AB (ref 1.010–1.025)
Urobilinogen, UA: 0.2 U/dL
pH, UA: 5.5 (ref 5.0–8.0)

## 2023-08-30 MED ORDER — NITROFURANTOIN MONOHYD MACRO 100 MG PO CAPS: 100.0000 mg | ORAL_CAPSULE | Freq: Two times a day (BID) | ORAL | 0 refills | Status: DC

## 2023-08-30 NOTE — ED Provider Notes (Signed)
 Kirsten [name]    CSN: [id number] Arrival date & time: 08/30/23  0808      History   Chief Complaint Chief Complaint  Patient presents with   Urinary Frequency   Dysuria    HPI Kirsten [name] is a 68 y.o. female.   Patient is in for evaluation of urinary frequency, urgency, dysuria beginning 2 days ago.  Has attempted use of Tylenol .  Denies abdominal, back pain, vaginal symptoms or fever.  Past Medical History:  Diagnosis Date   A-fib (HCC)    Anxiety disorder    Arthritis    Asthma    Gastroschisis    umphalocele, rotated organs abd repair until age 43   GERD (gastroesophageal reflux disease)    HTN (hypertension)    Hyperlipidemia    Hypothyroidism    IBS (irritable bowel syndrome)    Morbid obesity (HCC)    Target wt - 185  for BMI < 30   Obesity    OSA on CPAP    uses with 2L O2   Pneumonia    PONV (postoperative nausea and vomiting)    SBO (small bowel obstruction) (HCC)    Resolved with NG/Bowel rest around 2009   Sleep apnea    Type II or unspecified type diabetes mellitus without mention of complication, not stated as uncontrolled     Patient Active Problem List   Diagnosis Date Noted   Nausea 07/25/2023   Decreased appetite 07/25/2023   Decreased urine output 07/25/2023   Acute right-sided thoracic back pain 07/25/2023   Type 2 diabetes mellitus with hyperglycemia, without long-term current use of insulin  (HCC) 07/25/2023   Oropharyngeal dysphagia 07/18/2023   Recurrent UTI (urinary tract infection) 08/01/2022   Flu vaccine need 11/02/2021   Diuretic-induced hypokalemia 11/02/2021   Iron deficiency anemia 11/01/2021   Vitamin B12 deficiency anemia due to intrinsic factor deficiency 11/01/2021   Chronic respiratory failure with hypoxia (HCC) 10/17/2021   Encounter for general adult medical examination with abnormal findings 12/20/2020   Upper airway cough syndrome 10/28/2020   Spinal stenosis, lumbar region with neurogenic claudication  12/31/2019   DDD (degenerative disc disease), lumbar 08/11/2019   Primary osteoarthritis of right hip 08/11/2019   NASH (nonalcoholic steatohepatitis) 06/02/2019   Colon cancer screening 12/25/2017   Estrogen deficiency 12/25/2017   Pulmonary fibrosis (HCC)    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 03/21/2017   Insomnia w/ sleep apnea 03/21/2017   Class 1 obesity with serious comorbidity and body mass index (BMI) of 34.0 to 34.9 in adult 01/31/2016   Insomnia due to anxiety and fear 10/11/2014   GAD (generalized anxiety disorder) 10/11/2014   Paroxysmal atrial fibrillation (HCC) 10/04/2014   Hyperlipidemia with target LDL less than 100 10/22/2012   Asthma, mild persistent 04/06/2011   Hypothyroidism 06/20/2009   Depression with anxiety 06/20/2009   Type II diabetes mellitus with manifestations (HCC) 04/08/2009   Essential hypertension, benign 04/08/2009   OSA on CPAP 04/08/2009    Past Surgical History:  Procedure Laterality Date   BREAST EXCISIONAL BIOPSY Left 03/19/2012   neg   CESAREAN SECTION     CHOLECYSTECTOMY  1992   COLONOSCOPY  2011   2011-normal   Newborn Surgery - GI - ORGANS OUTSIDE ABDOMEN     RIGHT/LEFT HEART CATH AND CORONARY ANGIOGRAPHY N/A 10/02/2021   Procedure: RIGHT/LEFT HEART CATH AND CORONARY ANGIOGRAPHY;  Surgeon: Ammon Blunt, MD;  Location: [facility] INVASIVE CV LAB;  Service: Cardiovascular;  Laterality: N/A;   Small  Bowel Repair     TUBAL LIGATION  1988    OB History     Gravida  3   Para  3   Term  3   Preterm      AB      Living  3      SAB      IAB      Ectopic      Multiple      Live Births  3            Home Medications    Prior to Admission medications   Medication Sig Start Date End Date Taking? Authorizing Provider  nitrofurantoin , macrocrystal-monohydrate, (MACROBID ) 100 MG capsule Take 1 capsule (100 mg total) by mouth 2 (two) times daily. 08/30/23  Yes Linlee Cromie, Shelba SAUNDERS, NP  albuterol  (PROVENTIL ) (2.5  MG/3ML) 0.083% nebulizer solution Take 3 mLs (2.5 mg total) by nebulization every 4 (four) hours as needed for wheezing or shortness of breath. 10/11/22 10/11/23  Isaiah Scrivener, MD  albuterol  (VENTOLIN  HFA) 108 (90 Base) MCG/ACT inhaler Inhale 2 puffs into the lungs every 6 (six) hours as needed for wheezing or shortness of breath. 04/19/22   Amin, Sumayya, MD  ALPRAZolam  (XANAX ) 0.25 MG tablet Take 1 tablet (0.25 mg total) by mouth 3 (three) times daily as needed for anxiety. 06/26/22   Joshua Debby CROME, MD  amiodarone  (PACERONE ) 200 MG tablet Take 1 tablet (200 mg total) by mouth daily. 11/13/22   Fernand Denyse LABOR, MD  atorvastatin  (LIPITOR) 40 MG tablet TAKE ONE TABLET BY MOUTH ONE TIME DAILY 06/10/23   Joshua Debby CROME, MD  azithromycin  (ZITHROMAX  Z-PAK) 250 MG tablet Take 2 tablets on Day 1 and then 1 tablet daily till gone. 08/01/23   Kasa, Kurian, MD  BREO ELLIPTA  100-25 MCG/ACT AEPB INHALE ONE PUFF BY MOUTH ONE TIME DAILY 07/29/23   Parrett, Madelin RAMAN, NP  Continuous Glucose Sensor (FREESTYLE LIBRE 3 SENSOR) MISC Place 1 Act onto the skin daily. 06/10/23   Joshua Debby CROME, MD  cyanocobalamin  (,VITAMIN B-12,) 1000 MCG/ML injection INJECT 1ML INTO THE MUSCLE ONCE A MONTH 08/21/21   Patel, Donika K, DO  ELIQUIS  5 MG TABS tablet TAKE ONE TABLET BY MOUTH TWICE A DAY 02/14/23   Joshua Debby CROME, MD  fluticasone  (FLONASE ) 50 MCG/ACT nasal spray Place 1 spray into both nostrils daily. 04/20/22   Amin, Sumayya, MD  furosemide  (LASIX ) 20 MG tablet Take 20 mg by mouth daily.    [provider]  Insulin  Pen Needle 32G X 6 MM MISC 1 Act by Does not apply route once a week. 03/07/23   Joshua Debby CROME, MD  levothyroxine  (SYNTHROID ) 175 MCG tablet TAKE ONE TABLET BY MOUTH ONE TIME DAILY 08/12/23   Joshua Debby CROME, MD  losartan  (COZAAR ) 25 MG tablet Take 25 mg by mouth daily. 06/29/23   [provider]  metFORMIN  (GLUCOPHAGE -XR) 750 MG 24 hr tablet Take 1 tablet (750 mg total) by mouth daily with breakfast. 06/10/23    Joshua Debby CROME, MD  metoprolol  succinate (TOPROL -XL) 25 MG 24 hr tablet Take 0.5 tablets (12.5 mg total) by mouth daily. Patient taking differently: Take 25 mg by mouth daily. 10/07/21 07/25/23  Trudy Anthony HERO, MD  ondansetron  (ZOFRAN -ODT) 4 MG disintegrating tablet Take 1 tablet (4 mg total) by mouth every 8 (eight) hours as needed for nausea or vomiting. 07/25/23   Willo Dunnings, MD  OXYGEN  Inhale 2 L into the lungs at bedtime. Uses with CPAP  [provider]  PARoxetine  (PAXIL -CR) 25 MG 24 hr tablet TAKE ONE TABLET BY MOUTH ONE TIME DAILY 07/17/23   Joshua Debby CROME, MD  predniSONE  (DELTASONE ) 20 MG tablet Take 1 tablet (20 mg total) by mouth daily with breakfast. 7 days 08/01/23   Isaiah Scrivener, MD  predniSONE  (DELTASONE ) 20 MG tablet Take 1 tablet (20 mg total) by mouth daily with breakfast. 10 days 08/07/23   Isaiah Scrivener, MD  Semaglutide  (RYBELSUS ) 3 MG TABS Take 1 tablet (3 mg total) by mouth daily. Patient not taking: Reported on 08/12/2023 08/07/23   Joshua Debby CROME, MD  torsemide (DEMADEX) 20 MG tablet Take 20 mg by mouth as needed (Edema in legs). 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   Vaping status: Never Used  Substance Use Topics   Alcohol use: Never    Alcohol/week: 2.0 standard drinks of alcohol    Types: 2 Glasses of wine per week   Drug use: No     Allergies   Doxycycline , Factive [gemifloxacin mesylate], Sulfonamide derivatives, Crestor  [rosuvastatin ],  Gemifloxacin, and Penicillins   Review of Systems Review of Systems   Physical Exam Triage Vital Signs ED Triage Vitals  Encounter Vitals Group     BP 08/30/23 0820 131/82     Girls Systolic BP Percentile --      Girls Diastolic BP Percentile --      Boys Systolic BP Percentile --      Boys Diastolic BP Percentile --      Pulse Rate 08/30/23 0820 73     Resp 08/30/23 0820 18     Temp 08/30/23 0820 98.2 F (36.8 C)     Temp Source 08/30/23 0820 Oral     SpO2 08/30/23 0820 95 %     Weight --      Height --      Head Circumference --      Peak Flow --      Pain Score 08/30/23 0822 0     Pain Loc --      Pain Education --      Exclude from Growth Chart --    No data found.  Updated Vital Signs BP 131/82 (BP Location: Left Arm)   Pulse 73   Temp 98.2 F (36.8 C) (Oral)   Resp 18   SpO2 95%   Visual Acuity Right Eye Distance:   Left Eye Distance:   Bilateral Distance:    Right Eye Near:   Left Eye Near:    Bilateral Near:     Physical Exam Constitutional:      Appearance: Normal appearance.  Eyes:     Extraocular Movements: Extraocular movements intact.  Pulmonary:     Effort: Pulmonary effort is normal.  Abdominal:     Tenderness: There is no abdominal tenderness.  There is no right CVA tenderness, left CVA tenderness or guarding.  Neurological:     Mental Status: She is alert and oriented to person, place, and time. Mental status is at baseline.      UC Treatments / Results  Labs (all labs ordered are listed, but only abnormal results are displayed) Labs Reviewed  POCT URINALYSIS DIP (MANUAL ENTRY) - Abnormal; Notable for the following components:      Result Value   Clarity, UA cloudy (*)    Bilirubin, UA small (*)    Spec Grav, UA >=1.030 (*)    Blood, UA large (*)    Protein Ur, POC >=300 (*)    Nitrite, UA Positive (*)    Leukocytes, UA Small (1+) (*)    All other components within normal limits  URINE CULTURE    EKG   Radiology No  results found.  Procedures Procedures (including critical care time)  Medications Ordered in UC Medications - No data to display  Initial Impression / Assessment and Plan / UC Course  I have reviewed the triage vital signs and the nursing notes.  Pertinent labs & imaging results that were available during my care of the patient were reviewed by me and considered in my medical decision making (see chart for details).  Acute cystitis without hematuria  Urinalysis showing leukocytes and nitrates, sent for culture, prescribed Macrobid , per patient has had success with this in the past, recommended over-the-counter medications and nonpharmacological supportive care advised follow-up as needed Final Clinical Impressions(s) / UC Diagnoses   Final diagnoses:  Acute cystitis without hematuria     Discharge Instructions      Your urinalysis shows Advik Weatherspoon blood cells and nitrates which are indicative of infection, your urine will be sent to the lab to determine exactly which bacteria is present, if any changes need to be made to your medications you will be notified  Begin use of Macrobid  twice daily for 5 days  You may use over-the-counter Azo to help minimize your symptoms until antibiotic removes bacteria, this medication will turn your urine orange  Increase your fluid intake through use of water  As always practice good hygiene, wiping front to back and avoidance of scented vaginal products to prevent further irritation  If symptoms continue to persist after use of medication or recur please follow-up with urgent care or your primary doctor as needed    ED Prescriptions     Medication Sig Dispense Auth. Provider   nitrofurantoin , macrocrystal-monohydrate, (MACROBID ) 100 MG capsule Take 1 capsule (100 mg total) by mouth 2 (two) times daily. 10 capsule Haani Bakula R, NP      PDMP not reviewed this encounter.   Kirsten [name], [place] 08/30/23 [phone]

## 2023-08-30 NOTE — Discharge Instructions (Addendum)
Your urinalysis shows Kirsten [name] blood cells and nitrates which are indicative of infection, your urine will be sent to the lab to determine exactly which bacteria is present, if any changes need to be made to your medications you will be notified  Begin use of Macrobid twice daily for 5 days   You may use over-the-counter Azo to help minimize your symptoms until antibiotic removes bacteria, this medication will turn your urine orange  Increase your fluid intake through use of water  As always practice good hygiene, wiping front to back and avoidance of scented vaginal products to prevent further irritation  If symptoms continue to persist after use of medication or recur please follow-up with urgent care or your primary doctor as needed  

## 2023-08-30 NOTE — ED Triage Notes (Signed)
 Patient complains of urinary frequency, burning x 2 days. Patient took Tylenol  at last night at 9 pm.

## 2023-09-01 ENCOUNTER — Telehealth: Payer: Self-pay | Admitting: Emergency Medicine

## 2023-09-01 ENCOUNTER — Encounter: Payer: Self-pay | Admitting: Internal Medicine

## 2023-09-01 LAB — URINE CULTURE: Culture: >=100000 — AB

## 2023-09-01 NOTE — Telephone Encounter (Signed)
 Notifying clinic that urine culture has resulted, wanting to see if antibiotic compatible, Klebsiella pneumoniae noted, prescribed Macrobid , advised to continue

## 2023-09-02 ENCOUNTER — Ambulatory Visit (HOSPITAL_COMMUNITY): Payer: Self-pay

## 2023-09-04 ENCOUNTER — Ambulatory Visit

## 2023-09-05 ENCOUNTER — Encounter

## 2023-09-06 DIAGNOSIS — J9611 Chronic respiratory failure with hypoxia: Secondary | ICD-10-CM | POA: Diagnosis not present

## 2023-09-06 DIAGNOSIS — J8410 Pulmonary fibrosis, unspecified: Secondary | ICD-10-CM | POA: Diagnosis not present

## 2023-09-12 ENCOUNTER — Telehealth: Payer: Self-pay

## 2023-09-12 ENCOUNTER — Ambulatory Visit
Admission: RE | Admit: 2023-09-12 | Discharge: 2023-09-12 | Disposition: A | Discharge status: Home / Self Care | Source: Ambulatory Visit | Attending: Internal Medicine | Admitting: Internal Medicine

## 2023-09-12 ENCOUNTER — Other Ambulatory Visit: Payer: Self-pay | Admitting: Internal Medicine

## 2023-09-12 DIAGNOSIS — Z1231 Encounter for screening mammogram for malignant neoplasm of breast: Secondary | ICD-10-CM | POA: Insufficient documentation

## 2023-09-12 DIAGNOSIS — N6322 Unspecified lump in the left breast, upper inner quadrant: Secondary | ICD-10-CM

## 2023-09-12 DIAGNOSIS — R928 Other abnormal and inconclusive findings on diagnostic imaging of breast: Secondary | ICD-10-CM

## 2023-09-12 NOTE — Telephone Encounter (Signed)
 Copied from CRM [id number]. Topic: General - Other >> Sep 12, 2023  4:19 PM Rosina BIRCH wrote: Reason for CRM: patient called stating [facility] would not do a mammography because she has a lump on the left side right above her breast. [facility] will not do a regular screening because of the lump and they are sending over a new order request to the doctor CB [phone]

## 2023-09-13 NOTE — Telephone Encounter (Signed)
 New order has been signed and the patient has been scheduled.

## 2023-09-16 ENCOUNTER — Ambulatory Visit (INDEPENDENT_AMBULATORY_CARE_PROVIDER_SITE_OTHER): Payer: PPO

## 2023-09-16 VITALS — Ht 66.0 in | Wt 200.0 lb

## 2023-09-16 DIAGNOSIS — Z0000 Encounter for general adult medical examination without abnormal findings: Secondary | ICD-10-CM | POA: Diagnosis not present

## 2023-09-16 NOTE — Patient Instructions (Signed)
 Kirsten [name] , Thank you for taking time out of your busy schedule to complete your Annual Wellness Visit with me. I enjoyed our conversation and look forward to speaking with you again next year. I, as well as your care team,  appreciate your ongoing commitment to your health goals. Please review the following plan we discussed and let me know if I can assist you in the future. Your Game plan/ To Do List     Follow up Visits: We will see or speak with you next year for your Next Medicare AWV with our clinical staff Have you seen your provider in the last 6 months (3 months if uncontrolled diabetes)? Yes  Clinician Recommendations:  Aim for 30 minutes of exercise or brisk walking, 6-8 glasses of water, and 5 servings of fruits and vegetables each day. Keep up the good work.      This is a list of the screenings recommended for you:  Health Maintenance  Topic Date Due   Zoster (Shingles) Vaccine (1 of 2) Never done   Eye exam for diabetics  07/25/2023   Hemoglobin A1C  12/10/2023   Complete foot exam   03/05/2024   Yearly kidney health urinalysis for diabetes  06/09/2024   Yearly kidney function blood test for diabetes  07/24/2024   Mammogram  09/02/2024   Medicare Annual Wellness Visit  09/15/2024   Colon Cancer Screening  11/25/2026   DTaP/Tdap/Td vaccine (3 - Td or Tdap) 11/02/2031   Pneumococcal Vaccine for age over 29  Completed   DEXA scan (bone density measurement)  Completed   Hepatitis C Screening  Completed   Hepatitis B Vaccine  Aged Out   HPV Vaccine  Aged Out   Meningitis B Vaccine  Aged Out   Flu Shot  Discontinued   COVID-19 Vaccine  Discontinued    Advanced directives: (Copy Requested) Please bring a copy of your health care power of attorney and living will to the office to be added to your chart at your convenience. You can mail to [facility] [address] [place], [place] [address] or email to [email or web address] Advance Care Planning is  important because it:  [x]  Makes sure you receive the medical care that is consistent with your values, goals, and preferences  [x]  It provides guidance to your family and loved ones and reduces their decisional burden about whether or not they are making the right decisions based on your wishes.  Follow the link provided in your after visit summary or read over the paperwork we have mailed to you to help you started getting your Advance Directives in place. If you need assistance in completing these, please reach out to us  so that we can help you!  See attachments for Preventive Care and Fall Prevention Tips.

## 2023-09-16 NOTE — Progress Notes (Signed)
 Subjective:   Kirsten [name] is a 68 y.o. who presents for a Medicare Wellness preventive visit.  As a reminder, Annual Wellness Visits don't include a physical exam, and some assessments may be limited, especially if this visit is performed virtually. We may recommend an in-person follow-up visit with your provider if needed.  Visit Complete: Virtual I connected with  Kirsten [name] on 09/16/23 by a audio enabled telemedicine application and verified that I am speaking with the correct person using two identifiers.  Patient Location: Home  Provider Location: Home Office  I discussed the limitations of evaluation and management by telemedicine. The patient expressed understanding and agreed to proceed.  Vital Signs: Because this visit was a virtual/telehealth visit, some criteria may be missing or patient reported. Any vitals not documented were not able to be obtained and vitals that have been documented are patient reported.  VideoDeclined- This patient declined Librarian, academic. Therefore the visit was completed with audio only.  Persons Participating in Visit: Patient.  AWV Questionnaire: No: Patient Medicare AWV questionnaire was not completed prior to this visit.  Cardiac Risk Factors include: advanced age (>36men, >1 women);diabetes mellitus;hypertension;Other (see comment);dyslipidemia;obesity (BMI >30kg/m2), Risk factor comments: A-fib, NASH, OSA     Objective:    Today's Vitals   09/16/23 1107  Weight: 200 lb (90.7 kg)  Height: 5' 6 (1.676 m)   Body mass index is 32.28 kg/m.     09/16/2023   11:17 AM 07/25/2023    3:49 PM 10/02/2022    1:00 PM 10/02/2022    9:57 AM 09/03/2022    1:12 PM 04/17/2022    5:06 AM 09/29/2021   10:11 AM  Advanced Directives  Does Patient Have a Medical Advance Directive? Yes No Yes Yes No Yes No  Type of Estate agent of [place];Living will  Healthcare Power of [place];Living will  Healthcare Power of [place];Living will  Healthcare Power of Attorney   Does patient want to make changes to medical advance directive?   No - Patient declined   No - Patient declined   Copy of Healthcare Power of Attorney in Chart? No - copy requested  No - copy requested   No - copy requested   Would patient like information on creating a medical advance directive?  No - Patient declined   No - Patient declined  No - Patient declined    Current Medications (verified) Outpatient Encounter Medications as of 09/16/2023  Medication Sig   albuterol  (PROVENTIL ) (2.5 MG/3ML) 0.083% nebulizer solution Take 3 mLs (2.5 mg total) by nebulization every 4 (four) hours as needed for wheezing or shortness of breath.   albuterol  (VENTOLIN  HFA) 108 (90 Base) MCG/ACT inhaler Inhale 2 puffs into the lungs every 6 (six) hours as needed for wheezing or shortness of breath.   ALPRAZolam  (XANAX ) 0.25 MG tablet Take 1 tablet (0.25 mg total) by mouth 3 (three) times daily as needed for anxiety.   amiodarone  (PACERONE ) 200 MG tablet Take 1 tablet (200 mg total) by mouth daily.   atorvastatin  (LIPITOR) 40 MG tablet TAKE ONE TABLET BY MOUTH ONE TIME DAILY   azithromycin  (ZITHROMAX  Z-PAK) 250 MG tablet Take 2 tablets on Day 1 and then 1 tablet daily till gone.   BREO ELLIPTA  100-25 MCG/ACT AEPB INHALE ONE PUFF BY MOUTH ONE TIME DAILY   Continuous Glucose Sensor (FREESTYLE LIBRE 3 SENSOR) MISC Place 1 Act onto the skin daily.   cyanocobalamin  (,VITAMIN B-12,) 1000 MCG/ML injection  INJECT INTO THE MUSCLE ONCE A MONTH   ELIQUIS  5 MG TABS tablet TAKE ONE TABLET BY MOUTH TWICE A DAY   fluticasone  (FLONASE ) 50 MCG/ACT nasal spray Place 1 spray into both nostrils daily.   furosemide  (LASIX ) 20 MG tablet Take 20 mg by mouth daily.   Insulin  Pen Needle 32G X 6 MM MISC 1 Act by Does not apply route once a week.   levothyroxine  (SYNTHROID ) 175 MCG tablet TAKE ONE TABLET BY MOUTH ONE TIME DAILY   losartan  (COZAAR ) 25 MG tablet  Take 25 mg by mouth daily.   metFORMIN  (GLUCOPHAGE -XR) 750 MG 24 hr tablet Take 1 tablet (750 mg total) by mouth daily with breakfast.   metoprolol  succinate (TOPROL -XL) 25 MG 24 hr tablet Take 0.5 tablets (12.5 mg total) by mouth daily. (Patient taking differently: Take 25 mg by mouth daily.)   nitrofurantoin , macrocrystal-monohydrate, (MACROBID ) 100 MG capsule Take 1 capsule (100 mg total) by mouth 2 (two) times daily.   ondansetron  (ZOFRAN -ODT) 4 MG disintegrating tablet Take 1 tablet (4 mg total) by mouth every 8 (eight) hours as needed for nausea or vomiting.   OXYGEN  Inhale 2 L into the lungs at bedtime. Uses with CPAP   PARoxetine  (PAXIL -CR) 25 MG 24 hr tablet TAKE ONE TABLET BY MOUTH ONE TIME DAILY   predniSONE  (DELTASONE ) 20 MG tablet Take 1 tablet (20 mg total) by mouth daily with breakfast. 7 days   predniSONE  (DELTASONE ) 20 MG tablet Take 1 tablet (20 mg total) by mouth daily with breakfast. 10 days   Semaglutide  (RYBELSUS ) 3 MG TABS Take 1 tablet (3 mg total) by mouth daily.   torsemide (DEMADEX) 20 MG tablet Take 20 mg by mouth as needed (Edema in legs).   No facility-administered encounter medications on file as of 09/16/2023.    Allergies (verified) Doxycycline , Factive [gemifloxacin mesylate], Sulfonamide derivatives, Crestor  [rosuvastatin ], Gemifloxacin, and Penicillins   History: Past Medical History:  Diagnosis Date   A-fib (HCC)    Anxiety disorder    Arthritis    Asthma    Gastroschisis    umphalocele, rotated organs abd repair until age 8   GERD (gastroesophageal reflux disease)    HTN (hypertension)    Hyperlipidemia    Hypothyroidism    IBS (irritable bowel syndrome)    Morbid obesity (HCC)    Target wt - 185  for BMI < 30   Obesity    OSA on CPAP    uses with 2L O2   Pneumonia    PONV (postoperative nausea and vomiting)    SBO (small bowel obstruction) (HCC)    Resolved with NG/Bowel rest around 2009   Sleep apnea    Type II or unspecified type  diabetes mellitus without mention of complication, not stated as uncontrolled    Past Surgical History:  Procedure Laterality Date   BREAST EXCISIONAL BIOPSY Left 03/19/2012   neg   CESAREAN SECTION     CHOLECYSTECTOMY  1992   COLONOSCOPY  2011   2011-normal   Newborn Surgery - GI - ORGANS OUTSIDE ABDOMEN     RIGHT/LEFT HEART CATH AND CORONARY ANGIOGRAPHY N/A 10/02/2021   Procedure: RIGHT/LEFT HEART CATH AND CORONARY ANGIOGRAPHY;  Surgeon: Ammon Blunt, MD;  Location: [facility] INVASIVE CV LAB;  Service: Cardiovascular;  Laterality: N/A;   Small Bowel Repair     TUBAL LIGATION  1988   Family History  Problem Relation Age of Onset   Lymphoma Mother    Diabetes type II Father  Colon cancer Father    Stomach cancer Father    Diabetes Maternal Grandmother    Diabetes type I Son    Goiter Neg Hx    Breast cancer Neg Hx    Ovarian cancer Neg Hx    Esophageal cancer Neg Hx    Pancreatic cancer Neg Hx    Social History   Socioeconomic History   Marital status: Married    Spouse name: Lynwood   Number of children: 3   Years of education: Not on file   Highest education level: Not on file  Occupational History   Occupation: Retired    Comment: Scientist, physiological at [employer]  Tobacco Use   Smoking status: Never   Smokeless tobacco: Never   Tobacco comments:    tried as a teen  Advertising account planner   Vaping status: Never Used  Substance and Sexual Activity   Alcohol use: Never    Alcohol/week: 2.0 standard drinks of alcohol    Types: 2 Glasses of wine per week   Drug use: No   Sexual activity: Yes    Birth control/protection: Surgical  Other Topics Concern   Not on file  Social History Narrative   Regular Exercise -  YES   Daily Caffeine  Use:  2   Active and independent at baseline   Right handed   Lives in a one story home with spouse/2025      Social Drivers of Health   Financial Resource Strain: Low Risk  (09/16/2023)   Overall Financial Resource Strain (CARDIA)     Difficulty of Paying Living Expenses: Not very hard  Food Insecurity: No Food Insecurity (09/16/2023)   Hunger Vital Sign    Worried About Running Out of Food in the Last Year: Never true    Ran Out of Food in the Last Year: Never true  Transportation Needs: No Transportation Needs (09/16/2023)   PRAPARE - Administrator, Civil Service (Medical): No    Lack of Transportation (Non-Medical): No  Physical Activity: Insufficiently Active (09/16/2023)   Exercise Vital Sign    Days of Exercise per Week: 7 days    Minutes of Exercise per Session: 20 min  Stress: No Stress Concern Present (09/16/2023)   [employer] of Occupational Health - Occupational Stress Questionnaire    Feeling of Stress: Not at all  Social Connections: Moderately Isolated (09/16/2023)   Social Connection and Isolation Panel    Frequency of Communication with Friends and Family: More than three times a week    Frequency of Social Gatherings with Friends and Family: Twice a week    Attends Religious Services: Never    Database administrator or Organizations: No    Attends Engineer, structural: Never    Marital Status: Married    Tobacco Counseling Counseling given: Not Answered Tobacco comments: tried as a teen    Clinical Intake:  Pre-visit preparation completed: Yes  Pain : No/denies pain     BMI - recorded: 32.28 Nutritional Status: BMI > 30  Obese Nutritional Risks: Nausea/ vomitting/ diarrhea Diabetes: Yes CBG done?: Yes (110-fasting per pt device) CBG resulted in Enter/ Edit results?: No Did pt. bring in CBG monitor from home?: Yes Glucose Meter Downloaded?: No  Lab Results  Component Value Date   HGBA1C 7.1 (H) 06/10/2023   HGBA1C 7.4 (H) 03/07/2023   HGBA1C 7.2 (H) 09/04/2022     How often do you need to have someone help you when you read instructions,  pamphlets, or other written materials from your doctor or pharmacy?: 1 - Never  Interpreter Needed?:  No  Information entered by :: Silvestre [place], RMA   Activities of Daily Living     09/16/2023   11:09 AM 10/02/2022    1:00 PM  In your present state of health, do you have any difficulty performing the following activities:  Hearing? 0 0  Vision? 0 0  Difficulty concentrating or making decisions? 0 0  Walking or climbing stairs? 0 0  Dressing or bathing? 0 0  Doing errands, shopping? 0 0  Comment her husband drives sometimes   Preparing Food and eating ? N   Using the Toilet? N   In the past six months, have you accidently leaked urine? Y   Do you have problems with loss of bowel control? N   Managing your Medications? N   Managing your Finances? N   Housekeeping or managing your Housekeeping? N     Patient Care Team: Joshua Debby CROME, MD as PCP - General Tobie Donika K, DO as Consulting Physician (Neurology)  I have updated your Care Teams any recent Medical Services you may have received from other providers in the past year.     Assessment:   This is a routine wellness examination for Kirsten [name].  Hearing/Vision screen Hearing Screening - Comments:: Denies hearing difficulties   Vision Screening - Comments:: Wears eyeglasses/ [facility]   Goals Addressed   None    Depression Screen     09/16/2023   11:19 AM 06/10/2023   10:10 AM 09/03/2022    1:18 PM 02/27/2022    3:12 PM 11/01/2021    2:29 PM 07/20/2021    8:55 AM 03/22/2021   10:12 AM  PHQ 2/9 Scores  PHQ - 2 Score 0 0 0 0 0 0 0  PHQ- 9 Score 2 3 0 0 0 2 0    Fall Risk     09/16/2023   11:17 AM 06/10/2023   10:10 AM 09/03/2022    1:12 PM 05/15/2021    9:43 AM 03/22/2021   10:12 AM  Fall Risk   Falls in the past year? 0 0 1 1 1   Comment   per patient lt leg gave out/  Tripped over some grass    Number falls in past yr: 0 0 1 1 0  Injury with Fall? 0 0 1 1 1   Risk for fall due to :  No Fall Risks No Fall Risks;Medication side effect  History of fall(s)  Follow up Falls evaluation completed;Falls prevention  discussed Falls evaluation completed Falls prevention discussed  Falls evaluation completed      Data saved with a previous flowsheet row definition    MEDICARE RISK AT HOME:  Medicare Risk at Home Any stairs in or around the home?: Yes (coming in home) If so, are there any without handrails?: No Home free of loose throw rugs in walkways, pet beds, electrical cords, etc?: Yes Adequate lighting in your home to reduce risk of falls?: Yes Life alert?: No Use of a cane, walker or w/c?: No Grab bars in the bathroom?: Yes Shower chair or bench in shower?: Yes Elevated toilet seat or a handicapped toilet?: Yes  TIMED UP AND GO:  Was the test performed?  No  Cognitive Function: Declined/Normal: No cognitive concerns noted by patient or family. Patient alert, oriented, able to answer questions appropriately and recall recent events. No signs of memory loss or confusion.  09/03/2022    1:15 PM  6CIT Screen  What Year? 0 points  What month? 0 points  What time? 0 points  Count back from 20 0 points  Months in reverse 0 points  Repeat phrase 0 points  Total Score 0 points    Immunizations Immunization History  Administered Date(s) Administered   Influenza Split 11/29/2010, 11/23/2011   Influenza Whole 11/12/2009   Influenza,inj,Quad PF,6+ Mos 10/22/2012, 12/31/2013, 11/02/2014, 11/28/2015, 11/23/2016, 12/25/2017, 11/03/2018, 11/16/2019, 12/20/2020, 11/01/2021   PFIZER(Purple Top)SARS-COV-2 Vaccination 04/25/2019, 05/20/2019, 02/08/2020   PNEUMOCOCCAL CONJUGATE-20 12/20/2020   Pneumococcal Polysaccharide-23 11/23/2011, 11/16/2019   Td 11/01/2021   Tdap 08/02/2010    Screening Tests Health Maintenance  Topic Date Due   Zoster Vaccines- Shingrix  (1 of 2) Never done   OPHTHALMOLOGY EXAM  07/25/2023   HEMOGLOBIN A1C  12/10/2023   FOOT EXAM  03/05/2024   Diabetic kidney evaluation - Urine ACR  06/09/2024   Diabetic kidney evaluation - eGFR measurement  07/24/2024    MAMMOGRAM  09/02/2024   Medicare Annual Wellness (AWV)  09/15/2024   Colonoscopy  11/25/2026   DTaP/Tdap/Td (3 - Td or Tdap) 11/02/2031   Pneumococcal Vaccine: 50+ Years  Completed   DEXA SCAN  Completed   Hepatitis C Screening  Completed   Hepatitis B Vaccines  Aged Out   HPV VACCINES  Aged Out   Meningococcal B Vaccine  Aged Out   INFLUENZA VACCINE  Discontinued   COVID-19 Vaccine  Discontinued    Health Maintenance  Health Maintenance Due  Topic Date Due   Zoster Vaccines- Shingrix  (1 of 2) Never done   OPHTHALMOLOGY EXAM  07/25/2023   Health Maintenance Items Addressed: See Nurse Notes at the end of this note  Additional Screening:  Vision Screening: Recommended annual ophthalmology exams for early detection of glaucoma and other disorders of the eye. Would you like a referral to an eye doctor? No    Dental Screening: Recommended annual dental exams for proper oral hygiene  Community Resource Referral / Chronic Care Management: CRR required this visit?  No   CCM required this visit?  No   Plan:    I have personally reviewed and noted the following in the patient's chart:   Medical and social history Use of alcohol, tobacco or illicit drugs  Current medications and supplements including opioid prescriptions. Patient is not currently taking opioid prescriptions. Functional ability and status Nutritional status Physical activity Advanced directives List of other physicians Hospitalizations, surgeries, and ER visits in previous 12 months Vitals Screenings to include cognitive, depression, and falls Referrals and appointments  In addition, I have reviewed and discussed with patient certain preventive protocols, quality metrics, and best practice recommendations. A written personalized care plan for preventive services as well as general preventive health recommendations were provided to patient.   Channie Bostick L Zyquan Crotty, CMA   09/16/2023   After Visit Summary:  (MyChart) Due to this being a telephonic visit, the after visit summary with patients personalized plan was offered to patient via MyChart   Notes: Patient declines a shingrix  vaccine.  Patient stated that she has had a recent eye exam.  I have sent a request for records out today.  Patient had no other concerns to address today.

## 2023-09-17 ENCOUNTER — Ambulatory Visit
Admission: RE | Admit: 2023-09-17 | Discharge: 2023-09-17 | Disposition: A | Discharge status: Home / Self Care | Source: Ambulatory Visit | Attending: Internal Medicine | Admitting: Internal Medicine

## 2023-09-17 ENCOUNTER — Other Ambulatory Visit (HOSPITAL_COMMUNITY): Payer: Self-pay

## 2023-09-17 ENCOUNTER — Telehealth: Payer: Self-pay

## 2023-09-17 ENCOUNTER — Telehealth: Payer: Self-pay | Admitting: Internal Medicine

## 2023-09-17 DIAGNOSIS — R92313 Mammographic fatty tissue density, bilateral breasts: Secondary | ICD-10-CM | POA: Diagnosis not present

## 2023-09-17 DIAGNOSIS — E118 Type 2 diabetes mellitus with unspecified complications: Secondary | ICD-10-CM

## 2023-09-17 DIAGNOSIS — N6322 Unspecified lump in the left breast, upper inner quadrant: Secondary | ICD-10-CM

## 2023-09-17 NOTE — Telephone Encounter (Signed)
 Copied from CRM [id number]. Topic: Clinical - Medication Prior Auth >> Sep 17, 2023 10:43 AM Laymon HERO wrote: Reason for CRM: Patient needing a prior authorization for Semaglutide  (RYBELSUS ) 3 MG TABS [[id number]]

## 2023-09-17 NOTE — Telephone Encounter (Signed)
 Pharmacy Patient Advocate Encounter   Received notification from Pt Calls Messages that prior authorization for Rybelsus  3mg  tabs is required/requested.   Insurance verification completed.   The patient is insured through [facility] ADVANTAGE/RX ADVANCE .   Per test claim: PA required; PA submitted to above mentioned insurance via Phone Key/confirmation #/EOC -- Status is pending     Prior auth submitted for quantity exception.   Phone # [phone] Fax # [phone]

## 2023-09-17 NOTE — Telephone Encounter (Signed)
 Copied from CRM [id number]. Topic: Clinical - Medication Refill >> Sep 17, 2023 10:44 AM [place] wrote: Medication: Semaglutide  (RYBELSUS ) 3 MG TABS  Has the patient contacted their pharmacy? Yes (Agent: If no, request that the patient contact the pharmacy for the refill. If patient does not wish to contact the pharmacy document the reason why and proceed with request.) (Agent: If yes, when and what did the pharmacy advise?)  This is the patient's preferred pharmacy:  [facility] [address] [facility] - [place], [place] - [address] [facility] [facility] [address] [place] [address] Phone: [phone] Fax: [phone]  Is this the correct pharmacy for this prescription? Yes If no, delete pharmacy and type the correct one.   Has the prescription been filled recently? Yes  Is the patient out of the medication? Yes  Has the patient been seen for an appointment in the last year OR does the patient have an upcoming appointment? Yes  Can we respond through MyChart? Yes  Agent: Please be advised that Rx refills may take up to 3 business days. We ask that you follow-up with your pharmacy.

## 2023-09-18 ENCOUNTER — Other Ambulatory Visit (HOSPITAL_COMMUNITY): Payer: Self-pay

## 2023-09-18 IMAGING — DX DG CHEST 2V
2 series · 2 of 2 positions shown · non-contrast
Comparison: 12/20/2020, 02/12/2020

CLINICAL DATA: Recurrent cough, recent pneumonia, atrial
fibrillation

EXAM:
CHEST - 2 VIEW

[chest pa]
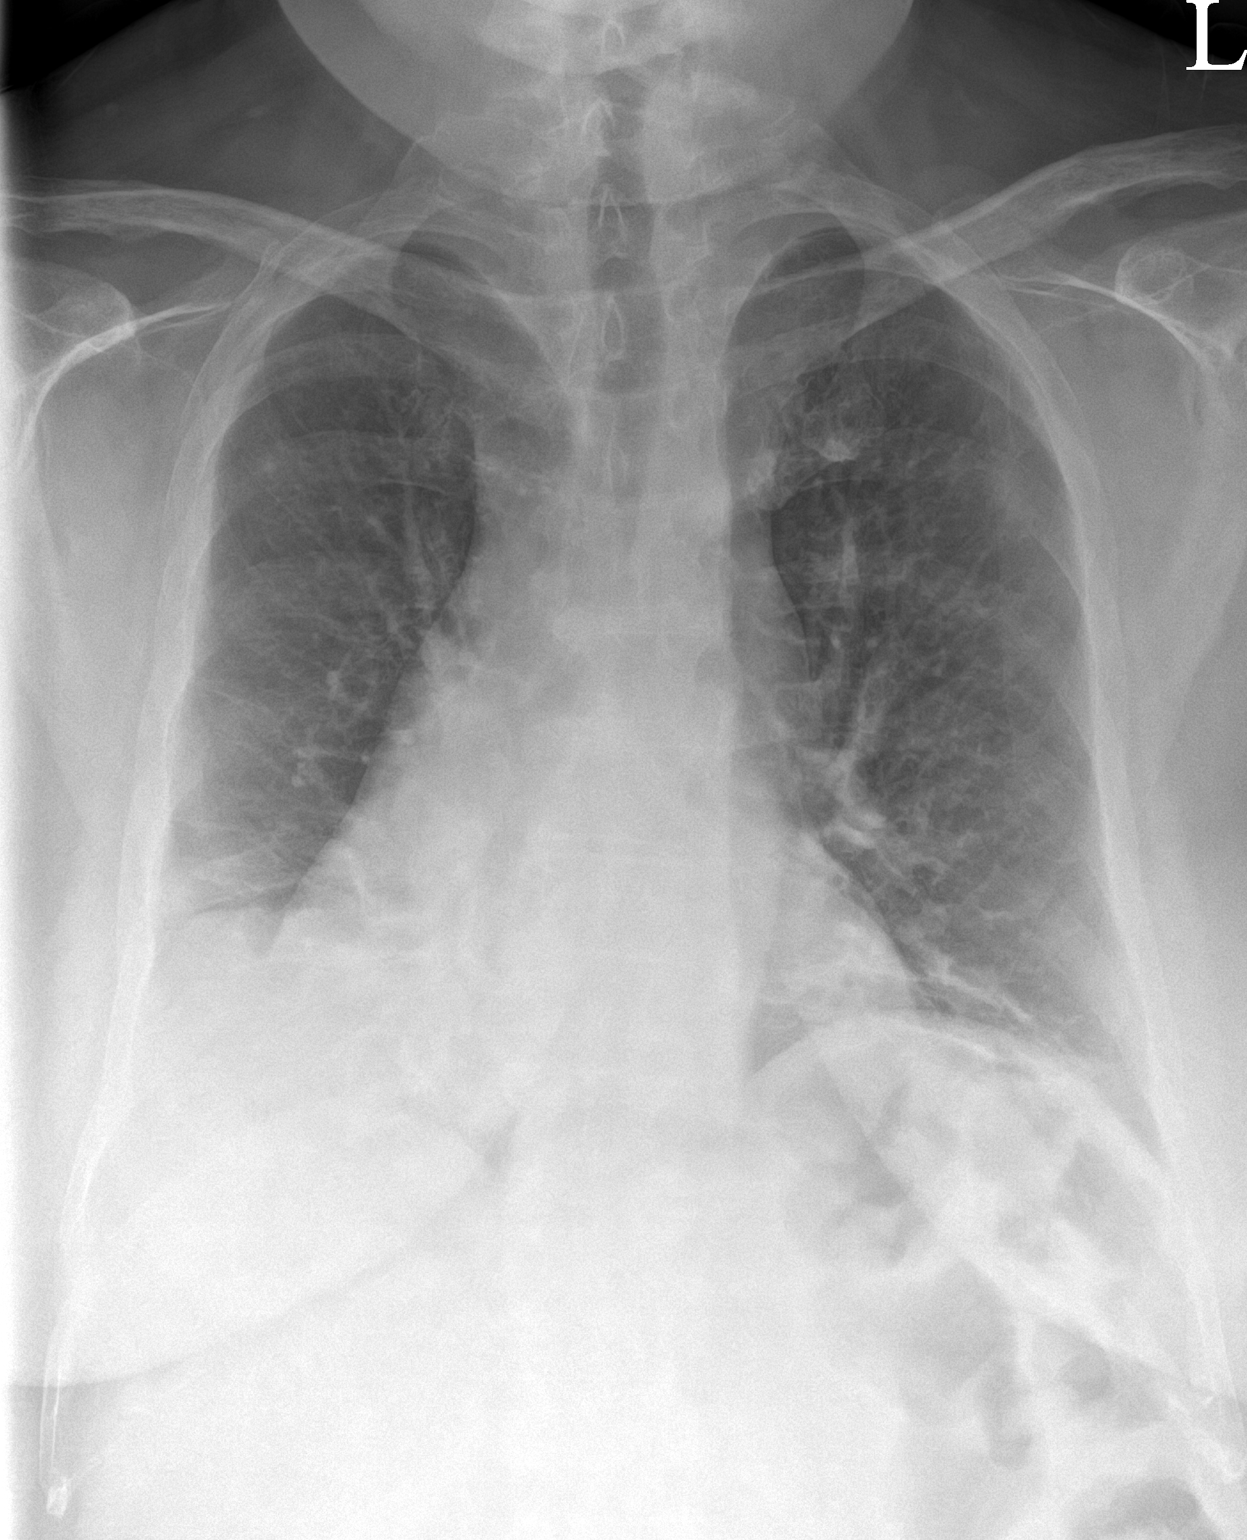

[chest lat]
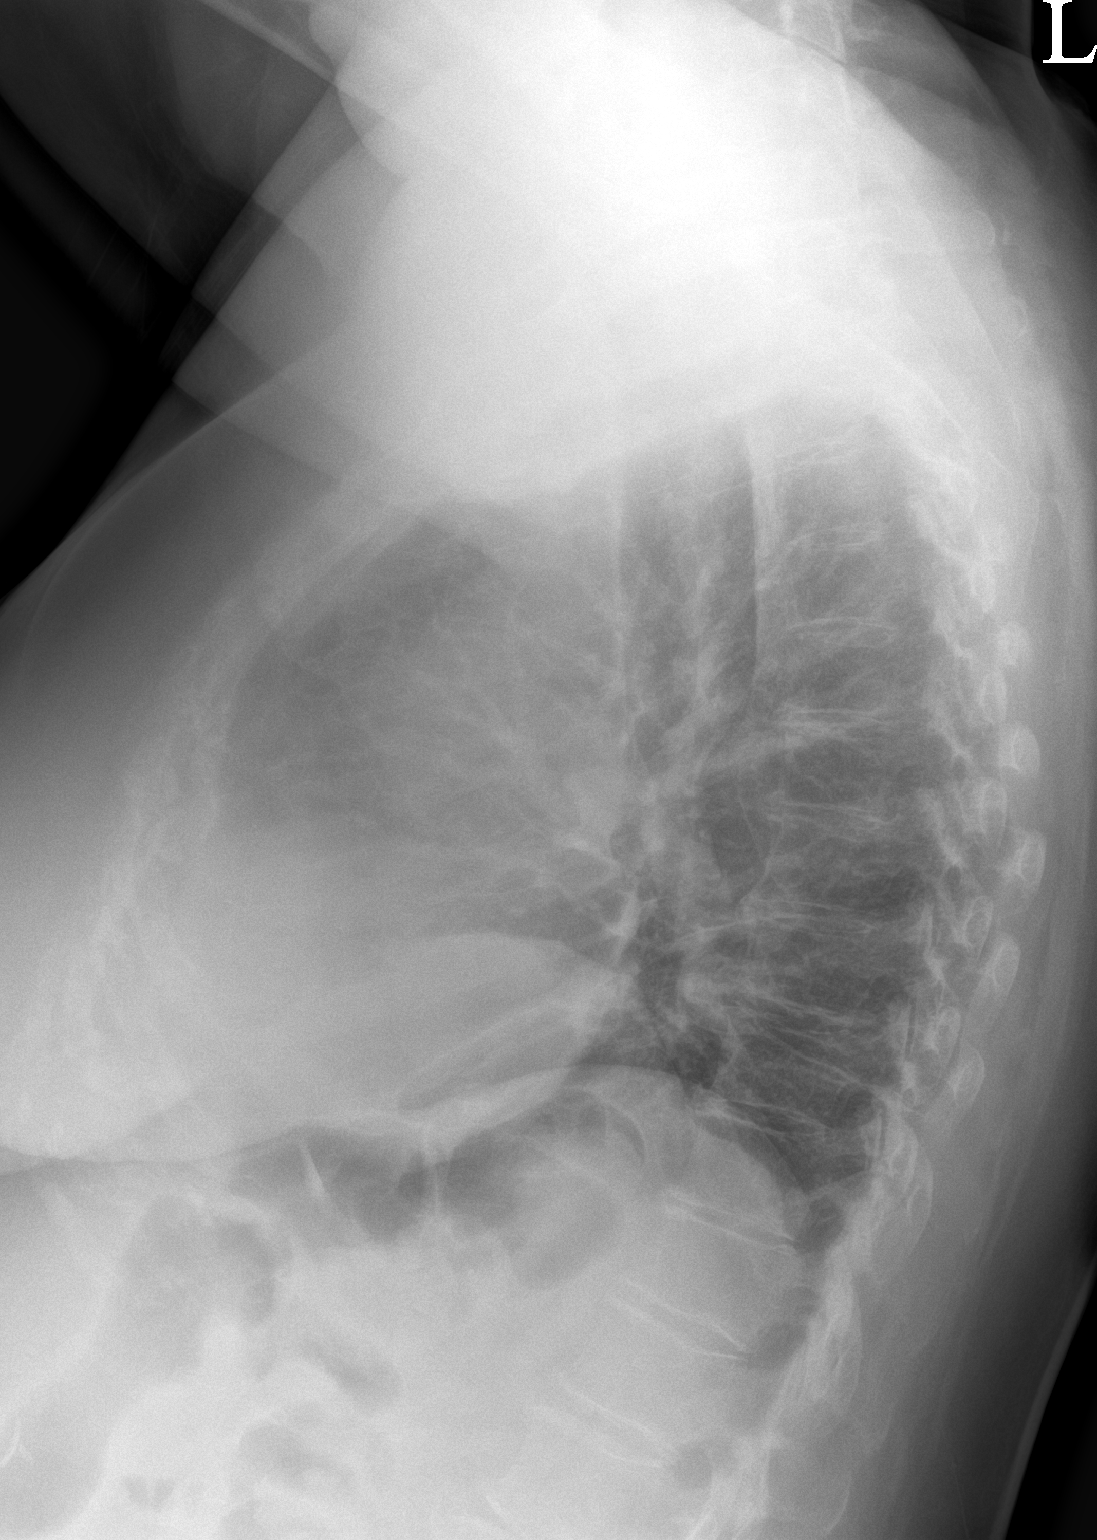

[2 of 2 positions shown; findings below may reference images not displayed]

FINDINGS: Similar chronic basilar bandlike opacities more compatible with
atelectasis and or scarring. Chronic right base vague opacity
correlates with a known diaphragmatic hernia when compared to the
prior CT of 02/12/2020.

Heart is enlarged. No CHF pattern or effusion. No pneumothorax.
Trachea midline. Degenerative changes of the spine.
IMPRESSION: Stable cardiomegaly and bibasilar atelectasis versus scarring.

Chronic right base vague opacity correlates with a known fat
containing diaphragmatic hernia by comparison CT.

## 2023-09-18 NOTE — Telephone Encounter (Signed)
 Pharmacy Patient Advocate Encounter  Received notification from [facility] ADVANTAGE/RX ADVANCE that Prior Authorization for Rybelsus  3mg  tabs  has been DENIED.  Full denial letter will be uploaded to the media tab. See denial reason below.   PA #/Case ID/Reference #: [id number]

## 2023-09-19 ENCOUNTER — Telehealth: Payer: Self-pay

## 2023-09-19 ENCOUNTER — Other Ambulatory Visit (HOSPITAL_COMMUNITY): Payer: Self-pay

## 2023-09-19 NOTE — Telephone Encounter (Signed)
 Pharmacy Patient Advocate Encounter   Received notification from CoverMyMeds that prior authorization for Rybelsus  3 is required/requested.   Insurance verification completed.   The patient is insured through [facility] ADVANTAGE/RX ADVANCE .   Per test claim: previous denial see encounter 09/18/23

## 2023-09-20 NOTE — Telephone Encounter (Signed)
 Insurance will not approve more than 1 fill of rybelsus  3mg  as it is usually only an initiation dose. Possible chance for an appeal but still very unlikely it would get approved and will likely need to consider an alternative. Do we have any rybelsus  3mg  samples in the office to provide patient with while Darrelyn is out of office, and then she can discuss options with patient upon her return?

## 2023-09-20 NOTE — Telephone Encounter (Signed)
 Can we try an alternative medication for this patient?

## 2023-09-20 NOTE — Telephone Encounter (Signed)
 Patient has been made aware.

## 2023-09-20 NOTE — Telephone Encounter (Signed)
 Anything we can do to get this patient this medicine? She has been on the medication but her insurance is now denying it and she says that it has really been helping.

## 2023-09-22 DIAGNOSIS — J9611 Chronic respiratory failure with hypoxia: Secondary | ICD-10-CM | POA: Diagnosis not present

## 2023-09-22 DIAGNOSIS — J8410 Pulmonary fibrosis, unspecified: Secondary | ICD-10-CM | POA: Diagnosis not present

## 2023-09-25 DIAGNOSIS — I480 Paroxysmal atrial fibrillation: Secondary | ICD-10-CM | POA: Diagnosis not present

## 2023-09-25 DIAGNOSIS — E119 Type 2 diabetes mellitus without complications: Secondary | ICD-10-CM | POA: Diagnosis not present

## 2023-09-25 DIAGNOSIS — I5033 Acute on chronic diastolic (congestive) heart failure: Secondary | ICD-10-CM | POA: Diagnosis not present

## 2023-09-25 DIAGNOSIS — I5181 Takotsubo syndrome: Secondary | ICD-10-CM | POA: Diagnosis not present

## 2023-10-01 ENCOUNTER — Ambulatory Visit: Payer: Self-pay | Admitting: Internal Medicine

## 2023-10-01 ENCOUNTER — Encounter: Payer: Self-pay | Admitting: Internal Medicine

## 2023-10-01 ENCOUNTER — Ambulatory Visit: Admitting: Internal Medicine

## 2023-10-01 VITALS — BP 120/68 | HR 72 | Temp 98.1°F | Ht 66.0 in | Wt 197.6 lb

## 2023-10-01 DIAGNOSIS — N390 Urinary tract infection, site not specified: Secondary | ICD-10-CM | POA: Diagnosis not present

## 2023-10-01 DIAGNOSIS — E785 Hyperlipidemia, unspecified: Secondary | ICD-10-CM

## 2023-10-01 DIAGNOSIS — I10 Essential (primary) hypertension: Secondary | ICD-10-CM | POA: Diagnosis not present

## 2023-10-01 DIAGNOSIS — E118 Type 2 diabetes mellitus with unspecified complications: Secondary | ICD-10-CM

## 2023-10-01 DIAGNOSIS — E039 Hypothyroidism, unspecified: Secondary | ICD-10-CM

## 2023-10-01 DIAGNOSIS — Z7984 Long term (current) use of oral hypoglycemic drugs: Secondary | ICD-10-CM

## 2023-10-01 DIAGNOSIS — E876 Hypokalemia: Secondary | ICD-10-CM

## 2023-10-01 DIAGNOSIS — T502X5A Adverse effect of carbonic-anhydrase inhibitors, benzothiadiazides and other diuretics, initial encounter: Secondary | ICD-10-CM | POA: Diagnosis not present

## 2023-10-01 LAB — CK: Total CK: 50 U/L (ref 17–177)

## 2023-10-01 LAB — BASIC METABOLIC PANEL WITH GFR
BUN: 12 mg/dL (ref 6–23)
CO2: 31 meq/L (ref 19–32)
Calcium: 9.9 mg/dL (ref 8.4–10.5)
Chloride: 101 meq/L (ref 96–112)
Creatinine, Ser: 0.8 mg/dL (ref 0.40–1.20)
GFR: 76.92 mL/min (ref >60.00)
Glucose, Bld: 107 mg/dL — ABNORMAL HIGH (ref 70–99)
Potassium: 3.8 meq/L (ref 3.5–5.1)
Sodium: 141 meq/L (ref 135–145)

## 2023-10-01 LAB — MAGNESIUM: Magnesium: 2 mg/dL (ref 1.5–2.5)

## 2023-10-01 LAB — TSH: TSH: 0.27 u[IU]/mL — ABNORMAL LOW (ref 0.35–5.50)

## 2023-10-01 MED ORDER — RYBELSUS 3 MG PO TABS: 3.0000 mg | ORAL_TABLET | Freq: Every day | ORAL | 0 refills | Status: DC

## 2023-10-01 MED ORDER — ATORVASTATIN CALCIUM 40 MG PO TABS: ORAL_TABLET | ORAL | 1 refills | Status: AC

## 2023-10-01 MED ORDER — FREESTYLE LIBRE 3 PLUS SENSOR MISC: 1.0000 | 1 refills | Status: DC

## 2023-10-01 MED ORDER — UNITHROID 150 MCG PO TABS: 150.0000 ug | ORAL_TABLET | Freq: Every day | ORAL | 0 refills | Status: DC

## 2023-10-01 NOTE — Progress Notes (Addendum)
 Subjective:  Patient ID: Kirsten [name], female    DOB: April 29, 1955  Age: 68 y.o. MRN: [id number]  CC: Hypertension, Diabetes, and Hyperlipidemia   HPI [name] presents for f/up ---  Discussed the use of AI scribe software for clinical note transcription with the patient, who gave verbal consent to proceed.  History of Present Illness Kirsten [name] is a 68 year old female who presents for medication management and follow-up on blood sugar control.  She has recently changed pharmacies to [facility] due to better coverage of her medication copays. She is currently taking Rybelsus  3 mg once daily in combination with metformin  for blood sugar control, and her blood sugars have been excellent on this regimen. No side effects such as nausea, vomiting, diarrhea, constipation, or abdominal pain with the 3 mg dose of Rybelsus , unlike previous doses which caused significant discomfort.  She mentions a recent low potassium level but is unsure of specific symptoms related to this. She occasionally experiences muscle cramps at night in her calves.  She had a recent urinary tract infection which has resolved after completing a course of antibiotics. She is no longer taking prednisone  or Zithromax .  She is on a thyroid  supplement and reports no drastic changes in weight, although she mentions a slight weight loss of a couple of pounds.  She requests a reorder of the Calzada device for glucose monitoring, noting that the current version will be discontinued in September.  No breathing difficulties, chest pain, shortness of breath, dizziness, lightheadedness, or coughing up blood or phlegm.    Outpatient Medications Prior to Visit  Medication Sig Dispense Refill   albuterol  (PROVENTIL ) (2.5 MG/3ML) 0.083% nebulizer solution Take 3 mLs (2.5 mg total) by nebulization every 4 (four) hours as needed for wheezing or shortness of breath. 75 mL 10   albuterol  (VENTOLIN  HFA) 108 (90 Base)  MCG/ACT inhaler Inhale 2 puffs into the lungs every 6 (six) hours as needed for wheezing or shortness of breath. 8 g 2   ALPRAZolam  (XANAX ) 0.25 MG tablet Take 1 tablet (0.25 mg total) by mouth 3 (three) times daily as needed for anxiety. 270 tablet 0   amiodarone  (PACERONE ) 200 MG tablet Take 1 tablet (200 mg total) by mouth daily. 90 tablet 0   BREO ELLIPTA  100-25 MCG/ACT AEPB INHALE ONE PUFF BY MOUTH ONE TIME DAILY 60 each 0   cyanocobalamin  (,VITAMIN B-12,) 1000 MCG/ML injection INJECT 1ML INTO THE MUSCLE ONCE A MONTH 10 mL 0   ELIQUIS  5 MG TABS tablet TAKE ONE TABLET BY MOUTH TWICE A DAY 180 tablet 1   fluticasone  (FLONASE ) 50 MCG/ACT nasal spray Place 1 spray into both nostrils daily. 18 mL 2   furosemide  (LASIX ) 20 MG tablet Take 20 mg by mouth daily.     Insulin  Pen Needle 32G X 6 MM MISC 1 Act by Does not apply route once a week. 30 each 1   losartan  (COZAAR ) 25 MG tablet Take 25 mg by mouth daily.     metFORMIN  (GLUCOPHAGE -XR) 750 MG 24 hr tablet Take 1 tablet (750 mg total) by mouth daily with breakfast. 90 tablet 1   ondansetron  (ZOFRAN -ODT) 4 MG disintegrating tablet Take 1 tablet (4 mg total) by mouth every 8 (eight) hours as needed for nausea or vomiting. 12 tablet 0   OXYGEN  Inhale 2 L into the lungs at bedtime. Uses with CPAP     PARoxetine  (PAXIL -CR) 25 MG 24 hr tablet TAKE ONE TABLET BY MOUTH  ONE TIME DAILY 90 tablet 1   torsemide (DEMADEX) 20 MG tablet Take 20 mg by mouth as needed (Edema in legs).     atorvastatin  (LIPITOR) 40 MG tablet TAKE ONE TABLET BY MOUTH ONE TIME DAILY 90 tablet 1   azithromycin  (ZITHROMAX  Z-PAK) 250 MG tablet Take 2 tablets on Day 1 and then 1 tablet daily till gone. 6 each 0   Continuous Glucose Sensor (FREESTYLE LIBRE 3 SENSOR) MISC Place 1 Act onto the skin daily. 2 each 5   levothyroxine  (SYNTHROID ) 175 MCG tablet TAKE ONE TABLET BY MOUTH ONE TIME DAILY 90 tablet 0   metoprolol  succinate (TOPROL -XL) 25 MG 24 hr tablet Take 0.5 tablets (12.5 mg  total) by mouth daily. (Patient taking differently: Take 25 mg by mouth daily.) 15 tablet 0   nitrofurantoin , macrocrystal-monohydrate, (MACROBID ) 100 MG capsule Take 1 capsule (100 mg total) by mouth 2 (two) times daily. 10 capsule 0   predniSONE  (DELTASONE ) 20 MG tablet Take 1 tablet (20 mg total) by mouth daily with breakfast. 7 days 7 tablet 1   predniSONE  (DELTASONE ) 20 MG tablet Take 1 tablet (20 mg total) by mouth daily with breakfast. 10 days 10 tablet 1   Semaglutide  (RYBELSUS ) 3 MG TABS Take 1 tablet (3 mg total) by mouth daily. (Patient not taking: Reported on 10/01/2023) 90 tablet 0   No facility-administered medications prior to visit.    ROS Review of Systems  Constitutional:  Negative for appetite change, chills, diaphoresis, fatigue and fever.  HENT: Negative.    Eyes: Negative.   Respiratory: Negative.  Negative for cough, chest tightness, shortness of breath and wheezing.   Cardiovascular:  Negative for chest pain, palpitations and leg swelling.  Gastrointestinal:  Negative for abdominal pain, blood in stool, constipation, diarrhea, nausea and vomiting.  Endocrine: Negative.   Genitourinary:  Negative for difficulty urinating, dysuria, hematuria and urgency.  Musculoskeletal:  Positive for arthralgias and myalgias.  Skin: Negative.   Neurological:  Negative for dizziness, weakness and light-headedness.  Hematological:  Negative for adenopathy. Does not bruise/bleed easily.  Psychiatric/Behavioral: Negative.      Objective:  BP 120/68 (BP Location: Right Arm, Patient Position: Sitting, Cuff Size: Small)   Pulse 72   Temp 98.1 F (36.7 C) (Oral)   Ht 5' 6 (1.676 m)   Wt 197 lb 9.6 oz (89.6 kg)   SpO2 95%   BMI 31.89 kg/m   BP Readings from Last 3 Encounters:  10/01/23 120/68  08/07/23 120/80  07/25/23 (!) 139/100    Wt Readings from Last 3 Encounters:  10/01/23 197 lb 9.6 oz (89.6 kg)  09/16/23 200 lb (90.7 kg)  08/07/23 200 lb (90.7 kg)    Physical  Exam Vitals reviewed.  Constitutional:      Appearance: Normal appearance.  HENT:     Mouth/Throat:     Mouth: Mucous membranes are moist.  Eyes:     General: No scleral icterus.    Conjunctiva/sclera: Conjunctivae normal.  Cardiovascular:     Rate and Rhythm: Normal rate and regular rhythm.     Heart sounds: No murmur heard.    No friction rub. No gallop.  Pulmonary:     Breath sounds: No stridor. Examination of the right-lower field reveals rales. Examination of the left-lower field reveals rales. Rales present. No decreased breath sounds, wheezing or rhonchi.  Abdominal:     General: Abdomen is flat.     Palpations: There is no mass.     Tenderness: There is  no abdominal tenderness. There is no guarding.     Hernia: No hernia is present.  Musculoskeletal:        General: Normal range of motion.     Cervical back: Neck supple.     Right lower leg: No edema.     Left lower leg: No edema.  Lymphadenopathy:     Cervical: No cervical adenopathy.  Skin:    General: Skin is warm and dry.  Neurological:     General: No focal deficit present.     Mental Status: She is alert.  Psychiatric:        Mood and Affect: Mood normal.        Behavior: Behavior normal.     Lab Results  Component Value Date   WBC 11.8 (H) 07/25/2023   HGB 14.1 07/25/2023   HCT 42.7 07/25/2023   PLT 322 07/25/2023   GLUCOSE 107 (H) 10/01/2023   CHOL 154 03/06/2023   TRIG 183.0 (H) 03/06/2023   HDL 54.70 03/06/2023   LDLDIRECT 86.0 09/04/2022   LDLCALC 63 03/06/2023   ALT 35 07/25/2023   AST 27 07/25/2023   NA 141 10/01/2023   K 3.8 10/01/2023   CL 101 10/01/2023   CREATININE 0.80 10/01/2023   BUN 12 10/01/2023   CO2 31 10/01/2023   TSH 0.27 (L) 10/01/2023   INR 1.3 (H) 04/16/2022   HGBA1C 7.1 (H) 06/10/2023   MICROALBUR 4.8 (H) 06/10/2023    MM 3D DIAGNOSTIC MAMMOGRAM BILATERAL BREAST Result Date: 09/17/2023 CLINICAL DATA:  Palpable area in the LEFT upper breast EXAM: DIGITAL DIAGNOSTIC  BILATERAL MAMMOGRAM WITH TOMOSYNTHESIS AND CAD; ULTRASOUND LEFT BREAST LIMITED TECHNIQUE: Bilateral digital diagnostic mammography and breast tomosynthesis was performed. The images were evaluated with computer-aided detection. ; Targeted ultrasound examination of the left breast was performed. COMPARISON:  Previous exam(s). ACR Breast Density Category a: The breasts are almost entirely fatty. FINDINGS: Diagnostic views were obtained over the palpable area of concern in the LEFT breast. No suspicious mammographic finding is identified in this area. Fat density is noted subjacent to the site of palpable concern. No suspicious mass, microcalcification, or other finding is identified in either breast. On physical exam, there is a soft smooth mobile mass appreciated at the site of palpable concern in the LEFT upper inner breast Targeted LEFT breast ultrasound was performed in the palpable area of concern at the upper inner breast. At 10 o'clock 12 cm from the nipple, there is an oval circumscribed isoechoic mass versus prominent fat lobule. This measures 2.4 x 1.0 x 2.0 cm. No suspicious internal nodularity or vascularity. This corresponds to the site of palpable concern is most consistent with a benign lipoma versus prominent fat lobule. IMPRESSION: 1. There is a benign lipoma versus prominent fat lobule at the site of palpable concern in LEFT breast. Any further workup of the patient's symptoms should be based on the clinical assessment. Recommend routine annual screening mammogram in 1 year. 2. No mammographic evidence of malignancy bilaterally. RECOMMENDATION: Screening mammogram in one year.(Code:SM-B-01Y) Clinical follow-up would be recommended for lipoma. Surgical excision could be considered if there is clinical evidence of interval growth or new pain. I have discussed the findings and recommendations with the patient. If applicable, a reminder letter will be sent to the patient regarding the next appointment.  BI-RADS CATEGORY  2: Benign. Electronically Signed   By: Corean Salter M.D.   On: 09/17/2023 10:11   US  LIMITED ULTRASOUND INCLUDING AXILLA LEFT BREAST  Result Date:  09/17/2023 CLINICAL DATA:  Palpable area in the LEFT upper breast EXAM: DIGITAL DIAGNOSTIC BILATERAL MAMMOGRAM WITH TOMOSYNTHESIS AND CAD; ULTRASOUND LEFT BREAST LIMITED TECHNIQUE: Bilateral digital diagnostic mammography and breast tomosynthesis was performed. The images were evaluated with computer-aided detection. ; Targeted ultrasound examination of the left breast was performed. COMPARISON:  Previous exam(s). ACR Breast Density Category a: The breasts are almost entirely fatty. FINDINGS: Diagnostic views were obtained over the palpable area of concern in the LEFT breast. No suspicious mammographic finding is identified in this area. Fat density is noted subjacent to the site of palpable concern. No suspicious mass, microcalcification, or other finding is identified in either breast. On physical exam, there is a soft smooth mobile mass appreciated at the site of palpable concern in the LEFT upper inner breast Targeted LEFT breast ultrasound was performed in the palpable area of concern at the upper inner breast. At 10 o'clock 12 cm from the nipple, there is an oval circumscribed isoechoic mass versus prominent fat lobule. This measures 2.4 x 1.0 x 2.0 cm. No suspicious internal nodularity or vascularity. This corresponds to the site of palpable concern is most consistent with a benign lipoma versus prominent fat lobule. IMPRESSION: 1. There is a benign lipoma versus prominent fat lobule at the site of palpable concern in LEFT breast. Any further workup of the patient's symptoms should be based on the clinical assessment. Recommend routine annual screening mammogram in 1 year. 2. No mammographic evidence of malignancy bilaterally. RECOMMENDATION: Screening mammogram in one year.(Code:SM-B-01Y) Clinical follow-up would be recommended for  lipoma. Surgical excision could be considered if there is clinical evidence of interval growth or new pain. I have discussed the findings and recommendations with the patient. If applicable, a reminder letter will be sent to the patient regarding the next appointment. BI-RADS CATEGORY  2: Benign. Electronically Signed   By: Corean Salter M.D.   On: 09/17/2023 10:11    Assessment & Plan:   Hypothyroidism, unspecified type- TSH is suppressed. Will lower the T4 dose. -     TSH; Future -     Levothyroxine  Sodium; Take 1 tablet (150 mcg total) by mouth daily.  Dispense: 90 tablet; Refill: 0  Hyperlipidemia with target LDL less than 100 -     Atorvastatin  Calcium ; TAKE ONE TABLET BY MOUTH ONE TIME DAILY  Dispense: 90 tablet; Refill: 1 -     TSH; Future -     CK; Future  Type II diabetes mellitus with manifestations (HCC)- A1C was not completed. -     Rybelsus ; Take 1 tablet (3 mg total) by mouth daily. Taking with metformin  to control the blood sugar  Dispense: 90 tablet; Refill: 0 -     Basic metabolic panel with GFR; Future -     FreeStyle Libre 3 Plus Sensor; Apply 1 Act topically every 14 (fourteen) days. Change sensor every 15 days.  Dispense: 6 each; Refill: 1  Essential hypertension, benign- BP is well controlled. -     TSH; Future -     Basic metabolic panel with GFR; Future  Diuretic-induced hypokalemia -     Magnesium ; Future -     Basic metabolic panel with GFR; Future  Recurrent UTI (urinary tract infection) -     CULTURE, URINE COMPREHENSIVE; Future     Follow-up: Return in about 3 months (around 01/01/2024).  Debby Molt, MD

## 2023-10-02 ENCOUNTER — Other Ambulatory Visit (HOSPITAL_COMMUNITY): Payer: Self-pay

## 2023-10-02 ENCOUNTER — Telehealth: Payer: Self-pay | Admitting: Gastroenterology

## 2023-10-02 NOTE — Telephone Encounter (Addendum)
 Procedure:Endoscopy Procedure date: 10/10/23 Procedure location: [facility] Arrival Time: 6:00 am Spoke with the patient Y/N: Yes Any prep concerns? No  Has the patient obtained the prep from the pharmacy ? No prep needed Do you have a care partner and transportation: Yes Any additional concerns? No

## 2023-10-03 ENCOUNTER — Telehealth: Payer: Self-pay

## 2023-10-03 LAB — CULTURE, URINE COMPREHENSIVE

## 2023-10-03 MED ORDER — LEVOTHYROXINE SODIUM 150 MCG PO TABS: 150.0000 ug | ORAL_TABLET | Freq: Every day | ORAL | 0 refills | Status: DC

## 2023-10-03 NOTE — Telephone Encounter (Signed)
 Copied from CRM #[id number]. Topic: Clinical - Medication Question >> Oct 03, 2023  9:15 AM Kirsten [name] wrote: Reason for CRM:  Patient called stating the pharmacy contacted her regarding Rx: UNITHROID  150 MCG tablet Her insurance will not cover this medication and she was requesting an alternative that she can be treated with that is covered by her current insurance.    Patient had a Urine Culture done 10/01/2023 during her visit, and the results are not yet in her chart to review with the PCP.   PCP/PCP Team Please Advise.

## 2023-10-04 DIAGNOSIS — G4733 Obstructive sleep apnea (adult) (pediatric): Secondary | ICD-10-CM | POA: Diagnosis not present

## 2023-10-07 DIAGNOSIS — J8410 Pulmonary fibrosis, unspecified: Secondary | ICD-10-CM | POA: Diagnosis not present

## 2023-10-07 DIAGNOSIS — J9611 Chronic respiratory failure with hypoxia: Secondary | ICD-10-CM | POA: Diagnosis not present

## 2023-10-07 DIAGNOSIS — G4733 Obstructive sleep apnea (adult) (pediatric): Secondary | ICD-10-CM | POA: Diagnosis not present

## 2023-10-07 NOTE — Telephone Encounter (Signed)
 Medication has been changed in the patients chart.

## 2023-10-09 NOTE — Anesthesia Preprocedure Evaluation (Signed)
 Anesthesia Evaluation  Patient identified by MRN, date of birth, ID band Patient awake    Reviewed: Allergy  & Precautions, NPO status , Patient's Chart, lab work & pertinent test results  History of Anesthesia Complications (+) PONV and history of anesthetic complications  Airway Mallampati: IV  TM Distance: >3 FB Neck ROM: Full    Dental  (+) Dental Advisory Given, Teeth Intact   Pulmonary asthma , sleep apnea (oxygen  at night), Continuous Positive Airway Pressure Ventilation and Oxygen  sleep apnea    Pulmonary exam normal breath sounds clear to auscultation       Cardiovascular hypertension, Pt. on medications Normal cardiovascular exam+ dysrhythmias Atrial Fibrillation  Rhythm:Regular Rate:Normal  TTE 2023  1. Left ventricular ejection fraction, by estimation, is 55 to 60%. The  left ventricle has normal function. The left ventricle has no regional  wall motion abnormalities. Left ventricular diastolic parameters are  indeterminate.   2. Right ventricular systolic function is normal. The right ventricular  size is normal.   3. The mitral valve is normal in structure. No evidence of mitral valve  regurgitation. No evidence of mitral stenosis.   4. The aortic valve is normal in structure. Aortic valve regurgitation is  not visualized. No aortic stenosis is present.   5. The inferior vena cava is normal in size with greater than 50%  respiratory variability, suggesting right atrial pressure of 3 mmHg.   Cath 2023 Mid LAD lesion is 30% stenosed.   There is mild to moderate left ventricular systolic dysfunction.   LV end diastolic pressure is moderately elevated.   The left ventricular ejection fraction is 35-45% by visual estimate.   1.  Elevated pulmonary capillary wedge pressure, LVEDP consistent with pulmonary edema 2.  Nonobstructive coronary artery disease 3.  Mild to moderate reduced left ventricular function with  apical wall akinesis consistent with Takotsubo's cardiomyopathy following recent fall and multiple rib and clavicular fracture     Neuro/Psych  PSYCHIATRIC DISORDERS Anxiety Depression    negative neurological ROS     GI/Hepatic Neg liver ROS,GERD  ,,NASH   Endo/Other  diabetes, Type 2, Oral Hypoglycemic AgentsHypothyroidism    Renal/GU negative Renal ROS  negative genitourinary   Musculoskeletal  (+) Arthritis ,    Abdominal   Peds  Hematology  (+) Blood dyscrasia (eliquis )   Anesthesia Other Findings   Reproductive/Obstetrics                              Anesthesia Physical Anesthesia Plan  ASA: 3  Anesthesia Plan: MAC   Post-op Pain Management:    Induction: Intravenous  PONV Risk Score and Plan: 3 and Propofol  infusion and Treatment may vary due to age or medical condition  Airway Management Planned: Natural Airway  Additional Equipment:   Intra-op Plan:   Post-operative Plan:   Informed Consent: I have reviewed the patients History and Physical, chart, labs and discussed the procedure including the risks, benefits and alternatives for the proposed anesthesia with the patient or authorized representative who has indicated his/her understanding and acceptance.     Dental advisory given  Plan Discussed with: CRNA  Anesthesia Plan Comments:          Anesthesia Quick Evaluation

## 2023-10-10 ENCOUNTER — Ambulatory Visit (HOSPITAL_COMMUNITY)
Admission: RE | Admit: 2023-10-10 | Discharge: 2023-10-10 | Disposition: A | Discharge status: Home / Self Care | Attending: Gastroenterology | Admitting: Gastroenterology

## 2023-10-10 ENCOUNTER — Ambulatory Visit (HOSPITAL_COMMUNITY): Payer: Self-pay | Admitting: Anesthesiology

## 2023-10-10 ENCOUNTER — Encounter (HOSPITAL_COMMUNITY)
Admission: RE | Disposition: A | Discharge status: Home / Self Care | Payer: Self-pay | Source: Home / Self Care | Attending: Gastroenterology

## 2023-10-10 ENCOUNTER — Encounter (HOSPITAL_COMMUNITY): Payer: Self-pay | Admitting: Gastroenterology

## 2023-10-10 ENCOUNTER — Other Ambulatory Visit: Payer: Self-pay

## 2023-10-10 DIAGNOSIS — K449 Diaphragmatic hernia without obstruction or gangrene: Secondary | ICD-10-CM | POA: Diagnosis not present

## 2023-10-10 DIAGNOSIS — E119 Type 2 diabetes mellitus without complications: Secondary | ICD-10-CM | POA: Insufficient documentation

## 2023-10-10 DIAGNOSIS — R1310 Dysphagia, unspecified: Secondary | ICD-10-CM

## 2023-10-10 DIAGNOSIS — Z7901 Long term (current) use of anticoagulants: Secondary | ICD-10-CM | POA: Insufficient documentation

## 2023-10-10 DIAGNOSIS — K3189 Other diseases of stomach and duodenum: Secondary | ICD-10-CM | POA: Diagnosis not present

## 2023-10-10 DIAGNOSIS — Z7984 Long term (current) use of oral hypoglycemic drugs: Secondary | ICD-10-CM | POA: Diagnosis not present

## 2023-10-10 DIAGNOSIS — F32A Depression, unspecified: Secondary | ICD-10-CM | POA: Diagnosis not present

## 2023-10-10 DIAGNOSIS — I10 Essential (primary) hypertension: Secondary | ICD-10-CM | POA: Diagnosis not present

## 2023-10-10 DIAGNOSIS — G4733 Obstructive sleep apnea (adult) (pediatric): Secondary | ICD-10-CM | POA: Diagnosis not present

## 2023-10-10 DIAGNOSIS — R1312 Dysphagia, oropharyngeal phase: Secondary | ICD-10-CM | POA: Diagnosis not present

## 2023-10-10 DIAGNOSIS — R1319 Other dysphagia: Secondary | ICD-10-CM

## 2023-10-10 DIAGNOSIS — R933 Abnormal findings on diagnostic imaging of other parts of digestive tract: Secondary | ICD-10-CM | POA: Diagnosis not present

## 2023-10-10 DIAGNOSIS — E039 Hypothyroidism, unspecified: Secondary | ICD-10-CM

## 2023-10-10 DIAGNOSIS — J45909 Unspecified asthma, uncomplicated: Secondary | ICD-10-CM | POA: Diagnosis not present

## 2023-10-10 DIAGNOSIS — Z79899 Other long term (current) drug therapy: Secondary | ICD-10-CM | POA: Diagnosis not present

## 2023-10-10 DIAGNOSIS — I4891 Unspecified atrial fibrillation: Secondary | ICD-10-CM | POA: Diagnosis not present

## 2023-10-10 DIAGNOSIS — F419 Anxiety disorder, unspecified: Secondary | ICD-10-CM | POA: Insufficient documentation

## 2023-10-10 DIAGNOSIS — I480 Paroxysmal atrial fibrillation: Secondary | ICD-10-CM | POA: Diagnosis not present

## 2023-10-10 HISTORY — DX: Pneumonia, unspecified organism: J18.9

## 2023-10-10 HISTORY — DX: Nausea with vomiting, unspecified: R11.2

## 2023-10-10 HISTORY — DX: Unspecified osteoarthritis, unspecified site: M19.90

## 2023-10-10 HISTORY — DX: Other specified postprocedural states: Z98.890

## 2023-10-10 HISTORY — DX: Hypothyroidism, unspecified: E03.9

## 2023-10-10 SURGERY — EGD (ESOPHAGOGASTRODUODENOSCOPY)
Anesthesia: Monitor Anesthesia Care

## 2023-10-10 MED ORDER — IPRATROPIUM-ALBUTEROL 0.5-2.5 (3) MG/3ML IN SOLN
3.0000 mL | Freq: Once | RESPIRATORY_TRACT | Status: AC
  Administered 2023-10-10: 3 mL via RESPIRATORY_TRACT

## 2023-10-10 MED ORDER — PHENYLEPHRINE HCL (PRESSORS) 10 MG/ML IV SOLN: INTRAVENOUS | Status: DC | PRN

## 2023-10-10 MED ORDER — LIDOCAINE 2% (20 MG/ML) 5 ML SYRINGE
INTRAMUSCULAR | Status: DC | PRN
  Administered 2023-10-10: 100 mg via INTRAVENOUS

## 2023-10-10 MED ORDER — IPRATROPIUM-ALBUTEROL 0.5-2.5 (3) MG/3ML IN SOLN
RESPIRATORY_TRACT | Status: AC
Start: 2023-10-10 — End: 2023-10-10
  Filled 2023-10-10: qty 3

## 2023-10-10 MED ORDER — PROPOFOL 10 MG/ML IV BOLUS
INTRAVENOUS | Status: DC | PRN
  Administered 2023-10-10: 30 mg via INTRAVENOUS
  Administered 2023-10-10: 60 mg via INTRAVENOUS
  Administered 2023-10-10 (×4): 20 mg via INTRAVENOUS

## 2023-10-10 MED ORDER — SODIUM CHLORIDE 0.9 % IV SOLN: INTRAVENOUS | Status: DC

## 2023-10-10 NOTE — Op Note (Signed)
 [facility] Patient [name] Procedure Date : 10/10/2023 MRN: [id number] Attending MD: Glendia BRAVO. Stacia , MD, [id number] Date of Birth: 1955/02/23 CSN: [id number] Age: 68 Admit Type: Inpatient Procedure:                Upper GI endoscopy Indications:              Dysphagia, prominent cricopharyngeal bar on MBS Providers:                Prentice Sackrider E. Stacia, MD, [name] Pouch, Corene Southgate, Technician Referring MD:              Medicines:                Monitored Anesthesia Care Complications:            No immediate complications. Estimated Blood Loss:     Estimated blood loss was minimal. Procedure:                Pre-Anesthesia Assessment:                           - Prior to the procedure, a History and Physical                            was performed, and patient medications and                            allergies were reviewed. The patient's tolerance of                            previous anesthesia was also reviewed. The risks                            and benefits of the procedure and the sedation                            options and risks were discussed with the patient.                            All questions were answered, and informed consent                            was obtained. Prior Anticoagulants: The patient has                            taken Eliquis  (apixaban ), last dose was 2 days                            prior to procedure. ASA Grade Assessment: III - A                            patient with severe systemic disease. After  reviewing the risks and benefits, the patient was                            deemed in satisfactory condition to undergo the                            procedure.                           After obtaining informed consent, the endoscope was                            passed under direct vision. Throughout the                            procedure, the  patient's blood pressure, pulse, and                            oxygen  saturations were monitored continuously. The                            [id number]-[id number] ([id number]) Olympus endoscope was introduced                            through the mouth, and advanced to the second part                            of duodenum. The upper GI endoscopy was                            accomplished without difficulty. The patient                            tolerated the procedure well. Scope In: Scope Out: Findings:      The examined portions of the nasopharynx, oropharynx and larynx were       normal.      The gastroesophageal flap valve was visualized endoscopically and       classified as Hill Grade IV (no fold, wide open lumen, hiatal hernia       present).      The examined esophagus was normal. A guidewire was placed and the scope       was withdrawn. Dilation was performed with a Savary dilator with mild       resistance at 17 mm. The dilation site was examined following endoscope       reinsertion and showed no change. Estimated blood loss: none. Biopsies       were obtained from the proximal and distal esophagus with cold forceps       for histology of suspected eosinophilic esophagitis. Estimated blood       loss was minimal.      Patchy mildly erythematous mucosa was found in the gastric antrum.       Biopsies were taken with a cold forceps for Helicobacter pylori testing.       Estimated blood loss was minimal.      The exam of the stomach was otherwise normal.  The examined duodenum was normal. Impression:               - The examined portions of the nasopharynx,                            oropharynx and larynx were normal.                           - Gastroesophageal flap valve classified as Hill                            Grade IV (no fold, wide open lumen, hiatal hernia                            present).                           - Normal esophagus. Dilated. - Biopsies were taken                             with a cold forceps for evaluation of eosinophilic                            esophagitis.                           - Erythematous mucosa in the antrum. Biopsied.                           - Normal examined duodenum.                           - No obvious endoscopic abnormalities to explain                            dysphagia. Moderate Sedation:      N/A Recommendation:           - Patient has a contact number available for                            emergencies. The signs and symptoms of potential                            delayed complications were discussed with the                            patient. Return to normal activities tomorrow.                            Written discharge instructions were provided to the                            patient.                           - Resume previous diet.                           -  Resume Eliquis  (apixaban ) at prior dose tomorrow.                           - Await pathology results.                           - Consider esophagram or esophageal manometry if                            swallowing difficulties not improving. Procedure Code(s):        --- Professional ---                           [id number], Esophagogastroduodenoscopy, flexible,                            transoral; with insertion of guide wire followed by                            passage of dilator(s) through esophagus over guide                            wire Diagnosis Code(s):        --- Professional ---                           K44.9, Diaphragmatic hernia without obstruction or                            gangrene                           K31.89, Other diseases of stomach and duodenum                           R13.10, Dysphagia, unspecified CPT copyright 2022 [employer]. All rights reserved. The codes documented in this report are preliminary and upon coder review may  be revised to meet current compliance requirements. Lonnie Reth  E. Stacia, MD 10/10/2023 8:01:51 AM This report has been signed electronically. Number of Addenda: 0

## 2023-10-10 NOTE — Transfer of Care (Signed)
 Immediate Anesthesia Transfer of Care Note  Patient: Kirsten [name]  Procedure(s) Performed: EGD (ESOPHAGOGASTRODUODENOSCOPY) EGD, WITH DILATION USING SAVARY-GILLIARD DILATOR OVER GUIDEWIRE  Patient Location: Endoscopy Unit  Anesthesia Type:MAC  Level of Consciousness: awake, alert , and oriented  Airway & Oxygen  Therapy: Patient Spontanous Breathing and Patient connected to nasal cannula oxygen   Post-op Assessment: Report given to RN and Post -op Vital signs reviewed and stable  Post vital signs: Reviewed and stable  Last Vitals:  Vitals Value Taken Time  BP 147/84 10/10/23 07:54  Temp    Pulse 88 10/10/23 07:57  Resp 16 10/10/23 07:57  SpO2 89 % 10/10/23 07:57  Vitals shown include unfiled device data.  Last Pain:  Vitals:   10/10/23 0628  TempSrc: Temporal  PainSc: 0-No pain         Complications: No notable events documented.

## 2023-10-10 NOTE — Anesthesia Postprocedure Evaluation (Signed)
 Anesthesia Post Note  Patient: Harlene LABOR Whitford  Procedure(s) Performed: EGD (ESOPHAGOGASTRODUODENOSCOPY) EGD, WITH DILATION USING SAVARY-GILLIARD DILATOR OVER GUIDEWIRE     Patient location during evaluation: Endoscopy Anesthesia Type: MAC Level of consciousness: awake and alert Pain management: pain level controlled Vital Signs Assessment: post-procedure vital signs reviewed and stable Respiratory status: spontaneous breathing, nonlabored ventilation, respiratory function stable and patient connected to nasal cannula oxygen  Cardiovascular status: blood pressure returned to baseline and stable Postop Assessment: no apparent nausea or vomiting Anesthetic complications: no   No notable events documented.  Last Vitals:  Vitals:   10/10/23 0816 10/10/23 0820  BP:  (!) 146/71  Pulse: 72 75  Resp: 19 20  Temp:    SpO2: 97% 94%    Last Pain:  Vitals:   10/10/23 0820  TempSrc:   PainSc: 0-No pain                 Chelsea Nusz L Dresden Lozito

## 2023-10-10 NOTE — H&P (Signed)
 [facility] History and Physical   Primary Care Physician:  Joshua Debby CROME, MD   Reason for Procedure:   Dysphagia  Plan:    EGD with possible dilation     HPI: Kirsten [name] is a 68 y.o. female undergoing EGD to evaluate chronic dysphagia.  She has had difficulty swallowing for about a year now.  Symptoms noticed mostly with meats and large pills.  No episodes of having to forcefully vomit stuck food.  Infrequent GERD symptoms.  MBS showed a prominent cricopharyngeal bar.  She takes Eliquis  for a-fib, last dose 8/26    Past Medical History:  Diagnosis Date   A-fib (HCC)    Anxiety disorder    Arthritis    Asthma    Gastroschisis    umphalocele, rotated organs abd repair until age 55   GERD (gastroesophageal reflux disease)    HTN (hypertension)    Hyperlipidemia    Hypothyroidism    IBS (irritable bowel syndrome)    Morbid obesity (HCC)    Target wt - 185  for BMI < 30   Obesity    OSA on CPAP    uses with 2L O2   Pneumonia    PONV (postoperative nausea and vomiting)    SBO (small bowel obstruction) (HCC)    Resolved with NG/Bowel rest around 2009   Sleep apnea    Type II or unspecified type diabetes mellitus without mention of complication, not stated as uncontrolled     Past Surgical History:  Procedure Laterality Date   BREAST EXCISIONAL BIOPSY Left 03/19/2012   neg   CESAREAN SECTION     CHOLECYSTECTOMY  1992   COLONOSCOPY  2011   2011-normal   Newborn Surgery - GI - ORGANS OUTSIDE ABDOMEN     RIGHT/LEFT HEART CATH AND CORONARY ANGIOGRAPHY N/A 10/02/2021   Procedure: RIGHT/LEFT HEART CATH AND CORONARY ANGIOGRAPHY;  Surgeon: Ammon Blunt, MD;  Location: [facility] INVASIVE CV LAB;  Service: Cardiovascular;  Laterality: N/A;   Small Bowel Repair     TUBAL LIGATION  1988    Prior to Admission medications   Medication Sig Start Date End Date Taking? Authorizing Provider  amiodarone  (PACERONE ) 200 MG tablet Take 1 tablet (200 mg total)  by mouth daily. 11/13/22  Yes Fernand Denyse DELENA, MD  atorvastatin  (LIPITOR) 40 MG tablet TAKE ONE TABLET BY MOUTH ONE TIME DAILY 10/01/23  Yes Joshua Debby CROME, MD  BREO ELLIPTA  100-25 MCG/ACT AEPB INHALE ONE PUFF BY MOUTH ONE TIME DAILY 07/29/23  Yes Parrett, Tammy S, NP  cyanocobalamin  (,VITAMIN B-12,) 1000 MCG/ML injection INJECT 1ML INTO THE MUSCLE ONCE A MONTH 08/21/21  Yes Patel, Donika K, DO  furosemide  (LASIX ) 20 MG tablet Take 20 mg by mouth daily.   Yes [provider]  Insulin  Pen Needle 32G X 6 MM MISC 1 Act by Does not apply route once a week. 03/07/23  Yes Joshua Debby CROME, MD  levothyroxine  (SYNTHROID ) 150 MCG tablet Take 1 tablet (150 mcg total) by mouth daily. 10/03/23  Yes Joshua Debby CROME, MD  losartan  (COZAAR ) 25 MG tablet Take 25 mg by mouth daily. 06/29/23  Yes [provider]  metFORMIN  (GLUCOPHAGE -XR) 750 MG 24 hr tablet Take 1 tablet (750 mg total) by mouth daily with breakfast. 06/10/23  Yes Joshua Debby CROME, MD  PARoxetine  (PAXIL -CR) 25 MG 24 hr tablet TAKE ONE TABLET BY MOUTH ONE TIME DAILY 07/17/23  Yes Joshua Debby CROME, MD  torsemide (DEMADEX) 20 MG tablet Take 20 mg  by mouth as needed (Edema in legs). 04/29/23 04/28/24 Yes [provider]  albuterol  (PROVENTIL ) (2.5 MG/3ML) 0.083% nebulizer solution Take 3 mLs (2.5 mg total) by nebulization every 4 (four) hours as needed for wheezing or shortness of breath. 10/11/22 10/11/23  Isaiah Scrivener, MD  albuterol  (VENTOLIN  HFA) 108 (90 Base) MCG/ACT inhaler Inhale 2 puffs into the lungs every 6 (six) hours as needed for wheezing or shortness of breath. 04/19/22   Amin, Sumayya, MD  ALPRAZolam  (XANAX ) 0.25 MG tablet Take 1 tablet (0.25 mg total) by mouth 3 (three) times daily as needed for anxiety. 06/26/22   Joshua Debby CROME, MD  Continuous Glucose Sensor (FREESTYLE LIBRE 3 PLUS SENSOR) MISC Apply 1 Act topically every 14 (fourteen) days. Change sensor every 15 days. 10/01/23   Joshua Debby CROME, MD  ELIQUIS  5 MG TABS tablet TAKE  ONE TABLET BY MOUTH TWICE A DAY 02/14/23   Joshua Debby CROME, MD  fluticasone  (FLONASE ) 50 MCG/ACT nasal spray Place 1 spray into both nostrils daily. 04/20/22   Amin, Sumayya, MD  ondansetron  (ZOFRAN -ODT) 4 MG disintegrating tablet Take 1 tablet (4 mg total) by mouth every 8 (eight) hours as needed for nausea or vomiting. 07/25/23   Willo Dunnings, MD  OXYGEN  Inhale 2 L into the lungs at bedtime. Uses with CPAP    [provider]  Semaglutide  (RYBELSUS ) 3 MG TABS Take 1 tablet (3 mg total) by mouth daily. Taking with metformin  to control the blood sugar 10/01/23   Joshua Debby CROME, MD    Current Facility-Administered Medications  Medication Dose Route Frequency Provider Last Rate Last Admin   0.9 %  sodium chloride  infusion   Intravenous Continuous Stacia Glendia BRAVO, MD 20 mL/hr at 10/10/23 0641 New Bag at 10/10/23 0641    Allergies as of 07/18/2023 - Review Complete 07/12/2023  Allergen Reaction Noted   Doxycycline  Rash 12/20/2020   Factive [gemifloxacin mesylate] Rash 06/07/2010   Sulfonamide derivatives Rash    Crestor  [rosuvastatin ]  10/12/2013   Gemifloxacin Rash 10/04/2014   Penicillins Hives and Rash     Family History  Problem Relation Age of Onset   Lymphoma Mother    Diabetes type II Father    Colon cancer Father    Stomach cancer Father    Diabetes Maternal Grandmother    Diabetes type I Son    Goiter Neg Hx    Breast cancer Neg Hx    Ovarian cancer Neg Hx    Esophageal cancer Neg Hx    Pancreatic cancer Neg Hx     Social History   Socioeconomic History   Marital status: Married    Spouse name: Lynwood   Number of children: 3   Years of education: Not on file   Highest education level: Not on file  Occupational History   Occupation: Retired    Comment: Scientist, physiological at [employer]  Tobacco Use   Smoking status: Never   Smokeless tobacco: Never   Tobacco comments:    tried as a teen  Advertising account planner   Vaping status: Never Used  Substance and Sexual  Activity   Alcohol use: Never    Alcohol/week: 2.0 standard drinks of alcohol    Types: 2 Glasses of wine per week   Drug use: No   Sexual activity: Yes    Birth control/protection: Surgical  Other Topics Concern   Not on file  Social History Narrative   Regular Exercise -  YES   Daily Caffeine  Use:  2  Active and independent at baseline   Right handed   Lives in a one story home with spouse/2025      Social Drivers of Health   Financial Resource Strain: Low Risk  (09/16/2023)   Overall Financial Resource Strain (CARDIA)    Difficulty of Paying Living Expenses: Not very hard  Food Insecurity: No Food Insecurity (09/16/2023)   Hunger Vital Sign    Worried About Running Out of Food in the Last Year: Never true    Ran Out of Food in the Last Year: Never true  Transportation Needs: No Transportation Needs (09/16/2023)   PRAPARE - Administrator, Civil Service (Medical): No    Lack of Transportation (Non-Medical): No  Physical Activity: Insufficiently Active (09/16/2023)   Exercise Vital Sign    Days of Exercise per Week: 7 days    Minutes of Exercise per Session: 20 min  Stress: No Stress Concern Present (09/16/2023)   [employer] of Occupational Health - Occupational Stress Questionnaire    Feeling of Stress: Not at all  Social Connections: Moderately Isolated (09/16/2023)   Social Connection and Isolation Panel    Frequency of Communication with Friends and Family: More than three times a week    Frequency of Social Gatherings with Friends and Family: Twice a week    Attends Religious Services: Never    Database administrator or Organizations: No    Attends Banker Meetings: Never    Marital Status: Married  Catering manager Violence: Not At Risk (09/16/2023)   Humiliation, Afraid, Rape, and Kick questionnaire    Fear of Current or Ex-Partner: No    Emotionally Abused: No    Physically Abused: No    Sexually Abused: No    Review of Systems:  All  other review of systems negative except as mentioned in the HPI.  Physical Exam: Vital signs BP (!) 156/69   Pulse 68   Temp (!) 97.5 F (36.4 C) (Temporal)   Resp (!) 21   Ht 5' 7 (1.702 m)   Wt 88.5 kg   SpO2 95%   BMI 30.54 kg/m   General:   Alert,  Well-developed, well-nourished, pleasant and cooperative in NAD Airway:  Mallampati 4 Lungs:  Clear throughout to auscultation.   Heart:  Regular rate and rhythm; no murmurs, clicks, rubs,  or gallops. Abdomen:  Soft, nontender and nondistended. Normal bowel sounds.   Neuro/Psych:  Normal mood and affect. A and O x 3   Nechama Escutia E. Stacia, MD [facility]

## 2023-10-10 NOTE — Discharge Instructions (Signed)
 YOU HAD AN ENDOSCOPIC PROCEDURE TODAY: Refer to the procedure report and other information in the discharge instructions given to you for any specific questions about what was found during the examination. If this information does not answer your questions, please call [place] office at [phone] to clarify.   YOU SHOULD EXPECT: Some feelings of bloating in the abdomen. Passage of more gas than usual. Walking can help get rid of the air that was put into your GI tract during the procedure and reduce the bloating.  DIET: Your first meal following the procedure should be a light meal and then it is ok to progress to your normal diet. A half-sandwich or bowl of soup is an example of a good first meal. Heavy or fried foods are harder to digest and may make you feel nauseous or bloated. Drink plenty of fluids but you should avoid alcoholic beverages for 24 hours. If you had a esophageal dilation, please see attached instructions for diet.    ACTIVITY: Your care partner should take you home directly after the procedure. You should plan to take it easy, moving slowly for the rest of the day. You can resume normal activity the day after the procedure however YOU SHOULD NOT DRIVE, use power tools, machinery or perform tasks that involve climbing or major physical exertion for 24 hours (because of the sedation medicines used during the test).   SYMPTOMS TO REPORT IMMEDIATELY: A gastroenterologist can be reached at any hour. Please call [phone]  for any of the following symptoms:   Following upper endoscopy (EGD, EUS, ERCP, esophageal dilation) Vomiting of blood or coffee ground material  New, significant abdominal pain  New, significant chest pain or pain under the shoulder blades  Painful or persistently difficult swallowing  New shortness of breath  Black, tarry-looking or red, bloody stools  FOLLOW UP:  If any biopsies were taken you will be contacted by phone or by letter within the next 1-3  weeks. Call [phone]  if you have not heard about the biopsies in 3 weeks.  Please also call with any specific questions about appointments or follow up tests.

## 2023-10-11 LAB — SURGICAL PATHOLOGY

## 2023-10-15 ENCOUNTER — Encounter (HOSPITAL_COMMUNITY): Payer: Self-pay | Admitting: Gastroenterology

## 2023-10-15 ENCOUNTER — Ambulatory Visit: Payer: Self-pay | Admitting: Gastroenterology

## 2023-10-15 NOTE — Progress Notes (Signed)
 Kirsten [name],  The biopsies taken from your stomach were notable for mild reactive gastropathy which is a common finding and often related to use of certain medications (usually NSAIDs), but there was no evidence of Helicobacter pylori infection. This common finding is not felt to necessarily be a cause of any particular symptom and there is no specific treatment or further evaluation recommended.  The biopsies of your esophagus were normal.  There was no evidence of damage from acid reflux and no evidence of eosinophilic esophagitis.  If you continue to have significant swallowing problems, I would recommend we proceed with further evaluation, either barium swallow or esophageal manometry.

## 2023-11-04 DIAGNOSIS — G4733 Obstructive sleep apnea (adult) (pediatric): Secondary | ICD-10-CM | POA: Diagnosis not present

## 2023-11-07 ENCOUNTER — Encounter: Payer: Self-pay | Admitting: Internal Medicine

## 2023-11-07 ENCOUNTER — Ambulatory Visit: Admitting: Internal Medicine

## 2023-11-07 VITALS — BP 120/60 | HR 72 | Temp 98.3°F | Ht 67.0 in | Wt 201.0 lb

## 2023-11-07 DIAGNOSIS — J4520 Mild intermittent asthma, uncomplicated: Secondary | ICD-10-CM

## 2023-11-07 DIAGNOSIS — J9611 Chronic respiratory failure with hypoxia: Secondary | ICD-10-CM

## 2023-11-07 DIAGNOSIS — G4733 Obstructive sleep apnea (adult) (pediatric): Secondary | ICD-10-CM | POA: Diagnosis not present

## 2023-11-07 DIAGNOSIS — I4891 Unspecified atrial fibrillation: Secondary | ICD-10-CM | POA: Diagnosis not present

## 2023-11-07 DIAGNOSIS — E119 Type 2 diabetes mellitus without complications: Secondary | ICD-10-CM

## 2023-11-07 MED ORDER — ALBUTEROL SULFATE HFA 108 (90 BASE) MCG/ACT IN AERS: 2.0000 | INHALATION_SPRAY | Freq: Four times a day (QID) | RESPIRATORY_TRACT | 11 refills | Status: AC | PRN

## 2023-11-07 MED ORDER — FLUTICASONE FUROATE-VILANTEROL 100-25 MCG/ACT IN AEPB: 1.0000 | INHALATION_SPRAY | Freq: Every day | RESPIRATORY_TRACT | 10 refills | Status: DC

## 2023-11-07 NOTE — Patient Instructions (Addendum)
 Excellent Job A+ GOLD STAR!!  Continue CPAP as prescribed  Patient Instructions Continue to use CPAP every night, minimum of 4-6 hours a night.  Change equipment every 30 days or as directed by DME.  Wash your tubing with warm soap and water daily, hang to dry. Wash humidifier portion weekly. Use bottled, distilled water and change daily   Be aware of reduced alertness and do not drive or operate heavy machinery if experiencing this or drowsiness.  Exercise encouraged, as tolerated. Encouraged proper weight management.  Important to get eight or more hours of sleep  Limiting the use of the computer and television before bedtime.  Decrease naps during the day, so night time sleep will become enhanced.  Limit caffeine , and sleep deprivation.    Avoid Allergens and Irritants Avoid secondhand smoke Avoid SICK contacts Recommend  Masking  when appropriate Recommend Keep up-to-date with vaccinations  Lets plan to use Breo as needed Plan to use albuterol  as needed

## 2023-11-07 NOTE — Progress Notes (Signed)
 @Patient  ID: Kirsten [name], female    DOB: 1955/11/21, 68 y.o.   MRN: [id number]  TEST/EVENTS :  PFT 09/20/09 >> FEV1 1.55 (57%), FEV1% 83, TLC 3.00 (54%), DLCO 58% RAST 11/02/15 >> negative IgE 10/28/20 >> 3   Sleep Tests:  PSG 07/22/08 >> AHI 80.4, SpO2 low 82% CPAP 02/22/21 to 02/27/21 >> used on 6 of 6 nights with average 7 hrs 3 min.  Average AHI 0.7 with CPAP 8 cm H2O   Cardiac Tests:  Echo 12/12/20 >> EF greater than 55%, mild LVH, grade 2 DD  SYNOPSIS 68 yo female never smoker followed for OSA and Asthma  Medical history significant for diastolic heart failure, A-fib on amiodarone  and Eliquis  and Diabetes  Hospitalization August 2023 stress-induced cardiomyopathy after severe traumatic fall with multiple left-sided rib fractures and left clavicular fracture hospital stay was complicated by hypercarbic and hypoxic respiratory failure.  Initial echo showed EF at 35 to 45%.  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  August 2024 Admitted to the hospital for left lower lobe pneumonia and sepsis Patient was discharged on oxygen  therapy Also has stress-induced cardiomyopathy  CT chest August 2024 reviewed in detail with patient + left lower lobe pneumonia Repeat CT chest August 2024 shows resolution of left lower lobe pneumonia   CC Follow up assessment for ASTHMA Follow-up assessment for OSA    HPI: Assessment of asthma No exacerbation at this time No evidence of heart failure at this time No evidence or  signs of infection at this time No respiratory distress No fevers, chills, nausea, vomiting, diarrhea No evidence of lower extremity edema No evidence hemoptysis Plan to use Breo as needed    Discussed sleep data and reviewed with patient.  Encouraged proper weight management.  Discussed driving precautions and its relationship with hypersomnolence.  Discussed sleep hygiene, and benefits of a fixed sleep waked time.  The importance of getting eight or more hours of sleep discussed with patient.  Discussed limiting the use of the computer and television before bedtime.  Decrease naps during the day, so night time sleep will become enhanced.  Limit caffeine , and sleep deprivation.   Patient uses and benefits from therapy Using CPAP nightly and with naps Pressure setting is comfortable and is sleeping well. Excellent compliance AHI will control   Allergies  Allergen Reactions   Doxycycline  Rash   Factive [Gemifloxacin Mesylate] Rash   Sulfonamide Derivatives Rash    REACTION: rash   Crestor  [Rosuvastatin ]     GI upset   Gemifloxacin Rash   Penicillins Hives and Rash    Has patient had a PCN reaction causing immediate rash, facial/tongue/throat swelling, SOB or lightheadedness with hypotension: No Has patient had a PCN reaction causing severe rash involving mucus membranes or skin necrosis: No Has patient had a PCN reaction that required hospitalization No Has patient had a PCN reaction occurring within the last 10 years: No If all of the above answers are NO, then may proceed with Cephalosporin use.     Immunization History  Administered Date(s) Administered   Influenza  Split 11/29/2010, 11/23/2011   Influenza Whole 11/12/2009   Influenza,inj,Quad PF,6+ Mos 10/22/2012, 12/31/2013, 11/02/2014, 11/28/2015, 11/23/2016, 12/25/2017, 11/03/2018, 11/16/2019, 12/20/2020, 11/01/2021   PFIZER(Purple Top)SARS-COV-2 Vaccination 04/25/2019, 05/20/2019, 02/08/2020   PNEUMOCOCCAL  CONJUGATE-20 12/20/2020   Pneumococcal Polysaccharide-23 11/23/2011, 11/16/2019   Td 11/01/2021   Tdap 08/02/2010    Past Medical History:  Diagnosis Date   A-fib [facility])    Anxiety disorder    Arthritis    Asthma    Gastroschisis    umphalocele, rotated organs abd repair until age 37   GERD (gastroesophageal reflux disease)    HTN (hypertension)    Hyperlipidemia    Hypothyroidism    IBS (irritable bowel syndrome)    Morbid obesity (HCC)    Target wt - 185  for BMI < 30   Obesity    OSA on CPAP    uses with 2L O2   Pneumonia    PONV (postoperative nausea and vomiting)    SBO (small bowel obstruction) (HCC)    Resolved with NG/Bowel rest around 2009   Sleep apnea    Type II or unspecified type diabetes mellitus without mention of complication, not stated as uncontrolled     Tobacco History: Social History   Tobacco Use  Smoking Status Never  Smokeless Tobacco Never  Tobacco Comments   tried as a teen   Counseling given: Not Answered Tobacco comments: tried as a teen   Outpatient Medications Prior to Visit  Medication Sig Dispense Refill   ALPRAZolam  (XANAX ) 0.25 MG tablet Take 1 tablet (0.25 mg total) by mouth 3 (three) times daily as needed for anxiety. 270 tablet 0   amiodarone  (PACERONE ) 200 MG tablet Take 1 tablet (200 mg total) by mouth daily. 90 tablet 0   atorvastatin  (LIPITOR) 40 MG tablet TAKE ONE TABLET BY MOUTH ONE TIME DAILY 90 tablet 1   Continuous Glucose Sensor (FREESTYLE LIBRE 3 PLUS SENSOR) MISC Apply 1 Act topically every 14 (fourteen) days. Change sensor every 15 days. 6 each 1   cyanocobalamin  (,VITAMIN B-12,) 1000 MCG/ML injection INJECT 1ML INTO THE MUSCLE ONCE A MONTH 10 mL 0   ELIQUIS  5 MG TABS tablet TAKE ONE TABLET BY MOUTH TWICE A DAY 180 tablet 1   fluticasone  (FLONASE ) 50 MCG/ACT nasal spray Place 1 spray into both nostrils daily. 18 mL 2   furosemide  (LASIX ) 20 MG tablet Take 20 mg by mouth daily.     Insulin  Pen Needle 32G X 6 MM  MISC 1 Act by Does not apply route once a week. 30 each 1   levothyroxine  (SYNTHROID ) 150 MCG tablet Take 1 tablet (150 mcg total) by mouth daily. 90 tablet 0   losartan  (COZAAR ) 25 MG tablet Take 25 mg by mouth daily.     metFORMIN  (GLUCOPHAGE -XR) 750 MG 24 hr tablet Take 1 tablet (750 mg total) by mouth daily with breakfast. 90 tablet 1   ondansetron  (ZOFRAN -ODT) 4 MG disintegrating tablet Take 1 tablet (4 mg total) by mouth every 8 (eight) hours as needed for nausea or vomiting. 12 tablet 0   OXYGEN  Inhale 2 L into the lungs at bedtime. Uses with CPAP     PARoxetine  (PAXIL -CR) 25 MG 24 hr tablet TAKE ONE TABLET BY MOUTH ONE TIME DAILY 90 tablet 1   Semaglutide  (RYBELSUS ) 3 MG TABS Take 1 tablet (3 mg total) by mouth daily. Taking with metformin  to control the blood sugar 90 tablet 0   torsemide (DEMADEX) 20 MG tablet Take 20 mg by mouth  as needed (Edema in legs).     albuterol  (PROVENTIL ) (2.5 MG/3ML) 0.083% nebulizer solution Take 3 mLs (2.5 mg total) by nebulization every 4 (four) hours as needed for wheezing or shortness of breath. 75 mL 10   albuterol  (VENTOLIN  HFA) 108 (90 Base) MCG/ACT inhaler Inhale 2 puffs into the lungs every 6 (six) hours as needed for wheezing or shortness of breath. 8 g 2   BREO ELLIPTA  100-25 MCG/ACT AEPB INHALE ONE PUFF BY MOUTH ONE TIME DAILY 60 each 0   No facility-administered medications prior to visit.   BP 120/60   Pulse 72   Temp 98.3 F (36.8 C)   Ht 5' 7 (1.702 m)   Wt 201 lb (91.2 kg)   SpO2 93%   BMI 31.48 kg/m     Review of Systems: Gen:  Denies  fever, sweats, chills weight loss  HEENT: Denies blurred vision, double vision, ear pain, eye pain, hearing loss, nose bleeds, sore throat Cardiac:  No dizziness, chest pain or heaviness, chest tightness,edema, No JVD Resp:   No cough, -sputum production, -shortness of breath,-wheezing, -hemoptysis,  Other:  All other systems negative   Physical Examination:   General Appearance: No distress   EYES PERRLA, EOM intact.   NECK Supple, No JVD Pulmonary: normal breath sounds, No wheezing.  CardiovascularNormal S1,S2.  No m/r/g.   Abdomen: Benign, Soft, non-tender. Neurology UE/LE 5/5 strength, no focal deficits Ext pulses intact, cap refill intact ALL OTHER ROS ARE NEGATIVE     Assessment & Plan:   68 year old pleasant white female seen today follow-up for asthma and follow-up for OSA  Previous diagnosis of  underlying severe sleep apnea with a diagnosis of stress-induced cardiomyopathy with EF 35% with a previous diagnosis of left lower lobe pneumonia and consolidation based on CT scan imaging status post fall clavicular fracture and broken ribs    Mild intermittent asthma Plan to use Breo as needed No exacerbation at this time No evidence of heart failure at this time No evidence or signs of infection at this time No respiratory distress No fevers, chills, nausea, vomiting, diarrhea No evidence of lower extremity edema No evidence hemoptysis Avoid Allergens and Irritants Avoid secondhand smoke Avoid SICK contacts Recommend  Masking  when appropriate Recommend Keep up-to-date with vaccinations Albuterol  as needed   Chronic hypoxic respiratory failure Intermittent use of oxygen  when extremely short of breath Patient monitors her oxygen  levels always at home    Assessment of OSA Continue CPAP as prescribed  Excellent compliance report Reviewed compliance report in detail with patient Patient definitely benefits the use of CPAP therapy as prescribed Using CPAP nightly and with naps Pressure setting is comfortable and is sleeping well. CPAP prescription 8 AHI reduced to 1  No evidence of acute heart failure at this time No respiratory distress No fevers, chills, nausea, vomiting, diarrhea No evidence hemoptysis  Patient Instructions Continue to use CPAP every night, minimum of 4-6 hours a night.  Change equipment every 30 days or as directed by DME.   Wash your tubing with warm soap and water daily, hang to dry. Wash humidifier portion weekly. Use bottled, distilled water and change daily   Be aware of reduced alertness and do not drive or operate heavy machinery if experiencing this or drowsiness.  Exercise encouraged, as tolerated. Encouraged proper weight management.  Important to get eight or more hours of sleep  Limiting the use of the computer and television before bedtime.  Decrease naps during the day, so  night time sleep will become enhanced.  Limit caffeine , and sleep deprivation.  HTN, stroke, uncontrolled diabetes and heart failure are potential risk factors.  Risk of untreated sleep apnea including cardiac arrhthymias, stroke, DM, pulm HTN.   Diabetes Mellitis - Sleep apnea can contribute to DM, therefore treatment of sleep apnea is important part of DM management.  Atrial Fibrillation - Sleep apnea can contribute to Atrial Fibrillation, therefore treatment of sleep apnea is important part of A fib management.  Stress-induced cardiomyopathy Appears euvolemic on exam.  Continue follow-up with cardiology  MEDICATION ADJUSTMENTS/LABS AND TESTS ORDERED: Avoid Allergens and Irritants Avoid secondhand smoke Avoid SICK contacts Recommend  Masking  when appropriate Recommend Keep up-to-date with vaccinations Continue CPAP as prescribed Plan to wean off Breo as tolerated  MEDICATION ADJUSTMENTS/LABS AND TESTS ORDERED:    CURRENT MEDICATIONS REVIEWED AT LENGTH WITH PATIENT TODAY   Patient  satisfied with Plan of action and management. All questions answered   Follow up 6 months   I spent a total of 42 minutes dedicated to the care of this patient on the date of this encounter to include pre-visit review of records, face-to-face time with the patient discussing conditions above, post visit ordering of testing, clinical documentation with the electronic health record, making appropriate referrals as documented, and  communicating necessary information to the patient's healthcare team.    The Patient requires high complexity decision making for assessment and support, frequent evaluation and titration of therapies, application of advanced monitoring technologies and extensive interpretation of multiple databases.  Patient satisfied with Plan of action and management. All questions answered    Nickolas Alm Cellar, M.D.  [facility]  Medical Director [facility] Medical Director [facility] Cardio-Pulmonary Department

## 2023-11-08 DIAGNOSIS — J8410 Pulmonary fibrosis, unspecified: Secondary | ICD-10-CM | POA: Diagnosis not present

## 2023-11-08 DIAGNOSIS — J9611 Chronic respiratory failure with hypoxia: Secondary | ICD-10-CM | POA: Diagnosis not present

## 2023-11-12 ENCOUNTER — Telehealth: Payer: Self-pay

## 2023-11-12 ENCOUNTER — Other Ambulatory Visit (HOSPITAL_BASED_OUTPATIENT_CLINIC_OR_DEPARTMENT_OTHER): Payer: Self-pay

## 2023-11-12 NOTE — Telephone Encounter (Signed)
 Pharmacy Patient Advocate Encounter   Received notification from CoverMyMeds that prior authorization for PARoxetine  HCl ER 25MG  er tablets  is required/requested.   Insurance verification completed.   The patient is insured through [facility] ADVANTAGE/RX ADVANCE.   Per test claim: PA required; PA submitted to above mentioned insurance via Latent Key/confirmation #/EOC [id number] Status is pending

## 2023-11-13 NOTE — Telephone Encounter (Signed)
 Prior Authorization form/request asks a question that requires your assistance. Please see the question below and advise accordingly. The PA will not be submitted until the necessary information is received.  I do not see in chart where pt has tried and failed can you please assistance.

## 2023-11-14 NOTE — Telephone Encounter (Signed)
 Yes, She has tried Xanax  0.25 and Lorazepam  0.5mg 

## 2023-11-18 ENCOUNTER — Other Ambulatory Visit: Payer: Self-pay | Admitting: Internal Medicine

## 2023-11-18 DIAGNOSIS — F3341 Major depressive disorder, recurrent, in partial remission: Secondary | ICD-10-CM | POA: Insufficient documentation

## 2023-11-18 MED ORDER — PAROXETINE HCL 20 MG PO TABS: 20.0000 mg | ORAL_TABLET | Freq: Every day | ORAL | 1 refills | Status: DC

## 2023-11-18 NOTE — Telephone Encounter (Signed)
 Please advise. Can she try one of her preferred medications?

## 2023-11-18 NOTE — Telephone Encounter (Signed)
 Pharmacy Patient Advocate Encounter  Received notification from [facility] ADVANTAGE/RX ADVANCE that Prior Authorization for PARoxetine  HCl ER 25MG  er tablets  has been DENIED.  See denial reason below. No denial letter attached in CMM. Will attach denial letter to Media tab once received.   PA #/Case ID/Reference #: [id number]  Plan requirements for the approval of a non-formulary medication are: Diagnosis of a medically-accepted indication, AND; The patient has tried and failed at least one formulary alternative, OR; The prescriber can submit a supporting

## 2023-11-19 DIAGNOSIS — G4733 Obstructive sleep apnea (adult) (pediatric): Secondary | ICD-10-CM | POA: Diagnosis not present

## 2023-11-20 DIAGNOSIS — M461 Sacroiliitis, not elsewhere classified: Secondary | ICD-10-CM | POA: Diagnosis not present

## 2023-11-25 DIAGNOSIS — M7542 Impingement syndrome of left shoulder: Secondary | ICD-10-CM | POA: Diagnosis not present

## 2023-11-25 DIAGNOSIS — M19112 Post-traumatic osteoarthritis, left shoulder: Secondary | ICD-10-CM | POA: Diagnosis not present

## 2023-11-25 DIAGNOSIS — M7582 Other shoulder lesions, left shoulder: Secondary | ICD-10-CM | POA: Diagnosis not present

## 2023-11-25 DIAGNOSIS — G8929 Other chronic pain: Secondary | ICD-10-CM | POA: Diagnosis not present

## 2023-11-26 ENCOUNTER — Encounter: Payer: Self-pay | Admitting: Podiatry

## 2023-11-26 ENCOUNTER — Ambulatory Visit

## 2023-11-26 ENCOUNTER — Ambulatory Visit (INDEPENDENT_AMBULATORY_CARE_PROVIDER_SITE_OTHER): Admitting: Podiatry

## 2023-11-26 VITALS — Ht 67.0 in | Wt 201.0 lb

## 2023-11-26 DIAGNOSIS — M722 Plantar fascial fibromatosis: Secondary | ICD-10-CM

## 2023-11-26 MED ORDER — METHYLPREDNISOLONE 4 MG PO TBPK: ORAL_TABLET | ORAL | 0 refills | Status: DC

## 2023-12-02 NOTE — Progress Notes (Signed)
 Chief Complaint  Patient presents with   Foot Pain    Pt is here due to right foot pain states the pain is at the arch of the foot, states she has received 2 injection into the foot so far, also complains about left great, right 2 and 3rd toenails are cracking.    Subjective: 68 y.o. female presenting for above complaint   Past Medical History:  Diagnosis Date   A-fib (HCC)    Anxiety disorder    Arthritis    Asthma    Gastroschisis    umphalocele, rotated organs abd repair until age 81   GERD (gastroesophageal reflux disease)    HTN (hypertension)    Hyperlipidemia    Hypothyroidism    IBS (irritable bowel syndrome)    Morbid obesity (HCC)    Target wt - 185  for BMI < 30   Obesity    OSA on CPAP    uses with 2L O2   Pneumonia    PONV (postoperative nausea and vomiting)    SBO (small bowel obstruction) (HCC)    Resolved with NG/Bowel rest around 2009   Sleep apnea    Type II or unspecified type diabetes mellitus without mention of complication, not stated as uncontrolled     Past Surgical History:  Procedure Laterality Date   BREAST EXCISIONAL BIOPSY Left 03/19/2012   neg   CESAREAN SECTION     CHOLECYSTECTOMY  1992   COLONOSCOPY  2011   2011-normal   ESOPHAGOGASTRODUODENOSCOPY N/A 10/10/2023   Procedure: EGD (ESOPHAGOGASTRODUODENOSCOPY);  Surgeon: Stacia Glendia BRAVO, MD;  Location: [facility] ENDOSCOPY;  Service: Gastroenterology;  Laterality: N/A;   Newborn Surgery - GI - ORGANS OUTSIDE ABDOMEN     RIGHT/LEFT HEART CATH AND CORONARY ANGIOGRAPHY N/A 10/02/2021   Procedure: RIGHT/LEFT HEART CATH AND CORONARY ANGIOGRAPHY;  Surgeon: Ammon Blunt, MD;  Location: [facility] INVASIVE CV LAB;  Service: Cardiovascular;  Laterality: N/A;   SAVORY DILATION N/A 10/10/2023   Procedure: EGD, WITH DILATION USING SAVARY-GILLIARD DILATOR OVER GUIDEWIRE;  Surgeon: Stacia Glendia BRAVO, MD;  Location: [facility] ENDOSCOPY;  Service: Gastroenterology;  Laterality: N/A;   Small Bowel Repair      TUBAL LIGATION  1988    Objective: Physical Exam General: The patient is alert and oriented x3 in no acute distress.  Dermatology: Skin is warm, dry and supple bilateral lower extremities. Negative for open lesions or macerations bilateral.   Vascular: Dorsalis Pedis and Posterior Tibial pulses palpable bilateral.  Capillary fill time is immediate to all digits.  Neurological: Grossly intact via light touch  Musculoskeletal: Tenderness to palpation to the plantar aspect of the right heel along the plantar fascia. All other joints range of motion within normal limits bilateral. Strength 5/5 in all groups bilateral.   Radiographic exam RT foot 11/26/2023: Normal osseous mineralization. Joint spaces preserved. No fracture/dislocation/boney destruction. No other soft tissue abnormalities or radiopaque foreign bodies.  Plantar and posterior heel spur noted on lateral view  Assessment: 1. Plantar fasciitis right 2.  Plantar and posterior heel spur right  Plan of Care:  -Patient evaluated. Xrays reviewed.   -Prescription for Medrol  Dosepak - On anticoagulant.  No NSAIDs -Advised against going barefoot.  Recommended supportive tennis shoes and sneakers -Recommend daily calf stretching, specifically calf stretches to alleviate posterior calf tightness -Return to clinic 4 weeks   Thresa EMERSON Sar, DPM [facility]  Dr. Thresa EMERSON Sar, DPM    [address]  [place], [place] [address]                Office [phone]  Fax [phone]

## 2023-12-08 DIAGNOSIS — J9611 Chronic respiratory failure with hypoxia: Secondary | ICD-10-CM | POA: Diagnosis not present

## 2023-12-10 ENCOUNTER — Other Ambulatory Visit: Payer: Self-pay | Admitting: Internal Medicine

## 2023-12-10 DIAGNOSIS — E118 Type 2 diabetes mellitus with unspecified complications: Secondary | ICD-10-CM

## 2023-12-15 ENCOUNTER — Other Ambulatory Visit: Payer: Self-pay

## 2023-12-15 NOTE — Progress Notes (Signed)
 Contacted patient to discuss potential medication access barriers related to use of non-preferred pharmacy. Discussed health plan preferred pharmacies.  Patient has transitioned to [facility] in [place], [place], which is a preferred pharmacy. No further action required at this time.   Woodie Jock, PharmD PGY1 Pharmacy Resident  12/15/2023

## 2023-12-17 ENCOUNTER — Ambulatory Visit (INDEPENDENT_AMBULATORY_CARE_PROVIDER_SITE_OTHER): Admitting: Internal Medicine

## 2023-12-17 ENCOUNTER — Encounter: Payer: Self-pay | Admitting: Internal Medicine

## 2023-12-17 VITALS — BP 142/86 | HR 75 | Temp 97.8°F | Ht 67.0 in | Wt 200.0 lb

## 2023-12-17 DIAGNOSIS — E1165 Type 2 diabetes mellitus with hyperglycemia: Secondary | ICD-10-CM

## 2023-12-17 DIAGNOSIS — I480 Paroxysmal atrial fibrillation: Secondary | ICD-10-CM | POA: Diagnosis not present

## 2023-12-17 DIAGNOSIS — D510 Vitamin B12 deficiency anemia due to intrinsic factor deficiency: Secondary | ICD-10-CM | POA: Diagnosis not present

## 2023-12-17 DIAGNOSIS — Z794 Long term (current) use of insulin: Secondary | ICD-10-CM

## 2023-12-17 DIAGNOSIS — I10 Essential (primary) hypertension: Secondary | ICD-10-CM | POA: Diagnosis not present

## 2023-12-17 DIAGNOSIS — E118 Type 2 diabetes mellitus with unspecified complications: Secondary | ICD-10-CM

## 2023-12-17 DIAGNOSIS — K7581 Nonalcoholic steatohepatitis (NASH): Secondary | ICD-10-CM

## 2023-12-17 DIAGNOSIS — E039 Hypothyroidism, unspecified: Secondary | ICD-10-CM | POA: Diagnosis not present

## 2023-12-17 LAB — BASIC METABOLIC PANEL WITH GFR
BUN: 17 mg/dL (ref 6–23)
CO2: 33 meq/L — ABNORMAL HIGH (ref 19–32)
Calcium: 10 mg/dL (ref 8.4–10.5)
Chloride: 100 meq/L (ref 96–112)
Creatinine, Ser: 0.92 mg/dL (ref 0.40–1.20)
GFR: 64.95 mL/min (ref >60.00)
Glucose, Bld: 172 mg/dL — ABNORMAL HIGH (ref 70–99)
Potassium: 3.8 meq/L (ref 3.5–5.1)
Sodium: 142 meq/L (ref 135–145)

## 2023-12-17 LAB — CBC WITH DIFFERENTIAL/PLATELET
Basophils Absolute: 0 K/uL (ref 0.0–0.1)
Basophils Relative: 0.4 % (ref 0.0–3.0)
Eosinophils Absolute: 0.2 K/uL (ref 0.0–0.7)
Eosinophils Relative: 2.6 % (ref 0.0–5.0)
HCT: 40.5 % (ref 36.0–46.0)
Hemoglobin: 13.3 g/dL (ref 12.0–15.0)
Lymphocytes Relative: 43.5 % (ref 12.0–46.0)
Lymphs Abs: 3.8 K/uL (ref 0.7–4.0)
MCHC: 32.9 g/dL (ref 30.0–36.0)
MCV: 88.8 fl (ref 78.0–100.0)
Monocytes Absolute: 0.5 K/uL (ref 0.1–1.0)
Monocytes Relative: 5.8 % (ref 3.0–12.0)
Neutro Abs: 4.2 K/uL (ref 1.4–7.7)
Neutrophils Relative %: 47.7 % (ref 43.0–77.0)
Platelets: 256 K/uL (ref 150.0–400.0)
RBC: 4.57 Mil/uL (ref 3.87–5.11)
RDW: 13.8 % (ref 11.5–15.5)
WBC: 8.8 K/uL (ref 4.0–10.5)

## 2023-12-17 LAB — URINALYSIS, ROUTINE W REFLEX MICROSCOPIC
Bilirubin Urine: NEGATIVE
Hgb urine dipstick: NEGATIVE
Ketones, ur: NEGATIVE
Nitrite: NEGATIVE
RBC / HPF: NONE SEEN (ref 0–?)
Specific Gravity, Urine: 1.02 (ref 1.000–1.030)
Total Protein, Urine: NEGATIVE
Urine Glucose: NEGATIVE
Urobilinogen, UA: 0.2 (ref 0.0–1.0)
pH: 5.5 (ref 5.0–8.0)

## 2023-12-17 LAB — MICROALBUMIN / CREATININE URINE RATIO
Creatinine,U: 68.9 mg/dL
Microalb Creat Ratio: 19.2 mg/g (ref 0.0–30.0)
Microalb, Ur: 1.3 mg/dL (ref 0.0–1.9)

## 2023-12-17 LAB — HEMOGLOBIN A1C: Hgb A1c MFr Bld: 7.9 % — ABNORMAL HIGH (ref 4.6–6.5)

## 2023-12-17 LAB — PROTIME-INR
INR: 1.4 ratio — ABNORMAL HIGH (ref 0.8–1.0)
Prothrombin Time: 14.4 s — ABNORMAL HIGH (ref 9.6–13.1)

## 2023-12-17 MED ORDER — BLOOD GLUCOSE MONITORING SUPPL DEVI: 1.0000 | 0 refills | Status: AC

## 2023-12-17 MED ORDER — BLOOD GLUCOSE TEST VI STRP: 1.0000 | ORAL_STRIP | 0 refills | Status: AC

## 2023-12-17 MED ORDER — LANCET DEVICE MISC: 1.0000 | 0 refills | Status: AC

## 2023-12-17 MED ORDER — LANCETS MISC: 1.0000 | 0 refills | Status: AC

## 2023-12-17 MED ORDER — CYANOCOBALAMIN 1000 MCG/ML IJ SOLN: INTRAMUSCULAR | 0 refills | Status: AC

## 2023-12-17 NOTE — Patient Instructions (Signed)

## 2023-12-17 NOTE — Progress Notes (Signed)
 Subjective:  Patient ID: Kirsten Kirsten [name], female    DOB: October 11, 1955  Age: 68 y.o. MRN: [id number]  CC: Hypertension, Hypothyroidism, Kirsten [name], and Atrial Fibrillation   HPI Kirsten Kirsten [name] presents for f/up -  Discussed the use of AI scribe software for clinical note transcription with the patient, who gave verbal consent to proceed.  History of Present Illness Kirsten Kirsten [name] is a 68 year old female with Kirsten [name] who presents with elevated blood sugar levels.  She has elevated blood sugar levels, with a recent reading of 285 mg/dL. She is currently taking metformin  for blood sugar control. She previously tried Rybelsus , which initially helped her blood sugar levels, but she experienced significant side effects, including vomiting, when the dose was increased to 8 mg. Her insurance denied coverage for the 3 mg dose, which she tolerated better.  No excessive thirst, excessive urination, or changes in weight or appetite. No symptoms related to her thyroid , such as feeling jittery, nervous, or having trouble sleeping. Her bowel movements are normal, without constipation or diarrhea.  She has a history of hospitalization due to reactions to the COVID vaccine, but she has also had a COVID infection that required hospitalization. She does not receive flu or COVID vaccines.  No chest pain, shortness of breath, dizziness, lightheadedness, or palpitations. Her last EKG was in January of the previous year. She remains physically active and reports no recent swelling in her legs, although she did sustain a bruise from hitting her leg on a picnic table.     Outpatient Medications Prior to Visit  Medication Sig Dispense Refill   albuterol  (VENTOLIN  HFA) 108 (90 Base) MCG/ACT inhaler Inhale 2 puffs into the lungs every 6 (six) hours as needed for wheezing or shortness of breath. 8 g 11   ALPRAZolam  (XANAX ) 0.25 MG tablet Take 1 tablet (0.25 mg total) by mouth 3 (three) times daily as needed for  anxiety. 270 tablet 0   amiodarone  (PACERONE ) 200 MG tablet Take 1 tablet (200 mg total) by mouth daily. 90 tablet 0   atorvastatin  (LIPITOR) 40 MG tablet TAKE ONE TABLET BY MOUTH ONE TIME DAILY 90 tablet 1   Continuous Glucose Sensor (FREESTYLE LIBRE 3 PLUS SENSOR) MISC Apply 1 Act topically every 14 (fourteen) days. Change sensor every 15 days. 6 each 1   ELIQUIS  5 MG TABS tablet TAKE ONE TABLET BY MOUTH TWICE A DAY 180 tablet 1   fluticasone  (FLONASE ) 50 MCG/ACT nasal spray Place 1 spray into both nostrils daily. 18 mL 2   fluticasone  furoate-vilanterol (BREO ELLIPTA ) 100-25 MCG/ACT AEPB Inhale 1 puff into the lungs daily. 60 each 10   furosemide  (LASIX ) 20 MG tablet Take 20 mg by mouth daily.     levothyroxine  (SYNTHROID ) 150 MCG tablet Take 1 tablet (150 mcg total) by mouth daily. 90 tablet 0   losartan  (COZAAR ) 25 MG tablet Take 25 mg by mouth daily.     metFORMIN  (GLUCOPHAGE -XR) 750 MG 24 hr tablet TAKE ONE TABLET BY MOUTH ONE TIME DAILY WITH BREAKFAST 90 tablet 1   OXYGEN  Inhale 2 L into the lungs at bedtime. Uses with CPAP     PARoxetine  (PAXIL ) 20 MG tablet Take 1 tablet (20 mg total) by mouth daily. 90 tablet 1   torsemide (DEMADEX) 20 MG tablet Take 20 mg by mouth as needed (Edema in legs).     cyanocobalamin  (,VITAMIN B-12,) 1000 MCG/ML injection INJECT 1ML INTO THE MUSCLE ONCE A MONTH 10 mL 0   Insulin  Pen  Needle 32G X 6 MM MISC 1 Act by Does not apply route once a week. 30 each 1   ondansetron  (ZOFRAN -ODT) 4 MG disintegrating tablet Take 1 tablet (4 mg total) by mouth every 8 (eight) hours as needed for nausea or vomiting. 12 tablet 0   methylPREDNISolone  (MEDROL  DOSEPAK) 4 MG TBPK tablet 6 day dose pack - take as directed 21 tablet 0   Semaglutide  (RYBELSUS ) 3 MG TABS Take 1 tablet (3 mg total) by mouth daily. Taking with metformin  to control the blood sugar (Patient not taking: Reported on 12/17/2023) 90 tablet 0   No facility-administered medications prior to visit.     ROS Review of Systems  Constitutional:  Negative for appetite change, chills, diaphoresis, fatigue, fever and unexpected weight change.  HENT: Negative.    Eyes: Negative.   Respiratory:  Positive for apnea. Negative for cough, shortness of breath and wheezing.   Cardiovascular:  Negative for chest pain, palpitations and leg swelling.  Gastrointestinal:  Negative for abdominal pain, blood in stool, constipation, diarrhea and vomiting.  Endocrine: Negative.  Negative for polydipsia, polyphagia and polyuria.  Genitourinary: Negative.  Negative for difficulty urinating, dysuria and hematuria.  Musculoskeletal:  Positive for arthralgias. Negative for myalgias.  Skin: Negative.  Negative for color change and rash.  Neurological:  Negative for dizziness, weakness and light-headedness.  Hematological:  Negative for adenopathy. Does not bruise/bleed easily.  Psychiatric/Behavioral: Negative.      Objective:  BP (!) 142/86   Pulse 75   Temp 97.8 F (36.6 C) (Temporal)   Ht 5' 7 (1.702 m)   Wt 200 lb (90.7 kg)   SpO2 95%   BMI 31.32 kg/m   BP Readings from Last 3 Encounters:  12/17/23 (!) 142/86  11/07/23 120/60  10/10/23 (!) 146/71    Wt Readings from Last 3 Encounters:  12/17/23 200 lb (90.7 kg)  11/26/23 201 lb (91.2 kg)  11/07/23 201 lb (91.2 kg)    Physical Exam Vitals reviewed.  Constitutional:      Appearance: Normal appearance.  HENT:     Nose: Nose normal.     Mouth/Throat:     Mouth: Mucous membranes are moist.  Eyes:     General: No scleral icterus.    Conjunctiva/sclera: Conjunctivae normal.  Cardiovascular:     Rate and Rhythm: Normal rate and regular rhythm.     Heart sounds: No murmur heard.    No friction rub. No gallop.     Comments: EKG--- NSR, 74 bpm LAD LVH with wide QRS unchanged  Pulmonary:     Effort: Pulmonary effort is normal.     Breath sounds: No stridor. No wheezing, rhonchi or rales.  Abdominal:     General: Abdomen is flat.  Bowel sounds are normal.     Palpations: There is no mass.     Tenderness: There is no abdominal tenderness. There is no guarding.     Hernia: No hernia is present.  Musculoskeletal:     Cervical back: Neck supple.     Right lower leg: No edema.     Left lower leg: No edema.  Lymphadenopathy:     Cervical: No cervical adenopathy.  Skin:    General: Skin is warm and dry.  Neurological:     General: No focal deficit present.     Mental Status: She is alert.  Psychiatric:        Mood and Affect: Mood normal.        Behavior: Behavior normal.  Lab Results  Component Value Date   WBC 8.8 12/17/2023   HGB 13.3 12/17/2023   HCT 40.5 12/17/2023   PLT 256.0 12/17/2023   GLUCOSE 172 (H) 12/17/2023   CHOL 154 03/06/2023   TRIG 183.0 (H) 03/06/2023   HDL 54.70 03/06/2023   LDLDIRECT 86.0 09/04/2022   LDLCALC 63 03/06/2023   ALT 35 07/25/2023   AST 27 07/25/2023   NA 142 12/17/2023   K 3.8 12/17/2023   CL 100 12/17/2023   CREATININE 0.92 12/17/2023   BUN 17 12/17/2023   CO2 33 (H) 12/17/2023   TSH 4.61 12/17/2023   INR 1.4 (H) 12/17/2023   HGBA1C 7.9 (H) 12/17/2023   MICROALBUR 1.3 12/17/2023    Fibrosis 4 Score = 1.18  Fib-4 interpretation is not validated for people under 35 or over 84 years of age. However, scores under 2.0 are generally considered low risk.   Assessment & Plan:  Type 2 Kirsten [name] mellitus with hyperglycemia, without long-term current use of insulin  (HCC) -     Hemoglobin A1c; Future -     Microalbumin / creatinine urine ratio; Future -     Urinalysis, Routine w reflex microscopic; Future -     Blood Glucose Monitoring Suppl; 1 each by Does not apply route as directed. Dispense based on patient and insurance preference. Use up to four times daily as directed. (FOR ICD-10 E10.9, E11.9).  Dispense: 1 each; Refill: 0 -     Blood Glucose Test; 1 each by Does not apply route as directed. Dispense based on patient and insurance preference. Use up to four  times daily as directed. (FOR ICD-10 E10.9, E11.9).  Dispense: 100 strip; Refill: 0 -     Lancet Device; 1 each by Does not apply route as directed. Dispense based on patient and insurance preference. Use up to four times daily as directed. (FOR ICD-10 E10.9, E11.9).  Dispense: 1 each; Refill: 0 -     Lancets; 1 each by Does not apply route as directed. Dispense based on patient and insurance preference. Use up to four times daily as directed. (FOR ICD-10 E10.9, E11.9).  Dispense: 100 each; Refill: 0  NASH (nonalcoholic steatohepatitis) -     Protime-INR; Future  Acquired hypothyroidism- She is euthyroid. -     TSH; Future  Essential hypertension, benign- BP is well controlled. -     EKG 12-Lead -     Basic metabolic panel with GFR; Future -     CBC with Differential/Platelet; Future -     Urinalysis, Routine w reflex microscopic; Future  Paroxysmal atrial fibrillation (HCC)- She has good R/R control. -     EKG 12-Lead  Vitamin B12 deficiency anemia due to intrinsic factor deficiency -     Cyanocobalamin ; INJECT INTO THE MUSCLE ONCE A MONTH  Dispense: 10 mL; Refill: 0  Kirsten [name] mellitus treated with insulin  (HCC) -     [place] FlexTouch; Inject 20 Units into the skin daily.  Dispense: 9 mL; Refill: 1 -     Insulin  Pen Needle; 1 Act by Does not apply route once a week.  Dispense: 100 each; Refill: 1      Follow-up: Return in about 4 months (around 04/15/2024).  Kirsten Molt, MD

## 2023-12-18 ENCOUNTER — Ambulatory Visit: Payer: Self-pay | Admitting: Internal Medicine

## 2023-12-18 DIAGNOSIS — Z794 Long term (current) use of insulin: Secondary | ICD-10-CM | POA: Insufficient documentation

## 2023-12-18 LAB — TSH: TSH: 4.61 u[IU]/mL (ref 0.35–5.50)

## 2023-12-18 MED ORDER — TRESIBA FLEXTOUCH 200 UNIT/ML ~~LOC~~ SOPN: 20.0000 [IU] | PEN_INJECTOR | SUBCUTANEOUS | 1 refills | Status: AC

## 2023-12-18 MED ORDER — INSULIN PEN NEEDLE 32G X 6 MM MISC: 1.0000 | 1 refills | Status: AC

## 2023-12-20 ENCOUNTER — Other Ambulatory Visit: Payer: Self-pay | Admitting: Internal Medicine

## 2023-12-20 ENCOUNTER — Telehealth: Payer: Self-pay

## 2023-12-20 DIAGNOSIS — E039 Hypothyroidism, unspecified: Secondary | ICD-10-CM

## 2023-12-20 DIAGNOSIS — E119 Type 2 diabetes mellitus without complications: Secondary | ICD-10-CM

## 2023-12-20 MED ORDER — ONETOUCH VERIO FLEX SYSTEM W/DEVICE KIT: 1.0000 | PACK | Freq: Three times a day (TID) | 2 refills | Status: AC

## 2023-12-20 NOTE — Telephone Encounter (Signed)
 Copied from CRM [id number]. Topic: Clinical - Medication Question >> Dec 20, 2023 10:13 AM China J wrote: Reason for CRM: The patient was instructed to take insulin  but is needing a bit more clarification on directions for this medication and would like if someone could call and help her out with that. Also, she wanted to ask if she should discontinue metformin .  She was also wondering if Dr. Joshua could send in OneTouch's Glucose Meter & Strips and explained how she prefers to use OneTouch.  Please call patient at [phone] as she is aware of the same day turn around time.

## 2023-12-24 ENCOUNTER — Ambulatory Visit: Admitting: Podiatry

## 2023-12-24 ENCOUNTER — Telehealth: Payer: Self-pay

## 2023-12-24 NOTE — Telephone Encounter (Signed)
**Note De-identified  [place] Obfuscation** Please advise 

## 2023-12-24 NOTE — Telephone Encounter (Signed)
 Copied from CRM [id number]. Topic: Clinical - Medication Question >> Dec 24, 2023  9:14 AM Kirsten [name] wrote: Reason for CRM: Patient would like know if she should stop metformin  when she starts the new insulin  tresiba? She would also like to know if the tresiba is a Glp medication? Please advise.

## 2023-12-25 NOTE — Telephone Encounter (Signed)
 This is not a GLP-1

## 2023-12-30 ENCOUNTER — Other Ambulatory Visit: Payer: Self-pay | Admitting: Internal Medicine

## 2023-12-30 DIAGNOSIS — E1165 Type 2 diabetes mellitus with hyperglycemia: Secondary | ICD-10-CM

## 2023-12-30 MED ORDER — OZEMPIC (0.25 OR 0.5 MG/DOSE) 2 MG/3ML ~~LOC~~ SOPN: 0.2500 mg | PEN_INJECTOR | SUBCUTANEOUS | 0 refills | Status: AC

## 2023-12-30 NOTE — Telephone Encounter (Signed)
 Patient has been made aware and gave a verbal understanding.

## 2023-12-30 NOTE — Telephone Encounter (Signed)
 Rx Sent

## 2023-12-30 NOTE — Telephone Encounter (Signed)
 Patient states that she prefers to take the GLP medication and she is okay with it being a injection.

## 2024-01-03 DIAGNOSIS — E1165 Type 2 diabetes mellitus with hyperglycemia: Secondary | ICD-10-CM | POA: Diagnosis not present

## 2024-01-03 DIAGNOSIS — E669 Obesity, unspecified: Secondary | ICD-10-CM | POA: Diagnosis not present

## 2024-01-03 DIAGNOSIS — Z794 Long term (current) use of insulin: Secondary | ICD-10-CM | POA: Diagnosis not present

## 2024-01-03 DIAGNOSIS — E1159 Type 2 diabetes mellitus with other circulatory complications: Secondary | ICD-10-CM | POA: Diagnosis not present

## 2024-01-03 DIAGNOSIS — E119 Type 2 diabetes mellitus without complications: Secondary | ICD-10-CM | POA: Diagnosis not present

## 2024-01-03 NOTE — Progress Notes (Signed)
 Chief complaint: Diabetes  History of present illness: Kirsten [name] is 68 y.o. female seen in follow up. She was last seen in 01/2022 for osteoporosis (declined treatment at that time) and is here today to discuss her diabetes.  Historically, her Hb A1c has ranged between 6.0 - 7.4% over the last few years, with her last Hb A1c at 7.9% on 12/17/2023. She checks her blood sugars with a Libre 3+ sensor (uses phone as receiver). Data was downloaded and reviewed. Over the last 14 days, her average sugar was 194 mg/dl. She is in range 44% and above 56%. No hypoglycemia. Pattern shows sugars are relatively stable and remain in the upper 100's. Post-prandial hyperglycemia resolves after 2-3 hours. Diabetes is uncontrolled. She is currently taking for her diabetes: metformin  750 mg QHS, and Tresiba  20 units QAM. She was prescribed Ozempic , but has not started this yet. Review of her diet shows she avoids all sugary drinks and tries to limit concentrated carbs/starches (her son has T1DM, so she is very familiar with dietary guidelines). Diabetes is complicated by acute on chronic heart failure and Afib (follows with Dr. Ammon). She has co morbidities of: NASH, hypertension, and obesity. BP is elevated in office today, but normal on prior readings. She had an eye exam this year at [facility]. Denies any h/o retinopathy.   Previously tried medications for diabetes: Rybelsus  - tolerated 3 mg dose and it was very effective for her, but could not tolerate 7 mg dose. Insurance would not cover 3 mg dose as maintenance, so she had to discontinue this.    No past medical history on file.  Outpatient Medications Marked as Taking for the 01/03/24 encounter (Office Visit) with Brendia Calton Squires, PA  Medication Sig Dispense Refill  . ACCU-CHEK SOFTCLIX LANCETS lancets     . acetaminophen  (TYLENOL ) 325 MG tablet Take 650 mg by mouth as needed    . albuterol  90 mcg/actuation inhaler Inhale 2  inhalations into the lungs every 6 (six) hours as needed    . ALPRAZolam  (XANAX ) 0.25 MG tablet Take 0.25 mg by mouth at bedtime as needed    . AMIOdarone  (PACERONE ) 200 MG tablet Take 0.5 tablets (100 mg total) by mouth once daily 100 tablet 1  . atorvastatin  (LIPITOR) 40 MG tablet Take 1 tablet by mouth once daily    . blood-glucose meter Misc 1 each    . BREO ELLIPTA  100-25 mcg/dose DsDv inhaler     . cyanocobalamin  (VITAMIN B12) 1,000 mcg/mL injection INJECT 1ML INTO THE MUSCLE ONCE A MONTH    . ELIQUIS  5 mg tablet Take 1 tablet (5 mg total) by mouth 2 (two) times daily 100 tablet 1  . FREESTYLE LIBRE 3 SENSOR Devi PLACE ONE SENSOR TO THE BACK OF YOUR UPPER ARM. REPLACE EVERY 14 DAYS.    . FUROsemide  (LASIX ) 20 MG tablet Take 1 tablet (20 mg total) by mouth once daily 100 tablet 1  . gabapentin  (NEURONTIN ) 100 MG capsule Take by mouth 200 mg in the am and 300 mg in the evening    . levothyroxine  (SYNTHROID ) 150 MCG tablet Take 150 mcg by mouth once daily    . losartan  (COZAAR ) 25 MG tablet Take 1 tablet (25 mg total) by mouth once daily 100 tablet 1  . metFORMIN  (GLUCOPHAGE -XR) 750 MG XR tablet Take 750 mg by mouth daily with dinner    . metoprolol  SUCCinate (TOPROL -XL) 25 MG XL tablet Take 1 tablet (25 mg total) by mouth once daily 100  tablet 1  . OZEMPIC  0.25 mg or 0.5 mg (2 mg/3 mL) pen injector Inject 0.25 mg subcutaneously every 7 (seven) days    . PARoxetine  (PAXIL -CR) 25 MG CR tablet Take 1 tablet by mouth once daily    . RELION ULTIMA test strip 1 each    . TECHLITE PEN NEEDLE 32 gauge x 1/4 needle     . TORsemide  (DEMADEX ) 20 MG tablet Take 1 tablet (20 mg total) by mouth once daily as needed (for swelling in the legs) 100 tablet 1  . TRESIBA  FLEXTOUCH U-200 pen injector (concentration 200 units/mL) Inject 20 Units subcutaneously once daily      Exam: BP (!) 152/94   Pulse 79   Ht 170.2 cm (5' 7)   Wt 93.4 kg (206 lb)   SpO2 95%   BMI 32.26 kg/m  GEN: well developed,  well nourished, in NAD. HEENT: No proptosis. EOMI. No lid lag or stare.  SKIN: no dermatopathy or rash. A bilateral bare foot exam shows no sores or calluses. Toenails are trimmed and well kempt. NEURO: PERRL  PSYC: alert and oriented, good insight   Labs (12/17/2023) Hemoglobin A1c - External Order: [id number] Component Ref Range & Units 2 wk ago  Hgb A1c MFr Bld 4.6 - 6.5 % 7.9 High    Basic metabolic panel with GFR Order: [id number] Component Ref Range & Units 2 wk ago  Sodium 135 - 145 mEq/L 142  Potassium 3.5 - 5.1 mEq/L 3.8  Chloride 96 - 112 mEq/L 100  CO2 19 - 32 mEq/L 33 High   Comment: Elevated LDH levels may cause falsely increased CO2 results. If LDH is >2000 U/L, a positive bias of 12% is possible.  Glucose, Bld 70 - 99 mg/dL 827 High   BUN 6 - 23 mg/dL 17  Creatinine, Ser 9.59 - 1.20 mg/dL 9.07  GFR >39.99 mL/min 64.95  Comment: Calculated using the CKD-EPI Creatinine Equation (2021)  Calcium  8.4 - 10.5 mg/dL 89.9   Microalbumin / creatinine urine ratio Order: [id number] Component Ref Range & Units 2 wk ago  Microalb, Ur 0.0 - 1.9 mg/dL 1.3  Creatinine,U mg/dL 31.0  Microalb Creat Ratio 0.0 - 30.0 mg/g 19.2   TSH Order: [id number] Component Ref Range & Units 2 wk ago  TSH 0.35 - 5.50 uIU/mL 4.61    Assessment: 1. Type 2 diabetes mellitus with hyperglycemia, with long-term current use of insulin  (CMS/HHS-HCC)   2. Type 2 diabetes mellitus with other circulatory complication, with long-term current use of insulin  (CMS/HHS-HCC)   3. Type 2 diabetes mellitus in patient with obesity (CMS/HHS-HCC)     Plan: - We discussed diabetes, its complications, and goals of care. Patient was advised of the importance of diet and behavioral changes to improve glycemic control. Patient was advised of the importance of a low carbohydrate diet. Patient was advised of the importance of achieving and maintaining a healthy body weight. Patient was advised of the  importance of regular monitoring of blood sugars.   - Diabetes is uncontrolled and Hb A1c is not at target. Her target Hb A1c is <7.0%. Discussed that GLP use would be ideal in her case due to h/o NASH and benefits of better glycemic control and weight loss that comes with this class of drugs. Recommended she start Ozempic  0.25 mg weekly dose, and remain on this dose x 8 weeks to very slowly titrate her dose (since Rybelsus  7 mg tabs were intolerable). Discussed side effects including nausea and dicusssed strategies to  reduce nausea. She was instructed on use and storage of the Ozempic  pens. If she does well after 8 weeks, then she can titrate to 0.5 mg weekly dosing.  - Since sugars are persistently elevated, will have her continue metformin  750 mg/d and Tresiba  to 20 units QD for now. Ok to reduce dose of Tresiba  to 15 units if she has any low sugars. Written instructions provided. -  Instructed to monitor blood sugars ad lib with Libre 3+ sensors. She is compliant with its use and benefits from CGM due to insulin  requirement. Reminded to bring blood sugar log and/or meter/phone to every visit for review.  - Continue annual eye exams. She is up to date on this.  - Continue follow up with other specialties.  - Discussed foot hygiene and importance of daily foot inspections.  -  Follow up in 2 months, or sooner should any other issues arise, to recheck A1c. Advised she notify our office of any issues before then.    Attestation Statement:   I personally performed the service, non-incident to. (WP)   CASSANDRA BUEL COHN, PA

## 2024-01-04 ENCOUNTER — Inpatient Hospital Stay
Admission: EM | Admit: 2024-01-04 | Discharge: 2024-01-07 | DRG: 206 | Disposition: A | Discharge status: Home / Self Care | Attending: Internal Medicine | Admitting: Internal Medicine

## 2024-01-04 ENCOUNTER — Other Ambulatory Visit: Payer: Self-pay

## 2024-01-04 ENCOUNTER — Emergency Department

## 2024-01-04 ENCOUNTER — Observation Stay

## 2024-01-04 DIAGNOSIS — Z794 Long term (current) use of insulin: Secondary | ICD-10-CM

## 2024-01-04 DIAGNOSIS — Z807 Family history of other malignant neoplasms of lymphoid, hematopoietic and related tissues: Secondary | ICD-10-CM

## 2024-01-04 DIAGNOSIS — K219 Gastro-esophageal reflux disease without esophagitis: Secondary | ICD-10-CM | POA: Diagnosis present

## 2024-01-04 DIAGNOSIS — M47812 Spondylosis without myelopathy or radiculopathy, cervical region: Secondary | ICD-10-CM

## 2024-01-04 DIAGNOSIS — W19XXXA Unspecified fall, initial encounter: Secondary | ICD-10-CM | POA: Diagnosis not present

## 2024-01-04 DIAGNOSIS — F411 Generalized anxiety disorder: Secondary | ICD-10-CM | POA: Diagnosis present

## 2024-01-04 DIAGNOSIS — F419 Anxiety disorder, unspecified: Secondary | ICD-10-CM | POA: Diagnosis present

## 2024-01-04 DIAGNOSIS — Z7984 Long term (current) use of oral hypoglycemic drugs: Secondary | ICD-10-CM

## 2024-01-04 DIAGNOSIS — S0003XA Contusion of scalp, initial encounter: Secondary | ICD-10-CM | POA: Diagnosis not present

## 2024-01-04 DIAGNOSIS — M542 Cervicalgia: Secondary | ICD-10-CM | POA: Diagnosis present

## 2024-01-04 DIAGNOSIS — I480 Paroxysmal atrial fibrillation: Secondary | ICD-10-CM | POA: Diagnosis not present

## 2024-01-04 DIAGNOSIS — R0902 Hypoxemia: Secondary | ICD-10-CM | POA: Diagnosis not present

## 2024-01-04 DIAGNOSIS — G4700 Insomnia, unspecified: Secondary | ICD-10-CM | POA: Diagnosis present

## 2024-01-04 DIAGNOSIS — Z880 Allergy status to penicillin: Secondary | ICD-10-CM

## 2024-01-04 DIAGNOSIS — Z833 Family history of diabetes mellitus: Secondary | ICD-10-CM

## 2024-01-04 DIAGNOSIS — E785 Hyperlipidemia, unspecified: Secondary | ICD-10-CM | POA: Diagnosis present

## 2024-01-04 DIAGNOSIS — M62838 Other muscle spasm: Secondary | ICD-10-CM | POA: Diagnosis present

## 2024-01-04 DIAGNOSIS — Z79899 Other long term (current) drug therapy: Secondary | ICD-10-CM

## 2024-01-04 DIAGNOSIS — I10 Essential (primary) hypertension: Secondary | ICD-10-CM | POA: Diagnosis present

## 2024-01-04 DIAGNOSIS — R55 Syncope and collapse: Secondary | ICD-10-CM | POA: Diagnosis present

## 2024-01-04 DIAGNOSIS — I672 Cerebral atherosclerosis: Secondary | ICD-10-CM | POA: Diagnosis not present

## 2024-01-04 DIAGNOSIS — Z882 Allergy status to sulfonamides status: Secondary | ICD-10-CM

## 2024-01-04 DIAGNOSIS — Z881 Allergy status to other antibiotic agents status: Secondary | ICD-10-CM

## 2024-01-04 DIAGNOSIS — K7460 Unspecified cirrhosis of liver: Secondary | ICD-10-CM | POA: Diagnosis present

## 2024-01-04 DIAGNOSIS — G4733 Obstructive sleep apnea (adult) (pediatric): Secondary | ICD-10-CM | POA: Diagnosis present

## 2024-01-04 DIAGNOSIS — M47813 Spondylosis without myelopathy or radiculopathy, cervicothoracic region: Secondary | ICD-10-CM | POA: Diagnosis not present

## 2024-01-04 DIAGNOSIS — M4722 Other spondylosis with radiculopathy, cervical region: Secondary | ICD-10-CM | POA: Diagnosis present

## 2024-01-04 DIAGNOSIS — E118 Type 2 diabetes mellitus with unspecified complications: Secondary | ICD-10-CM | POA: Diagnosis present

## 2024-01-04 DIAGNOSIS — I2510 Atherosclerotic heart disease of native coronary artery without angina pectoris: Secondary | ICD-10-CM | POA: Diagnosis present

## 2024-01-04 DIAGNOSIS — Z800 Family history of malignant neoplasm of digestive organs: Secondary | ICD-10-CM

## 2024-01-04 DIAGNOSIS — E039 Hypothyroidism, unspecified: Secondary | ICD-10-CM | POA: Diagnosis not present

## 2024-01-04 DIAGNOSIS — S199XXA Unspecified injury of neck, initial encounter: Secondary | ICD-10-CM | POA: Diagnosis not present

## 2024-01-04 DIAGNOSIS — I6782 Cerebral ischemia: Secondary | ICD-10-CM | POA: Diagnosis not present

## 2024-01-04 DIAGNOSIS — Z888 Allergy status to other drugs, medicaments and biological substances status: Secondary | ICD-10-CM

## 2024-01-04 DIAGNOSIS — Z7985 Long-term (current) use of injectable non-insulin antidiabetic drugs: Secondary | ICD-10-CM

## 2024-01-04 DIAGNOSIS — E1165 Type 2 diabetes mellitus with hyperglycemia: Secondary | ICD-10-CM | POA: Diagnosis present

## 2024-01-04 DIAGNOSIS — I110 Hypertensive heart disease with heart failure: Secondary | ICD-10-CM | POA: Diagnosis present

## 2024-01-04 DIAGNOSIS — Z7951 Long term (current) use of inhaled steroids: Secondary | ICD-10-CM

## 2024-01-04 DIAGNOSIS — R739 Hyperglycemia, unspecified: Secondary | ICD-10-CM | POA: Diagnosis not present

## 2024-01-04 DIAGNOSIS — K7581 Nonalcoholic steatohepatitis (NASH): Secondary | ICD-10-CM | POA: Diagnosis present

## 2024-01-04 DIAGNOSIS — I447 Left bundle-branch block, unspecified: Secondary | ICD-10-CM | POA: Diagnosis present

## 2024-01-04 DIAGNOSIS — I5032 Chronic diastolic (congestive) heart failure: Secondary | ICD-10-CM | POA: Diagnosis present

## 2024-01-04 DIAGNOSIS — J45909 Unspecified asthma, uncomplicated: Secondary | ICD-10-CM | POA: Diagnosis present

## 2024-01-04 DIAGNOSIS — Z79890 Hormone replacement therapy (postmenopausal): Secondary | ICD-10-CM

## 2024-01-04 DIAGNOSIS — Z7901 Long term (current) use of anticoagulants: Secondary | ICD-10-CM

## 2024-01-04 DIAGNOSIS — I5181 Takotsubo syndrome: Secondary | ICD-10-CM | POA: Diagnosis present

## 2024-01-04 LAB — COMPREHENSIVE METABOLIC PANEL WITH GFR
ALT: 14 U/L (ref 0–44)
AST: 20 U/L (ref 15–41)
Albumin: 4.4 g/dL (ref 3.5–5.0)
Alkaline Phosphatase: 105 U/L (ref 38–126)
Anion gap: 11 (ref 5–15)
BUN: 16 mg/dL (ref 8–23)
CO2: 25 mmol/L (ref 22–32)
Calcium: 10 mg/dL (ref 8.9–10.3)
Chloride: 102 mmol/L (ref 98–111)
Creatinine, Ser: 0.84 mg/dL (ref 0.44–1.00)
GFR, Estimated: >60 mL/min (ref >60)
Glucose, Bld: 275 mg/dL — ABNORMAL HIGH (ref 70–99)
Potassium: 4.6 mmol/L (ref 3.5–5.1)
Sodium: 138 mmol/L (ref 135–145)
Total Bilirubin: 0.3 mg/dL (ref 0.0–1.2)
Total Protein: 7.2 g/dL (ref 6.5–8.1)

## 2024-01-04 LAB — CBC
HCT: 42.5 % (ref 36.0–46.0)
Hemoglobin: 13.6 g/dL (ref 12.0–15.0)
MCH: 29 pg (ref 26.0–34.0)
MCHC: 32 g/dL (ref 30.0–36.0)
MCV: 90.6 fL (ref 80.0–100.0)
Platelets: 257 K/uL (ref 150–400)
RBC: 4.69 MIL/uL (ref 3.87–5.11)
RDW: 13.3 % (ref 11.5–15.5)
WBC: 8 K/uL (ref 4.0–10.5)
nRBC: 0 % (ref 0.0–0.2)

## 2024-01-04 LAB — GLUCOSE, CAPILLARY: Glucose-Capillary: 151 mg/dL — ABNORMAL HIGH (ref 70–99)

## 2024-01-04 LAB — PRO BRAIN NATRIURETIC PEPTIDE: Pro Brain Natriuretic Peptide: 633 pg/mL — ABNORMAL HIGH (ref <300.0)

## 2024-01-04 LAB — TROPONIN T, HIGH SENSITIVITY
Troponin T High Sensitivity: 18 ng/L (ref 0–19)
Troponin T High Sensitivity: 25 ng/L — ABNORMAL HIGH (ref 0–19)

## 2024-01-04 LAB — TYPE AND SCREEN
ABO/RH(D): O POS
Antibody Screen: NEGATIVE

## 2024-01-04 LAB — APTT: aPTT: 33 s (ref 24–36)

## 2024-01-04 LAB — MAGNESIUM: Magnesium: 2 mg/dL (ref 1.7–2.4)

## 2024-01-04 LAB — TSH: TSH: 0.936 u[IU]/mL (ref 0.350–4.500)

## 2024-01-04 MED ORDER — ONDANSETRON HCL 4 MG/2ML IJ SOLN
4.0000 mg | Freq: Once | INTRAMUSCULAR | Status: AC
  Administered 2024-01-04: 4 mg via INTRAVENOUS
  Filled 2024-01-04: qty 2

## 2024-01-04 MED ORDER — FREESTYLE LIBRE 3 PLUS SENSOR MISC: 1.0000 | Status: DC

## 2024-01-04 MED ORDER — FLUTICASONE FUROATE-VILANTEROL 100-25 MCG/ACT IN AEPB
1.0000 | INHALATION_SPRAY | Freq: Every day | RESPIRATORY_TRACT | Status: DC
  Filled 2024-01-04: qty 28

## 2024-01-04 MED ORDER — SODIUM CHLORIDE 0.9 % IV SOLN: INTRAVENOUS | Status: AC

## 2024-01-04 MED ORDER — LOSARTAN POTASSIUM 25 MG PO TABS
25.0000 mg | ORAL_TABLET | Freq: Every day | ORAL | Status: DC
  Administered 2024-01-05 – 2024-01-07 (×3): 25 mg via ORAL
  Filled 2024-01-04 (×3): qty 1

## 2024-01-04 MED ORDER — SODIUM CHLORIDE 0.9 % IV BOLUS
1000.0000 mL | Freq: Once | INTRAVENOUS | Status: AC
  Administered 2024-01-04: 1000 mL via INTRAVENOUS

## 2024-01-04 MED ORDER — AMIODARONE HCL 200 MG PO TABS
100.0000 mg | ORAL_TABLET | Freq: Every day | ORAL | Status: DC
  Administered 2024-01-05 – 2024-01-07 (×3): 100 mg via ORAL
  Filled 2024-01-04 (×3): qty 1

## 2024-01-04 MED ORDER — AMIODARONE HCL 200 MG PO TABS: 200.0000 mg | ORAL_TABLET | Freq: Every day | ORAL | Status: DC

## 2024-01-04 MED ORDER — IOHEXOL 350 MG/ML SOLN
75.0000 mL | Freq: Once | INTRAVENOUS | Status: AC | PRN
  Administered 2024-01-04: 75 mL via INTRAVENOUS

## 2024-01-04 MED ORDER — FLUTICASONE PROPIONATE 50 MCG/ACT NA SUSP
1.0000 | Freq: Every day | NASAL | Status: DC
  Administered 2024-01-05: 1 via NASAL
  Filled 2024-01-04: qty 16

## 2024-01-04 MED ORDER — GADOBUTROL 1 MMOL/ML IV SOLN
9.0000 mL | Freq: Once | INTRAVENOUS | Status: AC | PRN
  Administered 2024-01-04: 9 mL via INTRAVENOUS

## 2024-01-04 MED ORDER — MORPHINE SULFATE (PF) 4 MG/ML IV SOLN
4.0000 mg | INTRAVENOUS | Status: DC | PRN
  Administered 2024-01-04 – 2024-01-05 (×3): 4 mg via INTRAVENOUS
  Filled 2024-01-04 (×4): qty 1

## 2024-01-04 MED ORDER — ALPRAZOLAM 0.25 MG PO TABS: 0.2500 mg | ORAL_TABLET | Freq: Three times a day (TID) | ORAL | Status: DC | PRN

## 2024-01-04 MED ORDER — LEVOTHYROXINE SODIUM 50 MCG PO TABS
150.0000 ug | ORAL_TABLET | Freq: Every day | ORAL | Status: DC
  Administered 2024-01-05 – 2024-01-07 (×3): 150 ug via ORAL
  Filled 2024-01-04 (×3): qty 3

## 2024-01-04 MED ORDER — TORSEMIDE 20 MG PO TABS: 20.0000 mg | ORAL_TABLET | ORAL | Status: DC | PRN

## 2024-01-04 MED ORDER — ALBUTEROL SULFATE (2.5 MG/3ML) 0.083% IN NEBU: 2.5000 mg | INHALATION_SOLUTION | Freq: Four times a day (QID) | RESPIRATORY_TRACT | Status: DC | PRN

## 2024-01-04 MED ORDER — LIDOCAINE 5 % EX PTCH
1.0000 | MEDICATED_PATCH | CUTANEOUS | Status: DC
  Administered 2024-01-04 – 2024-01-06 (×3): 1 via TRANSDERMAL
  Filled 2024-01-04 (×3): qty 1

## 2024-01-04 MED ORDER — INSULIN ASPART 100 UNIT/ML IJ SOLN
0.0000 [IU] | Freq: Every day | INTRAMUSCULAR | Status: DC
  Administered 2024-01-06: 3 [IU] via SUBCUTANEOUS
  Filled 2024-01-04: qty 3

## 2024-01-04 MED ORDER — APIXABAN 5 MG PO TABS
5.0000 mg | ORAL_TABLET | Freq: Two times a day (BID) | ORAL | Status: DC
  Administered 2024-01-04 – 2024-01-07 (×6): 5 mg via ORAL
  Filled 2024-01-04 (×6): qty 1

## 2024-01-04 MED ORDER — FUROSEMIDE 20 MG PO TABS
20.0000 mg | ORAL_TABLET | Freq: Every day | ORAL | Status: DC
  Administered 2024-01-05 – 2024-01-07 (×3): 20 mg via ORAL
  Filled 2024-01-04 (×3): qty 1

## 2024-01-04 MED ORDER — LANCETS MISC: 1.0000 | Status: DC

## 2024-01-04 MED ORDER — SODIUM CHLORIDE 0.9% FLUSH
3.0000 mL | Freq: Two times a day (BID) | INTRAVENOUS | Status: DC
  Administered 2024-01-04 – 2024-01-07 (×4): 3 mL via INTRAVENOUS

## 2024-01-04 MED ORDER — ATORVASTATIN CALCIUM 20 MG PO TABS
40.0000 mg | ORAL_TABLET | Freq: Every day | ORAL | Status: DC
  Administered 2024-01-05 – 2024-01-07 (×3): 40 mg via ORAL
  Filled 2024-01-04 (×3): qty 2

## 2024-01-04 MED ORDER — INSULIN ASPART 100 UNIT/ML IJ SOLN
0.0000 [IU] | Freq: Three times a day (TID) | INTRAMUSCULAR | Status: DC
  Administered 2024-01-05: 5 [IU] via SUBCUTANEOUS
  Administered 2024-01-05: 2 [IU] via SUBCUTANEOUS
  Administered 2024-01-06 (×2): 5 [IU] via SUBCUTANEOUS
  Administered 2024-01-06: 8 [IU] via SUBCUTANEOUS
  Administered 2024-01-07 (×2): 11 [IU] via SUBCUTANEOUS
  Filled 2024-01-04 (×2): qty 11
  Filled 2024-01-04: qty 8
  Filled 2024-01-04 (×3): qty 5
  Filled 2024-01-04: qty 2

## 2024-01-04 MED ORDER — OXYCODONE-ACETAMINOPHEN 5-325 MG PO TABS
1.0000 | ORAL_TABLET | Freq: Once | ORAL | Status: AC
  Administered 2024-01-04: 1 via ORAL
  Filled 2024-01-04: qty 1

## 2024-01-04 MED ORDER — MORPHINE SULFATE (PF) 4 MG/ML IV SOLN
4.0000 mg | Freq: Once | INTRAVENOUS | Status: AC
  Administered 2024-01-04: 4 mg via INTRAVENOUS
  Filled 2024-01-04: qty 1

## 2024-01-04 MED ORDER — PAROXETINE HCL 20 MG PO TABS
20.0000 mg | ORAL_TABLET | Freq: Every day | ORAL | Status: DC
  Administered 2024-01-05 – 2024-01-07 (×3): 20 mg via ORAL
  Filled 2024-01-04 (×3): qty 1

## 2024-01-04 NOTE — ED Provider Notes (Signed)
 [facility] Provider Note    Event Date/Time   First MD Initiated Contact with Patient 01/04/24 1520     (approximate)   History   Fall   HPI  Kirsten [name] is a 68 y.o. female  Takotsubo's cardiomyopathy with nonobstructive coronary artery disease, type 2 diabetes, CHF with preserved ejection fraction, P A-fib on Eliquis  and amiodarone  followed by [facility] ([facility]) OSA who presents to the emergency department after syncopal episode.  Patient has experienced 5 days of right-sided neck pain that radiates to her shoulder.  She was being seen at [facility] today.  She was sitting in a chair awaiting an x-ray when she lost consciousness.  She denies any preceding acute pain or movement just before this.  She denies feeling any lightheadedness.  She did fall on her right side and subsequently did have a hematoma.  She was placed in a cervical collar by [facility].  She denies any previous history of syncope.  She endorses compliance with all medication.  Patient uses oxygen  at night through her CPAP but does not use any oxygen  during the day and has no access to nasal cannula oxygen .  On her last echo in 2023: Left ventricular ejection fraction, by estimation, is 55 to 60%      Physical Exam   Triage Vital Signs: ED Triage Vitals  Encounter Vitals Group     BP 01/04/24 1526 (!) 132/107     Girls Systolic BP Percentile --      Girls Diastolic BP Percentile --      Boys Systolic BP Percentile --      Boys Diastolic BP Percentile --      Pulse Rate 01/04/24 1526 92     Resp 01/04/24 1526 20     Temp 01/04/24 1526 99.3 F (37.4 C)     Temp Source 01/04/24 1526 Oral     SpO2 01/04/24 1526 95 %     Weight 01/04/24 1524 200 lb (90.7 kg)     Height 01/04/24 1524 5' 7 (1.702 m)     Head Circumference --      Peak Flow --      Pain Score 01/04/24 1523 7     Pain Loc --      Pain Education --      Exclude from Growth Chart --     Most  recent vital signs: Vitals:   01/04/24 1900 01/04/24 1946  BP:  128/81  Pulse: 67 66  Resp:    Temp:  98.7 F (37.1 C)  SpO2: 97% 97%    Nursing Triage Note reviewed. Vital signs reviewed and patients oxygen  saturation is normoxic  General: Patient is well nourished, well developed, awake and alert, resting comfortably in no acute distress Head: Normocephalic, patient does have ecchymosis over right forehead with a hematoma Eyes: Normal inspection, extraocular muscles intact, no conjunctival pallor Ear, nose, throat: Normal external exam Neck: In cervical collar with C-spine tenderness Respiratory: Patient is in no respiratory distress, lungs CTAB Cardiovascular: Patient is not tachycardic, RRR without murmur appreciated GI: Abd SNT with no guarding or rebound  Back: Normal inspection of the back with good strength and range of motion throughout all ext No T-spine or L-spine tenderness to palpation Extremities: pulses intact with good cap refills, no LE pitting edema or calf tenderness Neuro: The patient is alert and oriented to person, place, and time, appropriately conversive, with 5/5 bilat UE/LE strength, no gross motor or  sensory defects noted. Coordination appears to be adequate.  Able to straight leg lift bilaterally Skin: Warm, dry, and intact Right hand has some erythema which she thinks is secondary to falling Psych: normal mood and affect, no SI or HI  ED Results / Procedures / Treatments   Labs (all labs ordered are listed, but only abnormal results are displayed) Labs Reviewed  COMPREHENSIVE METABOLIC PANEL WITH GFR - Abnormal; Notable for the following components:      Result Value   Glucose, Bld 275 (*)    All other components within normal limits  PRO BRAIN NATRIURETIC PEPTIDE - Abnormal; Notable for the following components:   Pro Brain Natriuretic Peptide 633.0 (*)    All other components within normal limits  GLUCOSE, CAPILLARY - Abnormal; Notable for the  following components:   Glucose-Capillary 151 (*)    All other components within normal limits  TROPONIN T, HIGH SENSITIVITY - Abnormal; Notable for the following components:   Troponin T High Sensitivity 25 (*)    All other components within normal limits  CBC  APTT  MAGNESIUM   TSH  HIV ANTIBODY (ROUTINE TESTING W REFLEX)  URINALYSIS, ROUTINE W REFLEX MICROSCOPIC  CBC  COMPREHENSIVE METABOLIC PANEL WITH GFR  TYPE AND SCREEN  TROPONIN T, HIGH SENSITIVITY     EKG EKG and rhythm strip are interpreted by myself:   EKG: Tachycardic sinus rhythm] at heart rate of 88, normal QRS duration, QTc 480, nonspecific ST segments and T waves no ectopy LBB EKG not consistent with Acute STEMI Rhythm strip: Tachycardic sinus rhythm in lead II   RADIOLOGY CT head: No intracranial hemorrhage on my independent review interpretation and radiologist agrees CT C-spine: No acute fracture CT angio PE with and without contrast: No PE or acute abnormality    PROCEDURES:  Critical Care performed: No  Procedures   MEDICATIONS ORDERED IN ED: Medications  lidocaine  (LIDODERM ) 5 % 1 patch (1 patch Transdermal Patch Applied 01/04/24 1740)  fluticasone  (FLONASE ) 50 MCG/ACT nasal spray 1 spray (has no administration in time range)  apixaban  (ELIQUIS ) tablet 5 mg (5 mg Oral Given 01/04/24 2138)  furosemide  (LASIX ) tablet 20 mg (has no administration in time range)  losartan  (COZAAR ) tablet 25 mg (has no administration in time range)  atorvastatin  (LIPITOR) tablet 40 mg (has no administration in time range)  fluticasone  furoate-vilanterol (BREO ELLIPTA ) 100-25 MCG/ACT 1 puff (has no administration in time range)  albuterol  (PROVENTIL ) (2.5 MG/3ML) 0.083% nebulizer solution 2.5 mg (has no administration in time range)  PARoxetine  (PAXIL ) tablet 20 mg (has no administration in time range)  levothyroxine  (SYNTHROID ) tablet 150 mcg (has no administration in time range)  sodium chloride  flush (NS) 0.9 %  injection 3 mL (3 mLs Intravenous Given 01/04/24 2138)  insulin  aspart (novoLOG ) injection 0-15 Units (has no administration in time range)  insulin  aspart (novoLOG ) injection 0-5 Units ( Subcutaneous Not Given 01/04/24 2131)  0.9 %  sodium chloride  infusion ( Intravenous New Bag/Given 01/04/24 2011)  amiodarone  (PACERONE ) tablet 100 mg (has no administration in time range)  morphine  (PF) 4 MG/ML injection 4 mg (4 mg Intravenous Given 01/05/24 0041)  morphine  (PF) 4 MG/ML injection 4 mg (4 mg Intravenous Given 01/04/24 1555)  ondansetron  (ZOFRAN ) injection 4 mg (4 mg Intravenous Given 01/04/24 1555)  sodium chloride  0.9 % bolus 1,000 mL (0 mLs Intravenous Stopped 01/04/24 1708)  iohexol  (OMNIPAQUE ) 350 MG/ML injection 75 mL (75 mLs Intravenous Contrast Given 01/04/24 1652)  oxyCODONE -acetaminophen  (PERCOCET/ROXICET) 5-325 MG per tablet 1  tablet (1 tablet Oral Given 01/04/24 1740)  gadobutrol  (GADAVIST ) 1 MMOL/ML injection 9 mL (9 mLs Intravenous Contrast Given 01/04/24 2243)     IMPRESSION / MDM / ASSESSMENT AND PLAN / ED COURSE                                Differential diagnosis includes, but is not limited to, arrhythmia, vasovagal syncope, PE, intracranial hemorrhage, cervical spine fracture, electrolyte derangement anemia   ED course: Patient arrives with obvious signs of head trauma but has no focal neurological deficits.  EKG demonstrated a left bundle branch block but on review does not appear significantly changed from a from prior EKG earlier this year.  Troponin is not elevated and BNP is not elevated.  CT head and neck were unremarkable.  CT PE ordered given syncope, hypoxia and possible worsening erythema of the right upper extremity.  This was unremarkable.  Her case was briefly discussed with on-call cardiologist Renne) who did not think she required admission from a cardiology standpoint unless she felt uncomfortable returning home.  After this conversation patient was  noted to be hypoxic and this particularly was worsened with ambulation with a drop to 76%.  Given that she does not have oxygen  at home will request admission from the hospitalist   Clinical Course as of 01/05/24 0057  Sat Jan 04, 2024  1605 CBC No acute anemia no profound platelet derangements [HD]  1625 Troponin T High Sensitivity: 18 Not elevated [HD]  1626 TSH: 0.936 Not elevated [HD]  1626 Comprehensive metabolic panel(!) Only derangement is an elevated glucose without any anion gap [HD]  1635 Pro Brain Natriuretic Peptide(!): 633.0 Not super elevated per age range which should be less than 900 [HD]  1705 CT Angio Chest PE W and/or Wo Contrast No acute abnormalities on my independent review interpretation and radiologist agrees [HD]  1706 Magnesium : 2.0 Within baseline [HD]  1710 Will reach out to cardiology [HD]  1711 CT Head Wo Contrast No intracranial hemorrhage [HD]  1711 CT Cervical Spine Wo Contrast Degenerative changes [HD]  1723 Case discussed with Dr. Marius Bathe on the phone.  He reviewed the workup completed thus far and her history and the relevant information including the EKG.  He states that patient is stable likely from home if she feels comfortable walking without any lightheadedness or dizziness.  Will attempt a lidocaine  patch and give the patient a Percocet for pain control and see if patient can ambulate otherwise he advises patient admission to hospitalist [HD]  1725 Of note, in the room patients pulse ox is hanging around 90%.  She does have a CPAP machine which she uses oxygen  through it at night but does not have nasal cannula oxygen  at home.  Will ambulate and obtain an ambulatory pulse ox [HD]  1748 Unfortunately patient does require 2 L of oxygen  especially with ambulation.  Will discuss admission with hospitalist [HD]  1814 Case discussed with Dr. Sim for admission [HD]    Clinical Course User Index [HD] Nicholaus Rolland BRAVO, MD   -- Risk: 5 This  patient has a high risk of morbidity due to further diagnostic testing or treatment. Rationale: This patient's evaluation and management involve a high risk of morbidity due to the potential severity of presenting symptoms, need for diagnostic testing, and/or initiation of treatment that may require close monitoring. The differential includes conditions with potential for significant deterioration or requiring escalation  of care. Treatment decisions in the ED, including medication administration, procedural interventions, or disposition planning, reflect this level of risk. COPA: 5 The patient has the following acute or chronic illness/injury that poses a possible threat to life or bodily function: [X] : The patient has a potentially serious acute condition or an acute exacerbation of a chronic illness requiring urgent evaluation and management in the Emergency Department. The clinical presentation necessitates immediate consideration of life-threatening or function-threatening diagnoses, even if they are ultimately ruled out.   FINAL CLINICAL IMPRESSION(S) / ED DIAGNOSES   Final diagnoses:  Hypoxia  Syncope, unspecified syncope type  Neck pain     Rx / DC Orders   ED Discharge Orders     None        Note:  This document was prepared using Dragon voice recognition software and may include unintentional dictation errors.   Nicholaus Rolland BRAVO, MD 01/05/24 [phone]

## 2024-01-04 NOTE — ED Notes (Signed)
 RN walked pt as provider advised. Pt walked approx 50 ft and her o2 sat went from 87% on RA to 80% then down to 71%. With a great pleth on the pulse ox. Pt sts that she felt a little SOB but nothing horrible. RN applied 2l/min via [place] which brought pt o2 sat to above 95%. RN notified provider of findings.

## 2024-01-04 NOTE — ED Notes (Signed)
 Delay due to patient being assigned at least 45 minutes prior to the start of my shift. Approved it as expeditiously as possible after reviewing it for appropriateness while dealing with chaos at change of shift and not having a diplomatic services operational officer to answer the phone, the call bell and pass out assigned phones.  Thanks!  Riti Rollyson

## 2024-01-04 NOTE — H&P (Signed)
 History and Physical    Patient: Kirsten [name] FMW:[id number] DOB: September 26, 1955 DOA: 01/04/2024 DOS: the patient was seen and examined on 01/04/2024 PCP: Joshua Debby CROME, MD  Patient coming from: Home  Chief Complaint:  Chief Complaint  Patient presents with   Fall   HPI: ERISA MEHLMAN is a 68 y.o. female with medical history significant of atrial fibrillation, anxiety disorder, essential hypertension, GERD, hyperlipidemia, hypothyroidism, NASH cirrhosis morbid obesity, obstructive sleep apnea, type 2 diabetes, who started having significant neck pain and radiculopathy in the last few days.  Patient went to see orthopedic today.  She was at a [facility] where x-rays were initially being taken.  She was having severe pain as 9 out of 10.  While in the facility patient suddenly had a syncopal episode.  She could not recall how it happened.  [place] on her right [place].  Patient came in with hematoma on the right forehead.  She was placed on cervical collar by the emergent Ortho and sent over to the ER.  Patient has no recent cardiac workup.  She also did have significant hypoxemia.  Not usually on home oxygen  during the day.  She is being admitted for workup of syncopal episode  Review of Systems: As mentioned in the history of present illness. All other systems reviewed and are negative. Past Medical History:  Diagnosis Date   A-fib (HCC)    Anxiety disorder    Arthritis    Asthma    Gastroschisis    umphalocele, rotated organs abd repair until age 48   GERD (gastroesophageal reflux disease)    HTN (hypertension)    Hyperlipidemia    Hypothyroidism    IBS (irritable bowel syndrome)    Morbid obesity (HCC)    Target wt - 185  for BMI < 30   Obesity    OSA on CPAP    uses with 2L O2   Pneumonia    PONV (postoperative nausea and vomiting)    SBO (small bowel obstruction) (HCC)    Resolved with NG/Bowel rest around 2009   Sleep apnea    Type II or unspecified type  diabetes mellitus without mention of complication, not stated as uncontrolled    Past Surgical History:  Procedure Laterality Date   BREAST EXCISIONAL BIOPSY Left 03/19/2012   neg   CESAREAN SECTION     CHOLECYSTECTOMY  1992   COLONOSCOPY  2011   2011-normal   ESOPHAGOGASTRODUODENOSCOPY N/A 10/10/2023   Procedure: EGD (ESOPHAGOGASTRODUODENOSCOPY);  Surgeon: Stacia Glendia BRAVO, MD;  Location: [facility] ENDOSCOPY;  Service: Gastroenterology;  Laterality: N/A;   Newborn Surgery - GI - ORGANS OUTSIDE ABDOMEN     RIGHT/LEFT HEART CATH AND CORONARY ANGIOGRAPHY N/A 10/02/2021   Procedure: RIGHT/LEFT HEART CATH AND CORONARY ANGIOGRAPHY;  Surgeon: Ammon Blunt, MD;  Location: [facility] INVASIVE CV LAB;  Service: Cardiovascular;  Laterality: N/A;   SAVORY DILATION N/A 10/10/2023   Procedure: EGD, WITH DILATION USING SAVARY-GILLIARD DILATOR OVER GUIDEWIRE;  Surgeon: Stacia Glendia BRAVO, MD;  Location: [facility] ENDOSCOPY;  Service: Gastroenterology;  Laterality: N/A;   Small Bowel Repair     TUBAL LIGATION  1988   Social History:  reports that she has never smoked. She has never used smokeless tobacco. She reports that she does not drink alcohol and does not use drugs.  Allergies  Allergen Reactions   Doxycycline  Rash   Factive [Gemifloxacin Mesylate] Rash   Sulfonamide Derivatives Rash    REACTION: rash   Crestor  [Rosuvastatin ]  GI upset   Gemifloxacin Rash   Penicillins Hives and Rash    Has patient had a PCN reaction causing immediate rash, facial/tongue/throat swelling, SOB or lightheadedness with hypotension: No Has patient had a PCN reaction causing severe rash involving mucus membranes or skin necrosis: No Has patient had a PCN reaction that required hospitalization No Has patient had a PCN reaction occurring within the last 10 years: No If all of the above answers are NO, then may proceed with Cephalosporin use.     Family History  Problem Relation Age of Onset   Lymphoma Mother     Diabetes type II Father    Colon cancer Father    Stomach cancer Father    Diabetes Maternal Grandmother    Diabetes type I Son    Goiter Neg Hx    Breast cancer Neg Hx    Ovarian cancer Neg Hx    Esophageal cancer Neg Hx    Pancreatic cancer Neg Hx     Prior to Admission medications   Medication Sig Start Date End Date Taking? Authorizing Provider  albuterol  (VENTOLIN  HFA) 108 (90 Base) MCG/ACT inhaler Inhale 2 puffs into the lungs every 6 (six) hours as needed for wheezing or shortness of breath. 11/07/23   Kasa, Kurian, MD  ALPRAZolam  (XANAX ) 0.25 MG tablet Take 1 tablet (0.25 mg total) by mouth 3 (three) times daily as needed for anxiety. 06/26/22   Joshua Debby CROME, MD  amiodarone  (PACERONE ) 200 MG tablet Take 1 tablet (200 mg total) by mouth daily. 11/13/22   Fernand Denyse LABOR, MD  atorvastatin  (LIPITOR) 40 MG tablet TAKE ONE TABLET BY MOUTH ONE TIME DAILY 10/01/23   Joshua Debby CROME, MD  Blood Glucose Monitoring Suppl (ONETOUCH VERIO FLEX SYSTEM) w/Device KIT 1 Act by Does not apply route in the morning, at noon, and at bedtime. 12/20/23   Joshua Debby CROME, MD  Blood Glucose Monitoring Suppl DEVI 1 each by Does not apply route as directed. Dispense based on patient and insurance preference. Use up to four times daily as directed. (FOR ICD-10 E10.9, E11.9). 12/17/23   Joshua Debby CROME, MD  Continuous Glucose Sensor (FREESTYLE LIBRE 3 PLUS SENSOR) MISC Apply 1 Act topically every 14 (fourteen) days. Change sensor every 15 days. 10/01/23   Joshua Debby CROME, MD  cyanocobalamin  (VITAMIN B12) 1000 MCG/ML injection INJECT 1ML INTO THE MUSCLE ONCE A MONTH 12/17/23   Joshua Debby CROME, MD  ELIQUIS  5 MG TABS tablet TAKE ONE TABLET BY MOUTH TWICE A DAY 02/14/23   Joshua Debby CROME, MD  fluticasone  (FLONASE ) 50 MCG/ACT nasal spray Place 1 spray into both nostrils daily. 04/20/22   Caleen Qualia, MD  fluticasone  furoate-vilanterol (BREO ELLIPTA ) 100-25 MCG/ACT AEPB Inhale 1 puff into the lungs daily. 11/07/23   Kasa,  Kurian, MD  furosemide  (LASIX ) 20 MG tablet Take 20 mg by mouth daily.    [provider]  Glucose Blood (BLOOD GLUCOSE TEST STRIPS) STRP 1 each by Does not apply route as directed. Dispense based on patient and insurance preference. Use up to four times daily as directed. (FOR ICD-10 E10.9, E11.9). 12/17/23   Joshua Debby CROME, MD  insulin  degludec (TRESIBA  FLEXTOUCH) 200 UNIT/ML FlexTouch Pen Inject 20 Units into the skin daily. 12/18/23   Joshua Debby CROME, MD  Insulin  Pen Needle 32G X 6 MM MISC 1 Act by Does not apply route once a week. 12/18/23   Joshua Debby CROME, MD  Lancet Device MISC 1 each by Does not  apply route as directed. Dispense based on patient and insurance preference. Use up to four times daily as directed. (FOR ICD-10 E10.9, E11.9). 12/17/23   Joshua Debby CROME, MD  Lancets MISC 1 each by Does not apply route as directed. Dispense based on patient and insurance preference. Use up to four times daily as directed. (FOR ICD-10 E10.9, E11.9). 12/17/23   Joshua Debby CROME, MD  levothyroxine  (SYNTHROID ) 150 MCG tablet TAKE ONE TABLET BY MOUTH ONCE DAILY 12/23/23   Joshua Debby CROME, MD  losartan  (COZAAR ) 25 MG tablet Take 25 mg by mouth daily. 06/29/23   [provider]  metFORMIN  (GLUCOPHAGE -XR) 750 MG 24 hr tablet TAKE ONE TABLET BY MOUTH ONE TIME DAILY WITH BREAKFAST 12/13/23   Joshua Debby CROME, MD  OXYGEN  Inhale 2 L into the lungs at bedtime. Uses with CPAP    [provider]  PARoxetine  (PAXIL ) 20 MG tablet Take 1 tablet (20 mg total) by mouth daily. 11/18/23   Joshua Debby CROME, MD  Semaglutide ,0.25 or 0.5MG /DOS, (OZEMPIC , 0.25 OR 0.5 MG/DOSE,) 2 MG/3ML SOPN Inject 0.25 mg into the skin once a week. 12/30/23   Joshua Debby CROME, MD  torsemide  (DEMADEX ) 20 MG tablet Take 20 mg by mouth as needed (Edema in legs). 04/29/23 04/28/24  [provider]    Physical Exam: Vitals:   01/04/24 1740 01/04/24 1742 01/04/24 1747 01/04/24 1750  BP:      Pulse: 79 81 92 74  Resp:  (!) 22 18    Temp:      TempSrc:      SpO2: (!) 86% (!) 89% (!) 76% 97%  Weight:      Height:       Constitutional: Pleasant, morbidly obese, NAD, calm, comfortable Eyes: PERRL, lids and conjunctivae normal ENMT: Mucous membranes are moist. Posterior pharynx clear of any exudate or lesions.Normal dentition.  Neck: Decreased range of motion with tenderness over posterior neck, no masses, no thyromegaly Respiratory: clear to auscultation bilaterally, no wheezing, no crackles. Normal respiratory effort. No accessory muscle use.  Cardiovascular: Tachycardia, no murmurs / rubs / gallops. No extremity edema. 2+ pedal pulses. No carotid bruits.  Abdomen: no tenderness, no masses palpated. No hepatosplenomegaly. Bowel sounds positive.  Musculoskeletal: Good range of motion, no joint swelling or tenderness, Skin: no rashes, lesions, ulcers. No induration Neurologic: CN 2-12 grossly intact. Sensation intact, DTR normal. Strength 5/5 in all 4.  Psychiatric: Normal judgment and insight. Alert and oriented x 3. Normal mood  Data Reviewed:  Temperature 99.3, blood pressure 144/77, pulse 92 respiratory rate of 23 oxygen  sat 76% on room air initially currently 97%.  CBC and chemistry appear to be within normal.  Glucose 275.  proBNP 633.  Initial troponin 18-second troponin is 25.  CT angio chest chest pulmonary showed no acute findings.  CT cervical spine also showed no acute abnormalities but moderate multilevel degenerative changes with provide minimal stenosis observed head CT without contrast showed right frontal scalp hematoma but otherwise no acute intracranial abnormality.  EKG showed normal sinus rhythm with left bundle branch block  Assessment and Plan:  #1 syncope: Cause is unclear.  Patient has extensive cardiac history but no recent workup.  No echo since 2023.  She could have had arrhythmias.  She was hypoxic on arrival so this could be a cause.  Patient is having significant cervical  radiculopathy and could have had syncope due to some neurologic release.  At this point we will admit the patient for workup.  Get echocardiogram.  Monitor on telemetry.  MRI of the brain.  Consider PT and OT consultation.  #2 cervical radiculopathy: Patient has already had CT and x-ray of the neck.  We will get MRI of the cervical spine to observe.  #3 type 2 diabetes: Initiate sliding scale insulin .  Continue to monitor  #4 NASH cirrhosis: Appears to be at baseline.  Continue to monitor  #5 hyperlipidemia: Continue with statin  #6 paroxysmal atrial fibrillation: In sinus rhythm now.  Continue home regimen  #7 essential hypertension: Resume home regimen.  #8 obstructive sleep apnea: Continue with CPAP  #9 hypothyroidism: Continue levothyroxine   #10 generalized anxiety disorder: Continue home regimen    Advance Care Planning:   Code Status: Full Code   Consults: None  Family Communication: Husband at bedside  Severity of Illness: The appropriate patient status for this patient is OBSERVATION. Observation status is judged to be reasonable and necessary in order to provide the required intensity of service to ensure the patient's safety. The patient's presenting symptoms, physical exam findings, and initial radiographic and laboratory data in the context of their medical condition is felt to place them at decreased risk for further clinical deterioration. Furthermore, it is anticipated that the patient will be medically stable for discharge from the hospital within 2 midnights of admission.   AuthorBETHA SIM KNOLL, MD 01/04/2024 6:44 PM  For on call review [email or web address].

## 2024-01-04 NOTE — ED Triage Notes (Signed)
 Pt arrived via EMS from [facility] where pt has a syncopal episode and hit her head. Pt is on eliquis . Pt was sitting in a chair and passed out and fell out of the chair.

## 2024-01-05 ENCOUNTER — Observation Stay: Admit: 2024-01-05

## 2024-01-05 DIAGNOSIS — J45909 Unspecified asthma, uncomplicated: Secondary | ICD-10-CM | POA: Diagnosis not present

## 2024-01-05 DIAGNOSIS — E785 Hyperlipidemia, unspecified: Secondary | ICD-10-CM | POA: Diagnosis not present

## 2024-01-05 DIAGNOSIS — E1165 Type 2 diabetes mellitus with hyperglycemia: Secondary | ICD-10-CM | POA: Diagnosis not present

## 2024-01-05 DIAGNOSIS — Z794 Long term (current) use of insulin: Secondary | ICD-10-CM | POA: Diagnosis not present

## 2024-01-05 DIAGNOSIS — R0902 Hypoxemia: Secondary | ICD-10-CM | POA: Diagnosis not present

## 2024-01-05 DIAGNOSIS — I447 Left bundle-branch block, unspecified: Secondary | ICD-10-CM | POA: Diagnosis not present

## 2024-01-05 DIAGNOSIS — I5032 Chronic diastolic (congestive) heart failure: Secondary | ICD-10-CM | POA: Diagnosis not present

## 2024-01-05 DIAGNOSIS — K7460 Unspecified cirrhosis of liver: Secondary | ICD-10-CM | POA: Diagnosis not present

## 2024-01-05 DIAGNOSIS — Z7985 Long-term (current) use of injectable non-insulin antidiabetic drugs: Secondary | ICD-10-CM | POA: Diagnosis not present

## 2024-01-05 DIAGNOSIS — Z7901 Long term (current) use of anticoagulants: Secondary | ICD-10-CM | POA: Diagnosis not present

## 2024-01-05 DIAGNOSIS — E039 Hypothyroidism, unspecified: Secondary | ICD-10-CM | POA: Diagnosis not present

## 2024-01-05 DIAGNOSIS — G4700 Insomnia, unspecified: Secondary | ICD-10-CM | POA: Diagnosis not present

## 2024-01-05 DIAGNOSIS — F411 Generalized anxiety disorder: Secondary | ICD-10-CM | POA: Diagnosis not present

## 2024-01-05 DIAGNOSIS — I110 Hypertensive heart disease with heart failure: Secondary | ICD-10-CM | POA: Diagnosis not present

## 2024-01-05 DIAGNOSIS — Z7984 Long term (current) use of oral hypoglycemic drugs: Secondary | ICD-10-CM | POA: Diagnosis not present

## 2024-01-05 DIAGNOSIS — I10 Essential (primary) hypertension: Secondary | ICD-10-CM | POA: Diagnosis not present

## 2024-01-05 DIAGNOSIS — R079 Chest pain, unspecified: Secondary | ICD-10-CM | POA: Diagnosis not present

## 2024-01-05 DIAGNOSIS — G4733 Obstructive sleep apnea (adult) (pediatric): Secondary | ICD-10-CM

## 2024-01-05 DIAGNOSIS — I480 Paroxysmal atrial fibrillation: Secondary | ICD-10-CM | POA: Diagnosis not present

## 2024-01-05 DIAGNOSIS — Z79899 Other long term (current) drug therapy: Secondary | ICD-10-CM | POA: Diagnosis not present

## 2024-01-05 DIAGNOSIS — M542 Cervicalgia: Secondary | ICD-10-CM | POA: Diagnosis not present

## 2024-01-05 DIAGNOSIS — I5181 Takotsubo syndrome: Secondary | ICD-10-CM | POA: Diagnosis not present

## 2024-01-05 DIAGNOSIS — I2510 Atherosclerotic heart disease of native coronary artery without angina pectoris: Secondary | ICD-10-CM | POA: Diagnosis not present

## 2024-01-05 DIAGNOSIS — K7581 Nonalcoholic steatohepatitis (NASH): Secondary | ICD-10-CM | POA: Diagnosis not present

## 2024-01-05 DIAGNOSIS — R55 Syncope and collapse: Secondary | ICD-10-CM | POA: Diagnosis not present

## 2024-01-05 DIAGNOSIS — Z79890 Hormone replacement therapy (postmenopausal): Secondary | ICD-10-CM | POA: Diagnosis not present

## 2024-01-05 DIAGNOSIS — Z833 Family history of diabetes mellitus: Secondary | ICD-10-CM | POA: Diagnosis not present

## 2024-01-05 LAB — URINALYSIS, ROUTINE W REFLEX MICROSCOPIC
Bilirubin Urine: NEGATIVE
Glucose, UA: NEGATIVE mg/dL
Hgb urine dipstick: NEGATIVE
Ketones, ur: NEGATIVE mg/dL
Leukocytes,Ua: NEGATIVE
Nitrite: NEGATIVE
Protein, ur: NEGATIVE mg/dL
Specific Gravity, Urine: 1.01 (ref 1.005–1.030)
pH: 5 (ref 5.0–8.0)

## 2024-01-05 LAB — COMPREHENSIVE METABOLIC PANEL WITH GFR
ALT: 13 U/L (ref 0–44)
AST: 19 U/L (ref 15–41)
Albumin: 4.1 g/dL (ref 3.5–5.0)
Alkaline Phosphatase: 84 U/L (ref 38–126)
Anion gap: 7 (ref 5–15)
BUN: 14 mg/dL (ref 8–23)
CO2: 29 mmol/L (ref 22–32)
Calcium: 9.3 mg/dL (ref 8.9–10.3)
Chloride: 106 mmol/L (ref 98–111)
Creatinine, Ser: 0.86 mg/dL (ref 0.44–1.00)
GFR, Estimated: >60 mL/min (ref >60)
Glucose, Bld: 140 mg/dL — ABNORMAL HIGH (ref 70–99)
Potassium: 4 mmol/L (ref 3.5–5.1)
Sodium: 142 mmol/L (ref 135–145)
Total Bilirubin: 0.3 mg/dL (ref 0.0–1.2)
Total Protein: 6.4 g/dL — ABNORMAL LOW (ref 6.5–8.1)

## 2024-01-05 LAB — HIV ANTIBODY (ROUTINE TESTING W REFLEX): HIV Screen 4th Generation wRfx: NONREACTIVE

## 2024-01-05 LAB — GLUCOSE, CAPILLARY
Glucose-Capillary: 149 mg/dL — ABNORMAL HIGH (ref 70–99)
Glucose-Capillary: 105 mg/dL — ABNORMAL HIGH (ref 70–99)
Glucose-Capillary: 224 mg/dL — ABNORMAL HIGH (ref 70–99)
Glucose-Capillary: 165 mg/dL — ABNORMAL HIGH (ref 70–99)

## 2024-01-05 LAB — CBC
HCT: 40.3 % (ref 36.0–46.0)
Hemoglobin: 12 g/dL (ref 12.0–15.0)
MCH: 28.6 pg (ref 26.0–34.0)
MCHC: 29.8 g/dL — ABNORMAL LOW (ref 30.0–36.0)
MCV: 96.2 fL (ref 80.0–100.0)
Platelets: 290 K/uL (ref 150–400)
RBC: 4.19 MIL/uL (ref 3.87–5.11)
RDW: 13.7 % (ref 11.5–15.5)
WBC: 12.3 K/uL — ABNORMAL HIGH (ref 4.0–10.5)
nRBC: 0 % (ref 0.0–0.2)

## 2024-01-05 MED ORDER — DEXAMETHASONE 4 MG PO TABS
4.0000 mg | ORAL_TABLET | Freq: Four times a day (QID) | ORAL | Status: DC
  Administered 2024-01-05 – 2024-01-07 (×7): 4 mg via ORAL
  Filled 2024-01-05 (×10): qty 1

## 2024-01-05 MED ORDER — MUSCLE RUB 10-15 % EX CREA: TOPICAL_CREAM | CUTANEOUS | Status: DC | PRN

## 2024-01-05 MED ORDER — OXYCODONE-ACETAMINOPHEN 5-325 MG PO TABS
1.0000 | ORAL_TABLET | ORAL | Status: DC | PRN
  Administered 2024-01-05 – 2024-01-07 (×5): 1 via ORAL
  Filled 2024-01-05 (×5): qty 1

## 2024-01-05 MED ORDER — SODIUM CHLORIDE 0.9 % IV SOLN: INTRAVENOUS | Status: AC

## 2024-01-05 MED ORDER — ALPRAZOLAM 0.25 MG PO TABS
0.2500 mg | ORAL_TABLET | Freq: Three times a day (TID) | ORAL | Status: DC
  Administered 2024-01-05 – 2024-01-06 (×6): 0.25 mg via ORAL
  Filled 2024-01-05 (×6): qty 1

## 2024-01-05 MED ORDER — KETOROLAC TROMETHAMINE 15 MG/ML IJ SOLN
15.0000 mg | Freq: Four times a day (QID) | INTRAMUSCULAR | Status: DC
  Administered 2024-01-05 – 2024-01-07 (×6): 15 mg via INTRAVENOUS
  Filled 2024-01-05 (×9): qty 1

## 2024-01-05 MED ORDER — DEXAMETHASONE 4 MG PO TABS
4.0000 mg | ORAL_TABLET | Freq: Every day | ORAL | Status: DC
  Administered 2024-01-05: 4 mg via ORAL
  Filled 2024-01-05: qty 1

## 2024-01-05 NOTE — Evaluation (Signed)
 Physical Therapy Evaluation Patient Details [name] MRN: [id number] DOB: 05-02-1955 Today's Date: 01/05/2024  History of Present Illness  Kirsten [name] is a 68 y.o. female with medical history significant of atrial fibrillation, anxiety disorder, essential hypertension, GERD, hyperlipidemia, hypothyroidism, NASH cirrhosis morbid obesity, obstructive sleep apnea, type 2 diabetes, who started having significant neck pain and radiculopathy in the last few days.  Patient went to see orthopedic today.  She was at a [facility] where x-rays were initially being taken.  She was having severe pain as 9 out of 10.  While in the facility patient suddenly had a syncopal episode.  She could not recall how it happened.  [place] her right side.  Patient came in with hematoma on the right forehead.  She was placed on cervical collar by the emergent Ortho and sent over to the ER.  Patient has no recent cardiac workup.  She also did have significant hypoxemia.  Not usually on home oxygen  during the day.  She is being admitted for workup of syncopal episode  Clinical Impression  Based on the initial physical therapy evaluation, the patient presents with a decline in functional status, demonstrating moderate mobility limitations. Patient alert and oriented (A&O x 4) and was noted to be in a sitting position at PT arrival. Despite having a baseline of independent mobility, the patient currently requires minimum assistance with contact guard assist (min/CGA) for ambulation. The patient lives in a house with their husband and has family or friend support.   The patient's vital signs were noted to be unstable during the evaluation. Prior to ambulating 50 feet the patient  was sitting at rest SpO2 of 92% on 2 L/min of supplemental oxygen , the patient's SpO2 dropped to 74% upon returning to the room to recover. It took one minute and 47 seconds to recover back to 92% with the use of supplemental  oxygen . The patient's oxygen  saturation did not improve with verbal cues for proper breathing alone.   Gait was assessed using a rolling walker (RW) and was limited by activity tolerance. Observations revealed a slow gait speed and decreased step length. The overall clinical impression is that the patient has moderate mobility limitations secondary to an abnormal response to mobility. A course of skilled physical therapy is recommended to address safety, mobility, and discharge planning. The plan is to discharge the patient to home health physical therapy (HHPT) upon receiving medical clearance.        If plan is discharge home, recommend the following: A little help with walking and/or transfers;A little help with bathing/dressing/bathroom;Help with stairs or ramp for entrance;Assist for transportation   Can travel by private vehicle        Equipment Recommendations Other (comment) (TBD)  Recommendations for Other Services       Functional Status Assessment Patient has had a recent decline in their functional status and demonstrates the ability to make significant improvements in function in a reasonable and predictable amount of time.     Precautions / Restrictions Precautions Precautions: Fall Restrictions Weight Bearing Restrictions Per Provider Order: No      Mobility  Bed Mobility Overal bed mobility: Needs Assistance Bed Mobility: Supine to Sit, Sit to Supine     Supine to sit: Min assist, Contact guard Sit to supine: Contact guard assist, Min assist   General bed mobility comments: little assist provided    Transfers Overall transfer level: Needs assistance Equipment used: Rolling walker (2 wheels) Transfers:  Sit to/from Stand Sit to Stand: Contact guard assist, Min assist           General transfer comment: very slow and cautious to move    Ambulation/Gait Ambulation/Gait assistance: Contact guard assist Gait Distance (Feet): 50 Feet Assistive device:  Rolling walker (2 wheels) Gait Pattern/deviations: Step-through pattern, Decreased stride length, Drifts right/left, Trunk flexed, Narrow base of support Gait velocity: decreased     General Gait Details: ambulatde 50' feet on RA 92% spO2 prior to ambulation bout; pt. returned to room to recover spO2 74%; pt required 1 min. and 47 sec. to recover to 92% with 2L/min. supplemental oxygen   Stairs            Wheelchair Mobility     Tilt Bed    Modified Rankin (Stroke Patients Only)       Balance Overall balance assessment: Needs assistance Sitting-balance support: Feet supported Sitting balance-Leahy Scale: Fair Sitting balance - Comments: sways in sitting when unsupported   Standing balance support: Reliant on assistive device for balance, During functional activity Standing balance-Leahy Scale: Poor Standing balance comment: unsteady with RW                             Pertinent Vitals/Pain Pain Assessment Pain Assessment: Faces Faces Pain Scale: Hurts little more Pain Location: neck Pain Descriptors / Indicators: Aching Pain Intervention(s): Monitored during session    Home Living Family/patient expects to be discharged to:: Private residence Living Arrangements: Spouse/significant other Available Help at Discharge: Family;Available 24 hours/day Type of Home: House Home Access: Stairs to enter Entrance Stairs-Rails: Can reach both;Left;Right Entrance Stairs-Number of Steps: 3   Home Layout: One level Home Equipment: Grab bars - tub/shower Additional Comments: pt uses cpap at night but no O2 at baseline    Prior Function Prior Level of Function : Independent/Modified Independent             Mobility Comments: independent with all mobility       Extremity/Trunk Assessment        Lower Extremity Assessment Lower Extremity Assessment: Generalized weakness    Cervical / Trunk Assessment Cervical / Trunk Assessment: Kyphotic   Communication   Communication Communication: No apparent difficulties    Cognition Arousal: Alert Behavior During Therapy: WFL for tasks assessed/performed   PT - Cognitive impairments: No apparent impairments                         Following commands: Intact       Cueing Cueing Techniques: Verbal cues     General Comments      Exercises     Assessment/Plan    PT Assessment Patient needs continued PT services  PT Problem List Decreased strength;Decreased activity tolerance;Decreased balance;Decreased mobility       PT Treatment Interventions DME instruction;Gait training;Functional mobility training;Therapeutic activities;Therapeutic exercise;Stair training;Neuromuscular re-education;Balance training;Patient/family education    PT Goals (Current goals can be found in the Care Plan section)  Acute Rehab PT Goals Patient Stated Goal: wants to get better PT Goal Formulation: With patient/family Time For Goal Achievement: 01/26/24 Potential to Achieve Goals: Good    Frequency Min 2X/week     Co-evaluation               AM-PAC PT 6 Clicks Mobility  Outcome Measure Help needed turning from your back to your side while in a flat bed without using bedrails?: None Help needed  moving from lying on your back to sitting on the side of a flat bed without using bedrails?: None Help needed moving to and from a bed to a chair (including a wheelchair)?: A Little Help needed standing up from a chair using your arms (e.g., wheelchair or bedside chair)?: A Little Help needed to walk in hospital room?: A Little Help needed climbing 3-5 steps with a railing? : A Lot 6 Click Score: 19    End of Session Equipment Utilized During Treatment: Gait belt Activity Tolerance: Patient tolerated treatment well;Patient limited by fatigue Patient left: in bed;with call bell/phone within reach;with chair alarm set;with family/visitor present Nurse Communication: Mobility  status PT Visit Diagnosis: Other abnormalities of gait and mobility (R26.89);Difficulty in walking, not elsewhere classified (R26.2);Other symptoms and signs involving the nervous system (R29.898);Muscle weakness (generalized) (M62.81)    Time: 8662-8641 PT Time Calculation (min) (ACUTE ONLY): 21 min   Charges:   PT Evaluation $PT Eval Low Complexity: 1 Low   PT General Charges $$ ACUTE PT VISIT: 1 Visit         Sherlean Lesches DPT, PT    Karmel Patricelli A Jearld Hemp 01/05/2024, 2:14 PM

## 2024-01-05 NOTE — Evaluation (Signed)
 Occupational Therapy Evaluation Patient Details [name] MRN: [id number] DOB: [birth year] Today's Date: 01/05/2024   History of Present Illness   Kirsten [name] is a 68 y.o. female with medical history significant of atrial fibrillation, anxiety disorder, essential hypertension, GERD, hyperlipidemia, hypothyroidism, NASH cirrhosis morbid obesity, obstructive sleep apnea, type 2 diabetes, who started having significant neck pain and radiculopathy in the last few days.  Patient went to see orthopedic today.  She was at a [facility] Orth[facility] where x-rays were initially being taken.  She was having severe pain as 9 out of 10.  While in the facility patient suddenly had a syncopal episode.     Clinical Impressions Pt was seen for OT evaluation this date. Prior to hospital admission, pt was indep in all ADLs/IADLs. Pt lives with her husband in one level home with 3 steps to enter. Pt presents with deficits in minimal deficits decreased Ind in self care, balance, functional mobility/transfers and activity tolerance affecting safe and optimal ADL completion. Pt currently requires MINA for bed mobility, 2-1 HHA throughout in hallway ambulation to progress functional mobility and activity tolerance. Pt completed LB dressing with supervision. Pt on 2.5L oxygen  on arrival to room spo2 levels 94% at rest, during ambulation pt desat to 83%, bumped pt to 3L where it took ~1.5 min to recover back to >90%. RN aware. Pt would benefit from skilled OT services to address noted impairments and functional limitations (see below for any additional details) in order to maximize safety and independence while minimizing future risk of falls, injury, and readmission. OT will follow acutely.    If plan is discharge home, recommend the following:   A little help with walking and/or transfers;A little help with bathing/dressing/bathroom;Help with stairs or ramp for entrance;Assist for transportation     Functional  Status Assessment   Patient has had a recent decline in their functional status and demonstrates the ability to make significant improvements in function in a reasonable and predictable amount of time.     Equipment Recommendations   Tub/shower seat     Recommendations for Other Services         Precautions/Restrictions   Precautions Precautions: Fall Recall of Precautions/Restrictions: Intact Restrictions Weight Bearing Restrictions Per Provider Order: No     Mobility Bed Mobility Overal bed mobility: Needs Assistance Bed Mobility: Supine to Sit, Sit to Supine     Supine to sit: Min assist Sit to supine: Contact guard assist, Min assist   General bed mobility comments: 1HHA for bed mobility, good control, educated on log roll for comfort    Transfers Overall transfer level: Needs assistance Equipment used: 1 person hand held assist Transfers: Sit to/from Stand Sit to Stand: Contact guard assist, Min assist           General transfer comment: very slow and cautious to move      Balance Overall balance assessment: Needs assistance Sitting-balance support: Feet supported Sitting balance-Leahy Scale: Good     Standing balance support: Reliant on assistive device for balance, During functional activity Standing balance-Leahy Scale: Poor                             ADL either performed or assessed with clinical judgement   ADL Overall ADL's : Needs assistance/impaired Eating/Feeding: Set up;Sitting   Grooming: Wash/dry face;Wash/dry hands;Standing;Contact guard assist Grooming Details (indicate cue type and reason): Sink level  Lower Body Dressing: Supervision/safety;Sitting/lateral leans   Toilet Transfer: Ambulation;Minimal assistance Toilet Transfer Details (indicate cue type and reason): I HHA         Functional mobility during ADLs: Contact guard assist;Minimal assistance General ADL Comments: Simulated toilet  transfer  CGA-1 HHA     Vision Baseline Vision/History: 1 Wears glasses       Perception         Praxis         Pertinent Vitals/Pain Pain Assessment Pain Assessment: Faces Faces Pain Scale: Hurts little more Pain Location: neck Pain Descriptors / Indicators: Aching Pain Intervention(s): Limited activity within patient's tolerance, Monitored during session, Premedicated before session     Extremity/Trunk Assessment Upper Extremity Assessment Upper Extremity Assessment: Generalized weakness   Lower Extremity Assessment Lower Extremity Assessment: Defer to PT evaluation;Generalized weakness   Cervical / Trunk Assessment Cervical / Trunk Assessment: Normal   Communication Communication Communication: No apparent difficulties   Cognition Arousal: Alert Behavior During Therapy: WFL for tasks assessed/performed Cognition: No apparent impairments             OT - Cognition Comments: A/Ox4                 Following commands: Intact       Cueing  General Comments   Cueing Techniques: Verbal cues  Orthostatic vitials taken during session, negative   Exercises Exercises: Other exercises Other Exercises Other Exercises: Edu: Role of OT eval, safe ADL completion, fall prevention, discharge planning   Shoulder Instructions      Home Living Family/patient expects to be discharged to:: Private residence Living Arrangements: Spouse/significant other Available Help at Discharge: Family;Available 24 hours/day Type of Home: House Home Access: Stairs to enter [employer] of Steps: 3 Entrance Stairs-Rails: Can reach both;Left;Right Home Layout: One level     Bathroom Shower/Tub: Producer, Television/film/video: Handicapped height Bathroom Accessibility: Yes   Home Equipment: Grab bars - tub/shower   Additional Comments: pt uses cpap at night but no O2 at baseline      Prior Functioning/Environment Prior Level of Function :  Independent/Modified Independent             Mobility Comments: independent with all mobility ADLs Comments: Indep    OT Problem List: Decreased strength;Decreased activity tolerance;Impaired balance (sitting and/or standing);Decreased coordination;Decreased safety awareness;Decreased knowledge of use of DME or AE   OT Treatment/Interventions: Self-care/ADL training;Neuromuscular education;Energy conservation;DME and/or AE instruction;Therapeutic activities;Patient/family education;Balance training      OT Goals(Current goals can be found in the care plan section)   Acute Rehab OT Goals Patient Stated Goal: Feel better/get this figured out OT Goal Formulation: With patient/family Time For Goal Achievement: 01/19/24 Potential to Achieve Goals: Good   OT Frequency:  Min 2X/week    Co-evaluation              AM-PAC OT 6 Clicks Daily Activity     Outcome Measure Help from another person eating meals?: None Help from another person taking care of personal grooming?: A Little Help from another person toileting, which includes using toliet, bedpan, or urinal?: None Help from another person bathing (including washing, rinsing, drying)?: A Lot Help from another person to put on and taking off regular upper body clothing?: None Help from another person to put on and taking off regular lower body clothing?: None 6 Click Score: 21   End of Session Equipment Utilized During Treatment: Gait belt;Oxygen  Nurse Communication: Mobility status  Activity Tolerance: Patient  tolerated treatment well Patient left: in bed;with call bell/phone within reach;with bed alarm set;with family/visitor present  OT Visit Diagnosis: Unsteadiness on feet (R26.81);Other abnormalities of gait and mobility (R26.89);Repeated falls (R29.6);Muscle weakness (generalized) (M62.81)                Time: 8397-8365 OT Time Calculation (min): 32 min Charges:  OT General Charges $OT Visit: 1 Visit OT  Evaluation $OT Eval Low Complexity: 1 Low OT Treatments $Therapeutic Activity: 8-22 mins  Larraine Colas M.S. OTR/L  01/05/24, 5:26 PM

## 2024-01-05 NOTE — Progress Notes (Signed)
 Pt. Cpap machine examined by RT.

## 2024-01-05 NOTE — Plan of Care (Signed)
  Problem: Education: Goal: Ability to describe self-care measures that may prevent or decrease complications (Diabetes Survival Skills Education) will improve Outcome: Progressing   Problem: Fluid Volume: Goal: Ability to maintain a balanced intake and output will improve Outcome: Progressing   Problem: Nutritional: Goal: Maintenance of adequate nutrition will improve Outcome: Progressing   Problem: Tissue Perfusion: Goal: Adequacy of tissue perfusion will improve Outcome: Progressing   Problem: Education: Goal: Knowledge of condition and prescribed therapy will improve Outcome: Progressing   Problem: Coping: Goal: Ability to adjust to condition or change in health will improve Outcome: Not Progressing

## 2024-01-05 NOTE — Care Management Obs Status (Signed)
 MEDICARE OBSERVATION STATUS NOTIFICATION   Patient Details  [name] MRN: [id number] Date of Birth: 1955-11-05   Medicare Observation Status Notification Given:  Chaney BRANDY CHRISTIANE LELON, CMA 01/05/2024, 1:22 PM

## 2024-01-05 NOTE — Consult Note (Signed)
 Kirsten [name] is a 68 y.o. female  [id number]  Primary Cardiologist: [facility] cardiology Reason for Consultation: Syncope  HPI: This is a 68 year old white female with a past medical history of paroxysmal atrial fibrillation, hypertension, hyperlipidemia and sleep apnea presented to the hospital after having a fall.  Patient was getting x-ray at the orthopedic office and was having severe 9 out of 10 neck pain along with lightheadedness prior to completely passing out.  She recalls having lightheadedness prior to this episode but no chest pain.  Still having some neck pain.   Review of Systems: No chest pain or shortness of breath   Past Medical History:  Diagnosis Date   A-fib (HCC)    Anxiety disorder    Arthritis    Asthma    Gastroschisis    umphalocele, rotated organs abd repair until age 13   GERD (gastroesophageal reflux disease)    HTN (hypertension)    Hyperlipidemia    Hypothyroidism    IBS (irritable bowel syndrome)    Morbid obesity (HCC)    Target wt - 185  for BMI < 30   Obesity    OSA on CPAP    uses with 2L O2   Pneumonia    PONV (postoperative nausea and vomiting)    SBO (small bowel obstruction) (HCC)    Resolved with NG/Bowel rest around 2009   Sleep apnea    Type II or unspecified type diabetes mellitus without mention of complication, not stated as uncontrolled     Medications Prior to Admission  Medication Sig Dispense Refill   albuterol  (VENTOLIN  HFA) 108 (90 Base) MCG/ACT inhaler Inhale 2 puffs into the lungs every 6 (six) hours as needed for wheezing or shortness of breath. 8 g 11   ALPRAZolam  (XANAX ) 0.25 MG tablet Take 1 tablet (0.25 mg total) by mouth 3 (three) times daily as needed for anxiety. 270 tablet 0   amiodarone  (PACERONE ) 200 MG tablet Take 1 tablet (200 mg total) by mouth daily. 90 tablet 0   atorvastatin  (LIPITOR) 40 MG tablet TAKE ONE TABLET BY MOUTH ONE TIME DAILY 90 tablet 1   Blood Glucose Monitoring Suppl (ONETOUCH VERIO FLEX  SYSTEM) w/Device KIT 1 Act by Does not apply route in the morning, at noon, and at bedtime. 1 kit 2   Blood Glucose Monitoring Suppl DEVI 1 each by Does not apply route as directed. Dispense based on patient and insurance preference. Use up to four times daily as directed. (FOR ICD-10 E10.9, E11.9). 1 each 0   Continuous Glucose Sensor (FREESTYLE LIBRE 3 PLUS SENSOR) MISC Apply 1 Act topically every 14 (fourteen) days. Change sensor every 15 days. 6 each 1   cyanocobalamin  (VITAMIN B12) 1000 MCG/ML injection INJECT 1ML INTO THE MUSCLE ONCE A MONTH 10 mL 0   ELIQUIS  5 MG TABS tablet TAKE ONE TABLET BY MOUTH TWICE A DAY 180 tablet 1   fluticasone  (FLONASE ) 50 MCG/ACT nasal spray Place 1 spray into both nostrils daily. 18 mL 2   fluticasone  furoate-vilanterol (BREO ELLIPTA ) 100-25 MCG/ACT AEPB Inhale 1 puff into the lungs daily. 60 each 10   furosemide  (LASIX ) 20 MG tablet Take 20 mg by mouth daily.     Glucose Blood (BLOOD GLUCOSE TEST STRIPS) STRP 1 each by Does not apply route as directed. Dispense based on patient and insurance preference. Use up to four times daily as directed. (FOR ICD-10 E10.9, E11.9). 100 strip 0   insulin  degludec (TRESIBA  FLEXTOUCH) 200 UNIT/ML FlexTouch  Pen Inject 20 Units into the skin daily. 9 mL 1   Insulin  Pen Needle 32G X 6 MM MISC 1 Act by Does not apply route once a week. 100 each 1   Lancet Device MISC 1 each by Does not apply route as directed. Dispense based on patient and insurance preference. Use up to four times daily as directed. (FOR ICD-10 E10.9, E11.9). 1 each 0   Lancets MISC 1 each by Does not apply route as directed. Dispense based on patient and insurance preference. Use up to four times daily as directed. (FOR ICD-10 E10.9, E11.9). 100 each 0   levothyroxine  (SYNTHROID ) 150 MCG tablet TAKE ONE TABLET BY MOUTH ONCE DAILY 90 tablet 0   losartan  (COZAAR ) 25 MG tablet Take 25 mg by mouth daily.     metFORMIN  (GLUCOPHAGE -XR) 750 MG 24 hr tablet TAKE ONE TABLET  BY MOUTH ONE TIME DAILY WITH BREAKFAST 90 tablet 1   OXYGEN  Inhale 2 L into the lungs at bedtime. Uses with CPAP     PARoxetine  (PAXIL ) 20 MG tablet Take 1 tablet (20 mg total) by mouth daily. 90 tablet 1   Semaglutide ,0.25 or 0.5MG /DOS, (OZEMPIC , 0.25 OR 0.5 MG/DOSE,) 2 MG/3ML SOPN Inject 0.25 mg into the skin once a week. 3 mL 0   torsemide  (DEMADEX ) 20 MG tablet Take 20 mg by mouth as needed (Edema in legs).        ALPRAZolam   0.25 mg Oral TID   amiodarone   100 mg Oral Daily   apixaban   5 mg Oral BID   atorvastatin   40 mg Oral Daily   dexamethasone   4 mg Oral Q6H   fluticasone   1 spray Each Nare Daily   fluticasone  furoate-vilanterol  1 puff Inhalation Daily   furosemide   20 mg Oral Daily   insulin  aspart  0-15 Units Subcutaneous TID WC   insulin  aspart  0-5 Units Subcutaneous QHS   ketorolac   15 mg Intravenous Q6H   levothyroxine   150 mcg Oral Q0600   lidocaine   1 patch Transdermal Q24H   losartan   25 mg Oral Daily   PARoxetine   20 mg Oral Daily   sodium chloride  flush  3 mL Intravenous Q12H    Infusions:  sodium chloride  75 mL/hr at 01/05/24 1137    Allergies  Allergen Reactions   Doxycycline  Rash   Factive [Gemifloxacin Mesylate] Rash   Sulfonamide Derivatives Rash    REACTION: rash   Crestor  [Rosuvastatin ]     GI upset   Gemifloxacin Rash   Penicillins Hives and Rash    Has patient had a PCN reaction causing immediate rash, facial/tongue/throat swelling, SOB or lightheadedness with hypotension: No Has patient had a PCN reaction causing severe rash involving mucus membranes or skin necrosis: No Has patient had a PCN reaction that required hospitalization No Has patient had a PCN reaction occurring within the last 10 years: No If all of the above answers are NO, then may proceed with Cephalosporin use.     Social History   Socioeconomic History   Marital status: Married    Spouse name: Lynwood   Number of children: 3   Years of education: Not on file    Highest education level: Not on file  Occupational History   Occupation: Retired    Comment: Receptionist at [employer]  Tobacco Use   Smoking status: Never   Smokeless tobacco: Never   Tobacco comments:    tried as a teen  Advertising Account Planner   Vaping status:  Never Used  Substance and Sexual Activity   Alcohol use: Never    Alcohol/week: 2.0 standard drinks of alcohol    Types: 2 Glasses of wine per week   Drug use: No   Sexual activity: Yes    Birth control/protection: Surgical  Other Topics Concern   Not on file  Social History Narrative   Regular Exercise -  YES   Daily Caffeine  Use:  2   Active and independent at baseline   Right handed   Lives in a one story home with spouse/2025      Social Drivers of Health   Financial Resource Strain: Low Risk  (09/16/2023)   Overall Financial Resource Strain (CARDIA)    Difficulty of Paying Living Expenses: Not very hard  Food Insecurity: No Food Insecurity (01/05/2024)   Hunger Vital Sign    Worried About Running Out of Food in the Last Year: Never true    Ran Out of Food in the Last Year: Never true  Transportation Needs: No Transportation Needs (01/05/2024)   PRAPARE - Administrator, Civil Service (Medical): No    Lack of Transportation (Non-Medical): No  Physical Activity: Insufficiently Active (09/16/2023)   Exercise Vital Sign    Days of Exercise per Week: 7 days    Minutes of Exercise per Session: 20 min  Stress: No Stress Concern Present (09/16/2023)   [employer] of Occupational Health - Occupational Stress Questionnaire    Feeling of Stress: Not at all  Social Connections: Unknown (01/05/2024)   Social Connection and Isolation Panel    Frequency of Communication with Friends and Family: Twice a week    Frequency of Social Gatherings with Friends and Family: Twice a week    Attends Religious Services: Never    Database Administrator or Organizations: Patient unable to answer    Attends Tax Inspector Meetings: Patient unable to answer    Marital Status: Married  Catering Manager Violence: Not At Risk (01/05/2024)   Humiliation, Afraid, Rape, and Kick questionnaire    Fear of Current or Ex-Partner: No    Emotionally Abused: No    Physically Abused: No    Sexually Abused: No    Family History  Problem Relation Age of Onset   Lymphoma Mother    Diabetes type II Father    Colon cancer Father    Stomach cancer Father    Diabetes Maternal Grandmother    Diabetes type I Son    Goiter Neg Hx    Breast cancer Neg Hx    Ovarian cancer Neg Hx    Esophageal cancer Neg Hx    Pancreatic cancer Neg Hx     PHYSICAL EXAM: Vitals:   01/05/24 0452 01/05/24 0805  BP: (!) 148/79 108/66  Pulse: 79 71  Resp:  16  Temp: 98.1 F (36.7 C) 98.1 F (36.7 C)  SpO2: 93% 95%     Intake/Output Summary (Last 24 hours) at 01/05/2024 1211 Last data filed at 01/05/2024 1036 Gross per 24 hour  Intake 1371.25 ml  Output --  Net 1371.25 ml    General:  Well appearing. No respiratory difficulty HEENT: normal Neck: supple. no JVD. Carotids 2+ bilat; no bruits. No lymphadenopathy or thryomegaly appreciated. Cor: PMI nondisplaced. Regular rate & rhythm. No rubs, gallops or murmurs. Lungs: clear Abdomen: soft, nontender, nondistended. No hepatosplenomegaly. No bruits or masses. Good bowel sounds. Extremities: no cyanosis, clubbing, rash, edema Neuro: alert & oriented x 3, cranial nerves grossly  intact. moves all 4 extremities w/o difficulty. Affect pleasant.  ECG: Sinus rhythm 85 bpm with left bundle branch block which is not new.    Results for orders placed or performed during the hospital encounter of 01/04/24 (from the past 24 hours)  Comprehensive metabolic panel     Status: Abnormal   Collection Time: 01/04/24  3:28 PM  Result Value Ref Range   Sodium 138 135 - 145 mmol/L   Potassium 4.6 3.5 - 5.1 mmol/L   Chloride 102 98 - 111 mmol/L   CO2 25 22 - 32 mmol/L   Glucose, Bld  275 (H) 70 - 99 mg/dL   BUN 16 8 - 23 mg/dL   Creatinine, Ser 9.15 0.44 - 1.00 mg/dL   Calcium  10.0 8.9 - 10.3 mg/dL   Total Protein 7.2 6.5 - 8.1 g/dL   Albumin 4.4 3.5 - 5.0 g/dL   AST 20 15 - 41 U/L   ALT 14 0 - 44 U/L   Alkaline Phosphatase 105 38 - 126 U/L   Total Bilirubin 0.3 0.0 - 1.2 mg/dL   GFR, Estimated >39 >39 mL/min   Anion gap 11 5 - 15  CBC     Status: None   Collection Time: 01/04/24  3:28 PM  Result Value Ref Range   WBC 8.0 4.0 - 10.5 K/uL   RBC 4.69 3.87 - 5.11 MIL/uL   Hemoglobin 13.6 12.0 - 15.0 g/dL   HCT 57.4 63.9 - 53.9 %   MCV 90.6 80.0 - 100.0 fL   MCH 29.0 26.0 - 34.0 pg   MCHC 32.0 30.0 - 36.0 g/dL   RDW 86.6 88.4 - 84.4 %   Platelets 257 150 - 400 K/uL   nRBC 0.0 0.0 - 0.2 %  Troponin T, High Sensitivity     Status: None   Collection Time: 01/04/24  3:28 PM  Result Value Ref Range   Troponin T High Sensitivity 18 0 - 19 ng/L  Magnesium      Status: None   Collection Time: 01/04/24  3:28 PM  Result Value Ref Range   Magnesium  2.0 1.7 - 2.4 mg/dL  TSH     Status: None   Collection Time: 01/04/24  3:28 PM  Result Value Ref Range   TSH 0.936 0.350 - 4.500 uIU/mL  Pro Brain natriuretic peptide     Status: Abnormal   Collection Time: 01/04/24  3:28 PM  Result Value Ref Range   Pro Brain Natriuretic Peptide 633.0 (H) <300.0 pg/mL  APTT     Status: None   Collection Time: 01/04/24  3:50 PM  Result Value Ref Range   aPTT 33 24 - 36 seconds  Type and screen [facility]     Status: None   Collection Time: 01/04/24  3:50 PM  Result Value Ref Range   ABO/RH(D) O POS    Antibody Screen NEG    Sample Expiration      01/07/2024,2359 Performed at [facility], [address] [place], [place] [address]   Glucose, capillary     Status: Abnormal   Collection Time: 01/04/24  8:51 PM  Result Value Ref Range   Glucose-Capillary 151 (H) 70 - 99 mg/dL  Troponin T, High Sensitivity     Status: Abnormal   Collection Time:  01/04/24  8:58 PM  Result Value Ref Range   Troponin T High Sensitivity 25 (H) 0 - 19 ng/L  HIV Antibody (routine testing w rflx)     Status:  None   Collection Time: 01/04/24  8:58 PM  Result Value Ref Range   HIV Screen 4th Generation wRfx Non Reactive Non Reactive  CBC     Status: Abnormal   Collection Time: 01/05/24  5:27 AM  Result Value Ref Range   WBC 12.3 (H) 4.0 - 10.5 K/uL   RBC 4.19 3.87 - 5.11 MIL/uL   Hemoglobin 12.0 12.0 - 15.0 g/dL   HCT 59.6 63.9 - 53.9 %   MCV 96.2 80.0 - 100.0 fL   MCH 28.6 26.0 - 34.0 pg   MCHC 29.8 (L) 30.0 - 36.0 g/dL   RDW 86.2 88.4 - 84.4 %   Platelets 290 150 - 400 K/uL   nRBC 0.0 0.0 - 0.2 %  Comprehensive metabolic panel     Status: Abnormal   Collection Time: 01/05/24  5:27 AM  Result Value Ref Range   Sodium 142 135 - 145 mmol/L   Potassium 4.0 3.5 - 5.1 mmol/L   Chloride 106 98 - 111 mmol/L   CO2 29 22 - 32 mmol/L   Glucose, Bld 140 (H) 70 - 99 mg/dL   BUN 14 8 - 23 mg/dL   Creatinine, Ser 9.13 0.44 - 1.00 mg/dL   Calcium  9.3 8.9 - 10.3 mg/dL   Total Protein 6.4 (L) 6.5 - 8.1 g/dL   Albumin 4.1 3.5 - 5.0 g/dL   AST 19 15 - 41 U/L   ALT 13 0 - 44 U/L   Alkaline Phosphatase 84 38 - 126 U/L   Total Bilirubin 0.3 0.0 - 1.2 mg/dL   GFR, Estimated >39 >39 mL/min   Anion gap 7 5 - 15  Glucose, capillary     Status: Abnormal   Collection Time: 01/05/24  8:13 AM  Result Value Ref Range   Glucose-Capillary 149 (H) 70 - 99 mg/dL  Glucose, capillary     Status: Abnormal   Collection Time: 01/05/24 11:20 AM  Result Value Ref Range   Glucose-Capillary 105 (H) 70 - 99 mg/dL   MR BRAIN WO CONTRAST Result Date: 01/04/2024 EXAM: MRI BRAIN WITH CONTRAST MRI CERVICAL SPINE WITHOUT CONTRAST 01/04/2024 10:51:13 PM TECHNIQUE: Multiplanar multisequence MRI of the brain was performed with the administration of 9 mL gadobutrol  (GADAVIST ) 1 MMOL/ML intravenous contrast. Multiplanar multisequence MRI of the cervical spine was performed without the  administration of intravenous contrast. COMPARISON: Brain MRI dated 05/26/2021. Cervical spine MRI dated 01/28/2020. CLINICAL HISTORY: Syncope/presyncope, cerebrovascular cause suspected. FINDINGS: MRI BRAIN: BRAIN AND VENTRICLES: Multifocal hyperintense T2-weighted signal within the cerebral white matter, most commonly due to chronic small vessel disease. No acute infarct. No acute intracranial hemorrhage. No mass or abnormal enhancement. No midline shift. No hydrocephalus. The sella is unremarkable. Normal flow voids. ORBITS: No acute abnormality. SINUSES AND MASTOIDS: No acute abnormality. BONES AND SOFT TISSUES: Small right frontal scalp hematoma. Normal bone marrow signal. MRI CERVICAL SPINE: BONES AND ALIGNMENT: Reversal of normal cervical lordosis. Normal vertebral body heights. Marrow signal is unremarkable. No abnormal enhancement. SPINAL CORD: Normal spinal cord size. Normal spinal cord signal. SOFT TISSUES: Unremarkable. C2-C3: Severe left facet hypertrophy with moderate left foraminal stenosis unchanged. C3-C4: Mild left facet hypertrophy. C4-C5: Mild right uncovertebral spurring. C5-C6: Right greater than left uncovertebral hypertrophy with right foraminal disc protrusion, moderate spinal canal stenosis, and severe right foraminal stenosis. C6-C7: Small disc bulge with uncovertebral hypertrophy and mild spinal canal stenosis. C7-T1: No significant disc herniation. No spinal canal stenosis or neural foraminal narrowing. IMPRESSION: 1. No acute intracranial abnormality. 2.  Multifocal hyperintense T2-weighted signal within the cerebral white matter, most commonly due to chronic small vessel disease. 3. Small right frontal scalp hematoma. 4. Severe left facet hypertrophy with moderate left foraminal stenosis at C3-4, unchanged. 5. Right greater than left uncovertebral hypertrophy with right foraminal disc protrusion, moderate spinal canal stenosis, and severe right foraminal stenosis at C5-6, unchanged.  Electronically signed by: Franky Stanford MD 01/04/2024 11:07 PM EST RP Workstation: [id number]   MR CERVICAL SPINE W WO CONTRAST Result Date: 01/04/2024 EXAM: MRI BRAIN WITH CONTRAST MRI CERVICAL SPINE WITHOUT CONTRAST 01/04/2024 10:51:13 PM TECHNIQUE: Multiplanar multisequence MRI of the brain was performed with the administration of 9 mL gadobutrol  (GADAVIST ) 1 MMOL/ML intravenous contrast. Multiplanar multisequence MRI of the cervical spine was performed without the administration of intravenous contrast. COMPARISON: Brain MRI dated 05/26/2021. Cervical spine MRI dated 01/28/2020. CLINICAL HISTORY: Syncope/presyncope, cerebrovascular cause suspected. FINDINGS: MRI BRAIN: BRAIN AND VENTRICLES: Multifocal hyperintense T2-weighted signal within the cerebral white matter, most commonly due to chronic small vessel disease. No acute infarct. No acute intracranial hemorrhage. No mass or abnormal enhancement. No midline shift. No hydrocephalus. The sella is unremarkable. Normal flow voids. ORBITS: No acute abnormality. SINUSES AND MASTOIDS: No acute abnormality. BONES AND SOFT TISSUES: Small right frontal scalp hematoma. Normal bone marrow signal. MRI CERVICAL SPINE: BONES AND ALIGNMENT: Reversal of normal cervical lordosis. Normal vertebral body heights. Marrow signal is unremarkable. No abnormal enhancement. SPINAL CORD: Normal spinal cord size. Normal spinal cord signal. SOFT TISSUES: Unremarkable. C2-C3: Severe left facet hypertrophy with moderate left foraminal stenosis unchanged. C3-C4: Mild left facet hypertrophy. C4-C5: Mild right uncovertebral spurring. C5-C6: Right greater than left uncovertebral hypertrophy with right foraminal disc protrusion, moderate spinal canal stenosis, and severe right foraminal stenosis. C6-C7: Small disc bulge with uncovertebral hypertrophy and mild spinal canal stenosis. C7-T1: No significant disc herniation. No spinal canal stenosis or neural foraminal narrowing. IMPRESSION: 1. No  acute intracranial abnormality. 2. Multifocal hyperintense T2-weighted signal within the cerebral white matter, most commonly due to chronic small vessel disease. 3. Small right frontal scalp hematoma. 4. Severe left facet hypertrophy with moderate left foraminal stenosis at C3-4, unchanged. 5. Right greater than left uncovertebral hypertrophy with right foraminal disc protrusion, moderate spinal canal stenosis, and severe right foraminal stenosis at C5-6, unchanged. Electronically signed by: Franky Stanford MD 01/04/2024 11:07 PM EST RP Workstation: [id number]   CT Head Wo Contrast Result Date: 01/04/2024 EXAM: CT HEAD WITHOUT CONTRAST 01/04/2024 04:52:21 PM TECHNIQUE: CT of the head was performed without the administration of intravenous contrast. Automated exposure control, iterative reconstruction, and/or weight based adjustment of the mA/kV was utilized to reduce the radiation dose to as low as reasonably achievable. COMPARISON: Comparison with CT head without contrast 09/16/2021. CLINICAL HISTORY: Fall, on Eliquis . FINDINGS: BRAIN AND VENTRICLES: No acute hemorrhage. No evidence of acute infarct. No hydrocephalus. No extra-axial collection. No mass effect or midline shift. Mild periventricular chronic small vessel ischemia. ORBITS: Bilateral cataract resection noted. SINUSES: No acute abnormality. SOFT TISSUES AND SKULL: Right frontal scalp hematoma. No underlying fracture or radiopaque foreign body. Atherosclerosis of skullbase vasculature. IMPRESSION: 1. No acute intracranial abnormality. 2. Right frontal scalp hematoma without underlying fracture or radiopaque foreign body. Electronically signed by: Lonni Necessary MD 01/04/2024 05:07 PM EST RP Workstation: [id number]   CT Cervical Spine Wo Contrast Result Date: 01/04/2024 EXAM: CT CERVICAL SPINE WITHOUT CONTRAST 01/04/2024 04:52:21 PM TECHNIQUE: CT of the cervical spine was performed without the administration of intravenous contrast. Multiplanar  reformatted images are provided for review.  Automated exposure control, iterative reconstruction, and/or weight based adjustment of the mA/kV was utilized to reduce the radiation dose to as low as reasonably achievable. COMPARISON: CT cervical spine without contrast 12/19/2021. CLINICAL HISTORY: Syncopal episode. Trauma to head. FINDINGS: CERVICAL SPINE: BONES AND ALIGNMENT: No acute fracture or traumatic malalignment. DEGENERATIVE CHANGES: Asymmetric left-sided facet hypertrophy is present at C2-C3, C3-C4, and C7-T1. Moderate left foraminal stenosis is present at C2-C3 and C7-T1. Uncovertebral spurring contributes to moderate foraminal narrowing bilaterally at C6-C7, right greater than left. SOFT TISSUES: No prevertebral soft tissue swelling. Atherosclerotic calcifications are present at the left carotid bifurcation without significant stenosis. IMPRESSION: 1. No acute abnormality of the cervical spine related to the reported fall. 2. Moderate multilevel degenerative changes with foraminal stenosis as detailed, without severe central canal stenosis. Electronically signed by: Lonni Necessary MD 01/04/2024 05:05 PM EST RP Workstation: [id number]   CT Angio Chest PE W and/or Wo Contrast Result Date: 01/04/2024 CLINICAL DATA:  Chest pain.  Concern for pulmonary embolism. EXAM: CT ANGIOGRAPHY CHEST WITH CONTRAST TECHNIQUE: Multidetector CT imaging of the chest was performed using the standard protocol during bolus administration of intravenous contrast. Multiplanar CT image reconstructions and MIPs were obtained to evaluate the vascular anatomy. RADIATION DOSE REDUCTION: This exam was performed according to the departmental dose-optimization program which includes automated exposure control, adjustment of the mA and/or kV according to patient size and/or use of iterative reconstruction technique. CONTRAST:  75mL OMNIPAQUE  IOHEXOL  350 MG/ML SOLN COMPARISON:  Chest CT dated 02/01/2023. FINDINGS: Cardiovascular:  Borderline cardiomegaly. No pericardial effusion. The thoracic aorta is unremarkable. The origins of the great vessels of the aortic arch are patent. Evaluation of the pulmonary arteries is limited due to respiratory motion. No pulmonary artery embolus identified. Mediastinum/Nodes: No hilar or mediastinal adenopathy. The esophagus is grossly unremarkable no mediastinal fluid collection. Lungs/Pleura: Right posterior diaphragmatic defect with herniation of abdominal fat. There are bibasilar atelectasis. No consolidative changes. There is no pleural effusion or pneumothorax. The central airways are patent. Upper Abdomen: No acute abnormality. Musculoskeletal: Osteopenia.  No acute osseous pathology. Review of the MIP images confirms the above findings. IMPRESSION: No acute intrathoracic pathology. No CT evidence of pulmonary artery embolus. Electronically Signed   By: Vanetta Chou M.D.   On: 01/04/2024 17:03     ASSESSMENT AND PLAN: #1 syncope with prior history of paroxysmal atrial fibrillation and cervical radiculopathy.  Patient was having severe neck pain prior to this episode and could be due to vasovagal syncope.  Agree with admitting the patient and observe for any arrhythmias but remains in sinus rhythm with baseline left bundle branch block unchanged from prior EKGs.  Initial troponins are unremarkable along with the EKG.  Advise getting a carotid Doppler Holter monitor as outpatient.  Patient seem to have been well-hydrated prior to this episode.  Will look at echocardiogram to make further recommendation.  Thank you very much for referral. #2 atrial fibrillation with documented left bundle branch block, continue amiodarone  100 mg once a day and Eliquis  5 mg p.o. twice daily Kirsten [name]

## 2024-01-05 NOTE — Progress Notes (Signed)
 Pt requested to use her CPAP. MD was notified and requested an order. Awaiting results.  Pt also requested that after Mri, she is experiencing increased anxiety and would like anti-anxiety medication. On call MD notified.

## 2024-01-06 ENCOUNTER — Inpatient Hospital Stay: Admit: 2024-01-06 | Discharge: 2024-01-06 | Disposition: A | Attending: Internal Medicine | Admitting: Internal Medicine

## 2024-01-06 ENCOUNTER — Telehealth: Payer: Self-pay

## 2024-01-06 DIAGNOSIS — I480 Paroxysmal atrial fibrillation: Secondary | ICD-10-CM

## 2024-01-06 DIAGNOSIS — M47812 Spondylosis without myelopathy or radiculopathy, cervical region: Secondary | ICD-10-CM | POA: Diagnosis not present

## 2024-01-06 DIAGNOSIS — E039 Hypothyroidism, unspecified: Secondary | ICD-10-CM | POA: Diagnosis not present

## 2024-01-06 DIAGNOSIS — R55 Syncope and collapse: Secondary | ICD-10-CM | POA: Diagnosis not present

## 2024-01-06 LAB — ECHOCARDIOGRAM COMPLETE
AR max vel: 3.06 cm2
AV Area VTI: 3.21 cm2
AV Area mean vel: 2.69 cm2
AV Mean grad: 2 mmHg
AV Peak grad: 3.5 mmHg
Ao pk vel: 0.93 m/s
Area-P 1/2: 4.29 cm2
Height: 67 in
MV VTI: 2.42 cm2
S' Lateral: 3 cm
Weight: 3407.43 [oz_av]

## 2024-01-06 LAB — GLUCOSE, CAPILLARY
Glucose-Capillary: 236 mg/dL — ABNORMAL HIGH (ref 70–99)
Glucose-Capillary: 216 mg/dL — ABNORMAL HIGH (ref 70–99)
Glucose-Capillary: 221 mg/dL — ABNORMAL HIGH (ref 70–99)
Glucose-Capillary: 252 mg/dL — ABNORMAL HIGH (ref 70–99)
Glucose-Capillary: 266 mg/dL — ABNORMAL HIGH (ref 70–99)

## 2024-01-06 MED ORDER — CYCLOBENZAPRINE HCL 10 MG PO TABS
5.0000 mg | ORAL_TABLET | Freq: Three times a day (TID) | ORAL | Status: DC
  Administered 2024-01-06: 5 mg via ORAL
  Filled 2024-01-06: qty 1

## 2024-01-06 MED ORDER — CYCLOBENZAPRINE HCL 10 MG PO TABS
5.0000 mg | ORAL_TABLET | Freq: Three times a day (TID) | ORAL | Status: DC | PRN
Start: 2024-01-06 — End: 2024-01-07
  Administered 2024-01-07: 5 mg via ORAL
  Filled 2024-01-06: qty 1

## 2024-01-06 MED ORDER — MUSCLE RUB 10-15 % EX CREA
TOPICAL_CREAM | Freq: Every day | CUTANEOUS | Status: DC
  Filled 2024-01-06: qty 85

## 2024-01-06 NOTE — Consult Note (Signed)
 Neurosurgery-New Consultation Evaluation 01/06/2024 AYEZA THERRIAULT [id number]  Identifying Statement: ERLA BACCHI is a 68 y.o. female from [place] [address] with a history of A fib, anxiety, HTN, GERD, HLD, hypothyroidism, NASH cirrhosis, morbid obesity, OSA, DM presenting with neck pain and radiculopathy for severe days.  Physician Requesting Consultation: No ref. provider found  History of Present Illness: Mayrene Bastarache is a 68 y.o presenting with a neck pain for several days and a syncopal episode.  Today she reports right sided neck and shoulder pain that started on 11/18 upon waking up with any inciting event. She was seen at [facility] and while there she turned her head and experienced a syncopal event, striking her head and right arm resulting in hospitalization.  Today she reports ongoing right sided neck pain and stiffness that is significantly worse with rotation. She denies any pain, numbness, or tingling radiating down her arms. She is currently being given steroids, Flexeril , Toradol , lidoderm , and Oxycodone  for the pain which has provided minimal improvement.    Past Medical History:  Past Medical History:  Diagnosis Date   A-fib (HCC)    Anxiety disorder    Arthritis    Asthma    Gastroschisis    umphalocele, rotated organs abd repair until age 66   GERD (gastroesophageal reflux disease)    HTN (hypertension)    Hyperlipidemia    Hypothyroidism    IBS (irritable bowel syndrome)    Morbid obesity (HCC)    Target wt - 185  for BMI < 30   Obesity    OSA on CPAP    uses with 2L O2   Pneumonia    PONV (postoperative nausea and vomiting)    SBO (small bowel obstruction) (HCC)    Resolved with NG/Bowel rest around 2009   Sleep apnea    Type II or unspecified type diabetes mellitus without mention of complication, not stated as uncontrolled     Social History: Social History   Socioeconomic History   Marital status: Married    Spouse name: Lynwood    Number of children: 3   Years of education: Not on file   Highest education level: Not on file  Occupational History   Occupation: Retired    Comment: Scientist, Physiological at [employer]  Tobacco Use   Smoking status: Never   Smokeless tobacco: Never   Tobacco comments:    tried as a teen  Advertising Account Planner   Vaping status: Never Used  Substance and Sexual Activity   Alcohol use: Never    Alcohol/week: 2.0 standard drinks of alcohol    Types: 2 Glasses of wine per week   Drug use: No   Sexual activity: Yes    Birth control/protection: Surgical  Other Topics Concern   Not on file  Social History Narrative   Regular Exercise -  YES   Daily Caffeine  Use:  2   Active and independent at baseline   Right handed   Lives in a one story home with spouse/2025      Social Drivers of Health   Financial Resource Strain: Low Risk  (09/16/2023)   Overall Financial Resource Strain (CARDIA)    Difficulty of Paying Living Expenses: Not very hard  Food Insecurity: No Food Insecurity (01/05/2024)   Hunger Vital Sign    Worried About Running Out of Food in the Last Year: Never true    Ran Out of Food in the Last Year: Never true  Transportation Needs: No Transportation Needs (  01/05/2024)   PRAPARE - Administrator, Civil Service (Medical): No    Lack of Transportation (Non-Medical): No  Physical Activity: Insufficiently Active (09/16/2023)   Exercise Vital Sign    Days of Exercise per Week: 7 days    Minutes of Exercise per Session: 20 min  Stress: No Stress Concern Present (09/16/2023)   [employer] of Occupational Health - Occupational Stress Questionnaire    Feeling of Stress: Not at all  Social Connections: Unknown (01/05/2024)   Social Connection and Isolation Panel    Frequency of Communication with Friends and Family: Twice a week    Frequency of Social Gatherings with Friends and Family: Twice a week    Attends Religious Services: Never    Database Administrator or  Organizations: Patient unable to answer    Attends Banker Meetings: Patient unable to answer    Marital Status: Married  Catering Manager Violence: Not At Risk (01/05/2024)   Humiliation, Afraid, Rape, and Kick questionnaire    Fear of Current or Ex-Partner: No    Emotionally Abused: No    Physically Abused: No    Sexually Abused: No   Living arrangements (living alone, with partner): husband  Family History: Family History  Problem Relation Age of Onset   Lymphoma Mother    Diabetes type II Father    Colon cancer Father    Stomach cancer Father    Diabetes Maternal Grandmother    Diabetes type I Son    Goiter Neg Hx    Breast cancer Neg Hx    Ovarian cancer Neg Hx    Esophageal cancer Neg Hx    Pancreatic cancer Neg Hx     Review of Systems:  Review of Systems - General ROS: Negative Psychological ROS: Negative Ophthalmic ROS: Negative ENT ROS: Negative Hematological and Lymphatic ROS: Negative  Endocrine ROS: Negative Respiratory ROS: Negative Cardiovascular ROS: Negative Gastrointestinal ROS: Negative Genito-Urinary ROS: Negative Musculoskeletal ROS: Negative Neurological ROS: Negative Dermatological ROS: Negative  Physical Exam: BP 119/72 (BP Location: Right Arm)   Pulse 70   Temp 98.9 F (37.2 C)   Resp 16   Ht 5' 7 (1.702 m)   Wt 96.6 kg   SpO2 94%   BMI 33.35 kg/m  Body mass index is 33.35 kg/m. Body surface area is 2.14 meters squared. General appearance: Alert, cooperative, in no acute distress Head: Normocephalic, with right frontal hematoma.  Eyes: Normal, EOM intact Oropharynx: Moist without lesions Neck: TTP over right sided cervical paraspinal muscles.  Heart: Normal, regular rate and rhythm, without murmur Lungs:  good air exchange Abdomen: Soft, nondistended Ext: No edema in LE bilaterally, good distal pulses  Neurologic exam:  Mental status: alertness: alert, orientation: person, place, time, affect: normal Speech:  fluent and clear Cranial nerves:  Grossly intact  Motor:strength symmetric 5/5, normal muscle mass and tone in all extremities and no pronator drift Sensory: intact to light touch in all extremities Reflexes: 2+ and symmetric bilaterally for arms and legs Coordination: intact finger to nose Gait: untested   Laboratory: Results for orders placed or performed during the hospital encounter of 01/04/24  Urinalysis, Routine w reflex microscopic -Urine, Clean Catch   Collection Time: 01/04/24  3:19 PM  Result Value Ref [place]   Color, Urine STRAW (A) YELLOW   APPearance CLEAR (A) CLEAR   Specific Gravity, Urine 1.010 1.005 - 1.030   pH 5.0 5.0 - 8.0   Glucose, UA NEGATIVE NEGATIVE mg/dL  Hgb urine dipstick NEGATIVE NEGATIVE   Bilirubin Urine NEGATIVE NEGATIVE   Ketones, ur NEGATIVE NEGATIVE mg/dL   Protein, ur NEGATIVE NEGATIVE mg/dL   Nitrite NEGATIVE NEGATIVE   Leukocytes,Ua NEGATIVE NEGATIVE  Comprehensive metabolic panel   Collection Time: 01/04/24  3:28 PM  Result Value Ref [place]   Sodium 138 135 - 145 mmol/L   Potassium 4.6 3.5 - 5.1 mmol/L   Chloride 102 98 - 111 mmol/L   CO2 25 22 - 32 mmol/L   Glucose, Bld 275 (H) 70 - 99 mg/dL   BUN 16 8 - 23 mg/dL   Creatinine, Ser 9.15 0.44 - 1.00 mg/dL   Calcium  10.0 8.9 - 10.3 mg/dL   Total Protein 7.2 6.5 - 8.1 g/dL   Albumin 4.4 3.5 - 5.0 g/dL   AST 20 15 - 41 U/L   ALT 14 0 - 44 U/L   Alkaline Phosphatase 105 38 - 126 U/L   Total Bilirubin 0.3 0.0 - 1.2 mg/dL   GFR, Estimated >39 >39 mL/min   Anion gap 11 5 - 15  CBC   Collection Time: 01/04/24  3:28 PM  Result Value Ref [place]   WBC 8.0 4.0 - 10.5 K/uL   RBC 4.69 3.87 - 5.11 MIL/uL   Hemoglobin 13.6 12.0 - 15.0 g/dL   HCT 57.4 63.9 - 53.9 %   MCV 90.6 80.0 - 100.0 fL   MCH 29.0 26.0 - 34.0 pg   MCHC 32.0 30.0 - 36.0 g/dL   RDW 86.6 88.4 - 84.4 %   Platelets 257 150 - 400 K/uL   nRBC 0.0 0.0 - 0.2 %  Magnesium    Collection Time: 01/04/24  3:28 PM  Result Value Ref  Range   Magnesium  2.0 1.7 - 2.4 mg/dL  TSH   Collection Time: 01/04/24  3:28 PM  Result Value Ref Range   TSH 0.936 0.350 - 4.500 uIU/mL  Pro Brain natriuretic peptide   Collection Time: 01/04/24  3:28 PM  Result Value Ref Range   Pro Brain Natriuretic Peptide 633.0 (H) <300.0 pg/mL  Troponin T, High Sensitivity   Collection Time: 01/04/24  3:28 PM  Result Value Ref Range   Troponin T High Sensitivity 18 0 - 19 ng/L  APTT   Collection Time: 01/04/24  3:50 PM  Result Value Ref Range   aPTT 33 24 - 36 seconds  Type and screen [facility]   Collection Time: 01/04/24  3:50 PM  Result Value Ref [place]   ABO/RH(D) O POS    Antibody Screen NEG    Sample Expiration      01/07/2024,2359 Performed at [facility], [address] [place], [place] [address]   Glucose, capillary   Collection Time: 01/04/24  8:51 PM  Result Value Ref Range   Glucose-Capillary 151 (H) 70 - 99 mg/dL  HIV Antibody (routine testing w rflx)   Collection Time: 01/04/24  8:58 PM  Result Value Ref Range   HIV Screen 4th Generation wRfx Non Reactive Non Reactive  Troponin T, High Sensitivity   Collection Time: 01/04/24  8:58 PM  Result Value Ref [place]   Troponin T High Sensitivity 25 (H) 0 - 19 ng/L  CBC   Collection Time: 01/05/24  5:27 AM  Result Value Ref [place]   WBC 12.3 (H) 4.0 - 10.5 K/uL   RBC 4.19 3.87 - 5.11 MIL/uL   Hemoglobin 12.0 12.0 - 15.0 g/dL   HCT 59.6 63.9 - 53.9 %   MCV  96.2 80.0 - 100.0 fL   MCH 28.6 26.0 - 34.0 pg   MCHC 29.8 (L) 30.0 - 36.0 g/dL   RDW 86.2 88.4 - 84.4 %   Platelets 290 150 - 400 K/uL   nRBC 0.0 0.0 - 0.2 %  Comprehensive metabolic panel   Collection Time: 01/05/24  5:27 AM  Result Value Ref [place]   Sodium 142 135 - 145 mmol/L   Potassium 4.0 3.5 - 5.1 mmol/L   Chloride 106 98 - 111 mmol/L   CO2 29 22 - 32 mmol/L   Glucose, Bld 140 (H) 70 - 99 mg/dL   BUN 14 8 - 23 mg/dL   Creatinine, Ser 9.13 0.44 - 1.00 mg/dL   Calcium  9.3  8.9 - 10.3 mg/dL   Total Protein 6.4 (L) 6.5 - 8.1 g/dL   Albumin 4.1 3.5 - 5.0 g/dL   AST 19 15 - 41 U/L   ALT 13 0 - 44 U/L   Alkaline Phosphatase 84 38 - 126 U/L   Total Bilirubin 0.3 0.0 - 1.2 mg/dL   GFR, Estimated >39 >39 mL/min   Anion gap 7 5 - 15  Glucose, capillary   Collection Time: 01/05/24  8:13 AM  Result Value Ref Range   Glucose-Capillary 149 (H) 70 - 99 mg/dL  Glucose, capillary   Collection Time: 01/05/24 11:20 AM  Result Value Ref Range   Glucose-Capillary 105 (H) 70 - 99 mg/dL  Glucose, capillary   Collection Time: 01/05/24  4:49 PM  Result Value Ref Range   Glucose-Capillary 224 (H) 70 - 99 mg/dL  Glucose, capillary   Collection Time: 01/05/24  9:01 PM  Result Value Ref Range   Glucose-Capillary 165 (H) 70 - 99 mg/dL  Glucose, capillary   Collection Time: 01/06/24  4:33 AM  Result Value Ref Range   Glucose-Capillary 236 (H) 70 - 99 mg/dL  Glucose, capillary   Collection Time: 01/06/24  8:10 AM  Result Value Ref Range   Glucose-Capillary 216 (H) 70 - 99 mg/dL  ECHOCARDIOGRAM COMPLETE   Collection Time: 01/06/24  9:05 AM  Result Value Ref [place]   Weight 3,407.43 oz   Height 67 in   BP 135/38 mmHg   I personally reviewed labs  Imaging:  I personally reviewed radiology studies to include:  01/04/24 MRI CERVICAL SPINE: BONES AND ALIGNMENT: Reversal of normal cervical lordosis. Normal vertebral body heights. Marrow signal is unremarkable. No abnormal enhancement.   SPINAL CORD: Normal spinal cord size. Normal spinal cord signal.   SOFT TISSUES: Unremarkable.   C2-C3: Severe left facet hypertrophy with moderate left foraminal stenosis unchanged.   C3-C4: Mild left facet hypertrophy.   C4-C5: Mild right uncovertebral spurring.   C5-C6: Right greater than left uncovertebral hypertrophy with right foraminal disc protrusion, moderate spinal canal stenosis, and severe right foraminal stenosis.   C6-C7: Small disc bulge with  uncovertebral hypertrophy and mild spinal canal stenosis.   C7-T1: No significant disc herniation. No spinal canal stenosis or neural foraminal narrowing.   IMPRESSION: 1. No acute intracranial abnormality. 2. Multifocal hyperintense T2-weighted signal within the cerebral white matter, most commonly due to chronic small vessel disease. 3. Small right frontal scalp hematoma. 4. Severe left facet hypertrophy with moderate left foraminal stenosis at C3-4, unchanged. 5. Right greater than left uncovertebral hypertrophy with right foraminal disc protrusion, moderate spinal canal stenosis, and severe right foraminal stenosis at C5-6, unchanged.   Electronically signed by: Franky Stanford MD 01/04/2024 11:07 PM EST RP Workstation: [id number]  01/04/24 MRI BRAIN: BRAIN AND VENTRICLES: Multifocal hyperintense T2-weighted signal within the cerebral white matter, most commonly due to chronic small vessel disease. No acute infarct. No acute intracranial hemorrhage. No mass or abnormal enhancement. No midline shift. No hydrocephalus. The sella is unremarkable. Normal flow voids.   ORBITS: No acute abnormality.   SINUSES AND MASTOIDS: No acute abnormality.   BONES AND SOFT TISSUES: Small right frontal scalp hematoma. Normal bone marrow signal.   MRI CERVICAL SPINE: BONES AND ALIGNMENT: Reversal of normal cervical lordosis. Normal vertebral body heights. Marrow signal is unremarkable. No abnormal enhancement.   SPINAL CORD: Normal spinal cord size. Normal spinal cord signal.   SOFT TISSUES: Unremarkable.   C2-C3: Severe left facet hypertrophy with moderate left foraminal stenosis unchanged.   C3-C4: Mild left facet hypertrophy.   C4-C5: Mild right uncovertebral spurring.   C5-C6: Right greater than left uncovertebral hypertrophy with right foraminal disc protrusion, moderate spinal canal stenosis, and severe right foraminal stenosis.   C6-C7: Small disc bulge with  uncovertebral hypertrophy and mild spinal canal stenosis.   C7-T1: No significant disc herniation. No spinal canal stenosis or neural foraminal narrowing.   IMPRESSION: 1. No acute intracranial abnormality. 2. Multifocal hyperintense T2-weighted signal within the cerebral white matter, most commonly due to chronic small vessel disease. 3. Small right frontal scalp hematoma. 4. Severe left facet hypertrophy with moderate left foraminal stenosis at C3-4, unchanged. 5. Right greater than left uncovertebral hypertrophy with right foraminal disc protrusion, moderate spinal canal stenosis, and severe right foraminal stenosis at C5-6, unchanged.   Electronically signed by: Franky Stanford MD 01/04/2024 11:07 PM EST RP Workstation: [id number]   Impression/Plan:   Ms. Ingerson is a pleasant 68 y.o presenting with severe right sided neck pain without radiculopathy. She has palpable muscle spasms on the right paraspinal muscles with associated pain and decreased cervical rotation.    1.  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  Plan I had a long discussion with Mr. Conners regarding her symptoms and physical exam. I recommended short follow up with IR for cervical facet injections. We also discussed outpatient PT and consideration of dry needling. Dr. Clois sent a message to Dr. Thom Hall for assistance with short outpatient follow up for injection.  She is an established patient with [facility] for her neck and she is welcome to follow up with them should she prefer but we are more than happy to see her on an outpatient basis should she like. She may benefit from a steroid taper a discharge, continued Oxycodone  as needed, and consideration of changing Xanax  to Valium to assist with further muscle relaxation.   Please contact us  with any questions or concerns.    Edsel Goods PA-C Neurosurgery

## 2024-01-06 NOTE — TOC Initial Note (Signed)
 Transition of Care [facility]) - Initial/Assessment Note    Patient Details  [name] MRN: [id number] Date of Birth: [birth year]  Transition of Care [facility]) CM/SW Contact:    Alvaro Louder, LCSW Phone Number: 01/06/2024, 4:21 PM  Clinical Narrative:   Patient from home. PCP is Debby Molt. Patient has selected [facility]. They will follow at discharge.   TOC to follow for discharge.                      Patient Goals and CMS Choice            Expected Discharge Plan and Services                                              Prior Living Arrangements/Services                       Activities of Daily Living   ADL Screening (condition at time of admission) Independently performs ADLs?: Yes (appropriate for developmental age) Is the patient deaf or have difficulty hearing?: No Does the patient have difficulty seeing, even when wearing glasses/contacts?: No Does the patient have difficulty concentrating, remembering, or making decisions?: No  Permission Sought/Granted                  Emotional Assessment              Admission diagnosis:  Syncope and collapse [R55] Neck pain [M54.2] Hypoxia [R09.02] Syncope, unspecified syncope type [R55] Patient Active Problem List   Diagnosis Date Noted   Facet arthropathy, cervical 01/06/2024   Syncope and collapse 01/04/2024   Diabetes mellitus treated with insulin  (HCC) 12/18/2023   Recurrent major depressive disorder, in partial remission 11/18/2023   Esophageal dysphagia 10/10/2023   Type 2 diabetes mellitus with hyperglycemia, without long-term current use of insulin  (HCC) 07/25/2023   Recurrent UTI (urinary tract infection) 08/01/2022   Flu vaccine need 11/02/2021   Diuretic-induced hypokalemia 11/02/2021   Iron deficiency anemia 11/01/2021   Vitamin B12 deficiency anemia due to intrinsic factor deficiency 11/01/2021   Chronic respiratory failure with hypoxia (HCC) 10/17/2021    Encounter for general adult medical examination with abnormal findings 12/20/2020   Upper airway cough syndrome 10/28/2020   Spinal stenosis, lumbar region with neurogenic claudication 12/31/2019   DDD (degenerative disc disease), lumbar 08/11/2019   Primary osteoarthritis of right hip 08/11/2019   NASH (nonalcoholic steatohepatitis) 06/02/2019   Colon cancer screening 12/25/2017   Estrogen deficiency 12/25/2017   Pulmonary fibrosis (HCC)    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 03/21/2017   Insomnia w/ sleep apnea 03/21/2017   Class 1 obesity with serious comorbidity and body mass index (BMI) of 34.0 to 34.9 in adult 01/31/2016   Insomnia due to anxiety and fear 10/11/2014   GAD (generalized anxiety disorder) 10/11/2014   Paroxysmal atrial fibrillation (HCC) 10/04/2014   Neck pain 10/12/2013   Hyperlipidemia with target LDL less than 100 10/22/2012   Asthma, mild persistent 04/06/2011   Hypothyroidism 06/20/2009   Type II diabetes mellitus with manifestations (HCC) 04/08/2009   Essential hypertension, benign 04/08/2009   OSA on CPAP 04/08/2009   PCP:  Molt Debby CROME, MD Pharmacy:   [facility] - [place], [place] - [address] [place] [address] Phone:  [phone] Fax: [phone]  [facility] [address] [facility] - [place], [place] - [address] [facility] [address] [place] [address] Phone: [phone] Fax: [phone]     Social Drivers of Health (SDOH) Social History: SDOH Screenings   Food Insecurity: No Food Insecurity (01/05/2024)  Housing: Low Risk  (01/05/2024)  Transportation Needs: No Transportation Needs (01/05/2024)  Utilities: Not At Risk (01/05/2024)  Alcohol Screen: Low Risk  (09/16/2023)  Depression (PHQ2-9): Low Risk  (12/17/2023)  Financial Resource Strain: Low Risk  (09/16/2023)  Physical Activity: Insufficiently Active (09/16/2023)  Social Connections: Unknown (01/05/2024)  Stress: No Stress  Concern Present (09/16/2023)  Tobacco Use: Low Risk  (01/04/2024)  Health Literacy: Adequate Health Literacy (09/16/2023)   SDOH Interventions:     Readmission Risk Interventions     No data to display

## 2024-01-06 NOTE — Progress Notes (Signed)
 1      PROGRESS NOTE    Kirsten [name]  FMW:[id number] DOB: 1955-09-28 DOA: 01/04/2024 PCP: Joshua Debby CROME, MD   Brief Narrative:   68 y.o. female with medical history significant of atrial fibrillation, anxiety disorder, essential hypertension, GERD, hyperlipidemia, hypothyroidism, NASH cirrhosis morbid obesity, obstructive sleep apnea, type 2 diabetes, who started having significant neck pain and radiculopathy in the last few days   11/23: Neurosurgery and cardiology consult 11/24: Adding muscle rub cream, Flexeril  as she is still quite symptomatic   Assessment & Plan:   Principal Problem:   Syncope and collapse Active Problems:   OSA on CPAP   Hypothyroidism   Essential hypertension, benign   Paroxysmal atrial fibrillation (HCC)   Type II diabetes mellitus with manifestations (HCC)   Hyperlipidemia with target LDL less than 100   Neck pain   Insomnia w/ sleep apnea   NASH (nonalcoholic steatohepatitis)   Facet arthropathy, cervical   #1 syncope: Could be due to hypoxia.  She was hypoxic on arrival and also on recheck today.  Her oxygen  saturation dropped to mid 70s on minimal ambulation.  Patient is having significant cervical radiculopathy and could have had vasovagal syncope due to some neurologic release.    PT and OT recommends home health PT and OT Echocardiogram within normal limit, cardiology and appreciated -She remains in sinus rhythm with baseline left bundle branch block unchanged from prior EKGs. Initial troponins are unremarkable along with the EKG.    #2 cervical radiculopathy, spondylosis with facet arthropathy and associated torticollis Continue IV Toradol  and pain medicine/Percocet as needed.  Continue Decadron   - Appreciate neurosurgery input.  Discussed with interventional radiology by them.  Planning right C3-4 medial branch block soon as outpatient   #3 type 2 diabetes: Initiate sliding scale insulin .  Continue to monitor   #4 NASH cirrhosis:  Appears to be at baseline.  Continue to monitor   #5 hyperlipidemia: Continue with statin   #6 paroxysmal atrial fibrillation: In sinus rhythm now.  Continue amiodarone  for rate control, Eliquis  for anticoagulation   #7 essential hypertension: Continue Lasix , losartan  and amiodarone    #8 obstructive sleep apnea: Continue with CPAP   #9 hypothyroidism: Continue levothyroxine    #10 generalized anxiety disorder: Continue Xanax  and Paxil    DVT prophylaxis: Eliquis   apixaban  (ELIQUIS ) tablet 5 mg     Code Status: Full code Family Communication: Husband updated at bedside Disposition Plan: Possible discharge tomorrow depending on clinical condition, neurosurgery evaluation   Consultants:  Cardiology Neurosurgery   Subjective:  She remains uncomfortable due to her neck issues and having significant pain with any movement  Objective: Vitals:   01/06/24 0500 01/06/24 0810 01/06/24 1222 01/06/24 1618  BP:  119/72 (!) 147/63 (!) 130/52  Pulse:  70 78 73  Resp:  16 16   Temp:  98.9 F (37.2 C)  98.2 F (36.8 C)  TempSrc:      SpO2:  94% 96% 96%  Weight: 96.6 kg     Height:        Intake/Output Summary (Last 24 hours) at 01/06/2024 1623 Last data filed at 01/06/2024 1300 Gross per 24 hour  Intake 1345.48 ml  Output --  Net 1345.48 ml   Filed Weights   01/04/24 1524 01/06/24 0500  Weight: 90.7 kg 96.6 kg    Examination:  General exam: Appears calm and comfortable, right frontal hematoma seen Neck: Decreased range of motion with tenderness over posterior neck  Respiratory system: Clear to auscultation.  Respiratory effort normal. Cardiovascular system: S1 & S2 heard, RRR. No JVD, murmurs, rubs, gallops or clicks. No pedal edema. Gastrointestinal system: Abdomen is soft, benign Central nervous system: Alert and oriented. No focal neurological deficits. Extremities: Symmetric 5 x 5 power. Skin: No rashes, lesions or ulcers, right frontal hematoma Psychiatry:  Judgement and insight appear normal. Mood & affect appropriate.     Data Reviewed: I have personally reviewed following labs and imaging studies  CBC: Recent Labs  Lab 01/04/24 1528 01/05/24 0527  WBC 8.0 12.3*  HGB 13.6 12.0  HCT 42.5 40.3  MCV 90.6 96.2  PLT 257 290   Basic Metabolic Panel: Recent Labs  Lab 01/04/24 1528 01/05/24 0527  NA 138 142  K 4.6 4.0  CL 102 106  CO2 25 29  GLUCOSE 275* 140*  BUN 16 14  CREATININE 0.84 0.86  CALCIUM  10.0 9.3  MG 2.0  --    GFR: Estimated Creatinine Clearance: 76.8 mL/min (by C-G formula based on SCr of 0.86 mg/dL). Liver Function Tests: Recent Labs  Lab 01/04/24 1528 01/05/24 0527  AST 20 19  ALT 14 13  ALKPHOS 105 84  BILITOT 0.3 0.3  PROT 7.2 6.4*  ALBUMIN 4.4 4.1   No results for input(s): LIPASE, AMYLASE in the last 168 hours. No results for input(s): AMMONIA in the last 168 hours. Coagulation Profile: No results for input(s): INR, PROTIME in the last 168 hours. Cardiac Enzymes: No results for input(s): CKTOTAL, CKMB, CKMBINDEX, TROPONINI in the last 168 hours. BNP (last 3 results) Recent Labs    01/04/24 1528  PROBNP 633.0*   HbA1C: No results for input(s): HGBA1C in the last 72 hours. CBG: Recent Labs  Lab 01/05/24 2101 01/06/24 0433 01/06/24 0810 01/06/24 1203 01/06/24 1619  GLUCAP 165* 236* 216* 221* 252*   Lipid Profile: No results for input(s): CHOL, HDL, LDLCALC, TRIG, CHOLHDL, LDLDIRECT in the last 72 hours. Thyroid  Function Tests: Recent Labs    01/04/24 1528  TSH 0.936   Radiology Studies: ECHOCARDIOGRAM COMPLETE Result Date: 01/06/2024    ECHOCARDIOGRAM REPORT   Patient Name:   Kirsten [name] Date of Exam: 01/06/2024 Medical Rec #:  [id number]        Height:       67.0 in Accession #:    [id number]       Weight:       213.0 lb Date of Birth:  12/10/1955        BSA:          2.077 m Patient Age:    68 years         BP:           135/38 mmHg Patient  Gender: F                HR:           76 bpm. Exam Location:  [facility] Procedure: 2D Echo, Color Doppler and Cardiac Doppler (Both Spectral and Color            Flow Doppler were utilized during procedure). Indications:     Syncope R55  History:         Patient has prior history of Echocardiogram examinations, most                  recent 09/30/2021. Arrythmias:Atrial Fibrillation; Risk                  Factors:Sleep Apnea and Hypertension.  Sonographer:  Christopher Furnace Referring Phys:  [id number] [facility] LITTIE FUSS Diagnosing Phys: Annalee Custovic  Sonographer Comments: Technically challenging study due to limited acoustic windows and no apical window. Best view is modified subcostal. IMPRESSIONS  1. Left ventricular ejection fraction, by estimation, is 60 to 65%. The left ventricle has normal function. The left ventricle has no regional wall motion abnormalities. Left ventricular diastolic parameters were normal.  2. Right ventricular systolic function is normal. The right ventricular size is normal.  3. The mitral valve is normal in structure. Mild mitral valve regurgitation. No evidence of mitral stenosis.  4. The aortic valve is normal in structure. Aortic valve regurgitation is not visualized. No aortic stenosis is present.  5. The inferior vena cava is normal in size with greater than 50% respiratory variability, suggesting right atrial pressure of 3 mmHg. FINDINGS  Left Ventricle: Left ventricular ejection fraction, by estimation, is 60 to 65%. The left ventricle has normal function. The left ventricle has no regional wall motion abnormalities. The left ventricular internal cavity size was normal in size. There is  no left ventricular hypertrophy. Left ventricular diastolic parameters were normal. Right Ventricle: The right ventricular size is normal. No increase in right ventricular wall thickness. Right ventricular systolic function is normal. Left Atrium: Left atrial size was normal in size. Right Atrium: Right atrial  size was normal in size. Pericardium: There is no evidence of pericardial effusion. Mitral Valve: The mitral valve is normal in structure. Mild mitral valve regurgitation. No evidence of mitral valve stenosis. MV peak gradient, 5.7 mmHg. The mean mitral valve gradient is 2.0 mmHg. Tricuspid Valve: The tricuspid valve is normal in structure. Tricuspid valve regurgitation is mild. Aortic Valve: The aortic valve is normal in structure. Aortic valve regurgitation is not visualized. No aortic stenosis is present. Aortic valve mean gradient measures 2.0 mmHg. Aortic valve peak gradient measures 3.5 mmHg. Aortic valve area, by VTI measures 3.21 cm. Pulmonic Valve: The pulmonic valve was normal in structure. Pulmonic valve regurgitation is not visualized. Aorta: The aortic root is normal in size and structure. Venous: The inferior vena cava is normal in size with greater than 50% respiratory variability, suggesting right atrial pressure of 3 mmHg. IAS/Shunts: No atrial level shunt detected by color flow Doppler.  LEFT VENTRICLE PLAX 2D LVIDd:         4.70 cm   Diastology LVIDs:         3.00 cm   LV e' medial:    7.07 cm/s LV PW:         0.70 cm   LV E/e' medial:  13.0 LV IVS:        0.90 cm   LV e' lateral:   12.40 cm/s LVOT diam:     2.00 cm   LV E/e' lateral: 7.4 LV SV:         61 LV SV Index:   29 LVOT Area:     3.14 cm LV IVRT:       77 msec  RIGHT VENTRICLE RV Basal diam:  3.80 cm RV Mid diam:    3.30 cm LEFT ATRIUM           Index        RIGHT ATRIUM           Index LA diam:      3.20 cm 1.54 cm/m   RA Area:     21.30 cm LA Vol (A4C): 42.2 ml 20.32 ml/m  RA Volume:   71.80 ml  34.57 ml/m  AORTIC VALVE AV Area (Vmax):    3.06 cm AV Area (Vmean):   2.69 cm AV Area (VTI):     3.21 cm AV Vmax:           93.30 cm/s AV Vmean:          73.300 cm/s AV VTI:            0.189 m AV Peak Grad:      3.5 mmHg AV Mean Grad:      2.0 mmHg LVOT Vmax:         91.00 cm/s LVOT Vmean:        62.700 cm/s LVOT VTI:          0.193 m  LVOT/AV VTI ratio: 1.02  AORTA Ao Root diam: 2.20 cm MITRAL VALVE               TRICUSPID VALVE MV Area (PHT): 4.29 cm    TR Peak grad:   49.0 mmHg MV Area VTI:   2.42 cm    TR Vmax:        350.00 cm/s MV Peak grad:  5.7 mmHg MV Mean grad:  2.0 mmHg    SHUNTS MV Vmax:       1.19 m/s    Systemic VTI:  0.19 m MV Vmean:      66.6 cm/s   Systemic Diam: 2.00 cm MV Decel Time: 177 msec MV E velocity: 91.70 cm/s MV A velocity: 78.60 cm/s MV E/A ratio:  1.17 Designer, Multimedia signed by Annalee Casa Signature Date/Time: 01/06/2024/10:12:28 AM    Final    MR BRAIN WO CONTRAST Result Date: 01/04/2024 EXAM: MRI BRAIN WITH CONTRAST MRI CERVICAL SPINE WITHOUT CONTRAST 01/04/2024 10:51:13 PM TECHNIQUE: Multiplanar multisequence MRI of the brain was performed with the administration of 9 mL gadobutrol  (GADAVIST ) 1 MMOL/ML intravenous contrast. Multiplanar multisequence MRI of the cervical spine was performed without the administration of intravenous contrast. COMPARISON: Brain MRI dated 05/26/2021. Cervical spine MRI dated 01/28/2020. CLINICAL HISTORY: Syncope/presyncope, cerebrovascular cause suspected. FINDINGS: MRI BRAIN: BRAIN AND VENTRICLES: Multifocal hyperintense T2-weighted signal within the cerebral white matter, most commonly due to chronic small vessel disease. No acute infarct. No acute intracranial hemorrhage. No mass or abnormal enhancement. No midline shift. No hydrocephalus. The sella is unremarkable. Normal flow voids. ORBITS: No acute abnormality. SINUSES AND MASTOIDS: No acute abnormality. BONES AND SOFT TISSUES: Small right frontal scalp hematoma. Normal bone marrow signal. MRI CERVICAL SPINE: BONES AND ALIGNMENT: Reversal of normal cervical lordosis. Normal vertebral body heights. Marrow signal is unremarkable. No abnormal enhancement. SPINAL CORD: Normal spinal cord size. Normal spinal cord signal. SOFT TISSUES: Unremarkable. C2-C3: Severe left facet hypertrophy with moderate left  foraminal stenosis unchanged. C3-C4: Mild left facet hypertrophy. C4-C5: Mild right uncovertebral spurring. C5-C6: Right greater than left uncovertebral hypertrophy with right foraminal disc protrusion, moderate spinal canal stenosis, and severe right foraminal stenosis. C6-C7: Small disc bulge with uncovertebral hypertrophy and mild spinal canal stenosis. C7-T1: No significant disc herniation. No spinal canal stenosis or neural foraminal narrowing. IMPRESSION: 1. No acute intracranial abnormality. 2. Multifocal hyperintense T2-weighted signal within the cerebral white matter, most commonly due to chronic small vessel disease. 3. Small right frontal scalp hematoma. 4. Severe left facet hypertrophy with moderate left foraminal stenosis at C3-4, unchanged. 5. Right greater than left uncovertebral hypertrophy with right foraminal disc protrusion, moderate spinal canal stenosis, and severe right foraminal stenosis at C5-6, unchanged. Electronically signed by: Franky Stanford MD 01/04/2024 11:07  PM EST RP Workstation: [id number]   MR CERVICAL SPINE W WO CONTRAST Result Date: 01/04/2024 EXAM: MRI BRAIN WITH CONTRAST MRI CERVICAL SPINE WITHOUT CONTRAST 01/04/2024 10:51:13 PM TECHNIQUE: Multiplanar multisequence MRI of the brain was performed with the administration of 9 mL gadobutrol  (GADAVIST ) 1 MMOL/ML intravenous contrast. Multiplanar multisequence MRI of the cervical spine was performed without the administration of intravenous contrast. COMPARISON: Brain MRI dated 05/26/2021. Cervical spine MRI dated 01/28/2020. CLINICAL HISTORY: Syncope/presyncope, cerebrovascular cause suspected. FINDINGS: MRI BRAIN: BRAIN AND VENTRICLES: Multifocal hyperintense T2-weighted signal within the cerebral white matter, most commonly due to chronic small vessel disease. No acute infarct. No acute intracranial hemorrhage. No mass or abnormal enhancement. No midline shift. No hydrocephalus. The sella is unremarkable. Normal flow voids.  ORBITS: No acute abnormality. SINUSES AND MASTOIDS: No acute abnormality. BONES AND SOFT TISSUES: Small right frontal scalp hematoma. Normal bone marrow signal. MRI CERVICAL SPINE: BONES AND ALIGNMENT: Reversal of normal cervical lordosis. Normal vertebral body heights. Marrow signal is unremarkable. No abnormal enhancement. SPINAL CORD: Normal spinal cord size. Normal spinal cord signal. SOFT TISSUES: Unremarkable. C2-C3: Severe left facet hypertrophy with moderate left foraminal stenosis unchanged. C3-C4: Mild left facet hypertrophy. C4-C5: Mild right uncovertebral spurring. C5-C6: Right greater than left uncovertebral hypertrophy with right foraminal disc protrusion, moderate spinal canal stenosis, and severe right foraminal stenosis. C6-C7: Small disc bulge with uncovertebral hypertrophy and mild spinal canal stenosis. C7-T1: No significant disc herniation. No spinal canal stenosis or neural foraminal narrowing. IMPRESSION: 1. No acute intracranial abnormality. 2. Multifocal hyperintense T2-weighted signal within the cerebral white matter, most commonly due to chronic small vessel disease. 3. Small right frontal scalp hematoma. 4. Severe left facet hypertrophy with moderate left foraminal stenosis at C3-4, unchanged. 5. Right greater than left uncovertebral hypertrophy with right foraminal disc protrusion, moderate spinal canal stenosis, and severe right foraminal stenosis at C5-6, unchanged. Electronically signed by: Franky Stanford MD 01/04/2024 11:07 PM EST RP Workstation: [id number]   CT Head Wo Contrast Result Date: 01/04/2024 EXAM: CT HEAD WITHOUT CONTRAST 01/04/2024 04:52:21 PM TECHNIQUE: CT of the head was performed without the administration of intravenous contrast. Automated exposure control, iterative reconstruction, and/or weight based adjustment of the mA/kV was utilized to reduce the radiation dose to as low as reasonably achievable. COMPARISON: Comparison with CT head without contrast 09/16/2021.  CLINICAL HISTORY: Fall, on Eliquis . FINDINGS: BRAIN AND VENTRICLES: No acute hemorrhage. No evidence of acute infarct. No hydrocephalus. No extra-axial collection. No mass effect or midline shift. Mild periventricular chronic small vessel ischemia. ORBITS: Bilateral cataract resection noted. SINUSES: No acute abnormality. SOFT TISSUES AND SKULL: Right frontal scalp hematoma. No underlying fracture or radiopaque foreign body. Atherosclerosis of skullbase vasculature. IMPRESSION: 1. No acute intracranial abnormality. 2. Right frontal scalp hematoma without underlying fracture or radiopaque foreign body. Electronically signed by: Lonni Necessary MD 01/04/2024 05:07 PM EST RP Workstation: [id number]   CT Cervical Spine Wo Contrast Result Date: 01/04/2024 EXAM: CT CERVICAL SPINE WITHOUT CONTRAST 01/04/2024 04:52:21 PM TECHNIQUE: CT of the cervical spine was performed without the administration of intravenous contrast. Multiplanar reformatted images are provided for review. Automated exposure control, iterative reconstruction, and/or weight based adjustment of the mA/kV was utilized to reduce the radiation dose to as low as reasonably achievable. COMPARISON: CT cervical spine without contrast 12/19/2021. CLINICAL HISTORY: Syncopal episode. Trauma to head. FINDINGS: CERVICAL SPINE: BONES AND ALIGNMENT: No acute fracture or traumatic malalignment. DEGENERATIVE CHANGES: Asymmetric left-sided facet hypertrophy is present at C2-C3, C3-C4, and C7-T1. Moderate left foraminal stenosis is  present at C2-C3 and C7-T1. Uncovertebral spurring contributes to moderate foraminal narrowing bilaterally at C6-C7, right greater than left. SOFT TISSUES: No prevertebral soft tissue swelling. Atherosclerotic calcifications are present at the left carotid bifurcation without significant stenosis. IMPRESSION: 1. No acute abnormality of the cervical spine related to the reported fall. 2. Moderate multilevel degenerative changes with  foraminal stenosis as detailed, without severe central canal stenosis. Electronically signed by: Lonni Necessary MD 01/04/2024 05:05 PM EST RP Workstation: [id number]   CT Angio Chest PE W and/or Wo Contrast Result Date: 01/04/2024 CLINICAL DATA:  Chest pain.  Concern for pulmonary embolism. EXAM: CT ANGIOGRAPHY CHEST WITH CONTRAST TECHNIQUE: Multidetector CT imaging of the chest was performed using the standard protocol during bolus administration of intravenous contrast. Multiplanar CT image reconstructions and MIPs were obtained to evaluate the vascular anatomy. RADIATION DOSE REDUCTION: This exam was performed according to the departmental dose-optimization program which includes automated exposure control, adjustment of the mA and/or kV according to patient size and/or use of iterative reconstruction technique. CONTRAST:  75mL OMNIPAQUE  IOHEXOL  350 MG/ML SOLN COMPARISON:  Chest CT dated 02/01/2023. FINDINGS: Cardiovascular: Borderline cardiomegaly. No pericardial effusion. The thoracic aorta is unremarkable. The origins of the great vessels of the aortic arch are patent. Evaluation of the pulmonary arteries is limited due to respiratory motion. No pulmonary artery embolus identified. Mediastinum/Nodes: No hilar or mediastinal adenopathy. The esophagus is grossly unremarkable no mediastinal fluid collection. Lungs/Pleura: Right posterior diaphragmatic defect with herniation of abdominal fat. There are bibasilar atelectasis. No consolidative changes. There is no pleural effusion or pneumothorax. The central airways are patent. Upper Abdomen: No acute abnormality. Musculoskeletal: Osteopenia.  No acute osseous pathology. Review of the MIP images confirms the above findings. IMPRESSION: No acute intrathoracic pathology. No CT evidence of pulmonary artery embolus. Electronically Signed   By: Vanetta Chou M.D.   On: 01/04/2024 17:03        Scheduled Meds:  ALPRAZolam   0.25 mg Oral TID    amiodarone   100 mg Oral Daily   apixaban   5 mg Oral BID   atorvastatin   40 mg Oral Daily   dexamethasone   4 mg Oral Q6H   fluticasone   1 spray Each Nare Daily   fluticasone  furoate-vilanterol  1 puff Inhalation Daily   furosemide   20 mg Oral Daily   insulin  aspart  0-15 Units Subcutaneous TID WC   insulin  aspart  0-5 Units Subcutaneous QHS   ketorolac   15 mg Intravenous Q6H   levothyroxine   150 mcg Oral Q0600   lidocaine   1 patch Transdermal Q24H   losartan   25 mg Oral Daily   Muscle Rub   Topical Daily   PARoxetine   20 mg Oral Daily   sodium chloride  flush  3 mL Intravenous Q12H   Continuous Infusions:  sodium chloride  Stopped (01/06/24 1305)     LOS: 1 day    Time spent: 35 minutes    Yvana Samonte Maree, MD [facility] Pager [phone]  If 7PM-7AM, please contact night-coverage [email or web address]  01/06/2024, 4:23 PM

## 2024-01-06 NOTE — Progress Notes (Signed)
 Mobility Specialist - Progress Note   01/06/24 1435  Mobility  Activity Ambulated with assistance  Level of Assistance Standby assist, set-up cues, supervision of patient - no hands on  Assistive Device None  Distance Ambulated (ft) 24 ft  Activity Response Tolerated well  Mobility visit 1 Mobility  Mobility Specialist Start Time (ACUTE ONLY) 1420  Mobility Specialist Stop Time (ACUTE ONLY) 1427  Mobility Specialist Time Calculation (min) (ACUTE ONLY) 7 min   Pt supine upon entry, utilizing 2L [place]. Pt amb to/from the front door within the room MinG-SBA--- expressed R shoulder/neck stiffness, denies SOB. Pt left supine with alarm set and needs within reach.  America Silvan Mobility Specialist 01/06/24 2:39 PM

## 2024-01-06 NOTE — Progress Notes (Unsigned)
 Referring Physician:  Joshua Debby CROME, MD [address] [place],  [place] [address]  Primary Physician:  Joshua Debby CROME, MD  History of Present Illness: 01/14/2024 Kirsten [name] has a history of HTN, afib, asthma, OSA on CPAP, pulmonary fibrosis, NASH, DM, hypothyroidism, obesity, GAD, hyperlipidemia, depression, iron deficiency anemia.   She was seen by [facility] as hospital consult on 01/06/24. She has cervical spondylosis with facet arthropathy and associated torticollis.   She discussed follow up with IR for cervical facet injections (Dr. Thom Hall). She is being scheduled for right C3-C4 MBB- they are getting clearance to hold her eliquis .   She was discharged on flexeril  5mg , tapering dose of dexamethasone , and percocet 5/325. She is not taking percocet. She finished the steroids. She has taken some flexeril  but it makes her sleepy. She does not like to take medications.   She is here for follow up.   She continues with constant neck pain with radiation to right shoulder. No arm pain. No numbness, tingling, or weakness. No specific aggravating factors.   She is on ELIQUIS .   She has seen [facility] in the past for her SI joints and had injections. Was at visit to see them for her neck when she had syncopal episode and was then admitted.   Tobacco use: Does not smoke.   Bowel/Bladder Dysfunction: urinary urgency x years, no incontinence issues. No bowel issues.   Conservative measures:  Physical therapy: has not participated in for her neck Multimodal medical therapy including regular antiinflammatories:   Injections: no injections for her neck  Past Surgery: no spine surgery  Kirsten [name] has no symptoms of cervical myelopathy.  The symptoms are causing a significant impact on the patient's life.   Review of Systems:  A 10 point review of systems is negative, except for the pertinent positives and negatives detailed in the HPI.  Past Medical  History: Past Medical History:  Diagnosis Date   A-fib (HCC)    Anxiety disorder    Arthritis    Asthma    Gastroschisis    umphalocele, rotated organs abd repair until age 33   GERD (gastroesophageal reflux disease)    HTN (hypertension)    Hyperlipidemia    Hypothyroidism    IBS (irritable bowel syndrome)    Morbid obesity (HCC)    Target wt - 185  for BMI < 30   Obesity    OSA on CPAP    uses with 2L O2   Pneumonia    PONV (postoperative nausea and vomiting)    SBO (small bowel obstruction) (HCC)    Resolved with NG/Bowel rest around 2009   Sleep apnea    Type II or unspecified type diabetes mellitus without mention of complication, not stated as uncontrolled     Past Surgical History: Past Surgical History:  Procedure Laterality Date   BREAST EXCISIONAL BIOPSY Left 03/19/2012   neg   CESAREAN SECTION     CHOLECYSTECTOMY  1992   COLONOSCOPY  2011   2011-normal   ESOPHAGOGASTRODUODENOSCOPY N/A 10/10/2023   Procedure: EGD (ESOPHAGOGASTRODUODENOSCOPY);  Surgeon: Stacia Glendia BRAVO, MD;  Location: [facility] ENDOSCOPY;  Service: Gastroenterology;  Laterality: N/A;   Newborn Surgery - GI - ORGANS OUTSIDE ABDOMEN     RIGHT/LEFT HEART CATH AND CORONARY ANGIOGRAPHY N/A 10/02/2021   Procedure: RIGHT/LEFT HEART CATH AND CORONARY ANGIOGRAPHY;  Surgeon: Ammon Blunt, MD;  Location: [facility] INVASIVE CV LAB;  Service: Cardiovascular;  Laterality: N/A;   SAVORY DILATION N/A  10/10/2023   Procedure: EGD, WITH DILATION USING SAVARY-GILLIARD DILATOR OVER GUIDEWIRE;  Surgeon: Stacia Glendia BRAVO, MD;  Location: [facility] ENDOSCOPY;  Service: Gastroenterology;  Laterality: N/A;   Small Bowel Repair     TUBAL LIGATION  1988    Allergies: Allergies as of 01/14/2024 - Review Complete 01/08/2024  Allergen Reaction Noted   Doxycycline  Rash 12/20/2020   Factive [gemifloxacin mesylate] Rash 06/07/2010   Sulfonamide derivatives Rash    Crestor  [rosuvastatin ]  10/12/2013   Gemifloxacin Rash 10/04/2014    Penicillins Hives and Rash     Medications: Outpatient Encounter Medications as of 01/14/2024  Medication Sig   albuterol  (VENTOLIN  HFA) 108 (90 Base) MCG/ACT inhaler Inhale 2 puffs into the lungs every 6 (six) hours as needed for wheezing or shortness of breath.   ALPRAZolam  (XANAX ) 0.25 MG tablet Take 1 tablet (0.25 mg total) by mouth 3 (three) times daily as needed for anxiety.   amiodarone  (PACERONE ) 200 MG tablet Take 1 tablet (200 mg total) by mouth daily.   atorvastatin  (LIPITOR) 40 MG tablet TAKE ONE TABLET BY MOUTH ONE TIME DAILY   Blood Glucose Monitoring Suppl (ONETOUCH VERIO FLEX SYSTEM) w/Device KIT 1 Act by Does not apply route in the morning, at noon, and at bedtime.   Blood Glucose Monitoring Suppl DEVI 1 each by Does not apply route as directed. Dispense based on patient and insurance preference. Use up to four times daily as directed. (FOR ICD-10 E10.9, E11.9).   Continuous Glucose Sensor (FREESTYLE LIBRE 3 PLUS SENSOR) MISC Apply 1 Act topically every 14 (fourteen) days. Change sensor every 15 days.   cyanocobalamin  (VITAMIN B12) 1000 MCG/ML injection INJECT 1ML INTO THE MUSCLE ONCE A MONTH   ELIQUIS  5 MG TABS tablet TAKE ONE TABLET BY MOUTH TWICE A DAY   fluticasone  (FLONASE ) 50 MCG/ACT nasal spray Place 1 spray into both nostrils daily.   furosemide  (LASIX ) 20 MG tablet Take 20 mg by mouth daily.   Glucose Blood (BLOOD GLUCOSE TEST STRIPS) STRP 1 each by Does not apply route as directed. Dispense based on patient and insurance preference. Use up to four times daily as directed. (FOR ICD-10 E10.9, E11.9).   insulin  degludec (TRESIBA  FLEXTOUCH) 200 UNIT/ML FlexTouch Pen Inject 20 Units into the skin daily.   Insulin  Pen Needle 32G X 6 MM MISC 1 Act by Does not apply route once a week.   Lancet Device MISC 1 each by Does not apply route as directed. Dispense based on patient and insurance preference. Use up to four times daily as directed. (FOR ICD-10 E10.9, E11.9).   Lancets  MISC 1 each by Does not apply route as directed. Dispense based on patient and insurance preference. Use up to four times daily as directed. (FOR ICD-10 E10.9, E11.9).   levothyroxine  (SYNTHROID ) 150 MCG tablet TAKE ONE TABLET BY MOUTH ONCE DAILY   losartan  (COZAAR ) 25 MG tablet Take 25 mg by mouth daily.   Menthol -Methyl Salicylate  (MUSCLE RUB) 10-15 % CREA Apply 1 Application topically daily.   metFORMIN  (GLUCOPHAGE -XR) 750 MG 24 hr tablet TAKE ONE TABLET BY MOUTH ONE TIME DAILY WITH BREAKFAST   metoprolol  succinate (TOPROL -XL) 25 MG 24 hr tablet Take 25 mg by mouth daily.   OXYGEN  Inhale 2 L into the lungs at bedtime. Uses with CPAP   PARoxetine  (PAXIL ) 20 MG tablet Take 1 tablet (20 mg total) by mouth daily.   Semaglutide ,0.25 or 0.5MG /DOS, (OZEMPIC , 0.25 OR 0.5 MG/DOSE,) 2 MG/3ML SOPN Inject 0.25 mg into the skin once  a week.   torsemide  (DEMADEX ) 20 MG tablet Take 20 mg by mouth as needed (Edema in legs).   [DISCONTINUED] fluticasone  furoate-vilanterol (BREO ELLIPTA ) 100-25 MCG/ACT AEPB Inhale 1 puff into the lungs daily.   [DISCONTINUED] acetaminophen  (TYLENOL ) 325 MG tablet Take 650 mg by mouth every 4 (four) hours as needed.   [DISCONTINUED] albuterol  (PROVENTIL ) (2.5 MG/3ML) 0.083% nebulizer solution 2.5 mg    [DISCONTINUED] ALPRAZolam  (XANAX ) tablet 0.25 mg    [DISCONTINUED] amiodarone  (PACERONE ) tablet 100 mg    [DISCONTINUED] apixaban  (ELIQUIS ) tablet 5 mg    [DISCONTINUED] atorvastatin  (LIPITOR) tablet 40 mg    [DISCONTINUED] cyclobenzaprine  (FLEXERIL ) tablet 5 mg    [DISCONTINUED] dexamethasone  (DECADRON ) tablet 4 mg    [DISCONTINUED] fluticasone  (FLONASE ) 50 MCG/ACT nasal spray 1 spray    [DISCONTINUED] fluticasone  furoate-vilanterol (BREO ELLIPTA ) 100-25 MCG/ACT 1 puff    [DISCONTINUED] furosemide  (LASIX ) tablet 20 mg    [DISCONTINUED] insulin  aspart (novoLOG ) injection 0-15 Units    [DISCONTINUED] insulin  aspart (novoLOG ) injection 0-5 Units    [DISCONTINUED] ketorolac   (TORADOL ) 15 MG/ML injection 15 mg    [DISCONTINUED] levothyroxine  (SYNTHROID ) tablet 150 mcg    [DISCONTINUED] lidocaine  (LIDODERM ) 5 % 1 patch    [DISCONTINUED] losartan  (COZAAR ) tablet 25 mg    [DISCONTINUED] Muscle Rub CREA    [DISCONTINUED] oxyCODONE -acetaminophen  (PERCOCET/ROXICET) 5-325 MG per tablet 1 tablet    [DISCONTINUED] PARoxetine  (PAXIL ) tablet 20 mg    [DISCONTINUED] sodium chloride  flush (NS) 0.9 % injection 3 mL    No facility-administered encounter medications on file as of 01/14/2024.    Social History: Social History   Tobacco Use   Smoking status: Never   Smokeless tobacco: Never   Tobacco comments:    tried as a teen  [employer]   Vaping status: Never Used  Substance Use Topics   Alcohol use: Never    Alcohol/week: 2.0 standard drinks of alcohol    Types: 2 Glasses of wine per week   Drug use: No    Family Medical History: Family History  Problem Relation Age of Onset   Lymphoma Mother    Diabetes type II Father    Colon cancer Father    Stomach cancer Father    Diabetes Maternal Grandmother    Diabetes type I Son    Goiter Neg Hx    Breast cancer Neg Hx    Ovarian cancer Neg Hx    Esophageal cancer Neg Hx    Pancreatic cancer Neg Hx     Physical Examination: Vitals:   01/14/24 1513  BP: 120/78    General: Patient is well developed, well nourished, calm, collected, and in no apparent distress. Attention to examination is appropriate.  Respiratory: Patient is breathing without any difficulty.   NEUROLOGICAL:     Awake, alert, oriented to person, place, and time.  Speech is clear and fluent. Fund of knowledge is appropriate.   Cranial Nerves: Pupils equal round and reactive to light.  Facial tone is symmetric.    No posterior cervical tenderness. Mild right sided cervical tenderness into trapezial region.   Good ROM of both shoulders with no pain.   No abnormal lesions on exposed skin.   Strength: Side Biceps Triceps Deltoid  Interossei Grip Wrist Ext. Wrist Flex.  R 5 5 5 5 5 5 5   L 5 5 5 5 5 5 5    Side Iliopsoas Quads Hamstring PF DF EHL  R 5 5 5 5 5 5   L 5 5 5 5  5  5   Reflexes are 2+ and symmetric at the biceps, brachioradialis, patella and achilles.   Hoffman's is absent.  Clonus is not present.   Bilateral upper and lower extremity sensation is intact to light touch.     Gait is normal.  Medical Decision Making  Imaging: Nothing new.   Assessment and Plan: Ms. Newton is a pleasant 68 y.o. female has ***  Treatment options discussed with patient and following plan made:    - Discussed PT for cervical spine and she declines for now. May revisit after her injection.  - Cervical facet injections with IR ordered- they should be calling her.  - Will stop flexeril  and new prescription for robaxin to take prn muscle spasms. Reviewed dosing and side effects. Discussed this can cause drowsiness.  - Will message her after the holidays to check on her progress.   I spent a total of 30 minutes in face-to-face and non-face-to-face activities related to this patient's care today including review of outside records, review of imaging, review of symptoms, physical exam, discussion of differential diagnosis, discussion of treatment options, and documentation.   Thank you for involving me in the care of this patient.   Glade Boys PA-C Dept. of Neurosurgery

## 2024-01-06 NOTE — Progress Notes (Signed)
 1      PROGRESS NOTE    HOORIA GASPARINI  FMW:[id number] DOB: Oct 20, 1955 DOA: 01/04/2024 PCP: Joshua Debby CROME, MD   Brief Narrative:   68 y.o. female with medical history significant of atrial fibrillation, anxiety disorder, essential hypertension, GERD, hyperlipidemia, hypothyroidism, NASH cirrhosis morbid obesity, obstructive sleep apnea, type 2 diabetes, who started having significant neck pain and radiculopathy in the last few days   11/23: Neurosurgery and cardiology consult   Assessment & Plan:   Principal Problem:   Syncope and collapse Active Problems:   OSA on CPAP   Hypothyroidism   Essential hypertension, benign   Paroxysmal atrial fibrillation (HCC)   Type II diabetes mellitus with manifestations (HCC)   Hyperlipidemia with target LDL less than 100   Neck pain   Insomnia w/ sleep apnea   NASH (nonalcoholic steatohepatitis)   Facet arthropathy, cervical   #1 syncope: Could be due to hypoxia.  She was hypoxic on arrival and also on recheck today.  Her oxygen  saturation dropped to mid 70s on minimal ambulation.  Patient is having significant cervical radiculopathy and could have had vasovagal syncope due to some neurologic release.    PT and OT consultation. Echocardiogram, cardiology consultation -She remains in sinus rhythm with baseline left bundle branch block unchanged from prior EKGs. Initial troponins are unremarkable along with the EKG.  - Check orthostatic vitals   #2 cervical radiculopathy, spondylosis with facet arthropathy and associated torticollis Will start IV Toradol  and pain medicine as needed.  Also starting Decadron  and will obtain neurosurgery consult   #3 type 2 diabetes: Initiate sliding scale insulin .  Continue to monitor   #4 NASH cirrhosis: Appears to be at baseline.  Continue to monitor   #5 hyperlipidemia: Continue with statin   #6 paroxysmal atrial fibrillation: In sinus rhythm now.  Continue amiodarone  for rate control, Eliquis  for  anticoagulation   #7 essential hypertension: Continue Lasix , losartan  and amiodarone    #8 obstructive sleep apnea: Continue with CPAP   #9 hypothyroidism: Continue levothyroxine    #10 generalized anxiety disorder: Continue Xanax  and Paxil    DVT prophylaxis: Eliquis   apixaban  (ELIQUIS ) tablet 5 mg     Code Status: (Full code Family Communication: Husband updated at bedside Disposition Plan: Possible discharge in next 1 to 2 days depending on clinical condition, neurosurgery evaluation   Consultants:  Cardiology Neurosurgery   Subjective:  She is having significant neck pain and unable to be comfortable with any movement, has not been able to sleep for the last 3 days  Objective: Vitals:   01/06/24 0431 01/06/24 0500 01/06/24 0810 01/06/24 1222  BP: (!) 135/38  119/72 (!) 147/63  Pulse: 76  70 78  Resp: 18  16 16   Temp: 98.4 F (36.9 C)  98.9 F (37.2 C)   TempSrc:      SpO2: 90%  94% 96%  Weight:  96.6 kg    Height:        Intake/Output Summary (Last 24 hours) at 01/06/2024 1614 Last data filed at 01/06/2024 1300 Gross per 24 hour  Intake 1345.48 ml  Output --  Net 1345.48 ml   Filed Weights   01/04/24 1524 01/06/24 0500  Weight: 90.7 kg 96.6 kg    Examination:  General exam: Appears calm and comfortable, right frontal hematoma seen Neck: Decreased range of motion with tenderness over posterior neck  Respiratory system: Clear to auscultation. Respiratory effort normal. Cardiovascular system: S1 & S2 heard, RRR. No JVD, murmurs, rubs, gallops or clicks.  No pedal edema. Gastrointestinal system: Abdomen is soft, benign Central nervous system: Alert and oriented. No focal neurological deficits. Extremities: Symmetric 5 x 5 power. Skin: No rashes, lesions or ulcers, right frontal hematoma Psychiatry: Judgement and insight appear normal. Mood & affect appropriate.     Data Reviewed: I have personally reviewed following labs and imaging  studies  CBC: Recent Labs  Lab 01/04/24 1528 01/05/24 0527  WBC 8.0 12.3*  HGB 13.6 12.0  HCT 42.5 40.3  MCV 90.6 96.2  PLT 257 290   Basic Metabolic Panel: Recent Labs  Lab 01/04/24 1528 01/05/24 0527  NA 138 142  K 4.6 4.0  CL 102 106  CO2 25 29  GLUCOSE 275* 140*  BUN 16 14  CREATININE 0.84 0.86  CALCIUM  10.0 9.3  MG 2.0  --    GFR: Estimated Creatinine Clearance: 76.8 mL/min (by C-G formula based on SCr of 0.86 mg/dL). Liver Function Tests: Recent Labs  Lab 01/04/24 1528 01/05/24 0527  AST 20 19  ALT 14 13  ALKPHOS 105 84  BILITOT 0.3 0.3  PROT 7.2 6.4*  ALBUMIN 4.4 4.1   No results for input(s): LIPASE, AMYLASE in the last 168 hours. No results for input(s): AMMONIA in the last 168 hours. Coagulation Profile: No results for input(s): INR, PROTIME in the last 168 hours. Cardiac Enzymes: No results for input(s): CKTOTAL, CKMB, CKMBINDEX, TROPONINI in the last 168 hours. BNP (last 3 results) Recent Labs    01/04/24 1528  PROBNP 633.0*   HbA1C: No results for input(s): HGBA1C in the last 72 hours. CBG: Recent Labs  Lab 01/05/24 1649 01/05/24 2101 01/06/24 0433 01/06/24 0810 01/06/24 1203  GLUCAP 224* 165* 236* 216* 221*   Lipid Profile: No results for input(s): CHOL, HDL, LDLCALC, TRIG, CHOLHDL, LDLDIRECT in the last 72 hours. Thyroid  Function Tests: Recent Labs    01/04/24 1528  TSH 0.936   Radiology Studies: ECHOCARDIOGRAM COMPLETE Result Date: 01/06/2024    ECHOCARDIOGRAM REPORT   Patient Name:   AISIA CORREIRA Date of Exam: 01/06/2024 Medical Rec #:  [id number]        Height:       67.0 in Accession #:    [id number]       Weight:       213.0 lb Date of Birth:  27-Jan-1956        BSA:          2.077 m Patient Age:    68 years         BP:           135/38 mmHg Patient Gender: F                HR:           76 bpm. Exam Location:  [facility] Procedure: 2D Echo, Color Doppler and Cardiac Doppler (Both Spectral and  Color            Flow Doppler were utilized during procedure). Indications:     Syncope R55  History:         Patient has prior history of Echocardiogram examinations, most                  recent 09/30/2021. Arrythmias:Atrial Fibrillation; Risk                  Factors:Sleep Apnea and Hypertension.  Sonographer:     Christopher Furnace Referring Phys:  [id number] EMERY LITTIE FUSS Diagnosing Phys: Sabina Custovic  Sonographer  Comments: Technically challenging study due to limited acoustic windows and no apical window. Best view is modified subcostal. IMPRESSIONS  1. Left ventricular ejection fraction, by estimation, is 60 to 65%. The left ventricle has normal function. The left ventricle has no regional wall motion abnormalities. Left ventricular diastolic parameters were normal.  2. Right ventricular systolic function is normal. The right ventricular size is normal.  3. The mitral valve is normal in structure. Mild mitral valve regurgitation. No evidence of mitral stenosis.  4. The aortic valve is normal in structure. Aortic valve regurgitation is not visualized. No aortic stenosis is present.  5. The inferior vena cava is normal in size with greater than 50% respiratory variability, suggesting right atrial pressure of 3 mmHg. FINDINGS  Left Ventricle: Left ventricular ejection fraction, by estimation, is 60 to 65%. The left ventricle has normal function. The left ventricle has no regional wall motion abnormalities. The left ventricular internal cavity size was normal in size. There is  no left ventricular hypertrophy. Left ventricular diastolic parameters were normal. Right Ventricle: The right ventricular size is normal. No increase in right ventricular wall thickness. Right ventricular systolic function is normal. Left Atrium: Left atrial size was normal in size. Right Atrium: Right atrial size was normal in size. Pericardium: There is no evidence of pericardial effusion. Mitral Valve: The mitral valve is normal in structure.  Mild mitral valve regurgitation. No evidence of mitral valve stenosis. MV peak gradient, 5.7 mmHg. The mean mitral valve gradient is 2.0 mmHg. Tricuspid Valve: The tricuspid valve is normal in structure. Tricuspid valve regurgitation is mild. Aortic Valve: The aortic valve is normal in structure. Aortic valve regurgitation is not visualized. No aortic stenosis is present. Aortic valve mean gradient measures 2.0 mmHg. Aortic valve peak gradient measures 3.5 mmHg. Aortic valve area, by VTI measures 3.21 cm. Pulmonic Valve: The pulmonic valve was normal in structure. Pulmonic valve regurgitation is not visualized. Aorta: The aortic root is normal in size and structure. Venous: The inferior vena cava is normal in size with greater than 50% respiratory variability, suggesting right atrial pressure of 3 mmHg. IAS/Shunts: No atrial level shunt detected by color flow Doppler.  LEFT VENTRICLE PLAX 2D LVIDd:         4.70 cm   Diastology LVIDs:         3.00 cm   LV e' medial:    7.07 cm/s LV PW:         0.70 cm   LV E/e' medial:  13.0 LV IVS:        0.90 cm   LV e' lateral:   12.40 cm/s LVOT diam:     2.00 cm   LV E/e' lateral: 7.4 LV SV:         61 LV SV Index:   29 LVOT Area:     3.14 cm LV IVRT:       77 msec  RIGHT VENTRICLE RV Basal diam:  3.80 cm RV Mid diam:    3.30 cm LEFT ATRIUM           Index        RIGHT ATRIUM           Index LA diam:      3.20 cm 1.54 cm/m   RA Area:     21.30 cm LA Vol (A4C): 42.2 ml 20.32 ml/m  RA Volume:   71.80 ml  34.57 ml/m  AORTIC VALVE AV Area (Vmax):    3.06 cm AV  Area (Vmean):   2.69 cm AV Area (VTI):     3.21 cm AV Vmax:           93.30 cm/s AV Vmean:          73.300 cm/s AV VTI:            0.189 m AV Peak Grad:      3.5 mmHg AV Mean Grad:      2.0 mmHg LVOT Vmax:         91.00 cm/s LVOT Vmean:        62.700 cm/s LVOT VTI:          0.193 m LVOT/AV VTI ratio: 1.02  AORTA Ao Root diam: 2.20 cm MITRAL VALVE               TRICUSPID VALVE MV Area (PHT): 4.29 cm    TR Peak grad:    49.0 mmHg MV Area VTI:   2.42 cm    TR Vmax:        350.00 cm/s MV Peak grad:  5.7 mmHg MV Mean grad:  2.0 mmHg    SHUNTS MV Vmax:       1.19 m/s    Systemic VTI:  0.19 m MV Vmean:      66.6 cm/s   Systemic Diam: 2.00 cm MV Decel Time: 177 msec MV E velocity: 91.70 cm/s MV A velocity: 78.60 cm/s MV E/A ratio:  1.17 Designer, Multimedia signed by Annalee Casa Signature Date/Time: 01/06/2024/10:12:28 AM    Final    MR BRAIN WO CONTRAST Result Date: 01/04/2024 EXAM: MRI BRAIN WITH CONTRAST MRI CERVICAL SPINE WITHOUT CONTRAST 01/04/2024 10:51:13 PM TECHNIQUE: Multiplanar multisequence MRI of the brain was performed with the administration of 9 mL gadobutrol  (GADAVIST ) 1 MMOL/ML intravenous contrast. Multiplanar multisequence MRI of the cervical spine was performed without the administration of intravenous contrast. COMPARISON: Brain MRI dated 05/26/2021. Cervical spine MRI dated 01/28/2020. CLINICAL HISTORY: Syncope/presyncope, cerebrovascular cause suspected. FINDINGS: MRI BRAIN: BRAIN AND VENTRICLES: Multifocal hyperintense T2-weighted signal within the cerebral white matter, most commonly due to chronic small vessel disease. No acute infarct. No acute intracranial hemorrhage. No mass or abnormal enhancement. No midline shift. No hydrocephalus. The sella is unremarkable. Normal flow voids. ORBITS: No acute abnormality. SINUSES AND MASTOIDS: No acute abnormality. BONES AND SOFT TISSUES: Small right frontal scalp hematoma. Normal bone marrow signal. MRI CERVICAL SPINE: BONES AND ALIGNMENT: Reversal of normal cervical lordosis. Normal vertebral body heights. Marrow signal is unremarkable. No abnormal enhancement. SPINAL CORD: Normal spinal cord size. Normal spinal cord signal. SOFT TISSUES: Unremarkable. C2-C3: Severe left facet hypertrophy with moderate left foraminal stenosis unchanged. C3-C4: Mild left facet hypertrophy. C4-C5: Mild right uncovertebral spurring. C5-C6: Right greater than left  uncovertebral hypertrophy with right foraminal disc protrusion, moderate spinal canal stenosis, and severe right foraminal stenosis. C6-C7: Small disc bulge with uncovertebral hypertrophy and mild spinal canal stenosis. C7-T1: No significant disc herniation. No spinal canal stenosis or neural foraminal narrowing. IMPRESSION: 1. No acute intracranial abnormality. 2. Multifocal hyperintense T2-weighted signal within the cerebral white matter, most commonly due to chronic small vessel disease. 3. Small right frontal scalp hematoma. 4. Severe left facet hypertrophy with moderate left foraminal stenosis at C3-4, unchanged. 5. Right greater than left uncovertebral hypertrophy with right foraminal disc protrusion, moderate spinal canal stenosis, and severe right foraminal stenosis at C5-6, unchanged. Electronically signed by: Franky Stanford MD 01/04/2024 11:07 PM EST RP Workstation: [id number]   MR CERVICAL SPINE W WO CONTRAST Result  Date: 01/04/2024 EXAM: MRI BRAIN WITH CONTRAST MRI CERVICAL SPINE WITHOUT CONTRAST 01/04/2024 10:51:13 PM TECHNIQUE: Multiplanar multisequence MRI of the brain was performed with the administration of 9 mL gadobutrol  (GADAVIST ) 1 MMOL/ML intravenous contrast. Multiplanar multisequence MRI of the cervical spine was performed without the administration of intravenous contrast. COMPARISON: Brain MRI dated 05/26/2021. Cervical spine MRI dated 01/28/2020. CLINICAL HISTORY: Syncope/presyncope, cerebrovascular cause suspected. FINDINGS: MRI BRAIN: BRAIN AND VENTRICLES: Multifocal hyperintense T2-weighted signal within the cerebral white matter, most commonly due to chronic small vessel disease. No acute infarct. No acute intracranial hemorrhage. No mass or abnormal enhancement. No midline shift. No hydrocephalus. The sella is unremarkable. Normal flow voids. ORBITS: No acute abnormality. SINUSES AND MASTOIDS: No acute abnormality. BONES AND SOFT TISSUES: Small right frontal scalp hematoma. Normal  bone marrow signal. MRI CERVICAL SPINE: BONES AND ALIGNMENT: Reversal of normal cervical lordosis. Normal vertebral body heights. Marrow signal is unremarkable. No abnormal enhancement. SPINAL CORD: Normal spinal cord size. Normal spinal cord signal. SOFT TISSUES: Unremarkable. C2-C3: Severe left facet hypertrophy with moderate left foraminal stenosis unchanged. C3-C4: Mild left facet hypertrophy. C4-C5: Mild right uncovertebral spurring. C5-C6: Right greater than left uncovertebral hypertrophy with right foraminal disc protrusion, moderate spinal canal stenosis, and severe right foraminal stenosis. C6-C7: Small disc bulge with uncovertebral hypertrophy and mild spinal canal stenosis. C7-T1: No significant disc herniation. No spinal canal stenosis or neural foraminal narrowing. IMPRESSION: 1. No acute intracranial abnormality. 2. Multifocal hyperintense T2-weighted signal within the cerebral white matter, most commonly due to chronic small vessel disease. 3. Small right frontal scalp hematoma. 4. Severe left facet hypertrophy with moderate left foraminal stenosis at C3-4, unchanged. 5. Right greater than left uncovertebral hypertrophy with right foraminal disc protrusion, moderate spinal canal stenosis, and severe right foraminal stenosis at C5-6, unchanged. Electronically signed by: Franky Stanford MD 01/04/2024 11:07 PM EST RP Workstation: [id number]   CT Head Wo Contrast Result Date: 01/04/2024 EXAM: CT HEAD WITHOUT CONTRAST 01/04/2024 04:52:21 PM TECHNIQUE: CT of the head was performed without the administration of intravenous contrast. Automated exposure control, iterative reconstruction, and/or weight based adjustment of the mA/kV was utilized to reduce the radiation dose to as low as reasonably achievable. COMPARISON: Comparison with CT head without contrast 09/16/2021. CLINICAL HISTORY: Fall, on Eliquis . FINDINGS: BRAIN AND VENTRICLES: No acute hemorrhage. No evidence of acute infarct. No hydrocephalus. No  extra-axial collection. No mass effect or midline shift. Mild periventricular chronic small vessel ischemia. ORBITS: Bilateral cataract resection noted. SINUSES: No acute abnormality. SOFT TISSUES AND SKULL: Right frontal scalp hematoma. No underlying fracture or radiopaque foreign body. Atherosclerosis of skullbase vasculature. IMPRESSION: 1. No acute intracranial abnormality. 2. Right frontal scalp hematoma without underlying fracture or radiopaque foreign body. Electronically signed by: Lonni Necessary MD 01/04/2024 05:07 PM EST RP Workstation: [id number]   CT Cervical Spine Wo Contrast Result Date: 01/04/2024 EXAM: CT CERVICAL SPINE WITHOUT CONTRAST 01/04/2024 04:52:21 PM TECHNIQUE: CT of the cervical spine was performed without the administration of intravenous contrast. Multiplanar reformatted images are provided for review. Automated exposure control, iterative reconstruction, and/or weight based adjustment of the mA/kV was utilized to reduce the radiation dose to as low as reasonably achievable. COMPARISON: CT cervical spine without contrast 12/19/2021. CLINICAL HISTORY: Syncopal episode. Trauma to head. FINDINGS: CERVICAL SPINE: BONES AND ALIGNMENT: No acute fracture or traumatic malalignment. DEGENERATIVE CHANGES: Asymmetric left-sided facet hypertrophy is present at C2-C3, C3-C4, and C7-T1. Moderate left foraminal stenosis is present at C2-C3 and C7-T1. Uncovertebral spurring contributes to moderate foraminal narrowing bilaterally at  C6-C7, right greater than left. SOFT TISSUES: No prevertebral soft tissue swelling. Atherosclerotic calcifications are present at the left carotid bifurcation without significant stenosis. IMPRESSION: 1. No acute abnormality of the cervical spine related to the reported fall. 2. Moderate multilevel degenerative changes with foraminal stenosis as detailed, without severe central canal stenosis. Electronically signed by: Lonni Necessary MD 01/04/2024 05:05 PM EST RP  Workstation: [id number]   CT Angio Chest PE W and/or Wo Contrast Result Date: 01/04/2024 CLINICAL DATA:  Chest pain.  Concern for pulmonary embolism. EXAM: CT ANGIOGRAPHY CHEST WITH CONTRAST TECHNIQUE: Multidetector CT imaging of the chest was performed using the standard protocol during bolus administration of intravenous contrast. Multiplanar CT image reconstructions and MIPs were obtained to evaluate the vascular anatomy. RADIATION DOSE REDUCTION: This exam was performed according to the departmental dose-optimization program which includes automated exposure control, adjustment of the mA and/or kV according to patient size and/or use of iterative reconstruction technique. CONTRAST:  75mL OMNIPAQUE  IOHEXOL  350 MG/ML SOLN COMPARISON:  Chest CT dated 02/01/2023. FINDINGS: Cardiovascular: Borderline cardiomegaly. No pericardial effusion. The thoracic aorta is unremarkable. The origins of the great vessels of the aortic arch are patent. Evaluation of the pulmonary arteries is limited due to respiratory motion. No pulmonary artery embolus identified. Mediastinum/Nodes: No hilar or mediastinal adenopathy. The esophagus is grossly unremarkable no mediastinal fluid collection. Lungs/Pleura: Right posterior diaphragmatic defect with herniation of abdominal fat. There are bibasilar atelectasis. No consolidative changes. There is no pleural effusion or pneumothorax. The central airways are patent. Upper Abdomen: No acute abnormality. Musculoskeletal: Osteopenia.  No acute osseous pathology. Review of the MIP images confirms the above findings. IMPRESSION: No acute intrathoracic pathology. No CT evidence of pulmonary artery embolus. Electronically Signed   By: Vanetta Chou M.D.   On: 01/04/2024 17:03        Scheduled Meds:  ALPRAZolam   0.25 mg Oral TID   amiodarone   100 mg Oral Daily   apixaban   5 mg Oral BID   atorvastatin   40 mg Oral Daily   dexamethasone   4 mg Oral Q6H   fluticasone   1 spray Each Nare  Daily   fluticasone  furoate-vilanterol  1 puff Inhalation Daily   furosemide   20 mg Oral Daily   insulin  aspart  0-15 Units Subcutaneous TID WC   insulin  aspart  0-5 Units Subcutaneous QHS   ketorolac   15 mg Intravenous Q6H   levothyroxine   150 mcg Oral Q0600   lidocaine   1 patch Transdermal Q24H   losartan   25 mg Oral Daily   Muscle Rub   Topical Daily   PARoxetine   20 mg Oral Daily   sodium chloride  flush  3 mL Intravenous Q12H   Continuous Infusions:  sodium chloride  Stopped (01/06/24 1305)     LOS: 1 day    Time spent: 35 minutes    Melisha Eggleton Maree, MD [facility] Pager [phone]  If 7PM-7AM, please contact night-coverage [email or web address]  01/06/2024, 4:14 PM

## 2024-01-06 NOTE — Progress Notes (Signed)
*  PRELIMINARY RESULTS* Echocardiogram 2D Echocardiogram has been performed.  Floydene Harder 01/06/2024, 9:05 AM

## 2024-01-06 NOTE — Telephone Encounter (Signed)
 Copied from CRM [id number]. Topic: General - Other >> Jan 06, 2024  2:56 PM Franky GRADE wrote: Reason for CRM: Holli from [facility] is calling to inform Dr.Jones they will be following up with patient once she is discharged from the hospital.

## 2024-01-07 ENCOUNTER — Other Ambulatory Visit: Payer: Self-pay

## 2024-01-07 DIAGNOSIS — M542 Cervicalgia: Secondary | ICD-10-CM | POA: Diagnosis not present

## 2024-01-07 DIAGNOSIS — R0902 Hypoxemia: Principal | ICD-10-CM

## 2024-01-07 DIAGNOSIS — R55 Syncope and collapse: Secondary | ICD-10-CM | POA: Diagnosis not present

## 2024-01-07 LAB — BASIC METABOLIC PANEL WITH GFR
Anion gap: 7 (ref 5–15)
BUN: 27 mg/dL — ABNORMAL HIGH (ref 8–23)
CO2: 28 mmol/L (ref 22–32)
Calcium: 9.5 mg/dL (ref 8.9–10.3)
Chloride: 104 mmol/L (ref 98–111)
Creatinine, Ser: 0.83 mg/dL (ref 0.44–1.00)
GFR, Estimated: >60 mL/min (ref >60)
Glucose, Bld: 246 mg/dL — ABNORMAL HIGH (ref 70–99)
Potassium: 4.7 mmol/L (ref 3.5–5.1)
Sodium: 139 mmol/L (ref 135–145)

## 2024-01-07 LAB — CBC
HCT: 37 % (ref 36.0–46.0)
Hemoglobin: 11.5 g/dL — ABNORMAL LOW (ref 12.0–15.0)
MCH: 29.1 pg (ref 26.0–34.0)
MCHC: 31.1 g/dL (ref 30.0–36.0)
MCV: 93.7 fL (ref 80.0–100.0)
Platelets: 219 K/uL (ref 150–400)
RBC: 3.95 MIL/uL (ref 3.87–5.11)
RDW: 13.1 % (ref 11.5–15.5)
WBC: 10.1 K/uL (ref 4.0–10.5)
nRBC: 0 % (ref 0.0–0.2)

## 2024-01-07 LAB — GLUCOSE, CAPILLARY
Glucose-Capillary: 296 mg/dL — ABNORMAL HIGH (ref 70–99)
Glucose-Capillary: 338 mg/dL — ABNORMAL HIGH (ref 70–99)
Glucose-Capillary: 339 mg/dL — ABNORMAL HIGH (ref 70–99)

## 2024-01-07 MED ORDER — CYCLOBENZAPRINE HCL 5 MG PO TABS
5.0000 mg | ORAL_TABLET | Freq: Three times a day (TID) | ORAL | 0 refills | Status: AC | PRN
  Filled 2024-01-07: qty 15, 5d supply, fill #0

## 2024-01-07 MED ORDER — MUSCLE RUB 10-15 % EX CREA
1.0000 | TOPICAL_CREAM | Freq: Every day | CUTANEOUS | 0 refills | Status: AC
  Filled 2024-01-07: qty 85, 85d supply, fill #0

## 2024-01-07 MED ORDER — FUROSEMIDE 10 MG/ML IJ SOLN
40.0000 mg | INTRAMUSCULAR | Status: AC
  Administered 2024-01-07: 40 mg via INTRAVENOUS
  Filled 2024-01-07: qty 4

## 2024-01-07 MED ORDER — DEXAMETHASONE 4 MG PO TABS
4.0000 mg | ORAL_TABLET | Freq: Two times a day (BID) | ORAL | 0 refills | Status: AC
  Filled 2024-01-07: qty 6, 3d supply, fill #0

## 2024-01-07 MED ORDER — OXYCODONE-ACETAMINOPHEN 5-325 MG PO TABS
1.0000 | ORAL_TABLET | Freq: Three times a day (TID) | ORAL | 0 refills | Status: AC | PRN
  Filled 2024-01-07: qty 15, 5d supply, fill #0

## 2024-01-07 NOTE — Plan of Care (Signed)
  Problem: Education: Goal: Ability to describe self-care measures that may prevent or decrease complications (Diabetes Survival Skills Education) will improve Outcome: Progressing   Problem: Coping: Goal: Ability to adjust to condition or change in health will improve Outcome: Progressing

## 2024-01-07 NOTE — Progress Notes (Signed)
 Went over discharge paperwork with the patient and husband at the bedside.  Patient is requiring oxygen  use at the present time. Nurse offered to reach out to [facility] team to get her oxygen  for the drive home as she has tank at home. Patient refused and said she lived 3 minutes away and does not want to wait here any longer than she has to for oxygen  needs.

## 2024-01-07 NOTE — Inpatient Diabetes Management (Signed)
 Inpatient Diabetes Program Recommendations  AACE/ADA: New Consensus Statement on Inpatient Glycemic Control (2015)  Target Ranges:  Prepandial:   less than 140 mg/dL      Peak postprandial:   less than 180 mg/dL (1-2 hours)      Critically ill patients:  140 - 180 mg/dL   Lab Results  Component Value Date   GLUCAP 338 (H) 01/07/2024   HGBA1C 7.9 (H) 12/17/2023    Review of Glycemic Control  Latest Reference Range & Units 01/06/24 08:10 01/06/24 12:03 01/06/24 16:19 01/06/24 21:38 01/07/24 05:57 01/07/24 07:57  Glucose-Capillary 70 - 99 mg/dL 783 (H) 778 (H) 747 (H) 266 (H) 296 (H) 338 (H)   Diabetes history: DM 2 Outpatient Diabetes medications: Tresiba  20 units Daily, metformin  750 mg Daily, Ozempic  0.25 mg weekly Current orders for Inpatient glycemic control:  Novolog  0-15 units tid + hs Decadron  4 mg Q6 hours A1c 7.9% on 11/4 Glucose 338 this am.  Inpatient Diabetes Program Recommendations:    -   Add Semglee  15 units.  Thanks,  Clotilda Bull RN, MSN, BC-ADM Inpatient Diabetes Coordinator Team Pager [phone] (8a-5p)

## 2024-01-07 NOTE — Progress Notes (Signed)
 Patient was complaining of some SOB. Oxygen  Stat was noted to be 97% 3L [place]. Patient was offered PRN breathing treatment and patient declined.  Patient was offered her scheduled inhaler and patient declined.

## 2024-01-07 NOTE — Discharge Summary (Signed)
 Physician Discharge Summary   Patient: Kirsten [name] MRN: [id number] DOB: February 03, 1956  Admit date:     01/04/2024  Discharge date: 01/07/24  Discharge Physician: Cresencio Fairly   PCP: Joshua Debby CROME, MD   Recommendations at discharge:    F/up with outpt providers as requested  Discharge Diagnoses: Principal Problem:   Syncope and collapse Active Problems:   OSA on CPAP   Hypothyroidism   Essential hypertension, benign   Paroxysmal atrial fibrillation (HCC)   Type II diabetes mellitus with manifestations (HCC)   Hyperlipidemia with target LDL less than 100   Neck pain   Insomnia w/ sleep apnea   NASH (nonalcoholic steatohepatitis)   Hypoxia   Facet arthropathy, cervical  Hospital Course: Assessment and Plan:  68 y.o. female with medical history significant of atrial fibrillation, anxiety disorder, essential hypertension, GERD, hyperlipidemia, hypothyroidism, NASH cirrhosis morbid obesity, obstructive sleep apnea, type 2 diabetes, who started having significant neck pain and radiculopathy in the last few days    11/23: Neurosurgery and cardiology consult 11/24: Adding muscle rub cream, Flexeril  as she is still quite symptomatic   #1 syncope: Could be due to hypoxia.  She was hypoxic at the time of the syncopal episode and also on recheck in the Hospital.  Her oxygen  saturation dropped to mid 70s on minimal ambulation.  Patient is having significant cervical radiculopathy and could have had vasovagal syncope due to some neurologic release.    PT and OT recommends home health PT and OT - set up by [facility] Echocardiogram within normal limit, cardiology input appreciated -She remains in sinus rhythm with baseline left bundle branch block unchanged from prior EKGs. Initial troponins are unremarkable along with the EKG.    #2 cervical radiculopathy, spondylosis with facet arthropathy and associated torticollis Symptoms improving with Decadron , Flexeril  and prn percocet - Appreciate  neurosurgery input.  Discussed with interventional radiology by them.  Planning right C3-4 medial branch block soon as outpatient   #3 type 2 diabetes  #4 NASH cirrhosis #5 hyperlipidemia #6 paroxysmal atrial fibrillation #7 essential hypertension #8 obstructive sleep apnea: CPAP prn #9 hypothyroidism: Continue levothyroxine  #10 generalized anxiety disorder: Continue Xanax  and Paxil        Consultants: Cardio, Neurosurgery  Disposition: Home health Diet recommendation:  Discharge Diet Orders (From admission, onward)     Start     Ordered   01/07/24 0000  Diet - low sodium heart healthy        01/07/24 1209           Carb modified diet DISCHARGE MEDICATION: Allergies as of 01/07/2024       Reactions   Doxycycline  Rash   Factive [gemifloxacin Mesylate] Rash   Sulfonamide Derivatives Rash   REACTION: rash   Crestor  [rosuvastatin ]    GI upset   Gemifloxacin Rash   Penicillins Hives, Rash   Has patient had a PCN reaction causing immediate rash, facial/tongue/throat swelling, SOB or lightheadedness with hypotension: No Has patient had a PCN reaction causing severe rash involving mucus membranes or skin necrosis: No Has patient had a PCN reaction that required hospitalization No Has patient had a PCN reaction occurring within the last 10 years: No If all of the above answers are NO, then may proceed with Cephalosporin use.        Medication List     STOP taking these medications    acetaminophen  325 MG tablet Commonly known as: TYLENOL        TAKE these medications  albuterol  108 (90 Base) MCG/ACT inhaler Commonly known as: VENTOLIN  HFA Inhale 2 puffs into the lungs every 6 (six) hours as needed for wheezing or shortness of breath.   ALPRAZolam  0.25 MG tablet Commonly known as: XANAX  Take 1 tablet (0.25 mg total) by mouth 3 (three) times daily as needed for anxiety.   amiodarone  200 MG tablet Commonly known as: PACERONE  Take 1 tablet (200 mg  total) by mouth daily.   atorvastatin  40 MG tablet Commonly known as: LIPITOR TAKE ONE TABLET BY MOUTH ONE TIME DAILY   Blood Glucose Monitoring Suppl Devi 1 each by Does not apply route as directed. Dispense based on patient and insurance preference. Use up to four times daily as directed. (FOR ICD-10 E10.9, E11.9).   OneTouch Verio Flex System w/Device Kit 1 Act by Does not apply route in the morning, at noon, and at bedtime.   BLOOD GLUCOSE TEST STRIPS Strp 1 each by Does not apply route as directed. Dispense based on patient and insurance preference. Use up to four times daily as directed. (FOR ICD-10 E10.9, E11.9).   cyanocobalamin  1000 MCG/ML injection Commonly known as: VITAMIN B12 INJECT 1ML INTO THE MUSCLE ONCE A MONTH   cyclobenzaprine  5 MG tablet Commonly known as: FLEXERIL  Take 1 tablet (5 mg total) by mouth 3 (three) times daily as needed for up to 5 days for muscle spasms.   dexamethasone  4 MG tablet Commonly known as: DECADRON  Take 1 tablet (4 mg total) by mouth 2 (two) times daily for 3 days.   Eliquis  5 MG Tabs tablet Generic drug: apixaban  TAKE ONE TABLET BY MOUTH TWICE A DAY   fluticasone  50 MCG/ACT nasal spray Commonly known as: FLONASE  Place 1 spray into both nostrils daily.   fluticasone  furoate-vilanterol 100-25 MCG/ACT Aepb Commonly known as: Breo Ellipta  Inhale 1 puff into the lungs daily.   FreeStyle Libre 3 Plus Sensor Misc Apply 1 Act topically every 14 (fourteen) days. Change sensor every 15 days.   furosemide  20 MG tablet Commonly known as: LASIX  Take 20 mg by mouth daily.   Insulin  Pen Needle 32G X 6 MM Misc 1 Act by Does not apply route once a week.   Lancet Device Misc 1 each by Does not apply route as directed. Dispense based on patient and insurance preference. Use up to four times daily as directed. (FOR ICD-10 E10.9, E11.9).   Lancets Misc 1 each by Does not apply route as directed. Dispense based on patient and insurance  preference. Use up to four times daily as directed. (FOR ICD-10 E10.9, E11.9).   levothyroxine  150 MCG tablet Commonly known as: SYNTHROID  TAKE ONE TABLET BY MOUTH ONCE DAILY   losartan  25 MG tablet Commonly known as: COZAAR  Take 25 mg by mouth daily.   metFORMIN  750 MG 24 hr tablet Commonly known as: GLUCOPHAGE -XR TAKE ONE TABLET BY MOUTH ONE TIME DAILY WITH BREAKFAST   metoprolol  succinate 25 MG 24 hr tablet Commonly known as: TOPROL -XL Take 25 mg by mouth daily.   Muscle Rub 10-15 % Crea Apply 1 Application topically daily. Start taking on: January 08, 2024   oxyCODONE -acetaminophen  5-325 MG tablet Commonly known as: PERCOCET/ROXICET Take 1 tablet by mouth every 8 (eight) hours as needed for up to 5 days for severe pain (pain score 7-10) or moderate pain (pain score 4-6).   OXYGEN  Inhale 2 L into the lungs at bedtime. Uses with CPAP   Ozempic  (0.25 or 0.5 MG/DOSE) 2 MG/3ML Sopn Generic drug: Semaglutide (0.25 or 0.5MG /DOS) Inject  0.25 mg into the skin once a week.   PARoxetine  20 MG tablet Commonly known as: Paxil  Take 1 tablet (20 mg total) by mouth daily.   torsemide  20 MG tablet Commonly known as: DEMADEX  Take 20 mg by mouth as needed (Edema in legs).   Tresiba  FlexTouch 200 UNIT/ML FlexTouch Pen Generic drug: insulin  degludec Inject 20 Units into the skin daily.        Contact information for follow-up providers     Joshua Debby CROME, MD. Schedule an appointment as soon as possible for a visit in 3 day(s).   Specialty: Internal Medicine Why: [facility] Discharge F/UP Contact information: [address] [place] [address] [phone]         Tobie Tonita POUR, DO. Schedule an appointment as soon as possible for a visit in 2 week(s).   Specialty: Neurology Why: [facility] Discharge F/UP Contact information: [address] [place] [address] [phone]         Gregory Edsel Ruth, PA. Schedule an appointment as  soon as possible for a visit in 1 week(s).   Specialty: Neurosurgery Why: [facility] Discharge F/UP Contact information: [address] [place] [address] [phone]              Contact information for after-discharge care     Home Medical Care     [facility] .   Service: Home Health Services Contact information: [address] [place]  [phone]                    Discharge Exam: Filed Weights   01/04/24 1524 01/06/24 0500 01/07/24 0500  Weight: 90.7 kg 96.6 kg 98.1 kg   General exam: Appears calm and comfortable, right frontal hematoma seen Neck: improved range of motion with minimal tenderness over posterior neck  Respiratory system: Clear to auscultation. Respiratory effort normal. Cardiovascular system: S1 & S2 heard, RRR. No murmurs. No pedal edema. Gastrointestinal system: Abdomen is soft, benign Central nervous system: Alert and oriented. No focal neurological deficits. Extremities: Symmetric 5 x 5 power. Skin: No rashes, lesions or ulcers, right frontal hematoma Psychiatry: Judgement and insight appear normal. Mood & affect appropriate.   Condition at discharge: fair  The results of significant diagnostics from this hospitalization (including imaging, microbiology, ancillary and laboratory) are listed below for reference.   Imaging Studies: ECHOCARDIOGRAM COMPLETE Result Date: 01/06/2024    ECHOCARDIOGRAM REPORT   Patient Name:   SHAKEILA PFARR Date of Exam: 01/06/2024 Medical Rec #:  [id number]        Height:       67.0 in Accession #:    [id number]       Weight:       213.0 lb Date of Birth:  1956-02-09        BSA:          2.077 m Patient Age:    68 years         BP:           135/38 mmHg Patient Gender: F                HR:           76 bpm. Exam Location:  [facility] Procedure: 2D Echo, Color Doppler and Cardiac Doppler (Both Spectral and Color            Flow Doppler  were utilized during  procedure). Indications:     Syncope R55  History:         Patient has prior history of Echocardiogram examinations, most                  recent 09/30/2021. Arrythmias:Atrial Fibrillation; Risk                  Factors:Sleep Apnea and Hypertension.  Sonographer:     Christopher Furnace Referring Phys:  [id number] EMERY LITTIE FUSS Diagnosing Phys: Annalee Custovic  Sonographer Comments: Technically challenging study due to limited acoustic windows and no apical window. Best view is modified subcostal. IMPRESSIONS  1. Left ventricular ejection fraction, by estimation, is 60 to 65%. The left ventricle has normal function. The left ventricle has no regional wall motion abnormalities. Left ventricular diastolic parameters were normal.  2. Right ventricular systolic function is normal. The right ventricular size is normal.  3. The mitral valve is normal in structure. Mild mitral valve regurgitation. No evidence of mitral stenosis.  4. The aortic valve is normal in structure. Aortic valve regurgitation is not visualized. No aortic stenosis is present.  5. The inferior vena cava is normal in size with greater than 50% respiratory variability, suggesting right atrial pressure of 3 mmHg. FINDINGS  Left Ventricle: Left ventricular ejection fraction, by estimation, is 60 to 65%. The left ventricle has normal function. The left ventricle has no regional wall motion abnormalities. The left ventricular internal cavity size was normal in size. There is  no left ventricular hypertrophy. Left ventricular diastolic parameters were normal. Right Ventricle: The right ventricular size is normal. No increase in right ventricular wall thickness. Right ventricular systolic function is normal. Left Atrium: Left atrial size was normal in size. Right Atrium: Right atrial size was normal in size. Pericardium: There is no evidence of pericardial effusion. Mitral Valve: The mitral valve is normal in structure. Mild mitral valve  regurgitation. No evidence of mitral valve stenosis. MV peak gradient, 5.7 mmHg. The mean mitral valve gradient is 2.0 mmHg. Tricuspid Valve: The tricuspid valve is normal in structure. Tricuspid valve regurgitation is mild. Aortic Valve: The aortic valve is normal in structure. Aortic valve regurgitation is not visualized. No aortic stenosis is present. Aortic valve mean gradient measures 2.0 mmHg. Aortic valve peak gradient measures 3.5 mmHg. Aortic valve area, by VTI measures 3.21 cm. Pulmonic Valve: The pulmonic valve was normal in structure. Pulmonic valve regurgitation is not visualized. Aorta: The aortic root is normal in size and structure. Venous: The inferior vena cava is normal in size with greater than 50% respiratory variability, suggesting right atrial pressure of 3 mmHg. IAS/Shunts: No atrial level shunt detected by color flow Doppler.  LEFT VENTRICLE PLAX 2D LVIDd:         4.70 cm   Diastology LVIDs:         3.00 cm   LV e' medial:    7.07 cm/s LV PW:         0.70 cm   LV E/e' medial:  13.0 LV IVS:        0.90 cm   LV e' lateral:   12.40 cm/s LVOT diam:     2.00 cm   LV E/e' lateral: 7.4 LV SV:         61 LV SV Index:   29 LVOT Area:     3.14 cm LV IVRT:       77 msec  RIGHT VENTRICLE RV Basal diam:  3.80 cm RV  Mid diam:    3.30 cm LEFT ATRIUM           Index        RIGHT ATRIUM           Index LA diam:      3.20 cm 1.54 cm/m   RA Area:     21.30 cm LA Vol (A4C): 42.2 ml 20.32 ml/m  RA Volume:   71.80 ml  34.57 ml/m  AORTIC VALVE AV Area (Vmax):    3.06 cm AV Area (Vmean):   2.69 cm AV Area (VTI):     3.21 cm AV Vmax:           93.30 cm/s AV Vmean:          73.300 cm/s AV VTI:            0.189 m AV Peak Grad:      3.5 mmHg AV Mean Grad:      2.0 mmHg LVOT Vmax:         91.00 cm/s LVOT Vmean:        62.700 cm/s LVOT VTI:          0.193 m LVOT/AV VTI ratio: 1.02  AORTA Ao Root diam: 2.20 cm MITRAL VALVE               TRICUSPID VALVE MV Area (PHT): 4.29 cm    TR Peak grad:   49.0 mmHg MV Area  VTI:   2.42 cm    TR Vmax:        350.00 cm/s MV Peak grad:  5.7 mmHg MV Mean grad:  2.0 mmHg    SHUNTS MV Vmax:       1.19 m/s    Systemic VTI:  0.19 m MV Vmean:      66.6 cm/s   Systemic Diam: 2.00 cm MV Decel Time: 177 msec MV E velocity: 91.70 cm/s MV A velocity: 78.60 cm/s MV E/A ratio:  1.17 Designer, Multimedia signed by Annalee Casa Signature Date/Time: 01/06/2024/10:12:28 AM    Final    MR BRAIN WO CONTRAST Result Date: 01/04/2024 EXAM: MRI BRAIN WITH CONTRAST MRI CERVICAL SPINE WITHOUT CONTRAST 01/04/2024 10:51:13 PM TECHNIQUE: Multiplanar multisequence MRI of the brain was performed with the administration of 9 mL gadobutrol  (GADAVIST ) 1 MMOL/ML intravenous contrast. Multiplanar multisequence MRI of the cervical spine was performed without the administration of intravenous contrast. COMPARISON: Brain MRI dated 05/26/2021. Cervical spine MRI dated 01/28/2020. CLINICAL HISTORY: Syncope/presyncope, cerebrovascular cause suspected. FINDINGS: MRI BRAIN: BRAIN AND VENTRICLES: Multifocal hyperintense T2-weighted signal within the cerebral white matter, most commonly due to chronic small vessel disease. No acute infarct. No acute intracranial hemorrhage. No mass or abnormal enhancement. No midline shift. No hydrocephalus. The sella is unremarkable. Normal flow voids. ORBITS: No acute abnormality. SINUSES AND MASTOIDS: No acute abnormality. BONES AND SOFT TISSUES: Small right frontal scalp hematoma. Normal bone marrow signal. MRI CERVICAL SPINE: BONES AND ALIGNMENT: Reversal of normal cervical lordosis. Normal vertebral body heights. Marrow signal is unremarkable. No abnormal enhancement. SPINAL CORD: Normal spinal cord size. Normal spinal cord signal. SOFT TISSUES: Unremarkable. C2-C3: Severe left facet hypertrophy with moderate left foraminal stenosis unchanged. C3-C4: Mild left facet hypertrophy. C4-C5: Mild right uncovertebral spurring. C5-C6: Right greater than left uncovertebral hypertrophy  with right foraminal disc protrusion, moderate spinal canal stenosis, and severe right foraminal stenosis. C6-C7: Small disc bulge with uncovertebral hypertrophy and mild spinal canal stenosis. C7-T1: No significant disc herniation. No spinal canal stenosis or neural foraminal  narrowing. IMPRESSION: 1. No acute intracranial abnormality. 2. Multifocal hyperintense T2-weighted signal within the cerebral white matter, most commonly due to chronic small vessel disease. 3. Small right frontal scalp hematoma. 4. Severe left facet hypertrophy with moderate left foraminal stenosis at C3-4, unchanged. 5. Right greater than left uncovertebral hypertrophy with right foraminal disc protrusion, moderate spinal canal stenosis, and severe right foraminal stenosis at C5-6, unchanged. Electronically signed by: Franky Stanford MD 01/04/2024 11:07 PM EST RP Workstation: [id number]   MR CERVICAL SPINE W WO CONTRAST Result Date: 01/04/2024 EXAM: MRI BRAIN WITH CONTRAST MRI CERVICAL SPINE WITHOUT CONTRAST 01/04/2024 10:51:13 PM TECHNIQUE: Multiplanar multisequence MRI of the brain was performed with the administration of 9 mL gadobutrol  (GADAVIST ) 1 MMOL/ML intravenous contrast. Multiplanar multisequence MRI of the cervical spine was performed without the administration of intravenous contrast. COMPARISON: Brain MRI dated 05/26/2021. Cervical spine MRI dated 01/28/2020. CLINICAL HISTORY: Syncope/presyncope, cerebrovascular cause suspected. FINDINGS: MRI BRAIN: BRAIN AND VENTRICLES: Multifocal hyperintense T2-weighted signal within the cerebral white matter, most commonly due to chronic small vessel disease. No acute infarct. No acute intracranial hemorrhage. No mass or abnormal enhancement. No midline shift. No hydrocephalus. The sella is unremarkable. Normal flow voids. ORBITS: No acute abnormality. SINUSES AND MASTOIDS: No acute abnormality. BONES AND SOFT TISSUES: Small right frontal scalp hematoma. Normal bone marrow signal. MRI  CERVICAL SPINE: BONES AND ALIGNMENT: Reversal of normal cervical lordosis. Normal vertebral body heights. Marrow signal is unremarkable. No abnormal enhancement. SPINAL CORD: Normal spinal cord size. Normal spinal cord signal. SOFT TISSUES: Unremarkable. C2-C3: Severe left facet hypertrophy with moderate left foraminal stenosis unchanged. C3-C4: Mild left facet hypertrophy. C4-C5: Mild right uncovertebral spurring. C5-C6: Right greater than left uncovertebral hypertrophy with right foraminal disc protrusion, moderate spinal canal stenosis, and severe right foraminal stenosis. C6-C7: Small disc bulge with uncovertebral hypertrophy and mild spinal canal stenosis. C7-T1: No significant disc herniation. No spinal canal stenosis or neural foraminal narrowing. IMPRESSION: 1. No acute intracranial abnormality. 2. Multifocal hyperintense T2-weighted signal within the cerebral white matter, most commonly due to chronic small vessel disease. 3. Small right frontal scalp hematoma. 4. Severe left facet hypertrophy with moderate left foraminal stenosis at C3-4, unchanged. 5. Right greater than left uncovertebral hypertrophy with right foraminal disc protrusion, moderate spinal canal stenosis, and severe right foraminal stenosis at C5-6, unchanged. Electronically signed by: Franky Stanford MD 01/04/2024 11:07 PM EST RP Workstation: [id number]   CT Head Wo Contrast Result Date: 01/04/2024 EXAM: CT HEAD WITHOUT CONTRAST 01/04/2024 04:52:21 PM TECHNIQUE: CT of the head was performed without the administration of intravenous contrast. Automated exposure control, iterative reconstruction, and/or weight based adjustment of the mA/kV was utilized to reduce the radiation dose to as low as reasonably achievable. COMPARISON: Comparison with CT head without contrast 09/16/2021. CLINICAL HISTORY: Fall, on Eliquis . FINDINGS: BRAIN AND VENTRICLES: No acute hemorrhage. No evidence of acute infarct. No hydrocephalus. No extra-axial collection.  No mass effect or midline shift. Mild periventricular chronic small vessel ischemia. ORBITS: Bilateral cataract resection noted. SINUSES: No acute abnormality. SOFT TISSUES AND SKULL: Right frontal scalp hematoma. No underlying fracture or radiopaque foreign body. Atherosclerosis of skullbase vasculature. IMPRESSION: 1. No acute intracranial abnormality. 2. Right frontal scalp hematoma without underlying fracture or radiopaque foreign body. Electronically signed by: Lonni Necessary MD 01/04/2024 05:07 PM EST RP Workstation: [id number]   CT Cervical Spine Wo Contrast Result Date: 01/04/2024 EXAM: CT CERVICAL SPINE WITHOUT CONTRAST 01/04/2024 04:52:21 PM TECHNIQUE: CT of the cervical spine was performed without the administration of intravenous contrast.  Multiplanar reformatted images are provided for review. Automated exposure control, iterative reconstruction, and/or weight based adjustment of the mA/kV was utilized to reduce the radiation dose to as low as reasonably achievable. COMPARISON: CT cervical spine without contrast 12/19/2021. CLINICAL HISTORY: Syncopal episode. Trauma to head. FINDINGS: CERVICAL SPINE: BONES AND ALIGNMENT: No acute fracture or traumatic malalignment. DEGENERATIVE CHANGES: Asymmetric left-sided facet hypertrophy is present at C2-C3, C3-C4, and C7-T1. Moderate left foraminal stenosis is present at C2-C3 and C7-T1. Uncovertebral spurring contributes to moderate foraminal narrowing bilaterally at C6-C7, right greater than left. SOFT TISSUES: No prevertebral soft tissue swelling. Atherosclerotic calcifications are present at the left carotid bifurcation without significant stenosis. IMPRESSION: 1. No acute abnormality of the cervical spine related to the reported fall. 2. Moderate multilevel degenerative changes with foraminal stenosis as detailed, without severe central canal stenosis. Electronically signed by: Lonni Necessary MD 01/04/2024 05:05 PM EST RP Workstation: [id number]    CT Angio Chest PE W and/or Wo Contrast Result Date: 01/04/2024 CLINICAL DATA:  Chest pain.  Concern for pulmonary embolism. EXAM: CT ANGIOGRAPHY CHEST WITH CONTRAST TECHNIQUE: Multidetector CT imaging of the chest was performed using the standard protocol during bolus administration of intravenous contrast. Multiplanar CT image reconstructions and MIPs were obtained to evaluate the vascular anatomy. RADIATION DOSE REDUCTION: This exam was performed according to the departmental dose-optimization program which includes automated exposure control, adjustment of the mA and/or kV according to patient size and/or use of iterative reconstruction technique. CONTRAST:  75mL OMNIPAQUE  IOHEXOL  350 MG/ML SOLN COMPARISON:  Chest CT dated 02/01/2023. FINDINGS: Cardiovascular: Borderline cardiomegaly. No pericardial effusion. The thoracic aorta is unremarkable. The origins of the great vessels of the aortic arch are patent. Evaluation of the pulmonary arteries is limited due to respiratory motion. No pulmonary artery embolus identified. Mediastinum/Nodes: No hilar or mediastinal adenopathy. The esophagus is grossly unremarkable no mediastinal fluid collection. Lungs/Pleura: Right posterior diaphragmatic defect with herniation of abdominal fat. There are bibasilar atelectasis. No consolidative changes. There is no pleural effusion or pneumothorax. The central airways are patent. Upper Abdomen: No acute abnormality. Musculoskeletal: Osteopenia.  No acute osseous pathology. Review of the MIP images confirms the above findings. IMPRESSION: No acute intrathoracic pathology. No CT evidence of pulmonary artery embolus. Electronically Signed   By: Vanetta Chou M.D.   On: 01/04/2024 17:03    Microbiology: Results for orders placed or performed in visit on 10/01/23  CULTURE, URINE COMPREHENSIVE     Status: None   Collection Time: 10/01/23 11:35 AM   Specimen: Urine  Result Value Ref Range Status   Source: URINE  Final    Status: FINAL  Final   RESULT:   Final    Mixed genital flora isolated. These superficial bacteria are not indicative of a urinary tract infection. No further organism identification is warranted on this specimen. If clinically indicated, recollect clean-catch, mid-stream urine and transfer  immediately to Urine Culture Transport Tube.     Labs: CBC: Recent Labs  Lab 01/04/24 1528 01/05/24 0527 01/07/24 0431  WBC 8.0 12.3* 10.1  HGB 13.6 12.0 11.5*  HCT 42.5 40.3 37.0  MCV 90.6 96.2 93.7  PLT 257 290 219   Basic Metabolic Panel: Recent Labs  Lab 01/04/24 1528 01/05/24 0527 01/07/24 0431  NA 138 142 139  K 4.6 4.0 4.7  CL 102 106 104  CO2 25 29 28   GLUCOSE 275* 140* 246*  BUN 16 14 27*  CREATININE 0.84 0.86 0.83  CALCIUM  10.0 9.3 9.5  MG 2.0  --   --  Liver Function Tests: Recent Labs  Lab 01/04/24 1528 01/05/24 0527  AST 20 19  ALT 14 13  ALKPHOS 105 84  BILITOT 0.3 0.3  PROT 7.2 6.4*  ALBUMIN 4.4 4.1   CBG: Recent Labs  Lab 01/06/24 1619 01/06/24 2138 01/07/24 0557 01/07/24 0757 01/07/24 1125  GLUCAP 252* 266* 296* 338* 339*    Discharge time spent: greater than 30 minutes.  Signed: Cresencio Fairly, MD [facility] 01/07/2024

## 2024-01-08 ENCOUNTER — Other Ambulatory Visit: Payer: Self-pay | Admitting: Internal Medicine

## 2024-01-08 ENCOUNTER — Telehealth: Payer: Self-pay | Admitting: *Deleted

## 2024-01-08 DIAGNOSIS — M47812 Spondylosis without myelopathy or radiculopathy, cervical region: Secondary | ICD-10-CM

## 2024-01-08 DIAGNOSIS — J9611 Chronic respiratory failure with hypoxia: Secondary | ICD-10-CM | POA: Diagnosis not present

## 2024-01-08 DIAGNOSIS — M5412 Radiculopathy, cervical region: Secondary | ICD-10-CM

## 2024-01-08 NOTE — Transitions of Care (Post Inpatient/ED Visit) (Signed)
   01/08/2024  [name] MRN: [id number] DOB: [birth year]  Today's TOC FU Call Status: Today's TOC FU Call Status:: Unsuccessful Call (1st Attempt) Unsuccessful Call (1st Attempt) Date: 01/08/24  Attempted to reach the patient regarding the most recent Inpatient visit.  Left HIPAA compliant voice message   Follow Up Plan: Additional outreach attempts will be made to reach the patient to complete the Transitions of Care (Post Inpatient/ED visit) call.   Pls call/ message for questions,  Libbie Bartley Mckinney Mariea Mcmartin, RN, BSN, CCRN Alumnus RN Care Manager  Transitions of Care  [facility] - [facility] [phone]: direct office

## 2024-01-08 NOTE — Transitions of Care (Post Inpatient/ED Visit) (Signed)
 01/08/2024  [name] MRN: [id number] DOB: [birth year]  Today's TOC FU Call Status: Today's TOC FU Call Status:: Successful TOC FU Call Completed TOC FU Call Complete Date: 01/08/24  Patient's Name and Date of Birth confirmed. Name, DOB  Transition Care Management Follow-up Telephone Call Date of Discharge: 01/07/24 Discharge Facility: [facility]) Type of Discharge: Inpatient Admission Primary Inpatient Discharge Diagnosis:: syncope and collapsed/ hypoxia How have you been since you were released from the hospital?: Better Any questions or concerns?: No  Items Reviewed: Medications obtained,verified, and reconciled?: Yes (Medications Reviewed) Any new allergies since your discharge?: No Dietary orders reviewed?: No Do you have support at home?: Yes People in Home [RPT]: spouse Name of Support/Comfort Primary Source: [place]  Medications Reviewed Today: Medications Reviewed Today     Reviewed by Kennieth Cathlean DEL, RN (Case Manager) on 01/08/24 at 1451  Med List Status: <None>   Medication Order Taking? Sig Documenting Provider Last Dose Status Informant  albuterol  (VENTOLIN  HFA) 108 (90 Base) MCG/ACT inhaler [id number] Yes Inhale 2 puffs into the lungs every 6 (six) hours as needed for wheezing or shortness of breath. Kasa, Kurian, MD  Active Self  ALPRAZolam  (XANAX ) 0.25 MG tablet [id number] Yes Take 1 tablet (0.25 mg total) by mouth 3 (three) times daily as needed for anxiety. Joshua Debby CROME, MD  Active Self  amiodarone  (PACERONE ) 200 MG tablet [id number] Yes Take 1 tablet (200 mg total) by mouth daily. Fernand Denyse DELENA, MD  Active Self  atorvastatin  (LIPITOR) 40 MG tablet [id number] Yes TAKE ONE TABLET BY MOUTH ONE TIME DAILY Joshua Debby CROME, MD  Active Self  Blood Glucose Monitoring Suppl (ONETOUCH VERIO FLEX SYSTEM) w/Device KIT [id number] Yes 1 Act by Does not apply route in the morning, at noon, and at bedtime. Joshua Debby CROME, MD   Active Self  Blood Glucose Monitoring Suppl DEVI [id number] Yes 1 each by Does not apply route as directed. Dispense based on patient and insurance preference. Use up to four times daily as directed. (FOR ICD-10 E10.9, E11.9). Joshua Debby CROME, MD  Active Self  Continuous Glucose Sensor (FREESTYLE LIBRE 3 PLUS SENSOR) [place] [id number] Yes Apply 1 Act topically every 14 (fourteen) days. Change sensor every 15 days. Joshua Debby CROME, MD  Active Self  cyanocobalamin  (VITAMIN B12) 1000 MCG/ML injection [id number] Yes INJECT INTO THE MUSCLE ONCE A MONTH Joshua Debby CROME, MD  Active Self  cyclobenzaprine  (FLEXERIL ) 5 MG tablet [id number] Yes Take 1 tablet (5 mg total) by mouth 3 (three) times daily as needed for up to 5 days for muscle spasms. Maree Hue, MD  Active   dexamethasone  (DECADRON ) 4 MG tablet [id number] Yes Take 1 tablet (4 mg total) by mouth 2 (two) times daily for 3 days. Maree Hue, MD  Active   ELIQUIS  5 MG TABS tablet [id number] Yes TAKE ONE TABLET BY MOUTH TWICE A DAY Joshua Debby CROME, MD  Active Self  fluticasone  (FLONASE ) 50 MCG/ACT nasal spray [id number] Yes Place 1 spray into both nostrils daily. Caleen Qualia, MD  Active Self  fluticasone  furoate-vilanterol (BREO ELLIPTA ) 100-25 MCG/ACT AEPB [id number] Yes Inhale 1 puff into the lungs daily. Kasa, Kurian, MD  Active Self  furosemide  (LASIX ) 20 MG tablet [id number] Yes Take 20 mg by mouth daily. [provider]  Active Self  Glucose Blood (BLOOD GLUCOSE TEST STRIPS) STRP [id number] Yes 1 each by Does not apply route as directed. Dispense based on patient and insurance preference. Use up to  four times daily as directed. (FOR ICD-10 E10.9, E11.9). Joshua Debby CROME, MD  Active Self  insulin  degludec (TRESIBA  FLEXTOUCH) 200 UNIT/ML FlexTouch Pen [id number] Yes Inject 20 Units into the skin daily. Joshua Debby CROME, MD  Active Self  Insulin  Pen Needle 32G X 6 MM MISC [id number] Yes 1 Act by Does not apply route once a week. Joshua Debby CROME, MD   Active Self  Lancet Device MISC [id number] Yes 1 each by Does not apply route as directed. Dispense based on patient and insurance preference. Use up to four times daily as directed. (FOR ICD-10 E10.9, E11.9). Joshua Debby CROME, MD  Active Self  Lancets MISC [id number] Yes 1 each by Does not apply route as directed. Dispense based on patient and insurance preference. Use up to four times daily as directed. (FOR ICD-10 E10.9, E11.9). Joshua Debby CROME, MD  Active Self  levothyroxine  (SYNTHROID ) 150 MCG tablet [id number] Yes TAKE ONE TABLET BY MOUTH ONCE DAILY Joshua Debby CROME, MD  Active Self  losartan  (COZAAR ) 25 MG tablet [id number]  Take 25 mg by mouth daily. [provider]  Active Self  Menthol -Methyl Salicylate  (MUSCLE RUB) 10-15 % CREA [id number] Yes Apply 1 Application topically daily. Maree Hue, MD  Active   metFORMIN  (GLUCOPHAGE -XR) 750 MG 24 hr tablet [id number] Yes TAKE ONE TABLET BY MOUTH ONE TIME DAILY WITH OFILIA Joshua Debby CROME, MD  Active Self  metoprolol  succinate (TOPROL -XL) 25 MG 24 hr tablet [id number] Yes Take 25 mg by mouth daily. [provider]  Active Self  oxyCODONE -acetaminophen  (PERCOCET/ROXICET) 5-325 MG tablet [id number] Yes Take 1 tablet by mouth every 8 (eight) hours as needed for up to 5 days for severe pain (pain score 7-10) or moderate pain (pain score 4-6). Maree Hue, MD  Active   OXYGEN  [id number] Yes Inhale 2 L into the lungs at bedtime. Uses with CPAP [provider]  Active Self  PARoxetine  (PAXIL ) 20 MG tablet [id number] Yes Take 1 tablet (20 mg total) by mouth daily. Joshua Debby CROME, MD  Active Self  Semaglutide ,0.25 or 0.5MG /DOS, (OZEMPIC , 0.25 OR 0.5 MG/DOSE,) 2 MG/3ML SOPN [id number] Yes Inject 0.25 mg into the skin once a week. Joshua Debby CROME, MD  Active Self           Med Note GROVER, BURNARD GORMAN Kitchens Jan 06, 2024  3:01 PM) Not started yet  torsemide  (DEMADEX ) 20 MG tablet [id number] Yes Take 20 mg by mouth as needed (Edema in legs).  [provider]  Active Self            Home Care and Equipment/Supplies: Were Home Health Services Ordered?: Yes Name of Home Health Agency:: [facility] Has Agency set up a time to come to your home?: No EMR reviewed for Home Health Orders: Orders present/patient has not received call (refer to CM for follow-up) (Patient has number and will call them if they don't call her) Any new equipment or medical supplies ordered?: No  Functional Questionnaire: Do you need assistance with bathing/showering or dressing?: No Do you need assistance with meal preparation?: No Do you need assistance with eating?: No Do you have difficulty maintaining continence: No Do you need assistance with getting out of bed/getting out of a chair/moving?: No Do you have difficulty managing or taking your medications?: No  Follow up appointments reviewed: PCP Follow-up appointment confirmed?: No MD Provider Line Number:[phone] Given: Yes (Per patient she will call herself for appointment) Specialist Hospital Follow-up appointment confirmed?: Yes Date of Specialist  follow-up appointment?: 01/14/24 Follow-Up Specialty Provider:: Neurosurgery Glade Boys 12/02 3:00 Do you need transportation to your follow-up appointment?: No Do you understand care options if your condition(s) worsen?: Yes-patient verbalized understanding  SDOH Interventions Today    Flowsheet Row Most Recent Value  SDOH Interventions   Food Insecurity Interventions Intervention Not Indicated  Housing Interventions Intervention Not Indicated  Transportation Interventions Intervention Not Indicated  Utilities Interventions Intervention Not Indicated   Discussed and offered 30 day TOC program.  Patient declined.  The patient has been provided with contact information for the care management team and has been advised to call with any health -related questions or concerns.  The patient verbalized understanding with current plan of  care.  The patient is directed to their insurance card regarding availability of benefits coverage   Cathlean Headland BSN RN [facility] Care Management Coordinator [email or web address] Direct Dial: [phone]  Fax: [phone] Website: [email or web address]

## 2024-01-13 ENCOUNTER — Telehealth: Payer: Self-pay

## 2024-01-13 NOTE — Telephone Encounter (Signed)
 Copied from CRM #[id number]. Topic: Clinical - Medical Advice >> Jan 13, 2024 10:38 AM Charolett L wrote: Reason for CRM: Debbie from [facility] wanted to leave a message that the patient would be starting today for PT

## 2024-01-13 NOTE — Telephone Encounter (Signed)
 Copied from CRM #[id number]. Topic: Clinical - Home Health Verbal Orders >> Jan 13, 2024 10:43 AM Drema MATSU wrote: [facility] called patient to schedule physical therapy for patient and she advised that she does not need services.

## 2024-01-14 ENCOUNTER — Encounter: Payer: Self-pay | Admitting: Orthopedic Surgery

## 2024-01-14 ENCOUNTER — Ambulatory Visit: Admitting: Orthopedic Surgery

## 2024-01-14 VITALS — BP 120/78 | Ht 67.0 in | Wt 198.0 lb

## 2024-01-14 DIAGNOSIS — M47812 Spondylosis without myelopathy or radiculopathy, cervical region: Secondary | ICD-10-CM | POA: Diagnosis not present

## 2024-01-14 MED ORDER — METHOCARBAMOL 500 MG PO TABS: 500.0000 mg | ORAL_TABLET | Freq: Three times a day (TID) | ORAL | 0 refills | Status: AC | PRN

## 2024-01-14 NOTE — Patient Instructions (Signed)
 It was so nice to see you today. Thank you so much for coming in.    You have some wear and tear in your neck (arthritis) and I think this is causing your pain.   Stop the flexeril /cyclobenzaprine .   I sent a prescription for methocarbamol to help with muscle spasms. Use only as needed and be careful, this can make you sleepy.   Interventional Radiology is working on getting you schedule for a cervical injection. They should be calling you. Let me know if you don't hear anything by Friday/Monday.   We can revisit physical therapy after your injection if needed.   I will message you after the holidays to check on your progress.   Please do not hesitate to call if you have any questions or concerns. You can also message me in MyChart.   Glade Boys PA-C [phone]     The physicians and staff at [facility] are committed to providing excellent care. You may receive a survey asking for feedback about your experience at our office. We value you your feedback and appreciate you taking the time to to fill it out. The [facility] leadership team is also available to discuss your experience in person, feel free to contact us  [phone].

## 2024-01-15 ENCOUNTER — Encounter: Payer: Self-pay | Admitting: Orthopedic Surgery

## 2024-01-21 ENCOUNTER — Telehealth: Payer: Self-pay | Admitting: Pharmacist

## 2024-01-21 NOTE — Progress Notes (Signed)
 Pharmacy Quality Measure Review  This patient is appearing on a report for being at risk of failing the adherence measure for cholesterol (statin) medications this calendar year.   Medication: Atorvastatin  Last fill date: 10/01/23 for 90 day supply  Contacted patient - she reports she is taking daily and has supply remaining. She should have 1 refill remaining at the pharmacy. No further action needed.  Darrelyn Drum, PharmD, BCPS, CPP Clinical Pharmacist Practitioner [facility] [phone]

## 2024-02-11 ENCOUNTER — Other Ambulatory Visit: Payer: Self-pay | Admitting: Internal Medicine

## 2024-02-16 ENCOUNTER — Other Ambulatory Visit: Payer: Self-pay | Admitting: Internal Medicine

## 2024-02-16 DIAGNOSIS — F3341 Major depressive disorder, recurrent, in partial remission: Secondary | ICD-10-CM

## 2024-02-25 ENCOUNTER — Other Ambulatory Visit: Payer: Self-pay | Admitting: Internal Medicine

## 2024-02-25 DIAGNOSIS — E118 Type 2 diabetes mellitus with unspecified complications: Secondary | ICD-10-CM

## 2024-03-04 ENCOUNTER — Telehealth: Payer: Self-pay | Admitting: Pharmacy Technician

## 2024-03-04 ENCOUNTER — Other Ambulatory Visit (HOSPITAL_COMMUNITY): Payer: Self-pay

## 2024-03-04 NOTE — Telephone Encounter (Signed)
 Which dosage is she supposed to be on? I see both a 20mg  and 25mg  was sent in.

## 2024-03-04 NOTE — Telephone Encounter (Signed)
 Please advise Dr. Joshua comments.

## 2024-03-04 NOTE — Telephone Encounter (Signed)
 Pharmacy Patient Advocate Encounter   Received notification from [facility] KEY that prior authorization for PARoxetine  HCl ER 25MG  er tablets is required/requested.   Insurance verification completed.   The patient is insured through [facility] ADVANTAGE/RX ADVANCE.   Can you confirm that patient is on both the 20mg  tablets and the 25mg  er tablets?  CMM Key# [id number]

## 2024-03-04 NOTE — Telephone Encounter (Signed)
 She does not take both 25 CR is preferred

## 2024-03-05 ENCOUNTER — Other Ambulatory Visit (HOSPITAL_COMMUNITY): Payer: Self-pay

## 2024-03-05 ENCOUNTER — Encounter: Payer: Self-pay | Admitting: *Deleted

## 2024-03-05 NOTE — Telephone Encounter (Signed)
 Pharmacy Patient Advocate Encounter  Received notification from [facility] ADVANTAGE/RX ADVANCE that Prior Authorization for PARoxetine  HCl ER 25MG  er tablets has been APPROVED from 03/04/2024 to 02/11/2025.   PA #/Case ID/Reference #: [id number]  It appears that she has filled both the 20mg  and the 25mg  cr tabs earlier this month.

## 2024-03-05 NOTE — Progress Notes (Signed)
 Kirsten [name]                                          MRN: [id number]   03/05/2024   The [facility] Quality Team Specialist reviewed this patient medical record for the purposes of chart review for care gap closure. The following were reviewed: abstraction for care gap closure-controlling blood pressure.    [facility] Quality Team

## 2024-03-05 NOTE — Telephone Encounter (Signed)
 [facility] states she did pick up the 90 day supply of 20mg  tablets filled on the 4th. The 25mg  cr tabs has not been dispensed. They will need to order the 25mg  cr tabs and patient will need to reach out to them to have that filled.

## 2024-03-06 ENCOUNTER — Other Ambulatory Visit (HOSPITAL_COMMUNITY): Payer: Self-pay

## 2024-03-06 NOTE — Telephone Encounter (Signed)
 Patient has been made aware of Dr Joshua comments about which medication she's supposed to take and also the pharmacist comments

## 2024-03-06 NOTE — Telephone Encounter (Signed)
 Unable to speak with the patient. LMTRC

## 2024-03-12 ENCOUNTER — Telehealth: Payer: Self-pay | Admitting: Podiatry

## 2024-03-12 NOTE — Telephone Encounter (Signed)
 [facility] in [facility] pt has appt to [facility] and see Dr Janit 2/3

## 2024-03-16 ENCOUNTER — Ambulatory Visit: Admitting: Internal Medicine

## 2024-03-20 ENCOUNTER — Ambulatory Visit: Admitting: Podiatry

## 2024-03-24 ENCOUNTER — Ambulatory Visit: Admitting: Internal Medicine

## 2024-03-31 ENCOUNTER — Ambulatory Visit: Admitting: Podiatry

## 2024-04-01 ENCOUNTER — Ambulatory Visit: Admitting: Internal Medicine

## 2024-09-16 ENCOUNTER — Ambulatory Visit
